# Patient Record
Sex: Male | Born: 1965
Health system: Southern US, Community
[De-identification: ages and names within clinical notes are randomized; demographics above are authoritative.]

## PROBLEM LIST (undated history)

## (undated) ENCOUNTER — Emergency Department (HOSPITAL_COMMUNITY): Admission: EM | Payer: Medicaid Other | Source: Home / Self Care

## (undated) DIAGNOSIS — S83242A Other tear of medial meniscus, current injury, left knee, initial encounter: Secondary | ICD-10-CM

## (undated) DIAGNOSIS — R918 Other nonspecific abnormal finding of lung field: Secondary | ICD-10-CM

## (undated) DIAGNOSIS — I639 Cerebral infarction, unspecified: Secondary | ICD-10-CM

## (undated) DIAGNOSIS — K449 Diaphragmatic hernia without obstruction or gangrene: Secondary | ICD-10-CM

## (undated) DIAGNOSIS — S0990XA Unspecified injury of head, initial encounter: Secondary | ICD-10-CM

## (undated) DIAGNOSIS — K219 Gastro-esophageal reflux disease without esophagitis: Secondary | ICD-10-CM

## (undated) DIAGNOSIS — B1920 Unspecified viral hepatitis C without hepatic coma: Secondary | ICD-10-CM

## (undated) DIAGNOSIS — J439 Emphysema, unspecified: Secondary | ICD-10-CM

## (undated) DIAGNOSIS — I82409 Acute embolism and thrombosis of unspecified deep veins of unspecified lower extremity: Secondary | ICD-10-CM

## (undated) DIAGNOSIS — I10 Essential (primary) hypertension: Secondary | ICD-10-CM

## (undated) DIAGNOSIS — J9601 Acute respiratory failure with hypoxia: Secondary | ICD-10-CM

## (undated) HISTORY — DX: Other nonspecific abnormal finding of lung field: R91.8

## (undated) HISTORY — DX: Emphysema, unspecified: J43.9

## (undated) HISTORY — PX: FACIAL FRACTURE SURGERY: SHX1570

## (undated) HISTORY — DX: Essential (primary) hypertension: I10

---

## 2000-12-07 ENCOUNTER — Emergency Department (HOSPITAL_COMMUNITY): Admission: EM | Admit: 2000-12-07 | Discharge: 2000-12-07 | Payer: Self-pay | Admitting: Emergency Medicine

## 2001-06-26 ENCOUNTER — Emergency Department (HOSPITAL_COMMUNITY): Admission: EM | Admit: 2001-06-26 | Discharge: 2001-06-26 | Payer: Self-pay | Admitting: Emergency Medicine

## 2001-07-09 ENCOUNTER — Emergency Department (HOSPITAL_COMMUNITY): Admission: EM | Admit: 2001-07-09 | Discharge: 2001-07-09 | Payer: Self-pay | Admitting: Internal Medicine

## 2002-11-10 ENCOUNTER — Emergency Department (HOSPITAL_COMMUNITY): Admission: EM | Admit: 2002-11-10 | Discharge: 2002-11-10 | Payer: Self-pay | Admitting: Emergency Medicine

## 2004-05-20 ENCOUNTER — Ambulatory Visit: Payer: Self-pay | Admitting: Internal Medicine

## 2005-04-30 ENCOUNTER — Emergency Department (HOSPITAL_COMMUNITY): Admission: EM | Admit: 2005-04-30 | Discharge: 2005-04-30 | Payer: Self-pay | Admitting: Emergency Medicine

## 2013-11-23 ENCOUNTER — Encounter (HOSPITAL_COMMUNITY): Payer: Self-pay | Admitting: Emergency Medicine

## 2013-11-23 ENCOUNTER — Inpatient Hospital Stay (HOSPITAL_COMMUNITY)
Admission: EM | Admit: 2013-11-23 | Discharge: 2013-11-28 | DRG: 603 | Disposition: A | Discharge status: Home / Self Care | Payer: Self-pay | Attending: Internal Medicine | Admitting: Internal Medicine

## 2013-11-23 ENCOUNTER — Emergency Department (HOSPITAL_COMMUNITY): Payer: Self-pay

## 2013-11-23 DIAGNOSIS — Z79899 Other long term (current) drug therapy: Secondary | ICD-10-CM

## 2013-11-23 DIAGNOSIS — R945 Abnormal results of liver function studies: Secondary | ICD-10-CM

## 2013-11-23 DIAGNOSIS — L03119 Cellulitis of unspecified part of limb: Secondary | ICD-10-CM

## 2013-11-23 DIAGNOSIS — R7989 Other specified abnormal findings of blood chemistry: Secondary | ICD-10-CM

## 2013-11-23 DIAGNOSIS — B1710 Acute hepatitis C without hepatic coma: Secondary | ICD-10-CM

## 2013-11-23 DIAGNOSIS — B1920 Unspecified viral hepatitis C without hepatic coma: Secondary | ICD-10-CM | POA: Diagnosis present

## 2013-11-23 DIAGNOSIS — F17210 Nicotine dependence, cigarettes, uncomplicated: Secondary | ICD-10-CM | POA: Diagnosis present

## 2013-11-23 DIAGNOSIS — Z23 Encounter for immunization: Secondary | ICD-10-CM

## 2013-11-23 DIAGNOSIS — L03114 Cellulitis of left upper limb: Principal | ICD-10-CM | POA: Diagnosis present

## 2013-11-23 DIAGNOSIS — L0390 Cellulitis, unspecified: Secondary | ICD-10-CM | POA: Diagnosis present

## 2013-11-23 LAB — BASIC METABOLIC PANEL
ANION GAP: 10 (ref 5–15)
BUN: 11 mg/dL (ref 6–23)
CO2: 26 mEq/L (ref 19–32)
CREATININE: 0.89 mg/dL (ref 0.50–1.35)
Calcium: 9.4 mg/dL (ref 8.4–10.5)
Chloride: 102 mEq/L (ref 96–112)
GFR calc Af Amer: >90 mL/min (ref >90)
GFR calc non Af Amer: >90 mL/min (ref >90)
Glucose, Bld: 103 mg/dL — ABNORMAL HIGH (ref 70–99)
Potassium: 4.1 mEq/L (ref 3.7–5.3)
SODIUM: 138 meq/L (ref 137–147)

## 2013-11-23 LAB — CBC WITH DIFFERENTIAL/PLATELET
BASOS PCT: 0 % (ref 0–1)
Basophils Absolute: 0 10*3/uL (ref 0.0–0.1)
Eosinophils Absolute: 0.1 10*3/uL (ref 0.0–0.7)
Eosinophils Relative: 1 % (ref 0–5)
HCT: 44 % (ref 39.0–52.0)
Hemoglobin: 15.1 g/dL (ref 13.0–17.0)
LYMPHS ABS: 2.2 10*3/uL (ref 0.7–4.0)
Lymphocytes Relative: 16 % (ref 12–46)
MCH: 32.7 pg (ref 26.0–34.0)
MCHC: 34.3 g/dL (ref 30.0–36.0)
MCV: 95.2 fL (ref 78.0–100.0)
MONO ABS: 1.4 10*3/uL — AB (ref 0.1–1.0)
Monocytes Relative: 10 % (ref 3–12)
NEUTROS ABS: 10.4 10*3/uL — AB (ref 1.7–7.7)
Neutrophils Relative %: 73 % (ref 43–77)
PLATELETS: 327 10*3/uL (ref 150–400)
RBC: 4.62 MIL/uL (ref 4.22–5.81)
RDW: 14.1 % (ref 11.5–15.5)
WBC: 14.2 10*3/uL — AB (ref 4.0–10.5)

## 2013-11-23 MED ORDER — HYDROMORPHONE HCL 1 MG/ML IJ SOLN
1.0000 mg | Freq: Once | INTRAMUSCULAR | Status: AC
  Administered 2013-11-23: 1 mg via INTRAVENOUS
  Filled 2013-11-23: qty 1

## 2013-11-23 MED ORDER — ONDANSETRON HCL 4 MG/2ML IJ SOLN
4.0000 mg | Freq: Once | INTRAMUSCULAR | Status: AC
  Administered 2013-11-23: 4 mg via INTRAMUSCULAR
  Filled 2013-11-23: qty 2

## 2013-11-23 MED ORDER — VANCOMYCIN HCL IN DEXTROSE 1-5 GM/200ML-% IV SOLN
1000.0000 mg | Freq: Two times a day (BID) | INTRAVENOUS | Status: DC
  Administered 2013-11-23 – 2013-11-24 (×3): 1000 mg via INTRAVENOUS
  Filled 2013-11-23 (×7): qty 200

## 2013-11-23 MED ORDER — PIPERACILLIN-TAZOBACTAM IN DEX 2-0.25 GM/50ML IV SOLN
2.2500 g | Freq: Three times a day (TID) | INTRAVENOUS | Status: DC
  Administered 2013-11-23 – 2013-11-24 (×2): 2.25 g via INTRAVENOUS
  Filled 2013-11-23 (×9): qty 50

## 2013-11-23 MED ORDER — IOHEXOL 300 MG/ML  SOLN
100.0000 mL | Freq: Once | INTRAMUSCULAR | Status: AC | PRN
  Administered 2013-11-23: 100 mL via INTRAVENOUS

## 2013-11-23 MED ORDER — PIPERACILLIN-TAZOBACTAM 3.375 G IVPB 30 MIN
3.3750 g | Freq: Once | INTRAVENOUS | Status: AC
  Administered 2013-11-23: 3.375 g via INTRAVENOUS
  Filled 2013-11-23: qty 50

## 2013-11-23 MED ORDER — PIPERACILLIN-TAZOBACTAM IN DEX 2-0.25 GM/50ML IV SOLN
INTRAVENOUS | Status: AC
  Filled 2013-11-23: qty 50

## 2013-11-23 MED ORDER — VANCOMYCIN HCL IN DEXTROSE 1-5 GM/200ML-% IV SOLN
INTRAVENOUS | Status: AC
  Filled 2013-11-23: qty 200

## 2013-11-23 MED ORDER — HYDROMORPHONE HCL 1 MG/ML IJ SOLN
1.0000 mg | INTRAMUSCULAR | Status: DC | PRN
  Administered 2013-11-23 – 2013-11-24 (×6): 1 mg via INTRAVENOUS
  Filled 2013-11-23 (×6): qty 1

## 2013-11-23 MED ORDER — ONDANSETRON HCL 4 MG/2ML IJ SOLN: 4.0000 mg | Freq: Four times a day (QID) | INTRAMUSCULAR | Status: DC | PRN

## 2013-11-23 MED ORDER — PNEUMOCOCCAL VAC POLYVALENT 25 MCG/0.5ML IJ INJ
0.5000 mL | INJECTION | INTRAMUSCULAR | Status: AC
  Administered 2013-11-24: 0.5 mL via INTRAMUSCULAR
  Filled 2013-11-23: qty 0.5

## 2013-11-23 MED ORDER — SODIUM CHLORIDE 0.9 % IV SOLN
INTRAVENOUS | Status: DC
  Administered 2013-11-23 – 2013-11-24 (×2): via INTRAVENOUS

## 2013-11-23 MED ORDER — ONDANSETRON HCL 4 MG PO TABS: 4.0000 mg | ORAL_TABLET | Freq: Four times a day (QID) | ORAL | Status: DC | PRN

## 2013-11-23 MED ORDER — ENOXAPARIN SODIUM 40 MG/0.4ML ~~LOC~~ SOLN
40.0000 mg | SUBCUTANEOUS | Status: DC
  Administered 2013-11-23: 40 mg via SUBCUTANEOUS
  Filled 2013-11-23: qty 0.4

## 2013-11-23 MED ORDER — HYDROMORPHONE HCL 1 MG/ML IJ SOLN
INTRAMUSCULAR | Status: AC
  Administered 2013-11-23: 17:00:00
  Filled 2013-11-23: qty 1

## 2013-11-23 NOTE — ED Notes (Signed)
Lt hand and forearm swollen , hot , tender.  Bitten by ?spider 6 days ago.  To wrist.Pt says pus coming out of site of bite.  Good radial pulse..Marland Kitchen

## 2013-11-23 NOTE — ED Provider Notes (Signed)
CSN: [id number]     Arrival date & time 11/23/13  1200 History   First MD Initiated Contact with Patient 11/23/13 1341     Chief Complaint  Patient presents with  . Insect Bite     (Consider location/radiation/quality/duration/timing/severity/associated sxs/prior Treatment) HPI Comments: Patient is a 48 year old male who presents to the emergency department with the complaint of swelling of his left hand and forearm. Patient states that on Saturday, October 10 he was bitten by something. He is not sure what he was bit by. He had some pain on Saturday night, the pain continued to progress up until this time. On yesterday the patient states that he had severe pain and could hardly use his left hand. Today the pain was worse. He states that he soak the area and very warm or and got some mild drainage from the bite site. No other bites reported. The patient states that he has noticed gradual swelling from his back of his hand to his forearm near his elbow. He has had chills,  he has not measured a temperature elevation. He has tried over-the-counter medications for his pain, but states this is not helping at all.  The history is provided by the patient.    History reviewed. No pertinent past medical history. Past Surgical History  Procedure Laterality Date  . Facial fracture surgery     History reviewed. No pertinent family history. History  Substance Use Topics  . Smoking status: Current Every Day Smoker -- 2.00 packs/day  . Smokeless tobacco: Not on file  . Alcohol Use: No    Review of Systems  Constitutional: Positive for chills. Negative for fever and activity change.       All ROS Neg except as noted in HPI  HENT: Negative for nosebleeds.   Eyes: Negative for photophobia and discharge.  Respiratory: Negative for cough, shortness of breath and wheezing.   Cardiovascular: Negative for chest pain and palpitations.  Gastrointestinal: Negative for abdominal pain and blood in stool.    Genitourinary: Negative for dysuria, frequency and hematuria.  Musculoskeletal: Negative for arthralgias, back pain and neck pain.  Skin: Positive for wound.  Neurological: Negative for dizziness, seizures and speech difficulty.  Psychiatric/Behavioral: Negative for hallucinations and confusion.      Allergies  Review of patient's allergies indicates not on file.  Home Medications   Prior to Admission medications   Not on File   BP 146/98  Pulse 100  Temp(Src) 99.1 F (37.3 C) (Oral)  Resp 16  Ht 5\' 10"  (1.778 m)  Wt 165 lb (74.844 kg)  BMI 23.68 kg/m2  SpO2 99% Physical Exam  Nursing note and vitals reviewed. Constitutional: He is oriented to person, place, and time. He appears well-developed and well-nourished.  Non-toxic appearance.  HENT:  Head: Normocephalic.  Right Ear: Tympanic membrane and external ear normal.  Left Ear: Tympanic membrane and external ear normal.  Eyes: EOM and lids are normal. Pupils are equal, round, and reactive to light.  Neck: Normal range of motion. Neck supple. Carotid bruit is not present.  Cardiovascular: Regular rhythm, normal heart sounds, intact distal pulses and normal pulses.  Tachycardia present.   Pulmonary/Chest: Breath sounds normal. No respiratory distress.  Abdominal: Soft. Bowel sounds are normal. There is no tenderness. There is no guarding.  Musculoskeletal: Normal range of motion.  There is good range of motion of the left shoulder and elbow. There is swelling of from the forearm to the PIP joints of the fingers of the  left hand. There is significant swelling of the dorsum of the left hand and wrist. There is an open wound to the radial aspect of the left wrist with minimal drainage. There is increased redness and warmth of the wrist and forearm, with significant tenderness. Radial pulses 2+. Brachial pulses 2+.  Lymphadenopathy:       Head (right side): No submandibular adenopathy present.       Head (left side): No  submandibular adenopathy present.    He has no cervical adenopathy.  Neurological: He is alert and oriented to person, place, and time. He has normal strength. No cranial nerve deficit or sensory deficit.  Skin: Skin is warm and dry.  Psychiatric: He has a normal mood and affect. His speech is normal.        ED Course Case reviewed by Dr [name]. IV Zosyn and IV dilaudid ordered. Case discussed with hospitalist  - Dr Irene LimboGoodrich. CT with contrast ordered.  No drainable abscess noted on CT. Dr Sharl MaLama will admit the patient.  Procedures (including critical care time) Labs Review Labs Reviewed  CBC WITH DIFFERENTIAL - Abnormal; Notable for the following:    WBC 14.2 (*)    Neutro Abs 10.4 (*)    Monocytes Absolute 1.4 (*)    All other components within normal limits  BASIC METABOLIC PANEL - Abnormal; Notable for the following:    Glucose, Bld 103 (*)    All other components within normal limits    Imaging Review No results found.   EKG Interpretation None      MDM  Temperature is 99.1, pulse 100, blood pressure slightly elevated at 146/98. The examination is consistent with a cellulitis involving the left hand wrist/forearm. The complete blood count reveals a white blood cell count 14,200 elevated. The basic metabolic panel is well within normal limits. The CT of the upper extremity with contrast shows no osseous abnormalities. There is no evidence of osteomyelitis. There is no foreign body appreciated, and no soft tissue gas. There is no consolidation of fluids consistent with abscess. There is severe diffuse cellulitis.  IV antibiotics ordered. Pt to be admitted.   Final diagnoses:  None    *I have reviewed nursing notes, vital signs, and all appropriate lab and imaging results for this patient.Kathie Dike**    Zaccary Creech M Shabana Armentrout, PA-C 11/25/13 2130

## 2013-11-23 NOTE — Progress Notes (Signed)
ANTIBIOTIC CONSULT NOTE  Pharmacy Consult for Vancomycin and Zosyn  Indication: cellulitis   No Known Allergies  Patient Measurements: Height: 5\' 10"  (177.8 cm) Weight: 165 lb (74.844 kg) IBW/kg (Calculated) : 73  Vital Signs: Temp: 98.4 F (36.9 C) (10/16 1829) Temp Source: Oral (10/16 1829) BP: 123/74 mmHg (10/16 1829) Pulse Rate: 85 (10/16 1829) Intake/Output from previous day:   Intake/Output from this shift:    Labs:  Recent Labs  11/23/13 1244  WBC 14.2*  HGB 15.1  PLT 327  CREATININE 0.89   Estimated Creatinine Clearance: 103.7 ml/min (by C-G formula based on Cr of 0.89). No results found for this basename: VANCOTROUGH, VANCOPEAK, VANCORANDOM, GENTTROUGH, GENTPEAK, GENTRANDOM, TOBRATROUGH, TOBRAPEAK, TOBRARND, AMIKACINPEAK, AMIKACINTROU, AMIKACIN,  in the last 72 hours   Microbiology: No results found for this or any previous visit (from the past 720 hour(s)).  Anti-infectives   Start     Dose/Rate Route Frequency Ordered Stop   11/23/13 1430  piperacillin-tazobactam (ZOSYN) IVPB 3.375 g     3.375 g 100 mL/hr over 30 Minutes Intravenous  Once 11/23/13 1422 11/23/13 1530     Assessment: Okay for Protocol, potential recent spider bite.  Goal of Therapy:  Vancomycin trough level 10-15 mcg/ml  Plan:  Zosyn 3.375gm IV every 8 hours. Follow-up micro data, labs, vitals.  Vancomycin 1gm IV every 12 hours. Measure antibiotic drug levels at steady state Follow up culture results  Mady GemmaHayes, [place] R 11/23/2013,8:26 PM

## 2013-11-23 NOTE — ED Notes (Signed)
Report called to [name] on 3A , pt taken to 301

## 2013-11-23 NOTE — ED Notes (Signed)
Pt reports was "bitten by something last Saturday." pt denies any fever,n/v,abdominal pain. nad noted. Localized swelling/redness noted to bite site. Dried drainage noted to site. Distal pulses strong. Equal warmth noted in LUE. Bite site noted to side of left wrist.

## 2013-11-23 NOTE — ED Notes (Signed)
Alert, Nad, repeat pain med given

## 2013-11-23 NOTE — H&P (Signed)
PCP:   No primary provider on file.   Chief Complaint:  Left hand swelling and pain  HPI: 48 year old male with no significant medical problems, who came to the ED with swelling of left hand. Patient says that on Saturday while taking off his shoes at picnic table he was bitten by something, not sure whether it was spider. Yesterday patient started having pain and the swelling started worsening. Today he noticed that the swelling gradually became worse and he was unable to bend his fingers. He denies fever, no chills, he denies numbness or tingling in the hand. The pain gets worse in the hand is lowered, and says the pain is 10 out of 10 in intensity. In the ED patient was found to have a limited white count, CT left arm and hand was done which shows severe diffuse cellulitis and no specific evidence for necrotizing fasciitis.  Allergies:  No Known Allergies   History reviewed. No pertinent past medical history.  Past Surgical History  Procedure Laterality Date  . Facial fracture surgery      Prior to Admission medications   Medication Sig Start Date End Date Taking? Authorizing Provider  diphenhydrAMINE (BENADRYL) 25 mg capsule Take 50 mg by mouth every 6 (six) hours as needed for itching.   Yes Historical Provider, MD  ranitidine (ZANTAC) 75 MG tablet Take 75 mg by mouth 2 (two) times daily.   Yes Historical Provider, MD    Social History:  reports that he has been smoking.  He does not have any smokeless tobacco history on file. He reports that he does not drink alcohol or use illicit drugs.  History reviewed. No pertinent family history.   All the positives are listed in BOLD  Review of Systems:  HEENT: Headache, blurred vision, runny nose, sore throat Neck: Hypothyroidism, hyperthyroidism,,lymphadenopathy Chest : Shortness of breath, history of COPD, Asthma Heart : Chest pain, history of coronary arterey disease GI:  Nausea, vomiting, diarrhea, constipation, GERD GU:  Dysuria, urgency, frequency of urination, hematuria Neuro: Stroke, seizures, syncope Psych: Depression, anxiety, hallucinations   Physical Exam: Blood pressure 116/85, pulse 82, temperature 98.6 F (37 C), temperature source Oral, resp. rate 18, height 5\' 10"  (1.778 m), weight 74.844 kg (165 lb), SpO2 100.00%. Constitutional:   Patient is a well-developed and well-nourished *male in no acute distress and cooperative with exam. Head: Normocephalic and atraumatic Mouth: Mucus membranes moist Eyes: PERRL, EOMI, conjunctivae normal Neck: Supple, No Thyromegaly Cardiovascular: RRR, S1 normal, S2 normal Pulmonary/Chest: CTAB, no wheezes, rales, or rhonchi Abdominal: Soft. Non-tender, non-distended, bowel sounds are normal, no masses, organomegaly, or guarding present.  Neurological: A&O x3, Strenght is normal and symmetric bilaterally, cranial nerve II-XII are grossly intact, no focal motor deficit, sensory intact to light touch bilaterally.  Extremities : Left hand has significant edema, with open area noted on the ulnar aspect just above the wrist. Positive tenderness to palpation. Range of motion is limited due to swelling, sensations are intact.  Labs on Admission:  Basic Metabolic Panel:  Recent Labs Lab 11/23/13 1244  NA 138  K 4.1  CL 102  CO2 26  GLUCOSE 103*  BUN 11  CREATININE 0.89  CALCIUM 9.4   Liver Function Tests: No results found for this basename: AST, ALT, ALKPHOS, BILITOT, PROT, ALBUMIN,  in the last 168 hours No results found for this basename: LIPASE, AMYLASE,  in the last 168 hours No results found for this basename: AMMONIA,  in the last 168 hours CBC:  Recent Labs Lab 11/23/13 1244  WBC 14.2*  NEUTROABS 10.4*  HGB 15.1  HCT 44.0  MCV 95.2  PLT 327    Radiological Exams on Admission: Ct Extrem Up Entire Arm L W/cm  11/23/2013   CLINICAL DATA:  Initial evaluation for spider bite 2 left wrist and thumb 6 days ago, with swelling to left hand left  wrist left forearm, redness and drainage from the bite area, findings consistent with cellulitis  EXAM: CT OF THE UPPER LEFT ARM WITH CONTRAST  TECHNIQUE: Spiral imaging of the upper extremity from the level of the elbow through the hand following intravenous contrast administration  CONTRAST:  100mL OMNIPAQUE IOHEXOL 300 MG/ML  SOLN  COMPARISON:  None.  FINDINGS: There are no osseous abnormalities. There is no evidence of osteomyelitis or periosteal reaction. There is severe soft tissue edematous and inflammatory change over the dorsal aspect of the metacarpals extending into the wrist and throughout the entire forearm. There is no focal fluid collection. To a lesser degree inflammatory change extends into the fingers. No opaque foreign bodies identified.No soft tissue gas. There is evidence of inflammatory change extending into the intramuscular compartments of the forearm and wrist.  IMPRESSION: Severe diffuse cellulitis.No specific evidence for necrotizing fasciitis, although that is a difficult possibility to exclude radiographically.   Electronically Signed   By: Esperanza Heiraymond  Rubner M.D.   On: 11/23/2013 16:21      Assessment/Plan Active Problems:   Cellulitis  Cellulitis of left hand CT scan does not show any abscess, on my exam patient does not have sensory loss. We'll admit the patient and start vancomycin and Zosyn. Blood cultures x2 will be obtained. Once patient starts improving, he can be switched to oral antibiotics.  Code status: Patient is full code  Family discussion: No family at bedside   Time Spent on Admission: 60 minutes  Shalita Notte S [facility] Pager: [phone] 11/23/2013, 6:03 PM  If 7PM-7AM, please contact night-coverage  [email or web address]  Password [email or web address]

## 2013-11-24 ENCOUNTER — Encounter (HOSPITAL_COMMUNITY): Payer: Self-pay

## 2013-11-24 LAB — COMPREHENSIVE METABOLIC PANEL
ALK PHOS: 96 U/L (ref 39–117)
ALT: 212 U/L — AB (ref 0–53)
AST: 136 U/L — ABNORMAL HIGH (ref 0–37)
Albumin: 3 g/dL — ABNORMAL LOW (ref 3.5–5.2)
Anion gap: 9 (ref 5–15)
BUN: 9 mg/dL (ref 6–23)
CALCIUM: 8.9 mg/dL (ref 8.4–10.5)
CO2: 26 meq/L (ref 19–32)
Chloride: 103 mEq/L (ref 96–112)
Creatinine, Ser: 0.87 mg/dL (ref 0.50–1.35)
GFR calc non Af Amer: >90 mL/min (ref >90)
GLUCOSE: 85 mg/dL (ref 70–99)
POTASSIUM: 4 meq/L (ref 3.7–5.3)
Sodium: 138 mEq/L (ref 137–147)
Total Bilirubin: 0.6 mg/dL (ref 0.3–1.2)
Total Protein: 6.5 g/dL (ref 6.0–8.3)

## 2013-11-24 LAB — CBC
HCT: 41.1 % (ref 39.0–52.0)
HEMOGLOBIN: 14.1 g/dL (ref 13.0–17.0)
MCH: 32.8 pg (ref 26.0–34.0)
MCHC: 34.3 g/dL (ref 30.0–36.0)
MCV: 95.6 fL (ref 78.0–100.0)
Platelets: 306 10*3/uL (ref 150–400)
RBC: 4.3 MIL/uL (ref 4.22–5.81)
RDW: 14 % (ref 11.5–15.5)
WBC: 10.8 10*3/uL — ABNORMAL HIGH (ref 4.0–10.5)

## 2013-11-24 LAB — HEPATITIS PANEL, ACUTE
HCV Ab: REACTIVE — AB
Hep A IgM: NONREACTIVE
Hep B C IgM: NONREACTIVE
Hepatitis B Surface Ag: NEGATIVE

## 2013-11-24 LAB — HIV ANTIBODY (ROUTINE TESTING W REFLEX): HIV 1&2 Ab, 4th Generation: NONREACTIVE

## 2013-11-24 MED ORDER — PIPERACILLIN-TAZOBACTAM IN DEX 2-0.25 GM/50ML IV SOLN
INTRAVENOUS | Status: AC
  Filled 2013-11-24: qty 50

## 2013-11-24 MED ORDER — ACETAMINOPHEN 325 MG PO TABS
650.0000 mg | ORAL_TABLET | Freq: Four times a day (QID) | ORAL | Status: DC | PRN
  Administered 2013-11-26 (×2): 650 mg via ORAL
  Filled 2013-11-24 (×2): qty 2

## 2013-11-24 MED ORDER — HYDROMORPHONE HCL 1 MG/ML IJ SOLN
1.0000 mg | INTRAMUSCULAR | Status: DC | PRN
  Administered 2013-11-24 – 2013-11-26 (×11): 1 mg via INTRAVENOUS
  Filled 2013-11-24 (×11): qty 1

## 2013-11-24 MED ORDER — PIPERACILLIN-TAZOBACTAM 3.375 G IVPB
3.3750 g | Freq: Three times a day (TID) | INTRAVENOUS | Status: DC
  Administered 2013-11-24 – 2013-11-28 (×10): 3.375 g via INTRAVENOUS
  Filled 2013-11-24 (×19): qty 50

## 2013-11-24 NOTE — Progress Notes (Signed)
301- Carollee MassedEnglish, [place]-   Report was given to Jarrett Ablesachel Muthomi, RN @ 19:41. [facility] by [facility].

## 2013-11-24 NOTE — Plan of Care (Signed)
Pt arrived from [facility] to see. [facility] made aware of arrival in event needs to physically see pt. RN stated pt with inadequate pain control on current dose of Dilaudid so dose increased to 1mg  every 2 hours with orders to hold if sedated.  Junious SilkAllison Ellis ANP

## 2013-11-24 NOTE — Plan of Care (Signed)
Problem: Discharge Progression Outcomes Goal: Pain controlled with appropriate interventions Outcome: Completed/Met Date Met:  11/24/13 Pt pain controlled with PRN medication (dilaudid) IV. When PRN is taken, pain score (0-10) decreases by 2 points or more.

## 2013-11-24 NOTE — Progress Notes (Signed)
Utilization review Completed Antrice Pal RN BSN   

## 2013-11-24 NOTE — Progress Notes (Signed)
[facility] progress note. Chief complaint. Transfer note. History of present illness. This 48 year old male at [facility] with cellulitis left hand or wrist, and forearm without evidence of complicating features her CT scan. Suspected abscess development on her aspect of the forearm. Patient felt to require transfer to [facility] to allow consult with Doctor Izora Ribasoley of hand surgery. The patient has now arrived in transfer and I'm seeing him at bedside to ensure he remained clinically stable and that his orders transferred appropriately. Patient has no current complaints. Physical exam. Vital signs. Temperature 90.8, pulse 80, respiration 18, blood pressure 124/81. O2 sats 98%. General appearance. Well-developed male who is alert and in no distress. Cardiac. Rate and rhythm regular. Lungs. Breath sounds clear and equal. Abdomen. Soft with positive bowel sounds. No pain. Extremities. Left upper extremity presents with a gauze wrap manage over the hand and forearm. There is some edema in the fingers and erythema extending beyond the gauze wrap towards the elbow. Patient's range of motion strength is intact. Capillary refill is intact. No current evidence of compartment syndrome. Impression/plan. Problem #1. Cellulitis left hand, wrist, forearm. Ongoing antibiotic therapy with vancomycin. Patient will receive consult by hand surgery in the a.m. No evidence of compartment syndrome. Problem #2. Elevated transaminases. Continue to monitor. Workup in process hepatitis panel and HIV screen. Patient is clinically stable post transfer. All orders appear to have transferred appropriately.

## 2013-11-24 NOTE — Progress Notes (Addendum)
PROGRESS NOTE  Carollee MassedJames Inabinet ZOX:[id number] DOB: 06/05/1965 DOA: 11/23/2013 PCP: No primary provider on file.  Summary: 48yom presented with left lower arm and hand pain x1 week after bug or spider bite. Noted to have severe swelling and cellulitis in ED. Imaging without complicating features, admitted for IV abx.   Assessment/Plan: 1. Cellulitis left hand, wrist, forearm without evidence of complicating features on CT. No subjective improvement. Suspect abscess developing ulnar aspect of forearm. No signs of compartment syndrome but concern for extension into hand. 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 Per patient "only thing that works is gasoline". 2. Elevated transminases   Continue IV abx, elevate arm and hand. Obtain culture. Will discuss with hand surgeon for further recommendations--discussed with Dr. Izora Ribasoley hand surgery, [facility]. He will see in consult. NPO for now.  Check hepatitis panel, HIV screen  Code Status: full code DVT prophylaxis: heparin Family Communication: none present Disposition Plan: home  Brendia Sacksaniel Goodrich, MD  [facility]  Pager [phone] If 7PM-7AM, please contact night-coverage at [email or web address], password [email or web address] 11/24/2013, 1:05 PM  LOS: 1 day   Consultants:    Procedures:    Antibiotics:  Vancomycin 10/16 >>  Zosyn 10/16 >>  HPI/Subjective: No change in hand swelling or forearm swelling. Has significant pain lower forearm, wrist and hand. No pain upper arm. Area over wrist increasing in size, draining fluid. Sensation intact in arm and hand, ROM limited by swelling an pain.  Objective: Filed Vitals:   11/23/13 1702 11/23/13 1829 11/23/13 2131 11/24/13 0624  BP: 116/85 123/74 122/87 115/77  Pulse: 82 85 78 83  Temp: 98.6 F (37 C) 98.4 F (36.9 C) 98.2 F (36.8 C) 98.1 F (36.7 C)  TempSrc: Oral Oral Oral Oral  Resp: 18 18 16 20   Height:      Weight:   73.1 kg  (161 lb 2.5 oz)   SpO2: 100% 98% 99% 97%    Intake/Output Summary (Last 24 hours) at 11/24/13 1305 Last data filed at 11/24/13 0900  Gross per 24 hour  Intake   1140 ml  Output      0 ml  Net   1140 ml     Filed Weights   11/23/13 1212 11/23/13 2131  Weight: 74.844 kg (165 lb) 73.1 kg (161 lb 2.5 oz)    Exam:     Afebrile, VSS, no hypoxia   General: appears calm and comfortable  Psych: alert, speech fluent and clear  CV: RRR no m/r/g. No LE edema  Respiratory: CTA bilaterally no w/r/r. Normal resp effort  Left arm. Shoulder and upper arm unremarkable in appearance, non-tender. Elbow unremarkable. Slight erythema of forearm. Hand discolored secondary to occupation. Ulnar aspect proximal to wrist appears to be bigger in size compared to pictures from yesterday, tender, fluctuant, draining pus. Hand edematous, no erythema, ROM all fingers appears intact, limited by swelling, no pain in hands. Sensation intact at finger tips. Strong radial pulse. Arm and hand pink.            Data Reviewed:  BMP unremarkable  Elevated AST, ALT  WBC down to 10.8  BC pending   Scheduled Meds: . enoxaparin (LOVENOX) injection  40 mg Subcutaneous Q24H  . piperacillin-tazobactam (ZOSYN)  IV  3.375 g Intravenous 3 times per day  . vancomycin  1,000 mg Intravenous Q12H   Continuous Infusions: . sodium chloride 75 mL/hr at 11/24/13 1035    Active Problems:  Cellulitis   Time spent 35 minutes greater than 50% in counseling and coordination of care

## 2013-11-25 DIAGNOSIS — R7989 Other specified abnormal findings of blood chemistry: Secondary | ICD-10-CM

## 2013-11-25 MED ORDER — SODIUM CHLORIDE 0.9 % IV SOLN
INTRAVENOUS | Status: DC
  Administered 2013-11-26: 10:00:00 via INTRAVENOUS

## 2013-11-25 MED ORDER — VANCOMYCIN HCL IN DEXTROSE 1-5 GM/200ML-% IV SOLN
1000.0000 mg | Freq: Three times a day (TID) | INTRAVENOUS | Status: DC
  Administered 2013-11-25 – 2013-11-28 (×9): 1000 mg via INTRAVENOUS
  Filled 2013-11-25 (×11): qty 200

## 2013-11-25 MED ORDER — ENOXAPARIN SODIUM 40 MG/0.4ML ~~LOC~~ SOLN
40.0000 mg | SUBCUTANEOUS | Status: DC
  Administered 2013-11-25 – 2013-11-27 (×3): 40 mg via SUBCUTANEOUS
  Filled 2013-11-25 (×5): qty 0.4

## 2013-11-25 NOTE — Progress Notes (Signed)
[facility] PROGRESS NOTE  Clifford MassedJames Chan NWG:[id number] DOB: 10/17/1965 DOA: 11/23/2013 PCP: No primary provider on file.  Assessment/Plan: 48yom presented with left lower arm and hand pain x1 week after bug or spider bite. Noted to have severe swelling and cellulitis in ED. Imaging without complicating features, admitted for IV abx.   1. Cellulitis left hand, wrist, forearm without evidence of complicating features on CT. -afebrile;+drainign wound on exam, obtain cultures; cont IV atx, awaiting hand surgeon evaluation   2. Elevated transminases' + hep c; (HIV, Heb B negative) -check hep c viral load   Code Status: full Family Communication:  D/w patient (indicate person spoken with, relationship, and if by phone, the number) Disposition Plan: home pend clinical improvement    Consultants:  ortho  Procedures:  none  Antibiotics: Vancomycin 10/16 >>  Zosyn 10/16 >>    (indicate start date, and stop date if known)  HPI/Subjective: alert  Objective: Filed Vitals:   11/25/13 0556  BP: 112/63  Pulse: 79  Temp: 97.9 F (36.6 C)  Resp: 17    Intake/Output Summary (Last 24 hours) at 11/25/13 1017 Last data filed at 11/24/13 1440  Gross per 24 hour  Intake   1050 ml  Output      0 ml  Net   1050 ml   Filed Weights   11/23/13 1212 11/23/13 2131 11/24/13 2038  Weight: 74.844 kg (165 lb) 73.1 kg (161 lb 2.5 oz) 72.6 kg (160 lb 0.9 oz)    Exam:   General:  alert  Cardiovascular: s1,s2 rrr  Respiratory: CTA BL  Abdomen: soft, nt,nt   Musculoskeletal: L hand draining wound    Data Reviewed: Basic Metabolic Panel:  Recent Labs Lab 11/23/13 1244 11/24/13 0549  NA 138 138  K 4.1 4.0  CL 102 103  CO2 26 26  GLUCOSE 103* 85  BUN 11 9  CREATININE 0.89 0.87  CALCIUM 9.4 8.9   Liver Function Tests:  Recent Labs Lab 11/24/13 0549  AST 136*  ALT 212*  ALKPHOS 96  BILITOT 0.6  PROT 6.5  ALBUMIN 3.0*   No results found for this basename:  LIPASE, AMYLASE,  in the last 168 hours No results found for this basename: AMMONIA,  in the last 168 hours CBC:  Recent Labs Lab 11/23/13 1244 11/24/13 0549  WBC 14.2* 10.8*  NEUTROABS 10.4*  --   HGB 15.1 14.1  HCT 44.0 41.1  MCV 95.2 95.6  PLT 327 306   Cardiac Enzymes: No results found for this basename: CKTOTAL, CKMB, CKMBINDEX, TROPONINI,  in the last 168 hours BNP (last 3 results) No results found for this basename: PROBNP,  in the last 8760 hours CBG: No results found for this basename: GLUCAP,  in the last 168 hours  Recent Results (from the past 240 hour(s))  CULTURE, BLOOD (ROUTINE X 2)     Status: None   Collection Time    11/23/13  6:12 PM      Result Value Ref Range Status   Specimen Description BLOOD LEFT ARM   Final   Special Requests BOTTLES DRAWN AEROBIC AND ANAEROBIC 12CC   Final   Culture NO GROWTH 2 DAYS   Final   Report Status PENDING   Incomplete  CULTURE, BLOOD (ROUTINE X 2)     Status: None   Collection Time    11/23/13  6:19 PM      Result Value Ref Range Status   Specimen Description BLOOD RIGHT HAND   Final  Special Requests BOTTLES DRAWN AEROBIC AND ANAEROBIC 8CC   Final   Culture NO GROWTH 2 DAYS   Final   Report Status PENDING   Incomplete     Studies: Ct Extrem Up Entire Arm L W/cm  11/23/2013   CLINICAL DATA:  Initial evaluation for spider bite 2 left wrist and thumb 6 days ago, with swelling to left hand left wrist left forearm, redness and drainage from the bite area, findings consistent with cellulitis  EXAM: CT OF THE UPPER LEFT ARM WITH CONTRAST  TECHNIQUE: Spiral imaging of the upper extremity from the level of the elbow through the hand following intravenous contrast administration  CONTRAST:  100mL OMNIPAQUE IOHEXOL 300 MG/ML  SOLN  COMPARISON:  None.  FINDINGS: There are no osseous abnormalities. There is no evidence of osteomyelitis or periosteal reaction. There is severe soft tissue edematous and inflammatory change over the  dorsal aspect of the metacarpals extending into the wrist and throughout the entire forearm. There is no focal fluid collection. To a lesser degree inflammatory change extends into the fingers. No opaque foreign bodies identified.No soft tissue gas. There is evidence of inflammatory change extending into the intramuscular compartments of the forearm and wrist.  IMPRESSION: Severe diffuse cellulitis.No specific evidence for necrotizing fasciitis, although that is a difficult possibility to exclude radiographically.   Electronically Signed   By: Esperanza Heiraymond  Rubner M.D.   On: 11/23/2013 16:21    Scheduled Meds: . enoxaparin (LOVENOX) injection  40 mg Subcutaneous Q24H  . piperacillin-tazobactam (ZOSYN)  IV  3.375 g Intravenous 3 times per day  . vancomycin  1,000 mg Intravenous Q8H   Continuous Infusions: . sodium chloride      Active Problems:   Cellulitis    Time spent: >35 minutes     Esperanza SheetsBURIEV, Reyaan Thoma N  [facility] Pager [phone]. If 7PM-7AM, please contact night-coverage at [email or web address], password [email or web address] 11/25/2013, 10:17 AM  LOS: 2 days

## 2013-11-25 NOTE — Progress Notes (Signed)
ANTIBIOTIC CONSULT NOTE - FOLLOW UP  Pharmacy Consult for vanc/zosyn Indication: cellulitis  No Known Allergies  Patient Measurements: Height: 5\' 10"  (177.8 cm) Weight: 160 lb 0.9 oz (72.6 kg) IBW/kg (Calculated) : 73   Vital Signs: Temp: 97.9 F (36.6 C) (10/18 0556) Temp Source: Oral (10/18 0556) BP: 112/63 mmHg (10/18 0556) Pulse Rate: 79 (10/18 0556) Intake/Output from previous day: 10/17 0701 - 10/18 0700 In: 1290 [P.O.:480; I.V.:560; IV Piggyback:250] Out: -  Intake/Output from this shift:    Labs:  Recent Labs  11/23/13 1244 11/24/13 0549  WBC 14.2* 10.8*  HGB 15.1 14.1  PLT 327 306  CREATININE 0.89 0.87   Estimated Creatinine Clearance: 105.5 ml/min (by C-G formula based on Cr of 0.87). No results found for this basename: VANCOTROUGH, Leodis BinetVANCOPEAK, VANCORANDOM, GENTTROUGH, GENTPEAK, GENTRANDOM, TOBRATROUGH, TOBRAPEAK, TOBRARND, AMIKACINPEAK, AMIKACINTROU, AMIKACIN,  in the last 72 hours   Microbiology: Recent Results (from the past 720 hour(s))  CULTURE, BLOOD (ROUTINE X 2)     Status: None   Collection Time    11/23/13  6:12 PM      Result Value Ref Range Status   Specimen Description BLOOD LEFT ARM   Final   Special Requests BOTTLES DRAWN AEROBIC AND ANAEROBIC 12CC   Final   Culture NO GROWTH 2 DAYS   Final   Report Status PENDING   Incomplete  CULTURE, BLOOD (ROUTINE X 2)     Status: None   Collection Time    11/23/13  6:19 PM      Result Value Ref Range Status   Specimen Description BLOOD RIGHT HAND   Final   Special Requests BOTTLES DRAWN AEROBIC AND ANAEROBIC 8CC   Final   Culture NO GROWTH 2 DAYS   Final   Report Status PENDING   Incomplete    Assessment: vanc/zosyn per Rx # 3, cellulitis left hand, wrist, and forearm without evidence of complicating features on CT scan. Suspected abscess developing on ulnar aspect of the forearm. 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 Per patient "only thing that works is gasoline".   Potential recent spider bite.  [facility] with Doctor Izora Ribasoley of hand surgery. WBC 10.8 on 10/17, Afebrile, BC x 2 no growth x 2 days.    10/16 BC x 2 ngtd  vanc 10/16>> Zosyn 10/16>>    Goal of Therapy:  Vancomycin trough level 10-15 mcg/ml  Plan:  -increase vancomycin to 1 gm IV q8h -continue zosyn 3.375 gm IV q8h, infuse each dose over 4 hrs -f/u renal fxn, wbc, temp, culture data -vanc level as needed Herby AbrahamMichelle T. Nolin Grell, Pharm.D. [phone] 11/25/2013 9:35 AM

## 2013-11-25 NOTE — Consult Note (Signed)
Reason for Consult:infection L writ Referring Physician: Hospitalist  CC:My hand hurts  HPI:  [name] is an 48 y.o. right handed male who presents with  Swelling, pain, redness of L wrist/forearm, began ~ 1 wk ago, ? Bitten by insect, seen at [facility], placed on abx, referred to [facility]  .    Currently, states that wrist began to significantly drain last pm, hand feels better since then Pain is rated at   10 /10 and is described as sharp/dull.  Pain is constant.  Pain is made better by rest/immobilization, worse with motion.   Associated signs/symptoms: pain, swelling, limited wrist, finger motion Previous treatment:    History reviewed. No pertinent past medical history.  Past Surgical History  Procedure Laterality Date  . Facial fracture surgery      History reviewed. No pertinent family history.  Social History:  reports that he has been smoking.  He does not have any smokeless tobacco history on file. He reports that he does not drink alcohol or use illicit drugs.  Allergies: No Known Allergies  Medications: I have reviewed the patient's current medications.  Results for orders placed during the hospital encounter of 11/23/13 (from the past 48 hour(s))  CULTURE, BLOOD (ROUTINE X 2)     Status: None   Collection Time    11/23/13  6:12 PM      Result Value Ref Range   Specimen Description BLOOD LEFT ARM     Special Requests BOTTLES DRAWN AEROBIC AND ANAEROBIC 12CC     Culture NO GROWTH 2 DAYS     Report Status PENDING    CULTURE, BLOOD (ROUTINE X 2)     Status: None   Collection Time    11/23/13  6:19 PM      Result Value Ref Range   Specimen Description BLOOD RIGHT HAND     Special Requests BOTTLES DRAWN AEROBIC AND ANAEROBIC 8CC     Culture NO GROWTH 2 DAYS     Report Status PENDING    HIV ANTIBODY (ROUTINE TESTING)     Status: None   Collection Time    11/24/13  5:46 AM      Result Value Ref Range   HIV 1&2 Ab, 4th Generation NONREACTIVE  NONREACTIVE   Comment: (NOTE)      A NONREACTIVE HIV Ag/Ab result does not exclude HIV infection since     the time frame for seroconversion is variable. If acute HIV infection     is suspected, a HIV-1 RNA Qualitative TMA test is recommended.     HIV-1/2 Antibody Diff         Not indicated.     HIV-1 RNA, Qual TMA           Not indicated.     PLEASE NOTE: This information has been disclosed to you from records     whose confidentiality may be protected by state law. If your state     requires such protection, then the state law prohibits you from making     any further disclosure of the information without the specific written     consent of the person to whom it pertains, or as otherwise permitted     by law. A general authorization for the release of medical or other     information is NOT sufficient for this purpose.     The performance of this assay has not been clinically validated in     patients less than 65 years old.  Performed at [place], ACUTE     Status: Abnormal   Collection Time    11/24/13  5:46 AM      Result Value Ref Range   Hepatitis B Surface Ag NEGATIVE  NEGATIVE   HCV Ab Reactive (*) NEGATIVE   Hep A IgM NON REACTIVE  NON REACTIVE   Comment: (NOTE)     Effective December 24, 2013, Hepatitis Acute Panel (test code [id number])     will be revised to automatically reflex to the Hepatitis C Viral RNA,     Quantitative, Real-Time PCR assay if the Hepatitis C antibody     screening result is Reactive. This action is being taken to ensure     that the CDC/USPSTF recommended HCV diagnostic algorithm with the     appropriate test reflex needed for accurate interpretation is     followed.   Hep B C IgM NON REACTIVE  NON REACTIVE   Comment: (NOTE)     High levels of Hepatitis B Core IgM antibody are detectable     during the acute stage of Hepatitis B. This antibody is used     to differentiate current from past HBV infection.     Performed at [facility]  CBC      Status: Abnormal   Collection Time    11/24/13  5:49 AM      Result Value Ref Range   WBC 10.8 (*) 4.0 - 10.5 K/uL   RBC 4.30  4.22 - 5.81 MIL/uL   Hemoglobin 14.1  13.0 - 17.0 g/dL   HCT 41.1  39.0 - 52.0 %   MCV 95.6  78.0 - 100.0 fL   MCH 32.8  26.0 - 34.0 pg   MCHC 34.3  30.0 - 36.0 g/dL   RDW 14.0  11.5 - 15.5 %   Platelets 306  150 - 400 K/uL  COMPREHENSIVE METABOLIC PANEL     Status: Abnormal   Collection Time    11/24/13  5:49 AM      Result Value Ref Range   Sodium 138  137 - 147 mEq/L   Potassium 4.0  3.7 - 5.3 mEq/L   Chloride 103  96 - 112 mEq/L   CO2 26  19 - 32 mEq/L   Glucose, Bld 85  70 - 99 mg/dL   BUN 9  6 - 23 mg/dL   Creatinine, Ser 0.87  0.50 - 1.35 mg/dL   Calcium 8.9  8.4 - 10.5 mg/dL   Total Protein 6.5  6.0 - 8.3 g/dL   Albumin 3.0 (*) 3.5 - 5.2 g/dL   AST 136 (*) 0 - 37 U/L   ALT 212 (*) 0 - 53 U/L   Alkaline Phosphatase 96  39 - 117 U/L   Total Bilirubin 0.6  0.3 - 1.2 mg/dL   GFR calc non Af Amer >90  >90 mL/min   GFR calc Af Amer >90  >90 mL/min   Comment: (NOTE)     The eGFR has been calculated using the CKD EPI equation.     This calculation has not been validated in all clinical situations.     eGFR's persistently <90 mL/min signify possible Chronic Kidney     Disease.   Anion gap 9  5 - 15    No results found.  Pertinent items are noted in HPI. Temp:  [97.9 F (36.6 C)-98 F (36.7 C)] 97.9 F (36.6 C) (10/18 0556) Pulse Rate:  [79-80] 79 (  10/18 0556) Resp:  [17-18] 17 (10/18 0556) BP: (112-124)/(63-81) 112/63 mmHg (10/18 0556) SpO2:  [97 %-98 %] 97 % (10/18 0556) Weight:  [72.6 kg (160 lb 0.9 oz)] 72.6 kg (160 lb 0.9 oz) (10/17 2038) General appearance: alert and cooperative Resp: clear to auscultation bilaterally Cardio: regular rate and rhythm GI: soft, non-tender; bowel sounds normal; no masses,  no organomegaly Extremities: extremities normal, atraumatic, no cyanosis or edema  Except for LUE, erythema around dorsal wrist  with purulent draining open area , able to flex, extend fingers without much discomfort   Assessment: Abscess/cellulitis of L hand/wrist Plan: Currently draining, will anesthetize and I&D, wash out and pack with iodoform gauze.  Cont abx. Will need wound care . I have discussed this treatment plan in detail with patient, including the risks of the recommended treatment or surgery, the benefits and the alternatives.  The patient understands that additional treatment may be necessary.  Dixon Luczak CHRISTOPHER 11/25/2013, 4:48 PM

## 2013-11-26 LAB — BASIC METABOLIC PANEL
Anion gap: 13 (ref 5–15)
BUN: 14 mg/dL (ref 6–23)
CO2: 25 mEq/L (ref 19–32)
Calcium: 9 mg/dL (ref 8.4–10.5)
Chloride: 103 mEq/L (ref 96–112)
Creatinine, Ser: 0.92 mg/dL (ref 0.50–1.35)
GFR calc Af Amer: >90 mL/min (ref >90)
GFR calc non Af Amer: >90 mL/min (ref >90)
GLUCOSE: 108 mg/dL — AB (ref 70–99)
POTASSIUM: 3.8 meq/L (ref 3.7–5.3)
Sodium: 141 mEq/L (ref 137–147)

## 2013-11-26 LAB — HEPATITIS C VRS RNA DETECT BY PCR-QUAL: Hepatitis C Vrs RNA by PCR-Qual: POSITIVE — AB

## 2013-11-26 MED ORDER — OXYCODONE-ACETAMINOPHEN 5-325 MG PO TABS
1.0000 | ORAL_TABLET | Freq: Four times a day (QID) | ORAL | Status: DC | PRN
  Administered 2013-11-26 – 2013-11-28 (×6): 2 via ORAL
  Filled 2013-11-26 (×9): qty 2

## 2013-11-26 MED ORDER — KETOROLAC TROMETHAMINE 30 MG/ML IJ SOLN
30.0000 mg | Freq: Once | INTRAMUSCULAR | Status: AC
  Administered 2013-11-26: 30 mg via INTRAVENOUS
  Filled 2013-11-26: qty 1

## 2013-11-26 MED ORDER — ALUM & MAG HYDROXIDE-SIMETH 200-200-20 MG/5ML PO SUSP
15.0000 mL | Freq: Four times a day (QID) | ORAL | Status: DC | PRN
  Administered 2013-11-26: 15 mL via ORAL
  Filled 2013-11-26: qty 30

## 2013-11-26 MED ORDER — PANTOPRAZOLE SODIUM 40 MG PO TBEC
40.0000 mg | DELAYED_RELEASE_TABLET | Freq: Every day | ORAL | Status: DC
Start: 2013-11-26 — End: 2013-11-28
  Administered 2013-11-26 – 2013-11-28 (×3): 40 mg via ORAL
  Filled 2013-11-26 (×3): qty 1

## 2013-11-26 NOTE — Progress Notes (Signed)
S:hand feels better  O:Blood pressure 111/67, pulse 63, temperature 97.9 F (36.6 C), temperature source Oral, resp. rate 18, height 5\' 10"  (1.778 m), weight 72.6 kg (160 lb 0.9 oz), SpO2 100.00%.  Dressing changed, still some erythema, drainage, but overall improved  A: s/p I&D abscess R hand   P:  Cont abx, cont wound care - this pm, remove packing (iodoform), irrigate wound with saline, repack with moist 2x2 or edge of 4x4, then cover .

## 2013-11-26 NOTE — Clinical Social Work Note (Addendum)
2pm Financial counseling stated that patient has four children living at home with him that receive benefits- Financial counseling discuss options for retroactive medicaid with the patient and provided him with information on how to accomplish this.  10:00am CSW spoke with a  Artistinancial counselor concerning patients lack of insurance.  Financial counseling will plan to see patient today to discuss insurance options.  CSW signing off for now.  Merlyn LotJenna Holoman, LCSWA Clinical Social Worker [phone]

## 2013-11-26 NOTE — Progress Notes (Signed)
[facility] PROGRESS NOTE  Carollee MassedJames Whittenberg JOA:[id number]:[id number] DOB: 04/25/1965 DOA: 11/23/2013 PCP: No primary provider on file.  Assessment/Plan: 48yom presented with left lower arm and hand pain x1 week after bug or spider bite. Noted to have severe swelling and cellulitis in ED. Imaging without complicating features, admitted for IV abx.   1. Cellulitis left hand, wrist, forearm without evidence of complicating features on CT. -10/18: s/p I&D per surgery; afebrile; pend cultures; cont IV atx   2. Elevated transminases' + hep c; (HIV, Heb B negative) -check hep c viral load -pend   Code Status: full Family Communication:  D/w patient (indicate person spoken with, relationship, and if by phone, the number) Disposition Plan: home pend clinical improvement    Consultants:  ortho  Procedures:  none  Antibiotics: Vancomycin 10/16 >>  Zosyn 10/16 >>    (indicate start date, and stop date if known)  HPI/Subjective: alert  Objective: Filed Vitals:   11/26/13 0524  BP: 111/67  Pulse: 63  Temp: 97.9 F (36.6 C)  Resp: 18    Intake/Output Summary (Last 24 hours) at 11/26/13 1026 Last data filed at 11/25/13 1300  Gross per 24 hour  Intake    240 ml  Output      0 ml  Net    240 ml   Filed Weights   11/23/13 1212 11/23/13 2131 11/24/13 2038  Weight: 74.844 kg (165 lb) 73.1 kg (161 lb 2.5 oz) 72.6 kg (160 lb 0.9 oz)    Exam:   General:  alert  Cardiovascular: s1,s2 rrr  Respiratory: CTA BL  Abdomen: soft, nt,nt   Musculoskeletal: L hand draining wound    Data Reviewed: Basic Metabolic Panel:  Recent Labs Lab 11/23/13 1244 11/24/13 0549 11/26/13 0737  NA 138 138 141  K 4.1 4.0 3.8  CL 102 103 103  CO2 26 26 25   GLUCOSE 103* 85 108*  BUN 11 9 14   CREATININE 0.89 0.87 0.92  CALCIUM 9.4 8.9 9.0   Liver Function Tests:  Recent Labs Lab 11/24/13 0549  AST 136*  ALT 212*  ALKPHOS 96  BILITOT 0.6  PROT 6.5  ALBUMIN 3.0*   No results found  for this basename: LIPASE, AMYLASE,  in the last 168 hours No results found for this basename: AMMONIA,  in the last 168 hours CBC:  Recent Labs Lab 11/23/13 1244 11/24/13 0549  WBC 14.2* 10.8*  NEUTROABS 10.4*  --   HGB 15.1 14.1  HCT 44.0 41.1  MCV 95.2 95.6  PLT 327 306   Cardiac Enzymes: No results found for this basename: CKTOTAL, CKMB, CKMBINDEX, TROPONINI,  in the last 168 hours BNP (last 3 results) No results found for this basename: PROBNP,  in the last 8760 hours CBG: No results found for this basename: GLUCAP,  in the last 168 hours  Recent Results (from the past 240 hour(s))  CULTURE, BLOOD (ROUTINE X 2)     Status: None   Collection Time    11/23/13  6:12 PM      Result Value Ref Range Status   Specimen Description BLOOD LEFT ARM   Final   Special Requests BOTTLES DRAWN AEROBIC AND ANAEROBIC 12CC   Final   Culture NO GROWTH 3 DAYS   Final   Report Status PENDING   Incomplete  CULTURE, BLOOD (ROUTINE X 2)     Status: None   Collection Time    11/23/13  6:19 PM      Result Value Ref Range  Status   Specimen Description BLOOD RIGHT HAND   Final   Special Requests BOTTLES DRAWN AEROBIC AND ANAEROBIC 8CC   Final   Culture NO GROWTH 3 DAYS   Final   Report Status PENDING   Incomplete     Studies: No results found.  Scheduled Meds: . enoxaparin (LOVENOX) injection  40 mg Subcutaneous Q24H  . piperacillin-tazobactam (ZOSYN)  IV  3.375 g Intravenous 3 times per day  . vancomycin  1,000 mg Intravenous Q8H   Continuous Infusions: . sodium chloride 100 mL/hr at 11/26/13 0949    Active Problems:   Cellulitis    Time spent: >35 minutes     Esperanza SheetsBURIEV, Mitzie Marlar N  [facility] Pager [phone]. If 7PM-7AM, please contact night-coverage at [email or web address], password [email or web address] 11/26/2013, 10:26 AM  LOS: 3 days

## 2013-11-27 ENCOUNTER — Inpatient Hospital Stay (HOSPITAL_COMMUNITY): Payer: MEDICAID

## 2013-11-27 DIAGNOSIS — B1710 Acute hepatitis C without hepatic coma: Secondary | ICD-10-CM

## 2013-11-27 LAB — WOUND CULTURE
Gram Stain: NONE SEEN
Special Requests: NORMAL

## 2013-11-27 LAB — IRON AND TIBC
Iron: 100 ug/dL (ref 42–135)
SATURATION RATIOS: 26 % (ref 20–55)
TIBC: 381 ug/dL (ref 215–435)
UIBC: 281 ug/dL (ref 125–400)

## 2013-11-27 LAB — FERRITIN: Ferritin: 356 ng/mL — ABNORMAL HIGH (ref 22–322)

## 2013-11-27 LAB — PROTIME-INR
INR: 1 (ref 0.00–1.49)
PROTHROMBIN TIME: 13.3 s (ref 11.6–15.2)

## 2013-11-27 NOTE — Progress Notes (Signed)
[facility] PROGRESS NOTE  Carollee MassedJames Albarran MVH:[id number] DOB: 11/22/1965 DOA: 11/23/2013 PCP: No primary provider on file.  Assessment/Plan: 48yom presented with left lower arm and hand pain x1 week after bug or spider bite. Noted to have severe swelling and cellulitis in ED. Imaging without complicating features, admitted for IV abx.   1. Cellulitis left hand, wrist, forearm without evidence of complicating features on CT. -10/18: s/p I&D per surgery; afebrile; blood cultures NGTD; wound cultures+staph; cont IV atx;   2. Elevated transminases' + hep c; (HIV, Heb B negative) -"+"hep c viral load; d/w Dr. Ninetta LightsHatcher; will obtain ANA, iron profile, liver US and f/u with ID clinic upon discharge for evalution and treatment of Hep C;   Code Status: full Family Communication:  D/w patient (indicate person spoken with, relationship, and if by phone, the number) Disposition Plan: home pend clinical improvement; 24-48 hrs     Consultants:  ortho  Procedures:  none  Antibiotics: Vancomycin 10/16 >>  Zosyn 10/16 >>    (indicate start date, and stop date if known)  HPI/Subjective: alert  Objective: Filed Vitals:   11/27/13 0519  BP: 104/68  Pulse: 66  Temp: 97.7 F (36.5 C)  Resp: 18    Intake/Output Summary (Last 24 hours) at 11/27/13 1107 Last data filed at 11/27/13 0928  Gross per 24 hour  Intake   1200 ml  Output    100 ml  Net   1100 ml   Filed Weights   11/23/13 1212 11/23/13 2131 11/24/13 2038  Weight: 74.844 kg (165 lb) 73.1 kg (161 lb 2.5 oz) 72.6 kg (160 lb 0.9 oz)    Exam:   General:  alert  Cardiovascular: s1,s2 rrr  Respiratory: CTA BL  Abdomen: soft, nt,nt   Musculoskeletal: L hand draining wound    Data Reviewed: Basic Metabolic Panel:  Recent Labs Lab 11/23/13 1244 11/24/13 0549 11/26/13 0737  NA 138 138 141  K 4.1 4.0 3.8  CL 102 103 103  CO2 26 26 25   GLUCOSE 103* 85 108*  BUN 11 9 14   CREATININE 0.89 0.87 0.92  CALCIUM  9.4 8.9 9.0   Liver Function Tests:  Recent Labs Lab 11/24/13 0549  AST 136*  ALT 212*  ALKPHOS 96  BILITOT 0.6  PROT 6.5  ALBUMIN 3.0*   No results found for this basename: LIPASE, AMYLASE,  in the last 168 hours No results found for this basename: AMMONIA,  in the last 168 hours CBC:  Recent Labs Lab 11/23/13 1244 11/24/13 0549  WBC 14.2* 10.8*  NEUTROABS 10.4*  --   HGB 15.1 14.1  HCT 44.0 41.1  MCV 95.2 95.6  PLT 327 306   Cardiac Enzymes: No results found for this basename: CKTOTAL, CKMB, CKMBINDEX, TROPONINI,  in the last 168 hours BNP (last 3 results) No results found for this basename: PROBNP,  in the last 8760 hours CBG: No results found for this basename: GLUCAP,  in the last 168 hours  Recent Results (from the past 240 hour(s))  CULTURE, BLOOD (ROUTINE X 2)     Status: None   Collection Time    11/23/13  6:12 PM      Result Value Ref Range Status   Specimen Description BLOOD LEFT ARM   Final   Special Requests BOTTLES DRAWN AEROBIC AND ANAEROBIC 12CC   Final   Culture NO GROWTH 4 DAYS   Final   Report Status PENDING   Incomplete  CULTURE, BLOOD (ROUTINE X 2)  Status: None   Collection Time    11/23/13  6:19 PM      Result Value Ref Range Status   Specimen Description BLOOD RIGHT HAND   Final   Special Requests BOTTLES DRAWN AEROBIC AND ANAEROBIC 8CC   Final   Culture NO GROWTH 4 DAYS   Final   Report Status PENDING   Incomplete  WOUND CULTURE     Status: None   Collection Time    11/25/13 12:17 PM      Result Value Ref Range Status   Specimen Description WOUND LEFT WRIST   Final   Special Requests Normal   Final   Gram Stain     Final   Value: NO WBC SEEN     NO SQUAMOUS EPITHELIAL CELLS SEEN     NO ORGANISMS SEEN     [facility]formed [facility]   Culture     Final   Value: MODERATE STAPHYLOCOCCUS AUREUS     Note: RIFAMPIN AND GENTAMICIN SHOULD NOT BE USED AS SINGLE DRUGS FOR TREATMENT OF STAPH INFECTIONS.     Performed at [facility]   Report Status PENDING   Incomplete     Studies: No results found.  Scheduled Meds: . enoxaparin (LOVENOX) injection  40 mg Subcutaneous Q24H  . pantoprazole  40 mg Oral Daily  . piperacillin-tazobactam (ZOSYN)  IV  3.375 g Intravenous 3 times per day  . vancomycin  1,000 mg Intravenous Q8H   Continuous Infusions:    Active Problems:   Cellulitis    Time spent: >35 minutes     Esperanza SheetsBURIEV, Chae Shuster N  [facility] Pager [phone]. If 7PM-7AM, please contact night-coverage at [email or web address], password [email or web address] 11/27/2013, 11:07 AM  LOS: 4 days

## 2013-11-28 LAB — BASIC METABOLIC PANEL
Anion gap: 9 (ref 5–15)
BUN: 12 mg/dL (ref 6–23)
CO2: 28 mEq/L (ref 19–32)
Calcium: 9.2 mg/dL (ref 8.4–10.5)
Chloride: 103 mEq/L (ref 96–112)
Creatinine, Ser: 1.01 mg/dL (ref 0.50–1.35)
GFR calc Af Amer: >90 mL/min (ref >90)
GFR, EST NON AFRICAN AMERICAN: 86 mL/min — AB (ref >90)
GLUCOSE: 96 mg/dL (ref 70–99)
POTASSIUM: 3.9 meq/L (ref 3.7–5.3)
Sodium: 140 mEq/L (ref 137–147)

## 2013-11-28 LAB — CULTURE, BLOOD (ROUTINE X 2)
CULTURE: NO GROWTH
Culture: NO GROWTH

## 2013-11-28 LAB — ANA: Anti Nuclear Antibody(ANA): NEGATIVE

## 2013-11-28 MED ORDER — OXYCODONE-ACETAMINOPHEN 5-325 MG PO TABS: 1.0000 | ORAL_TABLET | Freq: Four times a day (QID) | ORAL | Status: DC | PRN

## 2013-11-28 MED ORDER — SULFAMETHOXAZOLE-TMP DS 800-160 MG PO TABS: 1.0000 | ORAL_TABLET | Freq: Two times a day (BID) | ORAL | Status: DC

## 2013-11-28 MED ORDER — SULFAMETHOXAZOLE-TMP DS 800-160 MG PO TABS
1.0000 | ORAL_TABLET | Freq: Two times a day (BID) | ORAL | Status: DC
  Administered 2013-11-28: 1 via ORAL
  Filled 2013-11-28: qty 1

## 2013-11-28 MED ORDER — "DRESSING SPONGES 4""X4"" PADS": MEDICATED_PAD | Status: DC

## 2013-11-28 NOTE — Progress Notes (Signed)
Discussed discharge summary with patient. Reviewed all medications with patient. Patient received Rx. Provided education and teach back to patient regarding wound treatment at home. Patient ready for discharge.

## 2013-11-28 NOTE — Discharge Instructions (Signed)
Follow with Primary MD  in 7 days , you have hepatitis C. Please use condoms for sex.  Keep your left arm clean and dry at all times, change dressing 3 times a day wet-to-dry as told in the hospital.  Get CBC, CMP, 2 view Chest X ray checked  by Primary MD next visit.    Activity: As tolerated with Full fall precautions use walker/cane & assistance as needed   Disposition Home     Diet: Heart Healthy    For Heart failure patients - Check your Weight same time everyday, if you gain over 2 pounds, or you develop in leg swelling, experience more shortness of breath or chest pain, call your Primary MD immediately. Follow Cardiac Low Salt Diet and 1.8 lit/day fluid restriction.   On your next visit with your primary care physician please Get Medicines reviewed and adjusted.   Please request your Prim.MD to go over all Hospital Tests and Procedure/Radiological results at the follow up, please get all Hospital records sent to your Prim MD by signing hospital release before you go home.   If you experience worsening of your admission symptoms, develop shortness of breath, life threatening emergency, suicidal or homicidal thoughts you must seek medical attention immediately by calling 911 or calling your MD immediately  if symptoms less severe.  You Must read complete instructions/literature along with all the possible adverse reactions/side effects for all the Medicines you take and that have been prescribed to you. Take any new Medicines after you have completely understood and accpet all the possible adverse reactions/side effects.   Do not drive, operating heavy machinery, perform activities at heights, swimming or participation in water activities or provide baby sitting services if your were admitted for syncope or siezures until you have seen by Primary MD or a Neurologist and advised to do so again.  Do not drive when taking Pain medications.    Do not take more than prescribed Pain,  Sleep and Anxiety Medications  Special Instructions: If you have smoked or chewed Tobacco  in the last 2 yrs please stop smoking, stop any regular Alcohol  and or any Recreational drug use.  Wear Seat belts while driving.   Please note  You were cared for by a hospitalist during your hospital stay. If you have any questions about your discharge medications or the care you received while you were in the hospital after you are discharged, you can call the unit and asked to speak with the hospitalist on call if the hospitalist that took care of you is not available. Once you are discharged, your primary care physician will handle any further medical issues. Please note that NO REFILLS for any discharge medications will be authorized once you are discharged, as it is imperative that you return to your primary care physician (or establish a relationship with a primary care physician if you do not have one) for your aftercare needs so that they can reassess your need for medications and monitor your lab values.

## 2013-11-28 NOTE — Progress Notes (Signed)
S: hand/wrist feels better.  O:Blood pressure 106/78, pulse 64, temperature 97.3 F (36.3 C), temperature source Oral, resp. rate 18, height 5\' 10"  (1.778 m), weight 72.6 kg (160 lb 0.9 oz), SpO2 98.00%.  Still some erythema, abscess cavity decreasing size  A:abscess - s/p I&D   P:  Cont wound packing with moist to dry Bid, Tid, may get wet in shower, just recover, f/u in office next week or prn worsening symptoms, cont PO antibiotics per medical team.

## 2013-11-28 NOTE — Care Management Note (Signed)
  Page 1 of 1   11/28/2013     10:46:18 AM CARE MANAGEMENT NOTE 11/28/2013  Patient:  Carollee MassedGLISH,Baker   Account Number:  [id number]  Date Initiated:  11/28/2013  Documentation initiated by:  Ronny FlurryWILE,Alfreddie Consalvo  Subjective/Objective Assessment:     Action/Plan:   Anticipated DC Date:  11/28/2013   Anticipated DC Plan:    In-house referral  Financial Counselor      DC Planning Services  CM consult  [facility]  [facility]      Choice offered to / List presented to:             Status of service:   Medicare Important Message given?   (If response is "NO", the following Medicare IM given date fields will be blank) Date Medicare IM given:   Medicare IM given by:   Date Additional Medicare IM given:   Additional Medicare IM given by:    Discharge Disposition:    Per UR Regulation:    If discussed at Long Length of Stay Meetings, dates discussed:    Comments:  [facility] . Exception entered for 14 days of pain medicine . Patient voiced understanding . Also provided [facility] information. Ronny FlurryHeather Azaliyah Kennard RN BSN [phone]

## 2013-11-28 NOTE — Discharge Summary (Signed)
[name], is a 48 y.o. male  DOB 1965-07-08  MRN [id number].  Admission date:  11/23/2013  Admitting Physician  Esperanza Sheets, MD  Discharge Date:  11/28/2013   Primary MD  No primary provider on file.  Recommendations for primary care physician for things to follow:   Monitor clinically.   Admission Diagnosis  Cellulitis of multiple sites of hand and wrist [L03.119]   Discharge Diagnosis  Cellulitis of multiple sites of hand and wrist [L03.119]    Active Problems:   Cellulitis      History reviewed. No pertinent past medical history.  Past Surgical History  Procedure Laterality Date  . Facial fracture surgery         History of present illness and  Hospital Course:     Kindly see H&P for history of present illness and admission details, please review complete Labs, Consult reports and Test reports for all details in brief  HPI  48yom presented with left lower arm and hand pain x1 week after bug or spider bite. Noted to have severe swelling and cellulitis in ED. Imaging without complicating features, admitted for IV abx.      Hospital Course    1. Cellulitis and abscess of the left hand, wrist and forearm. Seen by hand surgery Dr. [name] he underwent incision and drainage on 11/25/2013, treated with empiric IV antibiotics, much improved, discussed his case with hand surgeon. Will be discharged home on Bactrim for 3 times a day wet-to-dry dressing changes which is currently doing by himself with the help of family members. Followup appointment made with hand surgery tomorrow.   2. Elevated liver enzymes with diagnosis of hepatitis C this admission. Likely chronic, negative hepatitis B & HIV. Ultrasound of the liver nonspecific, will have him follow with both ID and GI post discharge. Hep C RNA is positive.  Unremarkable ANA, iron studies.    Discharge Condition: stable   Follow UP  Follow-up Information   Follow up with Stan Head, MD. Schedule an appointment as soon as possible for a visit in 1 week. (abnormal Liver US)    Specialty:  Gastroenterology   Contact information:   [address] [place] [address] [phone]       Follow up with [facility]    . Schedule an appointment as soon as possible for a visit in 1 week.   Contact information:   [address] [place] [address] [phone]      Schedule an appointment as soon as possible for a visit with Molinda Bailiff, MD. (next week)    Specialty:  General Surgery   Contact information:   [address] [place] [address] [phone]       Follow up with Staci Righter, MD. Schedule an appointment as soon as possible for a visit in 1 week. (Hepatitis C infection)    Specialty:  Infectious Diseases   Contact information:   [address] [place]  [id number] [phone]         Discharge Instructions  and  Discharge Medications          Discharge Instructions   Diet - low sodium heart healthy    Complete by:  As directed      Discharge instructions    Complete by:  As directed   Follow with Primary MD  in 7 days ,  you have hepatitis C. Please use condoms for sex.  Keep your left arm clean and dry at all times, change dressing 3 times a day wet-to-dry as told in the hospital.  Get CBC, CMP, 2 view Chest X ray checked  by Primary MD next visit.    Activity: As tolerated with Full fall precautions use walker/cane & assistance as needed   Disposition Home     Diet: Heart Healthy    For Heart failure patients - Check your Weight same time everyday, if you gain over 2 pounds, or you develop in leg swelling, experience more shortness of breath or chest pain, call your Primary MD immediately. Follow Cardiac Low Salt  Diet and 1.8 lit/day fluid restriction.   On your next visit with your primary care physician please Get Medicines reviewed and adjusted.   Please request your Prim.MD to go over all Hospital Tests and Procedure/Radiological results at the follow up, please get all Hospital records sent to your Prim MD by signing hospital release before you go home.   If you experience worsening of your admission symptoms, develop shortness of breath, life threatening emergency, suicidal or homicidal thoughts you must seek medical attention immediately by calling 911 or calling your MD immediately  if symptoms less severe.  You Must read complete instructions/literature along with all the possible adverse reactions/side effects for all the Medicines you take and that have been prescribed to you. Take any new Medicines after you have completely understood and accpet all the possible adverse reactions/side effects.   Do not drive, operating heavy machinery, perform activities at heights, swimming or participation in water activities or provide baby sitting services if your were admitted for syncope or siezures until you have seen by Primary MD or a Neurologist and advised to do so again.  Do not drive when taking Pain medications.    Do not take more than prescribed Pain, Sleep and Anxiety Medications  Special Instructions: If you have smoked or chewed Tobacco  in the last 2 yrs please stop smoking, stop any regular Alcohol  and or any Recreational drug use.  Wear Seat belts while driving.   Please note  You were cared for by a hospitalist during your hospital stay. If you have any questions about your discharge medications or the care you received while you were in the hospital after you are discharged, you can call the unit and asked to speak with the hospitalist on call if the hospitalist that took care of you is not available. Once you are discharged, your primary care physician will handle any further  medical issues. Please note that NO REFILLS for any discharge medications will be authorized once you are discharged, as it is imperative that you return to your primary care physician (or establish a relationship with a primary care physician if you do not have one) for your aftercare needs so that they can reassess your need for medications and monitor your lab values.     Increase activity slowly    Complete by:  As directed  Medication List         diphenhydrAMINE 25 mg capsule  Commonly known as:  BENADRYL  Take 50 mg by mouth every 6 (six) hours as needed for itching.     Dressing Sponges 4"X4" Pads  Provide 1 month supply for TID wet to dry dressing - Kerlix 4.5 inch-wide roll gauze(s)  Cover with wet to dry dressing (gauze or ABD pads) & tape, normal saline and other supplies as needed.     oxyCODONE-acetaminophen 5-325 MG per tablet  Commonly known as:  PERCOCET/ROXICET  Take 1-2 tablets by mouth every 6 (six) hours as needed for moderate pain or severe pain.     ranitidine 75 MG tablet  Commonly known as:  ZANTAC  Take 75 mg by mouth 2 (two) times daily.     sulfamethoxazole-trimethoprim 800-160 MG per tablet  Commonly known as:  BACTRIM DS  Take 1 tablet by mouth every 12 (twelve) hours.          Diet and Activity recommendation: See Discharge Instructions above   Consults obtained - hand surgery   Major procedures and Radiology Reports - PLEASE review detailed and final reports for all details, in brief -   L arm I&D Dr Izora Ribas   US Abdomen Complete W/elastography  11/27/2013   CLINICAL DATA:  Hepatitis-C  EXAM: ULTRASOUND ABDOMEN  ULTRASOUND HEPATIC ELASTOGRAPHY  TECHNIQUE: Sonography of the upper abdomen was performed. In addition, ultrasound elastography evaluation of the liver was performed. A region of interest was placed within the right lobe of the liver. Following application of a compressive sonographic pulse, shear waves were detected in  the adjacent hepatic tissue and the shear wave velocity was calculated. Multiple assessments were performed at the selected site. Median shear wave velocity is correlated to a Metavir fibrosis score.  COMPARISON:  None.  FINDINGS: ULTRASOUND ABDOMEN  Gallbladder: Small echogenic non shadowing non mobile foci along the gallbladder wall compatible with small polyps, 3 mm or less. At least 2 are visualize. No stones or wall thickening. Negative sonographic Murphy's.  Common bile duct: Diameter: Normal caliber, 4 mm.  Liver: No focal lesion identified. Within normal limits in parenchymal echogenicity.  IVC: No abnormality visualized.  Pancreas: Visualized portion unremarkable.  Spleen: Size and appearance within normal limits.  Right Kidney: Length: 12.0 cm. Echogenicity within normal limits. No mass or hydronephrosis visualized.  Left Kidney: Length: 11.5 cm. Echogenicity within normal limits. No mass or hydronephrosis visualized.  Abdominal aorta: No aneurysm visualized.  Other findings: None.  ULTRASOUND HEPATIC ELASTOGRAPHY  Device: Siemens Helix VTQ  Transducer 4V  Patient position: Left lateral decubitus  Number of measurements:  10  Hepatic Segment:  8  Median velocity:   1.13  m/sec  IQR: 0.19  IQR/Median velocity ratio 0.17  Corresponding Metavir fibrosis score:  F 0/F 1  Risk of fibrosis: Minimal  Limitations of exam: None  Pertinent findings noted on other imaging exams:  None  Please note that abnormal shear wave velocities may also be identified in clinical settings other than with hepatic fibrosis, such as: acute hepatitis, elevated right heart and central venous pressures including use of beta blockers, veno-occlusive disease (Budd-Chiari), infiltrative processes such as mastocytosis/amyloidosis/infiltrative tumor, extrahepatic cholestasis, in the post-prandial state, and liver transplantation. Correlation with patient history, laboratory data, and clinical condition recommended.  IMPRESSION: Small  gallbladder wall polyps. No stones or evidence of cholecystitis.  Median hepatic shear wave velocity is calculated at 1.13 m/sec.  Corresponding Metavir fibrosis score is F  0/F1.  Risk of fibrosis is minimal.  Follow-up:  None required   Electronically Signed   By: Charlett Nose M.D.   On: 11/27/2013 17:03   Ct Extrem Up Entire Arm L W/cm  11/23/2013   CLINICAL DATA:  Initial evaluation for spider bite 2 left wrist and thumb 6 days ago, with swelling to left hand left wrist left forearm, redness and drainage from the bite area, findings consistent with cellulitis  EXAM: CT OF THE UPPER LEFT ARM WITH CONTRAST  TECHNIQUE: Spiral imaging of the upper extremity from the level of the elbow through the hand following intravenous contrast administration  CONTRAST:  OMNIPAQUE IOHEXOL 300 MG/ML  SOLN  COMPARISON:  None.  FINDINGS: There are no osseous abnormalities. There is no evidence of osteomyelitis or periosteal reaction. There is severe soft tissue edematous and inflammatory change over the dorsal aspect of the metacarpals extending into the wrist and throughout the entire forearm. There is no focal fluid collection. To a lesser degree inflammatory change extends into the fingers. No opaque foreign bodies identified.No soft tissue gas. There is evidence of inflammatory change extending into the intramuscular compartments of the forearm and wrist.  IMPRESSION: Severe diffuse cellulitis.No specific evidence for necrotizing fasciitis, although that is a difficult possibility to exclude radiographically.   Electronically Signed   By: Esperanza Heir M.D.   On: 11/23/2013 16:21    Micro Results      Recent Results (from the past 240 hour(s))  CULTURE, BLOOD (ROUTINE X 2)     Status: None   Collection Time    11/23/13  6:12 PM      Result Value Ref Range Status   Specimen Description BLOOD LEFT ARM   Final   Special Requests BOTTLES DRAWN AEROBIC AND ANAEROBIC 12CC   Final   Culture NO GROWTH 5 DAYS    Final   Report Status 11/28/2013 FINAL   Final  CULTURE, BLOOD (ROUTINE X 2)     Status: None   Collection Time    11/23/13  6:19 PM      Result Value Ref Range Status   Specimen Description BLOOD RIGHT HAND   Final   Special Requests BOTTLES DRAWN AEROBIC AND ANAEROBIC 8CC   Final   Culture NO GROWTH 5 DAYS   Final   Report Status 11/28/2013 FINAL   Final  WOUND CULTURE     Status: None   Collection Time    11/25/13 12:17 PM      Result Value Ref Range Status   Specimen Description WOUND LEFT WRIST   Final   Special Requests Normal   Final   Gram Stain     Final   Value: NO WBC SEEN     NO SQUAMOUS EPITHELIAL CELLS SEEN     NO ORGANISMS SEEN     Performed at [facility]   Culture     Final   Value: MODERATE STAPHYLOCOCCUS AUREUS     Note: RIFAMPIN AND GENTAMICIN SHOULD NOT BE USED AS SINGLE DRUGS FOR TREATMENT OF STAPH INFECTIONS.     Performed at [facility]   Report Status 11/27/2013 FINAL   Final   Organism ID, Bacteria STAPHYLOCOCCUS AUREUS   Final       Today   Subjective:   [name] today has no headache,no chest abdominal pain,no new weakness tingling or numbness, feels much better wants to go home today.   Objective:   Blood pressure 106/78, pulse 64, temperature  97.3 F (36.3 C), temperature source Oral, resp. rate 18, height 5\' 10"  (1.778 m), weight 72.6 kg (160 lb 0.9 oz), SpO2 98.00%.   Intake/Output Summary (Last 24 hours) at 11/28/13 1324 Last data filed at 11/28/13 1100  Gross per 24 hour  Intake    720 ml  Output      0 ml  Net    720 ml    Exam Awake Alert, Oriented x 3, No new F.N deficits, Normal affect [place].AT,PERRAL Supple Neck,No JVD, No cervical lymphadenopathy appriciated.  Symmetrical Chest wall movement, Good air movement bilaterally, CTAB RRR,No Gallops,Rubs or new Murmurs, No Parasternal Heave +ve B.Sounds, Abd Soft, Non tender, No organomegaly appriciated, No rebound -guarding or rigidity. No Cyanosis,  Clubbing or edema, No new Rash or bruise, L arm under bandage  Data Review   CBC w Diff: Lab Results  Component Value Date   WBC 10.8* 11/24/2013   HGB 14.1 11/24/2013   HCT 41.1 11/24/2013   PLT 306 11/24/2013   LYMPHOPCT 16 11/23/2013   MONOPCT 10 11/23/2013   EOSPCT 1 11/23/2013   BASOPCT 0 11/23/2013    CMP: Lab Results  Component Value Date   NA 140 11/28/2013   K 3.9 11/28/2013   CL 103 11/28/2013   CO2 28 11/28/2013   BUN 12 11/28/2013   CREATININE 1.01 11/28/2013   PROT 6.5 11/24/2013   ALBUMIN 3.0* 11/24/2013   BILITOT 0.6 11/24/2013   ALKPHOS 96 11/24/2013   AST 136* 11/24/2013   ALT 212* 11/24/2013  .   Total Time in preparing paper work, data evaluation and todays exam - 35 minutes  Leroy SeaSINGH,PRASHANT K M.D on 11/28/2013 at 1:24 PM  [facility] Office  [phone]

## 2013-11-29 NOTE — ED Provider Notes (Signed)
Medical screening examination/treatment/procedure(s) were conducted as a shared visit with non-physician practitioner(s) and myself.  I personally evaluated the patient during the encounter.   EKG Interpretation None      Patient seen and evaluated. Diffuse cellulitis. Agree with assessment and plan as above including IV antibiotics and admission.  Rolland PorterMark Deckard, MD 11/29/13 [phone]

## 2013-12-17 ENCOUNTER — Encounter: Payer: Self-pay | Admitting: Internal Medicine

## 2013-12-17 ENCOUNTER — Ambulatory Visit (HOSPITAL_COMMUNITY)
Admission: RE | Admit: 2013-12-17 | Discharge: 2013-12-17 | Disposition: A | Discharge status: Home / Self Care | Payer: Self-pay | Source: Ambulatory Visit | Attending: Internal Medicine | Admitting: Internal Medicine

## 2013-12-17 ENCOUNTER — Other Ambulatory Visit: Payer: Self-pay | Admitting: Internal Medicine

## 2013-12-17 ENCOUNTER — Ambulatory Visit: Payer: Self-pay | Attending: Internal Medicine | Admitting: Internal Medicine

## 2013-12-17 ENCOUNTER — Ambulatory Visit (HOSPITAL_BASED_OUTPATIENT_CLINIC_OR_DEPARTMENT_OTHER): Payer: MEDICAID

## 2013-12-17 VITALS — BP 136/94 | HR 84 | Temp 98.0°F | Resp 16 | Wt 172.0 lb

## 2013-12-17 DIAGNOSIS — L03114 Cellulitis of left upper limb: Secondary | ICD-10-CM

## 2013-12-17 DIAGNOSIS — M546 Pain in thoracic spine: Secondary | ICD-10-CM | POA: Insufficient documentation

## 2013-12-17 DIAGNOSIS — B1920 Unspecified viral hepatitis C without hepatic coma: Secondary | ICD-10-CM | POA: Insufficient documentation

## 2013-12-17 DIAGNOSIS — Z79899 Other long term (current) drug therapy: Secondary | ICD-10-CM | POA: Insufficient documentation

## 2013-12-17 DIAGNOSIS — IMO0001 Reserved for inherently not codable concepts without codable children: Secondary | ICD-10-CM

## 2013-12-17 DIAGNOSIS — Z23 Encounter for immunization: Secondary | ICD-10-CM

## 2013-12-17 DIAGNOSIS — B182 Chronic viral hepatitis C: Secondary | ICD-10-CM | POA: Insufficient documentation

## 2013-12-17 DIAGNOSIS — M62838 Other muscle spasm: Secondary | ICD-10-CM | POA: Insufficient documentation

## 2013-12-17 DIAGNOSIS — R768 Other specified abnormal immunological findings in serum: Secondary | ICD-10-CM

## 2013-12-17 DIAGNOSIS — M549 Dorsalgia, unspecified: Secondary | ICD-10-CM | POA: Insufficient documentation

## 2013-12-17 DIAGNOSIS — I10 Essential (primary) hypertension: Secondary | ICD-10-CM | POA: Insufficient documentation

## 2013-12-17 DIAGNOSIS — F17210 Nicotine dependence, cigarettes, uncomplicated: Secondary | ICD-10-CM | POA: Insufficient documentation

## 2013-12-17 DIAGNOSIS — R894 Abnormal immunological findings in specimens from other organs, systems and tissues: Secondary | ICD-10-CM

## 2013-12-17 DIAGNOSIS — H538 Other visual disturbances: Secondary | ICD-10-CM | POA: Insufficient documentation

## 2013-12-17 DIAGNOSIS — F17200 Nicotine dependence, unspecified, uncomplicated: Secondary | ICD-10-CM

## 2013-12-17 DIAGNOSIS — Z833 Family history of diabetes mellitus: Secondary | ICD-10-CM | POA: Insufficient documentation

## 2013-12-17 DIAGNOSIS — R030 Elevated blood-pressure reading, without diagnosis of hypertension: Secondary | ICD-10-CM

## 2013-12-17 DIAGNOSIS — M62830 Muscle spasm of back: Secondary | ICD-10-CM

## 2013-12-17 DIAGNOSIS — Z139 Encounter for screening, unspecified: Secondary | ICD-10-CM | POA: Insufficient documentation

## 2013-12-17 DIAGNOSIS — Z720 Tobacco use: Secondary | ICD-10-CM

## 2013-12-17 HISTORY — DX: Chronic viral hepatitis C: B18.2

## 2013-12-17 LAB — HEMOGLOBIN A1C
Hgb A1c MFr Bld: 5.7 % — ABNORMAL HIGH (ref <5.7)
MEAN PLASMA GLUCOSE: 117 mg/dL — AB (ref <117)

## 2013-12-17 LAB — LIPID PANEL
Cholesterol: 191 mg/dL (ref 0–200)
HDL: 53 mg/dL (ref >39)
LDL Cholesterol: 109 mg/dL — ABNORMAL HIGH (ref 0–99)
TRIGLYCERIDES: 145 mg/dL (ref <150)
Total CHOL/HDL Ratio: 3.6 Ratio
VLDL: 29 mg/dL (ref 0–40)

## 2013-12-17 LAB — TSH: TSH: 1.29 u[IU]/mL (ref 0.350–4.500)

## 2013-12-17 MED ORDER — IBUPROFEN 800 MG PO TABS: 800.0000 mg | ORAL_TABLET | Freq: Three times a day (TID) | ORAL | Status: DC | PRN

## 2013-12-17 MED ORDER — BACLOFEN 10 MG PO TABS: 10.0000 mg | ORAL_TABLET | Freq: Every day | ORAL | Status: DC

## 2013-12-17 NOTE — Progress Notes (Signed)
Patient Demographics  Clifford MassedJames Piechowski, is a 48 y.o. male  KGM:[id number]:[id number]  [id number]:[id number]  DOB - 02/25/1965  CC:  Chief Complaint  Patient presents with  . new patient       HPI: [name] is a 48 y.o. male here today to establish medical care.Patient was recently hospitalized and diagnosed with cellulitis of left lower arm and hand likely secondary to bug/spider bite, EMR reviewed patient was started on antibiotics, surgery on board her he had incision and drainage, symptomatically improved and was discharged on Bactrim, he was advised to follow with surgery patient also has history of hepatitis C, patient was advised to follow with infectious disease. Patient today complains of back pain and leg pain he reports history of fall 3 days ago. Patient is also complaining of some blurry vision on and off, as per patient he followed up with and surgeon one week ago has minimal pain denies any fever chills, today's blood pressure is elevated, denies any headache dizziness chest and shortness of breath, patient does smoke cigarettes, I have advised patient to quit smoking, he reported family history of diabetes Patient has No headache, No chest pain, No abdominal pain - No Nausea, No new weakness tingling or numbness, No Cough - SOB.  No Known Allergies History reviewed. No pertinent past medical history. Current Outpatient Prescriptions on File Prior to Visit  Medication Sig Dispense Refill  . diphenhydrAMINE (BENADRYL) 25 mg capsule Take 50 mg by mouth every 6 (six) hours as needed for itching.    . Gauze Pads & Dressings (DRESSING SPONGES) 4"X4" PADS Provide 1 month supply for TID wet to dry dressing - Kerlix 4.5 inch-wide roll gauze(s)  Cover with wet to dry dressing (gauze or ABD pads) & tape, normal saline and other supplies as needed. 30 each 1  . oxyCODONE-acetaminophen (PERCOCET/ROXICET) 5-325 MG per tablet Take 1-2 tablets by mouth every 6 (six) hours as needed for moderate pain or  severe pain. 20 tablet 0  . ranitidine (ZANTAC) 75 MG tablet Take 75 mg by mouth 2 (two) times daily.    Marland Kitchen. sulfamethoxazole-trimethoprim (BACTRIM DS) 800-160 MG per tablet Take 1 tablet by mouth every 12 (twelve) hours. 14 tablet 0   No current facility-administered medications on file prior to visit.   Family History  Problem Relation Age of Onset  . Diabetes Mother   . Hypertension Mother   . Hypertension Father    History   Social History  . Marital Status: Married    Spouse Name: N/A    Number of Children: N/A  . Years of Education: N/A   Occupational History  . Not on file.   Social History Main Topics  . Smoking status: Current Every Day Smoker -- 1.00 packs/day for 20 years  . Smokeless tobacco: Not on file  . Alcohol Use: No  . Drug Use: No  . Sexual Activity: Not on file   Other Topics Concern  . Not on file   Social History Narrative    Review of Systems: Constitutional: Negative for fever, chills, diaphoresis, activity change, appetite change and fatigue. HENT: Negative for ear pain, nosebleeds, congestion, facial swelling, rhinorrhea, neck pain, neck stiffness and ear discharge.  Eyes: Negative for pain, discharge, redness, itching and visual disturbance. Respiratory: Negative for cough, choking, chest tightness, shortness of breath, wheezing and stridor.  Cardiovascular: Negative for chest pain, palpitations and leg swelling. Gastrointestinal: Negative for abdominal distention. Genitourinary: Negative for dysuria, urgency, frequency, hematuria, flank pain, decreased  urine volume, difficulty urinating and dyspareunia.  Musculoskeletal: Negative for back pain, joint swelling, arthralgia and gait problem. Neurological: Negative for dizziness, tremors, seizures, syncope, facial asymmetry, speech difficulty, weakness, light-headedness, numbness and headaches.  Hematological: Negative for adenopathy. Does not bruise/bleed easily. Psychiatric/Behavioral: Negative  for hallucinations, behavioral problems, confusion, dysphoric mood, decreased concentration and agitation.    Objective:   Filed Vitals:   12/17/13 0923  BP: 136/94  Pulse: 84  Temp: 98 F (36.7 C)  Resp: 16    Physical Exam: Constitutional: Patient appears well-developed and well-nourished. No distress. HENT: Normocephalic, atraumatic, External right and left ear normal. Oropharynx is clear and moist.  Eyes: Conjunctivae and EOM are normal. PERRLA, no scleral icterus. Neck: Normal ROM. Neck supple. No JVD. No tracheal deviation. No thyromegaly. CVS: RRR, S1/S2 +, no murmurs, no gallops, no carotid bruit.  Pulmonary: Effort and breath sounds normal, no stridor, rhonchi, wheezes, rales.  Abdominal: Soft. BS +, no distension, tenderness, rebound or guarding.  Musculoskeletal: Normal range of motion. No edema and no tenderness. Mid thoracic paraspinal tenderness. SLR negative  Neuro: Alert. Normal reflexes, muscle tone coordination. No cranial nerve deficit. Skin: Skin is warm and dry. No rash noted. Not diaphoretic. No erythema. No pallor. Psychiatric: Normal mood and affect. Behavior, judgment, thought content normal.  Lab Results  Component Value Date   WBC 10.8* 11/24/2013   HGB 14.1 11/24/2013   HCT 41.1 11/24/2013   MCV 95.6 11/24/2013   PLT 306 11/24/2013   Lab Results  Component Value Date   CREATININE 1.01 11/28/2013   BUN 12 11/28/2013   NA 140 11/28/2013   K 3.9 11/28/2013   CL 103 11/28/2013   CO2 28 11/28/2013    No results found for: HGBA1C Lipid Panel  No results found for: CHOL, TRIG, HDL, CHOLHDL, VLDL, LDLCALC     Assessment and plan:   1. Cellulitis of left upper extremity Patient is completed the course of antibiotic and already followed with surgery.  2. Hepatitis C antibody test positive  - Ambulatory referral to Infectious Disease  3. Elevated BP Advised patient for DASH diet  4. Smoking Advised patient to quit smoking.  5. Needs  flu shot Flu shot given today.  6. Family history of diabetes mellitus (DM)  - Hemoglobin A1c  7. Back muscle spasm Advised patient to apply heating pad, also prescribed baclofen.  8. Thoracic back pain, unspecified back pain laterality  - ibuprofen (ADVIL,MOTRIN) 800 MG tablet; Take 1 tablet (800 mg total) by mouth every 8 (eight) hours as needed.  Dispense: 30 tablet; Refill: 1 - baclofen (LIORESAL) 10 MG tablet; Take 1 tablet (10 mg total) by mouth at bedtime.  Dispense: 30 each; Refill: 1 - DG Thoracic Spine 4V; Future  9. Screening Ordered baseline blood work.  - TSH - Vit D  25 hydroxy (rtn osteoporosis monitoring) - Lipid panel  10. Blurry vision  - Ambulatory referral to Ophthalmology        Health Maintenance  -Vaccinations:  uptodate with pneumovax  Flu shot today   Return in about 3 months (around 03/19/2014).    Doris CheadleADVANI, Limmie Schoenberg, MD

## 2013-12-17 NOTE — Progress Notes (Signed)
Patient here for hospital follow up- spider  Bite to left wrist Patient is recently diagnosed with hep C Fell three days ago on a ramp and complains of back pain and left leg pain

## 2013-12-17 NOTE — Patient Instructions (Signed)
DASH Eating Plan °DASH stands for "Dietary Approaches to Stop Hypertension." The DASH eating plan is a healthy eating plan that has been shown to reduce high blood pressure (hypertension). Additional health benefits may include reducing the risk of type 2 diabetes mellitus, heart disease, and stroke. The DASH eating plan may also help with weight loss. °WHAT DO I NEED TO KNOW ABOUT THE DASH EATING PLAN? °For the DASH eating plan, you will follow these general guidelines: °· Choose foods with a percent daily value for sodium of less than 5% (as listed on the food label). °· Use salt-free seasonings or herbs instead of table salt or sea salt. °· Check with your health care provider or pharmacist before using salt substitutes. °· Eat lower-sodium products, often labeled as "lower sodium" or "no salt added." °· Eat fresh foods. °· Eat more vegetables, fruits, and low-fat dairy products. °· Choose whole grains. Look for the word "whole" as the first word in the ingredient list. °· Choose fish and skinless chicken or turkey more often than red meat. Limit fish, poultry, and meat to 6 oz (170 g) each day. °· Limit sweets, desserts, sugars, and sugary drinks. °· Choose heart-healthy fats. °· Limit cheese to 1 oz (28 g) per day. °· Eat more home-cooked food and less restaurant, buffet, and fast food. °· Limit fried foods. °· Cook foods using methods other than frying. °· Limit canned vegetables. If you do use them, rinse them well to decrease the sodium. °· When eating at a restaurant, ask that your food be prepared with less salt, or no salt if possible. °WHAT FOODS CAN I EAT? °Seek help from a dietitian for individual calorie needs. °Grains °Whole grain or whole wheat bread. Brown rice. Whole grain or whole wheat pasta. Quinoa, bulgur, and whole grain cereals. Low-sodium cereals. Corn or whole wheat flour tortillas. Whole grain cornbread. Whole grain crackers. Low-sodium crackers. °Vegetables °Fresh or frozen vegetables  (raw, steamed, roasted, or grilled). Low-sodium or reduced-sodium tomato and vegetable juices. Low-sodium or reduced-sodium tomato sauce and paste. Low-sodium or reduced-sodium canned vegetables.  °Fruits °All fresh, canned (in natural juice), or frozen fruits. °Meat and Other Protein Products °Ground beef (85% or leaner), grass-fed beef, or beef trimmed of fat. Skinless chicken or turkey. Ground chicken or turkey. Pork trimmed of fat. All fish and seafood. Eggs. Dried beans, peas, or lentils. Unsalted nuts and seeds. Unsalted canned beans. °Dairy °Low-fat dairy products, such as skim or 1% milk, 2% or reduced-fat cheeses, low-fat ricotta or cottage cheese, or plain low-fat yogurt. Low-sodium or reduced-sodium cheeses. °Fats and Oils °Tub margarines without trans fats. Light or reduced-fat mayonnaise and salad dressings (reduced sodium). Avocado. Safflower, olive, or canola oils. Natural peanut or almond butter. °Other °Unsalted popcorn and pretzels. °The items listed above may not be a complete list of recommended foods or beverages. Contact your dietitian for more options. °WHAT FOODS ARE NOT RECOMMENDED? °Grains °White bread. White pasta. White rice. Refined cornbread. Bagels and croissants. Crackers that contain trans fat. °Vegetables °Creamed or fried vegetables. Vegetables in a cheese sauce. Regular canned vegetables. Regular canned tomato sauce and paste. Regular tomato and vegetable juices. °Fruits °Dried fruits. Canned fruit in light or heavy syrup. Fruit juice. °Meat and Other Protein Products °Fatty cuts of meat. Ribs, chicken wings, bacon, sausage, bologna, salami, chitterlings, fatback, hot dogs, bratwurst, and packaged luncheon meats. Salted nuts and seeds. Canned beans with salt. °Dairy °Whole or 2% milk, cream, half-and-half, and cream cheese. Whole-fat or sweetened yogurt. Full-fat   cheeses or blue cheese. Nondairy creamers and whipped toppings. Processed cheese, cheese spreads, or cheese  curds. °Condiments °Onion and garlic salt, seasoned salt, table salt, and sea salt. Canned and packaged gravies. Worcestershire sauce. Tartar sauce. Barbecue sauce. Teriyaki sauce. Soy sauce, including reduced sodium. Steak sauce. Fish sauce. Oyster sauce. Cocktail sauce. Horseradish. Ketchup and mustard. Meat flavorings and tenderizers. Bouillon cubes. Hot sauce. Tabasco sauce. Marinades. Taco seasonings. Relishes. °Fats and Oils °Butter, stick margarine, lard, shortening, ghee, and bacon fat. Coconut, palm kernel, or palm oils. Regular salad dressings. °Other °Pickles and olives. Salted popcorn and pretzels. °The items listed above may not be a complete list of foods and beverages to avoid. Contact your dietitian for more information. °WHERE CAN I FIND MORE INFORMATION? °National [employer], [employer], and [employer] Institute: www.nhlbi.nih.gov/health/health-topics/topics/dash/ °Document Released: 01/14/2011 Document Revised: 06/11/2013 Document Reviewed: 11/29/2012 °Exit[employer]® Patient Information ©2015 [employer]. This information is not intended to replace advice given to you by your health care provider. Make sure you discuss any questions you have with your health care provider. ° °

## 2013-12-18 LAB — VITAMIN D 25 HYDROXY (VIT D DEFICIENCY, FRACTURES): Vit D, 25-Hydroxy: 35 ng/mL (ref 30–89)

## 2014-01-08 ENCOUNTER — Other Ambulatory Visit: Payer: Self-pay

## 2014-01-08 DIAGNOSIS — B182 Chronic viral hepatitis C: Secondary | ICD-10-CM

## 2014-01-09 LAB — HEPATITIS B SURFACE ANTIBODY,QUALITATIVE: Hep B S Ab: NEGATIVE

## 2014-01-10 LAB — HEPATITIS C RNA QUANTITATIVE
HCV QUANT LOG: 3.53 {Log} — AB (ref <1.18)
HCV QUANT: 3378 [IU]/mL — AB (ref <15)

## 2014-01-14 LAB — HEPATITIS C GENOTYPE: HCV Genotype: 3

## 2014-02-11 ENCOUNTER — Ambulatory Visit: Payer: Self-pay | Admitting: Internal Medicine

## 2014-09-25 ENCOUNTER — Encounter (HOSPITAL_COMMUNITY): Payer: Self-pay | Admitting: Emergency Medicine

## 2014-09-25 ENCOUNTER — Emergency Department (HOSPITAL_COMMUNITY)
Admission: EM | Admit: 2014-09-25 | Discharge: 2014-09-25 | Disposition: A | Discharge status: Home / Self Care | Payer: Self-pay | Attending: Emergency Medicine | Admitting: Emergency Medicine

## 2014-09-25 DIAGNOSIS — L0390 Cellulitis, unspecified: Secondary | ICD-10-CM

## 2014-09-25 DIAGNOSIS — Z79899 Other long term (current) drug therapy: Secondary | ICD-10-CM | POA: Insufficient documentation

## 2014-09-25 DIAGNOSIS — Z720 Tobacco use: Secondary | ICD-10-CM | POA: Insufficient documentation

## 2014-09-25 DIAGNOSIS — Z8619 Personal history of other infectious and parasitic diseases: Secondary | ICD-10-CM | POA: Insufficient documentation

## 2014-09-25 DIAGNOSIS — L0291 Cutaneous abscess, unspecified: Secondary | ICD-10-CM

## 2014-09-25 DIAGNOSIS — L02415 Cutaneous abscess of right lower limb: Secondary | ICD-10-CM | POA: Insufficient documentation

## 2014-09-25 DIAGNOSIS — L03115 Cellulitis of right lower limb: Secondary | ICD-10-CM | POA: Insufficient documentation

## 2014-09-25 HISTORY — DX: Unspecified viral hepatitis C without hepatic coma: B19.20

## 2014-09-25 MED ORDER — OXYCODONE HCL 5 MG PO TABS
5.0000 mg | ORAL_TABLET | Freq: Once | ORAL | Status: AC
  Administered 2014-09-25: 5 mg via ORAL
  Filled 2014-09-25: qty 1

## 2014-09-25 MED ORDER — IBUPROFEN 800 MG PO TABS
800.0000 mg | ORAL_TABLET | Freq: Once | ORAL | Status: AC
  Administered 2014-09-25: 800 mg via ORAL
  Filled 2014-09-25: qty 1

## 2014-09-25 MED ORDER — CEPHALEXIN 500 MG PO CAPS: 500.0000 mg | ORAL_CAPSULE | Freq: Four times a day (QID) | ORAL | Status: DC

## 2014-09-25 MED ORDER — LIDOCAINE-EPINEPHRINE (PF) 1 %-1:200000 IJ SOLN
10.0000 mL | Freq: Once | INTRAMUSCULAR | Status: AC
  Administered 2014-09-25: 10 mL via INTRADERMAL
  Filled 2014-09-25: qty 10

## 2014-09-25 MED ORDER — ACETAMINOPHEN 500 MG PO TABS
1000.0000 mg | ORAL_TABLET | Freq: Once | ORAL | Status: AC
  Administered 2014-09-25: 1000 mg via ORAL
  Filled 2014-09-25: qty 2

## 2014-09-25 MED ORDER — LIDOCAINE HCL (PF) 1 % IJ SOLN: 5.0000 mL | Freq: Once | INTRAMUSCULAR | Status: DC

## 2014-09-25 NOTE — ED Notes (Signed)
Pt c/o abscess to the rt leg x 4 days.

## 2014-09-25 NOTE — ED Provider Notes (Signed)
CSN: [id number]     Arrival date & time 09/25/14  1944 History   First MD Initiated Contact with Patient 09/25/14 2020     Chief Complaint  Patient presents with  . Abscess     (Consider location/radiation/quality/duration/timing/severity/associated sxs/prior Treatment) Patient is a 49 y.o. male presenting with abscess. The history is provided by the patient.  Abscess Location:  Leg Leg abscess location:  R leg Size:  3cm Abscess quality: draining and fluctuance   Red streaking: no   Duration:  4 days Progression:  Worsening Chronicity:  Recurrent Context: not diabetes, not immunosuppression and not injected drug use   Relieved by:  Nothing Worsened by:  Nothing tried Ineffective treatments:  None tried Associated symptoms: no fever, no headaches and no vomiting    49 yo M with a chief complaint of a right shin abscess. This started about 4 days ago. Progressively worsening. Some subjective fevers at home denies chills nausea abdominal pain. History of these in the past.  Past Medical History  Diagnosis Date  . Hepatitis C    Past Surgical History  Procedure Laterality Date  . Facial fracture surgery     Family History  Problem Relation Age of Onset  . Diabetes Mother   . Hypertension Mother   . Hypertension Father    Social History  Substance Use Topics  . Smoking status: Current Every Day Smoker -- 1.00 packs/day for 20 years  . Smokeless tobacco: None  . Alcohol Use: Yes    Review of Systems  Constitutional: Negative for fever and chills.  HENT: Negative for congestion and facial swelling.   Eyes: Negative for discharge and visual disturbance.  Respiratory: Negative for shortness of breath.   Cardiovascular: Negative for chest pain and palpitations.  Gastrointestinal: Negative for vomiting, abdominal pain and diarrhea.  Musculoskeletal: Negative for myalgias and arthralgias.  Skin: Positive for wound. Negative for color change and rash.  Neurological:  Negative for tremors, syncope and headaches.  Psychiatric/Behavioral: Negative for confusion and dysphoric mood.      Allergies  Review of patient's allergies indicates no known allergies.  Home Medications   Prior to Admission medications   Medication Sig Start Date End Date Taking? Authorizing Provider  ranitidine (ZANTAC) 75 MG tablet Take 75 mg by mouth 2 (two) times daily.   Yes Historical Provider, MD  cephALEXin (KEFLEX) 500 MG capsule Take 1 capsule (500 mg total) by mouth 4 (four) times daily. 09/25/14   Melene Plan, DO   BP 127/90 mmHg  Pulse 99  Temp(Src) 98.7 F (37.1 C) (Oral)  Resp 20  Ht  (1.778 m)  Wt 170 lb (77.111 kg)  BMI 24.39 kg/m2  SpO2 100% Physical Exam  Constitutional: He is oriented to person, place, and time. He appears well-developed and well-nourished.  HENT:  Head: Normocephalic and atraumatic.  Eyes: EOM are normal. Pupils are equal, round, and reactive to light.  Neck: Normal range of motion. Neck supple. No JVD present.  Cardiovascular: Normal rate and regular rhythm.  Exam reveals no gallop and no friction rub.   No murmur heard. Pulmonary/Chest: No respiratory distress. He has no wheezes.  Abdominal: He exhibits no distension. There is no rebound and no guarding.  Musculoskeletal: Normal range of motion. He exhibits no edema or tenderness.  Neurological: He is alert and oriented to person, place, and time.  Skin: No rash noted. No pallor.  3cm abscess with mild drainage.  Fluctuant with surrounding induration and erythema  Psychiatric: He  has a normal mood and affect. His behavior is normal.    ED Course  Procedures (including critical care time) Labs Review Labs Reviewed - No data to display  Imaging Review No results found. I have personally reviewed and evaluated these images and lab results as part of my medical decision-making.   EKG Interpretation None      MDM   Final diagnoses:  Abscess and cellulitis    49yo  M with an abscess. I&D at bedside by [facility], please see her note for further details.   With mild induration and erythema extending from the abscess will treat with a course of Keflex.   9:50 PM:  I have discussed the diagnosis/risks/treatment options with the patient and family and believe the pt to be eligible for discharge home to follow-up with PCP. We also discussed returning to the ED immediately if new or worsening sx occur. We discussed the sx which are most concerning (e.g., fever, abdominal pain, vomiting) that necessitate immediate return. Medications administered to the patient during their visit and any new prescriptions provided to the patient are listed below.  Medications given during this visit Medications  lidocaine (PF) (XYLOCAINE) 1 % injection 5 mL (5 mLs Infiltration Not Given 09/25/14 2059)  acetaminophen (TYLENOL) tablet 1,000 mg (1,000 mg Oral Given 09/25/14 2059)  ibuprofen (ADVIL,MOTRIN) tablet 800 mg (800 mg Oral Given 09/25/14 2059)  oxyCODONE (Oxy IR/ROXICODONE) immediate release tablet 5 mg (5 mg Oral Given 09/25/14 2059)  lidocaine-EPINEPHrine (XYLOCAINE-EPINEPHrine) 1 %-1:200000 (PF) injection 10 mL (10 mLs Intradermal Given 09/25/14 2059)    Discharge Medication List as of 09/25/2014  9:19 PM    START taking these medications   Details  cephALEXin (KEFLEX) 500 MG capsule Take 1 capsule (500 mg total) by mouth 4 (four) times daily., Starting 09/25/2014, Until Discontinued, Print         The patient appears reasonably screen and/or stabilized for discharge and I doubt any other medical condition or other [facility] requiring further screening, evaluation, or treatment in the ED at this time prior to discharge.      Melene Plan, DO 09/25/14 2150

## 2014-09-25 NOTE — ED Provider Notes (Signed)
THIS IS A SHARED VISIT WITH DR. Adela Lank.   Cyril Woodmansee is a 49 y.o. male with an abscess to the right lower leg.  BP 127/90 mmHg  Pulse 99  Temp(Src) 98.7 F (37.1 C) (Oral)  Resp 20  Ht  (1.778 m)  Wt 170 lb (77.111 kg)  BMI 24.39 kg/m2  SpO2 100%  PROCEDURE NOTE: INCISION AND DRAINAGE Performed by: Najiyah Paris Consent: Verbal consent obtained. Risks and benefits: risks, benefits and alternatives were discussed Type: abscess  Body area: right lower leg  Cleaned with Betadine  Anesthesia: local infiltration  Local anesthetic: lidocaine 1% with epinephrine  Anesthetic total: 4 ml  Patient draped  Incision:  made with # 11 blade  Type: single, straight  Complexity: complex Blunt dissection to break up loculations  Drainage: purulent  Drainage amount: moderate  Irrigated with NSS   Packing material: 1/2 in iodoform gauze  Patient tolerance: Patient tolerated the procedure well with no immediate complications.     Janne Napoleon, NP 09/25/14 2117  Melene Plan, DO 09/25/14 2150

## 2014-09-25 NOTE — Discharge Instructions (Signed)
Take 4 over the counter ibuprofen tablets 3 times a day or 2 over-the-counter naproxen tablets twice a day for pain.  Cellulitis Cellulitis is an infection of the skin and the tissue under the skin. The infected area is usually red and tender. This happens most often in the arms and lower legs. HOME CARE   Take your antibiotic medicine as told. Finish the medicine even if you start to feel better.  Keep the infected arm or leg raised (elevated).  Put a warm cloth on the area up to 4 times per day.  Only take medicines as told by your doctor.  Keep all doctor visits as told. GET HELP IF:  You see red streaks on the skin coming from the infected area.  Your red area gets bigger or turns a dark color.  Your bone or joint under the infected area is painful after the skin heals.  Your infection comes back in the same area or different area.  You have a puffy (swollen) bump in the infected area.  You have new symptoms.  You have a fever. GET HELP RIGHT AWAY IF:   You feel very sleepy.  You throw up (vomit) or have watery poop (diarrhea).  You feel sick and have muscle aches and pains. MAKE SURE YOU:   Understand these instructions.  Will watch your condition.  Will get help right away if you are not doing well or get worse. Document Released: 07/14/2007 Document Revised: 06/11/2013 Document Reviewed: 04/12/2011 [facility] Patient Information 2015 [place], [place]. This information is not intended to replace advice given to you by your health care provider. Make sure you discuss any questions you have with your health care provider.

## 2014-09-27 ENCOUNTER — Encounter (HOSPITAL_COMMUNITY): Payer: Self-pay | Admitting: Emergency Medicine

## 2014-09-27 ENCOUNTER — Emergency Department (HOSPITAL_COMMUNITY)
Admission: EM | Admit: 2014-09-27 | Discharge: 2014-09-27 | Disposition: A | Discharge status: Home / Self Care | Payer: Self-pay | Attending: Emergency Medicine | Admitting: Emergency Medicine

## 2014-09-27 DIAGNOSIS — R000 Tachycardia, unspecified: Secondary | ICD-10-CM | POA: Insufficient documentation

## 2014-09-27 DIAGNOSIS — Z79899 Other long term (current) drug therapy: Secondary | ICD-10-CM | POA: Insufficient documentation

## 2014-09-27 DIAGNOSIS — R112 Nausea with vomiting, unspecified: Secondary | ICD-10-CM | POA: Insufficient documentation

## 2014-09-27 DIAGNOSIS — Z8619 Personal history of other infectious and parasitic diseases: Secondary | ICD-10-CM | POA: Insufficient documentation

## 2014-09-27 DIAGNOSIS — K219 Gastro-esophageal reflux disease without esophagitis: Secondary | ICD-10-CM | POA: Insufficient documentation

## 2014-09-27 DIAGNOSIS — L03115 Cellulitis of right lower limb: Secondary | ICD-10-CM | POA: Insufficient documentation

## 2014-09-27 DIAGNOSIS — Z720 Tobacco use: Secondary | ICD-10-CM | POA: Insufficient documentation

## 2014-09-27 HISTORY — DX: Diaphragmatic hernia without obstruction or gangrene: K44.9

## 2014-09-27 HISTORY — DX: Gastro-esophageal reflux disease without esophagitis: K21.9

## 2014-09-27 MED ORDER — OXYCODONE-ACETAMINOPHEN 5-325 MG PO TABS: 2.0000 | ORAL_TABLET | ORAL | Status: DC | PRN

## 2014-09-27 MED ORDER — ONDANSETRON HCL 4 MG PO TABS
4.0000 mg | ORAL_TABLET | Freq: Three times a day (TID) | ORAL | Status: DC | PRN
Start: 2014-09-27 — End: 2015-07-21

## 2014-09-27 MED ORDER — PROMETHAZINE HCL 25 MG/ML IJ SOLN
25.0000 mg | Freq: Once | INTRAMUSCULAR | Status: AC
  Administered 2014-09-27: 25 mg via INTRAMUSCULAR
  Filled 2014-09-27: qty 1

## 2014-09-27 MED ORDER — HYDROMORPHONE HCL 1 MG/ML IJ SOLN
1.0000 mg | Freq: Once | INTRAMUSCULAR | Status: AC
  Administered 2014-09-27: 1 mg via INTRAMUSCULAR
  Filled 2014-09-27: qty 1

## 2014-09-27 NOTE — ED Notes (Signed)
Pt had site lanced on RLE. Pt reports chills ever since. Site dressed upon arrival to triage. Dressing removed. Moderate redness noted to site. Pt reports "it is getting worse." nad noted.

## 2014-09-27 NOTE — ED Provider Notes (Signed)
CSN: [id number]     Arrival date & time 09/27/14  1616 History   First MD Initiated Contact with Patient 09/27/14 1734     Chief Complaint  Patient presents with  . Wound Check     (Consider location/radiation/quality/duration/timing/severity/associated sxs/prior Treatment) Patient is a 49 y.o. male presenting with leg pain.  Leg Pain Location:  Leg Time since incident:  4 days Injury: no   Leg location:  R lower leg Pain details:    Quality:  Aching and sharp   Radiates to:  Does not radiate   Severity:  Moderate   Onset quality:  Gradual   Timing:  Constant   Progression:  Improving Chronicity:  New   Past Medical History  Diagnosis Date  . Hepatitis C   . Hiatal hernia   . Acid reflux    Past Surgical History  Procedure Laterality Date  . Facial fracture surgery     Family History  Problem Relation Age of Onset  . Diabetes Mother   . Hypertension Mother   . Hypertension Father    Social History  Substance Use Topics  . Smoking status: Current Every Day Smoker -- 1.00 packs/day for 20 years  . Smokeless tobacco: None  . Alcohol Use: No    Review of Systems  Constitutional: Negative for chills and appetite change.  Gastrointestinal: Positive for nausea and vomiting.  Skin: Positive for rash and wound.  All other systems reviewed and are negative.     Allergies  Review of patient's allergies indicates no known allergies.  Home Medications   Prior to Admission medications   Medication Sig Start Date End Date Taking? Authorizing Provider  cephALEXin (KEFLEX) 500 MG capsule Take 1 capsule (500 mg total) by mouth 4 (four) times daily. 09/25/14   Melene Plan, DO  ondansetron (ZOFRAN) 4 MG tablet Take 1 tablet (4 mg total) by mouth every 8 (eight) hours as needed for nausea or vomiting. 09/27/14   Marily Memos, MD  oxyCODONE-acetaminophen (PERCOCET) 5-325 MG per tablet Take 2 tablets by mouth every 4 (four) hours as needed. 09/27/14   Marily Memos, MD    ranitidine (ZANTAC) 75 MG tablet Take 75 mg by mouth 2 (two) times daily.    Historical Provider, MD   BP 125/94 mmHg  Pulse 97  Temp(Src) 98.2 F (36.8 C) (Oral)  Resp 16  Ht 5\' 10"  (1.778 m)  Wt 170 lb (77.111 kg)  BMI 24.39 kg/m2  SpO2 99% Physical Exam  Constitutional: He is oriented to person, place, and time. He appears well-developed and well-nourished.  HENT:  Head: Normocephalic and atraumatic.  Eyes: Conjunctivae and EOM are normal.  Neck: Normal range of motion. Neck supple.  Cardiovascular: Regular rhythm.  Tachycardia present.   Pulmonary/Chest: Effort normal. No respiratory distress.  Abdominal: Soft. There is no tenderness.  Musculoskeletal: Normal range of motion. He exhibits no edema or tenderness.  Neurological: He is alert and oriented to person, place, and time.  Skin: Skin is warm and dry.     See below for wound  Nursing note and vitals reviewed.          ED Course  Procedures (including critical care time) Labs Review Labs Reviewed - No data to display  Imaging Review No results found. I have personally reviewed and evaluated these images and lab results as part of my medical decision-making.   EKG Interpretation None      MDM   Final diagnoses:  Cellulitis of right lower extremity  49 yo M w/ abscess I&D a couple days ago, started abx yesterday, worsening pain and drainage with some vomiting. Tolerating PO now. Tachy on exam, otherwise cellulitis as above. Marked with skin marker. Tachy likely 2/2 pain. Doubt sepsis at this time without fever or other VS abnormalities. Will redress wound, continue antibiotics (Not enough doses to consider it outpatient failure) and return here tomorrow or Sunday for wound recheck.   I have personally and contemperaneously reviewed labs and imaging and used in my decision making as above.   A medical screening exam was performed and I feel the patient has had an appropriate workup for their chief  complaint at this time and likelihood of emergent condition existing is low. They have been counseled on decision, discharge, follow up and which symptoms necessitate immediate return to the emergency department. They or their family verbally stated understanding and agreement with plan and discharged in stable condition.      Marily Memos, MD 09/27/14 [phone]

## 2014-09-27 NOTE — ED Notes (Signed)
Minimal drainage noted from site. Site re-dressed with telfa and conform wrapping, secured wrapping in place by tape. Pt tolerated well.

## 2014-09-30 ENCOUNTER — Encounter (HOSPITAL_COMMUNITY): Payer: Self-pay | Admitting: *Deleted

## 2014-09-30 ENCOUNTER — Emergency Department (HOSPITAL_COMMUNITY): Payer: Self-pay

## 2014-09-30 ENCOUNTER — Emergency Department (HOSPITAL_COMMUNITY)
Admission: EM | Admit: 2014-09-30 | Discharge: 2014-09-30 | Disposition: A | Discharge status: Home / Self Care | Payer: Self-pay | Attending: Emergency Medicine | Admitting: Emergency Medicine

## 2014-09-30 DIAGNOSIS — Z792 Long term (current) use of antibiotics: Secondary | ICD-10-CM | POA: Insufficient documentation

## 2014-09-30 DIAGNOSIS — Z8619 Personal history of other infectious and parasitic diseases: Secondary | ICD-10-CM | POA: Insufficient documentation

## 2014-09-30 DIAGNOSIS — Z79899 Other long term (current) drug therapy: Secondary | ICD-10-CM | POA: Insufficient documentation

## 2014-09-30 DIAGNOSIS — L0291 Cutaneous abscess, unspecified: Secondary | ICD-10-CM

## 2014-09-30 DIAGNOSIS — L02415 Cutaneous abscess of right lower limb: Secondary | ICD-10-CM | POA: Insufficient documentation

## 2014-09-30 DIAGNOSIS — L0390 Cellulitis, unspecified: Secondary | ICD-10-CM

## 2014-09-30 DIAGNOSIS — Z720 Tobacco use: Secondary | ICD-10-CM | POA: Insufficient documentation

## 2014-09-30 DIAGNOSIS — K219 Gastro-esophageal reflux disease without esophagitis: Secondary | ICD-10-CM | POA: Insufficient documentation

## 2014-09-30 DIAGNOSIS — L03115 Cellulitis of right lower limb: Secondary | ICD-10-CM | POA: Insufficient documentation

## 2014-09-30 LAB — BASIC METABOLIC PANEL
ANION GAP: 7 (ref 5–15)
BUN: 15 mg/dL (ref 6–20)
CHLORIDE: 104 mmol/L (ref 101–111)
CO2: 27 mmol/L (ref 22–32)
Calcium: 9 mg/dL (ref 8.9–10.3)
Creatinine, Ser: 0.83 mg/dL (ref 0.61–1.24)
Glucose, Bld: 92 mg/dL (ref 65–99)
POTASSIUM: 4.1 mmol/L (ref 3.5–5.1)
SODIUM: 138 mmol/L (ref 135–145)

## 2014-09-30 LAB — CBC WITH DIFFERENTIAL/PLATELET
BASOS ABS: 0.1 10*3/uL (ref 0.0–0.1)
BASOS PCT: 1 % (ref 0–1)
EOS ABS: 0.2 10*3/uL (ref 0.0–0.7)
EOS PCT: 2 % (ref 0–5)
HCT: 41.9 % (ref 39.0–52.0)
HEMOGLOBIN: 14.4 g/dL (ref 13.0–17.0)
LYMPHS ABS: 2.3 10*3/uL (ref 0.7–4.0)
Lymphocytes Relative: 26 % (ref 12–46)
MCH: 32.7 pg (ref 26.0–34.0)
MCHC: 34.4 g/dL (ref 30.0–36.0)
MCV: 95.2 fL (ref 78.0–100.0)
Monocytes Absolute: 0.7 10*3/uL (ref 0.1–1.0)
Monocytes Relative: 8 % (ref 3–12)
NEUTROS PCT: 63 % (ref 43–77)
Neutro Abs: 5.6 10*3/uL (ref 1.7–7.7)
PLATELETS: 374 10*3/uL (ref 150–400)
RBC: 4.4 MIL/uL (ref 4.22–5.81)
RDW: 12.7 % (ref 11.5–15.5)
WBC: 8.8 10*3/uL (ref 4.0–10.5)

## 2014-09-30 LAB — CBG MONITORING, ED: GLUCOSE-CAPILLARY: 96 mg/dL (ref 65–99)

## 2014-09-30 MED ORDER — HYDROCODONE-ACETAMINOPHEN 5-325 MG PO TABS
2.0000 | ORAL_TABLET | Freq: Once | ORAL | Status: AC
  Administered 2014-09-30: 2 via ORAL
  Filled 2014-09-30: qty 2

## 2014-09-30 MED ORDER — LIDOCAINE-EPINEPHRINE (PF) 1 %-1:200000 IJ SOLN
20.0000 mL | Freq: Once | INTRAMUSCULAR | Status: AC
  Administered 2014-09-30: 20 mL
  Filled 2014-09-30: qty 20

## 2014-09-30 MED ORDER — VANCOMYCIN HCL IN DEXTROSE 1-5 GM/200ML-% IV SOLN
1000.0000 mg | Freq: Once | INTRAVENOUS | Status: AC
  Administered 2014-09-30: 1000 mg via INTRAVENOUS
  Filled 2014-09-30: qty 200

## 2014-09-30 MED ORDER — SULFAMETHOXAZOLE-TRIMETHOPRIM 800-160 MG PO TABS: 1.0000 | ORAL_TABLET | Freq: Two times a day (BID) | ORAL | Status: AC

## 2014-09-30 NOTE — ED Provider Notes (Signed)
CSN: [id number]     Arrival date & time 09/30/14  1929 History   First MD Initiated Contact with Patient 09/30/14 2003     Chief Complaint  Patient presents with  . Wound Check     (Consider location/radiation/quality/duration/timing/severity/associated sxs/prior Treatment) HPI Comments: Recheck of right shin abscess that was drained on August 17. Taking Keflex only, day 5 of 7. Denies fever or vomiting. States redness is improving the pain persists. Denies any weakness, numbness or tingling. There is still some drainage and bleeding. Seen in the ED on the 19th and wound was improving at that time. He is not diabetic. Denies any IV drug abuse.  Patient is a 49 y.o. male presenting with wound check. The history is provided by the patient.  Wound Check This is a recurrent problem. The current episode started more than 2 days ago. The problem occurs constantly. The problem has been gradually improving. Pertinent negatives include no chest pain, no abdominal pain, no headaches and no shortness of breath.    Past Medical History  Diagnosis Date  . Hepatitis C   . Hiatal hernia   . Acid reflux    Past Surgical History  Procedure Laterality Date  . Facial fracture surgery     Family History  Problem Relation Age of Onset  . Diabetes Mother   . Hypertension Mother   . Hypertension Father    Social History  Substance Use Topics  . Smoking status: Current Every Day Smoker -- 1.00 packs/day for 20 years  . Smokeless tobacco: None  . Alcohol Use: No    Review of Systems  Constitutional: Negative for fever, activity change and appetite change.  HENT: Negative for congestion and rhinorrhea.   Respiratory: Negative for cough, chest tightness and shortness of breath.   Cardiovascular: Negative for chest pain and palpitations.  Gastrointestinal: Negative for nausea, vomiting and abdominal pain.  Genitourinary: Negative for dysuria.  Musculoskeletal: Negative for myalgias and  arthralgias.  Skin: Positive for wound.  Neurological: Negative for dizziness, weakness and headaches.  A complete 10 system review of systems was obtained and all systems are negative except as noted in the HPI and PMH.      Allergies  Review of patient's allergies indicates no known allergies.  Home Medications   Prior to Admission medications   Medication Sig Start Date End Date Taking? Authorizing Provider  cephALEXin (KEFLEX) 500 MG capsule Take 1 capsule (500 mg total) by mouth 4 (four) times daily. 09/25/14  Yes Melene Plan, DO  oxyCODONE-acetaminophen (PERCOCET) 5-325 MG per tablet Take 2 tablets by mouth every 4 (four) hours as needed. 09/27/14  Yes Marily Memos, MD  ranitidine (ZANTAC) 75 MG tablet Take 75 mg by mouth 2 (two) times daily.   Yes Historical Provider, MD  ondansetron (ZOFRAN) 4 MG tablet Take 1 tablet (4 mg total) by mouth every 8 (eight) hours as needed for nausea or vomiting. Patient not taking: Reported on 09/30/2014 09/27/14   Marily Memos, MD   BP 123/95 mmHg  Pulse 82  Temp(Src) 98.3 F (36.8 C) (Oral)  Resp 20  Ht  (1.778 m)  Wt 170 lb (77.111 kg)  BMI 24.39 kg/m2  SpO2 100% Physical Exam  Constitutional: He is oriented to person, place, and time. He appears well-developed and well-nourished. No distress.  HENT:  Head: Normocephalic and atraumatic.  Mouth/Throat: Oropharynx is clear and moist. No oropharyngeal exudate.  Eyes: Conjunctivae and EOM are normal. Pupils are equal, round, and reactive to  light.  Neck: Normal range of motion. Neck supple.  Cardiovascular: Normal rate, regular rhythm and normal heart sounds.   Pulmonary/Chest: Effort normal and breath sounds normal. No respiratory distress.  Abdominal: Soft. There is no tenderness. There is no rebound and no guarding.  Musculoskeletal: Normal range of motion. He exhibits tenderness. He exhibits no edema.  Wound as depicted. There is some purulent drainage from the incision site.  Erythema slightly better than previous images.  Neurological: He is alert and oriented to person, place, and time. No cranial nerve deficit. He exhibits normal muscle tone. Coordination normal.  Skin: Skin is warm.        ED Course  INCISION AND DRAINAGE Date/Time: 09/30/2014 9:28 PM Performed by: Glynn Octave Authorized by: Glynn Octave Consent: Verbal consent obtained. Risks and benefits: risks, benefits and alternatives were discussed Consent given by: patient Patient understanding: patient states understanding of the procedure being performed Patient identity confirmed: verbally with patient and provided demographic data Type: abscess Body area: lower extremity Location details: right leg Anesthesia: local infiltration Local anesthetic: lidocaine 2% with epinephrine Anesthetic total: 5 ml Patient sedated: no Scalpel size: 11 Incision type: single straight Complexity: complex Drainage: purulent Drainage amount: moderate Wound treatment: wound left open Patient tolerance: Patient tolerated the procedure well with no immediate complications   (including critical care time) Labs Review Labs Reviewed - No data to display  Imaging Review No results found. I have personally reviewed and evaluated these images and lab results as part of my medical decision-making.   EKG Interpretation None      MDM   Final diagnoses:  None   Recheck of abscess and cellulitis of right leg. No fever or vomiting. No systemic symptoms. Redness appears to be receding from recently drawn line. Non toxic appearing.  There is some purulent material at abscess site.  Wound was reopened as above. Wound culture was taken. MRSA coverage will be added with Bactrim.  IV vancomycin given in the ED.  No evidence of deep space infection or osteomyelitis on CT. Admission offered which patient declines.  Will see if wound improves with MRSA coverage. He is willing to return for a wound  check in 48 hours, sooner if he has worsening symptoms.     Glynn Octave, MD 09/30/14 [phone]

## 2014-09-30 NOTE — ED Notes (Signed)
Pt here to have right leg rechecked; pt states the swelling had decreased but it is still very painful

## 2014-09-30 NOTE — Discharge Instructions (Signed)
Abscess Add Bactrim to the Keflex. Return in 2 days for a wound check. Return to the ED if you develop worsening symptoms. An abscess is an infected area that contains a collection of pus and debris.It can occur in almost any part of the body. An abscess is also known as a furuncle or boil. CAUSES  An abscess occurs when tissue gets infected. This can occur from blockage of oil or sweat glands, infection of hair follicles, or a minor injury to the skin. As the body tries to fight the infection, pus collects in the area and creates pressure under the skin. This pressure causes pain. People with weakened immune systems have difficulty fighting infections and get certain abscesses more often.  SYMPTOMS Usually an abscess develops on the skin and becomes a painful mass that is red, warm, and tender. If the abscess forms under the skin, you may feel a moveable soft area under the skin. Some abscesses break open (rupture) on their own, but most will continue to get worse without care. The infection can spread deeper into the body and eventually into the bloodstream, causing you to feel ill.  DIAGNOSIS  Your caregiver will take your medical history and perform a physical exam. A sample of fluid may also be taken from the abscess to determine what is causing your infection. TREATMENT  Your caregiver may prescribe antibiotic medicines to fight the infection. However, taking antibiotics alone usually does not cure an abscess. Your caregiver may need to make a small cut (incision) in the abscess to drain the pus. In some cases, gauze is packed into the abscess to reduce pain and to continue draining the area. HOME CARE INSTRUCTIONS   Only take over-the-counter or prescription medicines for pain, discomfort, or fever as directed by your caregiver.  If you were prescribed antibiotics, take them as directed. Finish them even if you start to feel better.  If gauze is used, follow your caregiver's directions for  changing the gauze.  To avoid spreading the infection:  Keep your draining abscess covered with a bandage.  Wash your hands well.  Do not share personal care items, towels, or whirlpools with others.  Avoid skin contact with others.  Keep your skin and clothes clean around the abscess.  Keep all follow-up appointments as directed by your caregiver. SEEK MEDICAL CARE IF:   You have increased pain, swelling, redness, fluid drainage, or bleeding.  You have muscle aches, chills, or a general ill feeling.  You have a fever. MAKE SURE YOU:   Understand these instructions.  Will watch your condition.  Will get help right away if you are not doing well or get worse. Document Released: 11/04/2004 Document Revised: 07/27/2011 Document Reviewed: 04/09/2011 [facility] Patient Information 2015 [place], [place]. This information is not intended to replace advice given to you by your health care provider. Make sure you discuss any questions you have with your health care provider.

## 2014-10-03 LAB — WOUND CULTURE

## 2014-10-04 ENCOUNTER — Telehealth (HOSPITAL_BASED_OUTPATIENT_CLINIC_OR_DEPARTMENT_OTHER): Payer: Self-pay | Admitting: Emergency Medicine

## 2014-10-04 NOTE — Telephone Encounter (Signed)
Post ED Visit - Positive Culture Follow-up  Culture report reviewed by antimicrobial stewardship pharmacist:  Wes Dulaney, Pharm.D., BCPS  Celedonio Miyamoto, Pharm.D., BCPS  Georgina Pillion, Pharm.D., BCPS  [place], [address].D., BCPS, AAHIVP  Estella Husk, Pharm.D., BCPS, AAHIVP  Elder Cyphers, [address].D., BCPS Casilda Carls PharmD  Positive wound  Culture Enterobacter Treated with bactrim DS, organism sensitive to the same and no further patient follow-up is required at this time.  Berle Mull 10/04/2014, 9:18 AM

## 2015-07-21 ENCOUNTER — Encounter (HOSPITAL_COMMUNITY): Payer: Self-pay | Admitting: Emergency Medicine

## 2015-07-21 ENCOUNTER — Emergency Department (HOSPITAL_COMMUNITY)
Admission: EM | Admit: 2015-07-21 | Discharge: 2015-07-21 | Disposition: A | Discharge status: Home / Self Care | Payer: MEDICAID | Attending: Emergency Medicine | Admitting: Emergency Medicine

## 2015-07-21 DIAGNOSIS — F17210 Nicotine dependence, cigarettes, uncomplicated: Secondary | ICD-10-CM | POA: Insufficient documentation

## 2015-07-21 DIAGNOSIS — Z791 Long term (current) use of non-steroidal anti-inflammatories (NSAID): Secondary | ICD-10-CM | POA: Insufficient documentation

## 2015-07-21 DIAGNOSIS — Z79899 Other long term (current) drug therapy: Secondary | ICD-10-CM | POA: Insufficient documentation

## 2015-07-21 DIAGNOSIS — L739 Follicular disorder, unspecified: Secondary | ICD-10-CM | POA: Insufficient documentation

## 2015-07-21 DIAGNOSIS — L663 Perifolliculitis capitis abscedens: Secondary | ICD-10-CM

## 2015-07-21 MED ORDER — DOXYCYCLINE HYCLATE 100 MG PO CAPS: 100.0000 mg | ORAL_CAPSULE | Freq: Two times a day (BID) | ORAL | Status: DC

## 2015-07-21 MED ORDER — NAPROXEN 500 MG PO TABS: 500.0000 mg | ORAL_TABLET | Freq: Two times a day (BID) | ORAL | Status: DC

## 2015-07-21 MED ORDER — HYDROXYZINE HCL 25 MG PO TABS: 25.0000 mg | ORAL_TABLET | Freq: Four times a day (QID) | ORAL | Status: DC

## 2015-07-21 NOTE — ED Notes (Signed)
Pt states he has several abscesses in his hair.

## 2015-07-21 NOTE — ED Provider Notes (Signed)
CSN: [id number]     Arrival date & time 07/21/15  1137 History  By signing my name below, I, [name], attest that this documentation has been prepared under the direction and in the presence of [name] A [name], PA-C. Electronically Signed: Ronney Chan, ED Scribe. 07/21/2015. 12:23 PM.    Chief Complaint  Patient presents with  . Abscess   The history is provided by the patient. No language interpreter was used.    HPI Comments: [name] is a 50 y.o. male with a history of hepatitis C, who presents to the Emergency Department complaining of multiple small, pruritic, painful, "knots" on his posterior scalp that began 3 weeks ago. He states the areas were initially itchy but became increasingly painful; he notes he has also been scratching the areas excessively. He denies any recent haircuts or hair products. Patient states he had taken Benadryl for itching, with some relief.   Past Medical History  Diagnosis Date  . Hepatitis C   . Hiatal hernia   . Acid reflux    Past Surgical History  Procedure Laterality Date  . Facial fracture surgery     Family History  Problem Relation Age of Onset  . Diabetes Mother   . Hypertension Mother   . Hypertension Father    Social History  Substance Use Topics  . Smoking status: Current Every Day Smoker -- 1.00 packs/day for 20 years    Types: Cigarettes  . Smokeless tobacco: None  . Alcohol Use: No    Review of Systems  Constitutional: Negative for fever and chills.  Skin:       Positive for pruritic, painful, small bumps on posterior scalp.   Allergies  Review of patient's allergies indicates no known allergies.  Home Medications   Prior to Admission medications   Medication Sig Start Date End Date Taking? Authorizing Provider  diphenhydrAMINE (BENADRYL) 25 mg capsule Take 25 mg by mouth every 6 (six) hours as needed.   Yes Historical Provider, MD  ibuprofen (ADVIL,MOTRIN) 200 MG tablet Take 400 mg by mouth every 6 (six)  hours as needed.   Yes Historical Provider, MD   BP 146/87 mmHg  Pulse 99  Temp(Src) 98.2 F (36.8 C) (Oral)  Resp 18  Ht  (1.778 m)  Wt 165 lb (74.844 kg)  BMI 23.68 kg/m2  SpO2 98% Physical Exam  Constitutional: He is oriented to person, place, and time. He appears well-developed and well-nourished. No distress.  HENT:  Head: Normocephalic and atraumatic.  Eyes: Conjunctivae and EOM are normal.  Neck: Neck supple. No tracheal deviation present.  Cardiovascular: Normal rate.   Pulmonary/Chest: Effort normal. No respiratory distress.  Musculoskeletal: Normal range of motion.  Neurological: He is alert and oriented to person, place, and time.  Skin: Skin is warm and dry.  Multiple pustules to the base of the scalp. Tender to palpation. No cellulitic changes. Some induration noted.  Psychiatric: He has a normal mood and affect. His behavior is normal.  Nursing note and vitals reviewed.   ED Course  Procedures (including critical care time)  DIAGNOSTIC STUDIES: Oxygen Saturation is 98% on RA, normal by my interpretation.    COORDINATION OF CARE: 12:21 PM - Suspect folliculitis. Discussed treatment plan with pt at bedside which includes warm compresses. Pt verbalized understanding and agreed to plan.   MDM   Final diagnoses:  Dissecting folliculitis of scalp   Patient with multiple pustules to the posterior scalp, right at the hairline. Was consistent with  folliculitis. Patient has never had this before. I do not think any of these need to be incised and drained. Will start on antibiotic, doxycycline. Topical steroids. Wash with several water. Follow-up as needed.  Filed Vitals:   07/21/15 1142  BP: 146/87  Pulse: 99  Temp: 98.2 F (36.8 C)  TempSrc: Oral  Resp: 18  Height: 5\' 10"  (1.778 m)  Weight: 74.844 kg  SpO2: 98%   I personally performed the services described in this documentation, which was scribed in my presence. The recorded information has been  reviewed and is accurate.    [name], PA-C 07/21/15 1456  Blane OharaJoshua Zavitz, MD 07/21/15 1601

## 2015-07-21 NOTE — Discharge Instructions (Signed)
Take naproxen for pain. Vistaril for itching. Take doxycycline as prescribed until all gone for infection. He can try applying hydrocortisone cream topically. Follow with your doctor.   Folliculitis Folliculitis is redness, soreness, and swelling (inflammation) of the hair follicles. This condition can occur anywhere on the body. People with weakened immune systems, diabetes, or obesity have a greater risk of getting folliculitis. CAUSES  Bacterial infection. This is the most common cause.  Fungal infection.  Viral infection.  Contact with certain chemicals, especially oils and tars. Long-term folliculitis can result from bacteria that live in the nostrils. The bacteria may trigger multiple outbreaks of folliculitis over time. SYMPTOMS Folliculitis most commonly occurs on the scalp, thighs, legs, back, buttocks, and areas where hair is shaved frequently. An early sign of folliculitis is a small, white or yellow, pus-filled, itchy lesion (pustule). These lesions appear on a red, inflamed follicle. They are usually less than 0.2 inches (5 mm) wide. When there is an infection of the follicle that goes deeper, it becomes a boil or furuncle. A group of closely packed boils creates a larger lesion (carbuncle). Carbuncles tend to occur in hairy, sweaty areas of the body. DIAGNOSIS  Your caregiver can usually tell what is wrong by doing a physical exam. A sample may be taken from one of the lesions and tested in a lab. This can help determine what is causing your folliculitis. TREATMENT  Treatment may include:  Applying warm compresses to the affected areas.  Taking antibiotic medicines orally or applying them to the skin.  Draining the lesions if they contain a large amount of pus or fluid.  Laser hair removal for cases of long-lasting folliculitis. This helps to prevent regrowth of the hair. HOME CARE INSTRUCTIONS  Apply warm compresses to the affected areas as directed by your  caregiver.  If antibiotics are prescribed, take them as directed. Finish them even if you start to feel better.  You may take over-the-counter medicines to relieve itching.  Do not shave irritated skin.  Follow up with your caregiver as directed. SEEK IMMEDIATE MEDICAL CARE IF:   You have increasing redness, swelling, or pain in the affected area.  You have a fever. MAKE SURE YOU:  Understand these instructions.  Will watch your condition.  Will get help right away if you are not doing well or get worse.   This information is not intended to replace advice given to you by your health care provider. Make sure you discuss any questions you have with your health care provider.   Document Released: 04/05/2001 Document Revised: 02/15/2014 Document Reviewed: 04/27/2011 [employer] Interactive Patient Education [employer]2016 [employer]

## 2015-07-27 ENCOUNTER — Encounter (HOSPITAL_COMMUNITY): Payer: Self-pay | Admitting: Emergency Medicine

## 2015-07-27 ENCOUNTER — Emergency Department (HOSPITAL_COMMUNITY)
Admission: EM | Admit: 2015-07-27 | Discharge: 2015-07-27 | Disposition: A | Discharge status: Home / Self Care | Payer: MEDICAID | Attending: Emergency Medicine | Admitting: Emergency Medicine

## 2015-07-27 DIAGNOSIS — F17210 Nicotine dependence, cigarettes, uncomplicated: Secondary | ICD-10-CM | POA: Insufficient documentation

## 2015-07-27 DIAGNOSIS — L0100 Impetigo, unspecified: Secondary | ICD-10-CM

## 2015-07-27 DIAGNOSIS — L739 Follicular disorder, unspecified: Secondary | ICD-10-CM | POA: Insufficient documentation

## 2015-07-27 MED ORDER — DOXYCYCLINE HYCLATE 100 MG PO TABS
100.0000 mg | ORAL_TABLET | Freq: Once | ORAL | Status: AC
  Administered 2015-07-27: 100 mg via ORAL
  Filled 2015-07-27: qty 1

## 2015-07-27 MED ORDER — DEXAMETHASONE SODIUM PHOSPHATE 4 MG/ML IJ SOLN
8.0000 mg | Freq: Once | INTRAMUSCULAR | Status: AC
  Administered 2015-07-27: 8 mg via INTRAMUSCULAR
  Filled 2015-07-27: qty 2

## 2015-07-27 MED ORDER — CEFTRIAXONE SODIUM 1 G IJ SOLR
1.0000 g | Freq: Once | INTRAMUSCULAR | Status: AC
  Administered 2015-07-27: 1 g via INTRAMUSCULAR
  Filled 2015-07-27: qty 10

## 2015-07-27 MED ORDER — HYDROCODONE-ACETAMINOPHEN 5-325 MG PO TABS: 1.0000 | ORAL_TABLET | ORAL | Status: DC | PRN

## 2015-07-27 MED ORDER — HYDROCODONE-ACETAMINOPHEN 5-325 MG PO TABS
2.0000 | ORAL_TABLET | Freq: Once | ORAL | Status: AC
  Administered 2015-07-27: 2 via ORAL
  Filled 2015-07-27: qty 2

## 2015-07-27 MED ORDER — SULFAMETHOXAZOLE-TRIMETHOPRIM 800-160 MG PO TABS: 1.0000 | ORAL_TABLET | Freq: Two times a day (BID) | ORAL | Status: AC

## 2015-07-27 MED ORDER — LIDOCAINE HCL (PF) 1 % IJ SOLN
INTRAMUSCULAR | Status: AC
  Administered 2015-07-27: 2.1 mL
  Filled 2015-07-27: qty 5

## 2015-07-27 MED ORDER — ONDANSETRON HCL 4 MG PO TABS
4.0000 mg | ORAL_TABLET | Freq: Once | ORAL | Status: AC
Start: 2015-07-27 — End: 2015-07-27
  Administered 2015-07-27: 4 mg via ORAL
  Filled 2015-07-27: qty 1

## 2015-07-27 MED ORDER — SELENIUM SULFIDE 1 % EX LOTN: 1.0000 "application " | TOPICAL_LOTION | Freq: Every day | CUTANEOUS | Status: DC

## 2015-07-27 NOTE — ED Provider Notes (Signed)
CSN: [id number]     Arrival date & time 07/27/15  1343 History   By signing my name below, I, Jasmyn B. Alexander, attest that this documentation has been prepared under the direction and in the presence of [employer].  Electronically Signed: Gillis Ends. Lyn Hollingshead, ED Scribe. 07/27/2015. 2:40 PM.     Chief Complaint  Patient presents with  . Rash   The history is provided by the patient. No language interpreter was used.   HPI Comments: [name] is a 50 y.o. male with PMHx of Hep C who presents to the Emergency Department complaining of multiple small and worsening painful and burning "knots" located on the posterior neck x 3 days. Pt states he was seen on 07/21/15 in [facility] for similar chief complaint that began 3 weeks prior but the rash has now spread to his scalp, fingers, and around his eyes. Pt was prescribed Doxycycline by Jaynie Crumble, PA-C but he was unable to get prescription filled due to cost. He reports that pain is exacerbated when exposed to sunlight. He states he is unable to lay supine due to rash on posterior neck. He denies any use of medications to relieve symptoms.   Past Medical History  Diagnosis Date  . Hepatitis C   . Hiatal hernia   . Acid reflux    Past Surgical History  Procedure Laterality Date  . Facial fracture surgery     Family History  Problem Relation Age of Onset  . Diabetes Mother   . Hypertension Mother   . Hypertension Father    Social History  Substance Use Topics  . Smoking status: Current Every Day Smoker -- 1.00 packs/day for 20 years    Types: Cigarettes  . Smokeless tobacco: None  . Alcohol Use: No    Review of Systems  Constitutional: Negative for fever.  Skin: Positive for rash.  All other systems reviewed and are negative.  Allergies  Review of patient's allergies indicates no known allergies.  Home Medications   Prior to Admission medications   Medication Sig Start Date End Date Taking? Authorizing Provider   diphenhydrAMINE (BENADRYL) 25 mg capsule Take 25 mg by mouth every 6 (six) hours as needed.    Historical Provider, MD  doxycycline (VIBRAMYCIN) 100 MG capsule Take 1 capsule (100 mg total) by mouth 2 (two) times daily. 07/21/15   Tatyana Kirichenko, PA-C  hydrOXYzine (ATARAX/VISTARIL) 25 MG tablet Take 1 tablet (25 mg total) by mouth every 6 (six) hours. 07/21/15   Tatyana Kirichenko, PA-C  ibuprofen (ADVIL,MOTRIN) 200 MG tablet Take 400 mg by mouth every 6 (six) hours as needed.    Historical Provider, MD  naproxen (NAPROSYN) 500 MG tablet Take 1 tablet (500 mg total) by mouth 2 (two) times daily. 07/21/15   Tatyana Kirichenko, PA-C   BP 135/87 mmHg  Pulse 103  Temp(Src) 97.8 F (36.6 C) (Oral)  Resp 18  Ht  (1.778 m)  Wt 165 lb (74.844 kg)  BMI 23.68 kg/m2  SpO2 100% Physical Exam  Constitutional: He is oriented to person, place, and time. He appears well-developed and well-nourished.  HENT:  Head: Normocephalic and atraumatic.  Several raw areas of the scalp. Several areas of increased redness. Scalp is tender to palpation. No red streaks appreciated. Lesions of the face.   Eyes: EOM are normal. Pupils are equal, round, and reactive to light.  Neck: Normal range of motion.  Posterior neck with macular red rash. Some with open areas. Increased redness present.  Palpable lymph nodes present.  Cardiovascular: Normal rate, regular rhythm and normal heart sounds.   Pulmonary/Chest: Effort normal and breath sounds normal. No respiratory distress. He has no wheezes. He has no rales.  Abdominal: Soft. Bowel sounds are normal. He exhibits no distension. There is no tenderness. There is no rebound and no guarding.  Musculoskeletal: Normal range of motion.  Neurological: He is alert and oriented to person, place, and time.  Skin: Skin is warm and dry. No rash noted.  Psychiatric: He has a normal mood and affect. Judgment normal.  Nursing note and vitals reviewed.  ED Course  Procedures  (including critical care time) DIAGNOSTIC STUDIES: Oxygen Saturation is 100% on RA, normal by my interpretation.    COORDINATION OF CARE: 2:24 PM-Discussed treatment plan which includes order of Decadron, Rocephin, Norco, and Zofran with pt at bedside and pt agreed to plan.   MDM  Vital signs reviewed.  The patient acknowledges that he did not get his previous prescription filled, because he could not afford it. The examination favors a folliculitis, and impetigo. Patient in no distress.  Prescription [facility] to the patient [facility] and Norco. Patient advised to follow with his primary physician or the free clinic of [facility]. Patient acknowledges understanding of these instructions.    Final diagnoses:  None    **I have reviewed nursing notes, vital signs, and all appropriate lab and imaging results for this patient.*  **I personally performed the services described in this documentation, which was scribed in my presence. The recorded information has been reviewed and is accurate.Ivery Quale*   Francenia Chimenti, PA-C 07/28/15 1231  Donnetta HutchingBrian Cook, MD 07/28/15 [id number]

## 2015-07-27 NOTE — Discharge Instructions (Signed)
Folliculitis Folliculitis is redness, soreness, and swelling (inflammation) of the hair follicles. This condition can occur anywhere on the body. People with weakened immune systems, diabetes, or obesity have a greater risk of getting folliculitis. CAUSES  Bacterial infection. This is the most common cause.  Fungal infection.  Viral infection.  Contact with certain chemicals, especially oils and tars. Long-term folliculitis can result from bacteria that live in the nostrils. The bacteria may trigger multiple outbreaks of folliculitis over time. SYMPTOMS Folliculitis most commonly occurs on the scalp, thighs, legs, back, buttocks, and areas where hair is shaved frequently. An early sign of folliculitis is a small, white or yellow, pus-filled, itchy lesion (pustule). These lesions appear on a red, inflamed follicle. They are usually less than 0.2 inches (5 mm) wide. When there is an infection of the follicle that goes deeper, it becomes a boil or furuncle. A group of closely packed boils creates a larger lesion (carbuncle). Carbuncles tend to occur in hairy, sweaty areas of the body. DIAGNOSIS  Your caregiver can usually tell what is wrong by doing a physical exam. A sample may be taken from one of the lesions and tested in a lab. This can help determine what is causing your folliculitis. TREATMENT  Treatment may include:  Applying warm compresses to the affected areas.  Taking antibiotic medicines orally or applying them to the skin.  Draining the lesions if they contain a large amount of pus or fluid.  Laser hair removal for cases of long-lasting folliculitis. This helps to prevent regrowth of the hair. HOME CARE INSTRUCTIONS  Apply warm compresses to the affected areas as directed by your caregiver.  If antibiotics are prescribed, take them as directed. Finish them even if you start to feel better.  You may take over-the-counter medicines to relieve itching.  Do not shave irritated  skin.  Follow up with your caregiver as directed. SEEK IMMEDIATE MEDICAL CARE IF:   You have increasing redness, swelling, or pain in the affected area.  You have a fever. MAKE SURE YOU:  Understand these instructions.  Will watch your condition.  Will get help right away if you are not doing well or get worse.   This information is not intended to replace advice given to you by your health care provider. Make sure you discuss any questions you have with your health care provider.   Document Released: 04/05/2001 Document Revised: 02/15/2014 Document Reviewed: 04/27/2011 Elsevier Interactive Patient Education 2016 Elsevier Inc.  Impetigo, Adult Impetigo is an infection of the skin. It commonly occurs in young children, but it can also occur in adults. The infection causes itchy blisters and sores that produce brownish-yellow fluid. As the fluid dries, it forms a thick, honey-colored crust. These skin changes usually occur on the face but can also affect other areas of the body. Impetigo usually goes away in 7-10 days with treatment. CAUSES Impetigo is caused by two types of bacteria. It may be caused by staphylococci or streptococci bacteria. These bacteria cause impetigo when they get under the surface of the skin. This often happens after some damage to the skin, such as damage from:  Cuts, scrapes, or scratches.  Insect bites, especially when you scratch the area of a bite.  Chickenpox or other illnesses that cause open skin sores.  Nail biting or chewing. Impetigo is contagious and can spread easily from one person to another. This may occur through close skin contact or by sharing towels, clothing, or other items with a person who  has the infection. RISK FACTORS Some things that can increase the risk of getting this infection include:  Playing sports that include skin-to-skin contact with others.  Having a skin condition with open sores.  Having many skin cuts or  scrapes.  Living in an area that has high humidity levels.  Having poor hygiene.  Having high levels of staphylococci in your nose. SIGNS AND SYMPTOMS Impetigo usually starts out as small blisters, often on the face. The blisters then break open and turn into tiny sores (lesions) with a yellow crust. In some cases, the blisters cause itching or burning. With scratching, irritation, or lack of treatment, these small lesions may get larger. Scratching can also cause impetigo to spread to other parts of the body. The bacteria can get under the fingernails and spread when you touch another area of your skin. Other possible symptoms include:  Larger blisters.  Pus.  Swollen lymph glands. DIAGNOSIS This condition is usually diagnosed during a physical exam. A skin sample or sample of fluid from a blister may be taken for lab tests that involve growing bacteria (culture test). This can help confirm the diagnosis or help determine the best treatment. TREATMENT Mild impetigo can be treated with prescription antibiotic cream. Oral antibiotic medicine may be used in more severe cases. Medicines for itching may also be used. HOME CARE INSTRUCTIONS  Take medicines only as directed by your health care provider.  To help prevent impetigo from spreading to other body areas:  Keep your fingernails short and clean.  Do not scratch the blisters or sores.  Cover infected areas, if necessary, to keep from scratching.  Gently wash the infected areas with antibiotic soap and water.  Soak crusted areas in warm, soapy water using antibiotic soap.  Gently rub the areas to remove crusts. Do not scrub.  Wash your hands often to avoid spreading this infection.  Stay home until you have used an antibiotic cream for 48 hours (2 days) or an oral antibiotic medicine for 24 hours (1 day). You should only return to work and activities with other people if your skin shows significant improvement. PREVENTION    To keep the infection from spreading:  Stay home until you have used an antibiotic cream for 48 hours or an oral antibiotic for 24 hours.  Wash your hands often.  Do not engage in skin-to-skin contact with other people while you have still have blisters.  Do not share towels, washcloths, or bedding with others while you have the infection. SEEK MEDICAL CARE IF:  You develop more blisters or sores despite treatment.  Other family members get sores.  Your skin sores are not improving after 48 hours of treatment.  You have a fever. SEEK IMMEDIATE MEDICAL CARE IF:  You see spreading redness or swelling of the skin around your sores.  You see red streaks coming from your sores.  You develop a sore throat.   This information is not intended to replace advice given to you by your health care provider. Make sure you discuss any questions you have with your health care provider.   Document Released: 02/15/2014 Document Reviewed: 02/15/2014 Elsevier Interactive Patient Education [employer]2016 Elsevier Inc.

## 2015-07-27 NOTE — ED Notes (Signed)
Pt states he returns for a rash on the back of his neck.  Initially was in hair, but is spreading.  States it burns and itches and sunlight sets it on fire.

## 2018-05-03 ENCOUNTER — Other Ambulatory Visit: Payer: Self-pay

## 2018-05-03 ENCOUNTER — Emergency Department (HOSPITAL_COMMUNITY)
Admission: EM | Admit: 2018-05-03 | Discharge: 2018-05-03 | Disposition: A | Discharge status: Home / Self Care | Payer: Medicaid Other | Attending: Emergency Medicine | Admitting: Emergency Medicine

## 2018-05-03 ENCOUNTER — Encounter (HOSPITAL_COMMUNITY): Payer: Self-pay | Admitting: Emergency Medicine

## 2018-05-03 DIAGNOSIS — F17210 Nicotine dependence, cigarettes, uncomplicated: Secondary | ICD-10-CM | POA: Diagnosis not present

## 2018-05-03 DIAGNOSIS — W57XXXA Bitten or stung by nonvenomous insect and other nonvenomous arthropods, initial encounter: Secondary | ICD-10-CM | POA: Insufficient documentation

## 2018-05-03 DIAGNOSIS — Y999 Unspecified external cause status: Secondary | ICD-10-CM | POA: Insufficient documentation

## 2018-05-03 DIAGNOSIS — Z79899 Other long term (current) drug therapy: Secondary | ICD-10-CM | POA: Diagnosis not present

## 2018-05-03 DIAGNOSIS — Y939 Activity, unspecified: Secondary | ICD-10-CM | POA: Insufficient documentation

## 2018-05-03 DIAGNOSIS — L03811 Cellulitis of head [any part, except face]: Secondary | ICD-10-CM | POA: Insufficient documentation

## 2018-05-03 DIAGNOSIS — Y929 Unspecified place or not applicable: Secondary | ICD-10-CM | POA: Insufficient documentation

## 2018-05-03 DIAGNOSIS — S0006XA Insect bite (nonvenomous) of scalp, initial encounter: Secondary | ICD-10-CM | POA: Diagnosis not present

## 2018-05-03 DIAGNOSIS — R220 Localized swelling, mass and lump, head: Secondary | ICD-10-CM | POA: Diagnosis present

## 2018-05-03 LAB — CBC WITH DIFFERENTIAL/PLATELET
Abs Immature Granulocytes: 0.14 10*3/uL — ABNORMAL HIGH (ref 0.00–0.07)
Basophils Absolute: 0 10*3/uL (ref 0.0–0.1)
Basophils Relative: 0 %
EOS PCT: 0 %
Eosinophils Absolute: 0 10*3/uL (ref 0.0–0.5)
HCT: 44.5 % (ref 39.0–52.0)
Hemoglobin: 15 g/dL (ref 13.0–17.0)
Immature Granulocytes: 1 %
Lymphocytes Relative: 5 %
Lymphs Abs: 0.7 10*3/uL (ref 0.7–4.0)
MCH: 32.3 pg (ref 26.0–34.0)
MCHC: 33.7 g/dL (ref 30.0–36.0)
MCV: 95.9 fL (ref 80.0–100.0)
MONOS PCT: 5 %
Monocytes Absolute: 0.7 10*3/uL (ref 0.1–1.0)
Neutro Abs: 12 10*3/uL — ABNORMAL HIGH (ref 1.7–7.7)
Neutrophils Relative %: 89 %
Platelets: 227 10*3/uL (ref 150–400)
RBC: 4.64 MIL/uL (ref 4.22–5.81)
RDW: 13.3 % (ref 11.5–15.5)
WBC: 13.6 10*3/uL — ABNORMAL HIGH (ref 4.0–10.5)
nRBC: 0 % (ref 0.0–0.2)

## 2018-05-03 LAB — BASIC METABOLIC PANEL
Anion gap: 9 (ref 5–15)
BUN: 17 mg/dL (ref 6–20)
CHLORIDE: 101 mmol/L (ref 98–111)
CO2: 23 mmol/L (ref 22–32)
Calcium: 8.8 mg/dL — ABNORMAL LOW (ref 8.9–10.3)
Creatinine, Ser: 1.06 mg/dL (ref 0.61–1.24)
GFR calc Af Amer: >60 mL/min (ref >60)
GFR calc non Af Amer: >60 mL/min (ref >60)
Glucose, Bld: 105 mg/dL — ABNORMAL HIGH (ref 70–99)
Potassium: 3.5 mmol/L (ref 3.5–5.1)
Sodium: 133 mmol/L — ABNORMAL LOW (ref 135–145)

## 2018-05-03 MED ORDER — HYDROCODONE-ACETAMINOPHEN 5-325 MG PO TABS: 1.0000 | ORAL_TABLET | ORAL | 0 refills | Status: DC | PRN

## 2018-05-03 MED ORDER — CLINDAMYCIN HCL 150 MG PO CAPS: 300.0000 mg | ORAL_CAPSULE | Freq: Three times a day (TID) | ORAL | 0 refills | Status: DC

## 2018-05-03 MED ORDER — OXYCODONE-ACETAMINOPHEN 5-325 MG PO TABS
1.0000 | ORAL_TABLET | Freq: Once | ORAL | Status: AC
  Administered 2018-05-03: 1 via ORAL
  Filled 2018-05-03: qty 1

## 2018-05-03 MED ORDER — MORPHINE SULFATE (PF) 4 MG/ML IV SOLN
4.0000 mg | Freq: Once | INTRAVENOUS | Status: AC
  Administered 2018-05-03: 4 mg via INTRAVENOUS
  Filled 2018-05-03: qty 1

## 2018-05-03 MED ORDER — CLINDAMYCIN PHOSPHATE 600 MG/50ML IV SOLN
600.0000 mg | Freq: Once | INTRAVENOUS | Status: AC
  Administered 2018-05-03: 600 mg via INTRAVENOUS
  Filled 2018-05-03: qty 50

## 2018-05-03 MED ORDER — ONDANSETRON HCL 4 MG/2ML IJ SOLN
4.0000 mg | Freq: Once | INTRAMUSCULAR | Status: AC
  Administered 2018-05-03: 4 mg via INTRAVENOUS
  Filled 2018-05-03: qty 2

## 2018-05-03 MED ORDER — SODIUM CHLORIDE 0.9 % IV BOLUS
1000.0000 mL | Freq: Once | INTRAVENOUS | Status: AC
  Administered 2018-05-03: 1000 mL via INTRAVENOUS

## 2018-05-03 NOTE — ED Triage Notes (Signed)
Patient complains of insect bites on back of neck and back of head x 1 week. Patient has raised places in back of head and neck. Patient states he has been placing neosporin on insect bites.

## 2018-05-03 NOTE — Discharge Instructions (Addendum)
Take one more dose of the antibiotic today before bedtime.  You may take the hydrocodone prescribed for pain relief.  This will make you drowsy - do not drive within 4 hours of taking this medication.  Apply warm compresses to your area of pain 20 minutes several times daily.

## 2018-05-04 NOTE — ED Provider Notes (Signed)
[facility] EMERGENCY DEPARTMENT Provider Note   CSN: [id number] Arrival date & time: 05/03/18  1116    History   Chief Complaint Chief Complaint  Patient presents with  . Insect Bite    HPI [name] is a 53 y.o. male with a history of GERD and Hepatitis C presenting with a painful suspected insect bite to his posterior scalp.  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 Initially the site was itchy and remains so, but has also become very painful. The pain radiates to his superior scalp and also affects his neck.  Pain is severe and constant. He reports generalized headache in association with generalized myalgia, nausea without emesis and subjective fever. He denies abdominal pain. He has had a poor appetite over the past several days.  He has applied neosporin and has also applied an anti itch cream to the site with no improvement in symptoms.      The history is provided by the patient.    Past Medical History:  Diagnosis Date  . Acid reflux   . Hepatitis C   . Hiatal hernia     Patient Active Problem List   Diagnosis Date Noted  . Hepatitis C antibody test positive 12/17/2013  . Elevated BP 12/17/2013  . Smoking 12/17/2013  . Family history of diabetes mellitus (DM) 12/17/2013  . Notalgia 12/17/2013  . Screening 12/17/2013  . Cellulitis 11/23/2013    Past Surgical History:  Procedure Laterality Date  . FACIAL FRACTURE SURGERY          Home Medications    Prior to Admission medications   Medication Sig Start Date End Date Taking? Authorizing Provider  clindamycin (CLEOCIN) 150 MG capsule Take 2 capsules (300 mg total) by mouth 3 (three) times daily. 05/03/18   Burgess Amor, PA-C  diphenhydrAMINE (BENADRYL) 25 mg capsule Take 25 mg by mouth every 6 (six) hours as needed.    [provider]  doxycycline (VIBRAMYCIN) 100 MG capsule Take 1 capsule (100 mg  total) by mouth 2 (two) times daily. 07/21/15   Kirichenko, Lemont Fillers, PA-C  HYDROcodone-acetaminophen (NORCO/VICODIN) 5-325 MG tablet Take 1 tablet by mouth every 4 (four) hours as needed. 05/03/18   Burgess Amor, PA-C  hydrOXYzine (ATARAX/VISTARIL) 25 MG tablet Take 1 tablet (25 mg total) by mouth every 6 (six) hours. 07/21/15   Kirichenko, Tatyana, PA-C  ibuprofen (ADVIL,MOTRIN) 200 MG tablet Take 400 mg by mouth every 6 (six) hours as needed.    [provider]  naproxen (NAPROSYN) 500 MG tablet Take 1 tablet (500 mg total) by mouth 2 (two) times daily. 07/21/15   Kirichenko, Tatyana, PA-C  ranitidine (ZANTAC) 150 MG capsule Take 150 mg by mouth 2 (two) times daily.    [provider]  selenium sulfide (SELSUN BLUE MEDICATED) 1 % LOTN Apply 1 application topically daily. 07/27/15   Ivery Quale, PA-C    Family History Family History  Problem Relation Age of Onset  . Diabetes Mother   . Hypertension Mother   . Hypertension Father     Social History Social History   Tobacco Use  . Smoking status: Current Every Day Smoker    Packs/day: 1.00    Years: 20.00    Pack years: 20.00    Types: Cigarettes  . Smokeless tobacco: Never Used  Substance Use Topics  . Alcohol use: Not Currently  . Drug  use: No     Allergies   Patient has no known allergies.   Review of Systems Review of Systems  Constitutional: Positive for fever. Negative for chills.  HENT: Negative for congestion and sore throat.   Eyes: Negative.   Respiratory: Negative for chest tightness and shortness of breath.   Cardiovascular: Negative for chest pain.  Gastrointestinal: Positive for nausea. Negative for abdominal pain and vomiting.  Genitourinary: Negative.   Musculoskeletal: Positive for neck pain. Negative for arthralgias, joint swelling and neck stiffness.  Skin: Positive for color change and wound. Negative for rash.  Neurological: Positive for headaches. Negative for dizziness, weakness,  light-headedness and numbness.  Psychiatric/Behavioral: Negative.      Physical Exam Updated Vital Signs BP 106/82 (BP Location: Left Arm)   Pulse 96   Temp 98.6 F (37 C) (Oral)   Resp 18   Ht 5\' 1"  (1.549 m)   Wt 74.8 kg   SpO2 99%   BMI 31.18 kg/m   Physical Exam Vitals signs and nursing note reviewed.  Constitutional:      Appearance: He is well-developed.     Comments: Appears uncomfortable.  HENT:     Head: Atraumatic.     Right Ear: Tympanic membrane normal.     Left Ear: Tympanic membrane normal.     Nose: Nose normal.     Mouth/Throat:     Mouth: Mucous membranes are dry.     Pharynx: Oropharynx is clear.  Eyes:     Conjunctiva/sclera: Conjunctivae normal.  Neck:     Musculoskeletal: Full passive range of motion without pain and normal range of motion. Normal range of motion. Erythema present. No muscular tenderness.     Comments: Erythema to mid posterior neck.  Erythema extends to the mid occipital scalp.  Macular scab, 0.4 cm left posterior scalp.  No induration or fluctuance. Occipital adenopathy. Cardiovascular:     Rate and Rhythm: Normal rate and regular rhythm.     Heart sounds: Normal heart sounds.  Pulmonary:     Effort: Pulmonary effort is normal.     Breath sounds: Normal breath sounds. No wheezing.  Abdominal:     General: Bowel sounds are normal.     Palpations: Abdomen is soft.     Tenderness: There is no abdominal tenderness.  Musculoskeletal: Normal range of motion.  Skin:    General: Skin is warm and dry.     Comments: Cellulitis posterior scalp and neck per above.  Neurological:     Mental Status: He is alert.      ED Treatments / Results  Labs (all labs ordered are listed, but only abnormal results are displayed) Labs Reviewed  CBC WITH DIFFERENTIAL/PLATELET - Abnormal; Notable for the following components:      Result Value   WBC 13.6 (*)    Neutro Abs 12.0 (*)    Abs Immature Granulocytes 0.14 (*)    All other components  within normal limits  BASIC METABOLIC PANEL - Abnormal; Notable for the following components:   Sodium 133 (*)    Glucose, Bld 105 (*)    Calcium 8.8 (*)    All other components within normal limits    EKG None  Radiology No results found.  Procedures Procedures (including critical care time)  Medications Ordered in ED Medications  sodium chloride 0.9 % bolus 1,000 mL (0 mLs Intravenous Stopped 05/03/18 1403)  morphine 4 MG/ML injection 4 mg (4 mg Intravenous Given 05/03/18 1317)  ondansetron (ZOFRAN) injection 4  mg (4 mg Intravenous Given 05/03/18 1315)  clindamycin (CLEOCIN) IVPB 600 mg (0 mg Intravenous Stopped 05/03/18 1445)  oxyCODONE-acetaminophen (PERCOCET/ROXICET) 5-325 MG per tablet 1 tablet (1 tablet Oral Given 05/03/18 1417)     Initial Impression / Assessment and Plan / ED Course  I have reviewed the triage vital signs and the nursing notes.  Pertinent labs & imaging results that were available during my care of the patient were reviewed by me and considered in my medical decision making (see chart for details).        Pt with insect bite with development of cellulitis posterior scalp and neck.  He does have some constitutional sx, no abdominal pain nor lab abnormalities suggesting black widow bite.  Bite is not ulcerating, no abscess but significant cellulitis. He was given IV fluids along with IV dose of clindamycin with plans for continued po clindamycin x 1 week. Advised warm compresses. Hydrocodone prescribed for pain.  He was advised to return here for recheck in 48 hours if sx not improving.   Final Clinical Impressions(s) / ED Diagnoses   Final diagnoses:  Insect bite of scalp, initial encounter  Cellulitis of head except face    ED Discharge Orders         Ordered    clindamycin (CLEOCIN) 150 MG capsule  3 times daily     05/03/18 1512    HYDROcodone-acetaminophen (NORCO/VICODIN) 5-325 MG tablet  Every 4 hours PRN     05/03/18 1512            Burgess Amor, PA-C 05/04/18 2304    Samuel Jester, DO 05/08/18 1804

## 2018-06-13 ENCOUNTER — Encounter (HOSPITAL_COMMUNITY): Payer: Self-pay | Admitting: Emergency Medicine

## 2018-06-13 ENCOUNTER — Other Ambulatory Visit: Payer: Self-pay

## 2018-06-13 ENCOUNTER — Emergency Department (HOSPITAL_COMMUNITY): Payer: Medicaid Other

## 2018-06-13 ENCOUNTER — Emergency Department (HOSPITAL_COMMUNITY)
Admission: EM | Admit: 2018-06-13 | Discharge: 2018-06-13 | Disposition: A | Discharge status: Home / Self Care | Payer: Medicaid Other | Attending: Emergency Medicine | Admitting: Emergency Medicine

## 2018-06-13 DIAGNOSIS — F17210 Nicotine dependence, cigarettes, uncomplicated: Secondary | ICD-10-CM | POA: Insufficient documentation

## 2018-06-13 DIAGNOSIS — M5431 Sciatica, right side: Secondary | ICD-10-CM | POA: Insufficient documentation

## 2018-06-13 DIAGNOSIS — M25551 Pain in right hip: Secondary | ICD-10-CM | POA: Insufficient documentation

## 2018-06-13 DIAGNOSIS — Z79899 Other long term (current) drug therapy: Secondary | ICD-10-CM | POA: Diagnosis not present

## 2018-06-13 MED ORDER — ONDANSETRON HCL 4 MG/2ML IJ SOLN
4.0000 mg | Freq: Once | INTRAMUSCULAR | Status: AC
  Administered 2018-06-13: 02:00:00 4 mg via INTRAVENOUS
  Filled 2018-06-13: qty 2

## 2018-06-13 MED ORDER — OXYCODONE-ACETAMINOPHEN 5-325 MG PO TABS
1.0000 | ORAL_TABLET | Freq: Once | ORAL | Status: AC
  Administered 2018-06-13: 07:00:00 1 via ORAL
  Filled 2018-06-13: qty 1

## 2018-06-13 MED ORDER — PREDNISONE 10 MG PO TABS: 20.0000 mg | ORAL_TABLET | Freq: Every day | ORAL | 0 refills | Status: DC

## 2018-06-13 MED ORDER — METHYLPREDNISOLONE SODIUM SUCC 125 MG IJ SOLR
125.0000 mg | Freq: Once | INTRAMUSCULAR | Status: AC
  Administered 2018-06-13: 02:00:00 125 mg via INTRAVENOUS
  Filled 2018-06-13: qty 2

## 2018-06-13 MED ORDER — KETOROLAC TROMETHAMINE 30 MG/ML IJ SOLN
30.0000 mg | Freq: Once | INTRAMUSCULAR | Status: AC
  Administered 2018-06-13: 04:00:00 30 mg via INTRAVENOUS
  Filled 2018-06-13: qty 1

## 2018-06-13 MED ORDER — HYDROMORPHONE HCL 1 MG/ML IJ SOLN
0.5000 mg | Freq: Once | INTRAMUSCULAR | Status: AC
  Administered 2018-06-13: 03:00:00 0.5 mg via INTRAVENOUS
  Filled 2018-06-13: qty 1

## 2018-06-13 MED ORDER — KETOROLAC TROMETHAMINE 30 MG/ML IJ SOLN
15.0000 mg | Freq: Once | INTRAMUSCULAR | Status: AC
  Administered 2018-06-13: 15 mg via INTRAVENOUS
  Filled 2018-06-13: qty 1

## 2018-06-13 MED ORDER — OXYCODONE-ACETAMINOPHEN 5-325 MG PO TABS: 1.0000 | ORAL_TABLET | Freq: Four times a day (QID) | ORAL | 0 refills | Status: DC | PRN

## 2018-06-13 MED ORDER — CYCLOBENZAPRINE HCL 10 MG PO TABS: 10.0000 mg | ORAL_TABLET | Freq: Three times a day (TID) | ORAL | 0 refills | Status: DC | PRN

## 2018-06-13 MED ORDER — HYDROMORPHONE HCL 1 MG/ML IJ SOLN
1.0000 mg | Freq: Once | INTRAMUSCULAR | Status: AC
  Administered 2018-06-13: 02:00:00 1 mg via INTRAVENOUS
  Filled 2018-06-13: qty 1

## 2018-06-13 MED ORDER — LORAZEPAM 2 MG/ML IJ SOLN
0.5000 mg | Freq: Once | INTRAMUSCULAR | Status: AC
  Administered 2018-06-13: 0.5 mg via INTRAVENOUS
  Filled 2018-06-13: qty 1

## 2018-06-13 MED ORDER — HYDROMORPHONE HCL 1 MG/ML IJ SOLN
0.7500 mg | Freq: Once | INTRAMUSCULAR | Status: AC
  Administered 2018-06-13: 10:00:00 0.75 mg via INTRAVENOUS
  Filled 2018-06-13: qty 1

## 2018-06-13 NOTE — Discharge Instructions (Addendum)
Rest at home 2 days.  Follow-up with your family doctor later this week.  You can also follow-up in the next couple weeks with Dr. Lovell Sheehan neurosurgery if needed

## 2018-06-13 NOTE — ED Triage Notes (Addendum)
Pt moved a lawn mower yesterday and has had R hip pain since then. Pt states he cannot walk and that his R leg is numb. Pt has tried Ibuprofen without relief.

## 2018-06-13 NOTE — ED Provider Notes (Signed)
[facility], [place] EMERGENCY DEPARTMENT Provider Note   CSN: [id number] Arrival date & time: 06/13/18  0126    History   Chief Complaint Chief Complaint  Patient presents with   Hip Pain    HPI [name] is a 53 y.o. male.     Patient was lifting a lawnmower and started having pain running down his left leg.  The history is provided by the patient. No language interpreter was used.  Hip Pain  This is a new problem. The current episode started 3 to 5 hours ago. The problem occurs constantly. The problem has not changed since onset.Pertinent negatives include no chest pain, no abdominal pain and no headaches. Exacerbated by: Movement. Nothing relieves the symptoms. He has tried nothing for the symptoms. The treatment provided no relief.    Past Medical History:  Diagnosis Date   Acid reflux    Hepatitis C    Hiatal hernia     Patient Active Problem List   Diagnosis Date Noted   Hepatitis C antibody test positive 12/17/2013   Elevated BP 12/17/2013   Smoking 12/17/2013   Family history of diabetes mellitus (DM) 12/17/2013   Notalgia 12/17/2013   Screening 12/17/2013   Cellulitis 11/23/2013    Past Surgical History:  Procedure Laterality Date   FACIAL FRACTURE SURGERY          Home Medications    Prior to Admission medications   Medication Sig Start Date End Date Taking? Authorizing Provider  ibuprofen (ADVIL,MOTRIN) 200 MG tablet Take 400 mg by mouth every 6 (six) hours as needed.   Yes [provider]  clindamycin (CLEOCIN) 150 MG capsule Take 2 capsules (300 mg total) by mouth 3 (three) times daily. 05/03/18   Burgess Amor, PA-C  cyclobenzaprine (FLEXERIL) 10 MG tablet Take 1 tablet (10 mg total) by mouth 3 (three) times daily as needed for muscle spasms. 06/13/18   Bethann Berkshire, MD  diphenhydrAMINE (BENADRYL) 25 mg capsule Take 25 mg by mouth every 6 (six) hours as needed.    [provider]  doxycycline (VIBRAMYCIN) 100 MG capsule  Take 1 capsule (100 mg total) by mouth 2 (two) times daily. 07/21/15   Kirichenko, Lemont Fillers, PA-C  HYDROcodone-acetaminophen (NORCO/VICODIN) 5-325 MG tablet Take 1 tablet by mouth every 4 (four) hours as needed. 05/03/18   Burgess Amor, PA-C  hydrOXYzine (ATARAX/VISTARIL) 25 MG tablet Take 1 tablet (25 mg total) by mouth every 6 (six) hours. 07/21/15   Kirichenko, Tatyana, PA-C  naproxen (NAPROSYN) 500 MG tablet Take 1 tablet (500 mg total) by mouth 2 (two) times daily. 07/21/15   Kirichenko, Lemont Fillers, PA-C  oxyCODONE-acetaminophen (PERCOCET/ROXICET) 5-325 MG tablet Take 1 tablet by mouth every 6 (six) hours as needed. 06/13/18   Bethann Berkshire, MD  predniSONE (DELTASONE) 10 MG tablet Take 2 tablets (20 mg total) by mouth daily. 06/13/18   Bethann Berkshire, MD  ranitidine (ZANTAC) 150 MG capsule Take 150 mg by mouth 2 (two) times daily.    [provider]  selenium sulfide (SELSUN BLUE MEDICATED) 1 % LOTN Apply 1 application topically daily. 07/27/15   Ivery Quale, PA-C    Family History Family History  Problem Relation Age of Onset   Diabetes Mother    Hypertension Mother    Hypertension Father     Social History Social History   Tobacco Use   Smoking status: Current Every Day Smoker    Packs/day: 1.00    Years: 20.00    Pack years: 20.00  Types: Cigarettes   Smokeless tobacco: Never Used  Substance Use Topics   Alcohol use: Not Currently   Drug use: No     Allergies   Patient has no known allergies.   Review of Systems Review of Systems  Constitutional: Negative for appetite change and fatigue.  HENT: Negative for congestion, ear discharge and sinus pressure.   Eyes: Negative for discharge.  Respiratory: Negative for cough.   Cardiovascular: Negative for chest pain.  Gastrointestinal: Negative for abdominal pain and diarrhea.  Genitourinary: Negative for frequency and hematuria.  Musculoskeletal: Negative for back pain.       Pain in right hip and right leg    Skin: Negative for rash.  Neurological: Negative for seizures and headaches.  Psychiatric/Behavioral: Negative for hallucinations.     Physical Exam Updated Vital Signs BP (!) 146/94    Pulse 96    Temp 98.2 F (36.8 C) (Oral)    Resp 16    Ht  (1.778 m)    Wt 74.8 kg    SpO2 98%    BMI 23.68 kg/m   Physical Exam Vitals signs and nursing note reviewed.  Constitutional:      Appearance: He is well-developed.  HENT:     Head: Normocephalic.     Nose: Nose normal.  Eyes:     General: No scleral icterus.    Conjunctiva/sclera: Conjunctivae normal.  Neck:     Musculoskeletal: Neck supple.     Thyroid: No thyromegaly.  Cardiovascular:     Rate and Rhythm: Normal rate and regular rhythm.     Heart sounds: No murmur. No friction rub. No gallop.   Pulmonary:     Breath sounds: No stridor. No wheezing or rales.  Chest:     Chest wall: No tenderness.  Abdominal:     General: There is no distension.     Tenderness: There is no abdominal tenderness. There is no rebound.  Musculoskeletal:     Comments: Patient has positive straight leg raise on the right.  Lymphadenopathy:     Cervical: No cervical adenopathy.  Skin:    General: Skin is warm.     Findings: No erythema or rash.  Neurological:     Mental Status: He is oriented to person, place, and time.     Motor: No abnormal muscle tone.     Coordination: Coordination normal.  Psychiatric:        Behavior: Behavior normal.      ED Treatments / Results  Labs (all labs ordered are listed, but only abnormal results are displayed) Labs Reviewed - No data to display  EKG None  Radiology Dg Hip Unilat W Or Wo Pelvis 2-3 Views Right  Result Date: 06/13/2018 CLINICAL DATA:  53 y/o M; picked up a lung nor and felt severe pain in the right hip. EXAM: DG HIP (WITH OR WITHOUT PELVIS) 2-3V RIGHT COMPARISON:  None. FINDINGS: There is no evidence of hip fracture or dislocation. There is no evidence of arthropathy or other  focal bone abnormality. IMPRESSION: Negative. Electronically Signed   By: Mitzi Hansen M.D.   On: 06/13/2018 02:00    Procedures Procedures (including critical care time)  Medications Ordered in ED Medications  oxyCODONE-acetaminophen (PERCOCET/ROXICET) 5-325 MG per tablet 1 tablet (has no administration in time range)  HYDROmorphone (DILAUDID) injection 1 mg (1 mg Intravenous Given 06/13/18 0215)  ondansetron (ZOFRAN) injection 4 mg (4 mg Intravenous Given 06/13/18 0215)  methylPREDNISolone sodium succinate (SOLU-MEDROL) 125 mg/2 mL  injection 125 mg (125 mg Intravenous Given 06/13/18 0215)  LORazepam (ATIVAN) injection 0.5 mg (0.5 mg Intravenous Given 06/13/18 0300)  HYDROmorphone (DILAUDID) injection 0.5 mg (0.5 mg Intravenous Given 06/13/18 0301)  ketorolac (TORADOL) 30 MG/ML injection 30 mg (30 mg Intravenous Given 06/13/18 0407)     Initial Impression / Assessment and Plan / ED Course  I have reviewed the triage vital signs and the nursing notes.  Pertinent labs & imaging results that were available during my care of the patient were reviewed by me and considered in my medical decision making (see chart for details).        Patient with right-sided sciatica.  Patient improved some with pain medicine and steroids.  He will be sent home with pain medicine muscle relaxer steroids and rest.  He will follow-up with primary care doctor later this week and has been referred to neurosurgery Final Clinical Impressions(s) / ED Diagnoses   Final diagnoses:  Right hip pain    ED Discharge Orders         Ordered    oxyCODONE-acetaminophen (PERCOCET/ROXICET) 5-325 MG tablet  Every 6 hours PRN     06/13/18 0558    cyclobenzaprine (FLEXERIL) 10 MG tablet  3 times daily PRN     06/13/18 0558    predniSONE (DELTASONE) 10 MG tablet  Daily     06/13/18 0558           Bethann BerkshireZammit, Silva Aamodt, MD 06/13/18 0601

## 2018-06-19 ENCOUNTER — Encounter (HOSPITAL_COMMUNITY): Payer: Self-pay

## 2018-06-19 ENCOUNTER — Inpatient Hospital Stay (HOSPITAL_COMMUNITY)
Admission: EM | Admit: 2018-06-19 | Discharge: 2018-08-10 | DRG: 853 | Disposition: A | Discharge status: Home Health | Payer: Medicaid Other | Attending: Internal Medicine | Admitting: Internal Medicine

## 2018-06-19 ENCOUNTER — Observation Stay (HOSPITAL_COMMUNITY): Payer: Medicaid Other

## 2018-06-19 ENCOUNTER — Other Ambulatory Visit: Payer: Self-pay

## 2018-06-19 ENCOUNTER — Emergency Department (HOSPITAL_COMMUNITY): Payer: Medicaid Other

## 2018-06-19 DIAGNOSIS — L03113 Cellulitis of right upper limb: Secondary | ICD-10-CM | POA: Diagnosis present

## 2018-06-19 DIAGNOSIS — F17210 Nicotine dependence, cigarettes, uncomplicated: Secondary | ICD-10-CM | POA: Diagnosis not present

## 2018-06-19 DIAGNOSIS — Z20828 Contact with and (suspected) exposure to other viral communicable diseases: Secondary | ICD-10-CM | POA: Diagnosis not present

## 2018-06-19 DIAGNOSIS — M549 Dorsalgia, unspecified: Secondary | ICD-10-CM

## 2018-06-19 DIAGNOSIS — M23204 Derangement of unspecified medial meniscus due to old tear or injury, left knee: Secondary | ICD-10-CM | POA: Diagnosis present

## 2018-06-19 DIAGNOSIS — J189 Pneumonia, unspecified organism: Secondary | ICD-10-CM | POA: Diagnosis not present

## 2018-06-19 DIAGNOSIS — R000 Tachycardia, unspecified: Secondary | ICD-10-CM

## 2018-06-19 DIAGNOSIS — M4807 Spinal stenosis, lumbosacral region: Secondary | ICD-10-CM

## 2018-06-19 DIAGNOSIS — K6812 Psoas muscle abscess: Secondary | ICD-10-CM

## 2018-06-19 DIAGNOSIS — M7989 Other specified soft tissue disorders: Secondary | ICD-10-CM

## 2018-06-19 DIAGNOSIS — D649 Anemia, unspecified: Secondary | ICD-10-CM | POA: Diagnosis present

## 2018-06-19 DIAGNOSIS — K219 Gastro-esophageal reflux disease without esophagitis: Secondary | ICD-10-CM | POA: Diagnosis present

## 2018-06-19 DIAGNOSIS — M4649 Discitis, unspecified, multiple sites in spine: Secondary | ICD-10-CM | POA: Diagnosis present

## 2018-06-19 DIAGNOSIS — B1920 Unspecified viral hepatitis C without hepatic coma: Secondary | ICD-10-CM | POA: Diagnosis present

## 2018-06-19 DIAGNOSIS — G9341 Metabolic encephalopathy: Secondary | ICD-10-CM | POA: Diagnosis present

## 2018-06-19 DIAGNOSIS — A419 Sepsis, unspecified organism: Secondary | ICD-10-CM | POA: Diagnosis not present

## 2018-06-19 DIAGNOSIS — E871 Hypo-osmolality and hyponatremia: Secondary | ICD-10-CM | POA: Diagnosis present

## 2018-06-19 DIAGNOSIS — I82711 Chronic embolism and thrombosis of superficial veins of right upper extremity: Secondary | ICD-10-CM | POA: Diagnosis not present

## 2018-06-19 DIAGNOSIS — D638 Anemia in other chronic diseases classified elsewhere: Secondary | ICD-10-CM | POA: Diagnosis not present

## 2018-06-19 DIAGNOSIS — M5430 Sciatica, unspecified side: Secondary | ICD-10-CM

## 2018-06-19 DIAGNOSIS — M4658 Other infective spondylopathies, sacral and sacrococcygeal region: Secondary | ICD-10-CM | POA: Diagnosis not present

## 2018-06-19 DIAGNOSIS — E869 Volume depletion, unspecified: Secondary | ICD-10-CM | POA: Diagnosis not present

## 2018-06-19 DIAGNOSIS — D509 Iron deficiency anemia, unspecified: Secondary | ICD-10-CM | POA: Diagnosis not present

## 2018-06-19 DIAGNOSIS — M25422 Effusion, left elbow: Secondary | ICD-10-CM | POA: Diagnosis present

## 2018-06-19 DIAGNOSIS — L02413 Cutaneous abscess of right upper limb: Secondary | ICD-10-CM | POA: Diagnosis not present

## 2018-06-19 DIAGNOSIS — L03114 Cellulitis of left upper limb: Secondary | ICD-10-CM | POA: Diagnosis present

## 2018-06-19 DIAGNOSIS — L0293 Carbuncle, unspecified: Secondary | ICD-10-CM | POA: Diagnosis present

## 2018-06-19 DIAGNOSIS — F1010 Alcohol abuse, uncomplicated: Secondary | ICD-10-CM | POA: Diagnosis present

## 2018-06-19 DIAGNOSIS — Z452 Encounter for adjustment and management of vascular access device: Secondary | ICD-10-CM

## 2018-06-19 DIAGNOSIS — E876 Hypokalemia: Secondary | ICD-10-CM | POA: Diagnosis not present

## 2018-06-19 DIAGNOSIS — M79601 Pain in right arm: Secondary | ICD-10-CM

## 2018-06-19 DIAGNOSIS — N179 Acute kidney failure, unspecified: Secondary | ICD-10-CM | POA: Diagnosis not present

## 2018-06-19 DIAGNOSIS — M4647 Discitis, unspecified, lumbosacral region: Secondary | ICD-10-CM

## 2018-06-19 DIAGNOSIS — F1410 Cocaine abuse, uncomplicated: Secondary | ICD-10-CM | POA: Diagnosis present

## 2018-06-19 DIAGNOSIS — E441 Mild protein-calorie malnutrition: Secondary | ICD-10-CM | POA: Diagnosis present

## 2018-06-19 DIAGNOSIS — K5900 Constipation, unspecified: Secondary | ICD-10-CM | POA: Diagnosis not present

## 2018-06-19 DIAGNOSIS — L0390 Cellulitis, unspecified: Secondary | ICD-10-CM

## 2018-06-19 DIAGNOSIS — I10 Essential (primary) hypertension: Secondary | ICD-10-CM | POA: Diagnosis not present

## 2018-06-19 DIAGNOSIS — Z8249 Family history of ischemic heart disease and other diseases of the circulatory system: Secondary | ICD-10-CM

## 2018-06-19 DIAGNOSIS — B182 Chronic viral hepatitis C: Secondary | ICD-10-CM | POA: Diagnosis present

## 2018-06-19 DIAGNOSIS — R52 Pain, unspecified: Secondary | ICD-10-CM

## 2018-06-19 DIAGNOSIS — M009 Pyogenic arthritis, unspecified: Secondary | ICD-10-CM

## 2018-06-19 DIAGNOSIS — M48061 Spinal stenosis, lumbar region without neurogenic claudication: Secondary | ICD-10-CM | POA: Diagnosis present

## 2018-06-19 DIAGNOSIS — R636 Underweight: Secondary | ICD-10-CM | POA: Diagnosis present

## 2018-06-19 DIAGNOSIS — L0292 Furuncle, unspecified: Secondary | ICD-10-CM | POA: Diagnosis present

## 2018-06-19 DIAGNOSIS — F1910 Other psychoactive substance abuse, uncomplicated: Secondary | ICD-10-CM | POA: Diagnosis present

## 2018-06-19 DIAGNOSIS — Z6823 Body mass index (BMI) 23.0-23.9, adult: Secondary | ICD-10-CM

## 2018-06-19 DIAGNOSIS — M25462 Effusion, left knee: Secondary | ICD-10-CM

## 2018-06-19 DIAGNOSIS — S83242A Other tear of medial meniscus, current injury, left knee, initial encounter: Secondary | ICD-10-CM | POA: Diagnosis present

## 2018-06-19 DIAGNOSIS — Z833 Family history of diabetes mellitus: Secondary | ICD-10-CM

## 2018-06-19 HISTORY — DX: Unspecified injury of head, initial encounter: S09.90XA

## 2018-06-19 LAB — URINALYSIS, ROUTINE W REFLEX MICROSCOPIC
Bilirubin Urine: NEGATIVE
Glucose, UA: 50 mg/dL — AB
Ketones, ur: NEGATIVE mg/dL
Leukocytes,Ua: NEGATIVE
Nitrite: NEGATIVE
Protein, ur: NEGATIVE mg/dL
Specific Gravity, Urine: 1.014 (ref 1.005–1.030)
pH: 5 (ref 5.0–8.0)

## 2018-06-19 LAB — OSMOLALITY: Osmolality: 302 mOsm/kg — ABNORMAL HIGH (ref 275–295)

## 2018-06-19 LAB — BASIC METABOLIC PANEL
Anion gap: 15 (ref 5–15)
BUN: 120 mg/dL — ABNORMAL HIGH (ref 6–20)
CO2: 22 mmol/L (ref 22–32)
Calcium: 7.4 mg/dL — ABNORMAL LOW (ref 8.9–10.3)
Chloride: 85 mmol/L — ABNORMAL LOW (ref 98–111)
Creatinine, Ser: 3.56 mg/dL — ABNORMAL HIGH (ref 0.61–1.24)
GFR calc Af Amer: 21 mL/min — ABNORMAL LOW (ref >60)
GFR calc non Af Amer: 18 mL/min — ABNORMAL LOW (ref >60)
Glucose, Bld: 153 mg/dL — ABNORMAL HIGH (ref 70–99)
Potassium: 3.3 mmol/L — ABNORMAL LOW (ref 3.5–5.1)
Sodium: 122 mmol/L — ABNORMAL LOW (ref 135–145)

## 2018-06-19 LAB — CBC
HCT: 34.5 % — ABNORMAL LOW (ref 39.0–52.0)
Hemoglobin: 12.6 g/dL — ABNORMAL LOW (ref 13.0–17.0)
MCH: 31.6 pg (ref 26.0–34.0)
MCHC: 36.5 g/dL — ABNORMAL HIGH (ref 30.0–36.0)
MCV: 86.5 fL (ref 80.0–100.0)
Platelets: 299 10*3/uL (ref 150–400)
RBC: 3.99 MIL/uL — ABNORMAL LOW (ref 4.22–5.81)
RDW: 14.1 % (ref 11.5–15.5)
WBC: 18.9 10*3/uL — ABNORMAL HIGH (ref 4.0–10.5)
nRBC: 0.2 % (ref 0.0–0.2)

## 2018-06-19 LAB — RAPID URINE DRUG SCREEN, HOSP PERFORMED
Amphetamines: NOT DETECTED
Barbiturates: NOT DETECTED
Benzodiazepines: POSITIVE — AB
Cocaine: POSITIVE — AB
Opiates: NOT DETECTED
Tetrahydrocannabinol: NOT DETECTED

## 2018-06-19 LAB — OSMOLALITY, URINE: Osmolality, Ur: 450 mOsm/kg (ref 300–900)

## 2018-06-19 LAB — SODIUM, URINE, RANDOM: Sodium, Ur: <10 mmol/L

## 2018-06-19 LAB — MAGNESIUM: Magnesium: 2.8 mg/dL — ABNORMAL HIGH (ref 1.7–2.4)

## 2018-06-19 LAB — SARS CORONAVIRUS 2 BY RT PCR (HOSPITAL ORDER, PERFORMED IN ~~LOC~~ HOSPITAL LAB): SARS Coronavirus 2: NEGATIVE

## 2018-06-19 LAB — ETHANOL: Alcohol, Ethyl (B): <10 mg/dL (ref <10)

## 2018-06-19 LAB — TSH: TSH: 0.959 u[IU]/mL (ref 0.350–4.500)

## 2018-06-19 MED ORDER — LORAZEPAM 2 MG/ML IJ SOLN
1.0000 mg | Freq: Once | INTRAMUSCULAR | Status: AC
  Administered 2018-06-19: 15:00:00 1 mg via INTRAVENOUS
  Filled 2018-06-19: qty 1

## 2018-06-19 MED ORDER — ACETAMINOPHEN 325 MG PO TABS
650.0000 mg | ORAL_TABLET | Freq: Four times a day (QID) | ORAL | Status: DC | PRN
  Administered 2018-07-03 – 2018-08-04 (×3): 650 mg via ORAL
  Filled 2018-06-19 (×5): qty 2

## 2018-06-19 MED ORDER — METHYLPREDNISOLONE SODIUM SUCC 125 MG IJ SOLR
60.0000 mg | Freq: Once | INTRAMUSCULAR | Status: AC
  Administered 2018-06-20: 60 mg via INTRAVENOUS
  Filled 2018-06-19: qty 2

## 2018-06-19 MED ORDER — SODIUM CHLORIDE 0.9 % IV BOLUS
1000.0000 mL | Freq: Once | INTRAVENOUS | Status: AC
  Administered 2018-06-19: 16:00:00 1000 mL via INTRAVENOUS

## 2018-06-19 MED ORDER — POTASSIUM CHLORIDE CRYS ER 20 MEQ PO TBCR
40.0000 meq | EXTENDED_RELEASE_TABLET | ORAL | Status: AC
  Administered 2018-06-19: 40 meq via ORAL
  Filled 2018-06-19: qty 2

## 2018-06-19 MED ORDER — ONDANSETRON HCL 4 MG PO TABS: 4.0000 mg | ORAL_TABLET | Freq: Four times a day (QID) | ORAL | Status: DC | PRN

## 2018-06-19 MED ORDER — ACETAMINOPHEN 650 MG RE SUPP
650.0000 mg | Freq: Four times a day (QID) | RECTAL | Status: DC | PRN
  Filled 2018-06-19: qty 1

## 2018-06-19 MED ORDER — SODIUM CHLORIDE 0.9 % IV SOLN
INTRAVENOUS | Status: DC
  Administered 2018-06-19 – 2018-06-21 (×3): via INTRAVENOUS

## 2018-06-19 MED ORDER — HYDROMORPHONE HCL 1 MG/ML IJ SOLN: 1.0000 mg | Freq: Once | INTRAMUSCULAR | Status: DC

## 2018-06-19 MED ORDER — THIAMINE HCL 100 MG/ML IJ SOLN
100.0000 mg | Freq: Every day | INTRAMUSCULAR | Status: DC
  Administered 2018-06-28: 10:00:00 100 mg via INTRAVENOUS
  Filled 2018-06-19 (×4): qty 2

## 2018-06-19 MED ORDER — ADULT MULTIVITAMIN W/MINERALS CH
1.0000 | ORAL_TABLET | Freq: Every day | ORAL | Status: DC
  Administered 2018-06-19 – 2018-08-10 (×51): 1 via ORAL
  Filled 2018-06-19 (×52): qty 1

## 2018-06-19 MED ORDER — MORPHINE SULFATE (PF) 2 MG/ML IV SOLN
2.0000 mg | INTRAVENOUS | Status: DC | PRN
  Administered 2018-06-19 – 2018-06-23 (×10): 2 mg via INTRAVENOUS
  Filled 2018-06-19 (×11): qty 1

## 2018-06-19 MED ORDER — METHYLPREDNISOLONE SODIUM SUCC 125 MG IJ SOLR
125.0000 mg | Freq: Once | INTRAMUSCULAR | Status: AC
  Administered 2018-06-19: 13:00:00 125 mg via INTRAVENOUS
  Filled 2018-06-19: qty 2

## 2018-06-19 MED ORDER — SODIUM CHLORIDE 0.9 % IV BOLUS
1000.0000 mL | Freq: Once | INTRAVENOUS | Status: AC
  Administered 2018-06-19: 14:00:00 1000 mL via INTRAVENOUS

## 2018-06-19 MED ORDER — POTASSIUM CHLORIDE 20 MEQ PO PACK
PACK | ORAL | Status: AC
  Administered 2018-06-19: 16:00:00 40 meq
  Filled 2018-06-19: qty 2

## 2018-06-19 MED ORDER — HYDROMORPHONE HCL 1 MG/ML IJ SOLN
1.0000 mg | Freq: Once | INTRAMUSCULAR | Status: AC
  Administered 2018-06-19: 13:00:00 1 mg via INTRAVENOUS
  Filled 2018-06-19: qty 1

## 2018-06-19 MED ORDER — FOLIC ACID 1 MG PO TABS
1.0000 mg | ORAL_TABLET | Freq: Every day | ORAL | Status: DC
  Administered 2018-06-19 – 2018-08-10 (×53): 1 mg via ORAL
  Filled 2018-06-19 (×53): qty 1

## 2018-06-19 MED ORDER — ONDANSETRON HCL 4 MG/2ML IJ SOLN
4.0000 mg | Freq: Four times a day (QID) | INTRAMUSCULAR | Status: DC | PRN
  Administered 2018-06-23: 4 mg via INTRAVENOUS

## 2018-06-19 MED ORDER — LORAZEPAM 1 MG PO TABS
1.0000 mg | ORAL_TABLET | Freq: Four times a day (QID) | ORAL | Status: AC | PRN
  Administered 2018-06-22 (×2): 1 mg via ORAL
  Filled 2018-06-19 (×2): qty 1

## 2018-06-19 MED ORDER — VITAMIN B-1 100 MG PO TABS
100.0000 mg | ORAL_TABLET | Freq: Every day | ORAL | Status: DC
  Administered 2018-06-19 – 2018-08-10 (×52): 100 mg via ORAL
  Filled 2018-06-19 (×53): qty 1

## 2018-06-19 MED ORDER — LORAZEPAM 2 MG/ML IJ SOLN: 1.0000 mg | Freq: Four times a day (QID) | INTRAMUSCULAR | Status: AC | PRN

## 2018-06-19 MED ORDER — POLYETHYLENE GLYCOL 3350 17 G PO PACK: 17.0000 g | PACK | Freq: Every day | ORAL | Status: DC | PRN

## 2018-06-19 NOTE — ED Triage Notes (Signed)
Pt states he has been seen here a couple of times for back pain. Pt complaining today with generalized pain all over his body since his back injury 3 weeks ago.

## 2018-06-19 NOTE — ED Notes (Signed)
Reported to [facility] on pt.

## 2018-06-19 NOTE — Progress Notes (Signed)
Pt arrived to 3W02. Rates pain a 7 out of 10. Complaining of "pain all over". A&Ox4. POC given to patient. Nurse will continue to monitor.

## 2018-06-19 NOTE — ED Notes (Signed)
Pt's daughter [name] called stated that pt has hx of street drug use such as crack/cocaine.  Pt admits to last use 2-3 weeks ago.

## 2018-06-19 NOTE — Consult Note (Signed)
Chief Complaint   Chief Complaint  Patient presents with   Generalized pain    HPI   Consult requested by: Dr Lynelle Doctor Reason for consult: intractable back pain  HPI: [name] is a 53 y.o. male who presented to [facility] ER earlier today due to wide spread pain. Reportedly his primary area of pain was right sided low back pain with L>R leg pain. He had undergone MRI several days prior after injuring his back while lifting a ride on lawn mower which revealed anterolisthesis of L5 on S1 due to bilateral pars defects with subsequent foraminal disease. NSY consultation requested due to findings. Patient's pain was unable to be controlled in the ED so we requested admission for pain control with formal consultation.   I evaluated the patient shortly after his arrival to [facility] from [facility]. He was given ativan and dilaudid while in the ED causing drowsiness. He was also found to have a Cr 3.56 (baseline 0.8-1), BUN 120, Sodium 122, WBC 18.9.  He currently complains of diffuse wide spread pain including bilateral shoulders, right forearm, right wrist, left elbow, right lower back and bilateral legs. Current symptoms started 3 days ago. He does endorse a LBP injury on 06/13/2018 after attempting to lift a ride on lawn mower. Primary pain then was right lower back with RLE pain. Went to ED and given pain meds which he reports resolved his pain and he returned to work on 06/14/2018 without difficulties. His current pain is far different as it involves more areas. He does continue to have right sided lower back pain. Does endorse BLE pain. Unable active HF and quads without significant LBP. Denies bowel/bladder dysfunction.    Endorses 40+ pack year history. History of polysubstance abuse, but denies use in several years, although in ED reported several weeks. Denies any other problems with health. Not on blood thinning agents.     Patient Active Problem List   Diagnosis Date Noted   Intractable  back pain 06/19/2018   Hepatitis C antibody test positive 12/17/2013   Elevated BP 12/17/2013   Smoking 12/17/2013   Family history of diabetes mellitus (DM) 12/17/2013   Notalgia 12/17/2013   Screening 12/17/2013   Cellulitis 11/23/2013    PMH: Past Medical History:  Diagnosis Date   Acid reflux    Head injury    Hepatitis C    Hiatal hernia     PSH: Past Surgical History:  Procedure Laterality Date   FACIAL FRACTURE SURGERY      Medications Prior to Admission  Medication Sig Dispense Refill Last Dose   cyclobenzaprine (FLEXERIL) 10 MG tablet Take 1 tablet (10 mg total) by mouth 3 (three) times daily as needed for muscle spasms. (Patient not taking: Reported on 06/19/2018) 20 tablet 0 Completed Course at Unknown time   HYDROcodone-acetaminophen (NORCO/VICODIN) 5-325 MG tablet Take 1 tablet by mouth every 4 (four) hours as needed. (Patient not taking: Reported on 06/13/2018) 10 tablet 0 Completed Course at Unknown time   oxyCODONE-acetaminophen (PERCOCET/ROXICET) 5-325 MG tablet Take 1 tablet by mouth every 6 (six) hours as needed. (Patient not taking: Reported on 06/19/2018) 20 tablet 0 Completed Course at Unknown time   predniSONE (DELTASONE) 10 MG tablet Take 2 tablets (20 mg total) by mouth daily. (Patient not taking: Reported on 06/19/2018) 14 tablet 0 Completed Course at Unknown time    SH: Social History   Tobacco Use   Smoking status: Current Every Day Smoker    Packs/day: 1.00  Years: 20.00    Pack years: 20.00    Types: Cigarettes   Smokeless tobacco: Never Used  Substance Use Topics   Alcohol use: Not Currently   Drug use: Yes    Types: Cocaine    Comment: admits to last use 2-3 weeks ago    MEDS: Prior to Admission medications   Medication Sig Start Date End Date Taking? Authorizing Provider  cyclobenzaprine (FLEXERIL) 10 MG tablet Take 1 tablet (10 mg total) by mouth 3 (three) times daily as needed for muscle spasms. Patient not  taking: Reported on 06/19/2018 06/13/18   Bethann Berkshire, MD  HYDROcodone-acetaminophen (NORCO/VICODIN) 5-325 MG tablet Take 1 tablet by mouth every 4 (four) hours as needed. Patient not taking: Reported on 06/13/2018 05/03/18   Burgess Amor, PA-C  oxyCODONE-acetaminophen (PERCOCET/ROXICET) 5-325 MG tablet Take 1 tablet by mouth every 6 (six) hours as needed. Patient not taking: Reported on 06/19/2018 06/13/18   Bethann Berkshire, MD  predniSONE (DELTASONE) 10 MG tablet Take 2 tablets (20 mg total) by mouth daily. Patient not taking: Reported on 06/19/2018 06/13/18   Bethann Berkshire, MD    ALLERGY: No Known Allergies  Social History   Tobacco Use   Smoking status: Current Every Day Smoker    Packs/day: 1.00    Years: 20.00    Pack years: 20.00    Types: Cigarettes   Smokeless tobacco: Never Used  Substance Use Topics   Alcohol use: Not Currently     Family History  Problem Relation Age of Onset   Diabetes Mother    Hypertension Mother    Hypertension Father      ROS   Review of Systems  Constitutional: Positive for chills and malaise/fatigue. Negative for fever.  HENT: Negative.   Eyes: Negative for blurred vision, double vision and photophobia.  Respiratory: Negative.   Cardiovascular: Negative.   Gastrointestinal: Negative.  Negative for nausea and vomiting.  Genitourinary: Negative.   Musculoskeletal: Positive for back pain, joint pain and myalgias. Negative for falls and neck pain.  Neurological: Positive for weakness. Negative for dizziness, tingling, tremors, sensory change, speech change, focal weakness, seizures, loss of consciousness and headaches.    Exam   Vitals:   06/19/18 1745 06/19/18 1843  BP:  (!) 126/94  Pulse: (!) 101 (!) 104  Resp: 16 18  Temp:  98.4 F (36.9 C)  SpO2: 95% 95%   General appearance: WDWN, NAD, drowsy but easily awakened, does drift off to sleep after several minutes of discussion. Able to be awakened and reengaged Eyes: No scleral  injection Cardiovascular: Regular rate and rhythm without murmurs, rubs, gallops. No edema or variciosities. Distal pulses normal. Pulmonary: Effort normal, non-labored breathing Musculoskeletal:     Muscle tone upper extremities: Normal    Muscle tone lower extremities: Normal    Motor exam: Upper Extremities Deltoid Bicep Tricep Grip  Right Refuses to move due to pain 5/5 5/5 5/5  Left Refuses to move due to pain 5/5 5/5 5/5   Lower Extremity IP Quad PF DF EHL  Right 3/5 3/5 5/5 5/5 5/5  Left 3/5 3/5 5/5 5/5 5/5   Neurological Mental Status:    - Patient is awake, alert, oriented to person, place, year    - No signs of aphasia or neglect Cranial Nerves    - II: Visual Fields are full. PERRL    - III/IV/VI: EOMI without ptosis or diploplia.     - V: Facial sensation is grossly normal    - VII:  Facial movement is symmetric.     - VIII: hearing is intact to voice    - X: Uvula elevates symmetrically    - XI: Shoulder shrug is symmetric.    - XII: tongue is midline without atrophy or fasciculations.  Sensory: Sensation grossly intact to LT Deep Tendon Reflexes    - 1+ and symmetric in the biceps and patellae.  No clonus Negative hoffmans  Results - Imaging/Labs   Results for orders placed or performed during the hospital encounter of 06/19/18 (from the past 48 hour(s))  SARS Coronavirus 2 [facility] order, Performed in [facility] hospital lab)     Status: None   Collection Time: 06/19/18  2:12 PM  Result Value Ref Range   SARS Coronavirus 2 NEGATIVE NEGATIVE    Comment: (NOTE) If result is NEGATIVE SARS-CoV-2 target nucleic acids are NOT DETECTED. The SARS-CoV-2 RNA is generally detectable in upper and lower  respiratory specimens during the acute phase of infection. The lowest  concentration of SARS-CoV-2 viral copies this assay can detect is 250  copies / mL. A negative result does not preclude SARS-CoV-2 infection  and should not be used as the sole basis for  treatment or other  patient management decisions.  A negative result may occur with  improper specimen collection / handling, submission of specimen other  than nasopharyngeal swab, presence of viral mutation(s) within the  areas targeted by this assay, and inadequate number of viral copies  (<250 copies / mL). A negative result must be combined with clinical  observations, patient history, and epidemiological information. If result is POSITIVE SARS-CoV-2 target nucleic acids are DETECTED. The SARS-CoV-2 RNA is generally detectable in upper and lower  respiratory specimens dur ing the acute phase of infection.  Positive  results are indicative of active infection with SARS-CoV-2.  Clinical  correlation with patient history and other diagnostic information is  necessary to determine patient infection status.  Positive results do  not rule out bacterial infection or co-infection with other viruses. If result is PRESUMPTIVE POSTIVE SARS-CoV-2 nucleic acids MAY BE PRESENT.   A presumptive positive result was obtained on the submitted specimen  and confirmed on repeat testing.  While 2019 novel coronavirus  (SARS-CoV-2) nucleic acids may be present in the submitted sample  additional confirmatory testing may be necessary for epidemiological  and / or clinical management purposes  to differentiate between  SARS-CoV-2 and other Sarbecovirus currently known to infect humans.  If clinically indicated additional testing with an alternate test  methodology [phone]) is advised. The SARS-CoV-2 RNA is generally  detectable in upper and lower respiratory sp ecimens during the acute  phase of infection. The expected result is Negative. Fact Sheet for Patients:  BoilerBrush.com.cy Fact Sheet for Healthcare Providers: https://pope.com/ This test is not yet approved or cleared by the [place] FDA and has been authorized for detection and/or  diagnosis of SARS-CoV-2 by FDA under an Emergency Use Authorization (EUA).  This EUA will remain in effect (meaning this test can be used) for the duration of the COVID-19 declaration under Section 564(b)(1) of the Act, 21 U.S.C. section 360bbb-3(b)(1), unless the authorization is terminated or revoked sooner. Performed at [facility], [address], [place], [place] [address]   CBC     Status: Abnormal   Collection Time: 06/19/18  2:47 PM  Result Value Ref Range   WBC 18.9 (H) 4.0 - 10.5 K/uL   RBC 3.99 (L) 4.22 - 5.81 MIL/uL   Hemoglobin 12.6 (L)  13.0 - 17.0 g/dL   HCT 54.0 (L) 08.6 - 76.1 %   MCV 86.5 80.0 - 100.0 fL   MCH 31.6 26.0 - 34.0 pg   MCHC 36.5 (H) 30.0 - 36.0 g/dL   RDW 95.0 93.2 - 67.1 %   Platelets 299 150 - 400 K/uL   nRBC 0.2 0.0 - 0.2 %    Comment: Performed at [facility], [address], [place], [place] [address]  Basic metabolic panel     Status: Abnormal   Collection Time: 06/19/18  2:47 PM  Result Value Ref Range   Sodium 122 (L) 135 - 145 mmol/L   Potassium 3.3 (L) 3.5 - 5.1 mmol/L   Chloride 85 (L) 98 - 111 mmol/L   CO2 22 22 - 32 mmol/L   Glucose, Bld 153 (H) 70 - 99 mg/dL   BUN 998 (H) 6 - 20 mg/dL    Comment: RESULTS CONFIRMED BY MANUAL DILUTION   Creatinine, Ser 3.56 (H) 0.61 - 1.24 mg/dL   Calcium 7.4 (L) 8.9 - 10.3 mg/dL   GFR calc non Af Amer 18 (L) >60 mL/min   GFR calc Af Amer 21 (L) >60 mL/min   Anion gap 15 5 - 15    Comment: Performed at [facility], [address], [place], [place] [address]  Ethanol     Status: None   Collection Time: 06/19/18  2:47 PM  Result Value Ref Range   Alcohol, Ethyl (B) <10 <10 mg/dL    Comment: (NOTE) Lowest detectable limit for serum alcohol is 10 mg/dL. For medical purposes only. Performed at [facility], [address], [place], [place] [address]   TSH     Status: None   Collection Time: 06/19/18  2:47 PM  Result Value Ref Range   TSH 0.959 0.350 - 4.500 uIU/mL    Comment: Performed by a 3rd  Generation assay with a functional sensitivity of <=0.01 uIU/mL. Performed at [facility], [address] [place], [place] [address]   Magnesium     Status: Abnormal   Collection Time: 06/19/18  2:47 PM  Result Value Ref Range   Magnesium 2.8 (H) 1.7 - 2.4 mg/dL    Comment: Performed at [facility], [address] [place], [place] [address]    Dg Shoulder Right  Result Date: 06/19/2018 CLINICAL DATA:  Right shoulder pain for 3 days.  No reported injury. EXAM: RIGHT SHOULDER - 2+ VIEW COMPARISON:  None. FINDINGS: There is no evidence of fracture or dislocation. There is no evidence of arthropathy or other focal bone abnormality. Soft tissues are unremarkable. IMPRESSION: No acute osseous abnormality.  No malalignment. Electronically Signed   By: Delbert Phenix M.D.   On: 06/19/2018 16:50   Dg Chest Portable 1 View  Result Date: 06/19/2018 CLINICAL DATA:  Severe back pain EXAM: PORTABLE CHEST 1 VIEW COMPARISON:  None. FINDINGS: Cardiomegaly. Both lungs are clear. The visualized skeletal structures are unremarkable. IMPRESSION: Cardiomegaly without acute abnormality of the lungs in [facility] portable projection. Electronically Signed   By: Lauralyn Primes M.D.   On: 06/19/2018 15:44   Impression/Plan   53 y.o. male with diffuse wife spread joint pain including right lower back pain and BLE pain x3 days. MRI Lumbar spine reviewed reveals bilateral pars defects at L5 with resultant anterolisthesis of L5 on S1 and severe foraminal narrowing. Appears to have grossly normal strength in distal BLE and pain mediated weakness in proximal strength testing. There is no indication for emergent NS intervention  at present. Will await for his metabolic abnormalities (Cr 3.56, BUN 120, Na 122) to be corrected and reassess how patient feels after medical treatment.  - Pain control - Continue medical management per primary team.   Cindra PresumeVincent Cesareo Vickrey, PA-C [facility]
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
## 2018-06-19 NOTE — ED Provider Notes (Addendum)
[facility] EMERGENCY DEPARTMENT Provider Note   CSN: [id number] Arrival date & time: 06/19/18  1250    History   Chief Complaint Chief Complaint  Patient presents with   Generalized pain    HPI [name] is a 53 y.o. male.     HPI Patient presents to the ER for severe back pain.  Patient states his symptoms started after lifting a lawnmower.  Began having pain running down his left leg.  Patient states since this has all started his symptoms continue to get worse.  His pain is moving down both legs now.  He is having difficulty even standing now.  He requires 2 people helping him in order to walk or move around.  Pain is severe in his back goes down both legs.  He denies any numbness.  Patient was seen back in the emergency room on May 5.  He had plain film x-rays that were negative.  He ended up having a lumbar spine MRI that showed moderate to severe foraminal narrowing at L5-S1 that was worse on the left.  There appeared to be compression in the L5 foramina.  Patient was given medication and was instructed to follow-up with neurosurgery to his discharge paperwork.  Patient states that he did not understand that instruction.  He thought he was told nothing was wrong with his back.  He did not know that he was supposed to follow-up with neurosurgery.  Patient is here today because he continues to be in severe pain.    He is getting weaker and has trouble walking or moving his legs. He wants to be admitted to the hospital here. Past Medical History:  Diagnosis Date   Acid reflux    Head injury    Hepatitis C    Hiatal hernia     Patient Active Problem List   Diagnosis Date Noted   Hepatitis C antibody test positive 12/17/2013   Elevated BP 12/17/2013   Smoking 12/17/2013   Family history of diabetes mellitus (DM) 12/17/2013   Notalgia 12/17/2013   Screening 12/17/2013   Cellulitis 11/23/2013    Past Surgical History:  Procedure Laterality Date   FACIAL  FRACTURE SURGERY          Home Medications    Prior to Admission medications   Medication Sig Start Date End Date Taking? Authorizing Provider  cyclobenzaprine (FLEXERIL) 10 MG tablet Take 1 tablet (10 mg total) by mouth 3 (three) times daily as needed for muscle spasms. Patient not taking: Reported on 06/19/2018 06/13/18   Bethann Berkshire, MD  HYDROcodone-acetaminophen (NORCO/VICODIN) 5-325 MG tablet Take 1 tablet by mouth every 4 (four) hours as needed. Patient not taking: Reported on 06/13/2018 05/03/18   Burgess Amor, PA-C  oxyCODONE-acetaminophen (PERCOCET/ROXICET) 5-325 MG tablet Take 1 tablet by mouth every 6 (six) hours as needed. Patient not taking: Reported on 06/19/2018 06/13/18   Bethann Berkshire, MD  predniSONE (DELTASONE) 10 MG tablet Take 2 tablets (20 mg total) by mouth daily. Patient not taking: Reported on 06/19/2018 06/13/18   Bethann Berkshire, MD    Family History Family History  Problem Relation Age of Onset   Diabetes Mother    Hypertension Mother    Hypertension Father     Social History Social History   Tobacco Use   Smoking status: Current Every Day Smoker    Packs/day: 1.00    Years: 20.00    Pack years: 20.00    Types: Cigarettes   Smokeless tobacco: Never Used  Substance  Use Topics   Alcohol use: Not Currently   Drug use: Yes    Types: Cocaine    Comment: admits to last use 2-3 weeks ago     Allergies   Patient has no known allergies.   Review of Systems Review of Systems  Constitutional: Negative for fever.  Gastrointestinal: Negative for abdominal pain and blood in stool.  Genitourinary: Negative for dysuria.  Neurological: Negative for headaches.  All other systems reviewed and are negative.    Physical Exam Updated Vital Signs BP 127/88    Pulse (!) 112    Temp 97.8 F (36.6 C) (Oral)    Resp 16    Ht 1.778 m (5\' 10" )    Wt 74.8 kg    SpO2 92%    BMI 23.68 kg/m   Physical Exam Vitals signs and nursing note reviewed.    Constitutional:      General: He is in acute distress.     Appearance: He is well-developed.  HENT:     Head: Normocephalic and atraumatic.     Right Ear: External ear normal.     Left Ear: External ear normal.  Eyes:     General: No scleral icterus.       Right eye: No discharge.        Left eye: No discharge.     Conjunctiva/sclera: Conjunctivae normal.  Neck:     Musculoskeletal: Neck supple.     Trachea: No tracheal deviation.  Cardiovascular:     Rate and Rhythm: Regular rhythm. Tachycardia present.  Pulmonary:     Effort: Pulmonary effort is normal. No respiratory distress.     Breath sounds: Normal breath sounds. No stridor. No wheezing or rales.  Abdominal:     General: Bowel sounds are normal. There is no distension.     Palpations: Abdomen is soft.     Tenderness: There is no abdominal tenderness. There is no guarding or rebound.  Musculoskeletal:        General: No tenderness.     Lumbar back: He exhibits decreased range of motion. He exhibits no swelling.     Comments: Pt unable to stand without assitance, normal sensation bilateral lower extremity  Skin:    General: Skin is warm and dry.     Findings: No rash.  Neurological:     Cranial Nerves: No cranial nerve deficit (no facial droop, extraocular movements intact, no slurred speech).     Sensory: No sensory deficit.     Motor: No abnormal muscle tone or seizure activity.     Coordination: Coordination normal.     Comments: Pt is unable to lift either leg off the bed, unable to illicit bilateral patellar tendon reflexes      ED Treatments / Results  Labs (all labs ordered are listed, but only abnormal results are displayed) Labs Reviewed  CBC - Abnormal; Notable for the following components:      Result Value   WBC 18.9 (*)    RBC 3.99 (*)    Hemoglobin 12.6 (*)    HCT 34.5 (*)    MCHC 36.5 (*)    All other components within normal limits  SARS CORONAVIRUS 2 (HOSPITAL ORDER, PERFORMED IN [facility])  BASIC METABOLIC PANEL  URINALYSIS, ROUTINE W REFLEX MICROSCOPIC  RAPID URINE DRUG SCREEN, HOSP PERFORMED    EKG EKG Interpretation  Date/Time:  Monday Jun 19 2018 13:16:55 EDT Ventricular Rate:  133 PR Interval:    QRS Duration:  94 QT Interval:  314 QTC Calculation: 467 R Axis:   62 Text Interpretation:  Sinus tachycardia Multiform ventricular premature complexes Aberrant complex No old tracing to compare Confirmed by Linwood Dibbles [phone]) on 06/19/2018 2:19:26 PM   Radiology No results found.  Procedures .Critical Care Performed by: Linwood Dibbles, MD Authorized by: Linwood Dibbles, MD   Critical care provider statement:    Critical care time (minutes):  45   Critical care was time spent personally by me on the following activities:  Discussions with consultants, evaluation of patient's response to treatment, examination of patient, ordering and performing treatments and interventions, ordering and review of laboratory studies, ordering and review of radiographic studies, pulse oximetry, re-evaluation of patient's condition, obtaining history from patient or surrogate and review of old charts   (including critical care time)  Medications Ordered in ED Medications  HYDROmorphone (DILAUDID) injection 1 mg (has no administration in time range)  HYDROmorphone (DILAUDID) injection 1 mg (1 mg Intravenous Given 06/19/18 1329)  methylPREDNISolone sodium succinate (SOLU-MEDROL) 125 mg/2 mL injection 125 mg (125 mg Intravenous Given 06/19/18 1329)  sodium chloride 0.9 % bolus 1,000 mL (0 mLs Intravenous Stopped 06/19/18 1505)  LORazepam (ATIVAN) injection 1 mg (1 mg Intravenous Given 06/19/18 1503)     Initial Impression / Assessment and Plan / ED Course  I have reviewed the triage vital signs and the nursing notes.  Pertinent labs & imaging results that were available during my care of the patient were reviewed by me and considered in my medical decision making (see chart  for details).  Clinical Course as of Jun 18 1521  Mon Jun 19, 2018  1354 D/w Oswaldo Done, neurosurgery.  Would recommend admission for pain control, neurosurgery can evaluate at [facility] hospital.   [JK]  1445 Discussed with daughter.  She states pt does abuse drugs and alcohol  he does have history of hep c   [JK]  1447 Daugher can be reached at [phone]   [JK]  1518 Pain is better but pt is very somnolent now after his ativan and dilaudid.     [JK]  1519 Lab reviewed.  Leukocytosis noted.     [JK]    Clinical Course User Index [JK] Linwood Dibbles, MD     Patient presented with severe back pain.  Patient had a recent MRI that showed foraminal stenosis causing nerve impingement.  Patient symptoms have been getting worse.  Patient's exam is consistent with sciatica.  Because of the patient's increasing pain a consult with neurosurgery.  Recommend admission to [facility] where they will be able to evaluate him.  Patient was noted to be significantly tachycardic on arrival.  Patient is afebrile but he does have an elevated white blood cell count.  No other symptoms to suggest infection.  Is possible this may be related to stress demargination possibly a component of alcohol withdrawal and AKI related to prerenal azotemia.   He does not admit to any significant alcohol use but the patient's daughter indicates that he does have history of alcohol and substance abuse.  I will add on a UDS.  Had on her chest x-ray.  Will need to monitor hgb considering the decline.  Monitor stools as well.Have low suspicion for spinal infection considering his MRI just a few days ago.  I will consult with the medical service for admission and transfer down to [facility].  Final Clinical Impressions(s) / ED Diagnoses   Final diagnoses:  Sciatica, unspecified laterality  Tachycardia        Linwood Dibbles, MD 06/19/18 [phone]

## 2018-06-19 NOTE — H&P (Addendum)
History and Physical    [name] AVW:[id number] DOB: [birth year] DOA: 06/19/2018  PCP: Patient, No Pcp Per   Patient coming from: Home  I have personally briefly reviewed patient's old medical records in [facility]  Chief Complaint: Back Pain  HPI: [name] is a 53 y.o. male with medical history significant for hepatitis C. Hx is obtained mostly from chart review as at the time of my evaluation patient is s/p Dilaudid and Ativan and unable to give me details of history, except to tell me he hurts all over and did not fall.  Patient presented to the ED with complaints of increasing back pain, radiating down his left leg, pain now going down both legs.  Patient has difficulty standing, or walking without assistance.  Pain started about after patient attempted to lift a lawnmower over a week ago.  Patient came to the ED May 5th, MRI showed severe foraminal narrowing at L5-S1, due to anterolisthesis and disc appeared worse on left.  Possible to follow-up with neurosurgery as outpatient, but he said he did not understand the instructions.  Patient was discharged on prednisone and Percocets. Patient denied vomiting and loose stools.  DAughter called that patient history of street drug use-cocaine.  Last use 2 to 3 weeks ago.  ED Course: Initially 135 improved to 105 after Ativan and Dilaudid given.  WBC 18.9.  Sodium low 122, potassium low 3.3.  Creatinine elevated 3.56 from baseline 0.8-1.  EKG sinus rhythm, unchanged from prior.  Portable chest x-ray negative for acute abnormality.  Patient was given 1 L bolus in the ED, 1 mg Ativan,  Dilaudid, 125 mg Solu-Medrol.. EDP talked to neurosurgery on-call, Costella PA, recommended transfer to [facility] neurosurgery evaluation.  Review of Systems: Unable to obtain due to patient's mental status..  Past Medical History:  Diagnosis Date  . Acid reflux   . Head injury   . Hepatitis C   . Hiatal hernia     Past Surgical History:   Procedure Laterality Date  . FACIAL FRACTURE SURGERY       reports that he has been smoking cigarettes. He has a 20.00 pack-year smoking history. He has never used smokeless tobacco. He reports previous alcohol use. He reports current drug use. Drug: Cocaine.  No Known Allergies  Family History  Problem Relation Age of Onset  . Diabetes Mother   . Hypertension Mother   . Hypertension Father     Prior to Admission medications   Medication Sig Start Date End Date Taking? Authorizing Provider  cyclobenzaprine (FLEXERIL) 10 MG tablet Take 1 tablet (10 mg total) by mouth 3 (three) times daily as needed for muscle spasms. Patient not taking: Reported on 06/19/2018 06/13/18   Bethann Berkshire, MD  HYDROcodone-acetaminophen (NORCO/VICODIN) 5-325 MG tablet Take 1 tablet by mouth every 4 (four) hours as needed. Patient not taking: Reported on 06/13/2018 05/03/18   Burgess Amor, PA-C  oxyCODONE-acetaminophen (PERCOCET/ROXICET) 5-325 MG tablet Take 1 tablet by mouth every 6 (six) hours as needed. Patient not taking: Reported on 06/19/2018 06/13/18   Bethann Berkshire, MD  predniSONE (DELTASONE) 10 MG tablet Take 2 tablets (20 mg total) by mouth daily. Patient not taking: Reported on 06/19/2018 06/13/18   Bethann Berkshire, MD    Physical Exam: Vitals:   06/19/18 1450 06/19/18 1500 06/19/18 1506 06/19/18 1520  BP:  127/88    Pulse: (!) 112 (!) 112 (!) 112 (!) 112  Resp: Temp:  TempSrc:      SpO2: 93% 91% 92% 92%  Weight:      Height:        Constitutional: Very drowsy but arouses to voice reports some drop back to sleep, appears comfortable unless when moving his extremities Vitals:   06/19/18 1450 06/19/18 1500 06/19/18 1506 06/19/18 1520  BP:  127/88    Pulse: (!) 112 (!) 112 (!) 112 (!) 112  Resp: 17 16 17 16   Temp:      TempSrc:      SpO2: 93% 91% 92% 92%  Weight:      Height:       Eyes: PERRL, lids and conjunctivae normal ENMT: Mucous membranes are moist. Posterior pharynx  clear of any exudate or lesions. Neck: normal, supple, no masses, no thyromegaly Respiratory: Mild congestion on anterior auscultation only otherwise clear to auscultation bilaterally, no wheezing, no crackles. Normal respiratory effort. No accessory muscle use.  Cardiovascular: Tachycardiac but regularrate and rhythm, no murmurs / rubs / gallops. No extremity edema. 2+ pedal pulses. No carotid bruits.  Abdomen: no tenderness, no masses palpated. No hepatosplenomegaly. Bowel sounds positive.  Musculoskeletal: no clubbing / cyanosis.  Inability to lift right shoulder secondary to pain, has some swelling/mild induration without obvious bruising to lateral side of right shoulder, area mildly tender.  Pain on movement of his bilateral lower extremities L >>R, complains of left knee pain, but no obvious swelling to knees, no erythema. Skin: no rashes, lesions, ulcers. No induration Neurologic: Obvious cranial nerve abnormality, unable to do full neuro exam due to drowsiness but moving all extremities spontaneously.  Psychiatric: Unable to ascertain, drowsy,  Labs on Admission: I have personally reviewed following labs and imaging studies  CBC: Recent Labs  Lab 06/19/18 1447  WBC 18.9*  HGB 12.6*  HCT 34.5*  MCV 86.5  PLT 299   Basic Metabolic Panel: Recent Labs  Lab 06/19/18 1447  NA 122*  K 3.3*  CL 85*  CO2 22  GLUCOSE 153*  BUN 120*  CREATININE 3.56*  CALCIUM 7.4*   Radiological Exams on Admission: Dg Shoulder Right  Result Date: 06/19/2018 CLINICAL DATA:  Right shoulder pain for 3 days.  No reported injury. EXAM: RIGHT SHOULDER - 2+ VIEW COMPARISON:  None. FINDINGS: There is no evidence of fracture or dislocation. There is no evidence of arthropathy or other focal bone abnormality. Soft tissues are unremarkable. IMPRESSION: No acute osseous abnormality.  No malalignment. Electronically Signed   By: Delbert Phenix M.D.   On: 06/19/2018 16:50   Dg Chest Portable 1 View  Result  Date: 06/19/2018 CLINICAL DATA:  Severe back pain EXAM: PORTABLE CHEST 1 VIEW COMPARISON:  None. FINDINGS: Cardiomegaly. Both lungs are clear. The visualized skeletal structures are unremarkable. IMPRESSION: Cardiomegaly without acute abnormality of the lungs in AP portable projection. Electronically Signed   By: Lauralyn Primes M.D.   On: 06/19/2018 15:44    EKG: Independently reviewed.  Sinus rhythm.  QTc 467.  EKG unchanged from prior.  Assessment/Plan Active Problems:   Intractable back pain  Intractable back pain- 2/2 with Sciatica to bilat legs, inability to ambulate-weakness versus pain.  On recent ED visits, MRI 5/5-moderately severe to severe foraminal narrowing at L5-S1 due to anterolisthesis and disc appears worse on the left.  Central canal is open. EDP talked to neurosurgery recommended transfer to [name].  125 mg Solu-Medrol given in ED -N.p.o. midnight -IV morphine 2 mg  as needed -repeat IV Solu-Medrol, 60mg  x 1 in  a.m. -RN to page neurology on patient's arrival to floor.  Hyponatremia-sodium 122.  Baseline ~133-138, also mild hypokalemia- 3.3, tachycardic, with significant acute kidney injury.  Suggesting possible hypovolemic hyponatremia.  Unable to ascertain alcohol use.  1 Liter bolus given in ED. - N/s 100cc/hr  -BMP a.m. -Urine sodium, osmolality -Serum osmolality - TSH - Kdur 40meq x 2  Acute kidney injury- Cr 3.56, recent baseline 0.8-1.  Likely prerenal, considering marked elevated BUN at 120.  Unable to a certain NSAID use. -Hydrate -BMP a.m.  Substance abuse- daughter reports cocaine use.  Unable to ascertain alcohol use.  - UDS -Blood alcohol level- <10. - CIWA, thiamin, folate multivitamins - Mag- 2.8  Tachycardia-dehydration versus substance abuse related - Hydrate  Right shoulder pain-able to lift right upper extremity, full exam is limited by patient's drowsiness at this time.  He has some firmness-2 lateral side of his left shoulder, possibly 2/2.  -Right shoulder x-ray-negative for fracture, unremarkable soft tissue -Please Reassess in a.m, if further imaging is needed.  Leukocytosis-WBC 18.9. Portable chest x-ray clear.  UA pending.  Unable to clarify genito-urinary or GI symptoms. Afebrile.  SARS-CoV-2 test negative.  Possibly de-marginalization from Steroids-recent prescription from the ED or stress de-marginalization.   -F/u UA  Hepatitis C  HIV part of routine health screen  SARs-Covid 2 -negative   DVT prophylaxis: Scds pending neuroevaluation Code Status: Full Family Communication: None at bedside Disposition Plan: Per rounding team Consults called: Neurosurgery Admission status: Obs, tele   Onnie BoerEjiroghene E Emokpae MD [facility]  06/19/2018, 3:41 PM

## 2018-06-20 ENCOUNTER — Observation Stay (HOSPITAL_COMMUNITY): Payer: Medicaid Other

## 2018-06-20 ENCOUNTER — Inpatient Hospital Stay (HOSPITAL_COMMUNITY): Payer: Medicaid Other

## 2018-06-20 DIAGNOSIS — M009 Pyogenic arthritis, unspecified: Secondary | ICD-10-CM | POA: Diagnosis present

## 2018-06-20 DIAGNOSIS — I10 Essential (primary) hypertension: Secondary | ICD-10-CM | POA: Diagnosis present

## 2018-06-20 DIAGNOSIS — R609 Edema, unspecified: Secondary | ICD-10-CM

## 2018-06-20 DIAGNOSIS — E876 Hypokalemia: Secondary | ICD-10-CM | POA: Diagnosis present

## 2018-06-20 DIAGNOSIS — E441 Mild protein-calorie malnutrition: Secondary | ICD-10-CM | POA: Diagnosis present

## 2018-06-20 DIAGNOSIS — F17210 Nicotine dependence, cigarettes, uncomplicated: Secondary | ICD-10-CM | POA: Diagnosis present

## 2018-06-20 DIAGNOSIS — I82711 Chronic embolism and thrombosis of superficial veins of right upper extremity: Secondary | ICD-10-CM | POA: Diagnosis present

## 2018-06-20 DIAGNOSIS — D638 Anemia in other chronic diseases classified elsewhere: Secondary | ICD-10-CM | POA: Diagnosis not present

## 2018-06-20 DIAGNOSIS — L03113 Cellulitis of right upper limb: Secondary | ICD-10-CM | POA: Diagnosis present

## 2018-06-20 DIAGNOSIS — E869 Volume depletion, unspecified: Secondary | ICD-10-CM | POA: Diagnosis present

## 2018-06-20 DIAGNOSIS — G9341 Metabolic encephalopathy: Secondary | ICD-10-CM | POA: Diagnosis present

## 2018-06-20 DIAGNOSIS — R000 Tachycardia, unspecified: Secondary | ICD-10-CM | POA: Diagnosis present

## 2018-06-20 DIAGNOSIS — Z20828 Contact with and (suspected) exposure to other viral communicable diseases: Secondary | ICD-10-CM | POA: Diagnosis present

## 2018-06-20 DIAGNOSIS — K6812 Psoas muscle abscess: Secondary | ICD-10-CM | POA: Diagnosis present

## 2018-06-20 DIAGNOSIS — K219 Gastro-esophageal reflux disease without esophagitis: Secondary | ICD-10-CM | POA: Diagnosis present

## 2018-06-20 DIAGNOSIS — M23204 Derangement of unspecified medial meniscus due to old tear or injury, left knee: Secondary | ICD-10-CM | POA: Diagnosis present

## 2018-06-20 DIAGNOSIS — K5900 Constipation, unspecified: Secondary | ICD-10-CM | POA: Diagnosis not present

## 2018-06-20 DIAGNOSIS — A419 Sepsis, unspecified organism: Secondary | ICD-10-CM | POA: Diagnosis present

## 2018-06-20 DIAGNOSIS — M4658 Other infective spondylopathies, sacral and sacrococcygeal region: Secondary | ICD-10-CM | POA: Diagnosis present

## 2018-06-20 DIAGNOSIS — L02413 Cutaneous abscess of right upper limb: Secondary | ICD-10-CM | POA: Diagnosis present

## 2018-06-20 DIAGNOSIS — B1920 Unspecified viral hepatitis C without hepatic coma: Secondary | ICD-10-CM | POA: Diagnosis present

## 2018-06-20 DIAGNOSIS — E871 Hypo-osmolality and hyponatremia: Secondary | ICD-10-CM | POA: Diagnosis present

## 2018-06-20 DIAGNOSIS — L03114 Cellulitis of left upper limb: Secondary | ICD-10-CM | POA: Diagnosis present

## 2018-06-20 DIAGNOSIS — D509 Iron deficiency anemia, unspecified: Secondary | ICD-10-CM | POA: Diagnosis present

## 2018-06-20 DIAGNOSIS — J189 Pneumonia, unspecified organism: Secondary | ICD-10-CM | POA: Diagnosis not present

## 2018-06-20 DIAGNOSIS — N179 Acute kidney failure, unspecified: Secondary | ICD-10-CM | POA: Diagnosis present

## 2018-06-20 LAB — BASIC METABOLIC PANEL
Anion gap: 20 — ABNORMAL HIGH (ref 5–15)
BUN: 116 mg/dL — ABNORMAL HIGH (ref 6–20)
CO2: 19 mmol/L — ABNORMAL LOW (ref 22–32)
Calcium: 7.7 mg/dL — ABNORMAL LOW (ref 8.9–10.3)
Chloride: 86 mmol/L — ABNORMAL LOW (ref 98–111)
Creatinine, Ser: 2.76 mg/dL — ABNORMAL HIGH (ref 0.61–1.24)
GFR calc Af Amer: 29 mL/min — ABNORMAL LOW (ref >60)
GFR calc non Af Amer: 25 mL/min — ABNORMAL LOW (ref >60)
Glucose, Bld: 142 mg/dL — ABNORMAL HIGH (ref 70–99)
Potassium: 3.9 mmol/L (ref 3.5–5.1)
Sodium: 125 mmol/L — ABNORMAL LOW (ref 135–145)

## 2018-06-20 LAB — COMPREHENSIVE METABOLIC PANEL
ALT: 49 U/L — ABNORMAL HIGH (ref 0–44)
AST: 58 U/L — ABNORMAL HIGH (ref 15–41)
Albumin: 1.3 g/dL — ABNORMAL LOW (ref 3.5–5.0)
Alkaline Phosphatase: 82 U/L (ref 38–126)
Anion gap: 12 (ref 5–15)
BUN: 109 mg/dL — ABNORMAL HIGH (ref 6–20)
CO2: 21 mmol/L — ABNORMAL LOW (ref 22–32)
Calcium: 7.7 mg/dL — ABNORMAL LOW (ref 8.9–10.3)
Chloride: 94 mmol/L — ABNORMAL LOW (ref 98–111)
Creatinine, Ser: 2.55 mg/dL — ABNORMAL HIGH (ref 0.61–1.24)
GFR calc Af Amer: 32 mL/min — ABNORMAL LOW (ref >60)
GFR calc non Af Amer: 28 mL/min — ABNORMAL LOW (ref >60)
Glucose, Bld: 140 mg/dL — ABNORMAL HIGH (ref 70–99)
Potassium: 3.9 mmol/L (ref 3.5–5.1)
Sodium: 127 mmol/L — ABNORMAL LOW (ref 135–145)
Total Bilirubin: 1.1 mg/dL (ref 0.3–1.2)
Total Protein: 6 g/dL — ABNORMAL LOW (ref 6.5–8.1)

## 2018-06-20 LAB — OSMOLALITY, URINE: Osmolality, Ur: 532 mOsm/kg (ref 300–900)

## 2018-06-20 LAB — SEDIMENTATION RATE: Sed Rate: 80 mm/hr — ABNORMAL HIGH (ref 0–16)

## 2018-06-20 LAB — SODIUM, URINE, RANDOM: Sodium, Ur: 11 mmol/L

## 2018-06-20 LAB — CBC
HCT: 31.9 % — ABNORMAL LOW (ref 39.0–52.0)
Hemoglobin: 11.6 g/dL — ABNORMAL LOW (ref 13.0–17.0)
MCH: 31.2 pg (ref 26.0–34.0)
MCHC: 36.4 g/dL — ABNORMAL HIGH (ref 30.0–36.0)
MCV: 85.8 fL (ref 80.0–100.0)
Platelets: 297 10*3/uL (ref 150–400)
RBC: 3.72 MIL/uL — ABNORMAL LOW (ref 4.22–5.81)
RDW: 14.1 % (ref 11.5–15.5)
WBC: 24 10*3/uL — ABNORMAL HIGH (ref 4.0–10.5)
nRBC: 0.1 % (ref 0.0–0.2)

## 2018-06-20 LAB — MAGNESIUM: Magnesium: 2.6 mg/dL — ABNORMAL HIGH (ref 1.7–2.4)

## 2018-06-20 LAB — C-REACTIVE PROTEIN: CRP: 24.1 mg/dL — ABNORMAL HIGH (ref <1.0)

## 2018-06-20 LAB — CREATININE, URINE, RANDOM: Creatinine, Urine: 56.86 mg/dL

## 2018-06-20 LAB — HIV ANTIBODY (ROUTINE TESTING W REFLEX): HIV Screen 4th Generation wRfx: NONREACTIVE

## 2018-06-20 LAB — OSMOLALITY: Osmolality: 304 mOsm/kg — ABNORMAL HIGH (ref 275–295)

## 2018-06-20 LAB — CK: Total CK: 103 U/L (ref 49–397)

## 2018-06-20 LAB — PHOSPHORUS: Phosphorus: 4 mg/dL (ref 2.5–4.6)

## 2018-06-20 LAB — LACTIC ACID, PLASMA: Lactic Acid, Venous: 1.4 mmol/L (ref 0.5–1.9)

## 2018-06-20 MED ORDER — SODIUM CHLORIDE 0.9 % IV SOLN
1.0000 g | INTRAVENOUS | Status: DC
  Administered 2018-06-20 – 2018-06-23 (×4): 1 g via INTRAVENOUS
  Filled 2018-06-20 (×4): qty 10

## 2018-06-20 MED ORDER — METHOCARBAMOL 500 MG PO TABS
500.0000 mg | ORAL_TABLET | Freq: Three times a day (TID) | ORAL | Status: DC
  Administered 2018-06-20 – 2018-08-10 (×144): 500 mg via ORAL
  Filled 2018-06-20 (×146): qty 1

## 2018-06-20 MED ORDER — ALUM & MAG HYDROXIDE-SIMETH 200-200-20 MG/5ML PO SUSP
30.0000 mL | ORAL | Status: DC | PRN
  Administered 2018-06-20 – 2018-06-21 (×2): 30 mL via ORAL
  Filled 2018-06-20 (×2): qty 30

## 2018-06-20 MED ORDER — PANTOPRAZOLE SODIUM 40 MG IV SOLR
40.0000 mg | Freq: Once | INTRAVENOUS | Status: AC
  Administered 2018-06-20: 15:00:00 40 mg via INTRAVENOUS
  Filled 2018-06-20: qty 40

## 2018-06-20 NOTE — Progress Notes (Signed)
On assessment pt R arm swollen, red, and warm to touch. No IV present in that arm. T: 99.3. HR: 116. MD. Allena Katz notified. Nurse will continue to monitor.

## 2018-06-20 NOTE — Progress Notes (Signed)
RUE venous duplex       has been completed. Preliminary results can be found under CV proc through chart review. Kaina Orengo, BS, RDMS, RVT   

## 2018-06-20 NOTE — Progress Notes (Signed)
[facility] Progress Note  Patient: [name] QHU:[id number]   PCP: Patient, No Pcp Per DOB: 05-06-1965   DOA: 06/19/2018   DOS: 06/20/2018   Date of Service: the patient was seen and examined on 06/20/2018  Brief hospital course: Pt. with PMH of hep C, substance abuse; admitted on 06/19/2018, presented with complaint of back pain, was found to have musculoskeletal back pain as well as right upper extremity cellulitis. Currently further plan is continue current care with focus on treating cellulitis with antibiotics, AKI with fluids and pain control and monitor for withdrawal.  Subjective: The patient is actually reporting pain in his right arm.  Also has pain in his back.  During the conversation patient is confused and becomes teary-eyed because of the severe back pain as well.  No nausea no vomiting.  No chest pain abdominal pain.  No diarrhea no constipation.  Assessment and Plan: 1.  Intractable back pain. Musculoskeletal in nature. MRI shows moderate to severe foraminal narrowing of L5-S1. Initially presented at [facility] transfer to [facility] for neurosurgery evaluation. Neurosurgery recommended medical management for now no indication for acute surgery. Patient is neurologically intact. Patient received 2 doses of Solu-Medrol. Continue pain control for now. Add scheduled Robaxin. PT OT.  2.  Sepsis secondary to right upper extremity cellulitis.  POA Patient has severe swelling starting from shoulder all the way up to his fingers. There is also redness. Patient is able to extend the elbow but with limitation. Also able to extend his fingers and make a fist but again with limitation due to pain. Patient has good pulse and no loss of sensation. CK normal. ESR and CRP significantly elevated. We will treat with IV ceftriaxone. Follow-up on blood cultures. X-ray shoulder and humerus does not show any evidence of gas-forming. Discussed with orthopedic on-call recommend conservative  management for now. If the swelling worsens or CK rises patient will require evaluation by orthopedics. Ultrasound Doppler upper extremity negative for DVT. We will also check ultrasound Doppler arterial as this can also represent an arterial blockage. Patient does have some injury signs-scabbed areas which might be the source of infection.  3.  Acute kidney injury. Hyponatremia.  Hypoosmolar Hypokalemia Potassium currently being replaced. Continue aggressive IV hydration for acute kidney injury. Likely prerenal in etiology. Ultrasound renal negative for any acute abnormality. UA bland. CK normal.  4.  Hypoalbuminemia. Elevated LFT. Monitor for now.  5.  Acute metabolic and toxic encephalopathy likely in the setting of sepsis as well as withdrawal. Substance abuse. UDS positive for cocaine. Patient did receive benzo prior to performing UDS. Monitor for withdrawal. Currently a CIWA protocol as the patient is unable to corroborate alcohol abuse history.  Diet: renal diet DVT Prophylaxis: subcutaneous Heparin  Advance goals of care discussion: full code  Family Communication: no family was present at bedside, at the time of interview.  The pt provided permission to discuss medical plan with the family. Opportunity was given to ask question and all questions were answered satisfactorily.   Disposition:  Discharge to home.  Consultants: Neurosurgery Procedures: none  Scheduled Meds:  folic acid  1 mg Oral Daily   methocarbamol  500 mg Oral TID   multivitamin with minerals  1 tablet Oral Daily   thiamine  100 mg Oral Daily   Or   thiamine  100 mg Intravenous Daily   Continuous Infusions:  sodium chloride 100 mL/hr at 06/19/18 1856   cefTRIAXone (ROCEPHIN)  IV 1 g (06/20/18 0850)   PRN  Meds: acetaminophen **OR** acetaminophen, alum & mag hydroxide-simeth, LORazepam **OR** LORazepam, morphine injection, ondansetron **OR** ondansetron (ZOFRAN) IV, polyethylene  glycol Antibiotics: Anti-infectives (From admission, onward)   Start     Dose/Rate Route Frequency Ordered Stop   06/20/18 0900  cefTRIAXone (ROCEPHIN) 1 g in sodium chloride 0.9 % 100 mL IVPB     1 g 200 mL/hr over 30 Minutes Intravenous Every 24 hours 06/20/18 0824         Objective: Physical Exam: Vitals:   06/20/18 0322 06/20/18 0740 06/20/18 1204 06/20/18 1511  BP: (!) 144/90 (!) 138/94 (!) 146/81 (!) 137/93  Pulse: (!) 107 (!) 116 (!) 119 (!) 114  Resp:  _0 Temp: 98.3 F (36.8 C) 99.3 F (37.4 C) 99.6 F (37.6 C) 99 F (37.2 C)  TempSrc: Oral Oral Axillary Oral  SpO2: 96% 95% 94% 93%  Weight:      Height:        Intake/Output Summary (Last 24 hours) at 06/20/2018 1746 Last data filed at 06/20/2018 1500 Gross per 24 hour  Intake 1200 ml  Output 1900 ml  Net -700 ml   Filed Weights   06/19/18 1303  Weight: 74.8 kg   General: Alert, Awake and Oriented to Time, Place and Person. Appear in marked distress, affect blunted Eyes: PERRL, Conjunctiva normal ENT: Oral Mucosa clear moist. Neck: no JVD, no Abnormal Mass Or lumps Cardiovascular: S1 and S2 Present, no Murmur, Peripheral Pulses Present Respiratory: normal respiratory effort, Bilateral Air entry equal and Decreased, n use of accessory muscle, noClear to Auscultation, no Crackles, no wheezes Abdomen: Bowel Sound present, Soft and no tenderness, no hernia Skin: Right upper extremity redness, no Rash, no induration Extremities: Right upper extremity edema, no pedal edema, no calf tenderness Neurologic: Grossly no focal neuro deficit. Bilaterally Equal motor strength  Data Reviewed: CBC: Recent Labs  Lab 06/19/18 1447 06/20/18 0445  WBC 18.9* 24.0*  HGB 12.6* 11.6*  HCT 34.5* 31.9*  MCV 86.5 85.8  PLT 299 810   Basic Metabolic Panel: Recent Labs  Lab 06/19/18 1447 06/20/18 0445 06/20/18 0912  NA 122* 125* 127*  K 3.3* 3.9 3.9  CL 85* 86* 94*  CO2 22 19* 21*  GLUCOSE 153* 142* 140*  BUN  120* 116* 109*  CREATININE 3.56* 2.76* 2.55*  CALCIUM 7.4* 7.7* 7.7*  MG 2.8*  --  2.6*  PHOS  --   --  4.0    Liver Function Tests: Recent Labs  Lab 06/20/18 0912  AST 58*  ALT 49*  ALKPHOS 82  BILITOT 1.1  PROT 6.0*  ALBUMIN 1.3*   No results for input(s): LIPASE, AMYLASE in the last 168 hours. No results for input(s): AMMONIA in the last 168 hours. Coagulation Profile: No results for input(s): INR, PROTIME in the last 168 hours. Cardiac Enzymes: Recent Labs  Lab 06/20/18 0912  CKTOTAL 103   BNP (last 3 results) No results for input(s): PROBNP in the last 8760 hours. CBG: No results for input(s): GLUCAP in the last 168 hours. Studies: Dg Forearm Right  Result Date: 06/20/2018 CLINICAL DATA:  Pain and swelling EXAM: RIGHT FOREARM - 2 VIEW COMPARISON:  None. FINDINGS: No bone or joint finding. Possible soft tissue swelling of the forearm. IMPRESSION: No bone or joint finding. Electronically Signed   By: Nelson Chimes M.D.   On: 06/20/2018 14:05   Dg Wrist Complete Right  Result Date: 06/20/2018 CLINICAL DATA:  Pain and swelling. EXAM: RIGHT WRIST - COMPLETE 3+  VIEW COMPARISON:  None. FINDINGS: There is no evidence of fracture or dislocation. There is no evidence of arthropathy or other focal bone abnormality. Soft tissues are unremarkable. IMPRESSION: Negative. Electronically Signed   By: Nelson Chimes M.D.   On: 06/20/2018 14:06   US Renal  Result Date: 06/20/2018 CLINICAL DATA:  Acute kidney injury. EXAM: RENAL / URINARY TRACT ULTRASOUND COMPLETE COMPARISON:  None. FINDINGS: Right Kidney: Renal measurements: 12.5 x 5.2 x 7.8 cm = volume: 266 mL. Increased echogenicity of the renal parenchyma. No hydronephrosis or focal lesion. Left Kidney: Renal measurements: 13.2 x 6.5 x 6.3 cm = volume: 281 mL. Echogenicity within normal limits. No mass or hydronephrosis visualized. Bladder: Increased echogenicity of the renal parenchyma. No hydronephrosis or focal lesion. Appears normal  for degree of bladder distention. IMPRESSION: Borderline enlarged echogenic kidneys as might be seen with acute kidney injury or nephritis. No evidence of obstruction, atrophy or focal lesion. Electronically Signed   By: Nelson Chimes M.D.   On: 06/20/2018 10:08   Dg Humerus Right  Result Date: 06/20/2018 CLINICAL DATA:  Pain and swelling EXAM: RIGHT HUMERUS - 2+ VIEW COMPARISON:  None. FINDINGS: There is no evidence of fracture or other focal bone lesions. Soft tissues are unremarkable. IMPRESSION: Negative. Electronically Signed   By: Nelson Chimes M.D.   On: 06/20/2018 14:06   Vas [place] Upper Extremity Venous Duplex  Result Date: 06/20/2018 UPPER VENOUS STUDY  Indications: Swelling Performing Technologist: June Leap RDMS, RVT  Examination Guidelines: A complete evaluation includes B-mode imaging, spectral Doppler, color Doppler, and power Doppler as needed of all accessible portions of each vessel. Bilateral testing is considered an integral part of a complete examination. Limited examinations for reoccurring indications may be performed as noted.  Right Findings: +----------+------------+---------+-----------+----------+-------+  RIGHT      Compressible Phasicity Spontaneous Properties Summary  +----------+------------+---------+-----------+----------+-------+  IJV            Full        Yes        Yes                         +----------+------------+---------+-----------+----------+-------+  Subclavian     Full        Yes        Yes                         +----------+------------+---------+-----------+----------+-------+  Axillary       Full        Yes        Yes                         +----------+------------+---------+-----------+----------+-------+  Brachial       Full        Yes        Yes                         +----------+------------+---------+-----------+----------+-------+  Radial         Full                                                +----------+------------+---------+-----------+----------+-------+  Ulnar          Full                                               +----------+------------+---------+-----------+----------+-------+  Cephalic     Partial                                     Chronic  +----------+------------+---------+-----------+----------+-------+  Basilic        Full                                               +----------+------------+---------+-----------+----------+-------+  Left Findings: +----------+------------+---------+-----------+----------+-------+  LEFT       Compressible Phasicity Spontaneous Properties Summary  +----------+------------+---------+-----------+----------+-------+  Subclavian                 Yes        Yes                         +----------+------------+---------+-----------+----------+-------+  Summary:  Right: No evidence of deep vein thrombosis in the upper extremity. Findings consistent with chronic superficial vein thrombosis involving the right cephalic vein, isolated area at antecubital fossa.  Left: No evidence of thrombosis in the subclavian.  *See table(s) above for measurements and observations.    Preliminary   °  ° °Time spent: 35 minutes ° °Author: ° , MD °[facility] °06/20/2018 5:46 PM ° °To reach On-call, see care teams to locate the attending and reach out to them via [email or web address]. °If 7PM-7AM, please contact night-coverage °If you still have difficulty reaching the attending provider, please page the DOC (Director on Call) for [facility] on amion for assistance.  °

## 2018-06-21 ENCOUNTER — Inpatient Hospital Stay (HOSPITAL_COMMUNITY): Payer: Medicaid Other

## 2018-06-21 DIAGNOSIS — N179 Acute kidney failure, unspecified: Secondary | ICD-10-CM | POA: Diagnosis present

## 2018-06-21 DIAGNOSIS — M79609 Pain in unspecified limb: Secondary | ICD-10-CM

## 2018-06-21 DIAGNOSIS — L03811 Cellulitis of head [any part, except face]: Secondary | ICD-10-CM

## 2018-06-21 DIAGNOSIS — M4807 Spinal stenosis, lumbosacral region: Secondary | ICD-10-CM | POA: Diagnosis present

## 2018-06-21 DIAGNOSIS — M7989 Other specified soft tissue disorders: Secondary | ICD-10-CM

## 2018-06-21 HISTORY — DX: Acute kidney failure, unspecified: N17.9

## 2018-06-21 LAB — CBC WITH DIFFERENTIAL/PLATELET
Abs Immature Granulocytes: 0.6 10*3/uL — ABNORMAL HIGH (ref 0.00–0.07)
Band Neutrophils: 22 %
Basophils Absolute: 0 10*3/uL (ref 0.0–0.1)
Basophils Relative: 0 %
Eosinophils Absolute: 0 10*3/uL (ref 0.0–0.5)
Eosinophils Relative: 0 %
HCT: 27.4 % — ABNORMAL LOW (ref 39.0–52.0)
Hemoglobin: 9.8 g/dL — ABNORMAL LOW (ref 13.0–17.0)
Lymphocytes Relative: 0 %
Lymphs Abs: 0 10*3/uL — ABNORMAL LOW (ref 0.7–4.0)
MCH: 31.5 pg (ref 26.0–34.0)
MCHC: 35.8 g/dL (ref 30.0–36.0)
MCV: 88.1 fL (ref 80.0–100.0)
Metamyelocytes Relative: 2 %
Monocytes Absolute: 0.6 10*3/uL (ref 0.1–1.0)
Monocytes Relative: 2 %
Neutro Abs: 29 10*3/uL — ABNORMAL HIGH (ref 1.7–7.7)
Neutrophils Relative %: 74 %
Platelets: 307 10*3/uL (ref 150–400)
RBC: 3.11 MIL/uL — ABNORMAL LOW (ref 4.22–5.81)
RDW: 14.6 % (ref 11.5–15.5)
WBC: 30.2 10*3/uL — ABNORMAL HIGH (ref 4.0–10.5)
nRBC: 0.1 % (ref 0.0–0.2)

## 2018-06-21 LAB — COMPREHENSIVE METABOLIC PANEL
ALT: 37 U/L (ref 0–44)
AST: 43 U/L — ABNORMAL HIGH (ref 15–41)
Albumin: 1.1 g/dL — ABNORMAL LOW (ref 3.5–5.0)
Alkaline Phosphatase: 80 U/L (ref 38–126)
Anion gap: 9 (ref 5–15)
BUN: 86 mg/dL — ABNORMAL HIGH (ref 6–20)
CO2: 21 mmol/L — ABNORMAL LOW (ref 22–32)
Calcium: 7.7 mg/dL — ABNORMAL LOW (ref 8.9–10.3)
Chloride: 101 mmol/L (ref 98–111)
Creatinine, Ser: 1.94 mg/dL — ABNORMAL HIGH (ref 0.61–1.24)
GFR calc Af Amer: 44 mL/min — ABNORMAL LOW (ref >60)
GFR calc non Af Amer: 38 mL/min — ABNORMAL LOW (ref >60)
Glucose, Bld: 138 mg/dL — ABNORMAL HIGH (ref 70–99)
Potassium: 4 mmol/L (ref 3.5–5.1)
Sodium: 131 mmol/L — ABNORMAL LOW (ref 135–145)
Total Bilirubin: 0.9 mg/dL (ref 0.3–1.2)
Total Protein: 5.7 g/dL — ABNORMAL LOW (ref 6.5–8.1)

## 2018-06-21 LAB — PATHOLOGIST SMEAR REVIEW

## 2018-06-21 LAB — CK: Total CK: 85 U/L (ref 49–397)

## 2018-06-21 LAB — MAGNESIUM: Magnesium: 2.7 mg/dL — ABNORMAL HIGH (ref 1.7–2.4)

## 2018-06-21 LAB — C4 COMPLEMENT: Complement C4, Body Fluid: 6 mg/dL — ABNORMAL LOW (ref 14–44)

## 2018-06-21 LAB — C3 COMPLEMENT: C3 Complement: 91 mg/dL (ref 82–167)

## 2018-06-21 MED ORDER — PANTOPRAZOLE SODIUM 40 MG PO TBEC
40.0000 mg | DELAYED_RELEASE_TABLET | Freq: Two times a day (BID) | ORAL | Status: DC
  Administered 2018-06-21 – 2018-06-25 (×8): 40 mg via ORAL
  Filled 2018-06-21 (×8): qty 1

## 2018-06-21 NOTE — Progress Notes (Signed)
CM spoke with daughter, [name], over the phone about the plan for her father. She is concerned about him being d/ced too soon d/t him not having insurance. I reassured her that the MD would not d/c him until he felt her father was medically ready no matter the type or lack of insurance.  We also discussed that if needed HH could be set up for him if recommended, and if he qualifies for charity services. We also discussed charity DME.  She is also interested in possibly having her father d/c to drug rehab if he will agee. CM informed her that either she or the patient would have to have this arranged at d/c.  She states she has been talking to a few places. She is just unsure her dad will agree to go.  CM following.

## 2018-06-21 NOTE — TOC Initial Note (Signed)
Transition of Care [facility]) - Initial/Assessment Note    Patient Details  Name: [name] MRN: [id number] Date of Birth: Apr 30, 1965  Transition of Care [facility]) CM/SW Contact:    Kermit Balo, RN Phone Number: 06/21/2018, 11:48 AM  Clinical Narrative:                 Pt is without insurance and no PCP. He lives in [place]. CM was able to find that the [facility] in [place] is open. Pt will have to contact them for an application. After he fills it out they will schedule him for an appointment.  Pt states he has transportation.  Expected Discharge Plan: Home/Self Care Barriers to Discharge: Continued Medical Work up, Inadequate or no insurance   Patient Goals and CMS Choice        Expected Discharge Plan and Services Expected Discharge Plan: Home/Self Care       Living arrangements for the past 2 months: Single Family Home(one level)                                      Prior Living Arrangements/Services Living arrangements for the past 2 months: Single Family Home(one level) Lives with:: Self Patient language and need for interpreter reviewed:: Yes(no needs) Do you feel safe going back to the place where you live?: Yes        Care giver support system in place?: No (comment)(no 24 hour supervision. states father can check in on him) Current home services: DME(walker, cane, shower seat) Criminal Activity/Legal Involvement Pertinent to Current Situation/Hospitalization: No - Comment as needed  Activities of Daily Living      Permission Sought/Granted                  Emotional Assessment Appearance:: Appears stated age Attitude/Demeanor/Rapport: Lethargic Affect (typically observed): Flat Orientation: : Oriented to Self, Oriented to Place, Oriented to  Time, Oriented to Situation Alcohol / Substance Use: Tobacco Use Psych Involvement: No (comment)  Admission diagnosis:  Tachycardia [R00.0] Pain [R52] AKI (acute kidney injury) (HCC)  [N17.9] Sciatica, unspecified laterality [M54.30] Intractable back pain [M54.9] Patient Active Problem List   Diagnosis Date Noted  . AKI (acute kidney injury) (HCC) 06/21/2018  . Spinal stenosis of lumbosacral region 06/21/2018  . Intractable back pain 06/19/2018  . Hepatitis C antibody test positive 12/17/2013  . Elevated BP 12/17/2013  . Smoking 12/17/2013  . Family history of diabetes mellitus (DM) 12/17/2013  . Notalgia 12/17/2013  . Screening 12/17/2013  . Cellulitis 11/23/2013   PCP:  Patient, No Pcp Per Pharmacy:   [facility] - [place], [place] - [address] [place] [address] Phone: [phone] Fax: [phone]  [facility] [address], [place] - [address] [place] [address] [place] [address] [place] [address] Phone: [phone] Fax: [phone]  [facility] - [place], [place] - [address] [place] [address] Phone: [phone] Fax: [phone]     Social Determinants of Health (SDOH) Interventions    Readmission Risk Interventions No flowsheet data found.

## 2018-06-21 NOTE — Evaluation (Signed)
Physical Therapy Treatment Patient Details Name: [name] MRN: [id number] DOB: 06/20/1965 Today's Date: 06/21/2018    History of Present Illness Pt. with PMH of hep C, substance abuse; admitted on 06/19/2018, presented with complaint of back pain, was found to have musculoskeletal back pain as well as right upper extremity cellulitis.    PT Comments    Pt admitted with above diagnosis. Pt currently with functional limitations due to the deficits listed below (see PT Problem List). Due to his pain, poor effort to cooperate, and lethargy if was difficult to get a full mobility assessment at this time. He was overall max A with bed mobility and to sit at EOB. At that point he screamed in pain and refused to cooperate any further with transfers or ambulation. Pt will benefit from skilled PT to increase their independence and safety with mobility to allow likely discharge home. PT feels he can be independent with mobility if he will cooperate.   Follow Up Recommendations  Other (comment)(likely DC home if he can show effort to be independent)     Equipment Recommendations  Rolling walker with 5" wheels       Precautions / Restrictions Precautions Precautions: None Restrictions Weight Bearing Restrictions: No    Mobility  Bed Mobility Overal bed mobility: Needs Assistance Bed Mobility: Supine to Sit;Rolling Rolling: Max assist   Supine to sit: Max assist     General bed mobility comments: suspect poor effort on his behalf and when PT assists he screams in pain however he had already had his pain meds and was requesting more  Transfers                 General transfer comment: Pt refused any further transfers today other than supine to sit. He states he hurts too much but will try again tommorow.  Ambulation/Gait             General Gait Details: Pt unwilling to perform       Balance    Sitting Balance fair, unable to test standing balance due to refusal to  stand            Cognition Arousal/Alertness: Lethargic;Suspect due to medications Behavior During Therapy: Agitated Overall Cognitive Status: Difficult to assess         General Comments: poor effort with PT today         General Comments General comments (skin integrity, edema, etc.): some redness and edema noted in Rt UE, mild edema in Lt knee      Pertinent Vitals/Pain Pain Assessment: 0-10 Pain Score: 9  Pain Location: (Rt arm and back and Lt leg) Pain Descriptors / Indicators: Aching;Discomfort;Pressure Pain Intervention(s): Limited activity within patient's tolerance;Premedicated before session;Patient requesting pain meds-RN notified    Home Living Family/patient expects to be discharged to:: Private residence Living Arrangements: Parent   Type of Home: House Home Access: Level entry   Home Layout: One level   Additional Comments: pt lethargic and PT is unsure of accuracy    Prior Function Level of Independence: Independent      Comments: 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   PT Goals (current goals can now be found in the care plan section) Acute Rehab PT Goals Patient Stated Goal: return home PT Goal Formulation: With patient Time For Goal Achievement: 07/05/18 Potential to Achieve Goals: Good    Frequency    Min 3X/week      PT Plan  AM-PAC PT "6 Clicks" Mobility   Outcome Measure  Help needed turning from your back to your side while in a flat bed without using bedrails?: A Lot Help needed moving from lying on your back to sitting on the side of a flat bed without using bedrails?: A Lot Help needed moving to and from a bed to a chair (including a wheelchair)?: A Lot Help needed standing up from a chair using your arms (e.g., wheelchair or bedside chair)?: A Lot Help needed to walk in hospital room?: A Lot Help needed climbing 3-5 steps with a railing? : A Lot 6 Click Score: 12    End of  Session   Activity Tolerance: Patient limited by lethargy;Patient limited by pain;Treatment limited secondary to agitation Patient left: in bed;with call bell/phone within reach;with bed alarm set Nurse Communication: Mobility status;Patient requests pain meds PT Visit Diagnosis: Unsteadiness on feet (R26.81);Muscle weakness (generalized) (M62.81);Pain;Other abnormalities of gait and mobility (R26.89) Pain - Right/Left: Right Pain - part of body: Arm(and back and Lt leg)     Time: 1110-1135 PT Time Calculation (min) (ACUTE ONLY): 25 min  Charges:  $Therapeutic Activity: 8-22 mins                     Ivery Quale, PT,DPT Acute Rehabilitation Services Office [phone]    April Manson 06/21/2018, 12:55 PM

## 2018-06-21 NOTE — Progress Notes (Addendum)
PROGRESS NOTE    [name]  XIP:[id number] DOB: 10-Feb-1965 DOA: 06/19/2018 PCP: Patient, No Pcp Per   Brief Narrative:  53 year old with history of hepatitis C, substance abuse admitted to the hospital with complaints of back pain and right upper extremity pain and erythema.  MRI of the back showed severe foraminal stenosis L5-S1.  Transferred from any pain hospital to [facility] for neurosurgery evaluation who recommended medical management.  Right upper extremity suspicion for cellulitis therefore on IV Rocephin, inflammatory markers elevated.  Orthopedic recommends conservative management for now.  Right upper extremity Dopplers negative for DVT, arterial Doppler pending.   Assessment & Plan:   Principal Problem:   Cellulitis Active Problems:   Intractable back pain   AKI (acute kidney injury) (HCC)   Spinal stenosis of lumbosacral region  Sepsis secondary to right upper extremity cellulitis without abscess, present on admission, only minimal improvement - Inflammatory markers elevated.  CK levels normal.  Culture data remains negative.  Upper extremity Doppler is negative for acute DVT but shows chronic superficial thrombosis -Continue IV Rocephin - Follow-up arterial Dopplers to rule out any acute blockages -Pain control  Severe lumbar saccular spinal stenosis, L5-L1 - Medical management per neurosurgery.  Pain control.  PT/OT evaluation.  Leukocytosis, worsening -Combination of underlying infection and receiving steroids.  We will continue to monitor this.  Acute kidney injury, likely prerenal in nature, 3.56 at initial presentation - Improved with IV fluids.  Creatinine down to 1.94  GERD -PPI twice daily added  Hypoalbuminemia -Very mild nourishment.  Continue to monitor.  Encourage oral diet  Acute toxic/metabolic encephalopathy, resolved  DVT prophylaxis: Subcutaneous heparin Code Status: Full code Family Communication: None at bedside. Spoke with [name] over  the phone, his daughter [phone] Disposition Plan: His right upper extremity still swollen with limited functionality due to pain.  He would benefit from at least another 24-48 hours of IV antibiotics in the hospital until this improves.  In the meantime awaiting arterial Dopplers and physical therapy evaluation.  Consultants:   Previous provider spoke with neurosurgery and orthopedic-both recommending medical management  Procedures:   None  Antimicrobials:   IV Rocephin day 2   Subjective: Patient still reports of right upper extremity pain and lower extremity bilateral weakness.  He also reports of significant amount of heartburn.  Feels quite weak and not back to himself yet.  Review of Systems Otherwise negative except as per HPI, including: General: Denies fever, chills, night sweats or unintended weight loss. Resp: Denies cough, wheezing, shortness of breath. Cardiac: Denies chest pain, palpitations, orthopnea, paroxysmal nocturnal dyspnea. GI: Denies abdominal pain, nausea, vomiting, diarrhea or constipation GU: Denies dysuria, frequency, hesitancy or incontinence MS: Right upper extremity pain and swelling Neuro: Bilateral lower extremity weakness Psych: Denies anxiety, depression, SI/HI/AVH Skin: Denies new rashes or lesions ID: Denies sick contacts, exotic exposures, travel  Objective: Vitals:   06/20/18 1511 06/20/18 2034 06/21/18 0032 06/21/18 0428  BP: (!) 137/93 124/78 (!) 143/87 127/80  Pulse: (!) 114 (!) 106 98 96  Resp: 17 19 16 17   Temp: 99 F (37.2 C) 98.6 F (37 C) 98.8 F (37.1 C) 98.7 F (37.1 C)  TempSrc: Oral Oral Oral Oral  SpO2: 93% 94% 95% 94%  Weight:      Height:        Intake/Output Summary (Last 24 hours) at 06/21/2018 0740 Last data filed at 06/21/2018 0700 Gross per 24 hour  Intake 1200 ml  Output 3210 ml  Net -  2010 ml   Filed Weights   06/19/18 1303  Weight: 74.8 kg    Examination:  General exam: Appears calm and  comfortable  Respiratory system: Clear to auscultation. Respiratory effort normal. Cardiovascular system: S1 & S2 heard, RRR. No JVD, murmurs, rubs, gallops or clicks. No pedal edema. Gastrointestinal system: Abdomen is nondistended, soft and nontender. No organomegaly or masses felt. Normal bowel sounds heard. Central nervous system: Alert and oriented. No focal neurological deficits. Extremities: Bilateral lower extremity strength 4/5, bilateral upper extremity strength 5/5 Skin: Right upper extremity erythema and swelling noted extending from the shoulder to his hand.  Warm to touch. Psychiatry: Judgement and insight appear normal. Mood & affect appropriate.     Data Reviewed:   CBC: Recent Labs  Lab 06/19/18 1447 06/20/18 0445 06/21/18 0340  WBC 18.9* 24.0* 30.2*  NEUTROABS  --   --  29.0*  HGB 12.6* 11.6* 9.8*  HCT 34.5* 31.9* 27.4*  MCV 86.5 85.8 88.1  PLT 299 297 307   Basic Metabolic Panel: Recent Labs  Lab 06/19/18 1447 06/20/18 0445 06/20/18 0912 06/21/18 0340  NA 122* 125* 127* 131*  K 3.3* 3.9 3.9 4.0  CL 85* 86* 94* 101  CO2 22 19* 21* 21*  GLUCOSE 153* 142* 140* 138*  BUN 120* 116* 109* 86*  CREATININE 3.56* 2.76* 2.55* 1.94*  CALCIUM 7.4* 7.7* 7.7* 7.7*  MG 2.8*  --  2.6* 2.7*  PHOS  --   --  4.0  --    GFR: Estimated Creatinine Clearance: 45.5 mL/min (A) (by C-G formula based on SCr of 1.94 mg/dL (H)). Liver Function Tests: Recent Labs  Lab 06/20/18 0912 06/21/18 0340  AST 58* 43*  ALT 49* 37  ALKPHOS 82 80  BILITOT 1.1 0.9  PROT 6.0* 5.7*  ALBUMIN 1.3* 1.1*   No results for input(s): LIPASE, AMYLASE in the last 168 hours. No results for input(s): AMMONIA in the last 168 hours. Coagulation Profile: No results for input(s): INR, PROTIME in the last 168 hours. Cardiac Enzymes: Recent Labs  Lab 06/20/18 0912 06/21/18 0340  CKTOTAL 103 85   BNP (last 3 results) No results for input(s): PROBNP in the last 8760 hours. HbA1C: No results  for input(s): HGBA1C in the last 72 hours. CBG: No results for input(s): GLUCAP in the last 168 hours. Lipid Profile: No results for input(s): CHOL, HDL, LDLCALC, TRIG, CHOLHDL, LDLDIRECT in the last 72 hours. Thyroid Function Tests: Recent Labs    06/19/18 1447  TSH 0.959   Anemia Panel: No results for input(s): VITAMINB12, FOLATE, FERRITIN, TIBC, IRON, RETICCTPCT in the last 72 hours. Sepsis Labs: Recent Labs  Lab 06/20/18 0912  LATICACIDVEN 1.4    Recent Results (from the past 240 hour(s))  SARS Coronavirus 2 [facility] order, Performed in [facility] hospital lab)     Status: None   Collection Time: 06/19/18  2:12 PM  Result Value Ref Range Status   SARS Coronavirus 2 NEGATIVE NEGATIVE Final    Comment: (NOTE) If result is NEGATIVE SARS-CoV-2 target nucleic acids are NOT DETECTED. The SARS-CoV-2 RNA is generally detectable in upper and lower  respiratory specimens during the acute phase of infection. The lowest  concentration of SARS-CoV-2 viral copies this assay can detect is 250  copies / mL. A negative result does not preclude SARS-CoV-2 infection  and should not be used as the sole basis for treatment or other  patient management decisions.  A negative result may occur with  improper specimen collection /  handling, submission of specimen other  than nasopharyngeal swab, presence of viral mutation(s) within the  areas targeted by this assay, and inadequate number of viral copies  (<250 copies / mL). A negative result must be combined with clinical  observations, patient history, and epidemiological information. If result is POSITIVE SARS-CoV-2 target nucleic acids are DETECTED. The SARS-CoV-2 RNA is generally detectable in upper and lower  respiratory specimens dur ing the acute phase of infection.  Positive  results are indicative of active infection with SARS-CoV-2.  Clinical  correlation with patient history and other diagnostic information is  necessary to  determine patient infection status.  Positive results do  not rule out bacterial infection or co-infection with other viruses. If result is PRESUMPTIVE POSTIVE SARS-CoV-2 nucleic acids MAY BE PRESENT.   A presumptive positive result was obtained on the submitted specimen  and confirmed on repeat testing.  While 2019 novel coronavirus  (SARS-CoV-2) nucleic acids may be present in the submitted sample  additional confirmatory testing may be necessary for epidemiological  and / or clinical management purposes  to differentiate between  SARS-CoV-2 and other Sarbecovirus currently known to infect humans.  If clinically indicated additional testing with an alternate test  methodology [phone]([id number]) is advised. The SARS-CoV-2 RNA is generally  detectable in upper and lower respiratory sp ecimens during the acute  phase of infection. The expected result is Negative. Fact Sheet for Patients:  BoilerBrush.com.cyhttps://www.fda.gov/media/136312/download Fact Sheet for Healthcare Providers: https://pope.com/https://www.fda.gov/media/136313/download This test is not yet approved or cleared by the [place] FDA and has been authorized for detection and/or diagnosis of SARS-CoV-2 by FDA under an Emergency Use Authorization (EUA).  This EUA will remain in effect (meaning this test can be used) for the duration of the COVID-19 declaration under Section 564(b)(1) of the Act, 21 U.S.C. section 360bbb-3(b)(1), unless the authorization is terminated or revoked sooner. Performed at [facility], [address] [place], [place] [address]          Radiology Studies: Dg Shoulder Right  Result Date: 06/19/2018 CLINICAL DATA:  Right shoulder pain for 3 days.  No reported injury. EXAM: RIGHT SHOULDER - 2+ VIEW COMPARISON:  None. FINDINGS: There is no evidence of fracture or dislocation. There is no evidence of arthropathy or other focal bone abnormality. Soft tissues are unremarkable. IMPRESSION: No acute osseous abnormality.  No  malalignment. Electronically Signed   By: Delbert PhenixJason A Poff M.D.   On: 06/19/2018 16:50   Dg Forearm Right  Result Date: 06/20/2018 CLINICAL DATA:  Pain and swelling EXAM: RIGHT FOREARM - 2 VIEW COMPARISON:  None. FINDINGS: No bone or joint finding. Possible soft tissue swelling of the forearm. IMPRESSION: No bone or joint finding. Electronically Signed   By: Paulina FusiMark  Shogry M.D.   On: 06/20/2018 14:05   Dg Wrist Complete Right  Result Date: 06/20/2018 CLINICAL DATA:  Pain and swelling. EXAM: RIGHT WRIST - COMPLETE 3+ VIEW COMPARISON:  None. FINDINGS: There is no evidence of fracture or dislocation. There is no evidence of arthropathy or other focal bone abnormality. Soft tissues are unremarkable. IMPRESSION: Negative. Electronically Signed   By: Paulina FusiMark  Shogry M.D.   On: 06/20/2018 14:06   [place] Renal  Result Date: 06/20/2018 CLINICAL DATA:  Acute kidney injury. EXAM: RENAL / URINARY TRACT ULTRASOUND COMPLETE COMPARISON:  None. FINDINGS: Right Kidney: Renal measurements: 12.5 x 5.2 x 7.8 cm = volume: 266 mL. Increased echogenicity of the renal parenchyma. No hydronephrosis or focal lesion. Left Kidney: Renal measurements: 13.2 x 6.5 x 6.3  cm = volume: 281 mL. Echogenicity within normal limits. No mass or hydronephrosis visualized. Bladder: Increased echogenicity of the renal parenchyma. No hydronephrosis or focal lesion. Appears normal for degree of bladder distention. IMPRESSION: Borderline enlarged echogenic kidneys as might be seen with acute kidney injury or nephritis. No evidence of obstruction, atrophy or focal lesion. Electronically Signed   By: Paulina Fusi M.D.   On: 06/20/2018 10:08   Dg Chest Portable 1 View  Result Date: 06/19/2018 CLINICAL DATA:  Severe back pain EXAM: PORTABLE CHEST 1 VIEW COMPARISON:  None. FINDINGS: Cardiomegaly. Both lungs are clear. The visualized skeletal structures are unremarkable. IMPRESSION: Cardiomegaly without acute abnormality of the lungs in AP portable projection.  Electronically Signed   By: Lauralyn Primes M.D.   On: 06/19/2018 15:44   Dg Humerus Right  Result Date: 06/20/2018 CLINICAL DATA:  Pain and swelling EXAM: RIGHT HUMERUS - 2+ VIEW COMPARISON:  None. FINDINGS: There is no evidence of fracture or other focal bone lesions. Soft tissues are unremarkable. IMPRESSION: Negative. Electronically Signed   By: Paulina Fusi M.D.   On: 06/20/2018 14:06   Vas [place] Upper Extremity Venous Duplex  Result Date: 06/20/2018 UPPER VENOUS STUDY  Indications: Swelling Performing Technologist: Jeb Levering RDMS, RVT  Examination Guidelines: A complete evaluation includes B-mode imaging, spectral Doppler, color Doppler, and power Doppler as needed of all accessible portions of each vessel. Bilateral testing is considered an integral part of a complete examination. Limited examinations for reoccurring indications may be performed as noted.  Right Findings: +----------+------------+---------+-----------+----------+-------+  RIGHT      Compressible Phasicity Spontaneous Properties Summary  +----------+------------+---------+-----------+----------+-------+  IJV            Full        Yes        Yes                         +----------+------------+---------+-----------+----------+-------+  Subclavian     Full        Yes        Yes                         +----------+------------+---------+-----------+----------+-------+  Axillary       Full        Yes        Yes                         +----------+------------+---------+-----------+----------+-------+  Brachial       Full        Yes        Yes                         +----------+------------+---------+-----------+----------+-------+  Radial         Full                                               +----------+------------+---------+-----------+----------+-------+  Ulnar          Full                                               +----------+------------+---------+-----------+----------+-------+  Cephalic  Partial                                      Chronic  +----------+------------+---------+-----------+----------+-------+  Basilic        Full                                               +----------+------------+---------+-----------+----------+-------+  Left Findings: +----------+------------+---------+-----------+----------+-------+  LEFT       Compressible Phasicity Spontaneous Properties Summary  +----------+------------+---------+-----------+----------+-------+  Subclavian                 Yes        Yes                         +----------+------------+---------+-----------+----------+-------+  Summary:  Right: No evidence of deep vein thrombosis in the upper extremity. Findings consistent with chronic superficial vein thrombosis involving the right cephalic vein, isolated area at antecubital fossa.  Left: No evidence of thrombosis in the subclavian.  *See table(s) above for measurements and observations.  Diagnosing physician: Waverly Ferrari MD Electronically signed by Waverly Ferrari MD on 06/20/2018 at 7:01:43 PM.    Final         Scheduled Meds:  folic acid  1 mg Oral Daily   methocarbamol  500 mg Oral TID   multivitamin with minerals  1 tablet Oral Daily   thiamine  100 mg Oral Daily   Or   thiamine  100 mg Intravenous Daily   Continuous Infusions:  sodium chloride 100 mL/hr at 06/21/18 0455   cefTRIAXone (ROCEPHIN)  IV 1 g (06/20/18 0850)     LOS: 1 day   Time spent= 25 mins    Ebubechukwu Jedlicka Joline Maxcy, MD [facility]  If 7PM-7AM, please contact night-coverage [email or web address] 06/21/2018, 7:40 AM

## 2018-06-21 NOTE — Evaluation (Signed)
Occupational Therapy Evaluation Patient Details Name: [name] MRN: [id number] DOB: [birth year] Today's Date: 06/21/2018    History of Present Illness Pt. with PMH of hep C, substance abuse; admitted on 06/19/2018, presented with complaint of back pain, was found to have musculoskeletal back pain as well as right upper extremity cellulitis.   Clinical Impression   PTA: pt living with his parent and working in [employer]; moving a Building services engineer, pt tweaked his back and now having sciatic nerve pain with generalized weakness all over. Pt minimally moving BLEs toward EOB; teaching leg lift trunk with blanket and pt able to make slow movements. Pt performing rolling to L side and had to stop after this it "took his breath away." Pt encouraged to try again and move to EOB, but pt denying need due to severe pain. RN alerted of pain. Pt  Limited by decreased ROM at baseline in BUE shoulders and weakness with poor coordination. Pt not tolerating session well. Pt greatly requires continued OT skilled services for ADL, mobility and safety in post acute care setting. Pt limited by no insurance. OT to progress as tolerated acutely.        Follow Up Recommendations  Supervision - Intermittent(Pt limited by no insurance and needs to show progress)    Equipment Recommendations  Other (comment)(to be determined)    Recommendations for Other Services Rehab consult     Precautions / Restrictions Precautions Precautions: None Restrictions Weight Bearing Restrictions: No      Mobility Bed Mobility Overal bed mobility: Needs Assistance Bed Mobility: Supine to Sit;Rolling Rolling: Max assist         General bed mobility comments: Rolling over took my breath away  Transfers                 General transfer comment: Pt refused any further transfers today other than supine to sit. He states he hurts too much but will try again tommorow.    Balance Overall balance assessment:  (unable to test)                                         ADL either performed or assessed with clinical judgement   ADL Overall ADL's : Needs assistance/impaired Eating/Feeding: Set up;Bed level   Grooming: Moderate assistance;Bed level   Upper Body Bathing: Moderate assistance;Bed level   Lower Body Bathing: Total assistance;Bed level   Upper Body Dressing : Moderate assistance;Bed level   Lower Body Dressing: Total assistance;Bed level   Toilet Transfer: Total assistance;Grab bars   Toileting- Clothing Manipulation and Hygiene: Total assistance;Bed level       Functional mobility during ADLs: Total assistance General ADL Comments: Pt requires modA for UB ADL and totalA LB ADL     Vision Baseline Vision/History: No visual deficits Patient Visual Report: No change from baseline Vision Assessment?: No apparent visual deficits     Perception     Praxis      Pertinent Vitals/Pain Pain Assessment: 0-10 Pain Score: 7  Pain Descriptors / Indicators: Aching;Discomfort;Pressure Pain Intervention(s): Limited activity within patient's tolerance;Monitored during session     Hand Dominance Right   Extremity/Trunk Assessment Upper Extremity Assessment Upper Extremity Assessment: Generalized weakness RUE Deficits / Details: 2/5 MM grade elbow through digits; shoulder 2-/5MM grade LUE Deficits / Details: 2/5 MM grade elbow through digits; shoulder 2-/5MM grade   Lower Extremity Assessment Lower Extremity Assessment: Generalized  weakness       Communication Communication Communication: No difficulties   Cognition Arousal/Alertness: Awake/alert Behavior During Therapy: WFL for tasks assessed/performed Overall Cognitive Status: Within Functional Limits for tasks assessed                                     General Comments  redness in RUE, swelling present     Exercises     Shoulder Instructions      Home Living Family/patient  expects to be discharged to:: Private residence Living Arrangements: Parent Available Help at Discharge: Available PRN/intermittently Type of Home: House Home Access: Level entry     Home Layout: One level     Bathroom Shower/Tub: Producer, television/film/videoWalk-in shower   Bathroom Toilet: Handicapped height     Home Equipment: Cane - single point   Additional Comments: pt awake/alert      Prior Functioning/Environment Level of Independence: Independent        Comments: 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        OT Problem List: Decreased strength;Decreased range of motion;Decreased activity tolerance;Impaired balance (sitting and/or standing);Decreased safety awareness;Decreased coordination;Impaired UE functional use;Pain;Increased edema      OT Treatment/Interventions: Self-care/ADL training;Therapeutic exercise;Neuromuscular education;Energy conservation;DME and/or AE instruction;Therapeutic activities;Patient/family education;Balance training    OT Goals(Current goals can be found in the care plan section) Acute Rehab OT Goals Patient Stated Goal: return home OT Goal Formulation: With patient Time For Goal Achievement: 07/05/18 Potential to Achieve Goals: Good ADL Goals Pt Will Perform Grooming: with modified independence;sitting Pt Will Perform Upper Body Dressing: with modified independence;sitting Pt Will Perform Lower Body Dressing: with modified independence;sitting/lateral leans;sit to/from stand Pt Will Transfer to Toilet: with min guard assist;ambulating;bedside commode Pt/caregiver will Perform Home Exercise Program: Both right and left upper extremity;With Supervision;With written HEP provided Additional ADL Goal #1: Pt will sit EOB for ADL tasks with set-upA and fair sitting balance  OT Frequency: Min 2X/week   Barriers to D/C: Decreased caregiver support          Co-evaluation              AM-PAC OT "6 Clicks" Daily Activity      Outcome Measure Help from another person eating meals?: A Little Help from another person taking care of personal grooming?: A Lot Help from another person toileting, which includes using toliet, bedpan, or urinal?: Total Help from another person bathing (including washing, rinsing, drying)?: A Lot Help from another person to put on and taking off regular upper body clothing?: A Lot Help from another person to put on and taking off regular lower body clothing?: Total 6 Click Score: 11   End of Session Nurse Communication: Mobility status;Patient requests pain meds  Activity Tolerance: Patient limited by pain;Treatment limited secondary to agitation Patient left: in bed;with call bell/phone within reach;with bed alarm set  OT Visit Diagnosis: Muscle weakness (generalized) (M62.81);Pain Pain - Right/Left: Right Pain - part of body: Arm                Time: [id number] OT Time Calculation (min): 35 min Charges:  OT General Charges $OT Visit: 1 Visit OT Evaluation $OT Eval Moderate Complexity: 1 Mod OT Treatments $Therapeutic Activity: 8-22 mins  Revonda StandardAllison Cecil Cranker(Jelenek) [place] OTR/L Acute Rehabilitation Services Pager: [phone] Office: [phone]   Gunnar FusiLLISON J Chilton Sallade 06/21/2018, 5:00 PM

## 2018-06-21 NOTE — Progress Notes (Signed)
RUE arterial duplex       has been completed. Preliminary results can be found under CV proc through chart review. Coleta Grosshans, BS, RDMS, RVT   

## 2018-06-22 DIAGNOSIS — K219 Gastro-esophageal reflux disease without esophagitis: Secondary | ICD-10-CM

## 2018-06-22 DIAGNOSIS — E8809 Other disorders of plasma-protein metabolism, not elsewhere classified: Secondary | ICD-10-CM

## 2018-06-22 DIAGNOSIS — R636 Underweight: Secondary | ICD-10-CM | POA: Insufficient documentation

## 2018-06-22 DIAGNOSIS — E441 Mild protein-calorie malnutrition: Secondary | ICD-10-CM | POA: Diagnosis present

## 2018-06-22 DIAGNOSIS — E871 Hypo-osmolality and hyponatremia: Secondary | ICD-10-CM | POA: Diagnosis present

## 2018-06-22 DIAGNOSIS — L0390 Cellulitis, unspecified: Secondary | ICD-10-CM

## 2018-06-22 DIAGNOSIS — R768 Other specified abnormal immunological findings in serum: Secondary | ICD-10-CM

## 2018-06-22 HISTORY — DX: Gastro-esophageal reflux disease without esophagitis: K21.9

## 2018-06-22 LAB — SYNOVIAL CELL COUNT + DIFF, W/ CRYSTALS
Crystals, Fluid: NONE SEEN
Eosinophils-Synovial: 0 % (ref 0–1)
Lymphocytes-Synovial Fld: 3 % (ref 0–20)
Monocyte-Macrophage-Synovial Fluid: 16 % — ABNORMAL LOW (ref 50–90)
Neutrophil, Synovial: 81 % — ABNORMAL HIGH (ref 0–25)
WBC, Synovial: 19100 /mm3 — ABNORMAL HIGH (ref 0–200)

## 2018-06-22 MED ORDER — BUPIVACAINE HCL (PF) 0.5 % IJ SOLN
10.0000 mL | Freq: Once | INTRAMUSCULAR | Status: DC
  Filled 2018-06-22: qty 10

## 2018-06-22 MED ORDER — METHYLPREDNISOLONE ACETATE 80 MG/ML IJ SUSP
80.0000 mg | Freq: Once | INTRAMUSCULAR | Status: DC
  Filled 2018-06-22: qty 1

## 2018-06-22 NOTE — Procedures (Signed)
Procedure: Left knee aspiration and injection  Indication: Left knee effusion(s)  Surgeon: Charma Igo, PA-C  Assist: None  Anesthesia: None  EBL: None  Complications: None  Findings: After risks/benefits explained patient desires to undergo procedure. Consent obtained and time out performed. The left knee was sterilely prepped and aspirated. turbid yellow fluid obtained. Samples sent for GS, C&S, and cell count. 45ml Marcaine instilled into joint. Pt tolerated the procedure well.    Freeman Caldron, PA-C Orthopedic Surgery [phone]

## 2018-06-22 NOTE — TOC Progression Note (Signed)
Transition of Care [facility]) - Progression Note    Patient Details  Name: [name] MRN: [id number] Date of Birth: 29-Sep-1965  Transition of Care [facility]) CM/SW [place], [place] Phone Number: 06/22/2018, 11:49 AM  Clinical Narrative:  CSW met with patient to discuss substance abuse resources. CSW explained to patient that his addiction was negatively impacting his health and he should get some help. CSW asked patient if he would be interested in going over some resources, and he said "sure". CSW provided information on [facility] in [place] providing telehealth appointments if he calls and schedules. CSW also provided information for patient on AA meetings within [facility] that are being conducted over [place] during the pandemic lockdown. CSW confirmed patient has a computer and the ability to login to meetings if he chose to, and he nodded. Patient appreciated information and said he would follow up on his return home.      Expected Discharge Plan: Home/Self Care Barriers to Discharge: Continued Medical Work up, Inadequate or no insurance  Expected Discharge Plan and Services Expected Discharge Plan: Home/Self Care       Living arrangements for the past 2 months: Single Family Home(one level)                                       Social Determinants of Health (SDOH) Interventions    Readmission Risk Interventions No flowsheet data found.

## 2018-06-22 NOTE — Progress Notes (Addendum)
Progress Note    [name]  VOP:[id number] DOB: 06-14-1965  DOA: 06/19/2018 PCP: Patient, No Pcp Per    Brief Narrative:   Chief complaint: Follow-up back pain and right upper extremity cellulitis  Medical records reviewed and are as summarized below:  [name] is an 53 y.o. male with a PMH of hepatitis C and substance abuse who was admitted 06/18/2017 with a chief complaint of back pain and right upper extremity cellulitis.  Assessment/Plan:   Principal Problem:   Cellulitis of the left upper extremity/sepsis complicated by superficial thrombosis and acute toxic and metabolic encephalopathy Patient's inflammatory markers were elevated on admission, but culture data has remained negative.  His toxic/metabolic encephalopathy has resolved.  Right upper extremity Doppler was negative for DVT but did show chronic superficial thrombosis.  Arterial duplex also performed with no obstruction identified. The patient has been managed with IV Rocephin.  Active Problems:   Hypokalemia Resolved.    Hyponatremia Resolving with IVF. Continue IVF.    Alcohol/polysubstance abuse Remains on Ativan per CIWA protocol and supplemental thiamine/folic acid.  Known h/o hepatitis C. Daughter hoping for rehab. Remains tachycardic.CIWA scores have been 3-6 over the past 24 hours.    Persistent leukocytosis in the setting of polyarthralgias and a left knee effusion Seems a bit out of proportion to two doses of Solu-Medrol. Steadily rising and was up to 30.2 yesterday.  I am a bit concerned about his polyarthralgia and new complaints of left knee pain and swelling.  Yesterday he tells me he could not bear weight and was having pain in his right knee.  Examination of his left knee reveals a sizable effusion but no overlying erythema. Nonetheless I am concerned he has a septic joint given his marked elevation of ESR and CRP checked on 06/19/2017 in the setting of persistent leukocytosis.   Patient tells  me he has been unable to bear weight over the past 24 hours.  He has been seen and evaluated by both PT and OT.  Discussed his case with the orthopedic surgeon on call who will see him in consultation and perform an knee arthrocentesis.  Will send any synovial fluid obtained for Gram stain and culture.    Intractable back pain secondary to severe lumbosacral spinal stenosis  MRI completed on 06/13/18, which showed chronic bilateral L5 pars interarticularis defects result in 0.7 cm anterolisthesis L5 on S1. Moderately severe to severe foraminal narrowing at this level due to anterolisthesis and disc appears worse on the left." Being managed with Robaxin and morphine Q 4 hours PRN. PT/OT evaluations performed. Has been unable to fully participate due to complaints of severe pain. Has been using morphine around 3 times a day and Robaxin around 3 times a day.  Also received Solu-medrol on 06/19/18 & 06/20/18.  Will need to transition to oral pain medications prior to discharge.    AKI (acute kidney injury) ([place]) Thought to be prerenal in etiology with an initial creatinine of 3.56.  Steadily improving with IV fluids.  Creatinine 1.94 on last check. Remains on IVF at 100 cc/hr.    GERD Continue twice daily PPI.    Mild protein calorie malnutrition/hypoalbuminemia/underweight Body mass index is 23.68 kg/m.   Family Communication/Anticipated D/C date and plan/Code Status   DVT prophylaxis: SCDs ordered. Code Status: Full Code.  Family Communication: Daughter updated by telephone [name], phone number [phone]) Disposition Plan: Patient will likely be discharged home once his white blood cell count begins to show signs  of improvement and he has been fully worked up to rule out septic arthritis or another etiology for his persistent leukocytosis.   Medical Consultants:    Orthopedic Surgery  Anti-Infectives:    Rocephin 06/20/18--->   Subjective:   Complains of left knee swelling and  worsening pain.  He tells me he was unable to ambulate yesterday.  Oral intake remains poor.  No reports of shortness of breath or cough.  Objective:    Vitals:   06/21/18 2030 06/21/18 2354 06/22/18 0417 06/22/18 0730  BP: 135/84 135/88 134/83 128/82  Pulse: (!) 106 (!) 108 (!) 104 (!) 106  Resp: '15 19 16   ' Temp: 98.7 F (37.1 C) 98.6 F (37 C) 97.7 F (36.5 C) 98.3 F (36.8 C)  TempSrc: Oral Oral Oral Oral  SpO2: 96% 96% 98% 98%  Weight:      Height:        Intake/Output Summary (Last 24 hours) at 06/22/2018 0739 Last data filed at 06/22/2018 0730 Gross per 24 hour  Intake 4073.48 ml  Output 3115 ml  Net 958.48 ml   Filed Weights   06/19/18 1303  Weight: 74.8 kg    Exam: General: No acute distress.  Appears older than stated age. Cardiovascular: Heart sounds show a regular rate, and rhythm. No gallops or rubs. No murmurs. No JVD. Lungs: Clear to auscultation bilaterally with good air movement. No rales, rhonchi or wheezes. Abdomen: Soft, nontender, nondistended with normal active bowel sounds. No masses. No hepatosplenomegaly. Neurological: Mildly lethargic but awakens to voice. Skin: Warm and dry. No rashes or lesions.  Left knee effusion is present. Extremities: No clubbing or cyanosis. No edema. Pedal pulses 2+. Psychiatric: Mood and affect are depressed. Insight and judgment are impaired.   Data Reviewed:   I have personally reviewed following labs and imaging studies:  Labs: Labs show the following:   Basic Metabolic Panel: Recent Labs  Lab 06/19/18 1447 06/20/18 0445 06/20/18 0912 06/21/18 0340  NA 122* 125* 127* 131*  K 3.3* 3.9 3.9 4.0  CL 85* 86* 94* 101  CO2 22 19* 21* 21*  GLUCOSE 153* 142* 140* 138*  BUN 120* 116* 109* 86*  CREATININE 3.56* 2.76* 2.55* 1.94*  CALCIUM 7.4* 7.7* 7.7* 7.7*  MG 2.8*  --  2.6* 2.7*  PHOS  --   --  4.0  --    GFR Estimated Creatinine Clearance: 45.5 mL/min (A) (by C-G formula based on SCr of 1.94 mg/dL (H)).  Liver Function Tests: Recent Labs  Lab 06/20/18 0912 06/21/18 0340  AST 58* 43*  ALT 49* 37  ALKPHOS 82 80  BILITOT 1.1 0.9  PROT 6.0* 5.7*  ALBUMIN 1.3* 1.1*    CBC: Recent Labs  Lab 06/19/18 1447 06/20/18 0445 06/21/18 0340  WBC 18.9* 24.0* 30.2*  NEUTROABS  --   --  29.0*  HGB 12.6* 11.6* 9.8*  HCT 34.5* 31.9* 27.4*  MCV 86.5 85.8 88.1  PLT 299 297 307   Cardiac Enzymes: Recent Labs  Lab 06/20/18 0912 06/21/18 0340  CKTOTAL 103 85   Thyroid function studies: Recent Labs    06/19/18 1447  TSH 0.959   Sepsis Labs: Recent Labs  Lab 06/19/18 1447 06/20/18 0445 06/20/18 0912 06/21/18 0340  WBC 18.9* 24.0*  --  30.2*  LATICACIDVEN  --   --  1.4  --     Microbiology Recent Results (from the past 240 hour(s))  SARS Coronavirus 2 [facility] order, Performed in [facility] hospital lab)  Status: None   Collection Time: 06/19/18  2:12 PM  Result Value Ref Range Status   SARS Coronavirus 2 NEGATIVE NEGATIVE Final    Comment: (NOTE) If result is NEGATIVE SARS-CoV-2 target nucleic acids are NOT DETECTED. The SARS-CoV-2 RNA is generally detectable in upper and lower  respiratory specimens during the acute phase of infection. The lowest  concentration of SARS-CoV-2 viral copies this assay can detect is 250  copies / mL. A negative result does not preclude SARS-CoV-2 infection  and should not be used as the sole basis for treatment or other  patient management decisions.  A negative result may occur with  improper specimen collection / handling, submission of specimen other  than nasopharyngeal swab, presence of viral mutation(s) within the  areas targeted by this assay, and inadequate number of viral copies  (<250 copies / mL). A negative result must be combined with clinical  observations, patient history, and epidemiological information. If result is POSITIVE SARS-CoV-2 target nucleic acids are DETECTED. The SARS-CoV-2 RNA is generally detectable in  upper and lower  respiratory specimens dur ing the acute phase of infection.  Positive  results are indicative of active infection with SARS-CoV-2.  Clinical  correlation with patient history and other diagnostic information is  necessary to determine patient infection status.  Positive results do  not rule out bacterial infection or co-infection with other viruses. If result is PRESUMPTIVE POSTIVE SARS-CoV-2 nucleic acids MAY BE PRESENT.   A presumptive positive result was obtained on the submitted specimen  and confirmed on repeat testing.  While 2019 novel coronavirus  (SARS-CoV-2) nucleic acids may be present in the submitted sample  additional confirmatory testing may be necessary for epidemiological  and / or clinical management purposes  to differentiate between  SARS-CoV-2 and other Sarbecovirus currently known to infect humans.  If clinically indicated additional testing with an alternate test  methodology [phone]) is advised. The SARS-CoV-2 RNA is generally  detectable in upper and lower respiratory sp ecimens during the acute  phase of infection. The expected result is Negative. Fact Sheet for Patients:  StrictlyIdeas.no Fact Sheet for Healthcare Providers: BankingDealers.co.za This test is not yet approved or cleared by the [place] FDA and has been authorized for detection and/or diagnosis of SARS-CoV-2 by FDA under an Emergency Use Authorization (EUA).  This EUA will remain in effect (meaning this test can be used) for the duration of the COVID-19 declaration under Section 564(b)(1) of the Act, 21 U.S.C. section 360bbb-3(b)(1), unless the authorization is terminated or revoked sooner. Performed at [facility], [address] [place], [place] [address]   Culture, blood (routine x 2)     Status: None (Preliminary result)   Collection Time: 06/20/18  9:00 AM  Result Value Ref Range Status   Specimen Description BLOOD  RIGHT HAND  Final   Special Requests   Final    BOTTLES DRAWN AEROBIC ONLY Blood Culture results may not be optimal due to an inadequate volume of blood received in culture bottles   Culture   Final    NO GROWTH 1 DAY Performed at [facility], [place] [address], [place], [place] [address]    Report Status PENDING  Incomplete  Culture, blood (routine x 2)     Status: None (Preliminary result)   Collection Time: 06/20/18  9:10 AM  Result Value Ref Range Status   Specimen Description BLOOD LEFT HAND  Final   Special Requests   Final    BOTTLES DRAWN  AEROBIC ONLY Blood Culture adequate volume   Culture   Final    NO GROWTH 1 DAY Performed at [facility], [place] [address], [place], [place] [address]    Report Status PENDING  Incomplete    Procedures and diagnostic studies:  Dg Forearm Right  Result Date: 06/20/2018 CLINICAL DATA:  Pain and swelling EXAM: RIGHT FOREARM - 2 VIEW COMPARISON:  None. FINDINGS: No bone or joint finding. Possible soft tissue swelling of the forearm. IMPRESSION: No bone or joint finding. Electronically Signed   By: Nelson Chimes M.D.   On: 06/20/2018 14:05   Dg Wrist Complete Right  Result Date: 06/20/2018 CLINICAL DATA:  Pain and swelling. EXAM: RIGHT WRIST - COMPLETE 3+ VIEW COMPARISON:  None. FINDINGS: There is no evidence of fracture or dislocation. There is no evidence of arthropathy or other focal bone abnormality. Soft tissues are unremarkable. IMPRESSION: Negative. Electronically Signed   By: Nelson Chimes M.D.   On: 06/20/2018 14:06   US Renal  Result Date: 06/20/2018 CLINICAL DATA:  Acute kidney injury. EXAM: RENAL / URINARY TRACT ULTRASOUND COMPLETE COMPARISON:  None. FINDINGS: Right Kidney: Renal measurements: 12.5 x 5.2 x 7.8 cm = volume: 266 mL. Increased echogenicity of the renal parenchyma. No hydronephrosis or focal lesion. Left Kidney: Renal measurements: 13.2 x 6.5 x 6.3 cm = volume: 281 mL. Echogenicity within normal limits. No mass or  hydronephrosis visualized. Bladder: Increased echogenicity of the renal parenchyma. No hydronephrosis or focal lesion. Appears normal for degree of bladder distention. IMPRESSION: Borderline enlarged echogenic kidneys as might be seen with acute kidney injury or nephritis. No evidence of obstruction, atrophy or focal lesion. Electronically Signed   By: Nelson Chimes M.D.   On: 06/20/2018 10:08   Dg Humerus Right  Result Date: 06/20/2018 CLINICAL DATA:  Pain and swelling EXAM: RIGHT HUMERUS - 2+ VIEW COMPARISON:  None. FINDINGS: There is no evidence of fracture or other focal bone lesions. Soft tissues are unremarkable. IMPRESSION: Negative. Electronically Signed   By: Nelson Chimes M.D.   On: 06/20/2018 14:06   Vas [place] Upper Extremity Arterial Duplex  Result Date: 06/21/2018 UPPER EXTREMITY ARTERIAL DUPLEX STUDY Indications: Right arm swelling, pain, cellulitis.  Performing Technologist: June Leap RDMS, RVT  Examination Guidelines: A complete evaluation includes B-mode imaging, spectral Doppler, color Doppler, and power Doppler as needed of all accessible portions of each vessel. Bilateral testing is considered an integral part of a complete examination. Limited examinations for reoccurring indications may be performed as noted.  Right Doppler Findings: +-----------+----------+---------+------+--------+ Site       PSV (cm/s)Waveform PlaqueComments +-----------+----------+---------+------+--------+ Subclavian 96        triphasic               +-----------+----------+---------+------+--------+ Axillary   118       triphasic               +-----------+----------+---------+------+--------+ Brachial   111       triphasic               +-----------+----------+---------+------+--------+ Radial     123       triphasic               +-----------+----------+---------+------+--------+ Ulnar      97        triphasic               +-----------+----------+---------+------+--------+  Palmar Arch87        triphasic               +-----------+----------+---------+------+--------+  Left Doppler Findings: +----------+----------+---------+------+--------+ Site      PSV (cm/s)Waveform PlaqueComments +----------+----------+---------+------+--------+ Subclavian86        triphasic               +----------+----------+---------+------+--------+  Summary:  Right: No arterial obstruction visualized in the right upper        extremity. *See table(s) above for measurements and observations. Electronically signed by Harold Barban MD on 06/21/2018 at 3:33:44 PM.    Final    Vas [place] Upper Extremity Venous Duplex  Result Date: 06/20/2018 UPPER VENOUS STUDY  Indications: Swelling Performing Technologist: June Leap RDMS, RVT  Examination Guidelines: A complete evaluation includes B-mode imaging, spectral Doppler, color Doppler, and power Doppler as needed of all accessible portions of each vessel. Bilateral testing is considered an integral part of a complete examination. Limited examinations for reoccurring indications may be performed as noted.  Right Findings: +----------+------------+---------+-----------+----------+-------+ RIGHT     CompressiblePhasicitySpontaneousPropertiesSummary +----------+------------+---------+-----------+----------+-------+ IJV           Full       Yes       Yes                      +----------+------------+---------+-----------+----------+-------+ Subclavian    Full       Yes       Yes                      +----------+------------+---------+-----------+----------+-------+ Axillary      Full       Yes       Yes                      +----------+------------+---------+-----------+----------+-------+ Brachial      Full       Yes       Yes                      +----------+------------+---------+-----------+----------+-------+ Radial        Full                                           +----------+------------+---------+-----------+----------+-------+ Ulnar         Full                                          +----------+------------+---------+-----------+----------+-------+ Cephalic    Partial                                 Chronic +----------+------------+---------+-----------+----------+-------+ Basilic       Full                                          +----------+------------+---------+-----------+----------+-------+  Left Findings: +----------+------------+---------+-----------+----------+-------+ LEFT      CompressiblePhasicitySpontaneousPropertiesSummary +----------+------------+---------+-----------+----------+-------+ Subclavian               Yes       Yes                      +----------+------------+---------+-----------+----------+-------+  Summary:  Right: No evidence of deep vein thrombosis in the upper  extremity. Findings consistent with chronic superficial vein thrombosis involving the right cephalic vein, isolated area at antecubital fossa.  Left: No evidence of thrombosis in the subclavian.  *See table(s) above for measurements and observations.  Diagnosing physician: Deitra Mayo MD Electronically signed by Deitra Mayo MD on 06/20/2018 at 7:01:43 PM.    Final     Medications:   . folic acid  1 mg Oral Daily  . methocarbamol  500 mg Oral TID  . multivitamin with minerals  1 tablet Oral Daily  . pantoprazole  40 mg Oral BID AC  . thiamine  100 mg Oral Daily   Or  . thiamine  100 mg Intravenous Daily   Continuous Infusions: . sodium chloride 100 mL/hr at 06/21/18 1631  . cefTRIAXone (ROCEPHIN)  IV 1 g (06/21/18 0905)     LOS: 2 days   Jacquelynn Cree  [facility] Pager [phone]. If unable to reach me by pager, please call my cell phone at [phone].  *Please refer to [email or web address], password [email or web address] to get updated schedule on who will round on this patient, as hospitalists switch teams weekly. If  7PM-7AM, please contact night-coverage at [email or web address], password [email or web address] for any overnight needs.  06/22/2018, 7:39 AM

## 2018-06-22 NOTE — Consult Note (Signed)
Reason for Consult:Left knee effusion Referring Physician: C Rama  Carollee MassedJames Ion is an 53 y.o. male.  HPI: [name] was admitted 3d ago with polyarthralgia of about 1 week's duration. He has had some steroids and antibiotics and has responded somewhat to those. When he was admitted his right shoulder was the main problem. He says that's subsided along with the rest of his joints but the left knee is worse and getting worse daily. He describes an instance 20+ years in the past where fluid was taken off of his knee but doesn't know if there was a diagnosis. Otherwise this is a novel event. He denies hx/o gout.  Past Medical History:  Diagnosis Date  . Acid reflux   . Head injury   . Hepatitis C   . Hiatal hernia     Past Surgical History:  Procedure Laterality Date  . FACIAL FRACTURE SURGERY      Family History  Problem Relation Age of Onset  . Diabetes Mother   . Hypertension Mother   . Hypertension Father     Social History:  reports that he has been smoking cigarettes. He has a 20.00 pack-year smoking history. He has never used smokeless tobacco. He reports previous alcohol use. He reports current drug use. Drug: Cocaine.  Allergies: No Known Allergies  Medications: I have reviewed the patient's current medications.  Results for orders placed or performed during the hospital encounter of 06/19/18 (from the past 48 hour(s))  CBC with Differential/Platelet     Status: Abnormal   Collection Time: 06/21/18  3:40 AM  Result Value Ref Range   WBC 30.2 (H) 4.0 - 10.5 K/uL   RBC 3.11 (L) 4.22 - 5.81 MIL/uL   Hemoglobin 9.8 (L) 13.0 - 17.0 g/dL   HCT 96.027.4 (L) 45.439.0 - 09.852.0 %   MCV 88.1 80.0 - 100.0 fL   MCH 31.5 26.0 - 34.0 pg   MCHC 35.8 30.0 - 36.0 g/dL   RDW 11.914.6 14.711.5 - 82.915.5 %   Platelets 307 150 - 400 K/uL   nRBC 0.1 0.0 - 0.2 %   Neutrophils Relative % 74 %   Neutro Abs 29.0 (H) 1.7 - 7.7 K/uL   Band Neutrophils 22 %   Lymphocytes Relative 0 %   Lymphs Abs 0.0 (L) 0.7 - 4.0  K/uL   Monocytes Relative 2 %   Monocytes Absolute 0.6 0.1 - 1.0 K/uL   Eosinophils Relative 0 %   Eosinophils Absolute 0.0 0.0 - 0.5 K/uL   Basophils Relative 0 %   Basophils Absolute 0.0 0.0 - 0.1 K/uL   Metamyelocytes Relative 2 %   Abs Immature Granulocytes 0.60 (H) 0.00 - 0.07 K/uL    Comment: Performed at [facility], [address] [place], [place] [address]  Comprehensive metabolic panel     Status: Abnormal   Collection Time: 06/21/18  3:40 AM  Result Value Ref Range   Sodium 131 (L) 135 - 145 mmol/L   Potassium 4.0 3.5 - 5.1 mmol/L   Chloride 101 98 - 111 mmol/L   CO2 21 (L) 22 - 32 mmol/L   Glucose, Bld 138 (H) 70 - 99 mg/dL   BUN 86 (H) 6 - 20 mg/dL   Creatinine, Ser 0.861.94 (H) 0.61 - 1.24 mg/dL   Calcium 7.7 (L) 8.9 - 10.3 mg/dL   Total Protein 5.7 (L) 6.5 - 8.1 g/dL   Albumin 1.1 (L) 3.5 - 5.0 g/dL   AST 43 (H) 15 - 41 U/L  ALT 37 0 - 44 U/L   Alkaline Phosphatase 80 38 - 126 U/L   Total Bilirubin 0.9 0.3 - 1.2 mg/dL   GFR calc non Af Amer 38 (L) >60 mL/min   GFR calc Af Amer 44 (L) >60 mL/min   Anion gap 9 5 - 15    Comment: Performed at [facility], [address] [place], [place] [address]  Magnesium     Status: Abnormal   Collection Time: 06/21/18  3:40 AM  Result Value Ref Range   Magnesium 2.7 (H) 1.7 - 2.4 mg/dL    Comment: Performed at [facility], [address], [place], [place] [address]  CK     Status: None   Collection Time: 06/21/18  3:40 AM  Result Value Ref Range   Total CK 85 49 - 397 U/L    Comment: Performed at [facility], [address] [place], [place] [address]  Pathologist smear review     Status: None   Collection Time: 06/21/18  3:40 AM  Result Value Ref Range   Path Review Leukocytosis with maturational left shift      Comment: Reviewed by Dawn L. Charm Barges, M.D. 06/21/2018 Performed at [facility], [address] [place], [place] [address]     Dg Forearm Right  Result Date:  06/20/2018 CLINICAL DATA:  Pain and swelling EXAM: RIGHT FOREARM - 2 VIEW COMPARISON:  None. FINDINGS: No bone or joint finding. Possible soft tissue swelling of the forearm. IMPRESSION: No bone or joint finding. Electronically Signed   By: Paulina Fusi M.D.   On: 06/20/2018 14:05   Dg Wrist Complete Right  Result Date: 06/20/2018 CLINICAL DATA:  Pain and swelling. EXAM: RIGHT WRIST - COMPLETE 3+ VIEW COMPARISON:  None. FINDINGS: There is no evidence of fracture or dislocation. There is no evidence of arthropathy or other focal bone abnormality. Soft tissues are unremarkable. IMPRESSION: Negative. Electronically Signed   By: Paulina Fusi M.D.   On: 06/20/2018 14:06   Dg Humerus Right  Result Date: 06/20/2018 CLINICAL DATA:  Pain and swelling EXAM: RIGHT HUMERUS - 2+ VIEW COMPARISON:  None. FINDINGS: There is no evidence of fracture or other focal bone lesions. Soft tissues are unremarkable. IMPRESSION: Negative. Electronically Signed   By: Paulina Fusi M.D.   On: 06/20/2018 14:06   Vas [place] Upper Extremity Arterial Duplex  Result Date: 06/21/2018 UPPER EXTREMITY ARTERIAL DUPLEX STUDY Indications: Right arm swelling, pain, cellulitis.  Performing Technologist: Jeb Levering RDMS, RVT  Examination Guidelines: A complete evaluation includes B-mode imaging, spectral Doppler, color Doppler, and power Doppler as needed of all accessible portions of each vessel. Bilateral testing is considered an integral part of a complete examination. Limited examinations for reoccurring indications may be performed as noted.  Right Doppler Findings: +-----------+----------+---------+------+--------+ Site       PSV (cm/s)Waveform PlaqueComments +-----------+----------+---------+------+--------+ Subclavian 96        triphasic               +-----------+----------+---------+------+--------+ Axillary   118       triphasic               +-----------+----------+---------+------+--------+ Brachial   111        triphasic               +-----------+----------+---------+------+--------+ Radial     123       triphasic               +-----------+----------+---------+------+--------+  Ulnar      97        triphasic               +-----------+----------+---------+------+--------+ Palmar Arch87        triphasic               +-----------+----------+---------+------+--------+ Left Doppler Findings: +----------+----------+---------+------+--------+ Site      PSV (cm/s)Waveform PlaqueComments +----------+----------+---------+------+--------+ Subclavian86        triphasic               +----------+----------+---------+------+--------+  Summary:  Right: No arterial obstruction visualized in the right upper        extremity. *See table(s) above for measurements and observations. Electronically signed by Coral Else MD on 06/21/2018 at 3:33:44 PM.    Final     Review of Systems  Constitutional: Negative for chills, fever and weight loss.  HENT: Negative for ear discharge, ear pain, hearing loss and tinnitus.   Eyes: Negative for blurred vision, double vision, photophobia and pain.  Respiratory: Negative for cough, sputum production and shortness of breath.   Cardiovascular: Negative for chest pain.  Gastrointestinal: Negative for abdominal pain, nausea and vomiting.  Genitourinary: Negative for dysuria, flank pain, frequency and urgency.  Musculoskeletal: Positive for joint pain (Left knee > right shoulder > right knee). Negative for back pain, falls, myalgias and neck pain.  Neurological: Negative for dizziness, tingling, sensory change, focal weakness, loss of consciousness and headaches.  Endo/Heme/Allergies: Does not bruise/bleed easily.  Psychiatric/Behavioral: Negative for depression, memory loss and substance abuse. The patient is not nervous/anxious.    Blood pressure (!) 147/87, pulse (!) 112, temperature 98.3 F (36.8 C), temperature source Oral, resp. rate 19, height   (1.778 m), weight 74.8 kg, SpO2 97 %. Physical Exam  Constitutional: He appears well-developed and well-nourished. No distress.  HENT:  Head: Normocephalic and atraumatic.  Eyes: Conjunctivae are normal. Right eye exhibits no discharge. Left eye exhibits no discharge. No scleral icterus.  Neck: Normal range of motion.  Cardiovascular: Normal rate and regular rhythm.  Respiratory: Effort normal. No respiratory distress.  Musculoskeletal:     Comments: LLE No traumatic wounds, ecchymosis, or rash  Knee mod TTP, severe pain with AROM/PROM, mod-large effusion  No ankle effusion  Sens DPN, SPN, TN intact  Motor EHL, ext, flex, evers 5/5  DP 2+, PT 2+, No significant edema  Neurological: He is alert.  Skin: Skin is warm and dry. He is not diaphoretic.  Psychiatric: He has a normal mood and affect. His behavior is normal.    Assessment/Plan: Left knee effusion in setting of polyarthralgia -- Will tap knee and send for GS, C&S, and cell count.    Freeman Caldron, PA-C Orthopedic Surgery [phone] 06/22/2018, 1:09 PM

## 2018-06-22 NOTE — Progress Notes (Signed)
Pt complaining of increasing pain in left knee. Upon assessment, left knee swollen. Pt states might had hit his left knee. Ice pack placed on knee. Provider to be made aware.

## 2018-06-23 ENCOUNTER — Inpatient Hospital Stay (HOSPITAL_COMMUNITY): Payer: Medicaid Other

## 2018-06-23 ENCOUNTER — Encounter (HOSPITAL_COMMUNITY): Payer: Self-pay | Admitting: Certified Registered Nurse Anesthetist

## 2018-06-23 ENCOUNTER — Encounter (HOSPITAL_COMMUNITY)
Admission: EM | Disposition: A | Discharge status: Home Health | Payer: Self-pay | Source: Home / Self Care | Attending: Internal Medicine

## 2018-06-23 ENCOUNTER — Inpatient Hospital Stay (HOSPITAL_COMMUNITY): Payer: Medicaid Other | Admitting: Certified Registered Nurse Anesthetist

## 2018-06-23 DIAGNOSIS — Z791 Long term (current) use of non-steroidal anti-inflammatories (NSAID): Secondary | ICD-10-CM

## 2018-06-23 DIAGNOSIS — M4807 Spinal stenosis, lumbosacral region: Secondary | ICD-10-CM

## 2018-06-23 DIAGNOSIS — Z8619 Personal history of other infectious and parasitic diseases: Secondary | ICD-10-CM

## 2018-06-23 DIAGNOSIS — M009 Pyogenic arthritis, unspecified: Secondary | ICD-10-CM | POA: Insufficient documentation

## 2018-06-23 DIAGNOSIS — F17210 Nicotine dependence, cigarettes, uncomplicated: Secondary | ICD-10-CM

## 2018-06-23 DIAGNOSIS — R7881 Bacteremia: Secondary | ICD-10-CM

## 2018-06-23 DIAGNOSIS — D72829 Elevated white blood cell count, unspecified: Secondary | ICD-10-CM | POA: Insufficient documentation

## 2018-06-23 DIAGNOSIS — M25462 Effusion, left knee: Secondary | ICD-10-CM | POA: Insufficient documentation

## 2018-06-23 DIAGNOSIS — N179 Acute kidney failure, unspecified: Secondary | ICD-10-CM

## 2018-06-23 DIAGNOSIS — I96 Gangrene, not elsewhere classified: Secondary | ICD-10-CM

## 2018-06-23 DIAGNOSIS — S83242A Other tear of medial meniscus, current injury, left knee, initial encounter: Secondary | ICD-10-CM | POA: Diagnosis present

## 2018-06-23 DIAGNOSIS — I330 Acute and subacute infective endocarditis: Secondary | ICD-10-CM

## 2018-06-23 DIAGNOSIS — M25422 Effusion, left elbow: Secondary | ICD-10-CM

## 2018-06-23 DIAGNOSIS — B370 Candidal stomatitis: Secondary | ICD-10-CM

## 2018-06-23 HISTORY — PX: KNEE ARTHROSCOPY: SHX127

## 2018-06-23 LAB — CBC
HCT: 30.4 % — ABNORMAL LOW (ref 39.0–52.0)
Hemoglobin: 10.3 g/dL — ABNORMAL LOW (ref 13.0–17.0)
MCH: 30.7 pg (ref 26.0–34.0)
MCHC: 33.9 g/dL (ref 30.0–36.0)
MCV: 90.7 fL (ref 80.0–100.0)
Platelets: 323 10*3/uL (ref 150–400)
RBC: 3.35 MIL/uL — ABNORMAL LOW (ref 4.22–5.81)
RDW: 15.5 % (ref 11.5–15.5)
WBC: 46.6 10*3/uL — ABNORMAL HIGH (ref 4.0–10.5)
nRBC: 0.1 % (ref 0.0–0.2)

## 2018-06-23 LAB — SURGICAL PCR SCREEN
MRSA, PCR: NEGATIVE
Staphylococcus aureus: POSITIVE — AB

## 2018-06-23 LAB — BASIC METABOLIC PANEL
Anion gap: 9 (ref 5–15)
BUN: 46 mg/dL — ABNORMAL HIGH (ref 6–20)
CO2: 21 mmol/L — ABNORMAL LOW (ref 22–32)
Calcium: 7.3 mg/dL — ABNORMAL LOW (ref 8.9–10.3)
Chloride: 101 mmol/L (ref 98–111)
Creatinine, Ser: 1.27 mg/dL — ABNORMAL HIGH (ref 0.61–1.24)
GFR calc Af Amer: >60 mL/min (ref >60)
GFR calc non Af Amer: >60 mL/min (ref >60)
Glucose, Bld: 111 mg/dL — ABNORMAL HIGH (ref 70–99)
Potassium: 4.5 mmol/L (ref 3.5–5.1)
Sodium: 131 mmol/L — ABNORMAL LOW (ref 135–145)

## 2018-06-23 LAB — ECHOCARDIOGRAM COMPLETE
Height: 70 in
Weight: 2640 oz

## 2018-06-23 SURGERY — ARTHROSCOPY, KNEE
Anesthesia: General | Site: Knee | Laterality: Left

## 2018-06-23 MED ORDER — MORPHINE SULFATE (PF) 2 MG/ML IV SOLN
0.5000 mg | INTRAVENOUS | Status: DC | PRN
  Administered 2018-06-23 – 2018-06-24 (×2): 1 mg via INTRAVENOUS
  Filled 2018-06-23 (×3): qty 1

## 2018-06-23 MED ORDER — OXYCODONE HCL 5 MG/5ML PO SOLN: 5.0000 mg | Freq: Once | ORAL | Status: DC | PRN

## 2018-06-23 MED ORDER — PROPOFOL 10 MG/ML IV BOLUS
INTRAVENOUS | Status: DC | PRN
  Administered 2018-06-23: 200 mg via INTRAVENOUS

## 2018-06-23 MED ORDER — ACETAMINOPHEN 10 MG/ML IV SOLN: 1000.0000 mg | Freq: Once | INTRAVENOUS | Status: DC | PRN

## 2018-06-23 MED ORDER — VANCOMYCIN HCL 10 G IV SOLR
1500.0000 mg | INTRAVENOUS | Status: DC
  Administered 2018-06-24 – 2018-06-25 (×2): 1500 mg via INTRAVENOUS
  Filled 2018-06-23 (×3): qty 1500

## 2018-06-23 MED ORDER — BUPIVACAINE-EPINEPHRINE (PF) 0.25% -1:200000 IJ SOLN
INTRAMUSCULAR | Status: DC | PRN
  Administered 2018-06-23: 15 mL

## 2018-06-23 MED ORDER — CLONIDINE HCL (ANALGESIA) 100 MCG/ML EP SOLN
EPIDURAL | Status: DC | PRN
  Administered 2018-06-23: 1 mL

## 2018-06-23 MED ORDER — PROPOFOL 10 MG/ML IV BOLUS
INTRAVENOUS | Status: AC
  Filled 2018-06-23: qty 20

## 2018-06-23 MED ORDER — CLONIDINE HCL (ANALGESIA) 100 MCG/ML EP SOLN
EPIDURAL | Status: AC
  Filled 2018-06-23: qty 10

## 2018-06-23 MED ORDER — KETAMINE HCL 10 MG/ML IJ SOLN
INTRAMUSCULAR | Status: DC | PRN
  Administered 2018-06-23: 15 mg via INTRAVENOUS
  Administered 2018-06-23: 35 mg via INTRAVENOUS

## 2018-06-23 MED ORDER — FENTANYL CITRATE (PF) 100 MCG/2ML IJ SOLN
INTRAMUSCULAR | Status: DC | PRN
  Administered 2018-06-23 (×3): 50 ug via INTRAVENOUS
  Administered 2018-06-23: 100 ug via INTRAVENOUS

## 2018-06-23 MED ORDER — KETAMINE HCL 50 MG/5ML IJ SOSY
PREFILLED_SYRINGE | INTRAMUSCULAR | Status: AC
  Filled 2018-06-23: qty 5

## 2018-06-23 MED ORDER — METOCLOPRAMIDE HCL 5 MG PO TABS: 5.0000 mg | ORAL_TABLET | Freq: Three times a day (TID) | ORAL | Status: DC | PRN

## 2018-06-23 MED ORDER — MIDAZOLAM HCL 5 MG/5ML IJ SOLN
INTRAMUSCULAR | Status: DC | PRN
  Administered 2018-06-23: 2 mg via INTRAVENOUS

## 2018-06-23 MED ORDER — FENTANYL CITRATE (PF) 250 MCG/5ML IJ SOLN
INTRAMUSCULAR | Status: AC
  Filled 2018-06-23: qty 5

## 2018-06-23 MED ORDER — ONDANSETRON HCL 4 MG PO TABS: 4.0000 mg | ORAL_TABLET | Freq: Four times a day (QID) | ORAL | Status: DC | PRN

## 2018-06-23 MED ORDER — MORPHINE SULFATE 4 MG/ML IJ SOLN
INTRAMUSCULAR | Status: DC | PRN
  Administered 2018-06-23: 8 mg

## 2018-06-23 MED ORDER — MORPHINE SULFATE (PF) 4 MG/ML IV SOLN
INTRAVENOUS | Status: AC
  Filled 2018-06-23: qty 2

## 2018-06-23 MED ORDER — SODIUM CHLORIDE 0.9 % IV SOLN
1.0000 g | Freq: Once | INTRAVENOUS | Status: AC
  Administered 2018-06-23: 10:00:00 1 g via INTRAVENOUS
  Filled 2018-06-23: qty 10

## 2018-06-23 MED ORDER — SODIUM CHLORIDE 0.9 % IV SOLN
2.0000 g | INTRAVENOUS | Status: DC
  Administered 2018-06-24: 10:00:00 2 g via INTRAVENOUS
  Filled 2018-06-23: qty 20

## 2018-06-23 MED ORDER — VANCOMYCIN HCL 500 MG IV SOLR
INTRAVENOUS | Status: AC
  Filled 2018-06-23: qty 500

## 2018-06-23 MED ORDER — ONDANSETRON HCL 4 MG/2ML IJ SOLN
INTRAMUSCULAR | Status: AC
  Filled 2018-06-23: qty 2

## 2018-06-23 MED ORDER — 0.9 % SODIUM CHLORIDE (POUR BTL) OPTIME
TOPICAL | Status: DC | PRN
  Administered 2018-06-23: 1000 mL

## 2018-06-23 MED ORDER — SODIUM CHLORIDE 0.9 % IR SOLN
Status: DC | PRN
  Administered 2018-06-23: 12000 mL

## 2018-06-23 MED ORDER — OXYCODONE HCL 5 MG PO TABS: 5.0000 mg | ORAL_TABLET | Freq: Once | ORAL | Status: DC | PRN

## 2018-06-23 MED ORDER — METOCLOPRAMIDE HCL 5 MG/ML IJ SOLN: 5.0000 mg | Freq: Three times a day (TID) | INTRAMUSCULAR | Status: DC | PRN

## 2018-06-23 MED ORDER — LIDOCAINE 2% (20 MG/ML) 5 ML SYRINGE
INTRAMUSCULAR | Status: DC | PRN
  Administered 2018-06-23: 30 mg via INTRAVENOUS

## 2018-06-23 MED ORDER — ONDANSETRON HCL 4 MG/2ML IJ SOLN: 4.0000 mg | Freq: Four times a day (QID) | INTRAMUSCULAR | Status: DC | PRN

## 2018-06-23 MED ORDER — TRAMADOL HCL 50 MG PO TABS
50.0000 mg | ORAL_TABLET | Freq: Four times a day (QID) | ORAL | Status: DC
  Administered 2018-06-23 – 2018-06-28 (×11): 50 mg via ORAL
  Filled 2018-06-23 (×11): qty 1

## 2018-06-23 MED ORDER — GENTAMICIN SULFATE 40 MG/ML IJ SOLN
INTRAMUSCULAR | Status: DC | PRN
  Administered 2018-06-23: 160 mg

## 2018-06-23 MED ORDER — MIDAZOLAM HCL 2 MG/2ML IJ SOLN
INTRAMUSCULAR | Status: AC
  Filled 2018-06-23: qty 2

## 2018-06-23 MED ORDER — BUPIVACAINE HCL (PF) 0.25 % IJ SOLN
INTRAMUSCULAR | Status: AC
  Filled 2018-06-23: qty 30

## 2018-06-23 MED ORDER — FENTANYL CITRATE (PF) 100 MCG/2ML IJ SOLN: 25.0000 ug | INTRAMUSCULAR | Status: DC | PRN

## 2018-06-23 MED ORDER — ONDANSETRON HCL 4 MG/2ML IJ SOLN: 4.0000 mg | Freq: Once | INTRAMUSCULAR | Status: DC | PRN

## 2018-06-23 MED ORDER — VANCOMYCIN HCL 10 G IV SOLR
1500.0000 mg | Freq: Once | INTRAVENOUS | Status: AC
  Administered 2018-06-23: 11:00:00 1500 mg via INTRAVENOUS
  Filled 2018-06-23: qty 1500

## 2018-06-23 MED ORDER — VANCOMYCIN HCL 500 MG IV SOLR
INTRAVENOUS | Status: DC | PRN
  Administered 2018-06-23: 500 mg

## 2018-06-23 MED ORDER — LACTATED RINGERS IV SOLN
INTRAVENOUS | Status: DC | PRN
  Administered 2018-06-23 (×3): via INTRAVENOUS

## 2018-06-23 MED ORDER — DOCUSATE SODIUM 100 MG PO CAPS
100.0000 mg | ORAL_CAPSULE | Freq: Two times a day (BID) | ORAL | Status: DC
  Administered 2018-06-23 – 2018-06-27 (×8): 100 mg via ORAL
  Filled 2018-06-23 (×10): qty 1

## 2018-06-23 MED ORDER — EPINEPHRINE PF 1 MG/ML IJ SOLN
INTRAMUSCULAR | Status: DC | PRN
  Administered 2018-06-23: 4 mg

## 2018-06-23 MED ORDER — BUPIVACAINE-EPINEPHRINE (PF) 0.25% -1:200000 IJ SOLN
INTRAMUSCULAR | Status: AC
  Filled 2018-06-23: qty 30

## 2018-06-23 MED ORDER — EPINEPHRINE PF 1 MG/ML IJ SOLN
INTRAMUSCULAR | Status: AC
  Filled 2018-06-23: qty 4

## 2018-06-23 MED ORDER — PROMETHAZINE HCL 25 MG/ML IJ SOLN: 6.2500 mg | INTRAMUSCULAR | Status: DC | PRN

## 2018-06-23 MED ORDER — PHENYLEPHRINE 40 MCG/ML (10ML) SYRINGE FOR IV PUSH (FOR BLOOD PRESSURE SUPPORT)
PREFILLED_SYRINGE | INTRAVENOUS | Status: AC
  Filled 2018-06-23: qty 10

## 2018-06-23 MED ORDER — GENTAMICIN SULFATE 40 MG/ML IJ SOLN
INTRAMUSCULAR | Status: AC
  Filled 2018-06-23: qty 2

## 2018-06-23 MED ORDER — METHOCARBAMOL 500 MG PO TABS
500.0000 mg | ORAL_TABLET | Freq: Four times a day (QID) | ORAL | Status: DC | PRN
  Administered 2018-06-29 – 2018-07-30 (×9): 500 mg via ORAL
  Filled 2018-06-23 (×8): qty 1

## 2018-06-23 MED ORDER — HYDROCODONE-ACETAMINOPHEN 5-325 MG PO TABS
1.0000 | ORAL_TABLET | ORAL | Status: DC | PRN
  Administered 2018-06-24 – 2018-06-25 (×4): 1 via ORAL
  Administered 2018-06-26: 15:00:00 2 via ORAL
  Administered 2018-06-26: 03:00:00 1 via ORAL
  Administered 2018-06-27 – 2018-06-28 (×4): 2 via ORAL
  Filled 2018-06-23: qty 2
  Filled 2018-06-23: qty 1
  Filled 2018-06-23 (×2): qty 2
  Filled 2018-06-23: qty 1
  Filled 2018-06-23 (×4): qty 2
  Filled 2018-06-23 (×2): qty 1

## 2018-06-23 MED ORDER — LACTATED RINGERS IV SOLN
INTRAVENOUS | Status: DC
  Administered 2018-06-23: 17:00:00 via INTRAVENOUS

## 2018-06-23 MED ORDER — BUPIVACAINE HCL (PF) 0.25 % IJ SOLN
INTRAMUSCULAR | Status: DC | PRN
  Administered 2018-06-23: 20 mL

## 2018-06-23 MED ORDER — METHOCARBAMOL 1000 MG/10ML IJ SOLN
500.0000 mg | Freq: Four times a day (QID) | INTRAVENOUS | Status: DC | PRN
  Filled 2018-06-23: qty 5

## 2018-06-23 SURGICAL SUPPLY — 56 items
BANDAGE ACE 6X5 VEL STRL LF (GAUZE/BANDAGES/DRESSINGS) ×2 IMPLANT
BANDAGE ESMARK 6X9 LF (GAUZE/BANDAGES/DRESSINGS) IMPLANT
BLADE CLIPPER SURG (BLADE) IMPLANT
BLADE EXCALIBUR 4.0MM X 13CM (MISCELLANEOUS) ×2
BLADE EXCALIBUR 4.0X13 (MISCELLANEOUS) ×3 IMPLANT
BNDG CMPR 9X6 STRL LF SNTH (GAUZE/BANDAGES/DRESSINGS)
BNDG CMPR MED 10X6 ELC LF (GAUZE/BANDAGES/DRESSINGS)
BNDG ELASTIC 6X10 VLCR STRL LF (GAUZE/BANDAGES/DRESSINGS) IMPLANT
BNDG ESMARK 6X9 LF (GAUZE/BANDAGES/DRESSINGS)
COVER SURGICAL LIGHT HANDLE (MISCELLANEOUS) ×3 IMPLANT
COVER WAND RF STERILE (DRAPES) ×3 IMPLANT
CUFF TOURNIQUET SINGLE 34IN LL (TOURNIQUET CUFF) IMPLANT
CUFF TOURNIQUET SINGLE 44IN (TOURNIQUET CUFF) IMPLANT
DRAPE ARTHROSCOPY W/POUCH 114 (DRAPES) ×3 IMPLANT
DRAPE U-SHAPE 47X51 STRL (DRAPES) ×3 IMPLANT
DRSG TEGADERM 4X4.75 (GAUZE/BANDAGES/DRESSINGS) ×3 IMPLANT
DRSG XEROFORM 1X8 (GAUZE/BANDAGES/DRESSINGS) ×2 IMPLANT
DURAPREP 26ML APPLICATOR (WOUND CARE) ×3 IMPLANT
DW OUTFLOW CASSETTE/TUBE SET (MISCELLANEOUS) ×5 IMPLANT
EVACUATOR 1/8 PVC DRAIN (DRAIN) ×2 IMPLANT
EXCALIBUR 3.8MM X 13CM (MISCELLANEOUS) ×2 IMPLANT
GAUZE SPONGE 4X4 12PLY STRL (GAUZE/BANDAGES/DRESSINGS) ×2 IMPLANT
GAUZE XEROFORM 1X8 LF (GAUZE/BANDAGES/DRESSINGS) ×2 IMPLANT
GLOVE BIOGEL PI IND STRL 8 (GLOVE) ×1 IMPLANT
GLOVE BIOGEL PI INDICATOR 8 (GLOVE) ×2
GLOVE SURG ORTHO 8.0 STRL STRW (GLOVE) ×3 IMPLANT
GOWN STRL REUS W/ TWL LRG LVL3 (GOWN DISPOSABLE) ×2 IMPLANT
GOWN STRL REUS W/ TWL XL LVL3 (GOWN DISPOSABLE) ×1 IMPLANT
GOWN STRL REUS W/TWL LRG LVL3 (GOWN DISPOSABLE) ×6
GOWN STRL REUS W/TWL XL LVL3 (GOWN DISPOSABLE) ×3
KIT BASIN OR (CUSTOM PROCEDURE TRAY) ×3 IMPLANT
KIT STIMULAN RAPID CURE 5CC (Orthopedic Implant) ×2 IMPLANT
KIT TURNOVER KIT B (KITS) ×3 IMPLANT
MANIFOLD NEPTUNE II (INSTRUMENTS) IMPLANT
NDL 18GX1X1/2 (RX/OR ONLY) (NEEDLE) IMPLANT
NDL HYPO 25GX1X1/2 BEV (NEEDLE) ×1 IMPLANT
NEEDLE 18GX1X1/2 (RX/OR ONLY) (NEEDLE) IMPLANT
NEEDLE HYPO 25GX1X1/2 BEV (NEEDLE) ×3 IMPLANT
NS IRRIG 1000ML POUR BTL (IV SOLUTION) IMPLANT
PACK ARTHROSCOPY DSU (CUSTOM PROCEDURE TRAY) ×3 IMPLANT
PAD ARMBOARD 7.5X6 YLW CONV (MISCELLANEOUS) ×6 IMPLANT
PADDING CAST COTTON 6X4 STRL (CAST SUPPLIES) ×3 IMPLANT
SPONGE LAP 4X18 RFD (DISPOSABLE) ×3 IMPLANT
SUT ETHILON 3 0 PS 1 (SUTURE) ×5 IMPLANT
SYR 20ML ECCENTRIC (SYRINGE) ×3 IMPLANT
SYR CONTROL 10ML LL (SYRINGE) IMPLANT
SYR TB 1ML LUER SLIP (SYRINGE) ×3 IMPLANT
SYRINGE 20CC LL (MISCELLANEOUS) ×2 IMPLANT
TAPE STRIPS DRAPE STRL (GAUZE/BANDAGES/DRESSINGS) ×2 IMPLANT
TOWEL OR 17X24 6PK STRL BLUE (TOWEL DISPOSABLE) ×3 IMPLANT
TOWEL OR 17X26 10 PK STRL BLUE (TOWEL DISPOSABLE) ×3 IMPLANT
TUBE CONNECTING 12'X1/4 (SUCTIONS) ×1
TUBE CONNECTING 12X1/4 (SUCTIONS) ×2 IMPLANT
TUBING ARTHROSCOPY IRRIG 16FT (MISCELLANEOUS) ×3 IMPLANT
WAND HAND CNTRL MULTIVAC 90 (MISCELLANEOUS) IMPLANT
WATER STERILE IRR 1000ML POUR (IV SOLUTION) ×3 IMPLANT

## 2018-06-23 NOTE — Progress Notes (Signed)
Echocardiogram 2D Echocardiogram has been performed.  [name] 06/23/2018, 2:44 PM

## 2018-06-23 NOTE — Anesthesia Postprocedure Evaluation (Signed)
Anesthesia Post Note  Patient: Haytham Vairo  Procedure(s) Performed: LEFT KNEE ARTHROSCOPY KNEE I&D. (Left Knee)     Patient location during evaluation: PACU Anesthesia Type: General Level of consciousness: awake and alert Pain management: pain level controlled Vital Signs Assessment: post-procedure vital signs reviewed and stable Respiratory status: spontaneous breathing, nonlabored ventilation, respiratory function stable and patient connected to nasal cannula oxygen Cardiovascular status: blood pressure returned to baseline and stable Postop Assessment: no apparent nausea or vomiting Anesthetic complications: no    Last Vitals:  Vitals:   06/23/18 2024 06/23/18 2049  BP:  128/84  Pulse: (!) 116 (!) 115  Resp: 19 18  Temp: 36.8 C 36.7 C  SpO2: 96% 97%    Last Pain:  Vitals:   06/23/18 2053  TempSrc:   PainSc: 7                  Kennieth Rad

## 2018-06-23 NOTE — Progress Notes (Signed)
Occupational Therapy Treatment Patient Details Name: Clifford MassedJames Chan Chan: [id number] DOB: 06/29/1965 Today's Date: 06/23/2018    History of present illness Pt. with PMH of hep C, substance abuse; admitted on 06/19/2018, presented with complaint of back pain, was found to have musculoskeletal back pain as well as right upper extremity cellulitis.   OT comments  Poor mobility. Pt unable to progress due to severe "15/10 pain." RN aware. Pt performing BUE AROM elbow through digits with visual cues and no pain reported in R arm. Pt reports that his pain is in L knee and back at this time. Pt education on ROM using R knee and ankle dorsiflex and plantarflex. Pt would benefit and reports that "MD thinks that should be feeling better by Monday by antibiotics." Pt would benefit from continued OT skilled services for ADL and HEP progression. OT to follow acutely.    Follow Up Recommendations  Supervision - Intermittent(to be determined as pt's pain too severe for OOB)    Equipment Recommendations  Other (comment)(to be determined)    Recommendations for Other Services      Precautions / Restrictions Precautions Precautions: None Restrictions Weight Bearing Restrictions: No       Mobility Bed Mobility Overal bed mobility: Needs Assistance Bed Mobility: Supine to Sit;Rolling Rolling: Max assist         General bed mobility comments: Rolling over took my breath away  Transfers Overall transfer level: Needs assistance               General transfer comment: Pt refused any further transfers today other than supine to sit. He states he hurts too much but will try again tommorow.    Balance                                           ADL either performed or assessed with clinical judgement   ADL                                       Functional mobility during ADLs: Maximal assistance;+2 for physical assistance;+2 for safety/equipment(bed mobility  only at this time) General ADL Comments: Pt requires modA for UB ADL and totalA LB ADL     Vision   Vision Assessment?: No apparent visual deficits   Perception     Praxis      Cognition Arousal/Alertness: Awake/alert Behavior During Therapy: WFL for tasks assessed/performed Overall Cognitive Status: Within Functional Limits for tasks assessed                                          Exercises Exercises: General Upper Extremity General Exercises - Upper Extremity Elbow Flexion: AROM;Both;10 reps;Supine Elbow Extension: AROM;Both;10 reps;Supine Wrist Flexion: AROM;10 reps;Both;Supine Wrist Extension: AROM;10 reps;Both;Supine Digit Composite Flexion: AROM;10 reps;Both;Supine Composite Extension: AROM;Both;10 reps;Supine   Shoulder Instructions       General Comments Pt in too severe of pain. pt performing bed mobility only for repositioning. R knee had increased movement s/p knee aspiration; l knee aspiration today prior to OT visit- severe pain and in spine.    Pertinent Vitals/ Pain       Pain Assessment: 0-10 Pain Score: 10-Worst pain ever Pain Descriptors /  Indicators: Aching;Discomfort;Pressure Pain Intervention(s): Limited activity within patient's tolerance;Repositioned  Home Living                                          Prior Functioning/Environment              Frequency  Min 2X/week        Progress Toward Goals  OT Goals(current goals can now be found in the care plan section)  Progress towards OT goals: Not progressing toward goals - comment(pain too severe)  Acute Rehab OT Goals Patient Stated Goal: return home OT Goal Formulation: With patient Time For Goal Achievement: 07/05/18 Potential to Achieve Goals: Good ADL Goals Pt Will Perform Grooming: with modified independence;sitting Pt Will Perform Upper Body Dressing: with modified independence;sitting Pt Will Perform Lower Body Dressing: with modified  independence;sitting/lateral leans;sit to/from stand Pt Will Transfer to Toilet: with min guard assist;ambulating;bedside commode Pt/caregiver will Perform Home Exercise Program: Both right and left upper extremity;With Supervision;With written HEP provided Additional ADL Goal #1: Pt will sit EOB for ADL tasks with set-upA and fair sitting balance  Plan Discharge plan remains appropriate    Co-evaluation                 AM-PAC OT "6 Clicks" Daily Activity     Outcome Measure   Help from another person eating meals?: A Little Help from another person taking care of personal grooming?: A Lot Help from another person toileting, which includes using toliet, bedpan, or urinal?: Total Help from another person bathing (including washing, rinsing, drying)?: Total Help from another person to put on and taking off regular upper body clothing?: Total Help from another person to put on and taking off regular lower body clothing?: Total 6 Click Score: 9    End of Session    OT Visit Diagnosis: Muscle weakness (generalized) (M62.81);Pain Pain - Right/Left: Left Pain - part of body: Knee   Activity Tolerance Patient limited by pain;Treatment limited secondary to agitation   Patient Left in bed;with call bell/phone within reach;with bed alarm set   Nurse Communication Mobility status;Patient requests pain meds        Time: [id number] OT Time Calculation (min): 37 min  Charges: OT General Charges $OT Visit: 1 Visit OT Treatments $Therapeutic Activity: 23-37 mins  Revonda Standard Cecil Cranker) Glendell Docker OTR/L Acute Rehabilitation Services Pager: [phone] Office: [phone]   Lonzo Cloud 06/23/2018, 3:29 PM

## 2018-06-23 NOTE — Transfer of Care (Signed)
Immediate Anesthesia Transfer of Care Note  Patient: [name]  Procedure(s) Performed: LEFT KNEE ARTHROSCOPY KNEE I&D. (Left Knee)  Patient Location: PACU  Anesthesia Type:General  Level of Consciousness: awake and alert   Airway & Oxygen Therapy: Patient Spontanous Breathing  Post-op Assessment: Report given to RN and Post -op Vital signs reviewed and stable  Post vital signs: Reviewed and stable  Last Vitals:  Vitals Value Taken Time  BP 126/83 06/23/2018  8:02 PM  Temp    Pulse 118 06/23/2018  8:04 PM  Resp 21 06/23/2018  8:04 PM  SpO2 98 % 06/23/2018  8:04 PM  Vitals shown include unvalidated device data.  Last Pain:  Vitals:   06/23/18 1654  TempSrc: Oral  PainSc:       Patients Stated Pain Goal: 2 (06/23/18 1244)  Complications: No apparent anesthesia complications

## 2018-06-23 NOTE — Progress Notes (Signed)
Pharmacy Antibiotic Note  [name] is a 53 y.o. male admitted on 06/19/2018 with back pain and started on Rocephin for cellulitis.  Now concerned with septic joint and Pharmacy has been consulted for vancomycin dosing.  Patient is s/p aspiration.  Renal function is improving, afebrile, WBC up 46.6 (on steroid).  Plan: Vanc 1500mg  IV Q24H for AUC 523 using SCr 1.27 Change Rocephin to 2gm IV Q24H Monitor renal fxn, clinical progress, vanc AUC as indicated   Height: 5\' 10"  (177.8 cm) Weight: 165 lb (74.8 kg) IBW/kg (Calculated) : 73  Temp (24hrs), Avg:98.3 F (36.8 C), Min:97.8 F (36.6 C), Max:98.9 F (37.2 C)  Recent Labs  Lab 06/19/18 1447 06/20/18 0445 06/20/18 0912 06/21/18 0340 06/23/18 0336  WBC 18.9* 24.0*  --  30.2* 46.6*  CREATININE 3.56* 2.76* 2.55* 1.94* 1.27*  LATICACIDVEN  --   --  1.4  --   --     Estimated Creatinine Clearance: 69.5 mL/min (A) (by C-G formula based on SCr of 1.27 mg/dL (H)).    No Known Allergies  Vanc 5/15 >> CTX 5/12 >>  5/11 covid - negative 5/12 BCx - NGTD 5/14 synovial fluid - GPC on stain  Pat Sires D. Laney Potash, PharmD, BCPS, BCCCP 06/23/2018, 10:39 AM

## 2018-06-23 NOTE — Progress Notes (Signed)
Arrived from PACU. Alert and oriented to person, time, and situation. Compression wrap in place with hemovac charged. C/o of surgical pain 7/10. Call light within reach.

## 2018-06-23 NOTE — Anesthesia Preprocedure Evaluation (Addendum)
Anesthesia Evaluation  Patient identified by MRN, date of birth, ID band Patient awake    Reviewed: Allergy & Precautions, NPO status , Patient's Chart, lab work & pertinent test results  Airway Mallampati: II  TM Distance: >3 FB Neck ROM: Full    Dental  (+) Poor Dentition, Dental Advisory Given   Pulmonary Current Smoker,    breath sounds clear to auscultation       Cardiovascular negative cardio ROS   Rhythm:Regular Rate:Normal     Neuro/Psych negative neurological ROS     GI/Hepatic hiatal hernia, GERD  ,(+)     substance abuse  cocaine use, Hepatitis -, C  Endo/Other  negative endocrine ROS  Renal/GU ARFRenal disease     Musculoskeletal Left knee septic arthritis   Abdominal   Peds  Hematology  (+) anemia ,   Anesthesia Other Findings Day of surgery medications reviewed with the patient.  Reproductive/Obstetrics                            Anesthesia Physical Anesthesia Plan  ASA: III and emergent  Anesthesia Plan: General   Post-op Pain Management:    Induction: Intravenous  PONV Risk Score and Plan: 2 and Treatment may vary due to age or medical condition and Ondansetron  Airway Management Planned: LMA  Additional Equipment:   Intra-op Plan:   Post-operative Plan: Extubation in OR  Informed Consent: I have reviewed the patients History and Physical, chart, labs and discussed the procedure including the risks, benefits and alternatives for the proposed anesthesia with the patient or authorized representative who has indicated his/her understanding and acceptance.     Dental advisory given  Plan Discussed with: CRNA and Anesthesiologist  Anesthesia Plan Comments:        Anesthesia Quick Evaluation

## 2018-06-23 NOTE — Anesthesia Procedure Notes (Signed)
Procedure Name: LMA Insertion Date/Time: 06/23/2018 6:28 PM Performed by: Gwenyth Allegra, CRNA Pre-anesthesia Checklist: Patient identified Patient Re-evaluated:Patient Re-evaluated prior to induction Oxygen Delivery Method: Circle system utilized Preoxygenation: Pre-oxygenation with 100% oxygen Induction Type: IV induction LMA: LMA inserted LMA Size: 4.0 Number of attempts: 1 Tube secured with: Tape Dental Injury: Teeth and Oropharynx as per pre-operative assessment
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
## 2018-06-23 NOTE — Brief Op Note (Signed)
   06/23/2018  7:53 PM  PATIENT:  [name]  53 y.o. male  PRE-OPERATIVE DIAGNOSIS:  Left knee infection.  POST-OPERATIVE DIAGNOSIS:  Left knee infection.medial meniscal tear  PROCEDURE:  Procedure(s): LEFT KNEE ARTHROSCOPY KNEE I&D. Partial medial menisectomy  SURGEON:  Surgeon(s): August Saucer, Corrie Mckusick, MD  ASSISTANT: Enrique Sack rnfa  ANESTHESIA:   general  EBL: 5 ml    Total I/O In: 1600 [I.V.:1600] Out: 0   BLOOD ADMINISTERED: none  DRAINS: (left knee) Hemovact drain(s) in the left knee with  Suction Off   LOCAL MEDICATIONS USED:  Marcaine mso4 clonidine  SPECIMEN:  No Specimen  COUNTS:  YES  TOURNIQUET:  * No tourniquets in log *  DICTATION: .Other Dictation: Dictation Number [id number]  PLAN OF CARE: Admit to inpatient   PATIENT DISPOSITION:  PACU - hemodynamically stable

## 2018-06-23 NOTE — Progress Notes (Signed)
PROGRESS NOTE  [name] JKK:[id number] DOB: 10/14/1965 DOA: 06/19/2018 PCP: Patient, No Pcp Per   LOS: 3 days   Brief Narrative / Interim history: 53 year old male with history of hep C and substance abuse who was admitted to the hospital on 06/18/2017 with complaints of back pain as well as right upper extremity cellulitis.  Subjective: Continues to complain of severe left knee pain as well as left elbow pain.  He states that he was able to take a few steps to the bedside chair yesterday but was excruciating pain in his left knee.  He also tells me that his left elbow has become more swollen and he has pain and difficulties with straightening of his left arm.  He denies any fever or chills.  Assessment & Plan: Principal Problem:   Cellulitis Active Problems:   Hepatitis C antibody test positive   Intractable back pain   AKI (acute kidney injury) (HCC)   Spinal stenosis of lumbosacral region   GERD (gastroesophageal reflux disease)   Mild protein-calorie malnutrition (HCC)   Hypoalbuminemia   Mildly underweight adult   Hyponatremia   Principal Problem Left upper extremity cellulitis/sepsis complicated by superficial thrombosis and acute toxic metabolic encephalopathy -Blood cultures remain negative, patient has been maintained on ceftriaxone and will add vancomycin today due to new findings. -Right upper extremity Doppler was negative for DVT but did show chronic superficial thrombosis, arterial duplex did not show any obstruction  Active Problems Septic arthritis in left knee  -Orthopedic surgery consulted, patient underwent knee aspiration yesterday which showed 19 K WBC with 81% neutrophils, but regardless Gram stain was positive for gram-positive's -He continues to have significant pain in that knee, his white count actually got worse at 46K today, have added vancomycin.  Appreciate Ortho, suspect patient will need washout.  Discussed with Rebbeca Paul, PA -Consulted ID  as well today, discussed with Dr. Luciana Axe.  High concern for bacteremia blood cultures are negative  Left elbow pain and swelling -Concern for septic arthritis, appreciate Ortho, patient tells me that his left elbow is gotten worse today  Acute kidney injury -Patient's creatinine on admission was 3.5, likely in the setting of sepsis, improved with IV fluids & down to 1.27 this morning.  Alcohol/polysubstance abuse -He is not triggering significantly, patient tells me that he has not had anything to drink in the past 2 weeks.  He also denies recent drugs but per daughter this is not true -determined to quit smoking however  Tractable back pain secondary to severe lumbosacral spinal stenosis -MRI completed on 06/13/2018, neuro-surgery also evaluated  Hyponatremia -Resolving with IV fluids  Protein calorie malnutrition/hypoalbuminemia underweight  Scheduled Meds: . bupivacaine  10 mL Infiltration Once  . folic acid  1 mg Oral Daily  . methocarbamol  500 mg Oral TID  . methylPREDNISolone acetate  80 mg Intra-articular Once  . multivitamin with minerals  1 tablet Oral Daily  . pantoprazole  40 mg Oral BID AC  . thiamine  100 mg Oral Daily   Or  . thiamine  100 mg Intravenous Daily   Continuous Infusions: . sodium chloride 100 mL/hr at 06/21/18 1631  . [START ON 06/24/2018] cefTRIAXone (ROCEPHIN)  IV    . vancomycin 1,500 mg (06/23/18 1110)  . [START ON 06/24/2018] vancomycin     PRN Meds:.acetaminophen **OR** acetaminophen, alum & mag hydroxide-simeth, morphine injection, ondansetron **OR** ondansetron (ZOFRAN) IV, polyethylene glycol  DVT prophylaxis: SCDs Code Status: Full code Family Communication: No family at bedside, will call  daughter later Disposition Plan: To be determined  Consultants:   Neurosurgery  Orthopedic surgery  Infectious disease  Procedures:   Left knee aspiration 5/15  Antimicrobials:  Ceftriaxone 5/12 >>  Vancomycin 5/15 >>  Objective:  Vitals:   06/22/18 1640 06/22/18 1952 06/22/18 2338 06/23/18 0747  BP: 124/81 (!) 138/99 137/85 139/80  Pulse: (!) 106 (!) 113 (!) 118 (!) 108  Resp:  Temp: 97.8 F (36.6 C) 98.5 F (36.9 C) 97.9 F (36.6 C) 98.9 F (37.2 C)  TempSrc: Oral Oral Oral Oral  SpO2: 97% 98% 99% 98%  Weight:      Height:        Intake/Output Summary (Last 24 hours) at 06/23/2018 1137 Last data filed at 06/23/2018 0800 Gross per 24 hour  Intake 1729 ml  Output 2175 ml  Net -446 ml   Filed Weights   06/19/18 1303  Weight: 74.8 kg    Examination:  Constitutional: NAD Eyes: lids and conjunctivae normal ENMT: Mucous membranes are moist. No oropharyngeal exudates Neck: normal, supple, no masses Respiratory: clear to auscultation bilaterally, no wheezing, no crackles. Normal respiratory effort. Cardiovascular: Regular rate and rhythm, no murmurs / rubs / gallops. No LE edema. Abdomen: no tenderness. Bowel sounds positive.  Musculoskeletal: no clubbing / cyanosis.  Skin: Swelling in the left elbow, mild surrounding erythematous rash on anterolateral aspect of the forearm.  Necrotic appearing well demarcated lesion on left index Neurologic: CN 2-12 grossly intact. Strength 5/5 in all 4.  Psychiatric: Normal judgment and insight. Alert and oriented x 3. Normal mood.    Data Reviewed: I have independently reviewed following labs and imaging studies   CBC: Recent Labs  Lab 06/19/18 1447 06/20/18 0445 06/21/18 0340 06/23/18 0336  WBC 18.9* 24.0* 30.2* 46.6*  NEUTROABS  --   --  29.0*  --   HGB 12.6* 11.6* 9.8* 10.3*  HCT 34.5* 31.9* 27.4* 30.4*  MCV 86.5 85.8 88.1 90.7  PLT 299 297 307 323   Basic Metabolic Panel: Recent Labs  Lab 06/19/18 1447 06/20/18 0445 06/20/18 0912 06/21/18 0340 06/23/18 0336  NA 122* 125* 127* 131* 131*  K 3.3* 3.9 3.9 4.0 4.5  CL 85* 86* 94* 101 101  CO2 22 19* 21* 21* 21*  GLUCOSE 153* 142* 140* 138* 111*  BUN 120* 116* 109* 86* 46*  CREATININE  3.56* 2.76* 2.55* 1.94* 1.27*  CALCIUM 7.4* 7.7* 7.7* 7.7* 7.3*  MG 2.8*  --  2.6* 2.7*  --   PHOS  --   --  4.0  --   --    GFR: Estimated Creatinine Clearance: 69.5 mL/min (A) (by C-G formula based on SCr of 1.27 mg/dL (H)). Liver Function Tests: Recent Labs  Lab 06/20/18 0912 06/21/18 0340  AST 58* 43*  ALT 49* 37  ALKPHOS 82 80  BILITOT 1.1 0.9  PROT 6.0* 5.7*  ALBUMIN 1.3* 1.1*   No results for input(s): LIPASE, AMYLASE in the last 168 hours. No results for input(s): AMMONIA in the last 168 hours. Coagulation Profile: No results for input(s): INR, PROTIME in the last 168 hours. Cardiac Enzymes: Recent Labs  Lab 06/20/18 0912 06/21/18 0340  CKTOTAL 103 85   BNP (last 3 results) No results for input(s): PROBNP in the last 8760 hours. HbA1C: No results for input(s): HGBA1C in the last 72 hours. CBG: No results for input(s): GLUCAP in the last 168 hours. Lipid Profile: No results for input(s): CHOL, HDL, LDLCALC, TRIG, CHOLHDL, LDLDIRECT in  the last 72 hours. Thyroid Function Tests: No results for input(s): TSH, T4TOTAL, FREET4, T3FREE, THYROIDAB in the last 72 hours. Anemia Panel: No results for input(s): VITAMINB12, FOLATE, FERRITIN, TIBC, IRON, RETICCTPCT in the last 72 hours. Urine analysis:    Component Value Date/Time   COLORURINE YELLOW 06/19/2018 1944   APPEARANCEUR HAZY (A) 06/19/2018 1944   LABSPEC 1.014 06/19/2018 1944   PHURINE 5.0 06/19/2018 1944   GLUCOSEU 50 (A) 06/19/2018 1944   HGBUR MODERATE (A) 06/19/2018 1944   BILIRUBINUR NEGATIVE 06/19/2018 1944   KETONESUR NEGATIVE 06/19/2018 1944   PROTEINUR NEGATIVE 06/19/2018 1944   NITRITE NEGATIVE 06/19/2018 1944   LEUKOCYTESUR NEGATIVE 06/19/2018 1944   Sepsis Labs: Invalid input(s): PROCALCITONIN, LACTICIDVEN  Recent Results (from the past 240 hour(s))  SARS Coronavirus 2 [facility] order, Performed in [facility] hospital lab)     Status: None   Collection Time: 06/19/18  2:12 PM  Result  Value Ref Range Status   SARS Coronavirus 2 NEGATIVE NEGATIVE Final    Comment: (NOTE) If result is NEGATIVE SARS-CoV-2 target nucleic acids are NOT DETECTED. The SARS-CoV-2 RNA is generally detectable in upper and lower  respiratory specimens during the acute phase of infection. The lowest  concentration of SARS-CoV-2 viral copies this assay can detect is 250  copies / mL. A negative result does not preclude SARS-CoV-2 infection  and should not be used as the sole basis for treatment or other  patient management decisions.  A negative result may occur with  improper specimen collection / handling, submission of specimen other  than nasopharyngeal swab, presence of viral mutation(s) within the  areas targeted by this assay, and inadequate number of viral copies  (<250 copies / mL). A negative result must be combined with clinical  observations, patient history, and epidemiological information. If result is POSITIVE SARS-CoV-2 target nucleic acids are DETECTED. The SARS-CoV-2 RNA is generally detectable in upper and lower  respiratory specimens dur ing the acute phase of infection.  Positive  results are indicative of active infection with SARS-CoV-2.  Clinical  correlation with patient history and other diagnostic information is  necessary to determine patient infection status.  Positive results do  not rule out bacterial infection or co-infection with other viruses. If result is PRESUMPTIVE POSTIVE SARS-CoV-2 nucleic acids MAY BE PRESENT.   A presumptive positive result was obtained on the submitted specimen  and confirmed on repeat testing.  While 2019 novel coronavirus  (SARS-CoV-2) nucleic acids may be present in the submitted sample  additional confirmatory testing may be necessary for epidemiological  and / or clinical management purposes  to differentiate between  SARS-CoV-2 and other Sarbecovirus currently known to infect humans.  If clinically indicated additional testing  with an alternate test  methodology [phone]) is advised. The SARS-CoV-2 RNA is generally  detectable in upper and lower respiratory sp ecimens during the acute  phase of infection. The expected result is Negative. Fact Sheet for Patients:  BoilerBrush.com.cy Fact Sheet for Healthcare Providers: https://pope.com/ This test is not yet approved or cleared by the [place] FDA and has been authorized for detection and/or diagnosis of SARS-CoV-2 by FDA under an Emergency Use Authorization (EUA).  This EUA will remain in effect (meaning this test can be used) for the duration of the COVID-19 declaration under Section 564(b)(1) of the Act, 21 U.S.C. section 360bbb-3(b)(1), unless the authorization is terminated or revoked sooner. Performed at [name], [address], [place], [place] [address]   Culture, blood (routine x  2)     Status: None (Preliminary result)   Collection Time: 06/20/18  9:00 AM  Result Value Ref Range Status   Specimen Description BLOOD RIGHT HAND  Final   Special Requests   Final    BOTTLES DRAWN AEROBIC ONLY Blood Culture results may not be optimal due to an inadequate volume of blood received in culture bottles   Culture   Final    NO GROWTH 3 DAYS Performed at [facility], [address] [place], [place] [address]    Report Status PENDING  Incomplete  Culture, blood (routine x 2)     Status: None (Preliminary result)   Collection Time: 06/20/18  9:10 AM  Result Value Ref Range Status   Specimen Description BLOOD LEFT HAND  Final   Special Requests   Final    BOTTLES DRAWN AEROBIC ONLY Blood Culture adequate volume   Culture   Final    NO GROWTH 3 DAYS Performed at [facility], [address], [place], [place] [address]    Report Status PENDING  Incomplete  Body fluid culture     Status: None (Preliminary result)   Collection Time: 06/22/18  1:05 PM  Result Value Ref Range Status    Specimen Description SYNOVIAL  Final   Special Requests KNEE  Final   Gram Stain   Final    MODERATE WBC PRESENT, PREDOMINANTLY PMN RARE GRAM POSITIVE COCCI IN PAIRS AND CHAINS    Culture   Final    NO GROWTH < 24 HOURS Performed at [facility], [address] [place], [place] [address]    Report Status PENDING  Incomplete      Radiology Studies: Vas [place] Upper Extremity Arterial Duplex  Result Date: 06/21/2018 UPPER EXTREMITY ARTERIAL DUPLEX STUDY Indications: Right arm swelling, pain, cellulitis.  Performing Technologist: Jeb Levering RDMS, RVT  Examination Guidelines: A complete evaluation includes B-mode imaging, spectral Doppler, color Doppler, and power Doppler as needed of all accessible portions of each vessel. Bilateral testing is considered an integral part of a complete examination. Limited examinations for reoccurring indications may be performed as noted.  Right Doppler Findings: +-----------+----------+---------+------+--------+ Site       PSV (cm/s)Waveform PlaqueComments +-----------+----------+---------+------+--------+ Subclavian 96        triphasic               +-----------+----------+---------+------+--------+ Axillary   118       triphasic               +-----------+----------+---------+------+--------+ Brachial   111       triphasic               +-----------+----------+---------+------+--------+ Radial     123       triphasic               +-----------+----------+---------+------+--------+ Ulnar      97        triphasic               +-----------+----------+---------+------+--------+ Palmar Arch87        triphasic               +-----------+----------+---------+------+--------+ Left Doppler Findings: +----------+----------+---------+------+--------+ Site      PSV (cm/s)Waveform PlaqueComments +----------+----------+---------+------+--------+ Subclavian86        triphasic                +----------+----------+---------+------+--------+  Summary:  Right: No arterial obstruction visualized in the right upper  extremity. *See table(s) above for measurements and observations. Electronically signed by Coral ElseVance Brabham MD on 06/21/2018 at 3:33:44 PM.    Final     Pamella Pertostin Gherghe, MD, PhD [facility]  Contact via  [email or web address]  [facility] Office Info P: [phone]  F: [phone]

## 2018-06-23 NOTE — Progress Notes (Signed)
Nursing Director as well as charge nurse spoke to family member at length regarding the care of her father. Patient's family member (whom is also a Runner, broadcasting/film/video) calls multiple times to speak to various members of the inter/multi disciplinary team regarding updates, plan of care, disposition, etc. The members of the interdisciplinary team oblige and answer questions; however, she becomes rude. We will continue to communicate with her with [employer] in mind, and we will continue to provide updates as appropriate.

## 2018-06-24 ENCOUNTER — Inpatient Hospital Stay (HOSPITAL_COMMUNITY): Payer: Medicaid Other

## 2018-06-24 ENCOUNTER — Encounter (HOSPITAL_COMMUNITY): Payer: Self-pay

## 2018-06-24 DIAGNOSIS — F1910 Other psychoactive substance abuse, uncomplicated: Secondary | ICD-10-CM | POA: Diagnosis present

## 2018-06-24 DIAGNOSIS — M549 Dorsalgia, unspecified: Secondary | ICD-10-CM

## 2018-06-24 DIAGNOSIS — X58XXXD Exposure to other specified factors, subsequent encounter: Secondary | ICD-10-CM

## 2018-06-24 DIAGNOSIS — S50312D Abrasion of left elbow, subsequent encounter: Secondary | ICD-10-CM

## 2018-06-24 DIAGNOSIS — M25422 Effusion, left elbow: Secondary | ICD-10-CM | POA: Diagnosis present

## 2018-06-24 DIAGNOSIS — L0293 Carbuncle, unspecified: Secondary | ICD-10-CM | POA: Diagnosis present

## 2018-06-24 DIAGNOSIS — S0990XA Unspecified injury of head, initial encounter: Secondary | ICD-10-CM | POA: Insufficient documentation

## 2018-06-24 DIAGNOSIS — R05 Cough: Secondary | ICD-10-CM

## 2018-06-24 DIAGNOSIS — S60511D Abrasion of right hand, subsequent encounter: Secondary | ICD-10-CM

## 2018-06-24 DIAGNOSIS — D649 Anemia, unspecified: Secondary | ICD-10-CM | POA: Diagnosis present

## 2018-06-24 DIAGNOSIS — F1410 Cocaine abuse, uncomplicated: Secondary | ICD-10-CM

## 2018-06-24 DIAGNOSIS — L03114 Cellulitis of left upper limb: Secondary | ICD-10-CM

## 2018-06-24 DIAGNOSIS — M009 Pyogenic arthritis, unspecified: Secondary | ICD-10-CM

## 2018-06-24 HISTORY — DX: Other psychoactive substance abuse, uncomplicated: F19.10

## 2018-06-24 LAB — CBC
HCT: 28.4 % — ABNORMAL LOW (ref 39.0–52.0)
Hemoglobin: 9.8 g/dL — ABNORMAL LOW (ref 13.0–17.0)
MCH: 31.2 pg (ref 26.0–34.0)
MCHC: 34.5 g/dL (ref 30.0–36.0)
MCV: 90.4 fL (ref 80.0–100.0)
Platelets: 320 10*3/uL (ref 150–400)
RBC: 3.14 MIL/uL — ABNORMAL LOW (ref 4.22–5.81)
RDW: 15.7 % — ABNORMAL HIGH (ref 11.5–15.5)
WBC: 46.1 10*3/uL — ABNORMAL HIGH (ref 4.0–10.5)
nRBC: 0 % (ref 0.0–0.2)

## 2018-06-24 LAB — COMPREHENSIVE METABOLIC PANEL
ALT: 27 U/L (ref 0–44)
AST: 24 U/L (ref 15–41)
Albumin: 1.1 g/dL — ABNORMAL LOW (ref 3.5–5.0)
Alkaline Phosphatase: 94 U/L (ref 38–126)
Anion gap: 11 (ref 5–15)
BUN: 36 mg/dL — ABNORMAL HIGH (ref 6–20)
CO2: 21 mmol/L — ABNORMAL LOW (ref 22–32)
Calcium: 7.6 mg/dL — ABNORMAL LOW (ref 8.9–10.3)
Chloride: 101 mmol/L (ref 98–111)
Creatinine, Ser: 1.14 mg/dL (ref 0.61–1.24)
GFR calc Af Amer: >60 mL/min (ref >60)
GFR calc non Af Amer: >60 mL/min (ref >60)
Glucose, Bld: 106 mg/dL — ABNORMAL HIGH (ref 70–99)
Potassium: 4.8 mmol/L (ref 3.5–5.1)
Sodium: 133 mmol/L — ABNORMAL LOW (ref 135–145)
Total Bilirubin: 1.1 mg/dL (ref 0.3–1.2)
Total Protein: 5.8 g/dL — ABNORMAL LOW (ref 6.5–8.1)

## 2018-06-24 NOTE — Progress Notes (Signed)
Attempted MRI. Pt refused scanning, could not tolerate pain of laying on table. Attempted to call RN to inform.

## 2018-06-24 NOTE — Op Note (Signed)
NAMEJAPHETH, [name] MEDICAL RECORD [id number] ACCOUNT [id number] DATE OF BIRTH:Apr 10, 1965 FACILITY: [facility] LOCATION: [id number] PHYSICIAN:GREGORY Diamantina Providence, MD  OPERATIVE REPORT  DATE OF PROCEDURE:  06/23/2018  PREOPERATIVE DIAGNOSIS:  Left knee bacterial infection.  POSTOPERATIVE DIAGNOSIS:  Left knee bacterial infection and medial meniscal tear.  PROCEDURE:  Left knee arthroscopic incision and drainage with debridement and partial medial meniscectomy.  SURGEON:  Cammy Copa, MD  ASSISTANT:  Enrique Sack, RNFA  INDICATIONS:  The patient is a 53 year old patient with left knee pain.  Aspiration yesterday demonstrated a few gram-positive cocci within the aspirate.  Presents now for operative management after explanation of risks and benefits.  PROCEDURE IN DETAIL:  The patient was brought to the operating room where general anesthetic was induced.  Perioperative IV antibiotics were maintained.  Left leg was prescrubbed with alcohol and Betadine and allowed to air dry, prepped with DuraPrep  solution, and draped in a sterile manner.  Anterior inferior lateral portal was established, anterior inferior medial portal was established under direct visualization.  A cloudy fluid was present, and this was sent for culture.  Debridement was  performed and a medial meniscal tear was noted.  The meniscal tear was debrided back to a stable rim.  It involved about 70% of the anterior, posterior width of the meniscus.  Overall, minimal chondral changes were noted in the medial, lateral and  patellofemoral compartments.  Using a shaver, debridement was performed of the synovial surfaces.  Thorough irrigation with 12 L of irrigating solution was performed.  At this time, Stimulan beads consisting of gentamicin and vancomycin were made and  placed through a superior lateral portal, which was created in order to facilitate irrigation and debridement.  These Stimulan beads were placed, and the Hemovac drain was  then placed as well.  The portals were closed using 3-0 nylon.  At this time, a  solution of Marcaine, morphine, clonidine was injected into the knee.  Suction was clamped.  At this time, the knee was wrapped with an Ace wrap.  The patient tolerated the procedure well without immediate complications and transferred to the recovery  room in stable condition.  LN/NUANCE  D:06/23/2018 T:06/23/2018 JOB:[id number]

## 2018-06-24 NOTE — Progress Notes (Signed)
Subjective: 1 Day Post-Op Procedure(s) (LRB): LEFT KNEE ARTHROSCOPY KNEE I&D. (Left) Patient reports pain as moderate.  Tolerated surgery on his left knee yesterday by Dr. August Saucer.  Reports less left knee pain overall and he appears comfortable.  Objective: Vital signs in last 24 hours: Temp:  [98 F (36.7 C)-99 F (37.2 C)] 99 F (37.2 C) (05/16 0830) Pulse Rate:  [107-120] 107 (05/16 0830) Resp:  [16-30] 16 (05/16 0830) BP: (112-140)/(76-93) 123/80 (05/16 0830) SpO2:  [94 %-99 %] 99 % (05/16 0830) Weight:  [74.8 kg] 74.8 kg (05/15 1708)  Intake/Output from previous day: 05/15 0701 - 05/16 0700 In: 3449.8 [P.O.:120; I.V.:2829.8; IV Piggyback:500] Out: 1900 [Urine:1900] Intake/Output this shift: No intake/output data recorded.  Recent Labs    06/23/18 0336 06/24/18 0427  HGB 10.3* 9.8*   Recent Labs    06/23/18 0336 06/24/18 0427  WBC 46.6* 46.1*  RBC 3.35* 3.14*  HCT 30.4* 28.4*  PLT 323 320   Recent Labs    06/23/18 0336 06/24/18 0427  NA 131* 133*  K 4.5 4.8  CL 101 101  CO2 21* 21*  BUN 46* 36*  CREATININE 1.27* 1.14  GLUCOSE 111* 106*  CALCIUM 7.3* 7.6*   No results for input(s): LABPT, INR in the last 72 hours.  Sensation intact distally Intact pulses distally Dorsiflexion/Plantar flexion intact Incision: dressing C/D/I Compartment soft   Assessment/Plan: 1 Day Post-Op Procedure(s) (LRB): LEFT KNEE ARTHROSCOPY KNEE I&D. (Left) He can put full weight as tolerated on his left leg. I saw that Dr. August Saucer has ordered a right shoulder MRI and a left elbow MRI to assess for other possible infectious sources.  Will follow up on these.      Kathryne Hitch 06/24/2018, 8:39 AM

## 2018-06-24 NOTE — Progress Notes (Signed)
Hemovac removed this a.m. MRI shoulder and elbow pending Okay weight-bear as tolerated left leg In general patient feels better this morning.

## 2018-06-24 NOTE — Progress Notes (Addendum)
Patient ID: [name], male   DOB: Apr 13, 1965, 53 y.o.   MRN: [id number]         [facility] for Infectious Disease  Date of Admission:  06/19/2018           Day 5 ceftriaxone        Day 2 vancomycin ASSESSMENT: He has septic arthritis of his left knee and probable septic arthritis of his left elbow and right shoulder.  I suspect that he had seeding from the splinter injury on his left index finger but it could have been from any of his recent skin abrasions.  Admission blood cultures were negative and he does not have any clinical or TTE evidence of endocarditis.  I do not feel that we need to pursue a TEE.  Synovial fluid Gram stain suggest staph or strep.  I will continue vancomycin alone pending final culture results.  I told him he will probably need several weeks of IV antibiotics, possibly followed by several weeks of oral antibiotics.  PLAN: 1. Continue vancomycin pending final cultures 2. Discontinue ceftriaxone 3. Await MRI results  Principal Problem:   Infection of left knee (HCC) Active Problems:   Acute medial meniscal tear, left, initial encounter   Effusion of left elbow   Recurrent boils   Hepatitis C antibody test positive   Cigarette smoker   Intractable back pain   AKI (acute kidney injury) (HCC)   Spinal stenosis of lumbosacral region   GERD (gastroesophageal reflux disease)   Mild protein-calorie malnutrition (HCC)   Polysubstance abuse (HCC)   Normocytic anemia   Scheduled Meds: . bupivacaine  10 mL Infiltration Once  . docusate sodium  100 mg Oral BID  . folic acid  1 mg Oral Daily  . methocarbamol  500 mg Oral TID  . multivitamin with minerals  1 tablet Oral Daily  . pantoprazole  40 mg Oral BID AC  . thiamine  100 mg Oral Daily   Or  . thiamine  100 mg Intravenous Daily  . traMADol  50 mg Oral Q6H   Continuous Infusions: . cefTRIAXone (ROCEPHIN)  IV 2 g (06/24/18 0944)  . lactated ringers 10 mL/hr at 06/23/18 1710  . methocarbamol  (ROBAXIN) IV    . 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  About 2 weeks ago he got a splinter in his left index finger when he pulled a piece of pressure-treated plywood out from under a house.  He removed the splinter and clean the area.  About 1 week ago he helped his father unload a riding lawnmower off of a trailer and had sudden onset of severe back pain and right leg pain.  He says that he could not get any relief of the pain and relapsed with crack cocaine use.  He denies ever injecting drugs.  He said at some point in the past few weeks he also scraped the back of his right hand on some cinderblock and somehow got an abrasion on his left elbow.  Over the past week he has developed chills, sweats and pain in his left elbow, left knee and right shoulder leading to admission 5 days ago.  He underwent arthrocentesis of his left knee.  Gram stain showed gram-positive cocci in pairs and chains and cultures  are pending at 48 hours.  Yesterday he underwent washout of his left knee.  Operative Gram stain shows gram-positive cocci and cultures are pending.  MRI of his left elbow and right shoulder are pending.  Blood cultures are negative.  No vegetations were seen on TTE but it was a technically difficult study.  He lives with his father in [place].  Review of Systems: Review of Systems  Constitutional: Positive for chills, diaphoresis and malaise/fatigue. Negative for fever and weight loss.  Respiratory: Positive for cough. Negative for sputum production and shortness of breath.   Cardiovascular: Negative for chest pain.  Gastrointestinal: Negative for  abdominal pain, diarrhea, nausea and vomiting.  Musculoskeletal: Positive for back pain and joint pain.  Psychiatric/Behavioral: Positive for substance abuse.    No Known Allergies  OBJECTIVE: Vitals:   06/23/18 2326 06/24/18 0415 06/24/18 0830 06/24/18 1117  BP: 131/88 130/76 123/80 131/84  Pulse: (!) 120 (!) 117 (!) 107 (!) 106  Resp: Temp: 98.6 F (37 C) 98.9 F (37.2 C) 99 F (37.2 C) 98.9 F (37.2 C)  TempSrc: Oral Oral Oral Oral  SpO2: 99% 97% 99% 98%  Weight:      Height:       Body mass index is 23.68 kg/m.  Physical Exam Constitutional:      Comments: He is resting quietly in bed.  He became tearful when talking about his drug use.  Cardiovascular:     Rate and Rhythm: Normal rate and regular rhythm.     Heart sounds: No murmur.  Pulmonary:     Effort: Pulmonary effort is normal.     Breath sounds: Normal breath sounds.  Abdominal:     Palpations: Abdomen is soft.     Tenderness: There is no abdominal tenderness.  Musculoskeletal:     Comments: Slight warmth and swelling of his left knee, left elbow and right shoulder.  Skin:    Comments: 2 scabbed abrasions on the dorsum of his right hand and wrist.  1 scabbed abrasion on his left elbow.  A superficial necrotic area on his left index finger where the splinter was.  Psychiatric:        Mood and Affect: Mood normal.     Lab Results Lab Results  Component Value Date   WBC 46.1 (H) 06/24/2018   HGB 9.8 (L) 06/24/2018   HCT 28.4 (L) 06/24/2018   MCV 90.4 06/24/2018   PLT 320 06/24/2018    Lab Results  Component Value Date   CREATININE 1.14 06/24/2018   BUN 36 (H) 06/24/2018   NA 133 (L) 06/24/2018   K 4.8 06/24/2018   CL 101 06/24/2018   CO2 21 (L) 06/24/2018    Lab Results  Component Value Date   ALT 27 06/24/2018   AST 24 06/24/2018   ALKPHOS 94 06/24/2018   BILITOT 1.1 06/24/2018     Microbiology: Recent Results (from the past 240 hour(s))  SARS Coronavirus 2 [facility]  order, Performed in [facility] hospital lab)     Status: None   Collection Time: 06/19/18  2:12 PM  Result Value Ref Range Status   SARS Coronavirus 2 NEGATIVE NEGATIVE Final    Comment: (NOTE) If result is NEGATIVE SARS-CoV-2 target nucleic acids are NOT DETECTED. The SARS-CoV-2 RNA is generally detectable in upper and lower  respiratory specimens during the acute phase of infection. The lowest  concentration of SARS-CoV-2 viral copies this assay can detect is 250  copies / mL.  A negative result does not preclude SARS-CoV-2 infection  and should not be used as the sole basis for treatment or other  patient management decisions.  A negative result may occur with  improper specimen collection / handling, submission of specimen other  than nasopharyngeal swab, presence of viral mutation(s) within the  areas targeted by this assay, and inadequate number of viral copies  (<250 copies / mL). A negative result must be combined with clinical  observations, patient history, and epidemiological information. If result is POSITIVE SARS-CoV-2 target nucleic acids are DETECTED. The SARS-CoV-2 RNA is generally detectable in upper and lower  respiratory specimens dur ing the acute phase of infection.  Positive  results are indicative of active infection with SARS-CoV-2.  Clinical  correlation with patient history and other diagnostic information is  necessary to determine patient infection status.  Positive results do  not rule out bacterial infection or co-infection with other viruses. If result is PRESUMPTIVE POSTIVE SARS-CoV-2 nucleic acids MAY BE PRESENT.   A presumptive positive result was obtained on the submitted specimen  and confirmed on repeat testing.  While 2019 novel coronavirus  (SARS-CoV-2) nucleic acids may be present in the submitted sample  additional confirmatory testing may be necessary for epidemiological  and / or clinical management purposes  to differentiate between   SARS-CoV-2 and other Sarbecovirus currently known to infect humans.  If clinically indicated additional testing with an alternate test  methodology [phone]) is advised. The SARS-CoV-2 RNA is generally  detectable in upper and lower respiratory sp ecimens during the acute  phase of infection. The expected result is Negative. Fact Sheet for Patients:  BoilerBrush.com.cy Fact Sheet for Healthcare Providers: https://pope.com/ This test is not yet approved or cleared by the [place] FDA and has been authorized for detection and/or diagnosis of SARS-CoV-2 by FDA under an Emergency Use Authorization (EUA).  This EUA will remain in effect (meaning this test can be used) for the duration of the COVID-19 declaration under Section 564(b)(1) of the Act, 21 U.S.C. section 360bbb-3(b)(1), unless the authorization is terminated or revoked sooner. Performed at [facility], [address], [place], [place] [address]   Culture, blood (routine x 2)     Status: None (Preliminary result)   Collection Time: 06/20/18  9:00 AM  Result Value Ref Range Status   Specimen Description BLOOD RIGHT HAND  Final   Special Requests   Final    BOTTLES DRAWN AEROBIC ONLY Blood Culture results may not be optimal due to an inadequate volume of blood received in culture bottles   Culture   Final    NO GROWTH 4 DAYS Performed at [facility], [address] [place], [place] [address]    Report Status PENDING  Incomplete  Culture, blood (routine x 2)     Status: None (Preliminary result)   Collection Time: 06/20/18  9:10 AM  Result Value Ref Range Status   Specimen Description BLOOD LEFT HAND  Final   Special Requests   Final    BOTTLES DRAWN AEROBIC ONLY Blood Culture adequate volume   Culture   Final    NO GROWTH 4 DAYS Performed at [facility], [address], [place], [place] [address]    Report Status PENDING  Incomplete  Body fluid culture      Status: None (Preliminary result)   Collection Time: 06/22/18  1:05 PM  Result Value Ref Range Status   Specimen Description SYNOVIAL  Final   Special Requests  KNEE  Final   Gram Stain   Final    MODERATE WBC PRESENT, PREDOMINANTLY PMN RARE GRAM POSITIVE COCCI IN PAIRS AND CHAINS    Culture   Final    CULTURE REINCUBATED FOR BETTER GROWTH Performed at [facility], [address] [place], [place] [address]    Report Status PENDING  Incomplete  Surgical pcr screen     Status: Abnormal   Collection Time: 06/23/18  4:40 PM  Result Value Ref Range Status   MRSA, PCR NEGATIVE NEGATIVE Final   Staphylococcus aureus POSITIVE (A) NEGATIVE Final    Comment: (NOTE) The Xpert SA Assay (FDA approved for NASAL specimens in patients 822 years of age and older), is one component of a comprehensive surveillance program. It is not intended to diagnose infection nor to guide or monitor treatment. Performed at [facility], [address] [place], [place] [address]   Aerobic/Anaerobic Culture (surgical/deep wound)     Status: None (Preliminary result)   Collection Time: 06/23/18  6:53 PM  Result Value Ref Range Status   Specimen Description SYNOVIAL LEFT KNEE  Final   Special Requests SWABS  Final   Gram Stain   Final    ABUNDANT WBC PRESENT, PREDOMINANTLY PMN RARE GRAM POSITIVE COCCI Performed at [facility], [address] [place], [place] [address]    Culture PENDING  Incomplete   Report Status PENDING  Incomplete    Cliffton AstersJohn Infantof Villagomez, MD [facility] [phone] pager   [phone] cell 06/24/2018, 2:21 PM

## 2018-06-24 NOTE — Progress Notes (Signed)
PROGRESS NOTE  Carlton Macer LMB:[id number] DOB: [birth year] DOA: 06/19/2018 PCP: Patient, No Pcp Per   LOS: 4 days   Brief Narrative / Interim history: 53 year old male with history of hep C and substance abuse who was admitted to the hospital on 06/18/2017 with complaints of back pain as well as right upper extremity cellulitis.  Subjective: His left knee feels a lot better this morning, his pain has decreased significantly.  He has not tried to walk yet but he is confident.  He denies any fevers or chills.  Assessment & Plan: Principal Problem:   Infection of left knee (HCC) Active Problems:   Hepatitis C antibody test positive   Cigarette smoker   Intractable back pain   AKI (acute kidney injury) (HCC)   Spinal stenosis of lumbosacral region   GERD (gastroesophageal reflux disease)   Mild protein-calorie malnutrition (HCC)   Acute medial meniscal tear, left, initial encounter   Polysubstance abuse (HCC)   Normocytic anemia   Effusion of left elbow   Principal Problem Left upper extremity cellulitis/sepsis complicated by superficial thrombosis and acute toxic metabolic encephalopathy -Blood cultures remain negative, he is on vancomycin and ceftriaxone, continue -Right upper extremity Doppler was negative for DVT but did show chronic superficial thrombosis, arterial duplex did not show any obstruction  Active Problems Septic arthritis in left knee  -Orthopedic surgery consulted, patient underwent knee aspiration which showed 19 K WBC with 81% neutrophils, but regardless Gram stain was positive for gram-positive's -Orthopedic surgery took patient for washout on 5/15, cultures are pending -ID consulted as well, appreciate input.  Given high suspicion for bacteremia he underwent a 2D echo which did not show clear-cut evidence of vegetations.  Left elbow pain and swelling, right shoulder pain -Concern for septic arthritis, MRI of these areas pending  Acute kidney injury  -Patient's creatinine on admission was 3.5, likely in the setting of sepsis, improved with IV fluids & down to normal limits today  Alcohol/polysubstance abuse -He is not triggering significantly, patient tells me that he has not had anything to drink in the past 2 weeks.  He also denies recent drugs but per daughter this is not true -determined to quit smoking however  Tractable back pain secondary to severe lumbosacral spinal stenosis -MRI completed on 06/13/2018, neuro-surgery also evaluated  Hyponatremia -Resolving with IV fluids  Protein calorie malnutrition/hypoalbuminemia underweight  Scheduled Meds: . bupivacaine  10 mL Infiltration Once  . docusate sodium  100 mg Oral BID  . folic acid  1 mg Oral Daily  . methocarbamol  500 mg Oral TID  . multivitamin with minerals  1 tablet Oral Daily  . pantoprazole  40 mg Oral BID AC  . thiamine  100 mg Oral Daily   Or  . thiamine  100 mg Intravenous Daily  . traMADol  50 mg Oral Q6H   Continuous Infusions: . cefTRIAXone (ROCEPHIN)  IV 2 g (06/24/18 0944)  . lactated ringers 10 mL/hr at 06/23/18 1710  . methocarbamol (ROBAXIN) IV    . vancomycin     PRN Meds:.acetaminophen **OR** acetaminophen, alum & mag hydroxide-simeth, HYDROcodone-acetaminophen, methocarbamol **OR** methocarbamol (ROBAXIN) IV, metoCLOPramide **OR** metoCLOPramide (REGLAN) injection, morphine injection, ondansetron **OR** ondansetron (ZOFRAN) IV, polyethylene glycol  DVT prophylaxis: SCDs Code Status: Full code Family Communication: No family at bedside, discussed with daughter over the phone Disposition Plan: To be determined  Consultants:   Neurosurgery  Orthopedic surgery  Infectious disease  Procedures:   Left knee aspiration 5/15  Antimicrobials:  Ceftriaxone 5/12 >>  Vancomycin 5/15 >>  Objective: Vitals:   06/23/18 2326 06/24/18 0415 06/24/18 0830 06/24/18 1117  BP: 131/88 130/76 123/80 131/84  Pulse: (!) 120 (!) 117 (!) 107 (!) 106   Resp: 18 16 16 17   Temp: 98.6 F (37 C) 98.9 F (37.2 C) 99 F (37.2 C) 98.9 F (37.2 C)  TempSrc: Oral Oral Oral Oral  SpO2: 99% 97% 99% 98%  Weight:      Height:        Intake/Output Summary (Last 24 hours) at 06/24/2018 1354 Last data filed at 06/24/2018 0421 Gross per 24 hour  Intake 3329.82 ml  Output 950 ml  Net 2379.82 ml   Filed Weights   06/19/18 1303 06/23/18 1708  Weight: 74.8 kg 74.8 kg    Examination:  Constitutional: No distress Eyes: No scleral icterus ENMT: Moist membranes Neck: normal, supple, no masses Respiratory: Clear to auscultation bilaterally, no wheezing or crackles heard.  Normal respiratory effort Cardiovascular: Regular rate and rhythm, no murmurs.  No peripheral edema Abdomen: Soft, nontender, nondistended, positive bowel sounds Musculoskeletal: no clubbing / cyanosis.  Skin: Swelling in the left elbow, mild surrounding erythematous rash on anterolateral aspect of the forearm.  Necrotic appearing well demarcated lesion on left index.  No new rashes.  Left knee Ace wrapped Neurologic: No focal deficits, equal strength Psychiatric: Normal judgment and insight. Alert and oriented x 3. Normal mood.    Data Reviewed: I have independently reviewed following labs and imaging studies   CBC: Recent Labs  Lab 06/19/18 1447 06/20/18 0445 06/21/18 0340 06/23/18 0336 06/24/18 0427  WBC 18.9* 24.0* 30.2* 46.6* 46.1*  NEUTROABS  --   --  29.0*  --   --   HGB 12.6* 11.6* 9.8* 10.3* 9.8*  HCT 34.5* 31.9* 27.4* 30.4* 28.4*  MCV 86.5 85.8 88.1 90.7 90.4  PLT 299 297 307 323 320   Basic Metabolic Panel: Recent Labs  Lab 06/19/18 1447 06/20/18 0445 06/20/18 0912 06/21/18 0340 06/23/18 0336 06/24/18 0427  NA 122* 125* 127* 131* 131* 133*  K 3.3* 3.9 3.9 4.0 4.5 4.8  CL 85* 86* 94* 101 101 101  CO2 22 19* 21* 21* 21* 21*  GLUCOSE 153* 142* 140* 138* 111* 106*  BUN 120* 116* 109* 86* 46* 36*  CREATININE 3.56* 2.76* 2.55* 1.94* 1.27* 1.14   CALCIUM 7.4* 7.7* 7.7* 7.7* 7.3* 7.6*  MG 2.8*  --  2.6* 2.7*  --   --   PHOS  --   --  4.0  --   --   --    GFR: Estimated Creatinine Clearance: 77.4 mL/min (by C-G formula based on SCr of 1.14 mg/dL). Liver Function Tests: Recent Labs  Lab 06/20/18 0912 06/21/18 0340 06/24/18 0427  AST 58* 43* 24  ALT 49* 37 27  ALKPHOS 82 80 94  BILITOT 1.1 0.9 1.1  PROT 6.0* 5.7* 5.8*  ALBUMIN 1.3* 1.1* 1.1*   No results for input(s): LIPASE, AMYLASE in the last 168 hours. No results for input(s): AMMONIA in the last 168 hours. Coagulation Profile: No results for input(s): INR, PROTIME in the last 168 hours. Cardiac Enzymes: Recent Labs  Lab 06/20/18 0912 06/21/18 0340  CKTOTAL 103 85   BNP (last 3 results) No results for input(s): PROBNP in the last 8760 hours. HbA1C: No results for input(s): HGBA1C in the last 72 hours. CBG: No results for input(s): GLUCAP in the last 168 hours. Lipid Profile: No results for input(s): CHOL, HDL, LDLCALC, TRIG, CHOLHDL, LDLDIRECT in  the last 72 hours. Thyroid Function Tests: No results for input(s): TSH, T4TOTAL, FREET4, T3FREE, THYROIDAB in the last 72 hours. Anemia Panel: No results for input(s): VITAMINB12, FOLATE, FERRITIN, TIBC, IRON, RETICCTPCT in the last 72 hours. Urine analysis:    Component Value Date/Time   COLORURINE YELLOW 06/19/2018 1944   APPEARANCEUR HAZY (A) 06/19/2018 1944   LABSPEC 1.014 06/19/2018 1944   PHURINE 5.0 06/19/2018 1944   GLUCOSEU 50 (A) 06/19/2018 1944   HGBUR MODERATE (A) 06/19/2018 1944   BILIRUBINUR NEGATIVE 06/19/2018 1944   KETONESUR NEGATIVE 06/19/2018 1944   PROTEINUR NEGATIVE 06/19/2018 1944   NITRITE NEGATIVE 06/19/2018 1944   LEUKOCYTESUR NEGATIVE 06/19/2018 1944   Sepsis Labs: Invalid input(s): PROCALCITONIN, LACTICIDVEN  Recent Results (from the past 240 hour(s))  SARS Coronavirus 2 [facility] order, Performed in [facility] hospital lab)     Status: None   Collection Time: 06/19/18  2:12  PM  Result Value Ref Range Status   SARS Coronavirus 2 NEGATIVE NEGATIVE Final    Comment: (NOTE) If result is NEGATIVE SARS-CoV-2 target nucleic acids are NOT DETECTED. The SARS-CoV-2 RNA is generally detectable in upper and lower  respiratory specimens during the acute phase of infection. The lowest  concentration of SARS-CoV-2 viral copies this assay can detect is 250  copies / mL. A negative result does not preclude SARS-CoV-2 infection  and should not be used as the sole basis for treatment or other  patient management decisions.  A negative result may occur with  improper specimen collection / handling, submission of specimen other  than nasopharyngeal swab, presence of viral mutation(s) within the  areas targeted by this assay, and inadequate number of viral copies  (<250 copies / mL). A negative result must be combined with clinical  observations, patient history, and epidemiological information. If result is POSITIVE SARS-CoV-2 target nucleic acids are DETECTED. The SARS-CoV-2 RNA is generally detectable in upper and lower  respiratory specimens dur ing the acute phase of infection.  Positive  results are indicative of active infection with SARS-CoV-2.  Clinical  correlation with patient history and other diagnostic information is  necessary to determine patient infection status.  Positive results do  not rule out bacterial infection or co-infection with other viruses. If result is PRESUMPTIVE POSTIVE SARS-CoV-2 nucleic acids MAY BE PRESENT.   A presumptive positive result was obtained on the submitted specimen  and confirmed on repeat testing.  While 2019 novel coronavirus  (SARS-CoV-2) nucleic acids may be present in the submitted sample  additional confirmatory testing may be necessary for epidemiological  and / or clinical management purposes  to differentiate between  SARS-CoV-2 and other Sarbecovirus currently known to infect humans.  If clinically indicated  additional testing with an alternate test  methodology [phone]) is advised. The SARS-CoV-2 RNA is generally  detectable in upper and lower respiratory sp ecimens during the acute  phase of infection. The expected result is Negative. Fact Sheet for Patients:  BoilerBrush.com.cy Fact Sheet for Healthcare Providers: https://pope.com/ This test is not yet approved or cleared by the [place] FDA and has been authorized for detection and/or diagnosis of SARS-CoV-2 by FDA under an Emergency Use Authorization (EUA).  This EUA will remain in effect (meaning this test can be used) for the duration of the COVID-19 declaration under Section 564(b)(1) of the Act, 21 U.S.C. section 360bbb-3(b)(1), unless the authorization is terminated or revoked sooner. Performed at [facility], [address], [place], [place] [address]   Culture, blood (routine x  2)     Status: None (Preliminary result)   Collection Time: 06/20/18  9:00 AM  Result Value Ref Range Status   Specimen Description BLOOD RIGHT HAND  Final   Special Requests   Final    BOTTLES DRAWN AEROBIC ONLY Blood Culture results may not be optimal due to an inadequate volume of blood received in culture bottles   Culture   Final    NO GROWTH 4 DAYS Performed at [facility], [address] [place], [place] [address]    Report Status PENDING  Incomplete  Culture, blood (routine x 2)     Status: None (Preliminary result)   Collection Time: 06/20/18  9:10 AM  Result Value Ref Range Status   Specimen Description BLOOD LEFT HAND  Final   Special Requests   Final    BOTTLES DRAWN AEROBIC ONLY Blood Culture adequate volume   Culture   Final    NO GROWTH 4 DAYS Performed at [facility], [address] [place], [place] [address]    Report Status PENDING  Incomplete  Body fluid culture     Status: None (Preliminary result)   Collection Time: 06/22/18  1:05 PM  Result Value Ref  Range Status   Specimen Description SYNOVIAL  Final   Special Requests KNEE  Final   Gram Stain   Final    MODERATE WBC PRESENT, PREDOMINANTLY PMN RARE GRAM POSITIVE COCCI IN PAIRS AND CHAINS    Culture   Final    CULTURE REINCUBATED FOR BETTER GROWTH Performed at [facility], [address] [place], [place] [address]    Report Status PENDING  Incomplete  Surgical pcr screen     Status: Abnormal   Collection Time: 06/23/18  4:40 PM  Result Value Ref Range Status   MRSA, PCR NEGATIVE NEGATIVE Final   Staphylococcus aureus POSITIVE (A) NEGATIVE Final    Comment: (NOTE) The Xpert SA Assay (FDA approved for NASAL specimens in patients 72 years of age and older), is one component of a comprehensive surveillance program. It is not intended to diagnose infection nor to guide or monitor treatment. Performed at [facility], [address], [place], [place] [address]   Aerobic/Anaerobic Culture (surgical/deep wound)     Status: None (Preliminary result)   Collection Time: 06/23/18  6:53 PM  Result Value Ref Range Status   Specimen Description SYNOVIAL LEFT KNEE  Final   Special Requests SWABS  Final   Gram Stain   Final    ABUNDANT WBC PRESENT, PREDOMINANTLY PMN RARE GRAM POSITIVE COCCI Performed at [facility], [address], [place], [place] [address]    Culture PENDING  Incomplete   Report Status PENDING  Incomplete      Radiology Studies: No results found.  Pamella Pert, MD, PhD [facility]  Contact via  [email or web address]  [facility] Office Info P: [phone]  F: [phone]

## 2018-06-25 ENCOUNTER — Inpatient Hospital Stay: Payer: Self-pay

## 2018-06-25 DIAGNOSIS — F1910 Other psychoactive substance abuse, uncomplicated: Secondary | ICD-10-CM

## 2018-06-25 DIAGNOSIS — R12 Heartburn: Secondary | ICD-10-CM

## 2018-06-25 LAB — CBC
HCT: 28.8 % — ABNORMAL LOW (ref 39.0–52.0)
Hemoglobin: 9.5 g/dL — ABNORMAL LOW (ref 13.0–17.0)
MCH: 30.4 pg (ref 26.0–34.0)
MCHC: 33 g/dL (ref 30.0–36.0)
MCV: 92 fL (ref 80.0–100.0)
Platelets: 383 10*3/uL (ref 150–400)
RBC: 3.13 MIL/uL — ABNORMAL LOW (ref 4.22–5.81)
RDW: 15.9 % — ABNORMAL HIGH (ref 11.5–15.5)
WBC: 34.5 10*3/uL — ABNORMAL HIGH (ref 4.0–10.5)
nRBC: 0 % (ref 0.0–0.2)

## 2018-06-25 LAB — CULTURE, BLOOD (ROUTINE X 2)
Culture: NO GROWTH
Culture: NO GROWTH
Special Requests: ADEQUATE

## 2018-06-25 LAB — BASIC METABOLIC PANEL
Anion gap: 5 (ref 5–15)
BUN: 33 mg/dL — ABNORMAL HIGH (ref 6–20)
CO2: 24 mmol/L (ref 22–32)
Calcium: 7.7 mg/dL — ABNORMAL LOW (ref 8.9–10.3)
Chloride: 101 mmol/L (ref 98–111)
Creatinine, Ser: 1 mg/dL (ref 0.61–1.24)
GFR calc Af Amer: >60 mL/min (ref >60)
GFR calc non Af Amer: >60 mL/min (ref >60)
Glucose, Bld: 90 mg/dL (ref 70–99)
Potassium: 4.4 mmol/L (ref 3.5–5.1)
Sodium: 130 mmol/L — ABNORMAL LOW (ref 135–145)

## 2018-06-25 LAB — HIV-1 RNA QUANT-NO REFLEX-BLD
HIV 1 RNA Quant: <20 copies/mL
LOG10 HIV-1 RNA: UNDETERMINED log10copy/mL

## 2018-06-25 MED ORDER — ENOXAPARIN SODIUM 40 MG/0.4ML ~~LOC~~ SOLN
40.0000 mg | SUBCUTANEOUS | Status: DC
  Administered 2018-06-25 – 2018-06-27 (×3): 40 mg via SUBCUTANEOUS
  Filled 2018-06-25 (×5): qty 0.4

## 2018-06-25 MED ORDER — SUCRALFATE 1 GM/10ML PO SUSP
1.0000 g | Freq: Three times a day (TID) | ORAL | Status: DC
  Administered 2018-06-25 – 2018-07-10 (×4): 1 g via ORAL
  Filled 2018-06-25 (×22): qty 10

## 2018-06-25 MED ORDER — ESOMEPRAZOLE MAGNESIUM 20 MG PO CPDR
20.0000 mg | DELAYED_RELEASE_CAPSULE | Freq: Every day | ORAL | Status: DC
  Administered 2018-06-25 – 2018-08-10 (×47): 20 mg via ORAL
  Filled 2018-06-25 (×49): qty 1

## 2018-06-25 NOTE — Progress Notes (Signed)
PROGRESS NOTE  [name] WUJ:[id number] DOB: April 22, 1965 DOA: 06/19/2018 PCP: Patient, No Pcp Per   LOS: 5 days   Brief Narrative / Interim history: 53 year old male with history of hep C and substance abuse who was admitted to the hospital on 06/18/2017 with complaints of back pain as well as right upper extremity cellulitis.  Subjective: His left elbow and right shoulder seem to have improved compared to yesterday, tells me his redness and swelling on the left elbow are completely gone now.  Denies any chest pain, abdominal pain, nausea or vomiting and overall he feels really well  Assessment & Plan: Principal Problem:   Infection of left knee (HCC) Active Problems:   Hepatitis C antibody test positive   Cigarette smoker   Intractable back pain   AKI (acute kidney injury) (HCC)   Spinal stenosis of lumbosacral region   GERD (gastroesophageal reflux disease)   Mild protein-calorie malnutrition (HCC)   Acute medial meniscal tear, left, initial encounter   Polysubstance abuse (HCC)   Normocytic anemia   Effusion of left elbow   Recurrent boils   Principal Problem Left upper extremity cellulitis/sepsis complicated by superficial thrombosis and acute toxic metabolic encephalopathy -Blood cultures remain negative, he is on vancomycin and ceftriaxone, continue -Right upper extremity Doppler was negative for DVT but did show chronic superficial thrombosis, arterial duplex did not show any obstruction -Wound cultures are still pending  Active Problems Septic arthritis in left knee  -Orthopedic surgery consulted, patient underwent knee aspiration which showed 19 K WBC with 81% neutrophils, but regardless Gram stain was positive for gram-positive's -Orthopedic surgery took patient for washout on 5/15, cultures are pending -ID consulted as well, appreciate input.  Given high suspicion for bacteremia he underwent a 2D echo which did not show clear-cut evidence of vegetations. -Wound  cultures are still pending  Left elbow pain and swelling, right shoulder pain -Concern for septic arthritis, patient could not tolerate MRI this morning.  Given significant improvement in the range of motion as well as no swelling and no erythema, I have discussed with orthopedic surgery as well as infectious disease, will not pursue further imaging   Acute kidney injury -Patient's creatinine on admission was 3.5, likely in the setting of sepsis -Creatinine has normalized today.  He is eating and drinking well, will discontinue IV fluids  Alcohol/polysubstance abuse -He is not triggering significantly, patient tells me that he has not had anything to drink in the past 2 weeks.  He also denies recent drugs but per daughter this is not true -determined to quit smoking however  Intractable back pain secondary to severe lumbosacral spinal stenosis -MRI completed on 06/13/2018, neuro-surgery also evaluated  Hyponatremia -Mild, overall stable  Protein calorie malnutrition/hypoalbuminemia underweight  Scheduled Meds: . bupivacaine  10 mL Infiltration Once  . docusate sodium  100 mg Oral BID  . enoxaparin (LOVENOX) injection  40 mg Subcutaneous Q24H  . folic acid  1 mg Oral Daily  . methocarbamol  500 mg Oral TID  . multivitamin with minerals  1 tablet Oral Daily  . pantoprazole  40 mg Oral BID AC  . sucralfate  1 g Oral TID WC & HS  . thiamine  100 mg Oral Daily   Or  . thiamine  100 mg Intravenous Daily  . traMADol  50 mg Oral Q6H   Continuous Infusions: . lactated ringers 10 mL/hr at 06/23/18 1710  . methocarbamol (ROBAXIN) IV    . vancomycin 1,500 mg (06/24/18 1402)  PRN Meds:.acetaminophen **OR** acetaminophen, alum & mag hydroxide-simeth, HYDROcodone-acetaminophen, methocarbamol **OR** methocarbamol (ROBAXIN) IV, metoCLOPramide **OR** metoCLOPramide (REGLAN) injection, morphine injection, ondansetron **OR** ondansetron (ZOFRAN) IV, polyethylene glycol  DVT prophylaxis: SCDs  Code Status: Full code Family Communication: No family at bedside, discussed with daughter over the phone Disposition Plan: To be determined  Consultants:   Neurosurgery  Orthopedic surgery  Infectious disease  Procedures:   Left knee aspiration 5/15  Antimicrobials:  Ceftriaxone 5/12 >>  Vancomycin 5/15 >>  Objective: Vitals:   06/24/18 2117 06/24/18 2356 06/25/18 0413 06/25/18 0804  BP: 135/88 129/68 132/72 121/65  Pulse: (!) 113 (!) 111 99 (!) 107  Resp:  17 18 20   Temp:  98 F (36.7 C) 97.8 F (36.6 C) 98 F (36.7 C)  TempSrc:  Oral Oral Oral  SpO2: 97% 99% 100% 98%  Weight:      Height:        Intake/Output Summary (Last 24 hours) at 06/25/2018 1059 Last data filed at 06/25/2018 0414 Gross per 24 hour  Intake -  Output 2050 ml  Net -2050 ml   Filed Weights   06/19/18 1303 06/23/18 1708  Weight: 74.8 kg 74.8 kg    Examination:  Constitutional: No distress, appears comfortable laying in bed Eyes: No icterus ENMT: Moist mucous membranes Neck: normal, supple, no masses Respiratory: Clear to auscultation bilaterally without wheezing or crackles.  Normal respiratory effort Cardiovascular: Regular rate and rhythm, no murmurs appreciated.  No peripheral edema Abdomen: Soft, nontender, nondistended, positive bowel sounds Musculoskeletal: no clubbing / cyanosis.  Skin: Left elbow swelling completely resolved, no erythema, good range of motion.  Minimal tenderness on the anterior aspect of the right shoulder, full range of motion without pain Neurologic: No focal deficits, equal strength Psychiatric: Normal judgment and insight. Alert and oriented x 3. Normal mood.    Data Reviewed: I have independently reviewed following labs and imaging studies   CBC: Recent Labs  Lab 06/20/18 0445 06/21/18 0340 06/23/18 0336 06/24/18 0427 06/25/18 0400  WBC 24.0* 30.2* 46.6* 46.1* 34.5*  NEUTROABS  --  29.0*  --   --   --   HGB 11.6* 9.8* 10.3* 9.8* 9.5*  HCT  31.9* 27.4* 30.4* 28.4* 28.8*  MCV 85.8 88.1 90.7 90.4 92.0  PLT 297 307 323 320 383   Basic Metabolic Panel: Recent Labs  Lab 06/19/18 1447  06/20/18 0912 06/21/18 0340 06/23/18 0336 06/24/18 0427 06/25/18 0400  NA 122*   < > 127* 131* 131* 133* 130*  K 3.3*   < > 3.9 4.0 4.5 4.8 4.4  CL 85*   < > 94* 101 101 101 101  CO2 22   < > 21* 21* 21* 21* 24  GLUCOSE 153*   < > 140* 138* 111* 106* 90  BUN 120*   < > 109* 86* 46* 36* 33*  CREATININE 3.56*   < > 2.55* 1.94* 1.27* 1.14 1.00  CALCIUM 7.4*   < > 7.7* 7.7* 7.3* 7.6* 7.7*  MG 2.8*  --  2.6* 2.7*  --   --   --   PHOS  --   --  4.0  --   --   --   --    < > = values in this interval not displayed.   GFR: Estimated Creatinine Clearance: 88.2 mL/min (by C-G formula based on SCr of 1 mg/dL). Liver Function Tests: Recent Labs  Lab 06/20/18 0912 06/21/18 0340 06/24/18 0427  AST 58* 43* 24  ALT  49* 37 27  ALKPHOS 82 80 94  BILITOT 1.1 0.9 1.1  PROT 6.0* 5.7* 5.8*  ALBUMIN 1.3* 1.1* 1.1*   No results for input(s): LIPASE, AMYLASE in the last 168 hours. No results for input(s): AMMONIA in the last 168 hours. Coagulation Profile: No results for input(s): INR, PROTIME in the last 168 hours. Cardiac Enzymes: Recent Labs  Lab 06/20/18 0912 06/21/18 0340  CKTOTAL 103 85   BNP (last 3 results) No results for input(s): PROBNP in the last 8760 hours. HbA1C: No results for input(s): HGBA1C in the last 72 hours. CBG: No results for input(s): GLUCAP in the last 168 hours. Lipid Profile: No results for input(s): CHOL, HDL, LDLCALC, TRIG, CHOLHDL, LDLDIRECT in the last 72 hours. Thyroid Function Tests: No results for input(s): TSH, T4TOTAL, FREET4, T3FREE, THYROIDAB in the last 72 hours. Anemia Panel: No results for input(s): VITAMINB12, FOLATE, FERRITIN, TIBC, IRON, RETICCTPCT in the last 72 hours. Urine analysis:    Component Value Date/Time   COLORURINE YELLOW 06/19/2018 1944   APPEARANCEUR HAZY (A) 06/19/2018 1944    LABSPEC 1.014 06/19/2018 1944   PHURINE 5.0 06/19/2018 1944   GLUCOSEU 50 (A) 06/19/2018 1944   HGBUR MODERATE (A) 06/19/2018 1944   BILIRUBINUR NEGATIVE 06/19/2018 1944   KETONESUR NEGATIVE 06/19/2018 1944   PROTEINUR NEGATIVE 06/19/2018 1944   NITRITE NEGATIVE 06/19/2018 1944   LEUKOCYTESUR NEGATIVE 06/19/2018 1944   Sepsis Labs: Invalid input(s): PROCALCITONIN, LACTICIDVEN  Recent Results (from the past 240 hour(s))  SARS Coronavirus 2 [facility] order, Performed in [facility] hospital lab)     Status: None   Collection Time: 06/19/18  2:12 PM  Result Value Ref Range Status   SARS Coronavirus 2 NEGATIVE NEGATIVE Final    Comment: (NOTE) If result is NEGATIVE SARS-CoV-2 target nucleic acids are NOT DETECTED. The SARS-CoV-2 RNA is generally detectable in upper and lower  respiratory specimens during the acute phase of infection. The lowest  concentration of SARS-CoV-2 viral copies this assay can detect is 250  copies / mL. A negative result does not preclude SARS-CoV-2 infection  and should not be used as the sole basis for treatment or other  patient management decisions.  A negative result may occur with  improper specimen collection / handling, submission of specimen other  than nasopharyngeal swab, presence of viral mutation(s) within the  areas targeted by this assay, and inadequate number of viral copies  (<250 copies / mL). A negative result must be combined with clinical  observations, patient history, and epidemiological information. If result is POSITIVE SARS-CoV-2 target nucleic acids are DETECTED. The SARS-CoV-2 RNA is generally detectable in upper and lower  respiratory specimens dur ing the acute phase of infection.  Positive  results are indicative of active infection with SARS-CoV-2.  Clinical  correlation with patient history and other diagnostic information is  necessary to determine patient infection status.  Positive results do  not rule out bacterial  infection or co-infection with other viruses. If result is PRESUMPTIVE POSTIVE SARS-CoV-2 nucleic acids MAY BE PRESENT.   A presumptive positive result was obtained on the submitted specimen  and confirmed on repeat testing.  While 2019 novel coronavirus  (SARS-CoV-2) nucleic acids may be present in the submitted sample  additional confirmatory testing may be necessary for epidemiological  and / or clinical management purposes  to differentiate between  SARS-CoV-2 and other Sarbecovirus currently known to infect humans.  If clinically indicated additional testing with an alternate test  methodology [phone]) is advised.  The SARS-CoV-2 RNA is generally  detectable in upper and lower respiratory sp ecimens during the acute  phase of infection. The expected result is Negative. Fact Sheet for Patients:  BoilerBrush.com.cyhttps://www.fda.gov/media/136312/download Fact Sheet for Healthcare Providers: https://pope.com/https://www.fda.gov/media/136313/download This test is not yet approved or cleared by the [place] FDA and has been authorized for detection and/or diagnosis of SARS-CoV-2 by FDA under an Emergency Use Authorization (EUA).  This EUA will remain in effect (meaning this test can be used) for the duration of the COVID-19 declaration under Section 564(b)(1) of the Act, 21 U.S.C. section 360bbb-3(b)(1), unless the authorization is terminated or revoked sooner. Performed at [facility], [address] [place], [place] [address]   Culture, blood (routine x 2)     Status: None (Preliminary result)   Collection Time: 06/20/18  9:00 AM  Result Value Ref Range Status   Specimen Description BLOOD RIGHT HAND  Final   Special Requests   Final    BOTTLES DRAWN AEROBIC ONLY Blood Culture results may not be optimal due to an inadequate volume of blood received in culture bottles   Culture   Final    NO GROWTH 4 DAYS Performed at [facility], [address] [place], [place] [address]    Report Status PENDING   Incomplete  Culture, blood (routine x 2)     Status: None (Preliminary result)   Collection Time: 06/20/18  9:10 AM  Result Value Ref Range Status   Specimen Description BLOOD LEFT HAND  Final   Special Requests   Final    BOTTLES DRAWN AEROBIC ONLY Blood Culture adequate volume   Culture   Final    NO GROWTH 4 DAYS Performed at [facility], [address] [place], [place] [address]    Report Status PENDING  Incomplete  Body fluid culture     Status: None (Preliminary result)   Collection Time: 06/22/18  1:05 PM  Result Value Ref Range Status   Specimen Description SYNOVIAL  Final   Special Requests KNEE  Final   Gram Stain   Final    MODERATE WBC PRESENT, PREDOMINANTLY PMN RARE GRAM POSITIVE COCCI IN PAIRS AND CHAINS    Culture   Final    CULTURE REINCUBATED FOR BETTER GROWTH Performed at [facility], [address] [place], [place] [address]    Report Status PENDING  Incomplete  Surgical pcr screen     Status: Abnormal   Collection Time: 06/23/18  4:40 PM  Result Value Ref Range Status   MRSA, PCR NEGATIVE NEGATIVE Final   Staphylococcus aureus POSITIVE (A) NEGATIVE Final    Comment: (NOTE) The Xpert SA Assay (FDA approved for NASAL specimens in patients 54 years of age and older), is one component of a comprehensive surveillance program. It is not intended to diagnose infection nor to guide or monitor treatment. Performed at [facility], [address] [place], [place] [address]   Aerobic/Anaerobic Culture (surgical/deep wound)     Status: None (Preliminary result)   Collection Time: 06/23/18  6:53 PM  Result Value Ref Range Status   Specimen Description SYNOVIAL LEFT KNEE  Final   Special Requests SWABS  Final   Gram Stain   Final    ABUNDANT WBC PRESENT, PREDOMINANTLY PMN RARE GRAM POSITIVE COCCI    Culture   Final    NO GROWTH 2 DAYS NO ANAEROBES ISOLATED; CULTURE IN PROGRESS FOR 5 DAYS Performed at [facility], [address] [place], [place] [address]    Report Status PENDING  Incomplete      Radiology Studies: No results found.  Pamella Pert, MD, PhD [facility]  Contact via  [email or web address]  [facility] Office Info P: [phone]  F: [phone]

## 2018-06-25 NOTE — Progress Notes (Signed)
Patient ID: [name], male   DOB: 08/23/1965, 53 y.o.   MRN: [id number]         [facility]  Date of Admission:  06/19/2018           Day 6 antibiotics        Day 3 vancomycin ASSESSMENT: He has septic arthritis of his left knee with gram-positive cocci seen on Gram stain.  So far cultures are negative but were obtained while on antibiotics so it may be falsely negative.  I suspect that he had a early infection of his left elbow and right shoulder that is improving with empiric antibiotic therapy.  I plan on at least 2 weeks of IV vancomycin.  PLAN: 1. Continue vancomycin pending final cultures 2. PICC placement  Principal Problem:   Infection of left knee (HCC) Active Problems:   Acute medial meniscal tear, left, initial encounter   Effusion of left elbow   Recurrent boils   Hepatitis C antibody test positive   Cigarette smoker   Intractable back pain   AKI (acute kidney injury) (HCC)   Spinal stenosis of lumbosacral region   GERD (gastroesophageal reflux disease)   Mild protein-calorie malnutrition (HCC)   Polysubstance abuse (HCC)   Normocytic anemia   Scheduled Meds: . bupivacaine  10 mL Infiltration Once  . docusate sodium  100 mg Oral BID  . enoxaparin (LOVENOX) injection  40 mg Subcutaneous Q24H  . folic acid  1 mg Oral Daily  . methocarbamol  500 mg Oral TID  . multivitamin with minerals  1 tablet Oral Daily  . pantoprazole  40 mg Oral BID AC  . sucralfate  1 g Oral TID WC & HS  . thiamine  100 mg Oral Daily   Or  . thiamine  100 mg Intravenous Daily  . traMADol  50 mg Oral Q6H   Continuous Infusions: . lactated ringers 10 mL/hr at 06/23/18 1710  . methocarbamol (ROBAXIN) IV    . vancomycin 1,500 mg (06/25/18 1228)   PRN Meds:.acetaminophen **OR** acetaminophen, alum & mag hydroxide-simeth, HYDROcodone-acetaminophen, methocarbamol **OR** methocarbamol (ROBAXIN) IV, metoCLOPramide **OR** metoCLOPramide (REGLAN) injection,  morphine injection, ondansetron **OR** ondansetron (ZOFRAN) IV, polyethylene glycol   SUBJECTIVE: He is feeling better.  Less pain and swelling in his left knee.  The pain in his left elbow is almost completely gone and he has good range of motion.  He says he only has a little bit of stiffness remaining in his right shoulder.  Review of Systems: Review of Systems  Constitutional: Positive for malaise/fatigue. Negative for chills, diaphoresis, fever and weight loss.  Respiratory: Negative for cough, sputum production and shortness of breath.   Cardiovascular: Negative for chest pain.  Gastrointestinal: Positive for heartburn. Negative for abdominal pain, diarrhea, nausea and vomiting.  Musculoskeletal: Positive for joint pain. Negative for back pain.  Psychiatric/Behavioral: Positive for substance abuse.    No Known Allergies  OBJECTIVE: Vitals:   06/24/18 2356 06/25/18 0413 06/25/18 0804 06/25/18 1108  BP: 129/68 132/72 121/65 120/81  Pulse: (!) 111 99 (!) 107 (!) 107  Resp: 17 18 20 19   Temp: 98 F (36.7 C) 97.8 F (36.6 C) 98 F (36.7 C) 98.9 F (37.2 C)  TempSrc: Oral Oral Oral Oral  SpO2: 99% 100% 98% 100%  Weight:      Height:       Body mass index is 23.68 kg/m.  Physical Exam Constitutional:      Comments: It looks like he  is feeling better.  He is talkative and in a good mood.  Cardiovascular:     Rate and Rhythm: Normal rate and regular rhythm.     Heart sounds: No murmur.  Pulmonary:     Effort: Pulmonary effort is normal.     Breath sounds: Normal breath sounds.  Abdominal:     Palpations: Abdomen is soft.     Tenderness: There is no abdominal tenderness.  Musculoskeletal:     Comments: He has no swelling or warmth of his left knee.  His incisions look good.  He has full range of motion of his left elbow without pain.  He can slowly raise his right arm over his head without much discomfort.  Skin:    Comments: 2 scabbed abrasions on the dorsum of his  right hand and wrist.  1 scabbed abrasion on his left elbow.  A superficial necrotic area on his left index finger where the splinter was.  Psychiatric:        Mood and Affect: Mood normal.     Lab Results Lab Results  Component Value Date   WBC 34.5 (H) 06/25/2018   HGB 9.5 (L) 06/25/2018   HCT 28.8 (L) 06/25/2018   MCV 92.0 06/25/2018   PLT 383 06/25/2018    Lab Results  Component Value Date   CREATININE 1.00 06/25/2018   BUN 33 (H) 06/25/2018   NA 130 (L) 06/25/2018   K 4.4 06/25/2018   CL 101 06/25/2018   CO2 24 06/25/2018    Lab Results  Component Value Date   ALT 27 06/24/2018   AST 24 06/24/2018   ALKPHOS 94 06/24/2018   BILITOT 1.1 06/24/2018     Microbiology: Recent Results (from the past 240 hour(s))  SARS Coronavirus 2 [facility] order, Performed in [facility] hospital lab)     Status: None   Collection Time: 06/19/18  2:12 PM  Result Value Ref Range Status   SARS Coronavirus 2 NEGATIVE NEGATIVE Final    Comment: (NOTE) If result is NEGATIVE SARS-CoV-2 target nucleic acids are NOT DETECTED. The SARS-CoV-2 RNA is generally detectable in upper and lower  respiratory specimens during the acute phase of infection. The lowest  concentration of SARS-CoV-2 viral copies this assay can detect is 250  copies / mL. A negative result does not preclude SARS-CoV-2 infection  and should not be used as the sole basis for treatment or other  patient management decisions.  A negative result may occur with  improper specimen collection / handling, submission of specimen other  than nasopharyngeal swab, presence of viral mutation(s) within the  areas targeted by this assay, and inadequate number of viral copies  (<250 copies / mL). A negative result must be combined with clinical  observations, patient history, and epidemiological information. If result is POSITIVE SARS-CoV-2 target nucleic acids are DETECTED. The SARS-CoV-2 RNA is generally detectable in upper and lower   respiratory specimens dur ing the acute phase of infection.  Positive  results are indicative of active infection with SARS-CoV-2.  Clinical  correlation with patient history and other diagnostic information is  necessary to determine patient infection status.  Positive results do  not rule out bacterial infection or co-infection with other viruses. If result is PRESUMPTIVE POSTIVE SARS-CoV-2 nucleic acids MAY BE PRESENT.   A presumptive positive result was obtained on the submitted specimen  and confirmed on repeat testing.  While 2019 novel coronavirus  (SARS-CoV-2) nucleic acids may be present in the submitted sample  additional confirmatory testing may be necessary for epidemiological  and / or clinical management purposes  to differentiate between  SARS-CoV-2 and other Sarbecovirus currently known to infect humans.  If clinically indicated additional testing with an alternate test  methodology [phone]) is advised. The SARS-CoV-2 RNA is generally  detectable in upper and lower respiratory sp ecimens during the acute  phase of infection. The expected result is Negative. Fact Sheet for Patients:  BoilerBrush.com.cy Fact Sheet for Healthcare Providers: https://pope.com/ This test is not yet approved or cleared by the [place] FDA and has been authorized for detection and/or diagnosis of SARS-CoV-2 by FDA under an Emergency Use Authorization (EUA).  This EUA will remain in effect (meaning this test can be used) for the duration of the COVID-19 declaration under Section 564(b)(1) of the Act, 21 U.S.C. section 360bbb-3(b)(1), unless the authorization is terminated or revoked sooner. Performed at [facility], [address], [place], [place] [address]   Culture, blood (routine x 2)     Status: None (Preliminary result)   Collection Time: 06/20/18  9:00 AM  Result Value Ref Range Status   Specimen Description BLOOD RIGHT HAND  Final    Special Requests   Final    BOTTLES DRAWN AEROBIC ONLY Blood Culture results may not be optimal due to an inadequate volume of blood received in culture bottles   Culture   Final    NO GROWTH 4 DAYS Performed at [facility], [address], [place], [place] [address]    Report Status PENDING  Incomplete  Culture, blood (routine x 2)     Status: None (Preliminary result)   Collection Time: 06/20/18  9:10 AM  Result Value Ref Range Status   Specimen Description BLOOD LEFT HAND  Final   Special Requests   Final    BOTTLES DRAWN AEROBIC ONLY Blood Culture adequate volume   Culture   Final    NO GROWTH 4 DAYS Performed at [facility], [address], [place], [place] [address]    Report Status PENDING  Incomplete  Body fluid culture     Status: None (Preliminary result)   Collection Time: 06/22/18  1:05 PM  Result Value Ref Range Status   Specimen Description SYNOVIAL  Final   Special Requests KNEE  Final   Gram Stain   Final    MODERATE WBC PRESENT, PREDOMINANTLY PMN RARE GRAM POSITIVE COCCI IN PAIRS AND CHAINS    Culture   Final    CULTURE REINCUBATED FOR BETTER GROWTH Performed at [facility], [address] [place], [place] [address]    Report Status PENDING  Incomplete  Surgical pcr screen     Status: Abnormal   Collection Time: 06/23/18  4:40 PM  Result Value Ref Range Status   MRSA, PCR NEGATIVE NEGATIVE Final   Staphylococcus aureus POSITIVE (A) NEGATIVE Final    Comment: (NOTE) The Xpert SA Assay (FDA approved for NASAL specimens in patients 65 years of age and older), is one component of a comprehensive surveillance program. It is not intended to diagnose infection nor to guide or monitor treatment. Performed at [facility], [address], [place], [place] [address]   Aerobic/Anaerobic Culture (surgical/deep wound)     Status: None (Preliminary result)   Collection Time: 06/23/18  6:53 PM  Result Value Ref Range Status   Specimen  Description SYNOVIAL LEFT KNEE  Final   Special Requests SWABS  Final   Gram Stain  Final    ABUNDANT WBC PRESENT, PREDOMINANTLY PMN RARE GRAM POSITIVE COCCI    Culture   Final    NO GROWTH 2 DAYS NO ANAEROBES ISOLATED; CULTURE IN PROGRESS FOR 5 DAYS Performed at [facility], [address] [place], [place] [address]    Report Status PENDING  Incomplete    Cliffton Asters, MD [facility] [phone] pager   [phone] cell 06/25/2018, 12:50 PM

## 2018-06-25 NOTE — Progress Notes (Signed)
Patient ID: [name], male   DOB: October 06, 1965, 53 y.o.   MRN: [id number] Clinically the patient looks better overall.  I did remove the dressings on his left knee today and there is minimal swelling and no redness.  He can bend his knee better.  He now denies any left elbow pain and only minimal right shoulder pain.  Examination of his left elbow shows no redness or effusion or palpable area of fluctuance.  He is able to flex and extend his left elbow with no significant discomfort.  He is likewise able to lift his right shoulder and arm above his head.  There is no redness about the shoulder no fullness in his pain is minimal of the right shoulder.  They did attempt to try a MRI of his right shoulder but he could not tolerate being in the scanner in the cramped area.  He reports improvement overall in his pain.  Right now would hold off on any type of additional study of the right shoulder or the left elbow and less things worsen.  He can increase his activities from orthopedic standpoint as comfort allows.

## 2018-06-26 ENCOUNTER — Encounter (HOSPITAL_COMMUNITY): Payer: Self-pay | Admitting: Orthopedic Surgery

## 2018-06-26 DIAGNOSIS — S50312A Abrasion of left elbow, initial encounter: Secondary | ICD-10-CM

## 2018-06-26 DIAGNOSIS — Z95828 Presence of other vascular implants and grafts: Secondary | ICD-10-CM

## 2018-06-26 DIAGNOSIS — M7989 Other specified soft tissue disorders: Secondary | ICD-10-CM

## 2018-06-26 DIAGNOSIS — F1490 Cocaine use, unspecified, uncomplicated: Secondary | ICD-10-CM

## 2018-06-26 DIAGNOSIS — S60511A Abrasion of right hand, initial encounter: Secondary | ICD-10-CM

## 2018-06-26 DIAGNOSIS — X58XXXA Exposure to other specified factors, initial encounter: Secondary | ICD-10-CM

## 2018-06-26 LAB — BODY FLUID CULTURE: Culture: NO GROWTH

## 2018-06-26 LAB — BASIC METABOLIC PANEL
Anion gap: 3 — ABNORMAL LOW (ref 5–15)
BUN: 29 mg/dL — ABNORMAL HIGH (ref 6–20)
CO2: 22 mmol/L (ref 22–32)
Calcium: 7.9 mg/dL — ABNORMAL LOW (ref 8.9–10.3)
Chloride: 103 mmol/L (ref 98–111)
Creatinine, Ser: 0.91 mg/dL (ref 0.61–1.24)
GFR calc Af Amer: >60 mL/min (ref >60)
GFR calc non Af Amer: >60 mL/min (ref >60)
Glucose, Bld: 108 mg/dL — ABNORMAL HIGH (ref 70–99)
Potassium: 4.4 mmol/L (ref 3.5–5.1)
Sodium: 128 mmol/L — ABNORMAL LOW (ref 135–145)

## 2018-06-26 LAB — CBC
HCT: 27.8 % — ABNORMAL LOW (ref 39.0–52.0)
Hemoglobin: 9.3 g/dL — ABNORMAL LOW (ref 13.0–17.0)
MCH: 30.8 pg (ref 26.0–34.0)
MCHC: 33.5 g/dL (ref 30.0–36.0)
MCV: 92.1 fL (ref 80.0–100.0)
Platelets: 437 10*3/uL — ABNORMAL HIGH (ref 150–400)
RBC: 3.02 MIL/uL — ABNORMAL LOW (ref 4.22–5.81)
RDW: 15.7 % — ABNORMAL HIGH (ref 11.5–15.5)
WBC: 23.4 10*3/uL — ABNORMAL HIGH (ref 4.0–10.5)
nRBC: 0 % (ref 0.0–0.2)

## 2018-06-26 LAB — HEPATITIS B SURFACE ANTIGEN: Hepatitis B Surface Ag: NEGATIVE

## 2018-06-26 LAB — CYTOLOGY - NON PAP
Chlamydia: NEGATIVE
Neisseria Gonorrhea: NEGATIVE

## 2018-06-26 MED ORDER — SODIUM CHLORIDE 0.9% FLUSH
10.0000 mL | Freq: Two times a day (BID) | INTRAVENOUS | Status: DC
  Administered 2018-06-26 – 2018-07-12 (×21): 10 mL
  Administered 2018-07-13: 20 mL
  Administered 2018-07-13 – 2018-07-14 (×2): 10 mL
  Administered 2018-07-14 – 2018-07-15 (×2): 20 mL
  Administered 2018-07-15 – 2018-07-24 (×16): 10 mL
  Administered 2018-07-24 – 2018-07-25 (×2): 20 mL
  Administered 2018-07-27 – 2018-08-07 (×19): 10 mL
  Administered 2018-08-07: 20 mL
  Administered 2018-08-08 (×2): 10 mL
  Administered 2018-08-09: 22:00:00 20 mL

## 2018-06-26 MED ORDER — CHLORHEXIDINE GLUCONATE CLOTH 2 % EX PADS
6.0000 | MEDICATED_PAD | Freq: Every day | CUTANEOUS | Status: AC
  Administered 2018-06-26 – 2018-06-30 (×5): 6 via TOPICAL

## 2018-06-26 MED ORDER — TRAZODONE HCL 50 MG PO TABS
50.0000 mg | ORAL_TABLET | Freq: Once | ORAL | Status: AC
  Administered 2018-06-27: 50 mg via ORAL
  Filled 2018-06-26: qty 1

## 2018-06-26 MED ORDER — VANCOMYCIN IV (FOR PTA / DISCHARGE USE ONLY): 1000.0000 mg | Freq: Two times a day (BID) | INTRAVENOUS | 0 refills | Status: DC

## 2018-06-26 MED ORDER — SODIUM CHLORIDE 0.9% FLUSH
10.0000 mL | INTRAVENOUS | Status: DC | PRN
  Administered 2018-08-07: 10 mL
  Filled 2018-06-26: qty 40

## 2018-06-26 MED ORDER — VANCOMYCIN HCL 10 G IV SOLR
1250.0000 mg | Freq: Two times a day (BID) | INTRAVENOUS | Status: DC
  Filled 2018-06-26: qty 1250

## 2018-06-26 MED ORDER — VANCOMYCIN HCL IN DEXTROSE 1-5 GM/200ML-% IV SOLN
1000.0000 mg | Freq: Two times a day (BID) | INTRAVENOUS | Status: DC
  Administered 2018-06-26 – 2018-06-29 (×6): 1000 mg via INTRAVENOUS
  Filled 2018-06-26 (×7): qty 200

## 2018-06-26 MED ORDER — OXYCODONE-ACETAMINOPHEN 5-325 MG PO TABS: 1.0000 | ORAL_TABLET | ORAL | 0 refills | Status: AC | PRN

## 2018-06-26 MED ORDER — MUPIROCIN 2 % EX OINT
1.0000 "application " | TOPICAL_OINTMENT | Freq: Two times a day (BID) | CUTANEOUS | Status: AC
  Administered 2018-06-27 – 2018-07-01 (×9): 1 via NASAL
  Filled 2018-06-26 (×2): qty 22

## 2018-06-26 NOTE — Progress Notes (Signed)
Pharmacy Antibiotic Note  Drayvin Maggi is a 53 y.o. male admitted on 06/19/2018 with back pain and started on Rocephin for cellulitis.  Now concerned with septic joint and Pharmacy has been consulted for vancomycin dosing.  Patient is s/p aspiration.  Renal function continues to improve.  Afebrile, WBC down to 23.4.  Plan: Change vanc to 1250mg  IV Q12H for AUC 492 using SCr 0.91 Monitor renal fxn, clinical progress, check vanc AUC soon  Height: 5\' 10"  (177.8 cm) Weight: 165 lb (74.8 kg) IBW/kg (Calculated) : 73  Temp (24hrs), Avg:98.3 F (36.8 C), Min:98 F (36.7 C), Max:98.9 F (37.2 C)  Recent Labs  Lab 06/20/18 0912 06/21/18 0340 06/23/18 0336 06/24/18 0427 06/25/18 0400 06/26/18 0530  WBC  --  30.2* 46.6* 46.1* 34.5* 23.4*  CREATININE 2.55* 1.94* 1.27* 1.14 1.00 0.91  LATICACIDVEN 1.4  --   --   --   --   --     Estimated Creatinine Clearance: 96.9 mL/min (by C-G formula based on SCr of 0.91 mg/dL).    No Known Allergies   Vanc 5/15 >> CTX 5/12 >> 5/16  5/11 covid - negative 5/12 BCx - negative 5/14 synovial fluid L knee - negative 5/15 synovial fluid L knee - GPC on stain  Rayford Williamsen D. Laney Potash, PharmD, BCPS, BCCCP 06/26/2018, 10:57 AM

## 2018-06-26 NOTE — Progress Notes (Signed)
PROGRESS NOTE  [name] RKY:[id number] DOB: 1965/06/24 DOA: 06/19/2018 PCP: Patient, No Pcp Per   LOS: 6 days   Brief Narrative / Interim history: 53 year old male with history of hep C and substance abuse who was admitted to the hospital on 06/18/2017 with complaints of back pain as well as right upper extremity cellulitis.  Subjective: Continues to do well, minimal pain in his left knee, left elbow and right shoulder.  Anxious to work with physical therapy, denies any fever or chills  Assessment & Plan: Principal Problem:   Infection of left knee (HCC) Active Problems:   Hepatitis C antibody test positive   Cigarette smoker   Intractable back pain   AKI (acute kidney injury) (HCC)   Spinal stenosis of lumbosacral region   GERD (gastroesophageal reflux disease)   Mild protein-calorie malnutrition (HCC)   Acute medial meniscal tear, left, initial encounter   Polysubstance abuse (HCC)   Normocytic anemia   Effusion of left elbow   Recurrent boils   Principal Problem Left upper extremity cellulitis/sepsis complicated by superficial thrombosis and acute toxic metabolic encephalopathy -Blood cultures remain negative, he was initially on vancomycin and ceftriaxone, this has been narrowed to vancomycin alone for GPC's in the synovial fluid.  Cultures are still pending -Right upper extremity Doppler was negative for DVT but did show chronic superficial thrombosis, arterial duplex did not show any obstruction  Active Problems Septic arthritis in left knee  -Orthopedic surgery consulted, patient underwent knee aspiration which showed 19 K WBC with 81% neutrophils, but regardless Gram stain was positive for gram-positive -Orthopedic surgery took patient for washout on 5/15, cultures are pending, Gram stain did show GPC -ID consulted as well, appreciate input.  Given high suspicion for bacteremia he underwent a 2D echo which did not show clear-cut evidence of vegetations. -Currently  on vancomycin with plans for at least 2 weeks of IV antibiotics via PICC line -White count continues to improve  Left elbow pain and swelling, right shoulder pain -Concern for septic arthritis, patient could not tolerate MRI.  Given significant improvement in the range of motion as well as no swelling and no erythema, I have discussed with orthopedic surgery as well as infectious disease, will not pursue further imaging as this probably represented early infection that might have been cleared with antibiotics alone  Acute kidney injury -Patient's creatinine on admission was 3.5, likely in the setting of sepsis -Creatinine has normalized today.  He is eating and drinking well, will discontinue IV fluids  Alcohol/polysubstance abuse -He is not triggering significantly, patient tells me that he has not had anything to drink in the past 2 weeks.  He also denies recent drugs but per daughter this is not true -determined to quit smoking  Intractable back pain secondary to severe lumbosacral spinal stenosis -MRI completed on 06/13/2018, neuro-surgery also evaluated  Hyponatremia -Mild, overall stable  Protein calorie malnutrition/hypoalbuminemia underweight  Scheduled Meds: . bupivacaine  10 mL Infiltration Once  . docusate sodium  100 mg Oral BID  . enoxaparin (LOVENOX) injection  40 mg Subcutaneous Q24H  . esomeprazole  20 mg Oral QAC breakfast  . folic acid  1 mg Oral Daily  . methocarbamol  500 mg Oral TID  . multivitamin with minerals  1 tablet Oral Daily  . sodium chloride flush  10-40 mL Intracatheter Q12H  . sucralfate  1 g Oral TID WC & HS  . thiamine  100 mg Oral Daily   Or  . thiamine  100 mg  Intravenous Daily  . traMADol  50 mg Oral Q6H   Continuous Infusions: . lactated ringers 10 mL/hr at 06/23/18 1710  . methocarbamol (ROBAXIN) IV    . vancomycin     PRN Meds:.acetaminophen **OR** acetaminophen, alum & mag hydroxide-simeth, HYDROcodone-acetaminophen, methocarbamol  **OR** methocarbamol (ROBAXIN) IV, metoCLOPramide **OR** metoCLOPramide (REGLAN) injection, morphine injection, ondansetron **OR** ondansetron (ZOFRAN) IV, polyethylene glycol, sodium chloride flush  DVT prophylaxis: SCDs Code Status: Full code Family Communication: No family at bedside, discussed with daughter over the phone Disposition Plan: Likely home within 24 hours  Consultants:   Neurosurgery  Orthopedic surgery  Infectious disease  Procedures:   Left knee aspiration 5/15  Antimicrobials:  Ceftriaxone 5/12 >>5/16  Vancomycin 5/15 >>  Objective: Vitals:   06/25/18 2352 06/26/18 0431 06/26/18 0431 06/26/18 0731  BP: 126/76 119/74 119/74 113/71  Pulse: 100 (!) 108 (!) 105 (!) 105  Resp: Temp: 98.1 F (36.7 C) 98 F (36.7 C) 98 F (36.7 C) 98.5 F (36.9 C)  TempSrc: Oral Oral Oral Oral  SpO2: 99% 98% 98% 97%  Weight:      Height:        Intake/Output Summary (Last 24 hours) at 06/26/2018 1118 Last data filed at 06/26/2018 0434 Gross per 24 hour  Intake -  Output 2675 ml  Net -2675 ml   Filed Weights   06/19/18 1303 06/23/18 1708  Weight: 74.8 kg 74.8 kg    Examination:  Constitutional: No distress, appears comfortable Eyes: No scleral icterus seen ENMT: Moist mucous membranes Neck: normal, supple, no masses Respiratory: Lungs are clear to auscultation, no wheezing or crackles heard.  Normal respiratory effort Cardiovascular: Regular rate and rhythm, no murmurs.  No peripheral edema Abdomen: Soft, nontender, nondistended, positive bowel sounds Musculoskeletal: no clubbing / cyanosis.  Skin: No joint swelling, left knee without erythema or swelling, nontender to palpation Neurologic: Nonfocal, equal strength Psychiatric: Normal judgment and insight. Alert and oriented x 3. Normal mood.    Data Reviewed: I have independently reviewed following labs and imaging studies   CBC: Recent Labs  Lab 06/21/18 0340 06/23/18 0336 06/24/18  0427 06/25/18 0400 06/26/18 0530  WBC 30.2* 46.6* 46.1* 34.5* 23.4*  NEUTROABS 29.0*  --   --   --   --   HGB 9.8* 10.3* 9.8* 9.5* 9.3*  HCT 27.4* 30.4* 28.4* 28.8* 27.8*  MCV 88.1 90.7 90.4 92.0 92.1  PLT 307 323 320 383 437*   Basic Metabolic Panel: Recent Labs  Lab 06/19/18 1447  06/20/18 0912 06/21/18 0340 06/23/18 0336 06/24/18 0427 06/25/18 0400 06/26/18 0530  NA 122*   < > 127* 131* 131* 133* 130* 128*  K 3.3*   < > 3.9 4.0 4.5 4.8 4.4 4.4  CL 85*   < > 94* 101 101 101 101 103  CO2 22   < > 21* 21* 21* 21* 24 22  GLUCOSE 153*   < > 140* 138* 111* 106* 90 108*  BUN 120*   < > 109* 86* 46* 36* 33* 29*  CREATININE 3.56*   < > 2.55* 1.94* 1.27* 1.14 1.00 0.91  CALCIUM 7.4*   < > 7.7* 7.7* 7.3* 7.6* 7.7* 7.9*  MG 2.8*  --  2.6* 2.7*  --   --   --   --   PHOS  --   --  4.0  --   --   --   --   --    < > = values  in this interval not displayed.   GFR: Estimated Creatinine Clearance: 96.9 mL/min (by C-G formula based on SCr of 0.91 mg/dL). Liver Function Tests: Recent Labs  Lab 06/20/18 0912 06/21/18 0340 06/24/18 0427  AST 58* 43* 24  ALT 49* 37 27  ALKPHOS 82 80 94  BILITOT 1.1 0.9 1.1  PROT 6.0* 5.7* 5.8*  ALBUMIN 1.3* 1.1* 1.1*   No results for input(s): LIPASE, AMYLASE in the last 168 hours. No results for input(s): AMMONIA in the last 168 hours. Coagulation Profile: No results for input(s): INR, PROTIME in the last 168 hours. Cardiac Enzymes: Recent Labs  Lab 06/20/18 0912 06/21/18 0340  CKTOTAL 103 85   BNP (last 3 results) No results for input(s): PROBNP in the last 8760 hours. HbA1C: No results for input(s): HGBA1C in the last 72 hours. CBG: No results for input(s): GLUCAP in the last 168 hours. Lipid Profile: No results for input(s): CHOL, HDL, LDLCALC, TRIG, CHOLHDL, LDLDIRECT in the last 72 hours. Thyroid Function Tests: No results for input(s): TSH, T4TOTAL, FREET4, T3FREE, THYROIDAB in the last 72 hours. Anemia Panel: No results for  input(s): VITAMINB12, FOLATE, FERRITIN, TIBC, IRON, RETICCTPCT in the last 72 hours. Urine analysis:    Component Value Date/Time   COLORURINE YELLOW 06/19/2018 1944   APPEARANCEUR HAZY (A) 06/19/2018 1944   LABSPEC 1.014 06/19/2018 1944   PHURINE 5.0 06/19/2018 1944   GLUCOSEU 50 (A) 06/19/2018 1944   HGBUR MODERATE (A) 06/19/2018 1944   BILIRUBINUR NEGATIVE 06/19/2018 1944   KETONESUR NEGATIVE 06/19/2018 1944   PROTEINUR NEGATIVE 06/19/2018 1944   NITRITE NEGATIVE 06/19/2018 1944   LEUKOCYTESUR NEGATIVE 06/19/2018 1944   Sepsis Labs: Invalid input(s): PROCALCITONIN, LACTICIDVEN  Recent Results (from the past 240 hour(s))  SARS Coronavirus 2 [facility] order, Performed in [facility] hospital lab)     Status: None   Collection Time: 06/19/18  2:12 PM  Result Value Ref Range Status   SARS Coronavirus 2 NEGATIVE NEGATIVE Final    Comment: (NOTE) If result is NEGATIVE SARS-CoV-2 target nucleic acids are NOT DETECTED. The SARS-CoV-2 RNA is generally detectable in upper and lower  respiratory specimens during the acute phase of infection. The lowest  concentration of SARS-CoV-2 viral copies this assay can detect is 250  copies / mL. A negative result does not preclude SARS-CoV-2 infection  and should not be used as the sole basis for treatment or other  patient management decisions.  A negative result may occur with  improper specimen collection / handling, submission of specimen other  than nasopharyngeal swab, presence of viral mutation(s) within the  areas targeted by this assay, and inadequate number of viral copies  (<250 copies / mL). A negative result must be combined with clinical  observations, patient history, and epidemiological information. If result is POSITIVE SARS-CoV-2 target nucleic acids are DETECTED. The SARS-CoV-2 RNA is generally detectable in upper and lower  respiratory specimens dur ing the acute phase of infection.  Positive  results are indicative of  active infection with SARS-CoV-2.  Clinical  correlation with patient history and other diagnostic information is  necessary to determine patient infection status.  Positive results do  not rule out bacterial infection or co-infection with other viruses. If result is PRESUMPTIVE POSTIVE SARS-CoV-2 nucleic acids MAY BE PRESENT.   A presumptive positive result was obtained on the submitted specimen  and confirmed on repeat testing.  While 2019 novel coronavirus  (SARS-CoV-2) nucleic acids may be present in the submitted sample  additional confirmatory  testing may be necessary for epidemiological  and / or clinical management purposes  to differentiate between  SARS-CoV-2 and other Sarbecovirus currently known to infect humans.  If clinically indicated additional testing with an alternate test  methodology [phone]) is advised. The SARS-CoV-2 RNA is generally  detectable in upper and lower respiratory sp ecimens during the acute  phase of infection. The expected result is Negative. Fact Sheet for Patients:  BoilerBrush.com.cy Fact Sheet for Healthcare Providers: https://pope.com/ This test is not yet approved or cleared by the [place] FDA and has been authorized for detection and/or diagnosis of SARS-CoV-2 by FDA under an Emergency Use Authorization (EUA).  This EUA will remain in effect (meaning this test can be used) for the duration of the COVID-19 declaration under Section 564(b)(1) of the Act, 21 U.S.C. section 360bbb-3(b)(1), unless the authorization is terminated or revoked sooner. Performed at [facility], [address] [place], [place] [address]   Culture, blood (routine x 2)     Status: None   Collection Time: 06/20/18  9:00 AM  Result Value Ref Range Status   Specimen Description BLOOD RIGHT HAND  Final   Special Requests   Final    BOTTLES DRAWN AEROBIC ONLY Blood Culture results may not be optimal due to an inadequate  volume of blood received in culture bottles   Culture   Final    NO GROWTH 5 DAYS Performed at [facility], [address] [place], [place] [address]    Report Status 06/25/2018 FINAL  Final  Culture, blood (routine x 2)     Status: None   Collection Time: 06/20/18  9:10 AM  Result Value Ref Range Status   Specimen Description BLOOD LEFT HAND  Final   Special Requests   Final    BOTTLES DRAWN AEROBIC ONLY Blood Culture adequate volume   Culture   Final    NO GROWTH 5 DAYS Performed at [facility], [address] [place], [place] [address]    Report Status 06/25/2018 FINAL  Final  Body fluid culture     Status: None   Collection Time: 06/22/18  1:05 PM  Result Value Ref Range Status   Specimen Description SYNOVIAL  Final   Special Requests KNEE  Final   Gram Stain   Final    MODERATE WBC PRESENT, PREDOMINANTLY PMN RARE GRAM POSITIVE COCCI IN PAIRS AND CHAINS    Culture   Final    NO GROWTH 4 DAYS Performed at [facility], [address] [place], [place] [address]    Report Status 06/26/2018 FINAL  Final  Surgical pcr screen     Status: Abnormal   Collection Time: 06/23/18  4:40 PM  Result Value Ref Range Status   MRSA, PCR NEGATIVE NEGATIVE Final   Staphylococcus aureus POSITIVE (A) NEGATIVE Final    Comment: (NOTE) The Xpert SA Assay (FDA approved for NASAL specimens in patients 22 years of age and older), is one component of a comprehensive surveillance program. It is not intended to diagnose infection nor to guide or monitor treatment. Performed at [facility], [address] [name], [place] [address]   Aerobic/Anaerobic Culture (surgical/deep wound)     Status: None (Preliminary result)   Collection Time: 06/23/18  6:53 PM  Result Value Ref Range Status   Specimen Description SYNOVIAL LEFT KNEE  Final   Special Requests SWABS  Final   Gram Stain   Final    ABUNDANT WBC  PRESENT, PREDOMINANTLY PMN RARE GRAM POSITIVE COCCI     Culture   Final    NO GROWTH 2 DAYS NO ANAEROBES ISOLATED; CULTURE IN PROGRESS FOR 5 DAYS Performed at [facility], [address] [place], [place] [address]    Report Status PENDING  Incomplete      Radiology Studies: [place] Ekg Site Rite  Result Date: 06/25/2018 If Site Rite image not attached, placement could not be confirmed due to current cardiac rhythm.   Pamella Pertostin Gherghe, MD, PhD [facility]  Contact via  [email or web address]  [facility] Office Info P: [phone]  F: [phone]

## 2018-06-26 NOTE — Progress Notes (Addendum)
Bureau for Infectious Disease  Date of Admission:  06/19/2018           Day 7 antibiotics        Day 4 vancomycin ASSESSMENT: He continues to improve with treatment of his left knee septic arthritis of his left knee with gram-positive cocci seen on Gram stain. Cultures remain negative, which may be false given he was on antibiotics prior to surgery and knee aspiration.  I suspect that he had a early infection of his left elbow and right shoulder that is improving with empiric antibiotic therapy. His PICC line is in place and ready from ID standpoint for discharge home. He will be staying with his father with help from his oldest daughter to perform infusions/line care. We will make an appointment for him to see our team prior to his completion of IV therapy to consider need for further treatment with PO therapy.   Hepatitis C labs are still pending, he has a history of Genotype 3 in 2015. Will treat outpatient after his current bacterial infection is resolved. Likely will need Mavyret with Genotype 3 history for highest chance of cure.    PLAN: 1. Continue vancomycin per OPAT as detailed below.  2. Hepatitis C labs to follow up on in outpatient setting.   OPAT ORDERS:  Diagnosis: Septic arthritis L knee  Culture Result: negative (GPCs on stain from surgery and arthrocentesis)  No Known Allergies  Discharge antibiotics: Per pharmacy protocol Vancomycin trough 15-20 (unless otherwise indicated)  Duration: 2 weeks   End Date: May 29th   [place] and Maintenance Per Protocol __ Please pull PIC at completion of IV antibiotics _x_ Please leave PIC in place until doctor has seen patient or been notified  Labs weekly while on IV antibiotics: _x_ CBC with differential __ BMP _x_ BMP TWICE WEEKLY** __ CMP _x_ CRP _x_ ESR _x_ Vancomycin trough  Fax weekly labs to [phone]  Clinic Follow Up Appt: Clifford Madeira, NP May 28th @ 2:30 pm    Principal  Problem:   Infection of left knee ([place]) Active Problems:   Hepatitis C antibody test positive   Cigarette smoker   Intractable back pain   AKI (acute kidney injury) ([place])   Spinal stenosis of lumbosacral region   GERD (gastroesophageal reflux disease)   Mild protein-calorie malnutrition (HCC)   Acute medial meniscal tear, left, initial encounter   Polysubstance abuse (HCC)   Normocytic anemia   Effusion of left elbow   Recurrent boils   Scheduled Meds: . bupivacaine  10 mL Infiltration Once  . docusate sodium  100 mg Oral BID  . enoxaparin (LOVENOX) injection  40 mg Subcutaneous Q24H  . esomeprazole  20 mg Oral QAC breakfast  . folic acid  1 mg Oral Daily  . methocarbamol  500 mg Oral TID  . multivitamin with minerals  1 tablet Oral Daily  . sodium chloride flush  10-40 mL Intracatheter Q12H  . sucralfate  1 g Oral TID WC & HS  . thiamine  100 mg Oral Daily   Or  . thiamine  100 mg Intravenous Daily  . traMADol  50 mg Oral Q6H   Continuous Infusions: . lactated ringers 10 mL/hr at 06/23/18 1710  . methocarbamol (ROBAXIN) IV    . vancomycin     PRN Meds:.acetaminophen **OR** acetaminophen, alum & mag hydroxide-simeth, HYDROcodone-acetaminophen, methocarbamol **OR** methocarbamol (ROBAXIN) IV, metoCLOPramide **OR** metoCLOPramide (REGLAN) injection, morphine injection, ondansetron **OR**  ondansetron (ZOFRAN) IV, polyethylene glycol, sodium chloride flush   SUBJECTIVE: [name] continues to feel better today. He has attempted to do some walking in the room and feels like it went well. Left elbow pain has improved significantly as has the swelling. He continues to have swelling in the left forearm/elbow upper arm.   He is most concerned about his reflux. Reports significant burning in the chest with swallowing any foods, water or medications. It is interrupting his ability to take in food/fluids. Requesting twice a day Nexium.    Review of Systems: Review of Systems   Constitutional: Negative for chills, diaphoresis, fever, malaise/fatigue and weight loss.  Respiratory: Negative for cough, sputum production and shortness of breath.   Cardiovascular: Negative for chest pain.  Gastrointestinal: Positive for heartburn. Negative for abdominal pain, diarrhea, nausea and vomiting.  Musculoskeletal: Positive for joint pain. Negative for back pain.  Psychiatric/Behavioral: Positive for substance abuse (cocaine use).    No Known Allergies  OBJECTIVE: Vitals:   06/25/18 2352 06/26/18 0431 06/26/18 0431 06/26/18 0731  BP: 126/76 119/74 119/74 113/71  Pulse: 100 (!) 108 (!) 105 (!) 105  Resp: '18 18 18 16  ' Temp: 98.1 F (36.7 C) 98 F (36.7 C) 98 F (36.7 C) 98.5 F (36.9 C)  TempSrc: Oral Oral Oral Oral  SpO2: 99% 98% 98% 97%  Weight:      Height:       Body mass index is 23.68 kg/m.  Physical Exam Constitutional:      Comments: Resting in bed. Smiling and pleasant.   Cardiovascular:     Rate and Rhythm: Normal rate and regular rhythm.     Heart sounds: No murmur.  Pulmonary:     Effort: Pulmonary effort is normal.     Breath sounds: Normal breath sounds.  Abdominal:     Palpations: Abdomen is soft.     Tenderness: There is no abdominal tenderness.  Musculoskeletal:     Comments: No swelling, warmth or erythema to left knee. Slow but improving range of motion. Incisions appear to be healing nicely.  Resolved swelling of left elbow. Slow but full range of motion.  Right arm with ongoing pitting edema generalized around forearm and into upper arm. Cold to the touch.   Skin:    Comments: 2 scabbed abrasions on the dorsum of his right hand and wrist.  1 scabbed abrasion on his left elbow.  A superficial necrotic area on his left index finger where the splinter was.  Psychiatric:        Mood and Affect: Mood normal.     Lab Results Lab Results  Component Value Date   WBC 23.4 (H) 06/26/2018   HGB 9.3 (L) 06/26/2018   HCT 27.8 (L)  06/26/2018   MCV 92.1 06/26/2018   PLT 437 (H) 06/26/2018    Lab Results  Component Value Date   CREATININE 0.91 06/26/2018   BUN 29 (H) 06/26/2018   NA 128 (L) 06/26/2018   K 4.4 06/26/2018   CL 103 06/26/2018   CO2 22 06/26/2018    Lab Results  Component Value Date   ALT 27 06/24/2018   AST 24 06/24/2018   ALKPHOS 94 06/24/2018   BILITOT 1.1 06/24/2018     Microbiology: Recent Results (from the past 240 hour(s))  SARS Coronavirus 2 [name] order, Performed in [place] hospital lab)     Status: None   Collection Time: 06/19/18  2:12 PM  Result Value Ref Range Status  SARS Coronavirus 2 NEGATIVE NEGATIVE Final    Comment: (NOTE) If result is NEGATIVE SARS-CoV-2 target nucleic acids are NOT DETECTED. The SARS-CoV-2 RNA is generally detectable in upper and lower  respiratory specimens during the acute phase of infection. The lowest  concentration of SARS-CoV-2 viral copies this assay can detect is 250  copies / mL. A negative result does not preclude SARS-CoV-2 infection  and should not be used as the sole basis for treatment or other  patient management decisions.  A negative result may occur with  improper specimen collection / handling, submission of specimen other  than nasopharyngeal swab, presence of viral mutation(s) within the  areas targeted by this assay, and inadequate number of viral copies  (<250 copies / mL). A negative result must be combined with clinical  observations, patient history, and epidemiological information. If result is POSITIVE SARS-CoV-2 target nucleic acids are DETECTED. The SARS-CoV-2 RNA is generally detectable in upper and lower  respiratory specimens dur ing the acute phase of infection.  Positive  results are indicative of active infection with SARS-CoV-2.  Clinical  correlation with patient history and other diagnostic information is  necessary to determine patient infection status.  Positive results do  not rule out bacterial  infection or co-infection with other viruses. If result is PRESUMPTIVE POSTIVE SARS-CoV-2 nucleic acids MAY BE PRESENT.   A presumptive positive result was obtained on the submitted specimen  and confirmed on repeat testing.  While 2019 novel coronavirus  (SARS-CoV-2) nucleic acids may be present in the submitted sample  additional confirmatory testing may be necessary for epidemiological  and / or clinical management purposes  to differentiate between  SARS-CoV-2 and other Sarbecovirus currently known to infect humans.  If clinically indicated additional testing with an alternate test  methodology [id number]) is advised. The SARS-CoV-2 RNA is generally  detectable in upper and lower respiratory sp ecimens during the acute  phase of infection. The expected result is Negative. Fact Sheet for Patients:  StrictlyIdeas.no Fact Sheet for Healthcare Providers: BankingDealers.co.za This test is not yet approved or cleared by the [place] FDA and has been authorized for detection and/or diagnosis of SARS-CoV-2 by FDA under an Emergency Use Authorization (EUA).  This EUA will remain in effect (meaning this test can be used) for the duration of the COVID-19 declaration under Section 564(b)(1) of the Act, 21 U.S.C. section 360bbb-3(b)(1), unless the authorization is terminated or revoked sooner. Performed at [facility], [address] [place], [place] [address]   Culture, blood (routine x 2)     Status: None   Collection Time: 06/20/18  9:00 AM  Result Value Ref Range Status   Specimen Description BLOOD RIGHT HAND  Final   Special Requests   Final    BOTTLES DRAWN AEROBIC ONLY Blood Culture results may not be optimal due to an inadequate volume of blood received in culture bottles   Culture   Final    NO GROWTH 5 DAYS Performed at [facility], [place] [address] [place], [place] [address]    Report Status 06/25/2018 FINAL  Final   Culture, blood (routine x 2)     Status: None   Collection Time: 06/20/18  9:10 AM  Result Value Ref Range Status   Specimen Description BLOOD LEFT HAND  Final   Special Requests   Final    BOTTLES DRAWN AEROBIC ONLY Blood Culture adequate volume   Culture   Final    NO GROWTH 5 DAYS Performed at  [facility], [place] [address], [place], [place] [address]    Report Status 06/25/2018 FINAL  Final  Body fluid culture     Status: None   Collection Time: 06/22/18  1:05 PM  Result Value Ref Range Status   Specimen Description SYNOVIAL  Final   Special Requests KNEE  Final   Gram Stain   Final    MODERATE WBC PRESENT, PREDOMINANTLY PMN RARE GRAM POSITIVE COCCI IN PAIRS AND CHAINS    Culture   Final    NO GROWTH 4 DAYS Performed at [facility], [place] [address], [place], [place] [address]    Report Status 06/26/2018 FINAL  Final  Surgical pcr screen     Status: Abnormal   Collection Time: 06/23/18  4:40 PM  Result Value Ref Range Status   MRSA, PCR NEGATIVE NEGATIVE Final   Staphylococcus aureus POSITIVE (A) NEGATIVE Final    Comment: (NOTE) The Xpert SA Assay (FDA approved for NASAL specimens in patients 40 years of age and older), is one component of a comprehensive surveillance program. It is not intended to diagnose infection nor to guide or monitor treatment. Performed at [facility], [place] [address], [place], [place] [address]   Aerobic/Anaerobic Culture (surgical/deep wound)     Status: None (Preliminary result)   Collection Time: 06/23/18  6:53 PM  Result Value Ref Range Status   Specimen Description SYNOVIAL LEFT KNEE  Final   Special Requests SWABS  Final   Gram Stain   Final    ABUNDANT WBC PRESENT, PREDOMINANTLY PMN RARE GRAM POSITIVE COCCI    Culture   Final    NO GROWTH 2 DAYS NO ANAEROBES ISOLATED; CULTURE IN PROGRESS FOR 5 DAYS Performed at [facility], [place] [address] [place], [place] [address]    Report Status PENDING  Incomplete      Clifford Madeira, MSN, NP-C [facility] for Infectious Disease [email or web address] Pager: [phone] Office: [phone] [place]: [phone]

## 2018-06-26 NOTE — Progress Notes (Signed)
PHARMACY CONSULT NOTE FOR:  OUTPATIENT  PARENTERAL ANTIBIOTIC THERAPY (OPAT)  Indication: Septic knee infection   Regimen: Vancomycin 1000 mg Q 12 hours  End date: 07/07/2018  IV antibiotic discharge orders are pended. To discharging provider:  please sign these orders via discharge navigator,  Select New Orders & click on the button choice - Manage This Unsigned Work.     Thank you for allowing pharmacy to be a part of this patient's care.  Sharin Mons, PharmD, BCPS, BCIDP Infectious Diseases Clinical Pharmacist Phone: [phone] 06/26/2018, 11:37 AM

## 2018-06-26 NOTE — Progress Notes (Signed)
Peripherally Inserted Central Catheter/Midline Placement  The IV Nurse has discussed with the patient and/or persons authorized to consent for the patient, the purpose of this procedure and the potential benefits and risks involved with this procedure.  The benefits include less needle sticks, lab draws from the catheter, and the patient may be discharged home with the catheter. Risks include, but not limited to, infection, bleeding, blood clot (thrombus formation), and puncture of an artery; nerve damage and irregular heartbeat and possibility to perform a PICC exchange if needed/ordered by physician.  Alternatives to this procedure were also discussed.  Bard Power PICC patient education guide, fact sheet on infection prevention and patient information card has been provided to patient /or left at bedside.    PICC/Midline Placement Documentation  PICC Single Lumen 06/26/18 PICC Left Basilic 48 cm 0 cm (Active)  Indication for Insertion or Continuance of Line Home intravenous therapies (PICC only) 06/26/2018 10:00 AM  Exposed Catheter (cm) 0 cm 06/26/2018 10:00 AM  Site Assessment Clean;Dry;Intact 06/26/2018 10:00 AM  Line Status Flushed;Blood return noted 06/26/2018 10:00 AM  Dressing Type Transparent 06/26/2018 10:00 AM  Dressing Status Clean;Dry;Intact;Antimicrobial disc in place 06/26/2018 10:00 AM  Dressing Intervention New dressing 06/26/2018 10:00 AM  Dressing Change Due 07/03/18 06/26/2018 10:00 AM       Stacie Glaze Horton 06/26/2018, 10:23 AM

## 2018-06-26 NOTE — Progress Notes (Signed)
Pharmacy Antibiotic Note  [name] is a 53 y.o. male admitted on 06/19/2018 with back pain and started on Rocephin for cellulitis.  Now concerned with septic joint and Pharmacy has been consulted for vancomycin dosing.  Patient is s/p aspiration.  Renal function continues to improve.  Afebrile, WBC down to 23.4. Aim for AUC 400-500. Will aim for lower end of therapeutic range given recent AKI and likely discharge soon.   Plan: Change vanc to 1000mg  IV Q12H for AUC 437 using SCr 0.91 Monitor renal fxn, clinical progress, check vanc AUC soon  Height: 5\' 10"  (177.8 cm) Weight: 165 lb (74.8 kg) IBW/kg (Calculated) : 73  Temp (24hrs), Avg:98.2 F (36.8 C), Min:98 F (36.7 C), Max:98.5 F (36.9 C)  Recent Labs  Lab 06/20/18 0912 06/21/18 0340 06/23/18 0336 06/24/18 0427 06/25/18 0400 06/26/18 0530  WBC  --  30.2* 46.6* 46.1* 34.5* 23.4*  CREATININE 2.55* 1.94* 1.27* 1.14 1.00 0.91  LATICACIDVEN 1.4  --   --   --   --   --     Estimated Creatinine Clearance: 96.9 mL/min (by C-G formula based on SCr of 0.91 mg/dL).    No Known Allergies   Vanc 5/15 >> CTX 5/12 >> 5/16  5/11 covid - negative 5/12 BCx - negative 5/14 synovial fluid L knee - negative 5/15 synovial fluid L knee - GPC on stain  Sharin Mons, PharmD, BCPS, BCIDP Infectious Diseases Clinical Pharmacist Phone: [phone] 06/26/2018, 11:35 AM

## 2018-06-26 NOTE — Progress Notes (Signed)
PT Cancellation Note  Patient Details Name: [name] MRN: [id number] DOB: Aug 06, 1965   Cancelled Treatment:    Reason Eval/Treat Not Completed: Pain limiting ability to participate. PT checked back after lunch per pt request and continues to refuse due to reflux. Pt was educated on benefits of sitting up in chair from a mobility, pulmonary, and GI standpoint - pt not willing to participate at this time. Will continue to follow.    [name] 06/26/2018, 1:55 PM   [name], PT, DPT Acute Rehabilitation Services Pager: [phone] Office: [phone]

## 2018-06-26 NOTE — Progress Notes (Signed)
PT Cancellation Note  Patient Details Name: [name] MRN: [id number] DOB: [birth year]   Cancelled Treatment:    Reason Eval/Treat Not Completed: Pain limiting ability to participate. Pt reports he is anxious to get up and work with therapy, however with many reasons why he cannot participate at this time. States pain in his LUE from line placement this morning, as well as severe GERD are main limiting factors. PT offered transition to chair only, as well as position change in bed however pt declines all mobility at this time. Will check back as schedule allows to continue with PT POC.    [name] 06/26/2018, 11:48 AM   [name], PT, DPT Acute Rehabilitation Services Pager: [phone] Office: [phone]

## 2018-06-26 NOTE — TOC Transition Note (Addendum)
Transition of Care [facility]) - CM/SW Discharge Note   Patient Details  Name: [name] MRN: [id number] Date of Birth: 05-12-1965  [facility]) CM/SW Contact:  Bess Kinds, RN Phone Number: [phone] 06/26/2018, 1:59 PM   Clinical Narrative:    Received consult for home health needs. Patient needing IV infusion - referral made to [facility] for charity; [facility] - referral made to [facility] for charity RN and PT; and referral made to [facility] for charity walker. Patient will need HH orders for RN, PT with Face to Face and DME order for rolling walker (RW). Spoke with representative at [facility] Department to schedule f/u appointment - 07/10/2018 1pm. CM to continue to follow for transition of care needs.   Update: spoke with patient at bedside to discuss plans made for transition home. Advised of message left for his daughter, [name]. He stated that she is still at work. Patient states that he has transportation home. Patient is interested in using [facility] pharmacy and states he can afford copay. 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 CM to follow for transition of care needs.    Barriers to Discharge: Continued Medical Work up, Inadequate or no insurance   Patient Goals and CMS Choice        Discharge Placement                       Discharge Plan and Services                                     Social Determinants of Health (SDOH) Interventions     Readmission Risk Interventions No flowsheet data found.

## 2018-06-26 NOTE — Progress Notes (Signed)
wbat LLE abx per ID Mri scans on hold per primary team F/u with me next week

## 2018-06-27 ENCOUNTER — Inpatient Hospital Stay (HOSPITAL_COMMUNITY): Payer: Medicaid Other

## 2018-06-27 LAB — CBC
HCT: 26.6 % — ABNORMAL LOW (ref 39.0–52.0)
Hemoglobin: 8.8 g/dL — ABNORMAL LOW (ref 13.0–17.0)
MCH: 30.4 pg (ref 26.0–34.0)
MCHC: 33.1 g/dL (ref 30.0–36.0)
MCV: 92 fL (ref 80.0–100.0)
Platelets: 486 10*3/uL — ABNORMAL HIGH (ref 150–400)
RBC: 2.89 MIL/uL — ABNORMAL LOW (ref 4.22–5.81)
RDW: 15.5 % (ref 11.5–15.5)
WBC: 19.5 10*3/uL — ABNORMAL HIGH (ref 4.0–10.5)
nRBC: 0 % (ref 0.0–0.2)

## 2018-06-27 LAB — BASIC METABOLIC PANEL
Anion gap: 10 (ref 5–15)
BUN: 24 mg/dL — ABNORMAL HIGH (ref 6–20)
CO2: 24 mmol/L (ref 22–32)
Calcium: 8.3 mg/dL — ABNORMAL LOW (ref 8.9–10.3)
Chloride: 97 mmol/L — ABNORMAL LOW (ref 98–111)
Creatinine, Ser: 0.91 mg/dL (ref 0.61–1.24)
GFR calc Af Amer: >60 mL/min (ref >60)
GFR calc non Af Amer: >60 mL/min (ref >60)
Glucose, Bld: 104 mg/dL — ABNORMAL HIGH (ref 70–99)
Potassium: 4.3 mmol/L (ref 3.5–5.1)
Sodium: 131 mmol/L — ABNORMAL LOW (ref 135–145)

## 2018-06-27 LAB — RPR: RPR Ser Ql: UNDETERMINED

## 2018-06-27 LAB — HEPATITIS B CORE ANTIBODY, IGM: Hep B C IgM: UNDETERMINED

## 2018-06-27 MED ORDER — IOHEXOL 300 MG/ML  SOLN
100.0000 mL | Freq: Once | INTRAMUSCULAR | Status: AC | PRN
  Administered 2018-06-27: 11:00:00 100 mL via INTRAVENOUS

## 2018-06-27 MED ORDER — DIPHENHYDRAMINE HCL 25 MG PO CAPS
25.0000 mg | ORAL_CAPSULE | Freq: Once | ORAL | Status: AC
  Administered 2018-07-14: 22:00:00 25 mg via ORAL
  Filled 2018-06-27 (×2): qty 1

## 2018-06-27 MED ORDER — LORAZEPAM 0.5 MG PO TABS
0.5000 mg | ORAL_TABLET | Freq: Once | ORAL | Status: AC | PRN
  Administered 2018-06-27: 15:00:00 0.5 mg via ORAL
  Filled 2018-06-27: qty 1

## 2018-06-27 NOTE — TOC Progression Note (Signed)
Transition of Care [facility]) - Progression Note    Patient Details  Name: [name] MRN: [id number] Date of Birth: 08-May-1965  [facility]) CM/SW Contact  Bess Kinds, RN Phone Number: [phone] 06/27/2018, 11:02 AM  Clinical Narrative:    Received call from patient's daughter, Karar Wilker, in response to vm left for [facility] yesterday. Reviewed charity providers for patient and hospital f/u appointment scheduled at [facility]. 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 [name] stated that patient is not making this kind of money and is not sure where he is getting this information.   [name] stated that when patient is discharged that her grandfather, Knox Gantner, will pick patient up, but he needs to be notified by phone. She stated that he is not good with using the phone, so it may take more than one attempt to call in order to get through.   Information relayed to current CM.   Expected Discharge Plan: Home/Self Care Barriers to Discharge: Continued Medical Work up, Inadequate or no insurance  Expected Discharge Plan and Services Expected Discharge Plan: Home/Self Care       Living arrangements for the past 2 months: Single Family Home(one level)                                       Social Determinants of Health (SDOH) Interventions    Readmission Risk Interventions No flowsheet data found.

## 2018-06-27 NOTE — Progress Notes (Addendum)
PROGRESS NOTE  [name] ZOX:[id number] DOB: 04/13/1965 DOA: 06/19/2018 PCP: Patient, No Pcp Per   LOS: 7 days   Brief Narrative / Interim history: 53 year old male with history of hep C and substance abuse who was admitted to the hospital on 06/18/2017 with complaints of back pain as well as right upper extremity cellulitis.  He had an MRI of the L-spine which showed disc disease and foraminal stenosis at L5-S1, neurologic exam was intact, neurosurgery evaluated patient and recommended outpatient follow-up.  During hospital stay patient developed left knee swelling, pain and redness.  Arthrocentesis which showed gram-positive cocci.  ID was consulted.  He was taken to the operating room for a left knee washout.  He also developed left elbow and right shoulder swelling and pain, however could not tolerate MRI and these appear to have been improving on their own.  Patient had a PICC line with plans for home antibiotics for 2 weeks per ID, however on 5/19 developed new onset right leg weakness along with worsening right shoulder pain.  Subjective: Does not feel too good this morning, he states that since last night he is unable to raise his right leg more than 1 inch off of bed, because he has back pain when he tries to do so.  His back pain has gotten worse and shoots down to his right leg and into his right hip.  He also states that his right shoulder seems to be getting a little bit worse  Assessment & Plan: Principal Problem:   Infection of left knee (HCC) Active Problems:   Hepatitis C antibody test positive   Cigarette smoker   Intractable back pain   AKI (acute kidney injury) (HCC)   Spinal stenosis of lumbosacral region   GERD (gastroesophageal reflux disease)   Mild protein-calorie malnutrition (HCC)   Acute medial meniscal tear, left, initial encounter   Polysubstance abuse (HCC)   Normocytic anemia   Effusion of left elbow   Recurrent boils   Addendum 8 pm Reviewed MRI L  spine and Shoulder CT. Imaging reveals multiple areas of infections / abscess / septic arthritis / osteomyelitis / discitis. Discussed with Neurosurgery Dr. Dutch QuintPoole, no acute interventions needed and recommended prolonged course of antibiotics. D/w Dr. Ophelia CharterYates from orthopedic surgery (on call for Dr. August Saucerean), who will review images and will discuss case with Dr. August Saucerean. I also updated ID service that the patient was not discharged today and they will re-consult again tomorrow.   Principal Problem Left upper extremity cellulitis/sepsis complicated by superficial thrombosis and acute toxic metabolic encephalopathy -Blood cultures remain negative, he was initially on vancomycin and ceftriaxone, this has been narrowed to vancomycin alone for GPC's in the synovial fluid.  Cultures are still pending but likely to be without growth given obtained while on antibiotics -Right upper extremity Doppler was negative for DVT but did show chronic superficial thrombosis, arterial duplex did not show any obstruction  Active Problems Septic arthritis in left knee  -Orthopedic surgery consulted, patient underwent knee aspiration which showed 19 K WBC with 81% neutrophils, but regardless Gram stain was positive for GPC -Orthopedic surgery took patient for washout on 5/15, cultures are pending -ID consulted as well, appreciate input.  Given high suspicion for bacteremia he underwent a 2D echo which did not show clear-cut evidence of vegetations. -Currently on vancomycin with plans for at least 2 weeks of IV antibiotics -PICC line placed on 5/18  Left elbow pain and swelling, right shoulder pain -Concern for septic arthritis, patient  could not tolerate MRI.  Given significant improvement in the range of motion as well as no swelling and no erythema, I have discussed with orthopedic surgery as well as infectious disease, will not pursue further imaging as this probably represented early infection that might have been cleared with  antibiotics alone -On 5/19 patient complains of worsening right shoulder pain, given that he does not tolerate a shoulder MRI will pursue a CT scan of the shoulder  Acute kidney injury -Patient's creatinine on admission was 3.5, likely in the setting of sepsis -Creatinine has normalized with fluids.  He is off IV fluids. Continue to monitor  Alcohol/polysubstance abuse -He is not triggering significantly, patient tells me that he has not had anything to drink in the past 2 weeks.  He also denies intravenous drugs  Intractable back pain secondary to severe lumbosacral spinal stenosis -MRI completed on 06/13/2018, neuro-surgery also evaluated -Given worsening pain on 5/19 along with right leg weakness, will repeat MRI  Hyponatremia -Mild, overall stable  Protein calorie malnutrition/hypoalbuminemia underweight  Scheduled Meds: . bupivacaine  10 mL Infiltration Once  . Chlorhexidine Gluconate Cloth  6 each Topical Daily  . diphenhydrAMINE  25 mg Oral Once  . docusate sodium  100 mg Oral BID  . enoxaparin (LOVENOX) injection  40 mg Subcutaneous Q24H  . esomeprazole  20 mg Oral QAC breakfast  . folic acid  1 mg Oral Daily  . methocarbamol  500 mg Oral TID  . multivitamin with minerals  1 tablet Oral Daily  . mupirocin ointment  1 application Nasal BID  . sodium chloride flush  10-40 mL Intracatheter Q12H  . sucralfate  1 g Oral TID WC & HS  . thiamine  100 mg Oral Daily   Or  . thiamine  100 mg Intravenous Daily  . traMADol  50 mg Oral Q6H   Continuous Infusions: . lactated ringers 10 mL/hr at 06/23/18 1710  . methocarbamol (ROBAXIN) IV    . vancomycin 1,000 mg (06/27/18 0031)   PRN Meds:.acetaminophen **OR** acetaminophen, alum & mag hydroxide-simeth, HYDROcodone-acetaminophen, LORazepam, methocarbamol **OR** methocarbamol (ROBAXIN) IV, metoCLOPramide **OR** metoCLOPramide (REGLAN) injection, morphine injection, ondansetron **OR** ondansetron (ZOFRAN) IV, polyethylene glycol,  sodium chloride flush  DVT prophylaxis: SCDs Code Status: Full code Family Communication: No family at bedside, discussed with daughter over the phone Disposition Plan: Likely home within 24 hours  Consultants:   Neurosurgery  Orthopedic surgery  Infectious disease  Procedures:   Left knee aspiration 5/15  Antimicrobials:  Ceftriaxone 5/12 >>5/16  Vancomycin 5/15 >>  Objective: Vitals:   06/26/18 1921 06/26/18 2344 06/27/18 0343 06/27/18 0830  BP: 136/81 120/78 134/77 109/83  Pulse: (!) 103 (!) 118 (!) 111 (!) 105  Resp: Temp: 98 F (36.7 C) 98.3 F (36.8 C) 98.2 F (36.8 C) 97.7 F (36.5 C)  TempSrc: Oral Oral Oral Oral  SpO2: 100% 99% 100% 98%  Weight:      Height:        Intake/Output Summary (Last 24 hours) at 06/27/2018 1310 Last data filed at 06/27/2018 1610 Gross per 24 hour  Intake 560 ml  Output 2200 ml  Net -1640 ml   Filed Weights   06/19/18 1303 06/23/18 1708  Weight: 74.8 kg 74.8 kg    Examination:  Constitutional: appears upset, crying Eyes: No icterus ENMT: Moist mucous membranes Neck: normal, supple, no masses Respiratory: Clear to auscultation bilaterally, no wheezing or crackles, normal respiratory effort Cardiovascular: Regular rate and rhythm, no  murmurs.  No edema Abdomen: Soft, NT, ND, positive bowel sounds Musculoskeletal: no clubbing / cyanosis.  Skin: Left knee without erythema or swelling, nontender to palpation.  Unable to raise right arm above shoulder level due to pain Neurologic: 5 out of 5 in bilateral upper extremities and left lower extremity, right lower extremity appears weaker than the left for the proximal muscles Psychiatric: Alert and oriented x4   Data Reviewed: I have independently reviewed following labs and imaging studies   CBC: Recent Labs  Lab 06/21/18 0340 06/23/18 0336 06/24/18 0427 06/25/18 0400 06/26/18 0530 06/27/18 0742  WBC 30.2* 46.6* 46.1* 34.5* 23.4* 19.5*  NEUTROABS  29.0*  --   --   --   --   --   HGB 9.8* 10.3* 9.8* 9.5* 9.3* 8.8*  HCT 27.4* 30.4* 28.4* 28.8* 27.8* 26.6*  MCV 88.1 90.7 90.4 92.0 92.1 92.0  PLT 307 323 320 383 437* 486*   Basic Metabolic Panel: Recent Labs  Lab 06/21/18 0340 06/23/18 0336 06/24/18 0427 06/25/18 0400 06/26/18 0530 06/27/18 0742  NA 131* 131* 133* 130* 128* 131*  K 4.0 4.5 4.8 4.4 4.4 4.3  CL 101 101 101 101 103 97*  CO2 21* 21* 21* 24 22 24   GLUCOSE 138* 111* 106* 90 108* 104*  BUN 86* 46* 36* 33* 29* 24*  CREATININE 1.94* 1.27* 1.14 1.00 0.91 0.91  CALCIUM 7.7* 7.3* 7.6* 7.7* 7.9* 8.3*  MG 2.7*  --   --   --   --   --    GFR: Estimated Creatinine Clearance: 96.9 mL/min (by C-G formula based on SCr of 0.91 mg/dL). Liver Function Tests: Recent Labs  Lab 06/21/18 0340 06/24/18 0427  AST 43* 24  ALT 37 27  ALKPHOS 80 94  BILITOT 0.9 1.1  PROT 5.7* 5.8*  ALBUMIN 1.1* 1.1*   No results for input(s): LIPASE, AMYLASE in the last 168 hours. No results for input(s): AMMONIA in the last 168 hours. Coagulation Profile: No results for input(s): INR, PROTIME in the last 168 hours. Cardiac Enzymes: Recent Labs  Lab 06/21/18 0340  CKTOTAL 85   BNP (last 3 results) No results for input(s): PROBNP in the last 8760 hours. HbA1C: No results for input(s): HGBA1C in the last 72 hours. CBG: No results for input(s): GLUCAP in the last 168 hours. Lipid Profile: No results for input(s): CHOL, HDL, LDLCALC, TRIG, CHOLHDL, LDLDIRECT in the last 72 hours. Thyroid Function Tests: No results for input(s): TSH, T4TOTAL, FREET4, T3FREE, THYROIDAB in the last 72 hours. Anemia Panel: No results for input(s): VITAMINB12, FOLATE, FERRITIN, TIBC, IRON, RETICCTPCT in the last 72 hours. Urine analysis:    Component Value Date/Time   COLORURINE YELLOW 06/19/2018 1944   APPEARANCEUR HAZY (A) 06/19/2018 1944   LABSPEC 1.014 06/19/2018 1944   PHURINE 5.0 06/19/2018 1944   GLUCOSEU 50 (A) 06/19/2018 1944   HGBUR MODERATE  (A) 06/19/2018 1944   BILIRUBINUR NEGATIVE 06/19/2018 1944   KETONESUR NEGATIVE 06/19/2018 1944   PROTEINUR NEGATIVE 06/19/2018 1944   NITRITE NEGATIVE 06/19/2018 1944   LEUKOCYTESUR NEGATIVE 06/19/2018 1944   Sepsis Labs: Invalid input(s): PROCALCITONIN, LACTICIDVEN  Recent Results (from the past 240 hour(s))  SARS Coronavirus 2 [facility] order, Performed in [facility] hospital lab)     Status: None   Collection Time: 06/19/18  2:12 PM  Result Value Ref Range Status   SARS Coronavirus 2 NEGATIVE NEGATIVE Final    Comment: (NOTE) If result is NEGATIVE SARS-CoV-2 target nucleic acids are NOT  DETECTED. The SARS-CoV-2 RNA is generally detectable in upper and lower  respiratory specimens during the acute phase of infection. The lowest  concentration of SARS-CoV-2 viral copies this assay can detect is 250  copies / mL. A negative result does not preclude SARS-CoV-2 infection  and should not be used as the sole basis for treatment or other  patient management decisions.  A negative result may occur with  improper specimen collection / handling, submission of specimen other  than nasopharyngeal swab, presence of viral mutation(s) within the  areas targeted by this assay, and inadequate number of viral copies  (<250 copies / mL). A negative result must be combined with clinical  observations, patient history, and epidemiological information. If result is POSITIVE SARS-CoV-2 target nucleic acids are DETECTED. The SARS-CoV-2 RNA is generally detectable in upper and lower  respiratory specimens dur ing the acute phase of infection.  Positive  results are indicative of active infection with SARS-CoV-2.  Clinical  correlation with patient history and other diagnostic information is  necessary to determine patient infection status.  Positive results do  not rule out bacterial infection or co-infection with other viruses. If result is PRESUMPTIVE POSTIVE SARS-CoV-2 nucleic acids MAY BE  PRESENT.   A presumptive positive result was obtained on the submitted specimen  and confirmed on repeat testing.  While 2019 novel coronavirus  (SARS-CoV-2) nucleic acids may be present in the submitted sample  additional confirmatory testing may be necessary for epidemiological  and / or clinical management purposes  to differentiate between  SARS-CoV-2 and other Sarbecovirus currently known to infect humans.  If clinically indicated additional testing with an alternate test  methodology [phone]) is advised. The SARS-CoV-2 RNA is generally  detectable in upper and lower respiratory sp ecimens during the acute  phase of infection. The expected result is Negative. Fact Sheet for Patients:  BoilerBrush.com.cy Fact Sheet for Healthcare Providers: https://pope.com/ This test is not yet approved or cleared by the [place] FDA and has been authorized for detection and/or diagnosis of SARS-CoV-2 by FDA under an Emergency Use Authorization (EUA).  This EUA will remain in effect (meaning this test can be used) for the duration of the COVID-19 declaration under Section 564(b)(1) of the Act, 21 U.S.C. section 360bbb-3(b)(1), unless the authorization is terminated or revoked sooner. Performed at [facility], [address] [place], [place] [address]   Culture, blood (routine x 2)     Status: None   Collection Time: 06/20/18  9:00 AM  Result Value Ref Range Status   Specimen Description BLOOD RIGHT HAND  Final   Special Requests   Final    BOTTLES DRAWN AEROBIC ONLY Blood Culture results may not be optimal due to an inadequate volume of blood received in culture bottles   Culture   Final    NO GROWTH 5 DAYS Performed at [facility], [address], [place], [place] [address]    Report Status 06/25/2018 FINAL  Final  Culture, blood (routine x 2)     Status: None   Collection Time: 06/20/18  9:10 AM  Result Value Ref Range Status    Specimen Description BLOOD LEFT HAND  Final   Special Requests   Final    BOTTLES DRAWN AEROBIC ONLY Blood Culture adequate volume   Culture   Final    NO GROWTH 5 DAYS Performed at [facility], [address], [place], [place] [address]    Report Status 06/25/2018 FINAL  Final  Body fluid culture     Status: None   Collection Time: 06/22/18  1:05 PM  Result Value Ref Range Status   Specimen Description SYNOVIAL  Final   Special Requests KNEE  Final   Gram Stain   Final    MODERATE WBC PRESENT, PREDOMINANTLY PMN RARE GRAM POSITIVE COCCI IN PAIRS AND CHAINS    Culture   Final    NO GROWTH 4 DAYS Performed at [facility], [address] [place], [place] [address]    Report Status 06/26/2018 FINAL  Final  Surgical pcr screen     Status: Abnormal   Collection Time: 06/23/18  4:40 PM  Result Value Ref Range Status   MRSA, PCR NEGATIVE NEGATIVE Final   Staphylococcus aureus POSITIVE (A) NEGATIVE Final    Comment: (NOTE) The Xpert SA Assay (FDA approved for NASAL specimens in patients 47 years of age and older), is one component of a comprehensive surveillance program. It is not intended to diagnose infection nor to guide or monitor treatment. Performed at [facility], [address] [place], [place] [address]   Aerobic/Anaerobic Culture (surgical/deep wound)     Status: None (Preliminary result)   Collection Time: 06/23/18  6:53 PM  Result Value Ref Range Status   Specimen Description SYNOVIAL LEFT KNEE  Final   Special Requests SWABS  Final   Gram Stain   Final    ABUNDANT WBC PRESENT, PREDOMINANTLY PMN RARE GRAM POSITIVE COCCI    Culture   Final    NO GROWTH 4 DAYS NO ANAEROBES ISOLATED; CULTURE IN PROGRESS FOR 5 DAYS Performed at [facility], [address] [place], [place] [address]    Report Status PENDING  Incomplete      Radiology Studies: No results found.  Pamella Pert, MD, PhD [facility]  Contact via   [email or web address]  [facility] Office Info P: [phone]  F: [phone]

## 2018-06-27 NOTE — Progress Notes (Signed)
OT Cancellation Note  Patient Details Name: [name] MRN: [id number] DOB: [birth year]   Cancelled Treatment:    Reason Eval/Treat Not Completed: Other (comment)(MD requesting to hold therapy today)--reports plan for further workup on LE pain.  Will follow.   Chancy Milroy, OT Acute Rehabilitation Services Pager [phone] Office [phone]    Chancy Milroy 06/27/2018, 9:58 AM

## 2018-06-27 NOTE — Progress Notes (Signed)
PT Cancellation Note  Patient Details Name: [name] MRN: [id number] DOB: 1965/07/20   Cancelled Treatment:    Reason Eval/Treat Not Completed: Spoke with MD who asks PT to hold at this time 2 increased pain and weakness in RLE. Will continue to follow and resume PT POC as appropriate.    [name] 06/27/2018, 9:52 AM   [name], PT, DPT Acute Rehabilitation Services Pager: [phone] Office: [phone]

## 2018-06-28 ENCOUNTER — Encounter (HOSPITAL_COMMUNITY)
Admission: EM | Disposition: A | Discharge status: Home Health | Payer: Self-pay | Source: Home / Self Care | Attending: Internal Medicine

## 2018-06-28 ENCOUNTER — Inpatient Hospital Stay (HOSPITAL_COMMUNITY): Payer: Medicaid Other | Admitting: Anesthesiology

## 2018-06-28 ENCOUNTER — Encounter (HOSPITAL_COMMUNITY): Payer: Self-pay | Admitting: Orthopedic Surgery

## 2018-06-28 DIAGNOSIS — G061 Intraspinal abscess and granuloma: Secondary | ICD-10-CM

## 2018-06-28 DIAGNOSIS — L02214 Cutaneous abscess of groin: Secondary | ICD-10-CM

## 2018-06-28 DIAGNOSIS — L02413 Cutaneous abscess of right upper limb: Secondary | ICD-10-CM

## 2018-06-28 DIAGNOSIS — K6819 Other retroperitoneal abscess: Secondary | ICD-10-CM

## 2018-06-28 DIAGNOSIS — M009 Pyogenic arthritis, unspecified: Secondary | ICD-10-CM | POA: Diagnosis present

## 2018-06-28 DIAGNOSIS — M4647 Discitis, unspecified, lumbosacral region: Secondary | ICD-10-CM

## 2018-06-28 DIAGNOSIS — A498 Other bacterial infections of unspecified site: Secondary | ICD-10-CM

## 2018-06-28 DIAGNOSIS — L0231 Cutaneous abscess of buttock: Secondary | ICD-10-CM

## 2018-06-28 DIAGNOSIS — K6812 Psoas muscle abscess: Secondary | ICD-10-CM

## 2018-06-28 DIAGNOSIS — M01X9 Direct infection of multiple joints in infectious and parasitic diseases classified elsewhere: Secondary | ICD-10-CM

## 2018-06-28 HISTORY — PX: ACROMIO-CLAVICULAR JOINT REPAIR: SHX5183

## 2018-06-28 HISTORY — DX: Psoas muscle abscess: K68.12

## 2018-06-28 HISTORY — PX: IRRIGATION AND DEBRIDEMENT SHOULDER: SHX5880

## 2018-06-28 HISTORY — DX: Discitis, unspecified, lumbosacral region: M46.47

## 2018-06-28 LAB — VANCOMYCIN, TROUGH: Vancomycin Tr: 23 ug/mL (ref 15–20)

## 2018-06-28 LAB — BASIC METABOLIC PANEL
Anion gap: 6 (ref 5–15)
BUN: 23 mg/dL — ABNORMAL HIGH (ref 6–20)
CO2: 26 mmol/L (ref 22–32)
Calcium: 8.1 mg/dL — ABNORMAL LOW (ref 8.9–10.3)
Chloride: 97 mmol/L — ABNORMAL LOW (ref 98–111)
Creatinine, Ser: 0.88 mg/dL (ref 0.61–1.24)
GFR calc Af Amer: >60 mL/min (ref >60)
GFR calc non Af Amer: >60 mL/min (ref >60)
Glucose, Bld: 101 mg/dL — ABNORMAL HIGH (ref 70–99)
Potassium: 4.3 mmol/L (ref 3.5–5.1)
Sodium: 129 mmol/L — ABNORMAL LOW (ref 135–145)

## 2018-06-28 LAB — CBC
HCT: 25.2 % — ABNORMAL LOW (ref 39.0–52.0)
Hemoglobin: 8.4 g/dL — ABNORMAL LOW (ref 13.0–17.0)
MCH: 30.4 pg (ref 26.0–34.0)
MCHC: 33.3 g/dL (ref 30.0–36.0)
MCV: 91.3 fL (ref 80.0–100.0)
Platelets: 499 10*3/uL — ABNORMAL HIGH (ref 150–400)
RBC: 2.76 MIL/uL — ABNORMAL LOW (ref 4.22–5.81)
RDW: 15.4 % (ref 11.5–15.5)
WBC: 17.6 10*3/uL — ABNORMAL HIGH (ref 4.0–10.5)
nRBC: 0 % (ref 0.0–0.2)

## 2018-06-28 LAB — AEROBIC/ANAEROBIC CULTURE W GRAM STAIN (SURGICAL/DEEP WOUND): Culture: NO GROWTH

## 2018-06-28 SURGERY — IRRIGATION AND DEBRIDEMENT SHOULDER
Anesthesia: General | Site: Shoulder | Laterality: Right

## 2018-06-28 MED ORDER — LIDOCAINE 2% (20 MG/ML) 5 ML SYRINGE
INTRAMUSCULAR | Status: DC | PRN
  Administered 2018-06-28: 60 mg via INTRAVENOUS

## 2018-06-28 MED ORDER — MENTHOL 3 MG MT LOZG: 1.0000 | LOZENGE | OROMUCOSAL | Status: DC | PRN

## 2018-06-28 MED ORDER — METOCLOPRAMIDE HCL 5 MG/ML IJ SOLN: 5.0000 mg | Freq: Three times a day (TID) | INTRAMUSCULAR | Status: DC | PRN

## 2018-06-28 MED ORDER — PHENYLEPHRINE HCL (PRESSORS) 10 MG/ML IV SOLN
INTRAVENOUS | Status: AC
  Filled 2018-06-28: qty 1

## 2018-06-28 MED ORDER — FENTANYL CITRATE (PF) 250 MCG/5ML IJ SOLN
INTRAMUSCULAR | Status: DC | PRN
  Administered 2018-06-28: 50 ug via INTRAVENOUS
  Administered 2018-06-28 (×2): 100 ug via INTRAVENOUS
  Administered 2018-06-28 (×3): 50 ug via INTRAVENOUS

## 2018-06-28 MED ORDER — PROMETHAZINE HCL 25 MG/ML IJ SOLN: 6.2500 mg | INTRAMUSCULAR | Status: DC | PRN

## 2018-06-28 MED ORDER — ONDANSETRON HCL 4 MG PO TABS
4.0000 mg | ORAL_TABLET | Freq: Four times a day (QID) | ORAL | Status: DC | PRN
  Administered 2018-07-12: 14:00:00 4 mg via ORAL
  Filled 2018-06-28: qty 1

## 2018-06-28 MED ORDER — HYDROMORPHONE HCL 1 MG/ML IJ SOLN
INTRAMUSCULAR | Status: AC
  Filled 2018-06-28: qty 2

## 2018-06-28 MED ORDER — CLONIDINE HCL (ANALGESIA) 100 MCG/ML EP SOLN
EPIDURAL | Status: AC
  Filled 2018-06-28: qty 10

## 2018-06-28 MED ORDER — MIDAZOLAM HCL 2 MG/2ML IJ SOLN
INTRAMUSCULAR | Status: AC
  Filled 2018-06-28: qty 2

## 2018-06-28 MED ORDER — MORPHINE SULFATE (PF) 2 MG/ML IV SOLN
0.5000 mg | INTRAVENOUS | Status: DC | PRN
  Administered 2018-06-28 – 2018-07-02 (×23): 1 mg via INTRAVENOUS
  Filled 2018-06-28 (×23): qty 1

## 2018-06-28 MED ORDER — LACTATED RINGERS IV SOLN
INTRAVENOUS | Status: DC | PRN
  Administered 2018-06-28 (×2): via INTRAVENOUS

## 2018-06-28 MED ORDER — BUPIVACAINE HCL (PF) 0.25 % IJ SOLN
INTRAMUSCULAR | Status: DC | PRN
  Administered 2018-06-28: 30 mL

## 2018-06-28 MED ORDER — SODIUM CHLORIDE 0.9 % IV SOLN
INTRAVENOUS | Status: DC | PRN
  Administered 2018-06-28: 80 ug/min via INTRAVENOUS

## 2018-06-28 MED ORDER — DOCUSATE SODIUM 100 MG PO CAPS
100.0000 mg | ORAL_CAPSULE | Freq: Two times a day (BID) | ORAL | Status: DC
  Administered 2018-06-29 – 2018-07-02 (×7): 100 mg via ORAL
  Filled 2018-06-28 (×8): qty 1

## 2018-06-28 MED ORDER — HYDROCODONE-ACETAMINOPHEN 5-325 MG PO TABS: 1.0000 | ORAL_TABLET | ORAL | Status: DC | PRN

## 2018-06-28 MED ORDER — MORPHINE SULFATE (PF) 4 MG/ML IV SOLN
INTRAVENOUS | Status: AC
  Filled 2018-06-28: qty 1

## 2018-06-28 MED ORDER — PHENYLEPHRINE HCL (PRESSORS) 10 MG/ML IV SOLN
INTRAVENOUS | Status: DC | PRN
  Administered 2018-06-28 (×2): 80 ug via INTRAVENOUS

## 2018-06-28 MED ORDER — GENTAMICIN SULFATE 40 MG/ML IJ SOLN
INTRAMUSCULAR | Status: AC
  Filled 2018-06-28: qty 6

## 2018-06-28 MED ORDER — SUCCINYLCHOLINE CHLORIDE 200 MG/10ML IV SOSY
PREFILLED_SYRINGE | INTRAVENOUS | Status: DC | PRN
  Administered 2018-06-28: 90 mg via INTRAVENOUS

## 2018-06-28 MED ORDER — PROPOFOL 10 MG/ML IV BOLUS
INTRAVENOUS | Status: DC | PRN
  Administered 2018-06-28: 150 mg via INTRAVENOUS
  Administered 2018-06-28: 50 mg via INTRAVENOUS

## 2018-06-28 MED ORDER — PHENOL 1.4 % MT LIQD: 1.0000 | OROMUCOSAL | Status: DC | PRN

## 2018-06-28 MED ORDER — SODIUM CHLORIDE 0.9 % IV SOLN
INTRAVENOUS | Status: DC
  Administered 2018-06-28 – 2018-07-31 (×8): via INTRAVENOUS

## 2018-06-28 MED ORDER — PROPOFOL 10 MG/ML IV BOLUS
INTRAVENOUS | Status: AC
  Filled 2018-06-28: qty 20

## 2018-06-28 MED ORDER — KETAMINE HCL 50 MG/5ML IJ SOSY
PREFILLED_SYRINGE | INTRAMUSCULAR | Status: AC
  Filled 2018-06-28: qty 10

## 2018-06-28 MED ORDER — ENOXAPARIN SODIUM 40 MG/0.4ML ~~LOC~~ SOLN: 40.0000 mg | SUBCUTANEOUS | Status: DC

## 2018-06-28 MED ORDER — PROPOFOL 10 MG/ML IV BOLUS
INTRAVENOUS | Status: AC
  Filled 2018-06-28: qty 40

## 2018-06-28 MED ORDER — HYDROMORPHONE HCL 1 MG/ML IJ SOLN
0.2500 mg | INTRAMUSCULAR | Status: DC | PRN
  Administered 2018-06-28 (×4): 0.5 mg via INTRAVENOUS

## 2018-06-28 MED ORDER — DEXAMETHASONE SODIUM PHOSPHATE 10 MG/ML IJ SOLN
INTRAMUSCULAR | Status: DC | PRN
  Administered 2018-06-28: 8 mg via INTRAVENOUS

## 2018-06-28 MED ORDER — MORPHINE SULFATE 4 MG/ML IJ SOLN
INTRAMUSCULAR | Status: DC | PRN
  Administered 2018-06-28 (×2): 4 mg via INTRAMUSCULAR

## 2018-06-28 MED ORDER — VANCOMYCIN HCL 1000 MG IV SOLR
INTRAVENOUS | Status: DC | PRN
  Administered 2018-06-28: 1000 mg

## 2018-06-28 MED ORDER — METOCLOPRAMIDE HCL 5 MG PO TABS: 5.0000 mg | ORAL_TABLET | Freq: Three times a day (TID) | ORAL | Status: DC | PRN

## 2018-06-28 MED ORDER — ROCURONIUM BROMIDE 50 MG/5ML IV SOSY
PREFILLED_SYRINGE | INTRAVENOUS | Status: DC | PRN
  Administered 2018-06-28: 50 mg via INTRAVENOUS

## 2018-06-28 MED ORDER — TRAMADOL HCL 50 MG PO TABS
50.0000 mg | ORAL_TABLET | Freq: Four times a day (QID) | ORAL | Status: DC
  Administered 2018-06-29 – 2018-08-01 (×113): 50 mg via ORAL
  Filled 2018-06-28 (×119): qty 1

## 2018-06-28 MED ORDER — FENTANYL CITRATE (PF) 250 MCG/5ML IJ SOLN
INTRAMUSCULAR | Status: AC
  Filled 2018-06-28: qty 5

## 2018-06-28 MED ORDER — KETAMINE HCL 10 MG/ML IJ SOLN
INTRAMUSCULAR | Status: DC | PRN
  Administered 2018-06-28: 20 mg via INTRAVENOUS
  Administered 2018-06-28: 30 mg via INTRAVENOUS

## 2018-06-28 MED ORDER — DEXAMETHASONE SODIUM PHOSPHATE 10 MG/ML IJ SOLN
INTRAMUSCULAR | Status: AC
  Filled 2018-06-28: qty 1

## 2018-06-28 MED ORDER — LIDOCAINE 2% (20 MG/ML) 5 ML SYRINGE
INTRAMUSCULAR | Status: AC
  Filled 2018-06-28: qty 5

## 2018-06-28 MED ORDER — SODIUM CHLORIDE 0.9 % IR SOLN
Status: DC | PRN
  Administered 2018-06-28 (×2): 1000 mL

## 2018-06-28 MED ORDER — MIDAZOLAM HCL 2 MG/2ML IJ SOLN
INTRAMUSCULAR | Status: DC | PRN
  Administered 2018-06-28: 2 mg via INTRAVENOUS

## 2018-06-28 MED ORDER — ONDANSETRON HCL 4 MG/2ML IJ SOLN
INTRAMUSCULAR | Status: DC | PRN
  Administered 2018-06-28: 4 mg via INTRAVENOUS

## 2018-06-28 MED ORDER — SODIUM CHLORIDE 0.9 % IR SOLN
Status: DC | PRN
  Administered 2018-06-28: 3000 mL

## 2018-06-28 MED ORDER — LACTATED RINGERS IV SOLN
INTRAVENOUS | Status: DC
  Administered 2018-06-28: 16:00:00 via INTRAVENOUS

## 2018-06-28 MED ORDER — ROCURONIUM BROMIDE 10 MG/ML (PF) SYRINGE
PREFILLED_SYRINGE | INTRAVENOUS | Status: AC
  Filled 2018-06-28: qty 10

## 2018-06-28 MED ORDER — VANCOMYCIN HCL 1000 MG IV SOLR
INTRAVENOUS | Status: AC
  Filled 2018-06-28: qty 1000

## 2018-06-28 MED ORDER — CLONIDINE HCL (ANALGESIA) 100 MCG/ML EP SOLN
EPIDURAL | Status: DC | PRN
  Administered 2018-06-28: 1000 ug via INTRATHECAL

## 2018-06-28 MED ORDER — SUCCINYLCHOLINE CHLORIDE 200 MG/10ML IV SOSY
PREFILLED_SYRINGE | INTRAVENOUS | Status: AC
  Filled 2018-06-28: qty 10

## 2018-06-28 MED ORDER — ONDANSETRON HCL 4 MG/2ML IJ SOLN
INTRAMUSCULAR | Status: AC
  Filled 2018-06-28: qty 2

## 2018-06-28 MED ORDER — SUGAMMADEX SODIUM 200 MG/2ML IV SOLN
INTRAVENOUS | Status: DC | PRN
  Administered 2018-06-28: 200 mg via INTRAVENOUS

## 2018-06-28 MED ORDER — GENTAMICIN SULFATE 40 MG/ML IJ SOLN
INTRAMUSCULAR | Status: DC | PRN
  Administered 2018-06-28 (×3): 80 mg via INTRAMUSCULAR

## 2018-06-28 MED ORDER — ONDANSETRON HCL 4 MG/2ML IJ SOLN
4.0000 mg | Freq: Four times a day (QID) | INTRAMUSCULAR | Status: DC | PRN
  Administered 2018-07-13 – 2018-08-07 (×7): 4 mg via INTRAVENOUS
  Filled 2018-06-28 (×10): qty 2

## 2018-06-28 MED ORDER — BUPIVACAINE HCL (PF) 0.25 % IJ SOLN
INTRAMUSCULAR | Status: AC
  Filled 2018-06-28: qty 30

## 2018-06-28 SURGICAL SUPPLY — 92 items
ALCOHOL 70% 16 OZ (MISCELLANEOUS) ×3 IMPLANT
BANDAGE ACE 4X5 VEL STRL LF (GAUZE/BANDAGES/DRESSINGS) IMPLANT
BNDG COHESIVE 4X5 TAN STRL (GAUZE/BANDAGES/DRESSINGS) IMPLANT
BNDG GAUZE ELAST 4 BULKY (GAUZE/BANDAGES/DRESSINGS) IMPLANT
CLOSURE STERI-STRIP 1/4X4 (GAUZE/BANDAGES/DRESSINGS) ×3 IMPLANT
COVER SURGICAL LIGHT HANDLE (MISCELLANEOUS) ×3 IMPLANT
COVER WAND RF STERILE (DRAPES) ×3 IMPLANT
CUFF TOURNIQUET SINGLE 18IN (TOURNIQUET CUFF) IMPLANT
CUFF TOURNIQUET SINGLE 24IN (TOURNIQUET CUFF) IMPLANT
CUFF TOURNIQUET SINGLE 34IN LL (TOURNIQUET CUFF) IMPLANT
CUFF TOURNIQUET SINGLE 44IN (TOURNIQUET CUFF) IMPLANT
DRAIN CHANNEL 19F RND (DRAIN) ×4 IMPLANT
DRAPE C-ARM 42X72 X-RAY (DRAPES) ×3 IMPLANT
DRAPE INCISE IOBAN 66X45 STRL (DRAPES) ×6 IMPLANT
DRAPE ORTHO SPLIT 77X108 STRL (DRAPES) ×6
DRAPE STERI 35X30 U-POUCH (DRAPES) ×2 IMPLANT
DRAPE SURG ORHT 6 SPLT 77X108 (DRAPES) ×2 IMPLANT
DRAPE U-SHAPE 47X51 STRL (DRAPES) ×6 IMPLANT
DRSG AQUACEL AG ADV 3.5X 4 (GAUZE/BANDAGES/DRESSINGS) ×2 IMPLANT
DRSG AQUACEL AG ADV 3.5X 6 (GAUZE/BANDAGES/DRESSINGS) ×2 IMPLANT
DRSG AQUACEL AG ADV 3.5X10 (GAUZE/BANDAGES/DRESSINGS) ×2 IMPLANT
DRSG PAD ABDOMINAL 8X10 ST (GAUZE/BANDAGES/DRESSINGS) IMPLANT
DRSG TEGADERM 4X4.75 (GAUZE/BANDAGES/DRESSINGS) ×2 IMPLANT
DURAPREP 26ML APPLICATOR (WOUND CARE) ×3 IMPLANT
ELECT REM PT RETURN 9FT ADLT (ELECTROSURGICAL) ×3
ELECTRODE REM PT RTRN 9FT ADLT (ELECTROSURGICAL) ×1 IMPLANT
EVACUATOR 1/8 PVC DRAIN (DRAIN) ×2 IMPLANT
EVACUATOR SILICONE 100CC (DRAIN) ×6 IMPLANT
GAUZE SPONGE 4X4 12PLY STRL (GAUZE/BANDAGES/DRESSINGS) IMPLANT
GAUZE SPONGE 4X4 12PLY STRL LF (GAUZE/BANDAGES/DRESSINGS) ×2 IMPLANT
GAUZE XEROFORM 5X9 LF (GAUZE/BANDAGES/DRESSINGS) ×2 IMPLANT
GLOVE BIO SURGEON STRL SZ8 (GLOVE) ×2 IMPLANT
GLOVE BIOGEL PI IND STRL 7.5 (GLOVE) ×1 IMPLANT
GLOVE BIOGEL PI IND STRL 8 (GLOVE) ×1 IMPLANT
GLOVE BIOGEL PI INDICATOR 7.5 (GLOVE) ×2
GLOVE BIOGEL PI INDICATOR 8 (GLOVE) ×2
GLOVE ECLIPSE 7.0 STRL STRAW (GLOVE) ×3 IMPLANT
GLOVE SURG ORTHO 8.0 STRL STRW (GLOVE) ×3 IMPLANT
GLOVE SURG SS PI 6.5 STRL IVOR (GLOVE) ×2 IMPLANT
GOWN STRL REUS W/ TWL LRG LVL3 (GOWN DISPOSABLE) ×2 IMPLANT
GOWN STRL REUS W/ TWL XL LVL3 (GOWN DISPOSABLE) ×1 IMPLANT
GOWN STRL REUS W/TWL LRG LVL3 (GOWN DISPOSABLE) ×6
GOWN STRL REUS W/TWL XL LVL3 (GOWN DISPOSABLE) ×3
HANDPIECE INTERPULSE COAX TIP (DISPOSABLE)
KIT BASIN OR (CUSTOM PROCEDURE TRAY) ×3 IMPLANT
KIT STIMULAN RAPID CURE  10CC (Orthopedic Implant) ×2 IMPLANT
KIT STIMULAN RAPID CURE 10CC (Orthopedic Implant) IMPLANT
KIT TURNOVER KIT B (KITS) ×3 IMPLANT
MANIFOLD NEPTUNE II (INSTRUMENTS) ×3 IMPLANT
NDL 18GX1X1/2 (RX/OR ONLY) (NEEDLE) IMPLANT
NDL HYPO 25GX1X1/2 BEV (NEEDLE) IMPLANT
NEEDLE 18GX1X1/2 (RX/OR ONLY) (NEEDLE) ×6 IMPLANT
NEEDLE HYPO 25GX1X1/2 BEV (NEEDLE) ×3 IMPLANT
NS IRRIG 1000ML POUR BTL (IV SOLUTION) ×6 IMPLANT
PACK ORTHO EXTREMITY (CUSTOM PROCEDURE TRAY) ×3 IMPLANT
PACK SHOULDER (CUSTOM PROCEDURE TRAY) ×3 IMPLANT
PAD ARMBOARD 7.5X6 YLW CONV (MISCELLANEOUS) ×6 IMPLANT
PAD CAST 4YDX4 CTTN HI CHSV (CAST SUPPLIES) IMPLANT
PADDING CAST COTTON 4X4 STRL (CAST SUPPLIES)
SET HNDPC FAN SPRY TIP SCT (DISPOSABLE) IMPLANT
SLING ARM IMMOBILIZER LRG (SOFTGOODS) IMPLANT
SLING ARM IMMOBILIZER MED (SOFTGOODS) IMPLANT
SPONGE LAP 18X18 RF (DISPOSABLE) IMPLANT
SPONGE LAP 4X18 RFD (DISPOSABLE) ×3 IMPLANT
STOCKINETTE IMPERVIOUS 9X36 MD (GAUZE/BANDAGES/DRESSINGS) IMPLANT
SUCTION FRAZIER HANDLE 10FR (MISCELLANEOUS)
SUCTION TUBE FRAZIER 10FR DISP (MISCELLANEOUS) IMPLANT
SUT ETHILON 2 0 FS 18 (SUTURE) IMPLANT
SUT ETHILON 2 0 PSLX (SUTURE) ×4 IMPLANT
SUT ETHILON 3 0 PS 1 (SUTURE) ×2 IMPLANT
SUT MNCRL AB 4-0 PS2 18 (SUTURE) ×3 IMPLANT
SUT SILK 3 0 SH 30 (SUTURE) ×2 IMPLANT
SUT VIC AB 0 CT1 27 (SUTURE) ×3
SUT VIC AB 0 CT1 27XBRD ANBCTR (SUTURE) ×1 IMPLANT
SUT VIC AB 2-0 CT1 27 (SUTURE) ×6
SUT VIC AB 2-0 CT1 TAPERPNT 27 (SUTURE) ×1 IMPLANT
SUT VIC AB 2-0 CTB1 (SUTURE) ×4 IMPLANT
SUT VIC AB 3-0 SH 27 (SUTURE)
SUT VIC AB 3-0 SH 27X BRD (SUTURE) IMPLANT
SWAB COLLECTION DEVICE MRSA (MISCELLANEOUS) ×4 IMPLANT
SWAB CULTURE ESWAB REG 1ML (MISCELLANEOUS) ×4 IMPLANT
SWAB CULTURE LIQ STUART DBL (MISCELLANEOUS) IMPLANT
SYR 10ML LL (SYRINGE) ×4 IMPLANT
SYR CONTROL 10ML LL (SYRINGE) ×3 IMPLANT
SYSTEM CHEST DRAIN TLS 7FR (DRAIN) ×2 IMPLANT
TOWEL OR 17X24 6PK STRL BLUE (TOWEL DISPOSABLE) ×3 IMPLANT
TOWEL OR 17X26 10 PK STRL BLUE (TOWEL DISPOSABLE) ×3 IMPLANT
TUBE CONNECTING 12'X1/4 (SUCTIONS) ×1
TUBE CONNECTING 12X1/4 (SUCTIONS) ×2 IMPLANT
UNDERPAD 30X30 (UNDERPADS AND DIAPERS) ×3 IMPLANT
WATER STERILE IRR 1000ML POUR (IV SOLUTION) ×3 IMPLANT
YANKAUER SUCT BULB TIP NO VENT (SUCTIONS) ×3 IMPLANT

## 2018-06-28 NOTE — Anesthesia Procedure Notes (Signed)
Procedure Name: Intubation Date/Time: 06/28/2018 4:40 PM Performed by: Julian Reil, CRNA Pre-anesthesia Checklist: Patient identified, Emergency Drugs available, Suction available and Patient being monitored Patient Re-evaluated:Patient Re-evaluated prior to induction Oxygen Delivery Method: Circle system utilized Preoxygenation: Pre-oxygenation with 100% oxygen Induction Type: IV induction and Rapid sequence Laryngoscope Size: Miller and 3 Grade View: Grade I Tube type: Oral Tube size: 7.5 mm Number of attempts: 1 Airway Equipment and Method: Stylet Placement Confirmation: ETT inserted through vocal cords under direct vision,  positive ETCO2 and breath sounds checked- equal and bilateral Secured at: 23 cm Tube secured with: Tape Dental Injury: Teeth and Oropharynx as per pre-operative assessment  Comments: RSI due to Covid-19 pandemic concerns.  Noted 4 front upper blacked teeth at gum line.  All intact per patient report.  "I need to have them fixed".  4x4s bite block used.

## 2018-06-28 NOTE — Progress Notes (Signed)
Pharmacy Antibiotic Note  [name] is a 53 y.o. male admitted on 06/19/2018 with back pain and started on Rocephin for cellulitis.  Now concerned with septic joint and Pharmacy has been consulted for vancomycin dosing.  Patient is s/p aspiration.  Renal function continues to improve.  Afebrile, WBC down to 23.4. Aim for AUC 400-500. Will aim for lower end of therapeutic range given recent AKI and likely discharge soon.   Vancomycin continues for widely disseminated infection of his left knee, elbow, right shoulder, lumbo-sacral spine and right psoas abscess   ID recommends longer course of antibiotic,  at least 6 weeks of vancomycin.  Afebrile, WBC down to 19.5 (steroid use PTA)>17.5 SCr is 0.88, CrCl ~ 100 ml/min, UOP 1550 ml =0.9 ml/kg/hr.   RN reported Vancomycin trough drawn in wrong bottle and Vanc dose started, only infused  about 5-10 min. Thus stopped dose , redrew vanc trough about 30 minutes later & immediately resumed Vancomycin dose infusion at 13:00.  Vanc trough = 23 mcg/ml on 1g IV q12h.   Have learned that the vancomcin peak was not collected this AM.  Vanc trough likely >20 mcg/ml due to some of vanc dose infused.  Prior to vanc dose, would estimate trough likely ~ 20 mcg/ml, therapeutic.  I would check Vanc peak at 15:00 today however RN reports that patient is now in surgery for I&D of right shoulder.     Plan: Continue Vancomycin 1000mg  IV Q12hr Monitor renal fxn, clinical progress, vancomycin AUC. F/u post op and order vancomycin peak and trough for 5/21 to monitor vanc AUC.  Height: 5\' 10"  (177.8 cm) Weight: 165 lb (74.8 kg) IBW/kg (Calculated) : 73  Temp (24hrs), Avg:98.1 F (36.7 C), Min:97.9 F (36.6 C), Max:98.2 F (36.8 C)  Recent Labs  Lab 06/24/18 0427 06/25/18 0400 06/26/18 0530 06/27/18 0742 06/28/18 0317 06/28/18 1255  WBC 46.1* 34.5* 23.4* 19.5* 17.6*  --   CREATININE 1.14 1.00 0.91 0.91 0.88  --   VANCOTROUGH  --   --   --   --   --  23*     Estimated Creatinine Clearance: 100.2 mL/min (by C-G formula based on SCr of 0.88 mg/dL).    No Known Allergies   Vanc 5/15 >> CTX 5/12 >> 5/16  5/11 covid - negative 5/12 BCx - negative 5/14 synovial fluid L knee - negative 5/15 synovial fluid L knee - GPC on stain 5/20 BCx: sent  Noah Delaine, RPh Clinical Pharmacist Pager: [phone] Please check AMION for all [facility] phone numbers After 10:00 PM, call Main Pharmacy [phone] 06/28/2018, 3:32 PM

## 2018-06-28 NOTE — Anesthesia Preprocedure Evaluation (Signed)
Anesthesia Evaluation  Patient identified by MRN, date of birth, ID band Patient awake    Reviewed: Allergy & Precautions, NPO status , Patient's Chart, lab work & pertinent test results  Airway Mallampati: II  TM Distance: >3 FB Neck ROM: Full    Dental  (+) Poor Dentition, Dental Advisory Given   Pulmonary Current Smoker,    breath sounds clear to auscultation       Cardiovascular negative cardio ROS   Rhythm:Regular Rate:Normal     Neuro/Psych negative neurological ROS     GI/Hepatic hiatal hernia, GERD  ,(+)     substance abuse  cocaine use, Hepatitis -, C  Endo/Other  negative endocrine ROS  Renal/GU ARFRenal disease     Musculoskeletal Left knee septic arthritis   Abdominal   Peds  Hematology  (+) anemia ,   Anesthesia Other Findings Day of surgery medications reviewed with the patient.  Reproductive/Obstetrics                             Lab Results  Component Value Date   WBC 17.6 (H) 06/28/2018   HGB 8.4 (L) 06/28/2018   HCT 25.2 (L) 06/28/2018   MCV 91.3 06/28/2018   PLT 499 (H) 06/28/2018   Lab Results  Component Value Date   CREATININE 0.88 06/28/2018   BUN 23 (H) 06/28/2018   NA 129 (L) 06/28/2018   K 4.3 06/28/2018   CL 97 (L) 06/28/2018   CO2 26 06/28/2018    Anesthesia Physical  Anesthesia Plan  ASA: III  Anesthesia Plan: General   Post-op Pain Management:    Induction: Intravenous  PONV Risk Score and Plan: 2 and Treatment may vary due to age or medical condition, Ondansetron and Dexamethasone  Airway Management Planned: Oral ETT  Additional Equipment:   Intra-op Plan:   Post-operative Plan: Extubation in OR  Informed Consent: I have reviewed the patients History and Physical, chart, labs and discussed the procedure including the risks, benefits and alternatives for the proposed anesthesia with the patient or authorized representative who  has indicated his/her understanding and acceptance.     Dental advisory given  Plan Discussed with: CRNA and Anesthesiologist  Anesthesia Plan Comments:         Anesthesia Quick Evaluation

## 2018-06-28 NOTE — Progress Notes (Signed)
Patient ID: [name], male   DOB: 06-10-1965, 53 y.o.   MRN: [id number] Reviewed MRI scan and discussed with internist patient with back pain right lower extremity weakness and evidence of discitis osteomyelitis and so as abscesses.  I do not see he has any significant amount of epidural component or compression on the thecal sac or foramina however extensive psoas abscesses retroperitoneal abscesses and paraspinal abscesses.  Right lower extremity weakness is almost assuredly due to the extensive infection within the right psoas affecting both the psoas muscle femoral nerve and lumbar plexus.  There is no role for neurosurgery at this time continue aggressive medical treatment with broad antibiotic coverage and if needed consider interventional radiology aspiration of so as a retroperitoneal abscess for failed response.

## 2018-06-28 NOTE — Transfer of Care (Signed)
Immediate Anesthesia Transfer of Care Note  Patient: [name]  Procedure(s) Performed: IRRIGATION AND DEBRIDEMENT SHOULDER (Right Shoulder) ACROMIO-CLAVICULAR JOINT IRRIGATION AND DEBRIDEMENT (Right Shoulder)  Patient Location: PACU  Anesthesia Type:General  Level of Consciousness: awake and patient cooperative  Airway & Oxygen Therapy: Patient Spontanous Breathing and Patient connected to nasal cannula oxygen  Post-op Assessment: Report given to RN and Post -op Vital signs reviewed and stable  Post vital signs: Reviewed and stable  Last Vitals:  Vitals Value Taken Time  BP    Temp    Pulse 127 06/28/2018  6:47 PM  Resp 18 06/28/2018  6:47 PM  SpO2 88 % 06/28/2018  6:47 PM  Vitals shown include unvalidated device data.  Last Pain:  Vitals:   06/28/18 1300  TempSrc: Axillary  PainSc:       Patients Stated Pain Goal: 2 (06/28/18 0800)  Complications: No apparent anesthesia complications

## 2018-06-28 NOTE — Progress Notes (Signed)
PROGRESS NOTE    [name]  HEN:[id number] DOB: Mar 22, 1965 DOA: 06/19/2018 PCP: Patient, No Pcp Per    Brief Narrative: 53 year old with past medical history significant for hepatitis C and substance abuse who was admitted to the hospital on 06/19/2018 with complaints of back pain as well as right upper extremity cellulitis.  He had an MRI of the lumbar spine which showed disc disease and foraminal stenosis at L5-S1, neurological exam was intact, neurosurgery evaluated patient and recommended outpatient follow-up at that time.  During hospital stay patient developed left knee swelling, pain and redness.  Arthrocentesis which grew gram-positive cocci.  ID was consulted.  Patient was taken to the OR for left knee washout.  He also developed left elbow and right shoulder swelling and pain, however he could not tolerate MRI and disease appears to have been improving on their own.  Patient had a PICC line with plans for home antibiotics for 2 weeks per ID, however on 5/19 he developed new onset of right leg weakness along with worsening right shoulder pain.  Patient reports weakness of the right leg for 3 to 4 days now.   MRI of lumbar spine showed; Joint fluid and bone marrow edema compatible with L5-S1 discitis osteomyelitis, right L4-5 septic arthritis, and right sacroiliac joint septic arthritis. No bony destructive changes at this time. Multiloculated fluid collections compatible with abscess within the right-sided paraspinal muscles from L2-S1, right retroperitoneal space posterior to the posterior renal fascia, right psoas muscle, right iliacus muscle, right lateral pelvic floor muscles, right piriformis muscle, and the right-sided gluteus muscles extending below the field of view. Expansion the right L4-5 facet capsule from joint fluid effaces the right lateral recess and contacts the descending right L5 nerve root. Otherwise stable lumbar spondylosis greatest at L5-S1 where grade 1 anterolisthesis  contributes to moderate to severe bilateral foraminal stenosis.  CT right shoulder; Multiple, at least 7, intramuscular and interfascial abscesses about the right shoulder as described above. The largest abscess involving the triceps, teres minor, and latissimus dorsi muscles measures 5.3 x 5.0 x 10.5 cm.  Widening of the acromioclavicular joint with prominent fluid in the joint space. Septic arthritis is not excluded. No CT evidence of osteomyelitis.   Assessment & Plan:   Principal Problem:   Septic arthritis of multiple joints (HCC) Active Problems:   Hepatitis C antibody test positive   Cigarette smoker   Intractable back pain   AKI (acute kidney injury) (HCC)   Spinal stenosis of lumbosacral region   GERD (gastroesophageal reflux disease)   Mild protein-calorie malnutrition (HCC)   Septic arthritis of knee, left (HCC)   Acute medial meniscal tear, left, initial encounter   Polysubstance abuse (HCC)   Normocytic anemia   Effusion of left elbow   Recurrent boils   Discitis of lumbosacral region   Psoas abscess, right (HCC)   Multiple abscesses of right shoulder  1-left upper extremity cellulitis/sepsis complicated by superficial thrombosis and acute toxic metabolic encephalopathy: Initial blood cultures negative. Patient was initially started on IV vancomycin Ancef ceftriaxone.  Subsequently he was transitioned to only vancomycin, synovial fluid growing GPC -Right upper extremity Doppler was negative for DVT, but did show chronic superficial thrombosis, arterial Doppler X did not show any obstruction. -Left elbow pain he will need MRI to further evaluate.  Septic arthritis of the left knee,  Orthopedic was consulted, patient underwent knee aspiration which showed 19 K white blood cell with 81% neutrophils.  Cultures growing GPC. Patient underwent washout on  5/15. Patient had a 2D echo due to concern for high suspicious for bacteremia with did not show any vegetation. Plan is  to continue with IV vancomycin.  Right shoulder pain: Patient pain initially improved with IV antibiotics.  But subsequently on 519 he complained of worsening right shoulder pain.  Is not able to tolerate diet MRI so we proceeded with CT scanning of the shoulder. CT show multiple abscess.  Orthopedic is planning to take patient to the OR for incision and drainage today.  L 4-5 Discitis, L 5-S 1 discitis, multiple abscess paraspinal muscle, right retroperitoneal space, posterior to the renal fascia,  right piriformis muscle, and the right-sided gluteus muscles extending Intractable back pain, secondary to lumbar sacral spinal stenosis MRI completed on 06/13/2018 reviewed by neurosurgery recommend follow-up as an outpatient. -Patient developed worsening back pain on 5/19 and noticed to have worsening right leg weakness.Marland Kitchen  MRI show discitis and multiple abscess psoas muscle and retroperitoneal area.  I have discussed MRI results with infectious disease Dr. Orvan Falconer.  He is recommending at this time IV antibiotics.  No need to consult general surgery at this time for retroperitoneal area. Have also consulted on neurosurgery Dr. Wynetta Emery who will see patient in consultation. -Repeat blood cultures.  AKI: Patient creatinine on admission was 3.5.  Likely in the setting of sepsis.  Renal function has subsequently normalized with IV fluids.  Hyponatremia: Start IV fluids.    Estimated body mass index is 23.68 kg/m as calculated from the following:   Height as of this encounter:  (1.778 m).   Weight as of this encounter: 74.8 kg.   DVT prophylaxis: Lovenox Code Status: Full code Family Communication: Care discussed with patient Disposition Plan: Remain in the hospital for feeder IND, IV antibiotics.  Consultants:   ID  Neurosurgery   Procedures:  Left knee aspiration 5/15   Antimicrobials:  Vancomycin   Subjective: Patient still complaining of right shoulder pain.  He reports  right lower extremity weakness for the last 3 days.  Objective: Vitals:   06/27/18 2324 06/28/18 0308 06/28/18 0807 06/28/18 1300  BP: 140/77 (!) 144/70 124/76 118/75  Pulse: (!) 110 (!) 106 (!) 111 (!) 106  Resp: Temp: 98.1 F (36.7 C) 98 F (36.7 C) 98.2 F (36.8 C) 98.1 F (36.7 C)  TempSrc: Oral Oral Oral Axillary  SpO2: 99% 99% 98% 99%  Weight:      Height:        Intake/Output Summary (Last 24 hours) at 06/28/2018 1505 Last data filed at 06/28/2018 1300 Gross per 24 hour  Intake 966.09 ml  Output 800 ml  Net 166.09 ml   Filed Weights   06/19/18 1303 06/23/18 1708  Weight: 74.8 kg 74.8 kg    Examination:  General exam: Appears calm and comfortable  Respiratory system: Clear to auscultation. Respiratory effort normal. Cardiovascular system: S1 & S2 heard, RRR. No JVD, murmurs, rubs, gallops or clicks. No pedal edema. Gastrointestinal system: Abdomen is nondistended, soft and nontender. No organomegaly or masses felt. Normal bowel sounds heard. Central nervous system: Alert and oriented.  Right lower extremity 3 out of 5 Extremities: Symmetric 5 x 5 power. Skin: No rashes, lesions or ulcers Psychiatry: Judgement and insight appear normal. Mood & affect appropriate.     Data Reviewed: I have personally reviewed following labs and imaging studies  CBC: Recent Labs  Lab 06/24/18 0427 06/25/18 0400 06/26/18 0530 06/27/18 0742 06/28/18 0317  WBC 46.1* 34.5* 23.4*  19.5* 17.6*  HGB 9.8* 9.5* 9.3* 8.8* 8.4*  HCT 28.4* 28.8* 27.8* 26.6* 25.2*  MCV 90.4 92.0 92.1 92.0 91.3  PLT 320 383 437* 486* 499*   Basic Metabolic Panel: Recent Labs  Lab 06/24/18 0427 06/25/18 0400 06/26/18 0530 06/27/18 0742 06/28/18 0317  NA 133* 130* 128* 131* 129*  K 4.8 4.4 4.4 4.3 4.3  CL 101 101 103 97* 97*  CO2 21* GLUCOSE 106* 90 108* 104* 101*  BUN 36* 33* 29* 24* 23*  CREATININE 1.14 1.00 0.91 0.91 0.88  CALCIUM 7.6* 7.7* 7.9* 8.3* 8.1*    GFR: Estimated Creatinine Clearance: 100.2 mL/min (by C-G formula based on SCr of 0.88 mg/dL). Liver Function Tests: Recent Labs  Lab 06/24/18 0427  AST 24  ALT 27  ALKPHOS 94  BILITOT 1.1  PROT 5.8*  ALBUMIN 1.1*   No results for input(s): LIPASE, AMYLASE in the last 168 hours. No results for input(s): AMMONIA in the last 168 hours. Coagulation Profile: No results for input(s): INR, PROTIME in the last 168 hours. Cardiac Enzymes: No results for input(s): CKTOTAL, CKMB, CKMBINDEX, TROPONINI in the last 168 hours. BNP (last 3 results) No results for input(s): PROBNP in the last 8760 hours. HbA1C: No results for input(s): HGBA1C in the last 72 hours. CBG: No results for input(s): GLUCAP in the last 168 hours. Lipid Profile: No results for input(s): CHOL, HDL, LDLCALC, TRIG, CHOLHDL, LDLDIRECT in the last 72 hours. Thyroid Function Tests: No results for input(s): TSH, T4TOTAL, FREET4, T3FREE, THYROIDAB in the last 72 hours. Anemia Panel: No results for input(s): VITAMINB12, FOLATE, FERRITIN, TIBC, IRON, RETICCTPCT in the last 72 hours. Sepsis Labs: No results for input(s): PROCALCITON, LATICACIDVEN in the last 168 hours.  Recent Results (from the past 240 hour(s))  SARS Coronavirus 2 [facility] order, Performed in [facility] hospital lab)     Status: None   Collection Time: 06/19/18  2:12 PM  Result Value Ref Range Status   SARS Coronavirus 2 NEGATIVE NEGATIVE Final    Comment: (NOTE) If result is NEGATIVE SARS-CoV-2 target nucleic acids are NOT DETECTED. The SARS-CoV-2 RNA is generally detectable in upper and lower  respiratory specimens during the acute phase of infection. The lowest  concentration of SARS-CoV-2 viral copies this assay can detect is 250  copies / mL. A negative result does not preclude SARS-CoV-2 infection  and should not be used as the sole basis for treatment or other  patient management decisions.  A negative result may occur with  improper  specimen collection / handling, submission of specimen other  than nasopharyngeal swab, presence of viral mutation(s) within the  areas targeted by this assay, and inadequate number of viral copies  (<250 copies / mL). A negative result must be combined with clinical  observations, patient history, and epidemiological information. If result is POSITIVE SARS-CoV-2 target nucleic acids are DETECTED. The SARS-CoV-2 RNA is generally detectable in upper and lower  respiratory specimens dur ing the acute phase of infection.  Positive  results are indicative of active infection with SARS-CoV-2.  Clinical  correlation with patient history and other diagnostic information is  necessary to determine patient infection status.  Positive results do  not rule out bacterial infection or co-infection with other viruses. If result is PRESUMPTIVE POSTIVE SARS-CoV-2 nucleic acids MAY BE PRESENT.   A presumptive positive result was obtained on the submitted specimen  and confirmed on repeat testing.  While 2019 novel coronavirus  (SARS-CoV-2) nucleic  acids may be present in the submitted sample  additional confirmatory testing may be necessary for epidemiological  and / or clinical management purposes  to differentiate between  SARS-CoV-2 and other Sarbecovirus currently known to infect humans.  If clinically indicated additional testing with an alternate test  methodology [phone]) is advised. The SARS-CoV-2 RNA is generally  detectable in upper and lower respiratory sp ecimens during the acute  phase of infection. The expected result is Negative. Fact Sheet for Patients:  BoilerBrush.com.cy Fact Sheet for Healthcare Providers: https://pope.com/ This test is not yet approved or cleared by the [place] FDA and has been authorized for detection and/or diagnosis of SARS-CoV-2 by FDA under an Emergency Use Authorization (EUA).  This EUA will remain in  effect (meaning this test can be used) for the duration of the COVID-19 declaration under Section 564(b)(1) of the Act, 21 U.S.C. section 360bbb-3(b)(1), unless the authorization is terminated or revoked sooner. Performed at [facility], [address] [place], [place] [address]   Culture, blood (routine x 2)     Status: None   Collection Time: 06/20/18  9:00 AM  Result Value Ref Range Status   Specimen Description BLOOD RIGHT HAND  Final   Special Requests   Final    BOTTLES DRAWN AEROBIC ONLY Blood Culture results may not be optimal due to an inadequate volume of blood received in culture bottles   Culture   Final    NO GROWTH 5 DAYS Performed at [facility], [address] [place], [place] [address]    Report Status 06/25/2018 FINAL  Final  Culture, blood (routine x 2)     Status: None   Collection Time: 06/20/18  9:10 AM  Result Value Ref Range Status   Specimen Description BLOOD LEFT HAND  Final   Special Requests   Final    BOTTLES DRAWN AEROBIC ONLY Blood Culture adequate volume   Culture   Final    NO GROWTH 5 DAYS Performed at [facility], [address], [place], [place] [address]    Report Status 06/25/2018 FINAL  Final  Body fluid culture     Status: None   Collection Time: 06/22/18  1:05 PM  Result Value Ref Range Status   Specimen Description SYNOVIAL  Final   Special Requests KNEE  Final   Gram Stain   Final    MODERATE WBC PRESENT, PREDOMINANTLY PMN RARE GRAM POSITIVE COCCI IN PAIRS AND CHAINS    Culture   Final    NO GROWTH 4 DAYS Performed at [facility], [address] [place], [place] [address]    Report Status 06/26/2018 FINAL  Final  Surgical pcr screen     Status: Abnormal   Collection Time: 06/23/18  4:40 PM  Result Value Ref Range Status   MRSA, PCR NEGATIVE NEGATIVE Final   Staphylococcus aureus POSITIVE (A) NEGATIVE Final    Comment: (NOTE) The Xpert SA Assay (FDA approved for NASAL specimens in patients 22 years of  age and older), is one component of a comprehensive surveillance program. It is not intended to diagnose infection nor to guide or monitor treatment. Performed at [facility], [address] [place], [place] [address]   Aerobic/Anaerobic Culture (surgical/deep wound)     Status: None   Collection Time: 06/23/18  6:53 PM  Result Value Ref Range Status   Specimen Description SYNOVIAL LEFT KNEE  Final   Special Requests SWABS  Final  Gram Stain   Final    ABUNDANT WBC PRESENT, PREDOMINANTLY PMN RARE GRAM POSITIVE COCCI    Culture   Final    No growth aerobically or anaerobically. Performed at [facility], [address], [place], [place] [address]    Report Status 06/28/2018 FINAL  Final         Radiology Studies: Mr Lumbar Spine Wo Contrast  Result Date: 06/27/2018 CLINICAL DATA:  53 y/o M; history of substance abuse with several weeks of pain and multiple abscesses. EXAM: MRI LUMBAR SPINE WITHOUT CONTRAST TECHNIQUE: Multiplanar, multisequence MR imaging of the lumbar spine was performed. No intravenous contrast was administered. COMPARISON:  06/13/2018 lumbar spine MRI. FINDINGS: Segmentation:  Standard. Alignment: Stable L5-S1 grade 1 anterolisthesis secondary to chronic bilateral L5 pars defects. Vertebrae: There is increased disc signal and opposing endplate signal the L5-S1 level without vertebral body collapse. There is edema within the right L4-5 facets and joint effusion. There is a joint effusion within the right sacroiliac joint and edema in the opposing iliac bone and sacral ala. There are multiloculated fluid collections within the right-sided paraspinal muscles from L2-S1, retroperitoneal space posterior to the posterior renal fascia,, psoas muscle, iliacus muscle, right lateral pelvic floor muscles, piriformis muscle, and the right-sided gluteus muscles extending below the field of view. Conus medullaris and cauda equina: Conus extends to the L1 level. Conus  and cauda equina appear normal. Paraspinal and other soft tissues: As above. Disc levels: L1-2: No significant disc displacement, foraminal stenosis, or canal stenosis. L2-3: No significant disc displacement, foraminal stenosis, or canal stenosis. L3-4: Stable mild disc bulge. No significant foraminal or spinal canal stenosis. L4-5: Stable mild disc bulge and facet hypertrophy. Interval mild distention of the right facet synovial capsule narrowing the right lateral recess and contacting the descending right L5 nerve root. No significant foraminal or spinal canal stenosis. L5-S1: Grade 1 anterolisthesis, bilateral pars defects, and facet hypertrophy are stable resulting in moderate to severe bilateral foraminal stenosis. No significant spinal canal stenosis. IMPRESSION: 1. Joint fluid and bone marrow edema compatible with L5-S1 discitis osteomyelitis, right L4-5 septic arthritis, and right sacroiliac joint septic arthritis. No bony destructive changes at this time. 2. Multiloculated fluid collections compatible with abscess within the right-sided paraspinal muscles from L2-S1, right retroperitoneal space posterior to the posterior renal fascia, right psoas muscle, right iliacus muscle, right lateral pelvic floor muscles, right piriformis muscle, and the right-sided gluteus muscles extending below the field of view. 3. Expansion the right L4-5 facet capsule from joint fluid effaces the right lateral recess and contacts the descending right L5 nerve root. 4. Otherwise stable lumbar spondylosis greatest at L5-S1 where grade 1 anterolisthesis contributes to moderate to severe bilateral foraminal stenosis. Electronically Signed   By: Mitzi Hansen M.D.   On: 06/27/2018 19:24   Ct Shoulder Right W Contrast  Result Date: 06/27/2018 CLINICAL DATA:  Worsening right shoulder pain. Recent left knee septic arthritis. EXAM: CT OF THE UPPER RIGHT EXTREMITY WITH CONTRAST TECHNIQUE: Multidetector CT imaging of the  upper right extremity was performed according to the standard protocol following intravenous contrast administration. COMPARISON:  Right shoulder x-rays dated Jun 19, 2018. CONTRAST:  OMNIPAQUE IOHEXOL 300 MG/ML  SOLN FINDINGS: Bones/Joint/Cartilage No acute fracture or dislocation. No osseous destruction or periosteal reaction. Mild widening of the acromioclavicular joint with fluid in the joint space. The glenohumeral joint space is preserved. No significant glenohumeral joint effusion. There is a small amount of air within the glenohumeral joint  space. Ligaments Suboptimally assessed by CT. Muscles and Tendons There are multiple rim enhancing intramuscular fluid collections about the right shoulder involving the anterior and posterior deltoid, pectoralis minor, biceps, triceps, subscapularis, teres major, and latissimus dorsi muscles. There is also a rim enhancing interfascial fluid collection in between the posterior deltoid and infraspinatus muscles. The largest collection involving the triceps, teres minor, and latissimus dorsi muscles measures approximately 5.3 x 5.0 x 10.5 cm (AP by transverse by CC). Soft tissues 1.3 x 0.7 cm lobulated low-density nodule in the right upper lobe (series 3, image 74) likely represents mucoid impaction. IMPRESSION: 1. Multiple, at least 7, intramuscular and interfascial abscesses about the right shoulder as described above. The largest abscess involving the triceps, teres minor, and latissimus dorsi muscles measures 5.3 x 5.0 x 10.5 cm. 2. Widening of the acromioclavicular joint with prominent fluid in the joint space. Septic arthritis is not excluded. No CT evidence of osteomyelitis. 3. No significant glenohumeral joint effusion. Electronically Signed   By: Obie DredgeWilliam T Derry M.D.   On: 06/27/2018 14:05        Scheduled Meds:  Chlorhexidine Gluconate Cloth  6 each Topical Daily   diphenhydrAMINE  25 mg Oral Once   docusate sodium  100 mg Oral BID    enoxaparin (LOVENOX) injection  40 mg Subcutaneous Q24H   esomeprazole  20 mg Oral QAC breakfast   folic acid  1 mg Oral Daily   methocarbamol  500 mg Oral TID   multivitamin with minerals  1 tablet Oral Daily   mupirocin ointment  1 application Nasal BID   sodium chloride flush  10-40 mL Intracatheter Q12H   sucralfate  1 g Oral TID WC & HS   thiamine  100 mg Oral Daily   Or   thiamine  100 mg Intravenous Daily   traMADol  50 mg Oral Q6H   Continuous Infusions:  sodium chloride Stopped (06/28/18 1209)   methocarbamol (ROBAXIN) IV     vancomycin 200 mL/hr at 06/28/18 1437     LOS: 8 days    Time spent: 35 minutes.     Alba CoryBelkys A Lochlann Mastrangelo, MD [facility] Pager [phone]  If 7PM-7AM, please contact night-coverage [email or web address] Password [email or web address] 06/28/2018, 3:05 PM

## 2018-06-28 NOTE — Progress Notes (Signed)
OT Cancellation Note  Patient Details Name: [name] MRN: [id number] DOB: 09-Nov-1965   Cancelled Treatment:    Reason Eval/Treat Not Completed: Medical issues which prohibited therapy.  Pt plan to return to OR for I&D this afternoon.  Will hold OT today and reassess tomorrow as appropriate.  Will follow.   Chancy Milroy, OT Acute Rehabilitation Services Pager [phone] Office [phone]   Chancy Milroy 06/28/2018, 12:05 PM

## 2018-06-28 NOTE — Progress Notes (Signed)
[facility]  Date of Admission:  06/19/2018     Antibiotic Days: 9  Day 6 vancomycin      ASSESSMENT: We were notified yesterday of new findings on Mr. Bailie scans - he has lumbosacral vertebral infection as well as right sacroiliac joint septic arthritis, multiple abscesses within the right-sided paraspinal muscles from L2-S1 as well as right retroperitoneal space, right psoas muscle, right iliac muscle, right lateral pelvic floor muscles, right piriformis muscle and right gluteal muscles. He is also awaiting surgical I&D this afternoon for multiple abscess around the right shoulder musculature. His left elbow is still awaiting imaging to evaluate for possible intervention here as well. At this point it would be likely a very low yield to have any cultures from these abscess sites sent being he has been on antibiotics for 9 days now. Given his previous gram stains indicating gram positive cocci would continue with vancomycin therapy alone for his treatment. These other infected sites, although just realized on imaging, were likely present since admission and only more obvious now that we have had to mobilize him more following his knee surgery during recovery.   Given the new findings, he will need longer course of therapy 6-8 weeks once all sites that can be cleaned out are. Agree with imaging left elbow. Right sh   PLAN: 1. Continue vancomycin  2. Follow AUC  3. Follow any micro data pending from surgery  4. End date of therapy to be determined as of now.    Principal Problem:   Septic arthritis of multiple joints (HCC) Active Problems:   Septic arthritis of knee, left (HCC)   Effusion of left elbow   Discitis of lumbosacral region   Psoas abscess, right (HCC)   Multiple abscesses of right shoulder   Hepatitis C antibody test positive   Polysubstance abuse (HCC)   Cigarette smoker   Intractable back pain   AKI (acute kidney injury)  (HCC)   Spinal stenosis of lumbosacral region   GERD (gastroesophageal reflux disease)   Mild protein-calorie malnutrition (HCC)   Acute medial meniscal tear, left, initial encounter   Normocytic anemia   Recurrent boils   Scheduled Meds: . Chlorhexidine Gluconate Cloth  6 each Topical Daily  . diphenhydrAMINE  25 mg Oral Once  . docusate sodium  100 mg Oral BID  . enoxaparin (LOVENOX) injection  40 mg Subcutaneous Q24H  . esomeprazole  20 mg Oral QAC breakfast  . folic acid  1 mg Oral Daily  . methocarbamol  500 mg Oral TID  . multivitamin with minerals  1 tablet Oral Daily  . mupirocin ointment  1 application Nasal BID  . sodium chloride flush  10-40 mL Intracatheter Q12H  . sucralfate  1 g Oral TID WC & HS  . thiamine  100 mg Oral Daily   Or  . thiamine  100 mg Intravenous Daily  . traMADol  50 mg Oral Q6H   Continuous Infusions: . sodium chloride Stopped (06/28/18 1209)  . methocarbamol (ROBAXIN) IV    . vancomycin 200 mL/hr at 06/28/18 1437   PRN Meds:.acetaminophen **OR** acetaminophen, alum & mag hydroxide-simeth, HYDROcodone-acetaminophen, methocarbamol **OR** methocarbamol (ROBAXIN) IV, metoCLOPramide **OR** metoCLOPramide (REGLAN) injection, morphine injection, ondansetron **OR** ondansetron (ZOFRAN) IV, polyethylene glycol, sodium chloride flush   SUBJECTIVE: Awaiting further debridement of R shoulder today with Dr. August Saucer. Unable to raise his RLE off the bed due to weakness. No complaints/concerns with antibiotics or  side effects. Just hungry and disappointed today.   Review of Systems: Review of Systems  Constitutional: Negative for chills, diaphoresis, fever, malaise/fatigue and weight loss.  Respiratory: Negative for cough, sputum production and shortness of breath.   Cardiovascular: Negative for chest pain.  Gastrointestinal: Positive for heartburn. Negative for abdominal pain, diarrhea, nausea and vomiting.  Musculoskeletal: Positive for back pain and joint  pain.  Skin: Negative for itching and rash.  Neurological: Positive for weakness. Focal weakness: R leg   Psychiatric/Behavioral: Positive for substance abuse (cocaine use).    No Known Allergies  OBJECTIVE: Vitals:   06/27/18 2324 06/28/18 0308 06/28/18 0807 06/28/18 1300  BP: 140/77 (!) 144/70 124/76 118/75  Pulse: (!) 110 (!) 106 (!) 111 (!) 106  Resp: 18 18 18 17   Temp: 98.1 F (36.7 C) 98 F (36.7 C) 98.2 F (36.8 C) 98.1 F (36.7 C)  TempSrc: Oral Oral Oral Axillary  SpO2: 99% 99% 98% 99%  Weight:      Height:       Body mass index is 23.68 kg/m.  Physical Exam Constitutional:      Comments: Resting in bed. Smiling and pleasant.   Cardiovascular:     Rate and Rhythm: Normal rate and regular rhythm.     Heart sounds: No murmur.  Pulmonary:     Effort: Pulmonary effort is normal.     Breath sounds: Normal breath sounds.  Abdominal:     Palpations: Abdomen is soft.     Tenderness: There is no abdominal tenderness.  Musculoskeletal:     Comments: Unable to raise his right leg off the bed to gravity. R shoulder painful and swollen. Left elbow with more swelling compared to last assessment. Left knee incisions healing and looks of normal color/size/temperature  Skin:    Findings: No rash.  Psychiatric:        Mood and Affect: Mood normal.     Lab Results Lab Results  Component Value Date   WBC 17.6 (H) 06/28/2018   HGB 8.4 (L) 06/28/2018   HCT 25.2 (L) 06/28/2018   MCV 91.3 06/28/2018   PLT 499 (H) 06/28/2018    Lab Results  Component Value Date   CREATININE 0.88 06/28/2018   BUN 23 (H) 06/28/2018   NA 129 (L) 06/28/2018   K 4.3 06/28/2018   CL 97 (L) 06/28/2018   CO2 26 06/28/2018    Lab Results  Component Value Date   ALT 27 06/24/2018   AST 24 06/24/2018   ALKPHOS 94 06/24/2018   BILITOT 1.1 06/24/2018     Microbiology: Recent Results (from the past 240 hour(s))  SARS Coronavirus 2 [facility](Hospital order, Performed in [facility] hospital lab)      Status: None   Collection Time: 06/19/18  2:12 PM  Result Value Ref Range Status   SARS Coronavirus 2 NEGATIVE NEGATIVE Final    Comment: (NOTE) If result is NEGATIVE SARS-CoV-2 target nucleic acids are NOT DETECTED. The SARS-CoV-2 RNA is generally detectable in upper and lower  respiratory specimens during the acute phase of infection. The lowest  concentration of SARS-CoV-2 viral copies this assay can detect is 250  copies / mL. A negative result does not preclude SARS-CoV-2 infection  and should not be used as the sole basis for treatment or other  patient management decisions.  A negative result may occur with  improper specimen collection / handling, submission of specimen other  than nasopharyngeal swab, presence of viral mutation(s) within the  areas targeted  by this assay, and inadequate number of viral copies  (<250 copies / mL). A negative result must be combined with clinical  observations, patient history, and epidemiological information. If result is POSITIVE SARS-CoV-2 target nucleic acids are DETECTED. The SARS-CoV-2 RNA is generally detectable in upper and lower  respiratory specimens dur ing the acute phase of infection.  Positive  results are indicative of active infection with SARS-CoV-2.  Clinical  correlation with patient history and other diagnostic information is  necessary to determine patient infection status.  Positive results do  not rule out bacterial infection or co-infection with other viruses. If result is PRESUMPTIVE POSTIVE SARS-CoV-2 nucleic acids MAY BE PRESENT.   A presumptive positive result was obtained on the submitted specimen  and confirmed on repeat testing.  While 2019 novel coronavirus  (SARS-CoV-2) nucleic acids may be present in the submitted sample  additional confirmatory testing may be necessary for epidemiological  and / or clinical management purposes  to differentiate between  SARS-CoV-2 and other Sarbecovirus currently known to  infect humans.  If clinically indicated additional testing with an alternate test  methodology [phone]) is advised. The SARS-CoV-2 RNA is generally  detectable in upper and lower respiratory sp ecimens during the acute  phase of infection. The expected result is Negative. Fact Sheet for Patients:  BoilerBrush.com.cy Fact Sheet for Healthcare Providers: https://pope.com/ This test is not yet approved or cleared by the [place] FDA and has been authorized for detection and/or diagnosis of SARS-CoV-2 by FDA under an Emergency Use Authorization (EUA).  This EUA will remain in effect (meaning this test can be used) for the duration of the COVID-19 declaration under Section 564(b)(1) of the Act, 21 U.S.C. section 360bbb-3(b)(1), unless the authorization is terminated or revoked sooner. Performed at [facility], [address] [place], [place] [address]   Culture, blood (routine x 2)     Status: None   Collection Time: 06/20/18  9:00 AM  Result Value Ref Range Status   Specimen Description BLOOD RIGHT HAND  Final   Special Requests   Final    BOTTLES DRAWN AEROBIC ONLY Blood Culture results may not be optimal due to an inadequate volume of blood received in culture bottles   Culture   Final    NO GROWTH 5 DAYS Performed at [facility], [address], [place], [place] [address]    Report Status 06/25/2018 FINAL  Final  Culture, blood (routine x 2)     Status: None   Collection Time: 06/20/18  9:10 AM  Result Value Ref Range Status   Specimen Description BLOOD LEFT HAND  Final   Special Requests   Final    BOTTLES DRAWN AEROBIC ONLY Blood Culture adequate volume   Culture   Final    NO GROWTH 5 DAYS Performed at [facility], [address], [place], [place] [address]    Report Status 06/25/2018 FINAL  Final  Body fluid culture     Status: None   Collection Time: 06/22/18  1:05 PM  Result Value Ref Range Status    Specimen Description SYNOVIAL  Final   Special Requests KNEE  Final   Gram Stain   Final    MODERATE WBC PRESENT, PREDOMINANTLY PMN RARE GRAM POSITIVE COCCI IN PAIRS AND CHAINS    Culture   Final    NO GROWTH 4 DAYS Performed at [facility], [address], [place], [place] [address]    Report Status 06/26/2018 FINAL  Final  Surgical pcr screen     Status: Abnormal   Collection Time: 06/23/18  4:40 PM  Result Value Ref Range Status   MRSA, PCR NEGATIVE NEGATIVE Final   Staphylococcus aureus POSITIVE (A) NEGATIVE Final    Comment: (NOTE) The Xpert SA Assay (FDA approved for NASAL specimens in patients 26 years of age and older), is one component of a comprehensive surveillance program. It is not intended to diagnose infection nor to guide or monitor treatment. Performed at [facility], [address] [place], [place] [address]   Aerobic/Anaerobic Culture (surgical/deep wound)     Status: None   Collection Time: 06/23/18  6:53 PM  Result Value Ref Range Status   Specimen Description SYNOVIAL LEFT KNEE  Final   Special Requests SWABS  Final   Gram Stain   Final    ABUNDANT WBC PRESENT, PREDOMINANTLY PMN RARE GRAM POSITIVE COCCI    Culture   Final    No growth aerobically or anaerobically. Performed at [facility], [address], [place], [place] [address]    Report Status 06/28/2018 FINAL  Final     Rexene Alberts, MSN, NP-C [facility]  [email or web address] Pager: [phone] Office: [phone] [facility] Main Line: [phone]

## 2018-06-28 NOTE — Anesthesia Postprocedure Evaluation (Signed)
Anesthesia Post Note  Patient: [name]  Procedure(s) Performed: IRRIGATION AND DEBRIDEMENT SHOULDER (Right Shoulder) ACROMIO-CLAVICULAR JOINT IRRIGATION AND DEBRIDEMENT (Right Shoulder)     Patient location during evaluation: PACU Anesthesia Type: General Level of consciousness: awake and alert and awake Pain management: pain level controlled Vital Signs Assessment: post-procedure vital signs reviewed and stable Respiratory status: spontaneous breathing, nonlabored ventilation, respiratory function stable and patient connected to nasal cannula oxygen Cardiovascular status: blood pressure returned to baseline, stable and tachycardic Postop Assessment: no apparent nausea or vomiting Anesthetic complications: no    Last Vitals:  Vitals:   06/28/18 1929 06/28/18 1959  BP: 115/75 118/81  Pulse: (!) 118 (!) 117  Resp: 10 18  Temp:  (!) 36.4 C  SpO2: 99% 98%    Last Pain:  Vitals:   06/28/18 1959  TempSrc: Oral  PainSc:                  Cecile Hearing

## 2018-06-28 NOTE — Progress Notes (Signed)
PT Cancellation Note  Patient Details Name: [name] MRN: [id number] DOB: 1966/01/01   Cancelled Treatment:    Reason Eval/Treat Not Completed: Medical issues which prohibited therapy. Noted pt to return to OR for I&D this afternoon. Will hold PT at this time and reassess tomorrow for appropriateness to participate. Will likely need re-evaluation as it has been a week since pt has been able to mobilize with PT.    [name] 06/28/2018, 12:01 PM  [name], PT, DPT Acute Rehabilitation Services Pager: [phone] Office: [phone]

## 2018-06-28 NOTE — Progress Notes (Signed)
Patient had CT scan of right shoulder.  Shows multiple abscesses as well as widening of the AC joint.  Does not look like there is a joint effusion in the septic arthritis of the glenohumeral joint less likely.  Plan at this time is I&D of anterior abscess posterior abscess as well as the [facility] joint.  Lovenox has been held.  We also need further imaging of the left elbow which ideally would be MRI scan.

## 2018-06-28 NOTE — Brief Op Note (Signed)
   06/19/2018 - 06/28/2018  6:36 PM  PATIENT:  [name]  53 y.o. male  PRE-OPERATIVE DIAGNOSIS:  Shoulder pain/abscesses  POST-OPERATIVE DIAGNOSIS:  Shoulder pain/abscesses  PROCEDURE:  Procedure(s): IRRIGATION AND DEBRIDEMENT SHOULDER ACROMIO-CLAVICULAR JOINT IRRIGATION AND DEBRIDEMENT  SURGEON:  Surgeon(s): Cammy Copa, MD  ASSISTANT: April green rnfa  ANESTHESIA:   general  EBL: 75 ml    Total I/O In: 1066.1 [I.V.:1035.5; IV Piggyback:30.6] Out: 1200 [Urine:1200]  BLOOD ADMINISTERED: none  DRAINS: (Anterior and posterior deltoid region with TLSO in the [facility] joint) Jackson-Pratt drain(s) with closed bulb suction in the Right shoulder joint and AC joint   LOCAL MEDICATIONS USED:  none  SPECIMEN: Posterior abscess sent for culture x1 Anterior abscess sent for culture x1 AC joint fluid and bone sent for culture and pathology x1 respectively  COUNTS:  YES  TOURNIQUET:  * No tourniquets in log *  DICTATION: .Other Dictation: Dictation Number [id number]  PLAN OF CARE: Admit to inpatient   PATIENT DISPOSITION:  PACU - hemodynamically stable

## 2018-06-28 NOTE — Progress Notes (Signed)
Patient taken to surgery at 1450hrs via bed.

## 2018-06-28 NOTE — Progress Notes (Addendum)
Patient with multiple shoulder abscesses as well as possible AC joint involvement.  In discussion with the radiologist the glenohumeral joint does not appear to be involved with this process.  Multiple abscesses anterior and posterior around the shoulder girdle are present.  Plan at this time is open incision and drainage of anterior and posterior shoulder muscle abscesses along with open I&D of the [facility] joint.  Risk and benefits are discussed with the patient including but not limited to infection nerve vessel damage persistent infection as well as shoulder stiffness.  Patient understands risk and benefits.  All questions answered  His left elbow has full range of motion and is clinically significantly improved compared to where it was this weekend.  For that reason I do not think he needs an MRI scan of the left elbow.

## 2018-06-29 ENCOUNTER — Encounter (HOSPITAL_COMMUNITY): Payer: Self-pay | Admitting: Orthopedic Surgery

## 2018-06-29 LAB — CBC
HCT: 24 % — ABNORMAL LOW (ref 39.0–52.0)
Hemoglobin: 8 g/dL — ABNORMAL LOW (ref 13.0–17.0)
MCH: 30.8 pg (ref 26.0–34.0)
MCHC: 33.3 g/dL (ref 30.0–36.0)
MCV: 92.3 fL (ref 80.0–100.0)
Platelets: 488 10*3/uL — ABNORMAL HIGH (ref 150–400)
RBC: 2.6 MIL/uL — ABNORMAL LOW (ref 4.22–5.81)
RDW: 15.2 % (ref 11.5–15.5)
WBC: 17.3 10*3/uL — ABNORMAL HIGH (ref 4.0–10.5)
nRBC: 0 % (ref 0.0–0.2)

## 2018-06-29 LAB — BASIC METABOLIC PANEL
Anion gap: 8 (ref 5–15)
BUN: 26 mg/dL — ABNORMAL HIGH (ref 6–20)
CO2: 25 mmol/L (ref 22–32)
Calcium: 8.4 mg/dL — ABNORMAL LOW (ref 8.9–10.3)
Chloride: 96 mmol/L — ABNORMAL LOW (ref 98–111)
Creatinine, Ser: 0.93 mg/dL (ref 0.61–1.24)
GFR calc Af Amer: >60 mL/min (ref >60)
GFR calc non Af Amer: >60 mL/min (ref >60)
Glucose, Bld: 148 mg/dL — ABNORMAL HIGH (ref 70–99)
Potassium: 4.7 mmol/L (ref 3.5–5.1)
Sodium: 129 mmol/L — ABNORMAL LOW (ref 135–145)

## 2018-06-29 LAB — VANCOMYCIN, TROUGH: Vancomycin Tr: 19 ug/mL (ref 15–20)

## 2018-06-29 LAB — VANCOMYCIN, PEAK: Vancomycin Pk: 24 ug/mL — ABNORMAL LOW (ref 30–40)

## 2018-06-29 LAB — HEPATITIS C GENOTYPE: HCV Genotype: 3

## 2018-06-29 MED ORDER — VANCOMYCIN HCL IN DEXTROSE 750-5 MG/150ML-% IV SOLN
750.0000 mg | Freq: Two times a day (BID) | INTRAVENOUS | Status: AC
  Administered 2018-06-29 – 2018-08-09 (×83): 750 mg via INTRAVENOUS
  Filled 2018-06-29 (×83): qty 150

## 2018-06-29 MED ORDER — ENOXAPARIN SODIUM 40 MG/0.4ML ~~LOC~~ SOLN
40.0000 mg | Freq: Every day | SUBCUTANEOUS | Status: DC
  Administered 2018-06-30 – 2018-08-09 (×28): 40 mg via SUBCUTANEOUS
  Filled 2018-06-29 (×30): qty 0.4

## 2018-06-29 MED ORDER — PANTOPRAZOLE SODIUM 40 MG PO TBEC: 40.0000 mg | DELAYED_RELEASE_TABLET | Freq: Once | ORAL | Status: DC

## 2018-06-29 MED ORDER — VANCOMYCIN IV (FOR PTA / DISCHARGE USE ONLY): 750.0000 mg | Freq: Two times a day (BID) | INTRAVENOUS | 0 refills | Status: AC

## 2018-06-29 NOTE — Plan of Care (Signed)
Progressing toward goals. 

## 2018-06-29 NOTE — Progress Notes (Addendum)
PHARMACY CONSULT NOTE FOR:  OUTPATIENT  PARENTERAL ANTIBIOTIC THERAPY (OPAT)  Indication: Septic joint/discitis  Regimen: Vancomycin 750 mg Q 12 hours  End date: 08/09/2018  IV antibiotic discharge orders are pended. To discharging provider:  please sign these orders via discharge navigator,  Select New Orders & click on the button choice - Manage This Unsigned Work.    Thank you for allowing pharmacy to be a part of this patient's care.  Lenord Carbo, PharmD PGY1 Pharmacy Resident Phone: [phone]  Please check AMION for all [facility] phone numbers 06/29/2018, 2:08 PM

## 2018-06-29 NOTE — Progress Notes (Signed)
PROGRESS NOTE    [name]  ZOX:[id number] DOB: 11-10-1965 DOA: 06/19/2018 PCP: Patient, No Pcp Per    Brief Narrative: 53 year old with past medical history significant for hepatitis C and substance abuse who was admitted to the hospital on 06/19/2018 with complaints of back pain as well as right upper extremity cellulitis.  He had an MRI of the lumbar spine which showed disc disease and foraminal stenosis at L5-S1, neurological exam was intact, neurosurgery evaluated patient and recommended outpatient follow-up at that time.  During hospital stay patient developed left knee swelling, pain and redness.  Arthrocentesis which grew gram-positive cocci.  ID was consulted.  Patient was taken to the OR for left knee washout.  He also developed left elbow and right shoulder swelling and pain, however he could not tolerate MRI and disease appears to have been improving on their own.  Patient had a PICC line with plans for home antibiotics for 2 weeks per ID, however on 5/19 he developed new onset of right leg weakness along with worsening right shoulder pain.  Patient reports weakness of the right leg for 3 to 4 days now.   MRI of lumbar spine showed; Joint fluid and bone marrow edema compatible with L5-S1 discitis osteomyelitis, right L4-5 septic arthritis, and right sacroiliac joint septic arthritis. No bony destructive changes at this time. Multiloculated fluid collections compatible with abscess within the right-sided paraspinal muscles from L2-S1, right retroperitoneal space posterior to the posterior renal fascia, right psoas muscle, right iliacus muscle, right lateral pelvic floor muscles, right piriformis muscle, and the right-sided gluteus muscles extending below the field of view. Expansion the right L4-5 facet capsule from joint fluid effaces the right lateral recess and contacts the descending right L5 nerve root. Otherwise stable lumbar spondylosis greatest at L5-S1 where grade 1 anterolisthesis  contributes to moderate to severe bilateral foraminal stenosis.  CT right shoulder; Multiple, at least 7, intramuscular and interfascial abscesses about the right shoulder as described above. The largest abscess involving the triceps, teres minor, and latissimus dorsi muscles measures 5.3 x 5.0 x 10.5 cm.  Widening of the acromioclavicular joint with prominent fluid in the joint space. Septic arthritis is not excluded. No CT evidence of osteomyelitis.   Assessment & Plan:   Principal Problem:   Septic arthritis of multiple joints (HCC) Active Problems:   Hepatitis C antibody test positive   Cigarette smoker   Intractable back pain   AKI (acute kidney injury) (HCC)   Spinal stenosis of lumbosacral region   GERD (gastroesophageal reflux disease)   Mild protein-calorie malnutrition (HCC)   Septic arthritis of knee, left (HCC)   Acute medial meniscal tear, left, initial encounter   Polysubstance abuse (HCC)   Normocytic anemia   Effusion of left elbow   Recurrent boils   Discitis of lumbosacral region   Psoas abscess, right (HCC)   Multiple abscesses of right shoulder   Chronic infection of left knee (HCC)  1-left upper extremity cellulitis/sepsis complicated by superficial thrombosis and acute toxic metabolic encephalopathy: Initial blood cultures negative. Patient was initially started on IV vancomycin Ancef ceftriaxone.  Subsequently he was transitioned to only vancomycin, synovial fluid growing GPC -Right upper extremity Doppler was negative for DVT, but did show chronic superficial thrombosis, arterial Doppler X did not show any obstruction. -Left elbow pain has improved. Monitor pain to determine need for MRI.   Septic arthritis of the left knee,  Orthopedic was consulted, patient underwent knee aspiration which showed 19 K white blood cell  with 81% neutrophils.  Cultures growing GPC. Patient underwent washout on 5/15. Patient had a 2D echo due to concern for high suspicious  for bacteremia with did not show any vegetation. Plan is to continue with IV vancomycin.  Right shoulder pain: Patient pain initially improved with IV antibiotics.  But subsequently on 5-19 he complained of worsening right shoulder pain. He was not able to tolerate  MRI so we proceeded with CT scanning of the shoulder. CT show multiple abscess.   Patient underwent right shoulder abscess incision and debridement of the posterior abscess, anterior abscess, AC joint  on 5/25 03/2018. Patient currently have 3 drains in place right shoulder, draining bloody fluid. Treating for presumable poorly disseminated Staphylococcus infection.  L 4-5 Discitis, L 5-S 1 discitis, multiple abscess paraspinal muscle, right retroperitoneal space, posterior to the renal fascia,  right piriformis muscle, and the right-sided gluteus muscles extending Intractable back pain, secondary to lumbar sacral spinal stenosis MRI completed on 06/13/2018 reviewed by neurosurgery recommend follow-up as an outpatient. -Patient developed worsening back pain on 5/19 and noticed to have worsening right leg weakness.Marland Kitchen  MRI show discitis and multiple abscess psoas muscle and retroperitoneal area.  I have discussed MRI results with infectious disease Dr. Orvan Falconer.  He is recommending at this time IV antibiotics.  No need to consult general surgery at this time for retroperitoneal area. Have also consulted on neurosurgery Dr. Wynetta Emery who recommended IV antibiotics, no need for lumbar spine surgery.  Could consider aspiration of retroperitoneal abscess for failed response.   -Repeated  blood cultures 5/20; pending -Follow white count and monitor clinically back pain.  If pain persists or get worse might need repeated MRI.  AKI: Patient creatinine on admission was 3.5.  Likely in the setting of sepsis.  Renal function has subsequently normalized with IV fluids.  Hyponatremia: Start IV fluids.    Estimated body mass index is 23.68 kg/m as  calculated from the following:   Height as of this encounter:  (1.778 m).   Weight as of this encounter: 74.8 kg.   DVT prophylaxis: Lovenox Code Status: Full code Family Communication: Care discussed with patient Disposition Plan: Remain in the hospital for feeder IND, IV antibiotics.  Consultants:   ID  Neurosurgery   Procedures:  Left knee aspiration 5/15   Antimicrobials:  Vancomycin   Subjective: Patient is complaining of right shoulder pain.  He reported that left elbow pain has resolved.  Right knee pain improving.  He still complaining of back pain 7 out of 10.  He still experiencing weakness of right lower extremity.  Objective: Vitals:   06/28/18 1959 06/28/18 2326 06/29/18 0310 06/29/18 0803  BP: 118/81 121/79 120/70 (!) 162/77  Pulse: (!) 117 (!) 106 (!) 101 87  Resp: Temp: (!) 97.5 F (36.4 C) 98.2 F (36.8 C) 98 F (36.7 C) 98.4 F (36.9 C)  TempSrc: Oral Oral Oral Oral  SpO2: 98% 99% 99% 98%  Weight:      Height:        Intake/Output Summary (Last 24 hours) at 06/29/2018 1128 Last data filed at 06/29/2018 1610 Gross per 24 hour  Intake 1951.77 ml  Output 2650 ml  Net -698.23 ml   Filed Weights   06/19/18 1303 06/23/18 1708 06/28/18 1543  Weight: 74.8 kg 74.8 kg 74.8 kg    Examination:  General exam: Not acute distress Respiratory system: Clear to auscultation Cardiovascular system: S1-S2 regular rhythm and rate Gastrointestinal system: Bowel  sounds present soft nontender nondistended Central nervous system: And oriented, right lower extremity 3 out of 5 Extremities: No edema Skin: Right knee with multiple incisions healing, right shoulder well with multiple drain in place with bloody fluid   Data Reviewed: I have personally reviewed following labs and imaging studies  CBC: Recent Labs  Lab 06/25/18 0400 06/26/18 0530 06/27/18 0742 06/28/18 0317 06/29/18 0500  WBC 34.5* 23.4* 19.5* 17.6* 17.3*  HGB 9.5*  9.3* 8.8* 8.4* 8.0*  HCT 28.8* 27.8* 26.6* 25.2* 24.0*  MCV 92.0 92.1 92.0 91.3 92.3  PLT 383 437* 486* 499* 488*   Basic Metabolic Panel: Recent Labs  Lab 06/25/18 0400 06/26/18 0530 06/27/18 0742 06/28/18 0317 06/29/18 0500  NA 130* 128* 131* 129* 129*  K 4.4 4.4 4.3 4.3 4.7  CL 101 103 97* 97* 96*  CO2 GLUCOSE 90 108* 104* 101* 148*  BUN 33* 29* 24* 23* 26*  CREATININE 1.00 0.91 0.91 0.88 0.93  CALCIUM 7.7* 7.9* 8.3* 8.1* 8.4*   GFR: Estimated Creatinine Clearance: 94.8 mL/min (by C-G formula based on SCr of 0.93 mg/dL). Liver Function Tests: Recent Labs  Lab 06/24/18 0427  AST 24  ALT 27  ALKPHOS 94  BILITOT 1.1  PROT 5.8*  ALBUMIN 1.1*   No results for input(s): LIPASE, AMYLASE in the last 168 hours. No results for input(s): AMMONIA in the last 168 hours. Coagulation Profile: No results for input(s): INR, PROTIME in the last 168 hours. Cardiac Enzymes: No results for input(s): CKTOTAL, CKMB, CKMBINDEX, TROPONINI in the last 168 hours. BNP (last 3 results) No results for input(s): PROBNP in the last 8760 hours. HbA1C: No results for input(s): HGBA1C in the last 72 hours. CBG: No results for input(s): GLUCAP in the last 168 hours. Lipid Profile: No results for input(s): CHOL, HDL, LDLCALC, TRIG, CHOLHDL, LDLDIRECT in the last 72 hours. Thyroid Function Tests: No results for input(s): TSH, T4TOTAL, FREET4, T3FREE, THYROIDAB in the last 72 hours. Anemia Panel: No results for input(s): VITAMINB12, FOLATE, FERRITIN, TIBC, IRON, RETICCTPCT in the last 72 hours. Sepsis Labs: No results for input(s): PROCALCITON, LATICACIDVEN in the last 168 hours.  Recent Results (from the past 240 hour(s))  SARS Coronavirus 2 [facility] order, Performed in [facility] hospital lab)     Status: None   Collection Time: 06/19/18  2:12 PM  Result Value Ref Range Status   SARS Coronavirus 2 NEGATIVE NEGATIVE Final    Comment: (NOTE) If result is NEGATIVE  SARS-CoV-2 target nucleic acids are NOT DETECTED. The SARS-CoV-2 RNA is generally detectable in upper and lower  respiratory specimens during the acute phase of infection. The lowest  concentration of SARS-CoV-2 viral copies this assay can detect is 250  copies / mL. A negative result does not preclude SARS-CoV-2 infection  and should not be used as the sole basis for treatment or other  patient management decisions.  A negative result may occur with  improper specimen collection / handling, submission of specimen other  than nasopharyngeal swab, presence of viral mutation(s) within the  areas targeted by this assay, and inadequate number of viral copies  (<250 copies / mL). A negative result must be combined with clinical  observations, patient history, and epidemiological information. If result is POSITIVE SARS-CoV-2 target nucleic acids are DETECTED. The SARS-CoV-2 RNA is generally detectable in upper and lower  respiratory specimens dur ing the acute phase of infection.  Positive  results are indicative of active infection with  SARS-CoV-2.  Clinical  correlation with patient history and other diagnostic information is  necessary to determine patient infection status.  Positive results do  not rule out bacterial infection or co-infection with other viruses. If result is PRESUMPTIVE POSTIVE SARS-CoV-2 nucleic acids MAY BE PRESENT.   A presumptive positive result was obtained on the submitted specimen  and confirmed on repeat testing.  While 2019 novel coronavirus  (SARS-CoV-2) nucleic acids may be present in the submitted sample  additional confirmatory testing may be necessary for epidemiological  and / or clinical management purposes  to differentiate between  SARS-CoV-2 and other Sarbecovirus currently known to infect humans.  If clinically indicated additional testing with an alternate test  methodology [phone]) is advised. The SARS-CoV-2 RNA is generally  detectable in upper  and lower respiratory sp ecimens during the acute  phase of infection. The expected result is Negative. Fact Sheet for Patients:  BoilerBrush.com.cy Fact Sheet for Healthcare Providers: https://pope.com/ This test is not yet approved or cleared by the [place] FDA and has been authorized for detection and/or diagnosis of SARS-CoV-2 by FDA under an Emergency Use Authorization (EUA).  This EUA will remain in effect (meaning this test can be used) for the duration of the COVID-19 declaration under Section 564(b)(1) of the Act, 21 U.S.C. section 360bbb-3(b)(1), unless the authorization is terminated or revoked sooner. Performed at [facility], [address], [place], [place] [address]   Culture, blood (routine x 2)     Status: None   Collection Time: 06/20/18  9:00 AM  Result Value Ref Range Status   Specimen Description BLOOD RIGHT HAND  Final   Special Requests   Final    BOTTLES DRAWN AEROBIC ONLY Blood Culture results may not be optimal due to an inadequate volume of blood received in culture bottles   Culture   Final    NO GROWTH 5 DAYS Performed at [facility], [address], [place], [place] [address]    Report Status 06/25/2018 FINAL  Final  Culture, blood (routine x 2)     Status: None   Collection Time: 06/20/18  9:10 AM  Result Value Ref Range Status   Specimen Description BLOOD LEFT HAND  Final   Special Requests   Final    BOTTLES DRAWN AEROBIC ONLY Blood Culture adequate volume   Culture   Final    NO GROWTH 5 DAYS Performed at [facility], [address], [place], [place] [address]    Report Status 06/25/2018 FINAL  Final  Body fluid culture     Status: None   Collection Time: 06/22/18  1:05 PM  Result Value Ref Range Status   Specimen Description SYNOVIAL  Final   Special Requests KNEE  Final   Gram Stain   Final    MODERATE WBC PRESENT, PREDOMINANTLY PMN RARE GRAM POSITIVE COCCI IN PAIRS AND  CHAINS    Culture   Final    NO GROWTH 4 DAYS Performed at [facility], [address], [place], [place] [address]    Report Status 06/26/2018 FINAL  Final  Surgical pcr screen     Status: Abnormal   Collection Time: 06/23/18  4:40 PM  Result Value Ref Range Status   MRSA, PCR NEGATIVE NEGATIVE Final   Staphylococcus aureus POSITIVE (A) NEGATIVE Final    Comment: (NOTE) The Xpert SA Assay (FDA approved for NASAL specimens in patients 54 years of age and older), is one component of a comprehensive  surveillance program. It is not intended to diagnose infection nor to guide or monitor treatment. Performed at [facility], [address] [place], [place] [address]   Aerobic/Anaerobic Culture (surgical/deep wound)     Status: None   Collection Time: 06/23/18  6:53 PM  Result Value Ref Range Status   Specimen Description SYNOVIAL LEFT KNEE  Final   Special Requests SWABS  Final   Gram Stain   Final    ABUNDANT WBC PRESENT, PREDOMINANTLY PMN RARE GRAM POSITIVE COCCI    Culture   Final    No growth aerobically or anaerobically. Performed at [facility], [address] [place], [place] [address]    Report Status 06/28/2018 FINAL  Final  Aerobic/Anaerobic Culture (surgical/deep wound)     Status: None (Preliminary result)   Collection Time: 06/28/18  5:27 PM  Result Value Ref Range Status   Specimen Description ABSCESS SHOULDER POSTERIOR  Final   Special Requests PATIENT ON FOLLOWING VANC  Final   Gram Stain   Final    FEW WBC PRESENT, PREDOMINANTLY PMN FEW GRAM POSITIVE COCCI Performed at [facility], [address] [place], [place] [address]    Culture PENDING  Incomplete   Report Status PENDING  Incomplete  Aerobic/Anaerobic Culture (surgical/deep wound)     Status: None (Preliminary result)   Collection Time: 06/28/18  6:06 PM  Result Value Ref Range Status   Specimen Description JOINT FLUID AC  Final   Special Requests PATIENT ON FOLLOWING  VANC FLUID ON SWABS  Final   Gram Stain   Final    FEW WBC PRESENT, PREDOMINANTLY PMN NO ORGANISMS SEEN Performed at [facility], [address] [place], [place] [address]    Culture PENDING  Incomplete   Report Status PENDING  Incomplete         Radiology Studies: Mr Lumbar Spine Wo Contrast  Result Date: 06/27/2018 CLINICAL DATA:  53 y/o M; history of substance abuse with several weeks of pain and multiple abscesses. EXAM: MRI LUMBAR SPINE WITHOUT CONTRAST TECHNIQUE: Multiplanar, multisequence MR imaging of the lumbar spine was performed. No intravenous contrast was administered. COMPARISON:  06/13/2018 lumbar spine MRI. FINDINGS: Segmentation:  Standard. Alignment: Stable L5-S1 grade 1 anterolisthesis secondary to chronic bilateral L5 pars defects. Vertebrae: There is increased disc signal and opposing endplate signal the L5-S1 level without vertebral body collapse. There is edema within the right L4-5 facets and joint effusion. There is a joint effusion within the right sacroiliac joint and edema in the opposing iliac bone and sacral ala. There are multiloculated fluid collections within the right-sided paraspinal muscles from L2-S1, retroperitoneal space posterior to the posterior renal fascia,, psoas muscle, iliacus muscle, right lateral pelvic floor muscles, piriformis muscle, and the right-sided gluteus muscles extending below the field of view. Conus medullaris and cauda equina: Conus extends to the L1 level. Conus and cauda equina appear normal. Paraspinal and other soft tissues: As above. Disc levels: L1-2: No significant disc displacement, foraminal stenosis, or canal stenosis. L2-3: No significant disc displacement, foraminal stenosis, or canal stenosis. L3-4: Stable mild disc bulge. No significant foraminal or spinal canal stenosis. L4-5: Stable mild disc bulge and facet hypertrophy. Interval mild distention of the right facet synovial capsule narrowing the right lateral recess  and contacting the descending right L5 nerve root. No significant foraminal or spinal canal stenosis. L5-S1: Grade 1 anterolisthesis, bilateral pars defects, and facet hypertrophy are stable resulting in moderate to severe bilateral foraminal  stenosis. No significant spinal canal stenosis. IMPRESSION: 1. Joint fluid and bone marrow edema compatible with L5-S1 discitis osteomyelitis, right L4-5 septic arthritis, and right sacroiliac joint septic arthritis. No bony destructive changes at this time. 2. Multiloculated fluid collections compatible with abscess within the right-sided paraspinal muscles from L2-S1, right retroperitoneal space posterior to the posterior renal fascia, right psoas muscle, right iliacus muscle, right lateral pelvic floor muscles, right piriformis muscle, and the right-sided gluteus muscles extending below the field of view. 3. Expansion the right L4-5 facet capsule from joint fluid effaces the right lateral recess and contacts the descending right L5 nerve root. 4. Otherwise stable lumbar spondylosis greatest at L5-S1 where grade 1 anterolisthesis contributes to moderate to severe bilateral foraminal stenosis. Electronically Signed   By: Mitzi Hansen M.D.   On: 06/27/2018 19:24        Scheduled Meds: . Chlorhexidine Gluconate Cloth  6 each Topical Daily  . diphenhydrAMINE  25 mg Oral Once  . docusate sodium  100 mg Oral BID  . enoxaparin (LOVENOX) injection  40 mg Subcutaneous Daily  . esomeprazole  20 mg Oral QAC breakfast  . folic acid  1 mg Oral Daily  . methocarbamol  500 mg Oral TID  . multivitamin with minerals  1 tablet Oral Daily  . mupirocin ointment  1 application Nasal BID  . sodium chloride flush  10-40 mL Intracatheter Q12H  . sucralfate  1 g Oral TID WC & HS  . thiamine  100 mg Oral Daily   Or  . thiamine  100 mg Intravenous Daily  . traMADol  50 mg Oral Q6H   Continuous Infusions: . sodium chloride 75 mL/hr at 06/28/18 1909  . lactated  ringers 10 mL/hr at 06/28/18 1546  . methocarbamol (ROBAXIN) IV    . vancomycin Stopped (06/29/18 0154)     LOS: 9 days    Time spent: 35 minutes.     Alba Cory, MD [facility] Pager [phone]  If 7PM-7AM, please contact night-coverage [email or web address] Password [facility] 06/29/2018, 11:28 AM

## 2018-06-29 NOTE — Progress Notes (Signed)
[facility]  Date of Admission:  06/19/2018     Antibiotic Days: 10  Day 7 vancomycin      ASSESSMENT: [name] is now POD1 following R shoulder debridement of multiple abscesses and [facility]. Operative samples indicating GPCs as well on gram stain. Presumably this is disseminated staph aureus infection. Continue vancomycin at minimum 6 weeks.   Discharge disposition unclear, I suspect that he will need a rehab stay considering his multiple joints involved and abscesses along back/RLE musculature. Likely would need repeated MRI nearing the end of therapy of his spine to reassess for total duration of antibiotics.   Creatinine remains stable < 1. WBC improving.    PLAN: 1. Continue vancomycin  2. Follow micro data    Principal Problem:   Septic arthritis of multiple joints (HCC) Active Problems:   Septic arthritis of knee, left (HCC)   Effusion of left elbow   Discitis of lumbosacral region   Psoas abscess, right (HCC)   Multiple abscesses of right shoulder   Hepatitis C antibody test positive   Polysubstance abuse (HCC)   Cigarette smoker   Intractable back pain   AKI (acute kidney injury) (HCC)   Spinal stenosis of lumbosacral region   GERD (gastroesophageal reflux disease)   Mild protein-calorie malnutrition (HCC)   Acute medial meniscal tear, left, initial encounter   Normocytic anemia   Recurrent boils   Chronic infection of left knee (HCC)   Scheduled Meds: . Chlorhexidine Gluconate Cloth  6 each Topical Daily  . diphenhydrAMINE  25 mg Oral Once  . docusate sodium  100 mg Oral BID  . enoxaparin (LOVENOX) injection  40 mg Subcutaneous Daily  . esomeprazole  20 mg Oral QAC breakfast  . folic acid  1 mg Oral Daily  . methocarbamol  500 mg Oral TID  . multivitamin with minerals  1 tablet Oral Daily  . mupirocin ointment  1 application Nasal BID  . sodium chloride flush  10-40 mL Intracatheter Q12H  .  sucralfate  1 g Oral TID WC & HS  . thiamine  100 mg Oral Daily   Or  . thiamine  100 mg Intravenous Daily  . traMADol  50 mg Oral Q6H   Continuous Infusions: . sodium chloride 75 mL/hr at 06/28/18 1909  . lactated ringers 10 mL/hr at 06/28/18 1546  . methocarbamol (ROBAXIN) IV    . vancomycin Stopped (06/29/18 0154)   PRN Meds:.acetaminophen **OR** acetaminophen, alum & mag hydroxide-simeth, menthol-cetylpyridinium **OR** phenol, methocarbamol **OR** methocarbamol (ROBAXIN) IV, metoCLOPramide **OR** metoCLOPramide (REGLAN) injection, morphine injection, ondansetron **OR** ondansetron (ZOFRAN) IV, polyethylene glycol, sodium chloride flush   SUBJECTIVE: He is doing well this morning considering his 8/10 pain in shoulder. He is still unable to lift the right leg. Left knee ROM improving. Tolerating vancomycin well.   Interim History - GPCs on stain from shoulder noted in 1/2 surgical specimens.    Review of Systems: Review of Systems  Constitutional: Negative for chills, diaphoresis, fever, malaise/fatigue and weight loss.  Respiratory: Negative for cough, sputum production and shortness of breath.   Cardiovascular: Negative for chest pain.  Gastrointestinal: Negative for abdominal pain, diarrhea, heartburn, nausea and vomiting.  Musculoskeletal: Positive for back pain and joint pain.  Skin: Negative for itching and rash.  Neurological: Positive for focal weakness (R leg ) and weakness.  Psychiatric/Behavioral: Positive for substance abuse (cocaine use).    No Known Allergies  OBJECTIVE:  Vitals:   06/28/18 1929 06/28/18 1959 06/28/18 2326 06/29/18 0310  BP: 115/75 118/81 121/79 120/70  Pulse: (!) 118 (!) 117 (!) 106 (!) 101  Resp: 10 18 20 18   Temp:  (!) 97.5 F (36.4 C) 98.2 F (36.8 C) 98 F (36.7 C)  TempSrc:  Oral Oral Oral  SpO2: 99% 98% 99% 99%  Weight:      Height:       Body mass index is 23.68 kg/m.  Physical Exam Constitutional:      Comments: Resting  in bed. Smiling and pleasant.   Cardiovascular:     Rate and Rhythm: Normal rate and regular rhythm.     Heart sounds: No murmur.  Pulmonary:     Effort: Pulmonary effort is normal.     Breath sounds: Normal breath sounds.  Abdominal:     Palpations: Abdomen is soft.     Tenderness: There is no abdominal tenderness.  Musculoskeletal:     Comments: Unable to raise his right leg off the bed to gravity. Left knee incisions healing and looks of normal color/size/temperature. L elbow/shoulder moving freely with normal ROM.  R shoulder in sling.   Skin:    Findings: No rash.     Comments: R shoulder with aquacell surgical dressings in place. Small bloody drainage. #3 drains remain in place with sanguinous drainage noted.   Psychiatric:        Mood and Affect: Mood normal.   LUE PICC line - clean/dry dressing. Insertion site w/o erythema, tenderness, drainage, cording or distal swelling of affected extremity    Lab Results Lab Results  Component Value Date   WBC 17.3 (H) 06/29/2018   HGB 8.0 (L) 06/29/2018   HCT 24.0 (L) 06/29/2018   MCV 92.3 06/29/2018   PLT 488 (H) 06/29/2018    Lab Results  Component Value Date   CREATININE 0.93 06/29/2018   BUN 26 (H) 06/29/2018   NA 129 (L) 06/29/2018   K 4.7 06/29/2018   CL 96 (L) 06/29/2018   CO2 25 06/29/2018    Lab Results  Component Value Date   ALT 27 06/24/2018   AST 24 06/24/2018   ALKPHOS 94 06/24/2018   BILITOT 1.1 06/24/2018     Microbiology: Recent Results (from the past 240 hour(s))  SARS Coronavirus 2 [facility] order, Performed in [facility] hospital lab)     Status: None   Collection Time: 06/19/18  2:12 PM  Result Value Ref Range Status   SARS Coronavirus 2 NEGATIVE NEGATIVE Final    Comment: (NOTE) If result is NEGATIVE SARS-CoV-2 target nucleic acids are NOT DETECTED. The SARS-CoV-2 RNA is generally detectable in upper and lower  respiratory specimens during the acute phase of infection. The lowest   concentration of SARS-CoV-2 viral copies this assay can detect is 250  copies / mL. A negative result does not preclude SARS-CoV-2 infection  and should not be used as the sole basis for treatment or other  patient management decisions.  A negative result may occur with  improper specimen collection / handling, submission of specimen other  than nasopharyngeal swab, presence of viral mutation(s) within the  areas targeted by this assay, and inadequate number of viral copies  (<250 copies / mL). A negative result must be combined with clinical  observations, patient history, and epidemiological information. If result is POSITIVE SARS-CoV-2 target nucleic acids are DETECTED. The SARS-CoV-2 RNA is generally detectable in upper and lower  respiratory specimens dur ing the acute phase  of infection.  Positive  results are indicative of active infection with SARS-CoV-2.  Clinical  correlation with patient history and other diagnostic information is  necessary to determine patient infection status.  Positive results do  not rule out bacterial infection or co-infection with other viruses. If result is PRESUMPTIVE POSTIVE SARS-CoV-2 nucleic acids MAY BE PRESENT.   A presumptive positive result was obtained on the submitted specimen  and confirmed on repeat testing.  While 2019 novel coronavirus  (SARS-CoV-2) nucleic acids may be present in the submitted sample  additional confirmatory testing may be necessary for epidemiological  and / or clinical management purposes  to differentiate between  SARS-CoV-2 and other Sarbecovirus currently known to infect humans.  If clinically indicated additional testing with an alternate test  methodology [phone]) is advised. The SARS-CoV-2 RNA is generally  detectable in upper and lower respiratory sp ecimens during the acute  phase of infection. The expected result is Negative. Fact Sheet for Patients:  BoilerBrush.com.cy Fact Sheet  for Healthcare Providers: https://pope.com/ This test is not yet approved or cleared by the [place] FDA and has been authorized for detection and/or diagnosis of SARS-CoV-2 by FDA under an Emergency Use Authorization (EUA).  This EUA will remain in effect (meaning this test can be used) for the duration of the COVID-19 declaration under Section 564(b)(1) of the Act, 21 U.S.C. section 360bbb-3(b)(1), unless the authorization is terminated or revoked sooner. Performed at [facility], [address] [place], [place] [address]   Culture, blood (routine x 2)     Status: None   Collection Time: 06/20/18  9:00 AM  Result Value Ref Range Status   Specimen Description BLOOD RIGHT HAND  Final   Special Requests   Final    BOTTLES DRAWN AEROBIC ONLY Blood Culture results may not be optimal due to an inadequate volume of blood received in culture bottles   Culture   Final    NO GROWTH 5 DAYS Performed at [facility], [address], [place], [place] [address]    Report Status 06/25/2018 FINAL  Final  Culture, blood (routine x 2)     Status: None   Collection Time: 06/20/18  9:10 AM  Result Value Ref Range Status   Specimen Description BLOOD LEFT HAND  Final   Special Requests   Final    BOTTLES DRAWN AEROBIC ONLY Blood Culture adequate volume   Culture   Final    NO GROWTH 5 DAYS Performed at [facility], [address] [place], [place] [address]    Report Status 06/25/2018 FINAL  Final  Body fluid culture     Status: None   Collection Time: 06/22/18  1:05 PM  Result Value Ref Range Status   Specimen Description SYNOVIAL  Final   Special Requests KNEE  Final   Gram Stain   Final    MODERATE WBC PRESENT, PREDOMINANTLY PMN RARE GRAM POSITIVE COCCI IN PAIRS AND CHAINS    Culture   Final    NO GROWTH 4 DAYS Performed at [facility], [address], [place], [place] [address]    Report Status 06/26/2018 FINAL  Final  Surgical pcr  screen     Status: Abnormal   Collection Time: 06/23/18  4:40 PM  Result Value Ref Range Status   MRSA, PCR NEGATIVE NEGATIVE Final   Staphylococcus aureus POSITIVE (A) NEGATIVE Final    Comment: (NOTE) The Xpert SA Assay (FDA approved for NASAL specimens in patients  61 years of age and older), is one component of a comprehensive surveillance program. It is not intended to diagnose infection nor to guide or monitor treatment. Performed at [facility], [address] [place], [place] [address]   Aerobic/Anaerobic Culture (surgical/deep wound)     Status: None   Collection Time: 06/23/18  6:53 PM  Result Value Ref Range Status   Specimen Description SYNOVIAL LEFT KNEE  Final   Special Requests SWABS  Final   Gram Stain   Final    ABUNDANT WBC PRESENT, PREDOMINANTLY PMN RARE GRAM POSITIVE COCCI    Culture   Final    No growth aerobically or anaerobically. Performed at [facility], [address] [place], [place] [address]    Report Status 06/28/2018 FINAL  Final  Aerobic/Anaerobic Culture (surgical/deep wound)     Status: None (Preliminary result)   Collection Time: 06/28/18  5:27 PM  Result Value Ref Range Status   Specimen Description ABSCESS SHOULDER POSTERIOR  Final   Special Requests PATIENT ON FOLLOWING VANC  Final   Gram Stain   Final    FEW WBC PRESENT, PREDOMINANTLY PMN FEW GRAM POSITIVE COCCI Performed at [facility], [address], [place], [place] [address]    Culture PENDING  Incomplete   Report Status PENDING  Incomplete  Aerobic/Anaerobic Culture (surgical/deep wound)     Status: None (Preliminary result)   Collection Time: 06/28/18  6:06 PM  Result Value Ref Range Status   Specimen Description JOINT FLUID AC  Final   Special Requests PATIENT ON FOLLOWING VANC FLUID ON SWABS  Final   Gram Stain   Final    FEW WBC PRESENT, PREDOMINANTLY PMN NO ORGANISMS SEEN Performed at [facility], [address] [place], [place] [address]     Culture PENDING  Incomplete   Report Status PENDING  Incomplete     Rexene Alberts, MSN, NP-C [facility]  [email or web address] Pager: [phone] Office: [phone] [facility] Main Line: [phone]

## 2018-06-29 NOTE — Progress Notes (Signed)
Pharmacy Antibiotic Note  [name] is a 53 y.o. male admitted on 06/19/2018 with back pain, now with septic arthritis of multiple joints. Pharmacy has been consulted for vancomycin dosing. Patient is s/p I&D of R shoulder and the [facility] Services joint on 5/20.  Patient Scr up from 0.88 to 0.93, WBC down to 17.3, and patient remains afebrile. AUC 573 on vancomycin 1000 mg IV q12h, will decrease dose since patient is most likely accumulating vancomycin. ID recommends longer course of antibiotic, at least 6 weeks of vancomycin.   Plan: Decrease Vancomycin to 750 mg IV Q12hr (expected AUC 430, Cmax ss 22.3, Cmin ss 14.1) Monitor renal fxn, clinical progress, vancomycin peak and trough once at new steady state  Height: 5\' 10"  (177.8 cm) Weight: 165 lb (74.8 kg) IBW/kg (Calculated) : 73  Temp (24hrs), Avg:97.9 F (36.6 C), Min:97.3 F (36.3 C), Max:98.4 F (36.9 C)  Recent Labs  Lab 06/25/18 0400 06/26/18 0530 06/27/18 0742 06/28/18 0317 06/28/18 1255 06/29/18 0500 06/29/18 0700 06/29/18 1237  WBC 34.5* 23.4* 19.5* 17.6*  --  17.3*  --   --   CREATININE 1.00 0.91 0.91 0.88  --  0.93  --   --   VANCOTROUGH  --   --   --   --  23*  --   --  19  VANCOPEAK  --   --   --   --   --   --  24*  --     Estimated Creatinine Clearance: 94.8 mL/min (by C-G formula based on SCr of 0.93 mg/dL).    No Known Allergies   Vanc 5/15 >> CTX 5/12 >> 5/16  5/11 covid - negative 5/12 BCx - negative 5/14 synovial fluid L knee - negative 5/15 synovial fluid L knee - GPC on stain 5/20 BCx: ng x 1 day 5/20 abscess shoulder posterior - few GPC on stain 5/20 AC joint fluid:   Thank you for allowing pharmacy to be a part of this patient's care.  Lenord Carbo, PharmD PGY1 Pharmacy Resident Phone: [phone]  Please check AMION for all [facility] Pharmacy phone numbers 06/29/2018, 1:44 PM

## 2018-06-29 NOTE — Progress Notes (Signed)
Occupational Therapy Re-evaluation Patient Details Name: [name] MRN: [id number] DOB: May 11, 1965 Today's Date: 06/29/2018    History of Present Illness Pt. with PMH of hep C, substance abuse; admitted on 06/19/2018, presented with complaint of back pain, was found to have musculoskeletal back pain as well as right upper extremity cellulitis; Patient had a debridement of his shoulder perfromed on 5/20.    Clinical Impression   Pt presents now s/p I&D R shoulder, bed level mobility since admission 10 days ago. Pt completed bed mobility with +2 assist, some extra time; support provided to LLE especially once off EOB and pad utilized to pivot hips and powerup trunk.  Pt initially fearful of bed mobility and expressed anxiety about increased pain with movement and touch but agreeable to work with therapy and thanked OT/PT at end of session. Pt sat EOB at min guard level close to 15 minutes during session, noted to lean onto left side to alleviate back and R hip discomfort.     Follow Up Recommendations  CIR;Supervision/Assistance - 24 hour    Equipment Recommendations  Other (comment)(to be determined)    Recommendations for Other Services Rehab consult     Precautions / Restrictions Precautions Precautions: Fall;Shoulder Type of Shoulder Precautions: see OT order for AROM limits, sling for sleep and comfort Shoulder Interventions: Shoulder sling/immobilizer;For comfort(and sleep) Required Braces or Orthoses: Sling Restrictions Weight Bearing Restrictions: Yes RUE Weight Bearing: Non weight bearing      Mobility Bed Mobility Overal bed mobility: Needs Assistance Bed Mobility: Supine to Sit;Sit to Supine     Supine to sit: Mod assist;+2 for physical assistance;+2 for safety/equipment Sit to supine: Max assist;+2 for physical assistance;HOB elevated   General bed mobility comments: Mod a to control the leg and to sit up. Once sitting the patient required min gaurd while sitting  on the edge of the bed. He leaned on his left elbow to take stress off his right lower back. he was able to sit up for 15 minutes.   Transfers                 General transfer comment: Patient unable to transfer 2nd to difficulty sitting up straight     Balance Overall balance assessment: Needs assistance Sitting-balance support: Single extremity supported Sitting balance-Leahy Scale: Poor Sitting balance - Comments: leans on left elbow in sitting                                    ADL either performed or assessed with clinical judgement   ADL Overall ADL's : Needs assistance/impaired Eating/Feeding: Set up;Bed level   Grooming: Bed level;Maximal assistance;Sitting   Upper Body Bathing: Bed level;Maximal assistance   Lower Body Bathing: Total assistance;Bed level   Upper Body Dressing : Maximal assistance;Bed level;Sitting   Lower Body Dressing: Total assistance;Bed level       Toileting- Clothing Manipulation and Hygiene: Total assistance;Bed level         General ADL Comments: Pt completed bed mobility and sat EOB close to 15 minutes. Pt in left lateral lean position using LUE and rails to alleviate R hip and back discomfort.      Vision         Perception     Praxis      Pertinent Vitals/Pain Pain Assessment: Faces Faces Pain Scale: Hurts even more Pain Location: Low Back; left knee; right shoulder  Pain Descriptors /  Indicators: Aching;Discomfort;Pressure Pain Intervention(s): Premedicated before session;Limited activity within patient's tolerance;Monitored during session;Repositioned     Hand Dominance Right   Extremity/Trunk Assessment Upper Extremity Assessment Upper Extremity Assessment: Generalized weakness;RUE deficits/detail;LUE deficits/detail RUE Deficits / Details: s/p I&D R shoulder, did not formally assess ROM this session RUE: Unable to fully assess due to immobilization;Unable to fully assess due to pain LUE Deficits  / Details: able to complete AROM against gravity. Pt reports improvement in LUE movements since admission.   Lower Extremity Assessment Lower Extremity Assessment: Defer to PT evaluation       Communication Communication Communication: No difficulties   Cognition Arousal/Alertness: Awake/alert Behavior During Therapy: WFL for tasks assessed/performed;Anxious Overall Cognitive Status: Within Functional Limits for tasks assessed                                 General Comments: some anxiety about moving but decreased ansxiety as he moved more   General Comments  sling on the right arm.     Exercises Exercises: General Upper Extremity;General Lower Extremity General Exercises - Lower Extremity Ankle Circles/Pumps: (Reviewed ankle pumps quad sets, and glut sets )   Shoulder Instructions      Home Living Family/patient expects to be discharged to:: Private residence Living Arrangements: Parent Available Help at Discharge: Available PRN/intermittently Type of Home: House Home Access: Level entry     Home Layout: One level     Bathroom Shower/Tub: Producer, television/film/video: Handicapped height     Home Equipment: Cane - single point          Prior Functioning/Environment Level of Independence: Independent        Comments: 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        OT Problem List: Decreased strength;Decreased range of motion;Decreased activity tolerance;Impaired balance (sitting and/or standing);Decreased safety awareness;Decreased coordination;Impaired UE functional use;Pain;Increased edema;Decreased knowledge of precautions;Decreased knowledge of use of DME or AE      OT Treatment/Interventions: Self-care/ADL training;Therapeutic exercise;Neuromuscular education;Energy conservation;DME and/or AE instruction;Therapeutic activities;Patient/family education;Balance training    OT Goals(Current goals can be found  in the care plan section) Acute Rehab OT Goals Patient Stated Goal: return home OT Goal Formulation: With patient Time For Goal Achievement: 07/05/18 Potential to Achieve Goals: Good  OT Frequency: Min 3X/week   Barriers to D/C: Decreased caregiver support          Co-evaluation PT/OT/SLP Co-Evaluation/Treatment: Yes Reason for Co-Treatment: Complexity of the patient's impairments (multi-system involvement);For patient/therapist safety;To address functional/ADL transfers PT goals addressed during session: Mobility/safety with mobility;Balance;Strengthening/ROM OT goals addressed during session: ADL's and self-care;Strengthening/ROM      AM-PAC OT "6 Clicks" Daily Activity     Outcome Measure Help from another person eating meals?: A Little Help from another person taking care of personal grooming?: A Lot Help from another person toileting, which includes using toliet, bedpan, or urinal?: Total Help from another person bathing (including washing, rinsing, drying)?: Total Help from another person to put on and taking off regular upper body clothing?: Total Help from another person to put on and taking off regular lower body clothing?: Total 6 Click Score: 9   End of Session Equipment Utilized During Treatment: Other (comment)(RUE sling) Nurse Communication: Mobility status;Precautions  Activity Tolerance: Patient limited by pain Patient left: in bed;with call bell/phone within reach;with bed alarm set;with SCD's reapplied  OT Visit Diagnosis: Muscle weakness (generalized) (M62.81);Pain  Time: [id number] OT Time Calculation (min): 33 min Charges:  OT General Charges $OT Visit: 1 Visit OT Evaluation $OT Re-eval: 1 Re-eval  Raynald KempKathryn Terrionna Bridwell, OT Acute Rehabilitation Services Pager: [phone] Office: [phone]   Pilar GrammesMathews, Arely Tinner H 06/29/2018, 12:43 PM

## 2018-06-29 NOTE — Progress Notes (Signed)
  Subjective: Patient stable.  He was out of bed today and was able to bend his left knee better.  Superior AC joint drain removed today.   Objective: Vital signs in last 24 hours: Temp:  [97.3 F (36.3 C)-98.4 F (36.9 C)] 97.6 F (36.4 C) (05/21 1519) Pulse Rate:  [87-123] 98 (05/21 1519) Resp:  [10-20] 20 (05/21 1519) BP: (111-162)/(64-81) 146/77 (05/21 1519) SpO2:  [93 %-100 %] 98 % (05/21 1519)  Intake/Output from previous day: 05/20 0701 - 05/21 0700 In: 1951.8 [I.V.:1335.5; IV Piggyback:616.3] Out: 2650 [Urine:2550; Drains:100] Intake/Output this shift: Total I/O In: 1999.1 [P.O.:342; I.V.:1507.1; IV Piggyback:150] Out: 135 [Drains:135]  Exam:  No cellulitis present  Labs: Recent Labs    06/27/18 0742 06/28/18 0317 06/29/18 0500  HGB 8.8* 8.4* 8.0*   Recent Labs    06/28/18 0317 06/29/18 0500  WBC 17.6* 17.3*  RBC 2.76* 2.60*  HCT 25.2* 24.0*  PLT 499* 488*   Recent Labs    06/28/18 0317 06/29/18 0500  NA 129* 129*  K 4.3 4.7  CL 97* 96*  CO2 26 25  BUN 23* 26*  CREATININE 0.88 0.93  GLUCOSE 101* 148*  CALCIUM 8.1* 8.4*   No results for input(s): LABPT, INR in the last 72 hours.  Assessment/Plan: Impression is patient is now a day out from right shoulder I&D.  Both JP drains remain.  Plan to have those discontinued tomorrow.  Needs to start working on shoulder range of motion with occupational therapy.  Weightbearing as tolerated on the left leg.  Continue with IV antibiotics.   Marrianne Mood Dean 06/29/2018, 5:10 PM

## 2018-06-29 NOTE — Progress Notes (Signed)
PICC site assessment, brisk blood return after NS flush.

## 2018-06-29 NOTE — Progress Notes (Signed)
Physical Therapy Treatment Patient Details Name: [name] MRN: [id number] DOB: [birth year] Today's Date: 06/29/2018    History of Present Illness Pt. with PMH of hep C, substance abuse; admitted on 06/19/2018, presented with complaint of back pain, was found to have musculoskeletal back pain as well as right upper extremity cellulitis; Patient had a debridement of his shoulder perfromed on 5/20.     PT Comments    Patient was initally hesitant to mobilize with therapy 2nd to fear of pain. He was able to transfer to the edge of the bed with mod a and sat for 15 minutes. He leaned hevily on his left arm to take weight off the left side of his back. He required max a to transfer back to bed. The patient is highly motivated and had a high prior level of function. If he can increase his tolerance to activity he would be a great candadate for CIR. Acute therapy will continue to work with the patient.   Follow Up Recommendations  SNF;CIR     Equipment Recommendations  Rolling walker with 5" wheels    Recommendations for Other Services Rehab consult     Precautions / Restrictions Precautions Precautions: None Type of Shoulder Precautions: see OT order for AROM limits, sling for sleep and comfort Shoulder Interventions: Shoulder sling/immobilizer;For comfort(and sleep) Required Braces or Orthoses: Sling Restrictions Weight Bearing Restrictions: Yes RUE Weight Bearing: Non weight bearing    Mobility  Bed Mobility Overal bed mobility: Needs Assistance Bed Mobility: Supine to Sit;Rolling     Supine to sit: Mod assist;+2 for physical assistance;+2 for safety/equipment Sit to supine: Max assist;+2 for physical assistance;HOB elevated   General bed mobility comments: Mod a to control the leg and to sit up. Once sitting the patient required min gaurd while sitting on the edge of the bed. He leaned on his left elbow to take stress off his right lower back. he was able to sit up for 15  minutes. Max a to get back into bed. Therapy controlled legs and back. MAx a to slide to the head of bed   Transfers                 General transfer comment: Patient unable to transfer 2nd to difficulty sitting up straight   Ambulation/Gait                 Stairs             Wheelchair Mobility    Modified Rankin (Stroke Patients Only)       Balance Overall balance assessment: Needs assistance Sitting-balance support: Single extremity supported Sitting balance-Leahy Scale: Poor Sitting balance - Comments: leans on left elbow in sitting                                     Cognition Arousal/Alertness: Awake/alert Behavior During Therapy: WFL for tasks assessed/performed Overall Cognitive Status: Within Functional Limits for tasks assessed                                 General Comments: some anxiety about moving but decreased ansxiety as he moved more      Exercises General Exercises - Lower Extremity Ankle Circles/Pumps: (Reviewed ankle pumps quad sets, and glut sets )    General Comments General comments (skin integrity, edema, etc.): slinon the right  arm       Pertinent Vitals/Pain Pain Assessment: Faces Faces Pain Scale: Hurts even more Pain Location: Low Back; left knee; right shoulder  Pain Descriptors / Indicators: Aching;Discomfort;Pressure Pain Intervention(s): Premedicated before session;Limited activity within patient's tolerance;Monitored during session;Repositioned    Home Living Family/patient expects to be discharged to:: Private residence Living Arrangements: Parent Available Help at Discharge: Available PRN/intermittently Type of Home: House Home Access: Level entry   Home Layout: One level Home Equipment: Cane - single point      Prior Function Level of Independence: Independent      Comments: 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   PT Goals (current goals  can now be found in the care plan section) Acute Rehab PT Goals Patient Stated Goal: return home PT Goal Formulation: With patient Time For Goal Achievement: 07/05/18 Potential to Achieve Goals: Good Progress towards PT goals: Progressing toward goals    Frequency    Min 3X/week      PT Plan Current plan remains appropriate    Co-evaluation PT/OT/SLP Co-Evaluation/Treatment: Yes Reason for Co-Treatment: Complexity of the patient's impairments (multi-system involvement);Necessary to address cognition/behavior during functional activity;For patient/therapist safety;To address functional/ADL transfers PT goals addressed during session: Mobility/safety with mobility;Proper use of DME;Balance;Strengthening/ROM OT goals addressed during session: ADL's and self-care;Strengthening/ROM      AM-PAC PT "6 Clicks" Mobility   Outcome Measure  Help needed turning from your back to your side while in a flat bed without using bedrails?: A Lot Help needed moving from lying on your back to sitting on the side of a flat bed without using bedrails?: A Lot Help needed moving to and from a bed to a chair (including a wheelchair)?: Total Help needed standing up from a chair using your arms (e.g., wheelchair or bedside chair)?: Total Help needed to walk in hospital room?: Total Help needed climbing 3-5 steps with a railing? : Total 6 Click Score: 8    End of Session   Activity Tolerance: Patient limited by pain Patient left: in bed;with call bell/phone within reach;with bed alarm set Nurse Communication: Mobility status;Patient requests pain meds PT Visit Diagnosis: Unsteadiness on feet (R26.81);Muscle weakness (generalized) (M62.81);Pain;Other abnormalities of gait and mobility (R26.89) Pain - Right/Left: Right Pain - part of body: Arm     Time: [id number] PT Time Calculation (min) (ACUTE ONLY): 33 min  Charges:                           Dessie Comaavid J Tej Murdaugh PT DPT  06/29/2018, 2:30  PM

## 2018-06-29 NOTE — Op Note (Signed)
[name], [name] MEDICAL RECORD [id number] ACCOUNT [id number] DATE OF BIRTH:Mar 12, 1965 FACILITY: [facility] LOCATION: [facility] PHYSICIAN:GREGORY Diamantina [place], MD  OPERATIVE REPORT  DATE OF PROCEDURE:  06/28/2018  PREOPERATIVE DIAGNOSIS:  Right shoulder infection anteriorly, posteriorly, and possibly in the acromioclavicular joint.  POSTOPERATIVE DIAGNOSIS:  Right shoulder infection anteriorly, posteriorly, and possibly in the acromioclavicular joint.  PROCEDURE: 1.  Right shoulder abscess incision and debridement of the posterior abscess. 2.  Anterior abscess. 3.  Acromioclavicular joint.  INDICATIONS:  The patient is a 53 year old patient with right shoulder pain.  He presents for operative management after explanation of risks and benefits.  CT scan showed widening of the [facility] joint consistent with possible pathology, but no fluid in the  glenohumeral joint.  He does have abscess cavities in the anterior and posterior aspect of the deltoid musculature.  PROCEDURE IN DETAIL:  The patient was brought to the operating room where general anesthetic was induced.  Preoperative antibiotics were administered.  Timeout was called.  The patient was placed in the beach-chair position with the head in neutral  position.  Right arm, shoulder, and hand prescrubbed with alcohol and Betadine and allowed to air dry, prepped with ChloraPrep solution and draped in a sterile manner.  An incision was made first beginning with the axillary crease posteriorly, extending  proximally for about 7 cm.  Skin and subcutaneous tissue were sharply divided.  The abscess cavity was encountered inferiorly.  It did go medially towards the chest wall and anteriorly towards the humerus.  This cavity was decompressed.  Debridement was  performed with rongeur and curette.  Following this, an incision was made anteriorly from the axillary crease proximally about 6 cm.  Skin and subcutaneous tissue were sharply divided.  Abscess cavities  were then encountered anteriorly underneath the  clavicle as well as towards the chest wall and as well as medially and distally towards the posterior abscess cavity.  Specimens were obtained from both cavities.  Debridement also performed anteriorly in a similar manner using the curette and rongeur.   Following extensive excisional debridement as well as irrigation with 6 L of irrigating solution, Stimulan beads were made with vancomycin and gentamicin and placed into these abscess cavities.  Jackson-Pratt drains were then placed into these cavities  as well and sutured into place.  Loose closure over these areas was then performed.  It should be noted that the joint was palpated, and there was no fluid pocket within the joint itself.  There was no rotator cuff tear.  The joint itself had no  drainable fluid.  At this time, the incisions were closed using 2-0 Vicryl and 2-0 nylon in a very loose fashion.  Attention was then directed towards the [facility] joint.  An incision was made over the [facility] joint.  Skin and subcutaneous tissue sharply divided.   A fluid pocket was encountered, and it was not overtly infected but was sent for culture.  AC joint was debrided and the bone was removed.  There was some softness of the bone, but difficult to say with certainty whether or not this is osteomyelitis.   About 5 mm was removed with a rongeur in order to decompress the space.  Thorough irrigation was then performed and a drain was placed.  This incision was then closed using 3-0 nylon.  An Aquacel dressing was placed.  The patient tolerated the procedure  well without immediate complication and placed back into a sling.  LN/NUANCE  D:06/28/2018 T:06/28/2018 JOB:[id number]

## 2018-06-30 NOTE — Progress Notes (Signed)
PROGRESS NOTE  Clifford MassedJames Chan ZOX:[id number] DOB: 11/23/1965 DOA: 06/19/2018 PCP: Patient, No Pcp Per   LOS: 10 days    Brief narrative: Patient is a 53 year old with PMH significant for hepatitis C and substance abuse. He first presented to the ED on 5/5 complaining of increasing back pain radiating down the legs.  MRI of lumbar spine was obtained which showed severe foraminal narrowing at L5-S1 level due to anterolisthesis.  Per neurosurgery recommendation, patient was discharged on prednisone and Percocets with a plan to follow-up as an outpatient. Patient presented to the ED again after 6 days on 5/11 with worsening symptoms.  He also complained of right shoulder pain at that time.  On examination, he had cellulitis-like changes on all of his right upper extremities from shoulders to fingers.  WBC count was elevated to 19,000 and creatinine was elevated to 3.56.   Patient was admitted for sepsis secondary to right upper extremity cellulitis.  He was started on IV antibiotics and fluid despite which cellulitis did not improve.  5/19 -CT scan of right shoulder was obtained which showed multiple intramuscular and interfascial abscesses about the right shoulder along with septic [facility] of [facility]. 5/20 - R shoulder debridement of multiple abscesses and fluid in AC joint.  During the course of hospitalization, patient's back symptoms as well as bilateral lower extremity weakness got worse. 5/19-MRI of lumbar spine was repeated (report attached below) which basically showed  L4-L5 Discitis/L5-S1 discitis/L4-L5 septic arthritis/ multiple paraspinal abscesses.  During this hospitalization, patient also developed left knee swelling, pain and redness.  5/14 -left knee arthrocentesis was done, synovial fluid grew gram-positive cocci. 5/15-  left knee washout.    Subjective: Patient was seen and examined this morning.  Pleasant middle-aged Caucasian male.  Propped up in bed.  Not in distress.   States that his pain is tolerable.  Has 2 JP drain in shoulder, states that they are supposed to be removed today.  Continues to report weakness and lifting both lower extremities.  Assessment/Plan:  Principal Problem:   Septic arthritis of multiple joints (HCC) Active Problems:   Hepatitis C antibody test positive   Cigarette smoker   Intractable back pain   AKI (acute kidney injury) (HCC)   Spinal stenosis of lumbosacral region   GERD (gastroesophageal reflux disease)   Mild protein-calorie malnutrition (HCC)   Septic arthritis of knee, left (HCC)   Acute medial meniscal tear, left, initial encounter   Polysubstance abuse (HCC)   Normocytic anemia   Effusion of left elbow   Recurrent boils   Discitis of lumbosacral region   Psoas abscess, right (HCC)   Multiple abscesses of right shoulder   Chronic infection of left knee (HCC)  L4-L5 Discitis/L5-S1 discitis/L4-L5 septic arthritis/ multiple paraspinal abscesses  -Presented with back pain.   -MRI from 5/5 showed severe lumbar spinal stenosis and repeat MRI from 5/19 showed L4-L5 discitis/L5-S1 discitis/L4-L5 septic arthritis/ multiple paraspinal abscesses.  -Neurosurgery and ID following as well.   -No growth and blood culture.  Echocardiogram did not show any vegetation. -Currently on IV vancomycin.  Plan for prolonged course of IV antibiotics. -Per ID, patient will likely need repeated MRI nearing the end of therapy of his spine to reassess for total duration of antibiotics.   Septic arthritis of the left knee,  -5/14 -left knee arthrocentesis was done, synovial fluid grew gram-positive cocci. -5/15 -  left knee washout.   -Synovial fluid culture grew gram-positive cocci  Multiple right upper extremity abscesses  -  Initially admitted for left upper extremity cellulitis/sepsis -CT scan 5/19 showed multiple abscesses around right shoulder.  -5/20 - R shoulder debridement of multiple abscesses and fluid in [facility] joint. -Few  gram-positive cocci isolated in sample fluid culture. -Currently on IV vancomycin.  Left elbow pain  -If not improving, may need MRI to further evaluate.  AKI -Creatinine normal at baseline.  Elevated to 3.5 on admission likely secondary to sepsis.  - Renal function hasPatient creatinine o improved with IV fluid now.    Hyponatremia: -Persistent mild hyponatremia, 129 today.  Continue IV fluid.  Mobility: PT eval appreciated.  Recommend SNF/CIR Diet: Regular diet DVT prophylaxis:  Lovenox subcu Code Status:   Code Status: Full Code  Family Communication:  Disposition Plan:  SNF/CIR  Consultants:  Orthopedics, ID  Procedures: -5/14 -left knee arthrocentesis was done, synovial fluid grew gram-positive cocci. -5/15 - left knee washout.   -5/20 - R shoulder debridement of multiple abscesses and fluid in AC joint.  Antimicrobials: Currently on IV vancomycin Anti-infectives (From admission, onward)   Start     Dose/Rate Route Frequency Ordered Stop   06/29/18 1400  vancomycin (VANCOCIN) IVPB 750 mg/150 ml premix     750 mg 150 mL/hr over 60 Minutes Intravenous Every 12 hours 06/29/18 1342     06/29/18 0000  vancomycin IVPB     750 mg Intravenous Every 12 hours 06/29/18 1407 08/09/18 2359   06/28/18 1735  gentamicin (GARAMYCIN) injection  Status:  Discontinued       As needed 06/28/18 1736 06/28/18 1838   06/28/18 1734  vancomycin (VANCOCIN) powder  Status:  Discontinued       As needed 06/28/18 1735 06/28/18 1838   06/26/18 1300  vancomycin (VANCOCIN) 1,250 mg in sodium chloride 0.9 % 250 mL IVPB  Status:  Discontinued     1,250 mg 166.7 mL/hr over 90 Minutes Intravenous Every 12 hours 06/26/18 1057 06/26/18 1136   06/26/18 1300  vancomycin (VANCOCIN) IVPB 1000 mg/200 mL premix  Status:  Discontinued     1,000 mg 200 mL/hr over 60 Minutes Intravenous Every 12 hours 06/26/18 1136 06/29/18 1342   06/26/18 0000  vancomycin IVPB  Status:  Discontinued     1,000 mg Intravenous  Every 12 hours 06/26/18 1616 06/29/18    06/24/18 1200  vancomycin (VANCOCIN) 1,500 mg in sodium chloride 0.9 % 500 mL IVPB  Status:  Discontinued     1,500 mg 250 mL/hr over 120 Minutes Intravenous Every 24 hours 06/23/18 1040 06/26/18 1057   06/24/18 1000  cefTRIAXone (ROCEPHIN) 2 g in sodium chloride 0.9 % 100 mL IVPB  Status:  Discontinued     2 g 200 mL/hr over 30 Minutes Intravenous Every 24 hours 06/23/18 0857 06/24/18 1433   06/23/18 1848  vancomycin (VANCOCIN) powder  Status:  Discontinued       As needed 06/23/18 1848 06/23/18 1956   06/23/18 1847  gentamicin (GARAMYCIN) injection  Status:  Discontinued       As needed 06/23/18 1847 06/23/18 1956   06/23/18 0900  vancomycin (VANCOCIN) 1,500 mg in sodium chloride 0.9 % 500 mL IVPB     1,500 mg 250 mL/hr over 120 Minutes Intravenous  Once 06/23/18 0855 06/24/18 0814   06/23/18 0900  cefTRIAXone (ROCEPHIN) 1 g in sodium chloride 0.9 % 100 mL IVPB     1 g 200 mL/hr over 30 Minutes Intravenous  Once 06/23/18 0857 06/24/18 0814   06/20/18 0900  cefTRIAXone (ROCEPHIN) 1 g in sodium  chloride 0.9 % 100 mL IVPB  Status:  Discontinued     1 g 200 mL/hr over 30 Minutes Intravenous Every 24 hours 06/20/18 0824 06/23/18 0857      Infusions:  . sodium chloride 75 mL/hr at 06/30/18 0500  . methocarbamol (ROBAXIN) IV    . vancomycin Stopped (06/30/18 0348)    Scheduled Meds: . Chlorhexidine Gluconate Cloth  6 each Topical Daily  . diphenhydrAMINE  25 mg Oral Once  . docusate sodium  100 mg Oral BID  . enoxaparin (LOVENOX) injection  40 mg Subcutaneous Daily  . esomeprazole  20 mg Oral QAC breakfast  . folic acid  1 mg Oral Daily  . methocarbamol  500 mg Oral TID  . multivitamin with minerals  1 tablet Oral Daily  . mupirocin ointment  1 application Nasal BID  . sodium chloride flush  10-40 mL Intracatheter Q12H  . sucralfate  1 g Oral TID WC & HS  . thiamine  100 mg Oral Daily   Or  . thiamine  100 mg Intravenous Daily  .  traMADol  50 mg Oral Q6H    PRN meds: acetaminophen **OR** acetaminophen, alum & mag hydroxide-simeth, menthol-cetylpyridinium **OR** phenol, methocarbamol **OR** methocarbamol (ROBAXIN) IV, metoCLOPramide **OR** metoCLOPramide (REGLAN) injection, morphine injection, ondansetron **OR** ondansetron (ZOFRAN) IV, polyethylene glycol, sodium chloride flush   Objective: Vitals:   06/30/18 0353 06/30/18 0726  BP: 140/73 129/75  Pulse: 100 (!) 102  Resp: 18 20  Temp: 98.2 F (36.8 C) 98.2 F (36.8 C)  SpO2: 100% 96%    Intake/Output Summary (Last 24 hours) at 06/30/2018 1052 Last data filed at 06/30/2018 0600 Gross per 24 hour  Intake 3375.29 ml  Output 3440 ml  Net -64.71 ml   Filed Weights   06/19/18 1303 06/23/18 1708 06/28/18 1543  Weight: 74.8 kg 74.8 kg 74.8 kg   Weight change:  Body mass index is 23.68 kg/m.   Physical Exam: General exam: Pleasant middle-aged Caucasian male.  Propped up in bed.  Not in distress.  Pain controlled Skin: No rashes, lesions or ulcers. HEENT: Atraumatic, normocephalic, supple neck, no obvious bleeding Lungs: Clear to auscultation bilaterally CVS: Regular rate and rhythm, no murmur GI/Abd soft, nontender, nondistended, bowel sound present CNS: Alert, awake, oriented x3, reports weakness in both lower extremities, hip flexor muscle strength 3/5  Psychiatry: Anxious Extremities: No pedal edema, right shoulder postsurgical has JP drain in place.  Data Review: I have personally reviewed the laboratory data and studies available.  MRI of lumbar spine -  Joint fluid and bone marrow edema compatible with L5-S1 discitis osteomyelitis, right L4-5 septic arthritis, and right sacroiliac joint septic arthritis. No bony destructive changes at this time. Multiloculated fluid collections compatible with abscess within the right-sided paraspinal muscles from L2-S1, right retroperitoneal space posterior to the posterior renal fascia, right psoas muscle, right  iliacus muscle, right lateral pelvic floor muscles, right piriformis muscle, and the right-sided gluteus muscles extending below the field of view. Expansion the right L4-5 facet capsule from joint fluid effaces the right lateral recess and contacts the descending right L5 nerve root. Otherwise stable lumbar spondylosis greatest at L5-S1 where grade 1 anterolisthesis contributes to moderate to severe bilateral foraminal stenosis.  CT right shoulder - Multiple, at least 7, intramuscular and interfascial abscesses about the right shoulder as described above. The largest abscess involving the triceps, teres minor, and latissimus dorsi muscles measures 5.3 x 5.0 x 10.5 cm.  Widening of the acromioclavicular joint with prominent  fluid in the joint space. Septic arthritis is not excluded. No CT evidence of osteomyelitis.  Recent Labs  Lab 06/25/18 0400 06/26/18 0530 06/27/18 0742 06/28/18 0317 06/29/18 0500  WBC 34.5* 23.4* 19.5* 17.6* 17.3*  HGB 9.5* 9.3* 8.8* 8.4* 8.0*  HCT 28.8* 27.8* 26.6* 25.2* 24.0*  MCV 92.0 92.1 92.0 91.3 92.3  PLT 383 437* 486* 499* 488*   Recent Labs  Lab 06/25/18 0400 06/26/18 0530 06/27/18 0742 06/28/18 0317 06/29/18 0500  NA 130* 128* 131* 129* 129*  K 4.4 4.4 4.3 4.3 4.7  CL 101 103 97* 97* 96*  CO2 GLUCOSE 90 108* 104* 101* 148*  BUN 33* 29* 24* 23* 26*  CREATININE 1.00 0.91 0.91 0.88 0.93  CALCIUM 7.7* 7.9* 8.3* 8.1* 8.4*    Lorin Glass, MD  [facility] 06/30/2018

## 2018-06-30 NOTE — Progress Notes (Addendum)
[facility]  Date of Admission:  06/19/2018     Antibiotic Days: 11  Day 8 vancomycin      ASSESSMENT: Doing well following debridement of R shoulder and L knee. He understandably and very expectedly is still having back pain and leg weakness. I am hopeful that he will start having some slow improvements in these symptoms after another 1-2 weeks on IV antibiotics considering his extensive abscesses. He was not recommended for any surgery on lumbosacral discitis from neurosurgery's evaluation. He was ruled out for endocarditis with TTE.   He asked me to call and discuss with his daughter, [name]. She has concerns that her father would be unable to do antibiotics at home through the IV. She acknowledges that he is very well intended but knowing him she is not sure that he will give himself the care he needs as he physically improves and seems to prefer SNF for treatment. She would like to speak with the case manager team further about options. I agree that a SNF would be preferred to get him the best outcome here.   Hepatitis C treatment - will see him outpatient for consideration of treatment. He seems very willing to address this in the future as well.   Creatinine remains stable < 1. WBC improving.   Will sign off, however if the patient has further surgery that is unexpected or changes to his condition please give [place] a call back to see him again. Thank you.    PLAN: 1. Continue vancomycin  2. OPAT orders re-defined below 3. He and his daughter would like to consider SNF placement for rehab and IV antibiotic administration. 4. Hep C treatment outpatient following treatment of his current acute bacterial infection    OPAT ORDERS:  Diagnosis: Septic arthritis of right shoulder, left knee, lumbosacral discitis with paraspinal/psoas muscle abscesses  Culture Result: GPCs on gram stain from multiple operative sites but no growth from any sites  including blood  No Known Allergies  Discharge antibiotics: Per pharmacy protocol Vancomycin  Goal trough 15-20 (unless otherwise indicated)  Duration: 6 weeks  End Date: July 1st   [place] and Maintenance Per Protocol Please use [facility] for care and maintenance __ Please pull PIC at completion of IV antibiotics _x_ Please leave PIC in place until doctor has seen patient or been notified  Labs weekly while on IV antibiotics: _x_ CBC with differential __ BMP _x_ BMP TWICE WEEKLY** __ CMP _x_ CRP _x_ ESR _x_ Vancomycin trough  Fax weekly labs to [phone]  Clinic Follow Up Appt: June 29th @ 10:30 am with Dr. Megan Salon      Principal Problem:   Septic arthritis of multiple joints ([place]) Active Problems:   Septic arthritis of knee, left ([place])   Effusion of left elbow   Discitis of lumbosacral region   Psoas abscess, right ([place])   Multiple abscesses of right shoulder   Hepatitis C antibody test positive   Polysubstance abuse ([place])   Cigarette smoker   Intractable back pain   AKI (acute kidney injury) ([place])   Spinal stenosis of lumbosacral region   GERD (gastroesophageal reflux disease)   Mild protein-calorie malnutrition ([facility])   Acute medial meniscal tear, left, initial encounter   Normocytic anemia   Recurrent boils   Chronic infection of left knee ([place])   Scheduled Meds: . Chlorhexidine Gluconate Cloth  6 each Topical Daily  . diphenhydrAMINE  25 mg Oral  Once  . docusate sodium  100 mg Oral BID  . enoxaparin (LOVENOX) injection  40 mg Subcutaneous Daily  . esomeprazole  20 mg Oral QAC breakfast  . folic acid  1 mg Oral Daily  . methocarbamol  500 mg Oral TID  . multivitamin with minerals  1 tablet Oral Daily  . mupirocin ointment  1 application Nasal BID  . sodium chloride flush  10-40 mL Intracatheter Q12H  . sucralfate  1 g Oral TID WC & HS  . thiamine  100 mg Oral Daily   Or  . thiamine  100 mg Intravenous Daily  . traMADol  50 mg Oral Q6H    Continuous Infusions: . sodium chloride 75 mL/hr at 06/30/18 0500  . methocarbamol (ROBAXIN) IV    . vancomycin Stopped (06/30/18 0348)   PRN Meds:.acetaminophen **OR** acetaminophen, alum & mag hydroxide-simeth, menthol-cetylpyridinium **OR** phenol, methocarbamol **OR** methocarbamol (ROBAXIN) IV, metoCLOPramide **OR** metoCLOPramide (REGLAN) injection, morphine injection, ondansetron **OR** ondansetron (ZOFRAN) IV, polyethylene glycol, sodium chloride flush   SUBJECTIVE: Painful shoulder this morning - just had drains removed and the posterior drain was very sore to him. Otherwise feeling well. Having a hard time lifting both legs. Sat on the side of the bed with PT yesterday and says it went well and looking forward to doing that again today. He acknowledges that he will not be able to take care of himself at home with all that is going on and open to rehab/SNF placement for a period of time.    GPCs on stain from shoulder noted in 1/2 surgical specimens.    Review of Systems: Review of Systems  Constitutional: Negative for chills, diaphoresis, fever, malaise/fatigue and weight loss.  Respiratory: Negative for cough, sputum production and shortness of breath.   Cardiovascular: Negative for chest pain.  Gastrointestinal: Negative for abdominal pain, diarrhea, heartburn, nausea and vomiting.  Musculoskeletal: Positive for back pain and joint pain.  Skin: Negative for itching and rash.  Neurological: Positive for focal weakness (R leg ) and weakness.  Psychiatric/Behavioral: Positive for substance abuse (cocaine use).    No Known Allergies  OBJECTIVE: Vitals:   06/29/18 2346 06/30/18 0353 06/30/18 0726 06/30/18 1116  BP: 139/73 140/73 129/75 (!) 154/80  Pulse: 96 100 (!) 102 (!) 105  Resp: '18 18 20 16  ' Temp: 97.9 F (36.6 C) 98.2 F (36.8 C) 98.2 F (36.8 C) 98.5 F (36.9 C)  TempSrc: Oral Oral Oral Oral  SpO2: 98% 100% 96% 99%  Weight:      Height:       Body mass  index is 23.68 kg/m.  Physical Exam Constitutional:      Comments: Resting in bed. Smiling and pleasant.   Cardiovascular:     Rate and Rhythm: Normal rate and regular rhythm.     Heart sounds: No murmur.  Pulmonary:     Effort: Pulmonary effort is normal.     Breath sounds: Normal breath sounds.  Abdominal:     Palpations: Abdomen is soft.     Tenderness: There is no abdominal tenderness.  Musculoskeletal:     Comments: Unable to raise his right leg off the bed to gravity. Can move both feet/ankles with full ROM. L elbow/shoulder moving freely with normal ROM.  R shoulder in sling  Skin:    Findings: No rash.     Comments: R shoulder with aquacell surgical dressings in place.   Psychiatric:        Mood and Affect: Mood normal.  LUE PICC line - clean/dry dressing. Insertion site w/o erythema, tenderness, drainage, cording or distal swelling of affected extremity    Lab Results Lab Results  Component Value Date   WBC 17.3 (H) 06/29/2018   HGB 8.0 (L) 06/29/2018   HCT 24.0 (L) 06/29/2018   MCV 92.3 06/29/2018   PLT 488 (H) 06/29/2018    Lab Results  Component Value Date   CREATININE 0.93 06/29/2018   BUN 26 (H) 06/29/2018   NA 129 (L) 06/29/2018   K 4.7 06/29/2018   CL 96 (L) 06/29/2018   CO2 25 06/29/2018    Lab Results  Component Value Date   ALT 27 06/24/2018   AST 24 06/24/2018   ALKPHOS 94 06/24/2018   BILITOT 1.1 06/24/2018     Microbiology: Recent Results (from the past 240 hour(s))  Body fluid culture     Status: None   Collection Time: 06/22/18  1:05 PM  Result Value Ref Range Status   Specimen Description SYNOVIAL  Final   Special Requests KNEE  Final   Gram Stain   Final    MODERATE WBC PRESENT, PREDOMINANTLY PMN RARE GRAM POSITIVE COCCI IN PAIRS AND CHAINS    Culture   Final    NO GROWTH 4 DAYS Performed at [facility], [place] [address] [place], [place] [address]    Report Status 06/26/2018 FINAL  Final  Surgical pcr screen      Status: Abnormal   Collection Time: 06/23/18  4:40 PM  Result Value Ref Range Status   MRSA, PCR NEGATIVE NEGATIVE Final   Staphylococcus aureus POSITIVE (A) NEGATIVE Final    Comment: (NOTE) The Xpert SA Assay (FDA approved for NASAL specimens in patients 62 years of age and older), is one component of a comprehensive surveillance program. It is not intended to diagnose infection nor to guide or monitor treatment. Performed at [facility], [place] [address], [place], [place] [address]   Aerobic/Anaerobic Culture (surgical/deep wound)     Status: None   Collection Time: 06/23/18  6:53 PM  Result Value Ref Range Status   Specimen Description SYNOVIAL LEFT KNEE  Final   Special Requests SWABS  Final   Gram Stain   Final    ABUNDANT WBC PRESENT, PREDOMINANTLY PMN RARE GRAM POSITIVE COCCI    Culture   Final    No growth aerobically or anaerobically. Performed at [facility], [place] [address], [place], [place] [address]    Report Status 06/28/2018 FINAL  Final  Culture, blood (routine x 2)     Status: None (Preliminary result)   Collection Time: 06/28/18 11:15 AM  Result Value Ref Range Status   Specimen Description BLOOD RIGHT ANTECUBITAL  Final   Special Requests   Final    BOTTLES DRAWN AEROBIC ONLY Blood Culture adequate volume   Culture   Final    NO GROWTH 2 DAYS Performed at [facility], [place] [address] [place], [place] [address]    Report Status PENDING  Incomplete  Culture, blood (routine x 2)     Status: None (Preliminary result)   Collection Time: 06/28/18 11:20 AM  Result Value Ref Range Status   Specimen Description BLOOD RIGHT HAND  Final   Special Requests   Final    BOTTLES DRAWN AEROBIC ONLY Blood Culture results may not be optimal due to an inadequate volume of blood received in culture bottles   Culture   Final    NO GROWTH  2 DAYS Performed at [facility], [place] [address], [place], [place] [address]    Report Status PENDING   Incomplete  Aerobic/Anaerobic Culture (surgical/deep wound)     Status: None (Preliminary result)   Collection Time: 06/28/18  5:27 PM  Result Value Ref Range Status   Specimen Description ABSCESS SHOULDER POSTERIOR  Final   Special Requests PATIENT ON FOLLOWING VANC  Final   Gram Stain   Final    FEW WBC PRESENT, PREDOMINANTLY PMN FEW GRAM POSITIVE COCCI    Culture   Final    NO GROWTH 2 DAYS NO ANAEROBES ISOLATED; CULTURE IN PROGRESS FOR 5 DAYS Performed at [facility], [place] [address], [place], [place] [address]    Report Status PENDING  Incomplete  Aerobic/Anaerobic Culture (surgical/deep wound)     Status: None (Preliminary result)   Collection Time: 06/28/18  6:06 PM  Result Value Ref Range Status   Specimen Description JOINT FLUID AC  Final   Special Requests PATIENT ON FOLLOWING VANC FLUID ON SWABS  Final   Gram Stain   Final    FEW WBC PRESENT, PREDOMINANTLY PMN NO ORGANISMS SEEN    Culture   Final    NO GROWTH 2 DAYS NO ANAEROBES ISOLATED; CULTURE IN PROGRESS FOR 5 DAYS Performed at [facility], [place] [address] [place], [place] [address]    Report Status PENDING  Incomplete     Janene Madeira, MSN, NP-C [facility] [email or web address] Pager: [phone] Office: [phone] [place]: [phone]

## 2018-06-30 NOTE — Progress Notes (Signed)
Physical Therapy Treatment Patient Details Name: [name] MRN: [id number] DOB: 03/07/1965 Today's Date: 06/30/2018    History of Present Illness Pt. with PMH of hep C, substance abuse; admitted on 06/19/2018, presented with complaint of back pain, was found to have musculoskeletal back pain as well as right upper extremity cellulitis; Patient had a debridement of his shoulder perfromed on 5/20.     PT Comments    Patient seen for mobility progression. Pt requires +2 assist for bed mobility this session. Pt demonstrates improved sitting balance EOB and initially required mod A but progressed to min guard/supervision. Pt declined attempting to stand/transfer this session.  Pt premedicated. Continue to progress as tolerated.    Follow Up Recommendations  SNF;CIR     Equipment Recommendations  Other (comment)(TBD )    Recommendations for Other Services       Precautions / Restrictions Restrictions Weight Bearing Restrictions: Yes RUE Weight Bearing: Non weight bearing Other Position/Activity Restrictions: L LE WBAT    Mobility  Bed Mobility Overal bed mobility: Needs Assistance Bed Mobility: Supine to Sit;Sit to Supine     Supine to sit: +2 for physical assistance;Max assist Sit to supine: Max assist;+2 for physical assistance   General bed mobility comments: mutlimodal cues for sequencing and assistance required to bring bilat LE, hips to EOB and to elevate trunk into sitting using bed rail going toward L side; pt followed instruction with repetition and "convincing" pt that he can "do it"  Transfers                 General transfer comment: pt declined attempting to stand despite encouragement  Ambulation/Gait                 Stairs             Wheelchair Mobility    Modified Rankin (Stroke Patients Only)       Balance Overall balance assessment: Needs assistance Sitting-balance support: Single extremity supported Sitting balance-Leahy  Scale: Poor Sitting balance - Comments: improved upright posture today                                    Cognition Arousal/Alertness: Awake/alert Behavior During Therapy: WFL for tasks assessed/performed;Anxious Overall Cognitive Status: Within Functional Limits for tasks assessed                                 General Comments: pt anxious about mobilizing; agreeable to sitting EOB only this session       Exercises      General Comments        Pertinent Vitals/Pain Pain Assessment: Faces Faces Pain Scale: Hurts even more Pain Location: Low Back; left knee; right shoulder  Pain Descriptors / Indicators: Sore;Grimacing;Guarding Pain Intervention(s): Limited activity within patient's tolerance;Monitored during session;Premedicated before session;Repositioned    Home Living                      Prior Function            PT Goals (current goals can now be found in the care plan section) Progress towards PT goals: Progressing toward goals    Frequency    Min 3X/week      PT Plan Current plan remains appropriate    Co-evaluation  AM-PAC PT "6 Clicks" Mobility   Outcome Measure  Help needed turning from your back to your side while in a flat bed without using bedrails?: A Lot Help needed moving from lying on your back to sitting on the side of a flat bed without using bedrails?: A Lot Help needed moving to and from a bed to a chair (including a wheelchair)?: Total Help needed standing up from a chair using your arms (e.g., wheelchair or bedside chair)?: Total Help needed to walk in hospital room?: Total Help needed climbing 3-5 steps with a railing? : Total 6 Click Score: 8    End of Session Equipment Utilized During Treatment: Gait belt;Other (comment)(UE sling ) Activity Tolerance: Patient limited by pain Patient left: in bed;with call bell/phone within reach;with bed alarm set;with SCD's  reapplied Nurse Communication: Mobility status PT Visit Diagnosis: Unsteadiness on feet (R26.81);Muscle weakness (generalized) (M62.81);Pain;Other abnormalities of gait and mobility (R26.89) Pain - Right/Left: Right Pain - part of body: Arm     Time: 1412-1450 PT Time Calculation (min) (ACUTE ONLY): 38 min  Charges:  $Therapeutic Activity: 23-37 mins                     Erline Levine, PTA Acute Rehabilitation Services Pager: [phone] Office: [phone]     Carolynne Edouard 06/30/2018, 5:26 PM

## 2018-06-30 NOTE — Plan of Care (Signed)
Patient stable, discussed POC with patient, agreeable with plan, tolerates IV & PO pain rx well, denies question/concerns at this time.

## 2018-07-01 LAB — VANCOMYCIN, PEAK: Vancomycin Pk: 16 ug/mL — ABNORMAL LOW (ref 30–40)

## 2018-07-01 LAB — VANCOMYCIN, TROUGH: Vancomycin Tr: 12 ug/mL — ABNORMAL LOW (ref 15–20)

## 2018-07-01 NOTE — Progress Notes (Signed)
PROGRESS NOTE  [name] ZOX:[id number] DOB: Aug 10, 1965 DOA: 06/19/2018 PCP: Patient, No Pcp Per   LOS: 11 days   Brief narrative: Patient is a 53 year old with PMH significant for hepatitis C and substance abuse. He first presented to the ED on 5/5 complaining of increasing back pain radiating down the legs.  MRI of lumbar spine was obtained which showed severe foraminal narrowing at L5-S1 level due to anterolisthesis.  Per neurosurgery recommendation, patient was discharged on prednisone and Percocets with a plan to follow-up as an outpatient. Patient presented to the ED again after 6 days on 5/11 with worsening symptoms.  He also complained of right shoulder pain at that time.  On examination, he had cellulitis-like changes on all of his right upper extremities from shoulders to fingers.  WBC count was elevated to 19,000 and creatinine was elevated to 3.56.   Patient was admitted for sepsis secondary to right upper extremity cellulitis.  He was started on IV antibiotics and fluid despite which cellulitis did not improve.  5/19 -CT scan of right shoulder was obtained which showed multiple intramuscular and interfascial abscesses about the right shoulder along with septic arthritis of the [facility] joint. 5/20 - R shoulder debridement of multiple abscesses and fluid in AC joint.  During the course of hospitalization, patient's back symptoms as well as bilateral lower extremity weakness got worse. 5/19-MRI of lumbar spine was repeated (report attached below) which basically showed  L4-L5 Discitis/L5-S1 discitis/L4-L5 septic arthritis/ multiple paraspinal abscesses.  During this hospitalization, patient also developed left knee swelling, pain and redness.  5/14 - left knee arthrocentesis was done, synovial fluid grew gram-positive cocci. 5/15 - left knee washout.   Subjective: Patient was seen and examined this morning.  Pleasant middle-aged male.  Not in distress.  Feels better after JP drain  were removed from the shoulder yesterday.  Assessment/Plan:  Principal Problem:   Septic arthritis of multiple joints (HCC) Active Problems:   Hepatitis C antibody test positive   Cigarette smoker   Intractable back pain   AKI (acute kidney injury) (HCC)   Spinal stenosis of lumbosacral region   GERD (gastroesophageal reflux disease)   Mild protein-calorie malnutrition (HCC)   Septic arthritis of knee, left (HCC)   Acute medial meniscal tear, left, initial encounter   Polysubstance abuse (HCC)   Normocytic anemia   Effusion of left elbow   Recurrent boils   Discitis of lumbosacral region   Psoas abscess, right (HCC)   Multiple abscesses of right shoulder   Chronic infection of left knee (HCC)  L4-L5 Discitis/L5-S1 discitis/L4-L5 septic arthritis/ multiple paraspinal abscesses  -Presented with back pain.   -MRI from 5/5 showed severe lumbar spinal stenosis and repeat MRI from 5/19 showed L4-L5 discitis/L5-S1 discitis/L4-L5 septic arthritis/ multiple paraspinal abscesses.  -Neurosurgery and ID following as well.   -No growth and blood culture.  Echocardiogram did not show any vegetation. -Currently on IV vancomycin.  Plan for prolonged course of IV antibiotics. -Per ID, patient will likely need repeated MRI nearing the end of therapy of his spine to reassess for total duration of antibiotics.   Septic arthritis of the left knee, -5/14 -left knee arthrocentesis was done, synovial fluid grew gram-positive cocci. -5/15 - left knee washout.  -Synovial fluid culture grew gram-positive cocci -WBC slightly improved today.  No fever.  Continue to monitor temperature and WBC trend.  Multiple right upper extremity abscesses  -Initially admitted for left upper extremity cellulitis/sepsis -CT scan 5/19 showed multiple abscesses around right shoulder.  -  5/20 - R shoulder debridement of multiple abscesses and fluid in AC joint. -Few gram-positive cocci isolated in sample fluid culture.  -Currently on IV vancomycin.  Left elbow pain  -If not improving, may need MRI to further evaluate.  AKI -Creatinine normal at baseline.  Elevated to 3.5 on admission likely secondary to sepsis.  - Renal function hasPatient creatinine o improved with IV fluid now.    Hyponatremia: -Persistent mild hyponatremia, 129 today.  Continue IV fluid.  Mobility: PT eval appreciated.  Recommend SNF/CIR Diet: Regular diet DVT prophylaxis: Lovenox subcu Code Status:  Code Status: Full Code  Family Communication: Disposition Plan: SNF/CIR  Consultants:  Orthopedics, ID  Procedures: -5/14 -left knee arthrocentesis was done, synovial fluid grew gram-positive cocci. -5/15 - left knee washout.  -5/20 - R shoulder debridement of multiple abscesses and fluid in AC joint.  Antimicrobials: Currently on IV vancomycin Anti-infectives (From admission, onward)   Start     Dose/Rate Route Frequency Ordered Stop   06/29/18 1400  vancomycin (VANCOCIN) IVPB 750 mg/150 ml premix     750 mg 150 mL/hr over 60 Minutes Intravenous Every 12 hours 06/29/18 1342     06/29/18 0000  vancomycin IVPB     750 mg Intravenous Every 12 hours 06/29/18 1407 08/09/18 2359   06/28/18 1735  gentamicin (GARAMYCIN) injection  Status:  Discontinued       As needed 06/28/18 1736 06/28/18 1838   06/28/18 1734  vancomycin (VANCOCIN) powder  Status:  Discontinued       As needed 06/28/18 1735 06/28/18 1838   06/26/18 1300  vancomycin (VANCOCIN) 1,250 mg in sodium chloride 0.9 % 250 mL IVPB  Status:  Discontinued     1,250 mg 166.7 mL/hr over 90 Minutes Intravenous Every 12 hours 06/26/18 1057 06/26/18 1136   06/26/18 1300  vancomycin (VANCOCIN) IVPB 1000 mg/200 mL premix  Status:  Discontinued     1,000 mg 200 mL/hr over 60 Minutes Intravenous Every 12 hours 06/26/18 1136 06/29/18 1342   06/26/18 0000  vancomycin IVPB  Status:  Discontinued     1,000 mg Intravenous Every 12 hours 06/26/18 1616 06/29/18     06/24/18 1200  vancomycin (VANCOCIN) 1,500 mg in sodium chloride 0.9 % 500 mL IVPB  Status:  Discontinued     1,500 mg 250 mL/hr over 120 Minutes Intravenous Every 24 hours 06/23/18 1040 06/26/18 1057   06/24/18 1000  cefTRIAXone (ROCEPHIN) 2 g in sodium chloride 0.9 % 100 mL IVPB  Status:  Discontinued     2 g 200 mL/hr over 30 Minutes Intravenous Every 24 hours 06/23/18 0857 06/24/18 1433   06/23/18 1848  vancomycin (VANCOCIN) powder  Status:  Discontinued       As needed 06/23/18 1848 06/23/18 1956   06/23/18 1847  gentamicin (GARAMYCIN) injection  Status:  Discontinued       As needed 06/23/18 1847 06/23/18 1956   06/23/18 0900  vancomycin (VANCOCIN) 1,500 mg in sodium chloride 0.9 % 500 mL IVPB     1,500 mg 250 mL/hr over 120 Minutes Intravenous  Once 06/23/18 0855 06/24/18 0814   06/23/18 0900  cefTRIAXone (ROCEPHIN) 1 g in sodium chloride 0.9 % 100 mL IVPB     1 g 200 mL/hr over 30 Minutes Intravenous  Once 06/23/18 0857 06/24/18 0814   06/20/18 0900  cefTRIAXone (ROCEPHIN) 1 g in sodium chloride 0.9 % 100 mL IVPB  Status:  Discontinued     1 g 200 mL/hr over 30 Minutes Intravenous  Every 24 hours 06/20/18 0824 06/23/18 0857      Infusions:  . sodium chloride 75 mL/hr at 07/01/18 1127  . methocarbamol (ROBAXIN) IV    . vancomycin 750 mg (07/01/18 1453)    Scheduled Meds: . diphenhydrAMINE  25 mg Oral Once  . docusate sodium  100 mg Oral BID  . enoxaparin (LOVENOX) injection  40 mg Subcutaneous Daily  . esomeprazole  20 mg Oral QAC breakfast  . folic acid  1 mg Oral Daily  . methocarbamol  500 mg Oral TID  . multivitamin with minerals  1 tablet Oral Daily  . mupirocin ointment  1 application Nasal BID  . sodium chloride flush  10-40 mL Intracatheter Q12H  . sucralfate  1 g Oral TID WC & HS  . thiamine  100 mg Oral Daily   Or  . thiamine  100 mg Intravenous Daily  . traMADol  50 mg Oral Q6H    PRN meds: acetaminophen **OR** acetaminophen, alum & mag hydroxide-simeth,  menthol-cetylpyridinium **OR** phenol, methocarbamol **OR** methocarbamol (ROBAXIN) IV, metoCLOPramide **OR** metoCLOPramide (REGLAN) injection, morphine injection, ondansetron **OR** ondansetron (ZOFRAN) IV, polyethylene glycol, sodium chloride flush   Objective: Vitals:   07/01/18 0731 07/01/18 1108  BP: 138/77 (!) 149/81  Pulse: (!) 102 (!) 103  Resp: 18 18  Temp: 98.1 F (36.7 C) 98.3 F (36.8 C)  SpO2: 99% 98%    Intake/Output Summary (Last 24 hours) at 07/01/2018 1607 Last data filed at 07/01/2018 1127 Gross per 24 hour  Intake 2132.48 ml  Output 2920 ml  Net -787.52 ml   Filed Weights   06/19/18 1303 06/23/18 1708 06/28/18 1543  Weight: 74.8 kg 74.8 kg 74.8 kg   Weight change:  Body mass index is 23.68 kg/m.   Physical Exam: General exam: Appears calm and comfortable.  Skin: No rashes, lesions or ulcers. HEENT: Atraumatic, normocephalic, supple neck, no obvious bleeding Lungs: Clear to auscultation bilaterally CVS: Regular rate rhythm, no murmur GI/Abd soft, nontender, nondistended, bowel sound present CNS: Alert, awake, oriented x3, continues to complain of weakness on both lower extremities Psychiatry: Mood appropriate Extremities: No pedal edema, right shoulder swelling improving, JP drain out yesterday.  Data Review: I have personally reviewed the laboratory data and studies available.  Recent Labs  Lab 06/25/18 0400 06/26/18 0530 06/27/18 0742 06/28/18 0317 06/29/18 0500  WBC 34.5* 23.4* 19.5* 17.6* 17.3*  HGB 9.5* 9.3* 8.8* 8.4* 8.0*  HCT 28.8* 27.8* 26.6* 25.2* 24.0*  MCV 92.0 92.1 92.0 91.3 92.3  PLT 383 437* 486* 499* 488*   Recent Labs  Lab 06/25/18 0400 06/26/18 0530 06/27/18 0742 06/28/18 0317 06/29/18 0500  NA 130* 128* 131* 129* 129*  K 4.4 4.4 4.3 4.3 4.7  CL 101 103 97* 97* 96*  CO2 24 22 24 26 25   GLUCOSE 90 108* 104* 101* 148*  BUN 33* 29* 24* 23* 26*  CREATININE 1.00 0.91 0.91 0.88 0.93  CALCIUM 7.7* 7.9* 8.3* 8.1* 8.4*    Lorin Glass, MD  [facility] 07/01/2018

## 2018-07-01 NOTE — Progress Notes (Signed)
Pharmacy Antibiotic Note  [name] is a 53 y.o. male admitted on 06/19/2018 with back pain, now with septic arthritis of multiple joints. Pharmacy has been consulted for vancomycin dosing. Patient is s/p I&D of R shoulder and the [facility] joint on 5/20.  Vancomycin levels demonstrate therapeutic AUC of 445 mcg*h/ml, Cr has remained stable, planning for 6 weeks of ABX per ID. OPAT has been entered, no changes for now.   Plan: -Continue vancomycin 750mg  IV q12h -Monitor renal function, vancomycin levels as needed  Height: 5\' 10"  (177.8 cm) Weight: 165 lb (74.8 kg) IBW/kg (Calculated) : 73  Temp (24hrs), Avg:98.4 F (36.9 C), Min:98.1 F (36.7 C), Max:99.1 F (37.3 C)  Recent Labs  Lab 06/25/18 0400 06/26/18 0530 06/27/18 0742 06/28/18 0317  06/29/18 0500 06/29/18 0700 06/29/18 1237 07/01/18 1100 07/01/18 1502  WBC 34.5* 23.4* 19.5* 17.6*  --  17.3*  --   --   --   --   CREATININE 1.00 0.91 0.91 0.88  --  0.93  --   --   --   --   VANCOTROUGH  --   --   --   --    < >  --   --  19  --  12*  VANCOPEAK  --   --   --   --   --   --  24*  --  16*  --    < > = values in this interval not displayed.    Estimated Creatinine Clearance: 94.8 mL/min (by C-G formula based on SCr of 0.93 mg/dL).    No Known Allergies   Vanc 5/15 >> CTX 5/12 >> 5/16  5/11 covid - negative 5/12 BCx - negative 5/14 synovial fluid L knee - negative 5/15 synovial fluid L knee - GPC on stain 5/20 BCx: ng x 1 day 5/20 abscess shoulder posterior - few GPC on stain 5/20 AC joint fluid:   Thank you for allowing pharmacy to be a part of this patient's care.   Fredonia Highland, PharmD, BCPS Clinical Pharmacist [phone] Please check AMION for all [facility] Pharmacy numbers 07/01/2018

## 2018-07-02 LAB — BASIC METABOLIC PANEL
Anion gap: 10 (ref 5–15)
BUN: 18 mg/dL (ref 6–20)
CO2: 28 mmol/L (ref 22–32)
Calcium: 8.5 mg/dL — ABNORMAL LOW (ref 8.9–10.3)
Chloride: 93 mmol/L — ABNORMAL LOW (ref 98–111)
Creatinine, Ser: 0.77 mg/dL (ref 0.61–1.24)
GFR calc Af Amer: >60 mL/min (ref >60)
GFR calc non Af Amer: >60 mL/min (ref >60)
Glucose, Bld: 140 mg/dL — ABNORMAL HIGH (ref 70–99)
Potassium: 3.8 mmol/L (ref 3.5–5.1)
Sodium: 131 mmol/L — ABNORMAL LOW (ref 135–145)

## 2018-07-02 MED ORDER — SENNOSIDES-DOCUSATE SODIUM 8.6-50 MG PO TABS
1.0000 | ORAL_TABLET | Freq: Two times a day (BID) | ORAL | Status: DC
  Administered 2018-07-02 – 2018-07-26 (×50): 1 via ORAL
  Filled 2018-07-02 (×49): qty 1

## 2018-07-02 MED ORDER — OXYCODONE-ACETAMINOPHEN 5-325 MG PO TABS
1.0000 | ORAL_TABLET | Freq: Four times a day (QID) | ORAL | Status: DC | PRN
  Administered 2018-07-02 – 2018-08-01 (×94): 1 via ORAL
  Filled 2018-07-02 (×94): qty 1

## 2018-07-02 NOTE — Progress Notes (Signed)
PROGRESS NOTE  Carollee MassedJames Hermann RUE:[id number] DOB: 08/16/1965 DOA: 06/19/2018 PCP: Patient, No Pcp Per   LOS: 12 days   Brief narrative: Patient is a[age] year old withPMHsignificant for hepatitis C and substance abuse. He first presented to the ED on 5/5 complaining of increasing back pain radiating down the legs. MRI of lumbar spine was obtained which showed severe foraminal narrowing at L5-S1 level due to anterolisthesis.Per neurosurgery recommendation, patient was discharged on prednisone and Percocets with a plan to follow-up as an outpatient. Patient presented to the ED again after 6 days on 5/11 with worsening symptoms.  He also complained of right shoulder pain at that time. On examination, he had cellulitis-like changes on all of his right upper extremities from shoulders to fingers. WBC count was elevated to 19,000 and creatinine was elevated to 3.56.   Patient was admitted for sepsis secondary to right upper extremity cellulitis. He was started on IV antibiotics and fluid despite which cellulitis did not improve.  5/19-CT scan of right shoulder was obtained which showed multipleintramuscular and interfascial abscesses about the right shoulderalong with septic arthritis of the [facility]. 5/20-R shoulder debridement of multiple abscesses and fluid [facility].  During the course of hospitalization, patient's back symptoms as well as bilateral lower extremity weakness got worse. 5/19-MRI of lumbar spine was repeatedwhich basically showedL4-L5Discitis/ L5-S1 discitis/ L4-L5septic arthritis/ multipleparaspinal abscesses.  During this hospitalization, patient alsodeveloped left knee swelling, pain and redness. 5/14- left kneearthrocentesiswas done, synovial fluidgrew gram-positive cocci. 5/15 - left knee washout.   Subjective: Patient was seen and examined this morning.  Lying in bed.  Not in distress.  Pain controlled.  Last bowel movement 6 days ago.    Assessment/Plan:  Principal Problem:   Septic arthritis of multiple joints (HCC) Active Problems:   Hepatitis C antibody test positive   Cigarette smoker   Intractable back pain   AKI (acute kidney injury) (HCC)   Spinal stenosis of lumbosacral region   GERD (gastroesophageal reflux disease)   Mild protein-calorie malnutrition (HCC)   Septic arthritis of knee, left (HCC)   Acute medial meniscal tear, left, initial encounter   Polysubstance abuse (HCC)   Normocytic anemia   Effusion of left elbow   Recurrent boils   Discitis of lumbosacral region   Psoas abscess, right (HCC)   Multiple abscesses of right shoulder   Chronic infection of left knee (HCC)  L4-L5Discitis/L5-S1 discitis/L4-L5septic arthritis/ multipleparaspinal abscesses -Presented on 5/11 with back pain. -Previous MRI from 5/5 had shown severe lumbar spinal stenosis. -Repeat MRI from 5/19 showedL4-L5 discitis/L5-S1 discitis/L4-L5septic arthritis/ multipleparaspinal abscesses.  -Neurosurgery and ID consults were obtained. -No growth and blood culture. Echocardiogram did not show any vegetation. -Currently on IV vancomycin. Plan for prolonged course of IV antibiotics. -Per ID, patient willlikely need repeated MRI nearing the end of therapy of his spine to reassess for total duration of antibiotics. -Continues to have weakness in both lower extremities more on left than right.  Septic arthritis of the left knee, -5/14- left kneearthrocentesiswas done, synovial fluidgrew gram-positive cocci. -5/15 -left knee washout.  -WBC slightly improved today. No fever. Continue to monitor temperature and WBC trend.  Multiple right upper extremity abscesses -Initially admitted for left upper extremity cellulitis/sepsis -5/19-CT scan of right shoulder was obtained which showed multipleintramuscular and interfascial abscesses about the right shoulderalong with septic [facility]. -5/20-R  shoulder debrid[facility]. -Few gram-positive cocci isolated in fluid culture. -Currently on IV vancomycin.  AKI -  Creatinine normal at baseline. Elevated to 3.5 on admission likely secondary to sepsis.  -Renal function has improved with IV fluid now.   Hyponatremia: -Persistent mild hyponatremia, 131 today. Continue IV fluid.  Constipation -Last BM 6 days ago.  Start on Sennokot-S BID and Miralax PRN  History of polysubstance abuse -Admits to doing cocaine prior to admission. -Adamantly refuses on multiple occasions that he never did any IV heroin.  Mobility:PT eval appreciated. Recommend SNF/CIR Diet:Regular diet DVT prophylaxis:Lovenox subcu Code Status:Code Status: Full Code Family Communication: Disposition Plan:SNF/CIR  Consultants:  Orthopedics, ID  Procedures: -5/14- left kneearthrocentesiswas done, synovial fluidgrew gram-positive cocci. -5/15 -left knee washout. -5/20-R shoulder debridement of multiple abscesses and fluid in [facility] joint.  Antimicrobials:Currently on IV vancomycin Anti-infectives (From admission, onward)   Start     Dose/Rate Route Frequency Ordered Stop   06/29/18 1400  vancomycin (VANCOCIN) IVPB 750 mg/150 ml premix     750 mg 150 mL/hr over 60 Minutes Intravenous Every 12 hours 06/29/18 1342     06/29/18 0000  vancomycin IVPB     750 mg Intravenous Every 12 hours 06/29/18 1407 08/09/18 2359   06/28/18 1735  gentamicin (GARAMYCIN) injection  Status:  Discontinued       As needed 06/28/18 1736 06/28/18 1838   06/28/18 1734  vancomycin (VANCOCIN) powder  Status:  Discontinued       As needed 06/28/18 1735 06/28/18 1838   06/26/18 1300  vancomycin (VANCOCIN) 1,250 mg in sodium chloride 0.9 % 250 mL IVPB  Status:  Discontinued     1,250 mg 166.7 mL/hr over 90 Minutes Intravenous Every 12 hours 06/26/18 1057 06/26/18 1136   06/26/18 1300  vancomycin (VANCOCIN) IVPB 1000 mg/200 mL  premix  Status:  Discontinued     1,000 mg 200 mL/hr over 60 Minutes Intravenous Every 12 hours 06/26/18 1136 06/29/18 1342   06/26/18 0000  vancomycin IVPB  Status:  Discontinued     1,000 mg Intravenous Every 12 hours 06/26/18 1616 06/29/18    06/24/18 1200  vancomycin (VANCOCIN) 1,500 mg in sodium chloride 0.9 % 500 mL IVPB  Status:  Discontinued     1,500 mg 250 mL/hr over 120 Minutes Intravenous Every 24 hours 06/23/18 1040 06/26/18 1057   06/24/18 1000  cefTRIAXone (ROCEPHIN) 2 g in sodium chloride 0.9 % 100 mL IVPB  Status:  Discontinued     2 g 200 mL/hr over 30 Minutes Intravenous Every 24 hours 06/23/18 0857 06/24/18 1433   06/23/18 1848  vancomycin (VANCOCIN) powder  Status:  Discontinued       As needed 06/23/18 1848 06/23/18 1956   06/23/18 1847  gentamicin (GARAMYCIN) injection  Status:  Discontinued       As needed 06/23/18 1847 06/23/18 1956   06/23/18 0900  vancomycin (VANCOCIN) 1,500 mg in sodium chloride 0.9 % 500 mL IVPB     1,500 mg 250 mL/hr over 120 Minutes Intravenous  Once 06/23/18 0855 06/24/18 0814   06/23/18 0900  cefTRIAXone (ROCEPHIN) 1 g in sodium chloride 0.9 % 100 mL IVPB     1 g 200 mL/hr over 30 Minutes Intravenous  Once 06/23/18 0857 06/24/18 0814   06/20/18 0900  cefTRIAXone (ROCEPHIN) 1 g in sodium chloride 0.9 % 100 mL IVPB  Status:  Discontinued     1 g 200 mL/hr over 30 Minutes Intravenous Every 24 hours 06/20/18 0824 06/23/18 0857      Infusions:  . sodium chloride 75 mL/hr at 07/01/18 1900  . methocarbamol (  ROBAXIN) IV    . vancomycin 750 mg (07/02/18 0142)    Scheduled Meds: . diphenhydrAMINE  25 mg Oral Once  . docusate sodium  100 mg Oral BID  . enoxaparin (LOVENOX) injection  40 mg Subcutaneous Daily  . esomeprazole  20 mg Oral QAC breakfast  . folic acid  1 mg Oral Daily  . methocarbamol  500 mg Oral TID  . multivitamin with minerals  1 tablet Oral Daily  . sodium chloride flush  10-40 mL Intracatheter Q12H  . sucralfate  1 g  Oral TID WC & HS  . thiamine  100 mg Oral Daily   Or  . thiamine  100 mg Intravenous Daily  . traMADol  50 mg Oral Q6H    PRN meds: acetaminophen **OR** acetaminophen, alum & mag hydroxide-simeth, menthol-cetylpyridinium **OR** phenol, methocarbamol **OR** methocarbamol (ROBAXIN) IV, metoCLOPramide **OR** metoCLOPramide (REGLAN) injection, morphine injection, ondansetron **OR** ondansetron (ZOFRAN) IV, polyethylene glycol, sodium chloride flush   Objective: Vitals:   07/02/18 0311 07/02/18 0724  BP: 138/88 136/73  Pulse: (!) 104 (!) 108  Resp: 18 16  Temp: 99.1 F (37.3 C) 98.4 F (36.9 C)  SpO2: 98% 98%    Intake/Output Summary (Last 24 hours) at 07/02/2018 0931 Last data filed at 07/02/2018 0845 Gross per 24 hour  Intake 2770.72 ml  Output 4700 ml  Net -1929.28 ml   Filed Weights   06/19/18 1303 06/23/18 1708 06/28/18 1543  Weight: 74.8 kg 74.8 kg 74.8 kg   Weight change:  Body mass index is 23.68 kg/m.   Physical Exam: General exam: Appears calm and comfortable.  Skin: No rashes, lesions or ulcers. HEENT: Atraumatic, normocephalic, supple neck, no obvious bleeding Lungs: Clear to auscultation bilaterally CVS: Regular rate and rhythm, no murmur GI/Abd soft, nondistended, nontender, pulse present CNS: Alert, awake x3, left lower extremity weakness continues. Psychiatry: Mood appropriate Extremities: No pedal edema, no calf tenderness.  Right shoulder improving swelling and cellulitis.  Data Review: I have personally reviewed the laboratory data and studies available.  Recent Labs  Lab 06/26/18 0530 06/27/18 0742 06/28/18 0317 06/29/18 0500  WBC 23.4* 19.5* 17.6* 17.3*  HGB 9.3* 8.8* 8.4* 8.0*  HCT 27.8* 26.6* 25.2* 24.0*  MCV 92.1 92.0 91.3 92.3  PLT 437* 486* 499* 488*   Recent Labs  Lab 06/26/18 0530 06/27/18 0742 06/28/18 0317 06/29/18 0500 07/02/18 0859  NA 128* 131* 129* 129* 131*  K 4.4 4.3 4.3 4.7 3.8  CL 103 97* 97* 96* 93*  CO2 22 24 26  25 28   GLUCOSE 108* 104* 101* 148* 140*  BUN 29* 24* 23* 26* 18  CREATININE 0.91 0.91 0.88 0.93 0.77  CALCIUM 7.9* 8.3* 8.1* 8.4* 8.5*    Lorin Glass, MD  [facility] 07/02/2018

## 2018-07-03 LAB — CULTURE, BLOOD (ROUTINE X 2)
Culture: NO GROWTH
Culture: NO GROWTH
Special Requests: ADEQUATE

## 2018-07-03 LAB — HCV RNA QUANT
HCV Quantitative Log: 4.246 log10 IU/mL (ref >1.70)
HCV Quantitative: 17600 IU/mL (ref >50)

## 2018-07-03 LAB — AEROBIC/ANAEROBIC CULTURE W GRAM STAIN (SURGICAL/DEEP WOUND)
Culture: NO GROWTH
Culture: NO GROWTH

## 2018-07-03 MED ORDER — POLYETHYLENE GLYCOL 3350 17 G PO PACK
17.0000 g | PACK | Freq: Every day | ORAL | Status: DC
  Administered 2018-07-03 – 2018-07-09 (×5): 17 g via ORAL
  Filled 2018-07-03 (×10): qty 1
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
## 2018-07-03 NOTE — Progress Notes (Signed)
PT Cancellation Note  Patient Details Name: [name] MRN: [id number] DOB: [birth year]   Cancelled Treatment:    Reason Eval/Treat Not Completed: Patient declined, no reason specified Attempted to see pt for therapy with PTA, OT, and therapy tech. Pt requests male therapist only because "I don't trust it" and "I don't think you can hold my weight". PT will continue to follow acutely.    Erline Levine, PTA Acute Rehabilitation Services Pager: [phone] Office: [phone]   07/03/2018, 3:10 PM

## 2018-07-03 NOTE — Progress Notes (Signed)
Occupational Therapy Treatment Patient Details Name: [name] MRN: [id number] DOB: 06/11/1965 Today's Date: 07/03/2018    History of present illness Pt. with PMH of hep C, substance abuse; admitted on 06/19/2018, presented with complaint of back pain, was found to have musculoskeletal back pain as well as right upper extremity cellulitis; Patient had a debridement of his shoulder perfromed on 5/20.    OT comments  Patient supine in bed and agreeable to OT session for R shoulder ROM.  Patient educated on precautions, exercises, sling mgmt, and positioning.  Patient provided handouts of exercises for PROM to AROM of R shoulder (movement precautions below), elbow/wrist/hand as tolerated- verbal order per Dr. August Saucerean on 5/25 with OK to move elbow/wrist/hand.  Noted elbow tightness, limiting elbow extension -50 degrees (see below for shoulder) and encouraged patient to continue exercises 3x/day with nursing assist. Patient agreeable to recommendations and thankful for education. Will follow.    Follow Up Recommendations  CIR;Supervision/Assistance - 24 hour(no insurance)    Equipment Recommendations  Other (comment)(TBD)    Recommendations for Other Services Rehab consult    Precautions / Restrictions Precautions Precautions: Shoulder Type of Shoulder Precautions: Active protocol: PROM/AROM shoulder FF to 90, abduct to 60, ER to 30; verbal order per Dr. August Saucerean on 5/25 AROM elbow/wrist/hand as tolerated  Shoulder Interventions: Shoulder sling/immobilizer;For comfort(and sleep) Required Braces or Orthoses: Sling Restrictions Weight Bearing Restrictions: Yes RUE Weight Bearing: Non weight bearing LLE Weight Bearing: Weight bearing as tolerated Other Position/Activity Restrictions: L LE WBAT       Mobility Bed Mobility                  Transfers                 General transfer comment: did not attempt    Balance                                            ADL either performed or assessed with clinical judgement   ADL                                         General ADL Comments: focus on R UE exercises today     Vision       Perception     Praxis      Cognition Arousal/Alertness: Awake/alert Behavior During Therapy: WFL for tasks assessed/performed;Anxious Overall Cognitive Status: Within Functional Limits for tasks assessed                                 General Comments: anxious thinking about mobilizing t oday         Exercises Exercises: Other exercises;General Upper Extremity General Exercises - Upper Extremity Shoulder Flexion: PROM;AROM;10 reps;Right;Supine Shoulder ABduction: PROM;AROM;10 reps;Right;Supine Elbow Flexion: AROM;Right;10 reps;Supine Elbow Extension: AROM;10 reps;Supine;Right Wrist Flexion: AROM;10 reps;Supine;Right Wrist Extension: AROM;10 reps;Supine;Right Digit Composite Flexion: AROM;10 reps;Supine;Right Composite Extension: AROM;10 reps;Supine;Right Shoulder Exercises Shoulder External Rotation: PROM;AROM;Right;10 reps;Supine   Shoulder Instructions Shoulder Instructions Donning/doffing sling/immobilizer: Minimal assistance Correct positioning of sling/immobilizer: Minimal assistance ROM for elbow, wrist and digits of operated UE: Set-up Sling wearing schedule (on at all times/off for ADL's): Set-up Positioning of UE while sleeping: Minimal assistance  General Comments      Pertinent Vitals/ Pain       Pain Assessment: Faces Faces Pain Scale: Hurts little more Pain Location: R shoulder/elbow with ROM  Pain Descriptors / Indicators: Sore;Grimacing;Guarding Pain Intervention(s): Limited activity within patient's tolerance;Repositioned  Home Living                                          Prior Functioning/Environment              Frequency  Min 3X/week        Progress Toward Goals  OT Goals(current goals can now be  found in the care plan section)  Progress towards OT goals: Progressing toward goals  Acute Rehab OT Goals Patient Stated Goal: to get out of this bed and move OT Goal Formulation: With patient  Plan Discharge plan remains appropriate;Frequency remains appropriate    Co-evaluation                 AM-PAC OT "6 Clicks" Daily Activity     Outcome Measure   Help from another person eating meals?: A Little Help from another person taking care of personal grooming?: A Little Help from another person toileting, which includes using toliet, bedpan, or urinal?: Total Help from another person bathing (including washing, rinsing, drying)?: Total Help from another person to put on and taking off regular upper body clothing?: Total Help from another person to put on and taking off regular lower body clothing?: Total 6 Click Score: 10    End of Session Equipment Utilized During Treatment: Other (comment)(sling )  OT Visit Diagnosis: Muscle weakness (generalized) (M62.81);Pain Pain - Right/Left: Right Pain - part of body: Shoulder(L knee)   Activity Tolerance Patient limited by pain   Patient Left in bed;with call bell/phone within reach;with bed alarm set   Nurse Communication Mobility status;Weight bearing status;Precautions(sling, positioning)        Time: 1829-9371 OT Time Calculation (min): 38 min  Charges: OT General Charges $OT Visit: 1 Visit OT Treatments $Therapeutic Exercise: 38-52 mins  Chancy Milroy, OT Acute Rehabilitation Services Pager [phone] Office [phone]    Chancy Milroy 07/03/2018, 4:45 PM

## 2018-07-04 LAB — CBC WITH DIFFERENTIAL/PLATELET
Abs Immature Granulocytes: 0.17 10*3/uL — ABNORMAL HIGH (ref 0.00–0.07)
Basophils Absolute: 0.1 10*3/uL (ref 0.0–0.1)
Basophils Relative: 1 %
Eosinophils Absolute: 0.1 10*3/uL (ref 0.0–0.5)
Eosinophils Relative: 1 %
HCT: 21.8 % — ABNORMAL LOW (ref 39.0–52.0)
Hemoglobin: 7.2 g/dL — ABNORMAL LOW (ref 13.0–17.0)
Immature Granulocytes: 2 %
Lymphocytes Relative: 17 %
Lymphs Abs: 1.6 10*3/uL (ref 0.7–4.0)
MCH: 30.1 pg (ref 26.0–34.0)
MCHC: 33 g/dL (ref 30.0–36.0)
MCV: 91.2 fL (ref 80.0–100.0)
Monocytes Absolute: 0.7 10*3/uL (ref 0.1–1.0)
Monocytes Relative: 7 %
Neutro Abs: 7.1 10*3/uL (ref 1.7–7.7)
Neutrophils Relative %: 72 %
Platelets: 423 10*3/uL — ABNORMAL HIGH (ref 150–400)
RBC: 2.39 MIL/uL — ABNORMAL LOW (ref 4.22–5.81)
RDW: 14.7 % (ref 11.5–15.5)
WBC: 9.8 10*3/uL (ref 4.0–10.5)
nRBC: 0 % (ref 0.0–0.2)

## 2018-07-04 LAB — BASIC METABOLIC PANEL
Anion gap: 10 (ref 5–15)
BUN: 25 mg/dL — ABNORMAL HIGH (ref 6–20)
CO2: 29 mmol/L (ref 22–32)
Calcium: 8.9 mg/dL (ref 8.9–10.3)
Chloride: 92 mmol/L — ABNORMAL LOW (ref 98–111)
Creatinine, Ser: 0.69 mg/dL (ref 0.61–1.24)
GFR calc Af Amer: >60 mL/min (ref >60)
GFR calc non Af Amer: >60 mL/min (ref >60)
Glucose, Bld: 107 mg/dL — ABNORMAL HIGH (ref 70–99)
Potassium: 3.8 mmol/L (ref 3.5–5.1)
Sodium: 131 mmol/L — ABNORMAL LOW (ref 135–145)

## 2018-07-04 NOTE — Progress Notes (Signed)
Occupational Therapy Treatment Patient Details Name: [name] MRN: [id number] DOB: May 18, 1965 Today's Date: 07/04/2018    History of present illness Pt. with PMH of hep C, substance abuse; admitted on 06/19/2018, presented with complaint of back pain, was found to have musculoskeletal back pain as well as right upper extremity cellulitis; Patient had a debridement of his shoulder perfromed on 5/20.    OT comments  Session focused on R UE exercises.  Improving AROM of R shoulder, FF to 30 degrees, abduction to 45 degrees and external rotation to 30 degrees; elbow extension improving (- only approx 45 degrees today) patient guiding ROM limits within pain tolerance.  Pt reports completing exercises since last visit.  Good recall on sling mgmt. Next session focus on ADLs and mobility. Will follow.    Follow Up Recommendations  CIR;Supervision/Assistance - 24 hour(no insurance)    Equipment Recommendations  Other (comment)(TBD)    Recommendations for Other Services Rehab consult    Precautions / Restrictions Precautions Precautions: Shoulder Type of Shoulder Precautions: Active protocol: PROM/AROM shoulder FF to 90, abduct to 60, ER to 30; verbal order per Dr. August Saucer on 5/25 AROM elbow/wrist/hand as tolerated  Shoulder Interventions: Shoulder sling/immobilizer;For comfort Required Braces or Orthoses: Sling Restrictions Weight Bearing Restrictions: Yes RUE Weight Bearing: Non weight bearing LLE Weight Bearing: Weight bearing as tolerated Other Position/Activity Restrictions: L LE WBAT       Mobility Bed Mobility Overal bed mobility: Needs Assistance Bed Mobility: Supine to Sit;Sit to Supine     Supine to sit: Max assist;+2 for physical assistance Sit to supine: +2 for physical assistance;Max assist   General bed mobility comments: Patient required max a to control left knee to the edge of the bed. He requested foot pbe put on a pillow at the edge of the bed. Therapy was able to  tprogress patient from lying on his elbow to sitting up straight with 2 pillows. He sat at the edge of the bed for 20 minutes while therapy went to another room. He was then able to transfer back to bed with max a and nursing assistance.   Transfers Overall transfer level: Needs assistance               General transfer comment: redfused to transfer despite education and cuing     Balance Overall balance assessment: Needs assistance Sitting-balance support: Single extremity supported Sitting balance-Leahy Scale: Poor Sitting balance - Comments: still leans hevily to the right                                    ADL either performed or assessed with clinical judgement   ADL                                         General ADL Comments: session focused on R UE exercises today      Vision       Perception     Praxis      Cognition Arousal/Alertness: Awake/alert Behavior During Therapy: [facility] for tasks assessed/performed;Anxious Overall Cognitive Status: Within Functional Limits for tasks assessed                                 General Comments: continues to have anxiety about moving  Exercises Exercises: General Upper Extremity;Shoulder Shoulder Exercises Shoulder Flexion: AROM;Right;10 reps;Supine(to 30 degrees) Shoulder ABduction: AROM;Right;10 reps;Supine(to 45 degrees) Shoulder External Rotation: AROM;Right;10 reps;Supine(to 30 degrees) Elbow Flexion: AROM;Right;10 reps;Supine(- approx 45 degrees extension) Elbow Extension: AROM;Right;10 reps;Supine Wrist Flexion: AROM;Right;10 reps;Supine Wrist Extension: AROM;Right;10 reps;Supine Digit Composite Flexion: AROM;Right;10 reps;Supine Composite Extension: AROM;Right;10 reps;Supine   Shoulder Instructions Shoulder Instructions Donning/doffing sling/immobilizer: Minimal assistance Correct positioning of sling/immobilizer: Minimal assistance ROM for elbow, wrist  and digits of operated UE: Set-up Sling wearing schedule (on at all times/off for ADL's): Set-up     General Comments      Pertinent Vitals/ Pain       Pain Assessment: Faces Faces Pain Scale: Hurts whole lot Pain Location: left knee  Pain Descriptors / Indicators: Sore;Grimacing;Guarding Pain Intervention(s): Monitored during session  Home Living                                          Prior Functioning/Environment              Frequency  Min 3X/week        Progress Toward Goals  OT Goals(current goals can now be found in the care plan section)  Progress towards OT goals: Progressing toward goals  Acute Rehab OT Goals Patient Stated Goal: to get out of this bed and move OT Goal Formulation: With patient  Plan Discharge plan remains appropriate;Frequency remains appropriate    Co-evaluation                 AM-PAC OT "6 Clicks" Daily Activity     Outcome Measure   Help from another person eating meals?: A Little Help from another person taking care of personal grooming?: A Little Help from another person toileting, which includes using toliet, bedpan, or urinal?: Total Help from another person bathing (including washing, rinsing, drying)?: Total Help from another person to put on and taking off regular upper body clothing?: Total Help from another person to put on and taking off regular lower body clothing?: Total 6 Click Score: 10    End of Session Equipment Utilized During Treatment: Other (comment)(R UE sling)  OT Visit Diagnosis: Muscle weakness (generalized) (M62.81);Pain Pain - Right/Left: Right Pain - part of body: Shoulder(L LE )   Activity Tolerance Patient tolerated treatment well   Patient Left in bed;with call bell/phone within reach   Nurse Communication Mobility status;Weight bearing status;Precautions        Time: [id number] OT Time Calculation (min): 12 min  Charges: OT General Charges $OT Visit: 1  Visit OT Treatments $Therapeutic Exercise: 8-22 mins  Chancy Milroyhristie S [place] Urieta, OT Acute Rehabilitation Services Pager [phone] Office [phone]    Chancy MilroyChristie S Kemond Amorin 07/04/2018, 4:50 PM

## 2018-07-04 NOTE — Progress Notes (Signed)
Physical Therapy Treatment Patient Details Name: Carollee MassedJames Imburgia MRN: [id number] DOB: 04/05/1965 Today's Date: 07/04/2018    History of Present Illness Pt. with PMH of hep C, substance abuse; admitted on 06/19/2018, presented with complaint of back pain, was found to have musculoskeletal back pain as well as right upper extremity cellulitis; Patient had a debridement of his shoulder perfromed on 5/20.     PT Comments     Patient was able to sit at the edge of the bed for about a half an hour. He is still very hesitant to trnasfer but he needs to get to the commode. Therapy explained to him how he could transfer tomorrow. He is motivated to try to get to the commode. He reported significant pain in his left knee with bed mobility. He was able to sit straighter today.   Follow Up Recommendations  SNF;CIR     Equipment Recommendations  Other (comment)    Recommendations for Other Services Rehab consult     Precautions / Restrictions Precautions Precautions: Shoulder Type of Shoulder Precautions: Active protocol: PROM/AROM shoulder FF to 90, abduct to 60, ER to 30; verbal order per Dr. August Saucerean on 5/25 AROM elbow/wrist/hand as tolerated  Shoulder Interventions: Shoulder sling/immobilizer;For comfort Required Braces or Orthoses: Sling Restrictions Weight Bearing Restrictions: Yes RUE Weight Bearing: Non weight bearing LLE Weight Bearing: Weight bearing as tolerated Other Position/Activity Restrictions: L LE WBAT    Mobility  Bed Mobility Overal bed mobility: Needs Assistance Bed Mobility: Supine to Sit;Sit to Supine     Supine to sit: Max assist;+2 for physical assistance Sit to supine: +2 for physical assistance;Max assist   General bed mobility comments: Patient required max a to control left knee to the edge of the bed. He requested foot pbe put on a pillow at the edge of the bed. Therapy was able to tprogress patient from lying on his elbow to sitting up straight with 2 pillows. He  sat at the edge of the bed for 20 minutes while therapy went to another room. He was then able to transfer back to bed with max a and nursing assistance.   Transfers Overall transfer level: Needs assistance               General transfer comment: redfused to transfer despite education and cuing   Ambulation/Gait                 Stairs             Wheelchair Mobility    Modified Rankin (Stroke Patients Only)       Balance Overall balance assessment: Needs assistance Sitting-balance support: Single extremity supported Sitting balance-Leahy Scale: Poor Sitting balance - Comments: still leans hevily to the right                                     Cognition Arousal/Alertness: Awake/alert Behavior During Therapy: WFL for tasks assessed/performed;Anxious Overall Cognitive Status: Within Functional Limits for tasks assessed                                 General Comments: continues to have anxiety about moving       Exercises      General Comments        Pertinent Vitals/Pain Pain Assessment: Faces Faces Pain Scale: Hurts whole lot Pain Location: left knee  Pain Descriptors / Indicators: Sore;Grimacing;Guarding Pain Intervention(s): Limited activity within patient's tolerance;Monitored during session;Repositioned    Home Living                      Prior Function            PT Goals (current goals can now be found in the care plan section) Acute Rehab PT Goals Patient Stated Goal: to get out of this bed and move PT Goal Formulation: With patient Time For Goal Achievement: 07/05/18 Potential to Achieve Goals: Good    Frequency    Min 3X/week      PT Plan Current plan remains appropriate    Co-evaluation              AM-PAC PT "6 Clicks" Mobility   Outcome Measure  Help needed turning from your back to your side while in a flat bed without using bedrails?: A Lot Help needed moving from  lying on your back to sitting on the side of a flat bed without using bedrails?: A Lot Help needed moving to and from a bed to a chair (including a wheelchair)?: Total Help needed standing up from a chair using your arms (e.g., wheelchair or bedside chair)?: Total Help needed to walk in hospital room?: Total Help needed climbing 3-5 steps with a railing? : Total 6 Click Score: 8    End of Session Equipment Utilized During Treatment: Gait belt;Other (comment) Activity Tolerance: Patient limited by pain Patient left: in bed;with call bell/phone within reach;with bed alarm set;with SCD's reapplied Nurse Communication: Mobility status PT Visit Diagnosis: Unsteadiness on feet (R26.81);Muscle weakness (generalized) (M62.81);Pain;Other abnormalities of gait and mobility (R26.89) Pain - Right/Left: Right Pain - part of body: Arm     Time: 1410-1435 PT Time Calculation (min) (ACUTE ONLY): 25 min  Charges:  $Therapeutic Activity: 23-37 mins                      Dessie Coma PT DPT  07/04/2018, 3:59 PM
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
## 2018-07-04 NOTE — Progress Notes (Signed)
Pharmacy Antibiotic Note  [name] is a 53 y.o. male admitted on 06/19/2018 with back pain, now with septic arthritis of multiple joints. Pharmacy has been consulted for vancomycin dosing. Patient is s/p I&D of R shoulder and the [facility] joint on 5/20.  Vancomycin levels demonstrate therapeutic AUC of 445 mcg*h/ml, Cr has remained stable, planning for 6 weeks of ABX per ID. OPAT has been entered, no changes for now.   Plan: -Continue vancomycin 750mg  IV q12h -Monitor renal function, vancomycin levels as needed  Height: 5\' 10"  (177.8 cm) Weight: 165 lb (74.8 kg) IBW/kg (Calculated) : 73  Temp (24hrs), Avg:98.4 F (36.9 C), Min:97.9 F (36.6 C), Max:99.5 F (37.5 C)  Recent Labs  Lab 06/28/18 0317  06/29/18 0500 06/29/18 0700 06/29/18 1237 07/01/18 1100 07/01/18 1502 07/02/18 0859 07/04/18 0140  WBC 17.6*  --  17.3*  --   --   --   --   --  9.8  CREATININE 0.88  --  0.93  --   --   --   --  0.77 0.69  VANCOTROUGH  --    < >  --   --  19  --  12*  --   --   VANCOPEAK  --   --   --  24*  --  16*  --   --   --    < > = values in this interval not displayed.    Estimated Creatinine Clearance: 110.3 mL/min (by C-G formula based on SCr of 0.69 mg/dL).    No Known Allergies   Vanc 5/15 >> CTX 5/12 >> 5/16  5/11 covid - negative 5/12 BCx - negative 5/14 synovial fluid L knee - negative 5/15 synovial fluid L knee - GPC on stain 5/20 BCx: ng x 1 day 5/20 abscess shoulder posterior - few GPC on stain 5/20 AC joint fluid:   Thank you for allowing pharmacy to be a part of this patient's care.   Zendayah Hardgrave A. Jeanella Craze, PharmD, BCPS Clinical Pharmacist [place] Please utilize Amion for appropriate phone number to reach the unit pharmacist [facility])   07/04/2018

## 2018-07-05 DIAGNOSIS — D649 Anemia, unspecified: Secondary | ICD-10-CM

## 2018-07-05 LAB — CBC
HCT: 23.1 % — ABNORMAL LOW (ref 39.0–52.0)
Hemoglobin: 7.5 g/dL — ABNORMAL LOW (ref 13.0–17.0)
MCH: 29.5 pg (ref 26.0–34.0)
MCHC: 32.5 g/dL (ref 30.0–36.0)
MCV: 90.9 fL (ref 80.0–100.0)
Platelets: 469 10*3/uL — ABNORMAL HIGH (ref 150–400)
RBC: 2.54 MIL/uL — ABNORMAL LOW (ref 4.22–5.81)
RDW: 15 % (ref 11.5–15.5)
WBC: 7.7 10*3/uL (ref 4.0–10.5)
nRBC: 0 % (ref 0.0–0.2)

## 2018-07-05 NOTE — Progress Notes (Signed)
PROGRESS NOTE    [name]  AOZ:[id number] DOB: [birth year] DOA: 06/19/2018 PCP: Patient, No Pcp Per   Brief Narrative:  HPI on 06/19/2018 by Dr. Kennis Carina [name] is a 53 y.o. male with medical history significant for hepatitis C. Hx is obtained mostly from chart review as at the time of my evaluation patient is s/p Dilaudid and Ativan and unable to give me details of history, except to tell me he hurts all over and did not fall.  Patient presented to the ED with complaints of increasing back pain, radiating down his left leg, pain now going down both legs.  Patient has difficulty standing, or walking without assistance.  Pain started about after patient attempted to lift a lawnmower over a week ago.  Patient came to the ED May 5th, MRI showed severe foraminal narrowing at L5-S1, due to anterolisthesis and disc appeared worse on left.  Possible to follow-up with neurosurgery as outpatient, but he said he did not understand the instructions.  Patient was discharged on prednisone and Percocets. Patient denied vomiting and loose stools.  DAughter called that patient history of street drug use-cocaine.  Last use 2 to 3 weeks ago.  Interim history Was admitted for sepsis secondary to right upper extremity cellulitis and was started on IV antibiotics however cellulitis did not improve.  He did have CT scan which showed intrafascial abscesses in the right shoulder along with septic arthritis of the [facility] joint.  Patient had debridement of multiple abscesses.  During hospitalization patient began to have back symptoms and lower extremities weakness.  Patient did have MRI of the lumbar spine which did show discitis as well as septic arthritis and paraspinal abscesses.  During his hospitalization, he did have left knee swelling he did have arthrocentesis which showed gram-positive cocci and had left knee washout.  Patient does require 6 to 8 weeks of IV antibiotics per infectious disease.  Assessment & Plan   Sepsis secondary to cellulitis/right upper extremity abscesses/multiple lumbar abscesses and septic arthritis of the left knee -Presented with tachycardia and leukocytosis, back pain -MRI 5 5 showed severe lumbar spinal stenosis.  MRI 519 showed L4-S1 discitis, L4-L5 septic arthritis/multiple paraspinal abscesses -Patient was also noted to have left knee edema -CT right shoulder showed full intramuscular and intrafascial abscesses along with septic arthritis of the [facility] joint -Neurosurgery consulted and appreciated-commended MRI near the end of antibiotic regimen -Infectious disease consulted and appreciated, recommended IV antibiotics minimum of 6 weeks  -Orthopedic surgery consulted and appreciated -Status post left knee arthrocentesis and wash -Patient was noted to have gram-positive cocci in fluid culture -Echocardiogram unremarkable for vegetation -Continue pain control -CT consulted recommending SNF versus a CIR  Acute kidney injury -Creatinine was 3.5 on admission -Resolved, creatinine currently 0.69 -Monitor BMP  Normocytic anemia -Continue to monitor CBC, transfuse if hemoglobin drops below 7  Hyponatremia -Improved, continue to monitor BMP  Constipation -Continue bowel regimen  Polysubstance abuse -Admits to using cocaine prior to admission.  Refuses he is ever used IV heroin -UDS positive for benzodiazepines and cocaine  DVT Prophylaxis  lovenox  Code Status: Full  Family Communication: None at bedside  Disposition Plan: Admitted. Pending completion of IV antibiotics   Consultants Neurosurgery Infectious disease Orthopedic surgery  Procedures  -5/14- left kneearthrocentesiswas done, synovial fluidgrew gram-positive cocci. -5/15 -left knee washout. -5/20-R shoulder debridement of multiple abscesses and fluid in [facility] joint.  Antibiotics   Anti-infectives (From admission, onward)   Start     Dose/Rate  Route Frequency Ordered Stop    06/29/18 1400  vancomycin (VANCOCIN) IVPB 750 mg/150 ml premix     750 mg 150 mL/hr over 60 Minutes Intravenous Every 12 hours 06/29/18 1342     06/29/18 0000  vancomycin IVPB     750 mg Intravenous Every 12 hours 06/29/18 1407 08/09/18 2359   06/28/18 1735  gentamicin (GARAMYCIN) injection  Status:  Discontinued       As needed 06/28/18 1736 06/28/18 1838   06/28/18 1734  vancomycin (VANCOCIN) powder  Status:  Discontinued       As needed 06/28/18 1735 06/28/18 1838   06/26/18 1300  vancomycin (VANCOCIN) 1,250 mg in sodium chloride 0.9 % 250 mL IVPB  Status:  Discontinued     1,250 mg 166.7 mL/hr over 90 Minutes Intravenous Every 12 hours 06/26/18 1057 06/26/18 1136   06/26/18 1300  vancomycin (VANCOCIN) IVPB 1000 mg/200 mL premix  Status:  Discontinued     1,000 mg 200 mL/hr over 60 Minutes Intravenous Every 12 hours 06/26/18 1136 06/29/18 1342   06/26/18 0000  vancomycin IVPB  Status:  Discontinued     1,000 mg Intravenous Every 12 hours 06/26/18 1616 06/29/18    06/24/18 1200  vancomycin (VANCOCIN) 1,500 mg in sodium chloride 0.9 % 500 mL IVPB  Status:  Discontinued     1,500 mg 250 mL/hr over 120 Minutes Intravenous Every 24 hours 06/23/18 1040 06/26/18 1057   06/24/18 1000  cefTRIAXone (ROCEPHIN) 2 g in sodium chloride 0.9 % 100 mL IVPB  Status:  Discontinued     2 g 200 mL/hr over 30 Minutes Intravenous Every 24 hours 06/23/18 0857 06/24/18 1433   06/23/18 1848  vancomycin (VANCOCIN) powder  Status:  Discontinued       As needed 06/23/18 1848 06/23/18 1956   06/23/18 1847  gentamicin (GARAMYCIN) injection  Status:  Discontinued       As needed 06/23/18 1847 06/23/18 1956   06/23/18 0900  vancomycin (VANCOCIN) 1,500 mg in sodium chloride 0.9 % 500 mL IVPB     1,500 mg 250 mL/hr over 120 Minutes Intravenous  Once 06/23/18 0855 06/24/18 0814   06/23/18 0900  cefTRIAXone (ROCEPHIN) 1 g in sodium chloride 0.9 % 100 mL IVPB     1 g 200 mL/hr over 30 Minutes Intravenous  Once  06/23/18 0857 06/24/18 0814   06/20/18 0900  cefTRIAXone (ROCEPHIN) 1 g in sodium chloride 0.9 % 100 mL IVPB  Status:  Discontinued     1 g 200 mL/hr over 30 Minutes Intravenous Every 24 hours 06/20/18 0824 06/23/18 0857      Subjective:   [name] seen and examined today.  Feeling staff but states that it improves as the day progresses.  Denies current chest pain, shortness of breath, abdominal pain, nausea or vomiting,dizziness or headache.  Objective:   Vitals:   07/04/18 2322 07/05/18 0305 07/05/18 0818 07/05/18 1130  BP: 134/73 123/80 139/79 129/79  Pulse: (!) 105 (!) 108 (!) 102 (!) 105  Resp: 17 17 18 18   Temp: 98.3 F (36.8 C) 98.3 F (36.8 C) 97.7 F (36.5 C) 98 F (36.7 C)  TempSrc: Oral Oral Oral Oral  SpO2: 97% 97% 99% 97%  Weight:      Height:        Intake/Output Summary (Last 24 hours) at 07/05/2018 1335 Last data filed at 07/05/2018 0900 Gross per 24 hour  Intake 250 ml  Output 2375 ml  Net -2125 ml   Filed  Weights   06/19/18 1303 06/23/18 1708 06/28/18 1543  Weight: 74.8 kg 74.8 kg 74.8 kg    Exam  General: Well developed, well nourished, NAD, appears stated age  HEENT: NCAT, mucous membranes moist.   Neck: Supple  Cardiovascular: S1 S2 auscultated, no rubs, murmurs or gallops. Regular rate and rhythm.  Respiratory: Clear to auscultation bilaterally with equal chest rise  Abdomen: Soft, nontender, nondistended, + bowel sounds  Extremities: warm dry without cyanosis clubbing or edema. RUE in sling, with ressing in place on right shoulder. L knee edema  Neuro: AAOx3, nonfocal  Psych: Normal affect and demeanor    Data Reviewed: I have personally reviewed following labs and imaging studies  CBC: Recent Labs  Lab 06/29/18 0500 07/04/18 0140 07/05/18 0822  WBC 17.3* 9.8 7.7  NEUTROABS  --  7.1  --   HGB 8.0* 7.2* 7.5*  HCT 24.0* 21.8* 23.1*  MCV 92.3 91.2 90.9  PLT 488* 423* 469*   Basic Metabolic Panel: Recent Labs  Lab  06/29/18 0500 07/02/18 0859 07/04/18 0140  NA 129* 131* 131*  K 4.7 3.8 3.8  CL 96* 93* 92*  CO2 GLUCOSE 148* 140* 107*  BUN 26* 18 25*  CREATININE 0.93 0.77 0.69  CALCIUM 8.4* 8.5* 8.9   GFR: Estimated Creatinine Clearance: 110.3 mL/min (by C-G formula based on SCr of 0.69 mg/dL). Liver Function Tests: No results for input(s): AST, ALT, ALKPHOS, BILITOT, PROT, ALBUMIN in the last 168 hours. No results for input(s): LIPASE, AMYLASE in the last 168 hours. No results for input(s): AMMONIA in the last 168 hours. Coagulation Profile: No results for input(s): INR, PROTIME in the last 168 hours. Cardiac Enzymes: No results for input(s): CKTOTAL, CKMB, CKMBINDEX, TROPONINI in the last 168 hours. BNP (last 3 results) No results for input(s): PROBNP in the last 8760 hours. HbA1C: No results for input(s): HGBA1C in the last 72 hours. CBG: No results for input(s): GLUCAP in the last 168 hours. Lipid Profile: No results for input(s): CHOL, HDL, LDLCALC, TRIG, CHOLHDL, LDLDIRECT in the last 72 hours. Thyroid Function Tests: No results for input(s): TSH, T4TOTAL, FREET4, T3FREE, THYROIDAB in the last 72 hours. Anemia Panel: No results for input(s): VITAMINB12, FOLATE, FERRITIN, TIBC, IRON, RETICCTPCT in the last 72 hours. Urine analysis:    Component Value Date/Time   COLORURINE YELLOW 06/19/2018 1944   APPEARANCEUR HAZY (A) 06/19/2018 1944   LABSPEC 1.014 06/19/2018 1944   PHURINE 5.0 06/19/2018 1944   GLUCOSEU 50 (A) 06/19/2018 1944   HGBUR MODERATE (A) 06/19/2018 1944   BILIRUBINUR NEGATIVE 06/19/2018 1944   KETONESUR NEGATIVE 06/19/2018 1944   PROTEINUR NEGATIVE 06/19/2018 1944   NITRITE NEGATIVE 06/19/2018 1944   LEUKOCYTESUR NEGATIVE 06/19/2018 1944   Sepsis Labs: (procalcitonin:4,lacticidven:4)  ) Recent Results (from the past 240 hour(s))  Culture, blood (routine x 2)     Status: None   Collection Time: 06/28/18 11:15 AM  Result Value Ref Range  Status   Specimen Description BLOOD RIGHT ANTECUBITAL  Final   Special Requests   Final    BOTTLES DRAWN AEROBIC ONLY Blood Culture adequate volume   Culture   Final    NO GROWTH 5 DAYS Performed at [facility], [address] [place], [place] [address]    Report Status 07/03/2018 FINAL  Final  Culture, blood (routine x 2)     Status: None   Collection Time: 06/28/18 11:20 AM  Result Value Ref Range Status   Specimen Description  BLOOD RIGHT HAND  Final   Special Requests   Final    BOTTLES DRAWN AEROBIC ONLY Blood Culture results may not be optimal due to an inadequate volume of blood received in culture bottles   Culture   Final    NO GROWTH 5 DAYS Performed at [facility], [address] [place], [place] [address]    Report Status 07/03/2018 FINAL  Final  Aerobic/Anaerobic Culture (surgical/deep wound)     Status: None   Collection Time: 06/28/18  5:27 PM  Result Value Ref Range Status   Specimen Description ABSCESS SHOULDER POSTERIOR  Final   Special Requests PATIENT ON FOLLOWING VANC  Final   Gram Stain   Final    FEW WBC PRESENT, PREDOMINANTLY PMN FEW GRAM POSITIVE COCCI    Culture   Final    No growth aerobically or anaerobically. Performed at [facility], [address] [place], [place] [address]    Report Status 07/03/2018 FINAL  Final  Aerobic/Anaerobic Culture (surgical/deep wound)     Status: None   Collection Time: 06/28/18  6:06 PM  Result Value Ref Range Status   Specimen Description JOINT FLUID AC  Final   Special Requests PATIENT ON FOLLOWING VANC FLUID ON SWABS  Final   Gram Stain   Final    FEW WBC PRESENT, PREDOMINANTLY PMN NO ORGANISMS SEEN    Culture   Final    No growth aerobically or anaerobically. Performed at [facility], [address] [place], [place] [address]    Report Status 07/03/2018 FINAL  Final      Radiology Studies: No results found.   Scheduled Meds: . diphenhydrAMINE  25 mg Oral Once  .  enoxaparin (LOVENOX) injection  40 mg Subcutaneous Daily  . esomeprazole  20 mg Oral QAC breakfast  . folic acid  1 mg Oral Daily  . methocarbamol  500 mg Oral TID  . multivitamin with minerals  1 tablet Oral Daily  . polyethylene glycol  17 g Oral Daily  . senna-docusate  1 tablet Oral BID  . sodium chloride flush  10-40 mL Intracatheter Q12H  . sucralfate  1 g Oral TID WC & HS  . thiamine  100 mg Oral Daily   Or  . thiamine  100 mg Intravenous Daily  . traMADol  50 mg Oral Q6H   Continuous Infusions: . sodium chloride 10 mL/hr at 07/02/18 1800  . methocarbamol (ROBAXIN) IV    . vancomycin 750 mg (07/05/18 0150)     LOS: 15 days   Time Spent in minutes   30 minutes  Kimberlea Schlag D.O. on 07/05/2018 at 1:35 PM  Between 7am to 7pm - Please see pager noted on [email or web address]  After 7pm go to [email or web address]  And look for the night coverage person covering for me after hours  [facility] Office  [phone]

## 2018-07-05 NOTE — Progress Notes (Signed)
Physical Therapy Treatment Patient Details Name: Clifford MassedJames Chan MRN: [id number] DOB: 05/04/1965 Today's Date: 07/05/2018    History of Present Illness Pt. with PMH of hep C, substance abuse; admitted on 06/19/2018, presented with complaint of back pain, was found to have musculoskeletal back pain as well as right upper extremity cellulitis; Patient had a debridement of his shoulder perfromed on 5/20.     PT Comments    Patient was able to sit up straighter today but became syncopal. He was motivated to transfer but was unable to do so. He continues to have significant pain in his knee. He is alos having difficulty sitting up straight.  He would benefit from further skilled therapy to improve his ability to transfer.   Follow Up Recommendations  SNF;CIR     Equipment Recommendations  Other (comment)    Recommendations for Other Services Rehab consult     Precautions / Restrictions Precautions Precautions: Shoulder Type of Shoulder Precautions: Active protocol: PROM/AROM shoulder FF to 90, abduct to 60, ER to 30; verbal order per Dr. August Saucerean on 5/25 AROM elbow/wrist/hand as tolerated  Shoulder Interventions: Shoulder sling/immobilizer;For comfort Restrictions Weight Bearing Restrictions: Yes RUE Weight Bearing: Non weight bearing LLE Weight Bearing: Weight bearing as tolerated    Mobility  Bed Mobility Overal bed mobility: Needs Assistance Bed Mobility: Supine to Sit;Sit to Supine Rolling: Max assist   Supine to sit: Max assist;+2 for physical assistance Sit to supine: +2 for physical assistance;Max assist   General bed mobility comments: Continues to require max +2 to get to the edge of the bed. Once to the edge he required time to come fully up to sitting. He was able to sit straight and get his right knee underneath him. Therapy was about to transfer him to the commode when he began to report increasing syncope. He was agiven a minute to see if it would decrease but he reported it  gettingworse. He was moved back to supine.   Transfers                 General transfer comment: unable to transfer to the commode  Ambulation/Gait                 Stairs             Wheelchair Mobility    Modified Rankin (Stroke Patients Only)       Balance Overall balance assessment: Needs assistance Sitting-balance support: Single extremity supported Sitting balance-Leahy Scale: Poor Sitting balance - Comments: still leans to the right                                     Cognition Arousal/Alertness: Awake/alert Behavior During Therapy: WFL for tasks assessed/performed;Anxious Overall Cognitive Status: Within Functional Limits for tasks assessed                                 General Comments: some continued anxiety over movement       Exercises      General Comments        Pertinent Vitals/Pain Pain Assessment: Faces Faces Pain Scale: Hurts whole lot Pain Location: left knee  Pain Descriptors / Indicators: Sore;Grimacing;Guarding Pain Intervention(s): Limited activity within patient's tolerance;Monitored during session;Premedicated before session    Home Living  Prior Function            PT Goals (current goals can now be found in the care plan section) Acute Rehab PT Goals Patient Stated Goal: To get to the commode  PT Goal Formulation: With patient Time For Goal Achievement: 07/05/18 Potential to Achieve Goals: Good Progress towards PT goals: Progressing toward goals    Frequency    Min 3X/week      PT Plan Current plan remains appropriate    Co-evaluation              AM-PAC PT "6 Clicks" Mobility   Outcome Measure  Help needed turning from your back to your side while in a flat bed without using bedrails?: A Lot Help needed moving from lying on your back to sitting on the side of a flat bed without using bedrails?: A Lot Help needed moving to and  from a bed to a chair (including a wheelchair)?: Total Help needed standing up from a chair using your arms (e.g., wheelchair or bedside chair)?: Total Help needed to walk in hospital room?: Total Help needed climbing 3-5 steps with a railing? : Total 6 Click Score: 8    End of Session Equipment Utilized During Treatment: Gait belt;Other (comment) Activity Tolerance: Patient limited by pain Patient left: in bed;with call bell/phone within reach;with bed alarm set;with SCD's reapplied Nurse Communication: Mobility status PT Visit Diagnosis: Unsteadiness on feet (R26.81);Muscle weakness (generalized) (M62.81);Pain;Other abnormalities of gait and mobility (R26.89) Pain - Right/Left: Right Pain - part of body: Arm     Time: 3606-7703 PT Time Calculation (min) (ACUTE ONLY): 28 min  Charges:  $Therapeutic Activity: 23-37 mins                        Dessie Coma PT DPT  07/05/2018, 12:31 PM

## 2018-07-05 NOTE — Progress Notes (Signed)
Pt returned to unit from CT.

## 2018-07-05 NOTE — Progress Notes (Signed)
Inpatient Rehabilitation Admissions Coordinator  Patient not a candidate for an inpt rehab admit at this time. To remain in house to receive his antibiotic therapy.  Ottie Glazier, RN, MSN Rehab Admissions Coordinator [phone] 07/05/2018 3:07 PM

## 2018-07-05 NOTE — Progress Notes (Signed)
Plan continues to remain in hospital for IV abx therapy. CM following.

## 2018-07-06 ENCOUNTER — Inpatient Hospital Stay: Payer: Self-pay | Admitting: Infectious Diseases

## 2018-07-06 LAB — CBC
HCT: 24.8 % — ABNORMAL LOW (ref 39.0–52.0)
Hemoglobin: 8 g/dL — ABNORMAL LOW (ref 13.0–17.0)
MCH: 29.3 pg (ref 26.0–34.0)
MCHC: 32.3 g/dL (ref 30.0–36.0)
MCV: 90.8 fL (ref 80.0–100.0)
Platelets: 553 10*3/uL — ABNORMAL HIGH (ref 150–400)
RBC: 2.73 MIL/uL — ABNORMAL LOW (ref 4.22–5.81)
RDW: 15.3 % (ref 11.5–15.5)
WBC: 7.7 10*3/uL (ref 4.0–10.5)
nRBC: 0 % (ref 0.0–0.2)

## 2018-07-06 LAB — BASIC METABOLIC PANEL
Anion gap: 9 (ref 5–15)
BUN: 22 mg/dL — ABNORMAL HIGH (ref 6–20)
CO2: 29 mmol/L (ref 22–32)
Calcium: 9.3 mg/dL (ref 8.9–10.3)
Chloride: 94 mmol/L — ABNORMAL LOW (ref 98–111)
Creatinine, Ser: 0.75 mg/dL (ref 0.61–1.24)
GFR calc Af Amer: >60 mL/min (ref >60)
GFR calc non Af Amer: >60 mL/min (ref >60)
Glucose, Bld: 110 mg/dL — ABNORMAL HIGH (ref 70–99)
Potassium: 3.6 mmol/L (ref 3.5–5.1)
Sodium: 132 mmol/L — ABNORMAL LOW (ref 135–145)

## 2018-07-06 NOTE — Progress Notes (Signed)
PROGRESS NOTE    [name]  PBD:[id number] DOB: [birth year] DOA: 06/19/2018 PCP: Patient, No Pcp Per   Brief Narrative:  HPI on 06/19/2018 by Dr. Kennis Carina Ein Budz is a 53 y.o. male with medical history significant for hepatitis C. Hx is obtained mostly from chart review as at the time of my evaluation patient is s/p Dilaudid and Ativan and unable to give me details of history, except to tell me he hurts all over and did not fall.  Patient presented to the ED with complaints of increasing back pain, radiating down his left leg, pain now going down both legs.  Patient has difficulty standing, or walking without assistance.  Pain started about after patient attempted to lift a lawnmower over a week ago.  Patient came to the ED May 5th, MRI showed severe foraminal narrowing at L5-S1, due to anterolisthesis and disc appeared worse on left.  Possible to follow-up with neurosurgery as outpatient, but he said he did not understand the instructions.  Patient was discharged on prednisone and Percocets. Patient denied vomiting and loose stools.  DAughter called that patient history of street drug use-cocaine.  Last use 2 to 3 weeks ago.  Interim history Was admitted for sepsis secondary to right upper extremity cellulitis and was started on IV antibiotics however cellulitis did not improve.  He did have CT scan which showed intrafascial abscesses in the right shoulder along with septic arthritis of the [facility] joint.  Patient had debridement of multiple abscesses.  During hospitalization patient began to have back symptoms and lower extremities weakness.  Patient did have MRI of the lumbar spine which did show discitis as well as septic arthritis and paraspinal abscesses.  During his hospitalization, he did have left knee swelling he did have arthrocentesis which showed gram-positive cocci and had left knee washout.  Patient does require 6 to 8 weeks of IV antibiotics per infectious disease.  Assessment & Plan   Sepsis secondary to cellulitis/right upper extremity abscesses/multiple lumbar abscesses and septic arthritis of the left knee -Presented with tachycardia and leukocytosis, back pain -MRI 5 5 showed severe lumbar spinal stenosis.  MRI 519 showed L4-S1 discitis, L4-L5 septic arthritis/multiple paraspinal abscesses -Patient was also noted to have left knee edema -CT right shoulder showed full intramuscular and intrafascial abscesses along with septic arthritis of the [facility] joint -Neurosurgery consulted and appreciated-commended MRI near the end of antibiotic regimen -Infectious disease consulted and appreciated, recommended IV antibiotics minimum of 6 weeks  -Orthopedic surgery consulted and appreciated -Status post left knee arthrocentesis and wash -Patient was noted to have gram-positive cocci in fluid culture -Echocardiogram unremarkable for vegetation -Continue pain control -PT consulted recommending SNF versus a CIR (however patient not a candidate for inpt rehab at this time)  Acute kidney injury -Creatinine was 3.5 on admission -Resolved, creatinine currently 0.75 -Monitor BMP  Normocytic anemia -hemoglobin currently 8 -Continue to monitor CBC, transfuse if hemoglobin drops below 7  Hyponatremia -Improved, Na currently 132 -continue to monitor BMP  Constipation -Continue bowel regimen  Polysubstance abuse -Admits to using cocaine prior to admission.  Refuses he is ever used IV heroin -UDS positive for benzodiazepines and cocaine  DVT Prophylaxis  lovenox  Code Status: Full  Family Communication: None at bedside  Disposition Plan: Admitted. Pending completion of IV antibiotics   Consultants Neurosurgery Infectious disease Orthopedic surgery  Procedures  -5/14- left kneearthrocentesiswas done, synovial fluidgrew gram-positive cocci. -5/15 -left knee washout. -5/20-R shoulder debridement of multiple abscesses and fluid in [facility]  joint.   Antibiotics   Anti-infectives (From admission, onward)   Start     Dose/Rate Route Frequency Ordered Stop   06/29/18 1400  vancomycin (VANCOCIN) IVPB 750 mg/150 ml premix     750 mg 150 mL/hr over 60 Minutes Intravenous Every 12 hours 06/29/18 1342     06/29/18 0000  vancomycin IVPB     750 mg Intravenous Every 12 hours 06/29/18 1407 08/09/18 2359   06/28/18 1735  gentamicin (GARAMYCIN) injection  Status:  Discontinued       As needed 06/28/18 1736 06/28/18 1838   06/28/18 1734  vancomycin (VANCOCIN) powder  Status:  Discontinued       As needed 06/28/18 1735 06/28/18 1838   06/26/18 1300  vancomycin (VANCOCIN) 1,250 mg in sodium chloride 0.9 % 250 mL IVPB  Status:  Discontinued     1,250 mg 166.7 mL/hr over 90 Minutes Intravenous Every 12 hours 06/26/18 1057 06/26/18 1136   06/26/18 1300  vancomycin (VANCOCIN) IVPB 1000 mg/200 mL premix  Status:  Discontinued     1,000 mg 200 mL/hr over 60 Minutes Intravenous Every 12 hours 06/26/18 1136 06/29/18 1342   06/26/18 0000  vancomycin IVPB  Status:  Discontinued     1,000 mg Intravenous Every 12 hours 06/26/18 1616 06/29/18    06/24/18 1200  vancomycin (VANCOCIN) 1,500 mg in sodium chloride 0.9 % 500 mL IVPB  Status:  Discontinued     1,500 mg 250 mL/hr over 120 Minutes Intravenous Every 24 hours 06/23/18 1040 06/26/18 1057   06/24/18 1000  cefTRIAXone (ROCEPHIN) 2 g in sodium chloride 0.9 % 100 mL IVPB  Status:  Discontinued     2 g 200 mL/hr over 30 Minutes Intravenous Every 24 hours 06/23/18 0857 06/24/18 1433   06/23/18 1848  vancomycin (VANCOCIN) powder  Status:  Discontinued       As needed 06/23/18 1848 06/23/18 1956   06/23/18 1847  gentamicin (GARAMYCIN) injection  Status:  Discontinued       As needed 06/23/18 1847 06/23/18 1956   06/23/18 0900  vancomycin (VANCOCIN) 1,500 mg in sodium chloride 0.9 % 500 mL IVPB     1,500 mg 250 mL/hr over 120 Minutes Intravenous  Once 06/23/18 0855 06/24/18 0814   06/23/18 0900  cefTRIAXone  (ROCEPHIN) 1 g in sodium chloride 0.9 % 100 mL IVPB     1 g 200 mL/hr over 30 Minutes Intravenous  Once 06/23/18 0857 06/24/18 0814   06/20/18 0900  cefTRIAXone (ROCEPHIN) 1 g in sodium chloride 0.9 % 100 mL IVPB  Status:  Discontinued     1 g 200 mL/hr over 30 Minutes Intravenous Every 24 hours 06/20/18 0824 06/23/18 0857      Subjective:   [place] seen and examined today.  Was not able to sleep well last night. Denies current chest pain, shortness of breath, abdominal pain, N/V/D/C, headache, dizziness.  Objective:   Vitals:   07/05/18 1935 07/06/18 0051 07/06/18 0438 07/06/18 0733  BP: 138/83 134/80 (!) 142/81 (!) 141/77  Pulse: (!) 102 97 (!) 103 (!) 105  Resp: Temp: 98.2 F (36.8 C) 98.1 F (36.7 C) 98.2 F (36.8 C) 98.2 F (36.8 C)  TempSrc: Oral Oral Oral Oral  SpO2: 98% 98% 99% 100%  Weight:      Height:        Intake/Output Summary (Last 24 hours) at 07/06/2018 0954 Last data filed at 07/05/2018 1633 Gross per 24 hour  Intake 320  ml  Output 1100 ml  Net -780 ml   Filed Weights   06/19/18 1303 06/23/18 1708 06/28/18 1543  Weight: 74.8 kg 74.8 kg 74.8 kg   Exam  General: Well developed, well nourished, NAD, appears stated age  HEENT: NCAT, mucous membranes moist.   Neck: Supple  Cardiovascular: S1 S2 auscultated,RRR  Respiratory: Clear to auscultation bilaterally with equal chest rise  Abdomen: Soft, nontender, nondistended, + bowel sounds  Extremities: warm dry without cyanosis clubbing or edema. RUE in sling with dressing in place. L knee edema (stable)  Neuro: AAOx3, nonfocal  Psych: Appropriate mood and affect  Data Reviewed: I have personally reviewed following labs and imaging studies  CBC: Recent Labs  Lab 07/04/18 0140 07/05/18 0822 07/06/18 0351  WBC 9.8 7.7 7.7  NEUTROABS 7.1  --   --   HGB 7.2* 7.5* 8.0*  HCT 21.8* 23.1* 24.8*  MCV 91.2 90.9 90.8  PLT 423* 469* 553*   Basic Metabolic Panel: Recent Labs   Lab 07/02/18 0859 07/04/18 0140 07/06/18 0351  NA 131* 131* 132*  K 3.8 3.8 3.6  CL 93* 92* 94*  CO2 GLUCOSE 140* 107* 110*  BUN 18 25* 22*  CREATININE 0.77 0.69 0.75  CALCIUM 8.5* 8.9 9.3   GFR: Estimated Creatinine Clearance: 110.3 mL/min (by C-G formula based on SCr of 0.75 mg/dL). Liver Function Tests: No results for input(s): AST, ALT, ALKPHOS, BILITOT, PROT, ALBUMIN in the last 168 hours. No results for input(s): LIPASE, AMYLASE in the last 168 hours. No results for input(s): AMMONIA in the last 168 hours. Coagulation Profile: No results for input(s): INR, PROTIME in the last 168 hours. Cardiac Enzymes: No results for input(s): CKTOTAL, CKMB, CKMBINDEX, TROPONINI in the last 168 hours. BNP (last 3 results) No results for input(s): PROBNP in the last 8760 hours. HbA1C: No results for input(s): HGBA1C in the last 72 hours. CBG: No results for input(s): GLUCAP in the last 168 hours. Lipid Profile: No results for input(s): CHOL, HDL, LDLCALC, TRIG, CHOLHDL, LDLDIRECT in the last 72 hours. Thyroid Function Tests: No results for input(s): TSH, T4TOTAL, FREET4, T3FREE, THYROIDAB in the last 72 hours. Anemia Panel: No results for input(s): VITAMINB12, FOLATE, FERRITIN, TIBC, IRON, RETICCTPCT in the last 72 hours. Urine analysis:    Component Value Date/Time   COLORURINE YELLOW 06/19/2018 1944   APPEARANCEUR HAZY (A) 06/19/2018 1944   LABSPEC 1.014 06/19/2018 1944   PHURINE 5.0 06/19/2018 1944   GLUCOSEU 50 (A) 06/19/2018 1944   HGBUR MODERATE (A) 06/19/2018 1944   BILIRUBINUR NEGATIVE 06/19/2018 1944   KETONESUR NEGATIVE 06/19/2018 1944   PROTEINUR NEGATIVE 06/19/2018 1944   NITRITE NEGATIVE 06/19/2018 1944   LEUKOCYTESUR NEGATIVE 06/19/2018 1944   Sepsis Labs: (procalcitonin:4,lacticidven:4)  ) Recent Results (from the past 240 hour(s))  Culture, blood (routine x 2)     Status: None   Collection Time: 06/28/18 11:15 AM  Result Value Ref  Range Status   Specimen Description BLOOD RIGHT ANTECUBITAL  Final   Special Requests   Final    BOTTLES DRAWN AEROBIC ONLY Blood Culture adequate volume   Culture   Final    NO GROWTH 5 DAYS Performed at [facility], [address] [place], [place] [address]    Report Status 07/03/2018 FINAL  Final  Culture, blood (routine x 2)     Status: None   Collection Time: 06/28/18 11:20 AM  Result Value Ref Range Status   Specimen Description BLOOD RIGHT  HAND  Final   Special Requests   Final    BOTTLES DRAWN AEROBIC ONLY Blood Culture results may not be optimal due to an inadequate volume of blood received in culture bottles   Culture   Final    NO GROWTH 5 DAYS Performed at [facility], [address] [place], [place] [address]    Report Status 07/03/2018 FINAL  Final  Aerobic/Anaerobic Culture (surgical/deep wound)     Status: None   Collection Time: 06/28/18  5:27 PM  Result Value Ref Range Status   Specimen Description ABSCESS SHOULDER POSTERIOR  Final   Special Requests PATIENT ON FOLLOWING VANC  Final   Gram Stain   Final    FEW WBC PRESENT, PREDOMINANTLY PMN FEW GRAM POSITIVE COCCI    Culture   Final    No growth aerobically or anaerobically. Performed at [facility], [address] [place], [place] [address]    Report Status 07/03/2018 FINAL  Final  Aerobic/Anaerobic Culture (surgical/deep wound)     Status: None   Collection Time: 06/28/18  6:06 PM  Result Value Ref Range Status   Specimen Description JOINT FLUID AC  Final   Special Requests PATIENT ON FOLLOWING VANC FLUID ON SWABS  Final   Gram Stain   Final    FEW WBC PRESENT, PREDOMINANTLY PMN NO ORGANISMS SEEN    Culture   Final    No growth aerobically or anaerobically. Performed at [facility], [address] [place], [place] [address]    Report Status 07/03/2018 FINAL  Final      Radiology Studies: No results found.   Scheduled Meds: . diphenhydrAMINE  25 mg Oral Once   . enoxaparin (LOVENOX) injection  40 mg Subcutaneous Daily  . esomeprazole  20 mg Oral QAC breakfast  . folic acid  1 mg Oral Daily  . methocarbamol  500 mg Oral TID  . multivitamin with minerals  1 tablet Oral Daily  . polyethylene glycol  17 g Oral Daily  . senna-docusate  1 tablet Oral BID  . sodium chloride flush  10-40 mL Intracatheter Q12H  . sucralfate  1 g Oral TID WC & HS  . thiamine  100 mg Oral Daily   Or  . thiamine  100 mg Intravenous Daily  . traMADol  50 mg Oral Q6H   Continuous Infusions: . sodium chloride 10 mL/hr at 07/02/18 1800  . methocarbamol (ROBAXIN) IV    . vancomycin 750 mg (07/06/18 0315)     LOS: 16 days   Time Spent in minutes   30 minutes  Chantrell Apsey D.O. on 07/06/2018 at 9:54 AM  Between 7am to 7pm - Please see pager noted on [email or web address]  After 7pm go to [email or web address]  And look for the night coverage person covering for me after hours  [facility] Office  [phone]

## 2018-07-06 NOTE — Plan of Care (Signed)
Progressing toward goals. 

## 2018-07-06 NOTE — Progress Notes (Signed)
Occupational Therapy Treatment Patient Details Name: [name] MRN: [id number] DOB: 07/03/1965 Today's Date: 07/06/2018    History of present illness Pt. with PMH of hep C, substance abuse; admitted on 06/19/2018, presented with complaint of back pain, was found to have musculoskeletal back pain as well as right upper extremity cellulitis; Patient had a debridement of his shoulder perfromed on 5/20.    OT comments  Pt in bed upon therapy arrival and agreeable to participate in tx session. Co-tx completed with PT for portion of session. Patient able to transition to seated on EOB although unable to bear much weight onto right hip due to pain which caused him to lean onto his left forearm which was placed on a pillow. Pt tolerated EOB sitting for approximately 15 minutes before requesting to return to bed. Briefly while seated on EOB, patient tolerated some passive shoulder flexion of RUE although due to seated posture unable to tolerate more than 25% of range. Once back in bed in a semi-reclined posture patient was able to tolerate P/ROM and A/ROM of his shoulder and elbow. Education provided during session regarding elbow and wrist A/ROM to complete continuously throughout the day. Overall, patient is progressing with this RUE therapy and was eager to continue working with therapy. Patient was left in bed with bed alarm set and call light within reach. OT will continue to follow patient acutely.   Follow Up Recommendations  CIR;SNF;Supervision/Assistance - 24 hour          Precautions / Restrictions Precautions Precautions: Shoulder Type of Shoulder Precautions: Active protocol: PROM/AROM shoulder FF to 90, abduct to 60, ER to 30; verbal order per Dr. August Saucerean on 5/25 AROM elbow/wrist/hand as tolerated  Shoulder Interventions: Shoulder sling/immobilizer;For comfort Required Braces or Orthoses: Sling Restrictions Weight Bearing Restrictions: Yes RUE Weight Bearing: Non weight bearing LLE Weight  Bearing: Weight bearing as tolerated Other Position/Activity Restrictions: L LE WBAT       Mobility Bed Mobility Overal bed mobility: Needs Assistance Bed Mobility: Supine to Sit;Sit to Supine Rolling: Max assist   Supine to sit: Max assist;+2 for physical assistance Sit to supine: +2 for physical assistance;Max assist   General bed mobility comments: No real change today in amount of assist required. He had more pain and more anxiety about moving today. He was able to sit up longer and had less syncope. OT was abel to do some shoulder range while he was sitting.    Transfers   General transfer comment: Not completed this date    Balance Overall balance assessment: Needs assistance Sitting-balance support: Single extremity supported;Feet supported Sitting balance-Leahy Scale: Poor Sitting balance - Comments: Leaned on LUE placed on pillow due to increased pain when bearing weight onto right hip Postural control: Left lateral lean       ADL either performed or assessed with clinical judgement        Vision Baseline Vision/History: No visual deficits Patient Visual Report: No change from baseline            Cognition Arousal/Alertness: Awake/alert Behavior During Therapy: WFL for tasks assessed/performed;Anxious Overall Cognitive Status: Within Functional Limits for tasks assessed     General Comments: Distracted by pain         Exercises Shoulder Exercises Shoulder Flexion: PROM;AROM;Supine;Seated;Right;10 reps;Limitations(to approximately 50-60 degrees) Shoulder ABduction: PROM;AROM;Right;10 reps;Supine;Other (comment)(to 60 degrees) Shoulder External Rotation: PROM;AROM;Right;10 reps;Supine;Limitations Shoulder External Rotation Limitations: to 10 degrees Elbow Flexion: PROM;AROM;Right;10 reps;Supine Elbow Extension: PROM;AROM;Right;10 reps;Supine;Limitations Elbow Extension Limitations: patient lacking approximately 10-20 degrees  extension passively and  actively Wrist Flexion: AROM;Right;10 reps;Supine Wrist Extension: AROM;Right;10 reps;Supine   Shoulder Instructions Shoulder Instructions Donning/doffing sling/immobilizer: Maximal assistance Correct positioning of sling/immobilizer: Maximal assistance          Pertinent Vitals/ Pain       Pain Assessment: Faces Faces Pain Scale: Hurts whole lot Pain Location: left knee/low back Pain Descriptors / Indicators: Sore;Grimacing;Guarding Pain Intervention(s): Limited activity within patient's tolerance;Monitored during session;Premedicated before session;Repositioned         Frequency  Min 3X/week        Progress Toward Goals  OT Goals(current goals can now be found in the care plan section)  Progress towards OT goals: Progressing toward goals  Acute Rehab OT Goals Patient Stated Goal: To get to the commode   Plan Discharge plan remains appropriate;Frequency remains appropriate    Co-evaluation    PT/OT/SLP Co-Evaluation/Treatment: Yes Reason for Co-Treatment: Complexity of the patient's impairments (multi-system involvement);For patient/therapist safety PT goals addressed during session: Mobility/safety with mobility;Strengthening/ROM OT goals addressed during session: Strengthening/ROM;ADL's and self-care      AM-PAC OT "6 Clicks" Daily Activity     Outcome Measure   Help from another person eating meals?: A Little Help from another person taking care of personal grooming?: A Lot Help from another person toileting, which includes using toliet, bedpan, or urinal?: Total Help from another person bathing (including washing, rinsing, drying)?: Total Help from another person to put on and taking off regular upper body clothing?: Total Help from another person to put on and taking off regular lower body clothing?: Total 6 Click Score: 9    End of Session Equipment Utilized During Treatment: Other (comment)(arm sling)  OT Visit Diagnosis: Muscle weakness  (generalized) (M62.81);Pain Pain - Right/Left: Right Pain - part of body: Shoulder;Leg   Activity Tolerance Patient tolerated treatment well;Patient limited by pain   Patient Left in bed;with call bell/phone within reach;with bed alarm set           Time: 7915-0569 -co-tx with PT for portion of session OT Time Calculation (min): 44 min  Charges: OT General Charges $OT Visit: 1 Visit OT Treatments $Self Care/Home Management : 8-22 mins(14') $Therapeutic Exercise: 8-22 mins(15')  Limmie Patricia, OTR/L,CBIS  [phone]    Kyia Rhude, Charisse March 07/06/2018, 12:24 PM

## 2018-07-06 NOTE — Progress Notes (Signed)
Physical Therapy Treatment Patient Details Name: [name] Doering MRN: [id number] DOB: 11/17/1965 Today's Date: 07/06/2018    History of Present Illness Pt. with PMH of hep C, substance abuse; admitted on 06/19/2018, presented with complaint of back pain, was found to have musculoskeletal back pain as well as right upper extremity cellulitis; Patient had a debridement of his shoulder perfromed on 5/20.     PT Comments    Patient had increased difficulty sitting up straight today but was abel to sit up longer. He had increased difficutly getting his legs into position. He had no syncope today. He was left with OT performing right shoulder motion. He was advised next week we need to try to get out of the chair.    Follow Up Recommendations  SNF;CIR     Equipment Recommendations  Other (comment)    Recommendations for Other Services Rehab consult     Precautions / Restrictions Precautions Precautions: Shoulder Type of Shoulder Precautions: Active protocol: PROM/AROM shoulder FF to 90, abduct to 60, ER to 30; verbal order per Dr. August Saucerean on 5/25 AROM elbow/wrist/hand as tolerated  Shoulder Interventions: Shoulder sling/immobilizer;For comfort Restrictions Weight Bearing Restrictions: Yes RUE Weight Bearing: Non weight bearing LLE Weight Bearing: Weight bearing as tolerated    Mobility  Bed Mobility Overal bed mobility: Needs Assistance Bed Mobility: Supine to Sit;Sit to Supine Rolling: Max assist   Supine to sit: Max assist;+2 for physical assistance Sit to supine: +2 for physical assistance;Max assist   General bed mobility comments: No real change today in amount of assist required. He had more pain and more anxiety about moving today. He was able to sit up longer and had less syncope. OT was abel to do some shoulder range while he was sitting.    Transfers                    Ambulation/Gait                 Stairs             Wheelchair Mobility     Modified Rankin (Stroke Patients Only)       Balance                                            Cognition Arousal/Alertness: Awake/alert Behavior During Therapy: WFL for tasks assessed/performed;Anxious Overall Cognitive Status: Within Functional Limits for tasks assessed                                 General Comments: some continued anxiety over movement       Exercises      General Comments        Pertinent Vitals/Pain Pain Assessment: Faces Faces Pain Scale: Hurts whole lot Pain Location: left knee/ lower back  Pain Descriptors / Indicators: Sore;Grimacing;Guarding Pain Intervention(s): Limited activity within patient's tolerance;Premedicated before session;Monitored during session;Repositioned    Home Living                      Prior Function            PT Goals (current goals can now be found in the care plan section) Acute Rehab PT Goals Patient Stated Goal: To get to the commode  PT Goal Formulation: With patient Time For  Goal Achievement: 07/05/18 Potential to Achieve Goals: Good Progress towards PT goals: Progressing toward goals    Frequency    Min 3X/week      PT Plan Current plan remains appropriate    Co-evaluation PT/OT/SLP Co-Evaluation/Treatment: Yes Reason for Co-Treatment: Complexity of the patient's impairments (multi-system involvement);Necessary to address cognition/behavior during functional activity;For patient/therapist safety;To address functional/ADL transfers PT goals addressed during session: Mobility/safety with mobility;Strengthening/ROM        AM-PAC PT "6 Clicks" Mobility   Outcome Measure  Help needed turning from your back to your side while in a flat bed without using bedrails?: A Lot Help needed moving from lying on your back to sitting on the side of a flat bed without using bedrails?: A Lot Help needed moving to and from a bed to a chair (including a wheelchair)?:  Total Help needed standing up from a chair using your arms (e.g., wheelchair or bedside chair)?: Total Help needed to walk in hospital room?: Total Help needed climbing 3-5 steps with a railing? : Total 6 Click Score: 8    End of Session Equipment Utilized During Treatment: Gait belt;Other (comment) Activity Tolerance: Patient limited by pain Patient left: in bed;with call bell/phone within reach;with bed alarm set;with SCD's reapplied Nurse Communication: Mobility status PT Visit Diagnosis: Unsteadiness on feet (R26.81);Muscle weakness (generalized) (M62.81);Pain;Other abnormalities of gait and mobility (R26.89) Pain - Right/Left: Right Pain - part of body: Arm     Time: 4403-4742 PT Time Calculation (min) (ACUTE ONLY): 33 min  Charges:  $Therapeutic Activity: 8-22 mins                       Dessie Coma PT DPT  07/06/2018, 11:25 AM

## 2018-07-07 NOTE — Progress Notes (Signed)
PROGRESS NOTE    Tino Ronan  WGN:[id number] DOB: [birth year] DOA: 06/19/2018 PCP: Patient, No Pcp Per   Brief Narrative:  HPI on 06/19/2018 by Dr. Kennis Carina Lucero Ide is a 53 y.o. male with medical history significant for hepatitis C. Hx is obtained mostly from chart review as at the time of my evaluation patient is s/p Dilaudid and Ativan and unable to give me details of history, except to tell me he hurts all over and did not fall.  Patient presented to the ED with complaints of increasing back pain, radiating down his left leg, pain now going down both legs.  Patient has difficulty standing, or walking without assistance.  Pain started about after patient attempted to lift a lawnmower over a week ago.  Patient came to the ED May 5th, MRI showed severe foraminal narrowing at L5-S1, due to anterolisthesis and disc appeared worse on left.  Possible to follow-up with neurosurgery as outpatient, but he said he did not understand the instructions.  Patient was discharged on prednisone and Percocets. Patient denied vomiting and loose stools.  DAughter called that patient history of street drug use-cocaine.  Last use 2 to 3 weeks ago.  Interim history Was admitted for sepsis secondary to right upper extremity cellulitis and was started on IV antibiotics however cellulitis did not improve.  He did have CT scan which showed intrafascial abscesses in the right shoulder along with septic arthritis of the [facility] joint.  Patient had debridement of multiple abscesses.  During hospitalization patient began to have back symptoms and lower extremities weakness.  Patient did have MRI of the lumbar spine which did show discitis as well as septic arthritis and paraspinal abscesses.  During his hospitalization, he did have left knee swelling he did have arthrocentesis which showed gram-positive cocci and had left knee washout.  Patient does require 6 to 8 weeks of IV antibiotics per infectious disease.  Assessment & Plan   Sepsis secondary to cellulitis/right upper extremity abscesses/multiple lumbar abscesses and septic arthritis of the left knee -Presented with tachycardia and leukocytosis, back pain -MRI 5 5 showed severe lumbar spinal stenosis.  MRI 519 showed L4-S1 discitis, L4-L5 septic arthritis/multiple paraspinal abscesses -Patient was also noted to have left knee edema -CT right shoulder showed full intramuscular and intrafascial abscesses along with septic arthritis of the [facility] joint -Neurosurgery consulted and appreciated-commended MRI near the end of antibiotic regimen -Infectious disease consulted and appreciated, recommended IV antibiotics minimum of 6 weeks  -Orthopedic surgery consulted and appreciated -Status post left knee arthrocentesis and wash -Patient was noted to have few gram-positive cocci in fluid culture -Echocardiogram unremarkable for vegetation -Continue pain control -PT consulted recommending SNF versus a CIR (however patient not a candidate for inpt rehab at this time)  Acute kidney injury -Creatinine was 3.5 on admission -Resolved, creatinine 0.75 -Monitor BMP  Normocytic anemia -hemoglobin currently 8 -Continue to monitor CBC, transfuse if hemoglobin drops below 7  Hyponatremia -Improved, Na 132 -continue to monitor BMP  Constipation -Continue bowel regimen  Polysubstance abuse -Admits to using cocaine prior to admission.  Refuses he is ever used IV heroin -UDS positive for benzodiazepines and cocaine  DVT Prophylaxis  lovenox  Code Status: Full  Family Communication: None at bedside  Disposition Plan: Admitted. Pending completion of IV antibiotics   Consultants Neurosurgery Infectious disease Orthopedic surgery  Procedures  -5/14- left kneearthrocentesiswas done, synovial fluidgrew gram-positive cocci. -5/15 -left knee washout. -5/20-R shoulder debridement of multiple abscesses and fluid in [facility] joint.  Antibiotics    Anti-infectives (From admission, onward)   Start     Dose/Rate Route Frequency Ordered Stop   06/29/18 1400  vancomycin (VANCOCIN) IVPB 750 mg/150 ml premix     750 mg 150 mL/hr over 60 Minutes Intravenous Every 12 hours 06/29/18 1342     06/29/18 0000  vancomycin IVPB     750 mg Intravenous Every 12 hours 06/29/18 1407 08/09/18 2359   06/28/18 1735  gentamicin (GARAMYCIN) injection  Status:  Discontinued       As needed 06/28/18 1736 06/28/18 1838   06/28/18 1734  vancomycin (VANCOCIN) powder  Status:  Discontinued       As needed 06/28/18 1735 06/28/18 1838   06/26/18 1300  vancomycin (VANCOCIN) 1,250 mg in sodium chloride 0.9 % 250 mL IVPB  Status:  Discontinued     1,250 mg 166.7 mL/hr over 90 Minutes Intravenous Every 12 hours 06/26/18 1057 06/26/18 1136   06/26/18 1300  vancomycin (VANCOCIN) IVPB 1000 mg/200 mL premix  Status:  Discontinued     1,000 mg 200 mL/hr over 60 Minutes Intravenous Every 12 hours 06/26/18 1136 06/29/18 1342   06/26/18 0000  vancomycin IVPB  Status:  Discontinued     1,000 mg Intravenous Every 12 hours 06/26/18 1616 06/29/18    06/24/18 1200  vancomycin (VANCOCIN) 1,500 mg in sodium chloride 0.9 % 500 mL IVPB  Status:  Discontinued     1,500 mg 250 mL/hr over 120 Minutes Intravenous Every 24 hours 06/23/18 1040 06/26/18 1057   06/24/18 1000  cefTRIAXone (ROCEPHIN) 2 g in sodium chloride 0.9 % 100 mL IVPB  Status:  Discontinued     2 g 200 mL/hr over 30 Minutes Intravenous Every 24 hours 06/23/18 0857 06/24/18 1433   06/23/18 1848  vancomycin (VANCOCIN) powder  Status:  Discontinued       As needed 06/23/18 1848 06/23/18 1956   06/23/18 1847  gentamicin (GARAMYCIN) injection  Status:  Discontinued       As needed 06/23/18 1847 06/23/18 1956   06/23/18 0900  vancomycin (VANCOCIN) 1,500 mg in sodium chloride 0.9 % 500 mL IVPB     1,500 mg 250 mL/hr over 120 Minutes Intravenous  Once 06/23/18 0855 06/24/18 0814   06/23/18 0900  cefTRIAXone (ROCEPHIN) 1 g in  sodium chloride 0.9 % 100 mL IVPB     1 g 200 mL/hr over 30 Minutes Intravenous  Once 06/23/18 0857 06/24/18 0814   06/20/18 0900  cefTRIAXone (ROCEPHIN) 1 g in sodium chloride 0.9 % 100 mL IVPB  Status:  Discontinued     1 g 200 mL/hr over 30 Minutes Intravenous Every 24 hours 06/20/18 0824 06/23/18 0857      Subjective:   Kadon Weller seen and examined today.  States he was able to sleep better last night.  Feels very stiff and sore this morning.  Denies chest pain, shortness breath, abdominal pain, nausea or vomiting, diarrhea constipation, dizziness or headache. Objective:   Vitals:   07/06/18 1949 07/07/18 0006 07/07/18 0352 07/07/18 0801  BP: 140/79 140/76 131/78 140/78  Pulse: (!) 104 98 97 (!) 109  Resp: Temp: 98.4 F (36.9 C) 98.2 F (36.8 C) 98.4 F (36.9 C) 98 F (36.7 C)  TempSrc: Oral Oral Oral Oral  SpO2: 98% 96% 97% 99%  Weight:      Height:        Intake/Output Summary (Last 24 hours) at 07/07/2018 1139 Last data filed at 07/07/2018 [phone]  Gross per 24 hour  Intake 1307.92 ml  Output 1950 ml  Net -642.08 ml   Filed Weights   06/19/18 1303 06/23/18 1708 06/28/18 1543  Weight: 74.8 kg 74.8 kg 74.8 kg   Exam  General: Well developed, well nourished, NAD, appears stated age  HEENT: NCAT, mucous membranes moist.   Cardiovascular: S1 S2 auscultated, RRR  Respiratory: Clear to auscultation bilaterally with equal chest rise  Abdomen: Soft, nontender, nondistended, + bowel sounds  Extremities: warm dry without cyanosis clubbing or edema. RUE in sling, with seen in place.  Left knee edema stable.  Neuro: AAOx3,nonfocal  Psych: Appropriate mood and affect  Data Reviewed: I have personally reviewed following labs and imaging studies  CBC: Recent Labs  Lab 07/04/18 0140 07/05/18 0822 07/06/18 0351  WBC 9.8 7.7 7.7  NEUTROABS 7.1  --   --   HGB 7.2* 7.5* 8.0*  HCT 21.8* 23.1* 24.8*  MCV 91.2 90.9 90.8  PLT 423* 469* 553*   Basic  Metabolic Panel: Recent Labs  Lab 07/02/18 0859 07/04/18 0140 07/06/18 0351  NA 131* 131* 132*  K 3.8 3.8 3.6  CL 93* 92* 94*  CO2 28 29 29   GLUCOSE 140* 107* 110*  BUN 18 25* 22*  CREATININE 0.77 0.69 0.75  CALCIUM 8.5* 8.9 9.3   GFR: Estimated Creatinine Clearance: 110.3 mL/min (by C-G formula based on SCr of 0.75 mg/dL). Liver Function Tests: No results for input(s): AST, ALT, ALKPHOS, BILITOT, PROT, ALBUMIN in the last 168 hours. No results for input(s): LIPASE, AMYLASE in the last 168 hours. No results for input(s): AMMONIA in the last 168 hours. Coagulation Profile: No results for input(s): INR, PROTIME in the last 168 hours. Cardiac Enzymes: No results for input(s): CKTOTAL, CKMB, CKMBINDEX, TROPONINI in the last 168 hours. BNP (last 3 results) No results for input(s): PROBNP in the last 8760 hours. HbA1C: No results for input(s): HGBA1C in the last 72 hours. CBG: No results for input(s): GLUCAP in the last 168 hours. Lipid Profile: No results for input(s): CHOL, HDL, LDLCALC, TRIG, CHOLHDL, LDLDIRECT in the last 72 hours. Thyroid Function Tests: No results for input(s): TSH, T4TOTAL, FREET4, T3FREE, THYROIDAB in the last 72 hours. Anemia Panel: No results for input(s): VITAMINB12, FOLATE, FERRITIN, TIBC, IRON, RETICCTPCT in the last 72 hours. Urine analysis:    Component Value Date/Time   COLORURINE YELLOW 06/19/2018 1944   APPEARANCEUR HAZY (A) 06/19/2018 1944   LABSPEC 1.014 06/19/2018 1944   PHURINE 5.0 06/19/2018 1944   GLUCOSEU 50 (A) 06/19/2018 1944   HGBUR MODERATE (A) 06/19/2018 1944   BILIRUBINUR NEGATIVE 06/19/2018 1944   KETONESUR NEGATIVE 06/19/2018 1944   PROTEINUR NEGATIVE 06/19/2018 1944   NITRITE NEGATIVE 06/19/2018 1944   LEUKOCYTESUR NEGATIVE 06/19/2018 1944   Sepsis Labs: @LABRCNTIP (procalcitonin:4,lacticidven:4)  ) Recent Results (from the past 240 hour(s))  Culture, blood (routine x 2)     Status: None   Collection Time: 06/28/18  11:15 AM  Result Value Ref Range Status   Specimen Description BLOOD RIGHT ANTECUBITAL  Final   Special Requests   Final    BOTTLES DRAWN AEROBIC ONLY Blood Culture adequate volume   Culture   Final    NO GROWTH 5 DAYS Performed at [facility], [address] [place], [place] [address]    Report Status 07/03/2018 FINAL  Final  Culture, blood (routine x 2)     Status: None   Collection Time: 06/28/18 11:20 AM  Result Value Ref Range Status  Specimen Description BLOOD RIGHT HAND  Final   Special Requests   Final    BOTTLES DRAWN AEROBIC ONLY Blood Culture results may not be optimal due to an inadequate volume of blood received in culture bottles   Culture   Final    NO GROWTH 5 DAYS Performed at [facility], [address] [place], [place] [address]    Report Status 07/03/2018 FINAL  Final  Aerobic/Anaerobic Culture (surgical/deep wound)     Status: None   Collection Time: 06/28/18  5:27 PM  Result Value Ref Range Status   Specimen Description ABSCESS SHOULDER POSTERIOR  Final   Special Requests PATIENT ON FOLLOWING VANC  Final   Gram Stain   Final    FEW WBC PRESENT, PREDOMINANTLY PMN FEW GRAM POSITIVE COCCI    Culture   Final    No growth aerobically or anaerobically. Performed at [facility], [address] [place], [place] [address]    Report Status 07/03/2018 FINAL  Final  Aerobic/Anaerobic Culture (surgical/deep wound)     Status: None   Collection Time: 06/28/18  6:06 PM  Result Value Ref Range Status   Specimen Description JOINT FLUID AC  Final   Special Requests PATIENT ON FOLLOWING VANC FLUID ON SWABS  Final   Gram Stain   Final    FEW WBC PRESENT, PREDOMINANTLY PMN NO ORGANISMS SEEN    Culture   Final    No growth aerobically or anaerobically. Performed at [facility], [address] [place], [place] [address]    Report Status 07/03/2018 FINAL  Final      Radiology Studies: No results found.   Scheduled Meds: .  diphenhydrAMINE  25 mg Oral Once  . enoxaparin (LOVENOX) injection  40 mg Subcutaneous Daily  . esomeprazole  20 mg Oral QAC breakfast  . folic acid  1 mg Oral Daily  . methocarbamol  500 mg Oral TID  . multivitamin with minerals  1 tablet Oral Daily  . polyethylene glycol  17 g Oral Daily  . senna-docusate  1 tablet Oral BID  . sodium chloride flush  10-40 mL Intracatheter Q12H  . sucralfate  1 g Oral TID WC & HS  . thiamine  100 mg Oral Daily   Or  . thiamine  100 mg Intravenous Daily  . traMADol  50 mg Oral Q6H   Continuous Infusions: . sodium chloride 10 mL/hr at 07/02/18 1800  . methocarbamol (ROBAXIN) IV    . vancomycin 750 mg (07/07/18 0251)     LOS: 17 days   Time Spent in minutes   30 minutes  Lidiya Reise D.O. on 07/07/2018 at 11:39 AM  Between 7am to 7pm - Please see pager noted on [email or web address]  After 7pm go to [email or web address]  And look for the night coverage person covering for me after hours  [facility] Office  [phone]

## 2018-07-07 NOTE — Plan of Care (Signed)
Progressing towards goals

## 2018-07-08 LAB — BASIC METABOLIC PANEL
Anion gap: 9 (ref 5–15)
BUN: 16 mg/dL (ref 6–20)
CO2: 28 mmol/L (ref 22–32)
Calcium: 9 mg/dL (ref 8.9–10.3)
Chloride: 94 mmol/L — ABNORMAL LOW (ref 98–111)
Creatinine, Ser: 0.75 mg/dL (ref 0.61–1.24)
GFR calc Af Amer: >60 mL/min (ref >60)
GFR calc non Af Amer: >60 mL/min (ref >60)
Glucose, Bld: 112 mg/dL — ABNORMAL HIGH (ref 70–99)
Potassium: 3.6 mmol/L (ref 3.5–5.1)
Sodium: 131 mmol/L — ABNORMAL LOW (ref 135–145)

## 2018-07-08 LAB — VANCOMYCIN, TROUGH: Vancomycin Tr: 12 ug/mL — ABNORMAL LOW (ref 15–20)

## 2018-07-08 LAB — HEMOGLOBIN AND HEMATOCRIT, BLOOD
HCT: 22.8 % — ABNORMAL LOW (ref 39.0–52.0)
Hemoglobin: 7.5 g/dL — ABNORMAL LOW (ref 13.0–17.0)

## 2018-07-08 LAB — VANCOMYCIN, PEAK: Vancomycin Pk: 35 ug/mL (ref 30–40)

## 2018-07-08 NOTE — Progress Notes (Signed)
PROGRESS NOTE    [name]  ZHY:[id number] DOB: 1965/11/05 DOA: 06/19/2018 PCP: Patient, No Pcp Per   Brief Narrative:  HPI on 06/19/2018 by Dr. Kennis Carina Markez [name] is a 53 y.o. male with medical history significant for hepatitis C. Hx is obtained mostly from chart review as at the time of my evaluation patient is s/p Dilaudid and Ativan and unable to give me details of history, except to tell me he hurts all over and did not fall.  Patient presented to the ED with complaints of increasing back pain, radiating down his left leg, pain now going down both legs.  Patient has difficulty standing, or walking without assistance.  Pain started about after patient attempted to lift a lawnmower over a week ago.  Patient came to the ED May 5th, MRI showed severe foraminal narrowing at L5-S1, due to anterolisthesis and disc appeared worse on left.  Possible to follow-up with neurosurgery as outpatient, but he said he did not understand the instructions.  Patient was discharged on prednisone and Percocets. Patient denied vomiting and loose stools.  DAughter called that patient history of street drug use-cocaine.  Last use 2 to 3 weeks ago.  Interim history Was admitted for sepsis secondary to right upper extremity cellulitis and was started on IV antibiotics however cellulitis did not improve.  He did have CT scan which showed intrafascial abscesses in the right shoulder along with septic arthritis of the [facility] joint.  Patient had debridement of multiple abscesses.  During hospitalization patient began to have back symptoms and lower extremities weakness.  Patient did have MRI of the lumbar spine which did show discitis as well as septic arthritis and paraspinal abscesses.  During his hospitalization, he did have left knee swelling he did have arthrocentesis which showed gram-positive cocci and had left knee washout.  Patient does require 6 to 8 weeks of IV antibiotics per infectious disease.  Assessment & Plan   Sepsis secondary to cellulitis/right upper extremity abscesses/multiple lumbar abscesses and septic arthritis of the left knee -Presented with tachycardia and leukocytosis, back pain -MRI 5 5 showed severe lumbar spinal stenosis.  MRI 519 showed L4-S1 discitis, L4-L5 septic arthritis/multiple paraspinal abscesses -Patient was also noted to have left knee edema -CT right shoulder showed full intramuscular and intrafascial abscesses along with septic arthritis of the [facility] joint -Neurosurgery consulted and appreciated-commended MRI near the end of antibiotic regimen -Infectious disease consulted and appreciated, recommended IV antibiotics minimum of 6 weeks  -Orthopedic surgery consulted and appreciated -Status post left knee arthrocentesis and wash -Patient was noted to have few gram-positive cocci in fluid culture -Echocardiogram unremarkable for vegetation -Continue pain control -PT consulted recommending SNF versus a CIR (however patient not a candidate for inpt rehab at this time)  Acute kidney injury -Creatinine was 3.5 on admission -Resolved, creatinine 0.75 -Monitor BMP  Normocytic anemia -hemoglobin currently 7.5 -Continue to monitor CBC, transfuse if hemoglobin drops below 7  Hyponatremia -Improved, Na 131 -continue to monitor BMP  Constipation -Continue bowel regimen  Polysubstance abuse -Admits to using cocaine prior to admission.  Refuses he is ever used IV heroin -UDS positive for benzodiazepines and cocaine  DVT Prophylaxis  lovenox  Code Status: Full  Family Communication: None at bedside  Disposition Plan: Admitted. Pending completion of IV antibiotics - several more weeks  Consultants Neurosurgery Infectious disease Orthopedic surgery  Procedures  -5/14- left kneearthrocentesiswas done, synovial fluidgrew gram-positive cocci. -5/15 -left knee washout. -5/20-R shoulder debridement of multiple abscesses and fluid  in [facility]  joint.  Antibiotics   Anti-infectives (From admission, onward)   Start     Dose/Rate Route Frequency Ordered Stop   06/29/18 1400  vancomycin (VANCOCIN) IVPB 750 mg/150 ml premix     750 mg 150 mL/hr over 60 Minutes Intravenous Every 12 hours 06/29/18 1342     06/29/18 0000  vancomycin IVPB     750 mg Intravenous Every 12 hours 06/29/18 1407 08/09/18 2359   06/28/18 1735  gentamicin (GARAMYCIN) injection  Status:  Discontinued       As needed 06/28/18 1736 06/28/18 1838   06/28/18 1734  vancomycin (VANCOCIN) powder  Status:  Discontinued       As needed 06/28/18 1735 06/28/18 1838   06/26/18 1300  vancomycin (VANCOCIN) 1,250 mg in sodium chloride 0.9 % 250 mL IVPB  Status:  Discontinued     1,250 mg 166.7 mL/hr over 90 Minutes Intravenous Every 12 hours 06/26/18 1057 06/26/18 1136   06/26/18 1300  vancomycin (VANCOCIN) IVPB 1000 mg/200 mL premix  Status:  Discontinued     1,000 mg 200 mL/hr over 60 Minutes Intravenous Every 12 hours 06/26/18 1136 06/29/18 1342   06/26/18 0000  vancomycin IVPB  Status:  Discontinued     1,000 mg Intravenous Every 12 hours 06/26/18 1616 06/29/18    06/24/18 1200  vancomycin (VANCOCIN) 1,500 mg in sodium chloride 0.9 % 500 mL IVPB  Status:  Discontinued     1,500 mg 250 mL/hr over 120 Minutes Intravenous Every 24 hours 06/23/18 1040 06/26/18 1057   06/24/18 1000  cefTRIAXone (ROCEPHIN) 2 g in sodium chloride 0.9 % 100 mL IVPB  Status:  Discontinued     2 g 200 mL/hr over 30 Minutes Intravenous Every 24 hours 06/23/18 0857 06/24/18 1433   06/23/18 1848  vancomycin (VANCOCIN) powder  Status:  Discontinued       As needed 06/23/18 1848 06/23/18 1956   06/23/18 1847  gentamicin (GARAMYCIN) injection  Status:  Discontinued       As needed 06/23/18 1847 06/23/18 1956   06/23/18 0900  vancomycin (VANCOCIN) 1,500 mg in sodium chloride 0.9 % 500 mL IVPB     1,500 mg 250 mL/hr over 120 Minutes Intravenous  Once 06/23/18 0855 06/24/18 0814   06/23/18 0900   cefTRIAXone (ROCEPHIN) 1 g in sodium chloride 0.9 % 100 mL IVPB     1 g 200 mL/hr over 30 Minutes Intravenous  Once 06/23/18 0857 06/24/18 0814   06/20/18 0900  cefTRIAXone (ROCEPHIN) 1 g in sodium chloride 0.9 % 100 mL IVPB  Status:  Discontinued     1 g 200 mL/hr over 30 Minutes Intravenous Every 24 hours 06/20/18 0824 06/23/18 0857      Subjective:   Jantz Blumenstein seen and examined today.  Was able to rest well last night. No complaints this morning. States he just took some pain meds. He denies chest pain, shortness of breath, abdominal pain, N/V/D/C, dizziness, headache. Objective:   Vitals:   07/07/18 1953 07/07/18 2340 07/08/18 0314 07/08/18 0740  BP: 132/80 (!) 144/74 (!) 145/79 (!) 141/80  Pulse: (!) 105 (!) 105 (!) 103 (!) 105  Resp: 18 18 17 16   Temp: 98.5 F (36.9 C) 98.7 F (37.1 C) 98.3 F (36.8 C) 98.3 F (36.8 C)  TempSrc: Oral Oral Oral Oral  SpO2: 98% 96% 97% 96%  Weight:      Height:        Intake/Output Summary (Last 24 hours) at 07/08/2018 0940  Last data filed at 07/08/2018 1914 Gross per 24 hour  Intake 554.1 ml  Output 1800 ml  Net -1245.9 ml   Filed Weights   06/19/18 1303 06/23/18 1708 06/28/18 1543  Weight: 74.8 kg 74.8 kg 74.8 kg   Exam  General: Well developed, well nourished, NAD, appears stated age  HEENT: NCAT,mucous membranes moist.   Neck: Suppl  Cardiovascular: S1 S2 auscultated, RRR  Respiratory: Clear to auscultation bilaterally   Abdomen: Soft, nontender, nondistended, + bowel sounds  Extremities: warm dry without cyanosis clubbing or edema. RUE in sling, dressing in place. LE knee edema  Neuro: AAOx3, nonfocal  Psych: Appropriate mood and affect  Data Reviewed: I have personally reviewed following labs and imaging studies  CBC: Recent Labs  Lab 07/04/18 0140 07/05/18 0822 07/06/18 0351 07/08/18 0329  WBC 9.8 7.7 7.7  --   NEUTROABS 7.1  --   --   --   HGB 7.2* 7.5* 8.0* 7.5*  HCT 21.8* 23.1* 24.8* 22.8*  MCV  91.2 90.9 90.8  --   PLT 423* 469* 553*  --    Basic Metabolic Panel: Recent Labs  Lab 07/02/18 0859 07/04/18 0140 07/06/18 0351 07/08/18 0329  NA 131* 131* 132* 131*  K 3.8 3.8 3.6 3.6  CL 93* 92* 94* 94*  CO2 GLUCOSE 140* 107* 110* 112*  BUN 18 25* 22* 16  CREATININE 0.77 0.69 0.75 0.75  CALCIUM 8.5* 8.9 9.3 9.0   GFR: Estimated Creatinine Clearance: 110.3 mL/min (by C-G formula based on SCr of 0.75 mg/dL). Liver Function Tests: No results for input(s): AST, ALT, ALKPHOS, BILITOT, PROT, ALBUMIN in the last 168 hours. No results for input(s): LIPASE, AMYLASE in the last 168 hours. No results for input(s): AMMONIA in the last 168 hours. Coagulation Profile: No results for input(s): INR, PROTIME in the last 168 hours. Cardiac Enzymes: No results for input(s): CKTOTAL, CKMB, CKMBINDEX, TROPONINI in the last 168 hours. BNP (last 3 results) No results for input(s): PROBNP in the last 8760 hours. HbA1C: No results for input(s): HGBA1C in the last 72 hours. CBG: No results for input(s): GLUCAP in the last 168 hours. Lipid Profile: No results for input(s): CHOL, HDL, LDLCALC, TRIG, CHOLHDL, LDLDIRECT in the last 72 hours. Thyroid Function Tests: No results for input(s): TSH, T4TOTAL, FREET4, T3FREE, THYROIDAB in the last 72 hours. Anemia Panel: No results for input(s): VITAMINB12, FOLATE, FERRITIN, TIBC, IRON, RETICCTPCT in the last 72 hours. Urine analysis:    Component Value Date/Time   COLORURINE YELLOW 06/19/2018 1944   APPEARANCEUR HAZY (A) 06/19/2018 1944   LABSPEC 1.014 06/19/2018 1944   PHURINE 5.0 06/19/2018 1944   GLUCOSEU 50 (A) 06/19/2018 1944   HGBUR MODERATE (A) 06/19/2018 1944   BILIRUBINUR NEGATIVE 06/19/2018 1944   KETONESUR NEGATIVE 06/19/2018 1944   PROTEINUR NEGATIVE 06/19/2018 1944   NITRITE NEGATIVE 06/19/2018 1944   LEUKOCYTESUR NEGATIVE 06/19/2018 1944   Sepsis Labs: (procalcitonin:4,lacticidven:4)  ) Recent Results  (from the past 240 hour(s))  Culture, blood (routine x 2)     Status: None   Collection Time: 06/28/18 11:15 AM  Result Value Ref Range Status   Specimen Description BLOOD RIGHT ANTECUBITAL  Final   Special Requests   Final    BOTTLES DRAWN AEROBIC ONLY Blood Culture adequate volume   Culture   Final    NO GROWTH 5 DAYS Performed at [facility], [address], [place], [place] [address]    Report Status 07/03/2018  FINAL  Final  Culture, blood (routine x 2)     Status: None   Collection Time: 06/28/18 11:20 AM  Result Value Ref Range Status   Specimen Description BLOOD RIGHT HAND  Final   Special Requests   Final    BOTTLES DRAWN AEROBIC ONLY Blood Culture results may not be optimal due to an inadequate volume of blood received in culture bottles   Culture   Final    NO GROWTH 5 DAYS Performed at [facility], [address] [place], [place] [address]    Report Status 07/03/2018 FINAL  Final  Aerobic/Anaerobic Culture (surgical/deep wound)     Status: None   Collection Time: 06/28/18  5:27 PM  Result Value Ref Range Status   Specimen Description ABSCESS SHOULDER POSTERIOR  Final   Special Requests PATIENT ON FOLLOWING VANC  Final   Gram Stain   Final    FEW WBC PRESENT, PREDOMINANTLY PMN FEW GRAM POSITIVE COCCI    Culture   Final    No growth aerobically or anaerobically. Performed at [facility], [address] [place], [place] [address]    Report Status 07/03/2018 FINAL  Final  Aerobic/Anaerobic Culture (surgical/deep wound)     Status: None   Collection Time: 06/28/18  6:06 PM  Result Value Ref Range Status   Specimen Description JOINT FLUID AC  Final   Special Requests PATIENT ON FOLLOWING VANC FLUID ON SWABS  Final   Gram Stain   Final    FEW WBC PRESENT, PREDOMINANTLY PMN NO ORGANISMS SEEN    Culture   Final    No growth aerobically or anaerobically. Performed at [facility], [address] [place], [place] [address]    Report  Status 07/03/2018 FINAL  Final      Radiology Studies: No results found.   Scheduled Meds: . diphenhydrAMINE  25 mg Oral Once  . enoxaparin (LOVENOX) injection  40 mg Subcutaneous Daily  . esomeprazole  20 mg Oral QAC breakfast  . folic acid  1 mg Oral Daily  . methocarbamol  500 mg Oral TID  . multivitamin with minerals  1 tablet Oral Daily  . polyethylene glycol  17 g Oral Daily  . senna-docusate  1 tablet Oral BID  . sodium chloride flush  10-40 mL Intracatheter Q12H  . sucralfate  1 g Oral TID WC & HS  . thiamine  100 mg Oral Daily   Or  . thiamine  100 mg Intravenous Daily  . traMADol  50 mg Oral Q6H   Continuous Infusions: . sodium chloride 10 mL/hr at 07/02/18 1800  . methocarbamol (ROBAXIN) IV    . vancomycin 750 mg (07/08/18 0150)     LOS: 18 days   Time Spent in minutes   30 minutes  Cutler Sunday D.O. on 07/08/2018 at 9:40 AM  Between 7am to 7pm - Please see pager noted on [email or web address]  After 7pm go to [email or web address]  And look for the night coverage person covering for me after hours  [facility] Office  [phone]

## 2018-07-08 NOTE — Progress Notes (Signed)
Patient stable.  Right shoulder moves well.  Left knee is coming around.  Like to have him on a CPM machine.  DC sutures to the knee on the left on Monday.  Follow-up with me in about a week.  Continue with weightbearing as tolerated and range of motion as tolerated left knee and right shoulder

## 2018-07-08 NOTE — Progress Notes (Signed)
     Subjective: 10 Days Post-Op Procedure(s) (LRB): IRRIGATION AND DEBRIDEMENT SHOULDER (Right) ACROMIO-CLAVICULAR JOINT IRRIGATION AND DEBRIDEMENT (Right) The right shoulder really feels good. Left knee swelling is improved.  Patient reports pain as mild.    Objective:   VITALS:  Temp:  [98.3 F (36.8 C)-99.7 F (37.6 C)] 98.6 F (37 C) (05/30 1229) Pulse Rate:  [101-105] 101 (05/30 1229) Resp:  [13-18] 15 (05/30 1229) BP: (116-156)/(74-90) 156/83 (05/30 1229) SpO2:  [96 %-98 %] 98 % (05/30 1229)  Neurologically intact ABD soft Neurovascular intact Sensation intact distally Intact pulses distally Dorsiflexion/Plantar flexion intact Incision: no drainage and Right shoulder left knee dressings intact and suture line is dry without erythrema.   LABS Recent Labs    07/06/18 0351 07/08/18 0329  HGB 8.0* 7.5*  WBC 7.7  --   PLT 553*  --    Recent Labs    07/06/18 0351 07/08/18 0329  NA 132* 131*  K 3.6 3.6  CL 94* 94*  CO2 29 28  BUN 22* 16  CREATININE 0.75 0.75  GLUCOSE 110* 112*   No results for input(s): LABPT, INR in the last 72 hours.   Assessment/Plan: 10 Days Post-Op Procedure(s) (LRB): IRRIGATION AND DEBRIDEMENT SHOULDER (Right) ACROMIO-CLAVICULAR JOINT IRRIGATION AND DEBRIDEMENT (Right)  Advance diet Up with therapy Continue ABX therapy due to Post-op infection  [name] 07/08/2018, 12:44 PMPatient ID: [name], male   DOB: [birth year], 53 y.o.   MRN: [id number]

## 2018-07-09 MED ORDER — POLYVINYL ALCOHOL 1.4 % OP SOLN
1.0000 [drp] | OPHTHALMIC | Status: DC | PRN
  Administered 2018-07-09: 1 [drp] via OPHTHALMIC
  Filled 2018-07-09: qty 15

## 2018-07-09 NOTE — Progress Notes (Signed)
Pharmacy Antibiotic Note  [name] is a 53 y.o. male admitted on 06/19/2018 with back pain, now with septic arthritis of multiple joints. Pharmacy has been consulted for vancomycin dosing. Patient is s/p I&D of R shoulder and the [facility] joint on 5/20.  Vancomycin levels demonstrate therapeutic AUC,  Cr has remained stable, planning for 6 weeks of ABX per ID. OPAT has been entered, no changes for now.   Plan: -Continue vancomycin 750mg  IV q12h -Monitor renal function, vancomycin levels as needed  Height: 5\' 10"  (177.8 cm) Weight: 165 lb (74.8 kg) IBW/kg (Calculated) : 73  Temp (24hrs), Avg:98.6 F (37 C), Min:98.2 F (36.8 C), Max:98.9 F (37.2 C)  Recent Labs  Lab 07/02/18 0859 07/04/18 0140 07/05/18 0822 07/06/18 0351 07/08/18 0327 07/08/18 0329 07/08/18 1332  WBC  --  9.8 7.7 7.7  --   --   --   CREATININE 0.77 0.69  --  0.75  --  0.75  --   VANCOTROUGH  --   --   --   --   --   --  12*  VANCOPEAK  --   --   --   --  35  --   --     Estimated Creatinine Clearance: 110.3 mL/min (by C-G formula based on SCr of 0.75 mg/dL).    No Known Allergies   Vanc 5/15 >> CTX 5/12 >> 5/16  5/11 covid - negative 5/12 BCx - negative 5/14 synovial fluid L knee - negative 5/15 synovial fluid L knee - GPC on stain 5/20 BCx: ng x 1 day 5/20 abscess shoulder posterior - few GPC on stain 5/20 AC joint fluid:   Thank you for allowing pharmacy to be a part of this patient's care. Okey Regal, PharmD  Please utilize Amion for appropriate phone number to reach the unit pharmacist [facility])   07/09/2018

## 2018-07-09 NOTE — Progress Notes (Signed)
     Subjective: 11 Days Post-Op Procedure(s) (LRB): IRRIGATION AND DEBRIDEMENT SHOULDER (Right) ACROMIO-CLAVICULAR JOINT IRRIGATION AND DEBRIDEMENT (Right) No complaints. Patient reports pain as mild.    Objective:   VITALS:  Temp:  [98.2 F (36.8 C)-98.9 F (37.2 C)] 98.4 F (36.9 C) (05/31 1207) Pulse Rate:  [97-108] 107 (05/31 1207) Resp:  [11-16] 16 (05/31 1207) BP: (135-148)/(76-86) 135/86 (05/31 1207) SpO2:  [96 %-99 %] 99 % (05/31 1207)  Neurologically intact ABD soft Neurovascular intact Sensation intact distally Intact pulses distally Dorsiflexion/Plantar flexion intact Incision: scant drainage No cellulitis present Dressing right shoulder front superior and anterior changed with dry 4x4s and hypofix tape.   LABS Recent Labs    07/08/18 0329  HGB 7.5*   Recent Labs    07/08/18 0329  NA 131*  K 3.6  CL 94*  CO2 28  BUN 16  CREATININE 0.75  GLUCOSE 112*   No results for input(s): LABPT, INR in the last 72 hours.   Assessment/Plan: 11 Days Post-Op Procedure(s) (LRB): IRRIGATION AND DEBRIDEMENT SHOULDER (Right) ACROMIO-CLAVICULAR JOINT IRRIGATION AND DEBRIDEMENT (Right)  Advance diet Up with therapy  Saqib Sunde 07/09/2018, 1:20 PMPatient ID: [name], male   DOB: 17-Dec-1965, 53 y.o.   MRN: [id number]

## 2018-07-09 NOTE — Progress Notes (Signed)
PROGRESS NOTE    [name]  XBJ:[id number] DOB: 1965-08-26 DOA: 06/19/2018 PCP: Patient, No Pcp Per   Brief Narrative:  HPI on 06/19/2018 by Dr. Kennis Carina Bradshaw Chan is a 53 y.o. male with medical history significant for hepatitis C. Hx is obtained mostly from chart review as at the time of my evaluation patient is s/p Dilaudid and Ativan and unable to give me details of history, except to tell me he hurts all over and did not fall.  Patient presented to the ED with complaints of increasing back pain, radiating down his left leg, pain now going down both legs.  Patient has difficulty standing, or walking without assistance.  Pain started about after patient attempted to lift a lawnmower over a week ago.  Patient came to the ED May 5th, MRI showed severe foraminal narrowing at L5-S1, due to anterolisthesis and disc appeared worse on left.  Possible to follow-up with neurosurgery as outpatient, but he said he did not understand the instructions.  Patient was discharged on prednisone and Percocets. Patient denied vomiting and loose stools.  DAughter called that patient history of street drug use-cocaine.  Last use 2 to 3 weeks ago.  Interim history Was admitted for sepsis secondary to right upper extremity cellulitis and was started on IV antibiotics however cellulitis did not improve.  He did have CT scan which showed intrafascial abscesses in the right shoulder along with septic arthritis of the [facility] joint.  Patient had debridement of multiple abscesses.  During hospitalization patient began to have back symptoms and lower extremities weakness.  Patient did have MRI of the lumbar spine which did show discitis as well as septic arthritis and paraspinal abscesses.  During his hospitalization, he did have left knee swelling he did have arthrocentesis which showed gram-positive cocci and had left knee washout.  Patient does require 6 to 8 weeks of IV antibiotics per infectious disease.  Assessment & Plan   Sepsis secondary to cellulitis/right upper extremity abscesses/multiple lumbar abscesses and septic arthritis of the left knee -Presented with tachycardia and leukocytosis, back pain -MRI 5 5 showed severe lumbar spinal stenosis.  MRI 5/19 showed L4-S1 discitis, L4-L5 septic arthritis/multiple paraspinal abscesses -Patient was also noted to have left knee edema -CT right shoulder showed full intramuscular and intrafascial abscesses along with septic arthritis of the [facility] joint -Neurosurgery consulted and appreciated-commended MRI near the end of antibiotic regimen -Infectious disease consulted and appreciated, recommended IV antibiotics minimum of 6 weeks  -Orthopedic surgery consulted and appreciated -Status post left knee arthrocentesis and wash -Patient was noted to have few gram-positive cocci in fluid culture -Echocardiogram unremarkable for vegetation -Continue pain control -PT consulted recommending SNF versus a CIR (however patient not a candidate for inpt rehab at this time) -Ortho planning to remove sutures on Monday from L knee. Would like to have patient on CPM machine. Patient to follow up with Dr. August Chan in one week (however, patient will still be admitted at that time)  Acute kidney injury -Creatinine was 3.5 on admission -Resolved, creatinine 0.75 -Monitor BMP  Normocytic anemia -hemoglobin currently 7.5 -Continue to monitor CBC, transfuse if hemoglobin drops below 7  Hyponatremia -Improved, Na 131 -continue to monitor BMP  Constipation -Continue bowel regimen  Polysubstance abuse -Admits to using cocaine prior to admission.  Refuses he is ever used IV heroin -UDS positive for benzodiazepines and cocaine  DVT Prophylaxis  lovenox  Code Status: Full  Family Communication: None at bedside  Disposition Plan: Admitted. Pending completion  of IV antibiotics - several more weeks  Consultants Neurosurgery Infectious disease Orthopedic surgery   Procedures  -5/14- left kneearthrocentesiswas done, synovial fluidgrew gram-positive cocci. -5/15 -left knee washout. -5/20-R shoulder debridement of multiple abscesses and [facility].  Antibiotics   Anti-infectives (From admission, onward)   Start     Dose/Rate Route Frequency Ordered Stop   06/29/18 1400  vancomycin (VANCOCIN) IVPB 750 mg/150 ml premix     750 mg 150 mL/hr over 60 Minutes Intravenous Every 12 hours 06/29/18 1342     06/29/18 0000  vancomycin IVPB     750 mg Intravenous Every 12 hours 06/29/18 1407 08/09/18 2359   06/28/18 1735  gentamicin (GARAMYCIN) injection  Status:  Discontinued       As needed 06/28/18 1736 06/28/18 1838   06/28/18 1734  vancomycin (VANCOCIN) powder  Status:  Discontinued       As needed 06/28/18 1735 06/28/18 1838   06/26/18 1300  vancomycin (VANCOCIN) 1,250 mg in sodium chloride 0.9 % 250 mL IVPB  Status:  Discontinued     1,250 mg 166.7 mL/hr over 90 Minutes Intravenous Every 12 hours 06/26/18 1057 06/26/18 1136   06/26/18 1300  vancomycin (VANCOCIN) IVPB 1000 mg/200 mL premix  Status:  Discontinued     1,000 mg 200 mL/hr over 60 Minutes Intravenous Every 12 hours 06/26/18 1136 06/29/18 1342   06/26/18 0000  vancomycin IVPB  Status:  Discontinued     1,000 mg Intravenous Every 12 hours 06/26/18 1616 06/29/18    06/24/18 1200  vancomycin (VANCOCIN) 1,500 mg in sodium chloride 0.9 % 500 mL IVPB  Status:  Discontinued     1,500 mg 250 mL/hr over 120 Minutes Intravenous Every 24 hours 06/23/18 1040 06/26/18 1057   06/24/18 1000  cefTRIAXone (ROCEPHIN) 2 g in sodium chloride 0.9 % 100 mL IVPB  Status:  Discontinued     2 g 200 mL/hr over 30 Minutes Intravenous Every 24 hours 06/23/18 0857 06/24/18 1433   06/23/18 1848  vancomycin (VANCOCIN) powder  Status:  Discontinued       As needed 06/23/18 1848 06/23/18 1956   06/23/18 1847  gentamicin (GARAMYCIN) injection  Status:  Discontinued       As needed 06/23/18 1847 06/23/18 1956    06/23/18 0900  vancomycin (VANCOCIN) 1,500 mg in sodium chloride 0.9 % 500 mL IVPB     1,500 mg 250 mL/hr over 120 Minutes Intravenous  Once 06/23/18 0855 06/24/18 0814   06/23/18 0900  cefTRIAXone (ROCEPHIN) 1 g in sodium chloride 0.9 % 100 mL IVPB     1 g 200 mL/hr over 30 Minutes Intravenous  Once 06/23/18 0857 06/24/18 0814   06/20/18 0900  cefTRIAXone (ROCEPHIN) 1 g in sodium chloride 0.9 % 100 mL IVPB  Status:  Discontinued     1 g 200 mL/hr over 30 Minutes Intravenous Every 24 hours 06/20/18 0824 06/23/18 0857      Subjective:   [name] seen and examined today.  States he slept well overnight.  Has no complaints this morning.  Denies chest pain, shortness breath, abdominal pain, nausea vomiting, diarrhea constipation.  States he is trying to move his left leg more.  Feels his right shoulder has done much better. Objective:   Vitals:   07/08/18 1948 07/09/18 0012 07/09/18 0333 07/09/18 0746  BP: (!) 142/76 (!) 141/84 (!) 147/77 (!) 148/83  Pulse: (!) 108 (!) 106 97 (!) 101  Resp: 14 12 11 15   Temp: 98.6  F (37 C) 98.9 F (37.2 C) 98.2 F (36.8 C) 98.2 F (36.8 C)  TempSrc: Oral Oral Oral Oral  SpO2: 97% 96% 96% 98%  Weight:      Height:        Intake/Output Summary (Last 24 hours) at 07/09/2018 1136 Last data filed at 07/09/2018 0500 Gross per 24 hour  Intake -  Output 500 ml  Net -500 ml   Filed Weights   06/19/18 1303 06/23/18 1708 06/28/18 1543  Weight: 74.8 kg 74.8 kg 74.8 kg   Exam (no change from exam on 07/08/2018)  General: Well developed, well nourished, NAD, appears stated age  HEENT: NCAT,mucous membranes moist.   Neck: Suppl  Cardiovascular: S1 S2 auscultated, RRR  Respiratory: Clear to auscultation bilaterally   Abdomen: Soft, nontender, nondistended, + bowel sounds  Extremities: warm dry without cyanosis clubbing or edema. RUE in sling, dressing in place. LE knee edema- improved  Neuro: AAOx3, nonfocal  Psych: Appropriate mood  and affect  Data Reviewed: I have personally reviewed following labs and imaging studies  CBC: Recent Labs  Lab 07/04/18 0140 07/05/18 0822 07/06/18 0351 07/08/18 0329  WBC 9.8 7.7 7.7  --   NEUTROABS 7.1  --   --   --   HGB 7.2* 7.5* 8.0* 7.5*  HCT 21.8* 23.1* 24.8* 22.8*  MCV 91.2 90.9 90.8  --   PLT 423* 469* 553*  --    Basic Metabolic Panel: Recent Labs  Lab 07/04/18 0140 07/06/18 0351 07/08/18 0329  NA 131* 132* 131*  K 3.8 3.6 3.6  CL 92* 94* 94*  CO2 29 29 28   GLUCOSE 107* 110* 112*  BUN 25* 22* 16  CREATININE 0.69 0.75 0.75  CALCIUM 8.9 9.3 9.0   GFR: Estimated Creatinine Clearance: 110.3 mL/min (by C-G formula based on SCr of 0.75 mg/dL). Liver Function Tests: No results for input(s): AST, ALT, ALKPHOS, BILITOT, PROT, ALBUMIN in the last 168 hours. No results for input(s): LIPASE, AMYLASE in the last 168 hours. No results for input(s): AMMONIA in the last 168 hours. Coagulation Profile: No results for input(s): INR, PROTIME in the last 168 hours. Cardiac Enzymes: No results for input(s): CKTOTAL, CKMB, CKMBINDEX, TROPONINI in the last 168 hours. BNP (last 3 results) No results for input(s): PROBNP in the last 8760 hours. HbA1C: No results for input(s): HGBA1C in the last 72 hours. CBG: No results for input(s): GLUCAP in the last 168 hours. Lipid Profile: No results for input(s): CHOL, HDL, LDLCALC, TRIG, CHOLHDL, LDLDIRECT in the last 72 hours. Thyroid Function Tests: No results for input(s): TSH, T4TOTAL, FREET4, T3FREE, THYROIDAB in the last 72 hours. Anemia Panel: No results for input(s): VITAMINB12, FOLATE, FERRITIN, TIBC, IRON, RETICCTPCT in the last 72 hours. Urine analysis:    Component Value Date/Time   COLORURINE YELLOW 06/19/2018 1944   APPEARANCEUR HAZY (A) 06/19/2018 1944   LABSPEC 1.014 06/19/2018 1944   PHURINE 5.0 06/19/2018 1944   GLUCOSEU 50 (A) 06/19/2018 1944   HGBUR MODERATE (A) 06/19/2018 1944   BILIRUBINUR NEGATIVE  06/19/2018 1944   KETONESUR NEGATIVE 06/19/2018 1944   PROTEINUR NEGATIVE 06/19/2018 1944   NITRITE NEGATIVE 06/19/2018 1944   LEUKOCYTESUR NEGATIVE 06/19/2018 1944   Sepsis Labs: @LABRCNTIP (procalcitonin:4,lacticidven:4)  ) No results found for this or any previous visit (from the past 240 hour(s)).    Radiology Studies: No results found.   Scheduled Meds: . diphenhydrAMINE  25 mg Oral Once  . enoxaparin (LOVENOX) injection  40 mg Subcutaneous Daily  .  esomeprazole  20 mg Oral QAC breakfast  . folic acid  1 mg Oral Daily  . methocarbamol  500 mg Oral TID  . multivitamin with minerals  1 tablet Oral Daily  . polyethylene glycol  17 g Oral Daily  . senna-docusate  1 tablet Oral BID  . sodium chloride flush  10-40 mL Intracatheter Q12H  . sucralfate  1 g Oral TID WC & HS  . thiamine  100 mg Oral Daily   Or  . thiamine  100 mg Intravenous Daily  . traMADol  50 mg Oral Q6H   Continuous Infusions: . sodium chloride 10 mL/hr at 07/02/18 1800  . methocarbamol (ROBAXIN) IV    . vancomycin 750 mg (07/09/18 0234)     LOS: 19 days   Time Spent in minutes   30 minutes  Ercil Cassis D.O. on 07/09/2018 at 11:36 AM  Between 7am to 7pm - Please see pager noted on [email or web address]  After 7pm go to [email or web address]  And look for the night coverage person covering for me after hours  [facility] Office  [phone]

## 2018-07-10 ENCOUNTER — Encounter (HOSPITAL_COMMUNITY): Payer: Self-pay | Admitting: Orthopedic Surgery

## 2018-07-10 LAB — BASIC METABOLIC PANEL WITH GFR
Anion gap: 10 (ref 5–15)
BUN: 15 mg/dL (ref 6–20)
CO2: 27 mmol/L (ref 22–32)
Calcium: 8.9 mg/dL (ref 8.9–10.3)
Chloride: 93 mmol/L — ABNORMAL LOW (ref 98–111)
Creatinine, Ser: 0.69 mg/dL (ref 0.61–1.24)
GFR calc Af Amer: >60 mL/min
GFR calc non Af Amer: >60 mL/min
Glucose, Bld: 100 mg/dL — ABNORMAL HIGH (ref 70–99)
Potassium: 3.7 mmol/L (ref 3.5–5.1)
Sodium: 130 mmol/L — ABNORMAL LOW (ref 135–145)

## 2018-07-10 LAB — CBC WITH DIFFERENTIAL/PLATELET
Abs Immature Granulocytes: 0.28 10*3/uL — ABNORMAL HIGH (ref 0.00–0.07)
Basophils Absolute: 0 10*3/uL (ref 0.0–0.1)
Basophils Relative: 0 %
Eosinophils Absolute: 0.3 10*3/uL (ref 0.0–0.5)
Eosinophils Relative: 3 %
HCT: 22.5 % — ABNORMAL LOW (ref 39.0–52.0)
Hemoglobin: 7.3 g/dL — ABNORMAL LOW (ref 13.0–17.0)
Immature Granulocytes: 3 %
Lymphocytes Relative: 20 %
Lymphs Abs: 2 10*3/uL (ref 0.7–4.0)
MCH: 29 pg (ref 26.0–34.0)
MCHC: 32.4 g/dL (ref 30.0–36.0)
MCV: 89.3 fL (ref 80.0–100.0)
Monocytes Absolute: 0.7 10*3/uL (ref 0.1–1.0)
Monocytes Relative: 7 %
Neutro Abs: 6.5 10*3/uL (ref 1.7–7.7)
Neutrophils Relative %: 67 %
Platelets: 799 10*3/uL — ABNORMAL HIGH (ref 150–400)
RBC: 2.52 MIL/uL — ABNORMAL LOW (ref 4.22–5.81)
RDW: 15.5 % (ref 11.5–15.5)
WBC: 9.8 10*3/uL (ref 4.0–10.5)
nRBC: 0 % (ref 0.0–0.2)

## 2018-07-10 MED ORDER — BISACODYL 10 MG RE SUPP
10.0000 mg | Freq: Every day | RECTAL | Status: DC | PRN
  Administered 2018-07-26: 21:00:00 10 mg via RECTAL
  Filled 2018-07-10: qty 1

## 2018-07-10 NOTE — Progress Notes (Signed)
Occupational Therapy Treatment Patient Details Name: [name] Scalici MRN: [id number] DOB: 08/06/1965 Today's Date: 07/10/2018    History of present illness Pt. with PMH of hep C, substance abuse; admitted on 06/19/2018, presented with complaint of back pain, was found to have musculoskeletal back pain as well as right upper extremity cellulitis; Patient had a debridement of his shoulder perfromed on 5/20.    OT comments  Pt progressing slowly.  Requires increased time and effort for all activities. But able to complete bed mobility with min assist for L LE mgmt, maintain static sitting balance at EOB with close supervision, and squat pivot transfers with mod assist +2 safety to recliner.  Engaged in self care from recliner, min assist for UB ADLs and setup for grooming.  Reviewed MD order for R UE NWB at this time. Will follow.    Follow Up Recommendations  Other (comment);Supervision/Assistance - 24 hour(post acute rehab (CIR vs SNF) )    Equipment Recommendations  Other (comment)(TBD)    Recommendations for Other Services Rehab consult    Precautions / Restrictions Precautions Precautions: Shoulder Type of Shoulder Precautions: Active protocol: PROM/AROM shoulder FF to 90, abduct to 60, ER to 30; verbal order per Dr. August Saucerean on 5/25 AROM elbow/wrist/hand as tolerated  Shoulder Interventions: Shoulder sling/immobilizer;For comfort Required Braces or Orthoses: Sling Restrictions Weight Bearing Restrictions: Yes RUE Weight Bearing: Non weight bearing LLE Weight Bearing: Weight bearing as tolerated       Mobility Bed Mobility Overal bed mobility: Needs Assistance Bed Mobility: Supine to Sit       Sit to supine: Min assist;+2 for safety/equipment   General bed mobility comments: requires assist for L LE and increased time for trunk mobility; +2 safety   Transfers Overall transfer level: Needs assistance   Transfers: Squat Pivot Transfers     Squat pivot transfers: Mod assist;+2  safety/equipment     General transfer comment: face to face, squat pivot transfer to recliner on L side with mod assist +2 safety, cueing for technique, safety     Balance Overall balance assessment: Needs assistance Sitting-balance support: No upper extremity supported;Feet supported Sitting balance-Leahy Scale: Fair Sitting balance - Comments: able to maintain midline posture without UE support or cueing today                                   ADL either performed or assessed with clinical judgement   ADL Overall ADL's : Needs assistance/impaired     Grooming: Sitting;Wash/dry hands;Wash/dry face;Set up   Upper Body Bathing: Minimal assistance;Sitting Upper Body Bathing Details (indicate cue type and reason): min assist for throughness, decreased functional use of R UE      Upper Body Dressing : Minimal assistance Upper Body Dressing Details (indicate cue type and reason): donning new gown, educated on compensatory dressing techniques     Toilet Transfer: Moderate assistance;+2 for safety/equipment;Squat-pivot Toilet Transfer Details (indicate cue type and reason): simulated to recliner, towards L side         Functional mobility during ADLs: Moderate assistance;+2 for safety/equipment General ADL Comments: pt continues to be limited by pain, impaired balance, decreased functional use of R UE     Vision       Perception     Praxis      Cognition Arousal/Alertness: Awake/alert Behavior During Therapy: WFL for tasks assessed/performed;Anxious Overall Cognitive Status: Within Functional Limits for tasks assessed  General Comments: Distracted by pain         Exercises     Shoulder Instructions Shoulder Instructions Correct positioning of sling/immobilizer: Minimal assistance     General Comments readjusted sling on R UE, continued education on NWB     Pertinent Vitals/ Pain       Pain  Assessment: Faces Faces Pain Scale: Hurts whole lot Pain Location: L knee/ low back Pain Descriptors / Indicators: Sore;Grimacing;Guarding Pain Intervention(s): Limited activity within patient's tolerance;Premedicated before session;Repositioned;Monitored during session  Home Living                                          Prior Functioning/Environment              Frequency  Min 3X/week        Progress Toward Goals  OT Goals(current goals can now be found in the care plan section)  Progress towards OT goals: Progressing toward goals  Acute Rehab OT Goals Patient Stated Goal: To get to the commode  OT Goal Formulation: With patient  Plan Discharge plan remains appropriate;Frequency remains appropriate    Co-evaluation    PT/OT/SLP Co-Evaluation/Treatment: Yes Reason for Co-Treatment: Complexity of the patient's impairments (multi-system involvement)   OT goals addressed during session: ADL's and self-care;Strengthening/ROM      AM-PAC OT "6 Clicks" Daily Activity     Outcome Measure   Help from another person eating meals?: None Help from another person taking care of personal grooming?: A Little Help from another person toileting, which includes using toliet, bedpan, or urinal?: Total Help from another person bathing (including washing, rinsing, drying)?: A Lot Help from another person to put on and taking off regular upper body clothing?: A Little Help from another person to put on and taking off regular lower body clothing?: Total 6 Click Score: 14    End of Session Equipment Utilized During Treatment: Gait belt;Other (comment)(R UE sling )  OT Visit Diagnosis: Muscle weakness (generalized) (M62.81);Pain Pain - Right/Left: Right Pain - part of body: Shoulder;Leg   Activity Tolerance Patient tolerated treatment well   Patient Left in chair;with call bell/phone within reach;with chair alarm set;with nursing/sitter in room   Nurse  Communication Mobility status        Time: 4481-8563 OT Time Calculation (min): 28 min  Charges: OT General Charges $OT Visit: 1 Visit OT Treatments $Self Care/Home Management : 8-22 mins  Chancy Milroy, OT Acute Rehabilitation Services Pager [phone] Office [phone]    Chancy Milroy 07/10/2018, 1:36 PM

## 2018-07-10 NOTE — Progress Notes (Signed)
Physical Therapy Treatment Patient Details Name: [name] Paganelli MRN: [id number] DOB: 12/12/1965 Today's Date: 07/10/2018    History of Present Illness Pt. with PMH of hep C, substance abuse; admitted on 06/19/2018, presented with complaint of back pain, was found to have musculoskeletal back pain as well as right upper extremity cellulitis; Patient had a debridement of his shoulder perfromed on 5/20.     PT Comments    Patient sat up for 2 hours. He reported increased pain in his back after sitting. He required max a to transfer back to the bed. He required mod a+2 to lie back down. He would benefit from transfer to a commode tomorrow.  SNF remains appropriate.   Follow Up Recommendations  SNF;CIR     Equipment Recommendations  Other (comment)    Recommendations for Other Services Rehab consult     Precautions / Restrictions Precautions Precautions: Shoulder Type of Shoulder Precautions: Active protocol: PROM/AROM shoulder FF to 90, abduct to 60, ER to 30; verbal order per Dr. August Saucerean on 5/25 AROM elbow/wrist/hand as tolerated  Shoulder Interventions: Shoulder sling/immobilizer;For comfort Required Braces or Orthoses: Sling Restrictions Weight Bearing Restrictions: Yes RUE Weight Bearing: Non weight bearing LLE Weight Bearing: Weight bearing as tolerated    Mobility  Bed Mobility Overal bed mobility: Needs Assistance Bed Mobility: Supine to Sit Rolling: Max assist     Sit to supine: Min assist;+2 for safety/equipment   General bed mobility comments: requires assist for L LE and increased time for trunk mobility; +2 safety   Transfers Overall transfer level: Needs assistance   Transfers: Squat Pivot Transfers     Squat pivot transfers: Max assist;+2 physical assistance     General transfer comment: Increased assist to get back to bed   Ambulation/Gait                 Stairs             Wheelchair Mobility    Modified Rankin (Stroke Patients Only)        Balance Overall balance assessment: Needs assistance Sitting-balance support: No upper extremity supported;Feet supported Sitting balance-Leahy Scale: Fair Sitting balance - Comments: able to maintain midline posture without UE support or cueing today   Standing balance support: Bilateral upper extremity supported Standing balance-Leahy Scale: Poor Standing balance comment: unable to come to full stand                             Cognition Arousal/Alertness: Awake/alert Behavior During Therapy: [facility] for tasks assessed/performed;Anxious Overall Cognitive Status: Within Functional Limits for tasks assessed                                 General Comments: Distracted by pain but better today       Exercises Correct positioning of sling/immobilizer: Minimal assistance    General Comments General comments (skin integrity, edema, etc.): likes to have therapy move his leg using a blanket       Pertinent Vitals/Pain Pain Assessment: Faces Faces Pain Scale: Hurts whole lot Pain Location: L knee/ low back Pain Descriptors / Indicators: Sore;Grimacing;Guarding Pain Intervention(s): Limited activity within patient's tolerance;Premedicated before session;Repositioned    Home Living                      Prior Function            PT  Goals (current goals can now be found in the care plan section) Acute Rehab PT Goals Patient Stated Goal: To get to the commode  PT Goal Formulation: With patient Time For Goal Achievement: 07/05/18 Potential to Achieve Goals: Good Progress towards PT goals: Progressing toward goals    Frequency    Min 3X/week      PT Plan Current plan remains appropriate    Co-evaluation PT/OT/SLP Co-Evaluation/Treatment: Yes Reason for Co-Treatment: Complexity of the patient's impairments (multi-system involvement)   OT goals addressed during session: ADL's and self-care      AM-PAC PT "6 Clicks" Mobility    Outcome Measure  Help needed turning from your back to your side while in a flat bed without using bedrails?: A Lot Help needed moving from lying on your back to sitting on the side of a flat bed without using bedrails?: A Lot Help needed moving to and from a bed to a chair (including a wheelchair)?: A Lot Help needed standing up from a chair using your arms (e.g., wheelchair or bedside chair)?: Total Help needed to walk in hospital room?: Total Help needed climbing 3-5 steps with a railing? : Total 6 Click Score: 9    End of Session Equipment Utilized During Treatment: Gait belt;Other (comment) Activity Tolerance: Patient limited by pain Patient left: with call bell/phone within reach;with SCD's reapplied;in chair Nurse Communication: Mobility status PT Visit Diagnosis: Unsteadiness on feet (R26.81);Muscle weakness (generalized) (M62.81);Pain;Other abnormalities of gait and mobility (R26.89) Pain - Right/Left: Right Pain - part of body: Arm     Time: 1155-1210 PT Time Calculation (min) (ACUTE ONLY): 15 min  Charges:  $Therapeutic Exercise: 8-22 mins $Therapeutic Activity: 8-22 mins                        Dessie Coma PT DPT 07/10/2018, 2:15 PM

## 2018-07-10 NOTE — Progress Notes (Signed)
PROGRESS NOTE    Sreeram Corsino  YQM:[id number] DOB: 1965/03/05 DOA: 06/19/2018 PCP: Patient, No Pcp Per   Brief Narrative:  HPI on 06/19/2018 by Dr. Kennis Carina [name] is a 53 y.o. male with medical history significant for hepatitis C. Hx is obtained mostly from chart review as at the time of my evaluation patient is s/p Dilaudid and Ativan and unable to give me details of history, except to tell me he hurts all over and did not fall.  Patient presented to the ED with complaints of increasing back pain, radiating down his left leg, pain now going down both legs.  Patient has difficulty standing, or walking without assistance.  Pain started about after patient attempted to lift a lawnmower over a week ago.  Patient came to the ED May 5th, MRI showed severe foraminal narrowing at L5-S1, due to anterolisthesis and disc appeared worse on left.  Possible to follow-up with neurosurgery as outpatient, but he said he did not understand the instructions.  Patient was discharged on prednisone and Percocets. Patient denied vomiting and loose stools.  DAughter called that patient history of street drug use-cocaine.  Last use 2 to 3 weeks ago.  Interim history Was admitted for sepsis secondary to right upper extremity cellulitis and was started on IV antibiotics however cellulitis did not improve.  He did have CT scan which showed intrafascial abscesses in the right shoulder along with septic arthritis of the [facility] joint.  Patient had debridement of multiple abscesses.  During hospitalization patient began to have back symptoms and lower extremities weakness.  Patient did have MRI of the lumbar spine which did show discitis as well as septic arthritis and paraspinal abscesses.  During his hospitalization, he did have left knee swelling he did have arthrocentesis which showed gram-positive cocci and had left knee washout.  Patient does require 6 to 8 weeks of IV antibiotics per infectious disease.  Assessment & Plan   Sepsis secondary to cellulitis/right upper extremity abscesses/multiple lumbar abscesses and septic arthritis of the left knee -Presented with tachycardia and leukocytosis, back pain -MRI 5 5 showed severe lumbar spinal stenosis.  MRI 5/19 showed L4-S1 discitis, L4-L5 septic arthritis/multiple paraspinal abscesses -Patient was also noted to have left knee edema -CT right shoulder showed full intramuscular and intrafascial abscesses along with septic arthritis of the [facility] joint -Neurosurgery consulted and appreciated-commended MRI near the end of antibiotic regimen -Infectious disease consulted and appreciated, recommended IV antibiotics minimum of 6 weeks  -Orthopedic surgery consulted and appreciated -Status post left knee arthrocentesis and wash -Patient was noted to have few gram-positive cocci in fluid culture -Echocardiogram unremarkable for vegetation -Continue pain control -PT consulted recommending SNF versus a CIR (however patient not a candidate for inpt rehab at this time) -Ortho planning to remove sutures on Monday from L knee. Would like to have patient on CPM machine. Patient to follow up with Dr. August Saucer in one week (however, patient will still be admitted at that time)  Acute kidney injury -Creatinine was 3.5 on admission -Resolved, creatinine 0.69 -Monitor BMP  Normocytic anemia -hemoglobin currently 7.3 -Continue to monitor CBC, transfuse if hemoglobin drops below 7  Hyponatremia -Improved, Na 130 -continue to monitor BMP  Constipation -Continue bowel regimen  Polysubstance abuse -Admits to using cocaine prior to admission.  Refuses he is ever used IV heroin -UDS positive for benzodiazepines and cocaine  DVT Prophylaxis  lovenox  Code Status: Full  Family Communication: None at bedside  Disposition Plan: Admitted. Pending completion  of IV antibiotics - several more weeks  Consultants Neurosurgery Infectious disease Orthopedic surgery   Procedures  -5/14- left kneearthrocentesiswas done, synovial fluidgrew gram-positive cocci. -5/15 -left knee washout. -5/20-R shoulder debridement of multiple abscesses and fluid in [facility] joint.  Antibiotics   Anti-infectives (From admission, onward)   Start     Dose/Rate Route Frequency Ordered Stop   06/29/18 1400  vancomycin (VANCOCIN) IVPB 750 mg/150 ml premix     750 mg 150 mL/hr over 60 Minutes Intravenous Every 12 hours 06/29/18 1342     06/29/18 0000  vancomycin IVPB     750 mg Intravenous Every 12 hours 06/29/18 1407 08/09/18 2359   06/28/18 1735  gentamicin (GARAMYCIN) injection  Status:  Discontinued       As needed 06/28/18 1736 06/28/18 1838   06/28/18 1734  vancomycin (VANCOCIN) powder  Status:  Discontinued       As needed 06/28/18 1735 06/28/18 1838   06/26/18 1300  vancomycin (VANCOCIN) 1,250 mg in sodium chloride 0.9 % 250 mL IVPB  Status:  Discontinued     1,250 mg 166.7 mL/hr over 90 Minutes Intravenous Every 12 hours 06/26/18 1057 06/26/18 1136   06/26/18 1300  vancomycin (VANCOCIN) IVPB 1000 mg/200 mL premix  Status:  Discontinued     1,000 mg 200 mL/hr over 60 Minutes Intravenous Every 12 hours 06/26/18 1136 06/29/18 1342   06/26/18 0000  vancomycin IVPB  Status:  Discontinued     1,000 mg Intravenous Every 12 hours 06/26/18 1616 06/29/18    06/24/18 1200  vancomycin (VANCOCIN) 1,500 mg in sodium chloride 0.9 % 500 mL IVPB  Status:  Discontinued     1,500 mg 250 mL/hr over 120 Minutes Intravenous Every 24 hours 06/23/18 1040 06/26/18 1057   06/24/18 1000  cefTRIAXone (ROCEPHIN) 2 g in sodium chloride 0.9 % 100 mL IVPB  Status:  Discontinued     2 g 200 mL/hr over 30 Minutes Intravenous Every 24 hours 06/23/18 0857 06/24/18 1433   06/23/18 1848  vancomycin (VANCOCIN) powder  Status:  Discontinued       As needed 06/23/18 1848 06/23/18 1956   06/23/18 1847  gentamicin (GARAMYCIN) injection  Status:  Discontinued       As needed 06/23/18 1847 06/23/18 1956    06/23/18 0900  vancomycin (VANCOCIN) 1,500 mg in sodium chloride 0.9 % 500 mL IVPB     1,500 mg 250 mL/hr over 120 Minutes Intravenous  Once 06/23/18 0855 06/24/18 0814   06/23/18 0900  cefTRIAXone (ROCEPHIN) 1 g in sodium chloride 0.9 % 100 mL IVPB     1 g 200 mL/hr over 30 Minutes Intravenous  Once 06/23/18 0857 06/24/18 0814   06/20/18 0900  cefTRIAXone (ROCEPHIN) 1 g in sodium chloride 0.9 % 100 mL IVPB  Status:  Discontinued     1 g 200 mL/hr over 30 Minutes Intravenous Every 24 hours 06/20/18 0824 06/23/18 0857      Subjective:   Ollis Kamara seen and examined today.  No new complaints today. Still has some stiffness and soreness. Feels his is able to move his left leg more. Denies current chest pain, shortness of breath, abdominal pain, N/V/D/C, dizziness, headache.  Objective:   Vitals:   07/09/18 1956 07/09/18 2310 07/10/18 0338 07/10/18 0819  BP: 132/88 (!) 150/80 135/83 133/80  Pulse: (!) 108 (!) 106 (!) 104 (!) 109  Resp: 14 14 14 19   Temp: 98.5 F (36.9 C) 97.9 F (36.6 C) 98.7 F (37.1 C) 98  F (36.7 C)  TempSrc: Oral Oral Oral Oral  SpO2: 97% 99% 97% 98%  Weight:      Height:        Intake/Output Summary (Last 24 hours) at 07/10/2018 1019 Last data filed at 07/10/2018 0336 Gross per 24 hour  Intake 1785.48 ml  Output 1050 ml  Net 735.48 ml   Filed Weights   06/19/18 1303 06/23/18 1708 06/28/18 1543  Weight: 74.8 kg 74.8 kg 74.8 kg   Exam  General: Well developed, well nourished, NAD, appears stated age  HEENT: NCAT,mucous membranes moist.   Cardiovascular: S1 S2 auscultated, RRR  Respiratory: Clear to auscultation bilaterally with equal chest rise  Abdomen: Soft, nontender, nondistended, + bowel sounds  Extremities: warm dry without cyanosis clubbing or edema. R shoulder dressing in place. Left knee edema- improving   Neuro: AAOx3, nonfocal  Psych: Pleasant, appropriate mood and affect  Data Reviewed: I have personally reviewed following  labs and imaging studies  CBC: Recent Labs  Lab 07/04/18 0140 07/05/18 0822 07/06/18 0351 07/08/18 0329 07/10/18 0300  WBC 9.8 7.7 7.7  --  9.8  NEUTROABS 7.1  --   --   --  6.5  HGB 7.2* 7.5* 8.0* 7.5* 7.3*  HCT 21.8* 23.1* 24.8* 22.8* 22.5*  MCV 91.2 90.9 90.8  --  89.3  PLT 423* 469* 553*  --  799*   Basic Metabolic Panel: Recent Labs  Lab 07/04/18 0140 07/06/18 0351 07/08/18 0329 07/10/18 0300  NA 131* 132* 131* 130*  K 3.8 3.6 3.6 3.7  CL 92* 94* 94* 93*  CO2 29 29 28 27   GLUCOSE 107* 110* 112* 100*  BUN 25* 22* 16 15  CREATININE 0.69 0.75 0.75 0.69  CALCIUM 8.9 9.3 9.0 8.9   GFR: Estimated Creatinine Clearance: 110.3 mL/min (by C-G formula based on SCr of 0.69 mg/dL). Liver Function Tests: No results for input(s): AST, ALT, ALKPHOS, BILITOT, PROT, ALBUMIN in the last 168 hours. No results for input(s): LIPASE, AMYLASE in the last 168 hours. No results for input(s): AMMONIA in the last 168 hours. Coagulation Profile: No results for input(s): INR, PROTIME in the last 168 hours. Cardiac Enzymes: No results for input(s): CKTOTAL, CKMB, CKMBINDEX, TROPONINI in the last 168 hours. BNP (last 3 results) No results for input(s): PROBNP in the last 8760 hours. HbA1C: No results for input(s): HGBA1C in the last 72 hours. CBG: No results for input(s): GLUCAP in the last 168 hours. Lipid Profile: No results for input(s): CHOL, HDL, LDLCALC, TRIG, CHOLHDL, LDLDIRECT in the last 72 hours. Thyroid Function Tests: No results for input(s): TSH, T4TOTAL, FREET4, T3FREE, THYROIDAB in the last 72 hours. Anemia Panel: No results for input(s): VITAMINB12, FOLATE, FERRITIN, TIBC, IRON, RETICCTPCT in the last 72 hours. Urine analysis:    Component Value Date/Time   COLORURINE YELLOW 06/19/2018 1944   APPEARANCEUR HAZY (A) 06/19/2018 1944   LABSPEC 1.014 06/19/2018 1944   PHURINE 5.0 06/19/2018 1944   GLUCOSEU 50 (A) 06/19/2018 1944   HGBUR MODERATE (A) 06/19/2018 1944    BILIRUBINUR NEGATIVE 06/19/2018 1944   KETONESUR NEGATIVE 06/19/2018 1944   PROTEINUR NEGATIVE 06/19/2018 1944   NITRITE NEGATIVE 06/19/2018 1944   LEUKOCYTESUR NEGATIVE 06/19/2018 1944   Sepsis Labs: @LABRCNTIP (procalcitonin:4,lacticidven:4)  ) No results found for this or any previous visit (from the past 240 hour(s)).    Radiology Studies: No results found.   Scheduled Meds: . diphenhydrAMINE  25 mg Oral Once  . enoxaparin (LOVENOX) injection  40 mg Subcutaneous Daily  .  esomeprazole  20 mg Oral QAC breakfast  . folic acid  1 mg Oral Daily  . methocarbamol  500 mg Oral TID  . multivitamin with minerals  1 tablet Oral Daily  . polyethylene glycol  17 g Oral Daily  . senna-docusate  1 tablet Oral BID  . sodium chloride flush  10-40 mL Intracatheter Q12H  . sucralfate  1 g Oral TID WC & HS  . thiamine  100 mg Oral Daily   Or  . thiamine  100 mg Intravenous Daily  . traMADol  50 mg Oral Q6H   Continuous Infusions: . sodium chloride 20 mL/hr at 07/09/18 1658  . methocarbamol (ROBAXIN) IV    . vancomycin 750 mg (07/10/18 0249)     LOS: 20 days   Time Spent in minutes   20 minutes  Mayson Mcneish D.O. on 07/10/2018 at 10:19 AM  Between 7am to 7pm - Please see pager noted on [email or web address]  After 7pm go to [email or web address]  And look for the night coverage person covering for me after hours  [facility] Office  [phone]

## 2018-07-10 NOTE — Progress Notes (Signed)
Physical Therapy Treatment Patient Details Name: [name] MRN: [id number] DOB: 11/10/1965 Today's Date: 07/10/2018    History of Present Illness Pt. with PMH of hep C, substance abuse; admitted on 06/19/2018, presented with complaint of back pain, was found to have musculoskeletal back pain as well as right upper extremity cellulitis; Patient had a debridement of his shoulder perfromed on 5/20.     PT Comments    Patient able to transfer to a chair today with a mod a stand pivot transfer. He reported pain but was very happy to be in the chair. Once in the chair he reported he was comfortable. He was left with OT working on ADL;s and self care tasks. He was advised to call when PT needed to get him back.    Follow Up Recommendations  SNF;CIR     Equipment Recommendations  Other (comment)    Recommendations for Other Services Rehab consult     Precautions / Restrictions Precautions Precautions: Shoulder Type of Shoulder Precautions: Active protocol: PROM/AROM shoulder FF to 90, abduct to 60, ER to 30; verbal order per Dr. August Saucerean on 5/25 AROM elbow/wrist/hand as tolerated  Shoulder Interventions: Shoulder sling/immobilizer;For comfort Required Braces or Orthoses: Sling Restrictions Weight Bearing Restrictions: Yes RUE Weight Bearing: Non weight bearing LLE Weight Bearing: Weight bearing as tolerated    Mobility  Bed Mobility Overal bed mobility: Needs Assistance Bed Mobility: Supine to Sit       Sit to supine: Min assist;+2 for safety/equipment   General bed mobility comments: requires assist for L LE and increased time for trunk mobility; +2 safety   Transfers Overall transfer level: Needs assistance   Transfers: Squat Pivot Transfers     Squat pivot transfers: Mod assist;+2 safety/equipment     General transfer comment: Able to participate using right lower extremity to squat pivot to a chair. Reported mild pain.   Ambulation/Gait                  Stairs             Wheelchair Mobility    Modified Rankin (Stroke Patients Only)       Balance Overall balance assessment: Needs assistance Sitting-balance support: No upper extremity supported;Feet supported Sitting balance-Leahy Scale: Fair Sitting balance - Comments: able to maintain midline posture without UE support or cueing today   Standing balance support: Bilateral upper extremity supported Standing balance-Leahy Scale: Poor Standing balance comment: unable to come to full stand                             Cognition Arousal/Alertness: Awake/alert Behavior During Therapy: [facility]FL for tasks assessed/performed;Anxious Overall Cognitive Status: Within Functional Limits for tasks assessed                                 General Comments: Distracted by pain but better today       Exercises Correct positioning of sling/immobilizer: Minimal assistance    General Comments General comments (skin integrity, edema, etc.): pillow under foot  when sitting EOB       Pertinent Vitals/Pain Pain Assessment: Faces Faces Pain Scale: Hurts whole lot Pain Location: L knee/ low back Pain Descriptors / Indicators: Sore;Grimacing;Guarding Pain Intervention(s): Limited activity within patient's tolerance;Monitored during session;Patient requesting pain meds-RN notified    Home Living  Prior Function            PT Goals (current goals can now be found in the care plan section) Acute Rehab PT Goals Patient Stated Goal: To get to the commode  PT Goal Formulation: With patient Time For Goal Achievement: 07/05/18 Potential to Achieve Goals: Good Progress towards PT goals: Progressing toward goals    Frequency    Min 3X/week      PT Plan Current plan remains appropriate    Co-evaluation PT/OT/SLP Co-Evaluation/Treatment: Yes Reason for Co-Treatment: Complexity of the patient's impairments (multi-system  involvement)   OT goals addressed during session: ADL's and self-care      AM-PAC PT "6 Clicks" Mobility   Outcome Measure  Help needed turning from your back to your side while in a flat bed without using bedrails?: A Lot Help needed moving from lying on your back to sitting on the side of a flat bed without using bedrails?: A Lot Help needed moving to and from a bed to a chair (including a wheelchair)?: A Lot Help needed standing up from a chair using your arms (e.g., wheelchair or bedside chair)?: Total Help needed to walk in hospital room?: Total Help needed climbing 3-5 steps with a railing? : Total 6 Click Score: 9    End of Session Equipment Utilized During Treatment: Gait belt;Other (comment) Activity Tolerance: Patient limited by pain Patient left: with call bell/phone within reach;with SCD's reapplied;in chair Nurse Communication: Mobility status PT Visit Diagnosis: Unsteadiness on feet (R26.81);Muscle weakness (generalized) (M62.81);Pain;Other abnormalities of gait and mobility (R26.89) Pain - Right/Left: Right Pain - part of body: Arm     Time: 9794-8016 PT Time Calculation (min) (ACUTE ONLY): 17 min  Charges:  $Therapeutic Activity: 8-22 mins                       Dessie Coma PT DPT  07/10/2018, 2:09 PM

## 2018-07-11 LAB — HEMOGLOBIN AND HEMATOCRIT, BLOOD
HCT: 22.9 % — ABNORMAL LOW (ref 39.0–52.0)
Hemoglobin: 7.2 g/dL — ABNORMAL LOW (ref 13.0–17.0)

## 2018-07-11 NOTE — Progress Notes (Signed)
PROGRESS NOTE    [name]  YQM:[id number] DOB: 1965/03/05 DOA: 06/19/2018 PCP: Patient, No Pcp Per   Brief Narrative:  HPI on 06/19/2018 by Dr. Kennis Carina Nabil Chan is a 53 y.o. male with medical history significant for hepatitis C. Hx is obtained mostly from chart review as at the time of my evaluation patient is s/p Dilaudid and Ativan and unable to give me details of history, except to tell me he hurts all over and did not fall.  Patient presented to the ED with complaints of increasing back pain, radiating down his left leg, pain now going down both legs.  Patient has difficulty standing, or walking without assistance.  Pain started about after patient attempted to lift a lawnmower over a week ago.  Patient came to the ED May 5th, MRI showed severe foraminal narrowing at L5-S1, due to anterolisthesis and disc appeared worse on left.  Possible to follow-up with neurosurgery as outpatient, but he said he did not understand the instructions.  Patient was discharged on prednisone and Percocets. Patient denied vomiting and loose stools.  DAughter called that patient history of street drug use-cocaine.  Last use 2 to 3 weeks ago.  Interim history Was admitted for sepsis secondary to right upper extremity cellulitis and was started on IV antibiotics however cellulitis did not improve.  He did have CT scan which showed intrafascial abscesses in the right shoulder along with septic arthritis of the [facility] joint.  Patient had debridement of multiple abscesses.  During hospitalization patient began to have back symptoms and lower extremities weakness.  Patient did have MRI of the lumbar spine which did show discitis as well as septic arthritis and paraspinal abscesses.  During his hospitalization, he did have left knee swelling he did have arthrocentesis which showed gram-positive cocci and had left knee washout.  Patient does require 6 to 8 weeks of IV antibiotics per infectious disease.  Assessment & Plan   Sepsis secondary to cellulitis/right upper extremity abscesses/multiple lumbar abscesses and septic arthritis of the left knee -Presented with tachycardia and leukocytosis, back pain -MRI 5 5 showed severe lumbar spinal stenosis.  MRI 5/19 showed L4-S1 discitis, L4-L5 septic arthritis/multiple paraspinal abscesses -Patient was also noted to have left knee edema -CT right shoulder showed full intramuscular and intrafascial abscesses along with septic arthritis of the [facility] joint -Neurosurgery consulted and appreciated-commended MRI near the end of antibiotic regimen -Infectious disease consulted and appreciated, recommended IV antibiotics minimum of 6 weeks  -Orthopedic surgery consulted and appreciated -Status post left knee arthrocentesis and wash -Patient was noted to have few gram-positive cocci in fluid culture -Echocardiogram unremarkable for vegetation -Continue pain control -PT consulted recommending SNF versus a CIR (however patient not a candidate for inpt rehab at this time) -Ortho planning to remove sutures on Monday from L knee. Would like to have patient on CPM machine. Patient to follow up with Dr. August Saucer in one week (however, patient will still be admitted at that time)  Acute kidney injury -Creatinine was 3.5 on admission -Resolved, creatinine 0.69 -Monitor BMP  Normocytic anemia -hemoglobin currently 7.3 -Continue to monitor CBC, transfuse if hemoglobin drops below 7  Hyponatremia -Improved, Na 130 -continue to monitor BMP  Constipation -Continue bowel regimen  Polysubstance abuse -Admits to using cocaine prior to admission.  Refuses he is ever used IV heroin -UDS positive for benzodiazepines and cocaine  DVT Prophylaxis  lovenox  Code Status: Full  Family Communication: None at bedside  Disposition Plan: Admitted. Pending completion  of IV antibiotics - several more weeks  Consultants Neurosurgery Infectious disease Orthopedic surgery   Procedures  -5/14- left kneearthrocentesiswas done, synovial fluidgrew gram-positive cocci. -5/15 -left knee washout. -5/20-R shoulder debridement of multiple abscesses and [facility].  Antibiotics   Anti-infectives (From admission, onward)   Start     Dose/Rate Route Frequency Ordered Stop   06/29/18 1400  vancomycin (VANCOCIN) IVPB 750 mg/150 ml premix     750 mg 150 mL/hr over 60 Minutes Intravenous Every 12 hours 06/29/18 1342     06/29/18 0000  vancomycin IVPB     750 mg Intravenous Every 12 hours 06/29/18 1407 08/09/18 2359   06/28/18 1735  gentamicin (GARAMYCIN) injection  Status:  Discontinued       As needed 06/28/18 1736 06/28/18 1838   06/28/18 1734  vancomycin (VANCOCIN) powder  Status:  Discontinued       As needed 06/28/18 1735 06/28/18 1838   06/26/18 1300  vancomycin (VANCOCIN) 1,250 mg in sodium chloride 0.9 % 250 mL IVPB  Status:  Discontinued     1,250 mg 166.7 mL/hr over 90 Minutes Intravenous Every 12 hours 06/26/18 1057 06/26/18 1136   06/26/18 1300  vancomycin (VANCOCIN) IVPB 1000 mg/200 mL premix  Status:  Discontinued     1,000 mg 200 mL/hr over 60 Minutes Intravenous Every 12 hours 06/26/18 1136 06/29/18 1342   06/26/18 0000  vancomycin IVPB  Status:  Discontinued     1,000 mg Intravenous Every 12 hours 06/26/18 1616 06/29/18    06/24/18 1200  vancomycin (VANCOCIN) 1,500 mg in sodium chloride 0.9 % 500 mL IVPB  Status:  Discontinued     1,500 mg 250 mL/hr over 120 Minutes Intravenous Every 24 hours 06/23/18 1040 06/26/18 1057   06/24/18 1000  cefTRIAXone (ROCEPHIN) 2 g in sodium chloride 0.9 % 100 mL IVPB  Status:  Discontinued     2 g 200 mL/hr over 30 Minutes Intravenous Every 24 hours 06/23/18 0857 06/24/18 1433   06/23/18 1848  vancomycin (VANCOCIN) powder  Status:  Discontinued       As needed 06/23/18 1848 06/23/18 1956   06/23/18 1847  gentamicin (GARAMYCIN) injection  Status:  Discontinued       As needed 06/23/18 1847 06/23/18 1956    06/23/18 0900  vancomycin (VANCOCIN) 1,500 mg in sodium chloride 0.9 % 500 mL IVPB     1,500 mg 250 mL/hr over 120 Minutes Intravenous  Once 06/23/18 0855 06/24/18 0814   06/23/18 0900  cefTRIAXone (ROCEPHIN) 1 g in sodium chloride 0.9 % 100 mL IVPB     1 g 200 mL/hr over 30 Minutes Intravenous  Once 06/23/18 0857 06/24/18 0814   06/20/18 0900  cefTRIAXone (ROCEPHIN) 1 g in sodium chloride 0.9 % 100 mL IVPB  Status:  Discontinued     1 g 200 mL/hr over 30 Minutes Intravenous Every 24 hours 06/20/18 0824 06/23/18 0857      Subjective:   [name] seen and examined today.  Patient with no new complaints today.  States he is able to move his left leg more.  Denies current chest pain, shortness of breath, abdominal pain, nausea vomiting, diarrhea constipation, dizziness or headache.  Objective:   Vitals:   07/10/18 1929 07/10/18 2310 07/11/18 0337 07/11/18 0759  BP: 127/69 130/64 128/74 133/82  Pulse: (!) 106 (!) 105 (!) 101 (!) 110  Resp: 18 18 18 16   Temp: 98.9 F (37.2 C) 98.4 F (36.9 C) 98.4 F (36.9 C)  98 F (36.7 C)  TempSrc: Oral Oral Oral Oral  SpO2: 98% 100% 96% 97%  Weight:      Height:        Intake/Output Summary (Last 24 hours) at 07/11/2018 1103 Last data filed at 07/11/2018 1026 Gross per 24 hour  Intake -  Output 3600 ml  Net -3600 ml   Filed Weights   06/19/18 1303 06/23/18 1708 06/28/18 1543  Weight: 74.8 kg 74.8 kg 74.8 kg   Exam  General: Well developed, well nourished, NAD, appears stated age  HEENT: NCAT, mucous membranes moist.   Cardiovascular: S1 S2 auscultated,  RRR  Respiratory: Clear to auscultation bilaterally with equal chest rise  Abdomen: Soft, nontender, nondistended, + bowel sounds  Extremities: warm dry without cyanosis clubbing or edema.  Right shoulder dressing in place.  Left knee edema improving  Neuro: AAOx3, nonfocal  Psych: Pleasant, appropriate mood and affect  Data Reviewed: I have personally reviewed following  labs and imaging studies  CBC: Recent Labs  Lab 07/05/18 0822 07/06/18 0351 07/08/18 0329 07/10/18 0300  WBC 7.7 7.7  --  9.8  NEUTROABS  --   --   --  6.5  HGB 7.5* 8.0* 7.5* 7.3*  HCT 23.1* 24.8* 22.8* 22.5*  MCV 90.9 90.8  --  89.3  PLT 469* 553*  --  799*   Basic Metabolic Panel: Recent Labs  Lab 07/06/18 0351 07/08/18 0329 07/10/18 0300  NA 132* 131* 130*  K 3.6 3.6 3.7  CL 94* 94* 93*  CO2 GLUCOSE 110* 112* 100*  BUN 22* 16 15  CREATININE 0.75 0.75 0.69  CALCIUM 9.3 9.0 8.9   GFR: Estimated Creatinine Clearance: 110.3 mL/min (by C-G formula based on SCr of 0.69 mg/dL). Liver Function Tests: No results for input(s): AST, ALT, ALKPHOS, BILITOT, PROT, ALBUMIN in the last 168 hours. No results for input(s): LIPASE, AMYLASE in the last 168 hours. No results for input(s): AMMONIA in the last 168 hours. Coagulation Profile: No results for input(s): INR, PROTIME in the last 168 hours. Cardiac Enzymes: No results for input(s): CKTOTAL, CKMB, CKMBINDEX, TROPONINI in the last 168 hours. BNP (last 3 results) No results for input(s): PROBNP in the last 8760 hours. HbA1C: No results for input(s): HGBA1C in the last 72 hours. CBG: No results for input(s): GLUCAP in the last 168 hours. Lipid Profile: No results for input(s): CHOL, HDL, LDLCALC, TRIG, CHOLHDL, LDLDIRECT in the last 72 hours. Thyroid Function Tests: No results for input(s): TSH, T4TOTAL, FREET4, T3FREE, THYROIDAB in the last 72 hours. Anemia Panel: No results for input(s): VITAMINB12, FOLATE, FERRITIN, TIBC, IRON, RETICCTPCT in the last 72 hours. Urine analysis:    Component Value Date/Time   COLORURINE YELLOW 06/19/2018 1944   APPEARANCEUR HAZY (A) 06/19/2018 1944   LABSPEC 1.014 06/19/2018 1944   PHURINE 5.0 06/19/2018 1944   GLUCOSEU 50 (A) 06/19/2018 1944   HGBUR MODERATE (A) 06/19/2018 1944   BILIRUBINUR NEGATIVE 06/19/2018 1944   KETONESUR NEGATIVE 06/19/2018 1944   PROTEINUR  NEGATIVE 06/19/2018 1944   NITRITE NEGATIVE 06/19/2018 1944   LEUKOCYTESUR NEGATIVE 06/19/2018 1944   Sepsis Labs: (procalcitonin:4,lacticidven:4)  ) No results found for this or any previous visit (from the past 240 hour(s)).    Radiology Studies: No results found.   Scheduled Meds: . diphenhydrAMINE  25 mg Oral Once  . enoxaparin (LOVENOX) injection  40 mg Subcutaneous Daily  . esomeprazole  20 mg Oral QAC breakfast  . folic acid  1  mg Oral Daily  . methocarbamol  500 mg Oral TID  . multivitamin with minerals  1 tablet Oral Daily  . polyethylene glycol  17 g Oral Daily  . senna-docusate  1 tablet Oral BID  . sodium chloride flush  10-40 mL Intracatheter Q12H  . sucralfate  1 g Oral TID WC & HS  . thiamine  100 mg Oral Daily   Or  . thiamine  100 mg Intravenous Daily  . traMADol  50 mg Oral Q6H   Continuous Infusions: . sodium chloride 20 mL/hr at 07/09/18 1658  . methocarbamol (ROBAXIN) IV    . vancomycin 750 mg (07/11/18 0157)     LOS: 21 days   Time Spent in minutes   20 minutes  Blanche Gallien D.O. on 07/11/2018 at 11:03 AM  Between 7am to 7pm - Please see pager noted on [email or web address]  After 7pm go to [email or web address]  And look for the night coverage person covering for me after hours  [facility] Office  [phone]

## 2018-07-11 NOTE — Progress Notes (Signed)
Physical Therapy Treatment Patient Details Name: [name] MRN: [id number] DOB: 05/28/1965 Today's Date: 07/11/2018    History of Present Illness Pt. with PMH of hep C, substance abuse; admitted on 06/19/2018, presented with complaint of back pain, was found to have musculoskeletal back pain as well as right upper extremity cellulitis; Patient had a debridement of his shoulder perfromed on 5/20.     PT Comments    Patient was able to transfer to the commode and have a bowel movement. He required max a to transfer but was able to participate with his right LE> He still refused to put weight on the left LE. He is able to participate a little with the right UE but therapy made sure he did not put weight on in. Patient is making progress.    Follow Up Recommendations  SNF;CIR     Equipment Recommendations  Other (comment)    Recommendations for Other Services Rehab consult     Precautions / Restrictions Precautions Precautions: Shoulder Type of Shoulder Precautions: Active protocol: PROM/AROM shoulder FF to 90, abduct to 60, ER to 30; verbal order per Dr. August Saucerean on 5/25 AROM elbow/wrist/hand as tolerated  Shoulder Interventions: Shoulder sling/immobilizer;For comfort Restrictions Weight Bearing Restrictions: Yes RUE Weight Bearing: Non weight bearing LLE Weight Bearing: Weight bearing as tolerated    Mobility  Bed Mobility Overal bed mobility: Needs Assistance Bed Mobility: Supine to Sit     Supine to sit: Max assist;+2 for physical assistance Sit to supine: +2 for safety/equipment;Max assist   General bed mobility comments: Mod a to sit up and move left lE to the edge of bed. Max a to get back to the bed.   Transfers Overall transfer level: Needs assistance   Transfers: Squat Pivot Transfers     Squat pivot transfers: Max assist;+2 physical assistance     General transfer comment: max a squat pivot to and from the commode. Able to have a bowel movement.    Ambulation/Gait                 Stairs             Wheelchair Mobility    Modified Rankin (Stroke Patients Only)       Balance Overall balance assessment: Needs assistance Sitting-balance support: No upper extremity supported;Feet supported Sitting balance-Leahy Scale: Fair Sitting balance - Comments: able to maintain midline posture without UE support or cueing today   Standing balance support: Bilateral upper extremity supported Standing balance-Leahy Scale: Poor Standing balance comment: unable to come to full stand                             Cognition Arousal/Alertness: Awake/alert Behavior During Therapy: [facility] for tasks assessed/performed;Anxious Overall Cognitive Status: Within Functional Limits for tasks assessed                                 General Comments: Distracted by pain but better today       Exercises      General Comments General comments (skin integrity, edema, etc.): pillow under left foot       Pertinent Vitals/Pain Pain Assessment: Faces Faces Pain Scale: Hurts whole lot Pain Location: L knee/ low back Pain Descriptors / Indicators: Sore;Grimacing;Guarding    Home Living  Prior Function            PT Goals (current goals can now be found in the care plan section) Acute Rehab PT Goals PT Goal Formulation: With patient Time For Goal Achievement: 07/05/18 Potential to Achieve Goals: Good    Frequency    Min 3X/week      PT Plan Current plan remains appropriate    Co-evaluation              AM-PAC PT "6 Clicks" Mobility   Outcome Measure  Help needed turning from your back to your side while in a flat bed without using bedrails?: A Lot Help needed moving from lying on your back to sitting on the side of a flat bed without using bedrails?: A Lot Help needed moving to and from a bed to a chair (including a wheelchair)?: A Lot Help needed standing up  from a chair using your arms (e.g., wheelchair or bedside chair)?: Total Help needed to walk in hospital room?: Total Help needed climbing 3-5 steps with a railing? : Total 6 Click Score: 9    End of Session Equipment Utilized During Treatment: Gait belt;Other (comment) Activity Tolerance: Patient limited by pain Patient left: with call bell/phone within reach;with SCD's reapplied;in chair Nurse Communication: Mobility status PT Visit Diagnosis: Unsteadiness on feet (R26.81);Muscle weakness (generalized) (M62.81);Pain;Other abnormalities of gait and mobility (R26.89) Pain - Right/Left: Right Pain - part of body: Arm     Time: 1130-1203 PT Time Calculation (min) (ACUTE ONLY): 33 min  Charges:  $Therapeutic Activity: 23-37 mins                        Dessie Coma PT DPT  07/11/2018, 1:24 PM

## 2018-07-12 LAB — CBC
HCT: 21.9 % — ABNORMAL LOW (ref 39.0–52.0)
Hemoglobin: 7.1 g/dL — ABNORMAL LOW (ref 13.0–17.0)
MCH: 28.6 pg (ref 26.0–34.0)
MCHC: 32.4 g/dL (ref 30.0–36.0)
MCV: 88.3 fL (ref 80.0–100.0)
Platelets: 869 10*3/uL — ABNORMAL HIGH (ref 150–400)
RBC: 2.48 MIL/uL — ABNORMAL LOW (ref 4.22–5.81)
RDW: 16 % — ABNORMAL HIGH (ref 11.5–15.5)
WBC: 9.4 10*3/uL (ref 4.0–10.5)
nRBC: 0 % (ref 0.0–0.2)

## 2018-07-12 LAB — BASIC METABOLIC PANEL
Anion gap: 11 (ref 5–15)
BUN: 16 mg/dL (ref 6–20)
CO2: 29 mmol/L (ref 22–32)
Calcium: 9.1 mg/dL (ref 8.9–10.3)
Chloride: 93 mmol/L — ABNORMAL LOW (ref 98–111)
Creatinine, Ser: 0.69 mg/dL (ref 0.61–1.24)
GFR calc Af Amer: >60 mL/min (ref >60)
GFR calc non Af Amer: >60 mL/min (ref >60)
Glucose, Bld: 103 mg/dL — ABNORMAL HIGH (ref 70–99)
Potassium: 3.5 mmol/L (ref 3.5–5.1)
Sodium: 133 mmol/L — ABNORMAL LOW (ref 135–145)

## 2018-07-12 MED ORDER — DICLOFENAC SODIUM 1 % TD GEL
2.0000 g | Freq: Four times a day (QID) | TRANSDERMAL | Status: DC
  Administered 2018-07-12 – 2018-08-08 (×32): 2 g via TOPICAL
  Filled 2018-07-12: qty 100

## 2018-07-12 NOTE — Progress Notes (Signed)
Orthopedic Tech Progress Note Patient Details:  [name] 20-Mar-1965 [id number]  CPM Left Knee CPM Left Knee: On Left Knee Flexion (Degrees): 90 Left Knee Extension (Degrees): 6  Post Interventions Patient Tolerated: Well Instructions Provided: Care of device  [name] 07/12/2018, 4:51 PM

## 2018-07-12 NOTE — Progress Notes (Signed)
Occupational Therapy Treatment Patient Details Name: [name] MRN: [id number] DOB: 09/18/1965 Today's Date: 07/12/2018    History of present illness Pt. with PMH of hep C, substance abuse; admitted on 06/19/2018, presented with complaint of back pain, was found to have musculoskeletal back pain as well as right upper extremity cellulitis; Patient had a debridement of his shoulder perfromed on 5/20.    OT comments  Patient agreeable to PT/OT session.  Attempted LB dressing bed level, pt with decreased ranged of motion to B LEs to don requiring total assist- will benefit from AE education.  Patient requires +2 max assist for bed mobility and sit to stand from EOB, cueing to avoid WB through R UE. After session received verbal order from Dr. Felton Clintonean R UE WBAT.  Will follow and progress as able.     Follow Up Recommendations  Other (comment);Supervision/Assistance - 24 hour(post acute rehab CIR vs SNF )    Equipment Recommendations  Other (comment)(TBD)    Recommendations for Other Services Rehab consult    Precautions / Restrictions Precautions Precautions: Shoulder Type of Shoulder Precautions: Active protocol: PROM/AROM shoulder FF to 90, abduct to 60, ER to 30; verbal order per Dr. August Saucerean on 5/25 AROM elbow/wrist/hand as tolerated  Shoulder Interventions: Shoulder sling/immobilizer;For comfort Restrictions Weight Bearing Restrictions: Yes RUE Weight Bearing: Weight bearing as tolerated(verbal order per Dr. Bunnie Philipsean WBAT) LLE Weight Bearing: Weight bearing as tolerated       Mobility Bed Mobility Overal bed mobility: Needs Assistance Bed Mobility: Supine to Sit;Sit to Supine Rolling: Max assist   Supine to sit: Max assist;+2 for physical assistance Sit to supine: +2 for safety/equipment;Max assist   General bed mobility comments: max assist for mgmt of R LE off EOB and trunk support  Transfers Overall transfer level: Needs assistance Equipment used: Rolling walker (2  wheeled) Transfers: Sit to/from Stand Sit to Stand: Max assist;+2 physical assistance         General transfer comment: sit to stand from EOB with RW, able to tolerate approx 1 minute but constant cueing to avoid WB through R UE    Balance Overall balance assessment: Needs assistance Sitting-balance support: No upper extremity supported;Feet supported Sitting balance-Leahy Scale: Fair Sitting balance - Comments: static sitting balance with supervision   Standing balance support: Bilateral upper extremity supported Standing balance-Leahy Scale: Poor Standing balance comment: unable to come to full stand                            ADL either performed or assessed with clinical judgement   ADL Overall ADL's : Needs assistance/impaired                     Lower Body Dressing: Total assistance;Bed level Lower Body Dressing Details (indicate cue type and reason): attempted to don socks supine, limited ROM from supine to attempt donning                General ADL Comments: session focused on mobility progression, in preperation for ADL participation      Vision   Vision Assessment?: No apparent visual deficits   Perception     Praxis      Cognition Arousal/Alertness: Awake/alert Behavior During Therapy: WFL for tasks assessed/performed;Anxious Overall Cognitive Status: Within Functional Limits for tasks assessed  General Comments: Distracted by pain but better today         Exercises Exercises: General Upper Extremity;Shoulder   Shoulder Instructions       General Comments pillow under left leg. Likes left leg to be moved with a blanket     Pertinent Vitals/ Pain       Pain Assessment: Faces Faces Pain Scale: Hurts whole lot Pain Location: L knee/ low back Pain Descriptors / Indicators: Sore;Grimacing;Guarding  Home Living                                          Prior  Functioning/Environment              Frequency  Min 3X/week        Progress Toward Goals  OT Goals(current goals can now be found in the care plan section)  Progress towards OT goals: Progressing toward goals  Acute Rehab OT Goals Patient Stated Goal: to be able to stand  OT Goal Formulation: With patient  Plan Discharge plan remains appropriate;Frequency remains appropriate    Co-evaluation    PT/OT/SLP Co-Evaluation/Treatment: Yes Reason for Co-Treatment: Complexity of the patient's impairments (multi-system involvement);Necessary to address cognition/behavior during functional activity;For patient/therapist safety;To address functional/ADL transfers PT goals addressed during session: Mobility/safety with mobility;Balance;Proper use of DME;Strengthening/ROM OT goals addressed during session: ADL's and self-care      AM-PAC OT "6 Clicks" Daily Activity     Outcome Measure   Help from another person eating meals?: None Help from another person taking care of personal grooming?: A Little Help from another person toileting, which includes using toliet, bedpan, or urinal?: Total Help from another person bathing (including washing, rinsing, drying)?: A Lot Help from another person to put on and taking off regular upper body clothing?: A Little Help from another person to put on and taking off regular lower body clothing?: Total 6 Click Score: 14    End of Session Equipment Utilized During Treatment: Gait belt;Other (comment)(R UE sling )  OT Visit Diagnosis: Muscle weakness (generalized) (M62.81);Pain Pain - Right/Left: Right Pain - part of body: Shoulder;Leg   Activity Tolerance Patient tolerated treatment well   Patient Left in bed;with call bell/phone within reach;with bed alarm set   Nurse Communication Mobility status        Time: 1104-1130 OT Time Calculation (min): 26 min  Charges: OT General Charges $OT Visit: 1 Visit OT Treatments $Self Care/Home  Management : 8-22 mins  Chancy Milroy, OT Acute Rehabilitation Services Pager [phone] Office [phone]    Chancy Milroy 07/12/2018, 3:31 PM

## 2018-07-12 NOTE — Progress Notes (Signed)
Physical Therapy Treatment Patient Details Name: [name]: [id number] DOB: 05/01/1965 Today's Date: 07/12/2018    History of Present Illness Pt. with PMH of hep C, substance abuse; admitted on 06/19/2018, presented with complaint of back pain, was found to have musculoskeletal back pain as well as right upper extremity cellulitis; Patient had a debridement of his shoulder perfromed on 5/20.     PT Comments    Patient was abel to stand with a walker. He became syncopal and had to lie back down. The patient remains motivated. He continues to want his left leg on a pillow which effects his ability to put weight through it. Therapy will continue to progress as tolerated.    Follow Up Recommendations  SNF;CIR     Equipment Recommendations  Other (comment)    Recommendations for Other Services Rehab consult     Precautions / Restrictions Precautions Precautions: Shoulder Type of Shoulder Precautions: Active protocol: PROM/AROM shoulder FF to 90, abduct to 60, ER to 30; verbal order per Dr. August Saucerean on 5/25 AROM elbow/wrist/hand as tolerated  Shoulder Interventions: Shoulder sling/immobilizer;For comfort Restrictions Weight Bearing Restrictions: Yes RUE Weight Bearing: Weight bearing as tolerated(New verbal order from Dr Bunnie Philipsean WBAT ) LLE Weight Bearing: Weight bearing as tolerated    Mobility  Bed Mobility Overal bed mobility: Needs Assistance Bed Mobility: Supine to Sit Rolling: Max assist   Supine to sit: Max assist;+2 for physical assistance Sit to supine: +2 for safety/equipment;Max assist   General bed mobility comments: continues to require max a to the edge of the bed but time taken is improving   Transfers Overall transfer level: Needs assistance Equipment used: Rolling walker (2 wheeled) Transfers: Sit to/from Stand Sit to Stand: Max assist;+2 physical assistance         General transfer comment: Stood with a RW. Frewent cuing not to put weight on the right UE.  Able to stand for 1 munite. At that time he became syncopal. Patient put some weight on the L LE but refused to take it off a pillow.   Ambulation/Gait                 Stairs             Wheelchair Mobility    Modified Rankin (Stroke Patients Only)       Balance Overall balance assessment: Needs assistance Sitting-balance support: No upper extremity supported;Feet supported Sitting balance-Leahy Scale: Fair Sitting balance - Comments: able to maintain midline posture without UE support or cueing today   Standing balance support: Bilateral upper extremity supported Standing balance-Leahy Scale: Poor Standing balance comment: unable to come to full stand                             Cognition Arousal/Alertness: Awake/alert Behavior During Therapy: [facility] LLCWFL for tasks assessed/performed;Anxious Overall Cognitive Status: Within Functional Limits for tasks assessed                                 General Comments: Distracted by pain but better today       Exercises      General Comments General comments (skin integrity, edema, etc.): pillow under left leg. Likes left leg to be moved with a blanket       Pertinent Vitals/Pain Pain Assessment: Faces Faces Pain Scale: Hurts whole lot Pain Location: L knee/ low back Pain Descriptors /  Indicators: Sore;Grimacing;Guarding    Home Living                      Prior Function            PT Goals (current goals can now be found in the care plan section) Acute Rehab PT Goals PT Goal Formulation: With patient Time For Goal Achievement: 07/05/18 Potential to Achieve Goals: Good    Frequency    Min 3X/week      PT Plan Current plan remains appropriate    Co-evaluation PT/OT/SLP Co-Evaluation/Treatment: Yes Reason for Co-Treatment: Complexity of the patient's impairments (multi-system involvement);Necessary to address cognition/behavior during functional activity;For  patient/therapist safety;To address functional/ADL transfers PT goals addressed during session: Mobility/safety with mobility;Balance;Proper use of DME;Strengthening/ROM        AM-PAC PT "6 Clicks" Mobility   Outcome Measure  Help needed turning from your back to your side while in a flat bed without using bedrails?: A Lot Help needed moving from lying on your back to sitting on the side of a flat bed without using bedrails?: A Lot Help needed moving to and from a bed to a chair (including a wheelchair)?: A Lot Help needed standing up from a chair using your arms (e.g., wheelchair or bedside chair)?: Total Help needed to walk in hospital room?: Total Help needed climbing 3-5 steps with a railing? : Total 6 Click Score: 9    End of Session Equipment Utilized During Treatment: Gait belt;Other (comment) Activity Tolerance: Patient limited by pain Patient left: with call bell/phone within reach;with SCD's reapplied;in chair Nurse Communication: Mobility status PT Visit Diagnosis: Unsteadiness on feet (R26.81);Muscle weakness (generalized) (M62.81);Pain;Other abnormalities of gait and mobility (R26.89) Pain - Right/Left: Right Pain - part of body: Arm     Time: 1104-1130 PT Time Calculation (min) (ACUTE ONLY): 26 min  Charges:  $Therapeutic Activity: 23-37 mins                        Dessie Coma PT DPT  07/12/2018, 1:42 PM

## 2018-07-12 NOTE — Progress Notes (Signed)
PROGRESS NOTE    [name]  ZOX:[id number] DOB: 08/11/1965 DOA: 06/19/2018 PCP: Patient, No Pcp Per   Brief Narrative: 53 year old with past medical history significant for hepatitis C, who presented to the ED with complaints of increasing back pain, radiating down to the left leg initially but subsequently both legs.  Patient was having difficulty standing and walking.  Per patient the pain is started after he attempted to lift a lawnmower over a week ago.  Evaluation in the ED, MRI showed severe foraminal narrowing at level L5-S1.  Patient was advised to follow-up with neurosurgery as an outpatient, but he reported that he did not understand instruction.  Patient was discharged on prednisone and Percocet.    He subsequently was admitted with sepsis secondary to right upper extremity cellulitis and was a started on IV antibiotics, however cellulitis did not improve.  He did had a CT scan which showed intra-facial abscess in the right shoulder along with [facility].  Patient had debridement of multiple abscess.  During hospitalization patient began to have back pain and right thigh weakness.  MRI of the lumbar spine which showed discitis as well as septic arthritis and paraspinal abscess.  During hospitalization he did have left knee swelling, he had arthrocentesis which showed gram-positive cocci and had left knee washout. Patient was evaluated by ID who is recommending 6 to 8 weeks of IV antibiotics.     Assessment & Plan:   Principal Problem:   Septic arthritis of multiple joints (HCC) Active Problems:   Hepatitis C antibody test positive   Cigarette smoker   Intractable back pain   AKI (acute kidney injury) (HCC)   Spinal stenosis of lumbosacral region   GERD (gastroesophageal reflux disease)   Mild protein-calorie malnutrition (HCC)   Septic arthritis of knee, left (HCC)   Acute medial meniscal tear, left, initial encounter   Polysubstance abuse (HCC)  Normocytic anemia   Effusion of left elbow   Recurrent boils   Discitis of lumbosacral region   Psoas abscess, right (HCC)   Multiple abscesses of right shoulder   Chronic infection of left knee (HCC)   1-sepsis secondary to cellulitis of the right upper extremity multiple abscess large multiple lumbar abscess and septic arthritis of the left knee: -Patient present with tachycardia, leukocytosis and back pain. -MRI 5/5 showed severe lumbar spinal stenosis.  MRI 5/19 showed L4 S1 discitis, L4-L5 septic arthritis multiple paraspinal abscess. -CT right shoulder showed  intramuscular and infra facial abscess along with septic arthritis of the [facility] joint -Neurosurgery was consulted and  Recommended repeat MRI near the end of antibiotics. -ID consulted, recommend minimum of 6 weeks of IV antibiotics. -Orthopedic consulted.  Patient is status post left knee arthrocentesis and washout.  S/p irrigation and debridement of right shoulder, acromial medial clavicular joint irrigation and debridement on the right. -Echocardiogram was negative for vegetation. -PT consulted recommending SNF. -Dr. August Saucerean note from Sunday; plan to remove sutures on Monday.  Ortho will follow up on patient today to order CPM machine as well.  I have placed order to remove suture.  AKI: -Creatinine on admission was 3.5. -Solved with IV fluids.  Creatinine 0.6.  Normocytic anemia: hb stable. We will check anemia panel.  Hyponatremia: Improved.  With IV fluids.  Constipation: Continue with bowel regimen  Polysubstance abuse -Admits to using cocaine prior to admission.  Refuses he is ever used IV heroin -UDS positive for benzodiazepines and cocaine.  Estimated body mass index is  23.68 kg/m as calculated from the following:   Height as of this encounter: 5\' 10"  (1.778 m).   Weight as of this encounter: 74.8 kg.   DVT prophylaxis: Lovenox Code Status: full code Family Communication: care discussed with patient.   Disposition Plan: awaiting completion of IV antibiotics.    Consultants:  Neurosurgery Infectious disease Orthopedic surgery   Procedures: -5/14- left kneearthrocentesiswas done, synovial fluidgrew gram-positive cocci. -5/15 -left knee washout. -5/20-R shoulder debridement of multiple abscesses and fluid in [facility] joint.    Antimicrobials:    Subjective: He is still having back pain but improved since admission. He is able to move right LE better.  He is still complaining of left knee pain. Right shoulder pain has improved.   Objective: Vitals:   07/11/18 2310 07/12/18 0322 07/12/18 0843 07/12/18 1145  BP: 128/78 131/72 114/69 116/83  Pulse: (!) 101 97 (!) 110 (!) 107  Resp: 18 18 18 18   Temp: 98.3 F (36.8 C) 98 F (36.7 C) 98.1 F (36.7 C) 98.3 F (36.8 C)  TempSrc: Oral Oral Oral Oral  SpO2: 98% 98% 99% 97%  Weight:      Height:        Intake/Output Summary (Last 24 hours) at 07/12/2018 1321 Last data filed at 07/12/2018 0900 Gross per 24 hour  Intake 120 ml  Output 1700 ml  Net -1580 ml   Filed Weights   06/19/18 1303 06/23/18 1708 06/28/18 1543  Weight: 74.8 kg 74.8 kg 74.8 kg    Examination:  General exam: Appears calm and comfortable  Respiratory system: Clear to auscultation. Respiratory effort normal. Cardiovascular system: S1 & S2 heard, RRR. No JVD, murmurs, rubs, gallops or clicks. No pedal edema. Gastrointestinal system: Abdomen is nondistended, soft and nontender. No organomegaly or masses felt. Normal bowel sounds heard. Central nervous system: Alert and oriented. No focal neurological deficits. Extremities: right LE 4/5, left 5/5 left knee with suture, mild edema , no redness.  Skin: No rashes, lesions or ulcers    Data Reviewed: I have personally reviewed following labs and imaging studies  CBC: Recent Labs  Lab 07/06/18 0351 07/08/18 0329 07/10/18 0300 07/11/18 1130 07/12/18 0500  WBC 7.7  --  9.8  --  9.4  NEUTROABS  --    --  6.5  --   --   HGB 8.0* 7.5* 7.3* 7.2* 7.1*  HCT 24.8* 22.8* 22.5* 22.9* 21.9*  MCV 90.8  --  89.3  --  88.3  PLT 553*  --  799*  --  869*   Basic Metabolic Panel: Recent Labs  Lab 07/06/18 0351 07/08/18 0329 07/10/18 0300 07/12/18 0500  NA 132* 131* 130* 133*  K 3.6 3.6 3.7 3.5  CL 94* 94* 93* 93*  CO2 29 28 27 29   GLUCOSE 110* 112* 100* 103*  BUN 22* 16 15 16   CREATININE 0.75 0.75 0.69 0.69  CALCIUM 9.3 9.0 8.9 9.1   GFR: Estimated Creatinine Clearance: 110.3 mL/min (by C-G formula based on SCr of 0.69 mg/dL). Liver Function Tests: No results for input(s): AST, ALT, ALKPHOS, BILITOT, PROT, ALBUMIN in the last 168 hours. No results for input(s): LIPASE, AMYLASE in the last 168 hours. No results for input(s): AMMONIA in the last 168 hours. Coagulation Profile: No results for input(s): INR, PROTIME in the last 168 hours. Cardiac Enzymes: No results for input(s): CKTOTAL, CKMB, CKMBINDEX, TROPONINI in the last 168 hours. BNP (last 3 results) No results for input(s): PROBNP in the last 8760 hours. HbA1C:  No results for input(s): HGBA1C in the last 72 hours. CBG: No results for input(s): GLUCAP in the last 168 hours. Lipid Profile: No results for input(s): CHOL, HDL, LDLCALC, TRIG, CHOLHDL, LDLDIRECT in the last 72 hours. Thyroid Function Tests: No results for input(s): TSH, T4TOTAL, FREET4, T3FREE, THYROIDAB in the last 72 hours. Anemia Panel: No results for input(s): VITAMINB12, FOLATE, FERRITIN, TIBC, IRON, RETICCTPCT in the last 72 hours. Sepsis Labs: No results for input(s): PROCALCITON, LATICACIDVEN in the last 168 hours.  No results found for this or any previous visit (from the past 240 hour(s)).       Radiology Studies: No results found.      Scheduled Meds: . diclofenac sodium  2 g Topical QID  . diphenhydrAMINE  25 mg Oral Once  . enoxaparin (LOVENOX) injection  40 mg Subcutaneous Daily  . esomeprazole  20 mg Oral QAC breakfast  . folic  acid  1 mg Oral Daily  . methocarbamol  500 mg Oral TID  . multivitamin with minerals  1 tablet Oral Daily  . polyethylene glycol  17 g Oral Daily  . senna-docusate  1 tablet Oral BID  . sodium chloride flush  10-40 mL Intracatheter Q12H  . sucralfate  1 g Oral TID WC & HS  . thiamine  100 mg Oral Daily   Or  . thiamine  100 mg Intravenous Daily  . traMADol  50 mg Oral Q6H   Continuous Infusions: . sodium chloride 20 mL/hr at 07/09/18 1658  . methocarbamol (ROBAXIN) IV    . vancomycin 750 mg (07/12/18 0137)     LOS: 22 days    Time spent: 35 minutes.    Alba Cory, MD [facility] Pager [phone]  If 7PM-7AM, please contact night-coverage [email or web address] Password [email or web address] 07/12/2018, 1:21 PM

## 2018-07-12 NOTE — Progress Notes (Signed)
Orthopedic Tech Progress Note Patient Details:  [name] November 17, 1965 [id number]  CPM Left Knee CPM Left Knee: Off Left Knee Flexion (Degrees): 90 Left Knee Extension (Degrees): 6  Post Interventions Patient Tolerated: Well Instructions Provided: Care of device  [name] 07/12/2018, 4:59 PM

## 2018-07-13 LAB — CBC WITH DIFFERENTIAL/PLATELET
Abs Immature Granulocytes: 0.46 10*3/uL — ABNORMAL HIGH (ref 0.00–0.07)
Basophils Absolute: 0.1 10*3/uL (ref 0.0–0.1)
Basophils Relative: 1 %
Eosinophils Absolute: 0.3 10*3/uL (ref 0.0–0.5)
Eosinophils Relative: 3 %
HCT: 22.7 % — ABNORMAL LOW (ref 39.0–52.0)
Hemoglobin: 7.1 g/dL — ABNORMAL LOW (ref 13.0–17.0)
Immature Granulocytes: 5 %
Lymphocytes Relative: 23 %
Lymphs Abs: 2.3 10*3/uL (ref 0.7–4.0)
MCH: 27.7 pg (ref 26.0–34.0)
MCHC: 31.3 g/dL (ref 30.0–36.0)
MCV: 88.7 fL (ref 80.0–100.0)
Monocytes Absolute: 0.7 10*3/uL (ref 0.1–1.0)
Monocytes Relative: 7 %
Neutro Abs: 6.2 10*3/uL (ref 1.7–7.7)
Neutrophils Relative %: 61 %
Platelets: 853 10*3/uL — ABNORMAL HIGH (ref 150–400)
RBC: 2.56 MIL/uL — ABNORMAL LOW (ref 4.22–5.81)
RDW: 16.4 % — ABNORMAL HIGH (ref 11.5–15.5)
WBC: 10 10*3/uL (ref 4.0–10.5)
nRBC: 0 % (ref 0.0–0.2)

## 2018-07-13 LAB — BASIC METABOLIC PANEL
Anion gap: 11 (ref 5–15)
BUN: 18 mg/dL (ref 6–20)
CO2: 28 mmol/L (ref 22–32)
Calcium: 9.2 mg/dL (ref 8.9–10.3)
Chloride: 93 mmol/L — ABNORMAL LOW (ref 98–111)
Creatinine, Ser: 0.67 mg/dL (ref 0.61–1.24)
GFR calc Af Amer: >60 mL/min (ref >60)
GFR calc non Af Amer: >60 mL/min (ref >60)
Glucose, Bld: 103 mg/dL — ABNORMAL HIGH (ref 70–99)
Potassium: 3.8 mmol/L (ref 3.5–5.1)
Sodium: 132 mmol/L — ABNORMAL LOW (ref 135–145)

## 2018-07-13 LAB — IRON AND TIBC
Iron: 12 ug/dL — ABNORMAL LOW (ref 45–182)
Saturation Ratios: 6 % — ABNORMAL LOW (ref 17.9–39.5)
TIBC: 203 ug/dL — ABNORMAL LOW (ref 250–450)
UIBC: 191 ug/dL

## 2018-07-13 LAB — RETICULOCYTES
Immature Retic Fract: 29.8 % — ABNORMAL HIGH (ref 2.3–15.9)
RBC.: 2.52 MIL/uL — ABNORMAL LOW (ref 4.22–5.81)
Retic Count, Absolute: 78.9 10*3/uL (ref 19.0–186.0)
Retic Ct Pct: 3.1 % (ref 0.4–3.1)

## 2018-07-13 LAB — FERRITIN: Ferritin: 444 ng/mL — ABNORMAL HIGH (ref 24–336)

## 2018-07-13 LAB — VITAMIN B12: Vitamin B-12: 439 pg/mL (ref 180–914)

## 2018-07-13 LAB — FOLATE: Folate: 12.7 ng/mL (ref >5.9)

## 2018-07-13 MED ORDER — VITAMIN B-12 100 MCG PO TABS
100.0000 ug | ORAL_TABLET | Freq: Every day | ORAL | Status: DC
  Administered 2018-07-13 – 2018-08-10 (×29): 100 ug via ORAL
  Filled 2018-07-13 (×29): qty 1

## 2018-07-13 MED ORDER — FERROUS SULFATE 325 (65 FE) MG PO TABS
325.0000 mg | ORAL_TABLET | Freq: Two times a day (BID) | ORAL | Status: DC
  Administered 2018-07-14 – 2018-08-10 (×53): 325 mg via ORAL
  Filled 2018-07-13 (×56): qty 1

## 2018-07-13 MED ORDER — MORPHINE SULFATE (PF) 2 MG/ML IV SOLN
2.0000 mg | INTRAVENOUS | Status: DC | PRN
  Administered 2018-07-13 – 2018-08-09 (×41): 2 mg via INTRAVENOUS
  Filled 2018-07-13 (×43): qty 1

## 2018-07-13 NOTE — Plan of Care (Signed)
Progressing towards goals

## 2018-07-13 NOTE — Progress Notes (Signed)
Orthopedic Tech Progress Note Patient Details:  [name] [birth year] [id number]  CPM Left Knee CPM Left Knee: On Left Knee Flexion (Degrees): 50 Left Knee Extension (Degrees): 6 Additional Comments: patient can not tolerate flexion  Post Interventions Patient Tolerated: Well Instructions Provided: Care of device  [name] 07/13/2018, 5:58 PM

## 2018-07-13 NOTE — Progress Notes (Signed)
CSW sent email on patient's behalf to inform the courts that he will not be able to attend his court date next week due to hospital stay.  Blenda Nicely, [place] Clinical Social Worker [phone]

## 2018-07-13 NOTE — Progress Notes (Signed)
Orthopedic Tech Progress Note Patient Details:  [name] [birth year] [id number]  CPM Left Knee CPM Left Knee: Off Left Knee Flexion (Degrees): 90 Left Knee Extension (Degrees): 6 Additional Comments: patient can not tolerate flexion  Post Interventions Patient Tolerated: Poor Instructions Provided: Care of device  [name] 07/13/2018, 8:58 AM

## 2018-07-13 NOTE — Progress Notes (Signed)
PROGRESS NOTE    [name]  [id number]:[id number] DOB: 11-Jun-1965 DOA: 06/19/2018 PCP: Patient, No Pcp Per   Brief Narrative: 53 year old with past medical history significant for hepatitis C, who presented to the ED with complaints of increasing back pain, radiating down to the left leg initially but subsequently both legs.  Patient was having difficulty standing and walking.  Per patient the pain is started after he attempted to lift a lawnmower over a week ago.  Evaluation in the ED, MRI showed severe foraminal narrowing at level L5-S1.  Patient was advised to follow-up with neurosurgery as an outpatient, but he reported that he did not understand instruction.  Patient was discharged on prednisone and Percocet.    He subsequently was admitted with sepsis secondary to right upper extremity cellulitis and was a started on IV antibiotics, however cellulitis did not improve.  He did had a CT scan which showed intra-facial abscess in the right shoulder along with septic arthritis of the [facility] joint.  Patient had debridement of multiple abscess.  During hospitalization patient began to have back pain and right thigh weakness.  MRI of the lumbar spine which showed discitis as well as septic arthritis and paraspinal abscess.  During hospitalization he did have left knee swelling, he had arthrocentesis which showed gram-positive cocci and had left knee washout. Patient was evaluated by ID who is recommending 6 to 8 weeks of IV antibiotics.   Assessment & Plan:   Principal Problem:   Septic arthritis of multiple joints (HCC) Active Problems:   Hepatitis C antibody test positive   Cigarette smoker   Intractable back pain   AKI (acute kidney injury) (HCC)   Spinal stenosis of lumbosacral region   GERD (gastroesophageal reflux disease)   Mild protein-calorie malnutrition (HCC)   Septic arthritis of knee, left (HCC)   Acute medial meniscal tear, left, initial encounter   Polysubstance abuse (HCC)  Normocytic anemia   Effusion of left elbow   Recurrent boils   Discitis of lumbosacral region   Psoas abscess, right (HCC)   Multiple abscesses of right shoulder   Chronic infection of left knee (HCC)   1-Sepsis secondary to cellulitis of the right upper extremity multiple abscess large multiple lumbar abscess and septic arthritis of the left knee: -Patient present with tachycardia, leukocytosis and back pain. -MRI 5/5 showed severe lumbar spinal stenosis.  MRI 5/19 showed L4 S1 discitis, L4-L5 septic arthritis multiple paraspinal abscess. -CT right shoulder showed  intramuscular and infra facial abscess along with septic arthritis of the [facility] joint -Neurosurgery was consulted and  Recommended repeat MRI near the end of antibiotics. -ID consulted, recommend minimum of 6 weeks of IV antibiotics. -Orthopedic consulted.  Patient is status post left knee arthrocentesis and washout.  S/p irrigation and debridement of right shoulder, acromial medial clavicular joint irrigation and debridement on the right. -Echocardiogram was negative for vegetation. -PT consulted recommending SNF. -suture left knee removed. CPM machine ordered. Added low dose morphine prior patient using CPM machine.   AKI: -Creatinine on admission was 3.5. -Solved with IV fluids.  Creatinine 0.6.  Normocytic anemia: hb stable. Iron deficiency anemia.  Start ferrous sulfate and B12 supplement.  Advised him to follow up with PCP for colonoscopy.   Hyponatremia: Improved.  With IV fluids.  Constipation: Continue with bowel regimen  Polysubstance abuse -Admits to using cocaine prior to admission.  Refuses he is ever used IV heroin -UDS positive for benzodiazepines and cocaine.  Estimated body mass index is 23.68 kg/m  as calculated from the following:   Height as of this encounter: 5\' 10"  (1.778 m).   Weight as of this encounter: 74.8 kg.   DVT prophylaxis: Lovenox Code Status: full code Family Communication: care  discussed with patient.  Disposition Plan: awaiting completion of IV antibiotics.    Consultants:  Neurosurgery Infectious disease Orthopedic surgery   Procedures: -5/14- left kneearthrocentesiswas done, synovial fluidgrew gram-positive cocci. -5/15 -left knee washout. -5/20-R shoulder debridement of multiple abscesses and fluid in [facility] joint.    Antimicrobials:    Subjective: He is complaining of left knee pain , asking for meds prior using CPM machine  Objective: Vitals:   07/12/18 2022 07/12/18 2313 07/13/18 0311 07/13/18 0730  BP: 121/73 128/73 129/77 132/75  Pulse: (!) 107 (!) 115 (!) 106 (!) 106  Resp: 18 18 16 18   Temp: 98.6 F (37 C) 98.8 F (37.1 C) 98 F (36.7 C) 98.3 F (36.8 C)  TempSrc: Oral Oral Oral Oral  SpO2: 99% 98% 98% 97%  Weight:      Height:        Intake/Output Summary (Last 24 hours) at 07/13/2018 0918 Last data filed at 07/12/2018 1800 Gross per 24 hour  Intake -  Output 1000 ml  Net -1000 ml   Filed Weights   06/19/18 1303 06/23/18 1708 06/28/18 1543  Weight: 74.8 kg 74.8 kg 74.8 kg    Examination:  General exam: NAD Respiratory system: CTA Cardiovascular system: S 1, S 2 RRR Gastrointestinal system: BS present, soft, nt Central nervous system: non focal.  Extremities: right LE 4/5 Skin: No rashes, lesions or ulcers    Data Reviewed: I have personally reviewed following labs and imaging studies  CBC: Recent Labs  Lab 07/08/18 0329 07/10/18 0300 07/11/18 1130 07/12/18 0500 07/13/18 0553  WBC  --  9.8  --  9.4 10.0  NEUTROABS  --  6.5  --   --  6.2  HGB 7.5* 7.3* 7.2* 7.1* 7.1*  HCT 22.8* 22.5* 22.9* 21.9* 22.7*  MCV  --  89.3  --  88.3 88.7  PLT  --  799*  --  869* 853*   Basic Metabolic Panel: Recent Labs  Lab 07/08/18 0329 07/10/18 0300 07/12/18 0500 07/13/18 0551  NA 131* 130* 133* 132*  K 3.6 3.7 3.5 3.8  CL 94* 93* 93* 93*  CO2 28 27 29 28   GLUCOSE 112* 100* 103* 103*  BUN 16 15 16 18    CREATININE 0.75 0.69 0.69 0.67  CALCIUM 9.0 8.9 9.1 9.2   GFR: Estimated Creatinine Clearance: 110.3 mL/min (by C-G formula based on SCr of 0.67 mg/dL). Liver Function Tests: No results for input(s): AST, ALT, ALKPHOS, BILITOT, PROT, ALBUMIN in the last 168 hours. No results for input(s): LIPASE, AMYLASE in the last 168 hours. No results for input(s): AMMONIA in the last 168 hours. Coagulation Profile: No results for input(s): INR, PROTIME in the last 168 hours. Cardiac Enzymes: No results for input(s): CKTOTAL, CKMB, CKMBINDEX, TROPONINI in the last 168 hours. BNP (last 3 results) No results for input(s): PROBNP in the last 8760 hours. HbA1C: No results for input(s): HGBA1C in the last 72 hours. CBG: No results for input(s): GLUCAP in the last 168 hours. Lipid Profile: No results for input(s): CHOL, HDL, LDLCALC, TRIG, CHOLHDL, LDLDIRECT in the last 72 hours. Thyroid Function Tests: No results for input(s): TSH, T4TOTAL, FREET4, T3FREE, THYROIDAB in the last 72 hours. Anemia Panel: Recent Labs    07/13/18 0551  VITAMINB12 439  FOLATE 12.7  FERRITIN 444*  TIBC 203*  IRON 12*  RETICCTPCT 3.1   Sepsis Labs: No results for input(s): PROCALCITON, LATICACIDVEN in the last 168 hours.  No results found for this or any previous visit (from the past 240 hour(s)).       Radiology Studies: No results found.      Scheduled Meds: . diclofenac sodium  2 g Topical QID  . diphenhydrAMINE  25 mg Oral Once  . enoxaparin (LOVENOX) injection  40 mg Subcutaneous Daily  . esomeprazole  20 mg Oral QAC breakfast  . ferrous sulfate  325 mg Oral BID WC  . folic acid  1 mg Oral Daily  . methocarbamol  500 mg Oral TID  . multivitamin with minerals  1 tablet Oral Daily  . polyethylene glycol  17 g Oral Daily  . senna-docusate  1 tablet Oral BID  . sodium chloride flush  10-40 mL Intracatheter Q12H  . sucralfate  1 g Oral TID WC & HS  . thiamine  100 mg Oral Daily   Or  . thiamine   100 mg Intravenous Daily  . traMADol  50 mg Oral Q6H  . vitamin B-12  100 mcg Oral Daily   Continuous Infusions: . sodium chloride 10 mL/hr at 07/12/18 1710  . methocarbamol (ROBAXIN) IV    . vancomycin 750 mg (07/13/18 0217)     LOS: 23 days    Time spent: 35 minutes.    Alba CoryBelkys A Shaletta Hinostroza, MD [facility] Pager [phone]  If 7PM-7AM, please contact night-coverage [email or web address] Password [email or web address] 07/13/2018, 9:18 AM

## 2018-07-13 NOTE — Progress Notes (Signed)
Orthopedic Tech Progress Note Patient Details:  [name] May 01, 1965 [id number]  CPM Left Knee CPM Left Knee: On Left Knee Flexion (Degrees): 90 Left Knee Extension (Degrees): 6  Post Interventions Patient Tolerated: Well Instructions Provided: Care of device  [name] 07/13/2018, 8:55 AM

## 2018-07-14 LAB — VANCOMYCIN, TROUGH: Vancomycin Tr: 11 ug/mL — ABNORMAL LOW (ref 15–20)

## 2018-07-14 NOTE — Progress Notes (Signed)
Occupational Therapy Treatment Patient Details Name: [name] MRN: [id number] DOB: 1965-12-27 Today's Date: 07/14/2018    History of present illness Pt. with PMH of hep C, substance abuse; admitted on 06/19/2018, presented with complaint of back pain, was found to have musculoskeletal back pain as well as right upper extremity cellulitis; Patient had a debridement of his shoulder perfromed on 5/20.    OT comments  Pt. Seen with PT for skilled therapy session with focus on bed mobility and increasing standing tolerance and technique in preparation for transfers.  Pt. Able to tolerate sitting eob and standing better this session.  Agreeable to attempt sitting eob for meals to increase his tolerance for physical activity. PT plans to review with nursing later today.    Follow Up Recommendations  Other (comment);Supervision/Assistance - 24 hour    Equipment Recommendations  Other (comment)    Recommendations for Other Services Rehab consult    Precautions / Restrictions Precautions Precautions: Shoulder Type of Shoulder Precautions: Active protocol: PROM/AROM shoulder FF to 90, abduct to 60, ER to 30; verbal order per Dr. August Saucer on 5/25 AROM elbow/wrist/hand as tolerated  Shoulder Interventions: Shoulder sling/immobilizer;For comfort Required Braces or Orthoses: Sling Restrictions RUE Weight Bearing: Weight bearing as tolerated LLE Weight Bearing: Weight bearing as tolerated       Mobility Bed Mobility Overal bed mobility: Needs Assistance Bed Mobility: Supine to Sit;Sit to Supine     Supine to sit: Max assist;+2 for physical assistance Sit to supine: +2 for safety/equipment;Max assist   General bed mobility comments: max assist for mgmt of L LE off EOB and trunk support, instructed pt. to use B UES to guide LLE out of bed but pt. resisted and would not attempt, also encouraged use of R UE as he is now allowed to bear weight, continued resistance to integrating new  techniques  Transfers Overall transfer level: Needs assistance Equipment used: Rolling walker (2 wheeled) Transfers: Sit to/from Stand Sit to Stand: Max assist;+2 physical assistance         General transfer comment: better standing tolerance today than from previous sessions.  c/o dizziness and elevated HR cont. but was able to follow cues for posture and hand placement to ease standing with great results.  encouraged pt. to sit eob for meals. he was responsive to this suggestion and agreed to try.  PT to return at lunch time to be present when nursing attempts eob.     Balance                                           ADL either performed or assessed with clinical judgement   ADL                                         General ADL Comments: session focused on mobility progression, in preperation for ADL participation      Vision       Perception     Praxis      Cognition Arousal/Alertness: Awake/alert Behavior During Therapy: WFL for tasks assessed/performed Overall Cognitive Status: Within Functional Limits for tasks assessed  Exercises     Shoulder Instructions       General Comments      Pertinent Vitals/ Pain       Pain Assessment: Faces Pain Score: 10-Worst pain ever Pain Location: L knee/ low back Pain Descriptors / Indicators: Sore;Grimacing;Guarding  Home Living                                          Prior Functioning/Environment              Frequency  Min 3X/week        Progress Toward Goals  OT Goals(current goals can now be found in the care plan section)  Progress towards OT goals: Progressing toward goals     Plan Discharge plan remains appropriate;Frequency remains appropriate    Co-evaluation    PT/OT/SLP Co-Evaluation/Treatment: Yes Reason for Co-Treatment: Complexity of the patient's impairments  (multi-system involvement);Necessary to address cognition/behavior during functional activity;For patient/therapist safety;To address functional/ADL transfers   OT goals addressed during session: ADL's and self-care      AM-PAC OT "6 Clicks" Daily Activity     Outcome Measure   Help from another person eating meals?: None Help from another person taking care of personal grooming?: A Little Help from another person toileting, which includes using toliet, bedpan, or urinal?: Total Help from another person bathing (including washing, rinsing, drying)?: A Lot Help from another person to put on and taking off regular upper body clothing?: A Little Help from another person to put on and taking off regular lower body clothing?: Total 6 Click Score: 14    End of Session Equipment Utilized During Treatment: Gait belt;Rolling walker  OT Visit Diagnosis: Muscle weakness (generalized) (M62.81);Pain Pain - Right/Left: Right Pain - part of body: Shoulder;Leg   Activity Tolerance Patient tolerated treatment well   Patient Left in bed;with call bell/phone within reach   Nurse Communication Other (comment)(plans for eob 3x a day for meals)        Time: [id number] OT Time Calculation (min): 25 min  Charges: OT General Charges $OT Visit: 1 Visit OT Treatments $Self Care/Home Management : 8-22 mins   Robet LeuMorris, Jerra Huckeby Lorraine, COTA/L 07/14/2018, 10:54 AM

## 2018-07-14 NOTE — Progress Notes (Signed)
PROGRESS NOTE    [name]  PGF:[id number] DOB: 1965-11-02 DOA: 06/19/2018 PCP: Patient, No Pcp Per   Brief Narrative: 53 year old with past medical history significant for hepatitis C, who presented to the ED with complaints of increasing back pain, radiating down to the left leg initially but subsequently both legs.  Patient was having difficulty standing and walking.  Per patient the pain is started after he attempted to lift a lawnmower over a week ago.  Evaluation in the ED, MRI showed severe foraminal narrowing at level L5-S1.  Patient was advised to follow-up with neurosurgery as an outpatient, but he reported that he did not understand instruction.  Patient was discharged on prednisone and Percocet.    He subsequently was admitted with sepsis secondary to right upper extremity cellulitis and was a started on IV antibiotics, however cellulitis did not improve.  He did had a CT scan which showed intra-facial abscess in the right shoulder along with septic arthritis of the [facility] joint.  Patient had debridement of multiple abscess.  During hospitalization patient began to have back pain and right thigh weakness.  MRI of the lumbar spine which showed discitis as well as septic arthritis and paraspinal abscess.  During hospitalization he did have left knee swelling, he had arthrocentesis which showed gram-positive cocci and had left knee washout. Patient was evaluated by ID who is recommending 6 to 8 weeks of IV antibiotics.   Assessment & Plan:   Principal Problem:   Septic arthritis of multiple joints (HCC) Active Problems:   Hepatitis C antibody test positive   Cigarette smoker   Intractable back pain   AKI (acute kidney injury) (HCC)   Spinal stenosis of lumbosacral region   GERD (gastroesophageal reflux disease)   Mild protein-calorie malnutrition (HCC)   Septic arthritis of knee, left (HCC)   Acute medial meniscal tear, left, initial encounter   Polysubstance abuse (HCC)  Normocytic anemia   Effusion of left elbow   Recurrent boils   Discitis of lumbosacral region   Psoas abscess, right (HCC)   Multiple abscesses of right shoulder   Chronic infection of left knee (HCC)   1-Sepsis secondary to cellulitis of the right upper extremity multiple abscess large multiple lumbar abscess and septic arthritis of the left knee: -Patient present with tachycardia, leukocytosis and back pain. -MRI 5/5 showed severe lumbar spinal stenosis.  MRI 5/19 showed L4 S1 discitis, L4-L5 septic arthritis multiple paraspinal abscess. -CT right shoulder showed  intramuscular and infra facial abscess along with septic arthritis of the [facility] joint -Neurosurgery was consulted and  Recommended repeat MRI near the end of antibiotics. -ID consulted, recommend minimum of 6 weeks of IV antibiotics. -Orthopedic consulted.  Patient is status post left knee arthrocentesis and washout.  S/p irrigation and debridement of right shoulder, acromial medial clavicular joint irrigation and debridement on the right. -Echocardiogram was negative for vegetation. -PT consulted recommending SNF. -suture left knee removed. CPM machine ordered. Added low dose morphine prior patient using CPM machine.  Tolerating CPM machine.   AKI: -Creatinine on admission was 3.5. -Solved with IV fluids.  Creatinine 0.6.  Normocytic anemia: hb stable. Iron deficiency anemia.  Start ferrous sulfate and B12 supplement.  Advised him to follow up with PCP for colonoscopy.  Check CBC tomorrow.   Hyponatremia: Improved with IV fluids.   Constipation: Continue with bowel regimen  Polysubstance abuse -Admits to using cocaine prior to admission.  Refuses he is ever used IV heroin -UDS positive for benzodiazepines and cocaine.  Estimated body mass index is 23.68 kg/m as calculated from the following:   Height as of this encounter: 5\' 10"  (1.778 m).   Weight as of this encounter: 74.8 kg.   DVT prophylaxis: Lovenox Code  Status: full code Family Communication: care discussed with patient.  Disposition Plan: awaiting completion of IV antibiotics.    Consultants:  Neurosurgery Infectious disease Orthopedic surgery   Procedures: -5/14- left kneearthrocentesiswas done, synovial fluidgrew gram-positive cocci. -5/15 -left knee washout. -5/20-R shoulder debridement of multiple abscesses and [facility].    Antimicrobials:    Subjective: He is tolerating CPM machine exercise with IV morphine Objective: Vitals:   07/13/18 2342 07/14/18 0456 07/14/18 0749 07/14/18 1154  BP: 134/78 (!) 153/86 140/77 118/67  Pulse: (!) 101 (!) 105 (!) 107 (!) 104  Resp: 16 18 16 18   Temp: 98.3 F (36.8 C)   98.4 F (36.9 C)  TempSrc: Oral   Oral  SpO2: 98% 97% 98% 97%  Weight:      Height:        Intake/Output Summary (Last 24 hours) at 07/14/2018 1251 Last data filed at 07/14/2018 0800 Gross per 24 hour  Intake 550 ml  Output 3000 ml  Net -2450 ml   Filed Weights   06/19/18 1303 06/23/18 1708 06/28/18 1543  Weight: 74.8 kg 74.8 kg 74.8 kg    Examination:  General exam: NAD Respiratory system: CTA Cardiovascular system: S 1, S 2 RRR Gastrointestinal system: BS present, soft, nt Central nervous system: Non focal.  Extremities: right LE 4/5. Left knee with mild edema Skin: No rashes, lesions or ulcers    Data Reviewed: I have personally reviewed following labs and imaging studies  CBC: Recent Labs  Lab 07/08/18 0329 07/10/18 0300 07/11/18 1130 07/12/18 0500 07/13/18 0553  WBC  --  9.8  --  9.4 10.0  NEUTROABS  --  6.5  --   --  6.2  HGB 7.5* 7.3* 7.2* 7.1* 7.1*  HCT 22.8* 22.5* 22.9* 21.9* 22.7*  MCV  --  89.3  --  88.3 88.7  PLT  --  799*  --  869* 853*   Basic Metabolic Panel: Recent Labs  Lab 07/08/18 0329 07/10/18 0300 07/12/18 0500 07/13/18 0551  NA 131* 130* 133* 132*  K 3.6 3.7 3.5 3.8  CL 94* 93* 93* 93*  CO2 28 27 29 28   GLUCOSE 112* 100* 103* 103*  BUN  16 15 16 18   CREATININE 0.75 0.69 0.69 0.67  CALCIUM 9.0 8.9 9.1 9.2   GFR: Estimated Creatinine Clearance: 110.3 mL/min (by C-G formula based on SCr of 0.67 mg/dL). Liver Function Tests: No results for input(s): AST, ALT, ALKPHOS, BILITOT, PROT, ALBUMIN in the last 168 hours. No results for input(s): LIPASE, AMYLASE in the last 168 hours. No results for input(s): AMMONIA in the last 168 hours. Coagulation Profile: No results for input(s): INR, PROTIME in the last 168 hours. Cardiac Enzymes: No results for input(s): CKTOTAL, CKMB, CKMBINDEX, TROPONINI in the last 168 hours. BNP (last 3 results) No results for input(s): PROBNP in the last 8760 hours. HbA1C: No results for input(s): HGBA1C in the last 72 hours. CBG: No results for input(s): GLUCAP in the last 168 hours. Lipid Profile: No results for input(s): CHOL, HDL, LDLCALC, TRIG, CHOLHDL, LDLDIRECT in the last 72 hours. Thyroid Function Tests: No results for input(s): TSH, T4TOTAL, FREET4, T3FREE, THYROIDAB in the last 72 hours. Anemia Panel: Recent Labs    07/13/18 0551  VITAMINB12 439  FOLATE 12.7  FERRITIN 444*  TIBC 203*  IRON 12*  RETICCTPCT 3.1   Sepsis Labs: No results for input(s): PROCALCITON, LATICACIDVEN in the last 168 hours.  No results found for this or any previous visit (from the past 240 hour(s)).       Radiology Studies: No results found.      Scheduled Meds: . diclofenac sodium  2 g Topical QID  . diphenhydrAMINE  25 mg Oral Once  . enoxaparin (LOVENOX) injection  40 mg Subcutaneous Daily  . esomeprazole  20 mg Oral QAC breakfast  . ferrous sulfate  325 mg Oral BID WC  . folic acid  1 mg Oral Daily  . methocarbamol  500 mg Oral TID  . multivitamin with minerals  1 tablet Oral Daily  . polyethylene glycol  17 g Oral Daily  . senna-docusate  1 tablet Oral BID  . sodium chloride flush  10-40 mL Intracatheter Q12H  . sucralfate  1 g Oral TID WC & HS  . thiamine  100 mg Oral Daily   Or   . thiamine  100 mg Intravenous Daily  . traMADol  50 mg Oral Q6H  . vitamin B-12  100 mcg Oral Daily   Continuous Infusions: . sodium chloride 10 mL/hr at 07/13/18 2348  . methocarbamol (ROBAXIN) IV    . vancomycin 750 mg (07/14/18 0139)     LOS: 24 days    Time spent: 35 minutes.    Alba Cory, MD [facility] Pager [phone]  If 7PM-7AM, please contact night-coverage [email or web address] Password [email or web address] 07/14/2018, 12:51 PM

## 2018-07-14 NOTE — Plan of Care (Signed)
Progressing towards goals

## 2018-07-14 NOTE — Progress Notes (Signed)
Orthopedic Tech Progress Note Patient Details:  [name] [birth year] [id number]  CPM Left Knee CPM Left Knee: On Left Knee Flexion (Degrees): 40 Left Knee Extension (Degrees): 6 Additional Comments: patient can not tolerate flexion  Post Interventions Patient Tolerated: Well Instructions Provided: Care of device, Adjustment of device  [name] 07/14/2018, 5:39 PM

## 2018-07-14 NOTE — Progress Notes (Signed)
Orthopedic Tech Progress Note Patient Details:  [name] Dec 03, 1965 [id number]  CPM Left Knee CPM Left Knee: Off Left Knee Flexion (Degrees): 40 Left Knee Extension (Degrees): 6 Additional Comments: patient can not tolerate flexion  Post Interventions Patient Tolerated: Well Instructions Provided: Care of device, Adjustment of device  [name] 07/14/2018, 6:58 PM

## 2018-07-14 NOTE — Progress Notes (Signed)
Physical Therapy Treatment Patient Details Name: [name] MRN: [id number] DOB: 12/27/1965 Today's Date: 07/14/2018    History of Present Illness Pt. with PMH of hep C, substance abuse; admitted on 06/19/2018, presented with complaint of back pain, was found to have musculoskeletal back pain as well as right upper extremity cellulitis; Patient had a debridement of his shoulder perfromed on 5/20.     PT Comments    Patient was able to stand with less assistance today and had less syncope. He continues to be very particular about how he moves. Therapy will work with nursing later to help him sit up on the edge of the bed for his lunch.  He was able to stand for a minute and sat up on the edge of the bed for > 15 minutes. He continues to report some syncope   Follow Up Recommendations  SNF;CIR     Equipment Recommendations  Other (comment)    Recommendations for Other Services Rehab consult     Precautions / Restrictions Precautions Precautions: Shoulder Type of Shoulder Precautions: Active protocol: PROM/AROM shoulder FF to 90, abduct to 60, ER to 30; verbal order per Dr. August Saucerean on 5/25 AROM elbow/wrist/hand as tolerated  Shoulder Interventions: Shoulder sling/immobilizer;For comfort Required Braces or Orthoses: Sling Restrictions RUE Weight Bearing: Weight bearing as tolerated LLE Weight Bearing: Weight bearing as tolerated    Mobility  Bed Mobility Overal bed mobility: Needs Assistance Bed Mobility: Supine to Sit;Sit to Supine     Supine to sit: Max assist;+2 for physical assistance Sit to supine: +2 for safety/equipment;Max assist   General bed mobility comments: max assist for mgmt of L LE off EOB and trunk support, instructed pt. to use B UES to guide LLE out of bed but pt. resisted and would not attempt, also encouraged use of R UE as he is now allowed to bear weight, continued resistance to integrating new techniques. Needs things to be done in a certain sequnece evven  though some of these things dont need to be done.   Transfers Overall transfer level: Needs assistance Equipment used: Rolling walker (2 wheeled) Transfers: Sit to/from Stand Sit to Stand: Max assist;+2 physical assistance         General transfer comment: Improved ability to stand today. Less posterior push. Less syncope. Did note increased HR. Pateitn declined to stand a second time   Ambulation/Gait                 Social research officer, governmenttairs             Wheelchair Mobility    Modified Rankin (Stroke Patients Only)       Balance Overall balance assessment: Needs assistance Sitting-balance support: No upper extremity supported;Feet supported Sitting balance-Leahy Scale: Fair Sitting balance - Comments: static sitting balance with supervision   Standing balance support: Bilateral upper extremity supported Standing balance-Leahy Scale: Poor Standing balance comment: unable to come to full stand                             Cognition Arousal/Alertness: Awake/alert Behavior During Therapy: WFL for tasks assessed/performed Overall Cognitive Status: Within Functional Limits for tasks assessed                                        Exercises      General Comments  Pertinent Vitals/Pain Pain Assessment: Faces Pain Score: 10-Worst pain ever Faces Pain Scale: Hurts whole lot Pain Location: L knee/ low back Pain Descriptors / Indicators: Sore;Grimacing;Guarding    Home Living                      Prior Function            PT Goals (current goals can now be found in the care plan section) Acute Rehab PT Goals Patient Stated Goal: to be able to stand  PT Goal Formulation: With patient Time For Goal Achievement: 07/05/18 Potential to Achieve Goals: Good Progress towards PT goals: Progressing toward goals    Frequency    Min 3X/week      PT Plan Current plan remains appropriate    Co-evaluation PT/OT/SLP  Co-Evaluation/Treatment: Yes Reason for Co-Treatment: Complexity of the patient's impairments (multi-system involvement);Necessary to address cognition/behavior during functional activity;For patient/therapist safety;To address functional/ADL transfers PT goals addressed during session: Mobility/safety with mobility;Balance;Proper use of DME;Strengthening/ROM OT goals addressed during session: ADL's and self-care      AM-PAC PT "6 Clicks" Mobility   Outcome Measure  Help needed turning from your back to your side while in a flat bed without using bedrails?: A Lot Help needed moving from lying on your back to sitting on the side of a flat bed without using bedrails?: A Lot Help needed moving to and from a bed to a chair (including a wheelchair)?: A Lot Help needed standing up from a chair using your arms (e.g., wheelchair or bedside chair)?: A Lot Help needed to walk in hospital room?: Total Help needed climbing 3-5 steps with a railing? : Total 6 Click Score: 10    End of Session Equipment Utilized During Treatment: Gait belt;Other (comment) Activity Tolerance: Patient limited by pain Patient left: with call bell/phone within reach;with SCD's reapplied;in chair Nurse Communication: Mobility status PT Visit Diagnosis: Unsteadiness on feet (R26.81);Muscle weakness (generalized) (M62.81);Pain;Other abnormalities of gait and mobility (R26.89) Pain - Right/Left: Right Pain - part of body: Arm     Time: 9604-5409 PT Time Calculation (min) (ACUTE ONLY): 25 min  Charges:  $Therapeutic Activity: 8-22 mins                       Dessie Coma PT DPT  07/14/2018, 11:59 AM

## 2018-07-15 LAB — CBC
HCT: 24 % — ABNORMAL LOW (ref 39.0–52.0)
Hemoglobin: 7.6 g/dL — ABNORMAL LOW (ref 13.0–17.0)
MCH: 27.8 pg (ref 26.0–34.0)
MCHC: 31.7 g/dL (ref 30.0–36.0)
MCV: 87.9 fL (ref 80.0–100.0)
Platelets: 787 10*3/uL — ABNORMAL HIGH (ref 150–400)
RBC: 2.73 MIL/uL — ABNORMAL LOW (ref 4.22–5.81)
RDW: 16.9 % — ABNORMAL HIGH (ref 11.5–15.5)
WBC: 11.5 10*3/uL — ABNORMAL HIGH (ref 4.0–10.5)
nRBC: 0 % (ref 0.0–0.2)

## 2018-07-15 LAB — VANCOMYCIN, PEAK: Vancomycin Pk: 24 ug/mL — ABNORMAL LOW (ref 30–40)

## 2018-07-15 MED ORDER — ENSURE ENLIVE PO LIQD
237.0000 mL | Freq: Three times a day (TID) | ORAL | Status: DC
  Administered 2018-07-15 – 2018-08-10 (×63): 237 mL via ORAL
  Filled 2018-07-15: qty 237

## 2018-07-15 NOTE — Plan of Care (Signed)
Progressing towards goals

## 2018-07-15 NOTE — Progress Notes (Signed)
Pharmacy Antibiotic Note  [name] is a 53 y.o. male admitted on 06/19/2018 with back pain, now with septic arthritis of multiple joints. Pharmacy has been consulted for vancomycin dosing. Patient is s/p I&D of R shoulder and the [facility] joint on 5/20.  Vancomycin levels demonstrate therapeutic AUC,  Cr has remained stable, planning for 6 weeks of ABX per ID. OPAT has been entered, no changes for now.   6/6 levels:  VP 24, VT 11, AUC 521  - no change AUC within goal range  Plan: -Continue vancomycin 750mg  IV q12h -Monitor renal function, vancomycin levels as needed  Height: 5\' 10"  (177.8 cm) Weight: 165 lb (74.8 kg) IBW/kg (Calculated) : 73  Temp (24hrs), Avg:98.3 F (36.8 C), Min:97.6 F (36.4 C), Max:98.6 F (37 C)  Recent Labs  Lab 07/08/18 1332 07/10/18 0300 07/12/18 0500 07/13/18 0551 07/13/18 0553 07/14/18 1400 07/15/18 0358 07/15/18 0744  WBC  --  9.8 9.4  --  10.0  --   --  11.5*  CREATININE  --  0.69 0.69 0.67  --   --   --   --   VANCOTROUGH 12*  --   --   --   --  11*  --   --   VANCOPEAK  --   --   --   --   --   --  24*  --     Estimated Creatinine Clearance: 110.3 mL/min (by C-G formula based on SCr of 0.67 mg/dL).    No Known Allergies   Vanc 5/15 >> CTX 5/12 >> 5/16  5/11 covid - negative 5/12 BCx - negative 5/14 synovial fluid L knee - GPC on stain >> negative 5/15 synovial fluid L knee - GPC on stain >> negative 5/20 BCx: neg 5/20 posterior shoulder abcess - neg 5/20 AC fluid joint - neg  Harrietta Guardian, PharmD PGY1 Pharmacy Resident 07/15/2018    11:14 AM Please check AMION for all [facility] numbers

## 2018-07-15 NOTE — Progress Notes (Signed)
PROGRESS NOTE    Carollee MassedJames Vidana  WUJ:[id number] DOB: 06/15/1965 DOA: 06/19/2018 PCP: Patient, No Pcp Per   Brief Narrative: 53 year old with past medical history significant for hepatitis C, who presented to the ED with complaints of increasing back pain, radiating down to the left leg initially but subsequently both legs.  Patient was having difficulty standing and walking.  Per patient the pain is started after he attempted to lift a lawnmower over a week ago.  Evaluation in the ED, MRI showed severe foraminal narrowing at level L5-S1.  Patient was advised to follow-up with neurosurgery as an outpatient, but he reported that he did not understand instruction.  Patient was discharged on prednisone and Percocet.    He subsequently was admitted with sepsis secondary to right upper extremity cellulitis and was a started on IV antibiotics, however cellulitis did not improve.  He did had a CT scan which showed intra-facial abscess in the right shoulder along with [facility].  Patient had debridement of multiple abscess.  During hospitalization patient began to have back pain and right thigh weakness.  MRI of the lumbar spine which showed discitis as well as septic arthritis and paraspinal abscess.  During hospitalization he did have left knee swelling, he had arthrocentesis which showed gram-positive cocci and had left knee washout. Patient was evaluated by ID who is recommending 6 to 8 weeks of IV antibiotics.   Assessment & Plan:   Principal Problem:   Septic arthritis of multiple joints (HCC) Active Problems:   Hepatitis C antibody test positive   Cigarette smoker   Intractable back pain   AKI (acute kidney injury) (HCC)   Spinal stenosis of lumbosacral region   GERD (gastroesophageal reflux disease)   Mild protein-calorie malnutrition (HCC)   Septic arthritis of knee, left (HCC)   Acute medial meniscal tear, left, initial encounter   Polysubstance abuse (HCC)  Normocytic anemia   Effusion of left elbow   Recurrent boils   Discitis of lumbosacral region   Psoas abscess, right (HCC)   Multiple abscesses of right shoulder   Chronic infection of left knee (HCC)   1-Sepsis secondary to cellulitis of the right upper extremity multiple abscess large multiple lumbar abscess and septic arthritis of the left knee: -Patient present with tachycardia, leukocytosis and back pain. -MRI 5/5 showed severe lumbar spinal stenosis.  MRI 5/19 showed L4 S1 discitis, L4-L5 septic arthritis multiple paraspinal abscess. -CT right shoulder showed  intramuscular and infra facial abscess along with septic [facility] -Neurosurgery was consulted and  Recommended repeat MRI near the end of antibiotics. -ID consulted, recommend minimum of 6 weeks of IV antibiotics. -Orthopedic consulted.  Patient is status post left knee arthrocentesis and washout.  S/p irrigation and debridement of right shoulder, acromial medial clavicular joint irrigation and debridement on the right. -Echocardiogram was negative for vegetation. -PT consulted recommending SNF. -suture left knee removed. CPM machine ordered. Added low dose morphine prior patient using CPM machine.  Tolerating CPM machine.  Continue with IV antibiotics.   AKI: -Creatinine on admission was 3.5. -Solved with IV fluids.  Creatinine 0.6.  Normocytic anemia: hb stable. Iron deficiency anemia.  Start ferrous sulfate and B12 supplement.  Advised him to follow up with PCP for colonoscopy.  Hb increasing today at 7.6----from 7  Hyponatremia: Improved with IV fluids.   Constipation: Continue with bowel regimen  Polysubstance abuse -Admits to using cocaine prior to admission.  Refuses he is ever used IV  heroin -UDS positive for benzodiazepines and cocaine.  Nutrition; start ensure  Estimated body mass index is 23.68 kg/m as calculated from the following:   Height as of this encounter: 5\' 10"  (1.778 m).    Weight as of this encounter: 74.8 kg.   DVT prophylaxis: Lovenox Code Status: full code Family Communication: care discussed with patient.  Disposition Plan: awaiting completion of IV antibiotics.    Consultants:  Neurosurgery Infectious disease Orthopedic surgery   Procedures: -5/14- left kneearthrocentesiswas done, synovial fluidgrew gram-positive cocci. -5/15 -left knee washout. -5/20-R shoulder debridement of multiple abscesses and fluid in [facility] joint.    Antimicrobials:    Subjective: He has been able to move his knee better. Pain is controlled   Objective: Vitals:   07/14/18 2012 07/15/18 0005 07/15/18 0331 07/15/18 0722  BP: (!) 141/81 (!) 149/79 140/76 125/77  Pulse: (!) 107 (!) 108 (!) 101 (!) 103  Resp: 17 18 17 16   Temp: 98.3 F (36.8 C) 98.6 F (37 C) 98.2 F (36.8 C) 97.6 F (36.4 C)  TempSrc: Oral Oral Oral Oral  SpO2: 98% 97% 98% 98%  Weight:      Height:        Intake/Output Summary (Last 24 hours) at 07/15/2018 1135 Last data filed at 07/15/2018 1000 Gross per 24 hour  Intake 1314 ml  Output 1600 ml  Net -286 ml   Filed Weights   06/19/18 1303 06/23/18 1708 06/28/18 1543  Weight: 74.8 kg 74.8 kg 74.8 kg    Examination:  General exam: NAD Respiratory system: CTA Cardiovascular system: S 1, S 2 RRR Gastrointestinal system: BS present,s oft, nt Central nervous system: non focal.  Extremities: right LE 4/5. Left knee with edema Skin: No rashes, lesions or ulcers    Data Reviewed: I have personally reviewed following labs and imaging studies  CBC: Recent Labs  Lab 07/10/18 0300 07/11/18 1130 07/12/18 0500 07/13/18 0553 07/15/18 0744  WBC 9.8  --  9.4 10.0 11.5*  NEUTROABS 6.5  --   --  6.2  --   HGB 7.3* 7.2* 7.1* 7.1* 7.6*  HCT 22.5* 22.9* 21.9* 22.7* 24.0*  MCV 89.3  --  88.3 88.7 87.9  PLT 799*  --  869* 853* 716*   Basic Metabolic Panel: Recent Labs  Lab 07/10/18 0300 07/12/18 0500 07/13/18 0551  NA 130*  133* 132*  K 3.7 3.5 3.8  CL 93* 93* 93*  CO2 27 29 28   GLUCOSE 100* 103* 103*  BUN 15 16 18   CREATININE 0.69 0.69 0.67  CALCIUM 8.9 9.1 9.2   GFR: Estimated Creatinine Clearance: 110.3 mL/min (by C-G formula based on SCr of 0.67 mg/dL). Liver Function Tests: No results for input(s): AST, ALT, ALKPHOS, BILITOT, PROT, ALBUMIN in the last 168 hours. No results for input(s): LIPASE, AMYLASE in the last 168 hours. No results for input(s): AMMONIA in the last 168 hours. Coagulation Profile: No results for input(s): INR, PROTIME in the last 168 hours. Cardiac Enzymes: No results for input(s): CKTOTAL, CKMB, CKMBINDEX, TROPONINI in the last 168 hours. BNP (last 3 results) No results for input(s): PROBNP in the last 8760 hours. HbA1C: No results for input(s): HGBA1C in the last 72 hours. CBG: No results for input(s): GLUCAP in the last 168 hours. Lipid Profile: No results for input(s): CHOL, HDL, LDLCALC, TRIG, CHOLHDL, LDLDIRECT in the last 72 hours. Thyroid Function Tests: No results for input(s): TSH, T4TOTAL, FREET4, T3FREE, THYROIDAB in the last 72 hours. Anemia Panel: Recent Labs  07/13/18 0551  VITAMINB12 439  FOLATE 12.7  FERRITIN 444*  TIBC 203*  IRON 12*  RETICCTPCT 3.1   Sepsis Labs: No results for input(s): PROCALCITON, LATICACIDVEN in the last 168 hours.  No results found for this or any previous visit (from the past 240 hour(s)).       Radiology Studies: No results found.      Scheduled Meds: . diclofenac sodium  2 g Topical QID  . enoxaparin (LOVENOX) injection  40 mg Subcutaneous Daily  . esomeprazole  20 mg Oral QAC breakfast  . feeding supplement (ENSURE ENLIVE)  237 mL Oral TID BM  . ferrous sulfate  325 mg Oral BID WC  . folic acid  1 mg Oral Daily  . methocarbamol  500 mg Oral TID  . multivitamin with minerals  1 tablet Oral Daily  . polyethylene glycol  17 g Oral Daily  . senna-docusate  1 tablet Oral BID  . sodium chloride flush   10-40 mL Intracatheter Q12H  . sucralfate  1 g Oral TID WC & HS  . thiamine  100 mg Oral Daily   Or  . thiamine  100 mg Intravenous Daily  . traMADol  50 mg Oral Q6H  . vitamin B-12  100 mcg Oral Daily   Continuous Infusions: . sodium chloride 10 mL/hr at 07/13/18 2348  . methocarbamol (ROBAXIN) IV    . vancomycin 750 mg (07/15/18 0202)     LOS: 25 days    Time spent: 35 minutes.    Alba CoryBelkys A Cordelle Dahmen, MD [facility] Pager [phone]  If 7PM-7AM, please contact night-coverage [email or web address] Password [email or web address] 07/15/2018, 11:35 AM

## 2018-07-16 LAB — BASIC METABOLIC PANEL WITH GFR
Anion gap: 11 (ref 5–15)
BUN: 19 mg/dL (ref 6–20)
CO2: 28 mmol/L (ref 22–32)
Calcium: 9.4 mg/dL (ref 8.9–10.3)
Chloride: 94 mmol/L — ABNORMAL LOW (ref 98–111)
Creatinine, Ser: 0.7 mg/dL (ref 0.61–1.24)
GFR calc Af Amer: >60 mL/min (ref >60)
GFR calc non Af Amer: >60 mL/min (ref >60)
Glucose, Bld: 103 mg/dL — ABNORMAL HIGH (ref 70–99)
Potassium: 3.9 mmol/L (ref 3.5–5.1)
Sodium: 133 mmol/L — ABNORMAL LOW (ref 135–145)

## 2018-07-16 LAB — CBC WITH DIFFERENTIAL/PLATELET
Abs Immature Granulocytes: 0.59 K/uL — ABNORMAL HIGH (ref 0.00–0.07)
Basophils Absolute: 0.1 K/uL (ref 0.0–0.1)
Basophils Relative: 1 %
Eosinophils Absolute: 0.2 K/uL (ref 0.0–0.5)
Eosinophils Relative: 1 %
HCT: 25.5 % — ABNORMAL LOW (ref 39.0–52.0)
Hemoglobin: 8.1 g/dL — ABNORMAL LOW (ref 13.0–17.0)
Immature Granulocytes: 5 %
Lymphocytes Relative: 24 %
Lymphs Abs: 2.9 K/uL (ref 0.7–4.0)
MCH: 27.7 pg (ref 26.0–34.0)
MCHC: 31.8 g/dL (ref 30.0–36.0)
MCV: 87.3 fL (ref 80.0–100.0)
Monocytes Absolute: 0.9 K/uL (ref 0.1–1.0)
Monocytes Relative: 7 %
Neutro Abs: 7.5 K/uL (ref 1.7–7.7)
Neutrophils Relative %: 62 %
Platelets: 786 K/uL — ABNORMAL HIGH (ref 150–400)
RBC: 2.92 MIL/uL — ABNORMAL LOW (ref 4.22–5.81)
RDW: 16.8 % — ABNORMAL HIGH (ref 11.5–15.5)
WBC: 12.1 K/uL — ABNORMAL HIGH (ref 4.0–10.5)
nRBC: 0 % (ref 0.0–0.2)

## 2018-07-16 NOTE — Progress Notes (Signed)
PROGRESS NOTE    [name]  ZRA:[id number] DOB: 04/12/1965 DOA: 06/19/2018 PCP: Patient, No Pcp Per   Brief Narrative: 53 year old with past medical history significant for hepatitis C, who presented to the ED with complaints of increasing back pain, radiating down to the left leg initially but subsequently both legs.  Patient was having difficulty standing and walking.  Per patient the pain is started after he attempted to lift a lawnmower over a week ago.  Evaluation in the ED, MRI showed severe foraminal narrowing at level L5-S1.  Patient was advised to follow-up with neurosurgery as an outpatient, but he reported that he did not understand instruction.  Patient was discharged on prednisone and Percocet.    He subsequently was admitted with sepsis secondary to right upper extremity cellulitis and was a started on IV antibiotics, however cellulitis did not improve.  He did had a CT scan which showed intra-facial abscess in the right shoulder along with septic arthritis of the [facility] joint.  Patient had debridement of multiple abscess.  During hospitalization patient began to have back pain and right thigh weakness.  MRI of the lumbar spine which showed discitis as well as septic arthritis and paraspinal abscess.  During hospitalization he did have left knee swelling, he had arthrocentesis which showed gram-positive cocci and had left knee washout. Patient was evaluated by ID who is recommending 6 to 8 weeks of IV antibiotics.   Assessment & Plan:   Principal Problem:   Septic arthritis of multiple joints (HCC) Active Problems:   Hepatitis C antibody test positive   Cigarette smoker   Intractable back pain   AKI (acute kidney injury) ([place])   Spinal stenosis of lumbosacral region   GERD (gastroesophageal reflux disease)   Mild protein-calorie malnutrition (HCC)   Septic arthritis of knee, left (HCC)   Acute medial meniscal tear, left, initial encounter   Polysubstance abuse (HCC)  Normocytic anemia   Effusion of left elbow   Recurrent boils   Discitis of lumbosacral region   Psoas abscess, right (HCC)   Multiple abscesses of right shoulder   Chronic infection of left knee (HCC)   1-Sepsis secondary to cellulitis of the right upper extremity multiple abscess large multiple lumbar abscess and septic arthritis of the left knee: -Patient present with tachycardia, leukocytosis and back pain. -MRI 5/5 showed severe lumbar spinal stenosis.  MRI 5/19 showed L4 S1 discitis, L4-L5 septic arthritis multiple paraspinal abscess. -CT right shoulder showed  intramuscular and infra facial abscess along with septic arthritis of the [facility] joint -Neurosurgery was consulted and  Recommended repeat MRI near the end of antibiotics. -ID consulted, recommend minimum of 6 weeks of IV antibiotics. -Orthopedic consulted.  Patient is status post left knee arthrocentesis and washout.  S/p irrigation and debridement of right shoulder, acromial medial clavicular joint irrigation and debridement on the right. -Echocardiogram was negative for vegetation. -PT consulted recommending SNF. -suture left knee removed. CPM machine ordered. Added low dose morphine prior patient using CPM machine.  Tolerating CPM machine.  Continue with IV antibiotics.   Leukocytosis;  Increase over last 2 days.  Check UA.  Patient denies cough, diarrhea.  Monitor.   AKI: -Creatinine on admission was 3.5. -Solved with IV fluids.  Creatinine 0.6.  Normocytic anemia: hb stable. Iron deficiency anemia.  Start ferrous sulfate and B12 supplement.  Advised him to follow up with PCP for colonoscopy.  Hb increasing today at 7.6----from 7  Hyponatremia: Improved with IV fluids.   Constipation: Continue with  bowel regimen  Polysubstance abuse -Admits to using cocaine prior to admission.  Refuses he is ever used IV heroin -UDS positive for benzodiazepines and cocaine.  Nutrition; start ensure  Estimated body mass  index is 23.68 kg/m as calculated from the following:   Height as of this encounter: 5\' 10"  (1.778 m).   Weight as of this encounter: 74.8 kg.   DVT prophylaxis: Lovenox Code Status: full code Family Communication: care discussed with patient.  Disposition Plan: awaiting completion of IV antibiotics.    Consultants:  Neurosurgery Infectious disease Orthopedic surgery   Procedures: -5/14- left kneearthrocentesiswas done, synovial fluidgrew gram-positive cocci. -5/15 -left knee washout. -5/20-R shoulder debridement of multiple abscesses and fluid [facility].    Antimicrobials:    Subjective: Back pain is not worse. He has been able to bend knee better.   Objective: Vitals:   07/16/18 0000 07/16/18 0407 07/16/18 0737 07/16/18 1158  BP: 137/78 125/78 127/82 120/76  Pulse: 99 (!) 103 (!) 106 93  Resp: 18 18 16 16   Temp: 98.5 F (36.9 C) 98.5 F (36.9 C) 98 F (36.7 C) 97.6 F (36.4 C)  TempSrc: Oral Oral Oral Oral  SpO2: 95% 99% 99% 98%  Weight:      Height:        Intake/Output Summary (Last 24 hours) at 07/16/2018 1255 Last data filed at 07/16/2018 0640 Gross per 24 hour  Intake 1120 ml  Output 3000 ml  Net -1880 ml   Filed Weights   06/19/18 1303 06/23/18 1708 06/28/18 1543  Weight: 74.8 kg 74.8 kg 74.8 kg    Examination:  General exam: NAD Respiratory system; CTA Cardiovascular system: S 1, S 2 RRR Gastrointestinal system: BS present, soft, nt Central nervous system: Non focal.  Extremities: right LE 4/5.left knee with edema, no redness.  Skin: No rashes, lesions or ulcers    Data Reviewed: I have personally reviewed following labs and imaging studies  CBC: Recent Labs  Lab 07/10/18 0300 07/11/18 1130 07/12/18 0500 07/13/18 0553 07/15/18 0744 07/16/18 0323  WBC 9.8  --  9.4 10.0 11.5* 12.1*  NEUTROABS 6.5  --   --  6.2  --  7.5  HGB 7.3* 7.2* 7.1* 7.1* 7.6* 8.1*  HCT 22.5* 22.9* 21.9* 22.7* 24.0* 25.5*  MCV 89.3  --  88.3  88.7 87.9 87.3  PLT 799*  --  869* 853* 787* 786*   Basic Metabolic Panel: Recent Labs  Lab 07/10/18 0300 07/12/18 0500 07/13/18 0551 07/16/18 0323  NA 130* 133* 132* 133*  K 3.7 3.5 3.8 3.9  CL 93* 93* 93* 94*  CO2 27 29 28 28   GLUCOSE 100* 103* 103* 103*  BUN 15 16 18 19   CREATININE 0.69 0.69 0.67 0.70  CALCIUM 8.9 9.1 9.2 9.4   GFR: Estimated Creatinine Clearance: 110.3 mL/min (by C-G formula based on SCr of 0.7 mg/dL). Liver Function Tests: No results for input(s): AST, ALT, ALKPHOS, BILITOT, PROT, ALBUMIN in the last 168 hours. No results for input(s): LIPASE, AMYLASE in the last 168 hours. No results for input(s): AMMONIA in the last 168 hours. Coagulation Profile: No results for input(s): INR, PROTIME in the last 168 hours. Cardiac Enzymes: No results for input(s): CKTOTAL, CKMB, CKMBINDEX, TROPONINI in the last 168 hours. BNP (last 3 results) No results for input(s): PROBNP in the last 8760 hours. HbA1C: No results for input(s): HGBA1C in the last 72 hours. CBG: No results for input(s): GLUCAP in the last 168 hours. Lipid Profile: No  results for input(s): CHOL, HDL, LDLCALC, TRIG, CHOLHDL, LDLDIRECT in the last 72 hours. Thyroid Function Tests: No results for input(s): TSH, T4TOTAL, FREET4, T3FREE, THYROIDAB in the last 72 hours. Anemia Panel: No results for input(s): VITAMINB12, FOLATE, FERRITIN, TIBC, IRON, RETICCTPCT in the last 72 hours. Sepsis Labs: No results for input(s): PROCALCITON, LATICACIDVEN in the last 168 hours.  No results found for this or any previous visit (from the past 240 hour(s)).       Radiology Studies: No results found.      Scheduled Meds: . diclofenac sodium  2 g Topical QID  . enoxaparin (LOVENOX) injection  40 mg Subcutaneous Daily  . esomeprazole  20 mg Oral QAC breakfast  . feeding supplement (ENSURE ENLIVE)  237 mL Oral TID BM  . ferrous sulfate  325 mg Oral BID WC  . folic acid  1 mg Oral Daily  . methocarbamol   500 mg Oral TID  . multivitamin with minerals  1 tablet Oral Daily  . polyethylene glycol  17 g Oral Daily  . senna-docusate  1 tablet Oral BID  . sodium chloride flush  10-40 mL Intracatheter Q12H  . sucralfate  1 g Oral TID WC & HS  . thiamine  100 mg Oral Daily   Or  . thiamine  100 mg Intravenous Daily  . traMADol  50 mg Oral Q6H  . vitamin B-12  100 mcg Oral Daily   Continuous Infusions: . sodium chloride 10 mL/hr at 07/13/18 2348  . methocarbamol (ROBAXIN) IV    . vancomycin 750 mg (07/16/18 0112)     LOS: 26 days    Time spent: 35 minutes.    Alba CoryBelkys A Pilot Prindle, MD [facility] Pager [phone]  If 7PM-7AM, please contact night-coverage [email or web address] Password [email or web address] 07/16/2018, 12:55 PM

## 2018-07-16 NOTE — Plan of Care (Signed)
Progressing towards goals

## 2018-07-17 LAB — CBC
HCT: 24.1 % — ABNORMAL LOW (ref 39.0–52.0)
Hemoglobin: 7.7 g/dL — ABNORMAL LOW (ref 13.0–17.0)
MCH: 27.5 pg (ref 26.0–34.0)
MCHC: 32 g/dL (ref 30.0–36.0)
MCV: 86.1 fL (ref 80.0–100.0)
Platelets: 691 10*3/uL — ABNORMAL HIGH (ref 150–400)
RBC: 2.8 MIL/uL — ABNORMAL LOW (ref 4.22–5.81)
RDW: 17.2 % — ABNORMAL HIGH (ref 11.5–15.5)
WBC: 14.6 10*3/uL — ABNORMAL HIGH (ref 4.0–10.5)
nRBC: 0 % (ref 0.0–0.2)

## 2018-07-17 MED ORDER — ALTEPLASE 2 MG IJ SOLR
2.0000 mg | Freq: Once | INTRAMUSCULAR | Status: AC
  Administered 2018-07-17: 2 mg
  Filled 2018-07-17: qty 2

## 2018-07-17 NOTE — Progress Notes (Signed)
Physical Therapy Treatment Patient Details Name: [name] MRN: [id number] DOB: 08-31-1965 Today's Date: 07/17/2018    History of Present Illness Pt. with PMH of hep C, substance abuse; admitted on 06/19/2018, presented with complaint of back pain, was found to have musculoskeletal back pain as well as right upper extremity cellulitis; Patient had a debridement of his shoulder perfromed on 5/20.     PT Comments    Patient continues to be limited by syncope. He seems to become more syncopal when his anxiety over movement increases. He also holds his breath with all transfers. He was able to transfer to the bed and to have a bowel movement. Therapy talked to the patient about the importance of at least sitting at the edge of the bed for mmeals so his body begins to be more used to sitting in an upright position.   Follow Up Recommendations  SNF;CIR     Equipment Recommendations  Other (comment)    Recommendations for Other Services Rehab consult     Precautions / Restrictions Precautions Precautions: Shoulder Type of Shoulder Precautions: Active protocol: PROM/AROM shoulder FF to 90, abduct to 60, ER to 30; verbal order per Dr. Marlou Sa on 5/25 AROM elbow/wrist/hand as tolerated  Shoulder Interventions: Shoulder sling/immobilizer;For comfort Required Braces or Orthoses: Sling Restrictions Weight Bearing Restrictions: Yes RUE Weight Bearing: Weight bearing as tolerated LLE Weight Bearing: Weight bearing as tolerated    Mobility  Bed Mobility Overal bed mobility: Needs Assistance Bed Mobility: Supine to Sit;Sit to Supine Rolling: Max assist   Supine to sit: Max assist;+2 for physical assistance Sit to supine: +2 for safety/equipment;Max assist   General bed mobility comments: max A for LLE management and trunk support to pull to sit. Needing frequent cues for hand placement, etc. Pt has preference of certain sequence of events even if it is not entirely  necessary  Transfers Overall transfer level: Needs assistance Equipment used: Rolling walker (2 wheeled) Transfers: Sit to/from Stand Sit to Stand: Mod assist;+2 safety/equipment;+2 physical assistance   Squat pivot transfers: Mod assist;+2 safety/equipment;+2 physical assistance     General transfer comment: mod A +2 for power to stand, once standing pt at min guard. Pt states he feels as if he is going to pass out in standing, holds breath and suspect holds breath in [facility]  Ambulation/Gait                 Stairs             Wheelchair Mobility    Modified Rankin (Stroke Patients Only)       Balance Overall balance assessment: Needs assistance Sitting-balance support: No upper extremity supported;Feet supported Sitting balance-Leahy Scale: Fair Sitting balance - Comments: needing supervision for static balance, holds onto EOB for support   Standing balance support: Bilateral upper extremity supported;During functional activity Standing balance-Leahy Scale: Poor Standing balance comment: heavily reliant on external support                            Cognition Arousal/Alertness: Awake/alert Behavior During Therapy: WFL for tasks assessed/performed Overall Cognitive Status: Within Functional Limits for tasks assessed                                 General Comments: self limiting, stating "I am going to pass out" but no indications of that happening      Exercises  General Comments        Pertinent Vitals/Pain Pain Assessment: Faces Faces Pain Scale: Hurts whole lot Pain Location: L knee/ low back Pain Descriptors / Indicators: Sore;Grimacing;Guarding;Moaning Pain Intervention(s): Limited activity within patient's tolerance;Monitored during session;Premedicated before session;Repositioned    Home Living                      Prior Function            PT Goals (current goals can now be found in the care plan  section) Acute Rehab PT Goals Patient Stated Goal: to progress mobility PT Goal Formulation: With patient Time For Goal Achievement: 07/05/18 Potential to Achieve Goals: Good Progress towards PT goals: Progressing toward goals    Frequency    Min 3X/week      PT Plan Current plan remains appropriate    Co-evaluation   Reason for Co-Treatment: Complexity of the patient's impairments (multi-system involvement);For patient/therapist safety;To address functional/ADL transfers PT goals addressed during session: Mobility/safety with mobility;Proper use of DME;Strengthening/ROM OT goals addressed during session: ADL's and self-care;Proper use of Adaptive equipment and DME      AM-PAC PT "6 Clicks" Mobility   Outcome Measure  Help needed turning from your back to your side while in a flat bed without using bedrails?: A Lot Help needed moving from lying on your back to sitting on the side of a flat bed without using bedrails?: A Lot Help needed moving to and from a bed to a chair (including a wheelchair)?: A Lot Help needed standing up from a chair using your arms (e.g., wheelchair or bedside chair)?: A Lot Help needed to walk in hospital room?: Total Help needed climbing 3-5 steps with a railing? : Total 6 Click Score: 10    End of Session Equipment Utilized During Treatment: Gait belt;Other (comment) Activity Tolerance: Patient limited by pain Patient left: with call bell/phone within reach;with SCD's reapplied;in chair Nurse Communication: Mobility status PT Visit Diagnosis: Unsteadiness on feet (R26.81);Muscle weakness (generalized) (M62.81);Pain;Other abnormalities of gait and mobility (R26.89) Pain - Right/Left: Right Pain - part of body: Arm     Time: [id number] PT Time Calculation (min) (ACUTE ONLY): 32 min  Charges:  $Therapeutic Activity: 8-22 mins                        Dessie Comaavid J Aseneth Hack PT DPT  07/17/2018, 2:12 PM

## 2018-07-17 NOTE — Progress Notes (Signed)
Occupational Therapy Treatment Patient Details Name: [name] MRN: [id number] DOB: 06/10/1965 Today's Date: 07/17/2018    History of present illness Pt. with PMH of hep C, substance abuse; admitted on 06/19/2018, presented with complaint of back pain, was found to have musculoskeletal back pain as well as right upper extremity cellulitis; Patient had a debridement of his shoulder perfromed on 5/20.    OT comments  Pt progressing toward stated goals, focused session on ADL progression. BSC t/f completed at max A. Pt needing total A for peri care after BM, standing with RW at mod A +2 to power to stand- but once standing pt min guard. During this time pt holding breath, and suspect holding breath a lot during BM. He began to appear anxious and that he was going to pass out, although to other signs were indicative to this. When c/o of syncope symptoms, pt still requesting blanket to cradle LLE. Pt safely placed supine, cold cloth given on forehead and symptoms resolved. D/c plan remains appropriate per pt continued progression and tolerance. Will continue to follow per POC listed below.   Follow Up Recommendations  SNF;CIR;Supervision/Assistance - 24 hour    Equipment Recommendations  Other (comment)(defer to next venue)    Recommendations for Other Services      Precautions / Restrictions Precautions Precautions: Shoulder Type of Shoulder Precautions: Active protocol: PROM/AROM shoulder FF to 90, abduct to 60, ER to 30; verbal order per Dr. Marlou Sa on 5/25 AROM elbow/wrist/hand as tolerated  Shoulder Interventions: Shoulder sling/immobilizer;For comfort Required Braces or Orthoses: Sling Restrictions Weight Bearing Restrictions: Yes RUE Weight Bearing: Weight bearing as tolerated LLE Weight Bearing: Weight bearing as tolerated       Mobility Bed Mobility Overal bed mobility: Needs Assistance Bed Mobility: Supine to Sit;Sit to Supine     Supine to sit: Max assist;+2 for physical  assistance Sit to supine: +2 for safety/equipment;Max assist   General bed mobility comments: max A for LLE management and trunk support to pull to sit. Needing frequent cues for hand placement, etc. Pt has preference of certain sequence of events even if it is not entirely necessary  Transfers Overall transfer level: Needs assistance Equipment used: Rolling walker (2 wheeled) Transfers: Sit to/from Stand Sit to Stand: Mod assist;+2 safety/equipment;+2 physical assistance   Squat pivot transfers: Mod assist;+2 safety/equipment;+2 physical assistance     General transfer comment: mod A +2 for power to stand, once standing pt at min guard. Pt states he feels as if he is going to pass out in standing, holds breath and suspect holds breath in BM    Balance Overall balance assessment: Needs assistance Sitting-balance support: No upper extremity supported;Feet supported Sitting balance-Leahy Scale: Fair Sitting balance - Comments: needing supervision for static balance, holds onto EOB for support   Standing balance support: Bilateral upper extremity supported;During functional activity Standing balance-Leahy Scale: Poor Standing balance comment: heavily reliant on external support                           ADL either performed or assessed with clinical judgement   ADL Overall ADL's : Needs assistance/impaired                         Toilet Transfer: Maximal assistance;Squat-pivot;BSC Toilet Transfer Details (indicate cue type and reason): to [facility] Toileting- Clothing Manipulation and Hygiene: Total assistance;Sit to/from stand Toileting - Clothing Manipulation Details (indicate cue type and reason): stood  with RW, mod A +2 to get to standing, once standing min guard. Total A for actual peri care due to reliance on BUE support     Functional mobility during ADLs: Moderate assistance;+2 for safety/equipment General ADL Comments: session focused on mobility  progression, in preperation for ADL participation      Vision Patient Visual Report: No change from baseline     Perception     Praxis      Cognition Arousal/Alertness: Awake/alert Behavior During Therapy: WFL for tasks assessed/performed Overall Cognitive Status: Within Functional Limits for tasks assessed                                 General Comments: self limiting, stating "I am going to pass out" but no indications of that happening        Exercises     Shoulder Instructions       General Comments      Pertinent Vitals/ Pain       Pain Assessment: Faces Faces Pain Scale: Hurts whole lot Pain Location: L knee/ low back Pain Descriptors / Indicators: Sore;Grimacing;Guarding;Moaning Pain Intervention(s): Limited activity within patient's tolerance;Monitored during session;Premedicated before session;Repositioned  Home Living                                          Prior Functioning/Environment              Frequency  Min 3X/week        Progress Toward Goals  OT Goals(current goals can now be found in the care plan section)  Progress towards OT goals: Progressing toward goals  Acute Rehab OT Goals Patient Stated Goal: to progress mobility OT Goal Formulation: With patient Time For Goal Achievement: 07/31/18 Potential to Achieve Goals: Good  Plan Discharge plan remains appropriate;Frequency remains appropriate    Co-evaluation    PT/OT/SLP Co-Evaluation/Treatment: Yes Reason for Co-Treatment: Complexity of the patient's impairments (multi-system involvement);For patient/therapist safety;To address functional/ADL transfers PT goals addressed during session: Mobility/safety with mobility;Proper use of DME;Strengthening/ROM OT goals addressed during session: ADL's and self-care;Proper use of Adaptive equipment and DME      AM-PAC OT "6 Clicks" Daily Activity     Outcome Measure   Help from another person  eating meals?: None Help from another person taking care of personal grooming?: A Little Help from another person toileting, which includes using toliet, bedpan, or urinal?: A Lot Help from another person bathing (including washing, rinsing, drying)?: A Lot Help from another person to put on and taking off regular upper body clothing?: A Little Help from another person to put on and taking off regular lower body clothing?: Total 6 Click Score: 15    End of Session Equipment Utilized During Treatment: Gait belt;Rolling walker  OT Visit Diagnosis: Muscle weakness (generalized) (M62.81);Pain Pain - Right/Left: Right Pain - part of body: Shoulder;Leg   Activity Tolerance Patient tolerated treatment well   Patient Left in bed;with call bell/phone within reach   Nurse Communication          Time: [id number] OT Time Calculation (min): 32 min  Charges: OT General Charges $OT Visit: 1 Visit OT Treatments $Self Care/Home Management : 23-37 mins  Dalphine HandingKaylee Ariba Lehnen, MSOT, OTR/L Behavioral Health OT/ Acute Relief OT [facility] Office: [phone]    Dalphine HandingKaylee Cid [employer] 07/17/2018, 2:10 PM

## 2018-07-17 NOTE — Progress Notes (Signed)
PROGRESS NOTE    [name]  ZSW:[id number] DOB: Jan 24, 1966 DOA: 06/19/2018 PCP: Patient, No Pcp Per   Brief Narrative: 53 year old with past medical history significant for hepatitis C, who presented to the ED with complaints of increasing back pain, radiating down to the left leg initially but subsequently both legs.  Patient was having difficulty standing and walking.  Per patient the pain started after he attempted to lift a lawnmower over a week ago.  Evaluation in the ED, MRI showed severe foraminal narrowing at level L5-S1.  Patient was advised to follow-up with neurosurgery as an outpatient, but he reported that he did not understand instruction.  Patient was discharged on prednisone and Percocet.    He subsequently was admitted with sepsis secondary to right upper extremity cellulitis and was a started on IV antibiotics, however cellulitis did not improve.  He did had a CT scan which showed intra-facial abscess in the right shoulder along with septic arthritis of the [facility] joint.  Patient had debridement of multiple abscess.  During hospitalization patient began to have back pain and right thigh weakness.  MRI of the lumbar spine which showed discitis as well as septic arthritis and paraspinal abscess.  During hospitalization he did have left knee swelling, he had arthrocentesis which showed gram-positive cocci and had left knee washout. Patient was evaluated by ID who is recommending 6 to 8 weeks of IV antibiotics. Vancomycin is started 06/23/2018----   Assessment & Plan:   Principal Problem:   Septic arthritis of multiple joints (HCC) Active Problems:   Hepatitis C antibody test positive   Cigarette smoker   Intractable back pain   AKI (acute kidney injury) ([place])   Spinal stenosis of lumbosacral region   GERD (gastroesophageal reflux disease)   Mild protein-calorie malnutrition (HCC)   Septic arthritis of knee, left (HCC)   Acute medial meniscal tear, left, initial encounter  Polysubstance abuse (HCC)   Normocytic anemia   Effusion of left elbow   Recurrent boils   Discitis of lumbosacral region   Psoas abscess, right (HCC)   Multiple abscesses of right shoulder   Chronic infection of left knee (HCC)   1-Sepsis secondary to cellulitis of the right upper extremity multiple abscess large multiple lumbar abscess and septic arthritis of the left knee: -Patient presented with tachycardia, leukocytosis and back pain. -MRI 5/5 showed severe lumbar spinal stenosis.  MRI 5/19 showed L4 S1 discitis, L4-L5 septic arthritis multiple paraspinal abscess. -CT right shoulder showed  intramuscular and infra facial abscess along with septic arthritis of the [facility] joint -Neurosurgery was consulted and  Recommended repeat MRI near the end of antibiotics. -ID consulted, recommend minimum of 6 weeks of IV antibiotics. -Orthopedic consulted.  Patient is status post left knee arthrocentesis and washout.  S/p irrigation and debridement of right shoulder, acromial medial clavicular joint irrigation and debridement on the right. -Echocardiogram was negative for vegetation. -PT consulted recommending SNF. -suture left knee removed. CPM machine ordered. Added low dose morphine prior patient using CPM machine.  Tolerating CPM machine.  Continue with IV antibiotics.   Leukocytosis;  No obvious source.  Will monitor.  AKI: -Creatinine on admission was 3.5.  Normalized.  Normocytic anemia: hb stable. Iron deficiency anemia.  Start ferrous sulfate and B12 supplement.  Advised him to follow up with PCP for colonoscopy.  Hb remains a stable.  Hyponatremia: Improved with IV fluids.  Currently remains a stable.  Constipation: Continue with bowel regimen  Polysubstance abuse -Admits to using cocaine prior  to admission.  Refuses he is ever used IV heroin -UDS positive for benzodiazepines and cocaine.  Nutrition; start ensure  Estimated body mass index is 23.68 kg/m as calculated from  the following:   Height as of this encounter: 5\' 10"  (1.778 m).   Weight as of this encounter: 74.8 kg.   DVT prophylaxis: Lovenox Code Status: full code Family Communication: care discussed with patient.  Disposition Plan: awaiting completion of IV antibiotics.    Consultants:  Neurosurgery Infectious disease Orthopedic surgery   Procedures: -5/14- left kneearthrocentesiswas done, synovial fluidgrew gram-positive cocci. -5/15 -left knee washout. -5/20-R shoulder debridement of multiple abscesses and [facility].    Antimicrobials:  Vancomycin, 06/23/2018----  Subjective: Patient seen and examined.  Denies any complaints.  He was not liking to take Carafate that makes him sick. Patient is on telemetry for last 1 month in the hospital, he had no adverse cardiac events.  Will discontinue telemetry.  Objective: Vitals:   07/16/18 2022 07/16/18 2336 07/17/18 0458 07/17/18 0756  BP: (!) 141/78 132/76 132/80 131/75  Pulse: (!) 108 (!) 108 (!) 108 (!) 103  Resp: 16 16  17   Temp: 98.2 F (36.8 C) 97.9 F (36.6 C) 98.2 F (36.8 C) 97.9 F (36.6 C)  TempSrc: Oral Oral Oral Oral  SpO2: 97% 98% 97% 96%  Weight:      Height:        Intake/Output Summary (Last 24 hours) at 07/17/2018 1150 Last data filed at 07/17/2018 1148 Gross per 24 hour  Intake 313.34 ml  Output 1000 ml  Net -686.66 ml   Filed Weights   06/19/18 1303 06/23/18 1708 06/28/18 1543  Weight: 74.8 kg 74.8 kg 74.8 kg    Examination:  General exam: NAD Respiratory system; CTA Cardiovascular system: S 1, S 2 RRR Gastrointestinal system: BS present, soft, nt Central nervous system: Non focal.  Extremities: right LE 4/5.left knee with edema, no redness.  Skin: No rashes, lesions or ulcers    Data Reviewed: I have personally reviewed following labs and imaging studies  CBC: Recent Labs  Lab 07/11/18 1130 07/12/18 0500 07/13/18 0553 07/15/18 0744 07/16/18 0323  WBC  --  9.4 10.0  11.5* 12.1*  NEUTROABS  --   --  6.2  --  7.5  HGB 7.2* 7.1* 7.1* 7.6* 8.1*  HCT 22.9* 21.9* 22.7* 24.0* 25.5*  MCV  --  88.3 88.7 87.9 87.3  PLT  --  869* 853* 787* 786*   Basic Metabolic Panel: Recent Labs  Lab 07/12/18 0500 07/13/18 0551 07/16/18 0323  NA 133* 132* 133*  K 3.5 3.8 3.9  CL 93* 93* 94*  CO2 29 28 28   GLUCOSE 103* 103* 103*  BUN 16 18 19   CREATININE 0.69 0.67 0.70  CALCIUM 9.1 9.2 9.4   GFR: Estimated Creatinine Clearance: 110.3 mL/min (by C-G formula based on SCr of 0.7 mg/dL). Liver Function Tests: No results for input(s): AST, ALT, ALKPHOS, BILITOT, PROT, ALBUMIN in the last 168 hours. No results for input(s): LIPASE, AMYLASE in the last 168 hours. No results for input(s): AMMONIA in the last 168 hours. Coagulation Profile: No results for input(s): INR, PROTIME in the last 168 hours. Cardiac Enzymes: No results for input(s): CKTOTAL, CKMB, CKMBINDEX, TROPONINI in the last 168 hours. BNP (last 3 results) No results for input(s): PROBNP in the last 8760 hours. HbA1C: No results for input(s): HGBA1C in the last 72 hours. CBG: No results for input(s): GLUCAP in the last 168 hours.  Lipid Profile: No results for input(s): CHOL, HDL, LDLCALC, TRIG, CHOLHDL, LDLDIRECT in the last 72 hours. Thyroid Function Tests: No results for input(s): TSH, T4TOTAL, FREET4, T3FREE, THYROIDAB in the last 72 hours. Anemia Panel: No results for input(s): VITAMINB12, FOLATE, FERRITIN, TIBC, IRON, RETICCTPCT in the last 72 hours. Sepsis Labs: No results for input(s): PROCALCITON, LATICACIDVEN in the last 168 hours.  No results found for this or any previous visit (from the past 240 hour(s)).       Radiology Studies: No results found.      Scheduled Meds: . diclofenac sodium  2 g Topical QID  . enoxaparin (LOVENOX) injection  40 mg Subcutaneous Daily  . esomeprazole  20 mg Oral QAC breakfast  . feeding supplement (ENSURE ENLIVE)  237 mL Oral TID BM  . ferrous  sulfate  325 mg Oral BID WC  . folic acid  1 mg Oral Daily  . methocarbamol  500 mg Oral TID  . multivitamin with minerals  1 tablet Oral Daily  . polyethylene glycol  17 g Oral Daily  . senna-docusate  1 tablet Oral BID  . sodium chloride flush  10-40 mL Intracatheter Q12H  . thiamine  100 mg Oral Daily   Or  . thiamine  100 mg Intravenous Daily  . traMADol  50 mg Oral Q6H  . vitamin B-12  100 mcg Oral Daily   Continuous Infusions: . sodium chloride 10 mL/hr at 07/13/18 2348  . methocarbamol (ROBAXIN) IV    . vancomycin 750 mg (07/17/18 0259)     LOS: 27 days    Time spent: 15 minutes.    Dorcas CarrowKuber Gayleen Sholtz, MD [facility] Pager [phone]  If 7PM-7AM, please contact night-coverage [email or web address] Password [email or web address] 07/17/2018, 11:50 AM

## 2018-07-18 LAB — URINALYSIS, ROUTINE W REFLEX MICROSCOPIC
Bilirubin Urine: NEGATIVE
Glucose, UA: NEGATIVE mg/dL
Hgb urine dipstick: NEGATIVE
Ketones, ur: NEGATIVE mg/dL
Leukocytes,Ua: NEGATIVE
Nitrite: NEGATIVE
Protein, ur: NEGATIVE mg/dL
Specific Gravity, Urine: 1.013 (ref 1.005–1.030)
pH: 6 (ref 5.0–8.0)

## 2018-07-18 NOTE — Plan of Care (Signed)
Progressing toward goals. 

## 2018-07-18 NOTE — Progress Notes (Signed)
PROGRESS NOTE    [name]  ZOX:[id number] DOB: 01/31/1966 DOA: 06/19/2018 PCP: Patient, No Pcp Per   Brief Narrative: 53 year old with past medical history significant for hepatitis C, who presented to the ED with complaints of increasing back pain, radiating down to the left leg initially but subsequently both legs.  Patient was having difficulty standing and walking.  Per patient the pain started after he attempted to lift a lawnmower over a week ago.  Evaluation in the ED, MRI showed severe foraminal narrowing at level L5-S1.   He subsequently was admitted with sepsis secondary to right upper extremity cellulitis and was started on IV antibiotics, however cellulitis did not improve.  He did have a CT scan which showed intra-facial abscess in the right shoulder along with septic [facility] of the [facility].  Patient had debridement of multiple abscess.  During hospitalization patient began to have back pain and right thigh weakness.  MRI of the lumbar spine which showed discitis as well as septic arthritis and paraspinal abscess. During hospitalization he did have left knee swelling, he had arthrocentesis which showed gram-positive cocci and had left knee washout. Patient was evaluated by ID who is recommending 6 to 8 weeks of IV antibiotics. Vancomycin is started 06/23/2018---   Assessment & Plan:   Principal Problem:   Septic arthritis of multiple joints (HCC) Active Problems:   Hepatitis C antibody test positive   Cigarette smoker   Intractable back pain   AKI (acute kidney injury) (HCC)   Spinal stenosis of lumbosacral region   GERD (gastroesophageal reflux disease)   Mild protein-calorie malnutrition (HCC)   Septic arthritis of knee, left (HCC)   Acute medial meniscal tear, left, initial encounter   Polysubstance abuse (HCC)   Normocytic anemia   Effusion of left elbow   Recurrent boils   Discitis of lumbosacral region   Psoas abscess, right (HCC)   Multiple abscesses of  right shoulder   Chronic infection of left knee (HCC)   1-Sepsis secondary to cellulitis of the right upper extremity multiple abscess large multiple lumbar abscess and septic arthritis of the left knee: -Patient presented with tachycardia, leukocytosis and back pain. -MRI 5/5 showed severe lumbar spinal stenosis.  MRI 5/19 showed L4 S1 discitis, L4-L5 septic arthritis multiple paraspinal abscess. -CT right shoulder showed  intramuscular and infra facial abscess along with septic [facility] -Neurosurgery was consulted and  Recommended repeat MRI near the end of antibiotics. -ID consulted, recommend minimum of 6 weeks of IV antibiotics. -Orthopedic consulted.  Patient is status post left knee arthrocentesis and washout.  S/p irrigation and debridement of right shoulder, acromial medial clavicular joint irrigation and debridement on the right. -Echocardiogram was negative for vegetation. -PT consulted recommending SNF. -suture left knee removed. CPM machine ordered. Added low dose morphine prior patient using CPM machine.  Tolerating CPM machine.  Continue with IV antibiotics.   Leukocytosis;  No obvious source.  Will monitor. Will recheck in morning.  AKI: -Creatinine on admission was 3.5.  Normalized.  Normocytic anemia: hb stable. Iron deficiency anemia.  Start ferrous sulfate and B12 supplement.  Advised him to follow up with PCP for colonoscopy.  Hb remains a stable.  Hyponatremia: Improved with IV fluids.  Currently remains a stable.  Constipation: Continue with bowel regimen  Polysubstance abuse -Admits to using cocaine prior to admission.  Refuses he is ever used IV heroin. -UDS positive for benzodiazepines and cocaine.  Nutrition; start ensure  Estimated body mass index  is 23.68 kg/m as calculated from the following:   Height as of this encounter: 5\' 10"  (1.778 m).   Weight as of this encounter: 74.8 kg.   DVT prophylaxis: Lovenox Code Status: full  code Family Communication: care discussed with patient.  Disposition Plan: awaiting completion of IV antibiotics.    Consultants:  Neurosurgery Infectious disease Orthopedic surgery   Procedures: -5/14- left kneearthrocentesiswas done, synovial fluidgrew gram-positive cocci. -5/15 -left knee washout. -5/20-R shoulder debridement of multiple abscesses and fluid in [facility].    Antimicrobials:  Vancomycin, 06/23/2018----  Subjective: Patient seen and examined. Had some nausea now relieved.   Objective: Vitals:   07/17/18 1918 07/17/18 2338 07/18/18 0324 07/18/18 0816  BP: (!) 145/87 137/74 (!) 143/72 131/77  Pulse: (!) 105 (!) 106 94 (!) 103  Resp: 18 18 18 17   Temp: 98.2 F (36.8 C) 98.9 F (37.2 C) 97.7 F (36.5 C) 98.2 F (36.8 C)  TempSrc: Oral Oral Oral Oral  SpO2: 98% 97% 98% 97%  Weight:      Height:        Intake/Output Summary (Last 24 hours) at 07/18/2018 1123 Last data filed at 07/18/2018 0800 Gross per 24 hour  Intake 660 ml  Output 3800 ml  Net -3140 ml   Filed Weights   06/19/18 1303 06/23/18 1708 06/28/18 1543  Weight: 74.8 kg 74.8 kg 74.8 kg    Examination:  General exam: NAD Respiratory system; CTA Cardiovascular system: S 1, S 2 RRR Gastrointestinal system: BS present, soft, nt Central nervous system: Non focal.  Extremities: right LE 4/5.left knee with edema and stiffness, no redness.  Skin: No rashes, lesions or ulcers    Data Reviewed: I have personally reviewed following labs and imaging studies  CBC: Recent Labs  Lab 07/12/18 0500 07/13/18 0553 07/15/18 0744 07/16/18 0323 07/17/18 2000  WBC 9.4 10.0 11.5* 12.1* 14.6*  NEUTROABS  --  6.2  --  7.5  --   HGB 7.1* 7.1* 7.6* 8.1* 7.7*  HCT 21.9* 22.7* 24.0* 25.5* 24.1*  MCV 88.3 88.7 87.9 87.3 86.1  PLT 869* 853* 787* 786* 691*   Basic Metabolic Panel: Recent Labs  Lab 07/12/18 0500 07/13/18 0551 07/16/18 0323  NA 133* 132* 133*  K 3.5 3.8 3.9  CL 93* 93*  94*  CO2 29 28 28   GLUCOSE 103* 103* 103*  BUN 16 18 19   CREATININE 0.69 0.67 0.70  CALCIUM 9.1 9.2 9.4   GFR: Estimated Creatinine Clearance: 110.3 mL/min (by C-G formula based on SCr of 0.7 mg/dL). Liver Function Tests: No results for input(s): AST, ALT, ALKPHOS, BILITOT, PROT, ALBUMIN in the last 168 hours. No results for input(s): LIPASE, AMYLASE in the last 168 hours. No results for input(s): AMMONIA in the last 168 hours. Coagulation Profile: No results for input(s): INR, PROTIME in the last 168 hours. Cardiac Enzymes: No results for input(s): CKTOTAL, CKMB, CKMBINDEX, TROPONINI in the last 168 hours. BNP (last 3 results) No results for input(s): PROBNP in the last 8760 hours. HbA1C: No results for input(s): HGBA1C in the last 72 hours. CBG: No results for input(s): GLUCAP in the last 168 hours. Lipid Profile: No results for input(s): CHOL, HDL, LDLCALC, TRIG, CHOLHDL, LDLDIRECT in the last 72 hours. Thyroid Function Tests: No results for input(s): TSH, T4TOTAL, FREET4, T3FREE, THYROIDAB in the last 72 hours. Anemia Panel: No results for input(s): VITAMINB12, FOLATE, FERRITIN, TIBC, IRON, RETICCTPCT in the last 72 hours. Sepsis Labs: No results for input(s): PROCALCITON, LATICACIDVEN in  the last 168 hours.  No results found for this or any previous visit (from the past 240 hour(s)).       Radiology Studies: No results found.      Scheduled Meds: . diclofenac sodium  2 g Topical QID  . enoxaparin (LOVENOX) injection  40 mg Subcutaneous Daily  . esomeprazole  20 mg Oral QAC breakfast  . feeding supplement (ENSURE ENLIVE)  237 mL Oral TID BM  . ferrous sulfate  325 mg Oral BID WC  . folic acid  1 mg Oral Daily  . methocarbamol  500 mg Oral TID  . multivitamin with minerals  1 tablet Oral Daily  . polyethylene glycol  17 g Oral Daily  . senna-docusate  1 tablet Oral BID  . sodium chloride flush  10-40 mL Intracatheter Q12H  . thiamine  100 mg Oral Daily    Or  . thiamine  100 mg Intravenous Daily  . traMADol  50 mg Oral Q6H  . vitamin B-12  100 mcg Oral Daily   Continuous Infusions: . sodium chloride 10 mL/hr at 07/13/18 2348  . methocarbamol (ROBAXIN) IV    . vancomycin 750 mg (07/18/18 0112)     LOS: 28 days    Time spent: 15 minutes.    Barb Merino, MD [facility] Pager [phone]  If 7PM-7AM, please contact night-coverage [email or web address] Password [email or web address] 07/18/2018, 11:23 AM

## 2018-07-18 NOTE — Plan of Care (Signed)
  Problem: Education: Goal: Knowledge of General Education information will improve Description Including pain rating scale, medication(s)/side effects and non-pharmacologic comfort measures Outcome: Progressing   Problem: Health Behavior/Discharge Planning: Goal: Ability to manage health-related needs will improve Outcome: Progressing   Problem: Clinical Measurements: Goal: Ability to maintain clinical measurements within normal limits will improve Outcome: Progressing Goal: Will remain free from infection Outcome: Progressing Goal: Diagnostic test results will improve Outcome: Progressing Goal: Respiratory complications will improve Outcome: Progressing Goal: Cardiovascular complication will be avoided Outcome: Progressing   Problem: Activity: Goal: Risk for activity intolerance will decrease Outcome: Progressing   Problem: Nutrition: Goal: Adequate nutrition will be maintained Outcome: Progressing   Problem: Coping: Goal: Level of anxiety will decrease Outcome: Progressing   Problem: Elimination: Goal: Will not experience complications related to bowel motility Outcome: Progressing Goal: Will not experience complications related to urinary retention Outcome: Progressing   Problem: Safety: Goal: Ability to remain free from injury will improve Outcome: Progressing   Problem: Skin Integrity: Goal: Risk for impaired skin integrity will decrease Outcome: Progressing    Ival Bible, BSN, RN

## 2018-07-18 NOTE — Progress Notes (Signed)
PT Cancellation Note  Patient Details Name: [name] MRN: [id number] DOB: January 06, 1966   Cancelled Treatment:     Patient refused through nursing. PT will follow up on 07/18/2018 as time allows.    Carney Living PT DPT  07/18/2018, 4:36 PM

## 2018-07-19 LAB — CBC WITH DIFFERENTIAL/PLATELET
Abs Immature Granulocytes: 0.55 10*3/uL — ABNORMAL HIGH (ref 0.00–0.07)
Basophils Absolute: 0.1 10*3/uL (ref 0.0–0.1)
Basophils Relative: 1 %
Eosinophils Absolute: 0.1 10*3/uL (ref 0.0–0.5)
Eosinophils Relative: 1 %
HCT: 24.6 % — ABNORMAL LOW (ref 39.0–52.0)
Hemoglobin: 7.7 g/dL — ABNORMAL LOW (ref 13.0–17.0)
Immature Granulocytes: 4 %
Lymphocytes Relative: 23 %
Lymphs Abs: 3.2 10*3/uL (ref 0.7–4.0)
MCH: 27.4 pg (ref 26.0–34.0)
MCHC: 31.3 g/dL (ref 30.0–36.0)
MCV: 87.5 fL (ref 80.0–100.0)
Monocytes Absolute: 0.9 10*3/uL (ref 0.1–1.0)
Monocytes Relative: 7 %
Neutro Abs: 9.5 10*3/uL — ABNORMAL HIGH (ref 1.7–7.7)
Neutrophils Relative %: 64 %
Platelets: 675 10*3/uL — ABNORMAL HIGH (ref 150–400)
RBC: 2.81 MIL/uL — ABNORMAL LOW (ref 4.22–5.81)
RDW: 17.2 % — ABNORMAL HIGH (ref 11.5–15.5)
WBC: 14.3 10*3/uL — ABNORMAL HIGH (ref 4.0–10.5)
nRBC: 0 % (ref 0.0–0.2)

## 2018-07-19 LAB — BASIC METABOLIC PANEL
Anion gap: 12 (ref 5–15)
BUN: 18 mg/dL (ref 6–20)
CO2: 28 mmol/L (ref 22–32)
Calcium: 9.4 mg/dL (ref 8.9–10.3)
Chloride: 94 mmol/L — ABNORMAL LOW (ref 98–111)
Creatinine, Ser: 0.65 mg/dL (ref 0.61–1.24)
GFR calc Af Amer: >60 mL/min (ref >60)
GFR calc non Af Amer: >60 mL/min (ref >60)
Glucose, Bld: 104 mg/dL — ABNORMAL HIGH (ref 70–99)
Potassium: 3.9 mmol/L (ref 3.5–5.1)
Sodium: 134 mmol/L — ABNORMAL LOW (ref 135–145)

## 2018-07-19 NOTE — Progress Notes (Signed)
PROGRESS NOTE    Carollee MassedJames Croson  ZOX:[id number] DOB: 11/03/1965 DOA: 06/19/2018 PCP: Patient, No Pcp Per    Brief Narrative:   Patient in the hospital to finish IV antibiotics.  Assessment & Plan:   Principal Problem:   Septic arthritis of multiple joints (HCC) Active Problems:   Hepatitis C antibody test positive   Cigarette smoker   Intractable back pain   AKI (acute kidney injury) (HCC)   Spinal stenosis of lumbosacral region   GERD (gastroesophageal reflux disease)   Mild protein-calorie malnutrition (HCC)   Septic arthritis of knee, left (HCC)   Acute medial meniscal tear, left, initial encounter   Polysubstance abuse (HCC)   Normocytic anemia   Effusion of left elbow   Recurrent boils   Discitis of lumbosacral region   Psoas abscess, right (HCC)   Multiple abscesses of right shoulder   Chronic infection of left knee (HCC)   No change in plan today. Continue IV vancomycin as dosed by pharmacy.   DVT prophylaxis: Lovenox Code Status: Full code Family Communication: None Disposition Plan: Stay in the hospital for IV antibiotics   Consultants:   None  Procedures:   Multiple procedures  Antimicrobials:   Vancomycin, 06/23/2018---   Subjective: Seen and examined.  No overnight events.  Objective: Vitals:   07/18/18 1914 07/18/18 2319 07/19/18 0339 07/19/18 0803  BP: 129/75 122/71 123/67 138/78  Pulse: 93 (!) 105 (!) 101 97  Resp: 18 18 18 18   Temp: 98.4 F (36.9 C) 97.9 F (36.6 C) 98 F (36.7 C) 98.1 F (36.7 C)  TempSrc: Oral Oral Oral Oral  SpO2: 98% 97% 98% 97%  Weight:      Height:        Intake/Output Summary (Last 24 hours) at 07/19/2018 0926 Last data filed at 07/18/2018 2319 Gross per 24 hour  Intake 435.08 ml  Output 1200 ml  Net -764.92 ml   Filed Weights   06/19/18 1303 06/23/18 1708 06/28/18 1543  Weight: 74.8 kg 74.8 kg 74.8 kg    Examination:  General exam: Appears calm and comfortable  Respiratory system: Clear to  auscultation. Respiratory effort normal. Cardiovascular system: S1 & S2 heard, RRR. No JVD, murmurs, rubs, gallops or clicks. No pedal edema. Gastrointestinal system: Abdomen is nondistended, soft and nontender. No organomegaly or masses felt. Normal bowel sounds heard. Central nervous system: Alert and oriented. No focal neurological deficits. Extremities: Symmetric 5 x 5 power.  Right shoulder with surgical scar clean and dry.  With a stiffness.  Left knee with surgical scar clean dry with knee stiffness. Skin: No rashes, lesions or ulcers, Psychiatry: Judgement and insight appear normal. Mood & affect appropriate.     Data Reviewed: I have personally reviewed following labs and imaging studies  CBC: Recent Labs  Lab 07/13/18 0553 07/15/18 0744 07/16/18 0323 07/17/18 2000 07/19/18 0522  WBC 10.0 11.5* 12.1* 14.6* 14.3*  NEUTROABS 6.2  --  7.5  --  9.5*  HGB 7.1* 7.6* 8.1* 7.7* 7.7*  HCT 22.7* 24.0* 25.5* 24.1* 24.6*  MCV 88.7 87.9 87.3 86.1 87.5  PLT 853* 787* 786* 691* 675*   Basic Metabolic Panel: Recent Labs  Lab 07/13/18 0551 07/16/18 0323 07/19/18 0500  NA 132* 133* 134*  K 3.8 3.9 3.9  CL 93* 94* 94*  CO2 28 28 28   GLUCOSE 103* 103* 104*  BUN 18 19 18   CREATININE 0.67 0.70 0.65  CALCIUM 9.2 9.4 9.4   GFR: Estimated Creatinine Clearance: 110.3 mL/min (by C-G  formula based on SCr of 0.65 mg/dL). Liver Function Tests: No results for input(s): AST, ALT, ALKPHOS, BILITOT, PROT, ALBUMIN in the last 168 hours. No results for input(s): LIPASE, AMYLASE in the last 168 hours. No results for input(s): AMMONIA in the last 168 hours. Coagulation Profile: No results for input(s): INR, PROTIME in the last 168 hours. Cardiac Enzymes: No results for input(s): CKTOTAL, CKMB, CKMBINDEX, TROPONINI in the last 168 hours. BNP (last 3 results) No results for input(s): PROBNP in the last 8760 hours. HbA1C: No results for input(s): HGBA1C in the last 72 hours. CBG: No results  for input(s): GLUCAP in the last 168 hours. Lipid Profile: No results for input(s): CHOL, HDL, LDLCALC, TRIG, CHOLHDL, LDLDIRECT in the last 72 hours. Thyroid Function Tests: No results for input(s): TSH, T4TOTAL, FREET4, T3FREE, THYROIDAB in the last 72 hours. Anemia Panel: No results for input(s): VITAMINB12, FOLATE, FERRITIN, TIBC, IRON, RETICCTPCT in the last 72 hours. Sepsis Labs: No results for input(s): PROCALCITON, LATICACIDVEN in the last 168 hours.  No results found for this or any previous visit (from the past 240 hour(s)).       Radiology Studies: No results found.      Scheduled Meds: . diclofenac sodium  2 g Topical QID  . enoxaparin (LOVENOX) injection  40 mg Subcutaneous Daily  . esomeprazole  20 mg Oral QAC breakfast  . feeding supplement (ENSURE ENLIVE)  237 mL Oral TID BM  . ferrous sulfate  325 mg Oral BID WC  . folic acid  1 mg Oral Daily  . methocarbamol  500 mg Oral TID  . multivitamin with minerals  1 tablet Oral Daily  . polyethylene glycol  17 g Oral Daily  . senna-docusate  1 tablet Oral BID  . sodium chloride flush  10-40 mL Intracatheter Q12H  . thiamine  100 mg Oral Daily   Or  . thiamine  100 mg Intravenous Daily  . traMADol  50 mg Oral Q6H  . vitamin B-12  100 mcg Oral Daily   Continuous Infusions: . sodium chloride 10 mL/hr at 07/13/18 2348  . methocarbamol (ROBAXIN) IV    . vancomycin 750 mg (07/19/18 0121)     LOS: 29 days    Time spent: 15 minutes    Barb Merino, MD [facility] Pager [phone]  If 7PM-7AM, please contact night-coverage [email or web address] Password [facility] 07/19/2018, 9:26 AM

## 2018-07-19 NOTE — Plan of Care (Signed)
  Problem: Education: Goal: Knowledge of General Education information will improve Description Including pain rating scale, medication(s)/side effects and non-pharmacologic comfort measures Outcome: Progressing   Problem: Health Behavior/Discharge Planning: Goal: Ability to manage health-related needs will improve Outcome: Progressing   Problem: Clinical Measurements: Goal: Ability to maintain clinical measurements within normal limits will improve Outcome: Progressing Goal: Will remain free from infection Outcome: Progressing Goal: Diagnostic test results will improve Outcome: Progressing Goal: Respiratory complications will improve Outcome: Progressing Goal: Cardiovascular complication will be avoided Outcome: Progressing   Problem: Activity: Goal: Risk for activity intolerance will decrease Outcome: Progressing   Problem: Nutrition: Goal: Adequate nutrition will be maintained Outcome: Progressing   Problem: Coping: Goal: Level of anxiety will decrease Outcome: Progressing   Problem: Elimination: Goal: Will not experience complications related to bowel motility Outcome: Progressing Goal: Will not experience complications related to urinary retention Outcome: Progressing   Problem: Safety: Goal: Ability to remain free from injury will improve Outcome: Progressing   Problem: Skin Integrity: Goal: Risk for impaired skin integrity will decrease Outcome: Progressing   Problem: Fluid Volume: Goal: Hemodynamic stability will improve Outcome: Progressing   Problem: Clinical Measurements: Goal: Diagnostic test results will improve Outcome: Progressing Goal: Signs and symptoms of infection will decrease Outcome: Progressing   Problem: Respiratory: Goal: Ability to maintain adequate ventilation will improve Outcome: Progressing    Ival Bible, BSN, RN

## 2018-07-19 NOTE — Progress Notes (Signed)
Physical Therapy Treatment Patient Details Name: [name] MRN: [id number] DOB: [birth year] Today's Date: 07/19/2018    History of Present Illness Pt. with PMH of hep C, substance abuse; admitted on 06/19/2018, presented with complaint of back pain, was found to have musculoskeletal back pain as well as right upper extremity cellulitis; Patient had a debridement of his shoulder perfromed on 5/20.     PT Comments    Patient did a better job of staying relaxed and breathing today with transfers. He reported some syncope but not as bad as yesterday. Patient was only able to sit for about 10 minutes before having to get back to the bed.  Therapy spoke with him again about the improtance of sitting up at the edge of the bed formeals. He did muvch better with bed mobility.   Follow Up Recommendations  SNF     Equipment Recommendations  Other (comment)    Recommendations for Other Services Rehab consult     Precautions / Restrictions Precautions Precautions: Shoulder Type of Shoulder Precautions: Active protocol: PROM/AROM shoulder FF to 90, abduct to 60, ER to 30; verbal order per Dr. Marlou Sa on 5/25 AROM elbow/wrist/hand as tolerated  Shoulder Interventions: Shoulder sling/immobilizer;For comfort Restrictions Weight Bearing Restrictions: Yes RUE Weight Bearing: Non weight bearing LLE Weight Bearing: Weight bearing as tolerated    Mobility  Bed Mobility Overal bed mobility: Needs Assistance Bed Mobility: Supine to Sit;Sit to Supine     Supine to sit: Min assist     General bed mobility comments: significant improvement in mobility today. Patient able to sit up at the edge of the bed with little assistance   Transfers Overall transfer level: Needs assistance   Transfers: Sit to/from Stand Sit to Stand: Mod assist;+2 safety/equipment;+2 physical assistance         General transfer comment: continues to require mod a for stand pivot. Once to the chair able to astand with mod  a to have alleyvn pad placed on his bottom. He required min gaurd to stand while pad was placed   Ambulation/Gait                 Stairs             Wheelchair Mobility    Modified Rankin (Stroke Patients Only)       Balance Overall balance assessment: Needs assistance Sitting-balance support: No upper extremity supported;Feet supported Sitting balance-Leahy Scale: Fair     Standing balance support: Bilateral upper extremity supported;During functional activity Standing balance-Leahy Scale: Poor Standing balance comment: heavily reliant on external support                            Cognition Arousal/Alertness: Awake/alert Behavior During Therapy: WFL for tasks assessed/performed Overall Cognitive Status: Within Functional Limits for tasks assessed                                 General Comments: still self limiting but better today       Exercises      General Comments        Pertinent Vitals/Pain Pain Assessment: Faces Faces Pain Scale: Hurts even more Pain Location: L knee/ low back Pain Descriptors / Indicators: Sore;Grimacing;Guarding;Moaning    Home Living                      Prior Function  PT Goals (current goals can now be found in the care plan section) Acute Rehab PT Goals Patient Stated Goal: to progress mobility PT Goal Formulation: With patient Time For Goal Achievement: 07/05/18 Potential to Achieve Goals: Good Progress towards PT goals: Progressing toward goals    Frequency    Min 3X/week      PT Plan Current plan remains appropriate    Co-evaluation PT/OT/SLP Co-Evaluation/Treatment: Yes Reason for Co-Treatment: Complexity of the patient's impairments (multi-system involvement);Necessary to address cognition/behavior during functional activity;For patient/therapist safety;To address functional/ADL transfers PT goals addressed during session: Mobility/safety with  mobility;Balance;Proper use of DME;Strengthening/ROM        AM-PAC PT "6 Clicks" Mobility   Outcome Measure  Help needed turning from your back to your side while in a flat bed without using bedrails?: A Lot Help needed moving from lying on your back to sitting on the side of a flat bed without using bedrails?: A Lot Help needed moving to and from a bed to a chair (including a wheelchair)?: A Lot Help needed standing up from a chair using your arms (e.g., wheelchair or bedside chair)?: A Lot Help needed to walk in hospital room?: Total Help needed climbing 3-5 steps with a railing? : Total 6 Click Score: 10    End of Session Equipment Utilized During Treatment: Gait belt;Other (comment) Activity Tolerance: Patient limited by pain Patient left: with call bell/phone within reach;with SCD's reapplied;in chair Nurse Communication: Mobility status PT Visit Diagnosis: Unsteadiness on feet (R26.81);Muscle weakness (generalized) (M62.81);Pain;Other abnormalities of gait and mobility (R26.89) Pain - Right/Left: Right Pain - part of body: Arm     Time: 1203-1226 PT Time Calculation (min) (ACUTE ONLY): 23 min  Charges:  $Therapeutic Activity: 8-22 mins                        Dessie Comaavid J Prentiss Hammett PT DPT  07/19/2018, 1:55 PM

## 2018-07-19 NOTE — Progress Notes (Signed)
Pt refusing morning labs due to PICC w/o blood return. IV team assessing PICC at this time. Dr. Hilbert Bible informed.

## 2018-07-19 NOTE — Plan of Care (Signed)
Progressing towards goals

## 2018-07-19 NOTE — Progress Notes (Signed)
Occupational Therapy Treatment Patient Details Name: [name] MRN: [id number] DOB: 03/08/1965 Today's Date: 07/19/2018    History of present illness Pt. with PMH of hep C, substance abuse; admitted on 06/19/2018, presented with complaint of back pain, was found to have musculoskeletal back pain as well as right upper extremity cellulitis; Patient had a debridement of his shoulder perfromed on 5/20.    OT comments  Pt performing transfer from bed to recliner with modA+2 for stability. Pt performing RUE HEP with AROM/AAROM with shoulder flex/abd and shoulder ER, 10 reps seated. Pt performing ADL with moderate assist to maxA for LB ADL. Pt self ROM to RUE in all 3 motions; RUE elbow through digits, WFLs. Pt would benefit from continued OT skilled services for ADL,mobility and safety in SNF/CIR for acute needs. OT to follow acutely.      Follow Up Recommendations  SNF;CIR;Supervision/Assistance - 24 hour    Equipment Recommendations  Other (comment)(to be determined)    Recommendations for Other Services      Precautions / Restrictions Precautions Precautions: Shoulder Type of Shoulder Precautions: Active protocol: PROM/AROM shoulder FF to 90, abduct to 60, ER to 30; verbal order per Dr. August Saucerean on 5/25 AROM elbow/wrist/hand as tolerated  Shoulder Interventions: Shoulder sling/immobilizer;For comfort Restrictions Weight Bearing Restrictions: Yes RUE Weight Bearing: Non weight bearing LLE Weight Bearing: Weight bearing as tolerated       Mobility Bed Mobility Overal bed mobility: Needs Assistance Bed Mobility: Supine to Sit;Sit to Supine     Supine to sit: Min assist     General bed mobility comments: significant improvement in mobility today. Patient able to sit up at the edge of the bed with little assistance   Transfers Overall transfer level: Needs assistance   Transfers: Sit to/from Stand Sit to Stand: Mod assist;+2 safety/equipment;+2 physical assistance          General transfer comment: continues to require mod a for stand pivot. Once to the chair able to astand with mod a to have alleyvn pad placed on his bottom. He required min gaurd to stand while pad was placed     Balance Overall balance assessment: Needs assistance Sitting-balance support: No upper extremity supported;Feet supported Sitting balance-Leahy Scale: Fair     Standing balance support: Bilateral upper extremity supported;During functional activity Standing balance-Leahy Scale: Poor Standing balance comment: heavily reliant on external support                           ADL either performed or assessed with clinical judgement   ADL Overall ADL's : Needs assistance/impaired                                     Functional mobility during ADLs: Moderate assistance;+2 for safety/equipment General ADL Comments: stand pivot to chair     Vision       Perception     Praxis      Cognition Arousal/Alertness: Awake/alert Behavior During Therapy: WFL for tasks assessed/performed Overall Cognitive Status: Within Functional Limits for tasks assessed                                 General Comments: self limits, but interested in movement of RUE        Exercises General Exercises - Upper Extremity Shoulder Flexion: AROM;AAROM;10 reps;Seated  Shoulder ABduction: AAROM;10 reps;Seated Elbow Flexion: AROM;10 reps;Seated Elbow Extension: AROM;10 reps;Seated Wrist Flexion: AROM;10 reps;Seated Wrist Extension: AROM;10 reps;Seated   Shoulder Instructions       General Comments Pt tolerating AAROM and education on self ROM with LUE assisting.    Pertinent Vitals/ Pain       Pain Assessment: Faces Faces Pain Scale: Hurts even more Pain Location: L knee/ low back Pain Descriptors / Indicators: Sore;Grimacing;Guarding;Moaning  Home Living                                          Prior Functioning/Environment               Frequency  Min 3X/week        Progress Toward Goals  OT Goals(current goals can now be found in the care plan section)  Progress towards OT goals: Progressing toward goals  Acute Rehab OT Goals Patient Stated Goal: to progress mobility OT Goal Formulation: With patient Time For Goal Achievement: 07/31/18 Potential to Achieve Goals: Good ADL Goals Pt Will Perform Grooming: with modified independence;sitting Pt Will Perform Upper Body Dressing: with modified independence;sitting Pt Will Perform Lower Body Dressing: with modified independence;sitting/lateral leans;sit to/from stand Pt Will Transfer to Toilet: with min guard assist;ambulating;bedside commode Pt/caregiver will Perform Home Exercise Program: Both right and left upper extremity;With Supervision;With written HEP provided Additional ADL Goal #1: Pt will sit EOB for ADL tasks with set-upA and fair sitting balance  Plan Discharge plan remains appropriate    Co-evaluation    PT/OT/SLP Co-Evaluation/Treatment: Yes Reason for Co-Treatment: For patient/therapist safety   OT goals addressed during session: ADL's and self-care;Strengthening/ROM      AM-PAC OT "6 Clicks" Daily Activity     Outcome Measure   Help from another person eating meals?: None Help from another person taking care of personal grooming?: A Little Help from another person toileting, which includes using toliet, bedpan, or urinal?: A Lot Help from another person bathing (including washing, rinsing, drying)?: A Lot Help from another person to put on and taking off regular upper body clothing?: A Little Help from another person to put on and taking off regular lower body clothing?: Total 6 Click Score: 15    End of Session Equipment Utilized During Treatment: Gait belt;Rolling walker CPM Left Knee CPM Left Knee: On  OT Visit Diagnosis: Muscle weakness (generalized) (M62.81);Pain Pain - Right/Left: Right Pain - part of body:  Shoulder;Leg   Activity Tolerance Patient tolerated treatment well   Patient Left in chair;with call bell/phone within reach;with chair alarm set   Nurse Communication Mobility status        Time: 6767-2094 OT Time Calculation (min): 27 min  Charges: OT General Charges $OT Visit: 1 Visit OT Treatments $Therapeutic Exercise: 8-22 mins  Darryl Nestle) Marsa Aris OTR/L Acute Rehabilitation Services Pager: [phone] Office: [place] 07/19/2018, 5:07 PM

## 2018-07-19 NOTE — Progress Notes (Signed)
Orthopedic Tech Progress Note Patient Details:  [name] 1965/04/30 [id number] Added a ice pack with a towel under it to his knee CPM Left Knee CPM Left Knee: On Left Knee Flexion (Degrees): 50 Left Knee Extension (Degrees): 6 Additional Comments: patient can not tolerate flexion  Post Interventions Patient Tolerated: Well Instructions Provided: Care of device, Adjustment of device  [name] 07/19/2018, 5:14 PM

## 2018-07-20 NOTE — Plan of Care (Signed)
Progressing towards goals

## 2018-07-20 NOTE — Progress Notes (Signed)
PROGRESS NOTE    [name]  YNW:[id number] DOB: May 04, 1965 DOA: 06/19/2018 PCP: Patient, No Pcp Per    Brief Narrative:   Patient in the hospital to finish IV antibiotics.  Assessment & Plan:   Principal Problem:   Septic arthritis of multiple joints (HCC) Active Problems:   Hepatitis C antibody test positive   Cigarette smoker   Intractable back pain   AKI (acute kidney injury) ([place])   Spinal stenosis of lumbosacral region   GERD (gastroesophageal reflux disease)   Mild protein-calorie malnutrition (HCC)   Septic arthritis of knee, left (HCC)   Acute medial meniscal tear, left, initial encounter   Polysubstance abuse (HCC)   Normocytic anemia   Effusion of left elbow   Recurrent boils   Discitis of lumbosacral region   Psoas abscess, right (HCC)   Multiple abscesses of right shoulder   Chronic infection of left knee (HCC)   No change in plan today. Continue IV vancomycin as dosed by pharmacy.   DVT prophylaxis: Lovenox Code Status: Full code Family Communication: None Disposition Plan: Stay in the hospital for IV antibiotics   Consultants:   None  Procedures:   Multiple procedures  Antimicrobials:   Vancomycin, 06/23/2018---   Subjective: Seen and examined.  No overnight events.  Objective: Vitals:   07/19/18 2007 07/19/18 2345 07/20/18 0320 07/20/18 0850  BP: (!) 144/86 137/80 (!) 125/94 127/78  Pulse: (!) 102 (!) 103 (!) 102 (!) 101  Resp: 14 12 13 14   Temp: (!) 97.5 F (36.4 C) 97.9 F (36.6 C) 97.8 F (36.6 C) 97.8 F (36.6 C)  TempSrc: Oral Oral Oral Oral  SpO2: 100% 98% 99% 100%  Weight:      Height:        Intake/Output Summary (Last 24 hours) at 07/20/2018 0918 Last data filed at 07/20/2018 0800 Gross per 24 hour  Intake 1508.63 ml  Output 1850 ml  Net -341.37 ml   Filed Weights   06/19/18 1303 06/23/18 1708 06/28/18 1543  Weight: 74.8 kg 74.8 kg 74.8 kg    Examination:  General exam: Appears calm and comfortable   Respiratory system: Clear to auscultation. Respiratory effort normal. Cardiovascular system: S1 & S2 heard, RRR. No JVD, murmurs, rubs, gallops or clicks. No pedal edema. Gastrointestinal system: Abdomen is nondistended, soft and nontender. No organomegaly or masses felt. Normal bowel sounds heard. Central nervous system: Alert and oriented. No focal neurological deficits. Extremities: Symmetric 5 x 5 power.  Right shoulder with surgical scar clean and dry.  With a stiffness.  Left knee with surgical scar clean dry with knee stiffness. Skin: No rashes, lesions or ulcers, Psychiatry: Judgement and insight appear normal. Mood & affect appropriate.     Data Reviewed: I have personally reviewed following labs and imaging studies  CBC: Recent Labs  Lab 07/15/18 0744 07/16/18 0323 07/17/18 2000 07/19/18 0522  WBC 11.5* 12.1* 14.6* 14.3*  NEUTROABS  --  7.5  --  9.5*  HGB 7.6* 8.1* 7.7* 7.7*  HCT 24.0* 25.5* 24.1* 24.6*  MCV 87.9 87.3 86.1 87.5  PLT 787* 786* 691* 657*   Basic Metabolic Panel: Recent Labs  Lab 07/16/18 0323 07/19/18 0500  NA 133* 134*  K 3.9 3.9  CL 94* 94*  CO2 28 28  GLUCOSE 103* 104*  BUN 19 18  CREATININE 0.70 0.65  CALCIUM 9.4 9.4   GFR: Estimated Creatinine Clearance: 110.3 mL/min (by C-G formula based on SCr of 0.65 mg/dL). Liver Function Tests: No results for  input(s): AST, ALT, ALKPHOS, BILITOT, PROT, ALBUMIN in the last 168 hours. No results for input(s): LIPASE, AMYLASE in the last 168 hours. No results for input(s): AMMONIA in the last 168 hours. Coagulation Profile: No results for input(s): INR, PROTIME in the last 168 hours. Cardiac Enzymes: No results for input(s): CKTOTAL, CKMB, CKMBINDEX, TROPONINI in the last 168 hours. BNP (last 3 results) No results for input(s): PROBNP in the last 8760 hours. HbA1C: No results for input(s): HGBA1C in the last 72 hours. CBG: No results for input(s): GLUCAP in the last 168 hours. Lipid Profile: No  results for input(s): CHOL, HDL, LDLCALC, TRIG, CHOLHDL, LDLDIRECT in the last 72 hours. Thyroid Function Tests: No results for input(s): TSH, T4TOTAL, FREET4, T3FREE, THYROIDAB in the last 72 hours. Anemia Panel: No results for input(s): VITAMINB12, FOLATE, FERRITIN, TIBC, IRON, RETICCTPCT in the last 72 hours. Sepsis Labs: No results for input(s): PROCALCITON, LATICACIDVEN in the last 168 hours.  No results found for this or any previous visit (from the past 240 hour(s)).       Radiology Studies: No results found.      Scheduled Meds: . diclofenac sodium  2 g Topical QID  . enoxaparin (LOVENOX) injection  40 mg Subcutaneous Daily  . esomeprazole  20 mg Oral QAC breakfast  . feeding supplement (ENSURE ENLIVE)  237 mL Oral TID BM  . ferrous sulfate  325 mg Oral BID WC  . folic acid  1 mg Oral Daily  . methocarbamol  500 mg Oral TID  . multivitamin with minerals  1 tablet Oral Daily  . polyethylene glycol  17 g Oral Daily  . senna-docusate  1 tablet Oral BID  . sodium chloride flush  10-40 mL Intracatheter Q12H  . thiamine  100 mg Oral Daily   Or  . thiamine  100 mg Intravenous Daily  . traMADol  50 mg Oral Q6H  . vitamin B-12  100 mcg Oral Daily   Continuous Infusions: . sodium chloride 10 mL/hr at 07/20/18 0513  . methocarbamol (ROBAXIN) IV    . vancomycin Stopped (07/20/18 0150)     LOS: 30 days    Time spent: 15 minutes    Dorcas CarrowKuber Stefan Markarian, MD [facility] Pager [phone]  If 7PM-7AM, please contact night-coverage [email or web address] Password [email or web address] 07/20/2018, 9:18 AM

## 2018-07-21 NOTE — Progress Notes (Signed)
Physical Therapy Treatment Patient Details Name: Clifford MassedJames Chan MRN: [id number] DOB: 11/08/1965 Today's Date: 07/21/2018    History of Present Illness Pt. with PMH of hep C, substance abuse; admitted on 06/19/2018, presented with complaint of back pain, was found to have musculoskeletal back pain as well as right upper extremity cellulitis; Patient had a debridement of his shoulder perfromed on 5/20.     PT Comments    Patient was bale to take steps today. He is still very hesitant to weight bear on the the left leg. He requires freqeunt cuing to breath. He was placed in a chair sitting on a pillow with his leg supported. He reported less syncope today with transfers. He is making progress. He reported increased pain in weight bearing but he was able to do it. He would benefit from further therapy.   Follow Up Recommendations  SNF     Equipment Recommendations  Other (comment)    Recommendations for Other Services Rehab consult     Precautions / Restrictions Precautions Precautions: Shoulder Type of Shoulder Precautions: Active protocol: PROM/AROM shoulder FF to 90, abduct to 60, ER to 30; verbal order per Dr. August Saucerean on 5/25 AROM elbow/wrist/hand as tolerated  Shoulder Interventions: Shoulder sling/immobilizer;For comfort Required Braces or Orthoses: Sling Restrictions Weight Bearing Restrictions: Yes RUE Weight Bearing: Non weight bearing LLE Weight Bearing: Weight bearing as tolerated    Mobility  Bed Mobility Overal bed mobility: Needs Assistance Bed Mobility: Supine to Sit;Sit to Supine     Supine to sit: Min guard     General bed mobility comments: Patient able to sit up on the edge of the bed with cuing for leg and hand placement   Transfers Overall transfer level: Needs assistance Equipment used: Rolling walker (2 wheeled) Transfers: Sit to/from Stand Sit to Stand: Mod assist;+2 physical assistance;+2 safety/equipment         General transfer comment: stood with  RW and able to transfer to the chair. Patient took pillow out from under his foot   Ambulation/Gait Ambulation/Gait assistance: Mod assist;+2 safety/equipment;+2 physical assistance Gait Distance (Feet): 3 Feet Assistive device: Rolling walker (2 wheeled) Gait Pattern/deviations: Step-through pattern Gait velocity: decreased   General Gait Details: Patient able to take steps today. He reported pain in his right knee. He required cuing for foot placement. He kept a narrow base of support. He also needed frequent cuing to breath.    Stairs             Wheelchair Mobility    Modified Rankin (Stroke Patients Only)       Balance Overall balance assessment: Needs assistance Sitting-balance support: No upper extremity supported;Feet supported Sitting balance-Leahy Scale: Fair Sitting balance - Comments: needing supervision for static balance, holds onto EOB for support Postural control: Left lateral lean Standing balance support: Bilateral upper extremity supported;During functional activity Standing balance-Leahy Scale: Poor Standing balance comment: heavily reliant on external support                            Cognition Arousal/Alertness: Awake/alert Behavior During Therapy: WFL for tasks assessed/performed Overall Cognitive Status: Within Functional Limits for tasks assessed                                 General Comments: self limits, but interested in movement of RUE      Exercises      General  Comments General comments (skin integrity, edema, etc.): swelling in left knee appears to be inproving       Pertinent Vitals/Pain Pain Assessment: Faces Faces Pain Scale: Hurts little more Pain Location: L knee/ low back Pain Descriptors / Indicators: Sore;Grimacing;Guarding;Moaning Pain Intervention(s): Limited activity within patient's tolerance;Monitored during session;Patient requesting pain meds-RN notified    Home Living                       Prior Function            PT Goals (current goals can now be found in the care plan section) Acute Rehab PT Goals Patient Stated Goal: to progress mobility PT Goal Formulation: With patient Time For Goal Achievement: 07/05/18 Potential to Achieve Goals: Good Progress towards PT goals: Progressing toward goals    Frequency    Min 3X/week      PT Plan Current plan remains appropriate    Co-evaluation              AM-PAC PT "6 Clicks" Mobility   Outcome Measure  Help needed turning from your back to your side while in a flat bed without using bedrails?: A Lot Help needed moving from lying on your back to sitting on the side of a flat bed without using bedrails?: A Lot Help needed moving to and from a bed to a chair (including a wheelchair)?: A Lot Help needed standing up from a chair using your arms (e.g., wheelchair or bedside chair)?: A Lot Help needed to walk in hospital room?: Total Help needed climbing 3-5 steps with a railing? : Total 6 Click Score: 10    End of Session Equipment Utilized During Treatment: Gait belt;Other (comment) Activity Tolerance: Patient limited by pain Patient left: with call bell/phone within reach;with SCD's reapplied;in chair Nurse Communication: Mobility status PT Visit Diagnosis: Unsteadiness on feet (R26.81);Muscle weakness (generalized) (M62.81);Pain;Other abnormalities of gait and mobility (R26.89) Pain - Right/Left: Right Pain - part of body: Arm     Time: 1126-1150 PT Time Calculation (min) (ACUTE ONLY): 24 min  Charges:  $Therapeutic Exercise: 23-37 mins                      Carney Living PT DPT  07/21/2018, 1:18 PM

## 2018-07-21 NOTE — Progress Notes (Signed)
PROGRESS NOTE    [name]  RKY:[id number] DOB: 05-18-1965 DOA: 06/19/2018 PCP: Patient, No Pcp Per    Brief Narrative:  53 year old with past medical history significant for hepatitis C, who presented to the ED with complaints of increasing back pain, radiating down to the left leg initially but subsequently both legs.  Patient was having difficulty standing and walking.  Per patient the pain started after he attempted to lift a lawnmower over a week ago.  Evaluation in the ED, MRI showed severe foraminal narrowing at level L5-S1.   He subsequently was admitted with sepsis secondary to right upper extremity cellulitis and was started on IV antibiotics, however cellulitis did not improve.  He did have a CT scan which showed intra-facial abscess in the right shoulder along with septic arthritis of the [facility] joint.  Patient had debridement of multiple abscess. During hospitalization patient began to have back pain and right thigh weakness.  MRI of the lumbar spine which showed discitis as well as septic arthritis and paraspinal abscess. During hospitalization he did have left knee swelling, he had arthrocentesis which showed gram-positive cocci and had left knee washout. Patient was evaluated by ID who is recommending 6 to 8 weeks of IV antibiotics.  Vancomycin is started 06/23/2018---  Patient in the hospital to finish IV antibiotics.  Assessment & Plan:   Principal Problem:   Septic arthritis of multiple joints (HCC) Active Problems:   Hepatitis C antibody test positive   Cigarette smoker   Intractable back pain   AKI (acute kidney injury) ([place])   Spinal stenosis of lumbosacral region   GERD (gastroesophageal reflux disease)   Mild protein-calorie malnutrition (HCC)   Septic arthritis of knee, left (HCC)   Acute medial meniscal tear, left, initial encounter   Polysubstance abuse (HCC)   Normocytic anemia   Effusion of left elbow   Recurrent boils   Discitis of lumbosacral region  Psoas abscess, right (HCC)   Multiple abscesses of right shoulder   Chronic infection of left knee (HCC)  1-Sepsis secondary to cellulitis of the right upper extremity multiple abscess large multiple lumbar abscess and septic arthritis of the left knee: -Patient presented with tachycardia, leukocytosis and back pain. -MRI 5/5 showed severe lumbar spinal stenosis.  MRI 5/19 showed L4 S1 discitis, L4-L5 septic arthritis multiple paraspinal abscess. -CT right shoulder showed  intramuscular and infra facial abscess along with septic arthritis of the [facility] joint -Neurosurgery was consulted and  Recommended repeat MRI near the end of antibiotics. -ID consulted, recommend minimum of 6 weeks of IV antibiotics. -Orthopedic consulted.  Patient is status post left knee arthrocentesis and washout.  S/p irrigation and debridement of right shoulder, acromial medial clavicular joint irrigation and debridement on the right. -Echocardiogram was negative for vegetation. -PT consulted recommending SNF. -suture left knee removed. CPM machine ordered. Added low dose morphine prior patient using CPM machine.  Tolerating CPM machine.  Continue with IV antibiotics.   Leukocytosis;  No obvious source.  Will monitor. Will recheck in morning.  AKI: -Creatinine on admission was 3.5.  Normalized.  Normocytic anemia: hb stable. Iron deficiency anemia.  Start ferrous sulfate and B12 supplement.  Advised him to follow up with PCP for colonoscopy.  Hb remains a stable.  Hyponatremia: Improved with IV fluids.  Currently remains a stable.  Constipation: Continue with bowel regimen  Polysubstance abuse -Admits to using cocaine prior to admission. Refuses he is ever used IV heroin. -UDS positive for benzodiazepines and cocaine.  No change  in plan today. Continue IV vancomycin as dosed by pharmacy.   DVT prophylaxis: Lovenox Code Status: Full code Family Communication: None Disposition Plan: Stay in the  hospital for IV antibiotics   Consultants:   None  Procedures:   Multiple procedures  Antimicrobials:   Vancomycin, 06/23/2018---   Subjective: Seen and examined.  No overnight events.  Objective: Vitals:   07/20/18 2008 07/21/18 0025 07/21/18 0318 07/21/18 0837  BP: 131/81 134/79 126/76 130/78  Pulse: (!) 102 (!) 102 98 (!) 105  Resp: 15 14 15 18   Temp: 98 F (36.7 C) 98 F (36.7 C) 98 F (36.7 C) 97.7 F (36.5 C)  TempSrc: Oral Oral Oral Oral  SpO2: 97% 98% 98% 98%  Weight:      Height:        Intake/Output Summary (Last 24 hours) at 07/21/2018 0913 Last data filed at 07/21/2018 0402 Gross per 24 hour  Intake 1002.84 ml  Output 2150 ml  Net -1147.16 ml   Filed Weights   06/19/18 1303 06/23/18 1708 06/28/18 1543  Weight: 74.8 kg 74.8 kg 74.8 kg    Examination:  General exam: Appears calm and comfortable  Respiratory system: Clear to auscultation. Respiratory effort normal. Cardiovascular system: S1 & S2 heard, RRR. No JVD, murmurs, rubs, gallops or clicks. No pedal edema. Gastrointestinal system: Abdomen is nondistended, soft and nontender. No organomegaly or masses felt. Normal bowel sounds heard. Central nervous system: Alert and oriented. No focal neurological deficits. Extremities: Symmetric 5 x 5 power.  Right shoulder with surgical scar clean and dry.  With a stiffness.  Left knee with surgical scar clean dry with knee stiffness. Skin: No rashes, lesions or ulcers, Psychiatry: Judgement and insight appear normal. Mood & affect appropriate.     Data Reviewed: I have personally reviewed following labs and imaging studies  CBC: Recent Labs  Lab 07/15/18 0744 07/16/18 0323 07/17/18 2000 07/19/18 0522  WBC 11.5* 12.1* 14.6* 14.3*  NEUTROABS  --  7.5  --  9.5*  HGB 7.6* 8.1* 7.7* 7.7*  HCT 24.0* 25.5* 24.1* 24.6*  MCV 87.9 87.3 86.1 87.5  PLT 787* 786* 691* 675*   Basic Metabolic Panel: Recent Labs  Lab 07/16/18 0323 07/19/18 0500  NA  133* 134*  K 3.9 3.9  CL 94* 94*  CO2 28 28  GLUCOSE 103* 104*  BUN 19 18  CREATININE 0.70 0.65  CALCIUM 9.4 9.4   GFR: Estimated Creatinine Clearance: 110.3 mL/min (by C-G formula based on SCr of 0.65 mg/dL). Liver Function Tests: No results for input(s): AST, ALT, ALKPHOS, BILITOT, PROT, ALBUMIN in the last 168 hours. No results for input(s): LIPASE, AMYLASE in the last 168 hours. No results for input(s): AMMONIA in the last 168 hours. Coagulation Profile: No results for input(s): INR, PROTIME in the last 168 hours. Cardiac Enzymes: No results for input(s): CKTOTAL, CKMB, CKMBINDEX, TROPONINI in the last 168 hours. BNP (last 3 results) No results for input(s): PROBNP in the last 8760 hours. HbA1C: No results for input(s): HGBA1C in the last 72 hours. CBG: No results for input(s): GLUCAP in the last 168 hours. Lipid Profile: No results for input(s): CHOL, HDL, LDLCALC, TRIG, CHOLHDL, LDLDIRECT in the last 72 hours. Thyroid Function Tests: No results for input(s): TSH, T4TOTAL, FREET4, T3FREE, THYROIDAB in the last 72 hours. Anemia Panel: No results for input(s): VITAMINB12, FOLATE, FERRITIN, TIBC, IRON, RETICCTPCT in the last 72 hours. Sepsis Labs: No results for input(s): PROCALCITON, LATICACIDVEN in the last 168 hours.  No  results found for this or any previous visit (from the past 240 hour(s)).       Radiology Studies: No results found.      Scheduled Meds: . diclofenac sodium  2 g Topical QID  . enoxaparin (LOVENOX) injection  40 mg Subcutaneous Daily  . esomeprazole  20 mg Oral QAC breakfast  . feeding supplement (ENSURE ENLIVE)  237 mL Oral TID BM  . ferrous sulfate  325 mg Oral BID WC  . folic acid  1 mg Oral Daily  . methocarbamol  500 mg Oral TID  . multivitamin with minerals  1 tablet Oral Daily  . polyethylene glycol  17 g Oral Daily  . senna-docusate  1 tablet Oral BID  . sodium chloride flush  10-40 mL Intracatheter Q12H  . thiamine  100 mg  Oral Daily   Or  . thiamine  100 mg Intravenous Daily  . traMADol  50 mg Oral Q6H  . vitamin B-12  100 mcg Oral Daily   Continuous Infusions: . sodium chloride 10 mL/hr at 07/20/18 0513  . methocarbamol (ROBAXIN) IV    . vancomycin 750 mg (07/21/18 0047)     LOS: 31 days    Time spent: 15 minutes    Dorcas CarrowKuber Kweli Grassel, MD [facility] Pager [phone]  If 7PM-7AM, please contact night-coverage [email or web address] Password [email or web address] 07/21/2018, 9:13 AM

## 2018-07-22 LAB — BASIC METABOLIC PANEL
Anion gap: 12 (ref 5–15)
BUN: 17 mg/dL (ref 6–20)
CO2: 28 mmol/L (ref 22–32)
Calcium: 9.4 mg/dL (ref 8.9–10.3)
Chloride: 94 mmol/L — ABNORMAL LOW (ref 98–111)
Creatinine, Ser: 0.7 mg/dL (ref 0.61–1.24)
GFR calc Af Amer: >60 mL/min (ref >60)
GFR calc non Af Amer: >60 mL/min (ref >60)
Glucose, Bld: 97 mg/dL (ref 70–99)
Potassium: 3.8 mmol/L (ref 3.5–5.1)
Sodium: 134 mmol/L — ABNORMAL LOW (ref 135–145)

## 2018-07-22 LAB — CBC WITH DIFFERENTIAL/PLATELET
Abs Immature Granulocytes: 0.27 10*3/uL — ABNORMAL HIGH (ref 0.00–0.07)
Basophils Absolute: 0.1 10*3/uL (ref 0.0–0.1)
Basophils Relative: 1 %
Eosinophils Absolute: 0.1 10*3/uL (ref 0.0–0.5)
Eosinophils Relative: 1 %
HCT: 24.2 % — ABNORMAL LOW (ref 39.0–52.0)
Hemoglobin: 7.6 g/dL — ABNORMAL LOW (ref 13.0–17.0)
Immature Granulocytes: 2 %
Lymphocytes Relative: 24 %
Lymphs Abs: 3.2 10*3/uL (ref 0.7–4.0)
MCH: 26.8 pg (ref 26.0–34.0)
MCHC: 31.4 g/dL (ref 30.0–36.0)
MCV: 85.2 fL (ref 80.0–100.0)
Monocytes Absolute: 0.9 10*3/uL (ref 0.1–1.0)
Monocytes Relative: 6 %
Neutro Abs: 9 10*3/uL — ABNORMAL HIGH (ref 1.7–7.7)
Neutrophils Relative %: 66 %
Platelets: 649 10*3/uL — ABNORMAL HIGH (ref 150–400)
RBC: 2.84 MIL/uL — ABNORMAL LOW (ref 4.22–5.81)
RDW: 17.2 % — ABNORMAL HIGH (ref 11.5–15.5)
WBC: 13.5 10*3/uL — ABNORMAL HIGH (ref 4.0–10.5)
nRBC: 0 % (ref 0.0–0.2)

## 2018-07-22 LAB — VANCOMYCIN, TROUGH: Vancomycin Tr: 13 ug/mL — ABNORMAL LOW (ref 15–20)

## 2018-07-22 LAB — VANCOMYCIN, PEAK: Vancomycin Pk: 26 ug/mL — ABNORMAL LOW (ref 30–40)

## 2018-07-22 MED ORDER — METOPROLOL TARTRATE 12.5 MG HALF TABLET
12.5000 mg | ORAL_TABLET | Freq: Every day | ORAL | Status: DC
  Administered 2018-07-22 – 2018-07-24 (×3): 12.5 mg via ORAL
  Filled 2018-07-22 (×3): qty 1

## 2018-07-22 NOTE — Progress Notes (Signed)
Pharmacy Antibiotic Note  [name] is a 53 y.o. male admitted on 06/19/2018 with back pain, now with septic arthritis of multiple joints. Pharmacy has been consulted for vancomycin dosing. Patient is s/p I&D of R shoulder and the [facility] joint on 5/20.  A Vancomycin peak resulted at 26 mcg/ml today (true peak 31.9 mcg/ml) and trough at 13 mcg/ml (true trough 11.9 mcg/ml) with a calculated AUC of 489 which is within goal range.   The current dosing remains appropriate for this patient who is completing 6 weeks of IV antibiotics. OPAT has been entered, no changes for now.   Plan: -Continue vancomycin 750mg  IV q12h -Monitor renal function, vancomycin levels as needed  Height: 5\' 10"  (177.8 cm) Weight: 165 lb (74.8 kg) IBW/kg (Calculated) : 73  Temp (24hrs), Avg:98 F (36.7 C), Min:97.6 F (36.4 C), Max:98.4 F (36.9 C)  Recent Labs  Lab 07/16/18 0323 07/17/18 2000 07/19/18 0500 07/19/18 0522 07/22/18 0033 07/22/18 0546 07/22/18 1330  WBC 12.1* 14.6*  --  14.3* 13.5*  --   --   CREATININE 0.70  --  0.65  --   --  0.70  --   VANCOTROUGH  --   --   --   --   --   --  13*  VANCOPEAK  --   --   --   --   --  26*  --     Estimated Creatinine Clearance: 110.3 mL/min (by C-G formula based on SCr of 0.7 mg/dL).    No Known Allergies   Vanc 5/15 >> CTX 5/12 >> 5/16  5/11 covid - negative 5/12 BCx - negative 5/14 synovial fluid L knee - GPC on stain >> negative 5/15 synovial fluid L knee - GPC on stain >> negative 5/20 BCx: neg 5/20 posterior shoulder abcess - neg 5/20 AC fluid joint - neg  Thank you for allowing pharmacy to be a part of this patient's care.  Alycia Rossetti, PharmD, BCPS Clinical Pharmacist Clinical phone for 07/22/2018: [phone] 07/22/2018 2:56 PM   **Pharmacist phone directory can now be found on [email or web address] ([email or web address]).  Listed under [place].

## 2018-07-22 NOTE — Progress Notes (Signed)
PROGRESS NOTE    [name]  BTD:[id number] DOB: 1965/06/20 DOA: 06/19/2018 PCP: Patient, No Pcp Per   Brief Narrative:53 year old with past medical history significant for hepatitis C, who presented to the ED with complaints of increasing back pain, radiating down to the left leg initially but subsequently both legs. Patient was having difficulty standing and walking. Per patient the pain started after he attempted to lift a lawnmower over a week ago. Evaluation in the ED, MRI showed severe foraminal narrowing at level L5-S1.   He subsequently was admitted with sepsis secondary to right upper extremity cellulitis and was started on IV antibiotics, however cellulitis did not improve. He did havea CT scan which showed intra-facial abscess in the right shoulder along with septic arthritis of the [facility] joint. Patient had debridement of multiple abscess. During hospitalization patient began to have back pain and right thigh weakness. MRI of the lumbar spine which showed discitis as well as septic arthritis and paraspinal abscess. During hospitalization he did have left knee swelling, he had arthrocentesis which showed gram-positive cocci and had left knee washout. Patient was evaluated by ID who is recommending 6 to 8 weeks of IV antibiotics.  Vancomycin is started 06/23/2018---  Patient in the hospital to finish IV antibiotics  07/22/2018 patient complains of taking few steps to walk yesterday but was not able to walk much due to bilateral lower back pain.  He denies any nausea vomiting diarrhea.   Assessment & Plan:   Principal Problem:   Septic arthritis of multiple joints (HCC) Active Problems:   Hepatitis C antibody test positive   Cigarette smoker   Intractable back pain   AKI (acute kidney injury) ([place])   Spinal stenosis of lumbosacral region   GERD (gastroesophageal reflux disease)   Mild protein-calorie malnutrition (HCC)   Septic arthritis of knee, left (HCC)   Acute  medial meniscal tear, left, initial encounter   Polysubstance abuse (HCC)   Normocytic anemia   Effusion of left elbow   Recurrent boils   Discitis of lumbosacral region   Psoas abscess, right (HCC)   Multiple abscesses of right shoulder   Chronic infection of left knee (HCC)   1-Sepsis secondary to cellulitis of the right upper extremity multiple abscess large multiple lumbar abscess and septic arthritis of the left knee: -Patient presented with tachycardia, leukocytosis and back pain. -MRI 5/5 showed severe lumbar spinal stenosis. MRI 5/19 showed L4 S1 discitis, L4-L5 septic arthritis multiple paraspinal abscess. -CT right shoulder showed intramuscular and infra facial abscess along with septic arthritis of the [facility] joint -Neurosurgery was consulted and Recommended repeat MRI near the end of antibiotics. -ID consulted, recommend minimum of 6 weeks of IV antibiotics.  Antibiotics started 06/23/2018. -Orthopedic consulted. Patient is status post left knee arthrocentesis and washout. S/p irrigation and debridement of right shoulder, acromial medial clavicular joint irrigation and debridement on the right. -Echocardiogram was negative for vegetation. -PT consulted recommending SNF. -suture left knee removed. CPM machine ordered. Added low dose morphine prior patient using CPM machine.  Tolerating CPM machine.   AKI: -Creatinine on admission was 3.5. Normalized.  Normocytic anemia: hb stable. Iron deficiency anemia.  Start ferrous sulfate and B12 supplement.  Advised him to follow up with PCP for colonoscopy.  Hb remains a stable.  Hyponatremia: Improved with IV fluids. Currently remains a stable.  Constipation:Continue with bowel regimen  Polysubstance abuse -Admits to using cocaine prior to admission. Refuses he is ever used IV heroin. -UDS positive for benzodiazepines and cocaine.  No change in plan today. Continue IV vancomycin as dosed by pharmacy.     Hypertension with tachycardia for the last few days I will start him on a beta-blocker he was not taking any medications at home for this.  DVT prophylaxis: Lovenox Code Status: Full code Family Communication: None Disposition Plan: Stay in the hospital for IV antibiotics    Estimated body mass index is 23.68 kg/m as calculated from the following:   Height as of this encounter: 5\' 10"  (1.778 m).   Weight as of this encounter: 74.8 kg.     Subjective: Resting in bed complains of lower back pain when trying to walk no nausea vomiting diarrhea constipation reported  Objective: Vitals:   07/21/18 1710 07/21/18 1938 07/22/18 0016 07/22/18 0726  BP: (!) 143/83 133/70 (!) 150/90 (!) 140/110  Pulse: (!) 105 (!) 106 (!) 101 (!) 107  Resp: 16 15 15 16   Temp: 97.6 F (36.4 C) 98.1 F (36.7 C) 98 F (36.7 C) 98.4 F (36.9 C)  TempSrc: Oral Oral Oral Oral  SpO2: 99% 98% 99% 97%  Weight:      Height:        Intake/Output Summary (Last 24 hours) at 07/22/2018 0845 Last data filed at 07/21/2018 1937 Gross per 24 hour  Intake -  Output 900 ml  Net -900 ml   Filed Weights   06/19/18 1303 06/23/18 1708 06/28/18 1543  Weight: 74.8 kg 74.8 kg 74.8 kg    Examination:  General exam: Appears calm and comfortable  Respiratory system: Clear to auscultation. Respiratory effort normal. Cardiovascular system: S1 & S2 heard, RRR. No JVD, murmurs, rubs, gallops or clicks. No pedal edema. Gastrointestinal system: Abdomen is nondistended, soft and nontender. No organomegaly or masses felt. Normal bowel sounds heard. Central nervous system: Alert and oriented. No focal neurological deficits. Extremities: Right shoulder incision clean and dry left knee incision clean and dry  skin: No rashes, lesions or ulcers Psychiatry: Judgement and insight appear normal. Mood & affect appropriate.     Data Reviewed: I have personally reviewed following labs and imaging studies  CBC: Recent Labs   Lab 07/16/18 0323 07/17/18 2000 07/19/18 0522 07/22/18 0033  WBC 12.1* 14.6* 14.3* 13.5*  NEUTROABS 7.5  --  9.5* 9.0*  HGB 8.1* 7.7* 7.7* 7.6*  HCT 25.5* 24.1* 24.6* 24.2*  MCV 87.3 86.1 87.5 85.2  PLT 786* 691* 675* 649*   Basic Metabolic Panel: Recent Labs  Lab 07/16/18 0323 07/19/18 0500 07/22/18 0546  NA 133* 134* 134*  K 3.9 3.9 3.8  CL 94* 94* 94*  CO2 28 28 28   GLUCOSE 103* 104* 97  BUN 19 18 17   CREATININE 0.70 0.65 0.70  CALCIUM 9.4 9.4 9.4   GFR: Estimated Creatinine Clearance: 110.3 mL/min (by C-G formula based on SCr of 0.7 mg/dL). Liver Function Tests: No results for input(s): AST, ALT, ALKPHOS, BILITOT, PROT, ALBUMIN in the last 168 hours. No results for input(s): LIPASE, AMYLASE in the last 168 hours. No results for input(s): AMMONIA in the last 168 hours. Coagulation Profile: No results for input(s): INR, PROTIME in the last 168 hours. Cardiac Enzymes: No results for input(s): CKTOTAL, CKMB, CKMBINDEX, TROPONINI in the last 168 hours. BNP (last 3 results) No results for input(s): PROBNP in the last 8760 hours. HbA1C: No results for input(s): HGBA1C in the last 72 hours. CBG: No results for input(s): GLUCAP in the last 168 hours. Lipid Profile: No results for input(s): CHOL, HDL, LDLCALC, TRIG, CHOLHDL, LDLDIRECT  in the last 72 hours. Thyroid Function Tests: No results for input(s): TSH, T4TOTAL, FREET4, T3FREE, THYROIDAB in the last 72 hours. Anemia Panel: No results for input(s): VITAMINB12, FOLATE, FERRITIN, TIBC, IRON, RETICCTPCT in the last 72 hours. Sepsis Labs: No results for input(s): PROCALCITON, LATICACIDVEN in the last 168 hours.  No results found for this or any previous visit (from the past 240 hour(s)).       Radiology Studies: No results found.      Scheduled Meds: . diclofenac sodium  2 g Topical QID  . enoxaparin (LOVENOX) injection  40 mg Subcutaneous Daily  . esomeprazole  20 mg Oral QAC breakfast  . feeding  supplement (ENSURE ENLIVE)  237 mL Oral TID BM  . ferrous sulfate  325 mg Oral BID WC  . folic acid  1 mg Oral Daily  . methocarbamol  500 mg Oral TID  . multivitamin with minerals  1 tablet Oral Daily  . polyethylene glycol  17 g Oral Daily  . senna-docusate  1 tablet Oral BID  . sodium chloride flush  10-40 mL Intracatheter Q12H  . thiamine  100 mg Oral Daily   Or  . thiamine  100 mg Intravenous Daily  . traMADol  50 mg Oral Q6H  . vitamin B-12  100 mcg Oral Daily   Continuous Infusions: . sodium chloride 10 mL/hr at 07/20/18 0513  . methocarbamol (ROBAXIN) IV    . vancomycin 750 mg (07/22/18 0229)     LOS: 32 days     Alwyn RenElizabeth G Joh Rao, MD [facility]  If 7PM-7AM, please contact night-coverage [email or web address] Password [email or web address] 07/22/2018, 8:45 AM

## 2018-07-23 NOTE — Progress Notes (Signed)
PROGRESS NOTE    Sutton Hirsch  QBH:[id number] DOB: 1965-02-20 DOA: 06/19/2018 PCP: Patient, No Pcp Per  Brief Narrative: 53 year old with past medical history significant for hepatitis C, who presented to the ED with complaints of increasing back pain, radiating down to the left leg initially but subsequently both legs. Patient was having difficulty standing and walking. Per patient the pain started after he attempted to lift a lawnmower over a week ago. Evaluation in the ED, MRI showed severe foraminal narrowing at level L5-S1.   He subsequently was admitted with sepsis secondary to right upper extremity cellulitis and was started on IV antibiotics, however cellulitis did not improve. He did havea CT scan which showed intra-facial abscess in the right shoulder along with septic arthritis of the [facility] joint. Patient had debridement of multiple abscess. During hospitalization patient began to have back pain and right thigh weakness. MRI of the lumbar spine which showed discitis as well as septic arthritis and paraspinal abscess. During hospitalization he did have left knee swelling, he had arthrocentesis which showed gram-positive cocci and had left knee washout. Patient was evaluated by ID who is recommending 6 to 8 weeks of IV antibiotics.  Vancomycin is started 06/23/2018---  Patient in the hospital to finish IV antibiotics  6/14 no new complaints waiting for pt to walk him tomorrow.had a bm. 6/13 labs stable  Assessment & Plan:   Principal Problem:   Septic arthritis of multiple joints (HCC) Active Problems:   Hepatitis C antibody test positive   Cigarette smoker   Intractable back pain   AKI (acute kidney injury) ([place])   Spinal stenosis of lumbosacral region   GERD (gastroesophageal reflux disease)   Mild protein-calorie malnutrition (HCC)   Septic arthritis of knee, left (HCC)   Acute medial meniscal tear, left, initial encounter   Polysubstance abuse (HCC)   Normocytic  anemia   Effusion of left elbow   Recurrent boils   Discitis of lumbosacral region   Psoas abscess, right (HCC)   Multiple abscesses of right shoulder   Chronic infection of left knee ([place])  -Sepsis secondary to cellulitis of the right upper extremity multiple abscess large multiple lumbar abscess and septic arthritis of the left knee: -Patient presented with tachycardia, leukocytosis and back pain. -MRI 5/5 showed severe lumbar spinal stenosis. MRI 5/19 showed L4 S1 discitis, L4-L5 septic arthritis multiple paraspinal abscess. -CT right shoulder showed intramuscular and infra facial abscess along with septic arthritis of the [facility] joint -Neurosurgery was consulted and Recommended repeat MRI near the end of antibiotics.  -ID consulted, recommend minimum of 6 weeks of IV antibiotics.  Antibiotics started 06/23/2018. -Orthopedic consulted. Patient is status post left knee arthrocentesis and washout. S/p irrigation and debridement of right shoulder, acromial medial clavicular joint irrigation and debridement on the right. -Echocardiogram was negative for vegetation. -PT consulted recommending SNF. -suture left knee removed. CPM machine ordered. Added low dose morphine prior patient using CPM machine.  Tolerating CPM machine.   AKI: -Creatinine on admission was 3.5. Normalized.  Normocytic anemia: hb stable. Iron deficiency anemia.  Start ferrous sulfate and B12 supplement.  Advised him to follow up with PCP for colonoscopy.  Hb remains a stable.  Hyponatremia: resolved with ivf   Constipation:Continue with bowel regimen  Polysubstance abuse -Admits to using cocaine prior to admission. Refuses he is ever used IV heroin. -UDS positive for benzodiazepines and cocaine.   Hypertension with tachycardia better after starting lopressor  DVT prophylaxis:Lovenox Code Status:Full code Family Communication:None Disposition Plan:Stay  in the hospital for IV antibiotics    Estimated body mass index is 23.68 kg/m as calculated from the following:   Height as of this encounter: 5\' 10"  (1.778 m).   Weight as of this encounter: 74.8 kg.   Subjective: No new c/o had a bm   Objective: Vitals:   07/22/18 1605 07/22/18 1919 07/22/18 2310 07/23/18 0313  BP: 124/72 130/74 128/74 129/76  Pulse: 98 96 98 90  Resp: 16 18 18 18   Temp: 98.4 F (36.9 C) 97.9 F (36.6 C) 97.8 F (36.6 C) 97.7 F (36.5 C)  TempSrc: Oral Oral Oral Oral  SpO2: 98% 98% 98% 98%  Weight:      Height:        Intake/Output Summary (Last 24 hours) at 07/23/2018 0759 Last data filed at 07/23/2018 0313 Gross per 24 hour  Intake -  Output 900 ml  Net -900 ml   Filed Weights   06/19/18 1303 06/23/18 1708 06/28/18 1543  Weight: 74.8 kg 74.8 kg 74.8 kg    Examination:  General exam: Appears calm and comfortable  Respiratory system: Clear to auscultation. Respiratory effort normal. Cardiovascular system: S1 & S2 heard, RRR. No JVD, murmurs, rubs, gallops or clicks. No pedal edema. Gastrointestinal system: Abdomen is nondistended, soft and nontender. No organomegaly or masses felt. Normal bowel sounds heard. Central nervous system: Alert and oriented. No focal neurological deficits. Extremities: Symmetric 5 x 5 power. Skin: No rashes, lesions or ulcers Psychiatry: Judgement and insight appear normal. Mood & affect appropriate.     Data Reviewed: I have personally reviewed following labs and imaging studies  CBC: Recent Labs  Lab 07/17/18 2000 07/19/18 0522 07/22/18 0033  WBC 14.6* 14.3* 13.5*  NEUTROABS  --  9.5* 9.0*  HGB 7.7* 7.7* 7.6*  HCT 24.1* 24.6* 24.2*  MCV 86.1 87.5 85.2  PLT 691* 675* 649*   Basic Metabolic Panel: Recent Labs  Lab 07/19/18 0500 07/22/18 0546  NA 134* 134*  K 3.9 3.8  CL 94* 94*  CO2 28 28  GLUCOSE 104* 97  BUN 18 17  CREATININE 0.65 0.70  CALCIUM 9.4 9.4   GFR: Estimated Creatinine Clearance: 110.3 mL/min (by C-G formula based  on SCr of 0.7 mg/dL). Liver Function Tests: No results for input(s): AST, ALT, ALKPHOS, BILITOT, PROT, ALBUMIN in the last 168 hours. No results for input(s): LIPASE, AMYLASE in the last 168 hours. No results for input(s): AMMONIA in the last 168 hours. Coagulation Profile: No results for input(s): INR, PROTIME in the last 168 hours. Cardiac Enzymes: No results for input(s): CKTOTAL, CKMB, CKMBINDEX, TROPONINI in the last 168 hours. BNP (last 3 results) No results for input(s): PROBNP in the last 8760 hours. HbA1C: No results for input(s): HGBA1C in the last 72 hours. CBG: No results for input(s): GLUCAP in the last 168 hours. Lipid Profile: No results for input(s): CHOL, HDL, LDLCALC, TRIG, CHOLHDL, LDLDIRECT in the last 72 hours. Thyroid Function Tests: No results for input(s): TSH, T4TOTAL, FREET4, T3FREE, THYROIDAB in the last 72 hours. Anemia Panel: No results for input(s): VITAMINB12, FOLATE, FERRITIN, TIBC, IRON, RETICCTPCT in the last 72 hours. Sepsis Labs: No results for input(s): PROCALCITON, LATICACIDVEN in the last 168 hours.  No results found for this or any previous visit (from the past 240 hour(s)).       Radiology Studies: No results found.      Scheduled Meds: . diclofenac sodium  2 g Topical QID  . enoxaparin (LOVENOX) injection  40 mg  Subcutaneous Daily  . esomeprazole  20 mg Oral QAC breakfast  . feeding supplement (ENSURE ENLIVE)  237 mL Oral TID BM  . ferrous sulfate  325 mg Oral BID WC  . folic acid  1 mg Oral Daily  . methocarbamol  500 mg Oral TID  . metoprolol tartrate  12.5 mg Oral Daily  . multivitamin with minerals  1 tablet Oral Daily  . polyethylene glycol  17 g Oral Daily  . senna-docusate  1 tablet Oral BID  . sodium chloride flush  10-40 mL Intracatheter Q12H  . thiamine  100 mg Oral Daily   Or  . thiamine  100 mg Intravenous Daily  . traMADol  50 mg Oral Q6H  . vitamin B-12  100 mcg Oral Daily   Continuous Infusions: . sodium  chloride 10 mL/hr at 07/22/18 2302  . methocarbamol (ROBAXIN) IV    . vancomycin 750 mg (07/23/18 0226)     LOS: 33 days     Alwyn RenElizabeth G Fahima Cifelli, MD [facility]  If 7PM-7AM, please contact night-coverage [email or web address] Password [email or web address] 07/23/2018, 7:59 AM

## 2018-07-23 NOTE — Progress Notes (Signed)
Orthopedic Tech Progress Note Patient Details:  [name] 08-16-1965 [id number]  CPM Left Knee CPM Left Knee: On Left Knee Flexion (Degrees): 50 Left Knee Extension (Degrees): 6 Additional Comments: patient can not tolerate flexion  Post Interventions Patient Tolerated: Well Instructions Provided: Care of device, Adjustment of device  [name] 07/23/2018, 5:23 PM

## 2018-07-24 MED ORDER — ZOLPIDEM TARTRATE 5 MG PO TABS
5.0000 mg | ORAL_TABLET | Freq: Every evening | ORAL | Status: DC | PRN
  Administered 2018-07-25 – 2018-07-26 (×2): 5 mg via ORAL
  Filled 2018-07-24 (×2): qty 1

## 2018-07-24 MED ORDER — METOPROLOL TARTRATE 12.5 MG HALF TABLET
12.5000 mg | ORAL_TABLET | Freq: Two times a day (BID) | ORAL | Status: DC
  Administered 2018-07-24 – 2018-08-10 (×34): 12.5 mg via ORAL
  Filled 2018-07-24 (×35): qty 1

## 2018-07-24 NOTE — Progress Notes (Signed)
PT Cancellation Note  Patient Details Name: [name] MRN: [id number] DOB: [birth year]   Cancelled Treatment:     Patient had got up to the commode earlier today and had a syncopal episode. He reports he twitched his knee on the way back. He reports his knee is hurting him. He declined therapy today.    Carney Living PT DPT  07/24/2018, 4:15 PM

## 2018-07-24 NOTE — Progress Notes (Addendum)
PROGRESS NOTE    Clifford MassedJames Cordrey  WUJ:[id number] DOB: 02/12/1965 DOA: 06/19/2018 PCP: Patient, No Pcp Per  Brief Narrative: 53 year old with past medical history significant for hepatitis C, who presented to the ED with complaints of increasing back pain, radiating down to the left leg initially but subsequently both legs. Patient was having difficulty standing and walking. Per patient the pain started after he attempted to lift a lawnmower over a week ago. Evaluation in the ED, MRI showed severe foraminal narrowing at level L5-S1.   He subsequently was admitted with sepsis secondary to right upper extremity cellulitis and was started on IV antibiotics, however cellulitis did not improve. He did havea CT scan which showed intra-facial abscess in the right shoulder along with [facility]. Patient had debridement of multiple abscess. During hospitalization patient began to have back pain and right thigh weakness. MRI of the lumbar spine which showed discitis as well as septic arthritis and paraspinal abscess. During hospitalization he did have left knee swelling, he had arthrocentesis which showed gram-positive cocci and had left knee washout. Patient was evaluated by ID who is recommending 6 to 8 weeks of IV antibiotics.  Vancomycin is started 06/23/2018---  Patient in the hospital to finish IV antibiotics   Assessment & Plan:   Principal Problem:   Septic arthritis of multiple joints (HCC) Active Problems:   Hepatitis C antibody test positive   Cigarette smoker   Intractable back pain   AKI (acute kidney injury) (HCC)   Spinal stenosis of lumbosacral region   GERD (gastroesophageal reflux disease)   Mild protein-calorie malnutrition (HCC)   Septic arthritis of knee, left (HCC)   Acute medial meniscal tear, left, initial encounter   Polysubstance abuse (HCC)   Normocytic anemia   Effusion of left elbow   Recurrent boils   Discitis of lumbosacral region  Psoas abscess, right (HCC)   Multiple abscesses of right shoulder   Chronic infection of left knee (HCC)   Sepsis secondary to cellulitis of the right upper extremity multiple abscess large multiple lumbar abscess and septic arthritis of the left knee: -Patient presented with tachycardia, leukocytosis and back pain. -MRI 5/5 showed severe lumbar spinal stenosis. MRI 5/19 showed L4 S1 discitis, L4-L5 septic arthritis multiple paraspinal abscess. -CT right shoulder showed intramuscular and infra facial abscess along with septic arthritis of the [facility] joint -Neurosurgery was consulted and Recommended repeat MRI near the end of antibiotics.  Antibiotics were started 06/23/2018.  He is on vancomycin.  This is for a duration of 6 weeks.  -ID consulted, recommend minimum of 6 weeks of IV antibiotics.Antibiotics started 06/23/2018. -Orthopedic consulted. Patient is status post left knee arthrocentesis and washout. S/p irrigation and debridement of right shoulder, acromial medial clavicular joint irrigation and debridement on the right. -Echocardiogram was negative for vegetation. -PT consulted recommending SNF. -suture left knee removed. CPM machine ordered. Added low dose morphine prior patient using CPM machine.  Tolerating CPM machine.   AKI: -Creatinine on admission was 3.5. Normalized.  Normocytic anemia: hb stable. Iron deficiency anemia.  Start ferrous sulfate and B12 supplement.  Advised him to follow up with PCP for colonoscopy.  Hb remains a stable.  Hyponatremia: resolved with ivf   Constipation:Continue with bowel regimen  Polysubstance abuse -Admits to using cocaine prior to admission. Refuses he is ever used IV heroin. -UDS positive for benzodiazepines and cocaine.   Hypertension with tachycardia better after starting lopressor  Addendum-noted PT note he had syncope?vasovagal.blood pressure high tachy ..Marland Kitchen  increase lopressor dose..  DVT prophylaxis:Lovenox  Code Status:Full code Family Communication:None Disposition Plan:Stay in the hospital for IV antibiotics  Estimated body mass index is 23.68 kg/m as calculated from the following:   Height as of this encounter: 5\' 10"  (1.778 m).   Weight as of this encounter: 74.8 kg.    Subjective:  Planes of not being able to sleep at night asking for something to sleep at night Objective: Vitals:   07/23/18 1919 07/23/18 2310 07/24/18 0311 07/24/18 0804  BP: 133/85 (!) 154/87 137/83 (!) 144/84  Pulse: 99 (!) 115 99 (!) 102  Resp: 18 18 18 16   Temp: 98.8 F (37.1 C) 98.4 F (36.9 C) 98.6 F (37 C) 98.2 F (36.8 C)  TempSrc: Oral Oral Oral Oral  SpO2: 99% 97% 98% 97%  Weight:      Height:        Intake/Output Summary (Last 24 hours) at 07/24/2018 1015 Last data filed at 07/24/2018 0311 Gross per 24 hour  Intake -  Output 1650 ml  Net -1650 ml   Filed Weights   06/19/18 1303 06/23/18 1708 06/28/18 1543  Weight: 74.8 kg 74.8 kg 74.8 kg    Examination:  General exam: Appears calm and comfortable  Respiratory system: Clear to auscultation. Respiratory effort normal. Cardiovascular system: S1 & S2 heard, RRR. No JVD, murmurs, rubs, gallops or clicks. No pedal edema. Gastrointestinal system: Abdomen is nondistended, soft and nontender. No organomegaly or masses felt. Normal bowel sounds heard. Central nervous system: Alert and oriented. No focal neurological deficits. Extremities: No edema right shoulder scar and left knee scar clean and dry Skin: No rashes, lesions or ulcers Psychiatry: Judgement and insight appear normal. Mood & affect appropriate.     Data Reviewed: I have personally reviewed following labs and imaging studies  CBC: Recent Labs  Lab 07/17/18 2000 07/19/18 0522 07/22/18 0033  WBC 14.6* 14.3* 13.5*  NEUTROABS  --  9.5* 9.0*  HGB 7.7* 7.7* 7.6*  HCT 24.1* 24.6* 24.2*  MCV 86.1 87.5 85.2  PLT 691* 675* 649*   Basic Metabolic Panel: Recent Labs  Lab  07/19/18 0500 07/22/18 0546  NA 134* 134*  K 3.9 3.8  CL 94* 94*  CO2 28 28  GLUCOSE 104* 97  BUN 18 17  CREATININE 0.65 0.70  CALCIUM 9.4 9.4   GFR: Estimated Creatinine Clearance: 110.3 mL/min (by C-G formula based on SCr of 0.7 mg/dL). Liver Function Tests: No results for input(s): AST, ALT, ALKPHOS, BILITOT, PROT, ALBUMIN in the last 168 hours. No results for input(s): LIPASE, AMYLASE in the last 168 hours. No results for input(s): AMMONIA in the last 168 hours. Coagulation Profile: No results for input(s): INR, PROTIME in the last 168 hours. Cardiac Enzymes: No results for input(s): CKTOTAL, CKMB, CKMBINDEX, TROPONINI in the last 168 hours. BNP (last 3 results) No results for input(s): PROBNP in the last 8760 hours. HbA1C: No results for input(s): HGBA1C in the last 72 hours. CBG: No results for input(s): GLUCAP in the last 168 hours. Lipid Profile: No results for input(s): CHOL, HDL, LDLCALC, TRIG, CHOLHDL, LDLDIRECT in the last 72 hours. Thyroid Function Tests: No results for input(s): TSH, T4TOTAL, FREET4, T3FREE, THYROIDAB in the last 72 hours. Anemia Panel: No results for input(s): VITAMINB12, FOLATE, FERRITIN, TIBC, IRON, RETICCTPCT in the last 72 hours. Sepsis Labs: No results for input(s): PROCALCITON, LATICACIDVEN in the last 168 hours.  No results found for this or any previous visit (from the past 240 hour(s)).  Radiology Studies: No results found.      Scheduled Meds: . diclofenac sodium  2 g Topical QID  . enoxaparin (LOVENOX) injection  40 mg Subcutaneous Daily  . esomeprazole  20 mg Oral QAC breakfast  . feeding supplement (ENSURE ENLIVE)  237 mL Oral TID BM  . ferrous sulfate  325 mg Oral BID WC  . folic acid  1 mg Oral Daily  . methocarbamol  500 mg Oral TID  . metoprolol tartrate  12.5 mg Oral Daily  . multivitamin with minerals  1 tablet Oral Daily  . polyethylene glycol  17 g Oral Daily  . senna-docusate  1 tablet Oral BID  .  sodium chloride flush  10-40 mL Intracatheter Q12H  . thiamine  100 mg Oral Daily   Or  . thiamine  100 mg Intravenous Daily  . traMADol  50 mg Oral Q6H  . vitamin B-12  100 mcg Oral Daily   Continuous Infusions: . sodium chloride 10 mL/hr at 07/22/18 2302  . methocarbamol (ROBAXIN) IV    . vancomycin 750 mg (07/24/18 0215)     LOS: 34 days     Georgette Shell, MD [facility]  If 7PM-7AM, please contact night-coverage [email or web address] Password [facility] 07/24/2018, 10:15 AM

## 2018-07-24 NOTE — TOC Progression Note (Signed)
Transition of Care [facility]) - Progression Note    Patient Details  Name: Jabori Henegar MRN: [id number] Date of Birth: 21-Feb-1965  Transition of Care [facility]) CM/SW Contact  Pollie Friar, RN Phone Number: 07/24/2018, 2:47 PM  Clinical Narrative:    Pt continues to receive IV vanc that started on 5/15 for 6 weeks. Pt continues to work with therapy. Pt is without insurance and was  Positive for cocaine on admission. TOC has attempted to reach daughter: [name] x 2 today to go over d/c plan when he is ready for d/c.  TOC following.  Expected Discharge Plan: [place] Barriers to Discharge: Inadequate or no insurance  Expected Discharge Plan and Services Expected Discharge Plan: [place] arrangements for the past 2 months: Single Family Home(one level)                                       Social Determinants of Health (SDOH) Interventions    Readmission Risk Interventions Readmission Risk Prevention Plan 06/28/2018  Transportation Screening Complete  HRI or [place] Complete  Social Work Consult for [place] Planning/Counseling Complete  Palliative Care Screening Not Applicable  Medication Review Press photographer) Referral to Pharmacy  Some recent data might be hidden

## 2018-07-25 DIAGNOSIS — L0293 Carbuncle, unspecified: Secondary | ICD-10-CM

## 2018-07-25 LAB — COMPREHENSIVE METABOLIC PANEL
ALT: 21 U/L (ref 0–44)
AST: 15 U/L (ref 15–41)
Albumin: 1.9 g/dL — ABNORMAL LOW (ref 3.5–5.0)
Alkaline Phosphatase: 112 U/L (ref 38–126)
Anion gap: 10 (ref 5–15)
BUN: 18 mg/dL (ref 6–20)
CO2: 29 mmol/L (ref 22–32)
Calcium: 9.1 mg/dL (ref 8.9–10.3)
Chloride: 95 mmol/L — ABNORMAL LOW (ref 98–111)
Creatinine, Ser: 0.7 mg/dL (ref 0.61–1.24)
GFR calc Af Amer: >60 mL/min (ref >60)
GFR calc non Af Amer: >60 mL/min (ref >60)
Glucose, Bld: 94 mg/dL (ref 70–99)
Potassium: 3.9 mmol/L (ref 3.5–5.1)
Sodium: 134 mmol/L — ABNORMAL LOW (ref 135–145)
Total Bilirubin: 0.2 mg/dL — ABNORMAL LOW (ref 0.3–1.2)
Total Protein: 6.9 g/dL (ref 6.5–8.1)

## 2018-07-25 LAB — CBC WITH DIFFERENTIAL/PLATELET
Abs Immature Granulocytes: 0.2 10*3/uL — ABNORMAL HIGH (ref 0.00–0.07)
Basophils Absolute: 0.1 10*3/uL (ref 0.0–0.1)
Basophils Relative: 1 %
Eosinophils Absolute: 0.2 10*3/uL (ref 0.0–0.5)
Eosinophils Relative: 1 %
HCT: 24.1 % — ABNORMAL LOW (ref 39.0–52.0)
Hemoglobin: 7.5 g/dL — ABNORMAL LOW (ref 13.0–17.0)
Immature Granulocytes: 2 %
Lymphocytes Relative: 25 %
Lymphs Abs: 3.3 10*3/uL (ref 0.7–4.0)
MCH: 26.6 pg (ref 26.0–34.0)
MCHC: 31.1 g/dL (ref 30.0–36.0)
MCV: 85.5 fL (ref 80.0–100.0)
Monocytes Absolute: 1 10*3/uL (ref 0.1–1.0)
Monocytes Relative: 7 %
Neutro Abs: 8.4 10*3/uL — ABNORMAL HIGH (ref 1.7–7.7)
Neutrophils Relative %: 64 %
Platelets: 644 10*3/uL — ABNORMAL HIGH (ref 150–400)
RBC: 2.82 MIL/uL — ABNORMAL LOW (ref 4.22–5.81)
RDW: 17.6 % — ABNORMAL HIGH (ref 11.5–15.5)
WBC: 13.1 10*3/uL — ABNORMAL HIGH (ref 4.0–10.5)
nRBC: 0 % (ref 0.0–0.2)

## 2018-07-25 LAB — MRSA PCR SCREENING: MRSA by PCR: NEGATIVE

## 2018-07-25 NOTE — Progress Notes (Signed)
Orthopedic Tech Progress Note Patient Details:  Cordera Stineman 1965/07/12 [id number] Put a towel on his leg and added a ice pack CPM Left Knee CPM Left Knee: On Left Knee Flexion (Degrees): 55 Left Knee Extension (Degrees): 6 Additional Comments: patient can not tolerate flexion  Post Interventions Patient Tolerated: Well Instructions Provided: Adjustment of device, Care of device  [name] 07/25/2018, 5:50 PM

## 2018-07-25 NOTE — Progress Notes (Addendum)
Occupational Therapy Treatment Patient Details Name: [name] MRN: [id number] DOB: 17-Aug-1965 Today's Date: 07/25/2018    History of present illness Pt. with PMH of hep C, substance abuse; admitted on 06/19/2018, presented with complaint of back pain, was found to have musculoskeletal back pain as well as right upper extremity cellulitis; Patient had a debridement of his shoulder perfromed on 5/20.    OT comments  Pt presents supine in bed agreeable to working with therapies. Pt continues to have limitations due to pain and requires increased physical assist for functional transfers given current pain levels. Orthostatics obtained this session (see vitals for full orthostatics). Most notable BP was after transition to sitting EOB; BP supine initially was 110/90, after transition to sitting EOB BP dropping to 87/76. BP increasing after maintaining sitting for approx 2 min and without significant decrease during standing BPs. Pt does require two person assist for safe sit<>stand and to maintain static standing. Pt appreciative of therapies working with him today. Feel POC remains appropriate at this time. Will continue to follow acutely.    Follow Up Recommendations  SNF;Supervision/Assistance - 24 hour    Equipment Recommendations  Other (comment)(TBD in next venue)          Precautions / Restrictions Precautions Precautions: Shoulder Type of Shoulder Precautions: Active protocol: PROM/AROM shoulder FF to 90, abduct to 60, ER to 30; verbal order per Dr. Marlou Sa on 5/25 AROM elbow/wrist/hand as tolerated  Shoulder Interventions: Shoulder sling/immobilizer;For comfort Required Braces or Orthoses: Sling Restrictions Weight Bearing Restrictions: Yes RUE Weight Bearing: Non weight bearing LLE Weight Bearing: Weight bearing as tolerated Other Position/Activity Restrictions: L LE WBAT       Mobility Bed Mobility Overal bed mobility: Needs Assistance Bed Mobility: Supine to Sit;Sit to  Supine     Supine to sit: Min assist;+2 for physical assistance Sit to supine: Mod assist;+2 for safety/equipment   General bed mobility comments: Advanced LE's initially but required assist to complete. Pt reaching for therapist's hand to pull to full sitting position.   Transfers Overall transfer level: Needs assistance Equipment used: Rolling walker (2 wheeled) Transfers: Sit to/from Stand Sit to Stand: Mod assist;+2 physical assistance;+2 safety/equipment   Squat pivot transfers: Mod assist;+2 safety/equipment;+2 physical assistance     General transfer comment: stood with RW ~4 minutes to take 2 sets of standing BP's. requires cues not to bear weight through RUE with transitions    Balance Overall balance assessment: Needs assistance Sitting-balance support: No upper extremity supported;Feet supported Sitting balance-Leahy Scale: Fair Sitting balance - Comments: needing supervision for static balance, holds onto EOB for support Postural control: Left lateral lean Standing balance support: Bilateral upper extremity supported;During functional activity Standing balance-Leahy Scale: Poor Standing balance comment: heavily reliant on external support                           ADL either performed or assessed with clinical judgement   ADL Overall ADL's : Needs assistance/impaired     Grooming: Set up;Sitting;Wash/dry face                               Functional mobility during ADLs: Minimal assistance;Moderate assistance;+2 for physical assistance;+2 for safety/equipment;Rolling walker General ADL Comments: pt continues to have limitations due to pain; agreeable to mobilizing with therapies to take orthostatics this session  Cognition Arousal/Alertness: Awake/alert Behavior During Therapy: WFL for tasks assessed/performed Overall Cognitive Status: Within Functional Limits for tasks assessed                                  General Comments: Somewhat self-limiting        Exercises     Shoulder Instructions       General Comments see vitals for BP (orthostatics)    Pertinent Vitals/ Pain       Pain Assessment: Faces Faces Pain Scale: Hurts little more Pain Location: L knee/ low back Pain Descriptors / Indicators: Sore;Grimacing;Guarding;Moaning Pain Intervention(s): Monitored during session  Home Living                                          Prior Functioning/Environment              Frequency  Min 3X/week        Progress Toward Goals  OT Goals(current goals can now be found in the care plan section)  Progress towards OT goals: Progressing toward goals  Acute Rehab OT Goals Patient Stated Goal: to progress mobility OT Goal Formulation: With patient Time For Goal Achievement: 07/31/18 Potential to Achieve Goals: Good ADL Goals Pt Will Perform Grooming: with modified independence;sitting Pt Will Perform Upper Body Dressing: with modified independence;sitting Pt Will Perform Lower Body Dressing: with modified independence;sitting/lateral leans;sit to/from stand Pt Will Transfer to Toilet: with min guard assist;ambulating;bedside commode Pt/caregiver will Perform Home Exercise Program: Both right and left upper extremity;With Supervision;With written HEP provided Additional ADL Goal #1: Pt will sit EOB for ADL tasks with set-upA and fair sitting balance  Plan Discharge plan remains appropriate    Co-evaluation    PT/OT/SLP Co-Evaluation/Treatment: Yes Reason for Co-Treatment: Complexity of the patient's impairments (multi-system involvement);Necessary to address cognition/behavior during functional activity;To address functional/ADL transfers PT goals addressed during session: Mobility/safety with mobility;Balance;Proper use of DME OT goals addressed during session: ADL's and self-care;Strengthening/ROM      AM-PAC OT "6 Clicks" Daily  Activity     Outcome Measure   Help from another person eating meals?: None Help from another person taking care of personal grooming?: A Little Help from another person toileting, which includes using toliet, bedpan, or urinal?: A Lot Help from another person bathing (including washing, rinsing, drying)?: A Lot Help from another person to put on and taking off regular upper body clothing?: A Little Help from another person to put on and taking off regular lower body clothing?: Total 6 Click Score: 15    End of Session Equipment Utilized During Treatment: Rolling walker;Gait belt CPM Left Knee CPM Left Knee: Off  OT Visit Diagnosis: Muscle weakness (generalized) (M62.81);Pain Pain - Right/Left: Right Pain - part of body: Hip;Leg;Shoulder   Activity Tolerance Patient tolerated treatment well   Patient Left in bed;with call bell/phone within reach;with bed alarm set   Nurse Communication Mobility status        Time: [id number] OT Time Calculation (min): 34 min  Charges: OT General Charges $OT Visit: 1 Visit OT Treatments $Self Care/Home Management : 8-22 mins  Marcy SirenBreanna Sohana Austell, OT Supplemental Rehabilitation Services Pager [phone] Office [phone]    Orlando PennerBreanna L [place]hton Early 07/25/2018, 2:11 PM

## 2018-07-25 NOTE — Progress Notes (Signed)
Pt has order for orthostatic Vital signs, attempted twice but pt refused saying he cannot move his legs so he cannot get up even though we offered to assist him in getting up, will pass on to the on coming RN. Obasog[place]-Asidi, Rosemary Mossbarger Efe

## 2018-07-25 NOTE — TOC Progression Note (Signed)
Transition of Care [facility]) - Progression Note    Patient Details  Name: [name] MRN: [id number] Date of Birth: 20-Jun-1965  Transition of Care [facility]) CM/SW Contact  Pollie Friar, RN Phone Number: 07/25/2018, 2:29 PM  Clinical Narrative:    CM spoke to daughter: [name] and pt late yesterday. They both agree that they would like for the d/c plan to be home with [facility] but pt is only ambulating a couple feet.  Both agreeable to patient being faxed out, understanding that pt has no insurance and has a cocaine history, these will greatly limit offers from SNFs.  Pt is having some drop in BP and feeling faint last few days. Will follow along. Hope that pt is able to increase activity tolerance prior to d/c so he can d/c home.  FL2 completed and pt faxed to facilities that will take a LOG from the hospital.  TOC is following.   Expected Discharge Plan: [place] Barriers to Discharge: Inadequate or no insurance  Expected Discharge Plan and Services Expected Discharge Plan: [place] arrangements for the past 2 months: Single Family Home(one level)                                       Social Determinants of Health (SDOH) Interventions    Readmission Risk Interventions Readmission Risk Prevention Plan 06/28/2018  Transportation Screening Complete  HRI or [place] Complete  Social Work Consult for [place] Planning/Counseling Complete  Palliative Care Screening Not Applicable  Medication Review Press photographer) Referral to Pharmacy  Some recent data might be hidden

## 2018-07-25 NOTE — Progress Notes (Signed)
Physical Therapy Treatment Patient Details Name: [name] MRN: [id number] DOB: 1965/11/01 Today's Date: 07/25/2018    History of Present Illness Pt. with PMH of hep C, substance abuse; admitted on 06/19/2018, presented with complaint of back pain, was found to have musculoskeletal back pain as well as right upper extremity cellulitis; Patient had a debridement of his shoulder perfromed on 5/20.     PT Comments    Pt reports increased pain in back/hips after reported syncopal episode yesterday. Pt was only able to tolerate standing EOB and we were not able to progress to gait training this session. Pt reports sitting in chair and on BSC causes him to pass out. Focus of session was completion of orthostatic vitals. We did see a drop in BP to 87/76 in sitting, however recovered into 716'R systolically within 2 minutes of sitting. See vital signs flowsheet for full orthostatic vitals details. Pt appreciative of therapists today and was tearful throughout session. Will continue to follow and progress as able per POC.    Follow Up Recommendations  SNF     Equipment Recommendations  Other (comment)    Recommendations for Other Services Rehab consult     Precautions / Restrictions Precautions Precautions: Shoulder Type of Shoulder Precautions: Active protocol: PROM/AROM shoulder FF to 90, abduct to 60, ER to 30; verbal order per Dr. Marlou Sa on 5/25 AROM elbow/wrist/hand as tolerated  Shoulder Interventions: Shoulder sling/immobilizer;For comfort Required Braces or Orthoses: Sling Restrictions Weight Bearing Restrictions: Yes RUE Weight Bearing: Non weight bearing LLE Weight Bearing: Weight bearing as tolerated Other Position/Activity Restrictions: L LE WBAT    Mobility  Bed Mobility Overal bed mobility: Needs Assistance Bed Mobility: Supine to Sit;Sit to Supine     Supine to sit: Min assist;+2 for physical assistance Sit to supine: Mod assist;+2 for safety/equipment   General bed  mobility comments: Advanced LE's initially but required assist to complete. Pt reaching for therapist's hand to pull to full sitting position.   Transfers Overall transfer level: Needs assistance Equipment used: Rolling walker (2 wheeled) Transfers: Sit to/from Stand Sit to Stand: Mod assist;+2 physical assistance;+2 safety/equipment   Squat pivot transfers: Mod assist;+2 safety/equipment;+2 physical assistance     General transfer comment: stood with RW ~4 minutes to take 2 sets of standing BP's.   Ambulation/Gait             General Gait Details: Unable to progress to gait training this session   Stairs             Wheelchair Mobility    Modified Rankin (Stroke Patients Only)       Balance Overall balance assessment: Needs assistance Sitting-balance support: No upper extremity supported;Feet supported Sitting balance-Leahy Scale: Fair Sitting balance - Comments: needing supervision for static balance, holds onto EOB for support Postural control: Left lateral lean Standing balance support: Bilateral upper extremity supported;During functional activity Standing balance-Leahy Scale: Poor Standing balance comment: heavily reliant on external support                            Cognition Arousal/Alertness: Awake/alert Behavior During Therapy: WFL for tasks assessed/performed Overall Cognitive Status: Within Functional Limits for tasks assessed                                 General Comments: Somewhat self-limiting      Exercises      General  Comments        Pertinent Vitals/Pain Pain Assessment: Faces Faces Pain Scale: Hurts little more Pain Location: L knee/ low back Pain Descriptors / Indicators: Sore;Grimacing;Guarding;Moaning Pain Intervention(s): Monitored during session    Home Living                      Prior Function            PT Goals (current goals can now be found in the care plan section) Acute  Rehab PT Goals Patient Stated Goal: to progress mobility PT Goal Formulation: With patient Time For Goal Achievement: 07/05/18 Potential to Achieve Goals: Good Progress towards PT goals: Progressing toward goals    Frequency    Min 3X/week      PT Plan Current plan remains appropriate    Co-evaluation PT/OT/SLP Co-Evaluation/Treatment: Yes Reason for Co-Treatment: Complexity of the patient's impairments (multi-system involvement);For patient/therapist safety;To address functional/ADL transfers PT goals addressed during session: Mobility/safety with mobility;Balance;Proper use of DME        AM-PAC PT "6 Clicks" Mobility   Outcome Measure  Help needed turning from your back to your side while in a flat bed without using bedrails?: A Lot Help needed moving from lying on your back to sitting on the side of a flat bed without using bedrails?: A Lot Help needed moving to and from a bed to a chair (including a wheelchair)?: A Lot Help needed standing up from a chair using your arms (e.g., wheelchair or bedside chair)?: A Lot Help needed to walk in hospital room?: Total Help needed climbing 3-5 steps with a railing? : Total 6 Click Score: 10    End of Session Equipment Utilized During Treatment: Gait belt Activity Tolerance: Patient limited by pain Patient left: in bed;with call bell/phone within reach;with bed alarm set Nurse Communication: Mobility status PT Visit Diagnosis: Unsteadiness on feet (R26.81);Muscle weakness (generalized) (M62.81);Pain;Other abnormalities of gait and mobility (R26.89) Pain - Right/Left: Right Pain - part of body: Arm(back)     Time: [id number] PT Time Calculation (min) (ACUTE ONLY): 34 min  Charges:  $Gait Training: 8-22 mins                     Conni SlipperLaura Jorey Dollard, PT, DPT Acute Rehabilitation Services Pager: [phone] Office: [phone]    Marylynn PearsonLaura D Annalicia Renfrew 07/25/2018, 12:39 PM

## 2018-07-25 NOTE — Progress Notes (Signed)
Case discussed in LOS meeting; CM/ SW will continue to progress the patient for discharge; B Rosamae Rocque RN,MHA,BSN Advance Care Supervisor [phone] 

## 2018-07-25 NOTE — NC FL2 (Signed)
[place] LEVEL OF CARE SCREENING TOOL     IDENTIFICATION  Patient Name: [name] Birthdate: 06/11/1965 Sex: male Admission Date (Current Location): 06/19/2018  [facility] and [place] Number:  [employer] and Address:  [facility], [place] [address] [place], [place] [address]      Provider Number: [id number]  Attending Physician Name and Address:  Karie Kirks, DO  Relative Name and Phone Number:       Current Level of Care: Hospital Recommended Level of Care: [place] Prior Approval Number:    Date Approved/Denied:   PASRR Number: [id number]  Discharge Plan: SNF    Current Diagnoses: Patient Active Problem List   Diagnosis Date Noted  . Discitis of lumbosacral region 06/28/2018  . Psoas abscess, right ([place]) 06/28/2018  . Multiple abscesses of right shoulder 06/28/2018  . Septic arthritis of multiple joints ([place]) 06/28/2018  . Chronic infection of left knee ([place]) 06/28/2018  . Polysubstance abuse ([place]) 06/24/2018  . Normocytic anemia 06/24/2018  . Effusion of left elbow 06/24/2018  . Recurrent boils 06/24/2018  . Head injury   . Septic arthritis of knee, left ([place])   . Acute medial meniscal tear, left, initial encounter   . GERD (gastroesophageal reflux disease) 06/22/2018  . Mild protein-calorie malnutrition ([place]) 06/22/2018  . AKI (acute kidney injury) ([place]) 06/21/2018  . Spinal stenosis of lumbosacral region 06/21/2018  . Intractable back pain 06/19/2018  . Hepatitis C antibody test positive 12/17/2013  . Cigarette smoker 12/17/2013  . Family history of diabetes mellitus (DM) 12/17/2013  . Notalgia 12/17/2013    Orientation RESPIRATION BLADDER Height & Weight     Self, Time, Situation, Place  Normal Continent Weight: 74.8 kg Height:  5\' 10"  (177.8 cm)  BEHAVIORAL SYMPTOMS/MOOD NEUROLOGICAL BOWEL NUTRITION STATUS      Continent Diet(Regular with thin liquids)  AMBULATORY STATUS  COMMUNICATION OF NEEDS Skin   Extensive Assist Verbally Surgical wounds(dressing to lt knee from surgery/ dressing to rt shoulder from surgery)                       Personal Care Assistance Level of Assistance  Feeding, Bathing, Dressing Bathing Assistance: Limited assistance Feeding assistance: Independent Dressing Assistance: Maximum assistance     Functional Limitations Info  Sight, Hearing, Speech Sight Info: Adequate Hearing Info: Adequate Speech Info: Adequate    SPECIAL CARE FACTORS FREQUENCY  PT (By licensed PT), OT (By licensed OT)     PT Frequency: 5x/wk OT Frequency: 5x/wk            Contractures Contractures Info: Not present    Additional Factors Info  Code Status, Allergies, Psychotropic Code Status Info: Full Allergies Info: NKA Psychotropic Info: Robaxin 500 mg Three times a day/ Ultram 50 mg every 6 hours/ Percocet 5-325 mg very 6 hours as needed         Current Medications (07/25/2018):  This is the current hospital active medication list Current Facility-Administered Medications  Medication Dose Route Frequency Provider Last Rate Last Dose  . 0.9 %  sodium chloride infusion   Intravenous Continuous Dahal, Marlowe Aschoff, MD 10 mL/hr at 07/22/18 2302    . acetaminophen (TYLENOL) tablet 650 mg  650 mg Oral Q6H PRN [name], MD   650 mg at 07/05/18 2142   Or  . acetaminophen (TYLENOL) suppository 650 mg  650 mg Rectal Q6H PRN [name], MD      . alum &  mag hydroxide-simeth (MAALOX/MYLANTA) 200-200-20 MG/5ML suspension 30 mL  30 mL Oral Q4H PRN Cammy Copaean, Gregory Scott, MD   30 mL at 06/21/18 0910  . bisacodyl (DULCOLAX) suppository 10 mg  10 mg Rectal Daily PRN Edsel PetrinMikhail, Maryann, DO      . diclofenac sodium (VOLTAREN) 1 % transdermal gel 2 g  2 g Topical QID Regalado, Belkys A, MD   2 g at 07/24/18 0843  . enoxaparin (LOVENOX) injection 40 mg  40 mg Subcutaneous Daily Regalado, Belkys A, MD   40 mg at 07/25/18 1027  . esomeprazole  (NEXIUM) capsule 20 mg  20 mg Oral QAC breakfast Cammy Copaean, Gregory Scott, MD   20 mg at 07/25/18 0748  . feeding supplement (ENSURE ENLIVE) (ENSURE ENLIVE) liquid 237 mL  237 mL Oral TID BM Regalado, Belkys A, MD   237 mL at 07/25/18 1346  . ferrous sulfate tablet 325 mg  325 mg Oral BID WC Regalado, Belkys A, MD   325 mg at 07/25/18 0747  . folic acid (FOLVITE) tablet 1 mg  1 mg Oral Daily Cammy Copaean, Gregory Scott, MD   1 mg at 07/25/18 1027  . menthol-cetylpyridinium (CEPACOL) lozenge 3 mg  1 lozenge Oral PRN Cammy Copaean, Gregory Scott, MD       Or  . phenol (CHLORASEPTIC) mouth spray 1 spray  1 spray Mouth/Throat PRN Cammy Copaean, Gregory Scott, MD      . methocarbamol (ROBAXIN) tablet 500 mg  500 mg Oral Q6H PRN Cammy Copaean, Gregory Scott, MD   500 mg at 07/24/18 1320   Or  . methocarbamol (ROBAXIN) 500 mg in dextrose 5 % 50 mL IVPB  500 mg Intravenous Q6H PRN Cammy Copaean, Gregory Scott, MD      . methocarbamol (ROBAXIN) tablet 500 mg  500 mg Oral TID Cammy Copaean, Gregory Scott, MD   500 mg at 07/25/18 1027  . metoCLOPramide (REGLAN) tablet 5-10 mg  5-10 mg Oral Q8H PRN Cammy Copaean, Gregory Scott, MD       Or  . metoCLOPramide (REGLAN) injection 5-10 mg  5-10 mg Intravenous Q8H PRN Cammy Copaean, Gregory Scott, MD      . metoprolol tartrate (LOPRESSOR) tablet 12.5 mg  12.5 mg Oral BID Alwyn RenMathews, Elizabeth G, MD   12.5 mg at 07/25/18 1028  . morphine 2 MG/ML injection 2 mg  2 mg Intravenous Q4H PRN Regalado, Belkys A, MD   2 mg at 07/25/18 1140  . multivitamin with minerals tablet 1 tablet  1 tablet Oral Daily Cammy Copaean, Gregory Scott, MD   1 tablet at 07/25/18 1027  . ondansetron (ZOFRAN) tablet 4 mg  4 mg Oral Q6H PRN Cammy Copaean, Gregory Scott, MD   4 mg at 07/12/18 1333   Or  . ondansetron (ZOFRAN) injection 4 mg  4 mg Intravenous Q6H PRN Cammy Copaean, Gregory Scott, MD   4 mg at 07/24/18 1845  . oxyCODONE-acetaminophen (PERCOCET/ROXICET) 5-325 MG per tablet 1 tablet  1 tablet Oral Q6H PRN Lorin Glassahal, Binaya, MD   1 tablet at 07/25/18 1345  . polyethylene glycol (MIRALAX /  GLYCOLAX) packet 17 g  17 g Oral Daily Dahal, Melina SchoolsBinaya, MD   17 g at 07/09/18 0906  . polyvinyl alcohol (LIQUIFILM TEARS) 1.4 % ophthalmic solution 1 drop  1 drop Both Eyes PRN Edsel PetrinMikhail, Maryann, DO   1 drop at 07/09/18 2307  . senna-docusate (Senokot-S) tablet 1 tablet  1 tablet Oral BID Lorin Glassahal, Binaya, MD   1 tablet at 07/25/18 1028  . sodium chloride flush (NS) 0.9 % injection 10-40 mL  10-40 mL Intracatheter Q12H Cammy Copaean, Gregory Scott, MD   20 mL at 07/25/18 1030  . sodium chloride flush (NS) 0.9 % injection 10-40 mL  10-40 mL Intracatheter PRN Cammy Copaean, Gregory Scott, MD      . thiamine (VITAMIN B-1) tablet 100 mg  100 mg Oral Daily Cammy Copaean, Gregory Scott, MD   100 mg at 07/25/18 1027   Or  . thiamine (B-1) injection 100 mg  100 mg Intravenous Daily Cammy Copaean, Gregory Scott, MD   100 mg at 06/28/18 0934  . traMADol (ULTRAM) tablet 50 mg  50 mg Oral Q6H Cammy Copaean, Gregory Scott, MD   50 mg at 07/25/18 [phone]  . vancomycin (VANCOCIN) IVPB 750 mg/150 ml premix  750 mg Intravenous Q12H Regalado, Belkys A, MD 150 mL/hr at 07/25/18 0146 750 mg at 07/25/18 0146  . vitamin B-12 (CYANOCOBALAMIN) tablet 100 mcg  100 mcg Oral Daily Regalado, Belkys A, MD   100 mcg at 07/25/18 1028  . zolpidem (AMBIEN) tablet 5 mg  5 mg Oral QHS PRN Alwyn RenMathews, Elizabeth G, MD   5 mg at 07/25/18 0012     Discharge Medications: Please see discharge summary for a list of discharge medications.  Relevant Imaging Results:  Relevant Lab Results:   Additional Information SS#: [id number] // Pt will be a LOG from the hospital--pt is a medicaid potential  Kermit BaloKelli F Lindora Alviar, RN

## 2018-07-25 NOTE — Progress Notes (Signed)
PROGRESS NOTE  Sukhraj Esquivias DXA:[id number] DOB: 1965-09-18 DOA: 06/19/2018 PCP: Patient, No Pcp Per  Brief History   53 year old with past medical history significant for hepatitis C, who presented to the ED with complaints of increasing back pain, radiating down to the left leg initially but subsequently both legs. Patient was having difficulty standing and walking. Per patient the pain started after he attempted to lift a lawnmower over a week ago. Evaluation in the ED, MRI showed severe foraminal narrowing at level L5-S1.   He subsequently was admitted with sepsis secondary to right upper extremity cellulitis and was started on IV antibiotics, however cellulitis did not improve. He did havea CT scan which showed intra-facial abscess in the right shoulder along with septic arthritis of the [facility] joint. Patient had debridement of multiple abscess. During hospitalization patient began to have back pain and right thigh weakness. MRI of the lumbar spine which showed discitis as well as septic arthritis and paraspinal abscess. During hospitalization he did have left knee swelling, he had arthrocentesis which showed gram-positive cocci and had left knee washout. Patient was evaluated by ID who is recommending 6 to 8 weeks of IV antibiotics.  Vancomycin is started 06/23/2018---  Patient in the hospital to finish IV antibiotics  Consultants  . Infectious disease . Orthopedic surgery . Neurosurgery  Procedures  . Arthrocentesis  Antibiotics   Anti-infectives (From admission, onward)   Start     Dose/Rate Route Frequency Ordered Stop   06/29/18 1400  vancomycin (VANCOCIN) IVPB 750 mg/150 ml premix     750 mg 150 mL/hr over 60 Minutes Intravenous Every 12 hours 06/29/18 1342     06/29/18 0000  vancomycin IVPB     750 mg Intravenous Every 12 hours 06/29/18 1407 08/09/18 2359   06/28/18 1735  gentamicin (GARAMYCIN) injection  Status:  Discontinued       As needed 06/28/18 1736 06/28/18  1838   06/28/18 1734  vancomycin (VANCOCIN) powder  Status:  Discontinued       As needed 06/28/18 1735 06/28/18 1838   06/26/18 1300  vancomycin (VANCOCIN) 1,250 mg in sodium chloride 0.9 % 250 mL IVPB  Status:  Discontinued     1,250 mg 166.7 mL/hr over 90 Minutes Intravenous Every 12 hours 06/26/18 1057 06/26/18 1136   06/26/18 1300  vancomycin (VANCOCIN) IVPB 1000 mg/200 mL premix  Status:  Discontinued     1,000 mg 200 mL/hr over 60 Minutes Intravenous Every 12 hours 06/26/18 1136 06/29/18 1342   06/26/18 0000  vancomycin IVPB  Status:  Discontinued     1,000 mg Intravenous Every 12 hours 06/26/18 1616 06/29/18    06/24/18 1200  vancomycin (VANCOCIN) 1,500 mg in sodium chloride 0.9 % 500 mL IVPB  Status:  Discontinued     1,500 mg 250 mL/hr over 120 Minutes Intravenous Every 24 hours 06/23/18 1040 06/26/18 1057   06/24/18 1000  cefTRIAXone (ROCEPHIN) 2 g in sodium chloride 0.9 % 100 mL IVPB  Status:  Discontinued     2 g 200 mL/hr over 30 Minutes Intravenous Every 24 hours 06/23/18 0857 06/24/18 1433   06/23/18 1848  vancomycin (VANCOCIN) powder  Status:  Discontinued       As needed 06/23/18 1848 06/23/18 1956   06/23/18 1847  gentamicin (GARAMYCIN) injection  Status:  Discontinued       As needed 06/23/18 1847 06/23/18 1956   06/23/18 0900  vancomycin (VANCOCIN) 1,500 mg in sodium chloride 0.9 % 500 mL IVPB  1,500 mg 250 mL/hr over 120 Minutes Intravenous  Once 06/23/18 0855 06/24/18 0814   06/23/18 0900  cefTRIAXone (ROCEPHIN) 1 g in sodium chloride 0.9 % 100 mL IVPB     1 g 200 mL/hr over 30 Minutes Intravenous  Once 06/23/18 0857 06/24/18 0814   06/20/18 0900  cefTRIAXone (ROCEPHIN) 1 g in sodium chloride 0.9 % 100 mL IVPB  Status:  Discontinued     1 g 200 mL/hr over 30 Minutes Intravenous Every 24 hours 06/20/18 0824 06/23/18 0857    .   Subjective  The patient is resting comfortably. He is complaining of back and hip pain.  Objective   Vitals:  Vitals:    07/25/18 0731 07/25/18 1226  BP: 135/72 107/80  Pulse: (!) 104 97  Resp: 20 16  Temp: 97.8 F (36.6 C) 98.3 F (36.8 C)  SpO2: 98% 98%    Exam:  Constitutional:  . Awake,alert, and oriented x 3. No acute distress. Respiratory:  . No increased work of breathing. . No wheezes, rales, or rhonchi. . No tactile fremitus. Cardiovascular:  . Regular rate and rhythm. . No murmurs, ectopy, or gallups. . No lateral PMI. No thrills. Abdomen:  . Abdomen is soft, non-tender, non-distended. . No hernias, masses, or organomegaly . Normoactive bowel sounds.  Musculoskeletal:  . No cyanosis, clubbing, or edema. Skin:  . No rashes, lesions, ulcers . palpation of skin: no induration or nodules Neurologic:  . CN 2-12 intact . Sensation all 4 extremities intact Psychiatric:  . Mental status o Mood, affect appropriate o Orientation to person, place, time  . judgment and insight appear intact    I have personally reviewed the following:   Today's Data  . Vitals, CMP, CBC  Micro Data  . MRSA by PCR negative  Scheduled Meds: . diclofenac sodium  2 g Topical QID  . enoxaparin (LOVENOX) injection  40 mg Subcutaneous Daily  . esomeprazole  20 mg Oral QAC breakfast  . feeding supplement (ENSURE ENLIVE)  237 mL Oral TID BM  . ferrous sulfate  325 mg Oral BID WC  . folic acid  1 mg Oral Daily  . methocarbamol  500 mg Oral TID  . metoprolol tartrate  12.5 mg Oral BID  . multivitamin with minerals  1 tablet Oral Daily  . polyethylene glycol  17 g Oral Daily  . senna-docusate  1 tablet Oral BID  . sodium chloride flush  10-40 mL Intracatheter Q12H  . thiamine  100 mg Oral Daily   Or  . thiamine  100 mg Intravenous Daily  . traMADol  50 mg Oral Q6H  . vitamin B-12  100 mcg Oral Daily   Continuous Infusions: . sodium chloride 10 mL/hr at 07/22/18 2302  . methocarbamol (ROBAXIN) IV    . vancomycin 750 mg (07/25/18 0146)    Principal Problem:   Septic arthritis of multiple joints  (HCC) Active Problems:   Hepatitis C antibody test positive   Cigarette smoker   Intractable back pain   AKI (acute kidney injury) (HCC)   Spinal stenosis of lumbosacral region   GERD (gastroesophageal reflux disease)   Mild protein-calorie malnutrition (HCC)   Septic arthritis of knee, left (HCC)   Acute medial meniscal tear, left, initial encounter   Polysubstance abuse (HCC)   Normocytic anemia   Effusion of left elbow   Recurrent boils   Discitis of lumbosacral region   Psoas abscess, right (HCC)   Multiple abscesses of right shoulder   Chronic  infection of left knee (HCC)   LOS: 35 days    A & P  Sepsis secondary to cellulitis of the right upper extremity multiple abscess large multiple lumbar abscess and septic arthritis of the left knee: Patient presented with tachycardia, leukocytosis and back pain. MRI 5/5 showed severe lumbar spinal stenosis. MRI 5/19 showed L4 S1 discitis, L4-L5 septic arthritis multiple paraspinal abscess. CT right shoulder showed intramuscular and infra facial abscess along with septic arthritis of the [facility] joint. Neurosurgery was consulted and Recommended repeat MRI near the end of antibiotics.  Antibiotics were started 06/23/2018.  He is on vancomycin.  This is for a duration of 6 weeks.  ID consulted, recommend minimum of 6 weeks of IV antibiotics.Antibiotics started 06/23/2018. Orthopedic consulted. Patient is status post left knee arthrocentesis and washout. S/p irrigation and debridement of right shoulder, acromial medial clavicular joint irrigation and debridement on the right. Echocardiogram was negative for vegetation. PT consulted recommending SNF, however the patient has no insurance and a history of illicit drug use. The suture left knee was removed. CPM machine ordered. Added low dose morphine prior patient using CPM machine. He is tolerating CPM machine well.   AKI: Creatinine on admission was 3.5. Normalized. Creatinine today is 0.70.   Normocytic anemia: Hgb stable in the 7.7 - 7.6 range for the last week.  In part this is due to iron deficiency anemia. Start ferrous sulfate and B12 supplement.  Advised him to follow up with PCP for colonoscopy.   Hyponatremia:Due to volume depletion. Resolved with IV fluids. Sodium stable at 134 today.  Constipation:Continue with bowel regimen  Polysubstance abuse: Admits to using cocaine prior to admission. Refuses he is ever used IV heroin. UDS positive for benzodiazepines and cocaine.  Essential Hypertension with tachycardia: Improved on lopressor.Dose increased after PT noted tachycardia on ambulation.  DVT prophylaxis:Lovenox Code Status:Full code Family Communication:None Disposition Plan:Stay in the hospital for IV antibiotics  Briasia Flinders, DO [facility] Direct contact: see [email or web address]  7PM-7AM contact night coverage as above 07/25/2018, 3:44 PM  LOS: 35 days

## 2018-07-25 NOTE — Progress Notes (Signed)
Dressing change kit and accessories sent to [facility]

## 2018-07-25 NOTE — Progress Notes (Signed)
Chart reviewed for LOS B Mychael Soots RN,MHA,BSN Advance Care Supervisor [phone] 

## 2018-07-26 ENCOUNTER — Inpatient Hospital Stay (HOSPITAL_COMMUNITY): Payer: Medicaid Other

## 2018-07-26 LAB — CBC WITH DIFFERENTIAL/PLATELET
Abs Immature Granulocytes: 0.25 10*3/uL — ABNORMAL HIGH (ref 0.00–0.07)
Basophils Absolute: 0.1 10*3/uL (ref 0.0–0.1)
Basophils Relative: 1 %
Eosinophils Absolute: 0.2 10*3/uL (ref 0.0–0.5)
Eosinophils Relative: 1 %
HCT: 24 % — ABNORMAL LOW (ref 39.0–52.0)
Hemoglobin: 7.4 g/dL — ABNORMAL LOW (ref 13.0–17.0)
Immature Granulocytes: 2 %
Lymphocytes Relative: 22 %
Lymphs Abs: 3 10*3/uL (ref 0.7–4.0)
MCH: 26.2 pg (ref 26.0–34.0)
MCHC: 30.8 g/dL (ref 30.0–36.0)
MCV: 85.1 fL (ref 80.0–100.0)
Monocytes Absolute: 0.8 10*3/uL (ref 0.1–1.0)
Monocytes Relative: 6 %
Neutro Abs: 9.1 10*3/uL — ABNORMAL HIGH (ref 1.7–7.7)
Neutrophils Relative %: 68 %
Platelets: 615 10*3/uL — ABNORMAL HIGH (ref 150–400)
RBC: 2.82 MIL/uL — ABNORMAL LOW (ref 4.22–5.81)
RDW: 17.6 % — ABNORMAL HIGH (ref 11.5–15.5)
WBC: 13.4 10*3/uL — ABNORMAL HIGH (ref 4.0–10.5)
nRBC: 0 % (ref 0.0–0.2)

## 2018-07-26 MED ORDER — DIAZEPAM 5 MG PO TABS
5.0000 mg | ORAL_TABLET | Freq: Four times a day (QID) | ORAL | Status: DC | PRN
  Administered 2018-07-26 – 2018-08-03 (×25): 5 mg via ORAL
  Filled 2018-07-26 (×25): qty 1

## 2018-07-26 NOTE — Progress Notes (Signed)
PROGRESS NOTE  Carollee MassedJames Rathbone ZOX:[id number] DOB: 10/05/1965 DOA: 06/19/2018 PCP: Patient, No Pcp Per  Brief History   53 year old with past medical history significant for hepatitis C, who presented to the ED with complaints of increasing back pain, radiating down to the left leg initially but subsequently both legs. Patient was having difficulty standing and walking. Per patient the pain started after he attempted to lift a lawnmower over a week ago. Evaluation in the ED, MRI showed severe foraminal narrowing at level L5-S1.   He subsequently was admitted with sepsis secondary to right upper extremity cellulitis and was started on IV antibiotics, however cellulitis did not improve. He did havea CT scan which showed intra-facial abscess in the right shoulder along with [facility]. Patient had debridement of multiple abscess. During hospitalization patient began to have back pain and right thigh weakness. MRI of the lumbar spine which showed discitis as well as septic arthritis and paraspinal abscess. During hospitalization he did have left knee swelling, he had arthrocentesis which showed gram-positive cocci and had left knee washout. Patient was evaluated by ID who is recommending 6 to 8 weeks of IV antibiotics.  Vancomycin is started 06/23/2018.  Patient in the hospital to finish IV antibiotics.  Consultants  . Infectious disease . Orthopedic surgery . Neurosurgery  Procedures  . Arthrocentesis  Antibiotics   Anti-infectives (From admission, onward)   Start     Dose/Rate Route Frequency Ordered Stop   06/29/18 1400  vancomycin (VANCOCIN) IVPB 750 mg/150 ml premix     750 mg 150 mL/hr over 60 Minutes Intravenous Every 12 hours 06/29/18 1342     06/29/18 0000  vancomycin IVPB     750 mg Intravenous Every 12 hours 06/29/18 1407 08/09/18 2359   06/28/18 1735  gentamicin (GARAMYCIN) injection  Status:  Discontinued       As needed 06/28/18 1736 06/28/18 1838    06/28/18 1734  vancomycin (VANCOCIN) powder  Status:  Discontinued       As needed 06/28/18 1735 06/28/18 1838   06/26/18 1300  vancomycin (VANCOCIN) 1,250 mg in sodium chloride 0.9 % 250 mL IVPB  Status:  Discontinued     1,250 mg 166.7 mL/hr over 90 Minutes Intravenous Every 12 hours 06/26/18 1057 06/26/18 1136   06/26/18 1300  vancomycin (VANCOCIN) IVPB 1000 mg/200 mL premix  Status:  Discontinued     1,000 mg 200 mL/hr over 60 Minutes Intravenous Every 12 hours 06/26/18 1136 06/29/18 1342   06/26/18 0000  vancomycin IVPB  Status:  Discontinued     1,000 mg Intravenous Every 12 hours 06/26/18 1616 06/29/18    06/24/18 1200  vancomycin (VANCOCIN) 1,500 mg in sodium chloride 0.9 % 500 mL IVPB  Status:  Discontinued     1,500 mg 250 mL/hr over 120 Minutes Intravenous Every 24 hours 06/23/18 1040 06/26/18 1057   06/24/18 1000  cefTRIAXone (ROCEPHIN) 2 g in sodium chloride 0.9 % 100 mL IVPB  Status:  Discontinued     2 g 200 mL/hr over 30 Minutes Intravenous Every 24 hours 06/23/18 0857 06/24/18 1433   06/23/18 1848  vancomycin (VANCOCIN) powder  Status:  Discontinued       As needed 06/23/18 1848 06/23/18 1956   06/23/18 1847  gentamicin (GARAMYCIN) injection  Status:  Discontinued       As needed 06/23/18 1847 06/23/18 1956   06/23/18 0900  vancomycin (VANCOCIN) 1,500 mg in sodium chloride 0.9 % 500 mL IVPB  1,500 mg 250 mL/hr over 120 Minutes Intravenous  Once 06/23/18 0855 06/24/18 0814   06/23/18 0900  cefTRIAXone (ROCEPHIN) 1 g in sodium chloride 0.9 % 100 mL IVPB     1 g 200 mL/hr over 30 Minutes Intravenous  Once 06/23/18 0857 06/24/18 0814   06/20/18 0900  cefTRIAXone (ROCEPHIN) 1 g in sodium chloride 0.9 % 100 mL IVPB  Status:  Discontinued     1 g 200 mL/hr over 30 Minutes Intravenous Every 24 hours 06/20/18 0824 06/23/18 0857      Subjective  The patient is resting comfortably. He is very distressed as I enter the room. He states that he is very afraid because he  awoke with severe vertigo. He states that he sometimes gets it with standing, but this time it started when he woke up.  Objective   Vitals:  Vitals:   07/26/18 1025 07/26/18 1152  BP:  129/81  Pulse: 68 100  Resp:  18  Temp:  97.9 F (36.6 C)  SpO2:  100%    Exam:  Constitutional:  . Awake,alert, and oriented x 3. The patient is very anxious and distressed due to vertigo. Eyes: No nystagmus Respiratory:  . No increased work of breathing. . No wheezes, rales, or rhonchi. . No tactile fremitus. Cardiovascular:  . Regular rate and rhythm. . No murmurs, ectopy, or gallups. . No lateral PMI. No thrills. Abdomen:  . Abdomen is soft, non-tender, non-distended. . No hernias, masses, or organomegaly . Normoactive bowel sounds.  Musculoskeletal:  . No cyanosis, clubbing, or edema. Skin:  . No rashes, lesions, ulcers . palpation of skin: no induration or nodules Neurologic:  . CN 2-12 intact . Sensation all 4 extremities intact Psychiatric:  . Mental status o Mood, affect appropriate o Orientation to person, place, time  . judgment and insight appear intact    I have personally reviewed the following:   Today's Data  . Vitals, CMP, CBC  Micro Data  . MRSA by PCR negative  Scheduled Meds: . diclofenac sodium  2 g Topical QID  . enoxaparin (LOVENOX) injection  40 mg Subcutaneous Daily  . esomeprazole  20 mg Oral QAC breakfast  . feeding supplement (ENSURE ENLIVE)  237 mL Oral TID BM  . ferrous sulfate  325 mg Oral BID WC  . folic acid  1 mg Oral Daily  . methocarbamol  500 mg Oral TID  . metoprolol tartrate  12.5 mg Oral BID  . multivitamin with minerals  1 tablet Oral Daily  . polyethylene glycol  17 g Oral Daily  . senna-docusate  1 tablet Oral BID  . sodium chloride flush  10-40 mL Intracatheter Q12H  . thiamine  100 mg Oral Daily   Or  . thiamine  100 mg Intravenous Daily  . traMADol  50 mg Oral Q6H  . vitamin B-12  100 mcg Oral Daily   Continuous  Infusions: . sodium chloride 10 mL/hr at 07/22/18 2302  . methocarbamol (ROBAXIN) IV    . vancomycin 750 mg (07/26/18 0308)    Principal Problem:   Septic arthritis of multiple joints (HCC) Active Problems:   Hepatitis C antibody test positive   Cigarette smoker   Intractable back pain   AKI (acute kidney injury) ([place])   Spinal stenosis of lumbosacral region   GERD (gastroesophageal reflux disease)   Mild protein-calorie malnutrition (HCC)   Septic arthritis of knee, left (HCC)   Acute medial meniscal tear, left, initial encounter   Polysubstance abuse (  HCC)   Normocytic anemia   Effusion of left elbow   Recurrent boils   Discitis of lumbosacral region   Psoas abscess, right (HCC)   Multiple abscesses of right shoulder   Chronic infection of left knee (HCC)   LOS: 36 days    A & P  Sepsis secondary to cellulitis of the right upper extremity multiple abscess large multiple lumbar abscess and septic arthritis of the left knee: Patient presented with tachycardia, leukocytosis and back pain. MRI 5/5 showed severe lumbar spinal stenosis. MRI 5/19 showed L4 S1 discitis, L4-L5 septic arthritis multiple paraspinal abscess. CT right shoulder showed intramuscular and infra facial abscess along with [facility]. Neurosurgery was consulted and Recommended repeat MRI near the end of antibiotics.  Antibiotics were started 06/23/2018.  He is on vancomycin.  This is for a duration of 6 weeks.  ID consulted, recommend minimum of 6 weeks of IV antibiotics.Antibiotics started 06/23/2018. Orthopedic consulted. Patient is status post left knee arthrocentesis and washout. S/p irrigation and debridement of right shoulder, acromial medial clavicular joint irrigation and debridement on the right. Echocardiogram was negative for vegetation. PT consulted recommending SNF, however the patient has no insurance and a history of illicit drug use. The suture left knee was removed. CPM  machine ordered. Added low dose morphine prior patient using CPM machine. He is tolerating CPM machine well.   Vertigo: The patient states that he has vertigo sometimes when he stands up. He states that today is different because it started just upon awakening. The patient will go for a CT head. I have ordered valium to help with vertigo and anxiety.  AKI: Creatinine on admission was 3.5. Normalized. Creatinine today is 0.70.  Normocytic anemia: Hgb stable in the 7.7 - 7.6 range for the last week.  7.4 today. In part this is due to iron deficiency anemia. Start ferrous sulfate and B12 supplement.  Advised him to follow up with PCP for colonoscopy.   Hyponatremia:Due to volume depletion. Resolved with IV fluids. Monitor.  Constipation:Continue with bowel regimen  Polysubstance abuse: Admits to using cocaine prior to admission. Refuses he is ever used IV heroin. UDS positive for benzodiazepines and cocaine.  Essential Hypertension with tachycardia: Improved on lopressor.Dose increased after PT noted tachycardia on ambulation.  I have seen and examined this patient myself. I have spent 36 minutes in his evaluation and care.  DVT prophylaxis:Lovenox Code Status:Full code Family Communication:None Disposition Plan:Stay in the hospital for IV antibiotics  Marcel Sorter, DO [facility] Direct contact: see [email or web address]  7PM-7AM contact night coverage as above 07/26/2018, 1:25 PM  LOS: 35 days

## 2018-07-27 DIAGNOSIS — K5900 Constipation, unspecified: Secondary | ICD-10-CM

## 2018-07-27 DIAGNOSIS — R42 Dizziness and giddiness: Secondary | ICD-10-CM

## 2018-07-27 LAB — BASIC METABOLIC PANEL
Anion gap: 10 (ref 5–15)
BUN: 17 mg/dL (ref 6–20)
CO2: 28 mmol/L (ref 22–32)
Calcium: 9.1 mg/dL (ref 8.9–10.3)
Chloride: 97 mmol/L — ABNORMAL LOW (ref 98–111)
Creatinine, Ser: 0.69 mg/dL (ref 0.61–1.24)
GFR calc Af Amer: >60 mL/min (ref >60)
GFR calc non Af Amer: >60 mL/min (ref >60)
Glucose, Bld: 106 mg/dL — ABNORMAL HIGH (ref 70–99)
Potassium: 3.8 mmol/L (ref 3.5–5.1)
Sodium: 135 mmol/L (ref 135–145)

## 2018-07-27 LAB — CBC WITH DIFFERENTIAL/PLATELET
Abs Immature Granulocytes: 0.21 10*3/uL — ABNORMAL HIGH (ref 0.00–0.07)
Basophils Absolute: 0.1 10*3/uL (ref 0.0–0.1)
Basophils Relative: 1 %
Eosinophils Absolute: 0.1 10*3/uL (ref 0.0–0.5)
Eosinophils Relative: 1 %
HCT: 24.4 % — ABNORMAL LOW (ref 39.0–52.0)
Hemoglobin: 7.4 g/dL — ABNORMAL LOW (ref 13.0–17.0)
Immature Granulocytes: 2 %
Lymphocytes Relative: 22 %
Lymphs Abs: 2.9 10*3/uL (ref 0.7–4.0)
MCH: 25.7 pg — ABNORMAL LOW (ref 26.0–34.0)
MCHC: 30.3 g/dL (ref 30.0–36.0)
MCV: 84.7 fL (ref 80.0–100.0)
Monocytes Absolute: 0.9 10*3/uL (ref 0.1–1.0)
Monocytes Relative: 7 %
Neutro Abs: 9 10*3/uL — ABNORMAL HIGH (ref 1.7–7.7)
Neutrophils Relative %: 67 %
Platelets: 635 10*3/uL — ABNORMAL HIGH (ref 150–400)
RBC: 2.88 MIL/uL — ABNORMAL LOW (ref 4.22–5.81)
RDW: 17.5 % — ABNORMAL HIGH (ref 11.5–15.5)
WBC: 13.2 10*3/uL — ABNORMAL HIGH (ref 4.0–10.5)
nRBC: 0 % (ref 0.0–0.2)

## 2018-07-27 MED ORDER — DOCUSATE SODIUM 100 MG PO CAPS
200.0000 mg | ORAL_CAPSULE | Freq: Every day | ORAL | Status: DC
  Administered 2018-07-28 – 2018-08-10 (×10): 200 mg via ORAL
  Filled 2018-07-27 (×14): qty 2

## 2018-07-27 MED ORDER — DOCUSATE CALCIUM 240 MG PO CAPS: 240.0000 mg | ORAL_CAPSULE | Freq: Every day | ORAL | Status: DC

## 2018-07-27 MED ORDER — POLYETHYLENE GLYCOL 3350 17 G PO PACK
17.0000 g | PACK | Freq: Two times a day (BID) | ORAL | Status: DC
  Administered 2018-07-27 – 2018-08-03 (×6): 17 g via ORAL
  Filled 2018-07-27 (×17): qty 1

## 2018-07-27 NOTE — Progress Notes (Signed)
Physical Therapy Treatment Patient Details Name: [name] MRN: [id number] DOB: 05/08/1965 Today's Date: 07/27/2018    History of Present Illness Pt. with PMH of hep C, substance abuse; admitted on 06/19/2018, presented with complaint of back pain, was found to have musculoskeletal back pain as well as right upper extremity cellulitis; Patient had a debridement of his shoulder perfromed on 5/20.     PT Comments    Pt progressing slowly with functional mobility. Plan was to initiate gait training this session however as soon as pt stood up, he began yelling out in pain. Pt states "My hip, my back! Something is wrong in there!" and began crying. He was able to tolerate another brief stand to change out bed linen, but not able to progress to gait training this session. Pt was educated on the benefits of mobility and getting out of the bed to the chair, using BSC, etc. Pt with many reasons as to why he cannot do these things, and with many complaints in general. Pt educated that immobility can exacerbate musculoskeletal pain and encouraged pt sit EOB for meals. Will continue to follow and progress as able per POC.    Follow Up Recommendations  SNF     Equipment Recommendations  Other (comment)    Recommendations for Other Services Rehab consult     Precautions / Restrictions Precautions Precautions: Shoulder Type of Shoulder Precautions: Active protocol: PROM/AROM shoulder FF to 90, abduct to 60, ER to 30; verbal order per Dr. August Saucerean on 5/25 AROM elbow/wrist/hand as tolerated  Shoulder Interventions: Shoulder sling/immobilizer;For comfort Required Braces or Orthoses: Sling Restrictions Weight Bearing Restrictions: Yes RUE Weight Bearing: Non weight bearing LLE Weight Bearing: Weight bearing as tolerated    Mobility  Bed Mobility Overal bed mobility: Needs Assistance Bed Mobility: Supine to Sit;Sit to Supine     Supine to sit: Min assist;+2 for physical assistance Sit to supine:  Mod assist;+2 for safety/equipment   General bed mobility comments: Advanced LE's initially but required assist to complete. Pt reaching for therapist's hand to pull to full sitting position.   Transfers Overall transfer level: Needs assistance Equipment used: Rolling walker (2 wheeled) Transfers: Sit to/from Stand Sit to Stand: Mod assist;+2 physical assistance;+2 safety/equipment   Squat pivot transfers: Mod assist;+2 safety/equipment;+2 physical assistance     General transfer comment: stood with RW x2. Initially, pt yelling out in pain, stating his R hip and low back were hurting. Pt then began crying, stating "something's wrong in there." Pt was able to tolerate a second stand with RW to get soiled linen out and a clean bed pad under him.   Ambulation/Gait             General Gait Details: Unable   Stairs             Wheelchair Mobility    Modified Rankin (Stroke Patients Only)       Balance Overall balance assessment: Needs assistance Sitting-balance support: No upper extremity supported;Feet supported Sitting balance-Leahy Scale: Fair Sitting balance - Comments: needing supervision for static balance, holds onto EOB for support Postural control: Left lateral lean Standing balance support: Bilateral upper extremity supported;During functional activity Standing balance-Leahy Scale: Poor Standing balance comment: heavily reliant on external support                            Cognition Arousal/Alertness: Awake/alert Behavior During Therapy: WFL for tasks assessed/performed Overall Cognitive Status: Within Functional Limits for  tasks assessed                                 General Comments: Self-limiting      Exercises      General Comments        Pertinent Vitals/Pain Pain Assessment: Faces Faces Pain Scale: Hurts whole lot Pain Location: R hip/low back Pain Descriptors / Indicators:  Sore;Grimacing;Guarding;Moaning;Crying Pain Intervention(s): Limited activity within patient's tolerance;Monitored during session;Repositioned    Home Living                      Prior Function            PT Goals (current goals can now be found in the care plan section) Acute Rehab PT Goals Patient Stated Goal: "Be able to walk so I can go home" PT Goal Formulation: With patient Time For Goal Achievement: 07/05/18 Potential to Achieve Goals: Good Progress towards PT goals: Progressing toward goals    Frequency    Min 3X/week      PT Plan Current plan remains appropriate    Co-evaluation PT/OT/SLP Co-Evaluation/Treatment: Yes Reason for Co-Treatment: Complexity of the patient's impairments (multi-system involvement);For patient/therapist safety;To address functional/ADL transfers PT goals addressed during session: Mobility/safety with mobility;Balance;Proper use of DME        AM-PAC PT "6 Clicks" Mobility   Outcome Measure  Help needed turning from your back to your side while in a flat bed without using bedrails?: A Lot Help needed moving from lying on your back to sitting on the side of a flat bed without using bedrails?: A Lot Help needed moving to and from a bed to a chair (including a wheelchair)?: A Lot Help needed standing up from a chair using your arms (e.g., wheelchair or bedside chair)?: A Lot Help needed to walk in hospital room?: Total Help needed climbing 3-5 steps with a railing? : Total 6 Click Score: 10    End of Session Equipment Utilized During Treatment: Gait belt Activity Tolerance: Patient limited by pain Patient left: in bed;with call bell/phone within reach;with bed alarm set Nurse Communication: Mobility status PT Visit Diagnosis: Unsteadiness on feet (R26.81);Muscle weakness (generalized) (M62.81);Pain;Other abnormalities of gait and mobility (R26.89) Pain - Right/Left: Right Pain - part of body: Arm(back)     Time:  7017-7939 PT Time Calculation (min) (ACUTE ONLY): 33 min  Charges:  $Gait Training: 8-22 mins                     Rolinda Roan, PT, DPT Acute Rehabilitation Services Pager: [phone] Office: [phone]    Thelma Comp 07/27/2018, 1:54 PM

## 2018-07-27 NOTE — Progress Notes (Signed)
Occupational Therapy Treatment Patient Details Name: [name] Holzman MRN: [id number] DOB: 06/26/1965 Today's Date: 07/27/2018    History of present illness Pt. with PMH of hep C, substance abuse; admitted on 06/19/2018, presented with complaint of back pain, was found to have musculoskeletal back pain as well as right upper extremity cellulitis; Patient had a debridement of his shoulder perfromed on 5/20.    OT comments  Pt performing RUE shoulder through digit exercises well with minimal pain and improved ROM. Pt self limits for OOB mobility stating that he cannot take any steps and that he feels like something is wrong with his back. Pt unable to progress from sit to stands at this time. OT to continue to follow pt as he is very limited with therapy due to no insurance. Pt requires continued OT. OT following acutely. Pt reported no dizziness today, but reports that the pain "knocked the wind out of me."  Requesting verification of NWB status of RUE.   Follow Up Recommendations  SNF;Supervision/Assistance - 24 hour    Equipment Recommendations  Wheelchair (measurements OT)(if d/c and unable to ambulate)    Recommendations for Other Services      Precautions / Restrictions Precautions Precautions: Shoulder Type of Shoulder Precautions: Active protocol: PROM/AROM shoulder FF to 90, abduct to 60, ER to 30; verbal order per Dr. August Saucerean on 5/25 AROM elbow/wrist/hand as tolerated  Shoulder Interventions: Shoulder sling/immobilizer;For comfort Required Braces or Orthoses: Sling Restrictions Weight Bearing Restrictions: Yes RUE Weight Bearing: Non weight bearing LLE Weight Bearing: Weight bearing as tolerated       Mobility Bed Mobility Overal bed mobility: Needs Assistance Bed Mobility: Supine to Sit;Sit to Supine     Supine to sit: Min assist;+2 for physical assistance Sit to supine: Mod assist;+2 for safety/equipment   General bed mobility comments: Advanced LE's initially but  required assist to complete. Pt reaching for therapist's hand to pull to full sitting position.   Transfers Overall transfer level: Needs assistance Equipment used: Rolling walker (2 wheeled) Transfers: Sit to/from Stand Sit to Stand: Mod assist;+2 physical assistance;+2 safety/equipment   Squat pivot transfers: Mod assist;+2 safety/equipment;+2 physical assistance     General transfer comment: stood with RW x2. Initially, pt yelling out in pain, stating his R hip and low back were hurting. Pt then began crying, stating "something's wrong in there." Pt was able to tolerate a second stand with RW to get soiled linen out and a clean bed pad under him.     Balance Overall balance assessment: Needs assistance Sitting-balance support: No upper extremity supported;Feet supported Sitting balance-Leahy Scale: Fair Sitting balance - Comments: needing supervision for static balance, holds onto EOB for support Postural control: Left lateral lean Standing balance support: Bilateral upper extremity supported;During functional activity Standing balance-Leahy Scale: Poor Standing balance comment: heavily reliant on external support                           ADL either performed or assessed with clinical judgement   ADL Overall ADL's : Needs assistance/impaired                                     Functional mobility during ADLs: Minimal assistance;Moderate assistance;+2 for physical assistance;+2 for safety/equipment;Rolling walker General ADL Comments: Unable to obtain orthostatics- pt screaming in pain with R hip stating "I can't move- my leg is numb."  Vision       Perception     Praxis      Cognition Arousal/Alertness: Awake/alert Behavior During Therapy: WFL for tasks assessed/performed Overall Cognitive Status: Within Functional Limits for tasks assessed                                 General Comments: Self-limiting         Exercises General Exercises - Upper Extremity Shoulder Flexion: AROM;AAROM;10 reps;Seated Shoulder ABduction: AAROM;10 reps;Seated Elbow Flexion: AROM;10 reps;Seated Elbow Extension: AROM;10 reps;Seated Wrist Flexion: AROM;10 reps;Seated Wrist Extension: AROM;10 reps;Seated Digit Composite Flexion: AROM;10 reps;Supine;Right Composite Extension: AROM;10 reps;Supine;Right Shoulder Exercises Shoulder External Rotation: PROM;AROM;Right;10 reps;Supine;Limitations   Shoulder Instructions       General Comments Pt not progressing iwth therapy. Pt aware of impending discharge date. Pt strongly encouraged to continue to work with therapy.    Pertinent Vitals/ Pain       Pain Assessment: Faces Faces Pain Scale: Hurts whole lot Pain Location: R hip/low back Pain Descriptors / Indicators: Sore;Grimacing;Guarding;Moaning;Crying Pain Intervention(s): Limited activity within patient's tolerance;Repositioned  Home Living                                          Prior Functioning/Environment              Frequency  Min 3X/week        Progress Toward Goals  OT Goals(current goals can now be found in the care plan section)  Progress towards OT goals: Not progressing toward goals - comment(pt self limiting behavior due to pain. unable to take steps )  Acute Rehab OT Goals Patient Stated Goal: "Be able to walk so I can go home" OT Goal Formulation: With patient Time For Goal Achievement: 08/10/18 Potential to Achieve Goals: Good ADL Goals Pt Will Perform Grooming: with modified independence;sitting Pt Will Perform Upper Body Dressing: with modified independence;sitting Pt Will Perform Lower Body Dressing: with modified independence;sitting/lateral leans;sit to/from stand Pt Will Transfer to Toilet: with min guard assist;ambulating;bedside commode Pt/caregiver will Perform Home Exercise Program: Both right and left upper extremity;With Supervision;With written  HEP provided Additional ADL Goal #1: Pt will sit EOB for ADL tasks with set-upA and fair sitting balance  Plan Discharge plan remains appropriate    Co-evaluation    PT/OT/SLP Co-Evaluation/Treatment: Yes Reason for Co-Treatment: Complexity of the patient's impairments (multi-system involvement) PT goals addressed during session: Mobility/safety with mobility;Balance;Proper use of DME OT goals addressed during session: Strengthening/ROM      AM-PAC OT "6 Clicks" Daily Activity     Outcome Measure   Help from another person eating meals?: None Help from another person taking care of personal grooming?: None Help from another person toileting, which includes using toliet, bedpan, or urinal?: A Lot Help from another person bathing (including washing, rinsing, drying)?: A Lot Help from another person to put on and taking off regular upper body clothing?: A Lot Help from another person to put on and taking off regular lower body clothing?: Total 6 Click Score: 15    End of Session Equipment Utilized During Treatment: Rolling walker;Gait belt CPM Left Knee CPM Left Knee: Off  OT Visit Diagnosis: Muscle weakness (generalized) (M62.81);Pain Pain - Right/Left: Right Pain - part of body: Hip;Leg;Shoulder   Activity Tolerance Patient limited by pain   Patient Left in  bed;with call bell/phone within reach;with bed alarm set   Nurse Communication Mobility status        Time: [id number] OT Time Calculation (min): 27 min  Charges: OT General Charges $OT Visit: 1 Visit OT Treatments $Therapeutic Exercise: 8-22 mins  Cristi LoronAllison (Jelenek) [place] OTR/L Acute Rehabilitation Services Pager: [phone] Office: [phone]    Lonzo CloudLLISON J Don Tiu 07/27/2018, 2:41 PM

## 2018-07-27 NOTE — Progress Notes (Signed)
PROGRESS NOTE  Clifford MassedJames Chan BMW:[id number] DOB: 04/07/1965 DOA: 06/19/2018 PCP: Patient, No Pcp Per  Brief History   53 year old with past medical history significant for hepatitis C, who presented to the ED with complaints of increasing back pain, radiating down to the left leg initially but subsequently both legs. Patient was having difficulty standing and walking. Per patient the pain started after he attempted to lift a lawnmower over a week ago. Evaluation in the ED, MRI showed severe foraminal narrowing at level L5-S1.   He subsequently was admitted with sepsis secondary to right upper extremity cellulitis and was started on IV antibiotics, however cellulitis did not improve. He did havea CT scan which showed intra-facial abscess in the right shoulder along with septic [facility]. Patient had debridement of multiple abscess. During hospitalization patient began to have back pain and right thigh weakness. MRI of the lumbar spine which showed discitis as well as septic arthritis and paraspinal abscess. During hospitalization he did have left knee swelling, he had arthrocentesis which showed gram-positive cocci and had left knee washout.  Patient was evaluated by ID who is recommending 6 to 8 weeks of IV antibiotics.  Vancomycin is started 06/23/2018.  Patient in the hospital to finish IV antibiotics.  Consultants  . Infectious disease . Orthopedic surgery . Neurosurgery  Procedures  . Arthrocentesis  Antibiotics   Anti-infectives (From admission, onward)   Start     Dose/Rate Route Frequency Ordered Stop   06/29/18 1400  vancomycin (VANCOCIN) IVPB 750 mg/150 ml premix     750 mg 150 mL/hr over 60 Minutes Intravenous Every 12 hours 06/29/18 1342     06/29/18 0000  vancomycin IVPB     750 mg Intravenous Every 12 hours 06/29/18 1407 08/09/18 2359   06/28/18 1735  gentamicin (GARAMYCIN) injection  Status:  Discontinued       As needed 06/28/18 1736 06/28/18  1838   06/28/18 1734  vancomycin (VANCOCIN) powder  Status:  Discontinued       As needed 06/28/18 1735 06/28/18 1838   06/26/18 1300  vancomycin (VANCOCIN) 1,250 mg in sodium chloride 0.9 % 250 mL IVPB  Status:  Discontinued     1,250 mg 166.7 mL/hr over 90 Minutes Intravenous Every 12 hours 06/26/18 1057 06/26/18 1136   06/26/18 1300  vancomycin (VANCOCIN) IVPB 1000 mg/200 mL premix  Status:  Discontinued     1,000 mg 200 mL/hr over 60 Minutes Intravenous Every 12 hours 06/26/18 1136 06/29/18 1342   06/26/18 0000  vancomycin IVPB  Status:  Discontinued     1,000 mg Intravenous Every 12 hours 06/26/18 1616 06/29/18    06/24/18 1200  vancomycin (VANCOCIN) 1,500 mg in sodium chloride 0.9 % 500 mL IVPB  Status:  Discontinued     1,500 mg 250 mL/hr over 120 Minutes Intravenous Every 24 hours 06/23/18 1040 06/26/18 1057   06/24/18 1000  cefTRIAXone (ROCEPHIN) 2 g in sodium chloride 0.9 % 100 mL IVPB  Status:  Discontinued     2 g 200 mL/hr over 30 Minutes Intravenous Every 24 hours 06/23/18 0857 06/24/18 1433   06/23/18 1848  vancomycin (VANCOCIN) powder  Status:  Discontinued       As needed 06/23/18 1848 06/23/18 1956   06/23/18 1847  gentamicin (GARAMYCIN) injection  Status:  Discontinued       As needed 06/23/18 1847 06/23/18 1956   06/23/18 0900  vancomycin (VANCOCIN) 1,500 mg in sodium chloride 0.9 % 500 mL IVPB  1,500 mg 250 mL/hr over 120 Minutes Intravenous  Once 06/23/18 0855 06/24/18 0814   06/23/18 0900  cefTRIAXone (ROCEPHIN) 1 g in sodium chloride 0.9 % 100 mL IVPB     1 g 200 mL/hr over 30 Minutes Intravenous  Once 06/23/18 0857 06/24/18 0814   06/20/18 0900  cefTRIAXone (ROCEPHIN) 1 g in sodium chloride 0.9 % 100 mL IVPB  Status:  Discontinued     1 g 200 mL/hr over 30 Minutes Intravenous Every 24 hours 06/20/18 0824 06/23/18 0857      Subjective  The patient is resting comfortably. He states that he is feeling much better. He states that the Valium helped quite a bit  with his vertigo and anxiety yesterday. He also states that his significant constipation was resolved with 7 BM's yesterday.  Objective   Vitals:  Vitals:   07/27/18 0731 07/27/18 1132  BP: 118/84 118/78  Pulse: (!) 103 (!) 106  Resp: 18 18  Temp: 98 F (36.7 C) 97.7 F (36.5 C)  SpO2: 98% 98%    Exam:  Constitutional:  Awake,alert, and oriented x 3. No acute distress. Respiratory:  . No increased work of breathing. . No wheezes, rales, or rhonchi. . No tactile fremitus. Cardiovascular:  . Regular rate and rhythm. . No murmurs, ectopy, or gallups. . No lateral PMI. No thrills. Abdomen:  . Abdomen is soft, non-tender, non-distended. . No hernias, masses, or organomegaly . Normoactive bowel sounds.  Musculoskeletal:  . No cyanosis, clubbing, or edema. Skin:  . No rashes, lesions, ulcers . palpation of skin: no induration or nodules Neurologic:  . CN 2-12 intact . Sensation all 4 extremities intact Psychiatric:  . Mental status o Mood, affect appropriate o Orientation to person, place, time  . judgment and insight appear intact    I have personally reviewed the following:   Today's Data  . Vitals, CMP, CBC  Micro Data  . MRSA by PCR negative  Scheduled Meds: . diclofenac sodium  2 g Topical QID  . docusate sodium  200 mg Oral Daily  . enoxaparin (LOVENOX) injection  40 mg Subcutaneous Daily  . esomeprazole  20 mg Oral QAC breakfast  . feeding supplement (ENSURE ENLIVE)  237 mL Oral TID BM  . ferrous sulfate  325 mg Oral BID WC  . folic acid  1 mg Oral Daily  . methocarbamol  500 mg Oral TID  . metoprolol tartrate  12.5 mg Oral BID  . multivitamin with minerals  1 tablet Oral Daily  . polyethylene glycol  17 g Oral BID  . sodium chloride flush  10-40 mL Intracatheter Q12H  . thiamine  100 mg Oral Daily   Or  . thiamine  100 mg Intravenous Daily  . traMADol  50 mg Oral Q6H  . vitamin B-12  100 mcg Oral Daily   Continuous Infusions: . sodium  chloride 10 mL/hr at 07/22/18 2302  . methocarbamol (ROBAXIN) IV    . vancomycin 750 mg (07/27/18 0143)    Principal Problem:   Septic arthritis of multiple joints (HCC) Active Problems:   Hepatitis C antibody test positive   Cigarette smoker   Intractable back pain   AKI (acute kidney injury) (HCC)   Spinal stenosis of lumbosacral region   GERD (gastroesophageal reflux disease)   Mild protein-calorie malnutrition (HCC)   Septic arthritis of knee, left (HCC)   Acute medial meniscal tear, left, initial encounter   Polysubstance abuse (HCC)   Normocytic anemia   Effusion of  left elbow   Recurrent boils   Discitis of lumbosacral region   Psoas abscess, right (HCC)   Multiple abscesses of right shoulder   Chronic infection of left knee (HCC)   LOS: 37 days    A & P  Sepsis secondary to cellulitis of the right upper extremity multiple abscess large multiple lumbar abscess and septic arthritis of the left knee: Patient presented with tachycardia, leukocytosis and back pain. MRI 5/5 showed severe lumbar spinal stenosis. MRI 5/19 showed L4 S1 discitis, L4-L5 septic arthritis multiple paraspinal abscess. CT right shoulder showed intramuscular and infra facial abscess along with septic arthritis of the [facility] joint. Neurosurgery was consulted and Recommended repeat MRI near the end of antibiotics.  Antibiotics were started 06/23/2018.  He is on vancomycin.  This is for a duration of 6 weeks.  ID consulted, recommend minimum of 6 weeks of IV antibiotics.Antibiotics started 06/23/2018. Orthopedic consulted. Patient is status post left knee arthrocentesis and washout. S/p irrigation and debridement of right shoulder, acromial medial clavicular joint irrigation and debridement on the right. Echocardiogram was negative for vegetation. PT consulted recommending SNF, however the patient has no insurance and a history of illicit drug use. The suture left knee was removed. CPM machine ordered. Added  low dose morphine prior patient using CPM machine. He is tolerating CPM machine well.   Vertigo: Resolved with Valium.  Constipation: Resolved. Will continue miralax and docusate sodium daily.  AKI: Creatinine on admission was 3.5. Normalized. Creatinine today is 0.70.  Normocytic anemia: Hgb stable in the 7.7 - 7.6 range for the last week.  7.4 today. In part this is due to iron deficiency anemia. Start ferrous sulfate and B12 supplement.  Advised him to follow up with PCP for colonoscopy.   Hyponatremia:Due to volume depletion. Resolved with IV fluids. Monitor.  Constipation:Continue with bowel regimen  Polysubstance abuse: Admits to using cocaine prior to admission. Refuses he is ever used IV heroin. UDS positive for benzodiazepines and cocaine.  Essential Hypertension with tachycardia: Improved on lopressor.Dose increased after PT noted tachycardia on ambulation.  I have seen and examined this patient myself. I have spent 30 minutes in his evaluation and care.  DVT prophylaxis:Lovenox Code Status:Full code Family Communication:None Disposition Plan:Stay in the hospital for IV antibiotics  Aryan Sparks, DO [facility] Direct contact: see [email or web address]  7PM-7AM contact night coverage as above 07/27/2018, 12:28 PM  LOS: 35 days

## 2018-07-28 LAB — BASIC METABOLIC PANEL
Anion gap: 8 (ref 5–15)
BUN: 18 mg/dL (ref 6–20)
CO2: 29 mmol/L (ref 22–32)
Calcium: 8.9 mg/dL (ref 8.9–10.3)
Chloride: 97 mmol/L — ABNORMAL LOW (ref 98–111)
Creatinine, Ser: 0.62 mg/dL (ref 0.61–1.24)
GFR calc Af Amer: >60 mL/min (ref >60)
GFR calc non Af Amer: >60 mL/min (ref >60)
Glucose, Bld: 98 mg/dL (ref 70–99)
Potassium: 3.9 mmol/L (ref 3.5–5.1)
Sodium: 134 mmol/L — ABNORMAL LOW (ref 135–145)

## 2018-07-28 LAB — CBC WITH DIFFERENTIAL/PLATELET
Abs Immature Granulocytes: 0.2 10*3/uL — ABNORMAL HIGH (ref 0.00–0.07)
Basophils Absolute: 0.1 10*3/uL (ref 0.0–0.1)
Basophils Relative: 0 %
Eosinophils Absolute: 0.2 10*3/uL (ref 0.0–0.5)
Eosinophils Relative: 1 %
HCT: 24.6 % — ABNORMAL LOW (ref 39.0–52.0)
Hemoglobin: 7.5 g/dL — ABNORMAL LOW (ref 13.0–17.0)
Immature Granulocytes: 2 %
Lymphocytes Relative: 25 %
Lymphs Abs: 3.4 10*3/uL (ref 0.7–4.0)
MCH: 25.6 pg — ABNORMAL LOW (ref 26.0–34.0)
MCHC: 30.5 g/dL (ref 30.0–36.0)
MCV: 84 fL (ref 80.0–100.0)
Monocytes Absolute: 0.9 10*3/uL (ref 0.1–1.0)
Monocytes Relative: 6 %
Neutro Abs: 9 10*3/uL — ABNORMAL HIGH (ref 1.7–7.7)
Neutrophils Relative %: 66 %
Platelets: 627 10*3/uL — ABNORMAL HIGH (ref 150–400)
RBC: 2.93 MIL/uL — ABNORMAL LOW (ref 4.22–5.81)
RDW: 17.7 % — ABNORMAL HIGH (ref 11.5–15.5)
WBC: 13.8 10*3/uL — ABNORMAL HIGH (ref 4.0–10.5)
nRBC: 0 % (ref 0.0–0.2)

## 2018-07-28 NOTE — Progress Notes (Signed)
PROGRESS NOTE  Broughton Eppinger IZT:[id number] DOB: July 16, 1965 DOA: 06/19/2018 PCP: Patient, No Pcp Per  Brief History   53 year old with past medical history significant for hepatitis C, who presented to the ED with complaints of increasing back pain, radiating down to the left leg initially but subsequently both legs. Patient was having difficulty standing and walking. Per patient the pain started after he attempted to lift a lawnmower over a week ago. Evaluation in the ED, MRI showed severe foraminal narrowing at level L5-S1.   He subsequently was admitted with sepsis secondary to right upper extremity cellulitis and was started on IV antibiotics, however cellulitis did not improve. He did havea CT scan which showed intra-facial abscess in the right shoulder along with septic arthritis of the [facility] joint. Patient had debridement of multiple abscess. During hospitalization patient began to have back pain and right thigh weakness. MRI of the lumbar spine which showed discitis as well as septic arthritis and paraspinal abscess. During hospitalization he did have left knee swelling, he had arthrocentesis which showed gram-positive cocci and had left knee washout.  Patient was evaluated by ID who is recommending 6 to 8 weeks of IV antibiotics.  Vancomycin is started 06/23/2018.  Patient in the hospital to finish IV antibiotics. His participation with PT has been limited by pain in his back.  Consultants  . Infectious disease . Orthopedic surgery . Neurosurgery  Procedures  . Arthrocentesis  Antibiotics   Anti-infectives (From admission, onward)   Start     Dose/Rate Route Frequency Ordered Stop   06/29/18 1400  vancomycin (VANCOCIN) IVPB 750 mg/150 ml premix     750 mg 150 mL/hr over 60 Minutes Intravenous Every 12 hours 06/29/18 1342     06/29/18 0000  vancomycin IVPB     750 mg Intravenous Every 12 hours 06/29/18 1407 08/09/18 2359   06/28/18 1735  gentamicin (GARAMYCIN) injection   Status:  Discontinued       As needed 06/28/18 1736 06/28/18 1838   06/28/18 1734  vancomycin (VANCOCIN) powder  Status:  Discontinued       As needed 06/28/18 1735 06/28/18 1838   06/26/18 1300  vancomycin (VANCOCIN) 1,250 mg in sodium chloride 0.9 % 250 mL IVPB  Status:  Discontinued     1,250 mg 166.7 mL/hr over 90 Minutes Intravenous Every 12 hours 06/26/18 1057 06/26/18 1136   06/26/18 1300  vancomycin (VANCOCIN) IVPB 1000 mg/200 mL premix  Status:  Discontinued     1,000 mg 200 mL/hr over 60 Minutes Intravenous Every 12 hours 06/26/18 1136 06/29/18 1342   06/26/18 0000  vancomycin IVPB  Status:  Discontinued     1,000 mg Intravenous Every 12 hours 06/26/18 1616 06/29/18    06/24/18 1200  vancomycin (VANCOCIN) 1,500 mg in sodium chloride 0.9 % 500 mL IVPB  Status:  Discontinued     1,500 mg 250 mL/hr over 120 Minutes Intravenous Every 24 hours 06/23/18 1040 06/26/18 1057   06/24/18 1000  cefTRIAXone (ROCEPHIN) 2 g in sodium chloride 0.9 % 100 mL IVPB  Status:  Discontinued     2 g 200 mL/hr over 30 Minutes Intravenous Every 24 hours 06/23/18 0857 06/24/18 1433   06/23/18 1848  vancomycin (VANCOCIN) powder  Status:  Discontinued       As needed 06/23/18 1848 06/23/18 1956   06/23/18 1847  gentamicin (GARAMYCIN) injection  Status:  Discontinued       As needed 06/23/18 1847 06/23/18 1956   06/23/18 0900  vancomycin (VANCOCIN)  1,500 mg in sodium chloride 0.9 % 500 mL IVPB     1,500 mg 250 mL/hr over 120 Minutes Intravenous  Once 06/23/18 0855 06/24/18 0814   06/23/18 0900  cefTRIAXone (ROCEPHIN) 1 g in sodium chloride 0.9 % 100 mL IVPB     1 g 200 mL/hr over 30 Minutes Intravenous  Once 06/23/18 0857 06/24/18 0814   06/20/18 0900  cefTRIAXone (ROCEPHIN) 1 g in sodium chloride 0.9 % 100 mL IVPB  Status:  Discontinued     1 g 200 mL/hr over 30 Minutes Intravenous Every 24 hours 06/20/18 0824 06/23/18 0857      Subjective  The patient is resting comfortably. No new complaints.   Objective   Vitals:  Vitals:   07/28/18 0725 07/28/18 1129  BP: 107/76 119/78  Pulse: 99 100  Resp: 16 14  Temp: 98 F (36.7 C) 98 F (36.7 C)  SpO2: 98% 97%    Exam:  Constitutional:  Awake,alert, and oriented x 3. No acute distress. Respiratory:  . No increased work of breathing. . No wheezes, rales, or rhonchi. . No tactile fremitus. Cardiovascular:  . Regular rate and rhythm. . No murmurs, ectopy, or gallups. . No lateral PMI. No thrills. Abdomen:  . Abdomen is soft, non-tender, non-distended. . No hernias, masses, or organomegaly . Normoactive bowel sounds.  Musculoskeletal:  . No cyanosis, clubbing, or edema. Skin:  . No rashes, lesions, ulcers . palpation of skin: no induration or nodules Neurologic:  . CN 2-12 intact . Sensation all 4 extremities intact Psychiatric:  . Mental status o Mood, affect appropriate o Orientation to person, place, time  . judgment and insight appear intact    I have personally reviewed the following:   Today's Data  . Vitals, CMP, CBC  Micro Data  . MRSA by PCR negative  Scheduled Meds: . diclofenac sodium  2 g Topical QID  . docusate sodium  200 mg Oral Daily  . enoxaparin (LOVENOX) injection  40 mg Subcutaneous Daily  . esomeprazole  20 mg Oral QAC breakfast  . feeding supplement (ENSURE ENLIVE)  237 mL Oral TID BM  . ferrous sulfate  325 mg Oral BID WC  . folic acid  1 mg Oral Daily  . methocarbamol  500 mg Oral TID  . metoprolol tartrate  12.5 mg Oral BID  . multivitamin with minerals  1 tablet Oral Daily  . polyethylene glycol  17 g Oral BID  . sodium chloride flush  10-40 mL Intracatheter Q12H  . thiamine  100 mg Oral Daily   Or  . thiamine  100 mg Intravenous Daily  . traMADol  50 mg Oral Q6H  . vitamin B-12  100 mcg Oral Daily   Continuous Infusions: . sodium chloride 10 mL/hr at 07/22/18 2302  . methocarbamol (ROBAXIN) IV    . vancomycin 750 mg (07/28/18 0125)    Principal Problem:   Septic  arthritis of multiple joints (HCC) Active Problems:   Hepatitis C antibody test positive   Cigarette smoker   Intractable back pain   AKI (acute kidney injury) (HCC)   Spinal stenosis of lumbosacral region   GERD (gastroesophageal reflux disease)   Mild protein-calorie malnutrition (HCC)   Septic arthritis of knee, left (HCC)   Acute medial meniscal tear, left, initial encounter   Polysubstance abuse (HCC)   Normocytic anemia   Effusion of left elbow   Recurrent boils   Discitis of lumbosacral region   Psoas abscess, right (HCC)   Multiple  abscesses of right shoulder   Chronic infection of left knee (HCC)   LOS: 38 days    A & P  Sepsis secondary to cellulitis of the right upper extremity multiple abscess large multiple lumbar abscess and septic arthritis of the left knee: Patient presented with tachycardia, leukocytosis and back pain. MRI 5/5 showed severe lumbar spinal stenosis. MRI 5/19 showed L4 S1 discitis, L4-L5 septic arthritis multiple paraspinal abscess. CT right shoulder showed intramuscular and infra facial abscess along with [facility]. Neurosurgery was consulted and Recommended repeat MRI near the end of antibiotics.  Antibiotics were started 06/23/2018.  He is on vancomycin.  This is for a duration of 6 weeks. Pain due to discitis and paraspinal abscess limit the patient's ability to participate with therapy. Will ask that pain meds are administered prior to PT to optimize his ability to participate.  ID consulted, recommend minimum of 6 weeks of IV antibiotics.Antibiotics started 06/23/2018. Orthopedic consulted. Patient is status post left knee arthrocentesis and washout. S/p irrigation and debridement of right shoulder, acromial medial clavicular joint irrigation and debridement on the right. Echocardiogram was negative for vegetation. PT consulted recommending SNF, however the patient has no insurance and a history of illicit drug use. The  suture left knee was removed. CPM machine ordered. Added low dose morphine prior patient using CPM machine. He is tolerating CPM machine well.   Vertigo: Resolved with Valium.  Constipation: Resolved. Will continue miralax and docusate sodium daily.  AKI: Creatinine on admission was 3.5. Normalized. Creatinine today is 0.70.  Normocytic anemia: Hgb stable in the 7.7 - 7.6 range for the last week.  7.4 today. In part this is due to iron deficiency anemia. Start ferrous sulfate and B12 supplement.  Advised him to follow up with PCP for colonoscopy.   Hyponatremia:Due to volume depletion. Resolved with IV fluids. Monitor.  Constipation:Continue with bowel regimen.  Polysubstance abuse: Admits to using cocaine prior to admission. Denies he is ever used IV heroin. UDS positive for benzodiazepines and cocaine.  Essential Hypertension with tachycardia: Improved on lopressor.Dose increased after PT noted tachycardia on ambulation.  I have seen and examined this patient myself. I have spent 32 minutes in his evaluation and care.  DVT prophylaxis:Lovenox Code Status:Full code Family Communication:None Disposition Plan:Stay in the hospital for IV antibiotics  Esley Brooking, DO [facility] Direct contact: see [email or web address]  7PM-7AM contact night coverage as above  07/28/2018, 2:47 PM  LOS: 35 days

## 2018-07-28 NOTE — Progress Notes (Signed)
Orthopedic Tech Progress Note Patient Details:  [name] 05-Apr-1965 [id number]  CPM Left Knee CPM Left Knee: On Left Knee Flexion (Degrees): 65 Left Knee Extension (Degrees): 6 Additional Comments: patient can not tolerate flexion  Post Interventions Patient Tolerated: Well Instructions Provided: Adjustment of device, Care of device  [name] 07/28/2018, 5:12 PM

## 2018-07-28 NOTE — Progress Notes (Signed)
Orthopedic Tech Progress Note Patient Details:  [name] September 15, 1965 [id number]  CPM Left Knee CPM Left Knee: Off Left Knee Flexion (Degrees): 65 Left Knee Extension (Degrees): 6 Additional Comments: patient can not tolerate flexion  Post Interventions Patient Tolerated: Well Instructions Provided: Adjustment of device, Care of device  [name] 07/28/2018, 7:00 PM

## 2018-07-29 LAB — VANCOMYCIN, TROUGH: Vancomycin Tr: 12 ug/mL — ABNORMAL LOW (ref 15–20)

## 2018-07-29 NOTE — Progress Notes (Signed)
Orthopedic Tech Progress Note Patient Details:  [name] Nov 24, 1965 [id number] Added some ice and a towel on the knee  CPM Left Knee CPM Left Knee: On Left Knee Flexion (Degrees): 70 Left Knee Extension (Degrees): 6 Additional Comments: patient can not tolerate flexion  Post Interventions Patient Tolerated: Well Instructions Provided: Care of [place] 07/29/2018, 6:05 PM

## 2018-07-29 NOTE — Plan of Care (Signed)
  Problem: Activity: Goal: Risk for activity intolerance will decrease Outcome: Progressing   

## 2018-07-29 NOTE — Progress Notes (Signed)
Pt's PICC line has no blood return. IV team notified. Pt's Vancomycin Peak needed to be drawn @ 1600. RN called lab to collect specimen since picc is not working. Pt refused  lab and wants to wait till PICC is working again. PT advised of the importance of the lab and still refusing.

## 2018-07-29 NOTE — Progress Notes (Signed)
PICC line not drawing blood. Cap changed, flushed several times, only 1cc of blood return. Has been TPAd in the past. PICC line placed 1 month ago. PICC nurse recommended CXR to verify placement and then TPA if in place. Primary nurse to page MD.

## 2018-07-29 NOTE — Progress Notes (Signed)
PROGRESS NOTE  Broughton Eppinger IZT:[id number] DOB: July 16, 1965 DOA: 06/19/2018 PCP: Patient, No Pcp Per  Brief History   53 year old with past medical history significant for hepatitis C, who presented to the ED with complaints of increasing back pain, radiating down to the left leg initially but subsequently both legs. Patient was having difficulty standing and walking. Per patient the pain started after he attempted to lift a lawnmower over a week ago. Evaluation in the ED, MRI showed severe foraminal narrowing at level L5-S1.   He subsequently was admitted with sepsis secondary to right upper extremity cellulitis and was started on IV antibiotics, however cellulitis did not improve. He did havea CT scan which showed intra-facial abscess in the right shoulder along with septic arthritis of the [facility] joint. Patient had debridement of multiple abscess. During hospitalization patient began to have back pain and right thigh weakness. MRI of the lumbar spine which showed discitis as well as septic arthritis and paraspinal abscess. During hospitalization he did have left knee swelling, he had arthrocentesis which showed gram-positive cocci and had left knee washout.  Patient was evaluated by ID who is recommending 6 to 8 weeks of IV antibiotics.  Vancomycin is started 06/23/2018.  Patient in the hospital to finish IV antibiotics. His participation with PT has been limited by pain in his back.  Consultants  . Infectious disease . Orthopedic surgery . Neurosurgery  Procedures  . Arthrocentesis  Antibiotics   Anti-infectives (From admission, onward)   Start     Dose/Rate Route Frequency Ordered Stop   06/29/18 1400  vancomycin (VANCOCIN) IVPB 750 mg/150 ml premix     750 mg 150 mL/hr over 60 Minutes Intravenous Every 12 hours 06/29/18 1342     06/29/18 0000  vancomycin IVPB     750 mg Intravenous Every 12 hours 06/29/18 1407 08/09/18 2359   06/28/18 1735  gentamicin (GARAMYCIN) injection   Status:  Discontinued       As needed 06/28/18 1736 06/28/18 1838   06/28/18 1734  vancomycin (VANCOCIN) powder  Status:  Discontinued       As needed 06/28/18 1735 06/28/18 1838   06/26/18 1300  vancomycin (VANCOCIN) 1,250 mg in sodium chloride 0.9 % 250 mL IVPB  Status:  Discontinued     1,250 mg 166.7 mL/hr over 90 Minutes Intravenous Every 12 hours 06/26/18 1057 06/26/18 1136   06/26/18 1300  vancomycin (VANCOCIN) IVPB 1000 mg/200 mL premix  Status:  Discontinued     1,000 mg 200 mL/hr over 60 Minutes Intravenous Every 12 hours 06/26/18 1136 06/29/18 1342   06/26/18 0000  vancomycin IVPB  Status:  Discontinued     1,000 mg Intravenous Every 12 hours 06/26/18 1616 06/29/18    06/24/18 1200  vancomycin (VANCOCIN) 1,500 mg in sodium chloride 0.9 % 500 mL IVPB  Status:  Discontinued     1,500 mg 250 mL/hr over 120 Minutes Intravenous Every 24 hours 06/23/18 1040 06/26/18 1057   06/24/18 1000  cefTRIAXone (ROCEPHIN) 2 g in sodium chloride 0.9 % 100 mL IVPB  Status:  Discontinued     2 g 200 mL/hr over 30 Minutes Intravenous Every 24 hours 06/23/18 0857 06/24/18 1433   06/23/18 1848  vancomycin (VANCOCIN) powder  Status:  Discontinued       As needed 06/23/18 1848 06/23/18 1956   06/23/18 1847  gentamicin (GARAMYCIN) injection  Status:  Discontinued       As needed 06/23/18 1847 06/23/18 1956   06/23/18 0900  vancomycin (VANCOCIN)  1,500 mg in sodium chloride 0.9 % 500 mL IVPB     1,500 mg 250 mL/hr over 120 Minutes Intravenous  Once 06/23/18 0855 06/24/18 0814   06/23/18 0900  cefTRIAXone (ROCEPHIN) 1 g in sodium chloride 0.9 % 100 mL IVPB     1 g 200 mL/hr over 30 Minutes Intravenous  Once 06/23/18 0857 06/24/18 0814   06/20/18 0900  cefTRIAXone (ROCEPHIN) 1 g in sodium chloride 0.9 % 100 mL IVPB  Status:  Discontinued     1 g 200 mL/hr over 30 Minutes Intravenous Every 24 hours 06/20/18 0824 06/23/18 0857      Subjective  The patient is resting comfortably. No new complaints.   Objective   Vitals:  Vitals:   07/29/18 0912 07/29/18 1100  BP: 127/78 121/76  Pulse: (!) 106 100  Resp:    Temp:  98.3 F (36.8 C)  SpO2:      Exam:  Constitutional:  Awake,alert, and oriented x 3. No acute distress. Respiratory:  . No increased work of breathing. . No wheezes, rales, or rhonchi. . No tactile fremitus. Cardiovascular:  . Regular rate and rhythm. . No murmurs, ectopy, or gallups. . No lateral PMI. No thrills. Abdomen:  . Abdomen is soft, non-tender, non-distended. . No hernias, masses, or organomegaly . Normoactive bowel sounds.  Musculoskeletal:  . No cyanosis, clubbing, or edema. Skin:  . No rashes, lesions, ulcers . palpation of skin: no induration or nodules Neurologic:  . CN 2-12 intact . Sensation all 4 extremities intact Psychiatric:  . Mental status o Mood, affect appropriate o Orientation to person, place, time  . judgment and insight appear intact    I have personally reviewed the following:   Today's Data  . Vitals, CMP, CBC  Micro Data  . MRSA by PCR negative  Scheduled Meds: . diclofenac sodium  2 g Topical QID  . docusate sodium  200 mg Oral Daily  . enoxaparin (LOVENOX) injection  40 mg Subcutaneous Daily  . esomeprazole  20 mg Oral QAC breakfast  . feeding supplement (ENSURE ENLIVE)  237 mL Oral TID BM  . ferrous sulfate  325 mg Oral BID WC  . folic acid  1 mg Oral Daily  . methocarbamol  500 mg Oral TID  . metoprolol tartrate  12.5 mg Oral BID  . multivitamin with minerals  1 tablet Oral Daily  . polyethylene glycol  17 g Oral BID  . sodium chloride flush  10-40 mL Intracatheter Q12H  . thiamine  100 mg Oral Daily   Or  . thiamine  100 mg Intravenous Daily  . traMADol  50 mg Oral Q6H  . vitamin B-12  100 mcg Oral Daily   Continuous Infusions: . sodium chloride 10 mL/hr at 07/22/18 2302  . methocarbamol (ROBAXIN) IV    . vancomycin 750 mg (07/29/18 0105)    Principal Problem:   Septic arthritis of multiple  joints (HCC) Active Problems:   Hepatitis C antibody test positive   Cigarette smoker   Intractable back pain   AKI (acute kidney injury) ([place])   Spinal stenosis of lumbosacral region   GERD (gastroesophageal reflux disease)   Mild protein-calorie malnutrition (HCC)   Septic arthritis of knee, left (HCC)   Acute medial meniscal tear, left, initial encounter   Polysubstance abuse (HCC)   Normocytic anemia   Effusion of left elbow   Recurrent boils   Discitis of lumbosacral region   Psoas abscess, right (HCC)   Multiple abscesses of  right shoulder   Chronic infection of left knee (HCC)   LOS: 39 days    A & P  Sepsis secondary to cellulitis of the right upper extremity multiple abscess large multiple lumbar abscess and septic arthritis of the left knee: Patient presented with tachycardia, leukocytosis and back pain. MRI 5/5 showed severe lumbar spinal stenosis. MRI 5/19 showed L4 S1 discitis, L4-L5 septic arthritis multiple paraspinal abscess. CT right shoulder showed intramuscular and infra facial abscess along with [facility]. Neurosurgery was consulted and Recommended repeat MRI near the end of antibiotics.  Antibiotics were started 06/23/2018.  He is on vancomycin.  This is for a duration of 6 weeks. Pain due to discitis and paraspinal abscess limit the patient's ability to participate with therapy. Will ask that pain meds are administered prior to PT to optimize his ability to participate.  ID consulted, recommend minimum of 6 weeks of IV antibiotics.Antibiotics started 06/23/2018. Orthopedic consulted. Patient is status post left knee arthrocentesis and washout. S/p irrigation and debridement of right shoulder, acromial medial clavicular joint irrigation and debridement on the right. Echocardiogram was negative for vegetation. PT consulted recommending SNF, however the patient has no insurance and a history of illicit drug use. The suture left knee was  removed. CPM machine ordered. Added low dose morphine prior patient using CPM machine. He is tolerating CPM machine well.   Vertigo: Resolved with Valium.  Constipation: Resolved. Will continue miralax and docusate sodium daily.  AKI: Creatinine on admission was 3.5. Normalized. Creatinine today is 0.70.  Normocytic anemia: Hgb stable in the 7.7 - 7.6 range for the last week.  7.4 today. In part this is due to iron deficiency anemia. Start ferrous sulfate and B12 supplement.  Advised him to follow up with PCP for colonoscopy.   Hyponatremia:Due to volume depletion. Resolved with IV fluids. Monitor.  Constipation:Continue with bowel regimen.  Polysubstance abuse: Admits to using cocaine prior to admission. Denies he is ever used IV heroin. UDS positive for benzodiazepines and cocaine.  Essential Hypertension with tachycardia: Improved on lopressor.Dose increased after PT noted tachycardia on ambulation.  I have seen and examined this patient myself. I have spent 30 minutes in his evaluation and care.  DVT prophylaxis:Lovenox Code Status:Full code Family Communication:None Disposition Plan:Stay in the hospital for IV antibiotics  Ziasia Lenoir, DO [facility] Direct contact: see [email or web address]  7PM-7AM contact night coverage as above  07/29/2018, 1:22 PM  LOS: 35 days

## 2018-07-30 ENCOUNTER — Inpatient Hospital Stay (HOSPITAL_COMMUNITY): Payer: Medicaid Other

## 2018-07-30 LAB — VANCOMYCIN, PEAK: Vancomycin Pk: 26 ug/mL — ABNORMAL LOW (ref 30–40)

## 2018-07-30 LAB — VANCOMYCIN, TROUGH: Vancomycin Tr: 11 ug/mL — ABNORMAL LOW (ref 15–20)

## 2018-07-30 MED ORDER — ALTEPLASE 2 MG IJ SOLR: 2.0000 mg | Freq: Once | INTRAMUSCULAR | Status: DC | PRN

## 2018-07-30 NOTE — Progress Notes (Signed)
Pharmacy Antibiotic Note  [name] is a 53 y.o. male admitted on 06/19/2018 with back pain, now with septic arthritis of multiple joints. Pharmacy has been consulted for vancomycin dosing. Patient is s/p I&D of R shoulder and the [facility] joint on 5/20.  Vanc peak today 26, vanc trough 11 - AUC 452. Afebrile. Scr 0.62- stable. AUC within goal range.   Planning for 6 weeks of ABX per ID. OPAT has been entered, no changes for now.   Plan: -Continue vancomycin 750mg  IV q12h -Monitor renal function, vancomycin levels as needed  Height: 5\' 10"  (177.8 cm) Weight: 165 lb (74.8 kg) IBW/kg (Calculated) : 73  Temp (24hrs), Avg:98.1 F (36.7 C), Min:97.8 F (36.6 C), Max:98.6 F (37 C)  Recent Labs  Lab 07/25/18 0145 07/26/18 0459 07/27/18 0412 07/27/18 2346 07/28/18 0500 07/29/18 1341 07/30/18 0420 07/30/18 1436  WBC 13.1* 13.4* 13.2* 13.8*  --   --   --   --   CREATININE 0.70  --  0.69  --  0.62  --   --   --   VANCOTROUGH  --   --   --   --   --  12*  --  11*  VANCOPEAK  --   --   --   --   --   --  26*  --     Estimated Creatinine Clearance: 110.3 mL/min (by C-G formula based on SCr of 0.62 mg/dL).    No Known Allergies   Vanc 5/15 >> CTX 5/12 >> 5/16  5/11 covid - negative 5/12 BCx - negative 5/14 synovial fluid L knee - GPC on stain >> negative 5/15 synovial fluid L knee - GPC on stain >> negative 5/20 BCx: neg 5/20 posterior shoulder abcess - neg 5/20 AC fluid joint - neg  Antonietta Jewel, PharmD, BCCCP Clinical Pharmacist  Pager: [phone] Phone: [phone] Please check AMION for all [place] numbers

## 2018-07-30 NOTE — Progress Notes (Signed)
PROGRESS NOTE  [name] IZT:[id number] DOB: July 16, 1965 DOA: 06/19/2018 PCP: Patient, No Pcp Per  Brief History   52 year old with past medical history significant for hepatitis C, who presented to the ED with complaints of increasing back pain, radiating down to the left leg initially but subsequently both legs. Patient was having difficulty standing and walking. Per patient the pain started after he attempted to lift a lawnmower over a week ago. Evaluation in the ED, MRI showed severe foraminal narrowing at level L5-S1.   He subsequently was admitted with sepsis secondary to right upper extremity cellulitis and was started on IV antibiotics, however cellulitis did not improve. He did havea CT scan which showed intra-facial abscess in the right shoulder along with septic arthritis of the [facility] joint. Patient had debridement of multiple abscess. During hospitalization patient began to have back pain and right thigh weakness. MRI of the lumbar spine which showed discitis as well as septic arthritis and paraspinal abscess. During hospitalization he did have left knee swelling, he had arthrocentesis which showed gram-positive cocci and had left knee washout.  Patient was evaluated by ID who is recommending 6 to 8 weeks of IV antibiotics.  Vancomycin is started 06/23/2018.  Patient in the hospital to finish IV antibiotics. His participation with PT has been limited by pain in his back.  Consultants  . Infectious disease . Orthopedic surgery . Neurosurgery  Procedures  . Arthrocentesis  Antibiotics   Anti-infectives (From admission, onward)   Start     Dose/Rate Route Frequency Ordered Stop   06/29/18 1400  vancomycin (VANCOCIN) IVPB 750 mg/150 ml premix     750 mg 150 mL/hr over 60 Minutes Intravenous Every 12 hours 06/29/18 1342     06/29/18 0000  vancomycin IVPB     750 mg Intravenous Every 12 hours 06/29/18 1407 08/09/18 2359   06/28/18 1735  gentamicin (GARAMYCIN) injection   Status:  Discontinued       As needed 06/28/18 1736 06/28/18 1838   06/28/18 1734  vancomycin (VANCOCIN) powder  Status:  Discontinued       As needed 06/28/18 1735 06/28/18 1838   06/26/18 1300  vancomycin (VANCOCIN) 1,250 mg in sodium chloride 0.9 % 250 mL IVPB  Status:  Discontinued     1,250 mg 166.7 mL/hr over 90 Minutes Intravenous Every 12 hours 06/26/18 1057 06/26/18 1136   06/26/18 1300  vancomycin (VANCOCIN) IVPB 1000 mg/200 mL premix  Status:  Discontinued     1,000 mg 200 mL/hr over 60 Minutes Intravenous Every 12 hours 06/26/18 1136 06/29/18 1342   06/26/18 0000  vancomycin IVPB  Status:  Discontinued     1,000 mg Intravenous Every 12 hours 06/26/18 1616 06/29/18    06/24/18 1200  vancomycin (VANCOCIN) 1,500 mg in sodium chloride 0.9 % 500 mL IVPB  Status:  Discontinued     1,500 mg 250 mL/hr over 120 Minutes Intravenous Every 24 hours 06/23/18 1040 06/26/18 1057   06/24/18 1000  cefTRIAXone (ROCEPHIN) 2 g in sodium chloride 0.9 % 100 mL IVPB  Status:  Discontinued     2 g 200 mL/hr over 30 Minutes Intravenous Every 24 hours 06/23/18 0857 06/24/18 1433   06/23/18 1848  vancomycin (VANCOCIN) powder  Status:  Discontinued       As needed 06/23/18 1848 06/23/18 1956   06/23/18 1847  gentamicin (GARAMYCIN) injection  Status:  Discontinued       As needed 06/23/18 1847 06/23/18 1956   06/23/18 0900  vancomycin (VANCOCIN)  1,500 mg in sodium chloride 0.9 % 500 mL IVPB     1,500 mg 250 mL/hr over 120 Minutes Intravenous  Once 06/23/18 0855 06/24/18 0814   06/23/18 0900  cefTRIAXone (ROCEPHIN) 1 g in sodium chloride 0.9 % 100 mL IVPB     1 g 200 mL/hr over 30 Minutes Intravenous  Once 06/23/18 0857 06/24/18 0814   06/20/18 0900  cefTRIAXone (ROCEPHIN) 1 g in sodium chloride 0.9 % 100 mL IVPB  Status:  Discontinued     1 g 200 mL/hr over 30 Minutes Intravenous Every 24 hours 06/20/18 0824 06/23/18 0857      Subjective  The patient is resting comfortably. No new complaints.   Objective   Vitals:  Vitals:   07/30/18 0843 07/30/18 1214  BP: 119/73 121/74  Pulse: (!) 105 99  Resp: 18 18  Temp: 97.8 F (36.6 C) 97.9 F (36.6 C)  SpO2: 97% 98%    Exam:  Constitutional:  Somnolent this morning, but arousable. No acute distress. Respiratory:  . No increased work of breathing. . No wheezes, rales, or rhonchi. . No tactile fremitus. Cardiovascular:  . Regular rate and rhythm. . No murmurs, ectopy, or gallups. . No lateral PMI. No thrills. Abdomen:  . Abdomen is soft, non-tender, non-distended. . No hernias, masses, or organomegaly . Normoactive bowel sounds.  Musculoskeletal:  . No cyanosis, clubbing, or edema. Skin:  . No rashes, lesions, ulcers . palpation of skin: no induration or nodules Neurologic:  . CN 2-12 intact . Sensation all 4 extremities intact Psychiatric:  . Mental status o Mood, affect appropriate o Orientation to person, place, time  . judgment and insight appear intact    I have personally reviewed the following:   Today's Data  . Vitals, CMP, CBC  Micro Data  . MRSA by PCR negative  Scheduled Meds: . diclofenac sodium  2 g Topical QID  . docusate sodium  200 mg Oral Daily  . enoxaparin (LOVENOX) injection  40 mg Subcutaneous Daily  . esomeprazole  20 mg Oral QAC breakfast  . feeding supplement (ENSURE ENLIVE)  237 mL Oral TID BM  . ferrous sulfate  325 mg Oral BID WC  . folic acid  1 mg Oral Daily  . methocarbamol  500 mg Oral TID  . metoprolol tartrate  12.5 mg Oral BID  . multivitamin with minerals  1 tablet Oral Daily  . polyethylene glycol  17 g Oral BID  . sodium chloride flush  10-40 mL Intracatheter Q12H  . thiamine  100 mg Oral Daily   Or  . thiamine  100 mg Intravenous Daily  . traMADol  50 mg Oral Q6H  . vitamin B-12  100 mcg Oral Daily   Continuous Infusions: . sodium chloride 10 mL/hr at 07/22/18 2302  . methocarbamol (ROBAXIN) IV    . vancomycin 750 mg (07/30/18 1418)    Principal  Problem:   Septic arthritis of multiple joints (HCC) Active Problems:   Hepatitis C antibody test positive   Cigarette smoker   Intractable back pain   AKI (acute kidney injury) (HCC)   Spinal stenosis of lumbosacral region   GERD (gastroesophageal reflux disease)   Mild protein-calorie malnutrition (HCC)   Septic arthritis of knee, left (HCC)   Acute medial meniscal tear, left, initial encounter   Polysubstance abuse (HCC)   Normocytic anemia   Effusion of left elbow   Recurrent boils   Discitis of lumbosacral region   Psoas abscess, right (HCC)  Multiple abscesses of right shoulder   Chronic infection of left knee (HCC)   LOS: 40 days    A & P  Sepsis secondary to cellulitis of the right upper extremity multiple abscess large multiple lumbar abscess and septic arthritis of the left knee: Patient presented with tachycardia, leukocytosis and back pain. MRI 5/5 showed severe lumbar spinal stenosis. MRI 5/19 showed L4 S1 discitis, L4-L5 septic arthritis multiple paraspinal abscess. CT right shoulder showed intramuscular and infra facial abscess along with septic arthritis of the [facility] joint. Neurosurgery was consulted and Recommended repeat MRI near the end of antibiotics.  Antibiotics were started 06/23/2018.  He is on vancomycin.  This is for a duration of 6 weeks. Pain due to discitis and paraspinal abscess limit the patient's ability to participate with therapy. Will ask that pain meds are administered prior to PT to optimize his ability to participate.  ID consulted, recommend minimum of 6 weeks of IV antibiotics.Antibiotics started 06/23/2018. Orthopedic consulted. Patient is status post left knee arthrocentesis and washout. S/p irrigation and debridement of right shoulder, acromial medial clavicular joint irrigation and debridement on the right. Echocardiogram was negative for vegetation. PT consulted recommending SNF, however the patient has no insurance and a history of illicit  drug use. The suture left knee was removed. CPM machine ordered. Added low dose morphine prior patient using CPM machine. He is tolerating CPM machine well.   Vertigo: Resolved with Valium.  Constipation: Resolved. Will continue miralax and docusate sodium daily.  AKI: Creatinine on admission was 3.5. Normalized. Creatinine today is 0.70.  Normocytic anemia: Hgb stable in the 7.7 - 7.6 range for the last week.  7.4 today. In part this is due to iron deficiency anemia. Start ferrous sulfate and B12 supplement.  Advised him to follow up with PCP for colonoscopy.   Hyponatremia:Due to volume depletion. Resolved with IV fluids. Monitor.  Constipation:Continue with bowel regimen.  Polysubstance abuse: Admits to using cocaine prior to admission. Denies he is ever used IV heroin. UDS positive for benzodiazepines and cocaine.  Essential Hypertension with tachycardia: Improved on lopressor.Dose increased after PT noted tachycardia on ambulation.  I have seen and examined this patient myself. I have spent 32 minutes in his evaluation and care.  DVT prophylaxis:Lovenox Code Status:Full code Family Communication:None Disposition Plan:Stay in the hospital for IV antibiotics  Taryn Nave, DO [facility] Direct contact: see [email or web address]  7PM-7AM contact night coverage as above  07/29/2018, 1:22 PM  LOS: 35 days

## 2018-07-31 LAB — TROPONIN I
Troponin I: <0.03 ng/mL (ref <0.03)
Troponin I: <0.03 ng/mL (ref <0.03)

## 2018-07-31 LAB — BASIC METABOLIC PANEL
Anion gap: 10 (ref 5–15)
BUN: 16 mg/dL (ref 6–20)
CO2: 29 mmol/L (ref 22–32)
Calcium: 9 mg/dL (ref 8.9–10.3)
Chloride: 93 mmol/L — ABNORMAL LOW (ref 98–111)
Creatinine, Ser: 0.68 mg/dL (ref 0.61–1.24)
GFR calc Af Amer: >60 mL/min (ref >60)
GFR calc non Af Amer: >60 mL/min (ref >60)
Glucose, Bld: 96 mg/dL (ref 70–99)
Potassium: 3.8 mmol/L (ref 3.5–5.1)
Sodium: 132 mmol/L — ABNORMAL LOW (ref 135–145)

## 2018-07-31 LAB — D-DIMER, QUANTITATIVE: D-Dimer, Quant: 4.77 ug/mL-FEU — ABNORMAL HIGH (ref 0.00–0.50)

## 2018-07-31 NOTE — Progress Notes (Signed)
PROGRESS NOTE  [name] FTD:[id number] DOB: May 07, 1965 DOA: 06/19/2018 PCP: Patient, No Pcp Per  Brief History   53 year old with past medical history significant for hepatitis C, who presented to the ED with complaints of increasing back pain, radiating down to the left leg initially but subsequently both legs. Patient was having difficulty standing and walking. Per patient the pain started after he attempted to lift a lawnmower over a week ago. Evaluation in the ED, MRI showed severe foraminal narrowing at level L5-S1.   He subsequently was admitted with sepsis secondary to right upper extremity cellulitis and was started on IV antibiotics, however cellulitis did not improve. He did havea CT scan which showed intra-facial abscess in the right shoulder along with septic arthritis of the [facility] joint. Patient had debridement of multiple abscess. During hospitalization patient began to have back pain and right thigh weakness. MRI of the lumbar spine which showed discitis as well as septic arthritis and paraspinal abscess. During hospitalization he did have left knee swelling, he had arthrocentesis which showed gram-positive cocci and had left knee washout.  Patient was evaluated by ID who is recommending 6 to 8 weeks of IV antibiotics.  Vancomycin is started 06/23/2018.  Patient in the hospital to finish IV antibiotics. His participation with PT has been limited by pain in his back.  Consultants  . Infectious disease . Orthopedic surgery . Neurosurgery  Procedures  . Arthrocentesis  Antibiotics   Anti-infectives (From admission, onward)   Start     Dose/Rate Route Frequency Ordered Stop   06/29/18 1400  vancomycin (VANCOCIN) IVPB 750 mg/150 ml premix     750 mg 150 mL/hr over 60 Minutes Intravenous Every 12 hours 06/29/18 1342     06/29/18 0000  vancomycin IVPB     750 mg Intravenous Every 12 hours 06/29/18 1407 08/09/18 2359   06/28/18 1735  gentamicin (GARAMYCIN) injection   Status:  Discontinued       As needed 06/28/18 1736 06/28/18 1838   06/28/18 1734  vancomycin (VANCOCIN) powder  Status:  Discontinued       As needed 06/28/18 1735 06/28/18 1838   06/26/18 1300  vancomycin (VANCOCIN) 1,250 mg in sodium chloride 0.9 % 250 mL IVPB  Status:  Discontinued     1,250 mg 166.7 mL/hr over 90 Minutes Intravenous Every 12 hours 06/26/18 1057 06/26/18 1136   06/26/18 1300  vancomycin (VANCOCIN) IVPB 1000 mg/200 mL premix  Status:  Discontinued     1,000 mg 200 mL/hr over 60 Minutes Intravenous Every 12 hours 06/26/18 1136 06/29/18 1342   06/26/18 0000  vancomycin IVPB  Status:  Discontinued     1,000 mg Intravenous Every 12 hours 06/26/18 1616 06/29/18    06/24/18 1200  vancomycin (VANCOCIN) 1,500 mg in sodium chloride 0.9 % 500 mL IVPB  Status:  Discontinued     1,500 mg 250 mL/hr over 120 Minutes Intravenous Every 24 hours 06/23/18 1040 06/26/18 1057   06/24/18 1000  cefTRIAXone (ROCEPHIN) 2 g in sodium chloride 0.9 % 100 mL IVPB  Status:  Discontinued     2 g 200 mL/hr over 30 Minutes Intravenous Every 24 hours 06/23/18 0857 06/24/18 1433   06/23/18 1848  vancomycin (VANCOCIN) powder  Status:  Discontinued       As needed 06/23/18 1848 06/23/18 1956   06/23/18 1847  gentamicin (GARAMYCIN) injection  Status:  Discontinued       As needed 06/23/18 1847 06/23/18 1956   06/23/18 0900  vancomycin (VANCOCIN)  1,500 mg in sodium chloride 0.9 % 500 mL IVPB     1,500 mg 250 mL/hr over 120 Minutes Intravenous  Once 06/23/18 0855 06/24/18 0814   06/23/18 0900  cefTRIAXone (ROCEPHIN) 1 g in sodium chloride 0.9 % 100 mL IVPB     1 g 200 mL/hr over 30 Minutes Intravenous  Once 06/23/18 0857 06/24/18 0814   06/20/18 0900  cefTRIAXone (ROCEPHIN) 1 g in sodium chloride 0.9 % 100 mL IVPB  Status:  Discontinued     1 g 200 mL/hr over 30 Minutes Intravenous Every 24 hours 06/20/18 0824 06/23/18 0857      Subjective  Patient seen and examined.  He complains of mild right hip  pain but otherwise he states that he is feeling a lot better and he is appreciative of the care he is receiving.  Objective   Vitals:  Vitals:   07/31/18 0007 07/31/18 0411  BP: 124/71 118/69  Pulse: (!) 114 (!) 114  Resp: 18 18  Temp: 98.4 F (36.9 C) 97.9 F (36.6 C)  SpO2: 98% 96%    Exam:  General exam: Appears calm and comfortable  Respiratory system: Clear to auscultation. Respiratory effort normal. Cardiovascular system: S1 & S2 heard, RRR. No JVD, murmurs, rubs, gallops or clicks. No pedal edema. Gastrointestinal system: Abdomen is nondistended, soft and nontender. No organomegaly or masses felt. Normal bowel sounds heard. Central nervous system: Alert and oriented. No focal neurological deficits. Extremities: Symmetric 5 x 5 power.  Slightly swollen left knee compared to the right but no effusion. Skin: No rashes, lesions or ulcers Psychiatry: Judgement and insight appear normal. Mood & affect appropriate.   I have personally reviewed the following:   Today's Data  . Vitals, CMP, CBC  Micro Data  . MRSA by PCR negative  Scheduled Meds: . diclofenac sodium  2 g Topical QID  . docusate sodium  200 mg Oral Daily  . enoxaparin (LOVENOX) injection  40 mg Subcutaneous Daily  . esomeprazole  20 mg Oral QAC breakfast  . feeding supplement (ENSURE ENLIVE)  237 mL Oral TID BM  . ferrous sulfate  325 mg Oral BID WC  . folic acid  1 mg Oral Daily  . methocarbamol  500 mg Oral TID  . metoprolol tartrate  12.5 mg Oral BID  . multivitamin with minerals  1 tablet Oral Daily  . polyethylene glycol  17 g Oral BID  . sodium chloride flush  10-40 mL Intracatheter Q12H  . thiamine  100 mg Oral Daily   Or  . thiamine  100 mg Intravenous Daily  . traMADol  50 mg Oral Q6H  . vitamin B-12  100 mcg Oral Daily   Continuous Infusions: . sodium chloride 10 mL/hr at 07/22/18 2302  . methocarbamol (ROBAXIN) IV    . vancomycin 750 mg (07/31/18 0240)    Principal Problem:    Septic arthritis of multiple joints (HCC) Active Problems:   Hepatitis C antibody test positive   Cigarette smoker   Intractable back pain   AKI (acute kidney injury) (HCC)   Spinal stenosis of lumbosacral region   GERD (gastroesophageal reflux disease)   Mild protein-calorie malnutrition (HCC)   Septic arthritis of knee, left (HCC)   Acute medial meniscal tear, left, initial encounter   Polysubstance abuse (HCC)   Normocytic anemia   Effusion of left elbow   Recurrent boils   Discitis of lumbosacral region   Psoas abscess, right (HCC)   Multiple abscesses of right shoulder  Chronic infection of left knee (HCC)   LOS: 41 days    A & P  Sepsis secondary to cellulitis of the right upper extremity multiple abscess large multiple lumbar abscess and septic arthritis of the left knee: Patient presented with tachycardia, leukocytosis and back pain. MRI 5/5 showed severe lumbar spinal stenosis. MRI 5/19 showed L4 S1 discitis, L4-L5 septic arthritis multiple paraspinal abscess. CT right shoulder showed intramuscular and infra facial abscess along with septic arthritis of the [facility] joint. Neurosurgery was consulted and Recommended repeat MRI near the end of antibiotics.  Antibiotics were started 06/23/2018.  He is on vancomycin.  This is for a duration of 6 weeks. Pain due to discitis and paraspinal abscess limit the patient's ability to participate with therapy however he is getting better every day.  ID consulted, recommend minimum of 6 weeks of IV antibiotics.Antibiotics started 06/23/2018.  Last blood culture negative starting 06/28/2018. Orthopedic consulted. Patient is status post left knee arthrocentesis and washout. S/p irrigation and debridement of right shoulder, acromial medial clavicular joint irrigation and debridement on the right. Echocardiogram was negative for vegetation. PT consulted recommending SNF, however the patient has no insurance and a history of illicit drug use. The  suture left knee was removed. CPM machine ordered. Added low dose morphine prior patient using CPM machine. He is tolerating CPM machine well.   Vertigo: Resolved.  AKI: Came in with creatinine of 3.5.  Now normal.  Normocytic anemia: Hemoglobin remains stable between 7 and 8.  Will recheck in the morning.  Will need outpatient colonoscopy.  Polysubstance abuse: Admits to using cocaine prior to admission.  Denies using IV heroin.  UDS positive for benzodiazepines and cocaine.  Essential hypertension with tachycardia: Improved on Lopressor.  Total time spent: 29 minutes  DVT prophylaxis:Lovenox Code Status:Full code Family Communication:None Disposition Plan:Stay in the hospital for IV antibiotics  Renne Crigleravi N Hollan Philipp, MD [facility] Direct contact: see [email or web address]  7PM-7AM contact night coverage as above

## 2018-07-31 NOTE — Progress Notes (Signed)
Orthopedic Tech Progress Note Patient Details:  [name] 1965-06-16 [id number]  CPM Left Knee CPM Left Knee: On Left Knee Flexion (Degrees): 70 Left Knee Extension (Degrees): 6 Additional Comments: patient can not tolerate flexion  Post Interventions Patient Tolerated: Well Instructions Provided: Care of device  [name] 07/31/2018, 11:13 AM

## 2018-07-31 NOTE — Progress Notes (Addendum)
Occupational Therapy Treatment Patient Details Name: [name] MRN: [id number] DOB: 03-18-1965 Today's Date: 07/31/2018    History of present illness Pt. with PMH of hep C, substance abuse; admitted on 06/19/2018, presented with complaint of back pain, was found to have musculoskeletal back pain as well as right upper extremity cellulitis; Patient had a debridement of his shoulder perfromed on 5/20.    OT comments  Pt with slow progress towards OT goals, with limitations today due to dizziness with standing (BP monitored, see below). Pt requiring two person assist for safe sit<>stand, completing x2 during session. Pt performing ROM to R shoulder within permissible limits with good tolerance throughout. He continues to require max-totalA for LB ADL due to painful LLE. Feel POC remains appropriate at this time. Will continue to follow acutely.   BP after first sit<>stand seated EOB: 95/77 In standing: 91/70 After return to sitting: 92/59   Follow Up Recommendations  SNF;Supervision/Assistance - 24 hour    Equipment Recommendations  Wheelchair (measurements OT)(if d/c and unable to ambulate)          Precautions / Restrictions Precautions Precautions: Shoulder Type of Shoulder Precautions: Active protocol: PROM/AROM shoulder FF to 90, abduct to 60, ER to 30; verbal order per Dr. Marlou Sa on 5/25 AROM elbow/wrist/hand as tolerated  Restrictions Weight Bearing Restrictions: Yes RUE Weight Bearing: Weight bearing as tolerated LLE Weight Bearing: Weight bearing as tolerated       Mobility Bed Mobility Overal bed mobility: Needs Assistance Bed Mobility: Supine to Sit;Sit to Supine     Supine to sit: Min assist;+2 for physical assistance;+2 for safety/equipment Sit to supine: Mod assist;+2 for physical assistance;+2 for safety/equipment   General bed mobility comments: pt requiring less assist to transition to EOB today, able to bring LEs over EOB with assist for trunk and to scoot  hips; increased assist to guide trunk/LEs onto EOB when returning to supine due to dizziness  Transfers Overall transfer level: Needs assistance Equipment used: Rolling walker (2 wheeled) Transfers: Sit to/from Stand Sit to Stand: Mod assist;+2 physical assistance;+2 safety/equipment         General transfer comment: pt stood x2 from EOB, requires boosting and steadying assist at [facility]; pt reports increased dizziness with standing requiring return to sitting     Balance Overall balance assessment: Needs assistance Sitting-balance support: No upper extremity supported;Feet supported Sitting balance-Leahy Scale: Fair     Standing balance support: Bilateral upper extremity supported;During functional activity Standing balance-Leahy Scale: Poor Standing balance comment: heavily reliant on external support                           ADL either performed or assessed with clinical judgement   ADL Overall ADL's : Needs assistance/impaired                     Lower Body Dressing: Total assistance;Bed level Lower Body Dressing Details (indicate cue type and reason): assist to don socks bed level today             Functional mobility during ADLs: Minimal assistance;Moderate assistance;+2 for physical assistance;+2 for safety/equipment;Rolling walker General ADL Comments: pt limited due to dizziness today moreso than pain                       Cognition Arousal/Alertness: Awake/alert Behavior During Therapy: WFL for tasks assessed/performed Overall Cognitive Status: Within Functional Limits for tasks assessed  General Comments: Self-limiting        Exercises General Exercises - Upper Extremity Shoulder Flexion: AROM;Right;10 reps(0-90*) Shoulder ABduction: AROM;Right;5 reps(0-60*) Shoulder Exercises Shoulder External Rotation: AROM;Right;5 reps(0-neutral)   Shoulder Instructions       General Comments       Pertinent Vitals/ Pain       Pain Assessment: Faces Faces Pain Scale: Hurts even more Pain Location: LLE, L shoulder when returning to supine Pain Descriptors / Indicators: Sore;Grimacing;Guarding;Moaning;Crying Pain Intervention(s): Monitored during session;Limited activity within patient's tolerance;Premedicated before session  Home Living                                          Prior Functioning/Environment              Frequency  Min 3X/week        Progress Toward Goals  OT Goals(current goals can now be found in the care plan section)  Progress towards OT goals: Progressing toward goals  Acute Rehab OT Goals Patient Stated Goal: "Be able to walk so I can go home" OT Goal Formulation: With patient Time For Goal Achievement: 08/10/18 Potential to Achieve Goals: Good ADL Goals Pt Will Perform Grooming: with modified independence;sitting Pt Will Perform Upper Body Dressing: with modified independence;sitting Pt Will Perform Lower Body Dressing: with modified independence;sitting/lateral leans;sit to/from stand Pt Will Transfer to Toilet: with min guard assist;ambulating;bedside commode Pt/caregiver will Perform Home Exercise Program: Both right and left upper extremity;With Supervision;With written HEP provided Additional ADL Goal #1: Pt will sit EOB for ADL tasks with set-upA and fair sitting balance  Plan Discharge plan remains appropriate    Co-evaluation    PT/OT/SLP Co-Evaluation/Treatment: Yes Reason for Co-Treatment: Complexity of the patient's impairments (multi-system involvement);To address functional/ADL transfers;For patient/therapist safety   OT goals addressed during session: ADL's and self-care;Strengthening/ROM      AM-PAC OT "6 Clicks" Daily Activity     Outcome Measure   Help from another person eating meals?: None Help from another person taking care of personal grooming?: None Help from another person toileting,  which includes using toliet, bedpan, or urinal?: A Lot Help from another person bathing (including washing, rinsing, drying)?: A Lot Help from another person to put on and taking off regular upper body clothing?: A Lot Help from another person to put on and taking off regular lower body clothing?: Total 6 Click Score: 15    End of Session Equipment Utilized During Treatment: Rolling walker;Gait belt CPM Left Knee CPM Left Knee: On  OT Visit Diagnosis: Muscle weakness (generalized) (M62.81);Pain Pain - Right/Left: (R and L) Pain - part of body: Hip;Leg;Shoulder   Activity Tolerance Patient tolerated treatment well(limited due to dizziness)   Patient Left in bed;with call bell/phone within reach;with bed alarm set   Nurse Communication Mobility status(requests nausea meds)        Time: [id number] OT Time Calculation (min): 35 min  Charges: OT General Charges $OT Visit: 1 Visit OT Treatments $Self Care/Home Management : 8-22 mins  Marcy SirenBreanna Letisha Yera, OT Supplemental Rehabilitation Services Pager [phone] Office [phone]    Orlando PennerBreanna L Irelynn Schermerhorn 07/31/2018, 1:05 PM

## 2018-07-31 NOTE — TOC Progression Note (Signed)
Transition of Care [facility]) - Progression Note    Patient Details  Name: [name] MRN: [id number] Date of Birth: 1965/02/22  Transition of [facility]) CM/SW [place], [place] Phone Number: 07/31/2018, 11:23 AM  Clinical Narrative:     CSW called and spoke with Gerald Stabs with [facility]. The patient still has a pending LOG bed offer. CSW spoke with him and asked if they could look at the patient. Gerald Stabs asked if the CSW could send updated therapy notes and resend the Fl2 note. CSW informed Gerald Stabs that the patient would be finished with his antibiotics on 6/26. He said that they would take a look at him.   CSW sent over most recent therapy notes and Fl2. CSW will continue to follow and assist with disposition planning.     Expected Discharge Plan: [place] Barriers to Discharge: Inadequate or no insurance  Expected Discharge Plan and Services Expected Discharge Plan: [place] arrangements for the past 2 months: Single Family Home(one level)                                       Social Determinants of Health (SDOH) Interventions    Readmission Risk Interventions Readmission Risk Prevention Plan 06/28/2018  Transportation Screening Complete  HRI or [place] Complete  Social Work Consult for [place] Planning/Counseling Complete  Palliative Care Screening Not Applicable  Medication Review Press [employer]) Referral to Pharmacy  Some recent data might be hidden

## 2018-07-31 NOTE — Progress Notes (Signed)
Physical Therapy Treatment Patient Details Name: [name] MRN: [id number] DOB: February 27, 1965 Today's Date: 07/31/2018    History of Present Illness Pt. with PMH of hep C, substance abuse; admitted on 06/19/2018, presented with complaint of back pain, was found to have musculoskeletal back pain as well as right upper extremity cellulitis; Patient had a debridement of his shoulder perfromed on 5/20.     PT Comments    Pt continues to be self-limiting with several complaints of fatigue, dizziness, and pain. We continue to not be able to progress past sit<>stand transfers, and pt refuses transfer to recliner chair. BP soft throughout session however does not appear to be significantly impacted by position. See below for details. Continue to feel that SNF level rehab is going to be the most appropriate at this time. Will continue to follow.    95/74 in sitting after first stand 91/70 during second stand 92/59 in sitting after second stand   Follow Up Recommendations  SNF     Equipment Recommendations  Other (comment)    Recommendations for Other Services Rehab consult     Precautions / Restrictions Precautions Precautions: Shoulder Type of Shoulder Precautions: Active protocol: PROM/AROM shoulder FF to 90, abduct to 60, ER to 30; verbal order per Dr. Marlou Sa on 5/25 AROM elbow/wrist/hand as tolerated  Shoulder Interventions: Shoulder sling/immobilizer;For comfort Required Braces or Orthoses: Sling Restrictions Weight Bearing Restrictions: Yes RUE Weight Bearing: Weight bearing as tolerated LLE Weight Bearing: Weight bearing as tolerated Other Position/Activity Restrictions: L LE WBAT    Mobility  Bed Mobility Overal bed mobility: Needs Assistance Bed Mobility: Supine to Sit;Sit to Supine Rolling: Max assist   Supine to sit: Min assist;+2 for physical assistance;+2 for safety/equipment Sit to supine: Mod assist;+2 for physical assistance;+2 for safety/equipment   General bed  mobility comments: pt requiring less assist to transition to EOB today, able to bring LEs over EOB with assist for trunk and to scoot hips; increased assist to guide trunk/LEs onto EOB when returning to supine due to dizziness  Transfers Overall transfer level: Needs assistance Equipment used: Rolling walker (2 wheeled) Transfers: Sit to/from Stand Sit to Stand: Mod assist;+2 physical assistance;+2 safety/equipment   Squat pivot transfers: Mod assist;+2 safety/equipment;+2 physical assistance     General transfer comment: pt stood x2 from EOB, requires boosting and steadying assist at [facility]; pt reports increased dizziness with standing requiring return to sitting   Ambulation/Gait             General Gait Details: Not able to progress to ambulation this session.    Stairs             Wheelchair Mobility    Modified Rankin (Stroke Patients Only)       Balance Overall balance assessment: Needs assistance Sitting-balance support: No upper extremity supported;Feet supported Sitting balance-Leahy Scale: Fair Sitting balance - Comments: needing supervision for static balance, holds onto EOB for support Postural control: Left lateral lean Standing balance support: Bilateral upper extremity supported;During functional activity Standing balance-Leahy Scale: Poor Standing balance comment: heavily reliant on external support                            Cognition Arousal/Alertness: Awake/alert Behavior During Therapy: WFL for tasks assessed/performed Overall Cognitive Status: Within Functional Limits for tasks assessed  General Comments: Self-limiting      Exercises General Exercises - Upper Extremity Shoulder Flexion: AROM;Right;10 reps(0-90*) Shoulder ABduction: AROM;Right;5 reps(0-60*) Shoulder Exercises Shoulder External Rotation: AROM;Right;5 reps(0-neutral)    General Comments        Pertinent  Vitals/Pain Pain Assessment: Faces Faces Pain Scale: Hurts even more Pain Location: LLE, L shoulder when returning to supine Pain Descriptors / Indicators: Sore;Grimacing;Guarding;Moaning;Crying Pain Intervention(s): Monitored during session    Home Living                      Prior Function            PT Goals (current goals can now be found in the care plan section) Acute Rehab PT Goals Patient Stated Goal: "Be able to walk so I can go home" PT Goal Formulation: With patient Time For Goal Achievement: 07/05/18 Potential to Achieve Goals: Good Progress towards PT goals: Progressing toward goals    Frequency    Min 3X/week      PT Plan Current plan remains appropriate    Co-evaluation PT/OT/SLP Co-Evaluation/Treatment: Yes Reason for Co-Treatment: Complexity of the patient's impairments (multi-system involvement);Necessary to address cognition/behavior during functional activity;For patient/therapist safety;To address functional/ADL transfers PT goals addressed during session: Mobility/safety with mobility;Balance;Proper use of DME;Strengthening/ROM OT goals addressed during session: ADL's and self-care;Strengthening/ROM      AM-PAC PT "6 Clicks" Mobility   Outcome Measure  Help needed turning from your back to your side while in a flat bed without using bedrails?: A Lot Help needed moving from lying on your back to sitting on the side of a flat bed without using bedrails?: A Lot Help needed moving to and from a bed to a chair (including a wheelchair)?: A Lot Help needed standing up from a chair using your arms (e.g., wheelchair or bedside chair)?: A Lot Help needed to walk in hospital room?: Total Help needed climbing 3-5 steps with a railing? : Total 6 Click Score: 10    End of Session Equipment Utilized During Treatment: Gait belt Activity Tolerance: Patient limited by pain Patient left: in bed;with call bell/phone within reach;with bed alarm set Nurse  Communication: Mobility status PT Visit Diagnosis: Unsteadiness on feet (R26.81);Muscle weakness (generalized) (M62.81);Pain;Other abnormalities of gait and mobility (R26.89) Pain - Right/Left: Right Pain - part of body: Arm(back)     Time: [id number] PT Time Calculation (min) (ACUTE ONLY): 35 min  Charges:  $Therapeutic Activity: 8-22 mins                     Conni SlipperLaura Caidynce Muzyka, PT, DPT Acute Rehabilitation Services Pager: [phone] Office: [phone]    Marylynn PearsonLaura D Faithlynn Deeley 07/31/2018, 2:00 PM

## 2018-08-01 ENCOUNTER — Inpatient Hospital Stay (HOSPITAL_COMMUNITY): Payer: Medicaid Other

## 2018-08-01 LAB — C-REACTIVE PROTEIN: CRP: 24 mg/dL — ABNORMAL HIGH (ref <1.0)

## 2018-08-01 LAB — PROCALCITONIN: Procalcitonin: <0.1 ng/mL

## 2018-08-01 LAB — SEDIMENTATION RATE: Sed Rate: >140 mm/hr — ABNORMAL HIGH (ref 0–16)

## 2018-08-01 LAB — TROPONIN I: Troponin I: <0.03 ng/mL (ref <0.03)

## 2018-08-01 MED ORDER — ALTEPLASE 2 MG IJ SOLR: 2.0000 mg | Freq: Once | INTRAMUSCULAR | Status: DC

## 2018-08-01 MED ORDER — PIPERACILLIN-TAZOBACTAM 3.375 G IVPB 30 MIN: 3.3750 g | Freq: Three times a day (TID) | INTRAVENOUS | Status: DC

## 2018-08-01 MED ORDER — IOHEXOL 350 MG/ML SOLN
75.0000 mL | Freq: Once | INTRAVENOUS | Status: AC | PRN
  Administered 2018-08-01: 75 mL via INTRAVENOUS

## 2018-08-01 MED ORDER — ALTEPLASE 2 MG IJ SOLR
2.0000 mg | Freq: Once | INTRAMUSCULAR | Status: AC
  Administered 2018-08-01: 2 mg

## 2018-08-01 MED ORDER — OXYCODONE-ACETAMINOPHEN 5-325 MG PO TABS
1.0000 | ORAL_TABLET | ORAL | Status: DC | PRN
  Administered 2018-08-01 – 2018-08-03 (×8): 1 via ORAL
  Filled 2018-08-01 (×9): qty 1

## 2018-08-01 MED ORDER — PIPERACILLIN-TAZOBACTAM 3.375 G IVPB
3.3750 g | Freq: Three times a day (TID) | INTRAVENOUS | Status: DC
  Administered 2018-08-01 – 2018-08-04 (×9): 3.375 g via INTRAVENOUS
  Filled 2018-08-01 (×10): qty 50

## 2018-08-01 NOTE — Progress Notes (Signed)
PROGRESS NOTE  Carollee MassedJames Slaymaker ZOX:[id number] DOB: 07/29/1965 DOA: 06/19/2018 PCP: Patient, No Pcp Per  Brief History   53 year old with past medical history significant for hepatitis C, who presented to the ED with complaints of increasing back pain, radiating down to the left leg initially but subsequently both legs. Patient was having difficulty standing and walking. Per patient the pain started after he attempted to lift a lawnmower over a week ago. Evaluation in the ED, MRI showed severe foraminal narrowing at level L5-S1.   He subsequently was admitted with sepsis secondary to right upper extremity cellulitis and was started on IV antibiotics, however cellulitis did not improve. He did havea CT scan which showed intra-facial abscess in the right shoulder along with septic arthritis of the [facility] joint. Patient had debridement of multiple abscess. During hospitalization patient began to have back pain and right thigh weakness. MRI of the lumbar spine which showed discitis as well as septic arthritis and paraspinal abscess. During hospitalization he did have left knee swelling, he had arthrocentesis which showed gram-positive cocci and had left knee washout.  Patient was evaluated by ID who is recommending 6 to 8 weeks of IV antibiotics.  Vancomycin is started 06/23/2018.   Consultants  . Infectious disease . Orthopedic surgery . Neurosurgery  Procedures  . Arthrocentesis  Antibiotics  Vancomycin Anti-infectives (From admission, onward)   Start     Dose/Rate Route Frequency Ordered Stop   08/01/18 1530  piperacillin-tazobactam (ZOSYN) IVPB 3.375 g     3.375 g 100 mL/hr over 30 Minutes Intravenous Every 8 hours 08/01/18 1529     06/29/18 1400  vancomycin (VANCOCIN) IVPB 750 mg/150 ml premix     750 mg 150 mL/hr over 60 Minutes Intravenous Every 12 hours 06/29/18 1342     06/29/18 0000  vancomycin IVPB     750 mg Intravenous Every 12 hours 06/29/18 1407 08/09/18 2359   06/28/18  1735  gentamicin (GARAMYCIN) injection  Status:  Discontinued       As needed 06/28/18 1736 06/28/18 1838   06/28/18 1734  vancomycin (VANCOCIN) powder  Status:  Discontinued       As needed 06/28/18 1735 06/28/18 1838   06/26/18 1300  vancomycin (VANCOCIN) 1,250 mg in sodium chloride 0.9 % 250 mL IVPB  Status:  Discontinued     1,250 mg 166.7 mL/hr over 90 Minutes Intravenous Every 12 hours 06/26/18 1057 06/26/18 1136   06/26/18 1300  vancomycin (VANCOCIN) IVPB 1000 mg/200 mL premix  Status:  Discontinued     1,000 mg 200 mL/hr over 60 Minutes Intravenous Every 12 hours 06/26/18 1136 06/29/18 1342   06/26/18 0000  vancomycin IVPB  Status:  Discontinued     1,000 mg Intravenous Every 12 hours 06/26/18 1616 06/29/18    06/24/18 1200  vancomycin (VANCOCIN) 1,500 mg in sodium chloride 0.9 % 500 mL IVPB  Status:  Discontinued     1,500 mg 250 mL/hr over 120 Minutes Intravenous Every 24 hours 06/23/18 1040 06/26/18 1057   06/24/18 1000  cefTRIAXone (ROCEPHIN) 2 g in sodium chloride 0.9 % 100 mL IVPB  Status:  Discontinued     2 g 200 mL/hr over 30 Minutes Intravenous Every 24 hours 06/23/18 0857 06/24/18 1433   06/23/18 1848  vancomycin (VANCOCIN) powder  Status:  Discontinued       As needed 06/23/18 1848 06/23/18 1956   06/23/18 1847  gentamicin (GARAMYCIN) injection  Status:  Discontinued       As needed 06/23/18 1847  06/23/18 1956   06/23/18 0900  vancomycin (VANCOCIN) 1,500 mg in sodium chloride 0.9 % 500 mL IVPB     1,500 mg 250 mL/hr over 120 Minutes Intravenous  Once 06/23/18 0855 06/24/18 0814   06/23/18 0900  cefTRIAXone (ROCEPHIN) 1 g in sodium chloride 0.9 % 100 mL IVPB     1 g 200 mL/hr over 30 Minutes Intravenous  Once 06/23/18 0857 06/24/18 0814   06/20/18 0900  cefTRIAXone (ROCEPHIN) 1 g in sodium chloride 0.9 % 100 mL IVPB  Status:  Discontinued     1 g 200 mL/hr over 30 Minutes Intravenous Every 24 hours 06/20/18 0824 06/23/18 0857      Subjective  Patient seen and  examined.  He was complaining of left anterior chest pain which was sharp and 10 out of 10 and getting worse with deep breathing.  According to him, he has been having this for the last 6 to 7 days however he did not mention any pain to be yesterday morning.  He tells me that he forgot to mention that.  He was very comfortable when I saw him yesterday.  He claims that his pain is constant since 7 days but since he was very comfortable yesterday so I wonder the reliability of that statement.  An elevated d-dimer that was found yesterday, stat CT angiogram of the chest was done and he was ruled out of PE however he has loculated pleural effusion on the left with bilateral consolidation and atelectasis.  I was paged by nurse later on that he is demanding to increase his pain medications and he specifically asked for increasing his Valium dose.  Objective   Vitals:  Vitals:   07/31/18 2353 08/01/18 0325  BP:  120/75  Pulse: (!) 103 (!) 105  Resp: 16 16  Temp: 98.1 F (36.7 C) 98 F (36.7 C)  SpO2: 95% 96%    Exam:  General exam: Comfortable, alert and oriented Respiratory system: Clear to auscultation. Respiratory effort normal. Cardiovascular system: S1 & S2 heard, RRR. No JVD, murmurs, rubs, gallops or clicks. No pedal edema. Gastrointestinal system: Abdomen is nondistended, soft and nontender. No organomegaly or masses felt. Normal bowel sounds heard. Central nervous system: Alert and oriented. No focal neurological deficits. Extremities: Symmetric 5 x 5 power.  Slightly swollen left knee but nontender with no effusion. Skin: No rashes, lesions or ulcers Psychiatry: Judgement and insight appear poor. Mood & affect appropriate.   I have personally reviewed the following:   Today's Data  . Vitals, CMP, CBC  Micro Data  . MRSA by PCR negative  Scheduled Meds: . diclofenac sodium  2 g Topical QID  . docusate sodium  200 mg Oral Daily  . enoxaparin (LOVENOX) injection  40 mg  Subcutaneous Daily  . esomeprazole  20 mg Oral QAC breakfast  . feeding supplement (ENSURE ENLIVE)  237 mL Oral TID BM  . ferrous sulfate  325 mg Oral BID WC  . folic acid  1 mg Oral Daily  . methocarbamol  500 mg Oral TID  . metoprolol tartrate  12.5 mg Oral BID  . multivitamin with minerals  1 tablet Oral Daily  . polyethylene glycol  17 g Oral BID  . sodium chloride flush  10-40 mL Intracatheter Q12H  . thiamine  100 mg Oral Daily   Or  . thiamine  100 mg Intravenous Daily  . traMADol  50 mg Oral Q6H  . vitamin B-12  100 mcg Oral Daily  Continuous Infusions: . sodium chloride 10 mL/hr at 07/31/18 1433  . methocarbamol (ROBAXIN) IV    . piperacillin-tazobactam    . vancomycin 750 mg (08/01/18 0115)    Principal Problem:   Septic arthritis of multiple joints (HCC) Active Problems:   Hepatitis C antibody test positive   Cigarette smoker   Intractable back pain   AKI (acute kidney injury) ([place])   Spinal stenosis of lumbosacral region   GERD (gastroesophageal reflux disease)   Mild protein-calorie malnutrition (HCC)   Septic arthritis of knee, left (HCC)   Acute medial meniscal tear, left, initial encounter   Polysubstance abuse (HCC)   Normocytic anemia   Effusion of left elbow   Recurrent boils   Discitis of lumbosacral region   Psoas abscess, right (HCC)   Multiple abscesses of right shoulder   Chronic infection of left knee (HCC)   LOS: 42 days    A & P  Sepsis secondary to cellulitis of the right upper extremity multiple abscess large multiple lumbar abscess and septic arthritis of the left knee: Patient presented with tachycardia, leukocytosis and back pain. MRI 5/5 showed severe lumbar spinal stenosis. MRI 5/19 showed L4 S1 discitis, L4-L5 septic arthritis multiple paraspinal abscess. CT right shoulder showed intramuscular and infra facial abscess along with septic arthritis of the [facility] joint. Neurosurgery was consulted and Recommended repeat MRI near the end of  antibiotics.  Antibiotics were started 06/23/2018.  He is on vancomycin.  This is for a duration of 6 weeks. Pain due to discitis and paraspinal abscess limit the patient's ability to participate with therapy however he is getting better every day.  ID consulted, recommend minimum of 6 weeks of IV antibiotics.Antibiotics started 06/23/2018.  Last blood culture negative starting 06/28/2018. Orthopedic consulted. Patient is status post left knee arthrocentesis and washout. S/p irrigation and debridement of right shoulder, acromial medial clavicular joint irrigation and debridement on the right. Echocardiogram was negative for vegetation. PT consulted recommending SNF, however the patient has no insurance and a history of illicit drug use. The suture left knee was removed. CPM machine ordered. Added low dose morphine prior patient using CPM machine. He is tolerating CPM machine well.   Vertigo: Resolved.  AKI: Came in with creatinine of 3.5.  Now normal.  Normocytic anemia: Hemoglobin remains stable between 7 and 8. Will need outpatient colonoscopy.  Polysubstance abuse: Admits to using cocaine prior to admission.  Denies using IV heroin.  UDS positive for benzodiazepines and cocaine.  Essential hypertension with tachycardia: Improved on Lopressor.  Chest pain/bilateral H CAP: I was notified yesterday late in the afternoon.  EKG was done stat which did not show any acute ST-T wave changes.  Serial troponins are negative.  CT angiogram rules out any PE but confirms left lower lobe loculated effusion and bilateral consolidation.  He has remained afebrile.  I will check his procalcitonin and in the meantime I will start him on Zosyn for possible aspiration pneumonia and to cover gram-negative rods.  I have increased his morphine frequency to every 4 hours.  Total time spent: 31 minutes  DVT prophylaxis:Lovenox Code Status:Full code Family Communication:None Disposition Plan:Stay in the  hospital for IV antibiotics  Ishmael Holter, MD [facility] Direct contact: see [email or web address]  7PM-7AM contact night coverage as above

## 2018-08-01 NOTE — Progress Notes (Signed)
Orthopedic Tech Progress Note Patient Details:  [name] Dec 03, 1965 [id number]  CPM Left Knee CPM Left Knee: On Left Knee Flexion (Degrees): 70 Left Knee Extension (Degrees): 6 Additional Comments: patient can not tolerate flexion  Post Interventions Patient Tolerated: Well Instructions Provided: Care of device  [name] 08/01/2018, 4:08 PM

## 2018-08-01 NOTE — Progress Notes (Signed)
Chart reviewed for LOS B Garv Kuechle RN,MHA,BSN Advance Care Supervisor [phone] 

## 2018-08-01 NOTE — Progress Notes (Signed)
Patient's PICC is not working right and we can not draw blood from line, also when 10 cc NS flushed through PICC it went in slow. Patient refuses to let lab draw blood.

## 2018-08-01 NOTE — Progress Notes (Signed)
Attempted to draw labs from PICC. PICC slow to flush, only able to pull back 10cc blood. Notified nurse Almyra Free of concerns regarding PICC. Almyra Free to attempt draw from PICC as patient refuses for lab to draw blood.

## 2018-08-01 NOTE — Care Management (Signed)
Spoke with patient and daughter about the plan of care as we draw closer to his IV abx being completed. Daughter would like rehab. She is going to discuss this with the patient this pm. CSW is still waiting to hear from [place] about an offer. TOC following.

## 2018-08-02 NOTE — Progress Notes (Signed)
PT Cancellation Note  Patient Details Name: [name] MRN: [id number] DOB: Oct 17, 1965   Cancelled Treatment:      Therapy attempted to see the patient 2x. On both occasions he stated he was given a new medicine and can not stay awake. Despite max cuing he kept falling back to sleep. PT will follow up on 07/03/2018   Carney Living PT DPT  08/02/2018, 2:08 PM

## 2018-08-02 NOTE — Progress Notes (Signed)
PROGRESS NOTE    Carollee MassedJames Bergh  ONG:[id number] DOB: 09/27/1965 DOA: 06/19/2018 PCP: Patient, No Pcp Per   Brief Narrative: 53 year old with past medical history significant for hepatitis C, who presented to the ED with complaints of increasing back pain, radiating down to the left leg initially but subsequently both legs. Patient was having difficulty standing and walking. Per patient the pain started after he attempted to lift a lawnmower over a week ago. Evaluation in the ED, MRI showed severe foraminal narrowing at level L5-S1.   He subsequently was admitted with sepsis secondary to right upper extremity cellulitis and was started on IV antibiotics, however cellulitis did not improve. He did havea CT scan which showed intra-facial abscess in the right shoulder along with septic arthritis of the [facility]. Patient had debridement of multiple abscess. During hospitalization patient began to have back pain and right thigh weakness. MRI of the lumbar spine which showed discitis as well as septic arthritis and paraspinal abscess. During hospitalization he did have left knee swelling, he had arthrocentesis which showed gram-positive cocci and had left knee washout.  Patient was evaluated by ID who is recommending 6 to 8 weeks of IV antibiotics. Vancomycin is started 06/23/2018. Patient had chest pain,+ d dimer- CTPE NEG for PE, but with pneumonia- started on Zosyn 6/22  Subjective: No new complaints. Pain controlled. Concerned about going home, ?rehab. Sitting on bedside chair at daytime.  Assessment & Plan:   Sepsis from septic arthritis of multiple joints w Effusion of left elbow/Recurrent boils/Discitis of lumbosacral region/Multiple abscesses of right shoulder/ cellulitis of the right upper extremity multiple abscess large multiple lumbar abscess/septic arthritis of the left knee: admitted with tachycardia, leukocytosis and back pain. MRI 5/5 showed severe lumbar spinal stenosis. MRI 5/19  showed L4 S1 discitis, L4-L5 septic arthritis multiple paraspinal abscess. CT right shoulder showed intramuscular and infra facial abscess along with septic arthritis of the [facility] joint. Neurosurgery was consulted and Recommended repeat MRI near the end of antibiotics.Antibiotics were started 06/23/2018. He is on vancomycin. Orthopedic consulted. Patient is status post left knee arthrocentesis and washout. S/p irrigation and debridement of right shoulder, acromial medial clavicular joint irrigation and debridement on the right. Echocardiogram was negative for vegetation. PT consulted recommending SNF, however the patient has no insurance and a history of illicit drug use. The suture left knee was removed.He is tolerating CPM machine well.  Vanco is for at least 6 weeks from his most recent right shoulder surgery on 06/28/2018 as per ID.Last blood culture negative starting 06/28/2018. Pain due to discitis and paraspinal abscess limit the patient's ability to participate with therapy, but able to transfer to the bedside chair.  Concern about going home, social worker/daughter talking about rehab.  Procalcitonin negative 5/22 . continue on pain control.  Muscle relaxant and encourage PT OT  Hepatitis C antibody test positive Outpatient follow-up.  Cigarette smoker: Cessation advised.  AKI : Resolved from 3.5.  Anemia normocytic, w chronic disease: hemoglobin stable ranging between 7-8.  Need outpatient colonoscopy/GI evaluation.Continue on B12, folic acid and iron sulfate.  Vertigo: resolved  Spinal stenosis of lumbosacral region: Continue pain control-tramadol/muscle relaxant.  Mild protein-calorie malnutrition: Augment nutrition.  Polysubstance abuse, admitted using cocaine prior to admission, denied IV drug abuse.  UDS positive for benzos and cocaine.  Cessation advised counseled.  Essential hypertension/tachycardia: Stable on Lopressor.   HCAP/Episode of chest pain on 6/22 work-up unremarkable  for PE, serial troponin EKG, CT scan showed pneumonia -suspecting aspiration pneumonia and placed  on Zosyn.-Procalcitonin was normal.   DVT prophylaxis:lovenox Code Status: full Family Communication: No family at bedside Disposition Plan: Remains inpatient pending clinical improvement. Daughter spoke w SW re Rehab. Pt reports he lives w/ elderly father.   Consultants   Infectious disease  Orthopedic surgery  Neurosurgery  Procedures  Multiple procedures: -5/14- left kneearthrocentesiswas done, synovial fluidgrew gram-positive cocci. -5/15 -left knee washout. -5/20-R shoulder   LUE PICC+ RT shoulder c/d/i with staples.   Antimicrobials:  Vanco 5/15 >> Zosyn 6/23>> Anti-infectives (From admission, onward)   Start     Dose/Rate Route Frequency Ordered Stop   08/01/18 1600  piperacillin-tazobactam (ZOSYN) IVPB 3.375 g     3.375 g 12.5 mL/hr over 240 Minutes Intravenous Every 8 hours 08/01/18 1559     08/01/18 1530  piperacillin-tazobactam (ZOSYN) IVPB 3.375 g  Status:  Discontinued     3.375 g 100 mL/hr over 30 Minutes Intravenous Every 8 hours 08/01/18 1529 08/01/18 1558   06/29/18 1400  vancomycin (VANCOCIN) IVPB 750 mg/150 ml premix     750 mg 150 mL/hr over 60 Minutes Intravenous Every 12 hours 06/29/18 1342     06/29/18 0000  vancomycin IVPB     750 mg Intravenous Every 12 hours 06/29/18 1407 08/09/18 2359   06/28/18 1735  gentamicin (GARAMYCIN) injection  Status:  Discontinued       As needed 06/28/18 1736 06/28/18 1838   06/28/18 1734  vancomycin (VANCOCIN) powder  Status:  Discontinued       As needed 06/28/18 1735 06/28/18 1838   06/26/18 1300  vancomycin (VANCOCIN) 1,250 mg in sodium chloride 0.9 % 250 mL IVPB  Status:  Discontinued     1,250 mg 166.7 mL/hr over 90 Minutes Intravenous Every 12 hours 06/26/18 1057 06/26/18 1136   06/26/18 1300  vancomycin (VANCOCIN) IVPB 1000 mg/200 mL premix  Status:  Discontinued     1,000 mg 200 mL/hr over 60  Minutes Intravenous Every 12 hours 06/26/18 1136 06/29/18 1342   06/26/18 0000  vancomycin IVPB  Status:  Discontinued     1,000 mg Intravenous Every 12 hours 06/26/18 1616 06/29/18    06/24/18 1200  vancomycin (VANCOCIN) 1,500 mg in sodium chloride 0.9 % 500 mL IVPB  Status:  Discontinued     1,500 mg 250 mL/hr over 120 Minutes Intravenous Every 24 hours 06/23/18 1040 06/26/18 1057   06/24/18 1000  cefTRIAXone (ROCEPHIN) 2 g in sodium chloride 0.9 % 100 mL IVPB  Status:  Discontinued     2 g 200 mL/hr over 30 Minutes Intravenous Every 24 hours 06/23/18 0857 06/24/18 1433   06/23/18 1848  vancomycin (VANCOCIN) powder  Status:  Discontinued       As needed 06/23/18 1848 06/23/18 1956   06/23/18 1847  gentamicin (GARAMYCIN) injection  Status:  Discontinued       As needed 06/23/18 1847 06/23/18 1956   06/23/18 0900  vancomycin (VANCOCIN) 1,500 mg in sodium chloride 0.9 % 500 mL IVPB     1,500 mg 250 mL/hr over 120 Minutes Intravenous  Once 06/23/18 0855 06/24/18 0814   06/23/18 0900  cefTRIAXone (ROCEPHIN) 1 g in sodium chloride 0.9 % 100 mL IVPB     1 g 200 mL/hr over 30 Minutes Intravenous  Once 06/23/18 0857 06/24/18 0814   06/20/18 0900  cefTRIAXone (ROCEPHIN) 1 g in sodium chloride 0.9 % 100 mL IVPB  Status:  Discontinued     1 g 200 mL/hr over 30 Minutes Intravenous Every 24 hours 06/20/18  [id number] 06/23/18 0857       Objective: Vitals:   08/01/18 1609 08/01/18 2050 08/01/18 2339 08/02/18 0420  BP: 121/65 116/64 114/63 121/65  Pulse: (!) 104 (!) 119 (!) 103 (!) 106  Resp: 15 17 17 17   Temp: 98.1 F (36.7 C) 98.9 F (37.2 C) 98.2 F (36.8 C) 98.4 F (36.9 C)  TempSrc: Oral Oral Oral Oral  SpO2: 97% 91% 94% 92%  Weight:      Height:       No intake or output data in the 24 hours ending 08/02/18 0814 Filed Weights   06/19/18 1303 06/23/18 1708 06/28/18 1543  Weight: 74.8 kg 74.8 kg 74.8 kg   Weight change:   Body mass index is 23.68 kg/m.  Intake/Output from previous  day: No intake/output data recorded. Intake/Output this shift: No intake/output data recorded.  Examination:  General exam: Appears calm and comfortable,NAD. HEENT:PERRL,Oral mucosa moist, Ear/Nose normal on gross exam Respiratory system: Bilateral equal air entry, normal vesicular breath sounds, no wheezes or crackles  Cardiovascular system: S1 & S2 heard,No JVD, murmurs. Gastrointestinal system: Abdomen is  soft, non tender, non distended, BS +  Nervous System:Alert and oriented. No focal neurological deficits/moving extremities, sensation intact. Extremities: No edema, no clubbing, distal peripheral pulses palpable. LUE PICC+ RT shoulder c/d/i with staples.  Skin: No rashes, lesions, no icterus MSK: Normal muscle bulk,tone ,power  Medications:  Scheduled Meds:  diclofenac sodium  2 g Topical QID   docusate sodium  200 mg Oral Daily   enoxaparin (LOVENOX) injection  40 mg Subcutaneous Daily   esomeprazole  20 mg Oral QAC breakfast   feeding supplement (ENSURE ENLIVE)  237 mL Oral TID BM   ferrous sulfate  325 mg Oral BID WC   folic acid  1 mg Oral Daily   methocarbamol  500 mg Oral TID   metoprolol tartrate  12.5 mg Oral BID   multivitamin with minerals  1 tablet Oral Daily   polyethylene glycol  17 g Oral BID   sodium chloride flush  10-40 mL Intracatheter Q12H   thiamine  100 mg Oral Daily   Or   thiamine  100 mg Intravenous Daily   traMADol  50 mg Oral Q6H   vitamin B-12  100 mcg Oral Daily   Continuous Infusions:  sodium chloride 10 mL/hr at 07/31/18 1433   methocarbamol (ROBAXIN) IV     piperacillin-tazobactam (ZOSYN)  IV 3.375 g (08/02/18 0146)   vancomycin 750 mg (08/02/18 0143)    Data Reviewed: I have personally reviewed following labs and imaging studies  CBC: Recent Labs  Lab 07/27/18 0412 07/27/18 2346  WBC 13.2* 13.8*  NEUTROABS 9.0* 9.0*  HGB 7.4* 7.5*  HCT 24.4* 24.6*  MCV 84.7 84.0  PLT 635* 627*   Basic Metabolic  Panel: Recent Labs  Lab 07/27/18 0412 07/28/18 0500 07/31/18 0539  NA 135 134* 132*  K 3.8 3.9 3.8  CL 97* 97* 93*  CO2 28 29 29   GLUCOSE 106* 98 96  BUN 17 18 16   CREATININE 0.69 0.62 0.68  CALCIUM 9.1 8.9 9.0   GFR: Estimated Creatinine Clearance: 110.3 mL/min (by C-G formula based on SCr of 0.68 mg/dL). Liver Function Tests: No results for input(s): AST, ALT, ALKPHOS, BILITOT, PROT, ALBUMIN in the last 168 hours. No results for input(s): LIPASE, AMYLASE in the last 168 hours. No results for input(s): AMMONIA in the last 168 hours. Coagulation Profile: No results for input(s): INR, PROTIME in the last 168  hours. Cardiac Enzymes: Recent Labs  Lab 07/31/18 1700 07/31/18 2222 08/01/18 1654  TROPONINI <0.03 <0.03 <0.03   BNP (last 3 results) No results for input(s): PROBNP in the last 8760 hours. HbA1C: No results for input(s): HGBA1C in the last 72 hours. CBG: No results for input(s): GLUCAP in the last 168 hours. Lipid Profile: No results for input(s): CHOL, HDL, LDLCALC, TRIG, CHOLHDL, LDLDIRECT in the last 72 hours. Thyroid Function Tests: No results for input(s): TSH, T4TOTAL, FREET4, T3FREE, THYROIDAB in the last 72 hours. Anemia Panel: No results for input(s): VITAMINB12, FOLATE, FERRITIN, TIBC, IRON, RETICCTPCT in the last 72 hours. Sepsis Labs: Recent Labs  Lab 08/01/18 1654  PROCALCITON <0.10    Recent Results (from the past 240 hour(s))  MRSA PCR Screening     Status: None   Collection Time: 07/25/18  9:16 AM   Specimen: Nasopharyngeal  Result Value Ref Range Status   MRSA by PCR NEGATIVE NEGATIVE Final    Comment:        The GeneXpert MRSA Assay (FDA approved for NASAL specimens only), is one component of a comprehensive MRSA colonization surveillance program. It is not intended to diagnose MRSA infection nor to guide or monitor treatment for MRSA infections. Performed at [facility], [place] [address] [place], [place] [address]        Radiology Studies: Ct Angio Chest Pe W Or Wo Contrast  Result Date: 08/01/2018 CLINICAL DATA:  Chest pain EXAM: CT ANGIOGRAPHY CHEST WITH CONTRAST TECHNIQUE: Multidetector CT imaging of the chest was performed using the standard protocol during bolus administration of intravenous contrast. Multiplanar CT image reconstructions and MIPs were obtained to evaluate the vascular anatomy. CONTRAST:  29mL OMNIPAQUE IOHEXOL 350 MG/ML SOLN COMPARISON:  Chest radiograph July 30, 2018 FINDINGS: Cardiovascular: There is no demonstrable pulmonary embolus. There is no thoracic aortic aneurysm or dissection. Visualized great vessels appear unremarkable. There is no pericardial effusion or pericardial thickening. Central catheter tip is in the superior vena cava. Mediastinum/Nodes: Visualized thyroid appears unremarkable. There are scattered subcentimeter mediastinal lymph nodes. No adenopathy by size criteria evident. No esophageal lesions are appreciable. Lungs/Pleura: There is a sizable partially loculated the largely free-flowing pleural effusion on the left. There is consolidation in the left lower lobe, in part due to compressive atelectasis. On the right, there is airspace consolidation with atelectasis in the posterior segment of the right lower lobe. To a lesser extent, there is consolidation in portions of the superior segment of the right lower lobe. On axial slice 79 series 6, there is a 5 mm nodular opacity abutting the pleura in the lateral segment of the right lower lobe. Upper Abdomen: Visualized upper abdominal structures appear unremarkable. Musculoskeletal: There are no blastic or lytic bone lesions. Soft tissue fullness in the right shoulder region is noted, incompletely visualized. There is a partially calcified masslike area in the right subscapularis muscle measuring 4.3 x 3.1 x 2.5 cm. Review of the MIP images confirms the above findings. IMPRESSION: 1. No demonstrable pulmonary embolus. No thoracic  aortic aneurysm or dissection. 2. Sizable partially loculated left pleural effusion. Areas of consolidation and compressive atelectasis in the left lower lobe involving a portion of the posterior segment of the left upper lobe. Suspect combination of pneumonia and atelectasis in these areas. 3. Airspace consolidation and atelectasis in the right lower lobe, primarily in the posterior segment. 4. 5 mm nodular opacity right lower lobe lateral segment. No follow-up needed if patient is low-risk. Non-contrast chest CT can  be considered in 12 months if patient is high-risk. This recommendation follows the consensus statement: Guidelines for Management of Incidental Pulmonary Nodules Detected on CT Images: From the Fleischner Society 2017; Radiology 2017; 284:228-243. 5. Masslike area, partially calcified, in the right subscapularis muscle. Question inflammatory etiology for this lesion. 6. Incomplete visualization of soft tissue fullness in the right shoulder region. 7.  No demonstrable thoracic adenopathy. Electronically Signed   By: Bretta BangWilliam  Woodruff III M.D.   On: 08/01/2018 08:59      LOS: 43 days   Time spent: More than 50% of that time was spent in counseling and/or coordination of care.  Lanae Boastamesh Vaughn Frieze, MD [facility]  08/02/2018, 8:14 AM

## 2018-08-03 LAB — BASIC METABOLIC PANEL
Anion gap: 11 (ref 5–15)
BUN: 13 mg/dL (ref 6–20)
CO2: 23 mmol/L (ref 22–32)
Calcium: 7.4 mg/dL — ABNORMAL LOW (ref 8.9–10.3)
Chloride: 103 mmol/L (ref 98–111)
Creatinine, Ser: 0.66 mg/dL (ref 0.61–1.24)
GFR calc Af Amer: >60 mL/min (ref >60)
GFR calc non Af Amer: >60 mL/min (ref >60)
Glucose, Bld: 99 mg/dL (ref 70–99)
Potassium: 3.4 mmol/L — ABNORMAL LOW (ref 3.5–5.1)
Sodium: 137 mmol/L (ref 135–145)

## 2018-08-03 MED ORDER — DIAZEPAM 5 MG PO TABS
5.0000 mg | ORAL_TABLET | Freq: Three times a day (TID) | ORAL | Status: DC | PRN
  Administered 2018-08-03 – 2018-08-10 (×16): 5 mg via ORAL
  Filled 2018-08-03 (×18): qty 1

## 2018-08-03 MED ORDER — OXYCODONE-ACETAMINOPHEN 5-325 MG PO TABS
1.0000 | ORAL_TABLET | Freq: Three times a day (TID) | ORAL | Status: DC | PRN
  Administered 2018-08-03 – 2018-08-10 (×19): 1 via ORAL
  Filled 2018-08-03 (×20): qty 1

## 2018-08-03 MED ORDER — DIAZEPAM 2 MG PO TABS: 2.0000 mg | ORAL_TABLET | Freq: Three times a day (TID) | ORAL | Status: DC | PRN

## 2018-08-03 NOTE — Progress Notes (Signed)
Physical Therapy Treatment Patient Details Name: [name]: [id number] DOB: 07/03/1965 Today's Date: 08/03/2018    History of Present Illness Pt. with PMH of hep C, substance abuse; admitted on 06/19/2018, presented with complaint of back pain, was found to have musculoskeletal back pain as well as right upper extremity cellulitis; Patient had a debridement of his shoulder perfromed on 5/20.     PT Comments    Patient is making progress. He was able to take steps today. His steps were slow and deliberate but steady. He reported a minor increase in left knee pain with gait.  but He sat up on the edge of the bed for nearly 20 minutes. He was able to sit up in the bed with min guard. He was also given exercises in bed. He was given a band for his left arm. He was advised not to do his right arm until cleared by MD. He was also given a band for hip abduction.   Follow Up Recommendations  SNF(patien reports he will be going home )     Equipment Recommendations  Other (comment)    Recommendations for Other Services Rehab consult     Precautions / Restrictions Precautions Precautions: Shoulder Type of Shoulder Precautions: Active protocol: PROM/AROM shoulder FF to 90, abduct to 60, ER to 30; verbal order per Dr. August Saucerean on 5/25 AROM elbow/wrist/hand as tolerated  Shoulder Interventions: Shoulder sling/immobilizer;For comfort Restrictions RUE Weight Bearing: Non weight bearing LLE Weight Bearing: Weight bearing as tolerated    Mobility  Bed Mobility Overal bed mobility: Needs Assistance Bed Mobility: Supine to Sit;Sit to Supine Rolling: Min assist   Supine to sit: Min guard Sit to supine: Min assist   General bed mobility comments: min a to roll to the side. Min guard to sit up. Able to move himselft to the edge of the bed. Needed assist go get legs back into the bed 2nd to fatigue, Patient able to sit up edge of the bed for 20 minutes   Transfers Overall transfer level: Needs  assistance Equipment used: Rolling walker (2 wheeled) Transfers: Sit to/from Stand Sit to Stand: Mod assist         General transfer comment: sit to stand 2x with therapy wiht mod a. Mod cuing to improve base upon standing. Mod cuing to breathe   Ambulation/Gait Ambulation/Gait assistance: Min assist Gait Distance (Feet): 6 Feet Assistive device: Rolling walker (2 wheeled) Gait Pattern/deviations: Step-through pattern Gait velocity: decreased   General Gait Details: 3'x2. Slow but steady stpes. Cuing to breathe   Stairs             Wheelchair Mobility    Modified Rankin (Stroke Patients Only)       Balance   Sitting-balance support: No upper extremity supported;Feet supported Sitting balance-Leahy Scale: Fair Sitting balance - Comments: needing supervision for static balance, holds onto EOB for support   Standing balance support: Bilateral upper extremity supported;During functional activity Standing balance-Leahy Scale: Poor Standing balance comment: heavily reliant on external support                            Cognition Arousal/Alertness: Awake/alert Behavior During Therapy: WFL for tasks assessed/performed Overall Cognitive Status: Within Functional Limits for tasks assessed                                 General Comments: Self-limiting  Exercises Other Exercises Other Exercises: sholder flexion band left x20 ; shoulder press left x20 ; tricpes extension x20 oarnge, horizontal abduction oarnge x20;  Other Exercises: hip abdution with band x20     General Comments        Pertinent Vitals/Pain Pain Assessment: Faces Faces Pain Scale: Hurts a little bit Pain Location: LLE, L shoulder when returning to supine Pain Descriptors / Indicators: Sore;Grimacing;Guarding;Moaning;Crying    Home Living                      Prior Function            PT Goals (current goals can now be found in the care plan  section) Acute Rehab PT Goals Patient Stated Goal: "Be able to walk so I can go home" PT Goal Formulation: With patient Time For Goal Achievement: 07/05/18 Potential to Achieve Goals: Good Progress towards PT goals: Progressing toward goals    Frequency    Min 3X/week      PT Plan Current plan remains appropriate    Co-evaluation              AM-PAC PT "6 Clicks" Mobility   Outcome Measure  Help needed turning from your back to your side while in a flat bed without using bedrails?: A Lot Help needed moving from lying on your back to sitting on the side of a flat bed without using bedrails?: A Lot Help needed moving to and from a bed to a chair (including a wheelchair)?: A Lot Help needed standing up from a chair using your arms (e.g., wheelchair or bedside chair)?: A Lot Help needed to walk in hospital room?: A Lot Help needed climbing 3-5 steps with a railing? : Total 6 Click Score: 11    End of Session Equipment Utilized During Treatment: Gait belt Activity Tolerance: Patient limited by pain Patient left: in bed;with call bell/phone within reach;with bed alarm set Nurse Communication: Mobility status PT Visit Diagnosis: Unsteadiness on feet (R26.81);Muscle weakness (generalized) (M62.81);Pain;Other abnormalities of gait and mobility (R26.89) Pain - Right/Left: Right Pain - part of body: Arm     Time: 1015-1055 PT Time Calculation (min) (ACUTE ONLY): 40 min  Charges:  $Gait Training: 8-22 mins $Therapeutic Exercise: 8-22 mins $Therapeutic Activity: 8-22 mins                       Carney Living PT DPT  08/03/2018, 2:19 PM

## 2018-08-03 NOTE — Plan of Care (Signed)
Progressing towards goals

## 2018-08-03 NOTE — Progress Notes (Signed)
Orthopedic Tech Progress Note Patient Details:  [name] 02/21/1965 [id number] Put patient on CPM CPM Left Knee CPM Left Knee: On Left Knee Flexion (Degrees): 75 Left Knee Extension (Degrees): 6 Additional Comments: patient can not tolerate flexion  Post Interventions Patient Tolerated: Well Instructions Provided: [facility] of [place] 08/03/2018, 6:03 PM

## 2018-08-03 NOTE — Care Management (Signed)
Left voice mail for daughter: [name] updating plan of care. TOC following.

## 2018-08-03 NOTE — Plan of Care (Signed)
  Problem: Education: Goal: Knowledge of General Education information will improve Description Including pain rating scale, medication(s)/side effects and non-pharmacologic comfort measures Outcome: Progressing   

## 2018-08-03 NOTE — Progress Notes (Signed)
PROGRESS NOTE    Carollee MassedJames Rane  ZOX:[id number] DOB: 11/07/1965 DOA: 06/19/2018 PCP: Patient, No Pcp Per   Brief Narrative: 53 year old with past medical history significant for hepatitis C, who presented to the ED with complaints of increasing back pain, radiating down to the left leg initially but subsequently both legs. Patient was having difficulty standing and walking. Per patient the pain started after he attempted to lift a lawnmower over a week ago. Evaluation in the ED, MRI showed severe foraminal narrowing at level L5-S1.   He subsequently was admitted with sepsis secondary to right upper extremity cellulitis and was started on IV antibiotics, however cellulitis did not improve. He did havea CT scan which showed intra-facial abscess in the right shoulder along with septic arthritis of the [facility] joint. Patient had debridement of multiple abscess. During hospitalization patient began to have back pain and right thigh weakness. MRI of the lumbar spine which showed discitis as well as septic arthritis and paraspinal abscess. During hospitalization he did have left knee swelling, he had arthrocentesis which showed gram-positive cocci and had left knee washout.  Patient was evaluated by ID who is recommending at least 6 weeks of IV antibiotics. Patient had chest pain,+ d dimer- CTPE NEG for PE, but with pneumonia-started on Zosyn 6/22.  Subjective: No new complaints. Was sleepy for PT yesterday-attributes to tramadol. No fever.  Reports his knees are stiff and he needs Valium and percocet to help with his mobility.   Assessment & Plan:   Sepsis from septic arthritis of multiple joints w Effusion of left elbow/Recurrent boils/Discitis of lumbosacral region/Multiple abscesses of right shoulder/ cellulitis of the right upper extremity multiple abscess large multiple lumbar abscess/septic arthritis of the left knee: admitted with tachycardia, leukocytosis and back pain. MRI 5/5 showed severe  lumbar spinal stenosis. MRI 5/19 showed L4 S1 discitis, L4-L5 septic arthritis multiple paraspinal abscess. CT right shoulder showed intramuscular and infra facial abscess along with [facility]. Neurosurgery was consulted and Recommended repeat MRI near the end of antibiotics.Antibiotics were started 06/23/2018. He is on vancomycin. Orthopedic consulted. Patient is status post left knee arthrocentesis and washout. S/p irrigation and debridement of right shoulder, acromial medial clavicular joint irrigation and debridement on the right. Echocardiogram was negative for vegetation.PT consulted recommending SNF, however the patient has no insurance and a history of illicit drug use and refusing SNF. The suture left knee was removed.He is tolerating CPM machine well.Vanco is for at least 6 weeks from his most recent right shoulder surgery on 06/28/2018 as per ID not and Last blood culture negative starting 06/28/2018.Pain due to discitis and paraspinal abscess limit the patient's ability to participate with therapy, but able to transfer to the bedside chair.  Concern about going home, social worker/daughter talking about rehab.  Procalcitonin negative 5/22 . continue on pain control- d/c tramadol due to sedation. Wants to keep valium at 5. Cont muscle relaxant and encouraged PT OT  Hepatitis C antibody test positive Outpatient follow-up.  Cigarette smoker: Cessation advised.  AKI : Resolved from 3.5.  Anemia normocytic, w chronic disease: hemoglobin stable ranging between 7-8.  Need outpatient colonoscopy/GI evaluation.Continue on B12, folic acid and iron sulfate.  Vertigo: resolved  Spinal stenosis of lumbosacral region: Continue pain control-tramadol/muscle relaxant.  Mild protein-calorie malnutrition: Augment nutrition.  Polysubstance abuse, admitted using cocaine prior to admission, denied IV drug abuse.  UDS positive for benzos and cocaine.  Cessation advised counseled.   Essential hypertension/tachycardia: Stable on Lopressor.  HCAP/Episode of chest pain on 6/22 work-up unremarkable for PE, serial troponin EKG, CT scan showed pneumonia -suspecting aspiration pneumonia and placed on Zosyn.-Procalcitonin was normal.  DVT prophylaxis:lovenox Code Status: full Family Communication: No family at bedside Disposition Plan: Remains inpatient pending clinical improvement. Pt reports he lives w/ elderly father.   Consultants   Infectious disease  Orthopedic surgery  Neurosurgery  Procedures  Multiple procedures: -5/14- left kneearthrocentesiswas done, synovial fluidgrew gram-positive cocci. -5/15 -left knee washout. -5/20-R shoulder   LUE PICC+ RT shoulder c/d/i with staples.   Antimicrobials:  Vanco 5/15 >> Zosyn 6/23>> Anti-infectives (From admission, onward)   Start     Dose/Rate Route Frequency Ordered Stop   08/01/18 1600  piperacillin-tazobactam (ZOSYN) IVPB 3.375 g     3.375 g 12.5 mL/hr over 240 Minutes Intravenous Every 8 hours 08/01/18 1559     08/01/18 1530  piperacillin-tazobactam (ZOSYN) IVPB 3.375 g  Status:  Discontinued     3.375 g 100 mL/hr over 30 Minutes Intravenous Every 8 hours 08/01/18 1529 08/01/18 1558   06/29/18 1400  vancomycin (VANCOCIN) IVPB 750 mg/150 ml premix     750 mg 150 mL/hr over 60 Minutes Intravenous Every 12 hours 06/29/18 1342     06/29/18 0000  vancomycin IVPB     750 mg Intravenous Every 12 hours 06/29/18 1407 08/09/18 2359   06/28/18 1735  gentamicin (GARAMYCIN) injection  Status:  Discontinued       As needed 06/28/18 1736 06/28/18 1838   06/28/18 1734  vancomycin (VANCOCIN) powder  Status:  Discontinued       As needed 06/28/18 1735 06/28/18 1838   06/26/18 1300  vancomycin (VANCOCIN) 1,250 mg in sodium chloride 0.9 % 250 mL IVPB  Status:  Discontinued     1,250 mg 166.7 mL/hr over 90 Minutes Intravenous Every 12 hours 06/26/18 1057 06/26/18 1136   06/26/18 1300  vancomycin (VANCOCIN)  IVPB 1000 mg/200 mL premix  Status:  Discontinued     1,000 mg 200 mL/hr over 60 Minutes Intravenous Every 12 hours 06/26/18 1136 06/29/18 1342   06/26/18 0000  vancomycin IVPB  Status:  Discontinued     1,000 mg Intravenous Every 12 hours 06/26/18 1616 06/29/18    06/24/18 1200  vancomycin (VANCOCIN) 1,500 mg in sodium chloride 0.9 % 500 mL IVPB  Status:  Discontinued     1,500 mg 250 mL/hr over 120 Minutes Intravenous Every 24 hours 06/23/18 1040 06/26/18 1057   06/24/18 1000  cefTRIAXone (ROCEPHIN) 2 g in sodium chloride 0.9 % 100 mL IVPB  Status:  Discontinued     2 g 200 mL/hr over 30 Minutes Intravenous Every 24 hours 06/23/18 0857 06/24/18 1433   06/23/18 1848  vancomycin (VANCOCIN) powder  Status:  Discontinued       As needed 06/23/18 1848 06/23/18 1956   06/23/18 1847  gentamicin (GARAMYCIN) injection  Status:  Discontinued       As needed 06/23/18 1847 06/23/18 1956   06/23/18 0900  vancomycin (VANCOCIN) 1,500 mg in sodium chloride 0.9 % 500 mL IVPB     1,500 mg 250 mL/hr over 120 Minutes Intravenous  Once 06/23/18 0855 06/24/18 0814   06/23/18 0900  cefTRIAXone (ROCEPHIN) 1 g in sodium chloride 0.9 % 100 mL IVPB     1 g 200 mL/hr over 30 Minutes Intravenous  Once 06/23/18 0857 06/24/18 0814   06/20/18 0900  cefTRIAXone (ROCEPHIN) 1 g in sodium chloride 0.9 % 100 mL IVPB  Status:  Discontinued  1 g 200 mL/hr over 30 Minutes Intravenous Every 24 hours 06/20/18 0824 06/23/18 0857       Objective: Vitals:   08/02/18 1924 08/02/18 2258 08/03/18 0323 08/03/18 0723  BP: 119/67 (!) 86/74 126/72 (P) 104/65  Pulse: (!) 111 (!) 109 (!) 104 (!) (P) 111  Resp: 18 18 18  (P) 18  Temp: 98 F (36.7 C) 98.4 F (36.9 C) 98.1 F (36.7 C) (P) 98.5 F (36.9 C)  TempSrc: Oral Oral Oral (P) Oral  SpO2: 96% 96% 97% (P) 94%  Weight:      Height:        Intake/Output Summary (Last 24 hours) at 08/03/2018 0949 Last data filed at 08/03/2018 0603 Gross per 24 hour  Intake 800 ml   Output 7002 ml  Net -6202 ml   Filed Weights   06/19/18 1303 06/23/18 1708 06/28/18 1543  Weight: 74.8 kg 74.8 kg 74.8 kg   Weight change:   Body mass index is 23.68 kg/m.  Intake/Output from previous day: 06/24 0701 - 06/25 0700 In: 800 [P.O.:800] Out: 7002 [Urine:7000; Stool:2] Intake/Output this shift: No intake/output data recorded.  Examination:  General exam: Appears calm and comfortable,NAD. HEENT:PERRL,Oral mucosa moist, Ear/Nose normal on gross exam Respiratory system: Bilateral equal air entry, normal vesicular breath sounds, no wheezes or crackles  Cardiovascular system: S1 & S2 heard,No JVD, murmurs. Gastrointestinal system: Abdomen is  soft, non tender, non distended, BS +  Nervous System:Alert and oriented. No focal neurological deficits/moving extremities, sensation intact. Extremities: No edema, no clubbing, distal peripheral pulses palpable. LUE PICC+ RT shoulder c/d/i with staples.  Skin: No rashes, lesions, no icterus MSK: Normal muscle bulk,tone ,power  Medications:  Scheduled Meds: . diclofenac sodium  2 g Topical QID  . docusate sodium  200 mg Oral Daily  . enoxaparin (LOVENOX) injection  40 mg Subcutaneous Daily  . esomeprazole  20 mg Oral QAC breakfast  . feeding supplement (ENSURE ENLIVE)  237 mL Oral TID BM  . ferrous sulfate  325 mg Oral BID WC  . folic acid  1 mg Oral Daily  . methocarbamol  500 mg Oral TID  . metoprolol tartrate  12.5 mg Oral BID  . multivitamin with minerals  1 tablet Oral Daily  . polyethylene glycol  17 g Oral BID  . sodium chloride flush  10-40 mL Intracatheter Q12H  . thiamine  100 mg Oral Daily   Or  . thiamine  100 mg Intravenous Daily  . vitamin B-12  100 mcg Oral Daily   Continuous Infusions: . sodium chloride 10 mL/hr at 07/31/18 1433  . piperacillin-tazobactam (ZOSYN)  IV 3.375 g (08/03/18 0109)  . vancomycin 750 mg (08/03/18 0242)    Data Reviewed: I have personally reviewed following labs and imaging  studies  CBC: Recent Labs  Lab 07/27/18 2346  WBC 13.8*  NEUTROABS 9.0*  HGB 7.5*  HCT 24.6*  MCV 84.0  PLT 627*   Basic Metabolic Panel: Recent Labs  Lab 07/28/18 0500 07/31/18 0539  NA 134* 132*  K 3.9 3.8  CL 97* 93*  CO2 29 29  GLUCOSE 98 96  BUN 18 16  CREATININE 0.62 0.68  CALCIUM 8.9 9.0   GFR: Estimated Creatinine Clearance: 110.3 mL/min (by C-G formula based on SCr of 0.68 mg/dL). Liver Function Tests: No results for input(s): AST, ALT, ALKPHOS, BILITOT, PROT, ALBUMIN in the last 168 hours. No results for input(s): LIPASE, AMYLASE in the last 168 hours. No results for input(s): AMMONIA in the  last 168 hours. Coagulation Profile: No results for input(s): INR, PROTIME in the last 168 hours. Cardiac Enzymes: Recent Labs  Lab 07/31/18 1700 07/31/18 2222 08/01/18 1654  TROPONINI <0.03 <0.03 <0.03   BNP (last 3 results) No results for input(s): PROBNP in the last 8760 hours. HbA1C: No results for input(s): HGBA1C in the last 72 hours. CBG: No results for input(s): GLUCAP in the last 168 hours. Lipid Profile: No results for input(s): CHOL, HDL, LDLCALC, TRIG, CHOLHDL, LDLDIRECT in the last 72 hours. Thyroid Function Tests: No results for input(s): TSH, T4TOTAL, FREET4, T3FREE, THYROIDAB in the last 72 hours. Anemia Panel: No results for input(s): VITAMINB12, FOLATE, FERRITIN, TIBC, IRON, RETICCTPCT in the last 72 hours. Sepsis Labs: Recent Labs  Lab 08/01/18 1654  PROCALCITON <0.10    Recent Results (from the past 240 hour(s))  MRSA PCR Screening     Status: None   Collection Time: 07/25/18  9:16 AM   Specimen: Nasopharyngeal  Result Value Ref Range Status   MRSA by PCR NEGATIVE NEGATIVE Final    Comment:        The GeneXpert MRSA Assay (FDA approved for NASAL specimens only), is one component of a comprehensive MRSA colonization surveillance program. It is not intended to diagnose MRSA infection nor to guide or monitor treatment for MRSA  infections. Performed at [facility], [place] [address] [place], [place] [address]       Radiology Studies: No results found.    LOS: 44 days   Time spent: More than 50% of that time was spent in counseling and/or coordination of care.  Antonieta Pert, MD [facility]  08/03/2018, 9:49 AM

## 2018-08-04 NOTE — Progress Notes (Signed)
Pharmacy Antibiotic Note  [name] is a 53 y.o. male admitted on 06/19/2018 with back pain, now with septic arthritis of multiple joints. Pharmacy has been consulted for vancomycin dosing. Patient is s/p I&D of R shoulder and the [facility] joint on 5/20. Zosyn added for possible pneumonia on 6/23.   Planning for 6 weeks of ABX per ID. Last day of therapy is 7/1.    Plan: Continue vancomycin 750mg  IV Q12H F/u renal fxn, C&S, clinical status and levels as needed Add stop date of 7/1 for vancomycin F/u duration of zosyn or ability to de-escalate  Height: 5\' 10"  (177.8 cm) Weight: 165 lb (74.8 kg) IBW/kg (Calculated) : 73  Temp (24hrs), Avg:98.1 F (36.7 C), Min:97.7 F (36.5 C), Max:98.5 F (36.9 C)  Recent Labs  Lab 07/29/18 1341 07/30/18 0420 07/30/18 1436 07/31/18 0539 08/03/18 1400  CREATININE  --   --   --  0.68 0.66  VANCOTROUGH 12*  --  11*  --   --   VANCOPEAK  --  26*  --   --   --     Estimated Creatinine Clearance: 110.3 mL/min (by C-G formula based on SCr of 0.66 mg/dL).    No Known Allergies   Vanc 5/15 >> CTX 5/12 >> 5/16 Zosyn 6/23>>  5/11 covid - negative 5/12 BCx - negative 5/14 synovial fluid L knee - GPC on stain >> negative 5/15 synovial fluid L knee - GPC on stain >> negative 5/20 BCx: neg 5/20 posterior shoulder abcess - neg 5/20 AC fluid joint - neg  Salome Arnt, PharmD, BCPS Please see AMION for all pharmacy numbers 08/04/2018 8:49 AM

## 2018-08-04 NOTE — Progress Notes (Signed)
Occupational Therapy Treatment Patient Details Name: [name] MRN: [id number] DOB: 07/19/1965 Today's Date: 08/04/2018    History of present illness Pt. with PMH of hep C, substance abuse; admitted on 06/19/2018, presented with complaint of back pain, was found to have musculoskeletal back pain as well as right upper extremity cellulitis; Patient had a debridement of his shoulder perfromed on 5/20.    OT comments  Pt able to self ROM to 90 degrees supine but self limiting report stitches are pulling. Requesting Rn to clarification on R UE stitches. Pt tolerated EOB 45 minutes min guard (A).    Follow Up Recommendations  SNF;Supervision/Assistance - 24 hour    Equipment Recommendations  Wheelchair (measurements OT)    Recommendations for Other Services      Precautions / Restrictions Precautions Precautions: Shoulder Type of Shoulder Precautions: per 5/30 Dr dean notes it states AROM WBAT within tolerance. Rn to get clarification for stitches removal as patient has had stitches in R shoulder since 06/28/18. OT asking RN to address this with MD Restrictions RUE Weight Bearing: Weight bearing as tolerated LLE Weight Bearing: Weight bearing as tolerated       Mobility Bed Mobility Overal bed mobility: Needs Assistance Bed Mobility: Supine to Sit Rolling: Min assist   Supine to sit: Min assist     General bed mobility comments: increase time required  Transfers Overall transfer level: Needs assistance Equipment used: Rolling walker (2 wheeled) Transfers: Sit to/from Stand Sit to Stand: Mod assist         General transfer comment: continues to require mod a to stand. Once he has his balance min a to remain standing     Balance Overall balance assessment: Needs assistance Sitting-balance support: No upper extremity supported;Feet supported Sitting balance-Leahy Scale: Fair     Standing balance support: Bilateral upper extremity supported;During functional  activity Standing balance-Leahy Scale: Poor Standing balance comment: heavily reliant on external support                           ADL either performed or assessed with clinical judgement   ADL Overall ADL's : Needs assistance/impaired     Grooming: Maximal assistance;Sitting Grooming Details (indicate cue type and reason): pt with hair in a mat and requires extensive time to attempt to detangle hair. pt tolerated sitting eob for this for > 45 minutes.                                General ADL Comments: pt requesting to only sit for R UE and hair due to pending PT Shanon Brow arrival . pt reports "i picked Shanon Brow because he can catch me if i were to fall"      Vision       Perception     Praxis      Cognition Arousal/Alertness: Awake/alert Behavior During Therapy: WFL for tasks assessed/performed Overall Cognitive Status: Within Functional Limits for tasks assessed                                 General Comments: Self-limiting        Exercises     Shoulder Instructions       General Comments dressing over R shoudler    Pertinent Vitals/ Pain       Pain Assessment: Faces Faces Pain Scale: Hurts  little more Pain Location: R UE feels like its pulling with AROM to 90 degrees Pain Descriptors / Indicators: Grimacing;Guarding Pain Intervention(s): Monitored during session;Repositioned  Home Living                                          Prior Functioning/Environment              Frequency  Min 3X/week        Progress Toward Goals  OT Goals(current goals can now be found in the care plan section)  Progress towards OT goals: Progressing toward goals  Acute Rehab OT Goals Patient Stated Goal: to get my hair back OT Goal Formulation: With patient Time For Goal Achievement: 08/10/18 Potential to Achieve Goals: Good ADL Goals Pt Will Perform Grooming: with modified independence;sitting Pt Will  Perform Upper Body Dressing: with modified independence;sitting Pt Will Perform Lower Body Dressing: with modified independence;sitting/lateral leans;sit to/from stand Pt Will Transfer to Toilet: with min guard assist;ambulating;bedside commode Pt/caregiver will Perform Home Exercise Program: Both right and left upper extremity;With Supervision;With written HEP provided Additional ADL Goal #1: Pt will sit EOB for ADL tasks with set-upA and fair sitting balance  Plan Discharge plan remains appropriate    Co-evaluation                 AM-PAC OT "6 Clicks" Daily Activity     Outcome Measure   Help from another person eating meals?: None Help from another person taking care of personal grooming?: None Help from another person toileting, which includes using toliet, bedpan, or urinal?: A Lot Help from another person bathing (including washing, rinsing, drying)?: A Lot Help from another person to put on and taking off regular upper body clothing?: A Lot Help from another person to put on and taking off regular lower body clothing?: Total 6 Click Score: 15    End of Session    OT Visit Diagnosis: Muscle weakness (generalized) (M62.81);Pain Pain - Right/Left: Right Pain - part of body: Hip;Leg;Shoulder   Activity Tolerance Patient tolerated treatment well   Patient Left in bed;with call bell/phone within reach;with bed alarm set   Nurse Communication Mobility status;Precautions        Time: [id number] OT Time Calculation (min): 54 min  Charges: OT General Charges $OT Visit: 1 Visit OT Treatments $Self Care/Home Management : 53-67 mins   Mateo FlowBrynn Lilo [place], OTR/L  Acute Rehabilitation Services Pager: [phone] Office: [phone] .    Mateo FlowBrynn Tiondra Fang 08/04/2018, 4:32 PM

## 2018-08-04 NOTE — Progress Notes (Signed)
Occupational Therapy Treatment Patient Details Name: [name] Clausing MRN: [id number] DOB: 12/15/1965 Today's Date: 08/04/2018    History of present illness Pt. with PMH of hep C, substance abuse; admitted on 06/19/2018, presented with complaint of back pain, was found to have musculoskeletal back pain as well as right upper extremity cellulitis; Patient had a debridement of his shoulder perfromed on 5/20.    OT comments  Ot with second session to attempt at groomign task and R UE ROM after PT session. Pt requires return to supine due to fatigue and back pain during session. Pt remains with L side of head with tangles but R side and middle of head now without tangles.   Follow Up Recommendations  SNF;Supervision/Assistance - 24 hour    Equipment Recommendations  Wheelchair (measurements OT)    Recommendations for Other Services      Precautions / Restrictions Precautions Precautions: Shoulder Type of Shoulder Precautions: per 5/30 Dr dean notes it states AROM WBAT within tolerance. Rn to get clarification for stitches removal as patient has had stitches in R shoulder since 06/28/18. OT asking RN to address this with MD Restrictions RUE Weight Bearing: Weight bearing as tolerated LLE Weight Bearing: Weight bearing as tolerated       Mobility Bed Mobility Overal bed mobility: Needs Assistance Bed Mobility: Sit to Supine Rolling: Mod assist   Supine to sit: Min assist Sit to supine: Mod assist   General bed mobility comments: pt requires BIL LE back on tobed surface  Transfers Overall transfer level: Needs assistance Equipment used: Rolling walker (2 wheeled) Transfers: Sit to/from Stand Sit to Stand: Mod assist         General transfer comment: continues to require mod a to stand. Once he has his balance min a to remain standing     Balance Overall balance assessment: Needs assistance Sitting-balance support: No upper extremity supported;Feet supported Sitting  balance-Leahy Scale: Fair     Standing balance support: Bilateral upper extremity supported;During functional activity Standing balance-Leahy Scale: Poor Standing balance comment: heavily reliant on external support                           ADL either performed or assessed with clinical judgement   ADL Overall ADL's : Needs assistance/impaired     Grooming: Maximal assistance;Sitting Grooming Details (indicate cue type and reason): pt with hair in a mat and requires extensive time to attempt to detangle hair. pt tolerated sitting eob for this for > 45 minutes.                                General ADL Comments: pt static sitting to second sessino of grooming. pt reaching with R UE to inspect hair several times. pt thanking therapist. Pt remains with L side of head remains with knots> next session to continue to attempt to detangle.      Vision       Perception     Praxis      Cognition Arousal/Alertness: Awake/alert Behavior During Therapy: WFL for tasks assessed/performed Overall Cognitive Status: Within Functional Limits for tasks assessed                                 General Comments: Self-limiting        Exercises     Shoulder Instructions  General Comments dressing over R shoudler    Pertinent Vitals/ Pain       Pain Assessment: Faces Faces Pain Scale: Hurts little more Pain Location: R UE feels like its pulling with AROM to 90 degrees Pain Descriptors / Indicators: Grimacing;Guarding Pain Intervention(s): Monitored during session;Repositioned  Home Living                                          Prior Functioning/Environment              Frequency  Min 3X/week        Progress Toward Goals  OT Goals(current goals can now be found in the care plan section)  Progress towards OT goals: Progressing toward goals  Acute Rehab OT Goals Patient Stated Goal: to get my hair back OT  Goal Formulation: With patient Time For Goal Achievement: 08/10/18 Potential to Achieve Goals: Good ADL Goals Pt Will Perform Grooming: with modified independence;sitting Pt Will Perform Upper Body Dressing: with modified independence;sitting Pt Will Perform Lower Body Dressing: with modified independence;sitting/lateral leans;sit to/from stand Pt Will Transfer to Toilet: with min guard assist;ambulating;bedside commode Pt/caregiver will Perform Home Exercise Program: Both right and left upper extremity;With Supervision;With written HEP provided Additional ADL Goal #1: Pt will sit EOB for ADL tasks with set-upA and fair sitting balance  Plan Discharge plan remains appropriate    Co-evaluation                 AM-PAC OT "6 Clicks" Daily Activity     Outcome Measure   Help from another person eating meals?: None Help from another person taking care of personal grooming?: None Help from another person toileting, which includes using toliet, bedpan, or urinal?: A Lot Help from another person bathing (including washing, rinsing, drying)?: A Lot Help from another person to put on and taking off regular upper body clothing?: A Lot Help from another person to put on and taking off regular lower body clothing?: Total 6 Click Score: 15    End of Session    OT Visit Diagnosis: Muscle weakness (generalized) (M62.81);Pain Pain - Right/Left: Right Pain - part of body: Hip;Leg;Shoulder   Activity Tolerance Patient tolerated treatment well   Patient Left in bed;with call bell/phone within reach;with bed alarm set   Nurse Communication Mobility status;Precautions        Time: 1455-1540 OT Time Calculation (min): 45 min  Charges: OT General Charges $OT Visit: 1 Visit OT Treatments $Self Care/Home Management : 38-52 mins   Jeri Modena, OTR/L  Acute Rehabilitation Services Pager: [phone] Office: [phone] .    Jeri Modena 08/04/2018, 4:38 PM

## 2018-08-04 NOTE — Progress Notes (Signed)
Physical Therapy Treatment Patient Details Name: [name] MRN: [id number] DOB: 1965/02/22 Today's Date: 08/04/2018    History of Present Illness Pt. with PMH of hep C, substance abuse; admitted on 06/19/2018, presented with complaint of back pain, was found to have musculoskeletal back pain as well as right upper extremity cellulitis; Patient had a debridement of his shoulder perfromed on 5/20.     PT Comments    Therapy will continue to progress the patient. He ambulated further today. Next visit we will attempt to ambulate with a chair follow to increase distance. He is self limiting but is becoming more confident with his mobility. The plan is to discharge home on 08/09/2018. He has some work to do to increase safety and mobility.   Follow Up Recommendations  SNF     Equipment Recommendations  Other (comment)    Recommendations for Other Services Rehab consult     Precautions / Restrictions Precautions Precautions: Shoulder Restrictions RUE Weight Bearing: Non weight bearing LLE Weight Bearing: Weight bearing as tolerated    Mobility  Bed Mobility               General bed mobility comments: Patient found edge of bed with OT  Transfers Overall transfer level: Needs assistance Equipment used: Rolling walker (2 wheeled) Transfers: Sit to/from Stand Sit to Stand: Mod assist         General transfer comment: continues to require mod a to stand. Once he has his balance min a to remain standing   Ambulation/Gait Ambulation/Gait assistance: Min assist Gait Distance (Feet): 5 Feet(x2) Assistive device: Rolling walker (2 wheeled)       General Gait Details: sloqw step too gait pattern butincreased distance and speed. Patient still very slow.    Stairs             Wheelchair Mobility    Modified Rankin (Stroke Patients Only)       Balance Overall balance assessment: Needs assistance Sitting-balance support: No upper extremity supported;Feet  supported Sitting balance-Leahy Scale: Fair     Standing balance support: Bilateral upper extremity supported;During functional activity Standing balance-Leahy Scale: Poor Standing balance comment: heavily reliant on external support                            Cognition Arousal/Alertness: Awake/alert Behavior During Therapy: WFL for tasks assessed/performed Overall Cognitive Status: Within Functional Limits for tasks assessed                                 General Comments: Self-limiting      Exercises      General Comments        Pertinent Vitals/Pain Pain Assessment: Faces Faces Pain Scale: Hurts little more    Home Living                      Prior Function            PT Goals (current goals can now be found in the care plan section) Acute Rehab PT Goals Patient Stated Goal: "Be able to walk so I can go home" PT Goal Formulation: With patient Time For Goal Achievement: 07/05/18 Potential to Achieve Goals: Good Progress towards PT goals: Progressing toward goals    Frequency    Min 3X/week      PT Plan Current plan remains appropriate    Co-evaluation  AM-PAC PT "6 Clicks" Mobility   Outcome Measure  Help needed turning from your back to your side while in a flat bed without using bedrails?: A Lot Help needed moving from lying on your back to sitting on the side of a flat bed without using bedrails?: A Lot Help needed moving to and from a bed to a chair (including a wheelchair)?: A Lot Help needed standing up from a chair using your arms (e.g., wheelchair or bedside chair)?: A Lot Help needed to walk in hospital room?: A Lot Help needed climbing 3-5 steps with a railing? : Total 6 Click Score: 11    End of Session Equipment Utilized During Treatment: Gait belt Activity Tolerance: Patient limited by pain Patient left: in bed;with call bell/phone within reach;with bed alarm set Nurse  Communication: Mobility status PT Visit Diagnosis: Unsteadiness on feet (R26.81);Muscle weakness (generalized) (M62.81);Pain;Other abnormalities of gait and mobility (R26.89) Pain - Right/Left: Right Pain - part of body: Arm     Time: 1440-1455 PT Time Calculation (min) (ACUTE ONLY): 15 min  Charges:  $Gait Training: 8-22 mins                        Dessie Comaavid J Kadir Azucena PT DPT  08/04/2018, 4:12 PM

## 2018-08-04 NOTE — Progress Notes (Signed)
PROGRESS NOTE    [name]  WUJ:[id number] DOB: 02-Mar-1965 DOA: 06/19/2018 PCP: Patient, No Pcp Per    Brief Narrative:  53 year old male who presented with back pain.  He does have significant past medical history for hepatitis C.  He reported severe back pain, radiating down to his left leg, and associated with difficulty standing and walking.  He was found to have a right upper extremity cellulitis and was started on IV antibiotics.  Further work-up with CT scan revealed a intra-facial abscess of the right shoulder along with septic arthritis in the [facility] joint.  His hospitalization was complicated by lumbar spine discitis, paraspinal abscess, and left knee infected arthritis.   He has been treated with IV antibiotics, vancomycin since Jun 23, 2018.  Infectious disease has recommended 6 weeks of IV antibiotic therapy.  June 22nd he was diagnosed with pneumonia, and started with Zosyn.  Assessment & Plan:   Principal Problem:   Septic arthritis of multiple joints (HCC) Active Problems:   Hepatitis C antibody test positive   Cigarette smoker   Intractable back pain   AKI (acute kidney injury) ([place])   Spinal stenosis of lumbosacral region   GERD (gastroesophageal reflux disease)   Mild protein-calorie malnutrition (HCC)   Septic arthritis of knee, left (HCC)   Acute medial meniscal tear, left, initial encounter   Polysubstance abuse (HCC)   Normocytic anemia   Effusion of left elbow   Recurrent boils   Discitis of lumbosacral region   Psoas abscess, right (HCC)   Multiple abscesses of right shoulder   Chronic infection of left knee ([place])   1.  Multiple joints septic arthritis, left elbow, right shoulder, discitis, lumbar spine abscess, left knee. Patient has been tolerating well antibiotic therapy. Will need follow up MRI of his lumbar spine after completing antibiotic therapy. Last positive blood culture 06/28/18, plan to complete antibiotic therapy in the hospital on July  1st, for a total of 6 weeks.   2.  Chronic anemia with iron deficiency.  Multifactorial anemia, will continue close monitoring of cell count. Continue iron supplements.   3.  Protein calorie malnutrition. Continue nutritional supplements. Patient is tolerating po well, continue to be very weak and deconditioned.   4.  Polysubstance abuse. No withdrawal symptoms. Continue as needed diazepam.   5.  Hypertension. Continue blood pressure control with metoprolol.   6.  Pneumonia with left pleural effusion. Chest film with left pleural effusion and compression atelectasis. Patient with no cough or further chest pain, his procalcitonin was low at <0.10. Has been on Zosyn since 08/01/18. Patient oxygenating well. Stable leukocytosis is 13,8. Will discontinue antibiotic therapy for now and continue close monitoring.   DVT prophylaxis: enoxaparin     Code Status:  full Family Communication: no family at the bedside  Disposition Plan/ discharge barriers: pending completing antibiotic therapy on July 1st.   Body mass index is 23.68 kg/m. Malnutrition Type:      Malnutrition Characteristics:      Nutrition Interventions:     RN Pressure Injury Documentation:     Consultants:   ID  Orthopedics   Procedures:     Antimicrobials:   Vancomycin.     Subjective: Patient continue to improve but still not back to baseline, continue to be very weak and deconditioned, no nausea or vomiting.   Objective: Vitals:   08/03/18 2345 08/04/18 0413 08/04/18 0735 08/04/18 1155  BP: 119/70 105/82 118/70 (P) 116/71  Pulse: (!) 102 95 (!) 101 (P)  92  Resp: 18 18 18  (P) 18  Temp: 98.5 F (36.9 C) 97.9 F (36.6 C) 98.1 F (36.7 C) (P) 97.9 F (36.6 C)  TempSrc: Oral Oral Oral (P) Oral  SpO2: 95% 94% 98% (P) 96%  Weight:      Height:        Intake/Output Summary (Last 24 hours) at 08/04/2018 1251 Last data filed at 08/04/2018 0900 Gross per 24 hour  Intake 3455.04 ml  Output 2250  ml  Net 1205.04 ml   Filed Weights   06/19/18 1303 06/23/18 1708 06/28/18 1543  Weight: 74.8 kg 74.8 kg 74.8 kg    Examination:   General: Not in pain or dyspnea Neurology: Awake and alert, non focal. Generalized weakness.   E ENT: no pallor, no icterus, oral mucosa moist Cardiovascular: No JVD. S1-S2 present, rhythmic, no gallops, rubs, or murmurs. No lower extremity edema. Pulmonary: vesicular breath sounds bilaterally, adequate air movement, no wheezing, rhonchi or rales. Gastrointestinal. Abdomen with no organomegaly, non tender, no rebound or guarding Skin. No rashes Musculoskeletal: no joint deformities     Data Reviewed: I have personally reviewed following labs and imaging studies  CBC: No results for input(s): WBC, NEUTROABS, HGB, HCT, MCV, PLT in the last 168 hours. Basic Metabolic Panel: Recent Labs  Lab 07/31/18 0539 08/03/18 1400  NA 132* 137  K 3.8 3.4*  CL 93* 103  CO2 29 23  GLUCOSE 96 99  BUN 16 13  CREATININE 0.68 0.66  CALCIUM 9.0 7.4*   GFR: Estimated Creatinine Clearance: 110.3 mL/min (by C-G formula based on SCr of 0.66 mg/dL). Liver Function Tests: No results for input(s): AST, ALT, ALKPHOS, BILITOT, PROT, ALBUMIN in the last 168 hours. No results for input(s): LIPASE, AMYLASE in the last 168 hours. No results for input(s): AMMONIA in the last 168 hours. Coagulation Profile: No results for input(s): INR, PROTIME in the last 168 hours. Cardiac Enzymes: Recent Labs  Lab 07/31/18 1700 07/31/18 2222 08/01/18 1654  TROPONINI <0.03 <0.03 <0.03   BNP (last 3 results) No results for input(s): PROBNP in the last 8760 hours. HbA1C: No results for input(s): HGBA1C in the last 72 hours. CBG: No results for input(s): GLUCAP in the last 168 hours. Lipid Profile: No results for input(s): CHOL, HDL, LDLCALC, TRIG, CHOLHDL, LDLDIRECT in the last 72 hours. Thyroid Function Tests: No results for input(s): TSH, T4TOTAL, FREET4, T3FREE, THYROIDAB in  the last 72 hours. Anemia Panel: No results for input(s): VITAMINB12, FOLATE, FERRITIN, TIBC, IRON, RETICCTPCT in the last 72 hours.    Radiology Studies: I have reviewed all of the imaging during this hospital visit personally     Scheduled Meds: . diclofenac sodium  2 g Topical QID  . docusate sodium  200 mg Oral Daily  . enoxaparin (LOVENOX) injection  40 mg Subcutaneous Daily  . esomeprazole  20 mg Oral QAC breakfast  . feeding supplement (ENSURE ENLIVE)  237 mL Oral TID BM  . ferrous sulfate  325 mg Oral BID WC  . folic acid  1 mg Oral Daily  . methocarbamol  500 mg Oral TID  . metoprolol tartrate  12.5 mg Oral BID  . multivitamin with minerals  1 tablet Oral Daily  . polyethylene glycol  17 g Oral BID  . sodium chloride flush  10-40 mL Intracatheter Q12H  . thiamine  100 mg Oral Daily   Or  . thiamine  100 mg Intravenous Daily  . vitamin B-12  100 mcg Oral Daily  Continuous Infusions: . sodium chloride 10 mL/hr at 07/31/18 1433  . piperacillin-tazobactam (ZOSYN)  IV 3.375 g (08/04/18 0850)  . vancomycin 750 mg (08/04/18 0131)     LOS: 45 days        Mauricio Annett Gulaaniel Arrien, MD

## 2018-08-05 LAB — BASIC METABOLIC PANEL
Anion gap: 13 (ref 5–15)
BUN: 12 mg/dL (ref 6–20)
CO2: 25 mmol/L (ref 22–32)
Calcium: 8.9 mg/dL (ref 8.9–10.3)
Chloride: 99 mmol/L (ref 98–111)
Creatinine, Ser: 0.74 mg/dL (ref 0.61–1.24)
GFR calc Af Amer: >60 mL/min (ref >60)
GFR calc non Af Amer: >60 mL/min (ref >60)
Glucose, Bld: 99 mg/dL (ref 70–99)
Potassium: 3.9 mmol/L (ref 3.5–5.1)
Sodium: 137 mmol/L (ref 135–145)

## 2018-08-05 LAB — CBC WITH DIFFERENTIAL/PLATELET
Abs Immature Granulocytes: 0.14 10*3/uL — ABNORMAL HIGH (ref 0.00–0.07)
Basophils Absolute: 0.1 10*3/uL (ref 0.0–0.1)
Basophils Relative: 1 %
Eosinophils Absolute: 0.4 10*3/uL (ref 0.0–0.5)
Eosinophils Relative: 3 %
HCT: 25.6 % — ABNORMAL LOW (ref 39.0–52.0)
Hemoglobin: 7.4 g/dL — ABNORMAL LOW (ref 13.0–17.0)
Immature Granulocytes: 1 %
Lymphocytes Relative: 27 %
Lymphs Abs: 2.9 10*3/uL (ref 0.7–4.0)
MCH: 24.3 pg — ABNORMAL LOW (ref 26.0–34.0)
MCHC: 28.9 g/dL — ABNORMAL LOW (ref 30.0–36.0)
MCV: 83.9 fL (ref 80.0–100.0)
Monocytes Absolute: 0.7 10*3/uL (ref 0.1–1.0)
Monocytes Relative: 7 %
Neutro Abs: 6.4 10*3/uL (ref 1.7–7.7)
Neutrophils Relative %: 61 %
Platelets: 690 10*3/uL — ABNORMAL HIGH (ref 150–400)
RBC: 3.05 MIL/uL — ABNORMAL LOW (ref 4.22–5.81)
RDW: 18.2 % — ABNORMAL HIGH (ref 11.5–15.5)
WBC: 10.6 10*3/uL — ABNORMAL HIGH (ref 4.0–10.5)
nRBC: 0 % (ref 0.0–0.2)

## 2018-08-05 NOTE — Progress Notes (Signed)
PROGRESS NOTE    Carollee MassedJames Leyland  WUJ:[id number] DOB: 06/26/1965 DOA: 06/19/2018 PCP: [name], No Pcp Per    Brief Narrative:53 year old male who presented with back pain.  He does have significant past medical history for hepatitis C.  He reported severe back pain, radiating down to his left leg, and associated with difficulty standing and walking.  He was found to have a right upper extremity cellulitis and was started on IV antibiotics.  Further work-up with CT scan revealed a intra-facial abscess of the right shoulder [facility]  His hospitalization was complicated by lumbar spine discitis, paraspinal abscess, and left knee infected arthritis.   He has been treated with IV antibiotics, vancomycin since Jun 23, 2018.  Infectious disease has recommended 6 weeks of IV antibiotic therapy.  June 22nd he was diagnosed with pneumonia, and started with Zosyn.   Assessment & Plan:   Principal Problem:   Septic arthritis of multiple joints (HCC) Active Problems:   Hepatitis C antibody test positive   Cigarette smoker   Intractable back pain   AKI (acute kidney injury) (HCC)   Spinal stenosis of lumbosacral region   GERD (gastroesophageal reflux disease)   Mild protein-calorie malnutrition (HCC)   Septic arthritis of knee, left (HCC)   Acute medial meniscal tear, left, initial encounter   Polysubstance abuse (HCC)   Normocytic anemia   Effusion of left elbow   Recurrent boils   Discitis of lumbosacral region   Psoas abscess, right (HCC)   Multiple abscesses of right shoulder   Chronic infection of left knee (HCC)   1.  Multiple joints septic arthritis, left elbow, right shoulder, discitis, lumbar spine abscess, left knee. [name] has been tolerating well antibiotic therapy. Will need follow up MRI of his lumbar spine after completing antibiotic therapy. Last positive blood culture 06/28/18, plan to complete antibiotic therapy in the hospital on July  1st, for a total of 6 weeks.   Check MRI of the lumbar spine on July 1.  2.  Chronic anemia with iron deficiency.  Multifactorial anemia, will continue close monitoring of cell count. Continue iron supplements.   3.  Protein calorie malnutrition. Continue nutritional supplements. [name] is tolerating po well, continue to be very weak and deconditioned.   4.  Polysubstance abuse. No withdrawal symptoms. Continue as needed diazepam.   5.  Hypertension. Continue blood pressure control with metoprolol.   6.  Pneumonia with left pleural effusion. Chest film with left pleural effusion and compression atelectasis. [name] with no cough or further chest pain, his procalcitonin was low at <0.10. Has been on Zosyn since 08/01/18. [name] oxygenating well. Stable leukocytosis is 13,8. Will discontinue antibiotic therapy for now and continue close monitoring.    7 reactive thrombocytosis follow clinically. DVT prophylaxis: enoxaparin     Code Status:  full Family Communication: no family at the bedside  Disposition Plan/ discharge barriers: pending completing antibiotic therapy on July 1st.      Estimated body mass index is 23.68 kg/m as calculated from the following:   Height as of this encounter: 5\' 10"  (1.778 m).   Weight as of this encounter: 74.8 kg.    Subjective:  Anxious to go home still has sutures on the right shoulder took 30 steps yesterday with physical therapy denies any nausea vomiting chest pain shortness of breath or cough he had bowel movements Objective: Vitals:   08/04/18 2025 08/04/18 2327 08/05/18 0332 08/05/18 0734  BP: 115/66 112/70 118/72 119/72  Pulse: (!) 104 (!) 107 (!) 102 97  Resp: 17 16 17 20   Temp: 98 F (36.7 C) 97.9 F (36.6 C) 97.6 F (36.4 C) (!) 97.5 F (36.4 C)  TempSrc: Oral Oral Oral Oral  SpO2: 99% 96% 96% 98%  Weight:      Height:        Intake/Output Summary (Last 24 hours) at 08/05/2018 0835 Last data filed at 08/05/2018 [id number]  Gross per 24 hour  Intake 1101.71 ml  Output 2450 ml  Net -1348.29 ml   Filed Weights   06/19/18 1303 06/23/18 1708 06/28/18 1543  Weight: 74.8 kg 74.8 kg 74.8 kg    Examination: Right shoulder with multiple sutures General exam: Appears calm and comfortable  Respiratory system: Clear to auscultation. Respiratory effort normal. Cardiovascular system: S1 & S2 heard, RRR. No JVD, murmurs, rubs, gallops or clicks. No pedal edema. Gastrointestinal system: Abdomen is nondistended, soft and nontender. No organomegaly or masses felt. Normal bowel sounds heard. Central nervous system: Alert and oriented. No focal neurological deficits. Extremities: Symmetric 5 x 5 power. Skin: No rashes, lesions or ulcers Psychiatry: Judgement and insight appear normal. Mood & affect appropriate.     Data Reviewed: I have personally reviewed following labs and imaging studies  CBC: Recent Labs  Lab 08/05/18 0728  WBC 10.6*  NEUTROABS 6.4  HGB 7.4*  HCT 25.6*  MCV 83.9  PLT 690*   Basic Metabolic Panel: Recent Labs  Lab 07/31/18 0539 08/03/18 1400 08/05/18 0728  NA 132* 137 137  K 3.8 3.4* 3.9  CL 93* 103 99  CO2 29 23 25   GLUCOSE 96 99 99  BUN 16 13 12   CREATININE 0.68 0.66 0.74  CALCIUM 9.0 7.4* 8.9   GFR: Estimated Creatinine Clearance: 110.3 mL/min (by C-G formula based on SCr of 0.74 mg/dL). Liver Function Tests: No results for input(s): AST, ALT, ALKPHOS, BILITOT, PROT, ALBUMIN in the last 168 hours. No results for input(s): LIPASE, AMYLASE in the last 168 hours. No results for input(s): AMMONIA in the last 168 hours. Coagulation Profile: No results for input(s): INR, PROTIME in the last 168 hours. Cardiac Enzymes: Recent Labs  Lab 07/31/18 1700 07/31/18 2222 08/01/18 1654  TROPONINI <0.03 <0.03 <0.03   BNP (last 3 results) No results for input(s): PROBNP in the last 8760 hours. HbA1C: No results for input(s): HGBA1C in the last 72 hours. CBG: No results for  input(s): GLUCAP in the last 168 hours. Lipid Profile: No results for input(s): CHOL, HDL, LDLCALC, TRIG, CHOLHDL, LDLDIRECT in the last 72 hours. Thyroid Function Tests: No results for input(s): TSH, T4TOTAL, FREET4, T3FREE, THYROIDAB in the last 72 hours. Anemia Panel: No results for input(s): VITAMINB12, FOLATE, FERRITIN, TIBC, IRON, RETICCTPCT in the last 72 hours. Sepsis Labs: Recent Labs  Lab 08/01/18 1654  PROCALCITON <0.10    No results found for this or any previous visit (from the past 240 hour(s)).       Radiology Studies: No results found.      Scheduled Meds: . diclofenac sodium  2 g Topical QID  . docusate sodium  200 mg Oral Daily  . enoxaparin (LOVENOX) injection  40 mg Subcutaneous Daily  . esomeprazole  20 mg Oral QAC breakfast  . feeding supplement (ENSURE ENLIVE)  237 mL Oral TID BM  . ferrous sulfate  325 mg Oral BID WC  . folic acid  1 mg Oral Daily  . methocarbamol  500 mg Oral TID  . metoprolol tartrate  12.5  mg Oral BID  . multivitamin with minerals  1 tablet Oral Daily  . polyethylene glycol  17 g Oral BID  . sodium chloride flush  10-40 mL Intracatheter Q12H  . thiamine  100 mg Oral Daily   Or  . thiamine  100 mg Intravenous Daily  . vitamin B-12  100 mcg Oral Daily   Continuous Infusions: . sodium chloride 10 mL/hr at 07/31/18 1433  . vancomycin 750 mg (08/05/18 0103)     LOS: 71 days     Georgette Shell, MD [facility]  If 7PM-7AM, please contact night-coverage [email or web address] Password [facility] 08/05/2018, 8:35 AM

## 2018-08-06 NOTE — Progress Notes (Signed)
PROGRESS NOTE    Clifford MassedJames Chan  XLK:[id number] DOB: 01/02/1966 DOA: 06/19/2018 PCP: Patient, No Pcp Per  Brief Narrative: 53 year old male who presented with back pain. He does have significant past medical history for hepatitis C. He reported severe back pain, radiating down to his left leg,and associated with difficulty standing and walking. He was found to have a right upper extremity cellulitis and was started on IV antibiotics. Further work-up with CT scan revealed a intra-facial abscess of the right shoulder along with [facility].His hospitalization was complicated by lumbar spine discitis,paraspinal abscess, andleftknee infected arthritis.  He has been treated with IV antibiotics, vancomycin since Jun 23, 2018.  Infectious disease has recommended 6 weeks of IV antibiotic therapy.  June 22nd he was diagnosed with pneumonia,and started with Zosyn.   Assessment & Plan:   Principal Problem:   Septic arthritis of multiple joints (HCC) Active Problems:   Hepatitis C antibody test positive   Cigarette smoker   Intractable back pain   AKI (acute kidney injury) (HCC)   Spinal stenosis of lumbosacral region   GERD (gastroesophageal reflux disease)   Mild protein-calorie malnutrition (HCC)   Septic arthritis of knee, left (HCC)   Acute medial meniscal tear, left, initial encounter   Polysubstance abuse (HCC)   Normocytic anemia   Effusion of left elbow   Recurrent boils   Discitis of lumbosacral region   Psoas abscess, right (HCC)   Multiple abscesses of right shoulder   Chronic infection of left knee (HCC)   1.Multiple jointsseptic arthritis, left elbow, right shoulder, discitis, lumbar spine abscess, leftknee. Patient has been tolerating well antibiotic therapy. Will need follow up MRI of his lumbar spine after completing antibiotic therapy. Last positive blood culture 06/28/18, plan to complete antibiotic therapy in the hospital on July  1st, for a total of 6 weeks.  Check MRI of the lumbar spine on July 1.  2.Chronic anemia with iron deficiency. Multifactorial anemia, will continue close monitoring of cell count. Continue iron supplements.   3.Protein calorie malnutrition. Continue nutritional supplements. Patient is tolerating po well, continue to be very weak and deconditioned.  4.Polysubstance abuse. No withdrawal symptoms. Continue as needed diazepam.  5.Hypertension. Continue blood pressure control with metoprolol.  6.Pneumonia with left pleural effusion. Chest film with left pleural effusion and compression atelectasis. Patient with no cough or further chest pain, his procalcitonin was low at <0.10. Has been on Zosyn since 08/01/18. Patient oxygenating well. Stable leukocytosis is 13,8. Will discontinue antibiotic therapy for now and continue close monitoring.   7 reactive thrombocytosis follow clinically.  DVT prophylaxis:enoxaparin   Code Status:full Family Communication:no family at the bedside Disposition Plan/ discharge barriers:pending completing antibiotic therapy on July 1st.   Estimated body mass index is 23.68 kg/m as calculated from the following:   Height as of this encounter: 5\' 10"  (1.778 m).   Weight as of this encounter: 74.8 kg.  Subjective:  Sutures from right shoulder out yesterday Objective: Vitals:   08/05/18 2347 08/06/18 0322 08/06/18 0738 08/06/18 1105  BP: (!) 141/74 99/61 115/75 113/63  Pulse: (!) 101 (!) 102 (!) 101 (!) 112  Resp: 16 16 17 15   Temp: 98.2 F (36.8 C) 98.2 F (36.8 C) 97.8 F (36.6 C) 98.6 F (37 C)  TempSrc: Oral Oral Oral Oral  SpO2: 98% 94% 96% 96%  Weight:      Height:        Intake/Output Summary (Last 24 hours) at 08/06/2018 1123 Last data  filed at 08/06/2018 0325 Gross per 24 hour  Intake 240 ml  Output 1325 ml  Net -1085 ml   Filed Weights   06/19/18 1303 06/23/18 1708 06/28/18 1543  Weight: 74.8 kg 74.8 kg  74.8 kg    Examination:  General exam: Appears calm and comfortable  Respiratory system: Clear to auscultation. Respiratory effort normal. Cardiovascular system: S1 & S2 heard, RRR. No JVD, murmurs, rubs, gallops or clicks. No pedal edema. Gastrointestinal system: Abdomen is nondistended, soft and nontender. No organomegaly or masses felt. Normal bowel sounds heard. Central nervous system: Alert and oriented. No focal neurological deficits. Extremities:right shoulder and left knee incision intact Skin: No rashes, lesions or ulcers Psychiatry: Judgement and insight appear normal. Mood & affect appropriate.     Data Reviewed: I have personally reviewed following labs and imaging studies  CBC: Recent Labs  Lab 08/05/18 0728  WBC 10.6*  NEUTROABS 6.4  HGB 7.4*  HCT 25.6*  MCV 83.9  PLT 690*   Basic Metabolic Panel: Recent Labs  Lab 07/31/18 0539 08/03/18 1400 08/05/18 0728  NA 132* 137 137  K 3.8 3.4* 3.9  CL 93* 103 99  CO2 29 23 25   GLUCOSE 96 99 99  BUN 16 13 12   CREATININE 0.68 0.66 0.74  CALCIUM 9.0 7.4* 8.9   GFR: Estimated Creatinine Clearance: 110.3 mL/min (by C-G formula based on SCr of 0.74 mg/dL). Liver Function Tests: No results for input(s): AST, ALT, ALKPHOS, BILITOT, PROT, ALBUMIN in the last 168 hours. No results for input(s): LIPASE, AMYLASE in the last 168 hours. No results for input(s): AMMONIA in the last 168 hours. Coagulation Profile: No results for input(s): INR, PROTIME in the last 168 hours. Cardiac Enzymes: Recent Labs  Lab 07/31/18 1700 07/31/18 2222 08/01/18 1654  TROPONINI <0.03 <0.03 <0.03   BNP (last 3 results) No results for input(s): PROBNP in the last 8760 hours. HbA1C: No results for input(s): HGBA1C in the last 72 hours. CBG: No results for input(s): GLUCAP in the last 168 hours. Lipid Profile: No results for input(s): CHOL, HDL, LDLCALC, TRIG, CHOLHDL, LDLDIRECT in the last 72 hours. Thyroid Function Tests: No  results for input(s): TSH, T4TOTAL, FREET4, T3FREE, THYROIDAB in the last 72 hours. Anemia Panel: No results for input(s): VITAMINB12, FOLATE, FERRITIN, TIBC, IRON, RETICCTPCT in the last 72 hours. Sepsis Labs: Recent Labs  Lab 08/01/18 1654  PROCALCITON <0.10    No results found for this or any previous visit (from the past 240 hour(s)).       Radiology Studies: No results found.      Scheduled Meds: . diclofenac sodium  2 g Topical QID  . docusate sodium  200 mg Oral Daily  . enoxaparin (LOVENOX) injection  40 mg Subcutaneous Daily  . esomeprazole  20 mg Oral QAC breakfast  . feeding supplement (ENSURE ENLIVE)  237 mL Oral TID BM  . ferrous sulfate  325 mg Oral BID WC  . folic acid  1 mg Oral Daily  . methocarbamol  500 mg Oral TID  . metoprolol tartrate  12.5 mg Oral BID  . multivitamin with minerals  1 tablet Oral Daily  . polyethylene glycol  17 g Oral BID  . sodium chloride flush  10-40 mL Intracatheter Q12H  . thiamine  100 mg Oral Daily   Or  . thiamine  100 mg Intravenous Daily  . vitamin B-12  100 mcg Oral Daily   Continuous Infusions: . sodium chloride 10 mL/hr at 07/31/18 1433  .  vancomycin 750 mg (08/06/18 0210)     LOS: 48 days     Georgette Shell, MD [facility]  If 7PM-7AM, please contact night-coverage [email or web address] Password [facility] 08/06/2018, 11:23 AM

## 2018-08-07 ENCOUNTER — Inpatient Hospital Stay: Payer: Self-pay | Admitting: Internal Medicine

## 2018-08-07 MED ORDER — SODIUM CHLORIDE 0.9 % IV SOLN
INTRAVENOUS | Status: DC
  Administered 2018-08-07 – 2018-08-08 (×3): via INTRAVENOUS

## 2018-08-07 NOTE — Progress Notes (Signed)
Patient suffers from diskitis ,septic arthritis  which impairs their ability to perform daily activities like ADLs in the home. A walker will not resolve issue with performing activities of daily living. A wheelchair will allow patient to safely perform daily activities. Patient can safely propel the wheelchair in the home or has a caregiver who can provide assistance. Length of need life long  Accessories: elevating leg rests (ELRs), wheel locks, extensions and anti-tippers.  Patient suffers from chronic pain in his back and shoulder that is caused by septic arthritis and spinal stenosis. A hospital bed will alleviate the pain by allowing his back to be positioned in ways not feasible with a normal bed. Pain frequently requires changes in body position which cannot be achieved with a normal bed.

## 2018-08-07 NOTE — Progress Notes (Signed)
Physical Therapy Treatment Patient Details Name: [name] MRN: [id number] DOB: Dec 01, 1965 Today's Date: 08/07/2018    History of Present Illness Pt. with PMH of hep C, substance abuse; admitted on 06/19/2018, presented with complaint of back pain, was found to have musculoskeletal back pain as well as right upper extremity cellulitis; Patient had a debridement of his shoulder perfromed on 5/20.     PT Comments    Patient increased his gait distance. He fatigued quickly but he was able to ambulate further. He had mild syncope with gait. He required frequent cuing to breathe. His B/P increased with activity. He was strongly encouraged to try to sit up for meals as long as the nurse is aware and present. Acute therapy will continue to progress the patient.   Follow Up Recommendations  SNF     Equipment Recommendations  Other (comment)    Recommendations for Other Services Rehab consult     Precautions / Restrictions Precautions Precautions: Shoulder Type of Shoulder Precautions: per 5/30 Dr dean notes it states AROM WBAT within tolerance. Rn to get clarification for stitches removal as patient has had stitches in R shoulder since 06/28/18. OT asking RN to address this with MD Restrictions RUE Weight Bearing: Weight bearing as tolerated LLE Weight Bearing: Weight bearing as tolerated    Mobility  Bed Mobility Overal bed mobility: Needs Assistance Bed Mobility: Supine to Sit;Sit to Supine     Supine to sit: Min assist Sit to supine: Min assist   General bed mobility comments: min a to sit up in the bed. Min a to get legs back into the bed   Transfers Overall transfer level: Needs assistance Equipment used: Rolling walker (2 wheeled) Transfers: Sit to/from Stand Sit to Stand: Mod assist         General transfer comment: cuing for leg placement. Bed elevated slightly.   Ambulation/Gait Ambulation/Gait assistance: Min assist Gait Distance (Feet): 15 Feet Assistive  device: Rolling walker (2 wheeled) Gait Pattern/deviations: Step-to pattern;Decreased step length - left;Decreased stance time - left Gait velocity: decreased   General Gait Details: slow step to gait pattern. Cuing for the patient to breathe and not to let the walker too faer out in front of him. Patient fatigued quickly but was able to get back to the bed.    Stairs             Wheelchair Mobility    Modified Rankin (Stroke Patients Only)       Balance Overall balance assessment: Needs assistance Sitting-balance support: No upper extremity supported;Feet supported Sitting balance-Leahy Scale: Fair     Standing balance support: Bilateral upper extremity supported;During functional activity Standing balance-Leahy Scale: Poor Standing balance comment: heavily reliant on external support                            Cognition Arousal/Alertness: Awake/alert Behavior During Therapy: WFL for tasks assessed/performed Overall Cognitive Status: Within Functional Limits for tasks assessed                                 General Comments: Self-limiting      Exercises      General Comments General comments (skin integrity, edema, etc.): supine B/P 99/77 seated 105/71 standing 105/77 after gait 110/77       Pertinent Vitals/Pain Pain Assessment: Faces Faces Pain Scale: Hurts a little bit Pain Location: low back  Pain Intervention(s): Monitored during session;Limited activity within patient's tolerance    Home Living                      Prior Function            PT Goals (current goals can now be found in the care plan section) Acute Rehab PT Goals Patient Stated Goal: to walk further  PT Goal Formulation: With patient Time For Goal Achievement: 07/05/18 Potential to Achieve Goals: Good Progress towards PT goals: Progressing toward goals    Frequency    Min 3X/week      PT Plan Current plan remains appropriate     Co-evaluation              AM-PAC PT "6 Clicks" Mobility   Outcome Measure  Help needed turning from your back to your side while in a flat bed without using bedrails?: A Little Help needed moving from lying on your back to sitting on the side of a flat bed without using bedrails?: A Little Help needed moving to and from a bed to a chair (including a wheelchair)?: A Lot Help needed standing up from a chair using your arms (e.g., wheelchair or bedside chair)?: A Lot Help needed to walk in hospital room?: A Lot Help needed climbing 3-5 steps with a railing? : Total 6 Click Score: 13    End of Session Equipment Utilized During Treatment: Gait belt Activity Tolerance: Patient limited by pain Patient left: in bed;with call bell/phone within reach;with bed alarm set Nurse Communication: Mobility status PT Visit Diagnosis: Unsteadiness on feet (R26.81);Muscle weakness (generalized) (M62.81);Pain;Other abnormalities of gait and mobility (R26.89) Pain - Right/Left: Right Pain - part of body: Arm     Time: 0205-0228 PT Time Calculation (min) (ACUTE ONLY): 23 min  Charges:  $Gait Training: 8-22 mins $Therapeutic Activity: 8-22 mins                      [name] PT DPT  08/07/2018, 3:14 PM

## 2018-08-07 NOTE — Progress Notes (Signed)
Pharmacy Antibiotic Note  [name] is a 53 y.o. male admitted on 06/19/2018 with back pain, now with septic arthritis of multiple joints. Pharmacy has been consulted for vancomycin dosing. Patient is s/p I&D of R shoulder and the [facility] joint on 5/20. Zosyn added for possible pneumonia on 6/23, now completed. Renal function stable.  Planning for 6 weeks of ABX per ID. Last day of therapy is 7/1.    Plan: Continue vancomycin 750mg  IV Q12H - stop date 7/1 F/u renal fxn, clinical status, and levels as needed  Height: 5\' 10"  (177.8 cm) Weight: 165 lb (74.8 kg) IBW/kg (Calculated) : 73  Temp (24hrs), Avg:98.5 F (36.9 C), Min:97.9 F (36.6 C), Max:98.9 F (37.2 C)  Recent Labs  Lab 08/03/18 1400 08/05/18 0728  WBC  --  10.6*  CREATININE 0.66 0.74    Estimated Creatinine Clearance: 110.3 mL/min (by C-G formula based on SCr of 0.74 mg/dL).    No Known Allergies   Vanc 5/15 >>(7/1) CTX 5/12 >> 5/16 Zosyn 6/23>>6/26  5/11 covid - negative 5/12 BCx - negative 5/14 synovial fluid L knee - GPC on stain >> negative 5/15 synovial fluid L knee - GPC on stain >> negative 5/20 BCx: neg 5/20 posterior shoulder abcess - neg 5/20 AC fluid joint - neg  Elicia Lamp, PharmD, BCPS Clinical Pharmacist Please check AMION for all [place] contact numbers 08/07/2018 10:14 AM

## 2018-08-07 NOTE — Progress Notes (Signed)
TOC spoke to patients daughter: [name] about pts brother and sister in law coming for education. She states neither of them drive and they both have learning disabilities. TOC will talk with Mr Kuch in the am about them coming to the hospital.  TOC did send orders for DME to [employer] today for charity care.

## 2018-08-07 NOTE — Plan of Care (Signed)
Progressing towards goals

## 2018-08-07 NOTE — Progress Notes (Signed)
OT NOTE  Recommending SNF placement due to fall risk and syncopal event during OT session in static sitting. Pt tearful and expressing his desire to return home. Pt is high fall risk and will need increase caregivers. Pt reports father in his 39s, a brother that lives down the street and sister in law will be able to give PRN (A) upon discharge. Requesting family education prior to discharge to brother or sister in law if they are going to be primary caregivers.   Jeri Modena, OTR/L  Acute Rehabilitation Services Pager: [phone] Office: [phone] .

## 2018-08-07 NOTE — Progress Notes (Signed)
Occupational Therapy Treatment Patient Details Name: Carollee MassedJames Ohanesian MRN: [id number] DOB: 01/19/1966 Today's Date: 08/07/2018    History of present illness Pt. with PMH of hep C, substance abuse; admitted on 06/19/2018, presented with complaint of back pain, was found to have musculoskeletal back pain as well as right upper extremity cellulitis; Patient had a debridement of his shoulder perfromed on 5/20.    OT comments  Pt with syncopal event with LOC during session static sitting in chair. Pt verbalized dizziness and that he felt that he was going to "pass out" prior to event. Pt does complain of L chest discomfort prior to session and his concern for his heart. Pt states the pain in L chest is only when he breaths in deeply otherwise no complaint. Pt tolerated OOB to sink level static grooming task with heavy lean on sink surface. Pt static sitting for hair grooming when syncope began. Pt very anxious in the moment but then very tearful once supine. Pt expressing sorrow for therapist in the moment and pt reassured that he needs to continue with transfers with staff.     Follow Up Recommendations  SNF;Supervision/Assistance - 24 hour    Equipment Recommendations  Wheelchair (measurements OT)    Recommendations for Other Services      Precautions / Restrictions Precautions Precautions: Shoulder Type of Shoulder Precautions: per 5/30 Dr dean notes it states AROM WBAT within tolerance. Rn to get clarification for stitches removal as patient has had stitches in R shoulder since 06/28/18. OT asking RN to address this with MD       Mobility Bed Mobility Overal bed mobility: Needs Assistance Bed Mobility: Supine to Sit;Sit to Supine     Supine to sit: Mod assist Sit to supine: Max assist   General bed mobility comments: pt requires pad used to pivot patient to EOB due seat deflate on the bed surface. pt requires total (A) for bil LE back onto bed surface after event.   Transfers Overall  transfer level: Needs assistance Equipment used: Rolling walker (2 wheeled) Transfers: Sit to/from Stand Sit to Stand: Max assist         General transfer comment: pt min (A) initially with start of sessino but requires max (A) to returnt o bed from chair after syncope    Balance                                           ADL either performed or assessed with clinical judgement   ADL Overall ADL's : Needs assistance/impaired     Grooming: Moderate assistance;Standing Grooming Details (indicate cue type and reason): static standing to wash hands this session. pt sitting down in chair for grooming task. pt becomes dizzy sitting and states "i am going to pass out" pt LOC in chair with therapist TOTal (A) to remain in chair. Pt vomitting during event. RN present for entire event. BP obtained and charted by RN Britt BoozerJenn.                  Toilet Transfer: Minimal assistance;RW           Functional mobility during ADLs: Minimal assistance;Rolling walker General ADL Comments: pt positioned in chair with pillow in bottom and behind the patients back. pt max (A) from chair back to bed due to syncope     Vision       Perception  Praxis      Cognition Arousal/Alertness: Awake/alert Behavior During Therapy: WFL for tasks assessed/performed Overall Cognitive Status: Within Functional Limits for tasks assessed                                          Exercises     Shoulder Instructions       General Comments      Pertinent Vitals/ Pain       Pain Assessment: No/denies pain Pain Intervention(s): Premedicated before session;Repositioned;Monitored during session  Home Living                                          Prior Functioning/Environment              Frequency  Min 3X/week        Progress Toward Goals  OT Goals(current goals can now be found in the care plan section)  Progress towards OT goals:  Progressing toward goals(limited this session by syncope)  Acute Rehab OT Goals Patient Stated Goal: "im sorry youngin. you were doing so good with me" OT Goal Formulation: With patient Time For Goal Achievement: 08/10/18 Potential to Achieve Goals: Good ADL Goals Pt Will Perform Grooming: with modified independence;sitting Pt Will Perform Upper Body Dressing: with modified independence;sitting Pt Will Perform Lower Body Dressing: with modified independence;sitting/lateral leans;sit to/from stand Pt Will Transfer to Toilet: with min guard assist;ambulating;bedside commode Pt/caregiver will Perform Home Exercise Program: Both right and left upper extremity;With Supervision;With written HEP provided Additional ADL Goal #1: Pt will sit EOB for ADL tasks with set-upA and fair sitting balance  Plan Discharge plan remains appropriate    Co-evaluation                 AM-PAC OT "6 Clicks" Daily Activity     Outcome Measure   Help from another person eating meals?: None Help from another person taking care of personal grooming?: None Help from another person toileting, which includes using toliet, bedpan, or urinal?: A Lot Help from another person bathing (including washing, rinsing, drying)?: A Lot Help from another person to put on and taking off regular upper body clothing?: A Lot Help from another person to put on and taking off regular lower body clothing?: Total 6 Click Score: 15    End of Session Equipment Utilized During Treatment: Rolling walker;Gait belt  OT Visit Diagnosis: Muscle weakness (generalized) (M62.81);Pain Pain - Right/Left: Right Pain - part of body: Hip;Leg;Shoulder   Activity Tolerance Patient tolerated treatment well   Patient Left in bed;with call bell/phone within reach;with bed alarm set   Nurse Communication Mobility status;Precautions        Time: 1112-1200 OT Time Calculation (min): 48 min  Charges: OT General Charges $OT Visit: 1  Visit OT Treatments $Self Care/Home Management : 38-52 mins   Jeri Modena, OTR/L  Acute Rehabilitation Services Pager: [phone] Office: [phone] .    Jeri Modena 08/07/2018, 1:31 PM

## 2018-08-07 NOTE — Progress Notes (Signed)
PROGRESS NOTE    Carollee MassedJames Stegmaier  ZOX:[id number] DOB: 01/18/1966 DOA: 06/19/2018 PCP: Patient, No Pcp Per  Brief Narrative: 53 year old male who presented with back pain. He does have significant past medical history for hepatitis C. He reported severe back pain, radiating down to his left leg,and associated with difficulty standing and walking. He was found to have a right upper extremity cellulitis and was started on IV antibiotics. Further work-up with CT scan revealed a intra-facial abscess of the right shoulder along [facility].His hospitalization was complicated by lumbar spine discitis,paraspinal abscess, andleftknee infected arthritis.  He has been treated with IV antibiotics, vancomycin since Jun 23, 2018.  Infectious disease has recommended 6 weeks of IV antibiotic therapy.  June 22nd he was diagnosed with pneumonia,and started with Zosyn.    Assessment & Plan:   Principal Problem:   Septic arthritis of multiple joints (HCC) Active Problems:   Hepatitis C antibody test positive   Cigarette smoker   Intractable back pain   AKI (acute kidney injury) (HCC)   Spinal stenosis of lumbosacral region   GERD (gastroesophageal reflux disease)   Mild protein-calorie malnutrition (HCC)   Septic arthritis of knee, left (HCC)   Acute medial meniscal tear, left, initial encounter   Polysubstance abuse (HCC)   Normocytic anemia   Effusion of left elbow   Recurrent boils   Discitis of lumbosacral region   Psoas abscess, right (HCC)   Multiple abscesses of right shoulder   Chronic infection of left knee (HCC)   1.Multiple jointsseptic arthritis, left elbow, right shoulder, discitis, lumbar spine abscess, leftknee. Patient has been tolerating well antibiotic therapy. Will need follow up MRI of his lumbar spine after completing antibiotic therapy. Last positive blood culture 06/28/18, plan to complete antibiotic therapy in the hospital on July  1st, for a total of 6 weeks.Check MRI of the lumbar spine on July 1. Will order hospital bed and wheelchair in preparation for discharge this week.  2.Chronic anemia with iron deficiency. Multifactorial anemia, will continue close monitoring of cell count. Continue iron supplements.   3.Protein calorie malnutrition. Continue nutritional supplements. Patient is tolerating po well, continue to be very weak and deconditioned.  4.Polysubstance abuse. No withdrawal symptoms. Continue as needed diazepam.  5.Hypertension. Continue blood pressure control with metoprolol.  6.Pneumonia with left pleural effusion. Chest film with left pleural effusion and compression atelectasis. Patient with no cough or further chest pain, his procalcitonin was low at <0.10. Has been on Zosyn since 08/01/18. Patient oxygenating well. Stable leukocytosis is 13,8. Will discontinue antibiotic therapy for now and continue close monitoring.   7reactive thrombocytosis follow clinically.  DVT prophylaxis:enoxaparin   Code Status:full  Family Communication:no family at the bedside Disposition Plan/ discharge barriers:pending completing antibiotic therapy on July 1st.  Estimated body mass index is 23.68 kg/m as calculated from the following:   Height as of this encounter: 5\' 10"  (1.778 m).   Weight as of this encounter: 74.8 kg.   Subjective: Patient concerned about it left over suture on his right shoulder the sutures were removed 2 days ago but nurses were unable to remove one suture on his right shoulder Objective: Vitals:   08/06/18 1959 08/06/18 2334 08/07/18 0310 08/07/18 0751  BP: 122/68 125/66 (!) 106/59 120/67  Pulse: (!) 105 (!) 115 (!) 102 (!) 102  Resp: 15 15 15 18   Temp: 98.7 F (37.1 C) 98.9 F (37.2 C) 98.6 F (37 C)   TempSrc: Oral Oral Oral  SpO2: 94% 98% 95%   Weight:      Height:        Intake/Output Summary (Last 24 hours) at 08/07/2018 1143 Last  data filed at 08/07/2018 1131 Gross per 24 hour  Intake 2151.08 ml  Output 2425 ml  Net -273.92 ml   Filed Weights   06/19/18 1303 06/23/18 1708 06/28/18 1543  Weight: 74.8 kg 74.8 kg 74.8 kg    Examination:  General exam: Appears calm and comfortable  Respiratory system: Clear to auscultation. Respiratory effort normal. Cardiovascular system: S1 & S2 heard, RRR. No JVD, murmurs, rubs, gallops or clicks. No pedal edema. Gastrointestinal system: Abdomen is nondistended, soft and nontender. No organomegaly or masses felt. Normal bowel sounds heard. Central nervous system: Alert and oriented. No focal neurological deficits. Extremities: Symmetric 5 x 5 power.  One suture left over on the right shoulder noted Skin: No rashes, lesions or ulcers Psychiatry: Judgement and insight appear normal. Mood & affect appropriate.     Data Reviewed: I have personally reviewed following labs and imaging studies  CBC: Recent Labs  Lab 08/05/18 0728  WBC 10.6*  NEUTROABS 6.4  HGB 7.4*  HCT 25.6*  MCV 83.9  PLT 690*   Basic Metabolic Panel: Recent Labs  Lab 08/03/18 1400 08/05/18 0728  NA 137 137  K 3.4* 3.9  CL 103 99  CO2 23 25  GLUCOSE 99 99  BUN 13 12  CREATININE 0.66 0.74  CALCIUM 7.4* 8.9   GFR: Estimated Creatinine Clearance: 110.3 mL/min (by C-G formula based on SCr of 0.74 mg/dL). Liver Function Tests: No results for input(s): AST, ALT, ALKPHOS, BILITOT, PROT, ALBUMIN in the last 168 hours. No results for input(s): LIPASE, AMYLASE in the last 168 hours. No results for input(s): AMMONIA in the last 168 hours. Coagulation Profile: No results for input(s): INR, PROTIME in the last 168 hours. Cardiac Enzymes: Recent Labs  Lab 07/31/18 1700 07/31/18 2222 08/01/18 1654  TROPONINI <0.03 <0.03 <0.03   BNP (last 3 results) No results for input(s): PROBNP in the last 8760 hours. HbA1C: No results for input(s): HGBA1C in the last 72 hours. CBG: No results for input(s):  GLUCAP in the last 168 hours. Lipid Profile: No results for input(s): CHOL, HDL, LDLCALC, TRIG, CHOLHDL, LDLDIRECT in the last 72 hours. Thyroid Function Tests: No results for input(s): TSH, T4TOTAL, FREET4, T3FREE, THYROIDAB in the last 72 hours. Anemia Panel: No results for input(s): VITAMINB12, FOLATE, FERRITIN, TIBC, IRON, RETICCTPCT in the last 72 hours. Sepsis Labs: Recent Labs  Lab 08/01/18 1654  PROCALCITON <0.10    No results found for this or any previous visit (from the past 240 hour(s)).       Radiology Studies: No results found.      Scheduled Meds: . diclofenac sodium  2 g Topical QID  . docusate sodium  200 mg Oral Daily  . enoxaparin (LOVENOX) injection  40 mg Subcutaneous Daily  . esomeprazole  20 mg Oral QAC breakfast  . feeding supplement (ENSURE ENLIVE)  237 mL Oral TID BM  . ferrous sulfate  325 mg Oral BID WC  . folic acid  1 mg Oral Daily  . methocarbamol  500 mg Oral TID  . metoprolol tartrate  12.5 mg Oral BID  . multivitamin with minerals  1 tablet Oral Daily  . polyethylene glycol  17 g Oral BID  . sodium chloride flush  10-40 mL Intracatheter Q12H  . thiamine  100 mg Oral Daily   Or  .  thiamine  100 mg Intravenous Daily  . vitamin B-12  100 mcg Oral Daily   Continuous Infusions: . sodium chloride 10 mL/hr at 07/31/18 1433  . vancomycin 750 mg (08/07/18 0133)     LOS: 26 days      Georgette Shell, MD [facility]  If 7PM-7AM, please contact night-coverage [email or web address] Password [facility] 08/07/2018, 11:43 AM

## 2018-08-08 LAB — CBC WITH DIFFERENTIAL/PLATELET
Abs Immature Granulocytes: 0.21 10*3/uL — ABNORMAL HIGH (ref 0.00–0.07)
Basophils Absolute: 0.1 10*3/uL (ref 0.0–0.1)
Basophils Relative: 1 %
Eosinophils Absolute: 0.3 10*3/uL (ref 0.0–0.5)
Eosinophils Relative: 2 %
HCT: 21.7 % — ABNORMAL LOW (ref 39.0–52.0)
Hemoglobin: 6.5 g/dL — CL (ref 13.0–17.0)
Immature Granulocytes: 2 %
Lymphocytes Relative: 24 %
Lymphs Abs: 2.5 10*3/uL (ref 0.7–4.0)
MCH: 24.4 pg — ABNORMAL LOW (ref 26.0–34.0)
MCHC: 30 g/dL (ref 30.0–36.0)
MCV: 81.6 fL (ref 80.0–100.0)
Monocytes Absolute: 0.7 10*3/uL (ref 0.1–1.0)
Monocytes Relative: 7 %
Neutro Abs: 6.6 10*3/uL (ref 1.7–7.7)
Neutrophils Relative %: 64 %
Platelets: 679 10*3/uL — ABNORMAL HIGH (ref 150–400)
RBC: 2.66 MIL/uL — ABNORMAL LOW (ref 4.22–5.81)
RDW: 18.2 % — ABNORMAL HIGH (ref 11.5–15.5)
WBC: 10.4 10*3/uL (ref 4.0–10.5)
nRBC: 0 % (ref 0.0–0.2)

## 2018-08-08 LAB — COMPREHENSIVE METABOLIC PANEL
ALT: 40 U/L (ref 0–44)
AST: 18 U/L (ref 15–41)
Albumin: 1.6 g/dL — ABNORMAL LOW (ref 3.5–5.0)
Alkaline Phosphatase: 122 U/L (ref 38–126)
Anion gap: 8 (ref 5–15)
BUN: 13 mg/dL (ref 6–20)
CO2: 27 mmol/L (ref 22–32)
Calcium: 8.7 mg/dL — ABNORMAL LOW (ref 8.9–10.3)
Chloride: 102 mmol/L (ref 98–111)
Creatinine, Ser: 0.71 mg/dL (ref 0.61–1.24)
GFR calc Af Amer: >60 mL/min (ref >60)
GFR calc non Af Amer: >60 mL/min (ref >60)
Glucose, Bld: 106 mg/dL — ABNORMAL HIGH (ref 70–99)
Potassium: 4 mmol/L (ref 3.5–5.1)
Sodium: 137 mmol/L (ref 135–145)
Total Bilirubin: 0.4 mg/dL (ref 0.3–1.2)
Total Protein: 6.2 g/dL — ABNORMAL LOW (ref 6.5–8.1)

## 2018-08-08 LAB — PREPARE RBC (CROSSMATCH)

## 2018-08-08 LAB — ABO/RH: ABO/RH(D): O NEG

## 2018-08-08 MED ORDER — SODIUM CHLORIDE 0.9% IV SOLUTION: Freq: Once | INTRAVENOUS | Status: DC

## 2018-08-08 NOTE — Progress Notes (Signed)
Physical Therapy Treatment Patient Details Name: Clifford MassedJames Chan MRN: [id number] DOB: 12/08/1965 Today's Date: 08/08/2018    History of Present Illness Pt. with PMH of hep C, substance abuse; admitted on 06/19/2018, presented with complaint of back pain, was found to have musculoskeletal back pain as well as right upper extremity cellulitis; Patient had a debridement of his shoulder perfromed on 5/20.     PT Comments    Patient made great progress today. He got blood in the morning. He reported no syncope with gait. His gait pattern was very slow but steady. He reported no pain in U or LE. He had improved stability with transfers and did not require assist with bed mobility. Therapy will continue to progress.   Follow Up Recommendations  SNF     Equipment Recommendations  Other (comment)    Recommendations for Other Services Rehab consult     Precautions / Restrictions Precautions Precautions: Shoulder Type of Shoulder Precautions: per 5/30 Dr dean notes it states AROM WBAT within tolerance.Restrictions RUE Weight Bearing: Weight bearing as tolerated LLE Weight Bearing: Weight bearing as tolerated    Mobility  Bed Mobility Overal bed mobility: Needs Assistance Bed Mobility: Supine to Sit;Sit to Supine Rolling: Min guard   Supine to sit: Min guard Sit to supine: Min guard   General bed mobility comments: gaurding for balance today but able to get to the edge of the day   Transfers Overall transfer level: Needs assistance Equipment used: Rolling walker (2 wheeled) Transfers: Sit to/from Stand Sit to Stand: Min assist            Ambulation/Gait Ambulation/Gait assistance: Min Chemical engineerassist Gait Distance (Feet): 120 Feet Assistive device: Rolling walker (2 wheeled) Gait Pattern/deviations: Step-to pattern Gait velocity: decreased   General Gait Details: slow step to gait pattern with frequent short rest breaks but aable to ambualte 120'. No syncope or shoulder pain.  Ambaulted with a chair follow.    Stairs             Wheelchair Mobility    Modified Rankin (Stroke Patients Only)       Balance Overall balance assessment: Needs assistance Sitting-balance support: No upper extremity supported;Feet supported Sitting balance-Leahy Scale: Good     Standing balance support: Bilateral upper extremity supported;During functional activity Standing balance-Leahy Scale: Poor Standing balance comment: moderate reliance on UE                             Cognition Arousal/Alertness: Awake/alert Behavior During Therapy: WFL for tasks assessed/performed Overall Cognitive Status: Within Functional Limits for tasks assessed                                 General Comments: Self-limiting      Exercises      General Comments        Pertinent Vitals/Pain Pain Assessment: Faces Faces Pain Scale: No hurt    Home Living                      Prior Function            PT Goals (current goals can now be found in the care plan section) Acute Rehab PT Goals Patient Stated Goal: to walk further  PT Goal Formulation: With patient Time For Goal Achievement: 07/05/18 Potential to Achieve Goals: Good Progress towards PT goals: Progressing toward goals  Frequency    Min 3X/week      PT Plan Current plan remains appropriate    Co-evaluation              AM-PAC PT "6 Clicks" Mobility   Outcome Measure  Help needed turning from your back to your side while in a flat bed without using bedrails?: A Little Help needed moving from lying on your back to sitting on the side of a flat bed without using bedrails?: A Little Help needed moving to and from a bed to a chair (including a wheelchair)?: A Lot Help needed standing up from a chair using your arms (e.g., wheelchair or bedside chair)?: A Lot Help needed to walk in hospital room?: A Lot Help needed climbing 3-5 steps with a railing? : Total 6  Click Score: 13    End of Session Equipment Utilized During Treatment: Gait belt Activity Tolerance: Patient limited by pain Patient left: in bed;with call bell/phone within reach;with bed alarm set Nurse Communication: Mobility status PT Visit Diagnosis: Unsteadiness on feet (R26.81);Muscle weakness (generalized) (M62.81);Pain;Other abnormalities of gait and mobility (R26.89) Pain - Right/Left: Right Pain - part of body: Arm     Time: 8676-7209 PT Time Calculation (min) (ACUTE ONLY): 38 min  Charges:  $Gait Training: 23-37 mins $Therapeutic Activity: 8-22 mins                        Carney Living PT DPT  08/08/2018, 3:39 PM

## 2018-08-08 NOTE — Plan of Care (Signed)
Progressing towards goals

## 2018-08-08 NOTE — Progress Notes (Signed)
PROGRESS NOTE    Carollee MassedJames Veldman  ZOX:[id number] DOB: 06/29/1965 DOA: 06/19/2018 PCP: Patient, No Pcp Per   Brief Narrative:  53 year old male who presented with back pain. He does have significant past medical history for hepatitis C. He reported severe back pain, radiating down to his left leg,and associated with difficulty standing and walking. He was found to have a right upper extremity cellulitis and was started on IV antibiotics. Further work-up with CT scan revealed a intra-facial abscess of the right shoulder along [facility].His hospitalization was complicated by lumbar spine discitis,paraspinal abscess, andleftknee infected arthritis.  He has been treated with IV antibiotics, vancomycin since Jun 23, 2018.  Infectious disease has recommended 6 weeks of IV antibiotic therapy.  June 22nd he was diagnosed with pneumonia,and started with Zosyn.   Assessment & Plan:   Principal Problem:   Septic arthritis of multiple joints (HCC) Active Problems:   Hepatitis C antibody test positive   Cigarette smoker   Intractable back pain   AKI (acute kidney injury) (HCC)   Spinal stenosis of lumbosacral region   GERD (gastroesophageal reflux disease)   Mild protein-calorie malnutrition (HCC)   Septic arthritis of knee, left (HCC)   Acute medial meniscal tear, left, initial encounter   Polysubstance abuse (HCC)   Normocytic anemia   Effusion of left elbow   Recurrent boils   Discitis of lumbosacral region   Psoas abscess, right (HCC)   Multiple abscesses of right shoulder   Chronic infection of left knee (HCC)   1.Multiple jointsseptic arthritis, left elbow, right shoulder, discitis, lumbar spine abscess, leftknee. Patient has been tolerating well antibiotic therapy. Will need follow up MRI of his lumbar spine after completing antibiotic therapy. Last positive blood culture 06/28/18, plan to complete antibiotic therapy in the hospital on July  1st, for a total of 6 weeks.Check MRI of the lumbar spine on July 1. Will order hospital bed and wheelchair in preparation for discharge this week.  2.Chronic anemia with iron deficiency.hb 6.4 transfuse one unit. 3.Protein calorie malnutrition. Continue nutritional supplements. Patient is tolerating po well, continue to be very weak and deconditioned.  4.Polysubstance abuse. No withdrawal symptoms. Continue as needed diazepam.  5.Hypertension. Continue blood pressure control with metoprolol.  6.Pneumonia with left pleural effusion. Chest film with left pleural effusion and compression atelectasis. Patient with no cough or further chest pain, his procalcitonin was low at <0.10. Has been on Zosyn since 08/01/18. Patient oxygenating well. Stable leukocytosis is 13,8. Will discontinue antibiotic therapy for now and continue close monitoring.   7reactive thrombocytosis follow clinically.  DVT prophylaxis:enoxaparin    Code Status:full  Family Communication:no family at the bedside Disposition Plan/ discharge barriers:pending completing antibiotic therapy on July 1st.   Estimated body mass index is 23.68 kg/m as calculated from the following:   Height as of this encounter: 5\' 10"  (1.778 m).   Weight as of this encounter: 74.8 kg.   Subjective: Passed out when walking yesterday..gave ivf and blood transfusion today wants to go home dont want to go to snf  Objective: Vitals:   08/08/18 0915 08/08/18 0930 08/08/18 1125 08/08/18 1208  BP: (!) 108/58 118/68 116/68 121/69  Pulse: (!) 102 (!) 105 (!) 109 (!) 102  Resp: 18 18 18 18   Temp: 98.4 F (36.9 C) 98.4 F (36.9 C) 99.3 F (37.4 C) 98.7 F (37.1 C)  TempSrc: Oral Oral Oral Oral  SpO2: 99% 98% 94% 99%  Weight:  Height:        Intake/Output Summary (Last 24 hours) at 08/08/2018 1216 Last data filed at 08/08/2018 1208 Gross per 24 hour  Intake 2886.12 ml  Output 1610 ml  Net 1276.12  ml   Filed Weights   06/19/18 1303 06/23/18 1708 06/28/18 1543  Weight: 74.8 kg 74.8 kg 74.8 kg    Examination:  General exam: Appears calm and comfortable  Respiratory system: Clear to auscultation. Respiratory effort normal. Cardiovascular system: S1 & S2 heard, RRR. No JVD, murmurs, rubs, gallops or clicks. No pedal edema. Gastrointestinal system: Abdomen is nondistended, soft and nontender. No organomegaly or masses felt. Normal bowel sounds heard. Central nervous system: Alert and oriented. No focal neurological deficits. Extremities: Symmetric 5 x 5 power. Skin: No rashes, lesions or ulcers Psychiatry: Judgement and insight appear normal. Mood & affect appropriate.     Data Reviewed: I have personally reviewed following labs and imaging studies  CBC: Recent Labs  Lab 08/05/18 0728 08/08/18 0446  WBC 10.6* 10.4  NEUTROABS 6.4 6.6  HGB 7.4* 6.5*  HCT 25.6* 21.7*  MCV 83.9 81.6  PLT 690* 679*   Basic Metabolic Panel: Recent Labs  Lab 08/03/18 1400 08/05/18 0728 08/08/18 0446  NA 137 137 137  K 3.4* 3.9 4.0  CL 103 99 102  CO2 23 25 27   GLUCOSE 99 99 106*  BUN 13 12 13   CREATININE 0.66 0.74 0.71  CALCIUM 7.4* 8.9 8.7*   GFR: Estimated Creatinine Clearance: 110.3 mL/min (by C-G formula based on SCr of 0.71 mg/dL). Liver Function Tests: Recent Labs  Lab 08/08/18 0446  AST 18  ALT 40  ALKPHOS 122  BILITOT 0.4  PROT 6.2*  ALBUMIN 1.6*   No results for input(s): LIPASE, AMYLASE in the last 168 hours. No results for input(s): AMMONIA in the last 168 hours. Coagulation Profile: No results for input(s): INR, PROTIME in the last 168 hours. Cardiac Enzymes: Recent Labs  Lab 08/01/18 1654  TROPONINI <0.03   BNP (last 3 results) No results for input(s): PROBNP in the last 8760 hours. HbA1C: No results for input(s): HGBA1C in the last 72 hours. CBG: No results for input(s): GLUCAP in the last 168 hours. Lipid Profile: No results for input(s): CHOL,  HDL, LDLCALC, TRIG, CHOLHDL, LDLDIRECT in the last 72 hours. Thyroid Function Tests: No results for input(s): TSH, T4TOTAL, FREET4, T3FREE, THYROIDAB in the last 72 hours. Anemia Panel: No results for input(s): VITAMINB12, FOLATE, FERRITIN, TIBC, IRON, RETICCTPCT in the last 72 hours. Sepsis Labs: Recent Labs  Lab 08/01/18 1654  PROCALCITON <0.10    No results found for this or any previous visit (from the past 240 hour(s)).       Radiology Studies: No results found.      Scheduled Meds: . sodium chloride   Intravenous Once  . diclofenac sodium  2 g Topical QID  . docusate sodium  200 mg Oral Daily  . enoxaparin (LOVENOX) injection  40 mg Subcutaneous Daily  . esomeprazole  20 mg Oral QAC breakfast  . feeding supplement (ENSURE ENLIVE)  237 mL Oral TID BM  . ferrous sulfate  325 mg Oral BID WC  . folic acid  1 mg Oral Daily  . methocarbamol  500 mg Oral TID  . metoprolol tartrate  12.5 mg Oral BID  . multivitamin with minerals  1 tablet Oral Daily  . polyethylene glycol  17 g Oral BID  . sodium chloride flush  10-40 mL Intracatheter Q12H  . thiamine  100 mg Oral Daily   Or  . thiamine  100 mg Intravenous Daily  . vitamin B-12  100 mcg Oral Daily   Continuous Infusions: . sodium chloride 10 mL/hr at 07/31/18 1433  . sodium chloride 100 mL/hr at 08/08/18 0758  . vancomycin 750 mg (08/08/18 0436)     LOS: 73 days     Georgette Shell, MD [facility]  If 7PM-7AM, please contact night-coverage [email or web address] Password [email or web address] 08/08/2018, 12:16 PM

## 2018-08-09 ENCOUNTER — Inpatient Hospital Stay (HOSPITAL_COMMUNITY): Payer: Medicaid Other

## 2018-08-09 LAB — CBC WITH DIFFERENTIAL/PLATELET
Abs Immature Granulocytes: 0.19 10*3/uL — ABNORMAL HIGH (ref 0.00–0.07)
Basophils Absolute: 0.1 10*3/uL (ref 0.0–0.1)
Basophils Relative: 1 %
Eosinophils Absolute: 0.3 10*3/uL (ref 0.0–0.5)
Eosinophils Relative: 2 %
HCT: 26.8 % — ABNORMAL LOW (ref 39.0–52.0)
Hemoglobin: 8.1 g/dL — ABNORMAL LOW (ref 13.0–17.0)
Immature Granulocytes: 2 %
Lymphocytes Relative: 29 %
Lymphs Abs: 3.4 10*3/uL (ref 0.7–4.0)
MCH: 25.2 pg — ABNORMAL LOW (ref 26.0–34.0)
MCHC: 30.2 g/dL (ref 30.0–36.0)
MCV: 83.5 fL (ref 80.0–100.0)
Monocytes Absolute: 0.8 10*3/uL (ref 0.1–1.0)
Monocytes Relative: 7 %
Neutro Abs: 6.7 10*3/uL (ref 1.7–7.7)
Neutrophils Relative %: 59 %
Platelets: 738 10*3/uL — ABNORMAL HIGH (ref 150–400)
RBC: 3.21 MIL/uL — ABNORMAL LOW (ref 4.22–5.81)
RDW: 18.6 % — ABNORMAL HIGH (ref 11.5–15.5)
WBC: 11.5 10*3/uL — ABNORMAL HIGH (ref 4.0–10.5)
nRBC: 0 % (ref 0.0–0.2)

## 2018-08-09 LAB — TYPE AND SCREEN
ABO/RH(D): O NEG
Antibody Screen: NEGATIVE
Unit division: 0

## 2018-08-09 LAB — BPAM RBC
Blood Product Expiration Date: 202007292359
ISSUE DATE / TIME: 202006300852
Unit Type and Rh: 5100

## 2018-08-09 MED ORDER — GADOBUTROL 1 MMOL/ML IV SOLN
7.5000 mL | Freq: Once | INTRAVENOUS | Status: AC | PRN
  Administered 2018-08-09: 11:00:00 7.5 mL via INTRAVENOUS

## 2018-08-09 NOTE — Progress Notes (Addendum)
PROGRESS NOTE    Clifford MassedJames Chan  ZOX:[id number] DOB: 01/31/1966 DOA: 06/19/2018 PCP: Patient, No Pcp Per    Brief Narrative: 53 year old male who presented with back pain. He does have significant past medical history for hepatitis C. He reported severe back pain, radiating down to his left leg,and associated with difficulty standing and walking. He was found to have a right upper extremity cellulitis and was started on IV antibiotics. Further work-up with CT scan revealed a intra-facial abscess of the right shoulder along with [facility].His hospitalization was complicated by lumbar spine discitis,paraspinal abscess, andleftknee infected arthritis.  He has been treated with IV antibiotics, vancomycin since Jun 23, 2018.  Infectious disease has recommended 6 weeks of IV antibiotic therapy.  June 22nd he was diagnosed with pneumonia,and started with Zosyn.   Assessment & Plan:   Principal Problem:   Septic arthritis of multiple joints (HCC) Active Problems:   Hepatitis C antibody test positive   Cigarette smoker   Intractable back pain   AKI (acute kidney injury) (HCC)   Spinal stenosis of lumbosacral region   GERD (gastroesophageal reflux disease)   Mild protein-calorie malnutrition (HCC)   Septic arthritis of knee, left (HCC)   Acute medial meniscal tear, left, initial encounter   Polysubstance abuse (HCC)   Normocytic anemia   Effusion of left elbow   Recurrent boils   Discitis of lumbosacral region   Psoas abscess, right (HCC)   Multiple abscesses of right shoulder   Chronic infection of left knee (HCC)  1.Multiple jointsseptic arthritis, left elbow, right shoulder, discitis, lumbar spine abscess, leftknee. Patient has been tolerating well antibiotic therapy. Will need follow up MRI of his lumbar spine after completing antibiotic therapy. Last positive blood culture 06/28/18, plan to complete antibiotic therapy in the hospital on July  1st, for a total of 6 weeks. Today's the last day of his IV antibiotics.Check MRI of the lumbar spine on July 1. hospital bed and wheelchair ordered in preparation for discharge this week.  2.Chronic anemia with iron deficiency. Recheck CBC today patient received 1 unit of blood transfusion yesterday. 3.Protein calorie malnutrition. Continue nutritional supplements. Patient is tolerating po well, continue to be very weak and deconditioned.  4.Polysubstance abuse. No withdrawal symptoms. Continue as needed diazepam.  5.Hypertension. Continue blood pressure control with metoprolol.  6.Pneumonia with left pleural effusion. Chest film with left pleural effusion and compression atelectasis. Patient with no cough or further chest pain, his procalcitonin was low at <0.10. Has been on Zosyn since 08/01/18. Patient oxygenating well. Stable leukocytosis is 13,8. Will discontinue antibiotic therapy for now and continue close monitoring.   7reactive thrombocytosis follow clinically.  DVT prophylaxis:enoxaparin    Code Status:full  Family Communication:no family at the bedside Disposition Plan/ discharge barriers:pending completing antibiotic therapy on July 1st.     Estimated body mass index is 23.68 kg/m as calculated from the following:   Height as of this encounter: 5\' 10"  (1.778 m).   Weight as of this encounter: 74.8 kg.    Subjective: Feels well.. Anxious to get the MRI and go home  Objective: Vitals:   08/08/18 1945 08/09/18 0000 08/09/18 0321 08/09/18 0731  BP: 110/76 108/72 116/80 127/71  Pulse: 97 95 100 96  Resp: 15 16 16 15   Temp: 97.8 F (36.6 C) 98.7 F (37.1 C) 98.7 F (37.1 C) 97.7 F (36.5 C)  TempSrc: Oral Oral Oral Oral  SpO2: 99% 94% 94% 95%  Weight:  Height:        Intake/Output Summary (Last 24 hours) at 08/09/2018 0851 Last data filed at 08/09/2018 0732 Gross per 24 hour  Intake 2242 ml  Output 2675 ml  Net -433  ml   Filed Weights   06/19/18 1303 06/23/18 1708 06/28/18 1543  Weight: 74.8 kg 74.8 kg 74.8 kg    Examination:  General exam: Appears calm and comfortable  Respiratory system: Clear to auscultation. Respiratory effort normal. Cardiovascular system: S1 & S2 heard, RRR. No JVD, murmurs, rubs, gallops or clicks. No pedal edema. Gastrointestinal system: Abdomen is nondistended, soft and nontender. No organomegaly or masses felt. Normal bowel sounds heard. Central nervous system: Alert and oriented. No focal neurological deficits. Extremities: Symmetric 5 x 5 power.  Left knee and right shoulder incisions clean dry intact left over suture on the right shoulder Skin: No rashes, lesions or ulcers Psychiatry: Judgement and insight appear normal. Mood & affect appropriate.     Data Reviewed: I have personally reviewed following labs and imaging studies  CBC: Recent Labs  Lab 08/05/18 0728 08/08/18 0446  WBC 10.6* 10.4  NEUTROABS 6.4 6.6  HGB 7.4* 6.5*  HCT 25.6* 21.7*  MCV 83.9 81.6  PLT 690* 093*   Basic Metabolic Panel: Recent Labs  Lab 08/03/18 1400 08/05/18 0728 08/08/18 0446  NA 137 137 137  K 3.4* 3.9 4.0  CL 103 99 102  CO2 23 25 27   GLUCOSE 99 99 106*  BUN 13 12 13   CREATININE 0.66 0.74 0.71  CALCIUM 7.4* 8.9 8.7*   GFR: Estimated Creatinine Clearance: 110.3 mL/min (by C-G formula based on SCr of 0.71 mg/dL). Liver Function Tests: Recent Labs  Lab 08/08/18 0446  AST 18  ALT 40  ALKPHOS 122  BILITOT 0.4  PROT 6.2*  ALBUMIN 1.6*   No results for input(s): LIPASE, AMYLASE in the last 168 hours. No results for input(s): AMMONIA in the last 168 hours. Coagulation Profile: No results for input(s): INR, PROTIME in the last 168 hours. Cardiac Enzymes: No results for input(s): CKTOTAL, CKMB, CKMBINDEX, TROPONINI in the last 168 hours. BNP (last 3 results) No results for input(s): PROBNP in the last 8760 hours. HbA1C: No results for input(s): HGBA1C in the  last 72 hours. CBG: No results for input(s): GLUCAP in the last 168 hours. Lipid Profile: No results for input(s): CHOL, HDL, LDLCALC, TRIG, CHOLHDL, LDLDIRECT in the last 72 hours. Thyroid Function Tests: No results for input(s): TSH, T4TOTAL, FREET4, T3FREE, THYROIDAB in the last 72 hours. Anemia Panel: No results for input(s): VITAMINB12, FOLATE, FERRITIN, TIBC, IRON, RETICCTPCT in the last 72 hours. Sepsis Labs: No results for input(s): PROCALCITON, LATICACIDVEN in the last 168 hours.  No results found for this or any previous visit (from the past 240 hour(s)).       Radiology Studies: No results found.      Scheduled Meds:  sodium chloride   Intravenous Once   diclofenac sodium  2 g Topical QID   docusate sodium  200 mg Oral Daily   enoxaparin (LOVENOX) injection  40 mg Subcutaneous Daily   esomeprazole  20 mg Oral QAC breakfast   feeding supplement (ENSURE ENLIVE)  237 mL Oral TID BM   ferrous sulfate  325 mg Oral BID WC   folic acid  1 mg Oral Daily   methocarbamol  500 mg Oral TID   metoprolol tartrate  12.5 mg Oral BID   multivitamin with minerals  1 tablet Oral Daily   polyethylene  glycol  17 g Oral BID   sodium chloride flush  10-40 mL Intracatheter Q12H   thiamine  100 mg Oral Daily   Or   thiamine  100 mg Intravenous Daily   vitamin B-12  100 mcg Oral Daily   Continuous Infusions:  sodium chloride 10 mL/hr at 07/31/18 1433   sodium chloride 100 mL/hr at 08/08/18 0758   vancomycin 750 mg (08/09/18 0239)     LOS: 50 days        Alwyn RenElizabeth G Burkley Dech, MD [facility]   If 7PM-7AM, please contact night-coverage [email or web address] Password [email or web address] 08/09/2018, 8:51 AM

## 2018-08-09 NOTE — Progress Notes (Signed)
Occupational Therapy Treatment Patient Details Name: [name] MRN: [id number] DOB: 26-May-1965 Today's Date: 08/09/2018    History of present illness Pt. with PMH of hep C, substance abuse; admitted on 06/19/2018, presented with complaint of back pain, was found to have musculoskeletal back pain as well as right upper extremity cellulitis; Patient had a debridement of his shoulder perfromed on 5/20.    OT comments  Pt eager to d/c home tomorrow and expressing concern that DME has not arrived. CM aware that no DME was present at 2:15pm. Pt sitting eOB for medication and encourage RN staff to have patient sit up for all meds.   Follow Up Recommendations  Home health OT    Equipment Recommendations  Wheelchair cushion (measurements OT);Wheelchair (measurements OT);3 in 1 bedside commode    Recommendations for Other Services      Precautions / Restrictions Precautions Precautions: Shoulder Type of Shoulder Precautions: per 5/30 Dr dean notes it states AROM WBAT within tolerance Shoulder Interventions: Shoulder sling/immobilizer;For comfort Restrictions RUE Weight Bearing: Weight bearing as tolerated LLE Weight Bearing: Weight bearing as tolerated       Mobility Bed Mobility Overal bed mobility: Needs Assistance Bed Mobility: Supine to Sit     Supine to sit: Min assist Sit to supine: Min guard   General bed mobility comments: requires (A) to pull up into sitting due to air mattress. pt states I am stuck in this damn hole in this bed  Transfers Overall transfer level: Needs assistance Equipment used: Rolling walker (2 wheeled) Transfers: Sit to/from Stand Sit to Stand: Min assist;Mod assist         General transfer comment: previously walking with PT and declines ambulation in room    Balance Overall balance assessment: Needs assistance Sitting-balance support: Feet supported;Bilateral upper extremity supported Sitting balance-Leahy Scale: Fair     Standing  balance support: Bilateral upper extremity supported;Single extremity supported;No upper extremity supported Standing balance-Leahy Scale: Poor Standing balance comment: needs external support in standing, heavily reliant on UEs support.                            ADL either performed or assessed with clinical judgement   ADL Overall ADL's : Needs assistance/impaired Eating/Feeding: Independent   Grooming: Minimal assistance Grooming Details (indicate cue type and reason): ablet o comb R side of head                               General ADL Comments: educated on dressing UB and LB and able to demonstrate crossing supine. pt educated on shower transfer over 3 inch threshold with x6 grab bars     Vision       Perception     Praxis      Cognition Arousal/Alertness: Awake/alert Behavior During Therapy: WFL for tasks assessed/performed;Agitated Overall Cognitive Status: Within Functional Limits for tasks assessed                                 General Comments: just got news that he is not going home today, very frustrated and angry.        Exercises General Exercises - Upper Extremity Shoulder Flexion: AROM;Right;10 reps   Shoulder Instructions       General Comments called father during session. called RN for meds during session. Talked to CM during  session about DME. Pt pleasant and ready to d/c home    Pertinent Vitals/ Pain       Pain Assessment: No/denies pain Faces Pain Scale: Hurts even more Pain Location: low back  Pain Descriptors / Indicators: Grimacing;Guarding Pain Intervention(s): Limited activity within patient's tolerance;Monitored during session;Repositioned  Home Living                                          Prior Functioning/Environment              Frequency  Min 3X/week        Progress Toward Goals  OT Goals(current goals can now be found in the care plan section)   Progress towards OT goals: Progressing toward goals  Acute Rehab OT Goals Patient Stated Goal: to go home tomorrow OT Goal Formulation: With patient Time For Goal Achievement: 08/10/18 Potential to Achieve Goals: Good ADL Goals Pt Will Perform Grooming: with modified independence;sitting Pt Will Perform Upper Body Dressing: with modified independence;sitting Pt Will Perform Lower Body Dressing: with modified independence;sitting/lateral leans;sit to/from stand Pt Will Transfer to Toilet: with min guard assist;ambulating;bedside commode Pt/caregiver will Perform Home Exercise Program: Both right and left upper extremity;With Supervision;With written HEP provided Additional ADL Goal #1: Pt will sit EOB for ADL tasks with set-upA and fair sitting balance  Plan Discharge plan remains appropriate    Co-evaluation                 AM-PAC OT "6 Clicks" Daily Activity     Outcome Measure   Help from another person eating meals?: None Help from another person taking care of personal grooming?: None Help from another person toileting, which includes using toliet, bedpan, or urinal?: A Lot Help from another person bathing (including washing, rinsing, drying)?: A Lot Help from another person to put on and taking off regular upper body clothing?: A Lot Help from another person to put on and taking off regular lower body clothing?: A Lot 6 Click Score: 16    End of Session    OT Visit Diagnosis: Muscle weakness (generalized) (M62.81);Pain Pain - Right/Left: Right   Activity Tolerance Patient tolerated treatment well   Patient Left in bed;with call bell/phone within reach;with bed alarm set   Nurse Communication Mobility status;Precautions        Time: 1351-1450 OT Time Calculation (min): 59 min  Charges: OT General Charges $OT Visit: 1 Visit OT Treatments $Self Care/Home Management : 23-37 mins   Mateo FlowBrynn Bryan Goin, OTR/L  Acute Rehabilitation Services Pager:  [phone] Office: [phone] .    Mateo FlowBrynn Tynlee Bayle 08/09/2018, 3:31 PM

## 2018-08-09 NOTE — Progress Notes (Addendum)
Physical Therapy Treatment Patient Details Name: Clifford MassedJames Chan MRN: [id number] DOB: 08/25/1965 Today's Date: 08/09/2018    History of Present Illness Pt. with PMH of hep C, substance abuse; admitted on 06/19/2018, presented with complaint of back pain, was found to have musculoskeletal back pain as well as right upper extremity cellulitis; Patient had a debridement of his shoulder perfromed on 5/20.     PT Comments    Pt is upset that he cannot d/c home today.  He is worried about the bank and getting his house taken away.  He was able to get up and walk a shorter distance down the hallway today with supervision during gait with RW.  I pulled a chair along behind him, but he did not need it.  Per RN case manager, family to be here at 1300 tomorrow, so I will make sure to be present for questions.  PT will continue to follow acutely for safe mobility progression.   Goal update completed.  Follow Up Recommendations  Home health PT;Supervision for mobility/OOB     Equipment Recommendations  Rolling walker with 5" wheels;3in1 (PT);Wheelchair cushion (measurements PT);Wheelchair (measurements PT)    Recommendations for Other Services   NA     Precautions / Restrictions Precautions Precautions: Shoulder Type of Shoulder Precautions: per 5/30 Dr dean notes it states AROM WBAT within tolerance Shoulder Interventions: Shoulder sling/immobilizer;For comfort Restrictions RUE Weight Bearing: Weight bearing as tolerated LLE Weight Bearing: Weight bearing as tolerated    Mobility  Bed Mobility Overal bed mobility: Needs Assistance Bed Mobility: Supine to Sit;Sit to Supine     Supine to sit: Min assist Sit to supine: Min assist   General bed mobility comments: Min hand held assist to pull to sitting EOB, min assist to lightly lift both legs back into bed to retrun to supine.   Transfers Overall transfer level: Needs assistance Equipment used: Rolling walker (2 wheeled) Transfers: Sit  to/from Stand Sit to Stand: Min assist;Mod assist         General transfer comment: Min assist to support trunk to come to standing, mod assist to assist in controlled lower back down to sitting from standing.   Ambulation/Gait Ambulation/Gait assistance: Supervision Gait Distance (Feet): 100 Feet Assistive device: Rolling walker (2 wheeled) Gait Pattern/deviations: Step-to pattern;Antalgic;Trunk flexed Gait velocity: decreased Gait velocity interpretation: <1.8 ft/sec, indicate of risk for recurrent falls General Gait Details: Pt with moderately antalgic gait pattern, trunk flexed and pushing RW too far forward during gait, but pt reports he feels more stable this way despite cues to get closer and stand taller.  Chair pulled behind for safety, but not needed.  No reports of lightheadedness during gait, only pain.    Stairs Stairs: Yes       General stair comments: Pt reports he has a handicap ramp at home, so no current need to practice stairs.          Balance Overall balance assessment: Needs assistance Sitting-balance support: Feet supported;Bilateral upper extremity supported Sitting balance-Leahy Scale: Fair     Standing balance support: Bilateral upper extremity supported;Single extremity supported;No upper extremity supported Standing balance-Leahy Scale: Poor Standing balance comment: needs external support in standing, heavily reliant on UEs support.                             Cognition Arousal/Alertness: Awake/alert Behavior During Therapy: WFL for tasks assessed/performed;Agitated Overall Cognitive Status: Within Functional Limits for tasks assessed  General Comments: just got news that he is not going home today, very frustrated and angry.         General Comments General comments (skin integrity, edema, etc.): Pt very frustrated that he is not going home today.  I spoke with him about plans for  family education tomorrow and he stated, "I don't need family education!  I know how to move and I can tell my family how to help me!"  PT will still make sure to be available at 1300 tomorrow for planned family arrival.  I also spoke with him breifly about his R shoulder and dressing his right arm first when putting on a shirt (passed this along to OT as well).       Pertinent Vitals/Pain Pain Assessment: Faces Faces Pain Scale: Hurts even more Pain Location: low back  Pain Descriptors / Indicators: Grimacing;Guarding Pain Intervention(s): Limited activity within patient's tolerance;Monitored during session;Repositioned           PT Goals (current goals can now be found in the care plan section) Acute Rehab PT Goals Patient Stated Goal: to go home today PT Goal Formulation: With patient Time For Goal Achievement: 08/23/18 Potential to Achieve Goals: Good Progress towards PT goals: Progressing toward goals(goals reviewed and updated)    Frequency    Min 3X/week      PT Plan Current plan remains appropriate       AM-PAC PT "6 Clicks" Mobility   Outcome Measure  Help needed turning from your back to your side while in a flat bed without using bedrails?: A Little Help needed moving from lying on your back to sitting on the side of a flat bed without using bedrails?: A Little Help needed moving to and from a bed to a chair (including a wheelchair)?: A Little Help needed standing up from a chair using your arms (e.g., wheelchair or bedside chair)?: A Little Help needed to walk in hospital room?: A Little Help needed climbing 3-5 steps with a railing? : A Lot 6 Click Score: 17    End of Session Equipment Utilized During Treatment: Gait belt Activity Tolerance: Patient limited by pain;Patient limited by fatigue Patient left: in bed;with call bell/phone within reach   PT Visit Diagnosis: Unsteadiness on feet (R26.81);Muscle weakness (generalized) (M62.81);Pain;Other  abnormalities of gait and mobility (R26.89) Pain - Right/Left: Right Pain - part of body: Arm     Time: 0737-1062 PT Time Calculation (min) (ACUTE ONLY): 32 min  Charges:  $Gait Training: 8-22 mins $Therapeutic Activity: 8-22 mins            Senai Ramnath B. Nishika Parkhurst, PT, DPT  Acute Rehabilitation [phone] pager [phone] office  @ [place]: [phone]           08/09/2018, 1:58 PM

## 2018-08-09 NOTE — Progress Notes (Signed)
Orthopedic Tech Progress Note Patient Details:  [name] 07-25-1965 [id number]  CPM Left Knee CPM Left Knee: On Left Knee Flexion (Degrees): 90 Left Knee Extension (Degrees): 6 Additional Comments: patient can not tolerate flexion  Post Interventions Patient Tolerated: Well Instructions Provided: Care of device, Adjustment of device  [name] 08/09/2018, 4:44 PM

## 2018-08-10 MED ORDER — CYANOCOBALAMIN 100 MCG PO TABS: 100.0000 ug | ORAL_TABLET | Freq: Every day | ORAL | Status: DC

## 2018-08-10 MED ORDER — FERROUS SULFATE 325 (65 FE) MG PO TABS: 325.0000 mg | ORAL_TABLET | Freq: Two times a day (BID) | ORAL | 3 refills | Status: DC

## 2018-08-10 MED ORDER — DOXYCYCLINE HYCLATE 50 MG PO CAPS: 100.0000 mg | ORAL_CAPSULE | Freq: Two times a day (BID) | ORAL | 0 refills | Status: DC

## 2018-08-10 MED ORDER — OXYCODONE-ACETAMINOPHEN 5-325 MG PO TABS: 1.0000 | ORAL_TABLET | Freq: Three times a day (TID) | ORAL | 0 refills | Status: DC | PRN

## 2018-08-10 MED ORDER — OXYCODONE-ACETAMINOPHEN 5-325 MG PO TABS: 1.0000 | ORAL_TABLET | ORAL | 0 refills | Status: DC | PRN

## 2018-08-10 MED ORDER — DIAZEPAM 5 MG PO TABS: 5.0000 mg | ORAL_TABLET | Freq: Three times a day (TID) | ORAL | 0 refills | Status: DC | PRN

## 2018-08-10 MED ORDER — MORPHINE SULFATE (PF) 4 MG/ML IV SOLN
4.0000 mg | Freq: Once | INTRAVENOUS | Status: AC
  Administered 2018-08-10: 4 mg via INTRAVENOUS
  Filled 2018-08-10: qty 1

## 2018-08-10 MED ORDER — MORPHINE SULFATE (PF) 2 MG/ML IV SOLN: 2.0000 mg | INTRAVENOUS | Status: DC | PRN

## 2018-08-10 MED FILL — DOXYCYCLINE HYCLATE 100 MG: 100 | 30 days supply | Qty: 60 | Fill #0

## 2018-08-10 MED FILL — diazePAM 5 MG TABS: 5 | 10 days supply | Qty: 30 | Fill #0

## 2018-08-10 MED FILL — OXYCODONE W/APAP 5/325 TAB: 5-325 | 5 days supply | Qty: 30 | Fill #0

## 2018-08-10 NOTE — Progress Notes (Signed)
OT Cancellation Note  Patient Details Name: Nil Xiong MRN: [id number] DOB: May 04, 1965   Cancelled Treatment:    Reason Eval/Treat Not Completed: Patient declined, no reason specified(Pt denied need for patient/family education prior to dc.)   Darryl Nestle) Marsa Aris OTR/L [place] Pager: [phone] Office: [place] 08/10/2018, 4:22 PM

## 2018-08-10 NOTE — Progress Notes (Signed)
Patient ID: [name], male   DOB: [birth year], 53 y.o.   MRN: [id number]          [facility]    Date of Admission:  06/19/2018     [name] has completed 6 weeks of IV vancomycin for culture-negative polyarticular septic arthritis and vertebral infection.  He is doing better.  Repeat lumbar MRI revealed:  IMPRESSION: 1. Right sacroiliac joint septic arthritis. Persistent bone marrow edema and enhancement with increased fluid in the joint. There appears to be extension of infection into the right L4-5 facet joint as noted previously. Based on the current study, I doubt there is discitis and osteomyelitis at L4-5 or L5-S1. 2. Negative for epidural abscess in the spinal canal 3. Marked improvement in multiple soft tissue abscess is in the retroperitoneum on the right and in the right posterior paraspinous Muscles.  Recent inflammatory markers showed:  Sed Rate (mm/hr)  Date Value  08/01/2018 >140 (H)  06/20/2018 80 (H)   CRP (mg/dL)  Date Value  08/01/2018 24.0 (H)  06/20/2018 24.1 (H)   I recommend transitioning to oral doxycycline 100 mg twice daily for at least 1 more month.  I will see him back in clinic on 09/07/2018.          Michel Bickers, MD [facility] [phone] pager   [phone] cell 08/10/2018, 9:09 AM

## 2018-08-10 NOTE — Discharge Summary (Signed)
Physician Discharge Summary  Trason Shifflet HYI:[id number] DOB: 04/08/1965 DOA: 06/19/2018  PCP: Patient, No Pcp Per  Admit date: 06/19/2018 Discharge date: 08/10/2018  Admitted From: home Disposition: home  Recommendations for Outpatient Follow-up:  1. Follow up with PCP in 1-2 weeks 2. Please obtain BMP/CBC in one week 3. Please follow up with dr Megan Salon id   Home Healthyes Equipment/Devices:none  Discharge Condition stable CODE STATUS:full Diet recommendation:cardiac Brief/Interim Summary:53 year old male who presented with back pain. He does have significant past medical history for hepatitis C. He reported severe back pain, radiating down to his left leg,and associated with difficulty standing and walking. He was found to have a right upper extremity cellulitis and was started on IV antibiotics. Further work-up with CT scan revealed a intra-facial abscess of the right shoulder along with septic arthritis in the [facility] joint.His hospitalization was complicated by lumbar spine discitis,paraspinal abscess, andleftknee infected arthritis.  He has been treated with IV antibiotics, vancomycin since Jun 23, 2018 to August 09, 2018.   Discharge Diagnoses:  Principal Problem:   Septic arthritis of multiple joints ([place]) Active Problems:   Hepatitis C antibody test positive   Cigarette smoker   Intractable back pain   AKI (acute kidney injury) ([facility])   Spinal stenosis of lumbosacral region   GERD (gastroesophageal reflux disease)   Mild protein-calorie malnutrition (HCC)   Septic arthritis of knee, left (HCC)   Acute medial meniscal tear, left, initial encounter   Polysubstance abuse (HCC)   Normocytic anemia   Effusion of left elbow   Recurrent boils   Discitis of lumbosacral region   Psoas abscess, right (HCC)   Multiple abscesses of right shoulder   Chronic infection of left knee (HCC)  1Multiple jointsseptic arthritis, left elbow, right shoulder, discitis, lumbar spine  abscess, leftknee-patient was treated with 6 weeks of IV antibiotics through his PICC line. Last day of vancomycin was August 09, 2018.  Follow-up MRI after treatment shows right sacroiliac joint septic arthritis Persistent bone marrow edema and enhancement with increased fluid in the joint. There appears to be extension of infection into the right L4-5 facet joint as noted previously. Based on the current study, I doubt there is discitis and osteomyelitis at L4-5 or L5-S1.  Negative for epidural abscess in the spinal canal  Marked improvement in multiple soft tissue abscess is in the retroperitoneum on the right and in the right posterior paraspinous muscles.  2.Chronic anemia with iron deficiency-patient received blood transfusion prior to discharge and his hemoglobin is 8.1  3.Protein calorie malnutrition. Continue nutritional supplements on discharge.   4.Polysubstance abuse.  Encourage patient and counseled him on abstinence of polysubstance abuse.  He has made up his mind not to go back on any IV drugs.    5.Hypertension.  He was treated with metoprolol intermittently however his blood pressure is too soft at this time to put him back on metoprolol dates need to be monitored as an outpatient and restarted if needed.  6.Pneumonia with left pleural effusion.  Treated with Zosyn.    7reactive thrombocytosis follow follow-up as an outpatient  Estimated body mass index is 23.68 kg/m as calculated from the following:   Height as of this encounter: '5\' 10"'  (1.778 m).   Weight as of this encounter: 74.8 kg.  Discharge Instructions  Discharge Instructions    Home infusion instructions [facility] May follow [place] Dosing Protocol; May administer Cathflo as needed to maintain patency of vascular access device.; Flushing of vascular access device:  per [facility] Protocol: 0.9% NaCl pre/post medica...   Complete by: As directed    Instructions: May follow [place]  Dosing Protocol   Instructions: May administer Cathflo as needed to maintain patency of vascular access device.   Instructions: Flushing of vascular access device: per [facility] Protocol: 0.9% NaCl pre/post medication administration and prn patency; Heparin 100 u/ml, 52m for implanted ports and Heparin 10u/ml, 527mfor all other central venous catheters.   Instructions: May follow [facility] Anaphylaxis Protocol for First Dose Administration in the home: 0.9% NaCl at 25-50 ml/hr to maintain IV access for protocol meds. Epinephrine 0.3 ml IV/IM PRN and Benadryl 25-50 IV/IM PRN s/s of anaphylaxis.   Instructions: [facility] Coordinator (RN) to assist per patient IV care needs in the home PRN.     Allergies as of 08/10/2018   No Known Allergies     Medication List    STOP taking these medications   HYDROcodone-acetaminophen 5-325 MG tablet Commonly known as: NORCO/VICODIN   oxyCODONE-acetaminophen 5-325 MG tablet Commonly known as: PERCOCET/ROXICET   predniSONE 10 MG tablet Commonly known as: DELTASONE     TAKE these medications   cyanocobalamin 100 MCG tablet Take 1 tablet (100 mcg total) by mouth daily.   cyclobenzaprine 10 MG tablet Commonly known as: FLEXERIL Take 1 tablet (10 mg total) by mouth 3 (three) times daily as needed for muscle spasms.   diazepam 5 MG tablet Commonly known as: VALIUM Take 1 tablet (5 mg total) by mouth every 8 (eight) hours as needed for anxiety (vertigo).   ferrous sulfate 325 (65 FE) MG tablet Take 1 tablet (325 mg total) by mouth 2 (two) times daily with a meal.   oxyCODONE-acetaminophen 5-325 MG tablet Commonly known as: PERCOCET/ROXICET Take 1 tablet by mouth every 8 (eight) hours as needed for severe pain.     ASK your doctor about these medications   oxyCODONE-acetaminophen 5-325 MG tablet Commonly known as: PERCOCET/ROXICET Take 1 tablet by mouth every 4 (four) hours as needed for up to 5 days. Ask about: Should I take this  medication?   vancomycin  IVPB Inject 750 mg into the vein every 12 (twelve) hours. Indication:  Septic knee infection Last Day of Therapy:  08/09/2018 Labs - Sunday/Monday:  CBC/D, BMP, and vancomycin trough. Labs - Thursday:  BMP and vancomycin trough Labs - Every other week:  ESR and CRP Ask about: Should I take this medication?            Home Infusion Instuctions  (From admission, onward)         Start     Ordered   06/26/18 0000  Home infusion instructions [facility] May follow [facility]; May administer Cathflo as needed to maintain patency of vascular access device.; Flushing of vascular access [facility]: 0.9% NaCl pre/post medica...    Question Answer Comment  Instructions May follow [facility] Protocol   Instructions May administer Cathflo as needed to maintain patency of vascular access device.   Instructions Flushing of vascular access device: [facility]: 0.9% NaCl pre/post medication administration and prn patency; Heparin 100 u/ml, 92m12mor implanted ports and Heparin 10u/ml, 92ml28mr all other central venous catheters.   Instructions May follow [facility] Anaphylaxis Protocol for First Dose Administration in the home: 0.9% NaCl at 25-50 ml/hr to maintain IV access for protocol meds. Epinephrine 0.3 ml IV/IM PRN and Benadryl 25-50 IV/IM PRN s/s of anaphylaxis.   Instructions [facility] Infusion Coordinator (RN) to assist per patient IV care needs in the home PRN.      06/26/18 1616           Durable Medical Equipment  (From admission, onward)         Start     Ordered   08/07/18 1254  For home use only DME standard manual wheelchair with seat cushion  Once    Comments: Patient suffers from diskitis ,septic arthritis   which impairs their ability to perform daily activities like ADLs  in the home.  A walker  will not resolve issue with performing activities of daily living. A wheelchair will allow patient to  safely perform daily activities. Patient can safely propel the wheelchair in the home or has a caregiver who can provide assistance. Length of need life long  Accessories: elevating leg rests (ELRs), wheel locks, extensions and anti-tippers.   08/07/18 1255   08/07/18 1222  For home use only DME Wheelchair electric  Once     08/07/18 1221   08/07/18 1151  For home use only DME wheelchair cushion (seat and back)  Once     08/07/18 1150   08/07/18 1150  For home use only DME Hospital bed  Once    Question Answer Comment  Length of Need Lifetime   The above medical condition requires: Patient requires the ability to reposition frequently   Head must be elevated greater than: 45 degrees   Bed type Semi-electric      08/07/18 1150   06/26/18 1439  For home use only DME Walker rolling  Once    Question:  Patient needs a walker to treat with the following condition  Answer:  Balance problem   06/26/18 1438         Follow-up [facility] in [place] Follow up.   Why: You will have to call and they can email you an application or you can go by and pick one up. they will go over the application and schedule you for an appointment. they will also have you fill out application for med assistance. Contact information: [place]       [facility], [facility]. Schedule an appointment as soon as possible for a visit.   Why: Call to schedule a telehealth appointment for outpatient substance abuse treatment.  Contact information: [place] [address] [phone]        Michel Bickers, MD Follow up on 08/07/2018.   Specialty: Infectious Diseases Why: please call for appointment Contact information: [address] [place] [address] [phone]          No Known Allergies  Consultations:  Dr. Megan Salon infectious disease   Procedures/Studies: Ct Head Wo Contrast  Result Date: 07/26/2018 CLINICAL  DATA:  Recent surgery, vertigo EXAM: CT HEAD WITHOUT CONTRAST TECHNIQUE: Contiguous axial images were obtained from the base of the skull through the vertex without intravenous contrast. COMPARISON:  None. FINDINGS: Brain: No evidence of acute infarction, hemorrhage, hydrocephalus, extra-axial collection or mass lesion/mass effect. Vascular: No hyperdense vessel or unexpected calcification. Skull: Normal. Negative for fracture or focal lesion. Sinuses/Orbits: No acute finding. Other: None. IMPRESSION: No acute intracranial pathology. Electronically Signed   By: Eddie Candle M.D.   On: 07/26/2018 13:34   Ct Angio Chest Pe W Or Wo Contrast  Result Date: 08/01/2018 CLINICAL DATA:  Chest pain EXAM: CT ANGIOGRAPHY CHEST WITH CONTRAST TECHNIQUE: Multidetector CT  imaging of the chest was performed using the standard protocol during bolus administration of intravenous contrast. Multiplanar CT image reconstructions and MIPs were obtained to evaluate the vascular anatomy. CONTRAST:  53m OMNIPAQUE IOHEXOL 350 MG/ML SOLN COMPARISON:  Chest radiograph July 30, 2018 FINDINGS: Cardiovascular: There is no demonstrable pulmonary embolus. There is no thoracic aortic aneurysm or dissection. Visualized great vessels appear unremarkable. There is no pericardial effusion or pericardial thickening. Central catheter tip is in the superior vena cava. Mediastinum/Nodes: Visualized thyroid appears unremarkable. There are scattered subcentimeter mediastinal lymph nodes. No adenopathy by size criteria evident. No esophageal lesions are appreciable. Lungs/Pleura: There is a sizable partially loculated the largely free-flowing pleural effusion on the left. There is consolidation in the left lower lobe, in part due to compressive atelectasis. On the right, there is airspace consolidation with atelectasis in the posterior segment of the right lower lobe. To a lesser extent, there is consolidation in portions of the superior segment of the  right lower lobe. On axial slice 79 series 6, there is a 5 mm nodular opacity abutting the pleura in the lateral segment of the right lower lobe. Upper Abdomen: Visualized upper abdominal structures appear unremarkable. Musculoskeletal: There are no blastic or lytic bone lesions. Soft tissue fullness in the right shoulder region is noted, incompletely visualized. There is a partially calcified masslike area in the right subscapularis muscle measuring 4.3 x 3.1 x 2.5 cm. Review of the MIP images confirms the above findings. IMPRESSION: 1. No demonstrable pulmonary embolus. No thoracic aortic aneurysm or dissection. 2. Sizable partially loculated left pleural effusion. Areas of consolidation and compressive atelectasis in the left lower lobe involving a portion of the posterior segment of the left upper lobe. Suspect combination of pneumonia and atelectasis in these areas. 3. Airspace consolidation and atelectasis in the right lower lobe, primarily in the posterior segment. 4. 5 mm nodular opacity right lower lobe lateral segment. No follow-up needed if patient is low-risk. Non-contrast chest CT can be considered in 12 months if patient is high-risk. This recommendation follows the consensus statement: Guidelines for Management of Incidental Pulmonary Nodules Detected on CT Images: From the Fleischner Society 2017; Radiology 2017; 284:228-243. 5. Masslike area, partially calcified, in the right subscapularis muscle. Question inflammatory etiology for this lesion. 6. Incomplete visualization of soft tissue fullness in the right shoulder region. 7.  No demonstrable thoracic adenopathy. Electronically Signed   By: WLowella GripIII M.D.   On: 08/01/2018 08:59   Mr Lumbar Spine W Wo Contrast  Result Date: 08/09/2018 CLINICAL DATA:  Follow-up spinal infection. EXAM: MRI LUMBAR SPINE WITHOUT AND WITH CONTRAST TECHNIQUE: Multiplanar and multiecho pulse sequences of the lumbar spine were obtained without and with  intravenous contrast. CONTRAST:  7.5 mL Gadovist IV COMPARISON:  Lumbar MRI 06/13/2018, 06/27/2018 FINDINGS: Segmentation:  Normal Alignment: 9 mm anterolisthesis L5-S1 unchanged. Slight retrolisthesis L4-5. Vertebrae:  Negative for vertebral body fracture. Bone marrow edema and bone marrow enhancement right SI joint. Increased fluid in the right SI joint compatible septic arthritis. Conus medullaris and cauda equina: Conus extends to the L1-2 level. Conus and cauda equina appear normal. Paraspinal and other soft tissues: Marked improvement in multiple retroperitoneal abscesses on the right involving the right psoas muscle, iliacus muscle, and adjacent soft tissues. Also improvement in abscesses in the right posterior paraspinous muscles. These muscles remain edematous with enhancement. Negative for epidural enhancement. Disc levels: L1-2: Negative L2-3: Negative L3-4: Mild disc and facet degeneration.  Negative for stenosis L4-5: Small central  annular fissure with small disc protrusion. Negative for stenosis. Bone marrow edema and enhancement around the right facet joint most compatible with infection related to paraspinous muscle infection and direct extension. No definite discitis or epidural abscess L5-S1: 9 mm anterolisthesis with bilateral pars defects of L5. Severe foraminal encroachment bilaterally is unchanged. Increased fluid in the disc space may be degenerative. There is no abnormal bone marrow enhancement in the endplates to suggest infection at this level. In fact, there are fatty changes in the endplates of L5 and S1 suggesting degenerative change without edema. IMPRESSION: 1. Right sacroiliac joint septic arthritis. Persistent bone marrow edema and enhancement with increased fluid in the joint. There appears to be extension of infection into the right L4-5 facet joint as noted previously. Based on the current study, I doubt there is discitis and osteomyelitis at L4-5 or L5-S1. 2. Negative for  epidural abscess in the spinal canal 3. Marked improvement in multiple soft tissue abscess is in the retroperitoneum on the right and in the right posterior paraspinous muscles. Electronically Signed   By: Franchot Gallo M.D.   On: 08/09/2018 12:01   Dg Chest Port 1 View  Result Date: 07/30/2018 CLINICAL DATA:  PICC placement EXAM: PORTABLE CHEST 1 VIEW COMPARISON:  06/19/2018 FINDINGS: Left arm PICC tip at the cavoatrial junction in good position. Interval development of left lower lobe consolidation and small left effusion. Possible pneumonia. Right lung clear. Negative for heart failure or edema. IMPRESSION: PICC tip at the cavoatrial junction in good position Extensive consolidation left lower lobe with left pleural effusion. Possible pneumonia. Electronically Signed   By: Franchot Gallo M.D.   On: 07/30/2018 12:09    (Echo, Carotid, EGD, Colonoscopy, ERCP)    Subjective:  Very tearful and thankful and anxious to go home Discharge Exam: Vitals:   08/10/18 0100 08/10/18 0318  BP: 119/66 114/62  Pulse: 96 95  Resp: 16 18  Temp: 98.1 F (36.7 C) 98.2 F (36.8 C)  SpO2: 96% 94%   Vitals:   08/09/18 1520 08/09/18 2023 08/10/18 0100 08/10/18 0318  BP: 126/73 108/68 119/66 114/62  Pulse: (!) 106 98 96 95  Resp: '14 15 16 18  ' Temp: 98 F (36.7 C) 98 F (36.7 C) 98.1 F (36.7 C) 98.2 F (36.8 C)  TempSrc: Oral Oral Oral   SpO2: 95% 95% 96% 94%  Weight:      Height:        General: Pt is alert, awake, not in acute distress Cardiovascular: RRR, S1/S2 +, no rubs, no gallops Respiratory: CTA bilaterally, no wheezing, no rhonchi Abdominal: Soft, NT, ND, bowel sounds + Extremities: no edema, no cyanosis    The results of significant diagnostics from this hospitalization (including imaging, microbiology, ancillary and laboratory) are listed below for reference.     Microbiology: No results found for this or any previous visit (from the past 240 hour(s)).   Labs: BNP (last 3  results) No results for input(s): BNP in the last 8760 hours. Basic Metabolic Panel: Recent Labs  Lab 08/03/18 1400 08/05/18 0728 08/08/18 0446  NA 137 137 137  K 3.4* 3.9 4.0  CL 103 99 102  CO2 '23 25 27  ' GLUCOSE 99 99 106*  BUN '13 12 13  ' CREATININE 0.66 0.74 0.71  CALCIUM 7.4* 8.9 8.7*   Liver Function Tests: Recent Labs  Lab 08/08/18 0446  AST 18  ALT 40  ALKPHOS 122  BILITOT 0.4  PROT 6.2*  ALBUMIN 1.6*   No  results for input(s): LIPASE, AMYLASE in the last 168 hours. No results for input(s): AMMONIA in the last 168 hours. CBC: Recent Labs  Lab 08/05/18 0728 08/08/18 0446 08/09/18 1324  WBC 10.6* 10.4 11.5*  NEUTROABS 6.4 6.6 6.7  HGB 7.4* 6.5* 8.1*  HCT 25.6* 21.7* 26.8*  MCV 83.9 81.6 83.5  PLT 690* 679* 738*   Cardiac Enzymes: No results for input(s): CKTOTAL, CKMB, CKMBINDEX, TROPONINI in the last 168 hours. BNP: Invalid input(s): POCBNP CBG: No results for input(s): GLUCAP in the last 168 hours. D-Dimer No results for input(s): DDIMER in the last 72 hours. Hgb A1c No results for input(s): HGBA1C in the last 72 hours. Lipid Profile No results for input(s): CHOL, HDL, LDLCALC, TRIG, CHOLHDL, LDLDIRECT in the last 72 hours. Thyroid function studies No results for input(s): TSH, T4TOTAL, T3FREE, THYROIDAB in the last 72 hours.  Invalid input(s): FREET3 Anemia work up No results for input(s): VITAMINB12, FOLATE, FERRITIN, TIBC, IRON, RETICCTPCT in the last 72 hours. Urinalysis    Component Value Date/Time   COLORURINE YELLOW 07/16/2018 1248   APPEARANCEUR CLEAR 07/16/2018 1248   LABSPEC 1.013 07/16/2018 1248   PHURINE 6.0 07/16/2018 1248   GLUCOSEU NEGATIVE 07/16/2018 1248   HGBUR NEGATIVE 07/16/2018 [place] 07/16/2018 1248   KETONESUR NEGATIVE 07/16/2018 [place] 07/16/2018 1248   NITRITE NEGATIVE 07/16/2018 1248   LEUKOCYTESUR NEGATIVE 07/16/2018 1248   Sepsis Labs Invalid input(s): PROCALCITONIN,   WBC,  LACTICIDVEN Microbiology No results found for this or any previous visit (from the past 240 hour(s)).   Time coordinating discharge:  35 minutes  SIGNED:   Georgette Shell, MD  [facility] 08/10/2018, 8:39 AM Pager   If 7PM-7AM, please contact night-coverage [email or web address] Password [email or web address]

## 2018-08-10 NOTE — Progress Notes (Signed)
08/10/2018 PT Note: Attempted pt/family d/c education.  Everything I attempted to educate pt/family was met with "Do you think I am stupid?" from [name], so I terminated session early and told OT not to go.  He will have home therapy follow up and I encouraged his family to use them as a resource if any questions come up.  RN informed and pt planning to d/c home soon.  Thanks,  [name]. [name], PT, DPT  Acute Rehabilitation [phone] pager [phone]) [phone] office  @ [email or web address]: [phone]

## 2018-08-10 NOTE — Progress Notes (Signed)
Patient was told that he was going to be getting his last dose of iv antibiotic tonight at 2am but no medication was scheduled at that time.  I called pharmacy and spoke with Jeneen Rinks who doubled checked and said his iv antibiotics were completed this afternoon and there was none due at this time.  Spoke with patient and will follow up with discharge in the am

## 2018-08-10 NOTE — TOC Progression Note (Addendum)
Transition of Care [facility]) - Progression Note    Patient Details  Name: [name] MRN: [id number] Date of Birth: 04/04/1965  [facility]) CM/SW Contact  Jacalyn Lefevre Edson Snowball, RN Phone Number: 08/10/2018, 9:52 AM  Clinical Narrative:      Entered in [facility], prescriptions sent to [facility] and will be brought to room. [facility] will not cover Percocet or Valium.  Left Butch Penny with [facility]  Message discharge is today . Butch Penny called back given orders for HHPT,OT aide, Butch Penny requesting order for [facility] social worker . Expected Discharge Plan: [place] Barriers to Discharge: Inadequate or no insurance  Expected Discharge Plan and Services Expected Discharge Plan: [place] arrangements for the past 2 months: Single Family Home(one level) Expected Discharge Date: 08/10/18                                     Social Determinants of Health (SDOH) Interventions    Readmission Risk Interventions Readmission Risk Prevention Plan 06/28/2018  Transportation Screening Complete  HRI or [place] Complete  Social Work Consult for [place] Planning/Counseling Complete  Palliative Care Screening Not Applicable  Medication Review [employer]) Referral to Pharmacy  Some recent data might be hidden

## 2018-08-15 ENCOUNTER — Telehealth: Payer: Self-pay | Admitting: Orthopedic Surgery

## 2018-08-15 NOTE — Telephone Encounter (Signed)
duplicate

## 2018-08-15 NOTE — Telephone Encounter (Signed)
No restriction on range of motion of right shoulder no restriction on range of motion and strengthening left knee

## 2018-08-15 NOTE — Telephone Encounter (Signed)
Debbie from [facility] called requesting PT verbal orders for patient.  2 times/week for 1 week, 1 time/week for 2 weeks.  No ortho precautions on any extremity. Please call back at [phone]

## 2018-08-15 NOTE — Telephone Encounter (Signed)
Please advise if any restrictions.

## 2018-08-16 NOTE — Telephone Encounter (Signed)
IC advised per Dr Marlou Sa She wanted to make sure you were aware heart rate 104 at rest Sees PCP next week. They will be monitoring this as well No SOB

## 2018-08-17 ENCOUNTER — Telehealth: Payer: Self-pay

## 2018-08-17 NOTE — Telephone Encounter (Signed)
IC advised ok for orders also advised per previous note on 07/07 for restrictions.

## 2018-08-17 NOTE — Telephone Encounter (Signed)
[name] with [facility] would like verbal orders for 1 x week for 4 weeks for Occupational Therapy and would like to know if patient has any restrictions on movement for his right shoulder or a protocol for patient's right shoulder?  Cb# is [phone].  Please advise.  Thank you.

## 2018-08-24 ENCOUNTER — Telehealth: Payer: Self-pay

## 2018-08-24 NOTE — Telephone Encounter (Signed)
Please advise. Thanks.  

## 2018-08-24 NOTE — Telephone Encounter (Signed)
[name], Occupational Therapist with [facility] called stating that patient is having a burning pain in his right shoulder that is radiating into his right side.  Stated that he has being having this pain for about 3-4 days and it's not constant, but at certain times.  [name]'s CB# is [phone] Patient would like Rx refills on Oxycodone, Valium, and Flexeril.  Cb# is [phone].  Please advise.  Thank you.

## 2018-08-26 NOTE — Telephone Encounter (Signed)
Ok to rf all but valium

## 2018-08-28 ENCOUNTER — Encounter: Payer: Self-pay | Admitting: Orthopedic Surgery

## 2018-08-28 ENCOUNTER — Other Ambulatory Visit: Payer: Self-pay | Admitting: Surgical

## 2018-08-28 ENCOUNTER — Ambulatory Visit (INDEPENDENT_AMBULATORY_CARE_PROVIDER_SITE_OTHER): Payer: Self-pay | Admitting: Orthopedic Surgery

## 2018-08-28 DIAGNOSIS — M01X9 Direct infection of multiple joints in infectious and parasitic diseases classified elsewhere: Secondary | ICD-10-CM

## 2018-08-28 DIAGNOSIS — M0089 Polyarthritis due to other bacteria: Secondary | ICD-10-CM

## 2018-08-28 DIAGNOSIS — A498 Other bacterial infections of unspecified site: Secondary | ICD-10-CM

## 2018-08-28 MED ORDER — CYCLOBENZAPRINE HCL 10 MG PO TABS: 10.0000 mg | ORAL_TABLET | Freq: Three times a day (TID) | ORAL | 0 refills | Status: DC | PRN

## 2018-08-28 MED ORDER — OXYCODONE-ACETAMINOPHEN 5-325 MG PO TABS: 1.0000 | ORAL_TABLET | Freq: Four times a day (QID) | ORAL | 0 refills | Status: DC | PRN

## 2018-08-28 NOTE — Telephone Encounter (Signed)
I sent in flexeril. Can you please submit the oxycodone.

## 2018-08-28 NOTE — Addendum Note (Signed)
Addended byLaurann Montana on: 08/28/2018 09:18 AM   Modules accepted: Orders

## 2018-08-31 ENCOUNTER — Encounter: Payer: Self-pay | Admitting: Orthopedic Surgery

## 2018-08-31 NOTE — Progress Notes (Signed)
Post-Op Visit Note   Patient: [name]           Date of Birth: 07/18/1965           MRN: [id number] Visit Date: 08/28/2018 PCP: Patient, No Pcp Per   Assessment & Plan:  Chief Complaint:  Chief Complaint  Patient presents with  . Right Shoulder - Routine Post Op  . Left Knee - Routine Post Op   Visit Diagnoses:  1. Arthritis of multiple sites due to other bacteria [facility](HCC)     Plan: Clifford FearingJames is a patient who underwent I&D right shoulder 06/28/2018 and I&D left knee 06/23/2022 infection.  In general his shoulder and knee are doing reasonably well.  CPM machine helped his knee.  His shoulder is improved significantly.  He still is having pretty severe back pain.  The back was managed by another service while he was in the hospital.  He is going to see Clifford AstersJohn Chan in infectious disease in the near future.  He is interested in pain medicine renewal today.  He is not having any fevers or chills.  On exam he has good active and passive range of motion of the right shoulder.  Lacking about 20 degrees of full forward flexion abduction but his cuff strength is good and there is no lymphadenopathy in the incisions of healed.  Left knee has no effusion but some synovitis is present.  He is able to bend it to just past 90 degrees.  Much in the way of warmth difference versus right knee.  Impression is that he is doing well from his extremity surgeries.  He needs more follow-up on the back because of the infection which was present in that region.  He will be seeing Dr. Orvan Chan in the near future.  Follow-up with me as needed.  He is getting home health physical therapy which is helping him with his knee and shoulder.  Follow-Up Instructions: Return if symptoms worsen or fail to improve.   Orders:  Orders Placed This Encounter  Procedures  . Ambulatory referral to Infectious Disease   No orders of the defined types were placed in this encounter.   Imaging: No results found.  PMFS  History: Patient Active Problem List   Diagnosis Date Noted  . Discitis of lumbosacral region 06/28/2018  . Psoas abscess, right (HCC) 06/28/2018  . Multiple abscesses of right shoulder 06/28/2018  . Septic arthritis of multiple joints (HCC) 06/28/2018  . Chronic infection of left knee (HCC) 06/28/2018  . Polysubstance abuse (HCC) 06/24/2018  . Normocytic anemia 06/24/2018  . Effusion of left elbow 06/24/2018  . Recurrent boils 06/24/2018  . Head injury   . Septic arthritis of knee, left (HCC)   . Acute medial meniscal tear, left, initial encounter   . GERD (gastroesophageal reflux disease) 06/22/2018  . Mild protein-calorie malnutrition (HCC) 06/22/2018  . AKI (acute kidney injury) (HCC) 06/21/2018  . Spinal stenosis of lumbosacral region 06/21/2018  . Intractable back pain 06/19/2018  . Hepatitis C antibody test positive 12/17/2013  . Cigarette smoker 12/17/2013  . Family history of diabetes mellitus (DM) 12/17/2013  . Notalgia 12/17/2013   Past Medical History:  Diagnosis Date  . Acid reflux   . Head injury   . Hepatitis C   . Hiatal hernia     Family History  Problem Relation Age of Onset  . Diabetes Mother   . Hypertension Mother   . Hypertension Father     Past Surgical History:  Procedure  Laterality Date  . ACROMIO-CLAVICULAR JOINT REPAIR Right 06/28/2018   Procedure: ACROMIO-CLAVICULAR JOINT IRRIGATION AND DEBRIDEMENT;  Surgeon: [name], MD;  Location: [place];  Service: Orthopedics;  Laterality: Right;  . FACIAL FRACTURE SURGERY    . IRRIGATION AND DEBRIDEMENT SHOULDER Right 06/28/2018   Procedure: IRRIGATION AND DEBRIDEMENT SHOULDER;  Surgeon: [name], MD;  Location: [place];  Service: Orthopedics;  Laterality: Right;  . KNEE ARTHROSCOPY Left 06/23/2018   Procedure: LEFT KNEE ARTHROSCOPY KNEE I&D.;  Surgeon: [name], MD;  Location: [place];  Service: Orthopedics;  Laterality: Left;   Social History   Occupational History  . Not  on file  Tobacco Use  . Smoking status: Current Every Day Smoker    Packs/day: 1.00    Years: 20.00    Pack years: 20.00    Types: Cigarettes  . Smokeless tobacco: Never Used  Substance and Sexual Activity  . Alcohol use: Not Currently  . Drug use: Yes    Types: Cocaine    Comment: admits to last use 2-3 weeks ago  . Sexual activity: Not on file

## 2018-09-01 ENCOUNTER — Telehealth: Payer: Self-pay

## 2018-09-01 NOTE — Telephone Encounter (Signed)
FYI

## 2018-09-01 NOTE — Telephone Encounter (Signed)
[name] with [facility] wanted to let Dr. Marlou Sa know that patient had a fall on Tuesday, 08/29/2018.  Stated that he tripped while vacuuming.  No injury, just pain in tail bone and hip, but stated that it was discussed at his office visit on Monday, 08/28/2018 with Dr. Marlou Sa.  Wendelyn Breslow stated that she has talked with patient about safety.  Cb# for patient is [phone].  Thank you.

## 2018-09-05 ENCOUNTER — Emergency Department (HOSPITAL_COMMUNITY): Payer: Medicaid Other

## 2018-09-05 ENCOUNTER — Other Ambulatory Visit: Payer: Self-pay

## 2018-09-05 ENCOUNTER — Emergency Department (HOSPITAL_COMMUNITY)
Admission: EM | Admit: 2018-09-05 | Discharge: 2018-09-05 | Disposition: A | Discharge status: Home / Self Care | Payer: Medicaid Other | Attending: Emergency Medicine | Admitting: Emergency Medicine

## 2018-09-05 ENCOUNTER — Encounter (HOSPITAL_COMMUNITY): Payer: Self-pay | Admitting: Emergency Medicine

## 2018-09-05 DIAGNOSIS — R55 Syncope and collapse: Secondary | ICD-10-CM | POA: Diagnosis not present

## 2018-09-05 DIAGNOSIS — E860 Dehydration: Secondary | ICD-10-CM | POA: Diagnosis not present

## 2018-09-05 DIAGNOSIS — R911 Solitary pulmonary nodule: Secondary | ICD-10-CM | POA: Diagnosis not present

## 2018-09-05 DIAGNOSIS — F17210 Nicotine dependence, cigarettes, uncomplicated: Secondary | ICD-10-CM | POA: Diagnosis not present

## 2018-09-05 DIAGNOSIS — Z20828 Contact with and (suspected) exposure to other viral communicable diseases: Secondary | ICD-10-CM | POA: Diagnosis not present

## 2018-09-05 DIAGNOSIS — Z79899 Other long term (current) drug therapy: Secondary | ICD-10-CM | POA: Diagnosis not present

## 2018-09-05 LAB — CBC WITH DIFFERENTIAL/PLATELET
Abs Immature Granulocytes: 0.1 10*3/uL — ABNORMAL HIGH (ref 0.00–0.07)
Basophils Absolute: 0.1 10*3/uL (ref 0.0–0.1)
Basophils Relative: 1 %
Eosinophils Absolute: 0.1 10*3/uL (ref 0.0–0.5)
Eosinophils Relative: 1 %
HCT: 34.2 % — ABNORMAL LOW (ref 39.0–52.0)
Hemoglobin: 10 g/dL — ABNORMAL LOW (ref 13.0–17.0)
Immature Granulocytes: 1 %
Lymphocytes Relative: 16 %
Lymphs Abs: 2 10*3/uL (ref 0.7–4.0)
MCH: 23.8 pg — ABNORMAL LOW (ref 26.0–34.0)
MCHC: 29.2 g/dL — ABNORMAL LOW (ref 30.0–36.0)
MCV: 81.4 fL (ref 80.0–100.0)
Monocytes Absolute: 0.5 10*3/uL (ref 0.1–1.0)
Monocytes Relative: 4 %
Neutro Abs: 9.8 10*3/uL — ABNORMAL HIGH (ref 1.7–7.7)
Neutrophils Relative %: 77 %
Platelets: 433 10*3/uL — ABNORMAL HIGH (ref 150–400)
RBC: 4.2 MIL/uL — ABNORMAL LOW (ref 4.22–5.81)
RDW: 19.9 % — ABNORMAL HIGH (ref 11.5–15.5)
WBC: 12.5 10*3/uL — ABNORMAL HIGH (ref 4.0–10.5)
nRBC: 0 % (ref 0.0–0.2)

## 2018-09-05 LAB — BASIC METABOLIC PANEL
Anion gap: 7 (ref 5–15)
BUN: 19 mg/dL (ref 6–20)
CO2: 24 mmol/L (ref 22–32)
Calcium: 8.8 mg/dL — ABNORMAL LOW (ref 8.9–10.3)
Chloride: 103 mmol/L (ref 98–111)
Creatinine, Ser: 0.73 mg/dL (ref 0.61–1.24)
GFR calc Af Amer: >60 mL/min (ref >60)
GFR calc non Af Amer: >60 mL/min (ref >60)
Glucose, Bld: 99 mg/dL (ref 70–99)
Potassium: 3.5 mmol/L (ref 3.5–5.1)
Sodium: 134 mmol/L — ABNORMAL LOW (ref 135–145)

## 2018-09-05 LAB — TROPONIN I (HIGH SENSITIVITY)
Troponin I (High Sensitivity): 12 ng/L (ref <18)
Troponin I (High Sensitivity): 9 ng/L (ref <18)

## 2018-09-05 LAB — D-DIMER, QUANTITATIVE: D-Dimer, Quant: 3.68 ug/mL-FEU — ABNORMAL HIGH (ref 0.00–0.50)

## 2018-09-05 LAB — SARS CORONAVIRUS 2 BY RT PCR (HOSPITAL ORDER, PERFORMED IN ~~LOC~~ HOSPITAL LAB): SARS Coronavirus 2: NEGATIVE

## 2018-09-05 MED ORDER — IOHEXOL 350 MG/ML SOLN
100.0000 mL | Freq: Once | INTRAVENOUS | Status: AC | PRN
  Administered 2018-09-05: 05:00:00 100 mL via INTRAVENOUS

## 2018-09-05 MED ORDER — SODIUM CHLORIDE 0.9 % IV BOLUS
1000.0000 mL | Freq: Once | INTRAVENOUS | Status: AC
  Administered 2018-09-05: 1000 mL via INTRAVENOUS

## 2018-09-05 NOTE — ED Provider Notes (Signed)
[facility] Provider Note   CSN: [id number] Arrival date & time: 09/05/18  0008    History   Chief Complaint Chief Complaint  Patient presents with  . Loss of Consciousness    HPI [name] is a 53 y.o. male.     HPI  This a 53 year old male with history of hepatitis C, polysubstance abuse, recent history of prolonged hospitalization for septic arthritis of multiple joints requiring IV antibiotics who presents with syncope.  Patient reports that tonight he has had multiple episodes of syncope.  He states that he felt well until 7 PM when he ate dinner.  After that he "began to feel chilled and passed out."  He reports similar symptoms when he was in the hospital and "very sick."  He also reports similar symptoms with anemia.  Reports dizziness prior to passing out.  He denies any new joint pain or unchanged pain.  He denies any chest pain.  He does report shortness of breath.  History of smoking.  Denies any recent sick contacts or fevers.  No known COVID exposures.  Denies headache or neck pain.  Past Medical History:  Diagnosis Date  . Acid reflux   . Head injury   . Hepatitis C   . Hiatal hernia     Patient Active Problem List   Diagnosis Date Noted  . Discitis of lumbosacral region 06/28/2018  . Psoas abscess, right ([place]) 06/28/2018  . Multiple abscesses of right shoulder 06/28/2018  . Septic arthritis of multiple joints ([place]) 06/28/2018  . Chronic infection of left knee ([place]) 06/28/2018  . Polysubstance abuse ([place]) 06/24/2018  . Normocytic anemia 06/24/2018  . Effusion of left elbow 06/24/2018  . Recurrent boils 06/24/2018  . Head injury   . Septic arthritis of knee, left ([place])   . Acute medial meniscal tear, left, initial encounter   . GERD (gastroesophageal reflux disease) 06/22/2018  . Mild protein-calorie malnutrition ([place]) 06/22/2018  . AKI (acute kidney injury) ([place]) 06/21/2018  . Spinal stenosis of lumbosacral region 06/21/2018  .  Intractable back pain 06/19/2018  . Hepatitis C antibody test positive 12/17/2013  . Cigarette smoker 12/17/2013  . Family history of diabetes mellitus (DM) 12/17/2013  . Notalgia 12/17/2013    Past Surgical History:  Procedure Laterality Date  . ACROMIO-CLAVICULAR JOINT REPAIR Right 06/28/2018   Procedure: ACROMIO-CLAVICULAR JOINT IRRIGATION AND DEBRIDEMENT;  Surgeon: [name], MD;  Location: [place];  Service: Orthopedics;  Laterality: Right;  . FACIAL FRACTURE SURGERY    . IRRIGATION AND DEBRIDEMENT SHOULDER Right 06/28/2018   Procedure: IRRIGATION AND DEBRIDEMENT SHOULDER;  Surgeon: [name], MD;  Location: [place];  Service: Orthopedics;  Laterality: Right;  . KNEE ARTHROSCOPY Left 06/23/2018   Procedure: LEFT KNEE ARTHROSCOPY KNEE I&D.;  Surgeon: [name], MD;  Location: [place];  Service: Orthopedics;  Laterality: Left;        Home Medications    Prior to Admission medications   Medication Sig Start Date End Date Taking? Authorizing Provider  cyclobenzaprine (FLEXERIL) 10 MG tablet Take 1 tablet (10 mg total) by mouth 3 (three) times daily as needed for muscle spasms. 08/28/18   [name], MD  diazepam (VALIUM) 5 MG tablet Take 1 tablet (5 mg total) by mouth every 8 (eight) hours as needed for anxiety (vertigo). 08/10/18   Georgette Shell, MD  doxycycline (VIBRAMYCIN) 50 MG capsule Take 2 capsules (100 mg total) by mouth 2 (two) times daily. 08/10/18  Alwyn RenMathews, Elizabeth G, MD  ferrous sulfate 325 (65 FE) MG tablet Take 1 tablet (325 mg total) by mouth 2 (two) times daily with a meal. 08/10/18   Alwyn RenMathews, Elizabeth G, MD  oxyCODONE-acetaminophen (PERCOCET/ROXICET) 5-325 MG tablet Take 1 tablet by mouth every 6 (six) hours as needed for severe pain. 08/28/18   Magnant, Charles L, PA-C  vitamin B-12 100 MCG tablet Take 1 tablet (100 mcg total) by mouth daily. 08/10/18   Alwyn RenMathews, Elizabeth G, MD    Family History Family History  Problem Relation Age  of Onset  . Diabetes Mother   . Hypertension Mother   . Hypertension Father     Social History Social History   Tobacco Use  . Smoking status: Current Every Day Smoker    Packs/day: 1.00    Years: 20.00    Pack years: 20.00    Types: Cigarettes  . Smokeless tobacco: Never Used  Substance Use Topics  . Alcohol use: Not Currently  . Drug use: Yes    Types: Cocaine    Comment: admits to last use 2-3 weeks ago     Allergies   Patient has no known allergies.   Review of Systems Review of Systems  Constitutional: Negative for fever.  Respiratory: Positive for shortness of breath. Negative for cough.   Cardiovascular: Negative for chest pain.  Gastrointestinal: Negative for abdominal pain, nausea and vomiting.  Genitourinary: Negative for dysuria.  Musculoskeletal: Negative for back pain.  Skin: Negative for wound.  Neurological: Positive for syncope. Negative for weakness and headaches.  All other systems reviewed and are negative.    Physical Exam Updated Vital Signs BP 136/88   Pulse 89   Temp 98.4 F (36.9 C) (Rectal)   Resp 14   Ht 1.778 m (5\' 10" )   Wt 72.6 kg   SpO2 98%   BMI 22.96 kg/m   Physical Exam Vitals signs and nursing note reviewed.  Constitutional:      Appearance: He is well-developed.     Comments: Chronically ill-appearing but nontoxic  HENT:     Head: Normocephalic and atraumatic.     Mouth/Throat:     Mouth: Mucous membranes are moist.  Eyes:     Pupils: Pupils are equal, round, and reactive to light.  Neck:     Musculoskeletal: Neck supple.  Cardiovascular:     Rate and Rhythm: Regular rhythm. Tachycardia present.     Heart sounds: Normal heart sounds. No murmur.  Pulmonary:     Effort: Pulmonary effort is normal. No respiratory distress.     Breath sounds: Wheezing present.     Comments: Diminished breath sounds left lower lobe Abdominal:     General: Bowel sounds are normal.     Palpations: Abdomen is soft.      Tenderness: There is no abdominal tenderness. There is no rebound.  Musculoskeletal: Normal range of motion.        General: No swelling, tenderness or deformity.  Skin:    General: Skin is warm and dry.  Neurological:     Mental Status: He is alert and oriented to person, place, and time.     Comments: Cranial nerves II through XII intact, 5 out of 5 strength in all 4 extremities, no dysmetria to finger-nose-finger  Psychiatric:        Mood and Affect: Mood normal.      ED Treatments / Results  Labs (all labs ordered are listed, but only abnormal results are displayed) Labs Reviewed  CBC WITH DIFFERENTIAL/PLATELET - Abnormal; Notable for the following components:      Result Value   WBC 12.5 (*)    RBC 4.20 (*)    Hemoglobin 10.0 (*)    HCT 34.2 (*)    MCH 23.8 (*)    MCHC 29.2 (*)    RDW 19.9 (*)    Platelets 433 (*)    Neutro Abs 9.8 (*)    Abs Immature Granulocytes 0.10 (*)    All other components within normal limits  BASIC METABOLIC PANEL - Abnormal; Notable for the following components:   Sodium 134 (*)    Calcium 8.8 (*)    All other components within normal limits  D-DIMER, QUANTITATIVE (NOT AT [facility]) - Abnormal; Notable for the following components:   D-Dimer, Quant 3.68 (*)    All other components within normal limits  SARS CORONAVIRUS 2 (HOSPITAL ORDER, PERFORMED IN [facility])  CULTURE, BLOOD (ROUTINE X 2)  CULTURE, BLOOD (ROUTINE X 2)  TROPONIN I (HIGH SENSITIVITY)  TROPONIN I (HIGH SENSITIVITY)    EKG EKG Interpretation  Date/Time:  Tuesday September 05 2018 01:56:40 EDT Ventricular Rate:  99 PR Interval:    QRS Duration: 92 QT Interval:  346 QTC Calculation: 444 R Axis:   38 Text Interpretation:  Sinus rhythm Confirmed by Ross MarcusHorton, Bernis Schreur ([id number]) on 09/05/2018 3:12:56 AM   Radiology Ct Angio Chest Pe W And/or Wo Contrast  Result Date: 09/05/2018 CLINICAL DATA:  Intermediate probability for pulmonary embolism EXAM: CT ANGIOGRAPHY  CHEST WITH CONTRAST TECHNIQUE: Multidetector CT imaging of the chest was performed using the standard protocol during bolus administration of intravenous contrast. Multiplanar CT image reconstructions and MIPs were obtained to evaluate the vascular anatomy. CONTRAST:  100mL OMNIPAQUE IOHEXOL 350 MG/ML SOLN COMPARISON:  08/01/2018 FINDINGS: Cardiovascular: Normal heart size. Negative for pulmonary artery filling defect. Negative aorta. No pericardial effusion Mediastinum/Nodes: Negative for adenopathy. Lungs/Pleura: Large left pleural effusion with multi segment atelectasis in the lower lobe. There is no edema, consolidation, or pneumothorax. Lobulated 12 x 6 mm nodule in the right upper lobe Upper Abdomen: Negative Musculoskeletal: Soft tissue thickening around the left sternoclavicular joint which shows periapical erosions. There is history of recent septic arthritis but this appearance is similar to prior CT. Review of the MIP images confirms the above findings. IMPRESSION: 1. Negative for pulmonary embolism. 2. Large left pleural effusion with atelectasis, improved from 08/01/2018 3. Left sternoclavicular arthritis with swelling and some erosions. This finding was also seen on CT last month but need correlation for focal tenderness. 4. 12 x 6 mm suspicious right upper lobe nodule. Consider one of the following in 3 months for both low-risk and high-risk individuals: (a) repeat chest CT, (b) follow-up PET-CT, or (c) tissue sampling. This recommendation follows the consensus statement: Guidelines for Management of Incidental Pulmonary Nodules Detected on CT Images: From the Fleischner Society 2017; Radiology 2017; 284:228-243. Electronically Signed   By: Marnee SpringJonathon  Watts M.D.   On: 09/05/2018 05:45   Dg Chest Portable 1 View  Result Date: 09/05/2018 CLINICAL DATA:  Shortness of breath and syncope EXAM: PORTABLE CHEST 1 VIEW COMPARISON:  CT 08/01/2018, radiograph 07/30/2018 FINDINGS: Moderate left pleural  effusion. Right lung is clear. Consolidation at the lingula and left base. Stable cardiomediastinal silhouette. No pneumothorax. IMPRESSION: Moderate left pleural effusion with atelectasis or pneumonia at the lingula and left base Electronically Signed   By: Jasmine PangKim  Fujinaga M.D.   On: 09/05/2018 02:34    Procedures Procedures (including critical  care time)  Medications Ordered in ED Medications  sodium chloride 0.9 % bolus 1,000 mL (0 mLs Intravenous Stopped 09/05/18 0457)  iohexol (OMNIPAQUE) 350 MG/ML injection 100 mL (100 mLs Intravenous Contrast Given 09/05/18 0516)     Initial Impression / Assessment and Plan / ED Course  I have reviewed the triage vital signs and the nursing notes.  Pertinent labs & imaging results that were available during my care of the patient were reviewed by me and considered in my medical decision making (see chart for details).        Patient presents with reported 3 episodes of syncope since dinnertime.  He is overall nontoxic-appearing.  Initially tachycardic but otherwise his vital signs are reassuring.  Rectal temperature is normal.  Recent significant hospitalization with septic arthritis in multiple joints.  He is chronically ill-appearing but nontoxic.  Work-up initiated.  EKG shows no evidence of arrhythmia.  No evidence of ischemia.  Reports shortness of breath.  He has diminished breath sounds left lower lobe.  He has a left pleural effusion and possible pneumonia.  He denies any fevers or cough.  His white count is stable from prior.  D-dimer was sent and is positive.  CT scan obtained.  It again shows the left-sided pleural effusion which is overall improved from his prior CT scan.  It also shows a pulmonary nodule and changes in the right shoulder which were present on prior CT.  Troponin x2 is negative.  Doubt ACS.  Patient is unable to participate in orthostatic testing as he is wheelchair-bound at home.  He states he feels much improved after fluids.   Heart rate now in the 80s.  Suspect dehydration may have been contributory to his episodes of loss of consciousness.  He has had no arrhythmias on the monitor for greater than 4 hours.  And overall his work-up is largely reassuring and at his baseline.  We will plan for discharge home with close primary care follow-up.  Patient informed of pulmonary nodule and will need repeat imaging in 3 to 6 months.  After history, exam, and medical workup I feel the patient has been appropriately medically screened and is safe for discharge home. Pertinent diagnoses were discussed with the patient. Patient was given return precautions.   Final Clinical Impressions(s) / ED Diagnoses   Final diagnoses:  Dehydration  Syncope, unspecified syncope type  Pulmonary nodule    ED Discharge Orders    None       Shon BatonHorton, Blake Vetrano F, MD 09/05/18 [phone]

## 2018-09-05 NOTE — ED Triage Notes (Addendum)
Pt arrived via EMS c/o of "passing out over and over again since 7 o'clock this afternoon". Pt says that he recently had surgery on his right shoulder and left knee for septic arthritis.

## 2018-09-05 NOTE — ED Notes (Signed)
Pt states he is unable to stand long enough to do orthostatic VS

## 2018-09-05 NOTE — Discharge Instructions (Addendum)
You were seen today for possible episodes of loss of consciousness.  Your work-up today is reassuring.  You may have been dehydrated.  Make sure that you are eating and drinking well.  He have a persistent pleural effusion which is improved from your prior CT scan.  You also have a lung nodule which needs follow-up in 3 to 6 months with reimaging.  Follow-up closely with your primary physician.

## 2018-09-07 ENCOUNTER — Ambulatory Visit (INDEPENDENT_AMBULATORY_CARE_PROVIDER_SITE_OTHER): Payer: Self-pay | Admitting: Internal Medicine

## 2018-09-07 ENCOUNTER — Other Ambulatory Visit: Payer: Self-pay

## 2018-09-07 DIAGNOSIS — M009 Pyogenic arthritis, unspecified: Secondary | ICD-10-CM | POA: Insufficient documentation

## 2018-09-07 DIAGNOSIS — M4658 Other infective spondylopathies, sacral and sacrococcygeal region: Secondary | ICD-10-CM

## 2018-09-07 DIAGNOSIS — R197 Diarrhea, unspecified: Secondary | ICD-10-CM | POA: Insufficient documentation

## 2018-09-07 DIAGNOSIS — M25512 Pain in left shoulder: Secondary | ICD-10-CM | POA: Insufficient documentation

## 2018-09-07 DIAGNOSIS — B182 Chronic viral hepatitis C: Secondary | ICD-10-CM

## 2018-09-07 MED ORDER — DOXYCYCLINE HYCLATE 50 MG PO CAPS: 100.0000 mg | ORAL_CAPSULE | Freq: Two times a day (BID) | ORAL | 1 refills | Status: DC

## 2018-09-07 NOTE — Assessment & Plan Note (Signed)
He will need evaluation and treatment for chronic hepatitis C but he is not prepared to address that now.

## 2018-09-07 NOTE — Assessment & Plan Note (Signed)
He has smoldering, widely disseminated infection with multiple infected joints.  His MRI done earlier this month showed persistent right SI joint infection.  His left knee remains warm and swollen and he has been swelling over his left sternoclavicular joint.  Although he has been on a very long course of antibiotics and now has diarrhea I think the lesser evil is to continue doxycycline for now.  Follow-up here in 2 weeks.  He has increased swelling over the sternoclavicular joint with a long need to pain further imaging.

## 2018-09-07 NOTE — Progress Notes (Signed)
[facility]  Patient Active Problem List   Diagnosis Date Noted  . Septic arthritis of shoulder, right (HCC) 09/07/2018    Priority: High  . Septic arthritis of sacroiliac joint (HCC) 09/07/2018    Priority: High  . Sternoclavicular joint pain, left 09/07/2018    Priority: High  . Discitis of lumbosacral region 06/28/2018    Priority: High  . Psoas abscess, right (HCC) 06/28/2018    Priority: High  . Septic arthritis of knee, left (HCC)     Priority: High  . Diarrhea 09/07/2018    Priority: Medium  . Chronic hepatitis C (HCC) 12/17/2013    Priority: Medium  . Polysubstance abuse (HCC) 06/24/2018  . Normocytic anemia 06/24/2018  . Recurrent boils 06/24/2018  . Head injury   . Acute medial meniscal tear, left, initial encounter   . GERD (gastroesophageal reflux disease) 06/22/2018  . Mild protein-calorie malnutrition (HCC) 06/22/2018  . AKI (acute kidney injury) (HCC) 06/21/2018  . Spinal stenosis of lumbosacral region 06/21/2018  . Cigarette smoker 12/17/2013  . Family history of diabetes mellitus (DM) 12/17/2013  . Notalgia 12/17/2013    Patient's Medications  New Prescriptions   No medications on file  Previous Medications   CYCLOBENZAPRINE (FLEXERIL) 10 MG TABLET    Take 1 tablet (10 mg total) by mouth 3 (three) times daily as needed for muscle spasms.   DIAZEPAM (VALIUM) 5 MG TABLET    Take 1 tablet (5 mg total) by mouth every 8 (eight) hours as needed for anxiety (vertigo).   FERROUS SULFATE 325 (65 FE) MG TABLET    Take 1 tablet (325 mg total) by mouth 2 (two) times daily with a meal.   OXYCODONE-ACETAMINOPHEN (PERCOCET/ROXICET) 5-325 MG TABLET    Take 1 tablet by mouth every 6 (six) hours as needed for severe pain.   VITAMIN B-12 100 MCG TABLET    Take 1 tablet (100 mcg total) by mouth daily.  Modified Medications   Modified Medication Previous Medication   DOXYCYCLINE (VIBRAMYCIN) 50 MG CAPSULE doxycycline (VIBRAMYCIN) 50 MG  capsule      Take 2 capsules (100 mg total) by mouth 2 (two) times daily.    Take 2 capsules (100 mg total) by mouth 2 (two) times daily.  Discontinued Medications   No medications on file    Subjective: [name] is in for his hospital follow-up visit.  He was recently hospitalized from 06/19/2018-08/10/2018.  He was found to have widely disseminated septic arthritis.  He had involvement of his left knee, right shoulder, lumbosacral spine and right sacroiliac joint.  Gram stain from his left knee synovial fluid showed gram-positive cocci but all cultures were negative.  He received 6 weeks of IV vancomycin before transitioning to oral doxycycline on discharge.  He took his last doxycycline this morning.  He is still having quite a bit of pain in his back.  His left knee remains swollen and warm.  His right shoulder is much better.  2 days ago he noted swelling and tenderness over his left sternoclavicular joint.  He has not had any fever, chills or sweats.  Last week he began to have diarrhea every time that he had a meal.  He has not had any nausea, vomiting or abdominal pain.  He developed some syncopal episodes and was seen in the emergency department 2 days ago.  He also noted some shortness of breath and cough.  Chest x-ray revealed a previously  seen left pleural effusion.  He was afebrile and testing for COVID-19 was negative.   He has a history of polysubstance abuse.  He has chronic hepatitis C with a recent viral load of 17,600.  Review of Systems: Review of Systems  Constitutional: Positive for malaise/fatigue and weight loss. Negative for chills, diaphoresis and fever.  HENT: Negative for congestion and sore throat.   Respiratory: Positive for shortness of breath. Negative for cough and sputum production.   Cardiovascular: Negative for chest pain.  Gastrointestinal: Positive for diarrhea. Negative for abdominal pain, nausea and vomiting.  Genitourinary: Negative for dysuria.   Musculoskeletal: Positive for back pain and joint pain.  Neurological: Negative for focal weakness.    Past Medical History:  Diagnosis Date  . Acid reflux   . Head injury   . Hepatitis C   . Hiatal hernia     Social History   Tobacco Use  . Smoking status: Current Every Day Smoker    Packs/day: 1.00    Years: 20.00    Pack years: 20.00    Types: Cigarettes  . Smokeless tobacco: Never Used  Substance Use Topics  . Alcohol use: Not Currently  . Drug use: Yes    Types: Cocaine    Comment: admits to last use 2-3 weeks ago    Family History  Problem Relation Age of Onset  . Diabetes Mother   . Hypertension Mother   . Hypertension Father     No Known Allergies  Objective: There were no vitals filed for this visit. There is no height or weight on file to calculate BMI.  Physical Exam Constitutional:      Comments: He appears mildly uncomfortable and anxious.  He is very talkative.  Cardiovascular:     Rate and Rhythm: Regular rhythm.     Heart sounds: No murmur.     Comments: He is tachycardic. Pulmonary:     Effort: Pulmonary effort is normal.     Breath sounds: No wheezing or rales.     Comments: He has diminished breath sounds and some egophony in the left base posteriorly. Abdominal:     Palpations: Abdomen is soft.     Tenderness: There is no abdominal tenderness.  Musculoskeletal:        General: Swelling and tenderness present.     Comments: He has some swelling and warmth of his left knee with restricted range of motion.  Unusual swelling or redness of his right shoulder.  He has some swelling mild tenderness to palpation over his left sternoclavicular joint.  There is no redness or fluctuance.  He walks with the assistance of a walker.  Skin:    Findings: No rash.     Lab Results Sed Rate (mm/hr)  Date Value  08/01/2018 >140 (H)  06/20/2018 80 (H)   CRP (mg/dL)  Date Value  40/98/119106/23/2020 24.0 (H)  06/20/2018 24.1 (H)      Problem List  Items Addressed This Visit      High   Sternoclavicular joint pain, left   Septic arthritis of shoulder, right (HCC)   Septic arthritis of sacroiliac joint (HCC)   Septic arthritis of knee, left (HCC)    He has smoldering, widely disseminated infection with multiple infected joints.  His MRI done earlier this month showed persistent right SI joint infection.  His left knee remains warm and swollen and he has been swelling over his left sternoclavicular joint.  Although he has been on a very long course of  antibiotics and now has diarrhea I think the lesser evil is to continue doxycycline for now.  Follow-up here in 2 weeks.  He has increased swelling over the sternoclavicular joint with a long need to pain further imaging.      Relevant Orders   CBC   Comprehensive metabolic panel   C-reactive protein   Sedimentation rate     Medium   Diarrhea    I have ordered testing for C. difficile.  He does not think that he can give a specimen here today.  We have sent him home with a specimen cup.  I asked him to bring a sample to his initial visit with his new PCP tomorrow morning.      Relevant Orders   Clostridium Difficile by PCR   Chronic hepatitis C ([place])    He will need evaluation and treatment for chronic hepatitis C but he is not prepared to address that now.          Michel Bickers, MD [facility] [place] [facility] [phone] pager   [phone] cell 09/07/2018, 3:08 PM

## 2018-09-07 NOTE — Assessment & Plan Note (Signed)
I have ordered testing for C. difficile.  He does not think that he can give a specimen here today.  We have sent him home with a specimen cup.  I asked him to bring a sample to his initial visit with his new PCP tomorrow morning.

## 2018-09-08 ENCOUNTER — Ambulatory Visit: Payer: Self-pay | Attending: Family Medicine | Admitting: Family Medicine

## 2018-09-08 ENCOUNTER — Encounter: Payer: Self-pay | Admitting: Family Medicine

## 2018-09-08 VITALS — BP 137/87 | HR 117 | Temp 98.5°F | Wt 141.4 lb

## 2018-09-08 DIAGNOSIS — G4709 Other insomnia: Secondary | ICD-10-CM

## 2018-09-08 DIAGNOSIS — M009 Pyogenic arthritis, unspecified: Secondary | ICD-10-CM

## 2018-09-08 DIAGNOSIS — M4658 Other infective spondylopathies, sacral and sacrococcygeal region: Secondary | ICD-10-CM

## 2018-09-08 DIAGNOSIS — R911 Solitary pulmonary nodule: Secondary | ICD-10-CM

## 2018-09-08 DIAGNOSIS — F419 Anxiety disorder, unspecified: Secondary | ICD-10-CM

## 2018-09-08 DIAGNOSIS — M4807 Spinal stenosis, lumbosacral region: Secondary | ICD-10-CM

## 2018-09-08 LAB — CBC
HCT: 38.4 % — ABNORMAL LOW (ref 38.5–50.0)
Hemoglobin: 11.9 g/dL — ABNORMAL LOW (ref 13.2–17.1)
MCH: 24.5 pg — ABNORMAL LOW (ref 27.0–33.0)
MCHC: 31 g/dL — ABNORMAL LOW (ref 32.0–36.0)
MCV: 79 fL — ABNORMAL LOW (ref 80.0–100.0)
MPV: 9.2 fL (ref 7.5–12.5)
Platelets: 484 10*3/uL — ABNORMAL HIGH (ref 140–400)
RBC: 4.86 10*6/uL (ref 4.20–5.80)
RDW: 19.1 % — ABNORMAL HIGH (ref 11.0–15.0)
WBC: 12.2 10*3/uL — ABNORMAL HIGH (ref 3.8–10.8)

## 2018-09-08 LAB — C-REACTIVE PROTEIN: CRP: 16 mg/L — ABNORMAL HIGH (ref <8.0)

## 2018-09-08 LAB — COMPREHENSIVE METABOLIC PANEL
AG Ratio: 0.9 (calc) — ABNORMAL LOW (ref 1.0–2.5)
ALT: 34 U/L (ref 9–46)
AST: 19 U/L (ref 10–35)
Albumin: 3.7 g/dL (ref 3.6–5.1)
Alkaline phosphatase (APISO): 131 U/L (ref 35–144)
BUN: 17 mg/dL (ref 7–25)
CO2: 23 mmol/L (ref 20–32)
Calcium: 10.2 mg/dL (ref 8.6–10.3)
Chloride: 104 mmol/L (ref 98–110)
Creat: 0.94 mg/dL (ref 0.70–1.33)
Globulin: 4.2 g/dL (calc) — ABNORMAL HIGH (ref 1.9–3.7)
Glucose, Bld: 97 mg/dL (ref 65–99)
Potassium: 4.6 mmol/L (ref 3.5–5.3)
Sodium: 138 mmol/L (ref 135–146)
Total Bilirubin: 0.3 mg/dL (ref 0.2–1.2)
Total Protein: 7.9 g/dL (ref 6.1–8.1)

## 2018-09-08 LAB — SEDIMENTATION RATE: Sed Rate: 65 mm/h — ABNORMAL HIGH (ref 0–20)

## 2018-09-08 MED ORDER — MIRTAZAPINE 15 MG PO TABS: 15.0000 mg | ORAL_TABLET | Freq: Every day | ORAL | 1 refills | Status: DC

## 2018-09-08 MED ORDER — HYDROXYZINE HCL 25 MG PO TABS: 25.0000 mg | ORAL_TABLET | Freq: Three times a day (TID) | ORAL | 1 refills | Status: DC | PRN

## 2018-09-08 MED ORDER — GABAPENTIN 300 MG PO CAPS: 600.0000 mg | ORAL_CAPSULE | Freq: Two times a day (BID) | ORAL | 1 refills | Status: DC

## 2018-09-08 MED ORDER — CYCLOBENZAPRINE HCL 10 MG PO TABS: 10.0000 mg | ORAL_TABLET | Freq: Three times a day (TID) | ORAL | 0 refills | Status: DC | PRN

## 2018-09-08 NOTE — Progress Notes (Signed)
Subjective:  Patient ID: [name], male    DOB: 12/07/1965  Age: 53 y.o. MRN: [id number]  CC: New Patient (Initial Visit)   HPI [name] is a 53 year old male initially scheduled to see Dr. Jillyn HiddenFulp today with a previous history of IV drug abuse, disseminated septic arthritis (with involvement of his right shoulder, left knee, lumbosacral spine and sacroiliac joint), chronic hepatitis C who presents to establish care. He underwent incision and drainage of right shoulder and left knee in 06/2018 and was last seen by his orthopedic Dr. August Saucerean on 08/28/2018 and infectious disease [name] yesterday. He is currently on doxycycline but complains he is out of his pain medications and is unable to sleep, complains of his feet stinging at night, he looks in the mirror and states he has lost about 31 pounds and this makes him anxious.  He has pain in his tailbone and significantly deduced range of motion of his right shoulder. He informs me his last use of cocaine was 1 month ago.  3 days ago he had an ED visit after he had a couple of syncopal episodes and was diagnosed with dehydration.  CT angiogram of the chest was negative for PE but revealed 12 x 6 mm suspicious right upper lobe nodule.  Past Medical History:  Diagnosis Date  . Acid reflux   . Head injury   . Hepatitis C   . Hiatal hernia     Past Surgical History:  Procedure Laterality Date  . ACROMIO-CLAVICULAR JOINT REPAIR Right 06/28/2018   Procedure: ACROMIO-CLAVICULAR JOINT IRRIGATION AND DEBRIDEMENT;  Surgeon: Cammy Copaean, Gregory Scott, MD;  [facility];  Service: Orthopedics;  Laterality: Right;  . FACIAL FRACTURE SURGERY    . IRRIGATION AND DEBRIDEMENT SHOULDER Right 06/28/2018   Procedure: IRRIGATION AND DEBRIDEMENT SHOULDER;  Surgeon: Cammy Copaean, Gregory Scott, MD;  [facility];  Service: Orthopedics;  Laterality: Right;  . KNEE ARTHROSCOPY Left 06/23/2018   Procedure: LEFT KNEE ARTHROSCOPY KNEE I&D.;  Surgeon: Cammy Copaean, Gregory  Scott, MD;  [facility];  Service: Orthopedics;  Laterality: Left;    Family History  Problem Relation Age of Onset  . Diabetes Mother   . Hypertension Mother   . Hypertension Father     No Known Allergies  Outpatient Medications Prior to Visit  Medication Sig Dispense Refill  . diazepam (VALIUM) 5 MG tablet Take 1 tablet (5 mg total) by mouth every 8 (eight) hours as needed for anxiety (vertigo). 30 tablet 0  . doxycycline (VIBRAMYCIN) 50 MG capsule Take 2 capsules (100 mg total) by mouth 2 (two) times daily. 60 capsule 1  . ferrous sulfate 325 (65 FE) MG tablet Take 1 tablet (325 mg total) by mouth 2 (two) times daily with a meal.  3  . oxyCODONE-acetaminophen (PERCOCET/ROXICET) 5-325 MG tablet Take 1 tablet by mouth every 6 (six) hours as needed for severe pain. 20 tablet 0  . vitamin B-12 100 MCG tablet Take 1 tablet (100 mcg total) by mouth daily.    . cyclobenzaprine (FLEXERIL) 10 MG tablet Take 1 tablet (10 mg total) by mouth 3 (three) times daily as needed for muscle spasms. 20 tablet 0   No facility-administered medications prior to visit.      ROS Review of Systems  Constitutional: Negative for activity change and appetite change.  HENT: Negative for sinus pressure and sore throat.   Eyes: Negative for visual disturbance.  Respiratory: Negative for cough, chest tightness and shortness of breath.   Cardiovascular: Negative  for chest pain and leg swelling.  Gastrointestinal: Negative for abdominal distention, abdominal pain, constipation and diarrhea.  Endocrine: Negative.   Genitourinary: Negative for dysuria.  Musculoskeletal:       See HPI  Skin: Negative for rash.  Allergic/Immunologic: Negative.   Neurological: Negative for weakness, light-headedness and numbness.  Psychiatric/Behavioral: Positive for dysphoric mood. Negative for suicidal ideas.    Objective:  BP 137/87   Pulse (!) 117   Temp 98.5 F (36.9 C) (Oral)   Wt 141 lb 6.4 oz (64.1 kg)   SpO2  97%   BMI 20.29 kg/m   BP/Weight 09/08/2018 09/05/2018 08/10/2018  Systolic BP 137 119 114  Diastolic BP 87 76 62  Wt. (Lbs) 141.4 160 -  BMI 20.29 22.96 -      Physical Exam Constitutional:      Appearance: He is well-developed. He is ill-appearing.  Cardiovascular:     Rate and Rhythm: Normal rate.     Heart sounds: Normal heart sounds. No murmur.  Pulmonary:     Effort: Pulmonary effort is normal.     Breath sounds: Normal breath sounds. No wheezing or rales.  Chest:     Chest wall: No tenderness.  Abdominal:     General: Bowel sounds are normal. There is no distension.     Palpations: Abdomen is soft. There is no mass.     Tenderness: There is no abdominal tenderness.  Musculoskeletal:     Comments: Severely restricted range of motion of right upper extremity Tenderness palpation of medial aspect of right scapula Tenderness to palpation across lower back Left knee edema with tenderness on flexion, extension  Neurological:     Mental Status: He is alert and oriented to person, place, and time.  Psychiatric:     Comments: Dysphoric mood     CMP Latest Ref Rng & Units 09/07/2018 09/05/2018 08/08/2018  Glucose 65 - 99 mg/dL 97 99 161(W106(H)  BUN 7 - 25 mg/dL 17 19 13   Creatinine 0.70 - 1.33 mg/dL 9.600.94 4.540.73 0.980.71  Sodium 135 - 146 mmol/L 138 134(L) 137  Potassium 3.5 - 5.3 mmol/L 4.6 3.5 4.0  Chloride 98 - 110 mmol/L 104 103 102  CO2 20 - 32 mmol/L 23 24 27   Calcium 8.6 - 10.3 mg/dL 11.910.2 1.4(N8.8(L) 8.2(N8.7(L)  Total Protein 6.1 - 8.1 g/dL 7.9 - 6.2(L)  Total Bilirubin 0.2 - 1.2 mg/dL 0.3 - 0.4  Alkaline Phos 38 - 126 U/L - - 122  AST 10 - 35 U/L 19 - 18  ALT 9 - 46 U/L 34 - 40    Lipid Panel     Component Value Date/Time   CHOL 191 12/17/2013 1004   TRIG 145 12/17/2013 1004   HDL 53 12/17/2013 1004   CHOLHDL 3.6 12/17/2013 1004   VLDL 29 12/17/2013 1004   LDLCALC 109 (H) 12/17/2013 1004    CBC    Component Value Date/Time   WBC 12.2 (H) 09/07/2018 1525   RBC 4.86  09/07/2018 1525   HGB 11.9 (L) 09/07/2018 1525   HCT 38.4 (L) 09/07/2018 1525   PLT 484 (H) 09/07/2018 1525   MCV 79.0 (L) 09/07/2018 1525   MCH 24.5 (L) 09/07/2018 1525   MCHC 31.0 (L) 09/07/2018 1525   RDW 19.1 (H) 09/07/2018 1525   LYMPHSABS 2.0 09/05/2018 0257   MONOABS 0.5 09/05/2018 0257   EOSABS 0.1 09/05/2018 0257   BASOSABS 0.1 09/05/2018 0257    Lab Results  Component Value Date   HGBA1C  5.7 (H) 12/17/2013    Assessment & Plan:   1. Pyogenic arthritis of right shoulder region, due to unspecified organism [facility]) Currently on doxycycline Followed by infectious disease  2. Other insomnia Secondary to pain We will treat underlying pain and commenced Remeron which will help with insomnia and weight loss - mirtazapine (REMERON) 15 MG tablet; Take 1 tablet (15 mg total) by mouth at bedtime.  Dispense: 30 tablet; Refill: 1  3. Anxiety Secondary to underlying medical conditions - hydrOXYzine (ATARAX/VISTARIL) 25 MG tablet; Take 1 tablet (25 mg total) by mouth 3 (three) times daily as needed.  Dispense: 60 tablet; Refill: 1  4. Septic arthritis of sacroiliac joint (HCC) We will hold off on narcotic given previous IVDU - gabapentin (NEURONTIN) 300 MG capsule; Take 2 capsules (600 mg total) by mouth 2 (two) times daily.  Dispense: 60 capsule; Refill: 1 - cyclobenzaprine (FLEXERIL) 10 MG tablet; Take 1 tablet (10 mg total) by mouth 3 (three) times daily as needed for muscle spasms.  Dispense: 90 tablet; Refill: 0  5. Spinal stenosis of lumbosacral region - Drug Screen 12+Alcohol+CRT, Ur  6. Lung nodule Follow-up with PCP for lung nodule -needs repeat imaging in 3 months Meds ordered this encounter  Medications  . gabapentin (NEURONTIN) 300 MG capsule    Sig: Take 2 capsules (600 mg total) by mouth 2 (two) times daily.    Dispense:  60 capsule    Refill:  1  . cyclobenzaprine (FLEXERIL) 10 MG tablet    Sig: Take 1 tablet (10 mg total) by mouth 3 (three) times daily as  needed for muscle spasms.    Dispense:  90 tablet    Refill:  0  . hydrOXYzine (ATARAX/VISTARIL) 25 MG tablet    Sig: Take 1 tablet (25 mg total) by mouth 3 (three) times daily as needed.    Dispense:  60 tablet    Refill:  1  . mirtazapine (REMERON) 15 MG tablet    Sig: Take 1 tablet (15 mg total) by mouth at bedtime.    Dispense:  30 tablet    Refill:  1    Follow-up: Return in about 2 weeks (around 09/22/2018) for Dr Chapman Fitch to establish care.       Charlott Rakes, MD, FAAFP. [facility] and [place], [place]   09/08/2018, 11:31 AM

## 2018-09-08 NOTE — Progress Notes (Signed)
Patient is having pain in lower back and left knee.  Patient needs refills.

## 2018-09-09 LAB — DRUG SCREEN 12+ALCOHOL+CRT, UR
Amphetamines, Urine: NEGATIVE ng/mL
BENZODIAZ UR QL: NEGATIVE ng/mL
Barbiturate: NEGATIVE ng/mL
Cannabinoids: NEGATIVE ng/mL
Cocaine (Metabolite): NEGATIVE ng/mL
Creatinine, Urine: 120.9 mg/dL (ref 20.0–300.0)
Ethanol, Urine: NEGATIVE %
Meperidine: NEGATIVE ng/mL
Methadone: NEGATIVE ng/mL
OPIATE SCREEN URINE: NEGATIVE ng/mL
Oxycodone/Oxymorphone, Urine: NEGATIVE ng/mL
Phencyclidine: NEGATIVE ng/mL
Propoxyphene: NEGATIVE ng/mL
Tramadol: NEGATIVE ng/mL

## 2018-09-10 LAB — CULTURE, BLOOD (ROUTINE X 2)
Culture: NO GROWTH
Culture: NO GROWTH
Special Requests: ADEQUATE
Special Requests: ADEQUATE

## 2018-09-12 ENCOUNTER — Telehealth: Payer: Self-pay | Admitting: General Practice

## 2018-09-12 NOTE — Telephone Encounter (Signed)
Patient called wanting to know if he can get a letter written for the 4 months he has been out of work due to having surgery on his shoulder for his disability. Please follow up.

## 2018-09-12 NOTE — Telephone Encounter (Signed)
Patient will est care on 10/05/2018. Please follow up with letter.

## 2018-09-12 NOTE — Telephone Encounter (Signed)
I have not seen the patient for this issue. Please send to the provider that has been seeing patient for this issue or he may need to get a letter from his surgeon

## 2018-09-13 NOTE — Telephone Encounter (Signed)
Will address once newlin returns on Monday.

## 2018-09-21 ENCOUNTER — Encounter: Payer: Self-pay | Admitting: Internal Medicine

## 2018-09-21 ENCOUNTER — Telehealth: Payer: Self-pay | Admitting: Family Medicine

## 2018-09-21 ENCOUNTER — Other Ambulatory Visit: Payer: Self-pay

## 2018-09-21 ENCOUNTER — Telehealth: Payer: Self-pay | Admitting: Pharmacy Technician

## 2018-09-21 ENCOUNTER — Ambulatory Visit (INDEPENDENT_AMBULATORY_CARE_PROVIDER_SITE_OTHER): Payer: Self-pay | Admitting: Internal Medicine

## 2018-09-21 DIAGNOSIS — B182 Chronic viral hepatitis C: Secondary | ICD-10-CM

## 2018-09-21 DIAGNOSIS — M00062 Staphylococcal arthritis, left knee: Secondary | ICD-10-CM

## 2018-09-21 DIAGNOSIS — R197 Diarrhea, unspecified: Secondary | ICD-10-CM

## 2018-09-21 MED ORDER — DOXYCYCLINE HYCLATE 50 MG PO CAPS: 100.0000 mg | ORAL_CAPSULE | Freq: Two times a day (BID) | ORAL | 1 refills | Status: DC

## 2018-09-21 NOTE — Telephone Encounter (Signed)
[facility] Patient Advocate Encounter    Findings of the benefits investigation conducted this morning via test claims for the patient's upcoming appointment on 09/21/2018 are as follows:   Insurance: currently uninsured  [facility] Patient Advocate had the patient sign both applications for [employer] and [employer]. Will submit once labs return and medication determination.  Patient is currently living in household with his father and previously before hospital admission bringing in $600/$1000 per week. Since discharge, he is unable to work and filing for interim disability until he is able to work again. He can be reached at [phone] for follow-up once medication is approved.   Bartholomew Crews, CPhT [facility] Pharm[facility] Patient [facility] for Infectious Disease Phone: [phone] Fax: [phone] 09/21/2018 4:18 PM

## 2018-09-22 ENCOUNTER — Other Ambulatory Visit: Payer: Self-pay | Admitting: Family Medicine

## 2018-09-22 ENCOUNTER — Telehealth: Payer: Self-pay | Admitting: Family Medicine

## 2018-09-22 DIAGNOSIS — M4658 Other infective spondylopathies, sacral and sacrococcygeal region: Secondary | ICD-10-CM

## 2018-09-22 LAB — PROTIME-INR
INR: 0.9
Prothrombin Time: 9.8 s (ref 9.0–11.5)

## 2018-09-22 MED ORDER — CYCLOBENZAPRINE HCL 10 MG PO TABS: 10.0000 mg | ORAL_TABLET | Freq: Three times a day (TID) | ORAL | 0 refills | Status: DC | PRN

## 2018-09-22 NOTE — Assessment & Plan Note (Signed)
This has resolved spontaneously.

## 2018-09-22 NOTE — Assessment & Plan Note (Signed)
Given the severe and widespread nature of his infection I favor continuing doxycycline for now.  I asked him to call Dr. Alphonzo Severance regarding the protruding stitch.

## 2018-09-22 NOTE — Progress Notes (Signed)
[facility]  Patient Active Problem List   Diagnosis Date Noted  . Septic arthritis of Chan, right (HCC) 09/07/2018    Priority: High  . Septic arthritis of sacroiliac Chan (HCC) 09/07/2018    Priority: High  . Sternoclavicular Chan pain, left 09/07/2018    Priority: High  . Discitis of lumbosacral region 06/28/2018    Priority: High  . Psoas abscess, right (HCC) 06/28/2018    Priority: High  . Septic arthritis of Chan, left (HCC)     Priority: High  . Diarrhea 09/07/2018    Priority: Medium  . Chronic hepatitis C (HCC) 12/17/2013    Priority: Medium  . Polysubstance abuse (HCC) 06/24/2018  . Normocytic anemia 06/24/2018  . Recurrent boils 06/24/2018  . Head injury   . Acute medial meniscal tear, left, initial encounter   . GERD (gastroesophageal reflux disease) 06/22/2018  . Mild protein-calorie malnutrition (HCC) 06/22/2018  . AKI (acute kidney injury) (HCC) 06/21/2018  . Spinal stenosis of lumbosacral region 06/21/2018  . Cigarette smoker 12/17/2013  . Family history of diabetes mellitus (DM) 12/17/2013  . Notalgia 12/17/2013    Patient's Medications  New Prescriptions   No medications on file  Previous Medications   CYCLOBENZAPRINE (FLEXERIL) 10 MG TABLET    Take 1 tablet (10 mg total) by mouth 3 (three) times daily as needed for muscle spasms.   DIAZEPAM (VALIUM) 5 MG TABLET    Take 1 tablet (5 mg total) by mouth every 8 (eight) hours as needed for anxiety (vertigo).   FERROUS SULFATE 325 (65 FE) MG TABLET    Take 1 tablet (325 mg total) by mouth 2 (two) times daily with a meal.   GABAPENTIN (NEURONTIN) 300 MG CAPSULE    Take 2 capsules (600 mg total) by mouth 2 (two) times daily.   HYDROXYZINE (ATARAX/VISTARIL) 25 MG TABLET    Take 1 tablet (25 mg total) by mouth 3 (three) times daily as needed.   MIRTAZAPINE (REMERON) 15 MG TABLET    Take 1 tablet (15 mg total) by mouth at bedtime.   OXYCODONE-ACETAMINOPHEN  (PERCOCET/ROXICET) 5-325 MG TABLET    Take 1 tablet by mouth every 6 (six) hours as needed for severe pain.   VITAMIN B-12 100 MCG TABLET    Take 1 tablet (100 mcg total) by mouth daily.  Modified Medications   Modified Medication Previous Medication   DOXYCYCLINE (VIBRAMYCIN) 50 MG CAPSULE doxycycline (VIBRAMYCIN) 50 MG capsule      Take 2 capsules (100 mg total) by mouth 2 (two) times daily.    Take 2 capsules (100 mg total) by mouth 2 (two) times daily.  Discontinued Medications   No medications on file    Subjective: [name] is in for [name] hospital follow-up visit.  [name] was recently hospitalized from 06/19/2018-08/10/2018.  [name] was found to have widely disseminated septic arthritis.  [name] had involvement of [name], right Chan, lumbosacral spine, right sacroiliac Chan and left sternoclavicular Chan.  Gram stain from [name] synovial fluid showed gram-positive cocci but all cultures were negative.  [name] received 6 weeks of IV vancomycin before transitioning to oral doxycycline on discharge.  [name] took [name] last doxycycline this morning.  [name] is still having quite a bit of pain in [name].  [name] remains swollen and warm.  [name] is much better but still painful.  [name] recently noticed what appears to be a protruding stitch  on [name] right anterior Chan.  [name] is still having right lower Chan pain [name] has not had any fever, chills or sweats.  [name] began having diarrhea after discharge.  [name] ordered C. difficile screening but [name] diarrhea resolved spontaneously before [name] could collect a specimen.  [name] has a history of polysubstance abuse.  [name] says that [name] was using cocaine intermittently (denies any injecting drug use) and drinking alcohol but states that [name] has not had any drugs or alcohol since discharge.  [name] has chronic hepatitis C, genotype 3, with a recent viral load of 17,600.   Review of Systems: Review of Systems  Constitutional: Positive for malaise/fatigue and weight loss.  Negative for chills, diaphoresis and fever.  HENT: Negative for congestion and sore throat.   Respiratory: Negative for cough, sputum production and shortness of breath.   Cardiovascular: Negative for Chan pain.  Gastrointestinal: Negative for abdominal pain, diarrhea, nausea and vomiting.  Genitourinary: Negative for dysuria.  Musculoskeletal: Positive for Chan pain and Chan pain.  Neurological: Negative for focal weakness.    Past Medical History:  Diagnosis Date  . Acid reflux   . Head injury   . Hepatitis C   . Hiatal hernia     Social History   Tobacco Use  . Smoking status: Current Every Day Smoker    Packs/day: 1.00    Years: 20.00    Pack years: 20.00    Types: Cigarettes  . Smokeless tobacco: Never Used  Substance Use Topics  . Alcohol use: Not Currently  . Drug use: Yes    Types: Cocaine    Comment: admits to last use 2-3 weeks ago    Family History  Problem Relation Age of Onset  . Diabetes Mother   . Hypertension Mother   . Hypertension Father     No Known Allergies  Objective: Vitals:   09/21/18 1508  BP: 128/88  Pulse: (!) 129  Temp: 98 F (36.7 C)   There is no height or weight on file to calculate BMI.  Physical Exam Constitutional:      Comments: [name] appears to be feeling a little bit better.  [name] says that [name] is now aware of how sick [name] was.  [name] says that [name] and [name] is very grateful to have survived.  Cardiovascular:     Rate and Rhythm: Normal rate and regular rhythm.     Heart sounds: No murmur.  Pulmonary:     Effort: Pulmonary effort is normal.     Breath sounds: Normal breath sounds. No wheezing or rales.  Abdominal:     Palpations: Abdomen is soft.     Tenderness: There is no abdominal tenderness.  Musculoskeletal:        General: Swelling and tenderness present.     Comments: [name] has some swelling and warmth of [name] with restricted range of motion.  There is no unusual swelling or redness of [name]  right Chan.  There does appear to be a stitch protruding from [name].  [name] has some swelling mild tenderness to palpation over [name].  There is no redness or fluctuance.  [name] is very tender over [name].   Skin:    Findings: No rash.     Lab Results Sed Rate  Date Value  09/07/2018 65 mm/h (H)  08/01/2018 >140 mm/hr (H)  06/20/2018 80 mm/hr (H)   CRP  Date Value  09/07/2018 16.0  mg/L (H)  08/01/2018 24.0 mg/dL (H)  16/10/960405/01/2019 54.024.1 mg/dL (H)      Problem List Items Addressed This Visit      High   Septic arthritis of Chan, left (HCC)    Given the severe and widespread nature of [name] infection [name] favor continuing doxycycline for now.  [name] asked [name] to call Dr. Dorene GrebeScott Dean regarding the protruding stitch.        Medium   Diarrhea    This has resolved spontaneously.      Chronic hepatitis C (HCC)    [name] will check [name] INR and fibrosis score and then we will get [name] Chan to start therapy for chronic hepatitis C.      Relevant Orders   Hepatitis C RNA quantitative   Liver Fibrosis, FibroTest-ActiTest   Protime-INR ( [facility] ONLY) (Completed)       Cliffton AstersJohn Maia Handa, MD [facility] [phone] pager   [phone] cell 09/22/2018, 10:36 AM

## 2018-09-22 NOTE — Telephone Encounter (Signed)
Refills sent

## 2018-09-22 NOTE — Assessment & Plan Note (Signed)
I will check his INR and fibrosis score and then we will get him back to start therapy for chronic hepatitis C.

## 2018-09-22 NOTE — Telephone Encounter (Signed)
1) Medication(s) Requested (by name): cyclobenzaprine (FLEXERIL) 10 MG tablet   2) Pharmacy of Choice: [facility], [place] 3) Special Requests:   Approved medications will be sent to the pharmacy, we will reach out if there is an issue.  Requests made after 3pm may not be addressed until the following business day!  If a patient is unsure of the name of the medication(s) please note and ask patient to call back when they are able to provide all info, do not send to responsible party until all information is available!

## 2018-09-27 ENCOUNTER — Other Ambulatory Visit: Payer: Self-pay

## 2018-09-27 ENCOUNTER — Encounter: Payer: Self-pay | Admitting: Orthopedic Surgery

## 2018-09-27 ENCOUNTER — Ambulatory Visit (INDEPENDENT_AMBULATORY_CARE_PROVIDER_SITE_OTHER): Payer: Self-pay | Admitting: Orthopedic Surgery

## 2018-09-27 DIAGNOSIS — M01X9 Direct infection of multiple joints in infectious and parasitic diseases classified elsewhere: Secondary | ICD-10-CM

## 2018-09-27 DIAGNOSIS — M0089 Polyarthritis due to other bacteria: Secondary | ICD-10-CM

## 2018-09-27 DIAGNOSIS — A498 Other bacterial infections of unspecified site: Secondary | ICD-10-CM

## 2018-09-27 LAB — LIVER FIBROSIS, FIBROTEST-ACTITEST
ALT: 23 U/L (ref 9–46)
Alpha-2-Macroglobulin: 229 mg/dL (ref 106–279)
Apolipoprotein A1: 153 mg/dL (ref 94–176)
Bilirubin: 0.2 mg/dL (ref 0.2–1.2)
Fibrosis Score: 0.12
GGT: 49 U/L (ref 3–95)
Haptoglobin: 338 mg/dL — ABNORMAL HIGH (ref 43–212)
Necroinflammat ACT Score: 0.08
Reference ID: 3046387

## 2018-09-27 LAB — HEPATITIS C RNA QUANTITATIVE
HCV Quantitative Log: 4.81 Log IU/mL — ABNORMAL HIGH
HCV RNA, PCR, QN: 65000 IU/mL — ABNORMAL HIGH

## 2018-09-27 NOTE — Progress Notes (Signed)
Post-Op Visit Note   Patient: [name]           Date of Birth: 08/29/1965           MRN: [id number] Visit Date: 09/27/2018 PCP: Hoy RegisterNewlin, Enobong, MD   Assessment & Plan:  Chief Complaint: No chief complaint on file.  Visit Diagnoses: No diagnosis found.  Plan: [name] is a patient who underwent right shoulder I&D about 3 months ago.  Also AC joint I&D.  The shoulder joint was not actually gone into proper.  He comes in today because he has some type of foreign body at this around the area just below the clavicle incision.  Arthroscopy was not performed in that region.  On exam he is got reasonable shoulder range of motion but still little stiff.  This suture like string is removed.  Is about 7 mm long.  It is dark.  I do not think is related to the surgery.  Nonetheless it is removed in total without any undue or untoward issues and I will see him back as needed.  Follow-Up Instructions: No follow-ups on file.   Orders:  No orders of the defined types were placed in this encounter.  No orders of the defined types were placed in this encounter.   Imaging: No results found.  PMFS History: Patient Active Problem List   Diagnosis Date Noted  . Diarrhea 09/07/2018  . Septic arthritis of shoulder, right (HCC) 09/07/2018  . Septic arthritis of sacroiliac joint (HCC) 09/07/2018  . Sternoclavicular joint pain, left 09/07/2018  . Discitis of lumbosacral region 06/28/2018  . Psoas abscess, right (HCC) 06/28/2018  . Polysubstance abuse (HCC) 06/24/2018  . Normocytic anemia 06/24/2018  . Recurrent boils 06/24/2018  . Head injury   . Septic arthritis of knee, left (HCC)   . Acute medial meniscal tear, left, initial encounter   . GERD (gastroesophageal reflux disease) 06/22/2018  . Mild protein-calorie malnutrition (HCC) 06/22/2018  . AKI (acute kidney injury) (HCC) 06/21/2018  . Spinal stenosis of lumbosacral region 06/21/2018  . Chronic hepatitis C (HCC) 12/17/2013  . Cigarette  smoker 12/17/2013  . Family history of diabetes mellitus (DM) 12/17/2013  . Notalgia 12/17/2013   Past Medical History:  Diagnosis Date  . Acid reflux   . Head injury   . Hepatitis C   . Hiatal hernia     Family History  Problem Relation Age of Onset  . Diabetes Mother   . Hypertension Mother   . Hypertension Father     Past Surgical History:  Procedure Laterality Date  . ACROMIO-CLAVICULAR JOINT REPAIR Right 06/28/2018   Procedure: ACROMIO-CLAVICULAR JOINT IRRIGATION AND DEBRIDEMENT;  Surgeon: Cammy Copaean, Sigfredo Schreier Scott, MD;  [facility];  Service: Orthopedics;  Laterality: Right;  . FACIAL FRACTURE SURGERY    . IRRIGATION AND DEBRIDEMENT SHOULDER Right 06/28/2018   Procedure: IRRIGATION AND DEBRIDEMENT SHOULDER;  Surgeon: Cammy Copaean, Laytoya Ion Scott, MD;  [facility];  Service: Orthopedics;  Laterality: Right;  . KNEE ARTHROSCOPY Left 06/23/2018   Procedure: LEFT KNEE ARTHROSCOPY KNEE I&D.;  Surgeon: Cammy Copaean, Chiquetta Langner Scott, MD;  [facility];  Service: Orthopedics;  Laterality: Left;   Social History   Occupational History  . Not on file  Tobacco Use  . Smoking status: Current Every Day Smoker    Packs/day: 1.00    Years: 20.00    Pack years: 20.00    Types: Cigarettes  . Smokeless tobacco: Never Used  Substance and Sexual Activity  . Alcohol use: Not  Currently  . Drug use: Yes    Types: Cocaine    Comment: admits to last use 2-3 weeks ago  . Sexual activity: Not on file

## 2018-10-03 ENCOUNTER — Other Ambulatory Visit: Payer: Self-pay | Admitting: Pharmacist

## 2018-10-03 DIAGNOSIS — B182 Chronic viral hepatitis C: Secondary | ICD-10-CM

## 2018-10-03 MED ORDER — SOFOSBUVIR-VELPATASVIR 400-100 MG PO TABS: 1.0000 | ORAL_TABLET | Freq: Every day | ORAL | 2 refills | Status: DC

## 2018-10-03 NOTE — Progress Notes (Signed)
Sending in Epclusa x 12 weeks for patient's chronic Hepatitis C infection.

## 2018-10-05 ENCOUNTER — Ambulatory Visit: Payer: Self-pay | Attending: Family Medicine | Admitting: Family Medicine

## 2018-10-05 ENCOUNTER — Telehealth: Payer: Self-pay | Admitting: Family Medicine

## 2018-10-05 ENCOUNTER — Other Ambulatory Visit: Payer: Self-pay

## 2018-10-05 ENCOUNTER — Encounter: Payer: Self-pay | Admitting: Family Medicine

## 2018-10-05 DIAGNOSIS — M0089 Polyarthritis due to other bacteria: Secondary | ICD-10-CM

## 2018-10-05 DIAGNOSIS — M01X9 Direct infection of multiple joints in infectious and parasitic diseases classified elsewhere: Secondary | ICD-10-CM

## 2018-10-05 DIAGNOSIS — G894 Chronic pain syndrome: Secondary | ICD-10-CM

## 2018-10-05 DIAGNOSIS — R911 Solitary pulmonary nodule: Secondary | ICD-10-CM

## 2018-10-05 DIAGNOSIS — M4807 Spinal stenosis, lumbosacral region: Secondary | ICD-10-CM

## 2018-10-05 DIAGNOSIS — A498 Other bacterial infections of unspecified site: Secondary | ICD-10-CM

## 2018-10-05 DIAGNOSIS — R29898 Other symptoms and signs involving the musculoskeletal system: Secondary | ICD-10-CM

## 2018-10-05 DIAGNOSIS — R918 Other nonspecific abnormal finding of lung field: Secondary | ICD-10-CM

## 2018-10-05 NOTE — Progress Notes (Signed)
Patient verified DOB Patient has eaten today and taken medication today. Patient has constant falls when getting out of bed. Patient states he has to use the walker again. He loses feeling in the lower extremities for about an hour and hour and half. Last fall was yesterday. Flexeril provides no relief.

## 2018-10-05 NOTE — Telephone Encounter (Signed)
New Message   Pt is calling to check on some medication Dr. Chapman Fitch said she would call in for him. Please f/u

## 2018-10-05 NOTE — Progress Notes (Signed)
Virtual Visit via Telephone Note  I connected with [name] on 10/05/18 at  3:50 PM EDT by telephone and verified that I am speaking with the correct person using two identifiers.   I discussed the limitations, risks, security and privacy concerns of performing an evaluation and management service by telephone and the availability of in person appointments. I also discussed with the patient that there may be a patient responsible charge related to this service. The patient expressed understanding and agreed to proceed.  Patient Location: Home Provider Location: Office at [facility] Others participating in call: call initiated by [place], CMA who then transferred the call to me   History of Present Illness:      53 year old male, patient of Dr. Alvis Lemmings, status post prolonged hospitalization from 06/19/2018 through 08/10/2018 (52 days) due to right upper arm pain and complaint of severe back pain and he was found to have intrafascial abscess of the right shoulder along with septic arthritis in the right SI joint with hospitalization complicated by lumbar spine discitis, paraspinal abscess and left knee infected arthritis per hospital discharge.  He was treated with IV antibiotics, vancomycin from May 15 through August 09, 2018.        Patient with complaint of continued pain in his lower back as well as sensation of bilateral lower extremity edema.  Patient reports that yesterday he was attempting to get out of bed when he fell to the floor.  Patient states that he feels that his lower legs are weak and he now has to use his walker in order to move within his home.  He had been able to progress with physical therapy and was able to use a cane or 2 brace himself against the wall or other objects for short distances.  He reports that now he has recurrent sensation of numbness in his legs which will last for a few hours.  Patient also has occasional right arm numbness which has occurred since his  hospitalization and subsequent surgery.  Patient states that he did have onset of back pain prior to his hospitalization.  He reports that about 2 weeks prior to hospitalization he was trying to help his dad lift a mower and patient states that he had sudden onset of severe back pain which caused him to drop to his knees.  Patient reports that the Flexeril another medications that he is currently taking are having no effect on his pain.  He states that his pain is always between 8 and greater than 10 on a 0-to-10 scale.  Patient reports difficulty sleeping secondary to pain in his right arm/shoulder right lower back and radiation of pain down the right leg.  Upon questioning he does admit to a history of substance abuse but reports no recent use of any illegal substances.  He also states that he is being followed by infectious disease doctor for chronic hepatitis as well as followed by orthopedics.  He states that these doctors are not providing adequate pain medication and that they suggested that he follow-up with his primary care physician.       On review of systems, he denies any bladder or bowel dysfunction.  He denies any urinary incontinence, no dysuria.  No issues initiating his urinary stream.  He denies any abdominal pain but has some occasional nausea.  No constipation or diarrhea.  No recent fever or chills.  Patient does have fatigue.  He denies any chest pain or palpitations.  He denies cough.  He does have some shortness of breath/fatigue with attempted activity.  Past Medical History:  Diagnosis Date  . Acid reflux   . Head injury   . Hepatitis C   . Hiatal hernia     Past Surgical History:  Procedure Laterality Date  . ACROMIO-CLAVICULAR JOINT REPAIR Right 06/28/2018   Procedure: ACROMIO-CLAVICULAR JOINT IRRIGATION AND DEBRIDEMENT;  Surgeon: Cammy Copaean, Gregory Scott, MD;  [facility];  Service: Orthopedics;  Laterality: Right;  . FACIAL FRACTURE SURGERY    . IRRIGATION AND DEBRIDEMENT  SHOULDER Right 06/28/2018   Procedure: IRRIGATION AND DEBRIDEMENT SHOULDER;  Surgeon: Cammy Copaean, Gregory Scott, MD;  [facility];  Service: Orthopedics;  Laterality: Right;  . KNEE ARTHROSCOPY Left 06/23/2018   Procedure: LEFT KNEE ARTHROSCOPY KNEE I&D.;  Surgeon: Cammy Copaean, Gregory Scott, MD;  [facility];  Service: Orthopedics;  Laterality: Left;    Family History  Problem Relation Age of Onset  . Diabetes Mother   . Hypertension Mother   . Hypertension Father     Social History   Tobacco Use  . Smoking status: Current Every Day Smoker    Packs/day: 1.00    Years: 20.00    Pack years: 20.00    Types: Cigarettes  . Smokeless tobacco: Never Used  Substance Use Topics  . Alcohol use: Not Currently  . Drug use: Yes    Types: Cocaine    Comment: admits to last use 2-3 weeks ago     No Known Allergies     Observations/Objective: No vital signs or physical exam conducted as visit was done via telephone  Assessment and Plan: 1.  Arthritis of multiple sites due to bacteria Patient status post hospitalization for septic arthritis of multiple areas for which patient has had prolonged hospitalization with antibiotic therapy and outpatient follow-up with orthopedics and infectious disease.  Patient reports that he was recently told that he is still suspected to have arthritis and needs to take additional antibiotics.  Patient reports chronic pain in his left shoulder, lower back and left knee.  2. Spinal stenosis of lumbosacral region; 4.  Weakness of bilateral lower extremities Patient reports weakness of his lower extremities as well as being told that he has stenosis in the lumbar spine.  Patient's most recent MRI done 08/09/2018 was consistent with right SI joint septic arthritis with extension of infection into the right L4-5 facet joint which was previously noted.  Patient also had improvement in multiple areas of soft tissue abscess in the retroperitoneum on the right and in the right  posterior paraspinous muscles.  Patient back pain and leg weakness is likely related to his septic arthritis/bacteremia, deconditioning from prolonged hospitalization and patient also does have a L5-S1 grade 1 anterior listhesis on MRI.  Hopefully, patient will be eligible for an insurance program so that he can attend outpatient physical therapy or may need consideration for rehabilitation facility stay to help regain strength, prevent fall risk and improve conditioning.  3. Lung nodule Patient with CT scan done on 09/05/2018 showing a large left pleural effusion with atelectasis which was improved from 08/01/2018 as well as left sternoclavicular arthritis which was seen on prior CT scan.  Patient however also with a 12 x 6 mm suspicious right upper lobe nodule for which 6451-month follow-up CT scan was recommended.  Order will be placed for patient to have repeat chest CT in late October but sooner of course if patient develops any issues with chest pain, shortness of breath, night sweats, weight loss  or any other concerns.  5. Chronic pain disorder; 6.  History of substance abuse Patient currently is uninsured and is having issues with chronic neuropathic pain as well as musculoskeletal pain related to lumbar spinal stenosis, lumbar radiculopathy and patient with septic arthritis.  Patient repeatedly request oxycodone which he states that he was previously given by orthopedics.  Patient will have increase in his  gabapentin from twice daily to 3 times daily, refill of Flexeril and prescription for diclofenac 50 mg up to 3 times daily to take after eating to help with pain.  Patient's most recent creatinine was normal.  He does have some mild anemia with last hemoglobin of 11.9 and he is currently on iron therapy.  I will send staff message to patient's orthopedic doctor and infectious disease doctor to see if either of his specialty doctors would be willing to prescribe any stronger medication.  Patient reports  that tramadol does not help with his pain.  Patient unfortunately because of his uninsured status cannot afford referral to pain management/physical medicine and rehab for interventions such as injections or other treatments for pain and due to his current ongoing septic arthritis, he likely would not be a candidate for any type of injections for pain control at this time.  Follow Up Instructions:Return in about 4 weeks (around 11/02/2018) for Chronic pain; 4-week follow-up with PCP.    I discussed the assessment and treatment plan with the patient. The patient was provided an opportunity to ask questions and all were answered. The patient agreed with the plan and demonstrated an understanding of the instructions.   The patient was advised to call back or seek an in-person evaluation if the symptoms worsen or if the condition fails to improve as anticipated.  I provided 14 minutes of non-face-to-face time during this encounter.   [name], MD

## 2018-10-06 ENCOUNTER — Encounter: Payer: Self-pay | Admitting: Family Medicine

## 2018-10-06 MED ORDER — GABAPENTIN 300 MG PO CAPS: 600.0000 mg | ORAL_CAPSULE | Freq: Three times a day (TID) | ORAL | 1 refills | Status: DC

## 2018-10-06 MED ORDER — CYCLOBENZAPRINE HCL 10 MG PO TABS: 10.0000 mg | ORAL_TABLET | Freq: Three times a day (TID) | ORAL | 0 refills | Status: DC | PRN

## 2018-10-06 MED ORDER — DICLOFENAC POTASSIUM 50 MG PO TABS: 50.0000 mg | ORAL_TABLET | Freq: Three times a day (TID) | ORAL | 0 refills | Status: DC

## 2018-10-06 NOTE — Telephone Encounter (Signed)
Informed patient that the muscle spasm medication was went to his pharmacy.

## 2018-10-11 ENCOUNTER — Telehealth: Payer: Self-pay | Admitting: Pharmacy Technician

## 2018-10-11 MED ORDER — SOFOSBUVIR-VELPATASVIR 400-100 MG PO TABS: 1.0000 | ORAL_TABLET | Freq: Every day | ORAL | 2 refills | Status: DC

## 2018-10-11 NOTE — Addendum Note (Signed)
Addended by: Darletta Moll on: 10/11/2018 10:46 AM   Modules accepted: Orders

## 2018-10-11 NOTE — Telephone Encounter (Signed)
[facility] Patient Advocate Encounter  Completed and sent [employer] application for Epclusa for this patient who is uninsured.    Patient assistance phone number for follow up is [phone].   This encounter will be updated until final determination.   Brainard Highfill E. Jayvier Burgher, CPhT [facility]ty Pharmacy Patient Advocate [facility] Phone: [phone] Fax:  [phone]   

## 2018-10-17 NOTE — Telephone Encounter (Signed)
Consuela E from patient assistance called to advise the patient has been approved advised if any questions to call and use REF# [id number]. M-F [id number]. Will forward to [employer]

## 2018-10-18 NOTE — Telephone Encounter (Signed)
Patient called requesting refill on Doxycycline. Routing to provider for advise.  Patient also transferred to pharmacy to leave voicemail because patient has not received his Epculsa.   Also routing to [place]

## 2018-10-18 NOTE — Telephone Encounter (Signed)
Patient has been approved into the [employer] Patient Assistance program to receive his medicine for the 12 weeks at no charge. His enrollment period is 10/13/2018 to 01/05/2019. [facility] will reach out to the patient to set up first shipment to his home. Program number [phone] for any questions.

## 2018-10-19 ENCOUNTER — Telehealth: Payer: Self-pay | Admitting: Pharmacy Technician

## 2018-10-19 NOTE — Telephone Encounter (Signed)
Yes, please have him continue doxycycline till he follows up with me.

## 2018-10-19 NOTE — Telephone Encounter (Signed)
Mr. Duplantis called asking if he should refill and take another month of Doxycycline.  I consulted with Dr. Hale Bogus nurse Eugenia Mcalpine who confirmed yes, he should.  I conveyed that to him and said he will call his pharmacy today to get it refilled.  Venida Jarvis. Nadara Mustard [place] Patient [facility] Phone: [phone] Fax:  [phone]

## 2018-10-30 ENCOUNTER — Telehealth: Payer: Self-pay | Admitting: Pharmacist

## 2018-10-30 ENCOUNTER — Encounter: Payer: Self-pay | Admitting: Pharmacy Technician

## 2018-10-30 NOTE — Telephone Encounter (Signed)
Called patient to counsel on Epclusa. No answer, no VM set up.

## 2018-11-28 ENCOUNTER — Ambulatory Visit: Payer: Self-pay | Admitting: Pharmacist

## 2018-12-01 ENCOUNTER — Ambulatory Visit: Payer: Medicaid Other | Admitting: Pharmacist

## 2018-12-28 ENCOUNTER — Other Ambulatory Visit: Payer: Self-pay

## 2018-12-28 ENCOUNTER — Ambulatory Visit (INDEPENDENT_AMBULATORY_CARE_PROVIDER_SITE_OTHER): Payer: Medicaid Other | Admitting: Pharmacist

## 2018-12-28 ENCOUNTER — Telehealth: Payer: Self-pay | Admitting: Family Medicine

## 2018-12-28 ENCOUNTER — Ambulatory Visit (INDEPENDENT_AMBULATORY_CARE_PROVIDER_SITE_OTHER): Payer: Medicaid Other | Admitting: Internal Medicine

## 2018-12-28 DIAGNOSIS — Z23 Encounter for immunization: Secondary | ICD-10-CM | POA: Diagnosis not present

## 2018-12-28 DIAGNOSIS — M79662 Pain in left lower leg: Secondary | ICD-10-CM | POA: Diagnosis not present

## 2018-12-28 DIAGNOSIS — B182 Chronic viral hepatitis C: Secondary | ICD-10-CM | POA: Diagnosis not present

## 2018-12-28 DIAGNOSIS — M4647 Discitis, unspecified, lumbosacral region: Secondary | ICD-10-CM | POA: Diagnosis not present

## 2018-12-28 DIAGNOSIS — M7989 Other specified soft tissue disorders: Secondary | ICD-10-CM

## 2018-12-28 MED ORDER — DOXYCYCLINE HYCLATE 50 MG PO CAPS: 100.0000 mg | ORAL_CAPSULE | Freq: Two times a day (BID) | ORAL | 1 refills | Status: DC

## 2018-12-28 NOTE — Progress Notes (Signed)
HPI: [name] is a 53 y.o. male who presents to the [facility] pharmacy clinic for Hepatitis C follow-up.  Medication: Dorita Fray  Start Date: 10/28/2018  Hepatitis C Genotype: 3  Fibrosis Score: F0  Hepatitis C RNA: 65,000 09/21/2018  Patient Active Problem List   Diagnosis Date Noted  . Diarrhea 09/07/2018  . Septic arthritis of shoulder, right (HCC) 09/07/2018  . Septic arthritis of sacroiliac joint (HCC) 09/07/2018  . Sternoclavicular joint pain, left 09/07/2018  . Discitis of lumbosacral region 06/28/2018  . Psoas abscess, right (HCC) 06/28/2018  . Polysubstance abuse (HCC) 06/24/2018  . Normocytic anemia 06/24/2018  . Recurrent boils 06/24/2018  . Head injury   . Septic arthritis of knee, left (HCC)   . Acute medial meniscal tear, left, initial encounter   . GERD (gastroesophageal reflux disease) 06/22/2018  . Mild protein-calorie malnutrition (HCC) 06/22/2018  . AKI (acute kidney injury) (HCC) 06/21/2018  . Spinal stenosis of lumbosacral region 06/21/2018  . Chronic hepatitis C (HCC) 12/17/2013  . Cigarette smoker 12/17/2013  . Family history of diabetes mellitus (DM) 12/17/2013  . Notalgia 12/17/2013    Patient's Medications  New Prescriptions   No medications on file  Previous Medications   CYCLOBENZAPRINE (FLEXERIL) 10 MG TABLET    Take 1 tablet (10 mg total) by mouth 3 (three) times daily as needed for muscle spasms.   DIAZEPAM (VALIUM) 5 MG TABLET    Take 1 tablet (5 mg total) by mouth every 8 (eight) hours as needed for anxiety (vertigo).   DICLOFENAC (CATAFLAM) 50 MG TABLET    Take 1 tablet (50 mg total) by mouth 3 (three) times daily. As needed for pain.  Take after eating   DOXYCYCLINE (VIBRAMYCIN) 50 MG CAPSULE    Take 2 capsules (100 mg total) by mouth 2 (two) times daily.   FERROUS SULFATE 325 (65 FE) MG TABLET    Take 1 tablet (325 mg total) by mouth 2 (two) times daily with a meal.   GABAPENTIN (NEURONTIN) 300 MG CAPSULE    Take 2 capsules (600 mg  total) by mouth 3 (three) times daily.   HYDROXYZINE (ATARAX/VISTARIL) 25 MG TABLET    Take 1 tablet (25 mg total) by mouth 3 (three) times daily as needed.   MIRTAZAPINE (REMERON) 15 MG TABLET    Take 1 tablet (15 mg total) by mouth at bedtime.   OXYCODONE-ACETAMINOPHEN (PERCOCET/ROXICET) 5-325 MG TABLET    Take 1 tablet by mouth every 6 (six) hours as needed for severe pain.   SOFOSBUVIR-VELPATASVIR (EPCLUSA) 400-100 MG TABS    Take 1 tablet by mouth daily.   VITAMIN B-12 100 MCG TABLET    Take 1 tablet (100 mcg total) by mouth daily.  Modified Medications   No medications on file  Discontinued Medications   No medications on file    Allergies: No Known Allergies  Past Medical History: Past Medical History:  Diagnosis Date  . Acid reflux   . Head injury   . Hepatitis C   . Hiatal hernia     Social History: Social History   Socioeconomic History  . Marital status: Single    Spouse name: Not on file  . Number of children: Not on file  . Years of education: Not on file  . Highest education level: Not on file  Occupational History  . Not on file  Social Needs  . Financial resource strain: Not on file  . Food insecurity    Worry: Not on file  Inability: Not on file  . Transportation needs    Medical: Not on file    Non-medical: Not on file  Tobacco Use  . Smoking status: Current Every Day Smoker    Packs/day: 1.00    Years: 20.00    Pack years: 20.00    Types: Cigarettes  . Smokeless tobacco: Never Used  Substance and Sexual Activity  . Alcohol use: Not Currently  . Drug use: Yes    Types: Cocaine    Comment: admits to last use 2-3 weeks ago  . Sexual activity: Not Currently  Lifestyle  . Physical activity    Days per week: Not on file    Minutes per session: Not on file  . Stress: Not on file  Relationships  . Social Herbalist on phone: Not on file    Gets together: Not on file    Attends religious service: Not on file    Active member of  club or organization: Not on file    Attends meetings of clubs or organizations: Not on file    Relationship status: Not on file  Other Topics Concern  . Not on file  Social History Narrative  . Not on file    Labs: Hepatitis C Lab Results  Component Value Date   HCVGENOTYPE 3 06/24/2018   HCVRNAPCRQN 65,000 (H) 09/21/2018   FIBROSTAGE F0 09/21/2018   Hepatitis B Lab Results  Component Value Date   HEPBSAB NEG 01/08/2014   HEPBSAG Negative 06/24/2018   Hepatitis A No results found for: HAV HIV Lab Results  Component Value Date   HIV Non Reactive 06/20/2018   HIV NONREACTIVE 11/24/2013   Lab Results  Component Value Date   CREATININE 0.94 09/07/2018   CREATININE 0.73 09/05/2018   CREATININE 0.71 08/08/2018   CREATININE 0.74 08/05/2018   CREATININE 0.66 08/03/2018   Lab Results  Component Value Date   AST 19 09/07/2018   AST 18 08/08/2018   AST 15 07/25/2018   ALT 23 09/21/2018   ALT 34 09/07/2018   ALT 40 08/08/2018   INR 0.9 09/21/2018   INR 1.00 11/27/2013    Assessment: [name] is here to follow up for his hepatitis C treatment with Epclusa. He has missed a few appointments with Cassie we had scheduled to see how he was responding to treatment. He presents today 2 months into therapy and has no complaints. He states he has had perfect adherence not missing any doses. He had some days of "feeling off" when he first started the [place] but these resolved on their own in a few days. I stressed the importance of adherence to him and he understood. I also explained that he needs to come in for the follow up visits we schedule for him, not only to see how he is doing but to get labs and see how he is responding to therapy. He understood this as well and had no further questions. We will see him back in a month when he has completed treatment.    Plan: - Continue Epclusa x1 more month - HCV RNA - Follow up in a month    Nicoletta Dress, PharmD PGY2 Infectious  Disease Pharmacy Resident  [facility] 12/28/2018, 3:08 PM

## 2018-12-28 NOTE — Assessment & Plan Note (Signed)
It is certainly possible that is disseminated septic arthritis, vertebral infection and psoas abscess have been cured but I will check lab work today and restart doxycycline and have him follow-up in 1 month.

## 2018-12-28 NOTE — Assessment & Plan Note (Signed)
I will check his hepatitis C viral load and get him back in 1 month for end of treatment testing as well.

## 2018-12-28 NOTE — Progress Notes (Signed)
[facility]  Patient Active Problem List   Diagnosis Date Noted   Septic arthritis of shoulder, right ([place]) 09/07/2018    Priority: High   Septic arthritis of sacroiliac joint ([place]) 09/07/2018    Priority: High   Sternoclavicular joint pain, left 09/07/2018    Priority: High   Discitis of lumbosacral region 06/28/2018    Priority: High   Psoas abscess, right ([place]) 06/28/2018    Priority: High   Septic arthritis of knee, left (HCC)     Priority: High   Pain and swelling of left lower leg 12/28/2018    Priority: Medium   Chronic hepatitis C ([place]) 12/17/2013    Priority: Medium   Polysubstance abuse ([place]) 06/24/2018   Normocytic anemia 06/24/2018   Recurrent boils 06/24/2018   Head injury    Acute medial meniscal tear, left, initial encounter    GERD (gastroesophageal reflux disease) 06/22/2018   Mild protein-calorie malnutrition ([place]) 06/22/2018   AKI (acute kidney injury) ([place]) 06/21/2018   Spinal stenosis of lumbosacral region 06/21/2018   Cigarette smoker 12/17/2013   Family history of diabetes mellitus (DM) 12/17/2013   Notalgia 12/17/2013    Patient's Medications  New Prescriptions   No medications on file  Previous Medications   CYCLOBENZAPRINE (FLEXERIL) 10 MG TABLET    Take 1 tablet (10 mg total) by mouth 3 (three) times daily as needed for muscle spasms.   DIAZEPAM (VALIUM) 5 MG TABLET    Take 1 tablet (5 mg total) by mouth every 8 (eight) hours as needed for anxiety (vertigo).   DICLOFENAC (CATAFLAM) 50 MG TABLET    Take 1 tablet (50 mg total) by mouth 3 (three) times daily. As needed for pain.  Take after eating   FERROUS SULFATE 325 (65 FE) MG TABLET    Take 1 tablet (325 mg total) by mouth 2 (two) times daily with a meal.   GABAPENTIN (NEURONTIN) 300 MG CAPSULE    Take 2 capsules (600 mg total) by mouth 3 (three) times daily.   HYDROXYZINE (ATARAX/VISTARIL) 25 MG TABLET    Take 1 tablet (25 mg total) by  mouth 3 (three) times daily as needed.   MIRTAZAPINE (REMERON) 15 MG TABLET    Take 1 tablet (15 mg total) by mouth at bedtime.   OXYCODONE-ACETAMINOPHEN (PERCOCET/ROXICET) 5-325 MG TABLET    Take 1 tablet by mouth every 6 (six) hours as needed for severe pain.   SOFOSBUVIR-VELPATASVIR (EPCLUSA) 400-100 MG TABS    Take 1 tablet by mouth daily.   VITAMIN B-12 100 MCG TABLET    Take 1 tablet (100 mcg total) by mouth daily.  Modified Medications   Modified Medication Previous Medication   DOXYCYCLINE (VIBRAMYCIN) 50 MG CAPSULE doxycycline (VIBRAMYCIN) 50 MG capsule      Take 2 capsules (100 mg total) by mouth 2 (two) times daily.    Take 2 capsules (100 mg total) by mouth 2 (two) times daily.  Discontinued Medications   DOXYCYCLINE (VIBRAMYCIN) 100 MG CAPSULE    Take 100 mg by mouth 2 (two) times daily.    Subjective: [name] is in for his hospital follow-up visit.  He was recently hospitalized from 06/19/2018-08/10/2018.  He was found to have widely disseminated septic arthritis.  He had involvement of his left knee, right shoulder, lumbosacral spine, right sacroiliac joint and left sternoclavicular joint.  Gram stain from his left knee synovial fluid showed gram-positive cocci but all cultures were negative.  He received 6 weeks of IV vancomycin before transitioning to oral doxycycline on discharge.  Unfortunately he missed several recent scheduled visits and his prescription ran out.  He has been off of doxycycline for about 3 weeks.  He is still having some back pain and pain in his left shoulder.  About 2 weeks ago he developed acute pain and swelling in his left calf.  He does not recall any injury.  He has had difficulty putting any pressure on his left foot since that time.  He has not had any fever, chills or sweats.  He has a history of polysubstance abuse.  He says that he was using cocaine intermittently (denies any injecting drug use) and drinking alcohol but states that he has not had any  drugs or alcohol since discharge.  He has chronic hepatitis C, genotype 3, with a recent viral load of 17,600.  He started on Epclusa on 10/03/2018.  He has missed several visits with our ID pharmacist.  However he states that he has not missed any doses of his Epclusa.  Initially he had some nausea but he is tolerating it well now.  Review of Systems: Review of Systems  Constitutional: Negative for chills, diaphoresis, fever, malaise/fatigue and weight loss.  HENT: Negative for congestion and sore throat.   Respiratory: Negative for cough, sputum production and shortness of breath.   Cardiovascular: Negative for chest pain.  Gastrointestinal: Negative for abdominal pain, diarrhea, nausea and vomiting.  Genitourinary: Negative for dysuria.  Musculoskeletal: Positive for back pain and joint pain.       Left calf pain and swelling.  Neurological: Negative for focal weakness.    Past Medical History:  Diagnosis Date   Acid reflux    Head injury    Hepatitis C    Hiatal hernia     Social History   Tobacco Use   Smoking status: Current Every Day Smoker    Packs/day: 1.00    Years: 20.00    Pack years: 20.00    Types: Cigarettes   Smokeless tobacco: Never Used  Substance Use Topics   Alcohol use: Not Currently   Drug use: Yes    Types: Cocaine    Comment: admits to last use 2-3 weeks ago    Family History  Problem Relation Age of Onset   Diabetes Mother    Hypertension Mother    Hypertension Father     No Known Allergies  Objective: Vitals:   12/28/18 1508  BP: (!) 141/98  Pulse: (!) 101  Temp: 97.9 F (36.6 C)  TempSrc: Oral  Weight: 180 lb (81.6 kg)   Body mass index is 25.83 kg/m.  Physical Exam Constitutional:      Comments: He looks much healthier today.  He has gained about 40 pounds.  He does appear to be uncomfortable because of his left leg pain.  Cardiovascular:     Rate and Rhythm: Normal rate and regular rhythm.     Heart sounds: No  murmur.  Pulmonary:     Effort: Pulmonary effort is normal.     Breath sounds: Normal breath sounds. No wheezing or rales.  Abdominal:     Palpations: Abdomen is soft.     Tenderness: There is no abdominal tenderness.  Musculoskeletal:        General: Swelling and tenderness present.     Comments: He has some continued chronic swelling over his left sternoclavicular joint but no erythema, warmth fluctuance or pain with palpation.  He  has firm, diffuse induration of his left posterior calf that is painful to palpation.  There is no unusual redness or warmth.     Skin:    Findings: No rash.     Lab Results Sed Rate  Date Value  09/07/2018 65 mm/h (H)  08/01/2018 >140 mm/hr (H)  06/20/2018 80 mm/hr (H)   CRP  Date Value  09/07/2018 16.0 mg/L (H)  08/01/2018 24.0 mg/dL (H)  06/20/2018 24.1 mg/dL (H)      Problem List Items Addressed This Visit      High   Discitis of lumbosacral region    It is certainly possible that is disseminated septic arthritis, vertebral infection and psoas abscess have been cured but I will check lab work today and restart doxycycline and have him follow-up in 1 month.        Medium   Pain and swelling of left lower leg    I am concerned about acute DVT.  I will check a Doppler ultrasound.  I have asked him to schedule a follow-up visit with his PCP as soon as possible.      Relevant Orders   LE VENOUS   Chronic hepatitis C ([place])    I will check his hepatitis C viral load and get him back in 1 month for end of treatment testing as well.      Relevant Orders   CBC   CMP   C-reactive protein   Sed Rate (ESR)   Hepatitis C RNA quantitative    Other Visit Diagnoses    Need for immunization against influenza       Relevant Orders   Flu Vaccine QUAD 36+ mos IM (Completed)       Michel Bickers, MD [facility] for [place] [phone] pager   [phone] cell 12/28/2018, 5:36 PM

## 2018-12-28 NOTE — Telephone Encounter (Signed)
Pt came to the office to request a new Pain medication since the one you put it on is not working, please call, him back to talk about what new medication you will sent him, since the Specialist told him that need to to be in one

## 2018-12-28 NOTE — Assessment & Plan Note (Signed)
I am concerned about acute DVT.  I will check a Doppler ultrasound.  I have asked him to schedule a follow-up visit with his PCP as soon as possible.

## 2018-12-29 ENCOUNTER — Telehealth (HOSPITAL_COMMUNITY): Payer: Self-pay | Admitting: *Deleted

## 2018-12-29 NOTE — Telephone Encounter (Signed)
Johnson Please advise patient being PCP is OOO.

## 2018-12-29 NOTE — Telephone Encounter (Addendum)
01/01/2019 tried home and cell no answer no voicemail set up. 12/29/18 Attempting to reach pt to schedule DVT study.

## 2018-12-29 NOTE — Telephone Encounter (Signed)
Patient called again requesting to speak with PCP regarding his pain medication. Patient states the medication is not helping him and would like a new prescription. Please f/u

## 2019-01-01 ENCOUNTER — Telehealth: Payer: Self-pay | Admitting: *Deleted

## 2019-01-01 ENCOUNTER — Other Ambulatory Visit: Payer: Self-pay

## 2019-01-01 ENCOUNTER — Telehealth: Payer: Self-pay | Admitting: Family Medicine

## 2019-01-01 ENCOUNTER — Ambulatory Visit (HOSPITAL_COMMUNITY)
Admission: RE | Admit: 2019-01-01 | Discharge: 2019-01-01 | Disposition: A | Discharge status: Home / Self Care | Payer: Medicaid Other | Source: Ambulatory Visit | Attending: Family | Admitting: Family

## 2019-01-01 ENCOUNTER — Telehealth: Payer: Self-pay

## 2019-01-01 DIAGNOSIS — M7989 Other specified soft tissue disorders: Secondary | ICD-10-CM | POA: Diagnosis not present

## 2019-01-01 DIAGNOSIS — M79662 Pain in left lower leg: Secondary | ICD-10-CM | POA: Insufficient documentation

## 2019-01-01 MED ORDER — MELOXICAM 7.5 MG PO TABS: 7.5000 mg | ORAL_TABLET | Freq: Every day | ORAL | 1 refills | Status: DC

## 2019-01-01 NOTE — Telephone Encounter (Signed)
Juliann Pulse at [facility] outpatient imaging center is having difficulty contacting patient to schedule DVT study ordered by Dr Megan Salon on 11/19. She attempted last week and this morning to contact him, but all listed numbers are out of order.  RN offered to help, found the same result with patient's listed phone numbers as well as his father's (listed as emergency contact).  RN was able to speak with patient's daughter [name]. She states she is not always able to get in touch with him, but will try today.  RN relayed that Juliann Pulse is trying to schedule patient for study at 3:30 today, location at [place] will call me back. RN updated Juliann Pulse and will follow. Landis Gandy, RN

## 2019-01-01 NOTE — Telephone Encounter (Signed)
Patient called requesting pain medication because he has two knots on his back and he is in pain. Patient states he does not want meloxicam (MOBIC) 7.5 MG tablet. Please f/u

## 2019-01-01 NOTE — Telephone Encounter (Signed)
Case number [id number] Approved per [employer], valid 01/01/19 - 06/30/19. CPT [phone] to be done at [facility].  Approval [phone]. RN notified Juliann Pulse, patient has arrived for study. Landis Gandy, RN

## 2019-01-01 NOTE — Telephone Encounter (Signed)
I will call him.

## 2019-01-01 NOTE — Telephone Encounter (Signed)
[name] was able to get in touch with her father.  RN reached out to patient, notified him of [name] trying to get in touch with him, relayed the appointment, [name]'s phone number.  Patient will call [name] now. Landis Gandy, RN

## 2019-01-01 NOTE — Telephone Encounter (Signed)
Patient is aware of medication being sent to pharmacy.

## 2019-01-01 NOTE — Telephone Encounter (Signed)
Patient calling to see if you can change pain medication. Please give him a call in regards to medication management.

## 2019-01-01 NOTE — Telephone Encounter (Signed)
A prescription for meloxicam sent to pharmacy.

## 2019-01-01 NOTE — Telephone Encounter (Signed)
DVT study report: Negative for DVT to LE; Study did show hematoma to proximal lateral calf. Routing to provider to make aware.  [name]

## 2019-01-02 ENCOUNTER — Telehealth: Payer: Self-pay | Admitting: Internal Medicine

## 2019-01-02 LAB — CBC
HCT: 43.4 % (ref 38.5–50.0)
Hemoglobin: 14.2 g/dL (ref 13.2–17.1)
MCH: 28.7 pg (ref 27.0–33.0)
MCHC: 32.7 g/dL (ref 32.0–36.0)
MCV: 87.9 fL (ref 80.0–100.0)
MPV: 9.6 fL (ref 7.5–12.5)
Platelets: 454 10*3/uL — ABNORMAL HIGH (ref 140–400)
RBC: 4.94 10*6/uL (ref 4.20–5.80)
RDW: 15.5 % — ABNORMAL HIGH (ref 11.0–15.0)
WBC: 11.3 10*3/uL — ABNORMAL HIGH (ref 3.8–10.8)

## 2019-01-02 LAB — COMPREHENSIVE METABOLIC PANEL
AG Ratio: 1.3 (calc) (ref 1.0–2.5)
ALT: 12 U/L (ref 9–46)
AST: 13 U/L (ref 10–35)
Albumin: 4.3 g/dL (ref 3.6–5.1)
Alkaline phosphatase (APISO): 137 U/L (ref 35–144)
BUN: 17 mg/dL (ref 7–25)
CO2: 27 mmol/L (ref 20–32)
Calcium: 10.2 mg/dL (ref 8.6–10.3)
Chloride: 100 mmol/L (ref 98–110)
Creat: 1.04 mg/dL (ref 0.70–1.33)
Globulin: 3.2 g/dL (calc) (ref 1.9–3.7)
Glucose, Bld: 93 mg/dL (ref 65–99)
Potassium: 4.7 mmol/L (ref 3.5–5.3)
Sodium: 137 mmol/L (ref 135–146)
Total Bilirubin: 0.2 mg/dL (ref 0.2–1.2)
Total Protein: 7.5 g/dL (ref 6.1–8.1)

## 2019-01-02 LAB — HEPATITIS C RNA QUANTITATIVE
HCV Quantitative Log: <1.18 Log IU/mL
HCV RNA, PCR, QN: <15 IU/mL

## 2019-01-02 LAB — C-REACTIVE PROTEIN: CRP: 13.8 mg/L — ABNORMAL HIGH (ref <8.0)

## 2019-01-02 LAB — SEDIMENTATION RATE: Sed Rate: 43 mm/h — ABNORMAL HIGH (ref 0–20)

## 2019-01-02 NOTE — Telephone Encounter (Signed)
Forwarding to Dr. Newlin 

## 2019-01-02 NOTE — Telephone Encounter (Signed)
I called and spoke with [name] this afternoon.  I let him know that Doppler ultrasound of his calf did not show any evidence of DVT but did show evidence of hematoma.  He is still having quite a bit of pain but says the swelling in his calf continues to decrease.  He is taking his Epclusa and doxycycline.  He has follow-up arranged here already.  He is in the process of making a follow-up visit with his PCP.

## 2019-01-02 NOTE — Telephone Encounter (Signed)
Please see opening line from my notes from 09/08/18. Patient was scheduled to see to Dr Chapman Fitch to establish care. I saw him because she was out.  I have prescribed Mobic and any additional medication would have to come from her or visit with her.  Thanks

## 2019-01-03 NOTE — Telephone Encounter (Signed)
Thanks, I forwarded the message to you as I am out of office but I continue to receive messages. I did the out thing but I still get the messages

## 2019-01-15 ENCOUNTER — Ambulatory Visit: Payer: Medicaid Other | Attending: Family Medicine | Admitting: Family Medicine

## 2019-01-15 DIAGNOSIS — F419 Anxiety disorder, unspecified: Secondary | ICD-10-CM

## 2019-01-15 DIAGNOSIS — A498 Other bacterial infections of unspecified site: Secondary | ICD-10-CM

## 2019-01-15 DIAGNOSIS — M01X9 Direct infection of multiple joints in infectious and parasitic diseases classified elsewhere: Secondary | ICD-10-CM | POA: Diagnosis not present

## 2019-01-15 DIAGNOSIS — G629 Polyneuropathy, unspecified: Secondary | ICD-10-CM | POA: Diagnosis not present

## 2019-01-15 DIAGNOSIS — M79605 Pain in left leg: Secondary | ICD-10-CM | POA: Diagnosis not present

## 2019-01-15 DIAGNOSIS — M0089 Polyarthritis due to other bacteria: Secondary | ICD-10-CM

## 2019-01-15 DIAGNOSIS — N528 Other male erectile dysfunction: Secondary | ICD-10-CM | POA: Diagnosis not present

## 2019-01-15 MED ORDER — SILDENAFIL CITRATE 50 MG PO TABS: 50.0000 mg | ORAL_TABLET | Freq: Every day | ORAL | 0 refills | Status: DC | PRN

## 2019-01-15 MED ORDER — MELOXICAM 7.5 MG PO TABS: 7.5000 mg | ORAL_TABLET | Freq: Every day | ORAL | 1 refills | Status: DC

## 2019-01-15 MED ORDER — DULOXETINE HCL 60 MG PO CPEP: 60.0000 mg | ORAL_CAPSULE | Freq: Every day | ORAL | 3 refills | Status: DC

## 2019-01-15 MED ORDER — CYCLOBENZAPRINE HCL 10 MG PO TABS: 10.0000 mg | ORAL_TABLET | Freq: Three times a day (TID) | ORAL | 0 refills | Status: DC | PRN

## 2019-01-15 MED ORDER — HYDROXYZINE HCL 25 MG PO TABS: 25.0000 mg | ORAL_TABLET | Freq: Three times a day (TID) | ORAL | 1 refills | Status: DC | PRN

## 2019-01-15 NOTE — Progress Notes (Signed)
Virtual Visit via Telephone Note  I connected with [name], on 01/15/2019 at 2:51 PM by telephone due to the COVID-19 pandemic and verified that I am speaking with the correct person using two identifiers.   Consent: I discussed the limitations, risks, security and privacy concerns of performing an evaluation and management service by telephone and the availability of in person appointments. I also discussed with the patient that there may be a patient responsible charge related to this service. The patient expressed understanding and agreed to proceed.   Location of Patient: Home  Location of Provider: Clinic   Persons participating in Telemedicine visit: Penny Pia Dr. Alvis Lemmings     History of Present Illness: [name] is a 53 year old male initially scheduled to see Dr. Jillyn Hidden today with a previous history of IV drug abuse, disseminated septic arthritis (with involvement of his right shoulder, left knee, lumbosacral spine and sacroiliac joint), chronic hepatitis C who presents for follow-up visit.   He complains of pain in his left calf x 3weeks. He was walking down the hallway with his walker, felt a sharp pain in his left leg and subsequently went down to his knees and since then he has swelling of his calf. He is doing less walking and the swelling has improved slighlty. Ultrasound of his left calf ordered by infectious disease was negative for DVT.  He denies preceding history of trauma.  He remains on doxycycline prescribed by infectious disease. He complains of burning in his feet; he was on gabapentin which he states he was intolerant to. He does not drink alcohol He has knots in his back and has pain which is severe and uncontrolled.  I had sent a prescription for meloxicam to his pharmacy which he informs me he never obtained. Complains of erectile dysfunction and is requesting prescription for this. Past Medical History:  Diagnosis Date   . Acid reflux   . Head injury   . Hepatitis C   . Hiatal hernia    No Known Allergies  Current Outpatient Medications on File Prior to Visit  Medication Sig Dispense Refill  . doxycycline (VIBRAMYCIN) 50 MG capsule Take 2 capsules (100 mg total) by mouth 2 (two) times daily. 60 capsule 1  . gabapentin (NEURONTIN) 300 MG capsule Take 2 capsules (600 mg total) by mouth 3 (three) times daily. 60 capsule 1  . Sofosbuvir-Velpatasvir (EPCLUSA) 400-100 MG TABS Take 1 tablet by mouth daily. 28 tablet 2  . vitamin B-12 100 MCG tablet Take 1 tablet (100 mcg total) by mouth daily.    . cyclobenzaprine (FLEXERIL) 10 MG tablet Take 1 tablet (10 mg total) by mouth 3 (three) times daily as needed for muscle spasms. (Patient not taking: Reported on 01/15/2019) 90 tablet 0  . diazepam (VALIUM) 5 MG tablet Take 1 tablet (5 mg total) by mouth every 8 (eight) hours as needed for anxiety (vertigo). (Patient not taking: Reported on 01/15/2019) 30 tablet 0  . diclofenac (CATAFLAM) 50 MG tablet Take 1 tablet (50 mg total) by mouth 3 (three) times daily. As needed for pain.  Take after eating (Patient not taking: Reported on 01/15/2019) 90 tablet 0  . ferrous sulfate 325 (65 FE) MG tablet Take 1 tablet (325 mg total) by mouth 2 (two) times daily with a meal. (Patient not taking: Reported on 01/15/2019)  3  . hydrOXYzine (ATARAX/VISTARIL) 25 MG tablet Take 1 tablet (25 mg total) by mouth 3 (three) times daily as needed. (Patient not taking: Reported  on 01/15/2019) 60 tablet 1  . meloxicam (MOBIC) 7.5 MG tablet Take 1 tablet (7.5 mg total) by mouth daily. (Patient not taking: Reported on 01/15/2019) 30 tablet 1  . mirtazapine (REMERON) 15 MG tablet Take 1 tablet (15 mg total) by mouth at bedtime. (Patient not taking: Reported on 01/15/2019) 30 tablet 1  . oxyCODONE-acetaminophen (PERCOCET/ROXICET) 5-325 MG tablet Take 1 tablet by mouth every 6 (six) hours as needed for severe pain. (Patient not taking: Reported on 10/05/2018) 20  tablet 0   No current facility-administered medications on file prior to visit.     Observations/Objective: Awake, alert, oriented x3 Not in acute distress due to pain  Assessment and Plan: 1. 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  Dispense: 90 tablet; Refill: 0 - meloxicam (MOBIC) 7.5 MG tablet; Take 1 tablet (7.5 mg total) by mouth daily.  Dispense: 30 tablet; Refill: 1  2. Anxiety Uncontrolled due to underlying medical conditions Refilled hydroxyzine - hydrOXYzine (ATARAX/VISTARIL) 25 MG tablet; Take 1 tablet (25 mg total) by mouth 3 (three) times daily as needed.  Dispense: 60 tablet; Refill: 1  3. Other male erectile dysfunction - sildenafil (VIAGRA) 50 MG tablet; Take 1 tablet (50 mg total) by mouth daily as needed for erectile dysfunction.  Dispense: 10 tablet; Refill: 0  4. Pain in left leg Suspicious for tendon rupture-no known etiology,  ?medication induced (I would expect this with Cipro which he has not been on) - AMB referral to orthopedics  5. Neuropathy Uncontrolled Unable to tolerate gabapentin-switch to Cymbalta - DULoxetine (CYMBALTA) 60 MG capsule; Take 1 capsule (60 mg total) by mouth daily.  Dispense: 30 capsule; Refill: 3 - Hemoglobin A1c; Future - Vitamin B12; Future   Follow Up Instructions: 1 month with PCP-Dr. Follow   I discussed the assessment and treatment plan with the patient. The patient was provided an opportunity to ask questions and all were answered. The patient agreed with the plan and demonstrated an understanding of the instructions.   The patient was advised to call back or seek an in-person evaluation if the  symptoms worsen or if the condition fails to improve as anticipated.     I provided 25 minutes total of non-face-to-face time during this encounter including median intraservice time, reviewing previous notes, labs, imaging, medications, management and patient verbalized understanding.     Charlott Rakes, MD, FAAFP. [facility] [place], [place]   01/15/2019, 2:51 PM

## 2019-01-15 NOTE — Progress Notes (Signed)
Patient has been called and DOB has been verified. Patient has been screened and transferred to PCP to start phone visit.    Patient is having pain in his left calf muscle.

## 2019-01-16 ENCOUNTER — Other Ambulatory Visit: Payer: Medicaid Other

## 2019-01-25 ENCOUNTER — Ambulatory Visit: Payer: Medicaid Other | Admitting: Internal Medicine

## 2019-02-14 ENCOUNTER — Other Ambulatory Visit: Payer: Self-pay

## 2019-02-14 ENCOUNTER — Ambulatory Visit: Payer: Medicaid Other | Attending: Family Medicine

## 2019-02-14 DIAGNOSIS — G629 Polyneuropathy, unspecified: Secondary | ICD-10-CM

## 2019-02-15 LAB — HEMOGLOBIN A1C
Est. average glucose Bld gHb Est-mCnc: 111 mg/dL
Hgb A1c MFr Bld: 5.5 % (ref 4.8–5.6)

## 2019-02-15 LAB — VITAMIN B12: Vitamin B-12: 402 pg/mL (ref 232–1245)

## 2019-02-16 ENCOUNTER — Telehealth: Payer: Self-pay

## 2019-02-16 NOTE — Telephone Encounter (Signed)
Patient was called on both phone numbers on file, there is nor voicemail set up to leave a message.

## 2019-02-16 NOTE — Telephone Encounter (Signed)
-----   Message from Hoy Register, MD sent at 02/15/2019  2:09 PM EST ----- Please inform him labs are normal.  He also needs to contact [facility] as they tried to schedule an appointment for him but were unable to reach him.

## 2019-02-22 ENCOUNTER — Ambulatory Visit: Payer: Medicaid Other | Admitting: Family Medicine

## 2019-02-22 ENCOUNTER — Ambulatory Visit: Payer: Medicaid Other | Admitting: Pharmacist

## 2019-02-26 ENCOUNTER — Ambulatory Visit: Payer: Medicaid Other | Admitting: Pharmacist

## 2019-03-08 ENCOUNTER — Other Ambulatory Visit: Payer: Self-pay | Admitting: Family Medicine

## 2019-03-08 DIAGNOSIS — M0089 Polyarthritis due to other bacteria: Secondary | ICD-10-CM

## 2019-03-08 DIAGNOSIS — A498 Other bacterial infections of unspecified site: Secondary | ICD-10-CM

## 2019-03-08 DIAGNOSIS — N528 Other male erectile dysfunction: Secondary | ICD-10-CM

## 2019-03-08 DIAGNOSIS — F419 Anxiety disorder, unspecified: Secondary | ICD-10-CM

## 2019-03-14 ENCOUNTER — Other Ambulatory Visit: Payer: Self-pay

## 2019-03-14 MED ORDER — DOXYCYCLINE HYCLATE 50 MG PO CAPS: 100.0000 mg | ORAL_CAPSULE | Freq: Two times a day (BID) | ORAL | 0 refills | Status: DC

## 2019-03-21 NOTE — Progress Notes (Unsigned)
Hep C visit. Chronic hepatitis C, genotype 3, with a recent viral load of 17,600.  He started on Epclusa x 12 weeks on 10/03/2018. Hx of polysubstance abuse. Currently may be drinking alcohol and using cocaine intermittently.   HCV RNA was undetectable 12/28/18. Should be done with treatment, so testing today for undetectable w/o treatment. Need to get HCV RNA. No CMET needed. May need hep A and B vaccines as well.

## 2019-03-22 ENCOUNTER — Ambulatory Visit: Payer: Medicaid Other | Admitting: Pharmacist

## 2019-10-21 ENCOUNTER — Emergency Department (HOSPITAL_COMMUNITY): Payer: Medicaid Other

## 2019-10-21 ENCOUNTER — Encounter (HOSPITAL_COMMUNITY): Payer: Self-pay | Admitting: Emergency Medicine

## 2019-10-21 ENCOUNTER — Emergency Department (HOSPITAL_COMMUNITY)
Admission: EM | Admit: 2019-10-21 | Discharge: 2019-10-21 | Disposition: A | Discharge status: Home / Self Care | Payer: Medicaid Other | Attending: Emergency Medicine | Admitting: Emergency Medicine

## 2019-10-21 ENCOUNTER — Other Ambulatory Visit: Payer: Self-pay

## 2019-10-21 DIAGNOSIS — M1712 Unilateral primary osteoarthritis, left knee: Secondary | ICD-10-CM | POA: Diagnosis not present

## 2019-10-21 DIAGNOSIS — S79912A Unspecified injury of left hip, initial encounter: Secondary | ICD-10-CM | POA: Diagnosis not present

## 2019-10-21 DIAGNOSIS — S3992XA Unspecified injury of lower back, initial encounter: Secondary | ICD-10-CM | POA: Diagnosis not present

## 2019-10-21 DIAGNOSIS — M8538 Osteitis condensans, other site: Secondary | ICD-10-CM | POA: Diagnosis not present

## 2019-10-21 DIAGNOSIS — S8992XA Unspecified injury of left lower leg, initial encounter: Secondary | ICD-10-CM | POA: Diagnosis not present

## 2019-10-21 DIAGNOSIS — M25562 Pain in left knee: Secondary | ICD-10-CM | POA: Diagnosis not present

## 2019-10-21 DIAGNOSIS — M4319 Spondylolisthesis, multiple sites in spine: Secondary | ICD-10-CM | POA: Diagnosis not present

## 2019-10-21 DIAGNOSIS — Z5321 Procedure and treatment not carried out due to patient leaving prior to being seen by health care provider: Secondary | ICD-10-CM | POA: Diagnosis not present

## 2019-10-21 DIAGNOSIS — M25552 Pain in left hip: Secondary | ICD-10-CM | POA: Diagnosis not present

## 2019-10-21 DIAGNOSIS — Y939 Activity, unspecified: Secondary | ICD-10-CM | POA: Diagnosis not present

## 2019-10-21 DIAGNOSIS — M545 Low back pain: Secondary | ICD-10-CM | POA: Insufficient documentation

## 2019-10-21 DIAGNOSIS — Y92410 Unspecified street and highway as the place of occurrence of the external cause: Secondary | ICD-10-CM | POA: Insufficient documentation

## 2019-10-21 DIAGNOSIS — Y999 Unspecified external cause status: Secondary | ICD-10-CM | POA: Insufficient documentation

## 2019-10-21 DIAGNOSIS — M47817 Spondylosis without myelopathy or radiculopathy, lumbosacral region: Secondary | ICD-10-CM | POA: Diagnosis not present
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
## 2019-10-22 ENCOUNTER — Ambulatory Visit: Payer: Self-pay
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
## 2020-03-07 IMAGING — CT CT OF THE RIGHT SHOULDER WITH CONTRAST
3 series · 15 of 33 positions shown, 18 images · IV contrast (agent unspecified)
Comparison: Right shoulder x-rays dated June 19, 2018.

CONTRAST:  100mL OMNIPAQUE IOHEXOL 300 MG/ML  SOLN

CLINICAL DATA: Worsening right shoulder pain. Recent left knee
septic arthritis.

EXAM:
CT OF THE UPPER RIGHT EXTREMITY WITH CONTRAST
TECHNIQUE: Multidetector CT imaging of the upper right extremity was performed
according to the standard protocol following intravenous contrast
administration.

[Series 3: (id) st ax · axial · 0.45mm/px · z∈[-642,-454]mm · 7 of 112 slices shown, 9 images]
[im 9/112  soft-tissue]
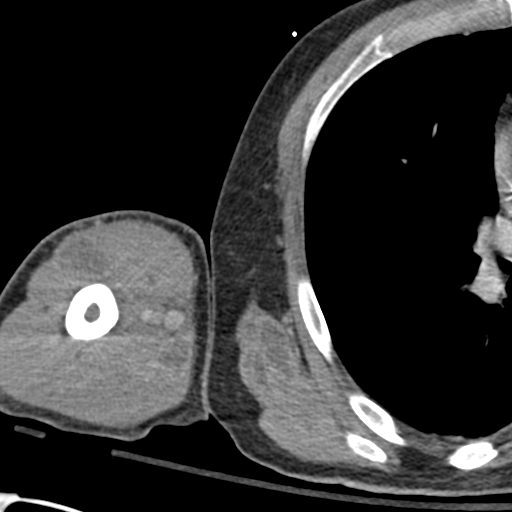
[im 9/112  bone]
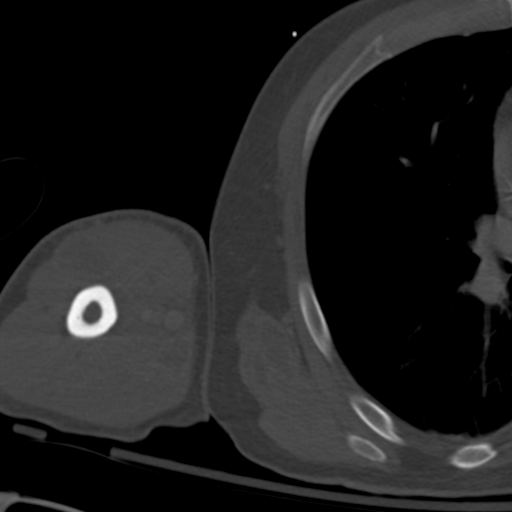
[im 26/112  bone]
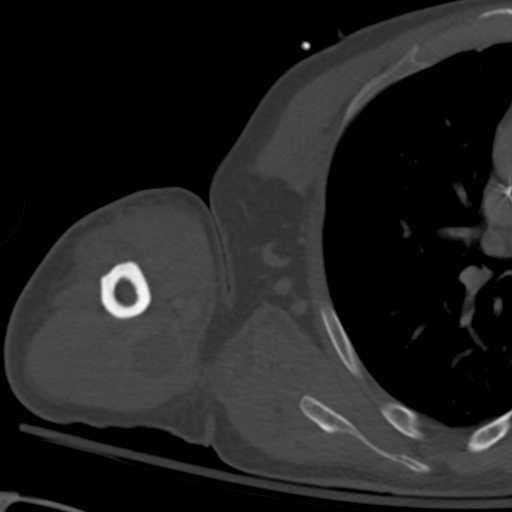
[im 43/112  bone]
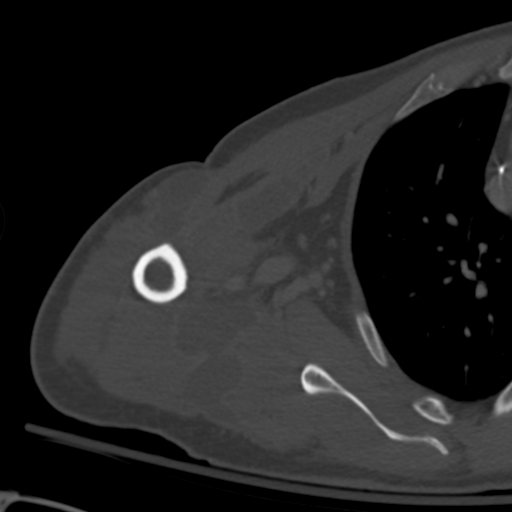
[im 60/112  bone]
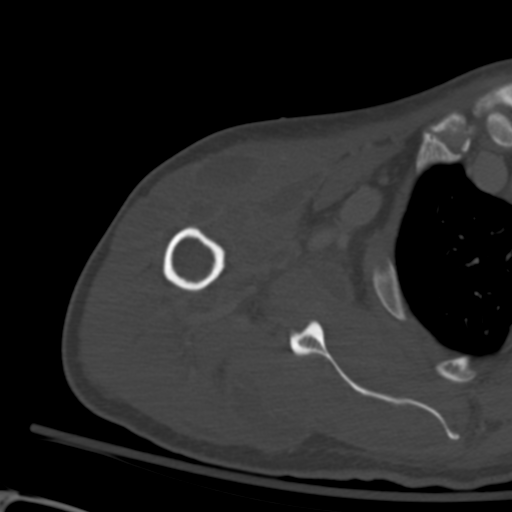
[im 69/112  soft-tissue]
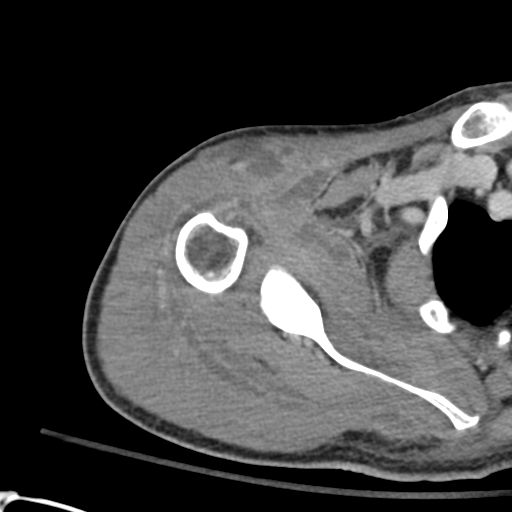
[im 69/112  bone]
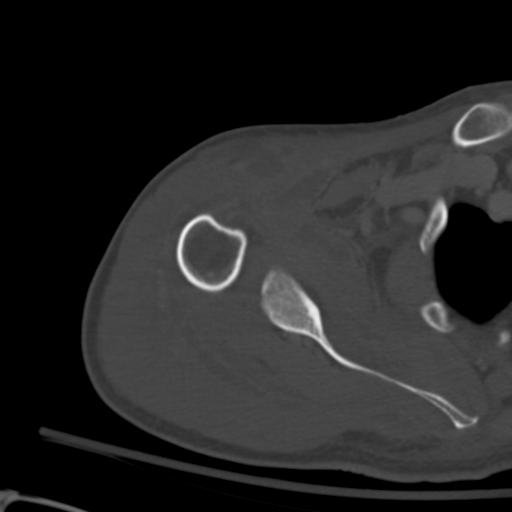
[im 86/112  bone]
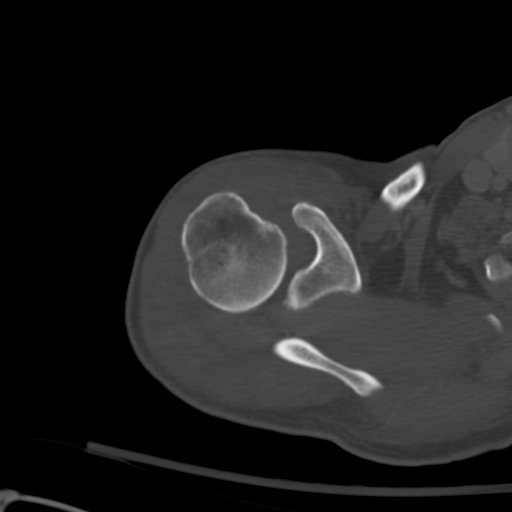
[im 103/112  bone]
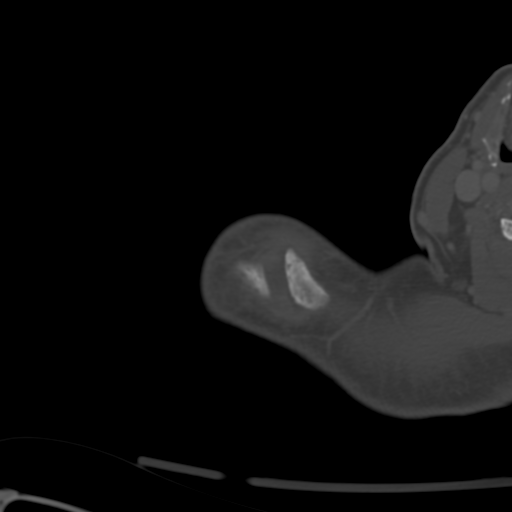

[Series 8: (id) st cor · coronal · 0.45mm/px · 3 of 101 slices shown]
[im 21/101  bone]
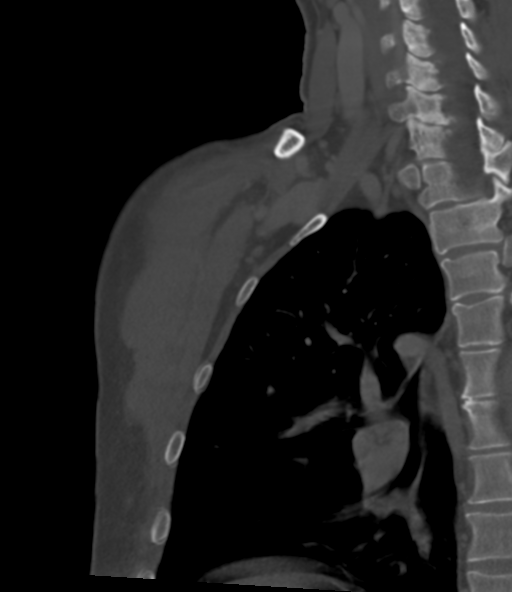
[im 41/101  bone]
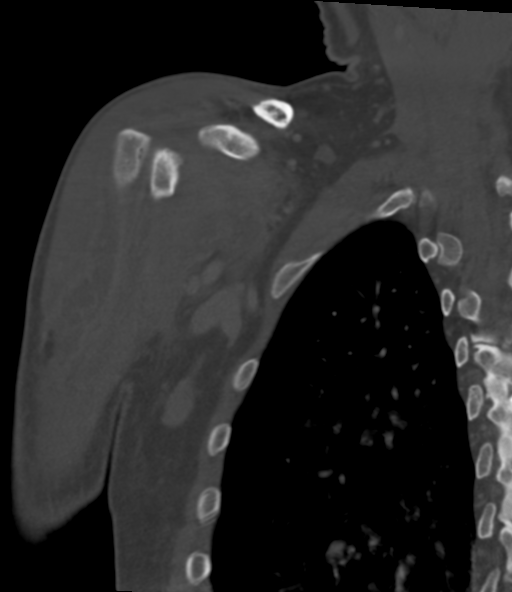
[im 61/101  bone]
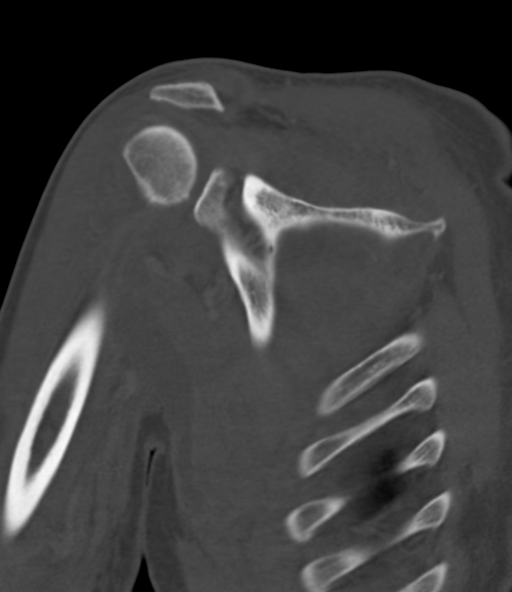

[Series 9: (id) st sag · sagittal · 0.47mm/px · 5 of 101 slices shown, 6 images]
[im 34/101  bone]
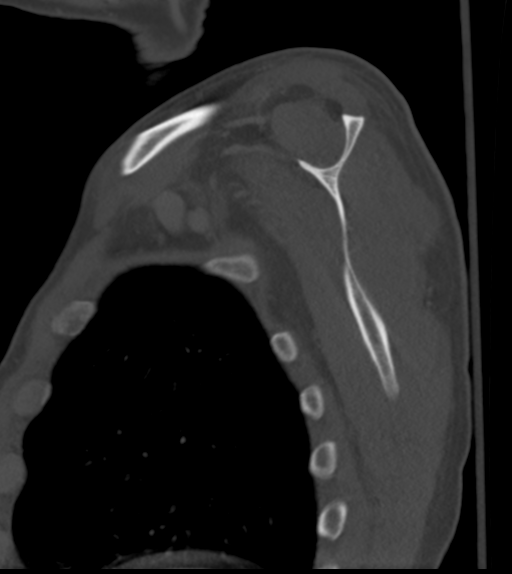
[im 42/101  bone]
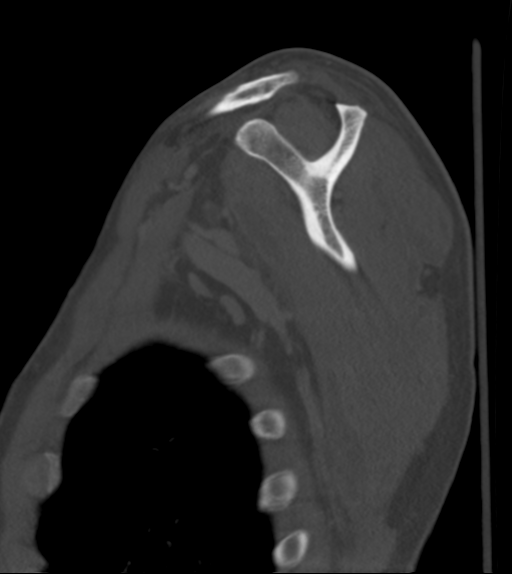
[im 51/101  soft-tissue]
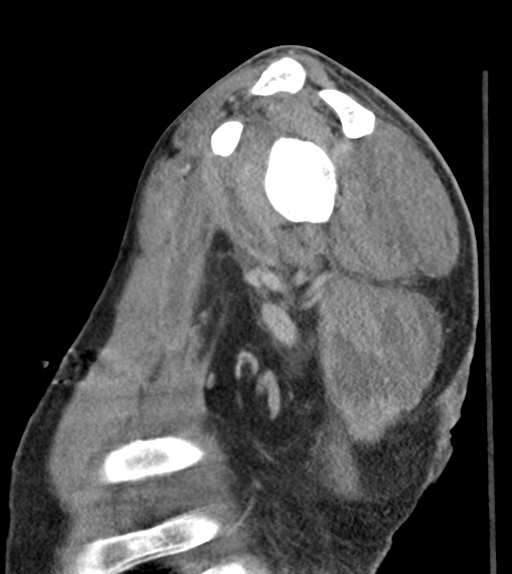
[im 51/101  bone]
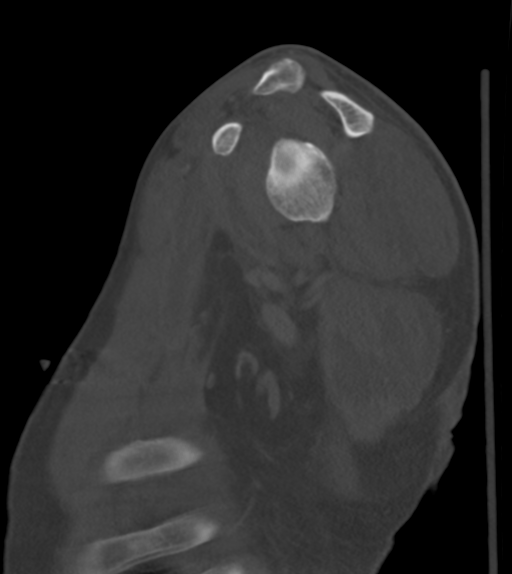
[im 59/101  bone]
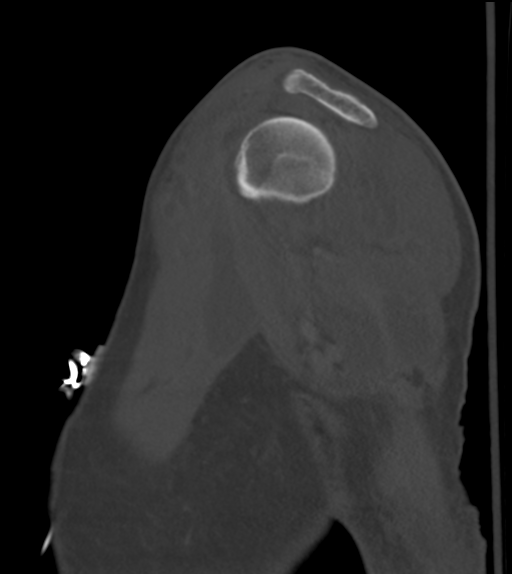
[im 67/101  bone]
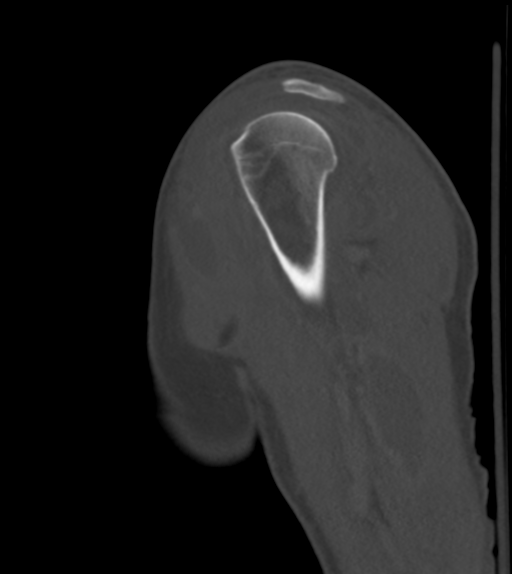

[15 of 33 positions shown; findings below may reference images not displayed]

FINDINGS: Bones/Joint/Cartilage

No acute fracture or dislocation. No osseous destruction or
periosteal reaction. Mild widening of the acromioclavicular joint
with fluid in the joint space. The glenohumeral joint space is
preserved. No significant glenohumeral joint effusion. There is a
small amount of air within the glenohumeral joint space.

Ligaments

Suboptimally assessed by CT.

Muscles and Tendons

There are multiple rim enhancing intramuscular fluid collections
about the right shoulder involving the anterior and posterior
deltoid, pectoralis minor, biceps, triceps, subscapularis, teres
major, and latissimus dorsi muscles. There is also a rim enhancing
interfascial fluid collection in between the posterior deltoid and
infraspinatus muscles. The largest collection involving the triceps,
teres minor, and latissimus dorsi muscles measures approximately
x 5.0 x 10.5 cm (AP by transverse by CC).

Soft tissues

1.3 x 0.7 cm lobulated low-density nodule in the right upper lobe
(series 3, image 74) likely represents mucoid impaction.
IMPRESSION: 1. Multiple, at least 7, intramuscular and interfascial abscesses
about the right shoulder as described above. The largest abscess
involving the triceps, teres minor, and latissimus dorsi muscles
measures 5.3 x 5.0 x 10.5 cm.
2. Widening of the acromioclavicular joint with prominent fluid in
the joint space. Septic arthritis is not excluded. No CT evidence of
osteomyelitis.
3. No significant glenohumeral joint effusion.

## 2020-03-07 IMAGING — MR MRI LUMBAR SPINE WITHOUT CONTRAST
4 of 6 series · 18 of 48 positions shown · non-contrast
Comparison: 06/13/2018 lumbar spine MRI.

CLINICAL DATA: 52 y/o M; history of substance abuse with several
weeks of pain and multiple abscesses.

EXAM:
MRI LUMBAR SPINE WITHOUT CONTRAST
TECHNIQUE: Multiplanar, multisequence MR imaging of the lumbar spine was
performed. No intravenous contrast was administered.

[Series 3: T2 · sagittal · 4.0mm · 0.59mm/px · 6 of 22 slices shown (1 of 2)]
[im 1/22]
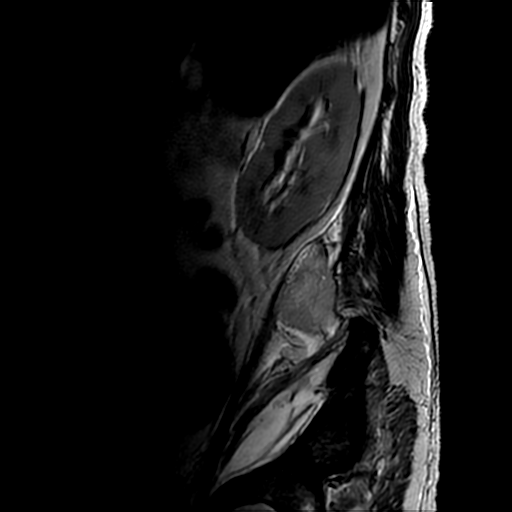
[im 5/22]
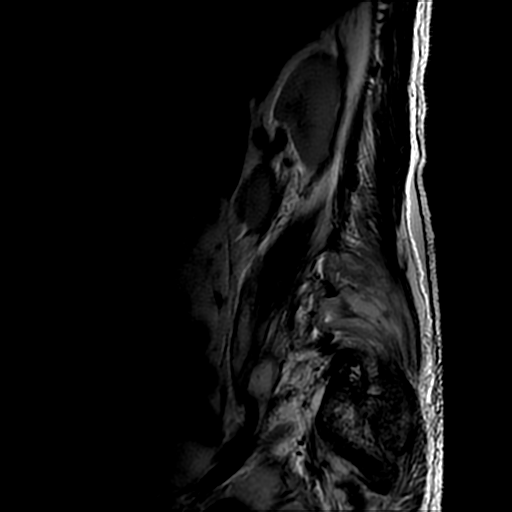
[im 9/22]
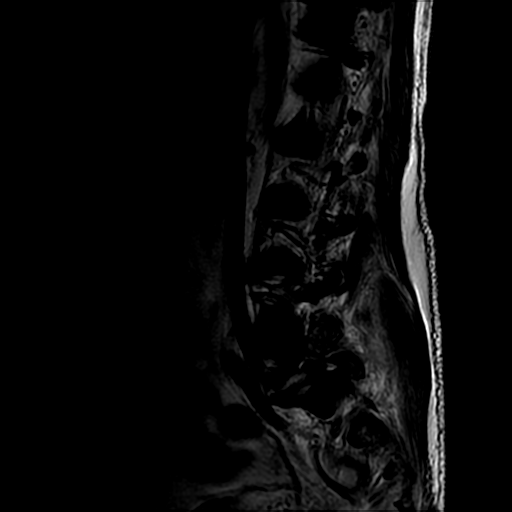
[im 13/22]
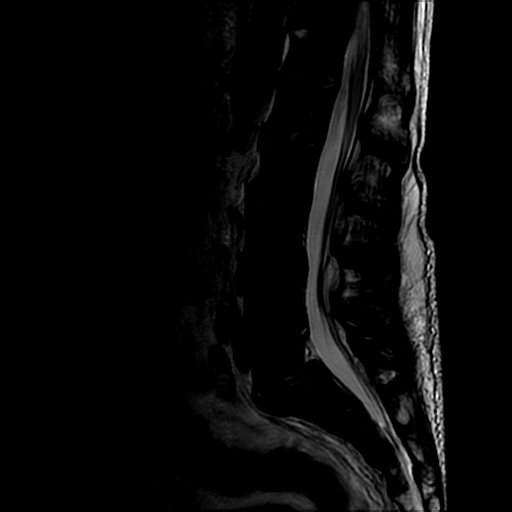
[im 17/22]
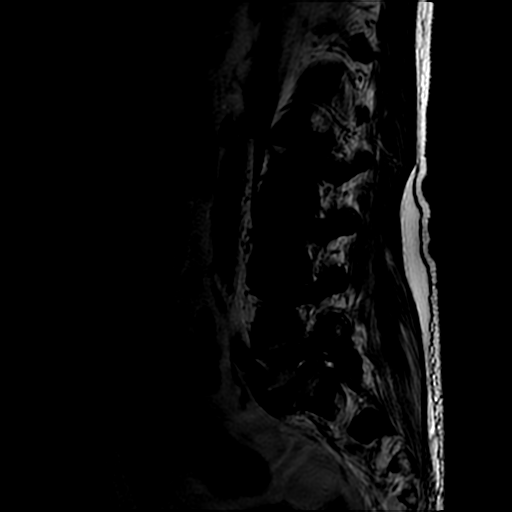
[im 22/22]
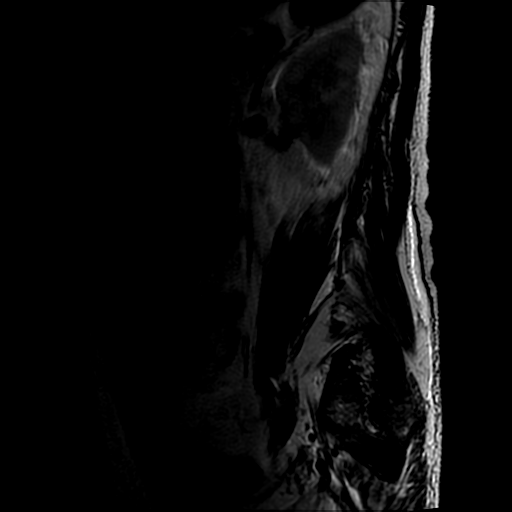

[Series 6: T1 · sagittal · 4.0mm · 0.59mm/px · 3 of 22 slices shown (1 of 2)]
[im 5/22]
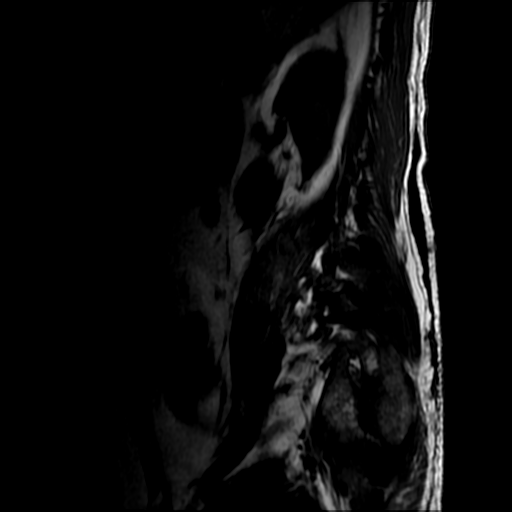
[im 13/22]
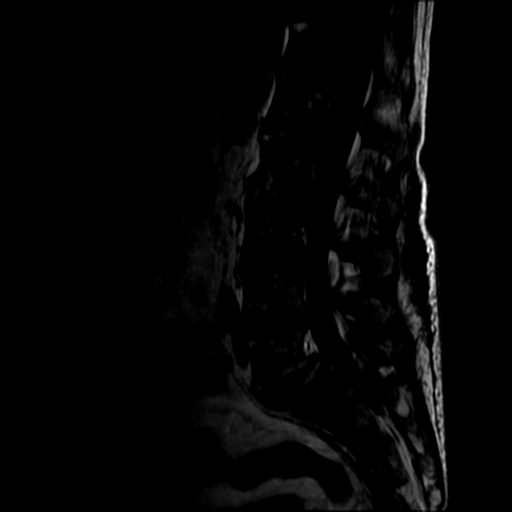
[im 22/22]
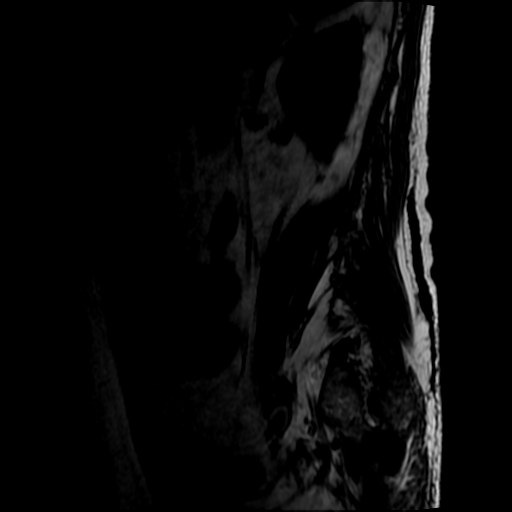

[Series 7: T2 · axial · 4.0mm · 0.43mm/px · z∈[-202,+33]mm · 6 of 52 slices shown (2 of 2)]
[im 1/52]
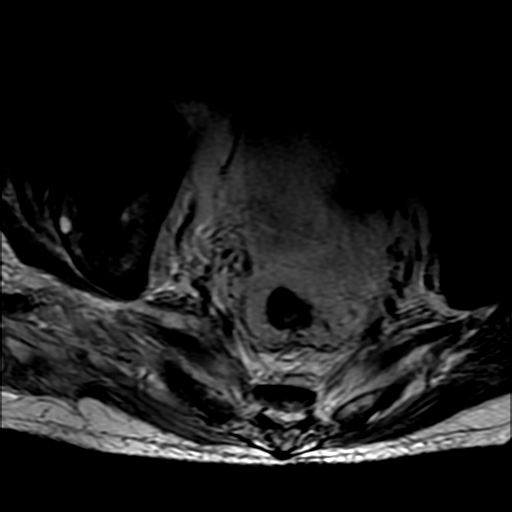
[im 9/52]
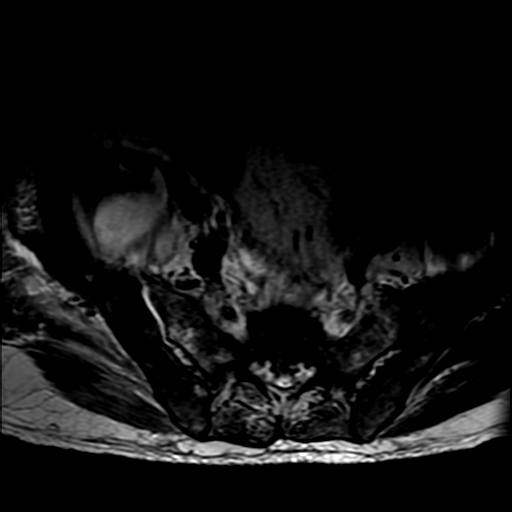
[im 18/52]
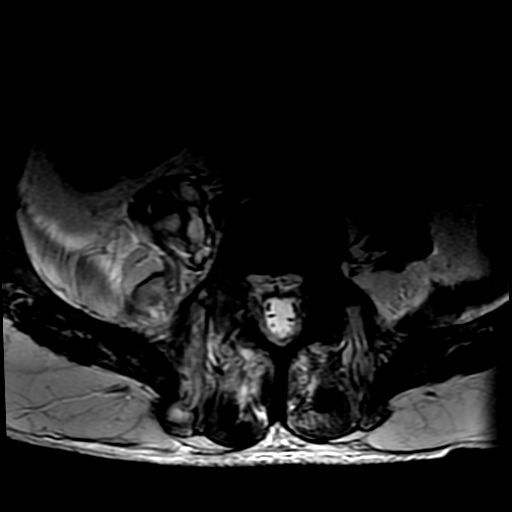
[im 22/52]
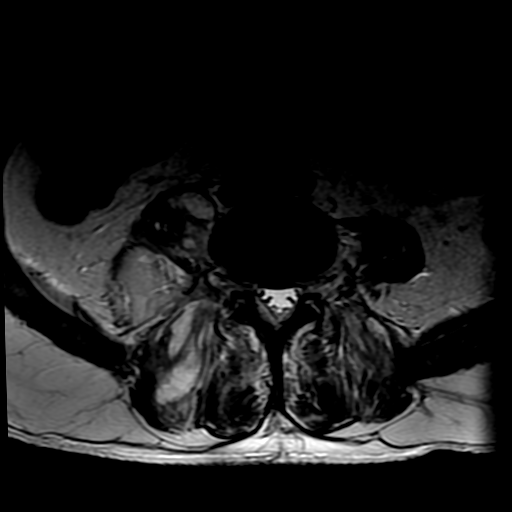
[im 26/52]
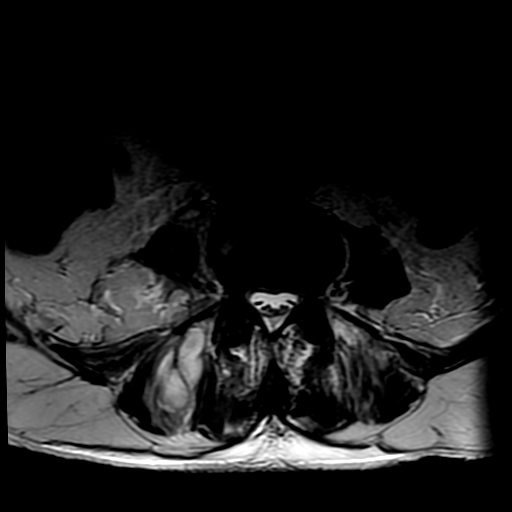
[im 43/52]
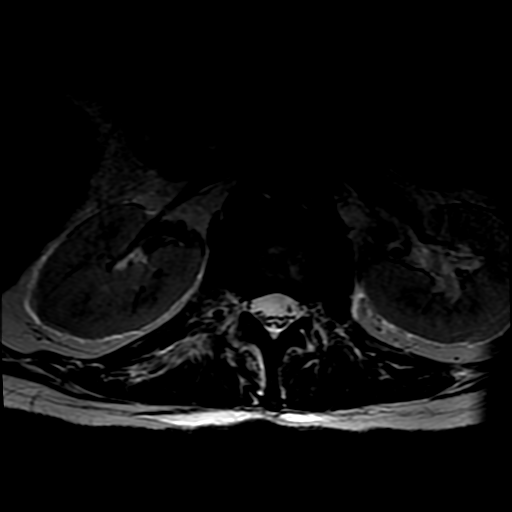

[Series 8: T1 · axial · 4.0mm · 0.43mm/px · z∈[-162,+33]mm · 3 of 52 slices shown (2 of 2)]
[im 9/52]
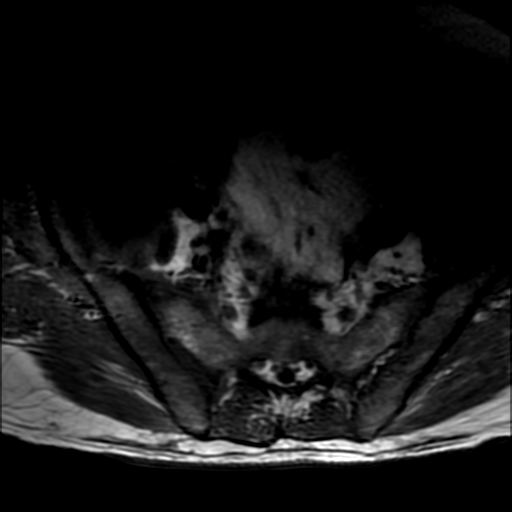
[im 26/52]
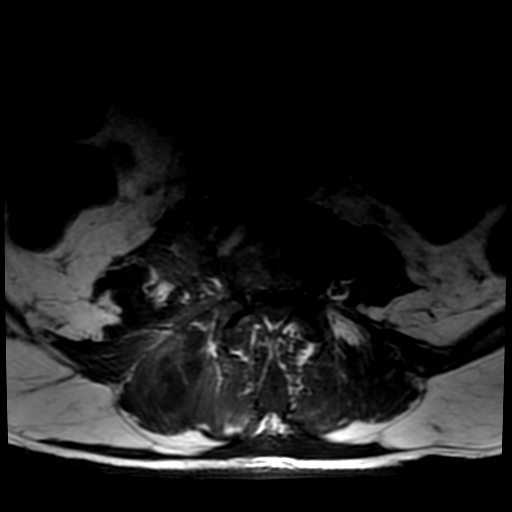
[im 43/52]
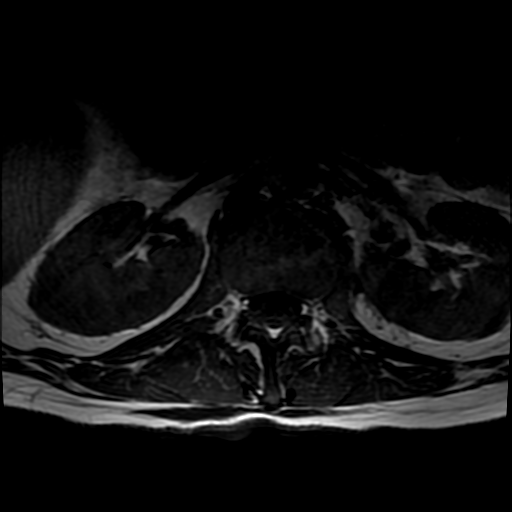

[18 of 48 positions shown; findings below may reference images not displayed]

FINDINGS: Segmentation:  Standard.

Alignment: Stable L5-S1 grade 1 anterolisthesis secondary to chronic
bilateral L5 pars defects.

Vertebrae: There is increased disc signal and opposing endplate
signal the L5-S1 level without vertebral body collapse. There is
edema within the right L4-5 facets and joint effusion. There is a
joint effusion within the right sacroiliac joint and edema in the
opposing iliac bone and sacral ala. There are multiloculated fluid
collections within the right-sided paraspinal muscles from L2-S1,
retroperitoneal space posterior to the posterior renal fascia,,
psoas muscle, iliacus muscle, right lateral pelvic floor muscles,
piriformis muscle, and the right-sided gluteus muscles extending
below the field of view.

Conus medullaris and cauda equina: Conus extends to the L1 level.
Conus and cauda equina appear normal.

Paraspinal and other soft tissues: As above.

Disc levels:

L1-2: No significant disc displacement, foraminal stenosis, or canal
stenosis.

L2-3: No significant disc displacement, foraminal stenosis, or canal
stenosis.

L3-4: Stable mild disc bulge. No significant foraminal or spinal
canal stenosis.

L4-5: Stable mild disc bulge and facet hypertrophy. Interval mild
distention of the right facet synovial capsule narrowing the right
lateral recess and contacting the descending right L5 nerve root. No
significant foraminal or spinal canal stenosis.

L5-S1: Grade 1 anterolisthesis, bilateral pars defects, and facet
hypertrophy are stable resulting in moderate to severe bilateral
foraminal stenosis. No significant spinal canal stenosis.
IMPRESSION: 1. Joint fluid and bone marrow edema compatible with L5-S1 discitis
osteomyelitis, right L4-5 septic arthritis, and right sacroiliac
joint septic arthritis. No bony destructive changes at this time.
2. Multiloculated fluid collections compatible with abscess within
the right-sided paraspinal muscles from L2-S1, right retroperitoneal
space posterior to the posterior renal fascia, right psoas muscle,
right iliacus muscle, right lateral pelvic floor muscles, right
piriformis muscle, and the right-sided gluteus muscles extending
below the field of view.
3. Expansion the right L4-5 facet capsule from joint fluid effaces
the right lateral recess and contacts the descending right L5 nerve
root.
4. Otherwise stable lumbar spondylosis greatest at L5-S1 where grade
1 anterolisthesis contributes to moderate to severe bilateral
foraminal stenosis.

## 2020-05-16 IMAGING — CR PORTABLE CHEST - 1 VIEW
1 series · 2 of 2 positions shown · non-contrast
Comparison: CT 08/01/2018, radiograph 07/30/2018

CLINICAL DATA: Shortness of breath and syncope

EXAM:
PORTABLE CHEST 1 VIEW

[Series 1: portable · 0.17mm/px · 2 of 2 slices shown]
[im 1/2]
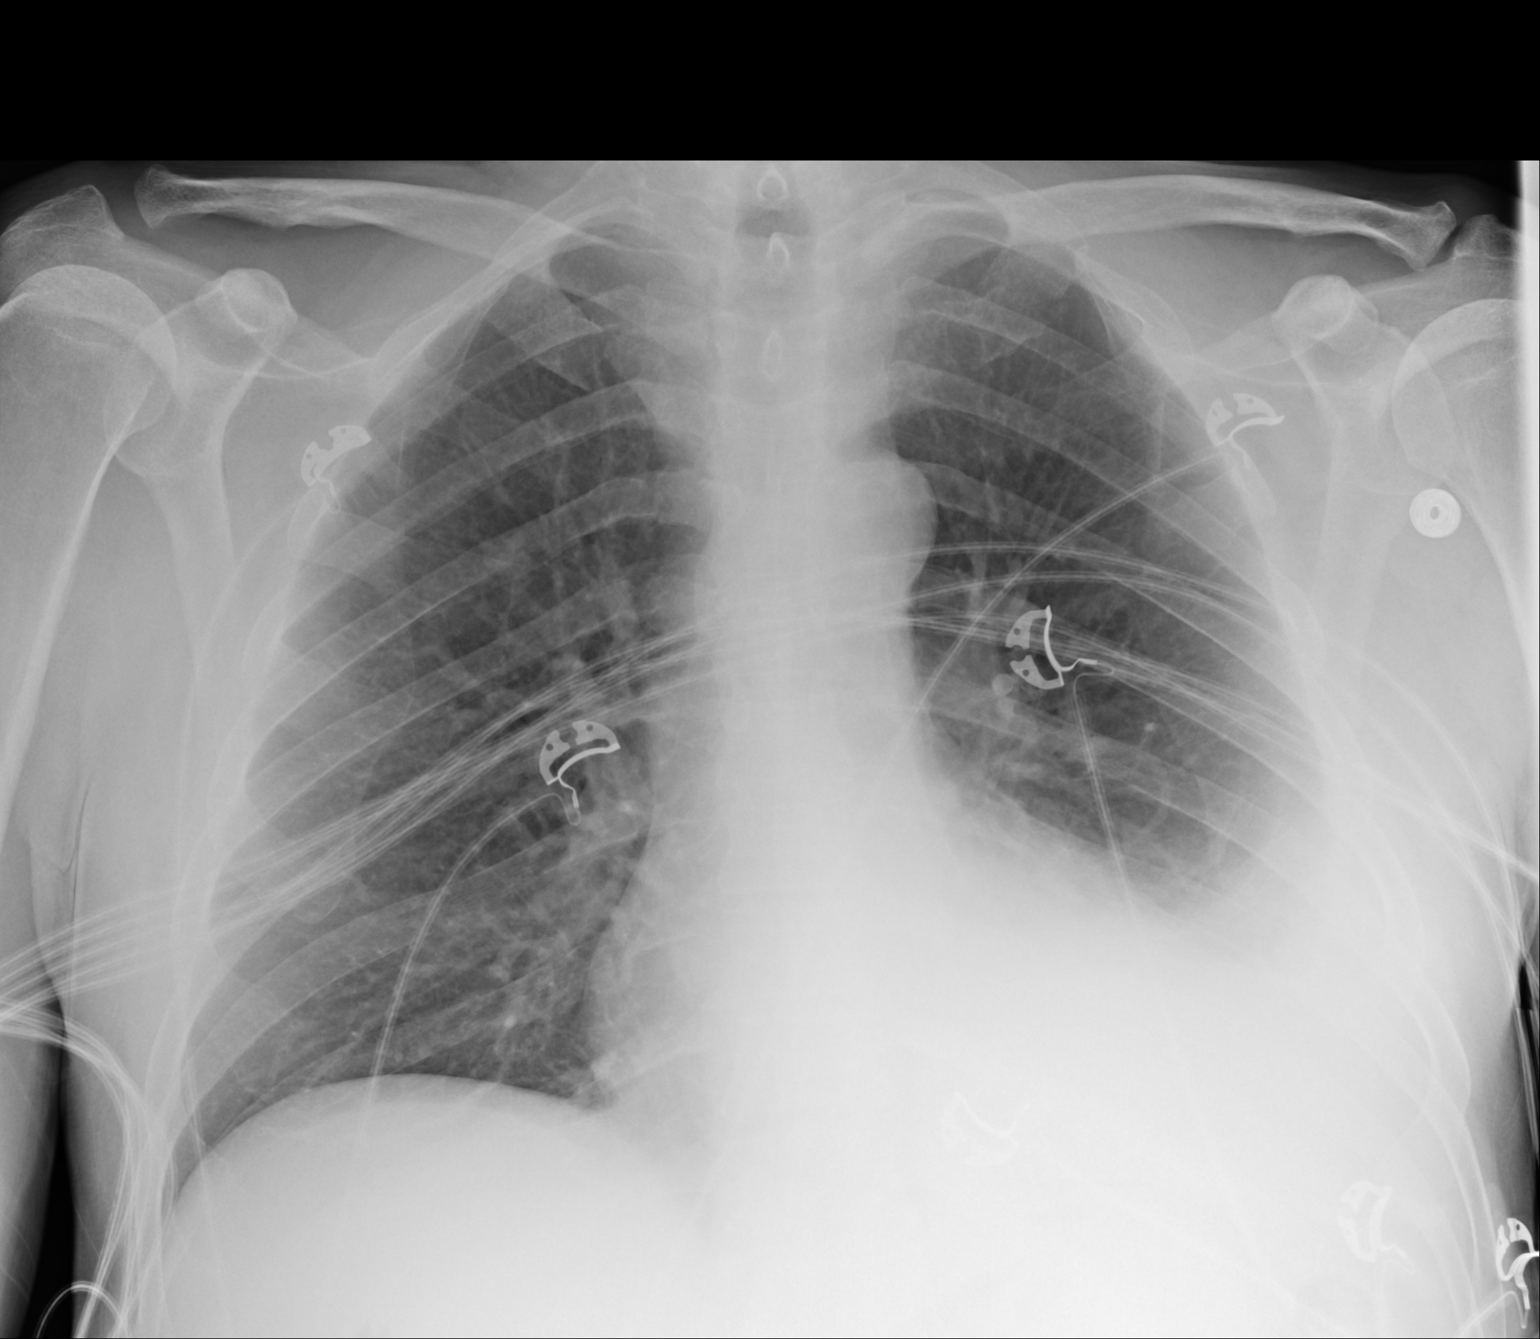
[im 2/2]
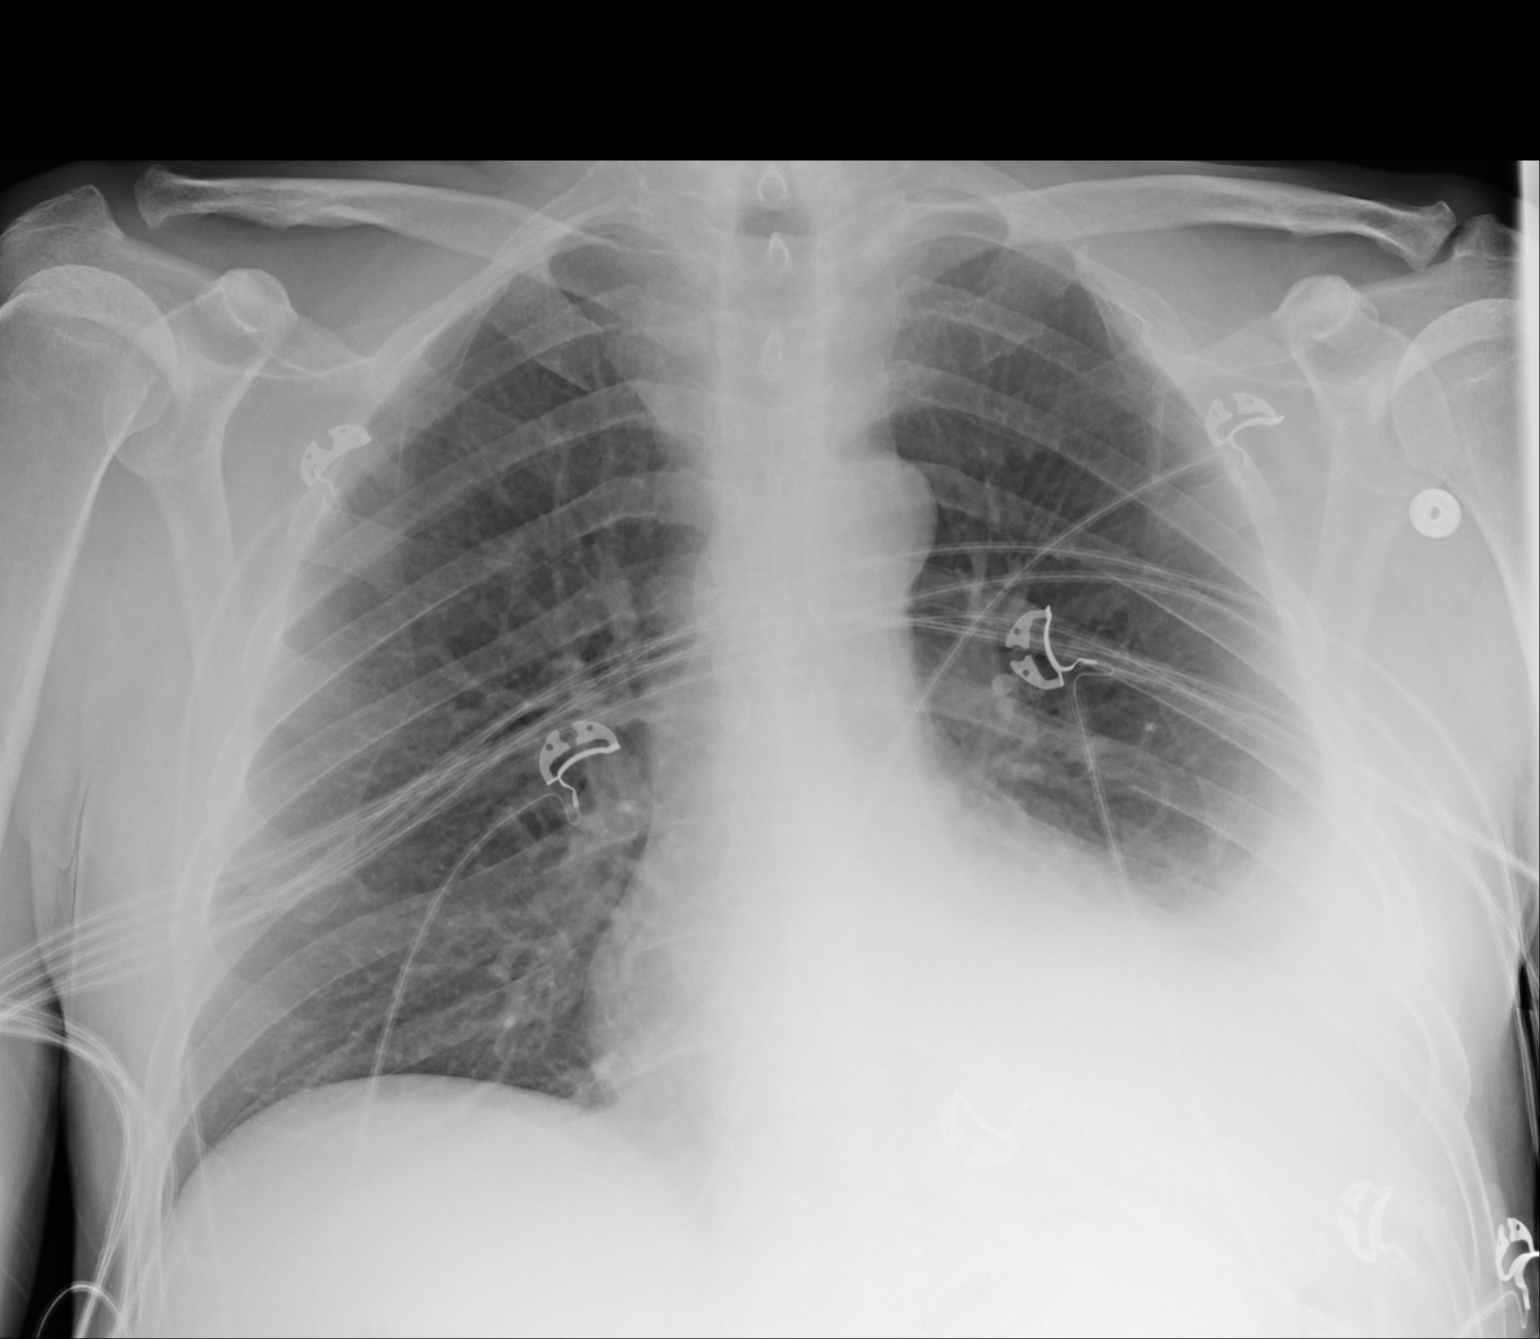

[2 of 2 positions shown; findings below may reference images not displayed]

FINDINGS: Moderate left pleural effusion. Right lung is clear. Consolidation
at the lingula and left base. Stable cardiomediastinal silhouette.
No pneumothorax.
IMPRESSION: Moderate left pleural effusion with atelectasis or pneumonia at the
lingula and left base

## 2021-01-08 DIAGNOSIS — Z419 Encounter for procedure for purposes other than remedying health state, unspecified: Secondary | ICD-10-CM | POA: Diagnosis not present

## 2021-02-08 DIAGNOSIS — Z419 Encounter for procedure for purposes other than remedying health state, unspecified: Secondary | ICD-10-CM | POA: Diagnosis not present

## 2021-03-11 DIAGNOSIS — Z419 Encounter for procedure for purposes other than remedying health state, unspecified: Secondary | ICD-10-CM | POA: Diagnosis not present

## 2021-04-08 DIAGNOSIS — Z419 Encounter for procedure for purposes other than remedying health state, unspecified: Secondary | ICD-10-CM | POA: Diagnosis not present

## 2021-05-09 DIAGNOSIS — Z419 Encounter for procedure for purposes other than remedying health state, unspecified: Secondary | ICD-10-CM | POA: Diagnosis not present

## 2021-06-08 DIAGNOSIS — Z419 Encounter for procedure for purposes other than remedying health state, unspecified: Secondary | ICD-10-CM | POA: Diagnosis not present

## 2021-07-09 DIAGNOSIS — Z419 Encounter for procedure for purposes other than remedying health state, unspecified: Secondary | ICD-10-CM | POA: Diagnosis not present

## 2021-08-08 DIAGNOSIS — Z419 Encounter for procedure for purposes other than remedying health state, unspecified: Secondary | ICD-10-CM | POA: Diagnosis not present

## 2021-09-08 DIAGNOSIS — Z419 Encounter for procedure for purposes other than remedying health state, unspecified: Secondary | ICD-10-CM | POA: Diagnosis not present

## 2021-10-09 DIAGNOSIS — Z419 Encounter for procedure for purposes other than remedying health state, unspecified: Secondary | ICD-10-CM | POA: Diagnosis not present

## 2021-10-13 DIAGNOSIS — Z743 Need for continuous supervision: Secondary | ICD-10-CM | POA: Diagnosis not present

## 2021-10-13 DIAGNOSIS — I517 Cardiomegaly: Secondary | ICD-10-CM | POA: Diagnosis not present

## 2021-10-13 DIAGNOSIS — R55 Syncope and collapse: Secondary | ICD-10-CM | POA: Diagnosis not present

## 2021-10-13 DIAGNOSIS — R0902 Hypoxemia: Secondary | ICD-10-CM | POA: Diagnosis not present

## 2021-10-13 DIAGNOSIS — J9601 Acute respiratory failure with hypoxia: Secondary | ICD-10-CM | POA: Diagnosis not present

## 2021-10-13 DIAGNOSIS — K469 Unspecified abdominal hernia without obstruction or gangrene: Secondary | ICD-10-CM | POA: Diagnosis not present

## 2021-10-13 DIAGNOSIS — R6889 Other general symptoms and signs: Secondary | ICD-10-CM | POA: Diagnosis not present

## 2021-10-13 DIAGNOSIS — T405X1A Poisoning by cocaine, accidental (unintentional), initial encounter: Secondary | ICD-10-CM | POA: Diagnosis not present

## 2021-10-13 DIAGNOSIS — J180 Bronchopneumonia, unspecified organism: Secondary | ICD-10-CM | POA: Diagnosis not present

## 2021-10-13 DIAGNOSIS — T887XXA Unspecified adverse effect of drug or medicament, initial encounter: Secondary | ICD-10-CM | POA: Diagnosis not present

## 2021-10-14 ENCOUNTER — Inpatient Hospital Stay: Admit: 2021-10-14 | Payer: Self-pay

## 2021-10-14 ENCOUNTER — Encounter (HOSPITAL_COMMUNITY): Payer: Self-pay

## 2021-10-14 ENCOUNTER — Inpatient Hospital Stay: Admit: 2021-10-14 | Payer: Medicaid Other | Admitting: Internal Medicine

## 2021-10-14 DIAGNOSIS — G936 Cerebral edema: Secondary | ICD-10-CM | POA: Diagnosis not present

## 2021-10-14 DIAGNOSIS — K469 Unspecified abdominal hernia without obstruction or gangrene: Secondary | ICD-10-CM | POA: Diagnosis not present

## 2021-10-14 DIAGNOSIS — G931 Anoxic brain damage, not elsewhere classified: Secondary | ICD-10-CM | POA: Diagnosis not present

## 2021-10-14 DIAGNOSIS — T40711A Poisoning by cannabis, accidental (unintentional), initial encounter: Secondary | ICD-10-CM | POA: Diagnosis not present

## 2021-10-14 DIAGNOSIS — J690 Pneumonitis due to inhalation of food and vomit: Secondary | ICD-10-CM | POA: Diagnosis not present

## 2021-10-14 DIAGNOSIS — I517 Cardiomegaly: Secondary | ICD-10-CM | POA: Diagnosis not present

## 2021-10-14 DIAGNOSIS — T405X1A Poisoning by cocaine, accidental (unintentional), initial encounter: Secondary | ICD-10-CM | POA: Diagnosis not present

## 2021-10-14 DIAGNOSIS — R7401 Elevation of levels of liver transaminase levels: Secondary | ICD-10-CM | POA: Diagnosis not present

## 2021-10-14 DIAGNOSIS — I82611 Acute embolism and thrombosis of superficial veins of right upper extremity: Secondary | ICD-10-CM | POA: Diagnosis not present

## 2021-10-14 DIAGNOSIS — G928 Other toxic encephalopathy: Secondary | ICD-10-CM | POA: Diagnosis not present

## 2021-10-14 DIAGNOSIS — G929 Unspecified toxic encephalopathy: Secondary | ICD-10-CM | POA: Diagnosis not present

## 2021-10-14 DIAGNOSIS — I21A1 Myocardial infarction type 2: Secondary | ICD-10-CM | POA: Diagnosis not present

## 2021-10-14 DIAGNOSIS — Z9989 Dependence on other enabling machines and devices: Secondary | ICD-10-CM | POA: Diagnosis not present

## 2021-10-14 DIAGNOSIS — A4189 Other specified sepsis: Secondary | ICD-10-CM | POA: Diagnosis not present

## 2021-10-14 DIAGNOSIS — N179 Acute kidney failure, unspecified: Secondary | ICD-10-CM | POA: Diagnosis not present

## 2021-10-14 DIAGNOSIS — A419 Sepsis, unspecified organism: Secondary | ICD-10-CM | POA: Diagnosis not present

## 2021-10-14 DIAGNOSIS — J9601 Acute respiratory failure with hypoxia: Secondary | ICD-10-CM | POA: Diagnosis not present

## 2021-10-14 DIAGNOSIS — R4182 Altered mental status, unspecified: Secondary | ICD-10-CM | POA: Diagnosis not present

## 2021-10-14 DIAGNOSIS — J180 Bronchopneumonia, unspecified organism: Secondary | ICD-10-CM | POA: Diagnosis not present

## 2021-10-14 DIAGNOSIS — T50904A Poisoning by unspecified drugs, medicaments and biological substances, undetermined, initial encounter: Secondary | ICD-10-CM | POA: Diagnosis not present

## 2021-10-15 DIAGNOSIS — J690 Pneumonitis due to inhalation of food and vomit: Secondary | ICD-10-CM | POA: Diagnosis not present

## 2021-10-15 DIAGNOSIS — J9601 Acute respiratory failure with hypoxia: Secondary | ICD-10-CM | POA: Diagnosis not present

## 2021-10-15 DIAGNOSIS — A419 Sepsis, unspecified organism: Secondary | ICD-10-CM | POA: Diagnosis not present

## 2021-10-15 DIAGNOSIS — J90 Pleural effusion, not elsewhere classified: Secondary | ICD-10-CM | POA: Diagnosis not present

## 2021-10-15 DIAGNOSIS — Z4682 Encounter for fitting and adjustment of non-vascular catheter: Secondary | ICD-10-CM | POA: Diagnosis not present

## 2021-10-15 DIAGNOSIS — G936 Cerebral edema: Secondary | ICD-10-CM | POA: Diagnosis not present

## 2021-10-15 DIAGNOSIS — G929 Unspecified toxic encephalopathy: Secondary | ICD-10-CM | POA: Diagnosis not present

## 2021-10-16 DIAGNOSIS — A419 Sepsis, unspecified organism: Secondary | ICD-10-CM | POA: Diagnosis not present

## 2021-10-16 DIAGNOSIS — J690 Pneumonitis due to inhalation of food and vomit: Secondary | ICD-10-CM | POA: Diagnosis not present

## 2021-10-16 DIAGNOSIS — J9601 Acute respiratory failure with hypoxia: Secondary | ICD-10-CM | POA: Diagnosis not present

## 2021-10-16 DIAGNOSIS — G929 Unspecified toxic encephalopathy: Secondary | ICD-10-CM | POA: Diagnosis not present

## 2021-10-17 DIAGNOSIS — J9601 Acute respiratory failure with hypoxia: Secondary | ICD-10-CM | POA: Diagnosis not present

## 2021-10-17 DIAGNOSIS — Z4682 Encounter for fitting and adjustment of non-vascular catheter: Secondary | ICD-10-CM | POA: Diagnosis not present

## 2021-10-17 DIAGNOSIS — G929 Unspecified toxic encephalopathy: Secondary | ICD-10-CM | POA: Diagnosis not present

## 2021-10-17 DIAGNOSIS — A419 Sepsis, unspecified organism: Secondary | ICD-10-CM | POA: Diagnosis not present

## 2021-10-17 DIAGNOSIS — J90 Pleural effusion, not elsewhere classified: Secondary | ICD-10-CM | POA: Diagnosis not present

## 2021-10-17 DIAGNOSIS — J690 Pneumonitis due to inhalation of food and vomit: Secondary | ICD-10-CM | POA: Diagnosis not present

## 2021-10-18 DIAGNOSIS — J90 Pleural effusion, not elsewhere classified: Secondary | ICD-10-CM | POA: Diagnosis not present

## 2021-10-18 DIAGNOSIS — Z4682 Encounter for fitting and adjustment of non-vascular catheter: Secondary | ICD-10-CM | POA: Diagnosis not present

## 2021-10-18 DIAGNOSIS — G929 Unspecified toxic encephalopathy: Secondary | ICD-10-CM | POA: Diagnosis not present

## 2021-10-18 DIAGNOSIS — J690 Pneumonitis due to inhalation of food and vomit: Secondary | ICD-10-CM | POA: Diagnosis not present

## 2021-10-18 DIAGNOSIS — J9601 Acute respiratory failure with hypoxia: Secondary | ICD-10-CM | POA: Diagnosis not present

## 2021-10-18 DIAGNOSIS — A419 Sepsis, unspecified organism: Secondary | ICD-10-CM | POA: Diagnosis not present

## 2021-10-19 DIAGNOSIS — T50904A Poisoning by unspecified drugs, medicaments and biological substances, undetermined, initial encounter: Secondary | ICD-10-CM | POA: Diagnosis not present

## 2021-10-19 DIAGNOSIS — G936 Cerebral edema: Secondary | ICD-10-CM | POA: Diagnosis not present

## 2021-10-19 DIAGNOSIS — A419 Sepsis, unspecified organism: Secondary | ICD-10-CM | POA: Diagnosis not present

## 2021-10-19 DIAGNOSIS — J9601 Acute respiratory failure with hypoxia: Secondary | ICD-10-CM | POA: Diagnosis not present

## 2021-10-19 DIAGNOSIS — G929 Unspecified toxic encephalopathy: Secondary | ICD-10-CM | POA: Diagnosis not present

## 2021-10-19 DIAGNOSIS — J690 Pneumonitis due to inhalation of food and vomit: Secondary | ICD-10-CM | POA: Diagnosis not present

## 2021-10-19 DIAGNOSIS — R2231 Localized swelling, mass and lump, right upper limb: Secondary | ICD-10-CM | POA: Diagnosis not present

## 2021-10-20 DIAGNOSIS — J90 Pleural effusion, not elsewhere classified: Secondary | ICD-10-CM | POA: Diagnosis not present

## 2021-10-20 DIAGNOSIS — J690 Pneumonitis due to inhalation of food and vomit: Secondary | ICD-10-CM | POA: Diagnosis not present

## 2021-10-20 DIAGNOSIS — G936 Cerebral edema: Secondary | ICD-10-CM | POA: Diagnosis not present

## 2021-10-20 DIAGNOSIS — Z4682 Encounter for fitting and adjustment of non-vascular catheter: Secondary | ICD-10-CM | POA: Diagnosis not present

## 2021-10-20 DIAGNOSIS — T50904A Poisoning by unspecified drugs, medicaments and biological substances, undetermined, initial encounter: Secondary | ICD-10-CM | POA: Diagnosis not present

## 2021-10-20 DIAGNOSIS — A419 Sepsis, unspecified organism: Secondary | ICD-10-CM | POA: Diagnosis not present

## 2021-10-20 DIAGNOSIS — G929 Unspecified toxic encephalopathy: Secondary | ICD-10-CM | POA: Diagnosis not present

## 2021-10-20 DIAGNOSIS — J9601 Acute respiratory failure with hypoxia: Secondary | ICD-10-CM | POA: Diagnosis not present

## 2021-10-21 ENCOUNTER — Inpatient Hospital Stay (HOSPITAL_COMMUNITY)
Admission: AD | Admit: 2021-10-21 | Discharge: 2021-11-30 | DRG: 064 | Disposition: A | Discharge status: Home / Self Care | Payer: Medicaid Other | Source: Other Acute Inpatient Hospital | Attending: Internal Medicine | Admitting: Internal Medicine

## 2021-10-21 ENCOUNTER — Inpatient Hospital Stay (HOSPITAL_COMMUNITY): Payer: Medicaid Other

## 2021-10-21 DIAGNOSIS — R197 Diarrhea, unspecified: Secondary | ICD-10-CM | POA: Diagnosis not present

## 2021-10-21 DIAGNOSIS — I6389 Other cerebral infarction: Secondary | ICD-10-CM | POA: Diagnosis not present

## 2021-10-21 DIAGNOSIS — K7689 Other specified diseases of liver: Secondary | ICD-10-CM | POA: Diagnosis present

## 2021-10-21 DIAGNOSIS — F32A Depression, unspecified: Secondary | ICD-10-CM | POA: Diagnosis present

## 2021-10-21 DIAGNOSIS — G259 Extrapyramidal and movement disorder, unspecified: Secondary | ICD-10-CM | POA: Diagnosis present

## 2021-10-21 DIAGNOSIS — I6359 Cerebral infarction due to unspecified occlusion or stenosis of other cerebral artery: Secondary | ICD-10-CM | POA: Diagnosis not present

## 2021-10-21 DIAGNOSIS — G936 Cerebral edema: Secondary | ICD-10-CM | POA: Diagnosis not present

## 2021-10-21 DIAGNOSIS — I63011 Cerebral infarction due to thrombosis of right vertebral artery: Secondary | ICD-10-CM | POA: Diagnosis not present

## 2021-10-21 DIAGNOSIS — E43 Unspecified severe protein-calorie malnutrition: Secondary | ICD-10-CM | POA: Diagnosis present

## 2021-10-21 DIAGNOSIS — F17210 Nicotine dependence, cigarettes, uncomplicated: Secondary | ICD-10-CM | POA: Diagnosis present

## 2021-10-21 DIAGNOSIS — S0990XA Unspecified injury of head, initial encounter: Secondary | ICD-10-CM | POA: Diagnosis not present

## 2021-10-21 DIAGNOSIS — J918 Pleural effusion in other conditions classified elsewhere: Secondary | ICD-10-CM | POA: Diagnosis present

## 2021-10-21 DIAGNOSIS — J9601 Acute respiratory failure with hypoxia: Secondary | ICD-10-CM | POA: Diagnosis not present

## 2021-10-21 DIAGNOSIS — Z9911 Dependence on respirator [ventilator] status: Secondary | ICD-10-CM

## 2021-10-21 DIAGNOSIS — J90 Pleural effusion, not elsewhere classified: Secondary | ICD-10-CM | POA: Diagnosis not present

## 2021-10-21 DIAGNOSIS — D649 Anemia, unspecified: Secondary | ICD-10-CM | POA: Diagnosis not present

## 2021-10-21 DIAGNOSIS — Z23 Encounter for immunization: Secondary | ICD-10-CM | POA: Diagnosis not present

## 2021-10-21 DIAGNOSIS — I82621 Acute embolism and thrombosis of deep veins of right upper extremity: Secondary | ICD-10-CM

## 2021-10-21 DIAGNOSIS — G931 Anoxic brain damage, not elsewhere classified: Secondary | ICD-10-CM | POA: Diagnosis not present

## 2021-10-21 DIAGNOSIS — R509 Fever, unspecified: Secondary | ICD-10-CM

## 2021-10-21 DIAGNOSIS — F1410 Cocaine abuse, uncomplicated: Secondary | ICD-10-CM | POA: Diagnosis not present

## 2021-10-21 DIAGNOSIS — G9340 Encephalopathy, unspecified: Secondary | ICD-10-CM | POA: Diagnosis not present

## 2021-10-21 DIAGNOSIS — J690 Pneumonitis due to inhalation of food and vomit: Secondary | ICD-10-CM | POA: Diagnosis present

## 2021-10-21 DIAGNOSIS — X58XXXA Exposure to other specified factors, initial encounter: Secondary | ICD-10-CM | POA: Diagnosis present

## 2021-10-21 DIAGNOSIS — B1920 Unspecified viral hepatitis C without hepatic coma: Secondary | ICD-10-CM | POA: Diagnosis present

## 2021-10-21 DIAGNOSIS — Z8249 Family history of ischemic heart disease and other diseases of the circulatory system: Secondary | ICD-10-CM

## 2021-10-21 DIAGNOSIS — Z7189 Other specified counseling: Secondary | ICD-10-CM | POA: Diagnosis not present

## 2021-10-21 DIAGNOSIS — Z781 Physical restraint status: Secondary | ICD-10-CM

## 2021-10-21 DIAGNOSIS — J9382 Other air leak: Secondary | ICD-10-CM | POA: Diagnosis not present

## 2021-10-21 DIAGNOSIS — G928 Other toxic encephalopathy: Secondary | ICD-10-CM | POA: Diagnosis not present

## 2021-10-21 DIAGNOSIS — Z8673 Personal history of transient ischemic attack (TIA), and cerebral infarction without residual deficits: Secondary | ICD-10-CM

## 2021-10-21 DIAGNOSIS — Z515 Encounter for palliative care: Secondary | ICD-10-CM

## 2021-10-21 DIAGNOSIS — F1910 Other psychoactive substance abuse, uncomplicated: Secondary | ICD-10-CM | POA: Diagnosis present

## 2021-10-21 DIAGNOSIS — Z62890 Parent-child estrangement NEC: Secondary | ICD-10-CM

## 2021-10-21 DIAGNOSIS — D72829 Elevated white blood cell count, unspecified: Secondary | ICD-10-CM | POA: Diagnosis not present

## 2021-10-21 DIAGNOSIS — Z6823 Body mass index (BMI) 23.0-23.9, adult: Secondary | ICD-10-CM

## 2021-10-21 DIAGNOSIS — F410 Panic disorder [episodic paroxysmal anxiety] without agoraphobia: Secondary | ICD-10-CM | POA: Diagnosis present

## 2021-10-21 DIAGNOSIS — R7401 Elevation of levels of liver transaminase levels: Secondary | ICD-10-CM | POA: Diagnosis present

## 2021-10-21 DIAGNOSIS — R0989 Other specified symptoms and signs involving the circulatory and respiratory systems: Secondary | ICD-10-CM | POA: Diagnosis not present

## 2021-10-21 DIAGNOSIS — T50904A Poisoning by unspecified drugs, medicaments and biological substances, undetermined, initial encounter: Secondary | ICD-10-CM | POA: Diagnosis not present

## 2021-10-21 DIAGNOSIS — J9811 Atelectasis: Secondary | ICD-10-CM | POA: Diagnosis not present

## 2021-10-21 DIAGNOSIS — R9389 Abnormal findings on diagnostic imaging of other specified body structures: Secondary | ICD-10-CM | POA: Diagnosis not present

## 2021-10-21 DIAGNOSIS — K219 Gastro-esophageal reflux disease without esophagitis: Secondary | ICD-10-CM | POA: Diagnosis present

## 2021-10-21 DIAGNOSIS — Z79899 Other long term (current) drug therapy: Secondary | ICD-10-CM | POA: Diagnosis not present

## 2021-10-21 DIAGNOSIS — Z419 Encounter for procedure for purposes other than remedying health state, unspecified: Secondary | ICD-10-CM | POA: Diagnosis not present

## 2021-10-21 DIAGNOSIS — R9401 Abnormal electroencephalogram [EEG]: Secondary | ICD-10-CM | POA: Diagnosis not present

## 2021-10-21 DIAGNOSIS — R4182 Altered mental status, unspecified: Secondary | ICD-10-CM | POA: Diagnosis not present

## 2021-10-21 DIAGNOSIS — I82409 Acute embolism and thrombosis of unspecified deep veins of unspecified lower extremity: Secondary | ICD-10-CM

## 2021-10-21 DIAGNOSIS — F6381 Intermittent explosive disorder: Secondary | ICD-10-CM | POA: Diagnosis not present

## 2021-10-21 DIAGNOSIS — K449 Diaphragmatic hernia without obstruction or gangrene: Secondary | ICD-10-CM | POA: Diagnosis present

## 2021-10-21 DIAGNOSIS — Z833 Family history of diabetes mellitus: Secondary | ICD-10-CM

## 2021-10-21 DIAGNOSIS — Z9181 History of falling: Secondary | ICD-10-CM

## 2021-10-21 DIAGNOSIS — R1312 Dysphagia, oropharyngeal phase: Secondary | ICD-10-CM | POA: Diagnosis not present

## 2021-10-21 DIAGNOSIS — I639 Cerebral infarction, unspecified: Secondary | ICD-10-CM

## 2021-10-21 DIAGNOSIS — I82611 Acute embolism and thrombosis of superficial veins of right upper extremity: Secondary | ICD-10-CM | POA: Diagnosis present

## 2021-10-21 DIAGNOSIS — R000 Tachycardia, unspecified: Secondary | ICD-10-CM | POA: Diagnosis present

## 2021-10-21 DIAGNOSIS — D75839 Thrombocytosis, unspecified: Secondary | ICD-10-CM | POA: Diagnosis not present

## 2021-10-21 DIAGNOSIS — J9690 Respiratory failure, unspecified, unspecified whether with hypoxia or hypercapnia: Secondary | ICD-10-CM | POA: Diagnosis not present

## 2021-10-21 DIAGNOSIS — I82401 Acute embolism and thrombosis of unspecified deep veins of right lower extremity: Secondary | ICD-10-CM | POA: Diagnosis not present

## 2021-10-21 LAB — GLUCOSE, CAPILLARY: Glucose-Capillary: 123 mg/dL — ABNORMAL HIGH (ref 70–99)

## 2021-10-21 LAB — POCT I-STAT 7, (LYTES, BLD GAS, ICA,H+H)
Acid-Base Excess: 2 mmol/L (ref 0.0–2.0)
Bicarbonate: 26.1 mmol/L (ref 20.0–28.0)
Calcium, Ion: 1.18 mmol/L (ref 1.15–1.40)
HCT: 33 % — ABNORMAL LOW (ref 39.0–52.0)
Hemoglobin: 11.2 g/dL — ABNORMAL LOW (ref 13.0–17.0)
O2 Saturation: 96 %
Patient temperature: 100.1
Potassium: 3.9 mmol/L (ref 3.5–5.1)
Sodium: 141 mmol/L (ref 135–145)
TCO2: 27 mmol/L (ref 22–32)
pCO2 arterial: 38.3 mmHg (ref 32–48)
pH, Arterial: 7.444 (ref 7.35–7.45)
pO2, Arterial: 85 mmHg (ref 83–108)

## 2021-10-21 MED ORDER — ORAL CARE MOUTH RINSE: 15.0000 mL | OROMUCOSAL | Status: DC | PRN

## 2021-10-21 MED ORDER — FENTANYL CITRATE PF 50 MCG/ML IJ SOSY
50.0000 ug | PREFILLED_SYRINGE | INTRAMUSCULAR | Status: DC | PRN
  Administered 2021-10-22 – 2021-10-23 (×2): 100 ug via INTRAVENOUS
  Administered 2021-10-25 – 2021-10-26 (×4): 50 ug via INTRAVENOUS
  Filled 2021-10-21: qty 2
  Filled 2021-10-21: qty 1
  Filled 2021-10-21: qty 2
  Filled 2021-10-21 (×3): qty 1

## 2021-10-21 MED ORDER — ORAL CARE MOUTH RINSE
15.0000 mL | OROMUCOSAL | Status: DC
  Administered 2021-10-21 – 2021-10-26 (×54): 15 mL via OROMUCOSAL

## 2021-10-21 MED ORDER — DOCUSATE SODIUM 50 MG/5ML PO LIQD: 100.0000 mg | Freq: Two times a day (BID) | ORAL | Status: DC

## 2021-10-21 MED ORDER — POLYETHYLENE GLYCOL 3350 17 G PO PACK: 17.0000 g | PACK | Freq: Every day | ORAL | Status: DC

## 2021-10-21 MED ORDER — FENTANYL CITRATE PF 50 MCG/ML IJ SOSY
50.0000 ug | PREFILLED_SYRINGE | INTRAMUSCULAR | Status: AC | PRN
  Administered 2021-10-24 (×3): 50 ug via INTRAVENOUS
  Filled 2021-10-21 (×3): qty 1

## 2021-10-21 MED ORDER — CHLORHEXIDINE GLUCONATE CLOTH 2 % EX PADS
6.0000 | MEDICATED_PAD | Freq: Every day | CUTANEOUS | Status: DC
  Administered 2021-10-21 – 2021-11-01 (×10): 6 via TOPICAL

## 2021-10-21 MED ORDER — DEXMEDETOMIDINE HCL IN NACL 400 MCG/100ML IV SOLN
0.0000 ug/kg/h | INTRAVENOUS | Status: DC
  Filled 2021-10-21: qty 100

## 2021-10-21 NOTE — Progress Notes (Signed)
eLink Physician-Brief Progress Note Patient Name: Lord Lancour DOB: 1966/01/15 MRN: [id number]   Date of Service  10/21/2021  HPI/Events of Note  56/M transferred from an outside facility for MRI.  He was initially brought in after being found unresponsive on 10/14/21.  There was concern for cerebral edema on CT.  Pt has remained intubated and does not have purposeful movements.  Course complicated with aspiration pneumonia and thrombus in the mid and distal basilic vein.   BP 119/83, HR 91, RR 18, O2 sats 97%. Pt is intubated and sedated.   ABG 7.44/38.3/85 on 30% FiO2, PEEP 5, 18, 550  eICU Interventions  MRI as ordered.  Continue with supportive care.  Keep on current vent settings.  Pt is on ceftriaxone.  Continue full dose lovenox.  Start on protonix for GI prophylaxis.       Intervention Category Evaluation Type: New Patient Evaluation  Larinda Buttery 10/21/2021, 11:41 PM

## 2021-10-21 NOTE — Progress Notes (Incomplete)
eLink Physician-Brief Progress Note Patient Name: Detrell Umscheid DOB: 1965/10/07 MRN: [id number]   Date of Service  10/21/2021  HPI/Events of Note  56/M transferred from an outside facility for MRI.    BP 119/83, HR 91, RR 18, O2 sats 97%. Pt is intubated and sedated.   ABG 7.44/38.3/85 on 30% FiO2, PEEP 5, 18, 550  eICU Interventions       Intervention Category Evaluation Type: New Patient Evaluation  Larinda Buttery 10/21/2021, 11:41 PM

## 2021-10-21 NOTE — H&P (Signed)
NAME:  [name], MRN:  [id number], DOB:  Aug 16, 1965, LOS: 0 ADMISSION DATE:  10/21/2021, CONSULTATION DATE: 9/13 REFERRING MD:  Dr. Ilona Sorrel health, CHIEF COMPLAINT: Anoxic injury  History of Present Illness:  56 year old male with past medical history significant for polysubstance abuse, hepatitis C, and GERD who presented to [facility] health in [place] on 9/6 after being found down.  He was last known well at approximately 1300 hrs. that day, however, EMS was called for wellness check at some time later on.  He was found to be down and unresponsive.  Upon EMS evaluation the patient was apneic with a thready pulse.  He had no response to intranasal Narcan.  He was bagged in route to the emergency department at which he arrived around 2300 hrs.  He was intubated immediately upon arrival to the ED.  CT of the head demonstrated cerebral edema without loss of white matter differentiation.  Repeat CT on 9/7 was read as normal by radiology, however, the neurologist still remained concerned for anoxic injury and recommended transfer to tertiary center for MRI, as the sending facility does not have ventilators or IV pumps compatible with MRI.  The patient was unable to transfer due to lack of available ICU beds.  The patient had been unable to wean from sedation due to significant agitation.  He would have nonpurposeful movement in the left upper and bilateral lower extremities.  No movement has been seen in his right upper extremity since presentation.  On 9/11 he was noted to have upper extremity edema on the right.  Ultrasound demonstrated acute thrombus in the mid and distal basilic vein.  Course also complicated by aspiration pneumonia for which she has completed a course of antibiotics   Pertinent  Medical History   has a past medical history of Acid reflux, Head injury, Hepatitis C, and Hiatal hernia.   Significant Hospital Events: Including procedures, antibiotic start and stop dates  in addition to other pertinent events   9/6 found down > present to [facility] ED. CT with bilateral cerebellar edema. 9/7 repeat CT normal Unable to be weaned from sedation 9/11 RUE DVT started on tx dose lovenox 9/13 transfer to [facility] for MRI  Interim History / Subjective:    Objective   Temperature 100.1 F (37.8 C), temperature source Oral, weight 80.9 kg.       No intake or output data in the 24 hours ending 10/21/21 2313 Filed Weights   10/21/21 2300  Weight: 80.9 kg    Examination: General: Thin middle aged male in NAD on vent.  HENT: [place]/AT, PERRL no JVD Lungs: Clear bilateral breath sounds Cardiovascular: RRR, no MRG Abdomen: Soft, non-tender, non-distended Extremities: No acute deformity or ROM limitation Neuro: Extension of bilateral lower extremities to painful stimuli. No eye opening. No verbal response. Does not follow commands  Echo from outside hospital showed LVEF 65% and no wall motion abnormality.   Resolved Hospital Problem list   Elevated troponin  Assessment & Plan:   Anoxic brain injury, vs metabolic encephloapthy, vs PRES: patient found down, apneic. Initial CT with vasogenic edema in bilateral cerebellar hemispheres, and was read as normal the following day (discs on chart). Exam has not significant improved. Patient transferred for MRI. Agitated at times which has limited weaning.  - Supportive care and neuro-protective measures in the ICU.  - MRI brain will be needed. Patient reportedly has "metal plates" in his head and face, but daughter has no additional details. Defer to day  team.  - Spot EEG at OSH without epileptiform activity.  - Will reportedly become very agitated when sedation weaned. Will observe off sedation for now. Precedex infusion if needed for RASS goal 0 to -1.  - Continue Zyprexa 5mg    Acute hypoxemic respiratory failure - Full vent support - SAT/SBT daily - Check CXR and ABG - Consider early trach  DVT: diagnosed 9/11 with R  basilic vein DVT - Continue tx dose lovenox.   Aspiration pneumonia: completed course of CTX and azithro.  Fever, leukocytosis:  worse 9/11 with elevated procalcitonin. Rocephin restarted. Sputum culture with normal flora.  - Continue rocephin for 7 day course - Check blood cx and UA - Consider tap right pleural effusion.  - May need to pursue CNS infection workup. Daughter reports patient uses crack/cocaine. No known history of IV drug use.   Nutrition: - Consult dietician for enteral nutrition guidance.     Best Practice (right click and "Reselect all SmartList Selections" daily)   Diet/type: tubefeeds DVT prophylaxis: systemic dose LMWH GI prophylaxis: PPI Lines: N/A Foley:  Yes, and it is still needed Code Status:  full code Last date of multidisciplinary goals of care discussion [ Daughter 11/11 updated via phone]  Labs   CBC: No results for input(s): "WBC", "NEUTROABS", "HGB", "HCT", "MCV", "PLT" in the last 168 hours.  Basic Metabolic Panel: No results for input(s): "NA", "K", "CL", "CO2", "GLUCOSE", "BUN", "CREATININE", "CALCIUM", "MG", "PHOS" in the last 168 hours. GFR: CrCl cannot be calculated (Patient's most recent lab result is older than the maximum 21 days allowed.). No results for input(s): "PROCALCITON", "WBC", "LATICACIDVEN" in the last 168 hours.  Liver Function Tests: No results for input(s): "AST", "ALT", "ALKPHOS", "BILITOT", "PROT", "ALBUMIN" in the last 168 hours. No results for input(s): "LIPASE", "AMYLASE" in the last 168 hours. No results for input(s): "AMMONIA" in the last 168 hours.  ABG No results found for: "PHART", "PCO2ART", "PO2ART", "HCO3", "TCO2", "ACIDBASEDEF", "O2SAT"   Coagulation Profile: No results for input(s): "INR", "PROTIME" in the last 168 hours.  Cardiac Enzymes: No results for input(s): "CKTOTAL", "CKMB", "CKMBINDEX", "TROPONINI" in the last 168 hours.  HbA1C: Hgb A1c MFr Bld  Date/Time Value Ref Range Status   02/14/2019 09:11 AM 5.5 4.8 - 5.6 % Final    Comment:             Prediabetes: 5.7 - 6.4          Diabetes: >6.4          Glycemic control for adults with diabetes: <7.0   12/17/2013 10:04 AM 5.7 (H) <5.7 % Final    Comment:                                                                           According to the ADA Clinical Practice Recommendations for 2011, when HbA1c is used as a screening test:     >=6.5%   Diagnostic of Diabetes Mellitus            (if abnormal result is confirmed)   5.7-6.4%   Increased risk of developing Diabetes Mellitus   References:Diagnosis and Classification of Diabetes Mellitus,Diabetes Care,2011,34(Suppl 1):S62-S69 and Standards of Medical Care in  Diabetes - 2011,Diabetes Care,2011,34 (Suppl 1):S11-S61.       CBG: No results for input(s): "GLUCAP" in the last 168 hours.  Review of Systems:   Patient is encephalopathic and/or intubated. Therefore history has been obtained from chart review.    Past Medical History:  He,  has a past medical history of Acid reflux, Head injury, Hepatitis C, and Hiatal hernia.   Surgical History:   Past Surgical History:  Procedure Laterality Date   ACROMIO-CLAVICULAR JOINT REPAIR Right 06/28/2018   Procedure: ACROMIO-CLAVICULAR JOINT IRRIGATION AND DEBRIDEMENT;  Surgeon: Cammy Copa, MD;  Location: [facility] OR;  Service: Orthopedics;  Laterality: Right;   FACIAL FRACTURE SURGERY     IRRIGATION AND DEBRIDEMENT SHOULDER Right 06/28/2018   Procedure: IRRIGATION AND DEBRIDEMENT SHOULDER;  Surgeon: Cammy Copa, MD;  Location: [facility] OR;  Service: Orthopedics;  Laterality: Right;   KNEE ARTHROSCOPY Left 06/23/2018   Procedure: LEFT KNEE ARTHROSCOPY KNEE I&D.;  Surgeon: Cammy Copa, MD;  Location: [facility] OR;  Service: Orthopedics;  Laterality: Left;     Social History:   reports that he has been smoking cigarettes. He has a 20.00 pack-year smoking history. He has never used smokeless tobacco. He  reports current alcohol use. He reports that he does not currently use drugs after having used the following drugs: Cocaine.   Family History:  His family history includes Diabetes in his mother; Hypertension in his father and mother.   Allergies Not on File   Home Medications  Prior to Admission medications   Medication Sig Start Date End Date Taking? Authorizing Provider  cyclobenzaprine (FLEXERIL) 10 MG tablet Take 1 tablet (10 mg total) by mouth 3 (three) times daily as needed for muscle spasms. 01/15/19   Hoy Register, MD  diazepam (VALIUM) 5 MG tablet Take 1 tablet (5 mg total) by mouth every 8 (eight) hours as needed for anxiety (vertigo). Patient not taking: Reported on 01/15/2019 08/10/18   Alwyn Ren, MD  doxycycline (VIBRAMYCIN) 50 MG capsule Take 2 capsules (100 mg total) by mouth 2 (two) times daily. 03/14/19   Cliffton Asters, MD  DULoxetine (CYMBALTA) 60 MG capsule Take 1 capsule (60 mg total) by mouth daily. 01/15/19   Hoy Register, MD  ferrous sulfate 325 (65 FE) MG tablet Take 1 tablet (325 mg total) by mouth 2 (two) times daily with a meal. Patient not taking: Reported on 01/15/2019 08/10/18   Alwyn Ren, MD  hydrOXYzine (ATARAX/VISTARIL) 25 MG tablet Take 1 tablet by mouth three times daily as needed 03/09/19   Fulp, Cammie, MD  meloxicam (MOBIC) 7.5 MG tablet Take 1 tablet by mouth once daily 03/09/19   Fulp, Cammie, MD  oxyCODONE-acetaminophen (PERCOCET/ROXICET) 5-325 MG tablet Take 1 tablet by mouth every 6 (six) hours as needed for severe pain. Patient not taking: Reported on 10/05/2018 08/28/18   Magnant, Joycie Peek, PA-C  sildenafil (VIAGRA) 50 MG tablet TAKE 1 TABLET BY MOUTH ONCE DAILY AS NEEDED FOR ERECTILE DYSFUNCTION 03/09/19   Fulp, Cammie, MD  Sofosbuvir-Velpatasvir (EPCLUSA) 400-100 MG TABS Take 1 tablet by mouth daily. 10/11/18   Cliffton Asters, MD  vitamin B-12 100 MCG tablet Take 1 tablet (100 mcg total) by mouth daily. 08/10/18   Alwyn Ren, MD     Critical care time: 49 minutes     Joneen Roach, AGACNP-BC [facility]  See Amion for personal pager PCCM on call pager [phone] until 7pm. Please call Elink 7p-7a. [phone]  10/22/2021  12:35 AM

## 2021-10-22 ENCOUNTER — Telehealth: Payer: Self-pay | Admitting: Family Medicine

## 2021-10-22 ENCOUNTER — Inpatient Hospital Stay (HOSPITAL_COMMUNITY): Payer: Medicaid Other

## 2021-10-22 DIAGNOSIS — J9811 Atelectasis: Secondary | ICD-10-CM | POA: Diagnosis not present

## 2021-10-22 DIAGNOSIS — G9340 Encephalopathy, unspecified: Secondary | ICD-10-CM | POA: Diagnosis not present

## 2021-10-22 DIAGNOSIS — J90 Pleural effusion, not elsewhere classified: Secondary | ICD-10-CM | POA: Diagnosis not present

## 2021-10-22 DIAGNOSIS — R509 Fever, unspecified: Secondary | ICD-10-CM | POA: Diagnosis not present

## 2021-10-22 DIAGNOSIS — R4182 Altered mental status, unspecified: Secondary | ICD-10-CM | POA: Diagnosis not present

## 2021-10-22 DIAGNOSIS — J9690 Respiratory failure, unspecified, unspecified whether with hypoxia or hypercapnia: Secondary | ICD-10-CM

## 2021-10-22 DIAGNOSIS — R091 Pleurisy: Secondary | ICD-10-CM | POA: Diagnosis not present

## 2021-10-22 DIAGNOSIS — I82409 Acute embolism and thrombosis of unspecified deep veins of unspecified lower extremity: Secondary | ICD-10-CM

## 2021-10-22 DIAGNOSIS — R0989 Other specified symptoms and signs involving the circulatory and respiratory systems: Secondary | ICD-10-CM

## 2021-10-22 DIAGNOSIS — D72829 Elevated white blood cell count, unspecified: Secondary | ICD-10-CM

## 2021-10-22 DIAGNOSIS — R846 Abnormal cytological findings in specimens from respiratory organs and thorax: Secondary | ICD-10-CM | POA: Diagnosis not present

## 2021-10-22 DIAGNOSIS — J811 Chronic pulmonary edema: Secondary | ICD-10-CM | POA: Diagnosis not present

## 2021-10-22 DIAGNOSIS — R0600 Dyspnea, unspecified: Secondary | ICD-10-CM | POA: Diagnosis not present

## 2021-10-22 DIAGNOSIS — I639 Cerebral infarction, unspecified: Secondary | ICD-10-CM | POA: Diagnosis not present

## 2021-10-22 HISTORY — DX: Encephalopathy, unspecified: G93.40

## 2021-10-22 LAB — COMPREHENSIVE METABOLIC PANEL
ALT: 94 U/L — ABNORMAL HIGH (ref 0–44)
AST: 65 U/L — ABNORMAL HIGH (ref 15–41)
Albumin: 2 g/dL — ABNORMAL LOW (ref 3.5–5.0)
Alkaline Phosphatase: 186 U/L — ABNORMAL HIGH (ref 38–126)
Anion gap: 6 (ref 5–15)
BUN: 30 mg/dL — ABNORMAL HIGH (ref 6–20)
CO2: 27 mmol/L (ref 22–32)
Calcium: 8.5 mg/dL — ABNORMAL LOW (ref 8.9–10.3)
Chloride: 107 mmol/L (ref 98–111)
Creatinine, Ser: 1.1 mg/dL (ref 0.61–1.24)
GFR, Estimated: >60 mL/min (ref >60)
Glucose, Bld: 120 mg/dL — ABNORMAL HIGH (ref 70–99)
Potassium: 4.1 mmol/L (ref 3.5–5.1)
Sodium: 140 mmol/L (ref 135–145)
Total Bilirubin: 0.8 mg/dL (ref 0.3–1.2)
Total Protein: 7.1 g/dL (ref 6.5–8.1)

## 2021-10-22 LAB — MAGNESIUM: Magnesium: 2.7 mg/dL — ABNORMAL HIGH (ref 1.7–2.4)

## 2021-10-22 LAB — CBC
HCT: 26.3 % — ABNORMAL LOW (ref 39.0–52.0)
Hemoglobin: 8.8 g/dL — ABNORMAL LOW (ref 13.0–17.0)
MCH: 33.1 pg (ref 26.0–34.0)
MCHC: 33.5 g/dL (ref 30.0–36.0)
MCV: 98.9 fL (ref 80.0–100.0)
Platelets: 619 10*3/uL — ABNORMAL HIGH (ref 150–400)
RBC: 2.66 MIL/uL — ABNORMAL LOW (ref 4.22–5.81)
RDW: 14.6 % (ref 11.5–15.5)
WBC: 20.5 10*3/uL — ABNORMAL HIGH (ref 4.0–10.5)
nRBC: 0 % (ref 0.0–0.2)

## 2021-10-22 LAB — TSH: TSH: 2.234 u[IU]/mL (ref 0.350–4.500)

## 2021-10-22 LAB — BODY FLUID CELL COUNT WITH DIFFERENTIAL
Eos, Fluid: 0 %
Lymphs, Fluid: 38 %
Monocyte-Macrophage-Serous Fluid: 14 % — ABNORMAL LOW (ref 50–90)
Neutrophil Count, Fluid: 48 % — ABNORMAL HIGH (ref 0–25)
Total Nucleated Cell Count, Fluid: 1110 cu mm — ABNORMAL HIGH (ref 0–1000)

## 2021-10-22 LAB — URINALYSIS, ROUTINE W REFLEX MICROSCOPIC
Bilirubin Urine: NEGATIVE
Glucose, UA: 50 mg/dL — AB
Ketones, ur: NEGATIVE mg/dL
Leukocytes,Ua: NEGATIVE
Nitrite: NEGATIVE
Protein, ur: 100 mg/dL — AB
Specific Gravity, Urine: 1.027 (ref 1.005–1.030)
pH: 7 (ref 5.0–8.0)

## 2021-10-22 LAB — GLUCOSE, CAPILLARY
Glucose-Capillary: 101 mg/dL — ABNORMAL HIGH (ref 70–99)
Glucose-Capillary: 116 mg/dL — ABNORMAL HIGH (ref 70–99)
Glucose-Capillary: 112 mg/dL — ABNORMAL HIGH (ref 70–99)
Glucose-Capillary: 126 mg/dL — ABNORMAL HIGH (ref 70–99)
Glucose-Capillary: 129 mg/dL — ABNORMAL HIGH (ref 70–99)

## 2021-10-22 LAB — LIPID PANEL
Cholesterol: 161 mg/dL (ref 0–200)
HDL: 16 mg/dL — ABNORMAL LOW (ref >40)
LDL Cholesterol: 97 mg/dL (ref 0–99)
Total CHOL/HDL Ratio: 10.1 RATIO
Triglycerides: 238 mg/dL — ABNORMAL HIGH (ref <150)
VLDL: 48 mg/dL — ABNORMAL HIGH (ref 0–40)

## 2021-10-22 LAB — PROTIME-INR
INR: 1 (ref 0.8–1.2)
Prothrombin Time: 12.7 seconds (ref 11.4–15.2)

## 2021-10-22 LAB — GLUCOSE, PLEURAL OR PERITONEAL FLUID: Glucose, Fluid: 107 mg/dL

## 2021-10-22 LAB — C DIFFICILE QUICK SCREEN W PCR REFLEX
C Diff antigen: NEGATIVE
C Diff interpretation: NOT DETECTED
C Diff toxin: NEGATIVE

## 2021-10-22 LAB — LACTATE DEHYDROGENASE, PLEURAL OR PERITONEAL FLUID: LD, Fluid: 552 U/L — ABNORMAL HIGH (ref 3–23)

## 2021-10-22 LAB — PHOSPHORUS: Phosphorus: 4.1 mg/dL (ref 2.5–4.6)

## 2021-10-22 LAB — AMYLASE, PLEURAL OR PERITONEAL FLUID: Amylase, Fluid: 29 U/L

## 2021-10-22 LAB — VITAMIN B12: Vitamin B-12: 503 pg/mL (ref 180–914)

## 2021-10-22 LAB — HEMOGLOBIN A1C
Hgb A1c MFr Bld: 5.5 % (ref 4.8–5.6)
Mean Plasma Glucose: 111.15 mg/dL

## 2021-10-22 LAB — AMMONIA: Ammonia: 48 umol/L — ABNORMAL HIGH (ref 9–35)

## 2021-10-22 LAB — MRSA NEXT GEN BY PCR, NASAL: MRSA by PCR Next Gen: NOT DETECTED

## 2021-10-22 LAB — PROCALCITONIN: Procalcitonin: 0.58 ng/mL

## 2021-10-22 LAB — ALBUMIN, PLEURAL OR PERITONEAL FLUID: Albumin, Fluid: 1.8 g/dL

## 2021-10-22 LAB — FOLATE: Folate: 12.7 ng/mL (ref >5.9)

## 2021-10-22 LAB — LACTATE DEHYDROGENASE: LDH: 263 U/L — ABNORMAL HIGH (ref 98–192)

## 2021-10-22 LAB — HIV ANTIBODY (ROUTINE TESTING W REFLEX): HIV Screen 4th Generation wRfx: NONREACTIVE

## 2021-10-22 LAB — PROTEIN, PLEURAL OR PERITONEAL FLUID: Total protein, fluid: 4.2 g/dL

## 2021-10-22 MED ORDER — OLANZAPINE 5 MG PO TABS
5.0000 mg | ORAL_TABLET | Freq: Every day | ORAL | Status: DC
  Administered 2021-10-22 – 2021-10-25 (×5): 5 mg
  Filled 2021-10-22 (×5): qty 1

## 2021-10-22 MED ORDER — THIAMINE HCL 100 MG/ML IJ SOLN
250.0000 mg | Freq: Every day | INTRAVENOUS | Status: AC
  Administered 2021-10-25 – 2021-10-30 (×6): 250 mg via INTRAVENOUS
  Filled 2021-10-22 (×7): qty 2.5

## 2021-10-22 MED ORDER — POLYETHYLENE GLYCOL 3350 17 G PO PACK
17.0000 g | PACK | Freq: Every day | ORAL | Status: DC | PRN
  Administered 2021-11-02 – 2021-11-24 (×5): 17 g via ORAL
  Filled 2021-10-22 (×5): qty 1

## 2021-10-22 MED ORDER — SODIUM CHLORIDE 0.9 % IV SOLN: 2.0000 g | Freq: Three times a day (TID) | INTRAVENOUS | Status: DC

## 2021-10-22 MED ORDER — ETOMIDATE 2 MG/ML IV SOLN
INTRAVENOUS | Status: AC
  Filled 2021-10-22: qty 10

## 2021-10-22 MED ORDER — SODIUM CHLORIDE 0.9 % IV SOLN: 2.0000 g | INTRAVENOUS | Status: DC

## 2021-10-22 MED ORDER — ENOXAPARIN SODIUM 80 MG/0.8ML IJ SOSY
80.0000 mg | PREFILLED_SYRINGE | Freq: Two times a day (BID) | INTRAMUSCULAR | Status: DC
  Administered 2021-10-22: 80 mg via SUBCUTANEOUS
  Filled 2021-10-22 (×2): qty 0.8

## 2021-10-22 MED ORDER — STERILE WATER FOR INJECTION IJ SOLN
5.0000 mg | Freq: Once | RESPIRATORY_TRACT | Status: AC
  Administered 2021-10-22: 5 mg via INTRAPLEURAL
  Filled 2021-10-22: qty 5

## 2021-10-22 MED ORDER — SODIUM CHLORIDE (PF) 0.9 % IJ SOLN
10.0000 mg | Freq: Once | INTRAMUSCULAR | Status: AC
  Administered 2021-10-22: 10 mg via INTRAPLEURAL
  Filled 2021-10-22: qty 10

## 2021-10-22 MED ORDER — THIAMINE HCL 100 MG/ML IJ SOLN
500.0000 mg | Freq: Three times a day (TID) | INTRAVENOUS | Status: AC
  Administered 2021-10-22 – 2021-10-24 (×6): 500 mg via INTRAVENOUS
  Filled 2021-10-22 (×6): qty 5

## 2021-10-22 MED ORDER — SODIUM CHLORIDE 0.9% FLUSH
10.0000 mL | Freq: Three times a day (TID) | INTRAVENOUS | Status: DC
  Administered 2021-10-22 – 2021-10-24 (×6): 10 mL via INTRAPLEURAL

## 2021-10-22 MED ORDER — DOCUSATE SODIUM 100 MG PO CAPS
100.0000 mg | ORAL_CAPSULE | Freq: Two times a day (BID) | ORAL | Status: DC | PRN
  Administered 2021-11-24: 100 mg via ORAL
  Filled 2021-10-22: qty 1

## 2021-10-22 MED ORDER — PANTOPRAZOLE SODIUM 40 MG IV SOLR: 40.0000 mg | INTRAVENOUS | Status: DC

## 2021-10-22 MED ORDER — VITAL AF 1.2 CAL PO LIQD
1000.0000 mL | ORAL | Status: DC
  Administered 2021-10-22 – 2021-10-27 (×4): 1000 mL
  Filled 2021-10-22 (×2): qty 1000

## 2021-10-22 MED ORDER — PANTOPRAZOLE 2 MG/ML SUSPENSION
40.0000 mg | Freq: Every day | ORAL | Status: DC
  Administered 2021-10-22 – 2021-10-26 (×5): 40 mg
  Filled 2021-10-22 (×7): qty 20

## 2021-10-22 MED ORDER — ACETAMINOPHEN 325 MG PO TABS: 650.0000 mg | ORAL_TABLET | Freq: Four times a day (QID) | ORAL | Status: DC | PRN

## 2021-10-22 MED ORDER — VITAL HIGH PROTEIN PO LIQD
1000.0000 mL | ORAL | Status: DC
  Administered 2021-10-22: 1000 mL

## 2021-10-22 MED ORDER — ACETAMINOPHEN 325 MG PO TABS
650.0000 mg | ORAL_TABLET | Freq: Four times a day (QID) | ORAL | Status: DC | PRN
  Administered 2021-10-22 – 2021-11-10 (×7): 650 mg
  Filled 2021-10-22 (×7): qty 2

## 2021-10-22 MED ORDER — PROSOURCE TF20 ENFIT COMPATIBL EN LIQD: 60.0000 mL | Freq: Every day | ENTERAL | Status: DC

## 2021-10-22 MED ORDER — THIAMINE HCL 100 MG/ML IJ SOLN
100.0000 mg | Freq: Every day | INTRAMUSCULAR | Status: DC
  Administered 2021-10-31 – 2021-11-05 (×6): 100 mg via INTRAVENOUS
  Filled 2021-10-22 (×6): qty 2

## 2021-10-22 MED ORDER — PIPERACILLIN-TAZOBACTAM 3.375 G IVPB
3.3750 g | Freq: Three times a day (TID) | INTRAVENOUS | Status: AC
  Administered 2021-10-22 – 2021-10-28 (×20): 3.375 g via INTRAVENOUS
  Filled 2021-10-22 (×22): qty 50

## 2021-10-22 MED ORDER — ETOMIDATE 2 MG/ML IV SOLN
INTRAVENOUS | Status: AC
  Administered 2021-10-22: 20 mg
  Filled 2021-10-22: qty 10

## 2021-10-22 NOTE — Consult Note (Signed)
Neurology Consultation  Reason for Consult: Altered mental status, strokes on MRI Referring Physician: Tamala Julian  CC: Stroke on MRI  History is obtained from: Chart review, daughter at bedside  HPI: [name] is a 56 y.o. male past medical history significant for polysubstance abuse, hepatitis C, and GERD who presented to [facility] in [name] on 9/6 after being found unresponsive.  A wellness check was called and EMS found history to be unresponsive.  His downtime is unknown however his LKW is approximated to be 1300 on 9/6.  He was found apneic with a thready pulse and had no response to intranasal Narcan.  He was intubated upon arrival to the emergency department around 2300 that evening.  CT head demonstrated some cerebral edema without loss of white matter differentiation.  They were unable to obtain an MRI at the outlying hospital due to incompatible IV pumps and ventilators.  Due to a shortage of ICU beds in [place] he was unable to be transferred until 9/13.  It was discovered that he has a right upper extremity thrombus in the mid and distal basilic vein.  He was started on Lovenox which has been held in anticipation for an LP on 9/15. He has been febrile-  Tmax 38.4 in the last 24 hours.   In speaking with his daughter, it was explained that they are estranged and she is unsure if he uses IV drugs, however she does suspect that his cocaine may have been laced with fentanyl or something else.  She has but been unable to obtain more information from the investigators who are involved with his case.   LKW: 1300 10/14/2021  NIHSS components Score: Comment  1a Level of Conscious 0[]  1[]  2[]  3[x]         1b LOC Questions 0[]  1[]  2[x]           1c LOC Commands 0[]  1[]  2[x]           2 Best Gaze 0[x]  1[]  2[]           3 Visual 0[x]  1[]  2[]  3[]         4 Facial Palsy 0[x]  1[]  2[]  3[]         5a Motor Arm - left 0[]  1[]  2[]  3[]  4[x]  UN[]     5b Motor Arm - Right 0[]  1[]  2[]  3[]  4[x]   UN[]     6a Motor Leg - Left 0[]  1[]  2[]  3[x]  4[]  UN[]     6b Motor Leg - Right 0[]  1[]  2[]  3[x]  4[]  UN[]     7 Limb Ataxia 0[x]  1[]  2[]  3[]  UN[]       8 Sensory 0[x]  1[]  2[]  UN[]         9 Best Language 0[]  1[]  2[]  3[x]         10 Dysarthria 0[]  1[]  2[]  UN[x]         11 Extinct. and Inattention 0[x]  1[]  2[]           TOTAL: 24        ROS: Unable to obtain due to altered mental status.   Past Medical History:  Diagnosis Date   Acid reflux    Head injury    Hepatitis C    Hiatal hernia     Family History  Problem Relation Age of Onset   Diabetes Mother    Hypertension Mother    Hypertension Father     Social History:   reports that he has been smoking cigarettes. He has a 20.00 pack-year smoking history. He has never used  smokeless tobacco. He reports current alcohol use. He reports that he does not currently use drugs after having used the following drugs: Cocaine.  Medications  Current Facility-Administered Medications:    acetaminophen (TYLENOL) tablet 650 mg, 650 mg, Per Tube, Q6H PRN, Larinda Buttery, MD, 650 mg at 10/22/21 0401   Chlorhexidine Gluconate Cloth 2 % PADS 6 each, 6 each, Topical, Daily, Hunsucker, Lesia Sago, MD, 6 each at 10/21/21 2302   docusate sodium (COLACE) capsule 100 mg, 100 mg, Oral, BID PRN, Duayne Cal, NP   feeding supplement (VITAL AF 1.2 CAL) liquid 1,000 mL, 1,000 mL, Per Tube, Continuous, Lorin Glass, MD, Last Rate: 45 mL/hr at 10/22/21 1415, 1,000 mL at 10/22/21 1415   fentaNYL (SUBLIMAZE) injection 50 mcg, 50 mcg, Intravenous, Q15 min PRN, Duayne Cal, NP   fentaNYL (SUBLIMAZE) injection 50-200 mcg, 50-200 mcg, Intravenous, Q30 min PRN, Duayne Cal, NP   OLANZapine (ZYPREXA) tablet 5 mg, 5 mg, Per Tube, QHS, Duayne Cal, NP, 5 mg at 10/22/21 0208   Oral care mouth rinse, 15 mL, Mouth Rinse, Q2H, Hunsucker, Lesia Sago, MD, 15 mL at 10/22/21 1406   Oral care mouth rinse, 15 mL, Mouth Rinse, PRN, Hunsucker, Lesia Sago, MD    pantoprazole (PROTONIX) 2 mg/mL oral suspension 40 mg, 40 mg, Per Tube, Daily, Duayne Cal, NP, 40 mg at 10/22/21 1122   piperacillin-tazobactam (ZOSYN) IVPB 3.375 g, 3.375 g, Intravenous, Q8H, Lorin Glass, MD, Paused at 10/22/21 1157   polyethylene glycol (MIRALAX / GLYCOLAX) packet 17 g, 17 g, Oral, Daily PRN, Duayne Cal, NP   sodium chloride flush (NS) 0.9 % injection 10 mL, 10 mL, Intrapleural, Q8H, Lorin Glass, MD  Exam: Current vital signs: BP 137/88   Pulse (!) 109   Temp (!) 100.9 F (38.3 C)   Resp 19   Ht 5\' 10"  (1.778 m)   Wt 80.9 kg   SpO2 98%   BMI 25.59 kg/m  Vital signs in last 24 hours: Temp:  [99.3 F (37.4 C)-100.9 F (38.3 C)] 100.9 F (38.3 C) (09/14 1315) Pulse Rate:  [93-117] 109 (09/14 1315) Resp:  [18-30] 19 (09/14 1315) BP: (102-190)/(72-135) 137/88 (09/14 1315) SpO2:  [93 %-100 %] 98 % (09/14 1315) FiO2 (%):  [30 %-40 %] 40 % (09/14 1120) Weight:  [80.9 kg] 80.9 kg (09/14 0400)  GENERAL: unresponsive HEENT: - Normocephalic and atraumatic, dry mm, no LN++, no Thyromegally LUNGS - Clear to auscultation bilaterally with no wheezes CV - S1S2 RRR, no m/r/g, equal pulses bilaterally. ABDOMEN - Soft, nontender, nondistended with normoactive BS Ext: warm, well perfused, intact peripheral pulses, no edema  NEURO:  Mental Status: Unresponsive to verbal stimuli. Opens eyes inconsistently with sternal rub. No response to noxious stimuli in extremities. Language: intubated, no sedation Cranial Nerves: PERRL 44mm/sluggish. Eyes midline, no facial asymmetry noted. Head is midline,   Motor: no purposeful or spontaneous movement in extremities Tone: is normal and bulk is normal Sensation- does not localize to pain Coordination: unable to participate Toes up going Gait- deferred  Labs I have reviewed labs in epic and the results pertinent to this consultation are:  CBC    Component Value Date/Time   WBC 20.5 (H) 10/22/2021 0049   RBC 2.66  (L) 10/22/2021 0049   HGB 8.8 (L) 10/22/2021 0049   HCT 26.3 (L) 10/22/2021 0049   PLT 619 (H) 10/22/2021 0049   MCV 98.9 10/22/2021 0049   MCH 33.1 10/22/2021 0049  MCHC 33.5 10/22/2021 0049   RDW 14.6 10/22/2021 0049   LYMPHSABS 2.0 09/05/2018 0257   MONOABS 0.5 09/05/2018 0257   EOSABS 0.1 09/05/2018 0257   BASOSABS 0.1 09/05/2018 0257    CMP     Component Value Date/Time   NA 140 10/22/2021 0049   K 4.1 10/22/2021 0049   CL 107 10/22/2021 0049   CO2 27 10/22/2021 0049   GLUCOSE 120 (H) 10/22/2021 0049   BUN 30 (H) 10/22/2021 0049   CREATININE 1.10 10/22/2021 0049   CREATININE 1.04 12/28/2018 1633   CALCIUM 8.5 (L) 10/22/2021 0049   PROT 7.1 10/22/2021 0049   ALBUMIN 2.0 (L) 10/22/2021 0049   AST 65 (H) 10/22/2021 0049   ALT 94 (H) 10/22/2021 0049   ALT 23 09/21/2018 1616   ALKPHOS 186 (H) 10/22/2021 0049   BILITOT 0.8 10/22/2021 0049   GFRNONAA >60 10/22/2021 0049   GFRAA >60 09/05/2018 0257    Lipid Panel     Component Value Date/Time   CHOL 191 12/17/2013 1004   TRIG 145 12/17/2013 1004   HDL 53 12/17/2013 1004   CHOLHDL 3.6 12/17/2013 1004   VLDL 29 12/17/2013 1004   LDLCALC 109 (H) 12/17/2013 1004     Imaging I have reviewed the images obtained:   MRI examination of the brain Multifocal, primarily small acute infarcts involving bilateral cerebral hemispheres. Bilateral abnormal cerebellar signal may reflect mild vasogenic edema without mass effect. Nonspecific and may be toxic/metabolic in etiology.  EEG 9/14 This study is suggestive of severe diffuse encephalopathy, nonspecific etiology. No seizures or epileptiform discharges were seen throughout the recording.  Assessment:   56 y.o. male past medical history significant for polysubstance abuse, hepatitis C, and GERD who presented to [facility] in [place] on 9/6 after being found unresponsive apneic with a thready pulse. He was intubated at [facility] in [place] in the ED on 9/6  and then transferred to [facility] on 9/13 for MRI. MRI shows bilateral subacute infarcts and an abnormal cerebellar signal. He is currently on a cEEG and will have an LP tomorrow. Blood cultures are currently negative. He did have a chest tube placed for an effusion and bronch done today. Sputum and pleural fluid is currently pending.   Recommendations: - Continuous EEG - Plan for LP tomorrow with CCM - Avoid deliriogenic and sedating medications if possible - Correct metabolic derangements  Patient seen and examined by NP/APP with MD. MD to update note as needed.   Janine Ores, DNP, [name]-BC [facility] Pager: [phone]   NEUROHOSPITALIST ADDENDUM Performed a face to face diagnostic evaluation.   I have reviewed the contents of history and physical exam as documented by PA/ARNP/Resident and agree with above documentation.  I have discussed and formulated the above plan as documented. Edits to the note have been made as needed.  Impression/Key exam findings/Plan: 22M hx of polysubstance use who was found down on 9/6 with thready pulse and apneic and intubated at OSH. Very little information available about the incident that led to his hospitalization. Daughter is estranged. UDS positive for cocaine. High suspicion for anoxic brain injury. MRI Brain demonstrates mild BL vasogenic cerebellar edema(?cocaine leukoencephalopathy vs hypomagnesemia, less likely CHANTERs) along with BL embolic appearing infarct which do not seem to be in watershed distribution. These findings are very atypical for anoxic injury which is our leading diagnosis. Exam consistent with minimally conscious state. He partially and very slightly opens eyes to multiple loud claps but only for  1-2 secs and then closes it. Does not make eye contact. He has intact brainstem reflexes with withdrawal in BL lower extremities and no movement in BL uppers. He spontaneously moves BL lower extremities but these movements do not  seem purposeful.  Plan: [ ]  cEEG [ ]  encephalopathy labs along with volatile organic panel and Carbon monoxide panel. [ ]  High dose thiamine replacement while levels are pending. [ ]  Agree with LP with infectious studies with cell count, differential, protein, glucose, meningitis/encephalitis panel, CSF gram stain and cultures, IgG index, oligoclonal bands. Save a tube for further testing in the future. [ ]  off sedation for the last day with no change in mentation per RN. [ ]  stroke workup with TTE, Lipid panel, HbA1c, vessel imaging with CT Angio head and neck. [ ]  CT Venogram to rule out dural venous thrombosis.   This patient is critically ill and at significant risk of neurological worsening, death and care requires constant monitoring of vital signs, hemodynamics,respiratory and cardiac monitoring, neurological assessment, discussion with family, other specialists and medical decision making of high complexity. I spent 60 minutes of neurocritical care time  in the care of  this patient. This was time spent independent of any time provided by nurse practitioner or PA.  [facility] Pager Number 10/22/2021  9:14 PM  , MD [facility]   If 7pm to 7am, please call on call as listed on AMION.

## 2021-10-22 NOTE — Telephone Encounter (Signed)
Pts daughter is calling to report that the pt. Is at [facility] under intubation. [name] [phone]

## 2021-10-22 NOTE — Procedures (Signed)
Insertion of Chest Tube Procedure Note  [name]  [id number]  12-27-1965  Date:10/22/21  Time:11:07 AM    Provider Performing: Jim Like   Procedure: Chest Tube Insertion (32551)  Indication(s) Effusion  Consent Risks of the procedure as well as the alternatives and risks of each were explained to the patient and/or caregiver.  Consent for the procedure was obtained and is signed in the bedside chart  Anesthesia Topical only with 1% lidocaine    Time Out Verified patient identification, verified procedure, site/side was marked, verified correct patient position, special equipment/implants available, medications/allergies/relevant history reviewed, required imaging and test results available.   Sterile Technique Maximal sterile technique including full sterile barrier drape, hand hygiene, sterile gown, sterile gloves, mask, hair covering, sterile ultrasound probe cover (if used).   Procedure Description Ultrasound used to identify appropriate pleural anatomy for placement and overlying skin marked. Area of placement cleaned and draped in sterile fashion.  A 14 French pigtail pleural catheter was placed into the right pleural space using Seldinger technique. Appropriate return of fluid was obtained.  The tube was connected to atrium and placed on -20 cm H2O wall suction.   Complications/Tolerance None; patient tolerated the procedure well. Chest X-ray is ordered to verify placement.   EBL Minimal  Specimen(s) fluid

## 2021-10-22 NOTE — Procedures (Signed)
Pleural Fibrinolytic Administration Procedure Note  [name]  [id number]  11/07/1965  Date:10/22/21  Time:1:55 PM   Provider Performing:Evonte Prestage  Reece Agar Anistyn Graddy   Procedure: Pleural Fibrinolysis Initial day (40981)  Indication(s) Fibrinolysis of complicated pleural effusion  Consent Risks of the procedure as well as the alternatives and risks of each were explained to the patient and/or caregiver.  Consent for the procedure was obtained.   Anesthesia None   Time Out Verified patient identification, verified procedure, site/side was marked, verified correct patient position, special equipment/implants available, medications/allergies/relevant history reviewed, required imaging and test results available.   Sterile Technique Hand hygiene, gloves   Procedure Description Existing pleural catheter was cleaned and accessed in sterile manner.  10mg  of tPA in 30cc of saline and 5mg  of dornase in 30cc of sterile water were injected into pleural space using existing pleural catheter.  Catheter will be clamped for 1 hour and then placed back to suction.   Complications/Tolerance None; patient tolerated the procedure well.  EBL None   Specimen(s) None

## 2021-10-22 NOTE — Progress Notes (Signed)
NAME:  [name], MRN:  [id number], DOB:  Jun 21, 1965, LOS: 1 ADMISSION DATE:  10/21/2021, CONSULTATION DATE:  10/22/21 REFERRING MD:  Estelle Grumbles, CHIEF COMPLAINT:  anoxic injury   History of Present Illness:  56 year old male with past medical history significant for polysubstance abuse, hepatitis C, and GERD who presented to [facility] in [place] on 9/6 after being found down.  He was last known well at approximately 1300 hrs. that day, however, EMS was called for wellness check at some time later on.  He was found to be down and unresponsive.  Upon EMS evaluation the patient was apneic with a thready pulse.  He had no response to intranasal Narcan.  He was bagged in route to the emergency department at which he arrived around 2300 hrs.  He was intubated immediately upon arrival to the ED.  CT of the head demonstrated cerebral edema without loss of white matter differentiation.  Repeat CT on 9/7 was read as normal by radiology, however, the neurologist still remained concerned for anoxic injury and recommended transfer to tertiary center for MRI, as the sending facility does not have ventilators or IV pumps compatible with MRI.  The patient was unable to transfer due to lack of available ICU beds.  The patient had been unable to wean from sedation due to significant agitation.  He would have nonpurposeful movement in the left upper and bilateral lower extremities.  No movement has been seen in his right upper extremity since presentation.  On 9/11 he was noted to have upper extremity edema on the right.  Ultrasound demonstrated acute thrombus in the mid and distal basilic vein.  Course also complicated by aspiration pneumonia for which he has completed a course of antibiotics.   On 9/13 he was transferred to [facility].   Pertinent  Medical History  GERD Head injury Hepatitis C Hiatal hernia Cocaine use (per daughter's report)  Significant Hospital Events: Including procedures,  antibiotic start and stop dates in addition to other pertinent events   9/6 found down > present to [facility] ED. CT with bilateral cerebellar edema. 9/7 repeat CT normal Unable to be weaned from sedation 9/11 RUE DVT started on tx dose lovenox 9/13 transfer to [facility] for MRI  Interim History / Subjective:  Febrile, tachycardic, and tachypneic overnight. Continues to be unarousable. Tolerating tube feeds. Making urine and stool.   Objective   Blood pressure (!) 141/91, pulse (!) 106, temperature 100 F (37.8 C), resp. rate (!) 24, height '5\' 10"'  (1.778 m), weight 80.9 kg, SpO2 98 %.    Vent Mode: PRVC FiO2 (%):  [30 %] 30 % Set Rate:  [18 bmp] 18 bmp Vt Set:  [550 mL] 550 mL PEEP:  [5 cmH20] 5 cmH20 Plateau Pressure:  [17 cmH20-18 cmH20] 18 cmH20   Intake/Output Summary (Last 24 hours) at 10/22/2021 0654 Last data filed at 10/22/2021 0600 Gross per 24 hour  Intake 184.67 ml  Output 550 ml  Net -365.33 ml   Filed Weights   10/21/21 2300 10/22/21 0400  Weight: 80.9 kg 80.9 kg    Examination: General: Ill appearing man lying in bed without any acute distress Neuro: Unarousable to sternal rub. Does not follow commands. No eye opening or upper extremity movement noted. Spontaneous bilateral lower extremity movement noted but appears non-purposeful. Does not withdraw to pain.  On ventilator without sedation.  HENT: [place]/AT. PEEERL. ETT and OGT in place. Lungs: On ventilator (PRVC RR 18, Vt 550, PEEP 5, FiO2 30%) without  sedation. Breathing over vent but not recruiting accessory muscles. Lungs CTAB without wheezes, crackles, or rhonchi. Cardiovascular: Tachycardic but no murmurs, rubs, or gallops. DP pulses 2+ bilaterally.  Abdomen: Soft, NT/ND with normoactive bowel sounds Extremities: WWP. DP pulses 2+ bilaterally.  GU: Foley in place  Ancillary tests  WBC 20.5, Hb 8.8, platelets 619 Procalcitonin 0.58 BUN 30, Cr 1.10 AST 65, ALT 94, alk phos 186, Tbili 0.8 Albumin 2.0 Mg 2.7, phos  4.1 ABG: pH 7.44, pCO2 38.3, pO2 85 UA: small hemoglobinuria, mild glucosuria, proteinuria  HIV non-reactive  Blood culture pending   CXR 9/14: right pleural effusion with consolidation at right lung base, nodular opacity over RUL  Assessment & Plan:  Acute encephalopathy  Patient initially found to be unresponsive and apneic, requiring urgent intubation. Initial head CT 9/6 with vasogenic edema in bilateral cerebellar hemispheres, repeat 9/7 read as normal but no improvement in physical exam. Spot EEG negative for epileptiform activity. Currently unarousable to sternal rub but has bilateral lower extremity non-purposeful movement. Etiology still unclear but differential includes anoxic brain injury vs CNS infection vs PRES vs uremic/hepatic encephalopathy.  - MRI brain - EEG - Ammonia - LP 9/15 - Supportive care and neuro-protective measures in the ICU.  - RASS goal 0 to -1. Can start precedex if needed - Continue Zyprexa 83m   Fever, tachycardia, leukocytosis, encephalopathy Patient previously completed course of ceftriaxone and azithromycin for presumed aspiration pneumonia. Subsequently had worsening of fever and leukocytosis so empiric ceftriaxone was restarted 9/11. Sputum culture at OSH with normal flora. CXR with RUL nodular opacity and right base consolidation with pleural effusion. UA negative. Continues to be febrile, tachycardic, and with leukocytosis and elevated procalcitonin. Etiology most likely infectious. Differential includes inadequate prior pneumonia coverage, empyema, or CNS infection.  - Broaden antibiotic coverage to Zosyn - Sputum culture - Blood culture pending  - UA negative - MRI - Right sided thoracentesis  - Plan for LP 9/15   Acute hypoxemic respiratory failure secondary to aspiration pneumonia Currently on minimal vent support but mental status remains poor so not yet a candidate for weaning. ABG within normal limits. - Full vent support - SAT/SBT as  tolerated - Consider early trach  Superficial vein thrombosis Diagnosed 97/09with R basilic vein DVT - Hold lovenox, can resume after LP   Protein calorie malnutrition - RD consult - Electrolyte repletion PRN  Transaminitis Mild. AST 65, ALT 94.  - Continue to monitor - Ammonia  Best Practice (right click and "Reselect all SmartList Selections" daily)   Diet/type: tubefeeds DVT prophylaxis: LMWH GI prophylaxis: PPI Lines: N/A Foley:  Yes, and it is still needed Code Status:  full code Last date of multidisciplinary goals of care discussion [9/14]  Labs   CBC: Recent Labs  Lab 10/21/21 2335 10/22/21 0049  WBC  --  20.5*  HGB 11.2* 8.8*  HCT 33.0* 26.3*  MCV  --  98.9  PLT  --  619*    Basic Metabolic Panel: Recent Labs  Lab 10/21/21 2335 10/22/21 0049  NA 141 140  K 3.9 4.1  CL  --  107  CO2  --  27  GLUCOSE  --  120*  BUN  --  30*  CREATININE  --  1.10  CALCIUM  --  8.5*  MG  --  2.7*  PHOS  --  4.1   GFR: Estimated Creatinine Clearance: 76.5 mL/min (by C-G formula based on SCr of 1.1 mg/dL). Recent Labs  Lab 10/22/21 0049  PROCALCITON 0.58  WBC 20.5*    Liver Function Tests: Recent Labs  Lab 10/22/21 0049  AST 65*  ALT 94*  ALKPHOS 186*  BILITOT 0.8  PROT 7.1  ALBUMIN 2.0*    ABG    Component Value Date/Time   PHART 7.444 10/21/2021 2335   PCO2ART 38.3 10/21/2021 2335   PO2ART 85 10/21/2021 2335   HCO3 26.1 10/21/2021 2335   TCO2 27 10/21/2021 2335   O2SAT 96 10/21/2021 2335     Coagulation Profile: Recent Labs  Lab 10/22/21 0049  INR 1.0     HbA1C: Hgb A1c MFr Bld  Date/Time Value Ref Range Status  02/14/2019 09:11 AM 5.5 4.8 - 5.6 % Final    Comment:             Prediabetes: 5.7 - 6.4          Diabetes: >6.4          Glycemic control for adults with diabetes: <7.0   12/17/2013 10:04 AM 5.7 (H) <5.7 % Final    Comment:                                                                           According to the  ADA Clinical Practice Recommendations for 2011, when HbA1c is used as a screening test:     >=6.5%   Diagnostic of Diabetes Mellitus            (if abnormal result is confirmed)   5.7-6.4%   Increased risk of developing Diabetes Mellitus   References:Diagnosis and Classification of Diabetes Mellitus,Diabetes ASNK,5397,67(HALPF 1):S62-S69 and Standards of Medical Care in         Diabetes - 2011,Diabetes Care,2011,34 (Suppl 1):S11-S61.       CBG: Recent Labs  Lab 10/21/21 2258 10/22/21 0318  GLUCAP 123* 101*    Review of Systems:   Unable to perform due to encephalopathy  Past Medical History:  He,  has a past medical history of Acid reflux, Head injury, Hepatitis C, and Hiatal hernia.   Surgical History:   Past Surgical History:  Procedure Laterality Date   ACROMIO-CLAVICULAR JOINT REPAIR Right 06/28/2018   Procedure: ACROMIO-CLAVICULAR JOINT IRRIGATION AND DEBRIDEMENT;  Surgeon: [name], MD;  Location: [facility];  Service: Orthopedics;  Laterality: Right;   FACIAL FRACTURE SURGERY     IRRIGATION AND DEBRIDEMENT SHOULDER Right 06/28/2018   Procedure: IRRIGATION AND DEBRIDEMENT SHOULDER;  Surgeon: [name], MD;  Location: [facility];  Service: Orthopedics;  Laterality: Right;   KNEE ARTHROSCOPY Left 06/23/2018   Procedure: LEFT KNEE ARTHROSCOPY KNEE I&D.;  Surgeon: [name], MD;  Location: [facility];  Service: Orthopedics;  Laterality: Left;     Social History:   reports that he has been smoking cigarettes. He has a 20.00 pack-year smoking history. He has never used smokeless tobacco. He reports current alcohol use. He reports that he does not currently use drugs after having used the following drugs: Cocaine.   Family History:  His family history includes Diabetes in his mother; Hypertension in his father and mother.   Allergies Not on File   Home Medications  Prior to Admission medications   Medication Sig Start Date End Date  Taking? Authorizing  Provider  cyclobenzaprine (FLEXERIL) 10 MG tablet Take 1 tablet (10 mg total) by mouth 3 (three) times daily as needed for muscle spasms. 01/15/19   Charlott Rakes, MD  diazepam (VALIUM) 5 MG tablet Take 1 tablet (5 mg total) by mouth every 8 (eight) hours as needed for anxiety (vertigo). Patient not taking: Reported on 01/15/2019 08/10/18   Georgette Shell, MD  doxycycline (VIBRAMYCIN) 50 MG capsule Take 2 capsules (100 mg total) by mouth 2 (two) times daily. 03/14/19   Michel Bickers, MD  DULoxetine (CYMBALTA) 60 MG capsule Take 1 capsule (60 mg total) by mouth daily. 01/15/19   Charlott Rakes, MD  ferrous sulfate 325 (65 FE) MG tablet Take 1 tablet (325 mg total) by mouth 2 (two) times daily with a meal. Patient not taking: Reported on 01/15/2019 08/10/18   Georgette Shell, MD  hydrOXYzine (ATARAX/VISTARIL) 25 MG tablet Take 1 tablet by mouth three times daily as needed 03/09/19   Fulp, Cammie, MD  meloxicam (MOBIC) 7.5 MG tablet Take 1 tablet by mouth once daily 03/09/19   Fulp, Cammie, MD  oxyCODONE-acetaminophen (PERCOCET/ROXICET) 5-325 MG tablet Take 1 tablet by mouth every 6 (six) hours as needed for severe pain. Patient not taking: Reported on 10/05/2018 08/28/18   Magnant, Gerrianne Scale, PA-C  sildenafil (VIAGRA) 50 MG tablet TAKE 1 TABLET BY MOUTH ONCE DAILY AS NEEDED FOR ERECTILE DYSFUNCTION 03/09/19   Fulp, Cammie, MD  Sofosbuvir-Velpatasvir (EPCLUSA) 400-100 MG TABS Take 1 tablet by mouth daily. 10/11/18   Michel Bickers, MD  vitamin B-12 100 MCG tablet Take 1 tablet (100 mcg total) by mouth daily. 08/10/18   Georgette Shell, MD     Critical care time: 35 minutes

## 2021-10-22 NOTE — Procedures (Signed)
Patient Name: [name]  MRN: [id number]  Epilepsy Attending: Charlsie Quest  Referring Physician/Provider: Chauncey Mann, DO Date: 10/22/2021 Duration: 29.28 mins  Patient history: 56yo m with ams. EEG to evaluate to seizure  Level of alertness: lethargic   AEDs during EEG study: None  Technical aspects: This EEG study was done with scalp electrodes positioned according to the 10-20 International system of electrode placement. Electrical activity was reviewed with band pass filter of 1-70Hz , sensitivity of 7 uV/mm, display speed of 24mm/sec with a 60Hz  notched filter applied as appropriate. EEG data were recorded continuously and digitally stored.  Video monitoring was available and reviewed as appropriate.  Description: EEG showed continuous generalized 3 to 6 Hz theta-delta slowing admixed with 12-14Hz  generalized beta activity. Hyperventilation and photic stimulation were not performed.     ABNORMALITY - Continuous slow, generalized  IMPRESSION: This study is suggestive of severe diffuse encephalopathy, nonspecific etiology. No seizures or epileptiform discharges were seen throughout the recording.  Amara Manalang 

## 2021-10-22 NOTE — Progress Notes (Signed)
ANTICOAGULATION CONSULT NOTE  Pharmacy Consult for enoxaparin Indication: DVT  Not on File  Patient Measurements: Height: 5\' 10"  (177.8 cm) Weight: 80.9 kg (178 lb 5.6 oz) IBW/kg (Calculated) : 73 Heparin Dosing Weight: 80kg  Vital Signs: Temp: 100.8 F (38.2 C) (09/14 0000) Temp Source: Oral (09/13 2300) BP: 102/72 (09/14 0000) Pulse Rate: 93 (09/14 0000)  Labs: Recent Labs    10/21/21 2335  HGB 11.2*  HCT 33.0*    CrCl cannot be calculated (Patient's most recent lab result is older than the maximum 21 days allowed.).   Medical History: Past Medical History:  Diagnosis Date   Acid reflux    Head injury    Hepatitis C    Hiatal hernia      Assessment: 89 yoM transferred from OSH. Pt initially admitted unresponsive with respiratory failure. Pt noted to have basilic vein thrombus 9/11 and was started on enoxaparin treatment dosing. Pharmacy asked to continue. Last dose was 9/13 0940 per paper chart, and Cr was ~1 mg/dl.  Goal of Therapy:  Anti-Xa level 0.6-1 units/ml 4hrs after LMWH dose given Monitor platelets by anticoagulation protocol: Yes   Plan:  Enoxaparin 80mg  SQ q12h LMWH level as needed  10/13, PharmD, BCPS, [facility] Clinical Pharmacist Please check AMION for all [facility] Pharmacy numbers 10/22/2021

## 2021-10-22 NOTE — Procedures (Signed)
Bronchoscopy Procedure Note  [name]  [id number]  05-Jun-1965  Date:10/22/21  Time:10:25 AM   Provider Performing:[name]  G [name]   Procedure(s):  Flexible bronchoscopy with bronchial alveolar lavage (07867)  Indication(s) Copious respiratory secretions, fever, leukocytosis  Consent Risks of the procedure as well as the alternatives and risks of each were explained to the patient and/or caregiver.  Consent for the procedure was obtained and is signed in the bedside chart  Anesthesia Etomidate   Time Out Verified patient identification, verified procedure, site/side was marked, verified correct patient position, special equipment/implants available, medications/allergies/relevant history reviewed, required imaging and test results available.   Sterile Technique Usual hand hygiene, masks, gowns, and gloves were used   Procedure Description Bronchoscope advanced through endotracheal tube and into airway.  Airways were examined down to subsegmental level with findings noted below.   Following diagnostic evaluation, BAL(s) performed in right lower lobe with normal saline and return of bloody, plug-filled fluid.  Findings: Tracheobronchomalacia right > left, stigmata of chronic bronchitis - exaggerated striations and generalized erythema and pallor   Complications/Tolerance None; patient tolerated the procedure well. Chest X-ray is needed post procedure.   EBL Minimal   Specimen(s) BAL right lower lobe / culture

## 2021-10-22 NOTE — Progress Notes (Addendum)
Initial Nutrition Assessment  DOCUMENTATION CODES:   Not applicable  INTERVENTION:   Tube feeding via OG tube: Change to Vital AF 1.2 at 75 ml/h (1800 ml per day)  Provides 2160 kcal, 135 gm protein, 1460 ml free water daily.  NUTRITION DIAGNOSIS:   Inadequate oral intake related to inability to eat as evidenced by NPO status.  GOAL:   Patient will meet greater than or equal to 90% of their needs  MONITOR:   Vent status, TF tolerance, Labs  REASON FOR ASSESSMENT:   Ventilator, Consult Enteral/tube feeding initiation and management  ASSESSMENT:   56 yo male admitted to [facility] after being found down. Transferred to [facility] with concerns for anoxic brain injury (CT showed bilateral cerebellar edema). PMH includes polysubstance abuse, hepatitis C, GERD, smoker.  MRI brain has been ordered.  Received MD Consult for TF initiation and management. OG tube in place with tip in the stomach per chest x-ray. Currently receiving Vital High Protein at 40 ml/h with Prosource TF 45 ml BID to provide 1040 kcal, 106 gm protein, 803 ml free water daily.   Patient is currently intubated on ventilator support MV: 11.2 L/min Temp (24hrs), Avg:100.1 F (37.8 C), Min:99.3 F (37.4 C), Max:100.8 F (38.2 C)   Labs reviewed.  CBG: 101-116  Medications reviewed and include Colace, Protonix, Miralax, IV antibiotics.  Weight history reviewed.  No significant weight changes noted recently.  NUTRITION - FOCUSED PHYSICAL EXAM:  Unable to complete, RD working remotely.  Diet Order:   Diet Order     None       EDUCATION NEEDS:   No education needs have been identified at this time  Skin:  Skin Assessment: Reviewed RN Assessment  Last BM:  9/14 type 7, rectal tube  Height:   Ht Readings from Last 1 Encounters:  10/21/21 5\' 10"  (1.778 m)    Weight:   Wt Readings from Last 1 Encounters:  10/22/21 80.9 kg    Ideal Body Weight:  75.5 kg  BMI:  Body mass index is  25.59 kg/m.  Estimated Nutritional Needs:   Kcal:  2100-2300  Protein:  120-140 gm  Fluid:  2.1-2.3 L   10/24/21 RD, LDN, CNSC Please refer to Amion for contact information.

## 2021-10-22 NOTE — Progress Notes (Signed)
Evening rounds  ETT exchange, bronch, R pigtail and pleural lytics today. Resting on vent. MRI findings noted and neuro input appreciated Plan on LP in AM cEEG, vent support  Daughter updated at bedside  Myrla Halsted MD PCCM

## 2021-10-22 NOTE — Progress Notes (Signed)
EEG complete - results pending 

## 2021-10-22 NOTE — Progress Notes (Deleted)
EEG complete - results pending 

## 2021-10-22 NOTE — Procedures (Signed)
Endotracheal Tube Exchange Procedure Note  [name]  [id number]  1965-08-28  Date:10/22/21  Time:10:22 AM   Provider Performing:Eloise Mula  G Jeda Pardue    Procedure: Endotracheal tube exchange (31500)  Indication(s) Respiratory Failure  Consent Risks of the procedure as well as the alternatives and risks of each were explained to the patient and/or caregiver.  Consent for the procedure was obtained and is signed in the bedside chart   Anesthesia Etomidate   Time Out Verified patient identification, verified procedure, site/side was marked, verified correct patient position, special equipment/implants available, medications/allergies/relevant history reviewed, required imaging and test results available.   Sterile Technique Usual hand hygeine, masks, and gloves were used   Procedure Description Patient positioned in bed supine.  Sedation given as noted above.  ET tube was exchanged using tube exchanger. Glidescope was not utilized.  Number of attempts was 1.  Colorimetric CO2 detector was consistent with tracheal placement.   Complications/Tolerance None; patient tolerated the procedure well. Chest X-ray is ordered to verify placement.   EBL Minimal   Specimen(s) None

## 2021-10-22 NOTE — TOC Progression Note (Signed)
Transition of Care [facility]) - Progression Note    Patient Details  Name: [name] MRN: [id number] Date of Birth: 21-Oct-1965  Transition of Care [facility]) CM/SW Contact  Beckie Busing, RN Phone Number:[phone]  10/22/2021, 3:15 PM  Clinical Narrative:     Transition of Care (TOC) Screening Note   Patient Details  Name: [name] Date of Birth: 01-17-1966   Transition of Care [facility]) CM/SW Contact:    Beckie Busing, RN Phone Number: 10/22/2021, 3:15 PM    Transition of Care Department [facility]) has reviewed patient and no TOC needs have been identified at this time. We will continue to monitor patient advancement through interdisciplinary progression rounds. If new patient transition needs arise, please place a TOC consult.          Expected Discharge Plan and Services                                                 Social Determinants of Health (SDOH) Interventions    Readmission Risk Interventions     No data to display

## 2021-10-22 NOTE — Progress Notes (Addendum)
eLink Physician-Brief Progress Note Patient Name: Kristy Catoe DOB: 12-Jan-1966 MRN: [id number]   Date of Service  10/22/2021  HPI/Events of Note  Notified of fever 100.46F.  Pt is on ceftriaxone for possible pneumonia. Cultures sent.   eICU Interventions  Plan for thoracentesis in the morning.  Tylenol PRN ordered.      Intervention Category Intermediate Interventions: Infection - evaluation and management  Larinda Buttery 10/22/2021, 2:29 AM

## 2021-10-22 NOTE — Progress Notes (Signed)
vLTM started  all impedances below 10kohms   [facility] to monitor   Patient event button tested 

## 2021-10-23 ENCOUNTER — Inpatient Hospital Stay (HOSPITAL_COMMUNITY): Payer: Medicaid Other

## 2021-10-23 DIAGNOSIS — R9389 Abnormal findings on diagnostic imaging of other specified body structures: Secondary | ICD-10-CM | POA: Diagnosis not present

## 2021-10-23 DIAGNOSIS — R846 Abnormal cytological findings in specimens from respiratory organs and thorax: Secondary | ICD-10-CM | POA: Diagnosis not present

## 2021-10-23 DIAGNOSIS — R9401 Abnormal electroencephalogram [EEG]: Secondary | ICD-10-CM | POA: Diagnosis not present

## 2021-10-23 DIAGNOSIS — Z4682 Encounter for fitting and adjustment of non-vascular catheter: Secondary | ICD-10-CM | POA: Diagnosis not present

## 2021-10-23 DIAGNOSIS — R091 Pleurisy: Secondary | ICD-10-CM | POA: Diagnosis not present

## 2021-10-23 DIAGNOSIS — R4182 Altered mental status, unspecified: Secondary | ICD-10-CM | POA: Diagnosis not present

## 2021-10-23 DIAGNOSIS — I6389 Other cerebral infarction: Secondary | ICD-10-CM | POA: Diagnosis not present

## 2021-10-23 DIAGNOSIS — J90 Pleural effusion, not elsewhere classified: Secondary | ICD-10-CM | POA: Diagnosis not present

## 2021-10-23 DIAGNOSIS — J9811 Atelectasis: Secondary | ICD-10-CM | POA: Diagnosis not present

## 2021-10-23 DIAGNOSIS — G931 Anoxic brain damage, not elsewhere classified: Secondary | ICD-10-CM | POA: Diagnosis not present

## 2021-10-23 DIAGNOSIS — J984 Other disorders of lung: Secondary | ICD-10-CM | POA: Diagnosis not present

## 2021-10-23 DIAGNOSIS — I639 Cerebral infarction, unspecified: Secondary | ICD-10-CM | POA: Diagnosis not present

## 2021-10-23 DIAGNOSIS — G9340 Encephalopathy, unspecified: Secondary | ICD-10-CM | POA: Diagnosis not present

## 2021-10-23 LAB — CSF CELL COUNT WITH DIFFERENTIAL
Lymphs, CSF: 19 % — ABNORMAL LOW (ref 40–80)
Lymphs, CSF: 26 % — ABNORMAL LOW (ref 40–80)
Monocyte-Macrophage-Spinal Fluid: 1 % — ABNORMAL LOW (ref 15–45)
Monocyte-Macrophage-Spinal Fluid: 4 % — ABNORMAL LOW (ref 15–45)
RBC Count, CSF: 277 /mm3 — ABNORMAL HIGH
RBC Count, CSF: 715 /mm3 — ABNORMAL HIGH
Segmented Neutrophils-CSF: 70 % — ABNORMAL HIGH (ref 0–6)
Segmented Neutrophils-CSF: 80 % — ABNORMAL HIGH (ref 0–6)
Tube #: 1
Tube #: 4
WBC, CSF: 17 /mm3 (ref 0–5)
WBC, CSF: 6 /mm3 — ABNORMAL HIGH (ref 0–5)

## 2021-10-23 LAB — GLUCOSE, CAPILLARY
Glucose-Capillary: 114 mg/dL — ABNORMAL HIGH (ref 70–99)
Glucose-Capillary: 120 mg/dL — ABNORMAL HIGH (ref 70–99)
Glucose-Capillary: 121 mg/dL — ABNORMAL HIGH (ref 70–99)
Glucose-Capillary: 126 mg/dL — ABNORMAL HIGH (ref 70–99)
Glucose-Capillary: 126 mg/dL — ABNORMAL HIGH (ref 70–99)
Glucose-Capillary: 129 mg/dL — ABNORMAL HIGH (ref 70–99)
Glucose-Capillary: 131 mg/dL — ABNORMAL HIGH (ref 70–99)

## 2021-10-23 LAB — MENINGITIS/ENCEPHALITIS PANEL (CSF)

## 2021-10-23 LAB — BASIC METABOLIC PANEL
Anion gap: 12 (ref 5–15)
BUN: 34 mg/dL — ABNORMAL HIGH (ref 6–20)
CO2: 24 mmol/L (ref 22–32)
Calcium: 8.8 mg/dL — ABNORMAL LOW (ref 8.9–10.3)
Chloride: 107 mmol/L (ref 98–111)
Creatinine, Ser: 0.87 mg/dL (ref 0.61–1.24)
GFR, Estimated: >60 mL/min (ref >60)
Glucose, Bld: 130 mg/dL — ABNORMAL HIGH (ref 70–99)
Potassium: 4 mmol/L (ref 3.5–5.1)
Sodium: 143 mmol/L (ref 135–145)

## 2021-10-23 LAB — CBC
HCT: 37.3 % — ABNORMAL LOW (ref 39.0–52.0)
Hemoglobin: 12.3 g/dL — ABNORMAL LOW (ref 13.0–17.0)
MCH: 31.9 pg (ref 26.0–34.0)
MCHC: 33 g/dL (ref 30.0–36.0)
MCV: 96.6 fL (ref 80.0–100.0)
Platelets: 583 10*3/uL — ABNORMAL HIGH (ref 150–400)
RBC: 3.86 MIL/uL — ABNORMAL LOW (ref 4.22–5.81)
RDW: 14.3 % (ref 11.5–15.5)
WBC: 17.4 10*3/uL — ABNORMAL HIGH (ref 4.0–10.5)
nRBC: 0 % (ref 0.0–0.2)

## 2021-10-23 LAB — ECHOCARDIOGRAM COMPLETE BUBBLE STUDY
Area-P 1/2: 3.89 cm2
S' Lateral: 4.1 cm

## 2021-10-23 LAB — HIV ANTIBODY (ROUTINE TESTING W REFLEX): HIV Screen 4th Generation wRfx: NONREACTIVE

## 2021-10-23 LAB — RPR: RPR Ser Ql: NONREACTIVE

## 2021-10-23 LAB — PROTEIN AND GLUCOSE, CSF
Glucose, CSF: 71 mg/dL — ABNORMAL HIGH (ref 40–70)
Total  Protein, CSF: 53 mg/dL — ABNORMAL HIGH (ref 15–45)

## 2021-10-23 LAB — MAGNESIUM: Magnesium: 2.5 mg/dL — ABNORMAL HIGH (ref 1.7–2.4)

## 2021-10-23 LAB — PHOSPHORUS: Phosphorus: 3.5 mg/dL (ref 2.5–4.6)

## 2021-10-23 MED ORDER — MIDAZOLAM HCL 2 MG/2ML IJ SOLN
INTRAMUSCULAR | Status: AC
  Administered 2021-10-23: 2 mg
  Filled 2021-10-23: qty 2

## 2021-10-23 MED ORDER — ENOXAPARIN SODIUM 80 MG/0.8ML IJ SOSY
1.0000 mg/kg | PREFILLED_SYRINGE | Freq: Two times a day (BID) | INTRAMUSCULAR | Status: DC
  Administered 2021-10-23 – 2021-10-27 (×8): 80 mg via SUBCUTANEOUS
  Filled 2021-10-23 (×8): qty 0.8

## 2021-10-23 MED ORDER — IOHEXOL 350 MG/ML SOLN
100.0000 mL | Freq: Once | INTRAVENOUS | Status: AC | PRN
  Administered 2021-10-23: 75 mL via INTRAVENOUS

## 2021-10-23 NOTE — Progress Notes (Signed)
LTM maint complete - no skin breakdown under:GRND, A1, F4 no skin breakdown.

## 2021-10-23 NOTE — Progress Notes (Signed)
Echocardiogram 2D Echocardiogram has been performed.  [name] RDCS 10/23/2021, 2:49 PM

## 2021-10-23 NOTE — Procedures (Signed)
Lumbar Puncture Procedure Note  [name]  [id number]  11/17/1965  Date:10/23/21  Time:10:34 AM   Provider Performing:Lasheka Kempner  G Lorana Maffeo   Procedure: Lumbar Puncture (34193)  Indication(s) Rule out meningitis  Consent Risks of the procedure as well as the alternatives and risks of each were explained to the patient and/or caregiver.  Consent for the procedure was obtained and is signed in the bedside chart  Anesthesia Topical only with 1% lidocaine    Time Out Verified patient identification, verified procedure, site/side was marked, verified correct patient position, special equipment/implants available, medications/allergies/relevant history reviewed, required imaging and test results available.   Sterile Technique Maximal sterile technique including sterile barrier drape, hand hygiene, sterile gown, sterile gloves, mask, hair covering.    Procedure Description Using palpation, approximate location of L3-L4 space identified.   Lidocaine used to anesthetize skin and subcutaneous tissue overlying this area.  A 20g spinal needle was then used to access the subarachnoid space. Opening pressure:Not obtained. Closing pressure:Not obtained. 10 cc CSF obtained.  Complications/Tolerance None; patient tolerated the procedure well.   EBL Minimal   Specimen(s) CSF

## 2021-10-23 NOTE — Progress Notes (Addendum)
NAME:  [name], MRN:  [id number], DOB:  Sep 11, 1965, LOS: 2 ADMISSION DATE:  10/21/2021, CONSULTATION DATE:  10/23/21 REFERRING MD:  Estelle Grumbles, CHIEF COMPLAINT:  anoxic injury   History of Present Illness:  56 year old male with past medical history significant for polysubstance abuse, hepatitis C, and GERD who presented to [place] in [place] on 9/6 after being found down.  He was last known well at approximately 1300 hrs. that day, however, EMS was called for wellness check at some time later on.  He was found to be down and unresponsive.  Upon EMS evaluation the patient was apneic with a thready pulse.  He had no response to intranasal Narcan.  He was bagged in route to the emergency department at which he arrived around 2300 hrs.  He was intubated immediately upon arrival to the ED.  CT of the head demonstrated cerebral edema without loss of white matter differentiation.  Repeat CT on 9/7 was read as normal by radiology, however, the neurologist still remained concerned for anoxic injury and recommended transfer to tertiary center for MRI, as the sending facility does not have ventilators or IV pumps compatible with MRI.  The patient was unable to transfer due to lack of available ICU beds.  The patient had been unable to wean from sedation due to significant agitation.  He would have nonpurposeful movement in the left upper and bilateral lower extremities.  No movement has been seen in his right upper extremity since presentation.  On 9/11 he was noted to have upper extremity edema on the right.  Ultrasound demonstrated acute thrombus in the mid and distal basilic vein.  Course also complicated by aspiration pneumonia for which he has completed a course of antibiotics.    On 9/13 he was transferred to [facility].   Pertinent  Medical History  GERD Head injury Hepatitis C Hiatal hernia Cocaine use (per daughter's report)  Significant Hospital Events: Including procedures,  antibiotic start and stop dates in addition to other pertinent events   9/6 found down > present to [facility] ED. CT with bilateral cerebellar edema. 9/7 repeat CT normal Unable to be weaned from sedation 9/11 RUE DVT started on tx dose lovenox 9/13 transfer to [facility] for MRI 9/14 chest tube placed with intrapleural fibrinolytic administration    Interim History / Subjective:  No acute events overnight. Afebrile since yesterday evening (after Tylenol administration) but continues to be tachycardic and tachypneic. Remains on ventilator. Making urine. Chest tube putting out serosanguinous fluid.   Objective   Blood pressure 134/87, pulse (!) 110, temperature 99.7 F (37.6 C), resp. rate (!) 22, height 5\' 10"  (1.778 m), weight 80.2 kg, SpO2 99 %.    Vent Mode: PRVC FiO2 (%):  [30 %-40 %] 40 % Set Rate:  [18 bmp] 18 bmp Vt Set:  [550 mL] 550 mL PEEP:  [5 cmH20] 5 cmH20 Plateau Pressure:  [18 cmH20-19 cmH20] 19 cmH20   Intake/Output Summary (Last 24 hours) at 10/23/2021 0728 Last data filed at 10/23/2021 0600 Gross per 24 hour  Intake 603.25 ml  Output 3130 ml  Net -2526.75 ml   Filed Weights   10/21/21 2300 10/22/21 0400 10/23/21 0400  Weight: 80.9 kg 80.9 kg 80.2 kg    Examination: General: Ill appearing but lying in bed with no acute distress Neuro: Inconsistently opens eyes for 1-2 seconds to auditory stimulation but not reproducible. Does not follow commands. Does not withdraw to pain. Spontaneous, non-purposeful movements of bilateral lower extremities.  HENT: [place]/AT. PEERL. ETT and OGT in place Lungs: On ventilator. Breathing comfortably without recruitment of accessory muscles. Breath sounds diminished over right base and mild rhonchi heard throughout. No wheezes or crackles. Chest tube in place with 1+ air leak Cardiovascular: Tachycardic without murmurs, rubs, or gallops Abdomen: Soft, NT/ND with normoactive bowel sounds Extremities: WWP GU: Foley in place   Ancillary tests   WBC 17.4 Hb 12.3 Platelets 583 BUN 34, Cr 0.87 Glucose 120s Hb A1c 5.5% Lipid panel: TC 161, TG 238, HDL 16, LDL 97 Vitamin B12, folate wnl TSH wnl Ammonia 48  Pleural fluid / serum protein ratio 0.59 Pleural fluid / serum LDH ratio 2.09 Pleural fluid cell count with 1110 nucleated cells, 48% PMNs. Pleural fluid gram stain negative, culture pending  Pleural fluid cytology pending Pleural fluid glucose 107, albumin 1.8, amylase 29  Sputum gram stain with gram negative rods, culture pending  Blood cultures with NGTD  MRI brain: multifocal, primarily small acute infarcts involving bilateral cerebral hemispheres. Bilateral cerebellar vasogenic edema without mass effect.   CTA notable for acute / early subacute infarct within the right frontal operculum. Negative for large vessel occlusion or high-grade stenosis of intracranial arteries.    CT venogram negative for dural venous thrombosis  CXR with mild interval worsening of left lower lobe opacities. Chest tube in place.   Overnight EEG negative   Assessment & Plan:  56 year old male with history of drug use found unresponsive and apneic at home, admitted with acute encephalopathy of unclear etiology.   Acute encephalopathy  Initial CT head notable for vasogenic edema in bilateral cerebellar hemispheres without loss of gray-white matter differentiation, repeat normal. MRI with evidence of multiple cerebral infarcts and cerebellar vasogenic edema. 24 hour EEG negative for epileptiform activity. BUN and ammonia mildly elevated. Differential includes anoxic brain injury vs CNS infection vs toxic / metabolic encephalopathy.  - Volatile organic panel and carbon monoxide panel pending - Neurology following - Thiamine - LP 9/15 - Supportive care and neuro-protective measures in the ICU.  - RASS goal 0 to -1. Can start precedex if needed - Continue Zyprexa 5mg   - Can consider lactulose if not improving   Right sided aspiration  pneumonia with parapneumonic effusion  Ongoing hemodynamic instability despite recent completed course of ceftriaxone and azithromycin. CXR and beside ultrasound with evidence of right basilar airspace disease and right sided pleural effusion with septations, incompletely drained despite chest tube placement with administration of intrapleural fibrinolytics. Continues to be ventilator-dependent due to encephalopathy.  - Continue zosyn  - Follow blood cultures - Sputum culture pending  - Pleural fluid culture / cytology pending  - Chest tube to water seal given 1+ air leak. Suspect bronchopleural fistula due to empyema - May need chest tube replaced in the next 1-2 days - Repeat CXR this afternoon to evaluate for possible pneumothorax   Acute hypoxemic respiratory failure secondary to aspiration pneumonia Currently on minimal support but still ventilator dependent due to encephalopathy. ABG within normal limits. - Full vent support - SAT/SBT as tolerated - Consider early trach - Address encephalopathy as above  Multifocal cerebral infarcts  MRI with multifocal, primarily small acute infarcts involving bilateral cerebral hemispheres and bilateral cerebellar vasogenic edema without mass effect. Lipid panel with TC 161, TG 238, HDL 16, LDL 97. Hb A1c within normal limits. CTA notable for acute / early subacute infarct within the right frontal operculum but negative for large vessel occlusion. Etiology likely vasospastic infarcts in the setting of  cocaine use. Unclear significance of cerebellar vasogenic edema.  - TTE pending - Will ultimately need statin therapy to target LDL <70  Superficial vein thrombosis Diagnosed 9/11 with R basilic vein DVT - Hold lovenox, can resume after LP    Protein calorie malnutrition - RD consult - Electrolyte repletion PRN   Mild transaminitis  Hepatitis C AST 65, ALT 94. Ammonia 48. - Continue to monitor  GERD - Continue PPI  Best Practice (right  click and "Reselect all SmartList Selections" daily)   Diet/type: tubefeeds DVT prophylaxis: hold for possible LP GI prophylaxis: PPI Lines: N/A Foley:  Yes, and it is still needed Code Status:  full code Last date of multidisciplinary goals of care discussion [9/15]  Labs   CBC: Recent Labs  Lab 10/21/21 2335 10/22/21 0049 10/23/21 0619  WBC  --  20.5* 17.4*  HGB 11.2* 8.8* 12.3*  HCT 33.0* 26.3* 37.3*  MCV  --  98.9 96.6  PLT  --  619* 583*    Basic Metabolic Panel: Recent Labs  Lab 10/21/21 2335 10/22/21 0049 10/23/21 0619  NA 141 140 143  K 3.9 4.1 4.0  CL  --  107 107  CO2  --  27 24  GLUCOSE  --  120* 130*  BUN  --  30* 34*  CREATININE  --  1.10 0.87  CALCIUM  --  8.5* 8.8*  MG  --  2.7* 2.5*  PHOS  --  4.1 3.5   GFR: Estimated Creatinine Clearance: 96.7 mL/min (by C-G formula based on SCr of 0.87 mg/dL). Recent Labs  Lab 10/22/21 0049 10/23/21 0619  PROCALCITON 0.58  --   WBC 20.5* 17.4*    Liver Function Tests: Recent Labs  Lab 10/22/21 0049  AST 65*  ALT 94*  ALKPHOS 186*  BILITOT 0.8  PROT 7.1  ALBUMIN 2.0*   No results for input(s): "LIPASE", "AMYLASE" in the last 168 hours. Recent Labs  Lab 10/22/21 1151  AMMONIA 48*    ABG    Component Value Date/Time   PHART 7.444 10/21/2021 2335   PCO2ART 38.3 10/21/2021 2335   PO2ART 85 10/21/2021 2335   HCO3 26.1 10/21/2021 2335   TCO2 27 10/21/2021 2335   O2SAT 96 10/21/2021 2335     Coagulation Profile: Recent Labs  Lab 10/22/21 0049  INR 1.0    Cardiac Enzymes: No results for input(s): "CKTOTAL", "CKMB", "CKMBINDEX", "TROPONINI" in the last 168 hours.  HbA1C: Hgb A1c MFr Bld  Date/Time Value Ref Range Status  10/22/2021 08:02 PM 5.5 4.8 - 5.6 % Final    Comment:    (NOTE) Pre diabetes:          5.7%-6.4%  Diabetes:              >6.4%  Glycemic control for   <7.0% adults with diabetes   02/14/2019 09:11 AM 5.5 4.8 - 5.6 % Final    Comment:              Prediabetes: 5.7 - 6.4          Diabetes: >6.4          Glycemic control for adults with diabetes: <7.0     CBG: Recent Labs  Lab 10/22/21 1133 10/22/21 1653 10/22/21 1952 10/22/21 2359 10/23/21 0340  GLUCAP 112* 126* 129* 126* 129*    Review of Systems:   Unable to perform due to encephalopathy  Past Medical History:  He,  has a past medical history of Acid reflux,  Head injury, Hepatitis C, and Hiatal hernia.   Surgical History:   Past Surgical History:  Procedure Laterality Date   ACROMIO-CLAVICULAR JOINT REPAIR Right 06/28/2018   Procedure: ACROMIO-CLAVICULAR JOINT IRRIGATION AND DEBRIDEMENT;  Surgeon: Cammy Copa, MD;  Location: [facility] OR;  Service: Orthopedics;  Laterality: Right;   FACIAL FRACTURE SURGERY     IRRIGATION AND DEBRIDEMENT SHOULDER Right 06/28/2018   Procedure: IRRIGATION AND DEBRIDEMENT SHOULDER;  Surgeon: Cammy Copa, MD;  Location[facility] OR;  Service: Orthopedics;  Laterality: Right;   KNEE ARTHROSCOPY Left 06/23/2018   Procedure: LEFT KNEE ARTHROSCOPY KNEE I&D.;  Surgeon: Cammy Copa, MD;  Location: [facility] OR;  Service: Orthopedics;  Laterality: Left;     Social History:   reports that he has been smoking cigarettes. He has a 20.00 pack-year smoking history. He has never used smokeless tobacco. He reports current alcohol use. He reports that he does not currently use drugs after having used the following drugs: Cocaine.   Family History:  His family history includes Diabetes in his mother; Hypertension in his father and mother.   Allergies No Known Allergies   Home Medications  Prior to Admission medications   Medication Sig Start Date End Date Taking? Authorizing Provider  acetaminophen (TYLENOL) 160 MG/5ML elixir Place 15 mg/kg into feeding tube every 6 (six) hours as needed for fever or pain.   Yes [provider]  Alcohol, USP (ETHYL ALCOHOL) 95 % SOLN Place 1 Swab into the nose 2 (two) times daily. Swab each nostril twice  daily   Yes [provider]  chlordiazePOXIDE (LIBRIUM) 25 MG capsule Place 25 mg into feeding tube 3 (three) times daily.   Yes [provider]  chlorhexidine (PERIDEX) 0.12 % solution Use as directed 15 mLs in the mouth or throat 2 (two) times daily.   Yes [provider]  DEXMEDETOMIDINE HCL IV Inject 100 mLs into the vein See admin instructions. MAR from [facility] does not include concentration: Dexmedetomidin every 6 hours and 54 minutes, IV drip   Yes [provider]  enoxaparin (LOVENOX) 80 MG/0.8ML injection Inject 80 mg into the skin daily.   Yes [provider]  folic acid (FOLVITE) 1 MG tablet Place 1 mg into feeding tube daily.   Yes [provider]  Multiple Vitamin (MULTIVITAMIN) tablet Place 1 tablet into feeding tube daily.   Yes [provider]  OLANZapine (ZYPREXA) 5 MG tablet Place 5 mg into feeding tube 2 (two) times daily.   Yes [provider]  pantoprazole (PROTONIX) 40 MG injection Inject 40 mg into the vein daily.   Yes [provider]  propofol (DIPRIVAN) 1000 MG/100ML EMUL injection Inject 1,000 mg into the vein See admin instructions. Propofol 1000 mg / 100 ml at 10.031 ml/hr over 9 hours 59 minutes   Yes [provider]  scopolamine (TRANSDERM-SCOP) 1 MG/3DAYS Place 1 patch onto the skin every 3 (three) days.   Yes [provider]  Sofosbuvir-Velpatasvir (EPCLUSA) 400-100 MG TABS Take 1 tablet by mouth daily. 10/11/18  Yes Cliffton Asters, MD  thiamine (VITAMIN B1) 100 MG tablet Place 100 mg into feeding tube daily.   Yes [provider]     Critical care time: 35 minutes

## 2021-10-23 NOTE — Progress Notes (Signed)
Pt transported by RN, RT, and Transport from 2M10 to CT3 and back w/o complications.

## 2021-10-23 NOTE — Progress Notes (Addendum)
Attending Neurologist's note:   I personally saw this patient, gathering history, performing a full neurologic examination, reviewing relevant labs, personally reviewing relevant imaging, and formulated the assessment and plan, adding the note above for completeness and clarity to accurately reflect my findings and recommendations. On my examination patient will not open his eyes to command but will track me if I open them for him. He is moving BLE spontaneously for me but not BUE. He will sometimes appear to wiggle toes on his LLE to command but this was poorly reproducible and he also wiggles them when he is not asked to. LP performed this AM. Tube 1 showed 277 RBC and 17 WBC, tube 4 showed 715 RBC 6 WBC, neutrophil predominant. Meningitis PCR panel was negative. Recommend CCM touch base with ID regarding the pleiocytosis to get their thoughts and any further recommendations. Will continue LTM EEG until tomorrow, can d/c in AM if no seizures overnight. Will continue to follow.  This patient is critically ill and at significant risk of neurological worsening, death and care requires constant monitoring of vital signs, hemodynamics,respiratory and cardiac monitoring, neurological assessment, discussion with family, other specialists and medical decision making of high complexity. I spent 45 minutes of neurocritical care time  in the care of  this patient. This was time spent independent of any time provided by nurse practitioner or PA.  Bing Neighbors, MD [facility] [phone]  If 7pm- 7am, please page neurology on call as listed in AMION.   Bing Neighbors, MD [facility] [phone]  If 7pm- 7am, please page neurology on call as listed in AMION.  Neurology Progress Note  Brief HPI: Patient with history of polysubstance abuse, hepatitis C and GER presented to outside hospital on 9/6 after bring found unresponsive on a wellness check.  He was intubated for airway  protection.  He was transferred here on 9/13 for MRI and found to have small acute infarcts in bilateral hemispheres with the largest in the right frontal lobe as well as bilateral abnormal cerebellar signal possibly representing mild vasogenic edema.  He was also found to have a right upper extremity DVT and is anticoagulated with Lovenox.  Chest tube was inserted for pleural effusion.  Subjective: Patient has been hemodynamically stable overnight.  LP performed by CCM today.  Per RN, he will not follow commands but will move LUE but not RUE to noxious stimuli  Exam: Vitals:   10/23/21 1100 10/23/21 1144  BP: (!) 152/92 127/83  Pulse: (!) 107 (!) 109  Resp: (!) 23 (!) 24  Temp: 99.7 F (37.6 C) 99.9 F (37.7 C)  SpO2: 99% 98%   Gen: In bed, intubated, no continuous sedation, NAD Resp: respirations synchronous with ventilator  Neuro (intubated without sedation): Does not follow commands or respond to voice/touch.  Will move BLE spontaneously and nonpurposefully. No movement of BUE to noxious stimuli. PERRL, intact oculocephalic reflex.  Cough intact  Pertinent Labs: CBC    Component Value Date/Time   WBC 17.4 (H) 10/23/2021 0619   RBC 3.86 (L) 10/23/2021 0619   HGB 12.3 (L) 10/23/2021 0619   HCT 37.3 (L) 10/23/2021 0619   PLT 583 (H) 10/23/2021 0619   MCV 96.6 10/23/2021 0619   MCH 31.9 10/23/2021 0619   MCHC 33.0 10/23/2021 0619   RDW 14.3 10/23/2021 0619   LYMPHSABS 2.0 09/05/2018 0257   MONOABS 0.5 09/05/2018 0257   EOSABS 0.1 09/05/2018 0257   BASOSABS 0.1 09/05/2018 0257       Latest  Ref Rng & Units 10/23/2021    6:19 AM 10/22/2021   12:49 AM 10/21/2021   11:35 PM  BMP  Glucose 70 - 99 mg/dL 373  428    BUN 6 - 20 mg/dL 34  30    Creatinine 7.68 - 1.24 mg/dL 1.15  7.26    Sodium 203 - 145 mmol/L 143  140  141   Potassium 3.5 - 5.1 mmol/L 4.0  4.1  3.9   Chloride 98 - 111 mmol/L 107  107    CO2 22 - 32 mmol/L 24  27    Calcium 8.9 - 10.3 mg/dL 8.8  8.5       Lipid Panel     Component Value Date/Time   CHOL 161 10/22/2021 2002   TRIG 238 (H) 10/22/2021 2002   HDL 16 (L) 10/22/2021 2002   CHOLHDL 10.1 10/22/2021 2002   VLDL 48 (H) 10/22/2021 2002   LDLCALC 97 10/22/2021 2002   A1C 5.5 Imaging Reviewed:  MRI brain: Multifocal small acute infarcts in bilateral cerebral hemispheres with larger infarct in right frontal lobe.  Bilateral abnormal cerebellar signal possibly reflecting mild vasogenic edema  CTA head and neck: No emergent LVO or high grade stenosis of intracranial arteries  CTV head - WNL  EEG 9/15 No seizures or epileptiform discharges, study suggestive of severe diffuse encephalopathy  Assessment: 56 year old patient with history of polysubstance abuse, hepatitis C and GERD presented to outside hospital after being found unresponsive, was intubated and brought here for MRI.  He was found to have multiple acute infarcts in bilateral cerebral hemispheres and is still minimally responsive.  CTA shows no LVO or significant stenosis, and strokes do not explain patient's decreased level of responsiveness. LP performed today by CCM to evaluate for infectious causes of altered mental status.  Very little information available about the incident that led to his hospitalization. Daughter is estranged. UDS positive for cocaine. High suspicion for anoxic brain injury. MRI Brain demonstrates mild BL vasogenic cerebellar edema(?cocaine leukoencephalopathy vs hypomagnesemia, less likely CHANTERs) along with BL embolic appearing infarct which do not seem to be in watershed distribution. These findings are very atypical for anoxic injury which is our leading diagnosis.   Impression: Acute toxic / metabolic encephalopathy vs. encephalitis in patient with acute ischemic strokes, likely cardioembolic  Recommendations: 1)Consider discontinuing LTM EEG 2)Avoid deliriogenic medications and sedation if possible 3)Care of comorbidities per CCM 4)Continue  stroke workup with echocardiogram  5) CT venogram of head 6)Continue high dose thiamine  Cortney E Ernestina Columbia , MSN, AGACNP-BC [facility] See Amion for schedule and pager information 10/23/2021 12:50 PM

## 2021-10-23 NOTE — Progress Notes (Signed)
Evening rounds  No events, LP essentially benign.  Ongoing cEEG.  Pigtail probably needs to remain until air leak improved.  Unclear prognosis at this point but imaging does not look devastating.  Myrla Halsted MD PCCM

## 2021-10-23 NOTE — Procedures (Signed)
Patient Name: Tyeler Goedken  MRN: [id number]  Epilepsy Attending: Charlsie Quest  Referring Physician/Provider: Jefferson Fuel, MD  Duration: 10/22/2021 1612 to 10/23/2021 1612   Patient history: 56yo m with ams. EEG to evaluate to seizure   Level of alertness: lethargic    AEDs during EEG study: None   Technical aspects: This EEG study was done with scalp electrodes positioned according to the 10-20 International system of electrode placement. Electrical activity was reviewed with band pass filter of 1-70Hz , sensitivity of 7 uV/mm, display speed of 67mm/sec with a 60Hz  notched filter applied as appropriate. EEG data were recorded continuously and digitally stored.  Video monitoring was available and reviewed as appropriate.   Description: EEG showed continuous generalized  mixed frequencies with predominantly 6-9Hz  theta-alpha activity as well as intermittent 2-3Hz  delta slowing admixed with 12-14Hz  generalized beta activity. Hyperventilation and photic stimulation were not performed.      ABNORMALITY - Continuous slow, generalized   IMPRESSION: This study is suggestive of severe diffuse encephalopathy, nonspecific etiology. No seizures or epileptiform discharges were seen throughout the recording.   Abdulah Iqbal 

## 2021-10-24 ENCOUNTER — Inpatient Hospital Stay (HOSPITAL_COMMUNITY): Payer: Medicaid Other

## 2021-10-24 DIAGNOSIS — G931 Anoxic brain damage, not elsewhere classified: Secondary | ICD-10-CM | POA: Diagnosis not present

## 2021-10-24 DIAGNOSIS — R9389 Abnormal findings on diagnostic imaging of other specified body structures: Secondary | ICD-10-CM | POA: Diagnosis not present

## 2021-10-24 DIAGNOSIS — I639 Cerebral infarction, unspecified: Secondary | ICD-10-CM | POA: Diagnosis not present

## 2021-10-24 DIAGNOSIS — R4182 Altered mental status, unspecified: Secondary | ICD-10-CM | POA: Diagnosis not present

## 2021-10-24 DIAGNOSIS — G9340 Encephalopathy, unspecified: Secondary | ICD-10-CM | POA: Diagnosis not present

## 2021-10-24 DIAGNOSIS — J9811 Atelectasis: Secondary | ICD-10-CM | POA: Diagnosis not present

## 2021-10-24 DIAGNOSIS — R9401 Abnormal electroencephalogram [EEG]: Secondary | ICD-10-CM | POA: Diagnosis not present

## 2021-10-24 LAB — PHOSPHORUS: Phosphorus: 4 mg/dL (ref 2.5–4.6)

## 2021-10-24 LAB — MAGNESIUM: Magnesium: 2.5 mg/dL — ABNORMAL HIGH (ref 1.7–2.4)

## 2021-10-24 LAB — HEPATIC FUNCTION PANEL
ALT: 86 U/L — ABNORMAL HIGH (ref 0–44)
AST: 46 U/L — ABNORMAL HIGH (ref 15–41)
Albumin: 2.2 g/dL — ABNORMAL LOW (ref 3.5–5.0)
Alkaline Phosphatase: 166 U/L — ABNORMAL HIGH (ref 38–126)
Bilirubin, Direct: 0.2 mg/dL (ref 0.0–0.2)
Indirect Bilirubin: 0.4 mg/dL (ref 0.3–0.9)
Total Bilirubin: 0.6 mg/dL (ref 0.3–1.2)
Total Protein: 7.2 g/dL (ref 6.5–8.1)

## 2021-10-24 LAB — BASIC METABOLIC PANEL
Anion gap: 14 (ref 5–15)
BUN: 37 mg/dL — ABNORMAL HIGH (ref 6–20)
CO2: 21 mmol/L — ABNORMAL LOW (ref 22–32)
Calcium: 8.8 mg/dL — ABNORMAL LOW (ref 8.9–10.3)
Chloride: 108 mmol/L (ref 98–111)
Creatinine, Ser: 0.87 mg/dL (ref 0.61–1.24)
GFR, Estimated: >60 mL/min (ref >60)
Glucose, Bld: 117 mg/dL — ABNORMAL HIGH (ref 70–99)
Potassium: 4.2 mmol/L (ref 3.5–5.1)
Sodium: 143 mmol/L (ref 135–145)

## 2021-10-24 LAB — GLUCOSE, CAPILLARY
Glucose-Capillary: 116 mg/dL — ABNORMAL HIGH (ref 70–99)
Glucose-Capillary: 109 mg/dL — ABNORMAL HIGH (ref 70–99)
Glucose-Capillary: 121 mg/dL — ABNORMAL HIGH (ref 70–99)
Glucose-Capillary: 107 mg/dL — ABNORMAL HIGH (ref 70–99)
Glucose-Capillary: 126 mg/dL — ABNORMAL HIGH (ref 70–99)

## 2021-10-24 LAB — CBC
HCT: 36.7 % — ABNORMAL LOW (ref 39.0–52.0)
Hemoglobin: 12.2 g/dL — ABNORMAL LOW (ref 13.0–17.0)
MCH: 32.4 pg (ref 26.0–34.0)
MCHC: 33.2 g/dL (ref 30.0–36.0)
MCV: 97.6 fL (ref 80.0–100.0)
Platelets: 653 10*3/uL — ABNORMAL HIGH (ref 150–400)
RBC: 3.76 MIL/uL — ABNORMAL LOW (ref 4.22–5.81)
RDW: 14.1 % (ref 11.5–15.5)
WBC: 19.6 10*3/uL — ABNORMAL HIGH (ref 4.0–10.5)
nRBC: 0 % (ref 0.0–0.2)

## 2021-10-24 LAB — AMMONIA: Ammonia: 32 umol/L (ref 9–35)

## 2021-10-24 MED ORDER — LACTULOSE 10 GM/15ML PO SOLN
20.0000 g | Freq: Every day | ORAL | Status: DC
  Administered 2021-10-24 – 2021-10-25 (×2): 20 g via ORAL
  Filled 2021-10-24 (×2): qty 30

## 2021-10-24 NOTE — Progress Notes (Signed)
NAME:  [name], MRN:  [id number], DOB:  November 29, 1965, LOS: 3 ADMISSION DATE:  10/21/2021, CONSULTATION DATE:  10/23/21 REFERRING MD:  Estelle Grumbles, CHIEF COMPLAINT:  anoxic injury   History of Present Illness:  56 year old male with past medical history significant for polysubstance abuse, hepatitis C, and GERD who presented to [facility] in [place] on 9/6 after being found down.  He was last known well at approximately 1300 hrs. that day, however, EMS was called for wellness check at some time later on.  He was found to be down and unresponsive.  Upon EMS evaluation the patient was apneic with a thready pulse.  He had no response to intranasal Narcan.  He was bagged in route to the emergency department at which he arrived around 2300 hrs.  He was intubated immediately upon arrival to the ED.  CT of the head demonstrated cerebral edema without loss of white matter differentiation.  Repeat CT on 9/7 was read as normal by radiology, however, the neurologist still remained concerned for anoxic injury and recommended transfer to tertiary center for MRI, as the sending facility does not have ventilators or IV pumps compatible with MRI.  The patient was unable to transfer due to lack of available ICU beds.  The patient had been unable to wean from sedation due to significant agitation.  He would have nonpurposeful movement in the left upper and bilateral lower extremities.  No movement has been seen in his right upper extremity since presentation.  On 9/11 he was noted to have upper extremity edema on the right.  Ultrasound demonstrated acute thrombus in the mid and distal basilic vein.  Course also complicated by aspiration pneumonia for which he has completed a course of antibiotics.    On 9/13 he was transferred to [facility].   Pertinent  Medical History  GERD Head injury Hepatitis C Hiatal hernia Cocaine use (per daughter's report)  Significant Hospital Events: Including procedures,  antibiotic start and stop dates in addition to other pertinent events   9/6 found down > present to [facility] ED. CT with bilateral cerebellar edema. 9/7 repeat CT normal Unable to be weaned from sedation 9/11 RUE DVT started on tx dose lovenox 9/13 transfer to [facility] for MRI 9/14 chest tube placed with intrapleural fibrinolytic administration    Interim History / Subjective:  No acute events overnight. Afebrile since yesterday evening (after Tylenol administration) but continues to be tachycardic and tachypneic. Remains on ventilator. Making urine. Chest tube putting out serosanguinous fluid.   Objective   Blood pressure 128/86, pulse (!) 105, temperature 99.7 F (37.6 C), resp. rate (!) 21, height 5\' 10"  (1.778 m), weight 76.9 kg, SpO2 95 %.    Vent Mode: PRVC FiO2 (%):  [40 %] 40 % Set Rate:  [18 bmp] 18 bmp Vt Set:  [550 mL] 550 mL PEEP:  [5 cmH20-10 cmH20] 5 cmH20 Plateau Pressure:  [16 cmH20-18 cmH20] 16 cmH20   Intake/Output Summary (Last 24 hours) at 10/24/2021 1100 Last data filed at 10/24/2021 1000 Gross per 24 hour  Intake 873.26 ml  Output 1655 ml  Net -781.74 ml   Filed Weights   10/22/21 0400 10/23/21 0400 10/24/21 0500  Weight: 80.9 kg 80.2 kg 76.9 kg    Examination: General: Middle-aged gentleman, intubated on mechanical life support critically ill Neuro: Not following commands, minimal response to stimuli, spontaneous roving movements of the extremities HENT: NCAT, endotracheal tube in place Lungs: Bilateral mechanically ventilated breath sounds Cardiovascular: Regular rhythm, S1-S2 Abdomen:  Soft nontender nondistended Extremities: No significant edema GU: Foley in place   Ancillary tests   WBC 17.4 Hb 12.3 Platelets 583 BUN 34, Cr 0.87 Glucose 120s Hb A1c 5.5% Lipid panel: TC 161, TG 238, HDL 16, LDL 97 Vitamin B12, folate wnl TSH wnl Ammonia 48  Pleural fluid / serum protein ratio 0.59 Pleural fluid / serum LDH ratio 2.09 Pleural fluid cell  count with 1110 nucleated cells, 48% PMNs. Pleural fluid gram stain negative, culture pending  Pleural fluid cytology pending Pleural fluid glucose 107, albumin 1.8, amylase 29  Sputum gram stain with gram negative rods, culture pending  Blood cultures with NGTD  MRI brain: multifocal, primarily small acute infarcts involving bilateral cerebral hemispheres. Bilateral cerebellar vasogenic edema without mass effect.   CTA notable for acute / early subacute infarct within the right frontal operculum. Negative for large vessel occlusion or high-grade stenosis of intracranial arteries.    CT venogram negative for dural venous thrombosis  CXR with mild interval worsening of left lower lobe opacities. Chest tube in place.   Overnight EEG negative   Assessment & Plan:  56 year old male with history of drug use found unresponsive and apneic at home, admitted with acute encephalopathy of unclear etiology.   Acute encephalopathy, multiple strokes, concern for anoxic injury Initial CT head notable for vasogenic edema in bilateral cerebellar hemispheres without loss of gray-white matter differentiation, repeat normal. MRI with evidence of multiple cerebral infarcts and cerebellar vasogenic edema. 24 hour EEG negative for epileptiform activity. BUN and ammonia mildly elevated.  Plan: Appreciate neurology input LP findings with RBCs, differential benign, expected WBC count elevated with RBC present, HSV PCR pending Continue thiamine supplementation Continue Zyprexa Precedex if needed Supportive care  Right sided aspiration pneumonia with parapneumonic effusion  Plan Status post chest tube placement with intrapleural lytics Continue Zosyn No plans for removal of chest tube at this time until patient off mechanical support  Acute hypoxemic respiratory failure secondary to aspiration pneumonia Currently on minimal support but still ventilator dependent due to encephalopathy. ABG within normal  limits. Plan: Full vent support SBT SAT not tolerated due to mental status. Started discussion this morning with daughter regarding potential need for prolonged mechanical vent need as well is tracheostomy  Multifocal cerebral infarcts  Etiology likely vasospastic infarcts in the setting of cocaine use. Unclear significance of cerebellar vasogenic edema.  Plan: Supportive care Appreciate neurology input Discontinued LTV EEG  Superficial vein thrombosis Diagnosed 4/09 with R basilic vein DVT Plan Resume Lovenox   Protein calorie malnutrition Plan: Continue tube feeds   Mild transaminitis  Hepatitis C AST 65, ALT 94. Ammonia 48. Plan: Lactulose daily  GERD Continue PPI  Best Practice (right click and "Reselect all SmartList Selections" daily)   Diet/type: tubefeeds DVT prophylaxis: hold for possible LP GI prophylaxis: PPI Lines: N/A Foley:  Yes, and it is still needed Code Status:  full code Last date of multidisciplinary goals of care discussion [9/15]  Labs   CBC: Recent Labs  Lab 10/21/21 2335 10/22/21 0049 10/23/21 0619 10/24/21 0651  WBC  --  20.5* 17.4* 19.6*  HGB 11.2* 8.8* 12.3* 12.2*  HCT 33.0* 26.3* 37.3* 36.7*  MCV  --  98.9 96.6 97.6  PLT  --  619* 583* 653*    Basic Metabolic Panel: Recent Labs  Lab 10/21/21 2335 10/22/21 0049 10/23/21 0619 10/24/21 0651  NA 141 140 143 143  K 3.9 4.1 4.0 4.2  CL  --  107 107 108  CO2  --  27 24 21*  GLUCOSE  --  120* 130* 117*  BUN  --  30* 34* 37*  CREATININE  --  1.10 0.87 0.87  CALCIUM  --  8.5* 8.8* 8.8*  MG  --  2.7* 2.5* 2.5*  PHOS  --  4.1 3.5 4.0   GFR: Estimated Creatinine Clearance: 96.7 mL/min (by C-G formula based on SCr of 0.87 mg/dL). Recent Labs  Lab 10/22/21 0049 10/23/21 0619 10/24/21 0651  PROCALCITON 0.58  --   --   WBC 20.5* 17.4* 19.6*    Liver Function Tests: Recent Labs  Lab 10/22/21 0049  AST 65*  ALT 94*  ALKPHOS 186*  BILITOT 0.8  PROT 7.1  ALBUMIN 2.0*    No results for input(s): "LIPASE", "AMYLASE" in the last 168 hours. Recent Labs  Lab 10/22/21 1151  AMMONIA 48*    ABG    Component Value Date/Time   PHART 7.444 10/21/2021 2335   PCO2ART 38.3 10/21/2021 2335   PO2ART 85 10/21/2021 2335   HCO3 26.1 10/21/2021 2335   TCO2 27 10/21/2021 2335   O2SAT 96 10/21/2021 2335     Coagulation Profile: Recent Labs  Lab 10/22/21 0049  INR 1.0    Cardiac Enzymes: No results for input(s): "CKTOTAL", "CKMB", "CKMBINDEX", "TROPONINI" in the last 168 hours.  HbA1C: Hgb A1c MFr Bld  Date/Time Value Ref Range Status  10/22/2021 08:02 PM 5.5 4.8 - 5.6 % Final    Comment:    (NOTE) Pre diabetes:          5.7%-6.4%  Diabetes:              >6.4%  Glycemic control for   <7.0% adults with diabetes   02/14/2019 09:11 AM 5.5 4.8 - 5.6 % Final    Comment:             Prediabetes: 5.7 - 6.4          Diabetes: >6.4          Glycemic control for adults with diabetes: <7.0     CBG: Recent Labs  Lab 10/23/21 1537 10/23/21 2031 10/23/21 2335 10/24/21 0333 10/24/21 0803  GLUCAP 126* 121* 120* 116* 109*    Review of Systems:   Unable to perform due to encephalopathy  Past Medical History:  He,  has a past medical history of Acid reflux, Head injury, Hepatitis C, and Hiatal hernia.   Surgical History:   Past Surgical History:  Procedure Laterality Date   ACROMIO-CLAVICULAR JOINT REPAIR Right 06/28/2018   Procedure: ACROMIO-CLAVICULAR JOINT IRRIGATION AND DEBRIDEMENT;  Surgeon: [name], MD;  Location: [facility];  Service: Orthopedics;  Laterality: Right;   FACIAL FRACTURE SURGERY     IRRIGATION AND DEBRIDEMENT SHOULDER Right 06/28/2018   Procedure: IRRIGATION AND DEBRIDEMENT SHOULDER;  Surgeon: [name], MD;  Location: [facility];  Service: Orthopedics;  Laterality: Right;   KNEE ARTHROSCOPY Left 06/23/2018   Procedure: LEFT KNEE ARTHROSCOPY KNEE I&D.;  Surgeon: [name], MD;  Location: [facility];  Service:  Orthopedics;  Laterality: Left;     Social History:   reports that he has been smoking cigarettes. He has a 20.00 pack-year smoking history. He has never used smokeless tobacco. He reports current alcohol use. He reports that he does not currently use drugs after having used the following drugs: Cocaine.   Family History:  His family history includes Diabetes in his mother; Hypertension in his father and mother.   Allergies  No Known Allergies   Home Medications  Prior to Admission medications   Medication Sig Start Date End Date Taking? Authorizing Provider  acetaminophen (TYLENOL) 160 MG/5ML elixir Place 15 mg/kg into feeding tube every 6 (six) hours as needed for fever or pain.   Yes [provider]  Alcohol, USP (ETHYL ALCOHOL) 95 % SOLN Place 1 Swab into the nose 2 (two) times daily. Swab each nostril twice daily   Yes [provider]  chlordiazePOXIDE (LIBRIUM) 25 MG capsule Place 25 mg into feeding tube 3 (three) times daily.   Yes [provider]  chlorhexidine (PERIDEX) 0.12 % solution Use as directed 15 mLs in the mouth or throat 2 (two) times daily.   Yes [provider]  DEXMEDETOMIDINE HCL IV Inject 100 mLs into the vein See admin instructions. MAR from [facility] does not include concentration: Dexmedetomidin 176ml every 6 hours and 54 minutes, IV drip   Yes [provider]  enoxaparin (LOVENOX) 80 MG/0.8ML injection Inject 80 mg into the skin daily.   Yes [provider]  folic acid (FOLVITE) 1 MG tablet Place 1 mg into feeding tube daily.   Yes [provider]  Multiple Vitamin (MULTIVITAMIN) tablet Place 1 tablet into feeding tube daily.   Yes [provider]  OLANZapine (ZYPREXA) 5 MG tablet Place 5 mg into feeding tube 2 (two) times daily.   Yes [provider]  pantoprazole (PROTONIX) 40 MG injection Inject 40 mg into the vein daily.   Yes [provider]  propofol (DIPRIVAN) 1000  MG/100ML EMUL injection Inject 1,000 mg into the vein See admin instructions. Propofol 1000 mg / 100 ml at 10.031 ml/hr over 9 hours 59 minutes   Yes [provider]  scopolamine (TRANSDERM-SCOP) 1 MG/3DAYS Place 1 patch onto the skin every 3 (three) days.   Yes [provider]  Sofosbuvir-Velpatasvir (EPCLUSA) 400-100 MG TABS Take 1 tablet by mouth daily. 10/11/18  Yes Michel Bickers, MD  thiamine (VITAMIN B1) 100 MG tablet Place 100 mg into feeding tube daily.   Yes [provider]    This patient is critically ill with multiple organ system failure; which, requires frequent high complexity decision making, assessment, support, evaluation, and titration of therapies. This was completed through the application of advanced monitoring technologies and extensive interpretation of multiple databases. During this encounter critical care time was devoted to patient care services described in this note for 32 minutes.   [facility] 10/24/2021 11:00 AM

## 2021-10-24 NOTE — Procedures (Addendum)
Patient Name: [name]  MRN: [id number]  Epilepsy Attending: Lora Havens  Referring Physician/Provider: Derek Jack, MD  Duration: 10/23/2021 1612 to 10/24/2021 1057   Patient history: 56yo m with ams. EEG to evaluate to seizure   Level of alertness: lethargic , asleep   AEDs during EEG study: None   Technical aspects: This EEG study was done with scalp electrodes positioned according to the 10-20 International system of electrode placement. Electrical activity was reviewed with band pass filter of 1-70Hz , sensitivity of 7 uV/mm, display speed of 18mm/sec with a 60Hz  notched filter applied as appropriate. EEG data were recorded continuously and digitally stored.  Video monitoring was available and reviewed as appropriate.   Description: The posterior dominant rhythm consists of 8-9 Hz activity of moderate voltage (25-35 uV) seen predominantly in posterior head regions, symmetric and reactive to eye opening and eye closing.  EEG showed continuous generalized  mixed frequencies with predominantly 5-7hz  theta slowing. Sleep was characterized by sleep spindles (12-14Hz ), maximal fronto-central region.  Hyperventilation and photic stimulation were not performed.      ABNORMALITY - Continuous slow, generalized   IMPRESSION: This study is suggestive of mild diffuse encephalopathy, nonspecific etiology. No seizures or epileptiform discharges were seen throughout the recording.   [name]

## 2021-10-24 NOTE — Progress Notes (Signed)
LTM EEG discontinued - no skin breakdown at unhook.   

## 2021-10-24 NOTE — Progress Notes (Signed)
PCCM:  I called and spoke with the patient's daughter.  She is a little frustrated regarding communication.  She would like to speak with Dr. Quinn Axe or someone from neurology.  I did give her a update regarding his current ICU care.  Garner Nash, DO [facility] 10/24/2021 12:28 PM

## 2021-10-24 NOTE — Progress Notes (Signed)
Attending Neurologist's note:   I personally saw this patient, gathering history, performing a full neurologic examination, reviewing relevant labs, personally reviewing relevant imaging, and formulated the assessment and plan, adding the note above for completeness and clarity to accurately reflect my findings and recommendations. On my examination patient will not open his eyes to command but will track me if I open them for him. He is moving BLE spontaneously for me but not BUE. He will sometimes appear to wiggle toes on his LLE to command but this was poorly reproducible and he also wiggles them when he is not asked to. LP showed v mild pleiocytosis but CCM d/w ID who was not concerned about meningitis based on those results.  EEG has persistently showed no epileptiform activity and will be discontinued today.  I had a very long talk with his daughter about his prognosis.  I explained that his prognostication is particularly difficult given the there is no imaging or other finding to suggest catastrophic neurologic event however we have also not been able to identify a treatable or reversible cause for his encephalopathy which remains significantly out of proportion to that expected from the damage from the ischemic strokes.  We agreed to continue our discussion re: goals of care tomorrow.   This patient is critically ill and at significant risk of neurological worsening, death and care requires constant monitoring of vital signs, hemodynamics,respiratory and cardiac monitoring, neurological assessment, discussion with family, other specialists and medical decision making of high complexity. I spent 45 minutes of neurocritical care time  in the care of  this patient. This was time spent independent of any time provided by nurse practitioner or PA.       Su Monks, MD [facility] [phone]   If 7pm- 7am, please page neurology on call as listed in [facility].  Neurology Progress  Note  Brief HPI: Patient with history of polysubstance abuse, hepatitis C and GER presented to outside hospital on 9/6 after bring found unresponsive on a wellness check.  He was intubated for airway protection.  He was transferred here on 9/13 for MRI and found to have small acute infarcts in bilateral hemispheres with the largest in the right frontal lobe as well as bilateral abnormal cerebellar signal possibly representing mild vasogenic edema.  He was also found to have a right upper extremity DVT and is anticoagulated with Lovenox.  Chest tube was inserted for pleural effusion.  Subjective: Patient has been hemodynamically stable overnight.  Awaiting EEG read, will discontinue today if no seizure activity.  LP from yesterday shows pleiocytosis but meningitis panel negative.  Exam: Vitals:   10/24/21 0630 10/24/21 0700  BP:    Pulse: (!) 109   Resp: (!) 24   Temp: 99.7 F (37.6 C) 99.3 F (37.4 C)  SpO2: 97%    Gen: In bed, intubated, no continuous sedation, NAD Resp: respirations synchronous with ventilator  Neuro (intubated without sedation): Does not follow commands but will flicker BLE to noxious stimuli. No movement of BUE to noxious stimuli. PERRL, intact oculocephalic reflex.  Cough intact. 2+ patellar reflexes, unable to elicit biceps or brachioradialis  Pertinent Labs: CBC    Component Value Date/Time   WBC 19.6 (H) 10/24/2021 0651   RBC 3.76 (L) 10/24/2021 0651   HGB 12.2 (L) 10/24/2021 0651   HCT 36.7 (L) 10/24/2021 0651   PLT 653 (H) 10/24/2021 0651   MCV 97.6 10/24/2021 0651   MCH 32.4 10/24/2021 0651   MCHC 33.2 10/24/2021 [id number]  RDW 14.1 10/24/2021 0651   LYMPHSABS 2.0 09/05/2018 0257   MONOABS 0.5 09/05/2018 0257   EOSABS 0.1 09/05/2018 0257   BASOSABS 0.1 09/05/2018 0257       Latest Ref Rng & Units 10/24/2021    6:51 AM 10/23/2021    6:19 AM 10/22/2021   12:49 AM  BMP  Glucose 70 - 99 mg/dL 117  130  120   BUN 6 - 20 mg/dL 37  34  30   Creatinine  0.61 - 1.24 mg/dL 0.87  0.87  1.10   Sodium 135 - 145 mmol/L 143  143  140   Potassium 3.5 - 5.1 mmol/L 4.2  4.0  4.1   Chloride 98 - 111 mmol/L 108  107  107   CO2 22 - 32 mmol/L 21  24  27    Calcium 8.9 - 10.3 mg/dL 8.8  8.8  8.5     Lipid Panel     Component Value Date/Time   CHOL 161 10/22/2021 2002   TRIG 238 (H) 10/22/2021 2002   HDL 16 (L) 10/22/2021 2002   CHOLHDL 10.1 10/22/2021 2002   VLDL 48 (H) 10/22/2021 2002   [place] 97 10/22/2021 2002   A1C 5.5 Imaging Reviewed:  MRI brain: Multifocal small acute infarcts in bilateral cerebral hemispheres with larger infarct in right frontal lobe.  Bilateral abnormal cerebellar signal possibly reflecting mild vasogenic edema  CTA head and neck: No emergent LVO or high grade stenosis of intracranial arteries  CTV head - WNL  EEG 9/15 No seizures or epileptiform discharges, study suggestive of severe diffuse encephalopathy  Echocardiogram 9/15:  EF 60-65%, normal atrial septum, no source of emboli seen  Assessment: 56 year old patient with history of polysubstance abuse, hepatitis C and GERD presented to outside hospital after being found unresponsive, was intubated and brought here for MRI.  He was found to have multiple acute infarcts in bilateral cerebral hemispheres and is still minimally responsive.  CTA shows no LVO or significant stenosis, and strokes do not explain patient's decreased level of responsiveness. LP performed yesterday by CCM to evaluate for infectious causes of altered mental status, meningitis panel was negative.  Very little information available about the incident that led to his hospitalization. Daughter is estranged. UDS positive for cocaine. High suspicion for anoxic brain injury. MRI Brain demonstrates mild BL vasogenic cerebellar edema(?cocaine leukoencephalopathy vs hypomagnesemia, less likely CHANTERs) along with BL embolic appearing infarct which do not seem to be in watershed distribution. These findings  are very atypical for anoxic injury which is our leading diagnosis.   Impression: Acute toxic / metabolic encephalopathy vs. encephalitis in patient with acute ischemic strokes, likely cardioembolic  Recommendations: 1)Discontinue LTM EEG if no seizure activity seen on read today 2)Avoid deliriogenic medications and sedation if possible 3)Care of comorbidities per CCM  4) CT venogram of head 5)Continue high dose thiamine  Cortney E Carron Curie , MSN, AGACNP-BC [facility] See Amion for schedule and pager information 10/24/2021 8:55 AM

## 2021-10-24 NOTE — Progress Notes (Deleted)
Removed 40 staples from patients head as ordered per MD. Site has attached edges, and is clean and dry. Minor scabbing.

## 2021-10-25 ENCOUNTER — Inpatient Hospital Stay (HOSPITAL_COMMUNITY): Payer: Medicaid Other

## 2021-10-25 DIAGNOSIS — J9811 Atelectasis: Secondary | ICD-10-CM | POA: Diagnosis not present

## 2021-10-25 DIAGNOSIS — R9389 Abnormal findings on diagnostic imaging of other specified body structures: Secondary | ICD-10-CM | POA: Diagnosis not present

## 2021-10-25 DIAGNOSIS — J90 Pleural effusion, not elsewhere classified: Secondary | ICD-10-CM | POA: Diagnosis not present

## 2021-10-25 DIAGNOSIS — G931 Anoxic brain damage, not elsewhere classified: Secondary | ICD-10-CM | POA: Diagnosis not present

## 2021-10-25 DIAGNOSIS — G9340 Encephalopathy, unspecified: Secondary | ICD-10-CM | POA: Diagnosis not present

## 2021-10-25 DIAGNOSIS — I639 Cerebral infarction, unspecified: Secondary | ICD-10-CM | POA: Diagnosis not present

## 2021-10-25 DIAGNOSIS — R9401 Abnormal electroencephalogram [EEG]: Secondary | ICD-10-CM | POA: Diagnosis not present

## 2021-10-25 LAB — CBC
HCT: 36.9 % — ABNORMAL LOW (ref 39.0–52.0)
Hemoglobin: 12.2 g/dL — ABNORMAL LOW (ref 13.0–17.0)
MCH: 32.3 pg (ref 26.0–34.0)
MCHC: 33.1 g/dL (ref 30.0–36.0)
MCV: 97.6 fL (ref 80.0–100.0)
Platelets: 729 10*3/uL — ABNORMAL HIGH (ref 150–400)
RBC: 3.78 MIL/uL — ABNORMAL LOW (ref 4.22–5.81)
RDW: 13.9 % (ref 11.5–15.5)
WBC: 18.3 10*3/uL — ABNORMAL HIGH (ref 4.0–10.5)
nRBC: 0 % (ref 0.0–0.2)

## 2021-10-25 LAB — GLUCOSE, CAPILLARY
Glucose-Capillary: 103 mg/dL — ABNORMAL HIGH (ref 70–99)
Glucose-Capillary: 107 mg/dL — ABNORMAL HIGH (ref 70–99)
Glucose-Capillary: 123 mg/dL — ABNORMAL HIGH (ref 70–99)
Glucose-Capillary: 123 mg/dL — ABNORMAL HIGH (ref 70–99)
Glucose-Capillary: 128 mg/dL — ABNORMAL HIGH (ref 70–99)
Glucose-Capillary: 130 mg/dL — ABNORMAL HIGH (ref 70–99)
Glucose-Capillary: 131 mg/dL — ABNORMAL HIGH (ref 70–99)

## 2021-10-25 LAB — CULTURE, RESPIRATORY W GRAM STAIN

## 2021-10-25 LAB — BASIC METABOLIC PANEL
Anion gap: 11 (ref 5–15)
BUN: 40 mg/dL — ABNORMAL HIGH (ref 6–20)
CO2: 22 mmol/L (ref 22–32)
Calcium: 8.9 mg/dL (ref 8.9–10.3)
Chloride: 109 mmol/L (ref 98–111)
Creatinine, Ser: 0.88 mg/dL (ref 0.61–1.24)
GFR, Estimated: >60 mL/min (ref >60)
Glucose, Bld: 114 mg/dL — ABNORMAL HIGH (ref 70–99)
Potassium: 4 mmol/L (ref 3.5–5.1)
Sodium: 142 mmol/L (ref 135–145)

## 2021-10-25 LAB — BODY FLUID CULTURE W GRAM STAIN
Culture: NO GROWTH
Gram Stain: NONE SEEN

## 2021-10-25 LAB — PHOSPHORUS: Phosphorus: 3.9 mg/dL (ref 2.5–4.6)

## 2021-10-25 LAB — MAGNESIUM: Magnesium: 2.6 mg/dL — ABNORMAL HIGH (ref 1.7–2.4)

## 2021-10-25 MED ORDER — LACTULOSE 10 GM/15ML PO SOLN
20.0000 g | Freq: Every day | ORAL | Status: DC
  Administered 2021-10-26 – 2021-10-27 (×2): 20 g
  Filled 2021-10-25 (×2): qty 30

## 2021-10-25 NOTE — Progress Notes (Addendum)
Attending Neurologist's note:   I personally saw this patient, gathering history, performing a full neurologic examination, reviewing relevant labs, personally reviewing relevant imaging, and formulated the assessment and plan, adding the note above for completeness and clarity to accurately reflect my findings and recommendations. On my examination patient will not open his eyes to command but will track me if I open them for him. He is moving BLE spontaneously for me but not BUE. He will sometimes appear to wiggle toes on his LLE to command but this was poorly reproducible and he also wiggles them when he is not asked to. He was able to open his eyes on his own for part of the exam today and when I asked him to squeeze both eyes shut he did so briskly. He has a significant component of apraxia, eyelid and otherwise, but ultimately he is simply not able to reliably follow any commands. He is unlikely to be able to protect his airway if extubated.   His prognostication is particularly difficult given the there is no imaging or other finding to suggest catastrophic neurologic event however we have also not been able to identify a treatable or reversible cause for his encephalopathy which remains significantly out of proportion to that expected from the damage from the ischemic strokes.  LP showed v mild pleiocytosis but CCM d/w ID who was not concerned about meningitis based on those results.  EEG persistently showed no epileptiform activity.   I will call his daughter again today per her request. There are no new developments from a neuro standpoint.    This patient is critically ill and at significant risk of neurological worsening, death and care requires constant monitoring of vital signs, hemodynamics,respiratory and cardiac monitoring, neurological assessment, discussion with family, other specialists and medical decision making of high complexity. I spent 60 minutes of neurocritical care time  in the  care of  this patient. This was time spent independent of any time provided by nurse practitioner or PA.       Su Monks, MD [facility] [phone]   If 7pm- 7am, please page neurology on call as listed in [place].  Neurology Progress Note  Brief HPI: Patient with history of polysubstance abuse, hepatitis C and GER presented to outside hospital on 9/6 after bring found unresponsive on a wellness check.  He was intubated for airway protection.  He was transferred here on 9/13 for MRI and found to have small acute infarcts in bilateral hemispheres with the largest in the right frontal lobe as well as bilateral abnormal cerebellar signal possibly representing mild vasogenic edema.  He was also found to have a right upper extremity DVT and is anticoagulated with Lovenox.  Chest tube was inserted for pleural effusion.  Subjective: Patient has been hemodynamically stable overnight. Neurological exam is unchanged.  He continues to have constant nonpurposeful leg movements.  Discussion regarding condition and prognosis was had with daughter yesterday.  Exam: Vitals:   10/25/21 0630 10/25/21 0756  BP:    Pulse: 98   Resp: 16   Temp:  98.5 F (36.9 C)  SpO2: 96% 97%   Gen: In bed, intubated, no continuous sedation, NAD Resp: respirations synchronous with ventilator  Neuro (intubated without sedation): Does not follow commands but will flicker BLE to noxious stimuli. Some nonpurposeful movement of LUE. No movement of RUE to noxious stimuli. PERRL, does not track.  Cough intact. 2+ patellar reflexes, unable to elicit biceps or brachioradialis  Pertinent Labs: CBC  Component Value Date/Time   WBC 18.3 (H) 10/25/2021 0339   RBC 3.78 (L) 10/25/2021 0339   HGB 12.2 (L) 10/25/2021 0339   HCT 36.9 (L) 10/25/2021 0339   PLT 729 (H) 10/25/2021 0339   MCV 97.6 10/25/2021 0339   MCH 32.3 10/25/2021 0339   MCHC 33.1 10/25/2021 0339   RDW 13.9 10/25/2021 0339   LYMPHSABS 2.0  09/05/2018 0257   MONOABS 0.5 09/05/2018 0257   EOSABS 0.1 09/05/2018 0257   BASOSABS 0.1 09/05/2018 0257       Latest Ref Rng & Units 10/25/2021    3:39 AM 10/24/2021    6:51 AM 10/23/2021    6:19 AM  BMP  Glucose 70 - 99 mg/dL 114  117  130   BUN 6 - 20 mg/dL 40  37  34   Creatinine 0.61 - 1.24 mg/dL 0.88  0.87  0.87   Sodium 135 - 145 mmol/L 142  143  143   Potassium 3.5 - 5.1 mmol/L 4.0  4.2  4.0   Chloride 98 - 111 mmol/L 109  108  107   CO2 22 - 32 mmol/L 22  21  24    Calcium 8.9 - 10.3 mg/dL 8.9  8.8  8.8     Lipid Panel     Component Value Date/Time   CHOL 161 10/22/2021 2002   TRIG 238 (H) 10/22/2021 2002   HDL 16 (L) 10/22/2021 2002   CHOLHDL 10.1 10/22/2021 2002   VLDL 48 (H) 10/22/2021 2002   Alvin 97 10/22/2021 2002   A1C 5.5 Imaging Reviewed:  MRI brain: Multifocal small acute infarcts in bilateral cerebral hemispheres with larger infarct in right frontal lobe.  Bilateral abnormal cerebellar signal possibly reflecting mild vasogenic edema  CTA head and neck: No emergent LVO or high grade stenosis of intracranial arteries  CTV head - WNL  EEG 9/15 No seizures or epileptiform discharges, study suggestive of severe diffuse encephalopathy  Echocardiogram 9/15:  EF 60-65%, normal atrial septum, no source of emboli seen  Assessment: 56 year old patient with history of polysubstance abuse, hepatitis C and GERD presented to outside hospital after being found unresponsive, was intubated and brought here for MRI.  He was found to have multiple acute infarcts in bilateral cerebral hemispheres and is still minimally responsive.  CTA shows no LVO or significant stenosis, and strokes do not explain patient's decreased level of responsiveness. LP performed yesterday by CCM to evaluate for infectious causes of altered mental status, meningitis panel was negative.  Very little information available about the incident that led to his hospitalization. Daughter is estranged. UDS  positive for cocaine. High suspicion for anoxic brain injury. MRI Brain demonstrates mild BL vasogenic cerebellar edema(?cocaine leukoencephalopathy vs hypomagnesemia, less likely CHANTERs) along with BL embolic appearing infarct which do not seem to be in watershed distribution. These findings are very atypical for anoxic injury which is our leading diagnosis.   Impression: Acute toxic / metabolic encephalopathy vs. encephalitis in patient with acute ischemic strokes, likely cardioembolic  Recommendations: 1)Avoid deliriogenic medications and sedation if possible 2)Care of comorbidities per CCM  3)Continue high dose thiamine 4) No indication for antiplatelets for secondary stroke prevention, patient is on therapeutic lovenox fot DVT 5) See attending attestation for further recommendations  [place] , MSN, AGACNP-BC [facility] See Amion for schedule and pager information 10/25/2021 8:43 AM

## 2021-10-25 NOTE — Progress Notes (Addendum)
NAME:  [name], MRN:  [id number], DOB:  April 06, 1965, LOS: 4 ADMISSION DATE:  10/21/2021, CONSULTATION DATE:  9/15 REFERRING MD:  Estelle Grumbles, CHIEF COMPLAINT:  anoxic injury   History of Present Illness:  56 year old male with past medical history significant for polysubstance abuse, hepatitis C, and GERD who presented to [facility] [place] in [place] on 9/6 after being found down.  He was last known well at approximately 1300 hrs. that day, however, EMS was called for wellness check at some time later on.  He was found to be down and unresponsive.  Upon EMS evaluation the patient was apneic with a thready pulse.  He had no response to intranasal Narcan.  He was bagged in route to the emergency department at which he arrived around 2300 hrs.  He was intubated immediately upon arrival to the ED.  CT of the head demonstrated cerebral edema without loss of white matter differentiation.  Repeat CT on 9/7 was read as normal by radiology, however, the neurologist still remained concerned for anoxic injury and recommended transfer to tertiary center for MRI, as the sending facility does not have ventilators or IV pumps compatible with MRI.  The patient was unable to transfer due to lack of available ICU beds.  The patient had been unable to wean from sedation due to significant agitation.  He would have nonpurposeful movement in the left upper and bilateral lower extremities.  No movement has been seen in his right upper extremity since presentation.  On 9/11 he was noted to have upper extremity edema on the right.  Ultrasound demonstrated acute thrombus in the mid and distal basilic vein.  Course also complicated by aspiration pneumonia for which he has completed a course of antibiotics.    On 9/13 he was transferred to [facility].   Pertinent  Medical History  GERD Head injury Hepatitis C Hiatal hernia Cocaine use (per daughter's report)  Significant Hospital Events: Including procedures,  antibiotic start and stop dates in addition to other pertinent events   9/6 found down > present to [facility] ED. CT with bilateral cerebellar edema. 9/7 repeat CT normal Unable to be weaned from sedation 9/11 RUE DVT started on tx dose lovenox 9/13 transfer to [facility] for MRI 9/14 chest tube placed with intrapleural fibrinolytic administration 9/16 chest tube removed  Interim History / Subjective:  Patient afebrile overnight, remains on ventilator. No acute events overnight.  Continues to move extremities.  Objective   Blood pressure 137/88, pulse 99, temperature 99.2 F (37.3 C), temperature source Oral, resp. rate 18, height 5\' 10"  (1.778 m), weight 76.9 kg, SpO2 97 %.    Vent Mode: PRVC FiO2 (%):  [40 %] 40 % Set Rate:  [18 bmp] 18 bmp Vt Set:  [550 mL] 550 mL PEEP:  [5 cmH20] 5 cmH20 Pressure Support:  [12 cmH20] 12 cmH20 Plateau Pressure:  [11 cmH20-19 cmH20] 11 cmH20   Intake/Output Summary (Last 24 hours) at 10/25/2021 8756 Last data filed at 10/25/2021 0400 Gross per 24 hour  Intake 1100.82 ml  Output 1520 ml  Net -419.18 ml   Filed Weights   10/22/21 0400 10/23/21 0400 10/24/21 0500  Weight: 80.9 kg 80.2 kg 76.9 kg    Examination: General: middle aged male, NAD. On ventilator HENT: [place]/AT. Eyes anicteric. ETT in place Lungs: Mechanically ventilated breath sounds Cardiovascular: RRR, no murmurs Abdomen: soft, nontender, nondistended Extremities: Warm, dry. No significant edema Neuro: Spontaneous movements of extremities, eyes. Questionably able to follow some commands? +Cough reflex.  Resolved Hospital Problem list     Assessment & Plan:  Acute toxic/metabolic encephalopathy in the setting of ischemic strokes MRI with evidence of multiple cerebral infarcts and cerebellar vasogenic edema. 24 hour EEG negative for epileptiform activity. CT venogram head normla.  - Appreciate neurology input - LP findings many RBCs, not consistent with meningitis overall - Continue  thiamine supplementation - Continue Zyprexa qhs  - Precedex if needed - Supportive care - Plan for GOC discussion with family today   Multifocal cerebral infarcts  Etiology likely vasospastic infarcts in the setting of cocaine use. Unclear significance of cerebellar vasogenic edema. LT EEG discontinued.  - Supportive care - Appreciate neurology input   Right sided aspiration pneumonia with parapneumonic effusion  S/p chest tube placement with intrapleural lytics on 9/14, chest tube removed 9/16. Repeat CXR this morning with residual right base atelectasis with tiny right pleural effusion. No evidence of R pneumothorax.  - Continue Zosyn   Acute hypoxemic respiratory failure secondary to aspiration pneumonia On PSV/CPAP this morning, PEEP 5, FiO2 40%.  - Mentation/mental status precludes extubation  - Ongoing conversations with daughter regarding potential need for prolonged mechanical vent need as well is tracheostomy  Superficial vein thrombosis Diagnosed 9/11 with R basilic vein DVT - Continue therapeutic Lovenox   Protein calorie malnutrition - Continue tube feeds   Mild transaminitis  Hepatitis C AST 65, ALT 94. Ammonia 48. - Lactulose daily   GERD - Continue PPI  Best Practice (right click and "Reselect all SmartList Selections" daily)   Diet/type: tubefeeds DVT prophylaxis: systemic dose LMWH GI prophylaxis: PPI Lines: N/A Foley:  N/A Code Status:  full code Last date of multidisciplinary goals of care discussion [9/16]  Labs   CBC: Recent Labs  Lab 10/21/21 2335 10/22/21 0049 10/23/21 0619 10/24/21 0651 10/25/21 0339  WBC  --  20.5* 17.4* 19.6* 18.3*  HGB 11.2* 8.8* 12.3* 12.2* 12.2*  HCT 33.0* 26.3* 37.3* 36.7* 36.9*  MCV  --  98.9 96.6 97.6 97.6  PLT  --  619* 583* 653* 729*    Basic Metabolic Panel: Recent Labs  Lab 10/21/21 2335 10/22/21 0049 10/23/21 0619 10/24/21 0651 10/25/21 0339  NA 141 140 143 143 142  K 3.9 4.1 4.0 4.2 4.0  CL   --  107 107 108 109  CO2  --  27 24 21* 22  GLUCOSE  --  120* 130* 117* 114*  BUN  --  30* 34* 37* 40*  CREATININE  --  1.10 0.87 0.87 0.88  CALCIUM  --  8.5* 8.8* 8.8* 8.9  MG  --  2.7* 2.5* 2.5* 2.6*  PHOS  --  4.1 3.5 4.0 3.9   GFR: Estimated Creatinine Clearance: 95.6 mL/min (by C-G formula based on SCr of 0.88 mg/dL). Recent Labs  Lab 10/22/21 0049 10/23/21 0619 10/24/21 0651 10/25/21 0339  PROCALCITON 0.58  --   --   --   WBC 20.5* 17.4* 19.6* 18.3*    Liver Function Tests: Recent Labs  Lab 10/22/21 0049 10/24/21 1807  AST 65* 46*  ALT 94* 86*  ALKPHOS 186* 166*  BILITOT 0.8 0.6  PROT 7.1 7.2  ALBUMIN 2.0* 2.2*   No results for input(s): "LIPASE", "AMYLASE" in the last 168 hours. Recent Labs  Lab 10/22/21 1151 10/24/21 1807  AMMONIA 48* 32    ABG    Component Value Date/Time   PHART 7.444 10/21/2021 2335   PCO2ART 38.3 10/21/2021 2335   PO2ART 85 10/21/2021 2335   HCO3  26.1 10/21/2021 2335   TCO2 27 10/21/2021 2335   O2SAT 96 10/21/2021 2335     Coagulation Profile: Recent Labs  Lab 10/22/21 0049  INR 1.0    Cardiac Enzymes: No results for input(s): "CKTOTAL", "CKMB", "CKMBINDEX", "TROPONINI" in the last 168 hours.  HbA1C: Hgb A1c MFr Bld  Date/Time Value Ref Range Status  10/22/2021 08:02 PM 5.5 4.8 - 5.6 % Final    Comment:    (NOTE) Pre diabetes:          5.7%-6.4%  Diabetes:              >6.4%  Glycemic control for   <7.0% adults with diabetes   02/14/2019 09:11 AM 5.5 4.8 - 5.6 % Final    Comment:             Prediabetes: 5.7 - 6.4          Diabetes: >6.4          Glycemic control for adults with diabetes: <7.0     CBG: Recent Labs  Lab 10/24/21 1112 10/24/21 1608 10/24/21 1924 10/24/21 2352 10/25/21 0335  GLUCAP 121* 107* 126* 130* 107*    Review of Systems:   Unable to obtain  Past Medical History:  He,  has a past medical history of Acid reflux, Head injury, Hepatitis C, and Hiatal hernia.   Surgical  History:   Past Surgical History:  Procedure Laterality Date   ACROMIO-CLAVICULAR JOINT REPAIR Right 06/28/2018   Procedure: ACROMIO-CLAVICULAR JOINT IRRIGATION AND DEBRIDEMENT;  Surgeon: Cammy Copa, MD;  Location: [facility] OR;  Service: Orthopedics;  Laterality: Right;   FACIAL FRACTURE SURGERY     IRRIGATION AND DEBRIDEMENT SHOULDER Right 06/28/2018   Procedure: IRRIGATION AND DEBRIDEMENT SHOULDER;  Surgeon: Cammy Copa, MD;  Location: [facility] OR;  Service: Orthopedics;  Laterality: Right;   KNEE ARTHROSCOPY Left 06/23/2018   Procedure: LEFT KNEE ARTHROSCOPY KNEE I&D.;  Surgeon: Cammy Copa, MD;  Location: [facility] OR;  Service: Orthopedics;  Laterality: Left;     Social History:   reports that he has been smoking cigarettes. He has a 20.00 pack-year smoking history. He has never used smokeless tobacco. He reports current alcohol use. He reports that he does not currently use drugs after having used the following drugs: Cocaine.   Family History:  His family history includes Diabetes in his mother; Hypertension in his father and mother.   Allergies No Known Allergies   Home Medications  Prior to Admission medications   Medication Sig Start Date End Date Taking? Authorizing Provider  acetaminophen (TYLENOL) 160 MG/5ML elixir Place 15 mg/kg into feeding tube every 6 (six) hours as needed for fever or pain.   Yes [provider]  Alcohol, USP (ETHYL ALCOHOL) 95 % SOLN Place 1 Swab into the nose 2 (two) times daily. Swab each nostril twice daily   Yes [provider]  chlordiazePOXIDE (LIBRIUM) 25 MG capsule Place 25 mg into feeding tube 3 (three) times daily.   Yes [provider]  chlorhexidine (PERIDEX) 0.12 % solution Use as directed 15 mLs in the mouth or throat 2 (two) times daily.   Yes [provider]  DEXMEDETOMIDINE HCL IV Inject 100 mLs into the vein See admin instructions. MAR from [facility] does not include  concentration: Dexmedetomidin every 6 hours and 54 minutes, IV drip   Yes [provider]  enoxaparin (LOVENOX) 80 MG/0.8ML injection Inject 80 mg into the skin daily.   Yes [provider]  folic acid (FOLVITE) 1 MG tablet Place 1 mg into feeding tube daily.   Yes [provider]  Multiple Vitamin (MULTIVITAMIN) tablet Place 1 tablet into feeding tube daily.   Yes [provider]  OLANZapine (ZYPREXA) 5 MG tablet Place 5 mg into feeding tube 2 (two) times daily.   Yes [provider]  pantoprazole (PROTONIX) 40 MG injection Inject 40 mg into the vein daily.   Yes [provider]  propofol (DIPRIVAN) 1000 MG/100ML EMUL injection Inject 1,000 mg into the vein See admin instructions. Propofol 1000 mg / 100 ml at 10.031 ml/hr over 9 hours 59 minutes   Yes [provider]  scopolamine (TRANSDERM-SCOP) 1 MG/3DAYS Place 1 patch onto the skin every 3 (three) days.   Yes [provider]  Sofosbuvir-Velpatasvir (EPCLUSA) 400-100 MG TABS Take 1 tablet by mouth daily. 10/11/18  Yes Cliffton Asters, MD  thiamine (VITAMIN B1) 100 MG tablet Place 100 mg into feeding tube daily.   Yes [provider]     Critical care time: 24 mins

## 2021-10-26 DIAGNOSIS — G931 Anoxic brain damage, not elsewhere classified: Secondary | ICD-10-CM | POA: Diagnosis not present

## 2021-10-26 DIAGNOSIS — G9340 Encephalopathy, unspecified: Secondary | ICD-10-CM | POA: Diagnosis not present

## 2021-10-26 DIAGNOSIS — I639 Cerebral infarction, unspecified: Secondary | ICD-10-CM | POA: Diagnosis not present

## 2021-10-26 DIAGNOSIS — R9389 Abnormal findings on diagnostic imaging of other specified body structures: Secondary | ICD-10-CM | POA: Diagnosis not present

## 2021-10-26 DIAGNOSIS — R9401 Abnormal electroencephalogram [EEG]: Secondary | ICD-10-CM | POA: Diagnosis not present

## 2021-10-26 LAB — CSF CULTURE W GRAM STAIN
Culture: NO GROWTH
Gram Stain: NONE SEEN

## 2021-10-26 LAB — CBC
HCT: 37.7 % — ABNORMAL LOW (ref 39.0–52.0)
Hemoglobin: 12.3 g/dL — ABNORMAL LOW (ref 13.0–17.0)
MCH: 31.9 pg (ref 26.0–34.0)
MCHC: 32.6 g/dL (ref 30.0–36.0)
MCV: 97.7 fL (ref 80.0–100.0)
Platelets: 736 10*3/uL — ABNORMAL HIGH (ref 150–400)
RBC: 3.86 MIL/uL — ABNORMAL LOW (ref 4.22–5.81)
RDW: 13.7 % (ref 11.5–15.5)
WBC: 16.5 10*3/uL — ABNORMAL HIGH (ref 4.0–10.5)
nRBC: 0 % (ref 0.0–0.2)

## 2021-10-26 LAB — BASIC METABOLIC PANEL
Anion gap: 11 (ref 5–15)
BUN: 39 mg/dL — ABNORMAL HIGH (ref 6–20)
CO2: 23 mmol/L (ref 22–32)
Calcium: 9.3 mg/dL (ref 8.9–10.3)
Chloride: 110 mmol/L (ref 98–111)
Creatinine, Ser: 0.81 mg/dL (ref 0.61–1.24)
GFR, Estimated: >60 mL/min (ref >60)
Glucose, Bld: 133 mg/dL — ABNORMAL HIGH (ref 70–99)
Potassium: 3.8 mmol/L (ref 3.5–5.1)
Sodium: 144 mmol/L (ref 135–145)

## 2021-10-26 LAB — EXPECTORATED SPUTUM ASSESSMENT W GRAM STAIN, RFLX TO RESP C

## 2021-10-26 LAB — GLUCOSE, CAPILLARY
Glucose-Capillary: 108 mg/dL — ABNORMAL HIGH (ref 70–99)
Glucose-Capillary: 126 mg/dL — ABNORMAL HIGH (ref 70–99)
Glucose-Capillary: 101 mg/dL — ABNORMAL HIGH (ref 70–99)
Glucose-Capillary: 107 mg/dL — ABNORMAL HIGH (ref 70–99)
Glucose-Capillary: 92 mg/dL (ref 70–99)
Glucose-Capillary: 95 mg/dL (ref 70–99)

## 2021-10-26 LAB — PHOSPHORUS: Phosphorus: 3 mg/dL (ref 2.5–4.6)

## 2021-10-26 LAB — PATHOLOGIST SMEAR REVIEW

## 2021-10-26 LAB — MAGNESIUM: Magnesium: 2.4 mg/dL (ref 1.7–2.4)

## 2021-10-26 MED ORDER — OLANZAPINE 2.5 MG PO TABS: 2.5000 mg | ORAL_TABLET | Freq: Every day | ORAL | Status: DC

## 2021-10-26 MED ORDER — ORAL CARE MOUTH RINSE: 15.0000 mL | OROMUCOSAL | Status: DC | PRN

## 2021-10-26 MED ORDER — OLANZAPINE 5 MG PO TABS
5.0000 mg | ORAL_TABLET | Freq: Every day | ORAL | Status: DC
  Filled 2021-10-26: qty 1

## 2021-10-26 MED ORDER — ORAL CARE MOUTH RINSE
15.0000 mL | OROMUCOSAL | Status: DC
  Administered 2021-10-26 – 2021-11-12 (×66): 15 mL via OROMUCOSAL

## 2021-10-26 MED ORDER — DEXMEDETOMIDINE HCL IN NACL 400 MCG/100ML IV SOLN
0.4000 ug/kg/h | INTRAVENOUS | Status: DC
  Administered 2021-10-26: 0.4 ug/kg/h via INTRAVENOUS
  Administered 2021-10-27: 0.5 ug/kg/h via INTRAVENOUS
  Filled 2021-10-26 (×2): qty 100

## 2021-10-26 NOTE — Procedures (Signed)
Extubation Procedure Note  Patient Details:   Name: [name] DOB: 05/02/1965 MRN: [id number]   Airway Documentation:    Vent end date: 10/26/21 Vent end time: 0946   Evaluation  O2 sats: stable throughout Complications: No apparent complications Patient did tolerate procedure well. Bilateral Breath Sounds: Rhonchi   Pt extubated to 4L [place] per MD order. Pt had positive cuff leak prior to extubation. No stridor noted post extubation.  Vilinda Blanks 10/26/2021, 9:47 AM

## 2021-10-26 NOTE — Progress Notes (Signed)
Evening rounds  Agitated after extubation trying to pull out rectal tube.  Initially was following commands but we started precedex so now less so.  Will try to be less aggressive with sedation during day.  Overall tolerating extubation trial well.  Cortrak tomorrow and can wean off precedex.  Would treat like TBI with aggressive activity as tolerated during day and encouraging day/night cycles.  Daughter was updated around noon.  Erskine Emery MD PCCM

## 2021-10-26 NOTE — Progress Notes (Signed)
NAME:  Clifford MassedJames Sunga, MRN:  [id number], DOB:  11/19/1965, LOS: 5 ADMISSION DATE:  10/21/2021, CONSULTATION DATE:  9/15 REFERRING MD:  Riki RuskAkolbire, CHIEF COMPLAINT:  anoxic injury   History of Present Illness:  56 year old male with past medical history significant for polysubstance abuse, hepatitis C, and GERD who presented to [facility] in [place] on 9/6 after being found down.  He was last known well at approximately 1300 hrs. that day, however, EMS was called for wellness check at some time later on.  He was found to be down and unresponsive.  Upon EMS evaluation the patient was apneic with a thready pulse.  He had no response to intranasal Narcan.  He was bagged in route to the emergency department at which he arrived around 2300 hrs.  He was intubated immediately upon arrival to the ED.  CT of the head demonstrated cerebral edema without loss of white matter differentiation.  Repeat CT on 9/7 was read as normal by radiology, however, the neurologist still remained concerned for anoxic injury and recommended transfer to tertiary center for MRI, as the sending facility does not have ventilators or IV pumps compatible with MRI.  The patient was unable to transfer due to lack of available ICU beds.  The patient had been unable to wean from sedation due to significant agitation.  He would have nonpurposeful movement in the left upper and bilateral lower extremities.  No movement has been seen in his right upper extremity since presentation.  On 9/11 he was noted to have upper extremity edema on the right.  Ultrasound demonstrated acute thrombus in the mid and distal basilic vein.  Course also complicated by aspiration pneumonia for which he has completed a course of antibiotics.    On 9/13 he was [facility].   Pertinent  Medical History  GERD Head injury Hepatitis C Hiatal hernia Cocaine use (per daughter's report)  Significant Hospital Events: Including procedures,  antibiotic start and stop dates in addition to other pertinent events   9/6 found down > [facility] ED. [facility] with bilateral cerebellar edema. 9/7 repeat CT normal Unable to be weaned from sedation 9/11 RUE DVT started on tx dose lovenox 9/13 [facility] 9/14 chest tube placed with intrapleural fibrinolytic administration 9/16 chest tube removed   Interim History / Subjective:  Patient opens eyes, however, only questionably tracks. Not reliably following commands  Objective   Blood pressure (!) 130/96, pulse 100, temperature 99.2 F (37.3 C), temperature source Oral, resp. rate 18, height 5\' 10"  (1.778 m), weight 72.7 kg, SpO2 97 %.    Vent Mode: PRVC FiO2 (%):  [40 %] 40 % Set Rate:  [18 bmp] 18 bmp Vt Set:  [550 mL] 550 mL PEEP:  [5 cmH20] 5 cmH20 Pressure Support:  [10 cmH20] 10 cmH20 Plateau Pressure:  [12 cmH20-16 cmH20] 13 cmH20   Intake/Output Summary (Last 24 hours) at 10/26/2021 0655 Last data filed at 10/26/2021 0341 Gross per 24 hour  Intake 890.67 ml  Output 1185 ml  Net -294.33 ml   Filed Weights   10/24/21 0500 10/25/21 0500 10/26/21 0500  Weight: 76.9 kg 73.4 kg 72.7 kg    Examination: General: middle aged male, NAD. On ventilator HENT: [place]/AT. Eyes anicteric. ETT in place Lungs: Mechanically ventilated breath sounds Cardiovascular: RRR, no murmurs Abdomen: Soft, nontender, nondistended. Extremities: Warm, dry. No edema Neuro: Spontaneous movements of extremities and eyes. Does not reliably track. Does not reliably follow commands  Resolved Hospital Problem list     Assessment & Plan:  Acute toxic/metabolic encephalopathy in the setting of ischemic strokes MRI with evidence of multiple cerebral infarcts and cerebellar vasogenic edema. - Appreciate neurology input - Continue high dose thiamine supplementation - Continue Zyprexa qhs  - Precedex if needed - Supportive care - Ongoing GOC discussion with family    Multifocal cerebral  infarcts  Etiology likely vasospastic infarcts in the setting of cocaine use. Unclear significance of cerebellar vasogenic edema. LT EEG discontinued.  - Supportive care - Appreciate neurology input   Right sided aspiration pneumonia with parapneumonic effusion  S/p chest tube placement with intrapleural lytics on 9/14, chest tube removed 9/16. - Continue Zosyn   Acute hypoxemic respiratory failure secondary to aspiration pneumonia Tolerated SBT for a few hours yesterday, however, patient remains high risk and likely will not be able to protect his airway.  - Mentation/mental status precludes extubation - Ongoing conversations with daughter regarding potential need for prolonged mechanical vent need as well is tracheostomy vs trial of extubation    Superficial vein thrombosis Diagnosed 1/47 with R basilic vein DVT - Continue therapeutic Lovenox   Protein calorie malnutrition - Continue tube feeds   Mild transaminitis  Hepatitis C AST 65, ALT 94. Ammonia 48. - Lactulose daily   GERD - Continue PPI  Best Practice (right click and "Reselect all SmartList Selections" daily)   Diet/type: tubefeeds DVT prophylaxis: systemic dose LMWH GI prophylaxis: PPI Lines: N/A Foley:  N/A Code Status:  full code Last date of multidisciplinary goals of care discussion [9/16; will call again today]  Labs   CBC: Recent Labs  Lab 10/21/21 2335 10/22/21 0049 10/23/21 0619 10/24/21 0651 10/25/21 0339  WBC  --  20.5* 17.4* 19.6* 18.3*  HGB 11.2* 8.8* 12.3* 12.2* 12.2*  HCT 33.0* 26.3* 37.3* 36.7* 36.9*  MCV  --  98.9 96.6 97.6 97.6  PLT  --  619* 583* 653* 729*    Basic Metabolic Panel: Recent Labs  Lab 10/21/21 2335 10/22/21 0049 10/23/21 0619 10/24/21 0651 10/25/21 0339  NA 141 140 143 143 142  K 3.9 4.1 4.0 4.2 4.0  CL  --  107 107 108 109  CO2  --  27 24 21* 22  GLUCOSE  --  120* 130* 117* 114*  BUN  --  30* 34* 37* 40*  CREATININE  --  1.10 0.87 0.87 0.88  CALCIUM  --   8.5* 8.8* 8.8* 8.9  MG  --  2.7* 2.5* 2.5* 2.6*  PHOS  --  4.1 3.5 4.0 3.9   GFR: Estimated Creatinine Clearance: 95.2 mL/min (by C-G formula based on SCr of 0.88 mg/dL). Recent Labs  Lab 10/22/21 0049 10/23/21 0619 10/24/21 0651 10/25/21 0339  PROCALCITON 0.58  --   --   --   WBC 20.5* 17.4* 19.6* 18.3*    Liver Function Tests: Recent Labs  Lab 10/22/21 0049 10/24/21 1807  AST 65* 46*  ALT 94* 86*  ALKPHOS 186* 166*  BILITOT 0.8 0.6  PROT 7.1 7.2  ALBUMIN 2.0* 2.2*   No results for input(s): "LIPASE", "AMYLASE" in the last 168 hours. Recent Labs  Lab 10/22/21 1151 10/24/21 1807  AMMONIA 48* 32    ABG    Component Value Date/Time   PHART 7.444 10/21/2021 2335   PCO2ART 38.3 10/21/2021 2335   PO2ART 85 10/21/2021 2335   HCO3 26.1 10/21/2021 2335   TCO2 27 10/21/2021 2335   O2SAT 96 10/21/2021 2335  Coagulation Profile: Recent Labs  Lab 10/22/21 0049  INR 1.0    Cardiac Enzymes: No results for input(s): "CKTOTAL", "CKMB", "CKMBINDEX", "TROPONINI" in the last 168 hours.  HbA1C: Hgb A1c MFr Bld  Date/Time Value Ref Range Status  10/22/2021 08:02 PM 5.5 4.8 - 5.6 % Final    Comment:    (NOTE) Pre diabetes:          5.7%-6.4%  Diabetes:              >6.4%  Glycemic control for   <7.0% adults with diabetes   02/14/2019 09:11 AM 5.5 4.8 - 5.6 % Final    Comment:             Prediabetes: 5.7 - 6.4          Diabetes: >6.4          Glycemic control for adults with diabetes: <7.0     CBG: Recent Labs  Lab 10/25/21 1131 10/25/21 1613 10/25/21 1946 10/25/21 2329 10/26/21 0349  GLUCAP 123* 123* 128* 131* 108*    Review of Systems:   Unable to obtain  Past Medical History:  He,  has a past medical history of Acid reflux, Head injury, Hepatitis C, and Hiatal hernia.   Surgical History:   Past Surgical History:  Procedure Laterality Date   ACROMIO-CLAVICULAR JOINT REPAIR Right 06/28/2018   Procedure: ACROMIO-CLAVICULAR JOINT  IRRIGATION AND DEBRIDEMENT;  Surgeon: Cammy Copa, MD;  Location: [facility] OR;  Service: Orthopedics;  Laterality: Right;   FACIAL FRACTURE SURGERY     IRRIGATION AND DEBRIDEMENT SHOULDER Right 06/28/2018   Procedure: IRRIGATION AND DEBRIDEMENT SHOULDER;  Surgeon: Cammy Copa, MD;  Location: [facility] OR;  Service: Orthopedics;  Laterality: Right;   KNEE ARTHROSCOPY Left 06/23/2018   Procedure: LEFT KNEE ARTHROSCOPY KNEE I&D.;  Surgeon: Cammy Copa, MD;  Location: [facility] OR;  Service: Orthopedics;  Laterality: Left;     Social History:   reports that he has been smoking cigarettes. He has a 20.00 pack-year smoking history. He has never used smokeless tobacco. He reports current alcohol use. He reports that he does not currently use drugs after having used the following drugs: Cocaine.   Family History:  His family history includes Diabetes in his mother; Hypertension in his father and mother.   Allergies No Known Allergies   Home Medications  Prior to Admission medications   Medication Sig Start Date End Date Taking? Authorizing Provider  acetaminophen (TYLENOL) 160 MG/5ML elixir Place 15 mg/kg into feeding tube every 6 (six) hours as needed for fever or pain.   Yes [provider]  Alcohol, USP (ETHYL ALCOHOL) 95 % SOLN Place 1 Swab into the nose 2 (two) times daily. Swab each nostril twice daily   Yes [provider]  chlordiazePOXIDE (LIBRIUM) 25 MG capsule Place 25 mg into feeding tube 3 (three) times daily.   Yes [provider]  chlorhexidine (PERIDEX) 0.12 % solution Use as directed 15 mLs in the mouth or throat 2 (two) times daily.   Yes [provider]  DEXMEDETOMIDINE HCL IV Inject 100 mLs into the vein See admin instructions. MAR from [facility] does not include concentration: Dexmedetomidin every 6 hours and 54 minutes, IV drip   Yes [provider]  enoxaparin (LOVENOX) 80 MG/0.8ML injection Inject 80 mg into the skin  daily.   Yes [provider]  folic acid (FOLVITE) 1 MG tablet Place 1 mg into feeding tube daily.   Yes  [provider]  Multiple Vitamin (MULTIVITAMIN) tablet Place 1 tablet into feeding tube daily.   Yes [provider]  OLANZapine (ZYPREXA) 5 MG tablet Place 5 mg into feeding tube 2 (two) times daily.   Yes [provider]  pantoprazole (PROTONIX) 40 MG injection Inject 40 mg into the vein daily.   Yes [provider]  propofol (DIPRIVAN) 1000 MG/100ML EMUL injection Inject 1,000 mg into the vein See admin instructions. Propofol 1000 mg / 100 ml at 10.031 ml/hr over 9 hours 59 minutes   Yes [provider]  scopolamine (TRANSDERM-SCOP) 1 MG/3DAYS Place 1 patch onto the skin every 3 (three) days.   Yes [provider]  Sofosbuvir-Velpatasvir (EPCLUSA) 400-100 MG TABS Take 1 tablet by mouth daily. 10/11/18  Yes Cliffton Asters, MD  thiamine (VITAMIN B1) 100 MG tablet Place 100 mg into feeding tube daily.   Yes [provider]     Critical care time: 26 mins

## 2021-10-26 NOTE — Progress Notes (Addendum)
Subjective: The patient continues to be awake but unresponsive to external stimuli except for increased agitation with noxious stimuli. Continues to not follow any commands.   Objective: Current vital signs: BP (!) 125/91   Pulse 90   Temp 97.8 F (36.6 C) (Axillary)   Resp 18   Ht 5\' 10"  (1.778 m)   Wt 72.7 kg   SpO2 100%   BMI 23.00 kg/m  Vital signs in last 24 hours: Temp:  [97.8 F (36.6 C)-99.2 F (37.3 C)] 97.8 F (36.6 C) (09/18 1117) Pulse Rate:  [85-105] 90 (09/18 1430) Resp:  [15-36] 18 (09/18 1430) BP: (99-148)/(80-97) 125/91 (09/18 1400) SpO2:  [90 %-100 %] 100 % (09/18 1430) FiO2 (%):  [40 %] 40 % (09/18 0800) Weight:  [72.7 kg] 72.7 kg (09/18 0500)  Intake/Output from previous day: 09/17 0701 - 09/18 0700 In: 890.7 [NG/GT:690.5; IV Piggyback:200.2] Out: 1185 [Urine:1150; Stool:35] Intake/Output this shift: Total I/O In: 555.8 [I.V.:17.7; NG/GT:433.3; IV Piggyback:104.8] Out: 630 [Urine:50; Emesis/NG output:80; Stool:500] Nutritional status:  Diet Order     None       HEENT: [place]/AT  Lungs: Respirations unlabored Ext: No edema  Neurologic Exam: Ment: Awake with eyes open and glancing eye movements but does not fixate on examiner's face or any other visual stimuli. Murmuring incomprehensibly in response to repeated questions. Does not follow any commands. Moving all 4 extremities spontaneously which increases with agitation following noxious stimuli.  CN: Pupils 5 mm >> 3 mm bilaterally and sluggish. Does not blink to threat. Roving EOM are conjugate without gaze preference or nystagmus. Face is symmetric.  Motor/Sensory: Moving all 4 extremities spontaneously which increases with agitation following noxious stimuli. No asymmetry noted. Does not follow commands for formal strength testing.  Reflexes: 2+ in upper and lower extremities Cerebellar/Gait: Unable to assess  Lab Results: Results for orders placed or performed during the hospital encounter of  10/21/21 (from the past 48 hour(s))  Glucose, capillary     Status: Abnormal   Collection Time: 10/24/21  4:08 PM  Result Value Ref Range   Glucose-Capillary 107 (H) 70 - 99 mg/dL    Comment: Glucose reference range applies only to samples taken after fasting for at least 8 hours.  Hepatic function panel     Status: Abnormal   Collection Time: 10/24/21  6:07 PM  Result Value Ref Range   Total Protein 7.2 6.5 - 8.1 g/dL   Albumin 2.2 (L) 3.5 - 5.0 g/dL   AST 46 (H) 15 - 41 U/L   ALT 86 (H) 0 - 44 U/L   Alkaline Phosphatase 166 (H) 38 - 126 U/L   Total Bilirubin 0.6 0.3 - 1.2 mg/dL   Bilirubin, Direct 0.2 0.0 - 0.2 mg/dL   Indirect Bilirubin 0.4 0.3 - 0.9 mg/dL    Comment: Performed at [facility] [address], [place], [place] [address]  Ammonia     Status: None   Collection Time: 10/24/21  6:07 PM  Result Value Ref Range   Ammonia 32 9 - 35 umol/L    Comment: Performed at [facility], [place] [address], [place], [place] [address]  Glucose, capillary     Status: Abnormal   Collection Time: 10/24/21  7:24 PM  Result Value Ref Range   Glucose-Capillary 126 (H) 70 - 99 mg/dL    Comment: Glucose reference range applies only to samples taken after fasting for at least 8 hours.  Glucose, capillary     Status: Abnormal  Collection Time: 10/24/21 11:52 PM  Result Value Ref Range   Glucose-Capillary 130 (H) 70 - 99 mg/dL    Comment: Glucose reference range applies only to samples taken after fasting for at least 8 hours.  Glucose, capillary     Status: Abnormal   Collection Time: 10/25/21  3:35 AM  Result Value Ref Range   Glucose-Capillary 107 (H) 70 - 99 mg/dL    Comment: Glucose reference range applies only to samples taken after fasting for at least 8 hours.  Basic metabolic panel     Status: Abnormal   Collection Time: 10/25/21  3:39 AM  Result Value Ref Range   Sodium 142 135 - 145 mmol/L   Potassium 4.0 3.5 - 5.1 mmol/L   Chloride 109 98 - 111 mmol/L   CO2 22  22 - 32 mmol/L   Glucose, Bld 114 (H) 70 - 99 mg/dL    Comment: Glucose reference range applies only to samples taken after fasting for at least 8 hours.   BUN 40 (H) 6 - 20 mg/dL   Creatinine, Ser 0.88 0.61 - 1.24 mg/dL   Calcium 8.9 8.9 - 10.3 mg/dL   GFR, Estimated >60 >60 mL/min    Comment: (NOTE) Calculated using the CKD-EPI Creatinine Equation (2021)    Anion gap 11 5 - 15    Comment: Performed at [facility] [address], [place], [place] [address]  CBC     Status: Abnormal   Collection Time: 10/25/21  3:39 AM  Result Value Ref Range   WBC 18.3 (H) 4.0 - 10.5 K/uL   RBC 3.78 (L) 4.22 - 5.81 MIL/uL   Hemoglobin 12.2 (L) 13.0 - 17.0 g/dL   HCT 36.9 (L) 39.0 - 52.0 %   MCV 97.6 80.0 - 100.0 fL   MCH 32.3 26.0 - 34.0 pg   MCHC 33.1 30.0 - 36.0 g/dL   RDW 13.9 11.5 - 15.5 %   Platelets 729 (H) 150 - 400 K/uL   nRBC 0.0 0.0 - 0.2 %    Comment: Performed at [facility], [place] [address], [place], [place] [address]  Magnesium     Status: Abnormal   Collection Time: 10/25/21  3:39 AM  Result Value Ref Range   Magnesium 2.6 (H) 1.7 - 2.4 mg/dL    Comment: Performed at [facility] [address], [place], [place] [address]  Phosphorus     Status: None   Collection Time: 10/25/21  3:39 AM  Result Value Ref Range   Phosphorus 3.9 2.5 - 4.6 mg/dL    Comment: Performed at [facility] [address] [place], [place] [address]  Glucose, capillary     Status: Abnormal   Collection Time: 10/25/21  7:59 AM  Result Value Ref Range   Glucose-Capillary 103 (H) 70 - 99 mg/dL    Comment: Glucose reference range applies only to samples taken after fasting for at least 8 hours.  Glucose, capillary     Status: Abnormal   Collection Time: 10/25/21 11:31 AM  Result Value Ref Range   Glucose-Capillary 123 (H) 70 - 99 mg/dL    Comment: Glucose reference range applies only to samples taken after fasting for at least 8 hours.  Glucose, capillary     Status: Abnormal    Collection Time: 10/25/21  4:13 PM  Result Value Ref Range   Glucose-Capillary 123 (H) 70 - 99 mg/dL    Comment: Glucose reference range applies only  to samples taken after fasting for at least 8 hours.  Glucose, capillary     Status: Abnormal   Collection Time: 10/25/21  7:46 PM  Result Value Ref Range   Glucose-Capillary 128 (H) 70 - 99 mg/dL    Comment: Glucose reference range applies only to samples taken after fasting for at least 8 hours.  Glucose, capillary     Status: Abnormal   Collection Time: 10/25/21 11:29 PM  Result Value Ref Range   Glucose-Capillary 131 (H) 70 - 99 mg/dL    Comment: Glucose reference range applies only to samples taken after fasting for at least 8 hours.  Glucose, capillary     Status: Abnormal   Collection Time: 10/26/21  3:49 AM  Result Value Ref Range   Glucose-Capillary 108 (H) 70 - 99 mg/dL    Comment: Glucose reference range applies only to samples taken after fasting for at least 8 hours.  Basic metabolic panel     Status: Abnormal   Collection Time: 10/26/21  6:44 AM  Result Value Ref Range   Sodium 144 135 - 145 mmol/L   Potassium 3.8 3.5 - 5.1 mmol/L   Chloride 110 98 - 111 mmol/L   CO2 23 22 - 32 mmol/L   Glucose, Bld 133 (H) 70 - 99 mg/dL    Comment: Glucose reference range applies only to samples taken after fasting for at least 8 hours.   BUN 39 (H) 6 - 20 mg/dL   Creatinine, Ser 0.81 0.61 - 1.24 mg/dL   Calcium 9.3 8.9 - 10.3 mg/dL   GFR, Estimated >60 >60 mL/min    Comment: (NOTE) Calculated using the CKD-EPI Creatinine Equation (2021)    Anion gap 11 5 - 15    Comment: Performed at [facility] [address], [place], [place] [address]  CBC     Status: Abnormal   Collection Time: 10/26/21  6:44 AM  Result Value Ref Range   WBC 16.5 (H) 4.0 - 10.5 K/uL   RBC 3.86 (L) 4.22 - 5.81 MIL/uL   Hemoglobin 12.3 (L) 13.0 - 17.0 g/dL   HCT 37.7 (L) 39.0 - 52.0 %   MCV 97.7 80.0 - 100.0 fL   MCH 31.9 26.0 - 34.0 pg   MCHC  32.6 30.0 - 36.0 g/dL   RDW 13.7 11.5 - 15.5 %   Platelets 736 (H) 150 - 400 K/uL   nRBC 0.0 0.0 - 0.2 %    Comment: Performed at [facility], [place] [address], [place], [place] [address]  Magnesium     Status: None   Collection Time: 10/26/21  6:44 AM  Result Value Ref Range   Magnesium 2.4 1.7 - 2.4 mg/dL    Comment: Performed at [facility] [address], [place], [place] [address]  Phosphorus     Status: None   Collection Time: 10/26/21  6:44 AM  Result Value Ref Range   Phosphorus 3.0 2.5 - 4.6 mg/dL    Comment: Performed at [facility] [address], [place], [place] [address]  Glucose, capillary     Status: Abnormal   Collection Time: 10/26/21  8:05 AM  Result Value Ref Range   Glucose-Capillary 126 (H) 70 - 99 mg/dL    Comment: Glucose reference range applies only to samples taken after fasting for at least 8 hours.  Glucose, capillary     Status: Abnormal   Collection Time: 10/26/21 11:15 AM  Result Value Ref  Range   Glucose-Capillary 101 (H) 70 - 99 mg/dL    Comment: Glucose reference range applies only to samples taken after fasting for at least 8 hours.    Recent Results (from the past 240 hour(s))  MRSA Next Gen by PCR, Nasal     Status: None   Collection Time: 10/21/21 11:01 PM   Specimen: Nasal Mucosa; Nasal Swab  Result Value Ref Range Status   MRSA by PCR Next Gen NOT DETECTED NOT DETECTED Final    Comment: (NOTE) The GeneXpert MRSA Assay (FDA approved for NASAL specimens only), is one component of a comprehensive MRSA colonization surveillance program. It is not intended to diagnose MRSA infection nor to guide or monitor treatment for MRSA infections. Test performance is not FDA approved in patients less than 33 years old. Performed at [facility], [name] [address] [place], [place] [address]   Culture, blood (Routine X 2) w Reflex to ID Panel     Status: None (Preliminary result)   Collection Time: 10/22/21 12:44 AM    Specimen: BLOOD LEFT HAND  Result Value Ref Range Status   Specimen Description BLOOD LEFT HAND  Final   Special Requests   Final    BOTTLES DRAWN AEROBIC ONLY Blood Culture results may not be optimal due to an inadequate volume of blood received in culture bottles   Culture   Final    NO GROWTH 4 DAYS Performed at [facility], [place] [address], [place], [place] [address]    Report Status PENDING  Incomplete  Culture, blood (Routine X 2) w Reflex to ID Panel     Status: None (Preliminary result)   Collection Time: 10/22/21 12:51 AM   Specimen: BLOOD  Result Value Ref Range Status   Specimen Description BLOOD LEFT ANTECUBITAL  Final   Special Requests   Final    BOTTLES DRAWN AEROBIC AND ANAEROBIC Blood Culture adequate volume   Culture   Final    NO GROWTH 4 DAYS Performed at [facility], [place] [address], [place], [place] [address]    Report Status PENDING  Incomplete  Expectorated Sputum Assessment w Gram Stain, Rflx to Resp Cult     Status: None (Preliminary result)   Collection Time: 10/22/21 10:06 AM   Specimen: Expectorated Sputum  Result Value Ref Range Status   Specimen Description EXPECTORATED SPUTUM  Final   Special Requests NONE  Final   Sputum evaluation   Final    THIS SPECIMEN IS ACCEPTABLE FOR SPUTUM CULTURE Performed at [facility], [place] [address], [place], [place] [address]    Report Status PENDING  Incomplete  C Difficile Quick Screen w PCR reflex     Status: None   Collection Time: 10/22/21 10:06 AM   Specimen: STOOL  Result Value Ref Range Status   C Diff antigen NEGATIVE NEGATIVE Final   C Diff toxin NEGATIVE NEGATIVE Final   C Diff interpretation No C. difficile detected.  Final    Comment: Performed at [facility], [place] [address] [place],  [address]  Culture, Respiratory w Gram Stain     Status: None   Collection Time: 10/22/21 10:06 AM  Result Value Ref Range Status   Specimen Description EXPECTORATED SPUTUM  Final    Special Requests NONE Reflexed from [id number]  Final   Gram Stain   Final    RARE WBC PRESENT,BOTH PMN AND MONONUCLEAR FEW GRAM NEGATIVE RODS    Culture  Final    ABUNDANT PSEUDOMONAS AERUGINOSA NO STAPHYLOCOCCUS AUREUS ISOLATED Performed at [facility], [place] [address] [place], [place] [address]    Report Status 10/25/2021 FINAL  Final   Organism ID, Bacteria PSEUDOMONAS AERUGINOSA  Final      Susceptibility   Pseudomonas aeruginosa - MIC*    CEFTAZIDIME 4 SENSITIVE Sensitive     CIPROFLOXACIN 0.5 SENSITIVE Sensitive     GENTAMICIN <=1 SENSITIVE Sensitive     IMIPENEM 2 SENSITIVE Sensitive     PIP/TAZO <=4 SENSITIVE Sensitive     CEFEPIME 2 SENSITIVE Sensitive     * ABUNDANT PSEUDOMONAS AERUGINOSA  Body fluid culture w Gram Stain     Status: None   Collection Time: 10/22/21 11:42 AM   Specimen: Pleural Fluid  Result Value Ref Range Status   Specimen Description FLUID  Final   Special Requests NONE  Final   Gram Stain NO WBC SEEN NO ORGANISMS SEEN   Final   Culture   Final    NO GROWTH 3 DAYS Performed at [facility], [place] [address] [place], [place] [address]    Report Status 10/25/2021 FINAL  Final  Fungus Culture With Stain     Status: None (Preliminary result)   Collection Time: 10/22/21 11:42 AM   Specimen: Pleural Fluid  Result Value Ref Range Status   Fungus Stain Final report  Final    Comment: (NOTE) Performed At: [facility] [address] [place], [place] [id number] Rush Farmer MD [id number]    Fungus (Mycology) Culture PENDING  Incomplete   Fungal Source FLUID  Final    Comment: Performed at [facility], [place] [address] [place], [place] [address]  Fungus Culture Result     Status: None   Collection Time: 10/22/21 11:42 AM  Result Value Ref Range Status   Result 1 Comment  Final    Comment: (NOTE) KOH/Calcofluor preparation:  no fungus observed. Performed At: [facility] [place], [place]  [id number] Rush Farmer MD [id number]   CSF culture w Gram Stain     Status: None   Collection Time: 10/23/21 10:31 AM   Specimen: CSF; Cerebrospinal Fluid  Result Value Ref Range Status   Specimen Description CSF  Final   Special Requests NONE  Final   Gram Stain   Final    NO ORGANISMS SEEN RARE WBC PRESENT,BOTH PMN AND MONONUCLEAR    Culture   Final    NO GROWTH 3 DAYS Performed at [facility], [place] [address] [place], [place] [address]    Report Status 10/26/2021 FINAL  Final  Culture, blood (Routine X 2)     Status: None (Preliminary result)   Collection Time: 10/23/21 11:42 AM   Specimen: BLOOD LEFT ARM  Result Value Ref Range Status   Specimen Description BLOOD LEFT ARM  Final   Special Requests   Final    BOTTLES DRAWN AEROBIC AND ANAEROBIC Blood Culture adequate volume   Culture   Final    NO GROWTH 3 DAYS Performed at [facility], [place] [address] [place], [place] [address]    Report Status PENDING  Incomplete  Culture, blood (Routine X 2)     Status: None (Preliminary result)   Collection Time: 10/23/21 11:47 AM   Specimen: BLOOD LEFT HAND  Result Value Ref Range Status   Specimen Description BLOOD LEFT HAND  Final   Special Requests   Final    BOTTLES  DRAWN AEROBIC AND ANAEROBIC Blood Culture adequate volume   Culture   Final    NO GROWTH 3 DAYS Performed at [facility], [place] [address], [place], [place] [address]    Report Status PENDING  Incomplete    Lipid Panel No results for input(s): "CHOL", "TRIG", "HDL", "CHOLHDL", "VLDL", "LDLCALC" in the last 72 hours.  Studies/Results: DG CHEST PORT 1 VIEW  Result Date: 10/25/2021 CLINICAL DATA:  Chest tube removal. EXAM: PORTABLE CHEST 1 VIEW COMPARISON:  10/24/2021 FINDINGS: Interval removal of the right pleural drain. No evidence for right-sided pneumothorax atelectasis noted right base with tiny right pleural effusion. Left lung remains clear The cardio pericardial silhouette is  enlarged. Telemetry leads overlie the chest. Endotracheal tube tip is approximately 4.8 cm above the base of the carina. The NG tube passes into the stomach although the distal tip position is not included on the film. IMPRESSION: 1. Interval removal of right chest tube without evidence for right-sided pneumothorax. 2. Residual right base atelectasis with tiny right pleural effusion. Electronically Signed   By: Misty Stanley M.D.   On: 10/25/2021 08:11    Medications: Scheduled:  Chlorhexidine Gluconate Cloth  6 each Topical Daily   enoxaparin (LOVENOX) injection  1 mg/kg Subcutaneous Q12H   lactulose  20 g Per Tube Daily   OLANZapine  5 mg Per Tube QHS   mouth rinse  15 mL Mouth Rinse 4 times per day   pantoprazole  40 mg Per Tube Daily   [START ON 10/31/2021] thiamine (VITAMIN B1) injection  100 mg Intravenous Daily   Continuous:  dexmedetomidine (PRECEDEX) IV infusion 0.4 mcg/kg/hr (10/26/21 1400)   feeding supplement (VITAL AF 1.2 CAL) 40 mL/hr at 10/26/21 1400   piperacillin-tazobactam (ZOSYN)  IV 12.5 mL/hr at 10/26/21 1400   thiamine (VITAMIN B1) injection Stopped (10/26/21 1127)    Assessment: 56 year old patient with a history of polysubstance abuse, hepatitis C and GERD who presented to outside hospital after being found unresponsive. He was intubated there and brought to [facility] for MRI, which revealed multiple acute infarcts in the bilateral cerebral hemispheres.  CTA shows no LVO or significant stenosis, and strokes do not explain the patient's decreased level of responsiveness. LP was performed by CCM to evaluate for infectious causes of altered mental status. Meningitis panel was negative. - Very little information available about the incident that led to his hospitalization. Daughter is estranged. - UDS positive for cocaine.  - LP showed mild pleiocytosis but CCM d/w ID who was not concerned about meningitis based on those results.   - EEGs: - EEG studies have persistently shown no  epileptiform activity.  - LTM EEG (10/23/2021 1612 to 10/24/2021 1057): Continuous slow, generalized. This study is suggestive of mild diffuse encephalopathy, nonspecific to etiology. No seizures or epileptiform discharges were seen throughout the recording. - High suspicion for anoxic brain injury versus toxin-induced encephalopathy. MRI Brain demonstrates mild BL vasogenic cerebellar edema (?cocaine leukoencephalopathy vs hypomagnesemia, less likely CHANTERs) along with BL embolic appearing infarcts which do not seem to be in a watershed distribution. Overall, the findings are atypical for anoxic brain injury.    Impression: Acute toxic / metabolic encephalopathy vs. encephalitis in patient with acute ischemic strokes, likely cardioembolic   Recommendations: 1) Avoid deliriogenic medications and sedation if possible 2) Care of comorbidities per CCM  3) Continue high dose thiamine 4)  No immediate indication for addition of antiplatelets for secondary stroke prevention, as the patient is on therapeutic lovenox  for DVT. When he is taken off anticoagulation, ASA should be started 5) Neurohospitalist service will continue to follow  6) Family not present at time of today's evaluation  35 minutes spent in the neurological evaluation and management of this critically ill patient.   Addendum: Attempted to contact daughter by telephone at 5:41 PM to discuss the patient, with no answer.    LOS: 5 days   @Electronically  signed: Dr. Kerney Elbe 10/26/2021  3:25 PM

## 2021-10-26 NOTE — Evaluation (Signed)
Clinical/Bedside Swallow Evaluation Patient Details  Name: [name] MRN: [id number] Date of Birth: [birth year]  Today's Date: 10/26/2021 Time: SLP Start Time (ACUTE ONLY): 1400 SLP Stop Time (ACUTE ONLY): 1420 SLP Time Calculation (min) (ACUTE ONLY): 20 min  Past Medical History:  Past Medical History:  Diagnosis Date   Acid reflux    Head injury    Hepatitis C    Hiatal hernia    Past Surgical History:  Past Surgical History:  Procedure Laterality Date   ACROMIO-CLAVICULAR JOINT REPAIR Right 06/28/2018   Procedure: ACROMIO-CLAVICULAR JOINT IRRIGATION AND DEBRIDEMENT;  Surgeon: [name], MD;  Location: [name];  Service: Orthopedics;  Laterality: Right;   FACIAL FRACTURE SURGERY     IRRIGATION AND DEBRIDEMENT SHOULDER Right 06/28/2018   Procedure: IRRIGATION AND DEBRIDEMENT SHOULDER;  Surgeon: [name], MD;  Location: [name];  Service: Orthopedics;  Laterality: Right;   KNEE ARTHROSCOPY Left 06/23/2018   Procedure: LEFT KNEE ARTHROSCOPY KNEE I&D.;  Surgeon: [name], MD;  Location: [name];  Service: Orthopedics;  Laterality: Left;   HPI:  56 year old male with past medical history significant for polysubstance abuse, hepatitis C, and GERD who presented to [name] in [place] on 9/6 after being found down. He was bagged in route to the emergency department at which he arrived around 2300 hrs.  He was intubated immediately upon arrival to the ED 9/6.  CT of the head demonstrated cerebral edema without loss of white matter differentiation.  MRI shows Multifocal, primarily small acute infarcts involving bilateral cerebral hemispheres.. Extubated 9/18.    Assessment / Plan / Recommendation  Clinical Impression  Pt demonstrates profound weakness and decreased awareness of PO. Pt needs tactile and verbal cues to achieve labial seal with ice or wet spoon. Pt did manage minimal oral manipulation, though with anterior spillage. Two swallows across  several minutes of intervention. Pt likely to need several days to two weeks to recover swallowing given prolonged intubation, deconditioning and neuromuscular impairment. Recommend Cortrak. SLP will f/u for trials.      Aspiration Risk       Diet Recommendation NPO;Alternative means - temporary        Other  Recommendations      Recommendations for follow up therapy are one component of a multi-disciplinary discharge planning process, led by the attending physician.  Recommendations may be updated based on patient status, additional functional criteria and insurance authorization.  Follow up Recommendations Acute inpatient rehab (3hours/day)      Assistance Recommended at Discharge Frequent or constant Supervision/Assistance  Functional Status Assessment Patient has had a recent decline in their functional status and demonstrates the ability to make significant improvements in function in a reasonable and predictable amount of time.  Frequency and Duration min 2x/week  2 weeks       Prognosis        Swallow Study   General HPI: 56 year old male with past medical history significant for polysubstance abuse, hepatitis C, and GERD who presented to [name] in [name] on 9/6 after being found down. He was bagged in route to the emergency department at which he arrived around 2300 hrs.  He was intubated immediately upon arrival to the ED 9/6.  CT of the head demonstrated cerebral edema without loss of white matter differentiation.  MRI shows Multifocal, primarily small acute infarcts involving bilateral cerebral hemispheres.. Extubated 9/18. Type of Study: Bedside Swallow Evaluation History of Recent Intubation: Yes Length of Intubations (days): 12 days  Date extubated: 10/26/21 Behavior/Cognition: Alert;Cooperative;Pleasant mood Oral Cavity Assessment: Within Functional Limits Oral Care Completed by SLP: No Oral Cavity - Dentition: Edentulous Vision: Impaired for  self-feeding Self-Feeding Abilities: Total assist Patient Positioning: Upright in bed Baseline Vocal Quality: Aphonic Volitional Cough: Cognitively unable to elicit    Oral/Motor/Sensory Function Overall Oral Motor/Sensory Function: Generalized oral weakness Facial ROM: Reduced right Facial Symmetry: Abnormal symmetry left   Ice Chips Ice chips: Impaired Oral Phase Functional Implications: Prolonged oral transit;Left anterior spillage   Thin Liquid Thin Liquid: Not tested    Nectar Thick Nectar Thick Liquid: Not tested   Honey Thick Honey Thick Liquid: Not tested   Puree Puree: Not tested   Solid     Solid: Not tested     Harlon Ditty, MA CCC-SLP  Acute Rehabilitation Services Secure Chat Preferred Office [phone]  Claudine Mouton 10/26/2021,2:37 PM

## 2021-10-27 ENCOUNTER — Inpatient Hospital Stay (HOSPITAL_COMMUNITY): Payer: Medicaid Other

## 2021-10-27 DIAGNOSIS — G9340 Encephalopathy, unspecified: Secondary | ICD-10-CM | POA: Diagnosis not present

## 2021-10-27 DIAGNOSIS — J9601 Acute respiratory failure with hypoxia: Secondary | ICD-10-CM

## 2021-10-27 DIAGNOSIS — I639 Cerebral infarction, unspecified: Secondary | ICD-10-CM | POA: Diagnosis not present

## 2021-10-27 DIAGNOSIS — Z4682 Encounter for fitting and adjustment of non-vascular catheter: Secondary | ICD-10-CM | POA: Diagnosis not present

## 2021-10-27 DIAGNOSIS — J90 Pleural effusion, not elsewhere classified: Secondary | ICD-10-CM | POA: Diagnosis not present

## 2021-10-27 LAB — CBC
HCT: 41.1 % (ref 39.0–52.0)
Hemoglobin: 13 g/dL (ref 13.0–17.0)
MCH: 32.1 pg (ref 26.0–34.0)
MCHC: 31.6 g/dL (ref 30.0–36.0)
MCV: 101.5 fL — ABNORMAL HIGH (ref 80.0–100.0)
Platelets: 725 10*3/uL — ABNORMAL HIGH (ref 150–400)
RBC: 4.05 MIL/uL — ABNORMAL LOW (ref 4.22–5.81)
RDW: 13.3 % (ref 11.5–15.5)
WBC: 13.6 10*3/uL — ABNORMAL HIGH (ref 4.0–10.5)
nRBC: 0 % (ref 0.0–0.2)

## 2021-10-27 LAB — PHOSPHORUS: Phosphorus: 3.9 mg/dL (ref 2.5–4.6)

## 2021-10-27 LAB — MAGNESIUM: Magnesium: 2.5 mg/dL — ABNORMAL HIGH (ref 1.7–2.4)

## 2021-10-27 LAB — BASIC METABOLIC PANEL
Anion gap: 9 (ref 5–15)
BUN: 39 mg/dL — ABNORMAL HIGH (ref 6–20)
CO2: 25 mmol/L (ref 22–32)
Calcium: 9.4 mg/dL (ref 8.9–10.3)
Chloride: 109 mmol/L (ref 98–111)
Creatinine, Ser: 0.87 mg/dL (ref 0.61–1.24)
GFR, Estimated: >60 mL/min (ref >60)
Glucose, Bld: 89 mg/dL (ref 70–99)
Potassium: 4.2 mmol/L (ref 3.5–5.1)
Sodium: 143 mmol/L (ref 135–145)

## 2021-10-27 LAB — GLUCOSE, CAPILLARY
Glucose-Capillary: 103 mg/dL — ABNORMAL HIGH (ref 70–99)
Glucose-Capillary: 119 mg/dL — ABNORMAL HIGH (ref 70–99)
Glucose-Capillary: 135 mg/dL — ABNORMAL HIGH (ref 70–99)
Glucose-Capillary: 153 mg/dL — ABNORMAL HIGH (ref 70–99)
Glucose-Capillary: 87 mg/dL (ref 70–99)
Glucose-Capillary: 88 mg/dL (ref 70–99)

## 2021-10-27 LAB — CULTURE, BLOOD (ROUTINE X 2)
Culture: NO GROWTH
Culture: NO GROWTH
Special Requests: ADEQUATE

## 2021-10-27 LAB — CYTOLOGY - NON PAP

## 2021-10-27 LAB — HSV 1/2 PCR, CSF
HSV-1 DNA: NEGATIVE
HSV-2 DNA: NEGATIVE

## 2021-10-27 MED ORDER — PROSOURCE TF20 ENFIT COMPATIBL EN LIQD
60.0000 mL | Freq: Every day | ENTERAL | Status: DC
  Administered 2021-10-27 – 2021-11-11 (×16): 60 mL
  Filled 2021-10-27 (×15): qty 60

## 2021-10-27 MED ORDER — DEXTROSE-NACL 5-0.9 % IV SOLN: INTRAVENOUS | Status: DC

## 2021-10-27 MED ORDER — MELATONIN 5 MG PO TABS
5.0000 mg | ORAL_TABLET | Freq: Every day | ORAL | Status: DC
  Administered 2021-10-27 – 2021-11-10 (×15): 5 mg
  Filled 2021-10-27 (×15): qty 1

## 2021-10-27 MED ORDER — FREE WATER
200.0000 mL | Status: DC
  Administered 2021-10-27 – 2021-10-30 (×16): 200 mL

## 2021-10-27 MED ORDER — OSMOLITE 1.5 CAL PO LIQD
1000.0000 mL | ORAL | Status: DC
  Administered 2021-10-27 – 2021-10-28 (×2): 1000 mL
  Filled 2021-10-27 (×3): qty 1000

## 2021-10-27 MED ORDER — OXYCODONE HCL 5 MG PO TABS
10.0000 mg | ORAL_TABLET | ORAL | Status: DC | PRN
  Administered 2021-10-28 – 2021-11-11 (×16): 10 mg
  Filled 2021-10-27 (×17): qty 2

## 2021-10-27 MED ORDER — ENOXAPARIN SODIUM 80 MG/0.8ML IJ SOSY
70.0000 mg | PREFILLED_SYRINGE | Freq: Two times a day (BID) | INTRAMUSCULAR | Status: DC
  Administered 2021-10-27 – 2021-11-15 (×37): 70 mg via SUBCUTANEOUS
  Filled 2021-10-27: qty 0.8
  Filled 2021-10-27: qty 0.7
  Filled 2021-10-27 (×3): qty 0.8
  Filled 2021-10-27: qty 0.7
  Filled 2021-10-27 (×6): qty 0.8
  Filled 2021-10-27: qty 0.7
  Filled 2021-10-27 (×17): qty 0.8
  Filled 2021-10-27: qty 0.7
  Filled 2021-10-27 (×8): qty 0.8

## 2021-10-27 MED ORDER — OLANZAPINE 2.5 MG PO TABS: 2.5000 mg | ORAL_TABLET | Freq: Every evening | ORAL | Status: DC | PRN

## 2021-10-27 NOTE — Progress Notes (Signed)
Subjective: Lying in bed awake and nonverbal with occasional spontaneous limb movements.   Objective: Current vital signs: BP 128/87 (BP Location: Left Arm)   Pulse 87   Temp 97.8 F (36.6 C) (Axillary)   Resp 19   Ht 5\' 10"  (1.778 m)   Wt 72.5 kg   SpO2 100%   BMI 22.93 kg/m  Vital signs in last 24 hours: Temp:  [97.7 F (36.5 C)-98.3 F (36.8 C)] 97.8 F (36.6 C) (09/19 0731) Pulse Rate:  [65-105] 87 (09/19 0800) Resp:  [13-36] 19 (09/19 0800) BP: (87-148)/(64-96) 128/87 (09/19 0800) SpO2:  [90 %-100 %] 100 % (09/19 0800) Weight:  [72.5 kg] 72.5 kg (09/19 0500)  Intake/Output from previous day: 09/18 0701 - 09/19 0700 In: 891.2 [I.V.:115; NG/GT:593.3; IV Piggyback:182.9] Out: 1027 [Urine:1050; Emesis/NG output:80; Stool:535] Intake/Output this shift: Total I/O In: 178.7 [I.V.:150.1; IV Piggyback:28.6] Out: -  Nutritional status:  Diet Order     None      HEENT: [place]/AT  Lungs: Respirations unlabored Ext: No edema   Neurologic Exam: Ment: Awake with eyes open and glancing eye movements but does not fixate on examiner's face or any other visual stimuli. Did softly utter "ow" to noxious stimuli. Otherwise with no verbal output. Does not follow any commands. Moving all 4 extremities spontaneously which increases with agitation following noxious stimuli.  CN: PERRL. Roving EOM are conjugate without gaze preference or nystagmus. Face is symmetric.  Motor/Sensory: Moving all 4 extremities spontaneously which increases with agitation following noxious stimuli. No asymmetry noted. Does not follow commands for formal strength testing. Tone is decreased in upper extremities.  Reflexes: 2+ in upper and lower extremities Cerebellar/Gait: Unable to assess  Lab Results: Results for orders placed or performed during the hospital encounter of 10/21/21 (from the past 48 hour(s))  Glucose, capillary     Status: Abnormal   Collection Time: 10/25/21 11:31 AM  Result Value Ref Range    Glucose-Capillary 123 (H) 70 - 99 mg/dL    Comment: Glucose reference range applies only to samples taken after fasting for at least 8 hours.  Glucose, capillary     Status: Abnormal   Collection Time: 10/25/21  4:13 PM  Result Value Ref Range   Glucose-Capillary 123 (H) 70 - 99 mg/dL    Comment: Glucose reference range applies only to samples taken after fasting for at least 8 hours.  Glucose, capillary     Status: Abnormal   Collection Time: 10/25/21  7:46 PM  Result Value Ref Range   Glucose-Capillary 128 (H) 70 - 99 mg/dL    Comment: Glucose reference range applies only to samples taken after fasting for at least 8 hours.  Glucose, capillary     Status: Abnormal   Collection Time: 10/25/21 11:29 PM  Result Value Ref Range   Glucose-Capillary 131 (H) 70 - 99 mg/dL    Comment: Glucose reference range applies only to samples taken after fasting for at least 8 hours.  Glucose, capillary     Status: Abnormal   Collection Time: 10/26/21  3:49 AM  Result Value Ref Range   Glucose-Capillary 108 (H) 70 - 99 mg/dL    Comment: Glucose reference range applies only to samples taken after fasting for at least 8 hours.  Basic metabolic panel     Status: Abnormal   Collection Time: 10/26/21  6:44 AM  Result Value Ref Range   Sodium 144 135 - 145 mmol/L   Potassium 3.8 3.5 - 5.1 mmol/L   Chloride  110 98 - 111 mmol/L   CO2 23 22 - 32 mmol/L   Glucose, Bld 133 (H) 70 - 99 mg/dL    Comment: Glucose reference range applies only to samples taken after fasting for at least 8 hours.   BUN 39 (H) 6 - 20 mg/dL   Creatinine, Ser 0.98 0.61 - 1.24 mg/dL   Calcium 9.3 8.9 - 11.9 mg/dL   GFR, Estimated >14 >78 mL/min    Comment: (NOTE) Calculated using the CKD-EPI Creatinine Equation (2021)    Anion gap 11 5 - 15    Comment: Performed at [facility], [address], [place], [place] [address]  CBC     Status: Abnormal   Collection Time: 10/26/21  6:44 AM  Result Value Ref Range   WBC 16.5  (H) 4.0 - 10.5 K/uL   RBC 3.86 (L) 4.22 - 5.81 MIL/uL   Hemoglobin 12.3 (L) 13.0 - 17.0 g/dL   HCT 13.0 (L) 86.5 - 78.4 %   MCV 97.7 80.0 - 100.0 fL   MCH 31.9 26.0 - 34.0 pg   MCHC 32.6 30.0 - 36.0 g/dL   RDW 69.6 29.5 - 28.4 %   Platelets 736 (H) 150 - 400 K/uL   nRBC 0.0 0.0 - 0.2 %    Comment: Performed at [facility], [address], [place], [place] [address]  Magnesium     Status: None   Collection Time: 10/26/21  6:44 AM  Result Value Ref Range   Magnesium 2.4 1.7 - 2.4 mg/dL    Comment: Performed at [facility], [address], [place], [place] [address]  Phosphorus     Status: None   Collection Time: 10/26/21  6:44 AM  Result Value Ref Range   Phosphorus 3.0 2.5 - 4.6 mg/dL    Comment: Performed at [facility], [address], [place], [place] [address]  Glucose, capillary     Status: Abnormal   Collection Time: 10/26/21  8:05 AM  Result Value Ref Range   Glucose-Capillary 126 (H) 70 - 99 mg/dL    Comment: Glucose reference range applies only to samples taken after fasting for at least 8 hours.  Glucose, capillary     Status: Abnormal   Collection Time: 10/26/21 11:15 AM  Result Value Ref Range   Glucose-Capillary 101 (H) 70 - 99 mg/dL    Comment: Glucose reference range applies only to samples taken after fasting for at least 8 hours.  Glucose, capillary     Status: Abnormal   Collection Time: 10/26/21  3:32 PM  Result Value Ref Range   Glucose-Capillary 107 (H) 70 - 99 mg/dL    Comment: Glucose reference range applies only to samples taken after fasting for at least 8 hours.  Glucose, capillary     Status: None   Collection Time: 10/26/21  7:29 PM  Result Value Ref Range   Glucose-Capillary 92 70 - 99 mg/dL    Comment: Glucose reference range applies only to samples taken after fasting for at least 8 hours.  Glucose, capillary     Status: None   Collection Time: 10/26/21 11:23 PM  Result Value Ref Range   Glucose-Capillary 95 70 - 99 mg/dL     Comment: Glucose reference range applies only to samples taken after fasting for at least 8 hours.  Glucose, capillary     Status: None   Collection Time: 10/27/21  3:13 AM  Result Value Ref Range  Glucose-Capillary 87 70 - 99 mg/dL    Comment: Glucose reference range applies only to samples taken after fasting for at least 8 hours.  Basic metabolic panel     Status: Abnormal   Collection Time: 10/27/21  6:56 AM  Result Value Ref Range   Sodium 143 135 - 145 mmol/L   Potassium 4.2 3.5 - 5.1 mmol/L   Chloride 109 98 - 111 mmol/L   CO2 25 22 - 32 mmol/L   Glucose, Bld 89 70 - 99 mg/dL    Comment: Glucose reference range applies only to samples taken after fasting for at least 8 hours.   BUN 39 (H) 6 - 20 mg/dL   Creatinine, Ser 4.33 0.61 - 1.24 mg/dL   Calcium 9.4 8.9 - 29.5 mg/dL   GFR, Estimated >18 >84 mL/min    Comment: (NOTE) Calculated using the CKD-EPI Creatinine Equation (2021)    Anion gap 9 5 - 15    Comment: Performed at [facility], [address], [place], [place] [address]  CBC     Status: Abnormal   Collection Time: 10/27/21  6:56 AM  Result Value Ref Range   WBC 13.6 (H) 4.0 - 10.5 K/uL   RBC 4.05 (L) 4.22 - 5.81 MIL/uL   Hemoglobin 13.0 13.0 - 17.0 g/dL   HCT 30.1 60.1 - 09.3 %   MCV 101.5 (H) 80.0 - 100.0 fL   MCH 32.1 26.0 - 34.0 pg   MCHC 31.6 30.0 - 36.0 g/dL   RDW 23.5 57.3 - 22.0 %   Platelets 725 (H) 150 - 400 K/uL   nRBC 0.0 0.0 - 0.2 %    Comment: Performed at [facility], [address], [place], [place] [address]  Magnesium     Status: Abnormal   Collection Time: 10/27/21  6:56 AM  Result Value Ref Range   Magnesium 2.5 (H) 1.7 - 2.4 mg/dL    Comment: Performed at [facility], [address], [place], [place] [address]  Phosphorus     Status: None   Collection Time: 10/27/21  6:56 AM  Result Value Ref Range   Phosphorus 3.9 2.5 - 4.6 mg/dL    Comment: Performed at [facility], [address] [place],  [place] [address]  Glucose, capillary     Status: None   Collection Time: 10/27/21  7:29 AM  Result Value Ref Range   Glucose-Capillary 88 70 - 99 mg/dL    Comment: Glucose reference range applies only to samples taken after fasting for at least 8 hours.    Recent Results (from the past 240 hour(s))  MRSA Next Gen by PCR, Nasal     Status: None   Collection Time: 10/21/21 11:01 PM   Specimen: Nasal Mucosa; Nasal Swab  Result Value Ref Range Status   MRSA by PCR Next Gen NOT DETECTED NOT DETECTED Final    Comment: (NOTE) The GeneXpert MRSA Assay (FDA approved for NASAL specimens only), is one component of a comprehensive MRSA colonization surveillance program. It is not intended to diagnose MRSA infection nor to guide or monitor treatment for MRSA infections. Test performance is not FDA approved in patients less than 47 years old. Performed at [facility], [address] [place], [place] [address]   Culture, blood (Routine X 2) w Reflex to ID Panel     Status: None   Collection Time: 10/22/21 12:44 AM   Specimen: BLOOD LEFT HAND  Result  Value Ref Range Status   Specimen Description BLOOD LEFT HAND  Final   Special Requests   Final    BOTTLES DRAWN AEROBIC ONLY Blood Culture results may not be optimal due to an inadequate volume of blood received in culture bottles   Culture   Final    NO GROWTH 5 DAYS Performed at [facility], [address] [place], [place] [address]    Report Status 10/27/2021 FINAL  Final  Culture, blood (Routine X 2) w Reflex to ID Panel     Status: None   Collection Time: 10/22/21 12:51 AM   Specimen: BLOOD  Result Value Ref Range Status   Specimen Description BLOOD LEFT ANTECUBITAL  Final   Special Requests   Final    BOTTLES DRAWN AEROBIC AND ANAEROBIC Blood Culture adequate volume   Culture   Final    NO GROWTH 5 DAYS Performed at [facility], [address] [place], [place] [address]    Report Status 10/27/2021 FINAL  Final   Expectorated Sputum Assessment w Gram Stain, Rflx to Resp Cult     Status: None   Collection Time: 10/22/21 10:06 AM   Specimen: Expectorated Sputum  Result Value Ref Range Status   Specimen Description EXPECTORATED SPUTUM  Final   Special Requests NONE  Final   Sputum evaluation   Final    THIS SPECIMEN IS ACCEPTABLE FOR SPUTUM CULTURE Performed at [facility], [address] [place], [place] [address]    Report Status 10/26/2021 FINAL  Final  C Difficile Quick Screen w PCR reflex     Status: None   Collection Time: 10/22/21 10:06 AM   Specimen: STOOL  Result Value Ref Range Status   C Diff antigen NEGATIVE NEGATIVE Final   C Diff toxin NEGATIVE NEGATIVE Final   C Diff interpretation No C. difficile detected.  Final    Comment: Performed at [facility], [address] [place], [place] [address]  Culture, Respiratory w Gram Stain     Status: None   Collection Time: 10/22/21 10:06 AM  Result Value Ref Range Status   Specimen Description EXPECTORATED SPUTUM  Final   Special Requests NONE Reflexed from [id number]  Final   Gram Stain   Final    RARE WBC PRESENT,BOTH PMN AND MONONUCLEAR FEW GRAM NEGATIVE RODS    Culture   Final    ABUNDANT PSEUDOMONAS AERUGINOSA NO STAPHYLOCOCCUS AUREUS ISOLATED Performed at [facility], [address] [place], [place] [address]    Report Status 10/25/2021 FINAL  Final   Organism ID, Bacteria PSEUDOMONAS AERUGINOSA  Final      Susceptibility   Pseudomonas aeruginosa - MIC*    CEFTAZIDIME 4 SENSITIVE Sensitive     CIPROFLOXACIN 0.5 SENSITIVE Sensitive     GENTAMICIN <=1 SENSITIVE Sensitive     IMIPENEM 2 SENSITIVE Sensitive     PIP/TAZO <=4 SENSITIVE Sensitive     CEFEPIME 2 SENSITIVE Sensitive     * ABUNDANT PSEUDOMONAS AERUGINOSA  Body fluid culture w Gram Stain     Status: None   Collection Time: 10/22/21 11:42 AM   Specimen: Pleural Fluid  Result Value Ref Range Status   Specimen Description FLUID  Final   Special  Requests NONE  Final   Gram Stain NO WBC SEEN NO ORGANISMS SEEN   Final   Culture   Final    NO GROWTH 3 DAYS Performed at [facility], [address], [place], [place] [address]    Report Status 10/25/2021 FINAL  Final  Fungus Culture With Stain     Status: None (Preliminary result)   Collection Time: 10/22/21 11:42 AM   Specimen: Pleural Fluid  Result Value Ref Range Status   Fungus Stain Final report  Final    Comment: (NOTE) Performed At: [facility] [address] [place], [place] [id number] Jolene Schimke MD [id number]    Fungus (Mycology) Culture PENDING  Incomplete   Fungal Source FLUID  Final    Comment: Performed at [facility], [address], [place], [place] [address]  Fungus Culture Result     Status: None   Collection Time: 10/22/21 11:42 AM  Result Value Ref Range Status   Result 1 Comment  Final    Comment: (NOTE) KOH/Calcofluor preparation:  no fungus observed. Performed At: [facility] [address] [place], [place] [id number] Jolene Schimke MD [id number]   CSF culture w Gram Stain     Status: None   Collection Time: 10/23/21 10:31 AM   Specimen: CSF; Cerebrospinal Fluid  Result Value Ref Range Status   Specimen Description CSF  Final   Special Requests NONE  Final   Gram Stain   Final    NO ORGANISMS SEEN RARE WBC PRESENT,BOTH PMN AND MONONUCLEAR    Culture   Final    NO GROWTH 3 DAYS Performed at [facility], [address] [place], [place] [address]    Report Status 10/26/2021 FINAL  Final  Culture, blood (Routine X 2)     Status: None (Preliminary result)   Collection Time: 10/23/21 11:42 AM   Specimen: BLOOD LEFT ARM  Result Value Ref Range Status   Specimen Description BLOOD LEFT ARM  Final   Special Requests   Final    BOTTLES DRAWN AEROBIC AND ANAEROBIC Blood Culture adequate volume   Culture   Final    NO GROWTH 4 DAYS Performed at [facility], [address] [place], [place]  [address]    Report Status PENDING  Incomplete  Culture, blood (Routine X 2)     Status: None (Preliminary result)   Collection Time: 10/23/21 11:47 AM   Specimen: BLOOD LEFT HAND  Result Value Ref Range Status   Specimen Description BLOOD LEFT HAND  Final   Special Requests   Final    BOTTLES DRAWN AEROBIC AND ANAEROBIC Blood Culture adequate volume   Culture   Final    NO GROWTH 4 DAYS Performed at [facility], [address], [place], [place] [address]    Report Status PENDING  Incomplete    Lipid Panel No results for input(s): "CHOL", "TRIG", "HDL", "CHOLHDL", "VLDL", "LDLCALC" in the last 72 hours.  Studies/Results: No results found.  Medications: Scheduled:  Chlorhexidine Gluconate Cloth  6 each Topical Daily   enoxaparin (LOVENOX) injection  1 mg/kg Subcutaneous Q12H   lactulose  20 g Per Tube Daily   OLANZapine  5 mg Per Tube QHS   mouth rinse  15 mL Mouth Rinse 4 times per day   pantoprazole  40 mg Per Tube Daily   [START ON 10/31/2021] thiamine (VITAMIN B1) injection  100 mg Intravenous Daily   Continuous:  dexmedetomidine (PRECEDEX) IV infusion Stopped (10/27/21 0437)   dextrose 5 % and 0.9% NaCl 50 mL/hr at 10/27/21 0800   feeding supplement (VITAL AF 1.2 CAL) Stopped (10/26/21 1100)   piperacillin-tazobactam (ZOSYN)  IV 12.5 mL/hr at 10/27/21  0800   thiamine (VITAMIN B1) injection Stopped (10/26/21 1127)    Assessment: 56 year old patient with a history of polysubstance abuse, hepatitis C and GERD who presented to outside hospital after being found unresponsive. He was intubated there and brought to [facility] for MRI, which revealed multiple acute infarcts in the bilateral cerebral hemispheres.  CTA showed no LVO or significant stenosis. The strokes on MRI are now felt to be a marker for more widespread neuronal damage not detectable by MRI, which would best explain the patient's decreased level of responsiveness. LP was performed by CCM to evaluate for infectious  causes of altered mental status. Meningitis panel was negative. - Very little information available about the incident that led to his hospitalization. Daughter is estranged. - UDS was positive for cocaine.  - LP showed mild pleiocytosis but CCM d/w ID who was not concerned about meningitis based on those results.   - EEGs: - EEG studies have persistently shown no epileptiform activity.  - LTM EEG (10/23/2021 1612 to 10/24/2021 1057): Continuous slow, generalized. This study is suggestive of mild diffuse encephalopathy, nonspecific to etiology. No seizures or epileptiform discharges were seen throughout the recording. - High suspicion for anoxic brain injury versus toxin-induced encephalopathy. MRI Brain demonstrates mild BL vasogenic cerebellar edema in addition to the strokes (?cocaine leukoencephalopathy vs hypomagnesemia, less likely CHANTERs) along with BL embolic appearing infarcts which do not seem to be in a watershed distribution. Overall, the cerebellar edema is typical for anoxic brain injury, while the findings supratentorially are atypical.    Impression: Acute toxic / metabolic encephalopathy vs. encephalitis in patient with acute ischemic strokes, likely cardioembolic   Recommendations: 1) Avoid deliriogenic medications and sedation if possible 2) Care of comorbidities per CCM  3) Continue high dose thiamine 4)  No immediate indication for addition of antiplatelets for secondary stroke prevention, as the patient is on therapeutic lovenox for DVT. When he is taken off anticoagulation, ASA should be started   35 minutes spent in the neurological evaluation and management of this critically ill patient.      LOS: 6 days   @Electronically  signed: Dr. 10/27/2021  8:59 AM

## 2021-10-27 NOTE — Evaluation (Addendum)
Physical Therapy Evaluation Patient Details Name: [name] MRN: [id number] DOB: May 02, 1965 Today's Date: 10/27/2021  History of Present Illness  Pt is 56 yo male admitted on to [facility] in [place] on 9/6 after being found unresponsive.  Pt transferred to [facility] on 10/21/21.  Pt with acute toxic metabolic encephalopathy vs encephalitis in pt with acute ischemic strokes per neurology.  9/11 R UE DVT started on lovenox, 9/14-9/16 chest tube, ETT 9/6-9/18.  Hx of polysubstance abuse, hep C, and GERD.  Clinical Impression  Pt admitted with above diagnosis. Unknown living situation or PLOF - but suspect pt was independent based of history and prior admissions.  Today, pt with eyes open but followed no commands with therapy.  Occasionally, would look at therapist but poor tracking.  Demonstrating good spontaneous movement bil LE, minimal L UE, and none R UE.  Pt with no trunk activation with trunk rotation, rolling, attempts to transition to long sitting.  Does have good PROM with no contractures noted.  Would need assist of 2 for EOB sitting and maximove for OOB at this time. Will attempt therapy as able to progress function.  Pt currently with functional limitations due to the deficits listed below (see PT Problem List). Pt will benefit from skilled PT to increase their independence and safety with mobility to allow discharge to the venue listed below.          Recommendations for follow up therapy are one component of a multi-disciplinary discharge planning process, led by the attending physician.  Recommendations may be updated based on patient status, additional functional criteria and insurance authorization.  Follow Up Recommendations Skilled nursing-short term rehab (<3 hours/day) Can patient physically be transported by private vehicle: No    Assistance Recommended at Discharge Frequent or constant Supervision/Assistance  Patient can return home with the following  Two people to help with  walking and/or transfers;Two people to help with bathing/dressing/bathroom;Assistance with cooking/housework;Assistance with feeding;Help with stairs or ramp for entrance    Equipment Recommendations Other (comment) (TBD)  Recommendations for Other Services       Functional Status Assessment Patient has had a recent decline in their functional status and demonstrates the ability to make significant improvements in function in a reasonable and predictable amount of time.     Precautions / Restrictions Precautions Precautions: Fall      Mobility  Bed Mobility Overal bed mobility: Needs Assistance Bed Mobility: Rolling Rolling: Total assist         General bed mobility comments: Attempted transition to long sitting in bed from [facility] elevated but pt not able to assist, no trunk activation; would need assist of 2 to progress (and still likely total assist)    Transfers                        Ambulation/Gait                  Stairs            Wheelchair Mobility    Modified Rankin (Stroke Patients Only) Modified Rankin (Stroke Patients Only) Pre-Morbid Rankin Score: No symptoms (suspected/unknown PLOF) Modified Rankin: Severe disability     Balance Overall balance assessment: Needs assistance   Sitting balance-Leahy Scale: Zero Sitting balance - Comments: Attempted transition to long sitting but unable  Pertinent Vitals/Pain Pain Assessment Pain Assessment: Faces Faces Pain Scale: No hurt    Home Living Family/patient expects to be discharged to:: Unsure                   Additional Comments: Pt unable to provide PLOF and no family present.  Suspect pt independent at baseline.    Prior Function                       Hand Dominance        Extremity/Trunk Assessment   Upper Extremity Assessment Upper Extremity Assessment: RUE deficits/detail;LUE deficits/detail RUE  Deficits / Details: No spontaneous movement observed; ROM WFL , no contractures noted, good scapula ROM LUE Deficits / Details: Spontaneously moved L elbow into flexion, no other active movements during session with cues or facilitation.  ROM WFL no contractures noted, good scapula ROM    Lower Extremity Assessment Lower Extremity Assessment: LLE deficits/detail;RLE deficits/detail RLE Deficits / Details: ROM WFL; Pt with spontaneous movement all joints in legs with at least 3/5 strength; not following commands LLE Deficits / Details: ROM WFL; Pt with spontaneous movement all joints in legs with at least 3/5 strength; not following commands    Cervical / Trunk Assessment Cervical / Trunk Assessment: Normal Cervical / Trunk Exceptions: normal for what able to assess in supine; Pt had good neck PROM and trunk rotation PROM; no trunk activation during session  Communication      Cognition Arousal/Alertness: Awake/alert Behavior During Therapy: Restless Overall Cognitive Status: Impaired/Different from baseline                                 General Comments: Pt with eyes open but not following commands.  Occasionally could make eye contact and turn eyes to therapist with max verbal stimuli but not consistent.  No verbalization during session        General Comments General comments (skin integrity, edema, etc.): VSS    Exercises     Assessment/Plan    PT Assessment Patient needs continued PT services  PT Problem List Decreased strength;Decreased mobility;Decreased safety awareness;Decreased range of motion;Decreased coordination;Decreased activity tolerance;Cardiopulmonary status limiting activity;Decreased cognition;Decreased balance;Decreased knowledge of use of DME       PT Treatment Interventions DME instruction;Therapeutic activities;Therapeutic exercise;Patient/family education;Balance training;Functional mobility training;Cognitive remediation;Neuromuscular  re-education;Wheelchair mobility training    PT Goals (Current goals can be found in the Care Plan section)  Acute Rehab PT Goals PT Goal Formulation: Patient unable to participate in goal setting Time For Goal Achievement: 11/10/21 Potential to Achieve Goals: Poor    Frequency Min 2X/week     Co-evaluation               AM-PAC PT "6 Clicks" Mobility  Outcome Measure Help needed turning from your back to your side while in a flat bed without using bedrails?: Total Help needed moving from lying on your back to sitting on the side of a flat bed without using bedrails?: Total Help needed moving to and from a bed to a chair (including a wheelchair)?: Total Help needed standing up from a chair using your arms (e.g., wheelchair or bedside chair)?: Total Help needed to walk in hospital room?: Total Help needed climbing 3-5 steps with a railing? : Total 6 Click Score: 6    End of Session   Activity Tolerance: Other (comment) (Limited by cognition and ability to follow  commands) Patient left: in bed;with call bell/phone within reach;with bed alarm set (heels floated) Nurse Communication: Mobility status PT Visit Diagnosis: Other abnormalities of gait and mobility (R26.89);Muscle weakness (generalized) (M62.81)    Time: 1210-1224 PT Time Calculation (min) (ACUTE ONLY): 14 min   Charges:   PT Evaluation $PT Eval Low Complexity: 1 Low          Sandrea Boer, PT Acute Rehab [facility] [phone]   Karlton Lemon 10/27/2021, 1:54 PM

## 2021-10-27 NOTE — Progress Notes (Signed)
Nutrition Follow-up  DOCUMENTATION CODES:   Not applicable  INTERVENTION:   Tube feeding via Cortrak tube: Change to Osmolite 1.5 at 60 ml/h (1440 ml per day). Prosource TF20 60 ml/h once daily.  Provides 2240 kcal, 110 gm protein, 1097 ml free water daily.  Free water flushes 200 ml every 4 hours for a total of 2297 ml free water daily.   NUTRITION DIAGNOSIS:   Inadequate oral intake related to inability to eat as evidenced by NPO status. Ongoing   GOAL:   Patient will meet greater than or equal to 90% of their needs. Will be met with initiation of TF via Cortrak tube.   MONITOR:   TF tolerance, Diet advancement, Labs  REASON FOR ASSESSMENT:   Ventilator, Consult Enteral/tube feeding initiation and management  ASSESSMENT:   56 yo male admitted to [facility] after being found down. Transferred to [facility] with concerns for anoxic brain injury (CT showed bilateral cerebellar edema). PMH includes polysubstance abuse, hepatitis C, GERD, smoker.  Extubated 9/18. Patient with ongoing toxic/metabolic encephalopathy, bilateral multifocal acute strokes. Meningitis panel negative. Neurology following. Patient is not able to follow commands. S/P bedside swallow evaluation with SLP 9/18. SLP recommends NPO status with temporary means of alternate nutrition. Cortrak placed today with tip in the stomach per x-ray. Plans to resume TF via Cortrak.   Labs reviewed.  CBG: 88-103  Medications reviewed and include lactulose, thiamine, IV antibiotics.  Admission weight 80.9 kg Current weight 72.5 kg  Net IO Since Admission: -4,689.23 mL [10/27/21 1451]  NUTRITION - FOCUSED PHYSICAL EXAM:  Unable to complete, RD working remotely.  Diet Order:   Diet Order     None       EDUCATION NEEDS:   No education needs have been identified at this time  Skin:  Skin Assessment: Reviewed RN Assessment  Last BM:  9/19 type 7  Height:   Ht Readings from Last 1 Encounters:  10/21/21 5'  10" (1.778 m)    Weight:   Wt Readings from Last 1 Encounters:  10/27/21 72.5 kg    Ideal Body Weight:  75.5 kg  BMI:  Body mass index is 22.93 kg/m.  Estimated Nutritional Needs:   Kcal:  2200-2400  Protein:  110-120 gm  Fluid:  2.2-2.4 L   Lucas Mallow RD, LDN, CNSC Please refer to Amion for contact information.

## 2021-10-27 NOTE — Progress Notes (Signed)
NAME:  [name], MRN:  [id number], DOB:  Apr 17, 1965, LOS: 6 ADMISSION DATE:  10/21/2021, CONSULTATION DATE:  9/15 REFERRING MD:  Riki Rusk, CHIEF COMPLAINT:  anoxic injury   History of Present Illness:  56 year old male with past medical history significant for polysubstance abuse, hepatitis C, and GERD who presented to [facility] in [place] on 9/6 after being found down.  He was last known well at approximately 1300 hrs. that day, however, EMS was called for wellness check at some time later on.  He was found to be down and unresponsive.  Upon EMS evaluation the patient was apneic with a thready pulse.  He had no response to intranasal Narcan.  He was bagged in route to the emergency department at which he arrived around 2300 hrs.  He was intubated immediately upon arrival to the ED.  CT of the head demonstrated cerebral edema without loss of white matter differentiation.  Repeat CT on 9/7 was read as normal by radiology, however, the neurologist still remained concerned for anoxic injury and recommended transfer to tertiary center for MRI, as the sending facility does not have ventilators or IV pumps compatible with MRI.  The patient was unable to transfer due to lack of available ICU beds.  The patient had been unable to wean from sedation due to significant agitation.  He would have nonpurposeful movement in the left upper and bilateral lower extremities.  No movement has been seen in his right upper extremity since presentation.  On 9/11 he was noted to have upper extremity edema on the right.  Ultrasound demonstrated acute thrombus in the mid and distal basilic vein.  Course also complicated by aspiration pneumonia for which he has completed a course of antibiotics.    On 9/13 he was transferred to [facility]   Pertinent  Medical History  GERD Head injury Hepatitis C Hiatal hernia Cocaine use (per daughter's report)  Significant Hospital Events: Including procedures,  antibiotic start and stop dates in addition to other pertinent events   9/6 found down > present to [facility] ED. CT with bilateral cerebellar edema. 9/7 repeat CT normal Unable to be weaned from sedation 9/11 RUE DVT started on tx dose lovenox 9/13 transfer to [facility] for MRI 9/14 chest tube placed with intrapleural fibrinolytic administration 9/16 chest tube removed 9/17 patient extubated, remained agitated and started on precedex  Interim History / Subjective:  Patient awake, off ventilator. He appears to be agitated and is moving his extremities, however, he is not following any of my commands at this time.   Objective   Blood pressure 90/72, pulse 65, temperature 97.9 F (36.6 C), temperature source Axillary, resp. rate 15, height 5\' 10"  (1.778 m), weight 72.5 kg, SpO2 100 %.    Vent Mode: PSV;CPAP FiO2 (%):  [40 %] 40 % PEEP:  [5 cmH20] 5 cmH20 Pressure Support:  [8 cmH20] 8 cmH20   Intake/Output Summary (Last 24 hours) at 10/27/2021 0646 Last data filed at 10/27/2021 0542 Gross per 24 hour  Intake 891.19 ml  Output 1665 ml  Net -773.81 ml   Filed Weights   10/25/21 0500 10/26/21 0500 10/27/21 0500  Weight: 73.4 kg 72.7 kg 72.5 kg    Examination: General: middle aged male, NAD, but agitated.  HENT: [place]/AT, eyes anicteric. Lungs: CTAB, normal work of breathing on [place] Cardiovascular: RRR, no murmurs Abdomen: soft, nontender, nondistended Extremities: Warm, dry. No edema Neuro: Awake with eyes open. Moving extremities spontaneously, however, is not following any commands. Mouths "  ow" in response to painful stimuli    Resolved Hospital Problem list     Assessment & Plan:  Acute toxic/metabolic encephalopathy in the setting of ischemic strokes MRI with evidence of multiple cerebral infarcts and cerebellar vasogenic edema. - Appreciate neurology input - Continue thiamine taper - Switch zyprexa to prn; start melatonin qhs  - Weaned off precedex overnight - Supportive care -  Avoid deliriogenic medications   Multifocal cerebral infarcts  Etiology likely vasospastic infarcts in the setting of cocaine use. Unclear significance of cerebellar vasogenic edema.  - Supportive care - Appreciate neurology input - Will need to start ASA once off anticoagulation    Right sided aspiration pneumonia with parapneumonic effusion  S/p chest tube placement with intrapleural lytics on 9/14, chest tube removed 9/16. WBC count downtrending to 13.6 today.  - Continue Zosyn for 7 day total course (to end 9/20)   Acute hypoxemic respiratory failure secondary to aspiration pneumonia Patient extubated on 9/18, satting well on nasal cannula. Respirations are unlabored - Maintain SpO2 >92%   Superficial vein thrombosis Diagnosed 9/11 with R basilic vein thrombus  - Continue therapeutic Lovenox  Thrombocytosis Platelets stable at 725. - Daily CBC - Will need ASA therapy once off anticoagulation    Protein calorie malnutrition SLP evaluated patient on 9/18, patient remains a high risk for aspiration events. - Cortrak to be placed temporarily    Mild transaminitis  Hepatitis C AST 65, ALT 94. Ammonia 48. - Lactulose daily    Best Practice (right click and "Reselect all SmartList Selections" daily)   Diet/type: pending Cortrak placement DVT prophylaxis: systemic dose LMWH GI prophylaxis: PPI Lines: N/A Foley:  N/A Code Status:  full code Last date of multidisciplinary goals of care discussion [9/18]  Dispo: Remain in ICU; can likely transfer out by tomorrow if remains off precedex  Labs   CBC: Recent Labs  Lab 10/22/21 0049 10/23/21 0619 10/24/21 0651 10/25/21 0339 10/26/21 0644  WBC 20.5* 17.4* 19.6* 18.3* 16.5*  HGB 8.8* 12.3* 12.2* 12.2* 12.3*  HCT 26.3* 37.3* 36.7* 36.9* 37.7*  MCV 98.9 96.6 97.6 97.6 97.7  PLT 619* 583* 653* 729* 736*    Basic Metabolic Panel: Recent Labs  Lab 10/22/21 0049 10/23/21 0619 10/24/21 0651 10/25/21 0339  10/26/21 0644  NA 140 143 143 142 144  K 4.1 4.0 4.2 4.0 3.8  CL 107 107 108 109 110  CO2 27 24 21* 22 23  GLUCOSE 120* 130* 117* 114* 133*  BUN 30* 34* 37* 40* 39*  CREATININE 1.10 0.87 0.87 0.88 0.81  CALCIUM 8.5* 8.8* 8.8* 8.9 9.3  MG 2.7* 2.5* 2.5* 2.6* 2.4  PHOS 4.1 3.5 4.0 3.9 3.0   GFR: Estimated Creatinine Clearance: 103.2 mL/min (by C-G formula based on SCr of 0.81 mg/dL). Recent Labs  Lab 10/22/21 0049 10/23/21 0619 10/24/21 0651 10/25/21 0339 10/26/21 0644  PROCALCITON 0.58  --   --   --   --   WBC 20.5* 17.4* 19.6* 18.3* 16.5*    Liver Function Tests: Recent Labs  Lab 10/22/21 0049 10/24/21 1807  AST 65* 46*  ALT 94* 86*  ALKPHOS 186* 166*  BILITOT 0.8 0.6  PROT 7.1 7.2  ALBUMIN 2.0* 2.2*   No results for input(s): "LIPASE", "AMYLASE" in the last 168 hours. Recent Labs  Lab 10/22/21 1151 10/24/21 1807  AMMONIA 48* 32    ABG    Component Value Date/Time   PHART 7.444 10/21/2021 2335   PCO2ART 38.3 10/21/2021 2335  PO2ART 85 10/21/2021 2335   HCO3 26.1 10/21/2021 2335   TCO2 27 10/21/2021 2335   O2SAT 96 10/21/2021 2335     Coagulation Profile: Recent Labs  Lab 10/22/21 0049  INR 1.0    Cardiac Enzymes: No results for input(s): "CKTOTAL", "CKMB", "CKMBINDEX", "TROPONINI" in the last 168 hours.  HbA1C: Hgb A1c MFr Bld  Date/Time Value Ref Range Status  10/22/2021 08:02 PM 5.5 4.8 - 5.6 % Final    Comment:    (NOTE) Pre diabetes:          5.7%-6.4%  Diabetes:              >6.4%  Glycemic control for   <7.0% adults with diabetes   02/14/2019 09:11 AM 5.5 4.8 - 5.6 % Final    Comment:             Prediabetes: 5.7 - 6.4          Diabetes: >6.4          Glycemic control for adults with diabetes: <7.0     CBG: Recent Labs  Lab 10/26/21 1115 10/26/21 1532 10/26/21 1929 10/26/21 2323 10/27/21 0313  GLUCAP 101* 107* 92 95 87    Review of Systems:   Unable to obtain  Past Medical History:  He,  has a past medical  history of Acid reflux, Head injury, Hepatitis C, and Hiatal hernia.   Surgical History:   Past Surgical History:  Procedure Laterality Date   ACROMIO-CLAVICULAR JOINT REPAIR Right 06/28/2018   Procedure: ACROMIO-CLAVICULAR JOINT IRRIGATION AND DEBRIDEMENT;  Surgeon: [name], MD;  Location: [place];  Service: Orthopedics;  Laterality: Right;   FACIAL FRACTURE SURGERY     IRRIGATION AND DEBRIDEMENT SHOULDER Right 06/28/2018   Procedure: IRRIGATION AND DEBRIDEMENT SHOULDER;  Surgeon: [name], MD;  Location: [place];  Service: Orthopedics;  Laterality: Right;   KNEE ARTHROSCOPY Left 06/23/2018   Procedure: LEFT KNEE ARTHROSCOPY KNEE I&D.;  Surgeon: [name], MD;  Location: [facility];  Service: Orthopedics;  Laterality: Left;     Social History:   reports that he has been smoking cigarettes. He has a 20.00 pack-year smoking history. He has never used smokeless tobacco. He reports current alcohol use. He reports that he does not currently use drugs after having used the following drugs: Cocaine.   Family History:  His family history includes Diabetes in his mother; Hypertension in his father and mother.   Allergies No Known Allergies   Home Medications  Prior to Admission medications   Medication Sig Start Date End Date Taking? Authorizing Provider  acetaminophen (TYLENOL) 160 MG/5ML elixir Place 15 mg/kg into feeding tube every 6 (six) hours as needed for fever or pain.   Yes [provider]  Alcohol, USP (ETHYL ALCOHOL) 95 % SOLN Place 1 Swab into the nose 2 (two) times daily. Swab each nostril twice daily   Yes [provider]  chlordiazePOXIDE (LIBRIUM) 25 MG capsule Place 25 mg into feeding tube 3 (three) times daily.   Yes [provider]  chlorhexidine (PERIDEX) 0.12 % solution Use as directed 15 mLs in the mouth or throat 2 (two) times daily.   Yes [provider]  DEXMEDETOMIDINE HCL IV Inject 100 mLs into the vein See  admin instructions. MAR from [facility] does not include concentration: Dexmedetomidin 173ml every 6 hours and 54 minutes, IV drip   Yes [provider]  enoxaparin (LOVENOX) 80 MG/0.8ML injection Inject 80 mg into  the skin daily.   Yes [provider]  folic acid (FOLVITE) 1 MG tablet Place 1 mg into feeding tube daily.   Yes [provider]  Multiple Vitamin (MULTIVITAMIN) tablet Place 1 tablet into feeding tube daily.   Yes [provider]  OLANZapine (ZYPREXA) 5 MG tablet Place 5 mg into feeding tube 2 (two) times daily.   Yes [provider]  pantoprazole (PROTONIX) 40 MG injection Inject 40 mg into the vein daily.   Yes [provider]  propofol (DIPRIVAN) 1000 MG/100ML EMUL injection Inject 1,000 mg into the vein See admin instructions. Propofol 1000 mg / 100 ml at 10.031 ml/hr over 9 hours 59 minutes   Yes [provider]  scopolamine (TRANSDERM-SCOP) 1 MG/3DAYS Place 1 patch onto the skin every 3 (three) days.   Yes [provider]  Sofosbuvir-Velpatasvir (EPCLUSA) 400-100 MG TABS Take 1 tablet by mouth daily. 10/11/18  Yes Cliffton Astersampbell, John, MD  thiamine (VITAMIN B1) 100 MG tablet Place 100 mg into feeding tube daily.   Yes [provider]     Critical care time: 25 mins

## 2021-10-27 NOTE — Procedures (Signed)
Cortrak  Tube Type:  Cortrak - 43 inches Tube Location:  Left nare Initial Placement:  Stomach Secured by: Bridle Technique Used to Measure Tube Placement:  Marking at nare/corner of mouth Cortrak Secured At:  70 cm  Cortrak Tube Team Note:  Consult received to place a Cortrak feeding tube.   X-ray is required, abdominal x-ray has been ordered by the Cortrak team. Please confirm tube placement before using the Cortrak tube.   If the tube becomes dislodged please keep the tube and contact the Cortrak team at [email or web address] ([email or web address]) for replacement.  If after hours and replacement cannot be delayed, place a NG tube and confirm placement with an abdominal x-ray.    Chaden Doom MS, RD, LDN Please refer to AMION for RD and/or RD on-call/weekend/after hours pager   

## 2021-10-28 ENCOUNTER — Inpatient Hospital Stay (HOSPITAL_COMMUNITY): Payer: Medicaid Other

## 2021-10-28 ENCOUNTER — Other Ambulatory Visit (HOSPITAL_COMMUNITY): Payer: Medicaid Other

## 2021-10-28 ENCOUNTER — Other Ambulatory Visit (HOSPITAL_COMMUNITY): Payer: Self-pay

## 2021-10-28 ENCOUNTER — Other Ambulatory Visit (HOSPITAL_COMMUNITY): Payer: Self-pay | Admitting: Interventional Radiology

## 2021-10-28 DIAGNOSIS — G9340 Encephalopathy, unspecified: Secondary | ICD-10-CM | POA: Diagnosis not present

## 2021-10-28 DIAGNOSIS — I639 Cerebral infarction, unspecified: Secondary | ICD-10-CM | POA: Diagnosis not present

## 2021-10-28 DIAGNOSIS — K5730 Diverticulosis of large intestine without perforation or abscess without bleeding: Secondary | ICD-10-CM | POA: Diagnosis not present

## 2021-10-28 LAB — VOLATILES,BLD-ACETONE,ETHANOL,ISOPROP,METHANOL
Acetone, blood: <0.01 g/dL (ref 0.000–0.010)
Ethanol, blood: <0.01 g/dL (ref 0.000–0.010)
Isopropanol, blood: <0.01 g/dL (ref 0.000–0.010)
Methanol, blood: <0.01 g/dL (ref 0.000–0.010)

## 2021-10-28 LAB — CULTURE, BLOOD (ROUTINE X 2)
Culture: NO GROWTH
Culture: NO GROWTH
Special Requests: ADEQUATE
Special Requests: ADEQUATE

## 2021-10-28 LAB — GLUCOSE, CAPILLARY
Glucose-Capillary: 107 mg/dL — ABNORMAL HIGH (ref 70–99)
Glucose-Capillary: 108 mg/dL — ABNORMAL HIGH (ref 70–99)
Glucose-Capillary: 113 mg/dL — ABNORMAL HIGH (ref 70–99)
Glucose-Capillary: 119 mg/dL — ABNORMAL HIGH (ref 70–99)
Glucose-Capillary: 124 mg/dL — ABNORMAL HIGH (ref 70–99)
Glucose-Capillary: 132 mg/dL — ABNORMAL HIGH (ref 70–99)
Glucose-Capillary: 96 mg/dL (ref 70–99)

## 2021-10-28 LAB — CYTOLOGY - NON PAP

## 2021-10-28 LAB — VITAMIN B1: Vitamin B1 (Thiamine): 152.5 nmol/L (ref 66.5–200.0)

## 2021-10-28 LAB — CARBON MONOXIDE, BLOOD (PERFORMED AT REF LAB): Carbon Monoxide, Blood: 4.4 % — ABNORMAL HIGH (ref 0.0–3.6)

## 2021-10-28 MED ORDER — ASPIRIN 81 MG PO CHEW
81.0000 mg | CHEWABLE_TABLET | Freq: Every day | ORAL | Status: DC
  Administered 2021-10-28: 81 mg
  Filled 2021-10-28: qty 1

## 2021-10-28 MED ORDER — PANTOPRAZOLE 2 MG/ML SUSPENSION
40.0000 mg | Freq: Every day | ORAL | Status: DC
  Administered 2021-10-28 – 2021-11-11 (×15): 40 mg
  Filled 2021-10-28 (×15): qty 20

## 2021-10-28 NOTE — TOC Progression Note (Addendum)
Transition of Care [facility]) - Progression Note    Patient Details  Name: [name] MRN: [id number] Date of Birth: 1965-05-27  Transition of Care [facility] CM/SW [place], RN Phone [phone]  10/28/2021, 3:43 PM  Clinical Narrative:    TOC continues to follow. SW following  for  SNF workup when medically appropriate. Currently has cortrack with tube feeds, IV antibiotics and concern for anoxic brain injury.          Expected Discharge Plan and Services                                                 Social Determinants of Health (SDOH) Interventions    Readmission Risk Interventions     No data to display

## 2021-10-28 NOTE — Progress Notes (Signed)
Subjective: Lying in bed awake and nonverbal with occasional spontaneous limb movements  Objective: Current vital signs: BP (!) 162/109   Pulse 94   Temp 98.3 F (36.8 C) (Axillary)   Resp 17   Ht  (1.778 m)   Wt 72.2 kg   SpO2 96%   BMI 22.84 kg/m  Vital signs in last 24 hours: Temp:  [97.8 F (36.6 C)-98.3 F (36.8 C)] 98.3 F (36.8 C) (09/20 0337) Pulse Rate:  [75-99] 94 (09/20 0700) Resp:  [14-25] 17 (09/20 0700) BP: (112-162)/(84-122) 162/109 (09/20 0700) SpO2:  [94 %-100 %] 96 % (09/20 0700) Weight:  [72.2 kg] 72.2 kg (09/20 0500)  Intake/Output from previous day: 09/19 0701 - 09/20 0700 In: 2150.8 [I.V.:456.5; NW/GN:5621; IV Piggyback:220.3] Out: 1200 [Urine:1200] Intake/Output this shift: No intake/output data recorded. Nutritional status:  Diet Order     None       HEENT: [place]/AT  Lungs: Respirations unlabored Ext: No edema   Neurologic Exam: Ment: Awake with eyes open and glancing eye movements but does not fixate on examiner's face or any other visual stimuli. Moves mouth as though murmuring or vocalizing but no vocalizations are audible.  Does not follow any commands. Moving bilateral lower extremities spontaneously with repetitive fidgeting movements. Does not move arms to pinch.  CN: PERRL. Roving EOM are conjugate without gaze preference or nystagmus. Face is symmetric.  Motor/Sensory: Moving bilateral lower extremities spontaneously with repetitive fidgeting movements. Does not move arms to pinch.  No asymmetry noted. Does not follow commands for formal strength testing. Tone is decreased in upper extremities.  Reflexes: 2+ in upper and lower extremities Cerebellar/Gait: Unable to assess  Lab Results: Results for orders placed or performed during the hospital encounter of 10/21/21 (from the past 48 hour(s))  Glucose, capillary     Status: Abnormal   Collection Time: 10/26/21  8:05 AM  Result Value Ref Range   Glucose-Capillary 126 (H) 70 -  99 mg/dL    Comment: Glucose reference range applies only to samples taken after fasting for at least 8 hours.  Glucose, capillary     Status: Abnormal   Collection Time: 10/26/21 11:15 AM  Result Value Ref Range   Glucose-Capillary 101 (H) 70 - 99 mg/dL    Comment: Glucose reference range applies only to samples taken after fasting for at least 8 hours.  Glucose, capillary     Status: Abnormal   Collection Time: 10/26/21  3:32 PM  Result Value Ref Range   Glucose-Capillary 107 (H) 70 - 99 mg/dL    Comment: Glucose reference range applies only to samples taken after fasting for at least 8 hours.  Glucose, capillary     Status: None   Collection Time: 10/26/21  7:29 PM  Result Value Ref Range   Glucose-Capillary 92 70 - 99 mg/dL    Comment: Glucose reference range applies only to samples taken after fasting for at least 8 hours.  Glucose, capillary     Status: None   Collection Time: 10/26/21 11:23 PM  Result Value Ref Range   Glucose-Capillary 95 70 - 99 mg/dL    Comment: Glucose reference range applies only to samples taken after fasting for at least 8 hours.  Glucose, capillary     Status: None   Collection Time: 10/27/21  3:13 AM  Result Value Ref Range   Glucose-Capillary 87 70 - 99 mg/dL    Comment: Glucose reference range applies only to samples taken after fasting for at least 8  hours.  Basic metabolic panel     Status: Abnormal   Collection Time: 10/27/21  6:56 AM  Result Value Ref Range   Sodium 143 135 - 145 mmol/L   Potassium 4.2 3.5 - 5.1 mmol/L   Chloride 109 98 - 111 mmol/L   CO2 25 22 - 32 mmol/L   Glucose, Bld 89 70 - 99 mg/dL    Comment: Glucose reference range applies only to samples taken after fasting for at least 8 hours.   BUN 39 (H) 6 - 20 mg/dL   Creatinine, Ser 9.62 0.61 - 1.24 mg/dL   Calcium 9.4 8.9 - 22.9 mg/dL   GFR, Estimated >79 >89 mL/min    Comment: (NOTE) Calculated using the CKD-EPI Creatinine Equation (2021)    Anion gap 9 5 - 15     Comment: Performed at [facility], [address], [place], [place] [address]  CBC     Status: Abnormal   Collection Time: 10/27/21  6:56 AM  Result Value Ref Range   WBC 13.6 (H) 4.0 - 10.5 K/uL   RBC 4.05 (L) 4.22 - 5.81 MIL/uL   Hemoglobin 13.0 13.0 - 17.0 g/dL   HCT 17.4 08.1 - 44.8 %   MCV 101.5 (H) 80.0 - 100.0 fL   MCH 32.1 26.0 - 34.0 pg   MCHC 31.6 30.0 - 36.0 g/dL   RDW 18.5 63.1 - 49.7 %   Platelets 725 (H) 150 - 400 K/uL   nRBC 0.0 0.0 - 0.2 %    Comment: Performed at [facility], [address] [place], [place] [address]  Magnesium     Status: Abnormal   Collection Time: 10/27/21  6:56 AM  Result Value Ref Range   Magnesium 2.5 (H) 1.7 - 2.4 mg/dL    Comment: Performed at [facility], [address], [place], [place] [address]  Phosphorus     Status: None   Collection Time: 10/27/21  6:56 AM  Result Value Ref Range   Phosphorus 3.9 2.5 - 4.6 mg/dL    Comment: Performed at [facility], [address] [place], [place] [address]  Glucose, capillary     Status: None   Collection Time: 10/27/21  7:29 AM  Result Value Ref Range   Glucose-Capillary 88 70 - 99 mg/dL    Comment: Glucose reference range applies only to samples taken after fasting for at least 8 hours.  Glucose, capillary     Status: Abnormal   Collection Time: 10/27/21 11:14 AM  Result Value Ref Range   Glucose-Capillary 103 (H) 70 - 99 mg/dL    Comment: Glucose reference range applies only to samples taken after fasting for at least 8 hours.  Glucose, capillary     Status: Abnormal   Collection Time: 10/27/21  4:55 PM  Result Value Ref Range   Glucose-Capillary 119 (H) 70 - 99 mg/dL    Comment: Glucose reference range applies only to samples taken after fasting for at least 8 hours.  Glucose, capillary     Status: Abnormal   Collection Time: 10/27/21  7:24 PM  Result Value Ref Range   Glucose-Capillary 135 (H) 70 - 99 mg/dL    Comment: Glucose reference range applies only  to samples taken after fasting for at least 8 hours.  Glucose, capillary     Status: Abnormal   Collection Time: 10/27/21 11:30 PM  Result Value Ref Range   Glucose-Capillary 153 (H) 70 - 99 mg/dL  Comment: Glucose reference range applies only to samples taken after fasting for at least 8 hours.  Glucose, capillary     Status: Abnormal   Collection Time: 10/28/21  3:23 AM  Result Value Ref Range   Glucose-Capillary 107 (H) 70 - 99 mg/dL    Comment: Glucose reference range applies only to samples taken after fasting for at least 8 hours.    Recent Results (from the past 240 hour(s))  MRSA Next Gen by PCR, Nasal     Status: None   Collection Time: 10/21/21 11:01 PM   Specimen: Nasal Mucosa; Nasal Swab  Result Value Ref Range Status   MRSA by PCR Next Gen NOT DETECTED NOT DETECTED Final    Comment: (NOTE) The GeneXpert MRSA Assay (FDA approved for NASAL specimens only), is one component of a comprehensive MRSA colonization surveillance program. It is not intended to diagnose MRSA infection nor to guide or monitor treatment for MRSA infections. Test performance is not FDA approved in patients less than 10 years old. Performed at [facility], [address] [place], [place] [address]   Culture, blood (Routine X 2) w Reflex to ID Panel     Status: None   Collection Time: 10/22/21 12:44 AM   Specimen: BLOOD LEFT HAND  Result Value Ref Range Status   Specimen Description BLOOD LEFT HAND  Final   Special Requests   Final    BOTTLES DRAWN AEROBIC ONLY Blood Culture results may not be optimal due to an inadequate volume of blood received in culture bottles   Culture   Final    NO GROWTH 5 DAYS Performed at [facility], [address] [place], [place] [address]    Report Status 10/27/2021 FINAL  Final  Culture, blood (Routine X 2) w Reflex to ID Panel     Status: None   Collection Time: 10/22/21 12:51 AM   Specimen: BLOOD  Result Value Ref Range Status   Specimen  Description BLOOD LEFT ANTECUBITAL  Final   Special Requests   Final    BOTTLES DRAWN AEROBIC AND ANAEROBIC Blood Culture adequate volume   Culture   Final    NO GROWTH 5 DAYS Performed at [facility], [address] [place], [place] [address]    Report Status 10/27/2021 FINAL  Final  Expectorated Sputum Assessment w Gram Stain, Rflx to Resp Cult     Status: None   Collection Time: 10/22/21 10:06 AM   Specimen: Expectorated Sputum  Result Value Ref Range Status   Specimen Description EXPECTORATED SPUTUM  Final   Special Requests NONE  Final   Sputum evaluation   Final    THIS SPECIMEN IS ACCEPTABLE FOR SPUTUM CULTURE Performed at [facility], [address], [place], [place] [address]    Report Status 10/26/2021 FINAL  Final  C Difficile Quick Screen w PCR reflex     Status: None   Collection Time: 10/22/21 10:06 AM   Specimen: STOOL  Result Value Ref Range Status   C Diff antigen NEGATIVE NEGATIVE Final   C Diff toxin NEGATIVE NEGATIVE Final   C Diff interpretation No C. difficile detected.  Final    Comment: Performed at [facility], [address] [place], [place] [address]  Culture, Respiratory w Gram Stain     Status: None   Collection Time: 10/22/21 10:06 AM  Result Value Ref Range Status   Specimen Description EXPECTORATED SPUTUM  Final   Special  Requests NONE Reflexed from [id number]  Final   Gram Stain   Final    RARE WBC PRESENT,BOTH PMN AND MONONUCLEAR FEW GRAM NEGATIVE RODS    Culture   Final    ABUNDANT PSEUDOMONAS AERUGINOSA NO STAPHYLOCOCCUS AUREUS ISOLATED Performed at [facility], [address], [place], [place] [address]    Report Status 10/25/2021 FINAL  Final   Organism ID, Bacteria PSEUDOMONAS AERUGINOSA  Final      Susceptibility   Pseudomonas aeruginosa - MIC*    CEFTAZIDIME 4 SENSITIVE Sensitive     CIPROFLOXACIN 0.5 SENSITIVE Sensitive     GENTAMICIN <=1 SENSITIVE Sensitive     IMIPENEM 2 SENSITIVE Sensitive      PIP/TAZO <=4 SENSITIVE Sensitive     CEFEPIME 2 SENSITIVE Sensitive     * ABUNDANT PSEUDOMONAS AERUGINOSA  Body fluid culture w Gram Stain     Status: None   Collection Time: 10/22/21 11:42 AM   Specimen: Pleural Fluid  Result Value Ref Range Status   Specimen Description FLUID  Final   Special Requests NONE  Final   Gram Stain NO WBC SEEN NO ORGANISMS SEEN   Final   Culture   Final    NO GROWTH 3 DAYS Performed at [facility], [address] [place], [place] [address]    Report Status 10/25/2021 FINAL  Final  Fungus Culture With Stain     Status: None (Preliminary result)   Collection Time: 10/22/21 11:42 AM   Specimen: Pleural Fluid  Result Value Ref Range Status   Fungus Stain Final report  Final    Comment: (NOTE) Performed At: [facility] [address] [place], [place] [id number] Jolene Schimke MD [id number]    Fungus (Mycology) Culture PENDING  Incomplete   Fungal Source FLUID  Final    Comment: Performed at [facility], [address], [place], [place] [address]  Fungus Culture Result     Status: None   Collection Time: 10/22/21 11:42 AM  Result Value Ref Range Status   Result 1 Comment  Final    Comment: (NOTE) KOH/Calcofluor preparation:  no fungus observed. Performed At: [facility] [address] [place], [place] [id number] Jolene Schimke MD [id number]   CSF culture w Gram Stain     Status: None   Collection Time: 10/23/21 10:31 AM   Specimen: CSF; Cerebrospinal Fluid  Result Value Ref Range Status   Specimen Description CSF  Final   Special Requests NONE  Final   Gram Stain   Final    NO ORGANISMS SEEN RARE WBC PRESENT,BOTH PMN AND MONONUCLEAR    Culture   Final    NO GROWTH 3 DAYS Performed at [facility], [address] [place], [place] [address]    Report Status 10/26/2021 FINAL  Final  Culture, blood (Routine X 2)     Status: None (Preliminary result)   Collection Time: 10/23/21 11:42 AM    Specimen: BLOOD LEFT ARM  Result Value Ref Range Status   Specimen Description BLOOD LEFT ARM  Final   Special Requests   Final    BOTTLES DRAWN AEROBIC AND ANAEROBIC Blood Culture adequate volume   Culture   Final    NO GROWTH 4 DAYS Performed at [facility], [address] [place], [place] [address]    Report Status PENDING  Incomplete  Culture, blood (Routine X 2)     Status: None (Preliminary result)   Collection Time: 10/23/21  11:47 AM   Specimen: BLOOD LEFT HAND  Result Value Ref Range Status   Specimen Description BLOOD LEFT HAND  Final   Special Requests   Final    BOTTLES DRAWN AEROBIC AND ANAEROBIC Blood Culture adequate volume   Culture   Final    NO GROWTH 4 DAYS Performed at [facility], [address] [place], [place] [address]    Report Status PENDING  Incomplete    Lipid Panel No results for input(s): "CHOL", "TRIG", "HDL", "CHOLHDL", "VLDL", "LDLCALC" in the last 72 hours.  Studies/Results: DG Abd Portable 1V  Result Date: 10/27/2021 CLINICAL DATA:  Feeding tube placement EXAM: PORTABLE ABDOMEN - 1 VIEW COMPARISON:  None Available. FINDINGS: Endotracheal tube overlies the midthoracic trachea. Orogastric tube passes below the diaphragm, tip and side port overlie the gastric fundus. Unchanged cardiomediastinal silhouette. There is a small right pleural effusion and adjacent right basilar opacities. Left lung is clear. No evidence of pneumothorax. Bones are unchanged. IMPRESSION: Stable small right pleural effusion with adjacent basilar atelectasis. No evidence of pneumothorax. Orogastric tube tip and side port overlie the gastric fundus. Electronically Signed   By: Maurine Simmering M.D.   On: 10/27/2021 11:58    Medications: Scheduled:  Chlorhexidine Gluconate Cloth  6 each Topical Daily   enoxaparin (LOVENOX) injection  70 mg Subcutaneous Q12H   feeding supplement (PROSource TF20)  60 mL Per Tube Daily   free water  200 mL Per Tube Q4H   lactulose  20 g  Per Tube Daily   melatonin  5 mg Per Tube QHS   mouth rinse  15 mL Mouth Rinse 4 times per day   [START ON 10/31/2021] thiamine (VITAMIN B1) injection  100 mg Intravenous Daily   Continuous:  feeding supplement (OSMOLITE 1.5 CAL) Stopped (10/28/21 0230)   piperacillin-tazobactam (ZOSYN)  IV 12.5 mL/hr at 10/28/21 0700   thiamine (VITAMIN B1) injection Stopped (10/27/21 1020)   Assessment: 56 year old patient with a history of polysubstance abuse, hepatitis C and GERD who presented to outside hospital after being found unresponsive. He was intubated there and brought to [facility] for MRI, which revealed multiple acute infarcts in the bilateral cerebral hemispheres.  CTA showed no LVO or significant stenosis. The strokes on MRI are now felt to be a marker for more widespread neuronal damage not detectable by MRI, which would best explain the patient's decreased level of responsiveness. LP was performed by CCM to evaluate for infectious causes of altered mental status. Meningitis panel was negative. There is very little information available about the incident that led to his hospitalization. Daughter is estranged. UDS was positive for cocaine.  - Today's exam: Unchanged relative to yesterday.   - LP showed mild pleiocytosis but CCM d/w ID who was not concerned about meningitis based on those results.   - EEGs: - EEG studies have persistently shown no epileptiform activity.  - LTM EEG (10/23/2021 1612 to 10/24/2021 1057): Continuous slow, generalized. This study is suggestive of mild diffuse encephalopathy, nonspecific to etiology. No seizures or epileptiform discharges were seen throughout the recording. - High suspicion for anoxic brain injury versus toxin-induced encephalopathy. MRI Brain demonstrates mild BL vasogenic cerebellar edema in addition to the strokes (?cocaine leukoencephalopathy vs hypomagnesemia, less likely CHANTERs) along with BL embolic appearing infarcts which do not seem to be in a  watershed distribution. Overall, the cerebellar edema is typical for anoxic brain injury, while the findings supratentorially are atypical.    Impression: Acute toxic /  metabolic encephalopathy vs. encephalitis in patient with acute ischemic strokes, likely cardioembolic   Recommendations: 1) Avoid deliriogenic medications and sedation if possible 2) Care of comorbidities per CCM  3) Continue high dose thiamine 4)  No immediate indication for addition of antiplatelets for secondary stroke prevention, as the patient is on therapeutic lovenox for DVT. When he is taken off anticoagulation, ASA should be started   LOS: 7 days   @Electronically  signed: Dr. Caryl PinaEric Brittny Spangle@ 10/28/2021  7:30 AM

## 2021-10-28 NOTE — Progress Notes (Signed)
Speech Language Pathology Treatment: Dysphagia  Patient Details Name: [name] MRN: [id number] DOB: 1966/02/06 Today's Date: 10/28/2021 Time: 1000-1010 SLP Time Calculation (min) (ACUTE ONLY): 10 min  Assessment / Plan / Recommendation Clinical Impression  Pt demonstrates improving attention to PO. Initially pt turned head away from ice and was orally defensive, but after it touched his lips pt eager accepted the rest of session. RN reports that pt had vomited in the night and tube feeds are on hold at time of assessment so only light trials attempted. Pt had improved oral manipulation and consistent swallow. Unsure if pt would be able to sip from a cup edge yet, likely to refuse barium. Pt still not speaking or following commands. Eyes are roving around right visual field. Occasionally pt makes eye contact and laughs. Will continue to follow for readiness.   HPI HPI: 56 year old male with past medical history significant for polysubstance abuse, hepatitis C, and GERD who presented to [facility] in [place] on 9/6 after being found down. He was bagged in route to the emergency department at which he arrived around 2300 hrs.  He was intubated immediately upon arrival to the ED 9/6.  CT of the head demonstrated cerebral edema without loss of white matter differentiation.  MRI shows Multifocal, primarily small acute infarcts involving bilateral cerebral hemispheres.. Extubated 9/18.      SLP Plan  Continue with current plan of care      Recommendations for follow up therapy are one component of a multi-disciplinary discharge planning process, led by the attending physician.  Recommendations may be updated based on patient status, additional functional criteria and insurance authorization.    Recommendations  Diet recommendations: NPO                Follow Up Recommendations: Skilled nursing-short term rehab (<3 hours/day) Plan: Continue with current plan of care            Trinidee Schrag, Katherene Ponto  10/28/2021, 11:04 AM

## 2021-10-28 NOTE — Evaluation (Signed)
Occupational Therapy Evaluation Patient Details Name: [name] MRN: [id number] DOB: 12-03-1965 Today's Date: 10/28/2021   History of Present Illness Pt is 56 yo male admitted on to [facility] health in [place] on 9/6 after being found unresponsive.  Pt transferred to [facility] on 10/21/21.  Pt with acute toxic metabolic encephalopathy vs encephalitis in pt with acute ischemic strokes per neurology.  9/11 R UE DVT started on lovenox, 9/14-9/16 chest tube, ETT 9/6-9/18.  Hx of polysubstance abuse, hep C, and GERD.   Clinical Impression   Pt is unreliable historian, but assume independent in mobility and ADL. Today he is total A for all aspects of ADL, total A +2 for all aspects of coming EOB, mod A to maintain sitting with support on forehead for head control assist, and max to total A +2 for sit<>stand from EOB. Eyes are roving and non-focal with R sided preference, but will go to left given increased time and max stimulation. He does blink to threat on the left. One slight shoulder shrug on the right, but otherwise no movement observed in RUE, LUE moves spontaneously but without purpose. He was unable to remove a washcloth from his face. PROM is WFL at shoulder, elbow, and digits at this time. He is not following commands at this time during session, with the exception of one instance where he had approx 1 min delay. He will benefit from skilled OT in the acute setting as well as afterwards at SNF level.       Recommendations for follow up therapy are one component of a multi-disciplinary discharge planning process, led by the attending physician.  Recommendations may be updated based on patient status, additional functional criteria and insurance authorization.   Follow Up Recommendations  Skilled nursing-short term rehab (<3 hours/day)    Assistance Recommended at Discharge Frequent or constant Supervision/Assistance  Patient can return home with the following Two people to help with walking and/or  transfers;A lot of help with bathing/dressing/bathroom;Assistance with cooking/housework;Assistance with feeding;Direct supervision/assist for medications management;Direct supervision/assist for financial management;Assist for transportation;Help with stairs or ramp for entrance    Functional Status Assessment  Patient has had a recent decline in their functional status and/or demonstrates limited ability to make significant improvements in function in a reasonable and predictable amount of time  Equipment Recommendations  Hospital bed;Wheelchair (measurements OT);Wheelchair cushion (measurements OT);BSC/3in1;Other (comment) (hoyer lift)    Recommendations for Other Services PT consult;Speech consult     Precautions / Restrictions Precautions Precautions: Fall Restrictions Weight Bearing Restrictions: No      Mobility Bed Mobility Overal bed mobility: Needs Assistance Bed Mobility: Supine to Sit, Sit to Supine     Supine to sit: Total assist, +2 for safety/equipment, +2 for physical assistance Sit to supine: Total assist, +2 for physical assistance, +2 for safety/equipment   General bed mobility comments: total A +2 for all aspects of bed mobility    Transfers Overall transfer level: Needs assistance Equipment used: 2 person hand held assist Transfers: Sit to/from Stand Sit to Stand: Max assist, +2 physical assistance, +2 safety/equipment, From elevated surface, Total assist           General transfer comment: Ptr able to take some weight through legs, but overall max A +2 to total A +2 for sit<>stand x2 to move up the bed for better positioning      Balance Overall balance assessment: Needs assistance Sitting-balance support: Feet supported, Bilateral upper extremity supported Sitting balance-Leahy Scale: Poor Sitting balance - Comments: sitting  EOB able to maintain sitting with mod A, assist on forehead to hold up head Postural control: Posterior lean Standing  balance support: No upper extremity supported Standing balance-Leahy Scale: Zero Standing balance comment: dependent on therapists                           ADL either performed or assessed with clinical judgement   ADL Overall ADL's : Needs assistance/impaired                                       General ADL Comments: total A at this time     Vision Ability to See in Adequate Light: 2 Moderately impaired Patient Visual Report: Other (comment) (unable to self-report, eyes "roam" and do not seem to focus, will seek out sources of sound - but does not sustain contact) Vision Assessment?: Vision impaired- to be further tested in functional context Additional Comments: non focal gaze, "roams" has a R gaze preference     Perception     Praxis      Pertinent Vitals/Pain Pain Assessment Pain Assessment: Faces Faces Pain Scale: No hurt Pain Intervention(s): Monitored during session, Repositioned     Hand Dominance Right (according to previous information in the chart)   Extremity/Trunk Assessment Upper Extremity Assessment Upper Extremity Assessment: RUE deficits/detail;LUE deficits/detail RUE Deficits / Details: slight shoulder shrug at the end of session, but otherwise no movement noted - PROM WFL LUE Deficits / Details: shoulder shrug spontaneously, did not witness elbow flexion today - PROM WFL, gross spontaneous movement without purpose throughout session. would not move to remove washcloth from face   Lower Extremity Assessment Lower Extremity Assessment: Defer to PT evaluation   Cervical / Trunk Assessment Cervical / Trunk Assessment: Normal Cervical / Trunk Exceptions: weak trunk, weak cervical spine, head tends to slump forward without support   Communication Communication Communication: Receptive difficulties;Expressive difficulties (did not vocalize during session)   Cognition Arousal/Alertness: Awake/alert Behavior During Therapy:  Restless Overall Cognitive Status: Impaired/Different from baseline Area of Impairment: Following commands, Problem solving                       Following Commands:  (does not follow commands, maybe once with about a min delay)     Problem Solving: Slow processing, Requires verbal cues, Requires tactile cues, Decreased initiation General Comments: no verbalization throughout session despite multimodal cues and strategic attempts, potentially one instance of following command after 1 min delay. Pt laughing randomly during session x3 but without obvious trigger for laughter. Eyes "roam" and seem to look through/past therapists and objects - no focus and R preference - but will move to left with strong cues     General Comments  VSS throughout session on RA    Exercises Exercises: Other exercises Other Exercises Other Exercises: PROM of Bil UE and hands to assess that there are no contractures   Shoulder Instructions      Home Living Family/patient expects to be discharged to:: Unsure                                 Additional Comments: Pt unable to provide PLOF and no family present.  Suspect pt independent at baseline.      Prior Functioning/Environment Prior Level of Function :  Patient poor historian/Family not available                        OT Problem List: Decreased activity tolerance;Impaired balance (sitting and/or standing);Impaired vision/perception;Decreased coordination;Decreased cognition;Decreased safety awareness;Impaired UE functional use      OT Treatment/Interventions: Self-care/ADL training;Therapeutic exercise;Neuromuscular education;DME and/or AE instruction;Manual therapy;Therapeutic activities;Cognitive remediation/compensation;Visual/perceptual remediation/compensation;Patient/family education;Balance training    OT Goals(Current goals can be found in the care plan section) Acute Rehab OT Goals Patient Stated Goal: none  stated OT Goal Formulation: Patient unable to participate in goal setting Time For Goal Achievement: 11/11/21 Potential to Achieve Goals: Fair ADL Goals Pt Will Perform Grooming: with mod assist;sitting Pt Will Perform Upper Body Bathing: with mod assist;sitting Pt Will Transfer to Toilet: with max assist;stand pivot transfer;bedside commode Pt Will Perform Toileting - Clothing Manipulation and hygiene: with max assist;sit to/from stand Additional ADL Goal #1: Pt will sit EOB at min guard level for 5 min inorder to build up activity tolerance for ADL participation Additional ADL Goal #2: Pt will perform bed mobility as precursor to ADL at max A.  OT Frequency: Min 2X/week    Co-evaluation PT/OT/SLP Co-Evaluation/Treatment: Yes Reason for Co-Treatment: Necessary to address cognition/behavior during functional activity;For patient/therapist safety;To address functional/ADL transfers PT goals addressed during session: Mobility/safety with mobility;Balance;Strengthening/ROM OT goals addressed during session: ADL's and self-care;Strengthening/ROM;Other (comment) (cognition/vision)      AM-PAC OT "6 Clicks" Daily Activity     Outcome Measure Help from another person eating meals?: Total Help from another person taking care of personal grooming?: Total Help from another person toileting, which includes using toliet, bedpan, or urinal?: Total Help from another person bathing (including washing, rinsing, drying)?: Total Help from another person to put on and taking off regular upper body clothing?: Total Help from another person to put on and taking off regular lower body clothing?: Total 6 Click Score: 6   End of Session Equipment Utilized During Treatment: Gait belt Nurse Communication: Mobility status;Other (comment) (IV infusion complete)  Activity Tolerance: Patient tolerated treatment well Patient left: in bed;with call bell/phone within reach;with bed alarm set;Other (comment) (bed in  chair position)  OT Visit Diagnosis: Other symptoms and signs involving cognitive function;Muscle weakness (generalized) (M62.81);Other abnormalities of gait and mobility (R26.89)                Time: 4854-6270 OT Time Calculation (min): 25 min Charges:  OT General Charges $OT Visit: 1 Visit OT Evaluation $OT Eval Moderate Complexity: 1 Mod  Nyoka Cowden OTR/L Acute Rehabilitation Services Office: [phone]  Emelda Fear 10/28/2021, 1:45 PM

## 2021-10-28 NOTE — TOC Benefit Eligibility Note (Signed)
Patient Research scientist (life sciences) completed.     The patient is currently admitted and upon discharge could be taking Eliquis 5mg  tablet.   The current 30 day co-pay is, $0.00.   The patient is insured through [employer].

## 2021-10-28 NOTE — Progress Notes (Signed)
Physical Therapy Treatment Patient Details Name: [name] MRN: [id number] DOB: [birth year] Today's Date: 10/28/2021   History of Present Illness Pt is 56 yo male admitted on to [facility] in [place] on 9/6 after being found unresponsive.  Pt transferred to [facility] on 10/21/21.  Pt with acute toxic metabolic encephalopathy vs encephalitis in pt with acute ischemic strokes per neurology.  9/11 R UE DVT started on lovenox, 9/14-9/16 chest tube, ETT 9/6-9/18.  Hx of polysubstance abuse, hep C, and GERD.    PT Comments    Followed up with pt today with +2 assist.  Pt was able to get to EOB and stand but with max-total A x 2.  At EOB needing mod A for balance.  Still not following commands. Did add standing and OOB goals.     Recommendations for follow up therapy are one component of a multi-disciplinary discharge planning process, led by the attending physician.  Recommendations may be updated based on patient status, additional functional criteria and insurance authorization.  Follow Up Recommendations  Skilled nursing-short term rehab (<3 hours/day) Can patient physically be transported by private vehicle: No   Assistance Recommended at Discharge Frequent or constant Supervision/Assistance  Patient can return home with the following Two people to help with walking and/or transfers;Two people to help with bathing/dressing/bathroom;Assistance with cooking/housework;Assistance with feeding;Help with stairs or ramp for entrance   Equipment Recommendations  Other (comment) (TBD)    Recommendations for Other Services       Precautions / Restrictions Precautions Precautions: Fall Restrictions Weight Bearing Restrictions: No     Mobility  Bed Mobility Overal bed mobility: Needs Assistance Bed Mobility: Supine to Sit, Sit to Supine     Supine to sit: Total assist, +2 for safety/equipment, +2 for physical assistance Sit to supine: Total assist, +2 for physical assistance, +2 for  safety/equipment   General bed mobility comments: total A +2 for all aspects of bed mobility    Transfers Overall transfer level: Needs assistance Equipment used: 2 person hand held assist Transfers: Sit to/from Stand Sit to Stand: Max assist, +2 physical assistance, +2 safety/equipment, From elevated surface, Total assist           General transfer comment: Ptr able to take some weight through legs, but overall max A +2 to total A +2 for sit<>stand x2 to move up the bed for better positioning    Ambulation/Gait                   Stairs             Wheelchair Mobility    Modified Rankin (Stroke Patients Only) Modified Rankin (Stroke Patients Only) Pre-Morbid Rankin Score: No symptoms Modified Rankin: Severe disability     Balance   Sitting-balance support: Feet supported, Bilateral upper extremity supported Sitting balance-Leahy Scale: Poor Sitting balance - Comments: sitting EOB able to maintain sitting with mod A, assist on forehead to hold up head; EOB >10  mins     Standing balance-Leahy Scale: Zero Standing balance comment: dependent on therapists                            Cognition Arousal/Alertness: Awake/alert Behavior During Therapy: Restless Overall Cognitive Status: Impaired/Different from baseline Area of Impairment: Following commands, Problem solving                       Following Commands:  (Does not follow commands;  maybe once with min delay)     Problem Solving: Slow processing, Requires verbal cues, Requires tactile cues, Decreased initiation, Difficulty sequencing General Comments: no verbalization throughout session despite multimodal cues and strategic attempts, potentially one instance of following command after 1 min delay. Pt laughing randomly during session x3 but without obvious trigger for laughter. Eyes "roam" and seem to look through/past therapists and objects - no focus and R preference - but will  move to left with strong cues        Exercises Other Exercises Other Exercises: Attempted LE LAQ at EOB with max cues, tapping, facilitation but not following commands    General Comments General comments (skin integrity, edema, etc.): VSS      Pertinent Vitals/Pain Pain Assessment Pain Assessment: No/denies pain    Home Living Family/patient expects to be discharged to:: Unsure                   Additional Comments: Pt unable to provide PLOF and no family present.  Suspect pt independent at baseline.    Prior Function            PT Goals (current goals can now be found in the care plan section) Progress towards PT goals: Progressing toward goals    Frequency    Min 2X/week      PT Plan Current plan remains appropriate    Co-evaluation PT/OT/SLP Co-Evaluation/Treatment: Yes Reason for Co-Treatment: Necessary to address cognition/behavior during functional activity;For patient/therapist safety PT goals addressed during session: Mobility/safety with mobility OT goals addressed during session: ADL's and self-care;Strengthening/ROM;Other (comment) (cognition/vision)      AM-PAC PT "6 Clicks" Mobility   Outcome Measure  Help needed turning from your back to your side while in a flat bed without using bedrails?: Total Help needed moving from lying on your back to sitting on the side of a flat bed without using bedrails?: Total Help needed moving to and from a bed to a chair (including a wheelchair)?: Total Help needed standing up from a chair using your arms (e.g., wheelchair or bedside chair)?: Total Help needed to walk in hospital room?: Total Help needed climbing 3-5 steps with a railing? : Total 6 Click Score: 6    End of Session Equipment Utilized During Treatment: Gait belt Activity Tolerance: Other (comment) (limited by cognition) Patient left: in bed;with call bell/phone within reach;with bed alarm set Nurse Communication: Mobility status PT  Visit Diagnosis: Other abnormalities of gait and mobility (R26.89);Muscle weakness (generalized) (M62.81)     Time: [id number] PT Time Calculation (min) (ACUTE ONLY): 26 min  Charges:  $Therapeutic Activity: 8-22 mins                     Abran Richard, PT Acute Rehab [facility] [phone]    Karlton Lemon 10/28/2021, 1:49 PM

## 2021-10-28 NOTE — Progress Notes (Signed)
NAME:  [name] Detweiler, MRN:  [id number], DOB:  03/14/1965, LOS: 7 ADMISSION DATE:  10/21/2021, CONSULTATION DATE:  9/13 REFERRING MD:  Riki RuskAkolbire, CHIEF COMPLAINT:  anoxic injury   History of Present Illness:  56 year old male with past medical history significant for polysubstance abuse, hepatitis C, and GERD who presented to [facility] [place] on 9/6 after being found down.  He was last known well at approximately 1300 hrs. that day, however, EMS was called for wellness check at some time later on.  He was found to be down and unresponsive.  Upon EMS evaluation the patient was apneic with a thready pulse.  He had no response to intranasal Narcan.  He was bagged in route to the emergency department at which he arrived around 2300 hrs.  He was intubated immediately upon arrival to the ED.  CT of the head demonstrated cerebral edema without loss of white matter differentiation.  Repeat CT on 9/7 was read as normal by radiology, however, the neurologist still remained concerned for anoxic injury and recommended transfer to tertiary center for MRI, as the sending facility does not have ventilators or IV pumps compatible with MRI.  The patient was unable to transfer due to lack of available ICU beds.  The patient had been unable to wean from sedation due to significant agitation.  He would have nonpurposeful movement in the left upper and bilateral lower extremities.  No movement has been seen in his right upper extremity since presentation.  On 9/11 he was noted to have upper extremity edema on the right.  Ultrasound demonstrated acute thrombus in the mid and distal basilic vein.  Course also complicated by aspiration pneumonia for which he has completed a course of antibiotics.    On 9/13 he was transferred to [facility].   Pertinent  Medical History  GERD Head injury Hepatitis C Hiatal hernia Cocaine use (per daughter's report)  Significant Hospital Events: Including procedures,  antibiotic start and stop dates in addition to other pertinent events   9/6 found down > present to [facility] ED. CT with bilateral cerebellar edema. 9/7 repeat CT normal Unable to be weaned from sedation 9/11 RUE DVT started on tx dose lovenox 9/13 transfer to [facility] for [facility] 9/14 chest tube placed with intrapleural fibrinolytic administration 9/16 chest tube removed 9/17 patient extubated, remained agitated and started on precedex 9/19 Cortrak placed   Interim History / Subjective:  Patient awake, not able to follow commands at this time. No acute events overnight  Objective   Blood pressure (!) 157/96, pulse 88, temperature 98.3 F (36.8 C), temperature source Axillary, resp. rate 14, height 5\' 10"  (1.778 m), weight 72.2 kg, SpO2 96 %.        Intake/Output Summary (Last 24 hours) at 10/28/2021 0653 Last data filed at 10/28/2021 0500 Gross per 24 hour  Intake 2130.62 ml  Output 1200 ml  Net 930.62 ml   Filed Weights   10/26/21 0500 10/27/21 0500 10/28/21 0500  Weight: 72.7 kg 72.5 kg 72.2 kg    Examination: General: Chronically ill appearing male. NAD, but restless HENT: [place]/AT, eyes anicteric. Lungs: Coarse breath sounds, normal work of breathing Cardiovascular: RRR, no murmurs Abdomen: Soft, nontender, nondistended Extremities: Warm, dry. Neuro: Awake, confused. Moving extremities, but unable to follow commands.   Resolved Hospital Problem list     Assessment & Plan:  Acute toxic/metabolic encephalopathy in the setting of ischemic strokes MRI with evidence of multiple cerebral infarcts and cerebellar vasogenic edema. Has been  off precedex >24 hrs.  - Appreciate neurology input - Continue thiamine taper - Melatonin qhs  - Supportive care - Avoid deliriogenic medications   Multifocal cerebral infarcts  Etiology likely vasospastic infarcts in the setting of cocaine use. Unclear significance of cerebellar vasogenic edema.  - Supportive care - Appreciate neurology  input - Will need to start ASA once off anticoagulation    Right sided aspiration pneumonia with parapneumonic effusion  S/p chest tube placement with intrapleural lytics on 9/14, chest tube removed 9/16.  - Continue Zosyn through 9/20 to complete 7 day course   Acute hypoxemic respiratory failure secondary to aspiration pneumonia Patient extubated on 9/18, satting well on nasal cannula. Respirations are unlabored. - Maintain SpO2 >92%   Superficial vein thrombosis Diagnosed 9/11 with R basilic vein thrombus  - Continue therapeutic Lovenox   Thrombocytosis Platelets stable at 725. - Daily CBC - Will need ASA therapy once off anticoagulation    Protein calorie malnutrition SLP evaluated patient on 9/18, patient remains a high risk for aspiration events. Cortrak placed on 9/19 - Tube feeds  Mild transaminitis  Hepatitis C AST 65, ALT 94. Ammonia 48. - Lactulose daily    Best Practice (right click and "Reselect all SmartList Selections" daily)   Diet/type: tubefeeds DVT prophylaxis: systemic dose LMWH GI prophylaxis: N/A Lines: N/A Foley:  N/A Code Status:  full code Last date of multidisciplinary goals of care discussion [will update family today]  Dispo: Stable for transfer out of ICU  Labs   CBC: Recent Labs  Lab 10/23/21 0619 10/24/21 0651 10/25/21 0339 10/26/21 0644 10/27/21 0656  WBC 17.4* 19.6* 18.3* 16.5* 13.6*  HGB 12.3* 12.2* 12.2* 12.3* 13.0  HCT 37.3* 36.7* 36.9* 37.7* 41.1  MCV 96.6 97.6 97.6 97.7 101.5*  PLT 583* 653* 729* 736* 725*    Basic Metabolic Panel: Recent Labs  Lab 10/23/21 0619 10/24/21 0651 10/25/21 0339 10/26/21 0644 10/27/21 0656  NA 143 143 142 144 143  K 4.0 4.2 4.0 3.8 4.2  CL 107 108 109 110 109  CO2 24 21* 22 23 25   GLUCOSE 130* 117* 114* 133* 89  BUN 34* 37* 40* 39* 39*  CREATININE 0.87 0.87 0.88 0.81 0.87  CALCIUM 8.8* 8.8* 8.9 9.3 9.4  MG 2.5* 2.5* 2.6* 2.4 2.5*  PHOS 3.5 4.0 3.9 3.0 3.9   GFR: Estimated  Creatinine Clearance: 95.7 mL/min (by C-G formula based on SCr of 0.87 mg/dL). Recent Labs  Lab 10/22/21 0049 10/23/21 0619 10/24/21 0651 10/25/21 0339 10/26/21 0644 10/27/21 0656  PROCALCITON 0.58  --   --   --   --   --   WBC 20.5*   < > 19.6* 18.3* 16.5* 13.6*   < > = values in this interval not displayed.    Liver Function Tests: Recent Labs  Lab 10/22/21 0049 10/24/21 1807  AST 65* 46*  ALT 94* 86*  ALKPHOS 186* 166*  BILITOT 0.8 0.6  PROT 7.1 7.2  ALBUMIN 2.0* 2.2*   No results for input(s): "LIPASE", "AMYLASE" in the last 168 hours. Recent Labs  Lab 10/22/21 1151 10/24/21 1807  AMMONIA 48* 32    ABG    Component Value Date/Time   PHART 7.444 10/21/2021 2335   PCO2ART 38.3 10/21/2021 2335   PO2ART 85 10/21/2021 2335   HCO3 26.1 10/21/2021 2335   TCO2 27 10/21/2021 2335   O2SAT 96 10/21/2021 2335     Coagulation Profile: Recent Labs  Lab 10/22/21 0049  INR 1.0  Cardiac Enzymes: No results for input(s): "CKTOTAL", "CKMB", "CKMBINDEX", "TROPONINI" in the last 168 hours.  HbA1C: Hgb A1c MFr Bld  Date/Time Value Ref Range Status  10/22/2021 08:02 PM 5.5 4.8 - 5.6 % Final    Comment:    (NOTE) Pre diabetes:          5.7%-6.4%  Diabetes:              >6.4%  Glycemic control for   <7.0% adults with diabetes   02/14/2019 09:11 AM 5.5 4.8 - 5.6 % Final    Comment:             Prediabetes: 5.7 - 6.4          Diabetes: >6.4          Glycemic control for adults with diabetes: <7.0     CBG: Recent Labs  Lab 10/27/21 1114 10/27/21 1655 10/27/21 1924 10/27/21 2330 10/28/21 0323  GLUCAP 103* 119* 135* 153* 107*    Review of Systems:   Unable to obtain  Past Medical History:  He,  has a past medical history of Acid reflux, Head injury, Hepatitis C, and Hiatal hernia.   Surgical History:   Past Surgical History:  Procedure Laterality Date   ACROMIO-CLAVICULAR JOINT REPAIR Right 06/28/2018   Procedure: ACROMIO-CLAVICULAR JOINT  IRRIGATION AND DEBRIDEMENT;  Surgeon: [name], MD;  Location: [place];  Service: Orthopedics;  Laterality: Right;   FACIAL FRACTURE SURGERY     IRRIGATION AND DEBRIDEMENT SHOULDER Right 06/28/2018   Procedure: IRRIGATION AND DEBRIDEMENT SHOULDER;  Surgeon: [name], MD;  Location: [place];  Service: Orthopedics;  Laterality: Right;   KNEE ARTHROSCOPY Left 06/23/2018   Procedure: LEFT KNEE ARTHROSCOPY KNEE I&D.;  Surgeon: [name], MD;  Location: [place];  Service: Orthopedics;  Laterality: Left;     Social History:   reports that he has been smoking cigarettes. He has a 20.00 pack-year smoking history. He has never used smokeless tobacco. He reports current alcohol use. He reports that he does not currently use drugs after having used the following drugs: Cocaine.   Family History:  His family history includes Diabetes in his mother; Hypertension in his father and mother.   Allergies No Known Allergies   Home Medications  Prior to Admission medications   Medication Sig Start Date End Date Taking? Authorizing Provider  acetaminophen (TYLENOL) 160 MG/5ML elixir Place 15 mg/kg into feeding tube every 6 (six) hours as needed for fever or pain.   Yes [provider]  Alcohol, USP (ETHYL ALCOHOL) 95 % SOLN Place 1 Swab into the nose 2 (two) times daily. Swab each nostril twice daily   Yes [provider]  chlordiazePOXIDE (LIBRIUM) 25 MG capsule Place 25 mg into feeding tube 3 (three) times daily.   Yes [provider]  chlorhexidine (PERIDEX) 0.12 % solution Use as directed 15 mLs in the mouth or throat 2 (two) times daily.   Yes [provider]  DEXMEDETOMIDINE HCL IV Inject 100 mLs into the vein See admin instructions. MAR from [facility] does not include concentration: Dexmedetomidin 170ml every 6 hours and 54 minutes, IV drip   Yes [provider]  enoxaparin (LOVENOX) 80 MG/0.8ML injection Inject 80 mg into the skin  daily.   Yes [provider]  folic acid (FOLVITE) 1 MG tablet Place 1 mg into feeding tube daily.   Yes [provider]  Multiple Vitamin (MULTIVITAMIN) tablet Place 1 tablet into feeding tube  daily.   Yes [provider]  OLANZapine (ZYPREXA) 5 MG tablet Place 5 mg into feeding tube 2 (two) times daily.   Yes [provider]  pantoprazole (PROTONIX) 40 MG injection Inject 40 mg into the vein daily.   Yes [provider]  propofol (DIPRIVAN) 1000 MG/100ML EMUL injection Inject 1,000 mg into the vein See admin instructions. Propofol 1000 mg / 100 ml at 10.031 ml/hr over 9 hours 59 minutes   Yes [provider]  scopolamine (TRANSDERM-SCOP) 1 MG/3DAYS Place 1 patch onto the skin every 3 (three) days.   Yes [provider]  Sofosbuvir-Velpatasvir (EPCLUSA) 400-100 MG TABS Take 1 tablet by mouth daily. 10/11/18  Yes Cliffton Asters, MD  thiamine (VITAMIN B1) 100 MG tablet Place 100 mg into feeding tube daily.   Yes [provider]     Critical care time: 24 mins

## 2021-10-29 DIAGNOSIS — G9340 Encephalopathy, unspecified: Secondary | ICD-10-CM | POA: Diagnosis not present

## 2021-10-29 LAB — CBC
HCT: 42 % (ref 39.0–52.0)
Hemoglobin: 14.2 g/dL (ref 13.0–17.0)
MCH: 32.7 pg (ref 26.0–34.0)
MCHC: 33.8 g/dL (ref 30.0–36.0)
MCV: 96.8 fL (ref 80.0–100.0)
Platelets: 706 10*3/uL — ABNORMAL HIGH (ref 150–400)
RBC: 4.34 MIL/uL (ref 4.22–5.81)
RDW: 13.1 % (ref 11.5–15.5)
WBC: 11.3 10*3/uL — ABNORMAL HIGH (ref 4.0–10.5)
nRBC: 0 % (ref 0.0–0.2)

## 2021-10-29 LAB — BASIC METABOLIC PANEL
Anion gap: 7 (ref 5–15)
BUN: 31 mg/dL — ABNORMAL HIGH (ref 6–20)
CO2: 26 mmol/L (ref 22–32)
Calcium: 9.5 mg/dL (ref 8.9–10.3)
Chloride: 105 mmol/L (ref 98–111)
Creatinine, Ser: 0.81 mg/dL (ref 0.61–1.24)
GFR, Estimated: >60 mL/min (ref >60)
Glucose, Bld: 124 mg/dL — ABNORMAL HIGH (ref 70–99)
Potassium: 4 mmol/L (ref 3.5–5.1)
Sodium: 138 mmol/L (ref 135–145)

## 2021-10-29 LAB — GLUCOSE, CAPILLARY
Glucose-Capillary: 116 mg/dL — ABNORMAL HIGH (ref 70–99)
Glucose-Capillary: 117 mg/dL — ABNORMAL HIGH (ref 70–99)
Glucose-Capillary: 113 mg/dL — ABNORMAL HIGH (ref 70–99)
Glucose-Capillary: 124 mg/dL — ABNORMAL HIGH (ref 70–99)
Glucose-Capillary: 130 mg/dL — ABNORMAL HIGH (ref 70–99)

## 2021-10-29 NOTE — NC FL2 (Signed)
[place] MEDICAID FL2 LEVEL OF CARE SCREENING TOOL     IDENTIFICATION  Patient Name: [name] Birthdate: 25-Feb-1965 Sex: male Admission Date (Current Location): 10/21/2021  [facility] and [place] Number:  [employer] and Address:  [facility], [address], [place], [place] [address]      Provider Number: [id number]  Attending Physician Name and Address:  Alba Cory, MD  Relative Name and Phone Number:       Current Level of Care: Hospital Recommended Level of Care: Skilled Nursing Facility Prior Approval Number:    Date Approved/Denied:   PASRR Number: [id number]  Discharge Plan: SNF    Current Diagnoses: Patient Active Problem List   Diagnosis Date Noted   Acute respiratory failure with hypoxia [facility])    Cerebrovascular accident (CVA) (HCC)    Acute encephalopathy 10/22/2021   Fever    Pleural effusion    Deep venous thrombosis (HCC)    Pain and swelling of left lower leg 12/28/2018   Septic arthritis of shoulder, right (HCC) 09/07/2018   Septic arthritis of sacroiliac joint (HCC) 09/07/2018   Sternoclavicular joint pain, left 09/07/2018   Discitis of lumbosacral region 06/28/2018   Psoas abscess, right (HCC) 06/28/2018   Polysubstance abuse (HCC) 06/24/2018   Normocytic anemia 06/24/2018   Recurrent boils 06/24/2018   Head injury    Septic arthritis of knee, left (HCC)    Acute medial meniscal tear, left, initial encounter    GERD (gastroesophageal reflux disease) 06/22/2018   Mild protein-calorie malnutrition (HCC) 06/22/2018   AKI (acute kidney injury) (HCC) 06/21/2018   Spinal stenosis of lumbosacral region 06/21/2018   Chronic hepatitis C (HCC) 12/17/2013   Cigarette smoker 12/17/2013   Family history of diabetes mellitus (DM) 12/17/2013   Notalgia 12/17/2013    Orientation RESPIRATION BLADDER Height & Weight      (disoriented)  Normal Incontinent Weight: 158 lb 8.2 oz (71.9 kg) Height:  5\' 10"   (177.8 cm)  BEHAVIORAL SYMPTOMS/MOOD NEUROLOGICAL BOWEL NUTRITION STATUS      Incontinent Feeding tube (Osmolite 1.5)  AMBULATORY STATUS COMMUNICATION OF NEEDS Skin   Extensive Assist Non-Verbally Normal                       Personal Care Assistance Level of Assistance  Bathing, Feeding, Dressing Bathing Assistance: Maximum assistance Feeding assistance: Maximum assistance Dressing Assistance: Maximum assistance     Functional Limitations Info  Speech     Speech Info: Impaired (expressive aphasia)    SPECIAL CARE FACTORS FREQUENCY  PT (By licensed PT), OT (By licensed OT), Speech therapy     PT Frequency: 5x/wk OT Frequency: 5x/wk     Speech Therapy Frequency: 5x/wk      Contractures Contractures Info: Not present    Additional Factors Info  Code Status, Allergies Code Status Info: Full Allergies Info: NKA           Current Medications (10/29/2021):  This is the current hospital active medication list Current Facility-Administered Medications  Medication Dose Route Frequency Provider Last Rate Last Admin   acetaminophen (TYLENOL) tablet 650 mg  650 mg Per Tube Q6H PRN 10/31/2021, MD   650 mg at 10/22/21 2102   Chlorhexidine Gluconate Cloth 2 % PADS 6 each  6 each Topical Daily Hunsucker, 2103, MD   6 each at 10/28/21 1005   docusate sodium (COLACE) capsule 100 mg  100 mg Oral BID PRN 10/30/21, NP  enoxaparin (LOVENOX) injection 70 mg  70 mg Subcutaneous Q12H Jacky Kindle, MD   70 mg at 10/29/21 0554   feeding supplement (OSMOLITE 1.5 CAL) liquid 1,000 mL  1,000 mL Per Tube Continuous Jacky Kindle, MD 30 mL/hr at 10/28/21 1600 Infusion Verify at 10/28/21 1600   feeding supplement (PROSource TF20) liquid 60 mL  60 mL Per Tube Daily Jacky Kindle, MD   60 mL at 10/29/21 1049   free water 200 mL  200 mL Per Tube Q4H Chand, Currie Paris, MD   200 mL at 10/29/21 1103   melatonin tablet 5 mg  5 mg Per Tube QHS Jacky Kindle, MD   5 mg at 10/28/21 2153    Oral care mouth rinse  15 mL Mouth Rinse 4 times per day Candee Furbish, MD   15 mL at 10/29/21 1103   Oral care mouth rinse  15 mL Mouth Rinse PRN Candee Furbish, MD       oxyCODONE (Oxy IR/ROXICODONE) immediate release tablet 10 mg  10 mg Per Tube Q4H PRN Atway, Rayann N, DO   10 mg at 10/28/21 1334   pantoprazole (PROTONIX) 2 mg/mL oral suspension 40 mg  40 mg Per Tube Daily Candee Furbish, MD   40 mg at 10/29/21 1049   polyethylene glycol (MIRALAX / GLYCOLAX) packet 17 g  17 g Oral Daily PRN Corey Harold, NP       thiamine (VITAMIN B1) 250 mg in sodium chloride 0.9 % 50 mL IVPB  250 mg Intravenous Daily Donnetta Simpers, MD 100 mL/hr at 10/29/21 1058 250 mg at 10/29/21 1058   Followed by   Derrill Memo ON 10/31/2021] thiamine (VITAMIN B1) injection 100 mg  100 mg Intravenous Daily Donnetta Simpers, MD         Discharge Medications: Please see discharge summary for a list of discharge medications.  Relevant Imaging Results:  Relevant Lab Results:   Additional Information SS#: [id number]  Geralynn Ochs, LCSW

## 2021-10-29 NOTE — Progress Notes (Signed)
Called up [place] updated pt condition, about vital sign, feedings and still with diarrhea, pt can tell inappropriate  word but cannot answer question when being asked, Ms Ashely apologized about the father behavior, and thankful about the care that was given to his father and said will visit the father soon

## 2021-10-29 NOTE — Plan of Care (Signed)

## 2021-10-29 NOTE — Progress Notes (Signed)
PROGRESS NOTE    [name]  AVW:[id number] DOB: 09/09/1965 DOA: 10/21/2021 PCP: Cain SaupeFulp, Cammie, MD   Brief Narrative: 56 year old with past medical history significant for polysubstance abuse, hepatitis C, who presented to [facility] in [place] on 9/6 after being found down.  He was last known well at approximately 13:00 hours that day however EMS was called for wellness check at some time later on.  He was found to be down and unresponsive.  On EMS evaluation the patient was apneic with thready pulse.  He did not responded to intranasal Narcan.  He was bagged in route to the emergency department and subsequently intubated on arrival to the ED.  CT head demonstrated cerebral edema without loss of white matter differentiation.  He was [facility].  On 9/11 he was noted to have upper extremity edema on the right.  Ultrasound demonstrated acute thrombosis in the mid and distal basilic vein.  Hospital course also complicated by aspiration pneumonia.  Subsequently transferred to [facility] on 9/13.  Events:  9/6 found down > present [facility] ED. CT with bilateral cerebellar edema. 9/7 repeat CT normal Unable to be weaned from sedation 9/11 RUE DVT started on tx dose lovenox 9/13 transfer to [facility] for [facility] 9/14 chest tube placed with intrapleural fibrinolytic administration 9/16 chest tube removed 9/17 patient extubated, remained agitated and started on precedex 9/19 Cortrak placed    Assessment & Plan:   Principal Problem:   Acute encephalopathy Active Problems:   Fever   Pleural effusion   Deep venous thrombosis (HCC)   Acute respiratory failure with hypoxia (HCC)   Cerebrovascular accident (CVA) (HCC)   1-Acute toxic metabolic encephalopathy in the setting of ischemic stroke: -On high-dose thiamine -In the setting of acute stroke. -LP performed, meningitis panel was negative. -UDS was positive for cocaine. -EEG-persistently showed no  epileptiform activity -Per neurology high suspicion for anoxic brain injury versus toxin induced encephalopathy and ischemic strokes. -Neurology recommend continue with high-dose thiamine, no immediate indication for addition of antiplatelets for secondary stroke prevention as patient is on therapeutic Lovenox.  When patient is off of anticoagulation aspirin should be started.  2-Multifocal cerebral infarcts: -Neurology recommended to start aspirin when patient is off of anticoagulation -Currently on Lovenox for treatment of vein thrombosis -See above.    3-Acute hypoxic respiratory failure secondary to aspiration pneumonia Right-sided aspiration pneumonia with parapneumonic effusion: -Status post chest tube placement with intrapleural lytics on 9/14, chest tube removed 9/16. -Completed 7 days of IV Zosyn -Intubated on admission outside hospital 9/6. -Extubated 9/17. -Currently on Room air.  -will need chest x ray in 24-48 hours follow up effusion.   Superficial vein thrombosis Diagnosed 9/11 with right basilic vein thrombus -Due to increase for progression of thrombosis patient was a started on Lovenox  Thrombocytosis: We need to add aspirin once off of anticoagulation. Monitor.   Inadequate Oral Intake, in setting stroke, acute illness.  Currently has core track.  IR consulted for PEg tube placement.   Mild transaminases, history of hepatitis C Mild elevated ammonia at 48---32. Received Lactulose  Diarrhea; Will ask nutritionist if formula can be change, check GI pathogen.   Nutrition Problem: Inadequate oral intake Etiology: inability to eat    Signs/Symptoms: NPO status    Interventions: Tube feeding  Estimated body mass index is 22.74 kg/m as calculated from the following:   Height as of this encounter: 5\' 10"  (1.778 m).   Weight as of this  encounter: 71.9 kg.   DVT prophylaxis: Lovenox Code Status: Full code Family Communication: Daughter  Disposition  Plan:  Status is: Inpatient Remains inpatient appropriate because: will need SNF    Consultants:  Neurology CCM  Procedures:  EEG   Antimicrobials:  IV zosyn   Subjective: He is alert, not following commands. He mumbles few words.  He has rectal tube, diarrhea.   Objective: Vitals:   10/28/21 1731 10/28/21 1930 10/28/21 2345 10/29/21 0426  BP: (!) 128/93 121/85 (!) 141/96   Pulse: 94 100 79   Resp: 20 20 18    Temp: 98.4 F (36.9 C) 98.2 F (36.8 C) 98.6 F (37 C)   TempSrc: Oral Axillary    SpO2: 97% 93% 98%   Weight:    71.9 kg  Height:        Intake/Output Summary (Last 24 hours) at 10/29/2021 0708 Last data filed at 10/29/2021 0600 Gross per 24 hour  Intake 1474.6 ml  Output 510 ml  Net 964.6 ml   Filed Weights   10/27/21 0500 10/28/21 0500 10/29/21 0426  Weight: 72.5 kg 72.2 kg 71.9 kg    Examination:  General exam: Appears calm and comfortable  Respiratory system: Clear to auscultation. Respiratory effort normal. Cardiovascular system: S1 & S2 heard, RRR.  Gastrointestinal system: Abdomen is nondistended, soft and nontender. No organomegaly or masses felt. Normal bowel sounds heard. Central nervous system: Alert, not following command, mumbles few words.  Extremities: no edema   Data Reviewed: I have personally reviewed following labs and imaging studies  CBC: Recent Labs  Lab 10/23/21 0619 10/24/21 0651 10/25/21 0339 10/26/21 0644 10/27/21 0656  WBC 17.4* 19.6* 18.3* 16.5* 13.6*  HGB 12.3* 12.2* 12.2* 12.3* 13.0  HCT 37.3* 36.7* 36.9* 37.7* 41.1  MCV 96.6 97.6 97.6 97.7 101.5*  PLT 583* 653* 729* 736* 725*   Basic Metabolic Panel: Recent Labs  Lab 10/23/21 0619 10/24/21 0651 10/25/21 0339 10/26/21 0644 10/27/21 0656  NA 143 143 142 144 143  K 4.0 4.2 4.0 3.8 4.2  CL 107 108 109 110 109  CO2 24 21* 22 23 25   GLUCOSE 130* 117* 114* 133* 89  BUN 34* 37* 40* 39* 39*  CREATININE 0.87 0.87 0.88 0.81 0.87  CALCIUM 8.8* 8.8* 8.9  9.3 9.4  MG 2.5* 2.5* 2.6* 2.4 2.5*  PHOS 3.5 4.0 3.9 3.0 3.9   GFR: Estimated Creatinine Clearance: 95.3 mL/min (by C-G formula based on SCr of 0.87 mg/dL). Liver Function Tests: Recent Labs  Lab 10/24/21 1807  AST 46*  ALT 86*  ALKPHOS 166*  BILITOT 0.6  PROT 7.2  ALBUMIN 2.2*   No results for input(s): "LIPASE", "AMYLASE" in the last 168 hours. Recent Labs  Lab 10/22/21 1151 10/24/21 1807  AMMONIA 48* 32   Coagulation Profile: No results for input(s): "INR", "PROTIME" in the last 168 hours. Cardiac Enzymes: No results for input(s): "CKTOTAL", "CKMB", "CKMBINDEX", "TROPONINI" in the last 168 hours. BNP (last 3 results) No results for input(s): "PROBNP" in the last 8760 hours. HbA1C: No results for input(s): "HGBA1C" in the last 72 hours. CBG: Recent Labs  Lab 10/28/21 1516 10/28/21 1735 10/28/21 1944 10/28/21 2343 10/29/21 0341  GLUCAP 108* 124* 119* 132* 116*   Lipid Profile: No results for input(s): "CHOL", "HDL", "LDLCALC", "TRIG", "CHOLHDL", "LDLDIRECT" in the last 72 hours. Thyroid Function Tests: No results for input(s): "TSH", "T4TOTAL", "FREET4", "T3FREE", "THYROIDAB" in the last 72 hours. Anemia Panel: No results for input(s): "VITAMINB12", "FOLATE", "FERRITIN", "TIBC", "IRON", "RETICCTPCT"  in the last 72 hours. Sepsis Labs: No results for input(s): "PROCALCITON", "LATICACIDVEN" in the last 168 hours.  Recent Results (from the past 240 hour(s))  MRSA Next Gen by PCR, Nasal     Status: None   Collection Time: 10/21/21 11:01 PM   Specimen: Nasal Mucosa; Nasal Swab  Result Value Ref Range Status   MRSA by PCR Next Gen NOT DETECTED NOT DETECTED Final    Comment: (NOTE) The GeneXpert MRSA Assay (FDA approved for NASAL specimens only), is one component of a comprehensive MRSA colonization surveillance program. It is not intended to diagnose MRSA infection nor to guide or monitor treatment for MRSA infections. Test performance is not FDA approved in  patients less than 16 years old. Performed at [facility], [address] [place], [place] [address]   Culture, blood (Routine X 2) w Reflex to ID Panel     Status: None   Collection Time: 10/22/21 12:44 AM   Specimen: BLOOD LEFT HAND  Result Value Ref Range Status   Specimen Description BLOOD LEFT HAND  Final   Special Requests   Final    BOTTLES DRAWN AEROBIC ONLY Blood Culture results may not be optimal due to an inadequate volume of blood received in culture bottles   Culture   Final    NO GROWTH 5 DAYS Performed at [facility], [address] [place], [place] [address]    Report Status 10/27/2021 FINAL  Final  Culture, blood (Routine X 2) w Reflex to ID Panel     Status: None   Collection Time: 10/22/21 12:51 AM   Specimen: BLOOD  Result Value Ref Range Status   Specimen Description BLOOD LEFT ANTECUBITAL  Final   Special Requests   Final    BOTTLES DRAWN AEROBIC AND ANAEROBIC Blood Culture adequate volume   Culture   Final    NO GROWTH 5 DAYS Performed at [facility], [address], [place], [place] [address]    Report Status 10/27/2021 FINAL  Final  Expectorated Sputum Assessment w Gram Stain, Rflx to Resp Cult     Status: None   Collection Time: 10/22/21 10:06 AM   Specimen: Expectorated Sputum  Result Value Ref Range Status   Specimen Description EXPECTORATED SPUTUM  Final   Special Requests NONE  Final   Sputum evaluation   Final    THIS SPECIMEN IS ACCEPTABLE FOR SPUTUM CULTURE Performed at [facility], [address] [place], [place] [address]    Report Status 10/26/2021 FINAL  Final  C Difficile Quick Screen w PCR reflex     Status: None   Collection Time: 10/22/21 10:06 AM   Specimen: STOOL  Result Value Ref Range Status   C Diff antigen NEGATIVE NEGATIVE Final   C Diff toxin NEGATIVE NEGATIVE Final   C Diff interpretation No C. difficile detected.  Final    Comment: Performed at [facility], [address]  [place], [place] [address]  Culture, Respiratory w Gram Stain     Status: None   Collection Time: 10/22/21 10:06 AM  Result Value Ref Range Status   Specimen Description EXPECTORATED SPUTUM  Final   Special Requests NONE Reflexed from [id number]  Final   Gram Stain   Final    RARE WBC PRESENT,BOTH PMN AND MONONUCLEAR FEW GRAM NEGATIVE RODS    Culture   Final    ABUNDANT PSEUDOMONAS AERUGINOSA NO STAPHYLOCOCCUS AUREUS ISOLATED Performed at [facility]  Lab, [address], [place], [place] [address]    Report Status 10/25/2021 FINAL  Final   Organism ID, Bacteria PSEUDOMONAS AERUGINOSA  Final      Susceptibility   Pseudomonas aeruginosa - MIC*    CEFTAZIDIME 4 SENSITIVE Sensitive     CIPROFLOXACIN 0.5 SENSITIVE Sensitive     GENTAMICIN <=1 SENSITIVE Sensitive     IMIPENEM 2 SENSITIVE Sensitive     PIP/TAZO <=4 SENSITIVE Sensitive     CEFEPIME 2 SENSITIVE Sensitive     * ABUNDANT PSEUDOMONAS AERUGINOSA  Body fluid culture w Gram Stain     Status: None   Collection Time: 10/22/21 11:42 AM   Specimen: Pleural Fluid  Result Value Ref Range Status   Specimen Description FLUID  Final   Special Requests NONE  Final   Gram Stain NO WBC SEEN NO ORGANISMS SEEN   Final   Culture   Final    NO GROWTH 3 DAYS Performed at [facility], [address], [place], [place] [address]    Report Status 10/25/2021 FINAL  Final  Fungus Culture With Stain     Status: None (Preliminary result)   Collection Time: 10/22/21 11:42 AM   Specimen: Pleural Fluid  Result Value Ref Range Status   Fungus Stain Final report  Final    Comment: (NOTE) Performed At: [facility] [address] [place], [place] [id number] Jolene Schimke MD WG:[id number]    Fungus (Mycology) Culture PENDING  Incomplete   Fungal Source FLUID  Final    Comment: Performed at [facility], [address], [place], [place] [address]  Fungus Culture Result     Status: None   Collection Time: 10/22/21 11:42 AM   Result Value Ref Range Status   Result 1 Comment  Final    Comment: (NOTE) KOH/Calcofluor preparation:  no fungus observed. Performed At: [facility] [address] [place], [place] [id number] Jolene Schimke MD KG:[id number]   CSF culture w Gram Stain     Status: None   Collection Time: 10/23/21 10:31 AM   Specimen: CSF; Cerebrospinal Fluid  Result Value Ref Range Status   Specimen Description CSF  Final   Special Requests NONE  Final   Gram Stain   Final    NO ORGANISMS SEEN RARE WBC PRESENT,BOTH PMN AND MONONUCLEAR    Culture   Final    NO GROWTH 3 DAYS Performed at [facility], [address] [place], [place] [address]    Report Status 10/26/2021 FINAL  Final  Culture, blood (Routine X 2)     Status: None   Collection Time: 10/23/21 11:42 AM   Specimen: BLOOD LEFT ARM  Result Value Ref Range Status   Specimen Description BLOOD LEFT ARM  Final   Special Requests   Final    BOTTLES DRAWN AEROBIC AND ANAEROBIC Blood Culture adequate volume   Culture   Final    NO GROWTH 5 DAYS Performed at [facility], [address], [place], [place] [address]    Report Status 10/28/2021 FINAL  Final  Culture, blood (Routine X 2)     Status: None   Collection Time: 10/23/21 11:47 AM   Specimen: BLOOD LEFT HAND  Result Value Ref Range Status   Specimen Description BLOOD LEFT HAND  Final   Special Requests   Final    BOTTLES DRAWN AEROBIC AND ANAEROBIC Blood Culture adequate volume   Culture   Final    NO GROWTH  5 DAYS Performed at [facility], [address], [place], [place] [address]    Report Status 10/28/2021 FINAL  Final         Radiology Studies: CT ABDOMEN WO CONTRAST  Result Date: 10/28/2021 CLINICAL DATA:  Evaluate location of enteric tube EXAM: CT ABDOMEN WITHOUT CONTRAST TECHNIQUE: Multidetector CT imaging of the abdomen was performed following the standard protocol without IV contrast. RADIATION DOSE REDUCTION: This exam was  performed according to the departmental dose-optimization program which includes automated exposure control, adjustment of the mA and/or kV according to patient size and/or use of iterative reconstruction technique. COMPARISON:  Previous studies including the examination of 10/27/2021 FINDINGS: Lower chest: There is moderate to large infiltrate in the posterior right lower lung fields. Small linear patchy infiltrates are seen in left lower lung field. Small right pleural effusion is seen. There is minimal left pleural effusion. Hepatobiliary: No focal abnormalities are seen in liver. Gallbladder is unremarkable. There is no dilation of bile ducts. Pancreas: No focal abnormalities are seen. Spleen: Unremarkable. Adrenals/Urinary Tract: Adrenals are not enlarged. There is no hydronephrosis. There are no renal stones. Stomach/Bowel: Stomach is not distended. Tip of enteric tube is seen in the region of distal antrum/pylorus. Visualized small bowel loops are unremarkable. Visualized portions of colon show scattered diverticula. Vascular/Lymphatic: Scattered arterial calcifications are seen. Other: There is no ascites or pneumoperitoneum in upper abdomen. Musculoskeletal: There is first-degree spondylolisthesis at L5-S1 level. Spondylolysis is seen in L5 vertebra. Degenerative changes are noted with disc space narrowing, bony spurs and encroachment of neural foramina at L5-S1 level. IMPRESSION: Tip of enteric tube is seen in the region of distal antrum of stomach or pylorus. There is moderate to large infiltrate in right lower lung fields suggesting atelectasis/pneumonia. Possible subsegmental atelectasis in left lower lung field. Bilateral pleural effusions, more so on the right side. Scattered diverticula are seen in colon. Spondylolysis in the L5 vertebra with first-degree spondylolisthesis at L5-S1 level. Degenerative changes are noted with encroachment of neural foramina at L5-S1 level. Electronically Signed   By:  Ernie Avena M.D.   On: 10/28/2021 15:36   DG Abd Portable 1V  Result Date: 10/27/2021 CLINICAL DATA:  Feeding tube placement EXAM: PORTABLE ABDOMEN - 1 VIEW COMPARISON:  None Available. FINDINGS: Endotracheal tube overlies the midthoracic trachea. Orogastric tube passes below the diaphragm, tip and side port overlie the gastric fundus. Unchanged cardiomediastinal silhouette. There is a small right pleural effusion and adjacent right basilar opacities. Left lung is clear. No evidence of pneumothorax. Bones are unchanged. IMPRESSION: Stable small right pleural effusion with adjacent basilar atelectasis. No evidence of pneumothorax. Orogastric tube tip and side port overlie the gastric fundus. Electronically Signed   By: Caprice Renshaw M.D.   On: 10/27/2021 11:58        Scheduled Meds:  aspirin  81 mg Per Tube Daily   Chlorhexidine Gluconate Cloth  6 each Topical Daily   enoxaparin (LOVENOX) injection  70 mg Subcutaneous Q12H   feeding supplement (PROSource TF20)  60 mL Per Tube Daily   free water  200 mL Per Tube Q4H   melatonin  5 mg Per Tube QHS   mouth rinse  15 mL Mouth Rinse 4 times per day   pantoprazole  40 mg Per Tube Daily   [START ON 10/31/2021] thiamine (VITAMIN B1) injection  100 mg Intravenous Daily   Continuous Infusions:  feeding supplement (OSMOLITE 1.5 CAL) 30 mL/hr at 10/28/21 1600   thiamine (VITAMIN B1) injection  Stopped (10/28/21 1018)     LOS: 8 days    Time spent: 35 minutes.     Elmarie Shiley, MD [facility]   If 7PM-7AM, please contact night-coverage [email or web address]  10/29/2021, 7:08 AM

## 2021-10-30 ENCOUNTER — Inpatient Hospital Stay (HOSPITAL_COMMUNITY): Payer: Medicaid Other

## 2021-10-30 DIAGNOSIS — G9340 Encephalopathy, unspecified: Secondary | ICD-10-CM | POA: Diagnosis not present

## 2021-10-30 DIAGNOSIS — I82401 Acute embolism and thrombosis of unspecified deep veins of right lower extremity: Secondary | ICD-10-CM | POA: Diagnosis not present

## 2021-10-30 DIAGNOSIS — J90 Pleural effusion, not elsewhere classified: Secondary | ICD-10-CM | POA: Diagnosis not present

## 2021-10-30 DIAGNOSIS — Z515 Encounter for palliative care: Secondary | ICD-10-CM

## 2021-10-30 DIAGNOSIS — I63011 Cerebral infarction due to thrombosis of right vertebral artery: Secondary | ICD-10-CM | POA: Diagnosis not present

## 2021-10-30 DIAGNOSIS — J9601 Acute respiratory failure with hypoxia: Secondary | ICD-10-CM | POA: Diagnosis not present

## 2021-10-30 DIAGNOSIS — G931 Anoxic brain damage, not elsewhere classified: Secondary | ICD-10-CM | POA: Diagnosis not present

## 2021-10-30 LAB — GASTROINTESTINAL PANEL BY PCR, STOOL (REPLACES STOOL CULTURE)

## 2021-10-30 LAB — CBC
HCT: 41.4 % (ref 39.0–52.0)
Hemoglobin: 13.9 g/dL (ref 13.0–17.0)
MCH: 31.7 pg (ref 26.0–34.0)
MCHC: 33.6 g/dL (ref 30.0–36.0)
MCV: 94.3 fL (ref 80.0–100.0)
Platelets: 789 10*3/uL — ABNORMAL HIGH (ref 150–400)
RBC: 4.39 MIL/uL (ref 4.22–5.81)
RDW: 12.9 % (ref 11.5–15.5)
WBC: 11.1 10*3/uL — ABNORMAL HIGH (ref 4.0–10.5)
nRBC: 0 % (ref 0.0–0.2)

## 2021-10-30 LAB — GLUCOSE, CAPILLARY
Glucose-Capillary: 101 mg/dL — ABNORMAL HIGH (ref 70–99)
Glucose-Capillary: 124 mg/dL — ABNORMAL HIGH (ref 70–99)
Glucose-Capillary: 130 mg/dL — ABNORMAL HIGH (ref 70–99)
Glucose-Capillary: 133 mg/dL — ABNORMAL HIGH (ref 70–99)
Glucose-Capillary: 160 mg/dL — ABNORMAL HIGH (ref 70–99)

## 2021-10-30 LAB — COMPREHENSIVE METABOLIC PANEL
ALT: 57 U/L — ABNORMAL HIGH (ref 0–44)
AST: 33 U/L (ref 15–41)
Albumin: 2.8 g/dL — ABNORMAL LOW (ref 3.5–5.0)
Alkaline Phosphatase: 117 U/L (ref 38–126)
Anion gap: 10 (ref 5–15)
BUN: 27 mg/dL — ABNORMAL HIGH (ref 6–20)
CO2: 22 mmol/L (ref 22–32)
Calcium: 9.5 mg/dL (ref 8.9–10.3)
Chloride: 106 mmol/L (ref 98–111)
Creatinine, Ser: 0.71 mg/dL (ref 0.61–1.24)
GFR, Estimated: >60 mL/min (ref >60)
Glucose, Bld: 121 mg/dL — ABNORMAL HIGH (ref 70–99)
Potassium: 4 mmol/L (ref 3.5–5.1)
Sodium: 138 mmol/L (ref 135–145)
Total Bilirubin: 0.6 mg/dL (ref 0.3–1.2)
Total Protein: 7.4 g/dL (ref 6.5–8.1)

## 2021-10-30 MED ORDER — JEVITY 1.5 CAL/FIBER PO LIQD
1000.0000 mL | ORAL | Status: DC
  Administered 2021-10-30 – 2021-11-04 (×3): 1000 mL
  Filled 2021-10-30 (×12): qty 1000

## 2021-10-30 MED ORDER — BANATROL TF EN LIQD
60.0000 mL | Freq: Two times a day (BID) | ENTERAL | Status: DC
  Administered 2021-10-30 – 2021-11-11 (×22): 60 mL
  Filled 2021-10-30 (×28): qty 60

## 2021-10-30 MED ORDER — FREE WATER
150.0000 mL | Status: DC
  Administered 2021-10-30 – 2021-11-11 (×73): 150 mL

## 2021-10-30 NOTE — Progress Notes (Signed)
Speech Language Pathology Treatment: Dysphagia  Patient Details Name: [name] MRN: [id number] DOB: February 06, 1966 Today's Date: 10/30/2021 Time: 0940-1000 SLP Time Calculation (min) (ACUTE ONLY): 20 min  Assessment / Plan / Recommendation Clinical Impression  Provided maximal assistance and cueing to increase pt awareness of PO trials. Pt responds to placement of ice and puree/pudding to his mouth. Delayed oral manipulation. If given liquids there is anterior spillage and delayed coughing. Pt did speak a few times during session, vocal quality relatively clear indicating improvement since extubation. Swallow with pudding quite consistent; hopeful that more frequent opportunities to eat will increase awareness to PO. Pt to remain NPO except for bites of pudding or puree for pleasure from staff or family. Signs posted and order updated.   HPI HPI: 56 year old male with past medical history significant for polysubstance abuse, hepatitis C, and GERD who presented to [facility] in [place] on 9/6 after being found down. He was bagged in route to the emergency department at which he arrived around 2300 hrs.  He was intubated immediately upon arrival to the ED 9/6.  CT of the head demonstrated cerebral edema without loss of white matter differentiation.  MRI shows Multifocal, primarily small acute infarcts involving bilateral cerebral hemispheres.. Extubated 9/18.      SLP Plan  Continue with current plan of care      Recommendations for follow up therapy are one component of a multi-disciplinary discharge planning process, led by the attending physician.  Recommendations may be updated based on patient status, additional functional criteria and insurance authorization.    Recommendations  Diet recommendations: NPO;Other(comment) (bites of pudding/puree) Medication Administration: Via alternative means                Oral Care Recommendations: Oral care QID Follow Up  Recommendations: Skilled nursing-short term rehab (<3 hours/day) Assistance recommended at discharge: Frequent or constant Supervision/Assistance SLP Visit Diagnosis: Dysphagia, unspecified (R13.10) Plan: Continue with current plan of care           Diavian Furgason, Katherene Ponto  10/30/2021, 12:44 PM

## 2021-10-30 NOTE — Progress Notes (Signed)
[name] OIZ:[id number] DOB: Jun 21, 1965 DOA: 10/21/2021 PCP: [name], MD   Subj: 56 year old WM PMHx Polysubstance abuse, Hepatitis C,   Presented to [facility] in [place] on 9/6 after being found down.  He was last known well at approximately 13:00 hours that day however EMS was called for wellness check at some time later on.  He was found to be down and unresponsive.  On EMS evaluation the patient was apneic with thready pulse.  He did not responded to intranasal Narcan.  He was bagged in route to the emergency department and subsequently intubated on arrival to the ED.  CT head demonstrated cerebral edema without loss of white matter differentiation.  He was transferred to [facility] for MRI.  On 9/11 he was noted to have upper extremity edema on the right.  Ultrasound demonstrated acute thrombosis in the mid and distal basilic vein.  Hospital course also complicated by aspiration pneumonia.  Subsequently transferred to [facility] on 9/13.   Obj: 9/22 A/O x1 (does not know where, when, why).  Will follow some commands.  Currently lying in bed with a Flexi-Seal and Foley catheter   Objective: VITAL SIGNS: Temp: 98.7 F (37.1 C) (09/22 0806) Temp Source: Axillary (09/22 0806) BP: 125/100 (09/22 0806) Pulse Rate: 97 (09/22 0806) SPO2; FIO2:   Intake/Output Summary (Last 24 hours) at 10/30/2021 0835 Last data filed at 10/30/2021 0400 Gross per 24 hour  Intake 1860 ml  Output 600 ml  Net 1260 ml     Exam: General: A/O x1 (does not know where, when, why) No acute respiratory distress Lungs: Clear to auscultation bilaterally without wheezes or crackles Cardiovascular: Regular rate and rhythm without murmur gallop or rub normal S1 and S2 Abdomen: Nontender, nondistended, soft, bowel sounds positive, no rebound, no ascites, no appreciable mass Extremities: No significant cyanosis, clubbing, or edema bilateral lower extremities Skin: Negative rashes,  lesions, ulcers Psychiatric: Unable to evaluate secondary to altered mental status. Central nervous system: Talks but does not make sense.  Follows some commands.  Spontaneously moves all extremities.   .    Mobility Assessment (last 72 hours)     Mobility Assessment     Row Name 10/29/21 2000 10/28/21 2000 10/28/21 1347 10/28/21 1319 10/27/21 1348   Does patient have an order for bedrest or is patient medically unstable No - Continue assessment No - Continue assessment -- -- --   What is the highest level of mobility based on the progressive mobility assessment? Level 1 (Bedfast) - Unable to balance while sitting on edge of bed Level 1 (Bedfast) - Unable to balance while sitting on edge of bed Level 1 (Bedfast) - Unable to balance while sitting on edge of bed Level 1 (Bedfast) - Unable to balance while sitting on edge of bed Level 1 (Bedfast) - Unable to balance while sitting on edge of bed   Is the above level different from baseline mobility prior to current illness? Yes - Recommend PT order Yes - Recommend PT order -- -- --              DVT prophylaxis: Lovenox Code Status: Full Family Communication:  Status is: Inpatient    Dispo: The patient is from: Home              Anticipated d/c is to: SNF              Anticipated d/c date is: > 3 days  Patient currently is not medically stable to d/c.    Procedures/Significant Events: 9/6 found down > present to [facility] ED. CT with bilateral cerebellar edema. 9/7 repeat CT normal Unable to be weaned from sedation 9/11 RUE DVT started on tx dose lovenox 9/13 transfer to [facility] for MRI 9/14 chest tube placed with intrapleural fibrinolytic administration 9/16 chest tube removed 9/17 patient extubated, remained agitated and started on precedex 9/19 Cortrak placed    Consultants:  Neurology Phone consult hematology oncology   Cultures   Antimicrobials:   A/P 1-Acute toxic metabolic encephalopathy in the  setting of ischemic stroke: -On high-dose thiamine -In the setting of acute stroke. -LP performed, meningitis panel was negative. -UDS was positive for cocaine. -EEG-persistently showed no epileptiform activity -Per neurology high suspicion for anoxic brain injury versus toxin induced encephalopathy and ischemic strokes. -Neurology recommend continue with high-dose thiamine, no immediate indication for addition of antiplatelets for secondary stroke prevention as patient is on therapeutic Lovenox.  When patient is off of anticoagulation aspirin should be started.   2-Multifocal cerebral infarcts: -Neurology recommended to start aspirin when patient is off of anticoagulation -Currently on Lovenox for treatment of vein thrombosis -See above.      3-Acute hypoxic respiratory failure secondary to aspiration pneumonia Right-sided aspiration pneumonia with parapneumonic effusion: -Status post chest tube placement with intrapleural lytics on 9/14, chest tube removed 9/16. -Completed 7 days of IV Zosyn -Intubated on admission outside hospital 9/6. -Extubated 9/17. -Currently on Room air.  -will need chest x ray in 24-48 hours follow up effusion.    Superficial vein thrombosis/RIGHT basilic vein thrombosis -Diagnosed 9/11 with right basilic vein thrombus -Due to increase for progression of thrombosis patient was a started on Lovenox -9/22 discussed case with Dr. Clelia Croft oncology concurred that patient would need a minimum of 6 months of anticoagulation.   Thrombocytosis: -Will be on anticoagulation for minimum 6 months -Will need to add aspirin once off of anticoagulation.    Inadequate Oral Intake, in setting stroke, acute illness.  -Currently has core track.  -IR consulted for PEg tube placement.    Mild transaminases, Hx Hepatitis C -Unsure if treated.  Will obtain RNA quant level -Trend ammonia level. -Mild elevated ammonia at 48---32. -Received Lactulose   Diarrhea;  -Will ask  nutritionist if formula can be change, check GI pathogen.   Polysubstance abuse - 9/22 UDS pending   Goals of care - 9/22 Palliative Care Consult: Polysubstance abuse, hepatitis C positive, multifocal cerebral infarcts.  Evaluate for change of CODE STATUS to DNR, discussed short-term/long-term goals of care     Nutrition Problem: Inadequate oral intake Etiology: inability to eat         Care during the described time interval was provided by me .  I have reviewed this patient's available data, including medical history, events of note, physical examination, and all test results as part of my evaluation.

## 2021-10-30 NOTE — Evaluation (Signed)
Speech Language Pathology Evaluation Patient Details Name: Piper Albro MRN: [id number] DOB: 1965/07/07 Today's Date: 10/30/2021 Time: 0940-1000 SLP Time Calculation (min) (ACUTE ONLY): 20 min  Problem List:  Patient Active Problem List   Diagnosis Date Noted   Acute respiratory failure with hypoxia ([place])    Cerebrovascular accident (CVA) ([place])    Acute encephalopathy 10/22/2021   Fever    Pleural effusion    Deep venous thrombosis (HCC)    Pain and swelling of left lower leg 12/28/2018   Septic arthritis of shoulder, right ([place]) 09/07/2018   Septic arthritis of sacroiliac joint ([place]) 09/07/2018   Sternoclavicular joint pain, left 09/07/2018   Discitis of lumbosacral region 06/28/2018   Psoas abscess, right ([place]) 06/28/2018   Polysubstance abuse ([place]) 06/24/2018   Normocytic anemia 06/24/2018   Recurrent boils 06/24/2018   Head injury    Septic arthritis of knee, left (HCC)    Acute medial meniscal tear, left, initial encounter    GERD (gastroesophageal reflux disease) 06/22/2018   Mild protein-calorie malnutrition ([place]) 06/22/2018   AKI (acute kidney injury) ([place]) 06/21/2018   Spinal stenosis of lumbosacral region 06/21/2018   Chronic hepatitis C ([place]) 12/17/2013   Cigarette smoker 12/17/2013   Family history of diabetes mellitus (DM) 12/17/2013   Notalgia 12/17/2013   Past Medical History:  Past Medical History:  Diagnosis Date   Acid reflux    Head injury    Hepatitis C    Hiatal hernia    Past Surgical History:  Past Surgical History:  Procedure Laterality Date   ACROMIO-CLAVICULAR JOINT REPAIR Right 06/28/2018   Procedure: ACROMIO-CLAVICULAR JOINT IRRIGATION AND DEBRIDEMENT;  Surgeon: [name], MD;  Location: [place];  Service: Orthopedics;  Laterality: Right;   FACIAL FRACTURE SURGERY     IRRIGATION AND DEBRIDEMENT SHOULDER Right 06/28/2018   Procedure: IRRIGATION AND DEBRIDEMENT SHOULDER;  Surgeon: [name], MD;  Location: [place];  Service:  Orthopedics;  Laterality: Right;   KNEE ARTHROSCOPY Left 06/23/2018   Procedure: LEFT KNEE ARTHROSCOPY KNEE I&D.;  Surgeon: [name], MD;  Location: [place];  Service: Orthopedics;  Laterality: Left;   HPI:  56 year old male with past medical history significant for polysubstance abuse, hepatitis C, and GERD who presented to [facility] in [place] on 9/6 after being found down. He was bagged in route to the emergency department at which he arrived around 2300 hrs.  He was intubated immediately upon arrival to the ED 9/6.  CT of the head demonstrated cerebral edema without loss of white matter differentiation.  MRI shows Multifocal, primarily small acute infarcts involving bilateral cerebral hemispheres.. Extubated 9/18.   Assessment / Plan / Recommendation Clinical Impression  Pt demonstrates severe cognitive impairment; pt does not focus attention to therapist or to environment. Gaze is roving, sometimes fixed with dilated pupils, no tracking. Pt does not attend to PO or initiate reaching for water cup despite max cues. When hand over hand assist is given with left hand (right hand flaccid) there is no activation. Pt does not respond in 90% of opportunities with social language. Pt replied Yeah and OK in session today though and voice sounded clear. Will continue opportunities for cognitive engagement. Recommend SNF placement at d/c.    SLP Assessment  SLP Recommendation/Assessment: Patient needs continued Speech Platea Pathology Services SLP Visit Diagnosis: Cognitive communication deficit (R41.841)    Recommendations for follow up therapy are one component of a multi-disciplinary discharge planning process, led by the attending physician.  Recommendations  may be updated based on patient status, additional functional criteria and insurance authorization.    Follow Up Recommendations  Skilled nursing-short term rehab (<3 hours/day)    Assistance Recommended at Discharge   Frequent or constant Supervision/Assistance  Functional Status Assessment    Frequency and Duration           SLP Evaluation Cognition  Overall Cognitive Status: Impaired/Different from baseline Arousal/Alertness: Awake/alert Orientation Level: Other (comment) (no response) Attention: Focused Focused Attention: Impaired       Comprehension  Auditory Comprehension Overall Auditory Comprehension: Impaired Yes/No Questions: Impaired Basic Biographical Questions: 0-25% accurate Commands: Impaired One Step Basic Commands: 0-24% accurate    Expression Verbal Expression Overall Verbal Expression: Impaired Initiation: Impaired Automatic Speech: Social Response   Oral / Motor  Oral Motor/Sensory Function Overall Oral Motor/Sensory Function: Mild impairment (genralized weakness) Motor Speech Overall Motor Speech:  (UTA) Respiration: Within functional limits Phonation: Normal            Younes Degeorge, Riley Nearing 10/30/2021, 12:54 PM

## 2021-10-30 NOTE — Progress Notes (Signed)
Occupational Therapy Treatment Patient Details Name: [name] MRN: [id number] DOB: 12/14/1965 Today's Date: 10/30/2021   History of present illness Pt is 56 yo male admitted on to [facility] health in [place] on 9/6 after being found unresponsive.  Pt transferred to [facility] on 10/21/21.  Pt with acute toxic metabolic encephalopathy vs encephalitis in pt with acute ischemic strokes per neurology.  9/11 R UE DVT started on lovenox, 9/14-9/16 chest tube, ETT 9/6-9/18.  Hx of polysubstance abuse, hep C, and GERD.   OT comments  Pt supine and alert on arrival. Pt with occasional fixed gaze, but not making eye contact. Pt continues to present with decreased communication, command following, problem solving, attention, balance, strength, UE functional use, and activity tolerance. Pt following ~10-20% of direct simple commands this session, with greater command following during bed mobility and functional grasp tasks. Pt requiring mod-max A +2 for all aspects of bed mobility but with fair initiation with increased time. Facilitating normalized movement patterns with functional reach and weightbearing on UE with lateral leans in preparation for participation in ADL. Total A to wash face. Continue to recommend SNF to optimize safety and independence in ADL and IADL.    Recommendations for follow up therapy are one component of a multi-disciplinary discharge planning process, led by the attending physician.  Recommendations may be updated based on patient status, additional functional criteria and insurance authorization.    Follow Up Recommendations  Skilled nursing-short term rehab (<3 hours/day)    Assistance Recommended at Discharge Frequent or constant Supervision/Assistance  Patient can return home with the following  Two people to help with walking and/or transfers;A lot of help with bathing/dressing/bathroom;Assistance with cooking/housework;Assistance with feeding;Direct supervision/assist for medications  management;Direct supervision/assist for financial management;Assist for transportation;Help with stairs or ramp for entrance   Equipment Recommendations  Hospital bed;Wheelchair (measurements OT);Wheelchair cushion (measurements OT);BSC/3in1;Other (comment) (hoyer lift)    Recommendations for Other Services      Precautions / Restrictions Precautions Precautions: Fall Restrictions Weight Bearing Restrictions: No       Mobility Bed Mobility Overal bed mobility: Needs Assistance Bed Mobility: Supine to Sit, Sit to Supine Rolling: Mod assist, Max assist, +2 for physical assistance, +2 for safety/equipment   Supine to sit: Total assist, +2 for physical assistance, +2 for safety/equipment, Max assist Sit to supine: Max assist, Mod assist, +2 for physical assistance, +2 for safety/equipment   General bed mobility comments: max A to roll toward L. Pt with good initiation, and as little as mod A +2 to roll R using L hand and LE for leverage. Pt initially with good initiation to bring LE off of bed, with greater use of LLE than R, but ultimately heavy hmax A for sidelying to sit due to pt attempts to bring BLE back onto bed. Max A for guidance and bringing BLE back into bed, but pt with active participation throughout, Min A for LLE, Mod A for RLE, and mod A for trunk.    Transfers                   General transfer comment: Focus session on sitting balance, weight shift, functional reach, and folowing commands.     Balance Overall balance assessment: Needs assistance Sitting-balance support: Feet supported, Bilateral upper extremity supported Sitting balance-Leahy Scale: Poor Sitting balance - Comments: sitting EOB able to maintain sitting with mod A, assist on forehead to hold up head; EOB >10  mins Postural control: Posterior lean  ADL either performed or assessed with clinical judgement   ADL Overall ADL's : Needs  assistance/impaired     Grooming: Total assistance;Bed level Grooming Details (indicate cue type and reason): Resistance to shoulder flexion to reach face with hand over hand, but later in session, reaching toward L side with R hand. Difficulty processing and following commands.                     Toileting- Clothing Manipulation and Hygiene: Total assistance;+2 for safety/equipment;+2 for physical assistance;Bed level Toileting - Clothing Manipulation Details (indicate cue type and reason): Posterior pericare to switch bed pads       General ADL Comments: Max A +2 at this time. Total A for many ADL    Extremity/Trunk Assessment Upper Extremity Assessment Upper Extremity Assessment: RUE deficits/detail;LUE deficits/detail RUE Deficits / Details: no movement noted. Does not seem to experience pain with light weight bearig on RUE LUE Deficits / Details: shoulder shrug spontaneously, did not witness elbow flexion today - PROM WFL, gross spontaneous movement without purpose throughout session. would not move to remove washcloth from face. Squeeze hand (3/5MMT) on command with tactile cues and incresed time.   Lower Extremity Assessment Lower Extremity Assessment: Defer to PT evaluation        Vision   Vision Assessment?: Vision impaired- to be further tested in functional context Additional Comments: Non-focal gaze, "roams", R gaze preference, but will shift gaze with therapist and tech both on L side.   Perception     Praxis      Cognition Arousal/Alertness: Awake/alert Behavior During Therapy: Restless Overall Cognitive Status: Impaired/Different from baseline Area of Impairment: Following commands, Problem solving                       Following Commands: Follows one step commands inconsistently, Follows one step commands with increased time (Significantly increased time to follow commands with delayed initiation. Pt appears to be somewhat selective in  command following. Following ~20% of direct simple commands.)     Problem Solving: Slow processing, Requires verbal cues, Requires tactile cues, Decreased initiation, Difficulty sequencing General Comments: Pt verbalizing once clearly during session repeating after therapist "come on over". May respond well to music in future sessions. Pt requiring max increased time and frequently requiring up to max multimodal cues to follow commands. Pt with very slow processing. Able to initiate movement for rolling and bring feet off of bed, but ultimately max A to sequence entire movement        Exercises Exercises: Other exercises Other Exercises Other Exercises: PROM of Bil UE and hands to assess that there are no contractures Other Exercises: Facilitating normalized movement patterns with functional reach using LUE and RUE to reach across midline with max-total A. Pt did intermittently follow hand with visual gaze. Other Exercises: Facilitating weight shift and BUE weight bearing with lateral lean onto elbow toward L 2x and toward R 1x. Pt requiring Min A and max support posteriorly to return to midline from lean onto L elbow sitting EOB. Greater support required toward R, and pt with initiation of BLE into bed. Other Exercises: Roll L and R to promote command following. toward R 2x, and toward L 1x Other Exercises: Positioning in sidelying to relieve pressure off of bottom.    Shoulder Instructions       General Comments VSS    Pertinent Vitals/ Pain       Pain Assessment Pain Assessment: Faces Faces  Pain Scale: Hurts little more Pain Location: generalized with moving bed to chair position, likely back side/bottom Pain Descriptors / Indicators: Discomfort Pain Intervention(s): Limited activity within patient's tolerance, Monitored during session  Home Living                                          Prior Functioning/Environment              Frequency  Min 2X/week         Progress Toward Goals  OT Goals(current goals can now be found in the care plan section)  Progress towards OT goals: Progressing toward goals  Acute Rehab OT Goals Patient Stated Goal: none stated OT Goal Formulation: Patient unable to participate in goal setting Time For Goal Achievement: 11/11/21 Potential to Achieve Goals: Fair ADL Goals Pt Will Perform Grooming: with mod assist;sitting Pt Will Perform Upper Body Bathing: with mod assist;sitting Pt Will Transfer to Toilet: with max assist;stand pivot transfer;bedside commode Pt Will Perform Toileting - Clothing Manipulation and hygiene: with max assist;sit to/from stand Additional ADL Goal #1: Pt will sit EOB at min guard level for 5 min inorder to build up activity tolerance for ADL participation Additional ADL Goal #2: Pt will perform bed mobility as precursor to ADL at max A.  Plan Discharge plan remains appropriate    Co-evaluation                 AM-PAC OT "6 Clicks" Daily Activity     Outcome Measure   Help from another person eating meals?: Total Help from another person taking care of personal grooming?: Total Help from another person toileting, which includes using toliet, bedpan, or urinal?: Total Help from another person bathing (including washing, rinsing, drying)?: Total Help from another person to put on and taking off regular upper body clothing?: Total Help from another person to put on and taking off regular lower body clothing?: Total 6 Click Score: 6    End of Session    OT Visit Diagnosis: Other symptoms and signs involving cognitive function;Muscle weakness (generalized) (M62.81);Other abnormalities of gait and mobility (R26.89)   Activity Tolerance Patient tolerated treatment well   Patient Left in bed;with call bell/phone within reach;with bed alarm set   Nurse Communication Mobility status;Other (comment) (flexiseal with max air in it, and difficult to bring stool from tube into  bag despite use of gravity assist)        Time: 8144-8185 OT Time Calculation (min): 33 min  Charges: OT General Charges $OT Visit: 1 Visit OT Treatments $Self Care/Home Management : 8-22 mins $Therapeutic Activity: 8-22 mins  Shanda Howells, OTR/L [facility] Acute Rehabilitation Office: [phone]   [name] 10/30/2021, 3:40 PM

## 2021-10-30 NOTE — Progress Notes (Signed)
HOSPITAL MEDICINE OVERNIGHT EVENT NOTE    Notified by nursing that patient continues to be in enteric precautions.  Chart reviewed, patient has been experiencing ongoing diarrhea and has recently had a GI pathology panel performed that was negative.  Additionally, on 9/14 patient had a C. difficile panel performed which was also negative.  Further review of the chart reveals that the patient is likely having diarrhea due to ongoing tube feeds.  Due to patient being immobile, patient currently has a Flexi-Seal in place at this time which should hopefully be discontinued as soon as possible once diarrhea improves.  We will discontinue enteric cautions at this time.   Vernelle Emerald  MD [facility]

## 2021-10-30 NOTE — Progress Notes (Addendum)
     Referral received for [name] re: goals of care discussion. Chart reviewed and patient assessed with SLP at bedside as well. Patient is unable to engage appropriately in discussions. I attempted to contact patient's daughter [name] 2 today. Unable to reach. HIPAA appropriate voicemail left with PMT contact information given.   PMT will re-attempt to contact family at a later time/date. Detailed note and recommendations to follow once GOC has been completed.   Thank you for your referral and allowing PMT to assist in [name] care.   Jordan Hawks, [name]-BC Palliative Medicine Team  Phone: [phone]  NO CHARGE

## 2021-10-30 NOTE — Progress Notes (Addendum)
Nutrition Follow-up  DOCUMENTATION CODES:  Severe malnutrition in context of social or environmental circumstances  INTERVENTION:  Continue TF via cortrak. Adjust to the following: Jevity 1.5 at 44mL/h (1.44L/d) Prosource TF20 1x/d Free water flush q4h This provides 2240kcal, 112g protein, and free water Banatrol BID via tube  NUTRITION DIAGNOSIS:  Severe Malnutrition related to social / environmental circumstances (drug abuse) as evidenced by severe fat depletion, severe muscle depletion. - remains applicable  GOAL:  Patient will meet greater than or equal to 90% of their needs - progressing  MONITOR:  TF tolerance, Diet advancement, Labs  REASON FOR ASSESSMENT:  Consult Assessment of nutrition requirement/status (Patient with Diarrhea, should we change formula?)  ASSESSMENT:   56 yo male admitted to [facility] after being found down. Transferred to [facility] with concerns for anoxic brain injury (CT showed bilateral cerebellar edema). PMH includes polysubstance abuse, hepatitis C, GERD, smoker.  9/13 - admitted from outside facility intubated 9/14 - TF initiated 9/18 - extubated, SLP eval, recommended NPO 9/19 - cortrak placed  Pt resting in bed at the time of assessment. Did not answer questions or communicate.  New consult received as pt is having diarrhea. Pt was on lactulose 9/16-9/19 which likely contributed to high stool output during that timeframe. However, significant amount of stool present in bag at the time of assessment. Pt being evaluated for GI pathogens. Will adjust TF order to one with soluble fiber to aid in bulking stool and also banatrol.  Noted that TF currently infusing at 18mL/h for unclear reason. Pt vomited several days ago and it appears to have been turned down at that time.   Intake/Output Summary (Last 24 hours) at 10/30/2021 1452 Last data filed at 10/30/2021 0400 Gross per 24 hour  Intake 1860 ml  Output 600 ml  Net 1260 ml   Net IO Since Admission: -1,942.06 mL [10/30/21 1452]  Nutritionally Relevant Medications: Scheduled Meds:  feeding supplement (PROSource TF20)  60 mL Per Tube Daily   free water  200 mL Per Tube Q4H   pantoprazole  40 mg Per Tube Daily   Continuous Infusions:  feeding supplement (OSMOLITE 1.5 CAL) 60 mL/hr at 10/29/21 2000   thiamine (VITAMIN B1) injection 250 mg (10/30/21 1013)   Labs Reviewed: CBG ranges from 113-133 mg/dL over the last 24 hours  NUTRITION - FOCUSED PHYSICAL EXAM:  Flowsheet Row Most Recent Value  Orbital Region Mild depletion  Upper Arm Region Severe depletion  Thoracic and Lumbar Region Severe depletion  Buccal Region Mild depletion  Temple Region Mild depletion  Clavicle Bone Region Severe depletion  Clavicle and Acromion Bone Region Severe depletion  Scapular Bone Region Mild depletion  Dorsal Hand Severe depletion  Patellar Region Severe depletion  Anterior Thigh Region Severe depletion  Posterior Calf Region Severe depletion  Edema (RD Assessment) None  Hair Reviewed  [tangled, unkept]  Eyes Reviewed  Mouth Reviewed  Skin Reviewed  Nails Reviewed   Diet Order:   Diet Order             Diet NPO time specified Except for: Other (See Comments)  Diet effective now                   EDUCATION NEEDS:  Not appropriate for education at this time  Skin:  Skin Assessment: Reviewed RN Assessment  Last BM:  9/22 - fecal containment device in place  Height:  Ht Readings from Last 1 Encounters:  10/21/21 5\' 10"  (1.778 m)  Weight:  Wt Readings from Last 1 Encounters:  10/30/21 71.8 kg    Ideal Body Weight:  75.5 kg  BMI:  Body mass index is 22.71 kg/m.  Estimated Nutritional Needs:  Kcal:  2200-2400 Protein:  110-120 gm Fluid:  2.2-2.4 L   Ranell Patrick, RD, LDN Clinical Dietitian RD pager # available in [place]  After hours/weekend pager # available in [facility]

## 2021-10-31 ENCOUNTER — Inpatient Hospital Stay (HOSPITAL_COMMUNITY): Payer: Medicaid Other

## 2021-10-31 DIAGNOSIS — G931 Anoxic brain damage, not elsewhere classified: Secondary | ICD-10-CM | POA: Diagnosis not present

## 2021-10-31 DIAGNOSIS — J9601 Acute respiratory failure with hypoxia: Secondary | ICD-10-CM | POA: Diagnosis not present

## 2021-10-31 DIAGNOSIS — I82401 Acute embolism and thrombosis of unspecified deep veins of right lower extremity: Secondary | ICD-10-CM | POA: Diagnosis not present

## 2021-10-31 DIAGNOSIS — Z7189 Other specified counseling: Secondary | ICD-10-CM | POA: Diagnosis not present

## 2021-10-31 DIAGNOSIS — Z515 Encounter for palliative care: Secondary | ICD-10-CM | POA: Diagnosis not present

## 2021-10-31 DIAGNOSIS — E43 Unspecified severe protein-calorie malnutrition: Secondary | ICD-10-CM | POA: Insufficient documentation

## 2021-10-31 DIAGNOSIS — I63011 Cerebral infarction due to thrombosis of right vertebral artery: Secondary | ICD-10-CM | POA: Diagnosis not present

## 2021-10-31 DIAGNOSIS — G9340 Encephalopathy, unspecified: Secondary | ICD-10-CM | POA: Diagnosis not present

## 2021-10-31 LAB — CBC WITH DIFFERENTIAL/PLATELET
Abs Immature Granulocytes: 0.31 10*3/uL — ABNORMAL HIGH (ref 0.00–0.07)
Basophils Absolute: 0.1 10*3/uL (ref 0.0–0.1)
Basophils Relative: 1 %
Eosinophils Absolute: 0.2 10*3/uL (ref 0.0–0.5)
Eosinophils Relative: 2 %
HCT: 41.9 % (ref 39.0–52.0)
Hemoglobin: 14 g/dL (ref 13.0–17.0)
Immature Granulocytes: 2 %
Lymphocytes Relative: 17 %
Lymphs Abs: 2.2 10*3/uL (ref 0.7–4.0)
MCH: 31.4 pg (ref 26.0–34.0)
MCHC: 33.4 g/dL (ref 30.0–36.0)
MCV: 93.9 fL (ref 80.0–100.0)
Monocytes Absolute: 0.7 10*3/uL (ref 0.1–1.0)
Monocytes Relative: 5 %
Neutro Abs: 10 10*3/uL — ABNORMAL HIGH (ref 1.7–7.7)
Neutrophils Relative %: 73 %
Platelets: 740 10*3/uL — ABNORMAL HIGH (ref 150–400)
RBC: 4.46 MIL/uL (ref 4.22–5.81)
RDW: 12.9 % (ref 11.5–15.5)
WBC: 13.5 10*3/uL — ABNORMAL HIGH (ref 4.0–10.5)
nRBC: 0 % (ref 0.0–0.2)

## 2021-10-31 LAB — AMMONIA: Ammonia: 42 umol/L — ABNORMAL HIGH (ref 9–35)

## 2021-10-31 LAB — COMPREHENSIVE METABOLIC PANEL
ALT: 60 U/L — ABNORMAL HIGH (ref 0–44)
AST: 34 U/L (ref 15–41)
Albumin: 2.9 g/dL — ABNORMAL LOW (ref 3.5–5.0)
Alkaline Phosphatase: 118 U/L (ref 38–126)
Anion gap: 9 (ref 5–15)
BUN: 16 mg/dL (ref 6–20)
CO2: 25 mmol/L (ref 22–32)
Calcium: 9.5 mg/dL (ref 8.9–10.3)
Chloride: 102 mmol/L (ref 98–111)
Creatinine, Ser: 0.73 mg/dL (ref 0.61–1.24)
GFR, Estimated: >60 mL/min (ref >60)
Glucose, Bld: 124 mg/dL — ABNORMAL HIGH (ref 70–99)
Potassium: 4.3 mmol/L (ref 3.5–5.1)
Sodium: 136 mmol/L (ref 135–145)
Total Bilirubin: 0.4 mg/dL (ref 0.3–1.2)
Total Protein: 7.3 g/dL (ref 6.5–8.1)

## 2021-10-31 LAB — GLUCOSE, CAPILLARY
Glucose-Capillary: 109 mg/dL — ABNORMAL HIGH (ref 70–99)
Glucose-Capillary: 119 mg/dL — ABNORMAL HIGH (ref 70–99)
Glucose-Capillary: 122 mg/dL — ABNORMAL HIGH (ref 70–99)
Glucose-Capillary: 142 mg/dL — ABNORMAL HIGH (ref 70–99)
Glucose-Capillary: 149 mg/dL — ABNORMAL HIGH (ref 70–99)
Glucose-Capillary: 156 mg/dL — ABNORMAL HIGH (ref 70–99)

## 2021-10-31 LAB — MAGNESIUM: Magnesium: 2.2 mg/dL (ref 1.7–2.4)

## 2021-10-31 LAB — PHOSPHORUS: Phosphorus: 3.9 mg/dL (ref 2.5–4.6)

## 2021-10-31 LAB — CK TOTAL AND CKMB (NOT AT ARMC)
CK, MB: 1.3 ng/mL (ref 0.5–5.0)
Relative Index: INVALID (ref 0.0–2.5)
Total CK: 41 U/L — ABNORMAL LOW (ref 49–397)

## 2021-10-31 MED ORDER — LOPERAMIDE HCL 1 MG/7.5ML PO SUSP
4.0000 mg | Freq: Three times a day (TID) | ORAL | Status: DC
  Administered 2021-10-31 – 2021-11-01 (×3): 4 mg
  Filled 2021-10-31 (×5): qty 30

## 2021-10-31 NOTE — Consult Note (Addendum)
Palliative Medicine Inpatient Consult Note  Consulting Provider: Drema Dallas, MD  Reason for consult:   Palliative Care Consult Services Palliative Medicine Consult  Reason for Consult? Polysubstance abuse, hepatitis C positive, multifocal cerebral infarcts.  Evaluate for change of CODE STATUS to DNR, discussed short-term/long-term goals of care   10/31/2021  HPI:  Per intake H&P --> 56 year old WM PMHx Polysubstance abuse, Hepatitis C, and GERD.   Presented to [facility] in [place] on 9/6 after being found down.  He was last known well at approximately 13:00 hours that day however EMS was called for wellness check at some time later on.  He was found to be down and unresponsive.  On EMS evaluation the patient was apneic with thready pulse.  He did not responded to intranasal Narcan.  He was bagged in route to the emergency department and subsequently intubated on arrival to the ED.  CT head demonstrated cerebral edema without loss of white matter differentiation.  He was transferred to [facility] for MRI.  On 9/11 he was noted to have upper extremity edema on the right.  Ultrasound demonstrated acute thrombosis in the mid and distal basilic vein.  Hospital course also complicated by aspiration pneumonia.  Subsequently transferred to [facility] on 9/13.    Palliative care was asked to get involved to further address goals of care in the setting of an acute stroke.  Clinical Assessment/Goals of Care:  *Please note that this is a verbal dictation therefore any spelling or grammatical errors are due to the "Dragon Medical One" system interpretation.  I have reviewed medical records including EPIC notes, labs and imaging, received report from bedside RN, assessed the patient.    I called patient's daughter Mohd. Derflinger to further discuss diagnosis prognosis, GOC, EOL wishes, disposition and options.   I introduced Palliative Medicine as specialized medical care for  people living with serious illness. It focuses on providing relief from the symptoms and stress of a serious illness. The goal is to improve quality of life for both the patient and the family.  Medical History Review and Understanding:  [name] and I reviewed that Dahlstrom past medical history is quite significant in the setting of his polysubstance abuse.  She shares that throughout her upbringing she was aware of his use of "crack" cocaine.  Social History:  Arther was married though is divorced. He has an older daughter, [name] who is 72 years old. He has five other children with a former partner, Clyde Lundborg ages 38 months - 73 years old.   Patient has two former girlfriends Amy Julien Girt and Johnanna Schneiders - there is concern regarding their treatment of the patient therefore they are to receive no information on his hospitalization/care.   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 He is extremely artistic and build beautiful barns/stables/homes.  [name] shares that her relationship with her father has been fragmented over the years though she expresses that ultimately when he was sober he was an absolutely wonderful man who had a lot of pride in himself in terms of how he presented.  Functional and Nutritional State:  Prior to hospitalization Jakyrie was living in a rental property.  [name] believes he was doing fairly well and able to attend to his B ADLs.  Advance Directives:  A detailed discussion was had today regarding advanced directives.  Being that Derrall is not legally married and has 6 children 5 of whom are  under age his oldest daughter, [name] would be his surrogate Management consultant.  Code Status:  Margo Aye that her father would want the opportunity to continue continue fighting and living if presented as such.  She at this point in time does not want to consider a DO NOT RESUSCITATE/DO NOT INTUBATE CODE STATUS.  [name] would like to  continue with full code and full scope of care medical treatment.  Discussion:  [name] and I reviewed how stressful it has been with Pauline being hospitalized initially at [facility].  She shares that she thought for him to transition to [facility] as she felt the care at such a small hospital was fairly diminutive.  She expresses that she was very happy with the care Fayrene Fearing received in the intensive care unit and the safety precautions which were instilled to ensure no additional outside sources got information on Daquavion or his clinical condition.  She shares that she realizes Kaamil has polysubstance abuse and has suffered from infarctions though she at this point time would like to give him every opportunity to improve inclusive of aggressive rehabilitation.  I shared that I would advocate for this and vocalize her desires moving forward.  [name] shares that her father has made great improvements in the last 3 weeks and with him having such small children she would at the very least like to see him a optimized from a medical physical perspective to be present for his children.  [name] shares per conversation with the intensivist, Dr. Katrinka Blazing as well as the neurologist she was encouraged that Saiquan could make a meaningful recovery within 6 to 12 months.  We talked about the difficulty in making decisions for someone who is not identified their wishes of their own volition.  We reviewed that in an ideal world Kayde would be able to tell us what he would and would not want.  We discussed quite a bit about patient's safety and the concern that this may be impaired if additional information were to be given to either of his girlfriends.  I shared that I would communicate with the security office, nursing staff, as well as executive leadership for better communication on this.  Discussed the importance of continued conversation with family and their  medical providers regarding overall plan of care and  treatment options, ensuring decisions are within the context of the patients values and GOCs.  Decision Maker: Yosiah, Jasmin (Daughter): [phone] (Mobile)  SUMMARY OF RECOMMENDATIONS   Full code/full scope of care  Patient's daughter would like every opportunity for him to improve inclusive of acute rehabilitation  The palliative care team has communicated with the staff on 3W, the security office, and nursing leadership to further elucidate the concerns about Jameses safety  Of note the only person to receive any information on Math is his daughter Siddh Vandeventer and her partner Nori Riis  Palliative care will continue to support Fabiano and his family throughout his complex hospitalization  Code Status/Advance Care Planning: FULL CODE  Palliative Prophylaxis:  Aspiration, Bowel Regimen, Delirium Protocol, Frequent Pain Assessment, Oral Care, Palliative Wound Care, and Turn Reposition  Additional Recommendations (Limitations, Scope, Preferences): Continue current care  Psycho-social/Spiritual:  Desire for further Chaplaincy support: Not presently Additional Recommendations: Discussed patient's acute on chronic disease process   Prognosis: Unclear at this time  Discharge Planning: Patient's daughter would like to advocate strongly for acute rehabilitation  Vitals:   10/31/21 0426 10/31/21 0700  BP: (!) 128/91 125/78  Pulse: (!) 106 76  Resp: 16 16  Temp: 98.2 F (36.8 C) 98 F (36.7 C)  SpO2: 100% 100%   No intake or output data in the 24 hours ending 10/31/21 1037 Last Weight  Most recent update: 10/31/2021  7:25 AM    Weight  75.5 kg (166 lb 7.2 oz)            Gen: Older Caucasian male in no acute distress HEENT: Cord track in place, dry mucous membranes CV: Irregular rate and regular rhythm  PULM: On room air breathing is even and nonlabored ABD: soft/nontender  EXT: No edema  Neuro: Alert to self  PPS: 20%   This conversation/these  recommendations were discussed with patient primary care team, Dr. Sherral Hammers  Total Time: 14  Billing based on MDM: High  Problems Addressed: One acute or chronic illness or injury that poses a threat to life or bodily function  Amount and/or Complexity of Data: Category 3:Discussion of management or test interpretation with external physician/other qualified health care professional/appropriate source (not separately reported)  Risks: Decision regarding hospitalization or escalation of hospital care and Decision not to resuscitate or to de-escalate care because of poor prognosis ______________________________________________________ [place] Team Team Cell Phone: [phone] Please utilize secure chat with additional questions, if there is no response within 30 minutes please call the above phone number  Palliative Medicine Team providers are available by phone from 7am to 7pm daily and can be reached through the team cell phone.  Should this patient require assistance outside of these hours, please call the patient's attending physician.

## 2021-10-31 NOTE — Progress Notes (Signed)
[name] [id number] DOB: June 04, 1965 DOA: 10/21/2021 PCP: [name], MD   Subj: 56 year old WM PMHx Polysubstance abuse, Hepatitis C,   Presented to [name] on 9/6 after being found down.  He was last known well at approximately 13:00 hours that day however EMS was called for wellness check at some time later on.  He was found to be down and unresponsive.  On EMS evaluation the patient was apneic with thready pulse.  He did not responded to intranasal Narcan.  He was bagged in route to the emergency department and subsequently intubated on arrival to the ED.  CT head demonstrated cerebral edema without loss of white matter differentiation.  He was transferred to [facility] for MRI.  On 9/11 he was noted to have upper extremity edema on the right.  Ultrasound demonstrated acute thrombosis in the mid and distal basilic vein.  Hospital course also complicated by aspiration pneumonia.  Subsequently transferred to [name] on 9/13.   Obj: 9/23 afebrile overnight, A/O x0, not following any commands minimal withdrawal to pain.  Pupils appear dilated minimally responsive    Objective: VITAL SIGNS: Temp: 98.3 F (36.8 C) (09/23 1100) Temp Source: Axillary (09/23 1100) BP: 120/74 (09/23 1100) Pulse Rate: 71 (09/23 1100) SPO2; FIO2:  No intake or output data in the 24 hours ending 10/31/21 1251    Exam: General: A/O x0, No acute respiratory distress Lungs: Clear to auscultation bilaterally without wheezes or crackles Cardiovascular: Regular rate and rhythm without murmur gallop or rub normal S1 and S2 Abdomen: Nontender, nondistended, soft, bowel sounds positive, no rebound, no ascites, no appreciable mass Extremities: No significant cyanosis, clubbing, or edema bilateral lower extremities Skin: Negative rashes, lesions, ulcers Psychiatric: Unable to evaluate secondary to altered mental status. Central nervous system: Minimally responsive to  painful stimuli, eyes dilated minimally responsive to light.   .    Mobility Assessment (last 72 hours)     Mobility Assessment     Row Name 10/30/21 1500 10/29/21 2000 10/28/21 2000 10/28/21 1347 10/28/21 1319   Does patient have an order for bedrest or is patient medically unstable -- No - Continue assessment No - Continue assessment -- --   What is the highest level of mobility based on the progressive mobility assessment? Level 1 (Bedfast) - Unable to balance while sitting on edge of bed Level 1 (Bedfast) - Unable to balance while sitting on edge of bed Level 1 (Bedfast) - Unable to balance while sitting on edge of bed Level 1 (Bedfast) - Unable to balance while sitting on edge of bed Level 1 (Bedfast) - Unable to balance while sitting on edge of bed   Is the above level different from baseline mobility prior to current illness? -- Yes - Recommend PT order Yes - Recommend PT order -- --              DVT prophylaxis: Lovenox Code Status: Full Family Communication:  Status is: Inpatient    Dispo: The patient is from: Home              Anticipated d/c is to: SNF              Anticipated d/c date is: > 3 days              Patient currently is not medically stable to d/c.    Procedures/Significant Events: 9/6 found down > present to [facility] ED. CT with bilateral cerebellar edema. 9/7 repeat CT  normal Unable to be weaned from sedation 9/11 RUE DVT started on tx dose lovenox 9/13 transfer to [facility] for MRI 9/14 chest tube placed with intrapleural fibrinolytic administration 9/16 chest tube removed 9/17 patient extubated, remained agitated and started on precedex 9/19 Cortrak placed    Consultants:  Neurology Phone consult hematology oncology   Cultures 9/14 C. difficile antigen negative 9/14 C. difficile toxin negative 9/21 GI panel negative   Antimicrobials:   A/P Acute toxic metabolic encephalopathy/ischemic stroke/anoxic brain injury: -On high-dose  thiamine -In the setting of acute stroke. -LP performed, meningitis panel was negative.  (Previous hospital on chart) -UDS was positive for cocaine.(Previous hospital on chart) -EEG-persistently showed no epileptiform activity -Per neurology high suspicion for anoxic brain injury versus toxin induced encephalopathy and ischemic strokes. -Neurology recommend continue with high-dose thiamine, no immediate indication for addition of antiplatelets for secondary stroke prevention as patient is on therapeutic Lovenox.  When patient is off of anticoagulation aspirin should be started. -9/23 repeat CT head pending extension of stroke?   Multifocal cerebral infarcts: -Neurology recommended to start aspirin when patient is off of anticoagulation -Currently on Lovenox for treatment of vein thrombosis -See above.      Acute respiratory failure with hypoxia/ Right sided Aspiration Pneumonia w parapneumonic effusion: -Status post chest tube placement with intrapleural lytics on 9/14, chest tube removed 9/16. -Completed 7 days of IV Zosyn -Intubated on admission outside hospital 9/6. -Extubated 9/17. -Currently on Room air.  -will need chest x ray in 24-48 hours follow up effusion.    Superficial vein thrombosis/RIGHT basilic vein thrombosis -Diagnosed 9/11 with right basilic vein thrombus(Previous hospital on chart) -Due to increase for progression of thrombosis patient was a started on Lovenox -9/22 discussed case with Dr. Clelia Croft oncology concurred that patient would need a minimum of 6 months of anticoagulation.   Thrombocytosis: -Will be on anticoagulation for minimum 6 months -Will need to add aspirin once off of anticoagulation.    Inadequate Oral Intake, in setting stroke, acute illness.  -Currently has core track.  -IR consulted for PEg tube placement.    Mild transaminases, Hx Hepatitis C -Unsure if treated.  Will obtain RNA quant level -Trend ammonia level. -Mild elevated ammonia at  48---32. -Received Lactulose   Diarrhea;  -Will ask nutritionist if formula can be change -9/23 noninfectious - 9/23 Imodium 4 mg, per tube TID   Polysubstance abuse - 9/22 UDS pending   Goals of care - 9/22 Palliative Care Consult: Polysubstance abuse, hepatitis C positive, multifocal cerebral infarcts.  Evaluate for change of CODE STATUS to DNR, discussed short-term/long-term goals of care     Nutrition Problem: Inadequate oral intake Etiology: inability to eat         Care during the described time interval was provided by me .  I have reviewed this patient's available data, including medical history, events of note, physical examination, and all test results as part of my evaluation.

## 2021-10-31 NOTE — Plan of Care (Signed)
CT on hold per Dr Myna Hidalgo @ 2114

## 2021-11-01 DIAGNOSIS — Z7189 Other specified counseling: Secondary | ICD-10-CM | POA: Diagnosis not present

## 2021-11-01 DIAGNOSIS — G9340 Encephalopathy, unspecified: Secondary | ICD-10-CM | POA: Diagnosis not present

## 2021-11-01 DIAGNOSIS — Z515 Encounter for palliative care: Secondary | ICD-10-CM | POA: Diagnosis not present

## 2021-11-01 LAB — COMPREHENSIVE METABOLIC PANEL
ALT: 74 U/L — ABNORMAL HIGH (ref 0–44)
AST: 41 U/L (ref 15–41)
Albumin: 3 g/dL — ABNORMAL LOW (ref 3.5–5.0)
Alkaline Phosphatase: 125 U/L (ref 38–126)
Anion gap: 11 (ref 5–15)
BUN: 20 mg/dL (ref 6–20)
CO2: 24 mmol/L (ref 22–32)
Calcium: 9.7 mg/dL (ref 8.9–10.3)
Chloride: 103 mmol/L (ref 98–111)
Creatinine, Ser: 0.77 mg/dL (ref 0.61–1.24)
GFR, Estimated: >60 mL/min (ref >60)
Glucose, Bld: 114 mg/dL — ABNORMAL HIGH (ref 70–99)
Potassium: 4.1 mmol/L (ref 3.5–5.1)
Sodium: 138 mmol/L (ref 135–145)
Total Bilirubin: 0.4 mg/dL (ref 0.3–1.2)
Total Protein: 7.4 g/dL (ref 6.5–8.1)

## 2021-11-01 LAB — CBC WITH DIFFERENTIAL/PLATELET
Abs Immature Granulocytes: 0.37 10*3/uL — ABNORMAL HIGH (ref 0.00–0.07)
Basophils Absolute: 0.1 10*3/uL (ref 0.0–0.1)
Basophils Relative: 1 %
Eosinophils Absolute: 0.2 10*3/uL (ref 0.0–0.5)
Eosinophils Relative: 2 %
HCT: 42.6 % (ref 39.0–52.0)
Hemoglobin: 14.2 g/dL (ref 13.0–17.0)
Immature Granulocytes: 3 %
Lymphocytes Relative: 18 %
Lymphs Abs: 2.4 10*3/uL (ref 0.7–4.0)
MCH: 31.1 pg (ref 26.0–34.0)
MCHC: 33.3 g/dL (ref 30.0–36.0)
MCV: 93.4 fL (ref 80.0–100.0)
Monocytes Absolute: 0.7 10*3/uL (ref 0.1–1.0)
Monocytes Relative: 5 %
Neutro Abs: 9.5 10*3/uL — ABNORMAL HIGH (ref 1.7–7.7)
Neutrophils Relative %: 71 %
Platelets: 584 10*3/uL — ABNORMAL HIGH (ref 150–400)
RBC: 4.56 MIL/uL (ref 4.22–5.81)
RDW: 13 % (ref 11.5–15.5)
WBC: 13.3 10*3/uL — ABNORMAL HIGH (ref 4.0–10.5)
nRBC: 0 % (ref 0.0–0.2)

## 2021-11-01 LAB — PHOSPHORUS: Phosphorus: 3.9 mg/dL (ref 2.5–4.6)

## 2021-11-01 LAB — GLUCOSE, CAPILLARY
Glucose-Capillary: 115 mg/dL — ABNORMAL HIGH (ref 70–99)
Glucose-Capillary: 117 mg/dL — ABNORMAL HIGH (ref 70–99)

## 2021-11-01 LAB — HCV RNA QUANT: HCV Quantitative: NOT DETECTED IU/mL (ref >50)

## 2021-11-01 LAB — MAGNESIUM: Magnesium: 2.2 mg/dL (ref 1.7–2.4)

## 2021-11-01 LAB — AMMONIA: Ammonia: 26 umol/L (ref 9–35)

## 2021-11-01 NOTE — Progress Notes (Addendum)
Palliative Medicine Inpatient Follow Up Note  HPI: 56 year old WM PMHx Polysubstance abuse, Hepatitis C, and GERD.   Presented to [facility] in [place] on 9/6 after being found down.  He was last known well at approximately 13:00 hours that day however EMS was called for wellness check at some time later on.  He was found to be down and unresponsive.  On EMS evaluation the patient was apneic with thready pulse.  He did not responded to intranasal Narcan.  He was bagged in route to the emergency department and subsequently intubated on arrival to the ED.  CT head demonstrated cerebral edema without loss of white matter differentiation.  He was transferred to [facility] for MRI.  On 9/11 he was noted to have upper extremity edema on the right.  Ultrasound demonstrated acute thrombosis in the mid and distal basilic vein.  Hospital course also complicated by aspiration pneumonia.  Subsequently transferred to [facility] on 9/13.     Palliative care was asked to get involved to further address goals of care in the setting of an acute stroke.  Today's Discussion 11/01/2021  *Please note that this is a verbal dictation therefore any spelling or grammatical errors are due to the "[employer] Medical One" system interpretation.  Chart reviewed inclusive of vital signs, progress notes, laboratory results, and diagnostic images.   I spoke to patient's daughter, [name] this morning.  We reviewed her father's present state.  We discussed that she would like to be involved in his rehabilitation journey and hopefully speaking to speech therapy, physical therapy, Occupational Therapy regularly to understand his progress.  [name] wants to make it clear that her goals are to continue aggressive year at rehab to see if he may be able to meet functional goals again as well as if he can partake in eating and drinking on his own.  We reviewed the plan to communicate with the therapists and for them to  coordinate a time whereby they will be meeting with Fayrene Fearing so that [name] can be part of that.  I shared that I have endorsed to the medical team the desire for aggressive rehabilitation.  We did discuss that if [place]en should need a gastrostomy tube in the future that would be aligned with the patient's present goals however patient's daughter would like to see how well he could progress prior to that with speech therapy.  Created space and opportunity for patient's daughter to explore thoughts feelings and fears regarding current medical situation.  We also discussed the measures taken to ensure patient safety in terms of only [name] seeing Jayceon and getting information on him.   Goals remain for continued aggressive care.   Questions and concerns addressed/Palliative Support Provided.   Objective Assessment: Vital Signs Vitals:   10/31/21 2344 11/01/21 0358  BP: (!) 110/93 (!) 130/108  Pulse: 98 (!) 103  Resp: 18 20  Temp: 97.7 F (36.5 C) 98.9 F (37.2 C)  SpO2: 98% 99%    Intake/Output Summary (Last 24 hours) at 11/01/2021 0749 Last data filed at 11/01/2021 3244 Gross per 24 hour  Intake --  Output 1300 ml  Net -1300 ml   Last Weight  Most recent update: 11/01/2021  6:18 AM    Weight  73 kg (160 lb 15 oz)            Gen: Older Caucasian male in no acute distress HEENT: Cord track in place, dry mucous membranes CV: Irregular rate and regular rhythm  PULM: On room air breathing is even and nonlabored ABD: soft/nontender  EXT: No edema  Neuro: Alert to self  SUMMARY OF RECOMMENDATIONS   Full code/full scope of care  --> if needed patient's daughter is open to the idea of a gastrostomy tube though she would like patient to be optimized from a speech therapy perspective prior to placement consideration   Patient's daughter would like every opportunity for him to improve inclusive of acute rehabilitation --> discussed with the therapy team to try to coordinate with  patient's daughter in advance so she could be present during evaluations whether that be in person or by FaceTime   On 9/23 The palliative care team has communicated with --> the staff on 3W  the security office, and nursing leadership to further elucidate the concerns about Jameses safety   Of note the only person to receive any information on Talley is his daughter Seaton Hofmann and her partner Theodoro Doing   Palliative care will continue to support Marquie and his family throughout his complex hospitalization  Billing based on MDM: High ______________________________________________________________________________________ [place] Team Team Cell Phone: [phone] Please utilize secure chat with additional questions, if there is no response within 30 minutes please call the above phone number  Palliative Medicine Team providers are available by phone from 7am to 7pm daily and can be reached through the team cell phone.  Should this patient require assistance outside of these hours, please call the patient's attending physician.

## 2021-11-01 NOTE — Progress Notes (Signed)
PROGRESS NOTE    [name]  JKD:[id number] DOB: 1966-01-25 DOA: 10/21/2021 PCP: [name], MD   Brief Narrative:  56 year old WM PMHx Polysubstance abuse, Hepatitis C,    Presented to [facility] in [place] on 9/6 after being found down.  He was last known well at approximately 13:00 hours that day however EMS was called for wellness check at some time later on.  He was found to be down and unresponsive.  On EMS evaluation the patient was apneic with thready pulse.  He did not responded to intranasal Narcan.  He was bagged in route to the emergency department and subsequently intubated on arrival to the ED.  CT head demonstrated cerebral edema without loss of white matter differentiation.  He was transferred to [facility] for MRI.  On 9/11 he was noted to have upper extremity edema on the right.  Ultrasound demonstrated acute thrombosis in the mid and distal basilic vein.  Hospital course also complicated by aspiration pneumonia.  Subsequently transferred to [facility] on 9/13.  Events:  9/6 found down > present to [facility] ED. CT with bilateral cerebellar edema. 9/7 repeat CT normal Unable to be weaned from sedation 9/11 RUE DVT started on tx dose lovenox 9/13 transfer to [facility] for MRI 9/14 chest tube placed with intrapleural fibrinolytic administration 9/16 chest tube removed 9/17 patient extubated, remained agitated and started on precedex 9/19 Cortrak placed     Assessment & Plan:   Principal Problem:   Acute encephalopathy Active Problems:   Fever   Pleural effusion   Deep venous thrombosis (HCC)   Acute respiratory failure with hypoxia (HCC)   Cerebrovascular accident (CVA) ([place])   Protein-calorie malnutrition, severe   Anoxic brain injury ([place])  Acute toxic metabolic encephalopathy in the setting of ischemic stroke: -On high-dose thiamine -In the setting of acute stroke. -LP performed, meningitis panel was negative. -UDS was positive for  cocaine. -EEG-persistently showed no epileptiform activity -Per neurology high suspicion for anoxic brain injury versus toxin induced encephalopathy and ischemic strokes. Neurology recommend continue with high-dose thiamine, no immediate indication for addition of antiplatelets for secondary stroke prevention as patient is on therapeutic Lovenox.  When patient is off of anticoagulation aspirin should be started.   2-Multifocal cerebral infarcts: -Neurology recommended to start aspirin when patient is off of anticoagulation -Currently on Lovenox for treatment of vein thrombosis.   3-Acute hypoxic respiratory failure secondary to aspiration pneumonia Right-sided aspiration pneumonia with parapneumonic effusion: -Status post chest tube placement with intrapleural lytics on 9/14, chest tube removed 9/16. -Completed 7 days of IV Zosyn -Intubated on admission outside hospital 9/6. -Extubated 9/17. -Currently on Room air.    Superficial vein thrombosis Diagnosed 0/99 with right basilic vein thrombus -Due to increase for progression of thrombosis patient was a started on Lovenox after previous hospitalist discussed with oncology Dr. Alen Blew.   Thrombocytosis: We need to add aspirin once off of anticoagulation. Monitor.    Inadequate Oral Intake, in setting stroke, acute illness.  Currently has core track.  IR consulted for PEg tube placement.  Family wants to proceed with that if indicated.   Mild transaminases, history of hepatitis C Mild elevated ammonia at 48---32. Received Lactulose   Diarrhea: Likely due to tube feedings.  It is improving.  DVT prophylaxis:   Lovenox   Code Status: Full Code  Family Communication:  None present at bedside.    Status is: Inpatient Remains inpatient appropriate because: Needs placement.   Estimated body mass index  is 23.09 kg/m as calculated from the following:   Height as of this encounter: 5\' 10"  (1.778 m).   Weight as of this encounter: 73  kg.    Nutritional Assessment: Body mass index is 23.09 kg/m.Marland Kitchen Seen by dietician.  I agree with the assessment and plan as outlined below: Nutrition Status: Nutrition Problem: Severe Malnutrition Etiology: social / environmental circumstances (drug abuse) Signs/Symptoms: severe fat depletion, severe muscle depletion Interventions: Tube feeding  . Skin Assessment: I have examined the patient's skin and I agree with the wound assessment as performed by the wound care RN as outlined below:    Consultants:  Neurology Palliative care  Procedures:  As above  Antimicrobials:  Anti-infectives (From admission, onward)    Start     Dose/Rate Route Frequency Ordered Stop   10/23/21 1000  cefTRIAXone (ROCEPHIN) 2 g in sodium chloride 0.9 % 100 mL IVPB  Status:  Discontinued        2 g 200 mL/hr over 30 Minutes Intravenous Every 24 hours 10/22/21 0036 10/22/21 0037   10/23/21 1000  cefTRIAXone (ROCEPHIN) 2 g in sodium chloride 0.9 % 100 mL IVPB  Status:  Discontinued        2 g 200 mL/hr over 30 Minutes Intravenous Every 24 hours 10/22/21 0037 10/22/21 0827   10/22/21 0930  piperacillin-tazobactam (ZOSYN) IVPB 3.375 g        3.375 g 12.5 mL/hr over 240 Minutes Intravenous Every 8 hours 10/22/21 0833 10/29/21 0559   10/22/21 0915  ceFEPIme (MAXIPIME) 2 g in sodium chloride 0.9 % 100 mL IVPB  Status:  Discontinued        2 g 200 mL/hr over 30 Minutes Intravenous Every 8 hours 10/22/21 0827 10/22/21 0833         Subjective: Patient seen and examined.  Patient awake alert but confused.  Moving bilateral lower extremities spontaneously.  Trying to get out of the bed.  Objective: Vitals:   10/31/21 2011 10/31/21 2344 11/01/21 0358 11/01/21 0500  BP: (!) 128/94 (!) 110/93 (!) 130/108   Pulse: (!) 103 98 (!) 103   Resp: 20 18 20    Temp: 98 F (36.7 C) 97.7 F (36.5 C) 98.9 F (37.2 C)   TempSrc: Oral Oral Oral   SpO2: 98% 98% 99%   Weight:    73 kg  Height:         Intake/Output Summary (Last 24 hours) at 11/01/2021 1330 Last data filed at 11/01/2021 0519 Gross per 24 hour  Intake --  Output 1300 ml  Net -1300 ml   Filed Weights   10/30/21 0500 10/31/21 0500 11/01/21 0500  Weight: 71.8 kg 75.5 kg 73 kg    Examination:  General exam: Appears slightly fidgety. Respiratory system: Clear to auscultation. Respiratory effort normal. Cardiovascular system: S1 & S2 heard, RRR. No JVD, murmurs, rubs, gallops or clicks. No pedal edema. Gastrointestinal system: Abdomen is nondistended, soft and nontender. No organomegaly or masses felt. Normal bowel sounds heard. Central nervous system: Alert but not oriented.  Moving lower extremities spontaneously.   Data Reviewed: I have personally reviewed following labs and imaging studies  CBC: Recent Labs  Lab 10/27/21 0656 10/29/21 0809 10/30/21 0239 10/31/21 1054 11/01/21 0722  WBC 13.6* 11.3* 11.1* 13.5* 13.3*  NEUTROABS  --   --   --  10.0* 9.5*  HGB 13.0 14.2 13.9 14.0 14.2  HCT 41.1 42.0 41.4 41.9 42.6  MCV 101.5* 96.8 94.3 93.9 93.4  PLT 725* 706* 789* 740*  [id number]*   Basic Metabolic Panel: Recent Labs  Lab 10/26/21 0644 10/27/21 0656 10/29/21 0809 10/30/21 0239 10/31/21 1054 11/01/21 0722  NA 144 143 138 138 136 138  K 3.8 4.2 4.0 4.0 4.3 4.1  CL 110 109 105 106 102 103  CO2 23 25 26 22 25 24   GLUCOSE 133* 89 124* 121* 124* 114*  BUN 39* 39* 31* 27* 16 20  CREATININE 0.81 0.87 0.81 0.71 0.73 0.77  CALCIUM 9.3 9.4 9.5 9.5 9.5 9.7  MG 2.4 2.5*  --   --  2.2 2.2  PHOS 3.0 3.9  --   --  3.9 3.9   GFR: Estimated Creatinine Clearance: 105.2 mL/min (by C-G formula based on SCr of 0.77 mg/dL). Liver Function Tests: Recent Labs  Lab 10/30/21 0239 10/31/21 1054 11/01/21 0722  AST 33 34 41  ALT 57* 60* 74*  ALKPHOS 117 118 125  BILITOT 0.6 0.4 0.4  PROT 7.4 7.3 7.4  ALBUMIN 2.8* 2.9* 3.0*   No results for input(s): "LIPASE", "AMYLASE" in the last 168 hours. Recent Labs  Lab  10/31/21 0801 11/01/21 0721  AMMONIA 42* 26   Coagulation Profile: No results for input(s): "INR", "PROTIME" in the last 168 hours. Cardiac Enzymes: Recent Labs  Lab 10/31/21 1054  CKTOTAL 41*  CKMB 1.3   BNP (last 3 results) No results for input(s): "PROBNP" in the last 8760 hours. HbA1C: No results for input(s): "HGBA1C" in the last 72 hours. CBG: Recent Labs  Lab 10/31/21 0743 10/31/21 1606 10/31/21 2014 10/31/21 2343 11/01/21 0359  GLUCAP 142* 109* 119* 122* 115*   Lipid Profile: No results for input(s): "CHOL", "HDL", "LDLCALC", "TRIG", "CHOLHDL", "LDLDIRECT" in the last 72 hours. Thyroid Function Tests: No results for input(s): "TSH", "T4TOTAL", "FREET4", "T3FREE", "THYROIDAB" in the last 72 hours. Anemia Panel: No results for input(s): "VITAMINB12", "FOLATE", "FERRITIN", "TIBC", "IRON", "RETICCTPCT" in the last 72 hours. Sepsis Labs: No results for input(s): "PROCALCITON", "LATICACIDVEN" in the last 168 hours.  Recent Results (from the past 240 hour(s))  CSF culture w Gram Stain     Status: None   Collection Time: 10/23/21 10:31 AM   Specimen: CSF; Cerebrospinal Fluid  Result Value Ref Range Status   Specimen Description CSF  Final   Special Requests NONE  Final   Gram Stain   Final    NO ORGANISMS SEEN RARE WBC PRESENT,BOTH PMN AND MONONUCLEAR    Culture   Final    NO GROWTH 3 DAYS Performed at [facility], [place] [address], [place], [place] [address]    Report Status 10/26/2021 FINAL  Final  Culture, blood (Routine X 2)     Status: None   Collection Time: 10/23/21 11:42 AM   Specimen: BLOOD LEFT ARM  Result Value Ref Range Status   Specimen Description BLOOD LEFT ARM  Final   Special Requests   Final    BOTTLES DRAWN AEROBIC AND ANAEROBIC Blood Culture adequate volume   Culture   Final    NO GROWTH 5 DAYS Performed at [facility], [place] [address] [place], [place] [address]    Report Status 10/28/2021 FINAL  Final  Culture, blood  (Routine X 2)     Status: None   Collection Time: 10/23/21 11:47 AM   Specimen: BLOOD LEFT HAND  Result Value Ref Range Status   Specimen Description BLOOD LEFT HAND  Final   Special Requests   Final    BOTTLES DRAWN AEROBIC AND ANAEROBIC Blood Culture  adequate volume   Culture   Final    NO GROWTH 5 DAYS Performed at [facility], [place] [address] [place], [place] [address]    Report Status 10/28/2021 FINAL  Final  Gastrointestinal Panel by PCR , Stool     Status: None   Collection Time: 10/29/21  5:51 PM   Specimen: Stool  Result Value Ref Range Status   Campylobacter species NOT DETECTED NOT DETECTED Final   Plesimonas shigelloides NOT DETECTED NOT DETECTED Final   Salmonella species NOT DETECTED NOT DETECTED Final   Yersinia enterocolitica NOT DETECTED NOT DETECTED Final   Vibrio species NOT DETECTED NOT DETECTED Final   Vibrio cholerae NOT DETECTED NOT DETECTED Final   Enteroaggregative E coli (EAEC) NOT DETECTED NOT DETECTED Final   Enteropathogenic E coli (EPEC) NOT DETECTED NOT DETECTED Final   Enterotoxigenic E coli (ETEC) NOT DETECTED NOT DETECTED Final   Shiga like toxin producing E coli (STEC) NOT DETECTED NOT DETECTED Final   Shigella/Enteroinvasive E coli (EIEC) NOT DETECTED NOT DETECTED Final   Cryptosporidium NOT DETECTED NOT DETECTED Final   Cyclospora cayetanensis NOT DETECTED NOT DETECTED Final   Entamoeba histolytica NOT DETECTED NOT DETECTED Final   Giardia lamblia NOT DETECTED NOT DETECTED Final   Adenovirus F40/41 NOT DETECTED NOT DETECTED Final   Astrovirus NOT DETECTED NOT DETECTED Final   Norovirus GI/GII NOT DETECTED NOT DETECTED Final   Rotavirus A NOT DETECTED NOT DETECTED Final   Sapovirus (I, II, IV, and V) NOT DETECTED NOT DETECTED Final    Comment: Performed at [facility], [address], [name], [place] [address]     Radiology Studies: No results found.  Scheduled Meds:  Chlorhexidine Gluconate Cloth  6 each Topical Daily    enoxaparin (LOVENOX) injection  70 mg Subcutaneous Q12H   feeding supplement (PROSource TF20)  60 mL Per Tube Daily   fiber supplement (BANATROL TF)  60 mL Per Tube BID   free water  150 mL Per Tube Q4H   loperamide HCl  4 mg Per Tube TID   melatonin  5 mg Per Tube QHS   mouth rinse  15 mL Mouth Rinse 4 times per day   pantoprazole  40 mg Per Tube Daily   thiamine (VITAMIN B1) injection  100 mg Intravenous Daily   Continuous Infusions:  feeding supplement (JEVITY 1.5 CAL/FIBER) 1,000 mL (11/01/21 0940)     LOS: 11 days   Darliss Cheney, MD [facility]  11/01/2021, 1:30 PM   *Please note that this is a verbal dictation therefore any spelling or grammatical errors are due to the "[place] One" system interpretation.  Please page via [place] and do not message via secure chat for urgent patient care matters. Secure chat can be used for non urgent patient care matters.  How to contact the [facility] Attending or Consulting provider [place] or covering provider during after hours [place], for this patient?  Check the care team in [facility] and look for a) attending/consulting TRH provider listed and b) the [name] team listed. Page or secure chat [phone]. Log into [email or web address] and use [place]'s universal password to access. If you do not have the password, please contact the hospital operator. Locate the [facility] provider you are looking for under [facility] and page to a number that you can be directly reached. If you still have difficulty reaching the provider, please page the [facility] (Director on Call) for the Hospitalists listed on amion for assistance.

## 2021-11-02 DIAGNOSIS — G9340 Encephalopathy, unspecified: Secondary | ICD-10-CM | POA: Diagnosis not present

## 2021-11-02 DIAGNOSIS — E43 Unspecified severe protein-calorie malnutrition: Secondary | ICD-10-CM

## 2021-11-02 DIAGNOSIS — J9601 Acute respiratory failure with hypoxia: Secondary | ICD-10-CM | POA: Diagnosis not present

## 2021-11-02 DIAGNOSIS — G931 Anoxic brain damage, not elsewhere classified: Secondary | ICD-10-CM | POA: Diagnosis not present

## 2021-11-02 LAB — COMPREHENSIVE METABOLIC PANEL
ALT: 76 U/L — ABNORMAL HIGH (ref 0–44)
AST: 39 U/L (ref 15–41)
Albumin: 3 g/dL — ABNORMAL LOW (ref 3.5–5.0)
Alkaline Phosphatase: 123 U/L (ref 38–126)
Anion gap: 12 (ref 5–15)
BUN: 21 mg/dL — ABNORMAL HIGH (ref 6–20)
CO2: 24 mmol/L (ref 22–32)
Calcium: 9.7 mg/dL (ref 8.9–10.3)
Chloride: 102 mmol/L (ref 98–111)
Creatinine, Ser: 0.72 mg/dL (ref 0.61–1.24)
GFR, Estimated: >60 mL/min (ref >60)
Glucose, Bld: 132 mg/dL — ABNORMAL HIGH (ref 70–99)
Potassium: 4.1 mmol/L (ref 3.5–5.1)
Sodium: 138 mmol/L (ref 135–145)
Total Bilirubin: 0.4 mg/dL (ref 0.3–1.2)
Total Protein: 7.2 g/dL (ref 6.5–8.1)

## 2021-11-02 LAB — CBC WITH DIFFERENTIAL/PLATELET
Abs Immature Granulocytes: 0.26 10*3/uL — ABNORMAL HIGH (ref 0.00–0.07)
Basophils Absolute: 0.1 10*3/uL (ref 0.0–0.1)
Basophils Relative: 1 %
Eosinophils Absolute: 0.2 10*3/uL (ref 0.0–0.5)
Eosinophils Relative: 2 %
HCT: 41.7 % (ref 39.0–52.0)
Hemoglobin: 14.2 g/dL (ref 13.0–17.0)
Immature Granulocytes: 2 %
Lymphocytes Relative: 18 %
Lymphs Abs: 2.1 10*3/uL (ref 0.7–4.0)
MCH: 32.2 pg (ref 26.0–34.0)
MCHC: 34.1 g/dL (ref 30.0–36.0)
MCV: 94.6 fL (ref 80.0–100.0)
Monocytes Absolute: 0.6 10*3/uL (ref 0.1–1.0)
Monocytes Relative: 5 %
Neutro Abs: 8.6 10*3/uL — ABNORMAL HIGH (ref 1.7–7.7)
Neutrophils Relative %: 72 %
Platelets: 612 10*3/uL — ABNORMAL HIGH (ref 150–400)
RBC: 4.41 MIL/uL (ref 4.22–5.81)
RDW: 12.8 % (ref 11.5–15.5)
WBC: 11.8 10*3/uL — ABNORMAL HIGH (ref 4.0–10.5)
nRBC: 0 % (ref 0.0–0.2)

## 2021-11-02 LAB — GLUCOSE, CAPILLARY
Glucose-Capillary: 113 mg/dL — ABNORMAL HIGH (ref 70–99)
Glucose-Capillary: 113 mg/dL — ABNORMAL HIGH (ref 70–99)
Glucose-Capillary: 121 mg/dL — ABNORMAL HIGH (ref 70–99)
Glucose-Capillary: 125 mg/dL — ABNORMAL HIGH (ref 70–99)
Glucose-Capillary: 133 mg/dL — ABNORMAL HIGH (ref 70–99)
Glucose-Capillary: 146 mg/dL — ABNORMAL HIGH (ref 70–99)

## 2021-11-02 LAB — PHOSPHORUS: Phosphorus: 4.2 mg/dL (ref 2.5–4.6)

## 2021-11-02 LAB — AMMONIA: Ammonia: 28 umol/L (ref 9–35)

## 2021-11-02 LAB — MAGNESIUM: Magnesium: 2.4 mg/dL (ref 1.7–2.4)

## 2021-11-02 NOTE — Progress Notes (Signed)
Palliative Medicine Inpatient Follow Up Note  HPI: 56 year old WM PMHx Polysubstance abuse, Hepatitis C, and GERD.   Presented to [facility] in [place] on 9/6 after being found down.  He was last known well at approximately 13:00 hours that day however EMS was called for wellness check at some time later on.  He was found to be down and unresponsive.  On EMS evaluation the patient was apneic with thready pulse.  He did not responded to intranasal Narcan.  He was bagged in route to the emergency department and subsequently intubated on arrival to the ED.  CT head demonstrated cerebral edema without loss of white matter differentiation.  He was transferred to [facility] for MRI.  On 9/11 he was noted to have upper extremity edema on the right.  Ultrasound demonstrated acute thrombosis in the mid and distal basilic vein.  Hospital course also complicated by aspiration pneumonia.  Subsequently transferred to [facility] on 9/13.     Palliative care was asked to get involved to further address goals of care in the setting of an acute stroke.  Today's Discussion 11/02/2021  *Please note that this is a verbal dictation therefore any spelling or grammatical errors are due to the "[place] One" system interpretation.  Chart reviewed inclusive of vital signs, progress notes, laboratory results, and diagnostic images.  Patient assessed at the bedside and remains restless, not conversing.  No family present during my visit.  Discussed with RN.  I then called patient's daughter [name] for ongoing goals of care conversations and palliative support.  We discussed her concerns regarding patient's safety/privacy and ongoing therapy efforts.  She remains very interested in CIR and feels this is the most appropriate given patient and family's goals to help him feed and care for himself again.  She plans to visit at the bedside tomorrow morning and hopefully she can receive updates from SLP,  PT and OT as discussed with my colleague Tacey Ruiz NP.  I will pass that along today as well.  [name] is waiting for a return call from nursing leadership and I offered to advocate for this.  She is appreciative.  No other needs or concerns today.  Objective Assessment: Vital Signs Vitals:   11/01/21 2324 11/02/21 0341  BP: 128/88 (!) 142/95  Pulse:    Resp: 18 18  Temp: 98.1 F (36.7 C) 98 F (36.7 C)  SpO2: 98% 98%    Intake/Output Summary (Last 24 hours) at 11/02/2021 1401 Last data filed at 11/01/2021 2052 Gross per 24 hour  Intake --  Output 900 ml  Net -900 ml    Last Weight  Most recent update: 11/02/2021  5:25 AM    Weight  69.8 kg (153 lb 14.1 oz)            Gen: Older Caucasian male in no acute distress HEENT: Cord track in place, dry mucous membranes CV: Irregular rate and regular rhythm  PULM: On room air breathing is even and nonlabored ABD: soft/nontender  EXT: No edema  Neuro: Alert Psych: Mildly agitated  SUMMARY OF RECOMMENDATIONS   -Continue full code/full scope of care  --> if needed patient's daughter is open to the idea of a gastrostomy tube though she would like patient to be optimized from a speech therapy perspective prior to placement consideration   -Patient's daughter would like every opportunity for him to improve inclusive of acute inpatient rehabilitation --> discussed with SLP/OT via secure chat today.  She  will be present tomorrow morning in person and it would be beneficial to coordinate therapy while she is at the bedside for support and education if possible   -On 9/25 The palliative care team has communicated persistent safety concerns with --> the staff on 3W as well as provided daughter [name] with phone number for compliance   Of note the only person to receive any information on [name] is his daughter [name] and her partner [name]   Palliative care will continue to support [name] and his family throughout his  complex hospitalization  Billing based on MDM: High ______________________________________________________________________________________ Clifford Cooler, PA-C [place] Team Team Cell Phone: [phone] Please utilize secure chat with additional questions, if there is no response within 30 minutes please call the above phone number  Palliative Medicine Team providers are available by phone from 7am to 7pm daily and can be reached through the team cell phone.  Should this patient require assistance outside of these hours, please call the patient's attending physician.

## 2021-11-02 NOTE — Progress Notes (Signed)
Physical Therapy Treatment Patient Details Name: [name] MRN: [id number] DOB: 1965-07-15 Today's Date: 11/02/2021   History of Present Illness Pt is 56 yo male admitted on to [name] health in [place] on 9/6 after being found unresponsive.  Pt transferred to [facility] on 10/21/21.  Pt with acute toxic metabolic encephalopathy vs encephalitis in pt with acute ischemic strokes per neurology.  9/11 R UE DVT started on lovenox, 9/14-9/16 chest tube, ETT 9/6-9/18.  Hx of polysubstance abuse, hep C, and GERD.    PT Comments    Pt maintained eyes open this session and tracked eyes to L and R to name however wasn't able to focus on task at hand despite max verbal cues, minimal simple command following, decreased insight to safety and deficits with little initiation of task. Pt requiring mod/maxAx2 for safe transfers. Pt currently requiring posey belt in bed limiting pt from OOB to chair due to severely impaired cognition, pt being non-verbal to express needs, pt's restlessness and high risk of falling. Continue to recommend SNF upon d/c. Acute PT to cont to follow.    Recommendations for follow up therapy are one component of a multi-disciplinary discharge planning process, led by the attending physician.  Recommendations may be updated based on patient status, additional functional criteria and insurance authorization.  Follow Up Recommendations  Skilled nursing-short term rehab (<3 hours/day) Can patient physically be transported by private vehicle: No   Assistance Recommended at Discharge Frequent or constant Supervision/Assistance  Patient can return home with the following Two people to help with walking and/or transfers;Two people to help with bathing/dressing/bathroom;Assistance with cooking/housework;Assistance with feeding;Help with stairs or ramp for entrance   Equipment Recommendations  Other (comment) (TBD)    Recommendations for Other Services       Precautions / Restrictions  Precautions Precautions: Fall Precaution Comments: posey belt Restrictions Weight Bearing Restrictions: No     Mobility  Bed Mobility Overal bed mobility: Needs Assistance Bed Mobility: Sit to Supine, Rolling, Sidelying to Sit Rolling: Mod assist, +2 for physical assistance, +2 for safety/equipment Sidelying to sit: Mod assist, +2 for physical assistance   Sit to supine: Mod assist, +2 for physical assistance, +2 for safety/equipment   General bed mobility comments: modA with max directional verbal cues to roll, PT tech to maintain LEs off EOB as pt kept bringing them back up on bed, modA for trunk elevation, pt impulsively returned to supine, modA for safety to direct trunk to prevent from hitting railing and to manage LEs safetly back into bed    Transfers Overall transfer level: Needs assistance Equipment used: 2 person hand held assist Transfers: Sit to/from Stand Sit to Stand: Max assist, +2 physical assistance, Total assist           General transfer comment: attempted to stand x2, pt with good initial power up however unable to maintain, pt with significant posterior lean and then impulsively try to sit down    Ambulation/Gait               General Gait Details: unable to at this time   Stairs             Wheelchair Mobility    Modified Rankin (Stroke Patients Only) Modified Rankin (Stroke Patients Only) Pre-Morbid Rankin Score: No symptoms Modified Rankin: Severe disability     Balance Overall balance assessment: Needs assistance Sitting-balance support: Feet supported, Bilateral upper extremity supported Sitting balance-Leahy Scale: Poor Sitting balance - Comments: modA to maintain EOB balance, pt initially  with posterior lean Postural control: Posterior lean Standing balance support: No upper extremity supported Standing balance-Leahy Scale: Zero Standing balance comment: dependent on therapists                             Cognition Arousal/Alertness: Awake/alert Behavior During Therapy: Restless Overall Cognitive Status: Impaired/Different from baseline Area of Impairment: Following commands, Problem solving, Attention, Safety/judgement, Awareness                   Current Attention Level: Focused   Following Commands: Follows one step commands inconsistently, Follows one step commands with increased time (pt follows functional simple commands ie. roll towards me, stand up but not other simple commands ie. give me a thumbs up, show me 2 fingers) Safety/Judgement: Decreased awareness of safety, Decreased awareness of deficits (pt in posey belt) Awareness:  (below intellectual) Problem Solving: Slow processing, Requires verbal cues, Requires tactile cues, Decreased initiation, Difficulty sequencing General Comments: pt did mumble "what" when "La[place] Peace" was stated by PT, pt with generalized R gaze, could track to the L but wouldn't stay there, would return to midline or R side        Exercises      General Comments General comments (skin integrity, edema, etc.): VSS      Pertinent Vitals/Pain Pain Assessment Pain Assessment: Faces Faces Pain Scale: No hurt    Home Living                          Prior Function            PT Goals (current goals can now be found in the care plan section) Acute Rehab PT Goals PT Goal Formulation: Patient unable to participate in goal setting Time For Goal Achievement: 11/10/21 Potential to Achieve Goals: Poor Progress towards PT goals: Progressing toward goals    Frequency    Min 2X/week      PT Plan Current plan remains appropriate    Co-evaluation              AM-PAC PT "6 Clicks" Mobility   Outcome Measure  Help needed turning from your back to your side while in a flat bed without using bedrails?: A Lot Help needed moving from lying on your back to sitting on the side of a flat bed without using bedrails?: A Lot Help  needed moving to and from a bed to a chair (including a wheelchair)?: A Lot Help needed standing up from a chair using your arms (e.g., wheelchair or bedside chair)?: Total Help needed to walk in hospital room?: Total Help needed climbing 3-5 steps with a railing? : Total 6 Click Score: 9    End of Session Equipment Utilized During Treatment: Gait belt Activity Tolerance: Other (comment) (limited by cognition) Patient left: in bed;with call bell/phone within reach;with bed alarm set;with restraints reapplied (posey belt reapplied) Nurse Communication: Mobility status PT Visit Diagnosis: Other abnormalities of gait and mobility (R26.89);Muscle weakness (generalized) (M62.81)     Time: 2836-6294 PT Time Calculation (min) (ACUTE ONLY): 16 min  Charges:  $Therapeutic Activity: 8-22 mins                     Kittie Plater, PT, DPT Acute Rehabilitation Services Secure chat preferred Office #: [phone]    Berline Lopes 11/02/2021, 1:41 PM

## 2021-11-02 NOTE — Progress Notes (Signed)
PROGRESS NOTE    [name]  MHD:[id number] DOB: Jul 27, 1965 DOA: 10/21/2021 PCP: [name], MD   Brief Narrative:  56 year old WM PMHx Polysubstance abuse, Hepatitis C,    Presented to [facility] in [place] on 9/6 after being found down.  He was last known well at approximately 13:00 hours that day however EMS was called for wellness check at some time later on.  He was found to be down and unresponsive.  On EMS evaluation the patient was apneic with thready pulse.  He did not responded to intranasal Narcan.  He was bagged in route to the emergency department and subsequently intubated on arrival to the ED.  CT head demonstrated cerebral edema without loss of white matter differentiation.  He was transferred to [facility] for MRI.  On 9/11 he was noted to have upper extremity edema on the right.  Ultrasound demonstrated acute thrombosis in the mid and distal basilic vein.  Hospital course also complicated by aspiration pneumonia.  Subsequently transferred to [facility] on 9/13.  Events:  9/6 found down > present to [facility] ED. CT with bilateral cerebellar edema. 9/7 repeat CT normal Unable to be weaned from sedation 9/11 RUE DVT started on tx dose lovenox 9/13 transfer to [name] for MRI 9/14 chest tube placed with intrapleural fibrinolytic administration 9/16 chest tube removed 9/17 patient extubated, remained agitated and started on precedex 9/19 Cortrak placed     Assessment & Plan:   Principal Problem:   Acute encephalopathy Active Problems:   Fever   Pleural effusion   Deep venous thrombosis (HCC)   Acute respiratory failure with hypoxia (HCC)   Cerebrovascular accident (CVA) (HCC)   Protein-calorie malnutrition, severe   Anoxic brain injury (HCC)  Acute toxic metabolic encephalopathy in the setting of ischemic stroke: -On high-dose thiamine -In the setting of acute stroke. -LP performed, meningitis panel was negative. -UDS was positive for  cocaine. -EEG-persistently showed no epileptiform activity -Per neurology high suspicion for anoxic brain injury versus toxin induced encephalopathy and ischemic strokes. Neurology recommend continue with thiamine, no immediate indication for addition of antiplatelets for secondary stroke prevention as patient is on therapeutic Lovenox.  When patient is off of anticoagulation, aspirin should be started.   2-Multifocal cerebral infarcts: -Neurology recommended to start aspirin when patient is off of anticoagulation -Currently on Lovenox for treatment of vein thrombosis.   3-Acute hypoxic respiratory failure secondary to aspiration pneumonia Right-sided aspiration pneumonia with parapneumonic effusion: -Status post chest tube placement with intrapleural lytics on 9/14, chest tube removed 9/16. -Completed 7 days of IV Zosyn -Intubated on admission outside hospital 9/6. -Extubated 9/17. -Currently on Room air.    Superficial vein thrombosis Diagnosed 9/11 with right basilic vein thrombus -Due to increase for progression of thrombosis patient was started on Lovenox after previous hospitalist discussed with oncology Dr. Clelia Croft.   Thrombocytosis: We need to add aspirin once off of anticoagulation. Monitor.    Inadequate Oral Intake, in setting stroke, acute illness.  Currently has core track.  IR consulted for PEg tube placement.  Family wants to proceed with that if indicated.   Mild transaminases, history of hepatitis C Mild elevated ammonia at 48---32. Received Lactulose   Diarrhea: Likely due to tube feedings.  It is improving.  DVT prophylaxis:   Lovenox   Code Status: Full Code  Family Communication:  None present at bedside.    Status is: Inpatient Remains inpatient appropriate because: Needs placement.   Estimated body mass index is 22.08  kg/m as calculated from the following:   Height as of this encounter: 5\' 10"  (1.778 m).   Weight as of this encounter: 69.8 kg.     Nutritional Assessment: Body mass index is 22.08 kg/m. Seen by dietician.  I agree with the assessment and plan as outlined below: Nutrition Status: Nutrition Problem: Severe Malnutrition Etiology: social / environmental circumstances (drug abuse) Signs/Symptoms: severe fat depletion, severe muscle depletion Interventions: Tube feeding  . Skin Assessment: I have examined the patient's skin and I agree with the wound assessment as performed by the wound care RN as outlined below:    Consultants:  Neurology Palliative care  Procedures:  As above  Antimicrobials:  Anti-infectives (From admission, onward)    Start     Dose/Rate Route Frequency Ordered Stop   10/23/21 1000  cefTRIAXone (ROCEPHIN) 2 g in sodium chloride 0.9 % 100 mL IVPB  Status:  Discontinued        2 g 200 mL/hr over 30 Minutes Intravenous Every 24 hours 10/22/21 0036 10/22/21 0037   10/23/21 1000  cefTRIAXone (ROCEPHIN) 2 g in sodium chloride 0.9 % 100 mL IVPB  Status:  Discontinued        2 g 200 mL/hr over 30 Minutes Intravenous Every 24 hours 10/22/21 0037 10/22/21 0827   10/22/21 0930  piperacillin-tazobactam (ZOSYN) IVPB 3.375 g        3.375 g 12.5 mL/hr over 240 Minutes Intravenous Every 8 hours 10/22/21 0833 10/29/21 0559   10/22/21 0915  ceFEPIme (MAXIPIME) 2 g in sodium chloride 0.9 % 100 mL IVPB  Status:  Discontinued        2 g 200 mL/hr over 30 Minutes Intravenous Every 8 hours 10/22/21 0827 10/22/21 0833         Subjective:  Patient seen and examined.  Status quo.  Alert but not oriented.  Moving bilateral lower extremities spontaneously and trying to get out of the bed.  Objective: Vitals:   11/01/21 2029 11/01/21 2324 11/02/21 0341 11/02/21 0500  BP: (!) 154/90 128/88 (!) 142/95   Pulse:      Resp: 10 18 18    Temp:  98.1 F (36.7 C) 98 F (36.7 C)   TempSrc: Axillary Axillary Axillary   SpO2: 97% 98% 98%   Weight:    69.8 kg  Height:        Intake/Output Summary (Last 24  hours) at 11/02/2021 1149 Last data filed at 11/01/2021 2052 Gross per 24 hour  Intake --  Output 900 ml  Net -900 ml    Filed Weights   10/31/21 0500 11/01/21 0500 11/02/21 0500  Weight: 75.5 kg 73 kg 69.8 kg    Examination:  General exam: Appears a little calmer today. Respiratory system: Clear to auscultation. Respiratory effort normal. Cardiovascular system: S1 & S2 heard, RRR. No JVD, murmurs, rubs, gallops or clicks. No pedal edema. Gastrointestinal system: Abdomen is nondistended, soft and nontender. No organomegaly or masses felt. Normal bowel sounds heard. Central nervous system: Alert but not oriented.  Data Reviewed: I have personally reviewed following labs and imaging studies  CBC: Recent Labs  Lab 10/29/21 0809 10/30/21 0239 10/31/21 1054 11/01/21 0722 11/02/21 0823  WBC 11.3* 11.1* 13.5* 13.3* 11.8*  NEUTROABS  --   --  10.0* 9.5* 8.6*  HGB 14.2 13.9 14.0 14.2 14.2  HCT 42.0 41.4 41.9 42.6 41.7  MCV 96.8 94.3 93.9 93.4 94.6  PLT 706* 789* 740* 584* 612*    Basic Metabolic Panel: Recent Labs  Lab 10/27/21 0656 10/29/21 0809 10/30/21 0239 10/31/21 1054 11/01/21 0722 11/02/21 0823  NA 143 138 138 136 138 138  K 4.2 4.0 4.0 4.3 4.1 4.1  CL 109 105 106 102 103 102  CO2 25 26 22 25 24 24   GLUCOSE 89 124* 121* 124* 114* 132*  BUN 39* 31* 27* 16 20 21*  CREATININE 0.87 0.81 0.71 0.73 0.77 0.72  CALCIUM 9.4 9.5 9.5 9.5 9.7 9.7  MG 2.5*  --   --  2.2 2.2 2.4  PHOS 3.9  --   --  3.9 3.9 4.2    GFR: Estimated Creatinine Clearance: 100.6 mL/min (by C-G formula based on SCr of 0.72 mg/dL). Liver Function Tests: Recent Labs  Lab 10/30/21 0239 10/31/21 1054 11/01/21 0722 11/02/21 0823  AST 33 34 41 39  ALT 57* 60* 74* 76*  ALKPHOS 117 118 125 123  BILITOT 0.6 0.4 0.4 0.4  PROT 7.4 7.3 7.4 7.2  ALBUMIN 2.8* 2.9* 3.0* 3.0*    No results for input(s): "LIPASE", "AMYLASE" in the last 168 hours. Recent Labs  Lab 10/31/21 0801 11/01/21 0721  11/02/21 0823  AMMONIA 42* 26 28    Coagulation Profile: No results for input(s): "INR", "PROTIME" in the last 168 hours. Cardiac Enzymes: Recent Labs  Lab 10/31/21 1054  CKTOTAL 41*  CKMB 1.3    BNP (last 3 results) No results for input(s): "PROBNP" in the last 8760 hours. HbA1C: No results for input(s): "HGBA1C" in the last 72 hours. CBG: Recent Labs  Lab 11/01/21 0359 11/01/21 1919 11/02/21 0022 11/02/21 0306 11/02/21 0856  GLUCAP 115* 117* 113* 121* 133*    Lipid Profile: No results for input(s): "CHOL", "HDL", "LDLCALC", "TRIG", "CHOLHDL", "LDLDIRECT" in the last 72 hours. Thyroid Function Tests: No results for input(s): "TSH", "T4TOTAL", "FREET4", "T3FREE", "THYROIDAB" in the last 72 hours. Anemia Panel: No results for input(s): "VITAMINB12", "FOLATE", "FERRITIN", "TIBC", "IRON", "RETICCTPCT" in the last 72 hours. Sepsis Labs: No results for input(s): "PROCALCITON", "LATICACIDVEN" in the last 168 hours.  Recent Results (from the past 240 hour(s))  Gastrointestinal Panel by PCR , Stool     Status: None   Collection Time: 10/29/21  5:51 PM   Specimen: Stool  Result Value Ref Range Status   Campylobacter species NOT DETECTED NOT DETECTED Final   Plesimonas shigelloides NOT DETECTED NOT DETECTED Final   Salmonella species NOT DETECTED NOT DETECTED Final   Yersinia enterocolitica NOT DETECTED NOT DETECTED Final   Vibrio species NOT DETECTED NOT DETECTED Final   Vibrio cholerae NOT DETECTED NOT DETECTED Final   Enteroaggregative E coli (EAEC) NOT DETECTED NOT DETECTED Final   Enteropathogenic E coli (EPEC) NOT DETECTED NOT DETECTED Final   Enterotoxigenic E coli (ETEC) NOT DETECTED NOT DETECTED Final   Shiga like toxin producing E coli (STEC) NOT DETECTED NOT DETECTED Final   Shigella/Enteroinvasive E coli (EIEC) NOT DETECTED NOT DETECTED Final   Cryptosporidium NOT DETECTED NOT DETECTED Final   Cyclospora cayetanensis NOT DETECTED NOT DETECTED Final    Entamoeba histolytica NOT DETECTED NOT DETECTED Final   Giardia lamblia NOT DETECTED NOT DETECTED Final   Adenovirus F40/41 NOT DETECTED NOT DETECTED Final   Astrovirus NOT DETECTED NOT DETECTED Final   Norovirus GI/GII NOT DETECTED NOT DETECTED Final   Rotavirus A NOT DETECTED NOT DETECTED Final   Sapovirus (I, II, IV, and V) NOT DETECTED NOT DETECTED Final    Comment: Performed at [facility], [address], [place], [place]  Radiology Studies: No results found.  Scheduled Meds:  enoxaparin (LOVENOX) injection  70 mg Subcutaneous Q12H   feeding supplement (PROSource TF20)  60 mL Per Tube Daily   fiber supplement (BANATROL TF)  60 mL Per Tube BID   free water  150 mL Per Tube Q4H   melatonin  5 mg Per Tube QHS   mouth rinse  15 mL Mouth Rinse 4 times per day   pantoprazole  40 mg Per Tube Daily   thiamine (VITAMIN B1) injection  100 mg Intravenous Daily   Continuous Infusions:  feeding supplement (JEVITY 1.5 CAL/FIBER) 1,000 mL (11/01/21 0940)     LOS: 12 days   Darliss Cheney, MD [facility]  11/02/2021, 11:49 AM   *Please note that this is a verbal dictation therefore any spelling or grammatical errors are due to the "[employer] [employer]" system interpretation.  Please page via [place] and do not message via secure chat for urgent patient care matters. Secure chat can be used for non urgent patient care matters.  How to contact the [facility] Attending or Consulting provider [place] or covering provider during after hours [place], for this patient?  Check the care team in [facility] and look for a) attending/consulting [facility] provider listed and b) the [facility] team listed. Page or secure chat [phone]. Log into [email or web address] and use [place]'s universal password to access. If you do not have the password, please contact the hospital operator. Locate the [facility] provider you are looking for under [facility] and page to a number that you can be directly reached. If  you still have difficulty reaching the provider, please page the [facility] (Director on Call) for the Hospitalists listed on amion for assistance.

## 2021-11-03 ENCOUNTER — Inpatient Hospital Stay (HOSPITAL_COMMUNITY): Payer: Medicaid Other

## 2021-11-03 DIAGNOSIS — G9340 Encephalopathy, unspecified: Secondary | ICD-10-CM | POA: Diagnosis not present

## 2021-11-03 LAB — COMPREHENSIVE METABOLIC PANEL
ALT: 73 U/L — ABNORMAL HIGH (ref 0–44)
AST: 37 U/L (ref 15–41)
Albumin: 3 g/dL — ABNORMAL LOW (ref 3.5–5.0)
Alkaline Phosphatase: 113 U/L (ref 38–126)
Anion gap: 8 (ref 5–15)
BUN: 22 mg/dL — ABNORMAL HIGH (ref 6–20)
CO2: 24 mmol/L (ref 22–32)
Calcium: 9.2 mg/dL (ref 8.9–10.3)
Chloride: 103 mmol/L (ref 98–111)
Creatinine, Ser: 0.65 mg/dL (ref 0.61–1.24)
GFR, Estimated: >60 mL/min (ref >60)
Glucose, Bld: 120 mg/dL — ABNORMAL HIGH (ref 70–99)
Potassium: 4.4 mmol/L (ref 3.5–5.1)
Sodium: 135 mmol/L (ref 135–145)
Total Bilirubin: 0.4 mg/dL (ref 0.3–1.2)
Total Protein: 7.1 g/dL (ref 6.5–8.1)

## 2021-11-03 LAB — CBC WITH DIFFERENTIAL/PLATELET
Abs Immature Granulocytes: 0.29 10*3/uL — ABNORMAL HIGH (ref 0.00–0.07)
Basophils Absolute: 0.2 10*3/uL — ABNORMAL HIGH (ref 0.0–0.1)
Basophils Relative: 1 %
Eosinophils Absolute: 0.2 10*3/uL (ref 0.0–0.5)
Eosinophils Relative: 2 %
HCT: 41.7 % (ref 39.0–52.0)
Hemoglobin: 13.8 g/dL (ref 13.0–17.0)
Immature Granulocytes: 2 %
Lymphocytes Relative: 21 %
Lymphs Abs: 2.5 10*3/uL (ref 0.7–4.0)
MCH: 31.7 pg (ref 26.0–34.0)
MCHC: 33.1 g/dL (ref 30.0–36.0)
MCV: 95.9 fL (ref 80.0–100.0)
Monocytes Absolute: 0.8 10*3/uL (ref 0.1–1.0)
Monocytes Relative: 7 %
Neutro Abs: 8 10*3/uL — ABNORMAL HIGH (ref 1.7–7.7)
Neutrophils Relative %: 67 %
Platelets: 574 10*3/uL — ABNORMAL HIGH (ref 150–400)
RBC: 4.35 MIL/uL (ref 4.22–5.81)
RDW: 12.8 % (ref 11.5–15.5)
WBC: 11.9 10*3/uL — ABNORMAL HIGH (ref 4.0–10.5)
nRBC: 0 % (ref 0.0–0.2)

## 2021-11-03 LAB — AMMONIA: Ammonia: 42 umol/L — ABNORMAL HIGH (ref 9–35)

## 2021-11-03 LAB — GLUCOSE, CAPILLARY
Glucose-Capillary: 110 mg/dL — ABNORMAL HIGH (ref 70–99)
Glucose-Capillary: 112 mg/dL — ABNORMAL HIGH (ref 70–99)
Glucose-Capillary: 121 mg/dL — ABNORMAL HIGH (ref 70–99)
Glucose-Capillary: 124 mg/dL — ABNORMAL HIGH (ref 70–99)
Glucose-Capillary: 142 mg/dL — ABNORMAL HIGH (ref 70–99)

## 2021-11-03 LAB — MAGNESIUM: Magnesium: 2.2 mg/dL (ref 1.7–2.4)

## 2021-11-03 LAB — PHOSPHORUS: Phosphorus: 4 mg/dL (ref 2.5–4.6)

## 2021-11-03 MED ORDER — AMLODIPINE BESYLATE 5 MG PO TABS
5.0000 mg | ORAL_TABLET | Freq: Every day | ORAL | Status: DC
  Administered 2021-11-03 – 2021-11-30 (×28): 5 mg via ORAL
  Filled 2021-11-03 (×28): qty 1

## 2021-11-03 NOTE — Progress Notes (Signed)
PROGRESS NOTE    Jennifer Holland  EPP:[id number] DOB: March 19, 1965 DOA: 10/21/2021 PCP: [name], MD   Brief Narrative:  56 year old WM PMHx Polysubstance abuse, Hepatitis C,    Presented to [facility] in [place] on 9/6 after being found down.  He was last known well at approximately 13:00 hours that day however EMS was called for wellness check at some time later on.  He was found to be down and unresponsive.  On EMS evaluation the patient was apneic with thready pulse.  He did not responded to intranasal Narcan.  He was bagged in route to the emergency department and subsequently intubated on arrival to the ED.  CT head demonstrated cerebral edema without loss of white matter differentiation.  He was transferred to [facility] for MRI.  On 9/11 he was noted to have upper extremity edema on the right.  Ultrasound demonstrated acute thrombosis in the mid and distal basilic vein.  Hospital course also complicated by aspiration pneumonia.  Subsequently transferred to [facility] on 9/13.  Events:  9/6 found down > present to [facility] ED. CT with bilateral cerebellar edema. 9/7 repeat CT normal Unable to be weaned from sedation 9/11 RUE DVT started on tx dose lovenox 9/13 transfer to [facility] for MRI 9/14 chest tube placed with intrapleural fibrinolytic administration 9/16 chest tube removed 9/17 patient extubated, remained agitated and started on precedex 9/19 Cortrak placed     Assessment & Plan:   Principal Problem:   Acute encephalopathy Active Problems:   Fever   Pleural effusion   Deep venous thrombosis (HCC)   Acute respiratory failure with hypoxia (HCC)   Cerebrovascular accident (CVA) ([place])   Protein-calorie malnutrition, severe   Anoxic brain injury ([place])  Acute toxic metabolic encephalopathy in the setting of ischemic stroke: -On high-dose thiamine -In the setting of acute stroke. -LP performed, meningitis panel was negative. -UDS was positive for  cocaine. -EEG-persistently showed no epileptiform activity -Per neurology high suspicion for anoxic brain injury versus toxin induced encephalopathy and ischemic strokes. Neurology recommend continue with thiamine, no immediate indication for addition of antiplatelets for secondary stroke prevention as patient is on therapeutic Lovenox.  When patient is off of anticoagulation, aspirin should be started.   2-Multifocal cerebral infarcts: -Neurology recommended to start aspirin when patient is off of anticoagulation -Currently on Lovenox for treatment of vein thrombosis.   3-Acute hypoxic respiratory failure secondary to aspiration pneumonia Right-sided aspiration pneumonia with parapneumonic effusion: -Status post chest tube placement with intrapleural lytics on 9/14, chest tube removed 9/16. -Completed 7 days of IV Zosyn -Intubated on admission outside hospital 9/6. -Extubated 9/17. -Currently on Room air.    Superficial vein thrombosis Diagnosed 6/06 with right basilic vein thrombus -Due to increase for progression of thrombosis patient was started on Lovenox after previous hospitalist discussed with oncology Dr. Alen Blew.  Dysphagia/inadequate oral intake: Patient is being followed by SLP, currently recommendations is to keep n.p.o., MBS is scheduled for tomorrow.  Will likely need PEG tube however daughter wants to see a little bit more before agreeing to that.  He remains on tube feeding in the meantime.   Thrombocytosis: We need to add aspirin once off of anticoagulation. Monitor.    Mild transaminases, history of hepatitis C, improved.   Diarrhea: Likely due to tube feedings.  Resolved.  DVT prophylaxis:   Lovenox   Code Status: Full Code  Family Communication: Discussed with daughter at the bedside.  Status is: Inpatient Remains inpatient appropriate because: Needs  placement.   Estimated body mass index is 22.08 kg/m as calculated from the following:   Height as of this  encounter: 5\' 10"  (1.778 m).   Weight as of this encounter: 69.8 kg.    Nutritional Assessment: Body mass index is 22.08 kg/m.Marland Kitchen Seen by dietician.  I agree with the assessment and plan as outlined below: Nutrition Status: Nutrition Problem: Severe Malnutrition Etiology: social / environmental circumstances (drug abuse) Signs/Symptoms: severe fat depletion, severe muscle depletion Interventions: Tube feeding  . Skin Assessment: I have examined the patient's skin and I agree with the wound assessment as performed by the wound care RN as outlined below:    Consultants:  Neurology Palliative care  Procedures:  As above  Antimicrobials:  Anti-infectives (From admission, onward)    Start     Dose/Rate Route Frequency Ordered Stop   10/23/21 1000  cefTRIAXone (ROCEPHIN) 2 g in sodium chloride 0.9 % 100 mL IVPB  Status:  Discontinued        2 g 200 mL/hr over 30 Minutes Intravenous Every 24 hours 10/22/21 0036 10/22/21 0037   10/23/21 1000  cefTRIAXone (ROCEPHIN) 2 g in sodium chloride 0.9 % 100 mL IVPB  Status:  Discontinued        2 g 200 mL/hr over 30 Minutes Intravenous Every 24 hours 10/22/21 0037 10/22/21 0827   10/22/21 0930  piperacillin-tazobactam (ZOSYN) IVPB 3.375 g        3.375 g 12.5 mL/hr over 240 Minutes Intravenous Every 8 hours 10/22/21 0833 10/29/21 0559   10/22/21 0915  ceFEPIme (MAXIPIME) 2 g in sodium chloride 0.9 % 100 mL IVPB  Status:  Discontinued        2 g 200 mL/hr over 30 Minutes Intravenous Every 8 hours 10/22/21 0827 10/22/21 0833         Subjective:  Patient's and examined.  He was fully alert and not as fidgety as he was yesterday.  He actually said good morning this morning.  He was not able to have any other meaningful conversation with me.  Daughter was at the bedside.  Objective: Vitals:   11/02/21 2000 11/02/21 2315 11/03/21 0727 11/03/21 1157  BP: (!) 130/109 (!) 123/90 (!) 129/102 (!) 122/91  Pulse: 78 (!) 101 (!) 104 99  Resp: 16  17 16 17   Temp: 98.1 F (36.7 C) 98 F (36.7 C) 97.6 F (36.4 C)   TempSrc: Oral Axillary Oral   SpO2: (!) 89% 99% 98% 98%  Weight:      Height:        Intake/Output Summary (Last 24 hours) at 11/03/2021 1431 Last data filed at 11/03/2021 [id number] Gross per 24 hour  Intake --  Output 1000 ml  Net -1000 ml    Filed Weights   10/31/21 0500 11/01/21 0500 11/02/21 0500  Weight: 75.5 kg 73 kg 69.8 kg    Examination:  General exam: Appears calm and comfortable  Respiratory system: Clear to auscultation. Respiratory effort normal. Cardiovascular system: S1 & S2 heard, RRR. No JVD, murmurs, rubs, gallops or clicks. No pedal edema. Gastrointestinal system: Abdomen is nondistended, soft and nontender. No organomegaly or masses felt. Normal bowel sounds heard. Central nervous system: Alert but not oriented.  Data Reviewed: I have personally reviewed following labs and imaging studies  CBC: Recent Labs  Lab 10/30/21 0239 10/31/21 1054 11/01/21 0722 11/02/21 0823 11/03/21 0428  WBC 11.1* 13.5* 13.3* 11.8* 11.9*  NEUTROABS  --  10.0* 9.5* 8.6* 8.0*  HGB 13.9 14.0 14.2 14.2  13.8  HCT 41.4 41.9 42.6 41.7 41.7  MCV 94.3 93.9 93.4 94.6 95.9  PLT 789* 740* 584* 612* 574*    Basic Metabolic Panel: Recent Labs  Lab 10/30/21 0239 10/31/21 1054 11/01/21 0722 11/02/21 0823 11/03/21 0428  NA 138 136 138 138 135  K 4.0 4.3 4.1 4.1 4.4  CL 106 102 103 102 103  CO2 22 25 24 24 24   GLUCOSE 121* 124* 114* 132* 120*  BUN 27* 16 20 21* 22*  CREATININE 0.71 0.73 0.77 0.72 0.65  CALCIUM 9.5 9.5 9.7 9.7 9.2  MG  --  2.2 2.2 2.4 2.2  PHOS  --  3.9 3.9 4.2 4.0    GFR: Estimated Creatinine Clearance: 100.6 mL/min (by C-G formula based on SCr of 0.65 mg/dL). Liver Function Tests: Recent Labs  Lab 10/30/21 0239 10/31/21 1054 11/01/21 0722 11/02/21 0823 11/03/21 0428  AST 33 34 41 39 37  ALT 57* 60* 74* 76* 73*  ALKPHOS 117 118 125 123 113  BILITOT 0.6 0.4 0.4 0.4 0.4  PROT 7.4  7.3 7.4 7.2 7.1  ALBUMIN 2.8* 2.9* 3.0* 3.0* 3.0*    No results for input(s): "LIPASE", "AMYLASE" in the last 168 hours. Recent Labs  Lab 10/31/21 0801 11/01/21 0721 11/02/21 0823 11/03/21 0428  AMMONIA 42* 26 28 42*    Coagulation Profile: No results for input(s): "INR", "PROTIME" in the last 168 hours. Cardiac Enzymes: Recent Labs  Lab 10/31/21 1054  CKTOTAL 41*  CKMB 1.3    BNP (last 3 results) No results for input(s): "PROBNP" in the last 8760 hours. HbA1C: No results for input(s): "HGBA1C" in the last 72 hours. CBG: Recent Labs  Lab 11/02/21 1153 11/02/21 1646 11/02/21 2002 11/03/21 0007 11/03/21 1226  GLUCAP 125* 146* 113* 124* 112*    Lipid Profile: No results for input(s): "CHOL", "HDL", "LDLCALC", "TRIG", "CHOLHDL", "LDLDIRECT" in the last 72 hours. Thyroid Function Tests: No results for input(s): "TSH", "T4TOTAL", "FREET4", "T3FREE", "THYROIDAB" in the last 72 hours. Anemia Panel: No results for input(s): "VITAMINB12", "FOLATE", "FERRITIN", "TIBC", "IRON", "RETICCTPCT" in the last 72 hours. Sepsis Labs: No results for input(s): "PROCALCITON", "LATICACIDVEN" in the last 168 hours.  Recent Results (from the past 240 hour(s))  Gastrointestinal Panel by PCR , Stool     Status: None   Collection Time: 10/29/21  5:51 PM   Specimen: Stool  Result Value Ref Range Status   Campylobacter species NOT DETECTED NOT DETECTED Final   Plesimonas shigelloides NOT DETECTED NOT DETECTED Final   Salmonella species NOT DETECTED NOT DETECTED Final   Yersinia enterocolitica NOT DETECTED NOT DETECTED Final   Vibrio species NOT DETECTED NOT DETECTED Final   Vibrio cholerae NOT DETECTED NOT DETECTED Final   Enteroaggregative E coli (EAEC) NOT DETECTED NOT DETECTED Final   Enteropathogenic E coli (EPEC) NOT DETECTED NOT DETECTED Final   Enterotoxigenic E coli (ETEC) NOT DETECTED NOT DETECTED Final   Shiga like toxin producing E coli (STEC) NOT DETECTED NOT DETECTED Final    Shigella/Enteroinvasive E coli (EIEC) NOT DETECTED NOT DETECTED Final   Cryptosporidium NOT DETECTED NOT DETECTED Final   Cyclospora cayetanensis NOT DETECTED NOT DETECTED Final   Entamoeba histolytica NOT DETECTED NOT DETECTED Final   Giardia lamblia NOT DETECTED NOT DETECTED Final   Adenovirus F40/41 NOT DETECTED NOT DETECTED Final   Astrovirus NOT DETECTED NOT DETECTED Final   Norovirus GI/GII NOT DETECTED NOT DETECTED Final   Rotavirus A NOT DETECTED NOT DETECTED Final   Sapovirus (I, II,  IV, and V) NOT DETECTED NOT DETECTED Final    Comment: Performed at [facility], [address], [place], [place] [address]     Radiology Studies: No results found.  Scheduled Meds:  amLODipine  5 mg Oral Daily   enoxaparin (LOVENOX) injection  70 mg Subcutaneous Q12H   feeding supplement (PROSource TF20)  60 mL Per Tube Daily   fiber supplement (BANATROL TF)  60 mL Per Tube BID   free water  150 mL Per Tube Q4H   melatonin  5 mg Per Tube QHS   mouth rinse  15 mL Mouth Rinse 4 times per day   pantoprazole  40 mg Per Tube Daily   thiamine (VITAMIN B1) injection  100 mg Intravenous Daily   Continuous Infusions:  feeding supplement (JEVITY 1.5 CAL/FIBER) 1,000 mL (11/01/21 0940)     LOS: 13 days   Darliss Cheney, MD [facility]  11/03/2021, 2:31 PM   *Please note that this is a verbal dictation therefore any spelling or grammatical errors are due to the "[employer]" system interpretation.  Please page via [place] and do not message via secure chat for urgent patient care matters. Secure chat can be used for non urgent patient care matters.  How to contact the [facility] Attending or Consulting provider [place] or covering provider during after hours [place], for this patient?  Check the care team in [facility] and look for a) attending/consulting TR[facility] provider listed and b) the [facility] team listed. Page or secure chat [phone]. Log into [email or web address] and use [place]'s universal  password to access. If you do not have the password, please contact the hospital operator. Locate the [facility] provider you are looking for under [facility] and page to a number that you can be directly reached. If you still have difficulty reaching the provider, please page the [facility] (Director on Call) for the Hospitalists listed on amion for assistance.

## 2021-11-03 NOTE — Progress Notes (Signed)
Occupational Therapy Treatment Patient Details Name: [name] MRN: [id number] DOB: 28-Jul-1965 Today's Date: 11/03/2021   History of present illness Pt is 56 yo male admitted on to [facility] health in [place] on 9/6 after being found unresponsive.  Pt transferred to [facility] on 10/21/21.  Pt with acute toxic metabolic encephalopathy vs encephalitis in pt with acute ischemic strokes per neurology.  9/11 R UE DVT started on lovenox, 9/14-9/16 chest tube, ETT 9/6-9/18.  Hx of polysubstance abuse, hep C, and GERD.   OT comments  Pt with gradual progression towards established OT goals. Eyes open full session, and tracking therapist from R to L 2x as well as scanning to locate tech on L side of bed 1x. Pt following direct simple commands during bed mobility with max cues. Requiring mod A +2 for bed mobility. Performing sit<>stand x1 with mod-max A +2; max multimodal cues to initiate. Hand over hand to wash face at bed level. Initiating mutual engagement with music and pt maintaining eye contact with therapist 5+ minutes as well as tapping feet to music while in supine. Pt mouthing ~8 words of song during session, but not vocalizing. Continue to recommend discharge at SNF for continued OT to optimize safety and independence in ADL.    Recommendations for follow up therapy are one component of a multi-disciplinary discharge planning process, led by the attending physician.  Recommendations may be updated based on patient status, additional functional criteria and insurance authorization.    Follow Up Recommendations  Skilled nursing-short term rehab (<3 hours/day)    Assistance Recommended at Discharge Frequent or constant Supervision/Assistance  Patient can return home with the following  Two people to help with walking and/or transfers;A lot of help with bathing/dressing/bathroom;Assistance with cooking/housework;Assistance with feeding;Direct supervision/assist for medications management;Direct  supervision/assist for financial management;Assist for transportation;Help with stairs or ramp for entrance   Equipment Recommendations  Wheelchair (measurements OT);Wheelchair cushion (measurements OT);BSC/3in1;Hospital bed    Recommendations for Other Services      Precautions / Restrictions Precautions Precautions: Fall Precaution Comments: posey belt Restrictions Weight Bearing Restrictions: No       Mobility Bed Mobility Overal bed mobility: Needs Assistance Bed Mobility: Sit to Supine, Supine to Sit     Supine to sit: Mod assist, +2 for physical assistance Sit to supine: Mod assist, +2 for physical assistance, +2 for safety/equipment   General bed mobility comments: Mod A with max directional verbal and gestural cues. Pt with good engagement of core to come to sititng, however, requiring mod A overall due to decreased sequencing. Good self-initiation of return to supine, but ultimately mod A for orientation in bed. .    Transfers Overall transfer level: Needs assistance Equipment used: 2 person hand held assist Transfers: Sit to/from Stand Sit to Stand: Max assist, +2 physical assistance, Mod assist           General transfer comment: Initially max to initiate stand, but once pt began to initiate, heavy mod. Heavy assist for anterior weight shift     Balance Overall balance assessment: Needs assistance Sitting-balance support: Feet supported, Bilateral upper extremity supported Sitting balance-Leahy Scale: Poor Sitting balance - Comments: Initially min A, but pt with poor head and trunk control, and requiring up to mod A to maintain sitting balance. Postural control: Posterior lean Standing balance support: Bilateral upper extremity supported Standing balance-Leahy Scale: Zero Standing balance comment: Mod-max A to maintain standing balance ~30 seconds.  ADL either performed or assessed with clinical judgement   ADL  Overall ADL's : Needs assistance/impaired     Grooming: Total assistance;Bed level Grooming Details (indicate cue type and reason): hand over hand with difficulty following commands.                 Toilet Transfer: Moderate assistance;+2 for physical assistance (sit<>stand) Toilet Transfer Details (indicate cue type and reason): No initiation without max multimodal cues and provision of assistance to initiate.         Functional mobility during ADLs: Maximal assistance;+2 for physical assistance (STS)      Extremity/Trunk Assessment Upper Extremity Assessment Upper Extremity Assessment: RUE deficits/detail;LUE deficits/detail RUE Deficits / Details: Some active movement. Light movement in supine LUE Deficits / Details: Some active movement, Reached out with LUE to touch therapist's hand. Light movement in supine   Lower Extremity Assessment Lower Extremity Assessment: Defer to PT evaluation        Vision   Vision Assessment?: Vision impaired- to be further tested in functional context Additional Comments: Eye contact up to ~5 seconds this date. Otherwise, non-focal gaze with R gaze preference at rest. Some tracking or therapist from R to L ~2X   Perception     Praxis      Cognition Arousal/Alertness: Awake/alert Behavior During Therapy: Restless Overall Cognitive Status: Impaired/Different from baseline Area of Impairment: Following commands, Problem solving, Attention, Safety/judgement, Awareness                   Current Attention Level: Focused   Following Commands: Follows one step commands inconsistently, Follows one step commands with increased time (Following direct simple commands within bed mobility, but max decreased ability to follow commands during ADL; no active attempt to self-initiate washing face, no thumbs up, etc. Did reach out to shake therapist's hand following implementation of music) Safety/Judgement: Decreased awareness of safety,  Decreased awareness of deficits (requires posey belt) Awareness:  (Very little awareness. Aware of therapist's facial expressions; will reciprocate some mutual expression with eyes (widening)) Problem Solving: Slow processing, Requires verbal cues, Requires tactile cues, Decreased initiation, Difficulty sequencing General Comments: pt did mumble "what" when "Chanetta Marshall" was stated by PT, pt with generalized R gaze, could track to the L but wouldn't stay there, would return to midline or R side. Pt otherwise with very limited verbalizations. Unable to vocalize, but did mouth ~10 words to song "Thunderstruck" on two occasions following mobility. Taping feet in bed with music as well. Overall follows commands of "pull" and "hold", but following few other commands. Max encouragement to reach out and touch therapist's hand.        Exercises Exercises: Other exercises Other Exercises Other Exercises: visual tracking from R (preferred) to L (non-preferred)    Shoulder Instructions       General Comments VSS    Pertinent Vitals/ Pain       Pain Assessment Pain Assessment: Faces Faces Pain Scale: No hurt Pain Intervention(s): Monitored during session  Home Living                                          Prior Functioning/Environment              Frequency  Min 2X/week        Progress Toward Goals  OT Goals(current goals can now be found in the care plan section)  Progress towards OT goals: Progressing toward goals  Acute Rehab OT Goals Patient Stated Goal: none stated OT Goal Formulation: Patient unable to participate in goal setting Time For Goal Achievement: 11/11/21 Potential to Achieve Goals: Fair ADL Goals Pt Will Perform Grooming: with mod assist;sitting Pt Will Perform Upper Body Bathing: with mod assist;sitting Pt Will Transfer to Toilet: with max assist;stand pivot transfer;bedside commode Pt Will Perform Toileting - Clothing Manipulation and  hygiene: with max assist;sit to/from stand Additional ADL Goal #1: Pt will sit EOB at min guard level for 5 min inorder to build up activity tolerance for ADL participation Additional ADL Goal #2: Pt will perform bed mobility as precursor to ADL at max A.  Plan Discharge plan remains appropriate    Co-evaluation                 AM-PAC OT "6 Clicks" Daily Activity     Outcome Measure   Help from another person eating meals?: Total Help from another person taking care of personal grooming?: Total Help from another person toileting, which includes using toliet, bedpan, or urinal?: Total Help from another person bathing (including washing, rinsing, drying)?: Total Help from another person to put on and taking off regular upper body clothing?: Total Help from another person to put on and taking off regular lower body clothing?: Total 6 Click Score: 6    End of Session Equipment Utilized During Treatment: Gait belt  OT Visit Diagnosis: Other symptoms and signs involving cognitive function;Muscle weakness (generalized) (M62.81);Other abnormalities of gait and mobility (R26.89)   Activity Tolerance Patient tolerated treatment well   Patient Left in bed;with call bell/phone within reach;with bed alarm set   Nurse Communication Mobility status        Time: 1330-1400 OT Time Calculation (min): 30 min  Charges: OT General Charges $OT Visit: 1 Visit OT Treatments $Therapeutic Activity: 8-22 mins $Therapeutic Exercise: 8-22 mins  Shanda Howells, OTR/L [facility] Acute Rehabilitation Office: [phone]   Lula Olszewski 11/03/2021, 2:32 PM

## 2021-11-03 NOTE — Progress Notes (Signed)
HOSPITAL MEDICINE OVERNIGHT EVENT NOTE    Notified by nursing that patient's restraint order has expired.  According to nursing patient continues to be quite confused.  Patient is not redirectable, is frequently attempting to get a bed and is placing himself at risk of injury.  Nursing has attempted to increase bedside rounding as well as placing an RN in the room to serve as a sitter and attempt to keep the patient from injuring himself.  We will go ahead and renew the order for the patient's soft waist belt.  We will also place a formal order for a sitter and if available should be provided.  Continue to monitor patient closely.  Vernelle Emerald  MD [facility]

## 2021-11-03 NOTE — Progress Notes (Signed)
Speech Language Pathology Treatment: Dysphagia;Cognitive-Linquistic  Patient Details Name: [name] MRN: [id number] DOB: 1965-12-04 Today's Date: 11/03/2021 Time: 2423-5361 SLP Time Calculation (min) (ACUTE ONLY): 20 min  Assessment / Plan / Recommendation Clinical Impression  Pt seen with daughter at bedside; pt alert and restless but does not focus attention in most, if not all, attempts to engage pt. There were times during session that he made a social /automatic verbal response to SLP or daughter. Pt did not follow commands, could not give his name, repeat his own name or respond Y/N to an incorrect or correct name. Pt would not grasp a cup or spoon. Started to orally accept spoon after he tasted pudding, but still not truly attentive to food. Pt able to take a sip from a straw today, which is an improvement from last week and is an indication of readiness for MBS attempt. Pt does have prolonged coughing after sip. Pt currently appears capable of oral intake, though anticipate that a modified texture will be recommended at first (likely thickened liquids and pureed foods). However, emphasized to daughter that even if pt can swallow and be fed meals, his mentation is so impaired that he will likely be at risk of malnutrition into the future. He doesn't feed himself, make requests or verbalize. Daughter says she wants pt to have access to as much rehab as possible, which would require consistent access to nutrition and medication. Daughter would like a rehab consult. Did not discuss rehab requirements at this time. Might be best for rehab specialist to discuss with her.    HPI HPI: 56 year old male with past medical history significant for polysubstance abuse, hepatitis C, and GERD who presented to [facility] in [place] on 9/6 after being found down. He was bagged in route to the emergency department at which he arrived around 2300 hrs.  He was intubated immediately upon arrival to the  ED 9/6.  CT of the head demonstrated cerebral edema without loss of white matter differentiation.  MRI shows Multifocal, primarily small acute infarcts involving bilateral cerebral hemispheres.. Extubated 9/18.      SLP Plan  Continue with current plan of care      Recommendations for follow up therapy are one component of a multi-disciplinary discharge planning process, led by the attending physician.  Recommendations may be updated based on patient status, additional functional criteria and insurance authorization.    Recommendations  Diet recommendations: NPO                General recommendations: Rehab consult Oral Care Recommendations: Oral care QID Follow Up Recommendations: Acute inpatient rehab (3hours/day) Assistance recommended at discharge: Frequent or constant Supervision/Assistance SLP Visit Diagnosis: Cognitive communication deficit (W43.154) Plan: Continue with current plan of care           Tracee Mccreery, Katherene Ponto  11/03/2021, 1:15 PM

## 2021-11-03 NOTE — Plan of Care (Signed)
  Problem: Activity: Goal: Risk for activity intolerance will decrease Outcome: Progressing   Problem: Nutrition: Goal: Adequate nutrition will be maintained Outcome: Progressing   Problem: Coping: Goal: Level of anxiety will decrease Outcome: Progressing   

## 2021-11-04 ENCOUNTER — Inpatient Hospital Stay (HOSPITAL_COMMUNITY): Payer: Medicaid Other

## 2021-11-04 DIAGNOSIS — G9340 Encephalopathy, unspecified: Secondary | ICD-10-CM | POA: Diagnosis not present

## 2021-11-04 DIAGNOSIS — G931 Anoxic brain damage, not elsewhere classified: Secondary | ICD-10-CM | POA: Diagnosis not present

## 2021-11-04 DIAGNOSIS — J9601 Acute respiratory failure with hypoxia: Secondary | ICD-10-CM | POA: Diagnosis not present

## 2021-11-04 DIAGNOSIS — E43 Unspecified severe protein-calorie malnutrition: Secondary | ICD-10-CM | POA: Diagnosis not present

## 2021-11-04 LAB — COMPREHENSIVE METABOLIC PANEL
ALT: 69 U/L — ABNORMAL HIGH (ref 0–44)
AST: 33 U/L (ref 15–41)
Albumin: 2.9 g/dL — ABNORMAL LOW (ref 3.5–5.0)
Alkaline Phosphatase: 117 U/L (ref 38–126)
Anion gap: 10 (ref 5–15)
BUN: 20 mg/dL (ref 6–20)
CO2: 27 mmol/L (ref 22–32)
Calcium: 9.4 mg/dL (ref 8.9–10.3)
Chloride: 101 mmol/L (ref 98–111)
Creatinine, Ser: 0.79 mg/dL (ref 0.61–1.24)
GFR, Estimated: >60 mL/min (ref >60)
Glucose, Bld: 122 mg/dL — ABNORMAL HIGH (ref 70–99)
Potassium: 3.8 mmol/L (ref 3.5–5.1)
Sodium: 138 mmol/L (ref 135–145)
Total Bilirubin: 0.3 mg/dL (ref 0.3–1.2)
Total Protein: 6.8 g/dL (ref 6.5–8.1)

## 2021-11-04 LAB — GLUCOSE, CAPILLARY
Glucose-Capillary: 146 mg/dL — ABNORMAL HIGH (ref 70–99)
Glucose-Capillary: 140 mg/dL — ABNORMAL HIGH (ref 70–99)
Glucose-Capillary: 140 mg/dL — ABNORMAL HIGH (ref 70–99)
Glucose-Capillary: 112 mg/dL — ABNORMAL HIGH (ref 70–99)
Glucose-Capillary: 114 mg/dL — ABNORMAL HIGH (ref 70–99)

## 2021-11-04 LAB — CBC WITH DIFFERENTIAL/PLATELET
Abs Immature Granulocytes: 0.24 10*3/uL — ABNORMAL HIGH (ref 0.00–0.07)
Basophils Absolute: 0.1 10*3/uL (ref 0.0–0.1)
Basophils Relative: 1 %
Eosinophils Absolute: 0.2 10*3/uL (ref 0.0–0.5)
Eosinophils Relative: 2 %
HCT: 39.4 % (ref 39.0–52.0)
Hemoglobin: 13.4 g/dL (ref 13.0–17.0)
Immature Granulocytes: 2 %
Lymphocytes Relative: 20 %
Lymphs Abs: 2 10*3/uL (ref 0.7–4.0)
MCH: 32 pg (ref 26.0–34.0)
MCHC: 34 g/dL (ref 30.0–36.0)
MCV: 94 fL (ref 80.0–100.0)
Monocytes Absolute: 0.6 10*3/uL (ref 0.1–1.0)
Monocytes Relative: 6 %
Neutro Abs: 7 10*3/uL (ref 1.7–7.7)
Neutrophils Relative %: 69 %
Platelets: 541 10*3/uL — ABNORMAL HIGH (ref 150–400)
RBC: 4.19 MIL/uL — ABNORMAL LOW (ref 4.22–5.81)
RDW: 13 % (ref 11.5–15.5)
WBC: 10.2 10*3/uL (ref 4.0–10.5)
nRBC: 0 % (ref 0.0–0.2)

## 2021-11-04 LAB — AMMONIA: Ammonia: 24 umol/L (ref 9–35)

## 2021-11-04 LAB — PHOSPHORUS: Phosphorus: 4.2 mg/dL (ref 2.5–4.6)

## 2021-11-04 LAB — MAGNESIUM: Magnesium: 2.3 mg/dL (ref 1.7–2.4)

## 2021-11-04 NOTE — Progress Notes (Signed)
   Palliative Medicine Inpatient Follow Up Note  HPI: 56 year old WM PMHx Polysubstance abuse, Hepatitis C, and GERD.   Presented to [facility] in [place] on 9/6 after being found down.  He was last known well at approximately 13:00 hours that day however EMS was called for wellness check at some time later on.  He was found to be down and unresponsive.  On EMS evaluation the patient was apneic with thready pulse.  He did not responded to intranasal Narcan.  He was bagged in route to the emergency department and subsequently intubated on arrival to the ED.  CT head demonstrated cerebral edema without loss of white matter differentiation.  He was transferred to [facility] for MRI.  On 9/11 he was noted to have upper extremity edema on the right.  Ultrasound demonstrated acute thrombosis in the mid and distal basilic vein.  Hospital course also complicated by aspiration pneumonia.  Subsequently transferred to [facility] on 9/13.     Palliative care was asked to get involved to further address goals of care in the setting of an acute stroke.  Today's Discussion 11/04/2021  *Please note that this is a verbal dictation therefore any spelling or grammatical errors are due to the "[place] One" system interpretation.  Chart reviewed inclusive of vital signs, progress notes, laboratory results, and diagnostic images.  Patient assessed at the bedside and is sleeping comfortably. Did not arouse to my voice and did not attempt further to allow rest. No family present during my visit.    I called patient's daughter [name] for ongoing palliative support and goals of care conversation but was unable to reach.  Voicemail with PMT contact information was provided and she was encouraged to call back with any needs.   Objective Assessment: Vital Signs Vitals:   11/04/21 0722 11/04/21 1128  BP: (!) 134/95 116/84  Pulse: 95 91  Resp: 16 18  Temp: 98.4 F (36.9 C) 98.2 F (36.8 C)   SpO2: 98% 97%    Intake/Output Summary (Last 24 hours) at 11/04/2021 1415 Last data filed at 11/04/2021 7829 Gross per 24 hour  Intake --  Output 975 ml  Net -975 ml    Gen: Older Caucasian male in no acute distress, sleeping HEENT: Cord track in place, dry mucous membranes CV: Irregular rate and regular rhythm  PULM: On room air breathing is even and nonlabored ABD: soft/nontender  EXT: No edema   SUMMARY OF RECOMMENDATIONS   -Continue full code/full scope treatment -PMT will continue to follow and support incrementally.  Please call team line or secure chat with urgent needs  I spent 25 minutes in the care of the patient today in the above activities and documenting the encounter.  Dorthy Cooler, PA-C [facility] Palliative Medicine Team Team Cell Phone: [phone] Please utilize secure chat with additional questions, if there is no response within 30 minutes please call the above phone number  Palliative Medicine Team providers are available by phone from 7am to 7pm daily and can be reached through the team cell phone.  Should this patient require assistance outside of these hours, please call the patient's attending physician.

## 2021-11-04 NOTE — Progress Notes (Signed)
IR was requested for image guided G tube placement.   Ct was reviewed and anatomy is amenable for percutaneous G tube placement; however, daughter was not ready for G tube placement and she wanted to wait and see if patient can tolerate PO intake again.    SLP evaluated the patient today, patient is now on dysphagia 3 diet, need for ongoing enteral feeding to be determined by pts oral intake.    Will delete the G tube order, please re-order G tube placement if needed.   Bellamy Rubey Lemmie Evens Alyse Kathan PA-C 11/04/2021 10:03 AM

## 2021-11-04 NOTE — Progress Notes (Signed)
PROGRESS NOTE    [name]  ZOX:[id number] DOB: 08-01-1965 DOA: 10/21/2021 PCP: [name], MD   Brief Narrative:  56 year old WM PMHx Polysubstance abuse, Hepatitis C,    Presented to [facility] in [name] on 9/6 after being found down.  He was last known well at approximately 13:00 hours that day however EMS was called for wellness check at some time later on.  He was found to be down and unresponsive.  On EMS evaluation the patient was apneic with thready pulse.  He did not responded to intranasal Narcan.  He was bagged in route to the emergency department and subsequently intubated on arrival to the ED.  CT head demonstrated cerebral edema without loss of white matter differentiation.  He was transferred to [facility] for MRI.  On 9/11 he was noted to have upper extremity edema on the right.  Ultrasound demonstrated acute thrombosis in the mid and distal basilic vein.  Hospital course also complicated by aspiration pneumonia.  Subsequently transferred to [facility] on 9/13.  Events:  9/6 found down > present to [facility] ED. CT with bilateral cerebellar edema. 9/7 repeat CT normal Unable to be weaned from sedation 9/11 RUE DVT started on tx dose lovenox 9/13 transfer to [facility] for MRI 9/14 chest tube placed with intrapleural fibrinolytic administration 9/16 chest tube removed 9/17 patient extubated, remained agitated and started on precedex 9/19 Cortrak placed     Assessment & Plan:   Principal Problem:   Acute encephalopathy Active Problems:   Fever   Pleural effusion   Deep venous thrombosis (HCC)   Acute respiratory failure with hypoxia (HCC)   Cerebrovascular accident (CVA) (HCC)   Protein-calorie malnutrition, severe   Anoxic brain injury (HCC)  Acute toxic metabolic encephalopathy in the setting of ischemic stroke: -On high-dose thiamine -In the setting of acute stroke. -LP performed, meningitis panel was negative. -UDS was positive for  cocaine. -EEG-persistently showed no epileptiform activity -Per neurology high suspicion for anoxic brain injury versus toxin induced encephalopathy and ischemic strokes. Neurology recommend continue with thiamine, no immediate indication for addition of antiplatelets for secondary stroke prevention as patient is on therapeutic Lovenox.  When patient is off of anticoagulation, aspirin should be started.   2-Multifocal cerebral infarcts: -Neurology recommended to start aspirin when patient is off of anticoagulation -Currently on Lovenox for treatment of vein thrombosis.   3-Acute hypoxic respiratory failure secondary to aspiration pneumonia Right-sided aspiration pneumonia with parapneumonic effusion: -Status post chest tube placement with intrapleural lytics on 9/14, chest tube removed 9/16. -Completed 7 days of IV Zosyn -Intubated on admission outside hospital 9/6. -Extubated 9/17. -Currently on Room air.    Superficial vein thrombosis Diagnosed 9/11 with right basilic vein thrombus -Due to increase for progression of thrombosis patient was started on Lovenox after previous hospitalist discussed with oncology Dr. Clelia Croft.  Dysphagia/inadequate oral intake: Patient is being followed by SLP, they have now advised him to dysphagia 3 diet.  We will monitor closely.   Thrombocytosis: We need to add aspirin once off of anticoagulation. Monitor.    Mild transaminases, history of hepatitis C, improved.   Diarrhea: Likely due to tube feedings.  Resolved.  DVT prophylaxis:   Lovenox   Code Status: Full Code  Family Communication: Discussed with daughter at the bedside yesterday.  Status is: Inpatient Remains inpatient appropriate because: Needs placement.   Estimated body mass index is 22.52 kg/m as calculated from the following:   Height as of this encounter: 5\' 10"  (  1.778 m).   Weight as of this encounter: 71.2 kg.    Nutritional Assessment: Body mass index is 22.52 kg/m.Marland Kitchen Seen  by dietician.  I agree with the assessment and plan as outlined below: Nutrition Status: Nutrition Problem: Severe Malnutrition Etiology: social / environmental circumstances (drug abuse) Signs/Symptoms: severe fat depletion, severe muscle depletion Interventions: Tube feeding  . Skin Assessment: I have examined the patient's skin and I agree with the wound assessment as performed by the wound care RN as outlined below:    Consultants:  Neurology Palliative care  Procedures:  As above  Antimicrobials:  Anti-infectives (From admission, onward)    Start     Dose/Rate Route Frequency Ordered Stop   10/23/21 1000  cefTRIAXone (ROCEPHIN) 2 g in sodium chloride 0.9 % 100 mL IVPB  Status:  Discontinued        2 g 200 mL/hr over 30 Minutes Intravenous Every 24 hours 10/22/21 0036 10/22/21 0037   10/23/21 1000  cefTRIAXone (ROCEPHIN) 2 g in sodium chloride 0.9 % 100 mL IVPB  Status:  Discontinued        2 g 200 mL/hr over 30 Minutes Intravenous Every 24 hours 10/22/21 0037 10/22/21 0827   10/22/21 0930  piperacillin-tazobactam (ZOSYN) IVPB 3.375 g        3.375 g 12.5 mL/hr over 240 Minutes Intravenous Every 8 hours 10/22/21 0833 10/29/21 0559   10/22/21 0915  ceFEPIme (MAXIPIME) 2 g in sodium chloride 0.9 % 100 mL IVPB  Status:  Discontinued        2 g 200 mL/hr over 30 Minutes Intravenous Every 8 hours 10/22/21 0827 10/22/21 0833         Subjective:  Seen and examined.  Remains alert but confused at his baseline.  Objective: Vitals:   11/03/21 2346 11/04/21 0339 11/04/21 0500 11/04/21 0722  BP: (!) 140/99 (!) 132/98  (!) 134/95  Pulse: 100 (!) 103  95  Resp: 18 16  16   Temp: 98.7 F (37.1 C) 98.7 F (37.1 C)  98.4 F (36.9 C)  TempSrc:    Oral  SpO2: 100% 100%  98%  Weight:   71.2 kg   Height:        Intake/Output Summary (Last 24 hours) at 11/04/2021 1045 Last data filed at 11/04/2021 0921 Gross per 24 hour  Intake --  Output 975 ml  Net -975 ml    Filed  Weights   11/01/21 0500 11/02/21 0500 11/04/21 0500  Weight: 73 kg 69.8 kg 71.2 kg    Examination:  General exam: Appears calm and comfortable  Respiratory system: Clear to auscultation. Respiratory effort normal. Cardiovascular system: S1 & S2 heard, RRR. No JVD, murmurs, rubs, gallops or clicks. No pedal edema. Gastrointestinal system: Abdomen is nondistended, soft and nontender. No organomegaly or masses felt. Normal bowel sounds heard. Central nervous system: Alert but not oriented.  Data Reviewed: I have personally reviewed following labs and imaging studies  CBC: Recent Labs  Lab 10/31/21 1054 11/01/21 0722 11/02/21 0823 11/03/21 0428 11/04/21 0717  WBC 13.5* 13.3* 11.8* 11.9* 10.2  NEUTROABS 10.0* 9.5* 8.6* 8.0* 7.0  HGB 14.0 14.2 14.2 13.8 13.4  HCT 41.9 42.6 41.7 41.7 39.4  MCV 93.9 93.4 94.6 95.9 94.0  PLT 740* 584* 612* 574* 541*    Basic Metabolic Panel: Recent Labs  Lab 10/31/21 1054 11/01/21 0722 11/02/21 0823 11/03/21 0428 11/04/21 0717  NA 136 138 138 135 138  K 4.3 4.1 4.1 4.4 3.8  CL 102 103  102 103 101  CO2 25 24 24 24 27   GLUCOSE 124* 114* 132* 120* 122*  BUN 16 20 21* 22* 20  CREATININE 0.73 0.77 0.72 0.65 0.79  CALCIUM 9.5 9.7 9.7 9.2 9.4  MG 2.2 2.2 2.4 2.2 2.3  PHOS 3.9 3.9 4.2 4.0 4.2    GFR: Estimated Creatinine Clearance: 102.6 mL/min (by C-G formula based on SCr of 0.79 mg/dL). Liver Function Tests: Recent Labs  Lab 10/31/21 1054 11/01/21 0722 11/02/21 0823 11/03/21 0428 11/04/21 0717  AST 34 41 39 37 33  ALT 60* 74* 76* 73* 69*  ALKPHOS 118 125 123 113 117  BILITOT 0.4 0.4 0.4 0.4 0.3  PROT 7.3 7.4 7.2 7.1 6.8  ALBUMIN 2.9* 3.0* 3.0* 3.0* 2.9*    No results for input(s): "LIPASE", "AMYLASE" in the last 168 hours. Recent Labs  Lab 10/31/21 0801 11/01/21 0721 11/02/21 0823 11/03/21 0428 11/04/21 0717  AMMONIA 42* 26 28 42* 24    Coagulation Profile: No results for input(s): "INR", "PROTIME" in the last 168  hours. Cardiac Enzymes: Recent Labs  Lab 10/31/21 1054  CKTOTAL 41*  CKMB 1.3    BNP (last 3 results) No results for input(s): "PROBNP" in the last 8760 hours. HbA1C: No results for input(s): "HGBA1C" in the last 72 hours. CBG: Recent Labs  Lab 11/03/21 1618 11/03/21 2035 11/03/21 2358 11/04/21 0402 11/04/21 0807  GLUCAP 110* 142* 121* 146* 140*    Lipid Profile: No results for input(s): "CHOL", "HDL", "LDLCALC", "TRIG", "CHOLHDL", "LDLDIRECT" in the last 72 hours. Thyroid Function Tests: No results for input(s): "TSH", "T4TOTAL", "FREET4", "T3FREE", "THYROIDAB" in the last 72 hours. Anemia Panel: No results for input(s): "VITAMINB12", "FOLATE", "FERRITIN", "TIBC", "IRON", "RETICCTPCT" in the last 72 hours. Sepsis Labs: No results for input(s): "PROCALCITON", "LATICACIDVEN" in the last 168 hours.  Recent Results (from the past 240 hour(s))  Gastrointestinal Panel by PCR , Stool     Status: None   Collection Time: 10/29/21  5:51 PM   Specimen: Stool  Result Value Ref Range Status   Campylobacter species NOT DETECTED NOT DETECTED Final   Plesimonas shigelloides NOT DETECTED NOT DETECTED Final   Salmonella species NOT DETECTED NOT DETECTED Final   Yersinia enterocolitica NOT DETECTED NOT DETECTED Final   Vibrio species NOT DETECTED NOT DETECTED Final   Vibrio cholerae NOT DETECTED NOT DETECTED Final   Enteroaggregative E coli (EAEC) NOT DETECTED NOT DETECTED Final   Enteropathogenic E coli (EPEC) NOT DETECTED NOT DETECTED Final   Enterotoxigenic E coli (ETEC) NOT DETECTED NOT DETECTED Final   Shiga like toxin producing E coli (STEC) NOT DETECTED NOT DETECTED Final   Shigella/Enteroinvasive E coli (EIEC) NOT DETECTED NOT DETECTED Final   Cryptosporidium NOT DETECTED NOT DETECTED Final   Cyclospora cayetanensis NOT DETECTED NOT DETECTED Final   Entamoeba histolytica NOT DETECTED NOT DETECTED Final   Giardia lamblia NOT DETECTED NOT DETECTED Final   Adenovirus F40/41  NOT DETECTED NOT DETECTED Final   Astrovirus NOT DETECTED NOT DETECTED Final   Norovirus GI/GII NOT DETECTED NOT DETECTED Final   Rotavirus A NOT DETECTED NOT DETECTED Final   Sapovirus (I, II, IV, and V) NOT DETECTED NOT DETECTED Final    Comment: Performed at [facility], [address], [place], [place]     Radiology Studies: DG Swallowing Func-Speech Pathology  Result Date: 11/04/2021 Table formatting from the original result was not included. Images from the original result were not included. Objective Swallowing Evaluation: Type of Study:  MBS-Modified Barium Swallow Study  Patient Details Name: Treveon Bourcier MRN: [id number] Date of Birth: August 27, 1965 Today's Date: 11/04/2021 Time: SLP Start Time (ACUTE ONLY): 0830 -SLP Stop Time (ACUTE ONLY): 0900 SLP Time Calculation (min) (ACUTE ONLY): 30 min Past Medical History: Past Medical History: Diagnosis Date  Acid reflux   Head injury   Hepatitis C   Hiatal hernia  Past Surgical History: Past Surgical History: Procedure Laterality Date  ACROMIO-CLAVICULAR JOINT REPAIR Right 06/28/2018  Procedure: ACROMIO-CLAVICULAR JOINT IRRIGATION AND DEBRIDEMENT;  Surgeon: Cammy Copa, MD;  Location: [facility] OR;  Service: Orthopedics;  Laterality: Right;  FACIAL FRACTURE SURGERY    IRRIGATION AND DEBRIDEMENT SHOULDER Right 06/28/2018  Procedure: IRRIGATION AND DEBRIDEMENT SHOULDER;  Surgeon: Cammy Copa, MD;  Location: [facility] OR;  Service: Orthopedics;  Laterality: Right;  KNEE ARTHROSCOPY Left 06/23/2018  Procedure: LEFT KNEE ARTHROSCOPY KNEE I&D.;  Surgeon: Cammy Copa, MD;  Location: [facility] OR;  Service: Orthopedics;  Laterality: Left; HPI: 56 year old male with past medical history significant for polysubstance abuse, hepatitis C, and GERD who presented to [facility] in [place] on 9/6 after being found down. He was bagged in route to the emergency department at which he arrived around 2300 hrs.  He was intubated immediately upon  arrival to the ED 9/6.  CT of the head demonstrated cerebral edema without loss of white matter differentiation.  MRI shows Multifocal, primarily small acute infarcts involving bilateral cerebral hemispheres.. Extubated 9/18.  No data recorded  Recommendations for follow up therapy are one component of a multi-disciplinary discharge planning process, led by the attending physician.  Recommendations may be updated based on patient status, additional functional criteria and insurance authorization. Assessment / Plan / Recommendation   11/04/2021   9:00 AM Clinical Impressions Clinical Impression Pt demonstrates impaired cognition and awareness, but function is better today than all prior sessions. Pt accepted all trials with total assisted feeding, verbalized intermittently in response to staff present. Pt able to take sips of thin liquid with a straw and masticate solids. Oral phase at times prolonged. Pts position facilitated increased safety; his head was in a forward/chin tucked position, allowing any slight delay in swallow initiation to be compensated for well. There were also instances of mild oral residue and spill to pharynx that pt consistently cleared. No penetration or aspiration occurred. Recommend pt initiate a dys 3 soft) diet with thin liquids and total assisted feeding with aspiration precautions, particularly upright posture. Hopeful that access to PO will continue to encourage attention and awareness to environment. Need for ongoing enteral feeding to be determined by pts oral intake. LIkely will need meds crushed in puree. SLP Visit Diagnosis Dysphagia, oropharyngeal phase (R13.12) Impact on safety and function Mild aspiration risk;Risk for inadequate nutrition/hydration     11/04/2021   9:00 AM Treatment Recommendations Treatment Recommendations Therapy as outlined in treatment plan below      No data to display      11/04/2021   9:00 AM Diet Recommendations SLP Diet Recommendations Dysphagia 3  (Mech soft) solids;Thin liquid Liquid Administration via Cup;Straw Medication Administration Crushed with puree Compensations Slow rate;Small sips/bites;Minimize environmental distractions Postural Changes Seated upright at 90 degrees     11/04/2021   9:00 AM Other Recommendations Oral Care Recommendations Oral care BID Other Recommendations Have oral suction available Assistance recommended at discharge Frequent or constant Supervision/Assistance Functional Status Assessment Patient has had a recent decline in their functional status and demonstrates the ability to make significant improvements in function  in a reasonable and predictable amount of time.   11/04/2021   9:00 AM Frequency and Duration  Speech Therapy Frequency (ACUTE ONLY) min 2x/week Treatment Duration 2 weeks     11/04/2021   9:00 AM Oral Phase Oral Phase Impaired Oral - Thin Cup Delayed oral transit Oral - Thin Straw Delayed oral transit Oral - Puree Delayed oral transit Oral - Regular Delayed oral transit;Decreased bolus cohesion    11/04/2021   9:00 AM Pharyngeal Phase Pharyngeal Phase Impaired Pharyngeal- Thin Cup Delayed swallow initiation-vallecula Pharyngeal- Thin Straw Delayed swallow initiation-vallecula Pharyngeal- Puree Delayed swallow initiation-vallecula Pharyngeal- Regular Delayed swallow initiation-vallecula     No data to display    DeBlois, Riley NearingBonnie Caroline 11/04/2021, 9:25 AM                      Scheduled Meds:  amLODipine  5 mg Oral Daily   enoxaparin (LOVENOX) injection  70 mg Subcutaneous Q12H   feeding supplement (PROSource TF20)  60 mL Per Tube Daily   fiber supplement (BANATROL TF)  60 mL Per Tube BID   free water  150 mL Per Tube Q4H   melatonin  5 mg Per Tube QHS   mouth rinse  15 mL Mouth Rinse 4 times per day   pantoprazole  40 mg Per Tube Daily   thiamine (VITAMIN B1) injection  100 mg Intravenous Daily   Continuous Infusions:  feeding supplement (JEVITY 1.5 CAL/FIBER) 1,000 mL (11/04/21 0358)     LOS: 14  days   Hughie Clossavi Hillery Bhalla, MD [facility]  11/04/2021, 10:45 AM   *Please note that this is a verbal dictation therefore any spelling or grammatical errors are due to the "[employer] One" system interpretation.  Please page via Amion and do not message via secure chat for urgent patient care matters. Secure chat can be used for non urgent patient care matters.  How to contact the [facility] Attending or Consulting provider 7A - 7P or covering provider during after hours 7P -7A, for this patient?  Check the care team in [facility] and look for a) attending/consulting TR[facility] provider listed and b) the [facility] team listed. Page or secure chat 7A-7P. Log into [email or web address] and use 's universal password to access. If you do not have the password, please contact the hospital operator. Locate the [facility] provider you are looking for under [facility] and page to a number that you can be directly reached. If you still have difficulty reaching the provider, please page the [facility] (Director on Call) for the Hospitalists listed on amion for assistance.

## 2021-11-04 NOTE — Progress Notes (Signed)
Modified Barium Swallow Progress Note  Patient Details  Name: [name] MRN: [id number] Date of Birth: July 10, 1965  Today's Date: 11/04/2021  Modified Barium Swallow completed.  Full report located under Chart Review in the Imaging Section.  Brief recommendations include the following:  Clinical Impression  Pt demonstrates impaired cognition and awareness, but function is better today than all prior sessions. Pt accepted all trials with total assisted feeding, verbalized intermittently in response to staff present. Pt able to take sips of thin liquid with a straw and masticate solids. Oral phase at times prolonged. Pts position facilitated increased safety; his head was in a forward/chin tucked position, allowing any slight delay in swallow initiation to be compensated for well. There were also instances of mild oral residue and spill to pharynx that pt consistently cleared. No penetration or aspiration occurred. Recommend pt initiate a dys 3 70mech soft) diet with thin liquids and total assisted feeding with aspiration precautions, particularly upright posture. Hopeful that access to PO will continue to encourage attention and awareness to environment. Need for ongoing enteral feeding to be determined by pts oral intake. LIkely will need meds crushed in puree.   Swallow Evaluation Recommendations       SLP Diet Recommendations: Dysphagia 3 (Mech soft) solids;Thin liquid   Liquid Administration via: Cup;Straw   Medication Administration: Crushed with puree   Supervision: Full supervision/cueing for compensatory strategies;Full assist for feeding   Compensations: Slow rate;Small sips/bites;Minimize environmental distractions   Postural Changes: Seated upright at 90 degrees   Oral Care Recommendations: Oral care BID   Other Recommendations: Have oral suction available    Itamar Mcgowan, Katherene Ponto 11/04/2021,9:25 AM

## 2021-11-05 DIAGNOSIS — G9340 Encephalopathy, unspecified: Secondary | ICD-10-CM | POA: Diagnosis not present

## 2021-11-05 LAB — CBC WITH DIFFERENTIAL/PLATELET
Abs Immature Granulocytes: 0.2 10*3/uL — ABNORMAL HIGH (ref 0.00–0.07)
Basophils Absolute: 0.1 10*3/uL (ref 0.0–0.1)
Basophils Relative: 1 %
Eosinophils Absolute: 0.2 10*3/uL (ref 0.0–0.5)
Eosinophils Relative: 2 %
HCT: 40.8 % (ref 39.0–52.0)
Hemoglobin: 13.6 g/dL (ref 13.0–17.0)
Immature Granulocytes: 2 %
Lymphocytes Relative: 29 %
Lymphs Abs: 2.7 10*3/uL (ref 0.7–4.0)
MCH: 31.4 pg (ref 26.0–34.0)
MCHC: 33.3 g/dL (ref 30.0–36.0)
MCV: 94.2 fL (ref 80.0–100.0)
Monocytes Absolute: 0.7 10*3/uL (ref 0.1–1.0)
Monocytes Relative: 7 %
Neutro Abs: 5.5 10*3/uL (ref 1.7–7.7)
Neutrophils Relative %: 59 %
Platelets: 528 10*3/uL — ABNORMAL HIGH (ref 150–400)
RBC: 4.33 MIL/uL (ref 4.22–5.81)
RDW: 13 % (ref 11.5–15.5)
WBC: 9.4 10*3/uL (ref 4.0–10.5)
nRBC: 0 % (ref 0.0–0.2)

## 2021-11-05 LAB — GLUCOSE, CAPILLARY
Glucose-Capillary: 130 mg/dL — ABNORMAL HIGH (ref 70–99)
Glucose-Capillary: 114 mg/dL — ABNORMAL HIGH (ref 70–99)
Glucose-Capillary: 161 mg/dL — ABNORMAL HIGH (ref 70–99)
Glucose-Capillary: 136 mg/dL — ABNORMAL HIGH (ref 70–99)
Glucose-Capillary: 108 mg/dL — ABNORMAL HIGH (ref 70–99)

## 2021-11-05 LAB — MAGNESIUM: Magnesium: 2.3 mg/dL (ref 1.7–2.4)

## 2021-11-05 LAB — AMMONIA: Ammonia: <10 umol/L (ref 9–35)

## 2021-11-05 LAB — PHOSPHORUS: Phosphorus: 3.7 mg/dL (ref 2.5–4.6)

## 2021-11-05 MED ORDER — THIAMINE MONONITRATE 100 MG PO TABS
100.0000 mg | ORAL_TABLET | Freq: Every day | ORAL | Status: DC
  Administered 2021-11-06 – 2021-11-11 (×6): 100 mg
  Filled 2021-11-05 (×6): qty 1

## 2021-11-05 NOTE — Plan of Care (Signed)
  Problem: Activity: Goal: Risk for activity intolerance will decrease Outcome: Progressing   Problem: Nutrition: Goal: Adequate nutrition will be maintained Outcome: Progressing   Problem: Coping: Goal: Level of anxiety will decrease Outcome: Progressing   Problem: Elimination: Goal: Will not experience complications related to bowel motility Outcome: Progressing Goal: Will not experience complications related to urinary retention Outcome: Progressing   

## 2021-11-05 NOTE — Progress Notes (Signed)
PROGRESS NOTE    Clifford MassedJames Chan  WUJ:[id number] DOB: 12/15/1965 DOA: 10/21/2021 PCP: Cain SaupeFulp, Cammie, MD   Brief Narrative:  56 year old WM PMHx Polysubstance abuse, Hepatitis C,    Presented to [facility] in [place] on 9/6 after being found down.  He was last known well at approximately 13:00 hours that day however EMS was called for wellness check at some time later on.  He was found to be down and unresponsive.  On EMS evaluation the patient was apneic with thready pulse.  He did not responded to intranasal Narcan.  He was bagged in route to the emergency department and subsequently intubated on arrival to the ED.  CT head demonstrated cerebral edema without loss of white matter differentiation.  He was [facility] to [facility].  On 9/11 he was noted to have upper extremity edema on the right.  Ultrasound demonstrated acute thrombosis in the mid and distal basilic vein.  Hospital course also complicated by aspiration pneumonia.  Subsequently [facility] on 9/13.  Events:  9/6 found down > present [facility] ED. CT with bilateral cerebellar edema. 9/7 repeat CT normal Unable to be weaned from sedation 9/11 RUE DVT started on tx dose lovenox 9/13 transfer [facility] 9/14 chest tube placed with intrapleural fibrinolytic administration 9/16 chest tube removed 9/17 patient extubated, remained agitated and started on precedex 9/19 Cortrak placed     Assessment & Plan:   Principal Problem:   Acute encephalopathy Active Problems:   Fever   Pleural effusion   Deep venous thrombosis (HCC)   Acute respiratory failure with hypoxia (HCC)   Cerebrovascular accident (CVA) (HCC)   Protein-calorie malnutrition, severe   Anoxic brain injury (HCC)  Acute toxic metabolic encephalopathy in the setting of ischemic stroke: -On high-dose thiamine -In the setting of acute stroke. -LP performed, meningitis panel was negative. -UDS was positive for  cocaine. -EEG-persistently showed no epileptiform activity -Per neurology high suspicion for anoxic brain injury versus toxin induced encephalopathy and ischemic strokes. Neurology recommend continue with thiamine, no immediate indication for addition of antiplatelets for secondary stroke prevention as patient is on therapeutic Lovenox.  When patient is off of anticoagulation, aspirin should be started.   2-Multifocal cerebral infarcts: -Neurology recommended to start aspirin when patient is off of anticoagulation -Currently on Lovenox for treatment of vein thrombosis.   3-Acute hypoxic respiratory failure secondary to aspiration pneumonia Right-sided aspiration pneumonia with parapneumonic effusion: -Status post chest tube placement with intrapleural lytics on 9/14, chest tube removed 9/16. -Completed 7 days of IV Zosyn -Intubated on admission outside hospital 9/6. -Extubated 9/17. -Currently on Room air.    Superficial vein thrombosis Diagnosed 9/11 with right basilic vein thrombus -Due to increase for progression of thrombosis patient was started on Lovenox after previous hospitalist discussed with oncology Dr. Clelia CroftShadad.  Dysphagia/inadequate oral intake: Patient is being followed by SLP, they have now advanced him to dysphagia 3 diet.  We will monitor closely.   Thrombocytosis: We need to add aspirin once off of anticoagulation. Monitor.    Mild transaminases, history of hepatitis C, improved.   Diarrhea: Likely due to tube feedings.  Resolved.  DVT prophylaxis:   Lovenox   Code Status: Full Code  Family Communication: Discussed with daughter at the bedside yesterday.  Status is: Inpatient Remains inpatient appropriate because: Needs placement.   Estimated body mass index is 22.4 kg/m as calculated from the following:   Height as of this encounter: 5\' 10"  (  1.778 m).   Weight as of this encounter: 70.8 kg.    Nutritional Assessment: Body mass index is 22.4 kg/m.Marland Kitchen Seen  by dietician.  I agree with the assessment and plan as outlined below: Nutrition Status: Nutrition Problem: Severe Malnutrition Etiology: social / environmental circumstances (drug abuse) Signs/Symptoms: severe fat depletion, severe muscle depletion Interventions: Tube feeding  . Skin Assessment: I have examined the patient's skin and I agree with the wound assessment as performed by the wound care RN as outlined below:    Consultants:  Neurology Palliative care  Procedures:  As above  Antimicrobials:  Anti-infectives (From admission, onward)    Start     Dose/Rate Route Frequency Ordered Stop   10/23/21 1000  cefTRIAXone (ROCEPHIN) 2 g in sodium chloride 0.9 % 100 mL IVPB  Status:  Discontinued        2 g 200 mL/hr over 30 Minutes Intravenous Every 24 hours 10/22/21 0036 10/22/21 0037   10/23/21 1000  cefTRIAXone (ROCEPHIN) 2 g in sodium chloride 0.9 % 100 mL IVPB  Status:  Discontinued        2 g 200 mL/hr over 30 Minutes Intravenous Every 24 hours 10/22/21 0037 10/22/21 0827   10/22/21 0930  piperacillin-tazobactam (ZOSYN) IVPB 3.375 g        3.375 g 12.5 mL/hr over 240 Minutes Intravenous Every 8 hours 10/22/21 0833 10/29/21 0559   10/22/21 0915  ceFEPIme (MAXIPIME) 2 g in sodium chloride 0.9 % 100 mL IVPB  Status:  Discontinued        2 g 200 mL/hr over 30 Minutes Intravenous Every 8 hours 10/22/21 0827 10/22/21 0833         Subjective:  Seen and examined.  Has no complaints.  Much more comfortable and calm today.  Daughter at the bedside.  Objective: Vitals:   11/05/21 0311 11/05/21 0424 11/05/21 0744 11/05/21 1214  BP: (!) 124/92  136/71 122/89  Pulse: 94  91 98  Resp: 20  20 18   Temp: 98.2 F (36.8 C)  98.1 F (36.7 C) 98.1 F (36.7 C)  TempSrc: Oral  Axillary Axillary  SpO2: 99%  95% 94%  Weight:  70.8 kg    Height:        Intake/Output Summary (Last 24 hours) at 11/05/2021 1356 Last data filed at 11/05/2021 1317 Gross per 24 hour  Intake --   Output 1700 ml  Net -1700 ml    Filed Weights   11/02/21 0500 11/04/21 0500 11/05/21 0424  Weight: 69.8 kg 71.2 kg 70.8 kg    Examination:  General exam: Appears calm and comfortable  Respiratory system: Clear to auscultation. Respiratory effort normal. Cardiovascular system: S1 & S2 heard, RRR. No JVD, murmurs, rubs, gallops or clicks. No pedal edema. Gastrointestinal system: Abdomen is nondistended, soft and nontender. No organomegaly or masses felt. Normal bowel sounds heard. Central nervous system: Alert but not oriented.  Data Reviewed: I have personally reviewed following labs and imaging studies  CBC: Recent Labs  Lab 11/01/21 0722 11/02/21 0823 11/03/21 0428 11/04/21 0717 11/05/21 0559  WBC 13.3* 11.8* 11.9* 10.2 9.4  NEUTROABS 9.5* 8.6* 8.0* 7.0 5.5  HGB 14.2 14.2 13.8 13.4 13.6  HCT 42.6 41.7 41.7 39.4 40.8  MCV 93.4 94.6 95.9 94.0 94.2  PLT 584* 612* 574* 541* 528*    Basic Metabolic Panel: Recent Labs  Lab 10/31/21 1054 11/01/21 0722 11/02/21 0823 11/03/21 0428 11/04/21 0717 11/05/21 0559  NA 136 138 138 135 138  --  K 4.3 4.1 4.1 4.4 3.8  --   CL 102 103 102 103 101  --   CO2 25 24 24 24 27   --   GLUCOSE 124* 114* 132* 120* 122*  --   BUN 16 20 21* 22* 20  --   CREATININE 0.73 0.77 0.72 0.65 0.79  --   CALCIUM 9.5 9.7 9.7 9.2 9.4  --   MG 2.2 2.2 2.4 2.2 2.3 2.3  PHOS 3.9 3.9 4.2 4.0 4.2 3.7    GFR: Estimated Creatinine Clearance: 102 mL/min (by C-G formula based on SCr of 0.79 mg/dL). Liver Function Tests: Recent Labs  Lab 10/31/21 1054 11/01/21 0722 11/02/21 0823 11/03/21 0428 11/04/21 0717  AST 34 41 39 37 33  ALT 60* 74* 76* 73* 69*  ALKPHOS 118 125 123 113 117  BILITOT 0.4 0.4 0.4 0.4 0.3  PROT 7.3 7.4 7.2 7.1 6.8  ALBUMIN 2.9* 3.0* 3.0* 3.0* 2.9*    No results for input(s): "LIPASE", "AMYLASE" in the last 168 hours. Recent Labs  Lab 11/01/21 0721 11/02/21 0823 11/03/21 0428 11/04/21 0717 11/05/21 0559  AMMONIA 26  28 42* 24 <10    Coagulation Profile: No results for input(s): "INR", "PROTIME" in the last 168 hours. Cardiac Enzymes: Recent Labs  Lab 10/31/21 1054  CKTOTAL 41*  CKMB 1.3    BNP (last 3 results) No results for input(s): "PROBNP" in the last 8760 hours. HbA1C: No results for input(s): "HGBA1C" in the last 72 hours. CBG: Recent Labs  Lab 11/04/21 1549 11/04/21 1939 11/05/21 0112 11/05/21 0347 11/05/21 0753  GLUCAP 112* 114* 130* 114* 161*    Lipid Profile: No results for input(s): "CHOL", "HDL", "LDLCALC", "TRIG", "CHOLHDL", "LDLDIRECT" in the last 72 hours. Thyroid Function Tests: No results for input(s): "TSH", "T4TOTAL", "FREET4", "T3FREE", "THYROIDAB" in the last 72 hours. Anemia Panel: No results for input(s): "VITAMINB12", "FOLATE", "FERRITIN", "TIBC", "IRON", "RETICCTPCT" in the last 72 hours. Sepsis Labs: No results for input(s): "PROCALCITON", "LATICACIDVEN" in the last 168 hours.  Recent Results (from the past 240 hour(s))  Gastrointestinal Panel by PCR , Stool     Status: None   Collection Time: 10/29/21  5:51 PM   Specimen: Stool  Result Value Ref Range Status   Campylobacter species NOT DETECTED NOT DETECTED Final   Plesimonas shigelloides NOT DETECTED NOT DETECTED Final   Salmonella species NOT DETECTED NOT DETECTED Final   Yersinia enterocolitica NOT DETECTED NOT DETECTED Final   Vibrio species NOT DETECTED NOT DETECTED Final   Vibrio cholerae NOT DETECTED NOT DETECTED Final   Enteroaggregative E coli (EAEC) NOT DETECTED NOT DETECTED Final   Enteropathogenic E coli (EPEC) NOT DETECTED NOT DETECTED Final   Enterotoxigenic E coli (ETEC) NOT DETECTED NOT DETECTED Final   Shiga like toxin producing E coli (STEC) NOT DETECTED NOT DETECTED Final   Shigella/Enteroinvasive E coli (EIEC) NOT DETECTED NOT DETECTED Final   Cryptosporidium NOT DETECTED NOT DETECTED Final   Cyclospora cayetanensis NOT DETECTED NOT DETECTED Final   Entamoeba histolytica NOT  DETECTED NOT DETECTED Final   Giardia lamblia NOT DETECTED NOT DETECTED Final   Adenovirus F40/41 NOT DETECTED NOT DETECTED Final   Astrovirus NOT DETECTED NOT DETECTED Final   Norovirus GI/GII NOT DETECTED NOT DETECTED Final   Rotavirus A NOT DETECTED NOT DETECTED Final   Sapovirus (I, II, IV, and V) NOT DETECTED NOT DETECTED Final    Comment: Performed at [facility], [address] [place], [place]  Radiology Studies: DG Swallowing Func-Speech Pathology  Result Date: 11/04/2021 Table formatting from the original result was not included. Images from the original result were not included. Objective Swallowing Evaluation: Type of Study: MBS-Modified Barium Swallow Study  Patient Details Name: [name] MRN: [id number] Date of Birth: August 02, 1965 Today's Date: 11/04/2021 Time: SLP Start Time (ACUTE ONLY): 0830 -SLP Stop Time (ACUTE ONLY): 0900 SLP Time Calculation (min) (ACUTE ONLY): 30 min Past Medical History: Past Medical History: Diagnosis Date  Acid reflux   Head injury   Hepatitis C   Hiatal hernia  Past Surgical History: Past Surgical History: Procedure Laterality Date  ACROMIO-CLAVICULAR JOINT REPAIR Right 06/28/2018  Procedure: ACROMIO-CLAVICULAR JOINT IRRIGATION AND DEBRIDEMENT;  Surgeon: Cammy Copa, MD;  Location: [facility] OR;  Service: Orthopedics;  Laterality: Right;  FACIAL FRACTURE SURGERY    IRRIGATION AND DEBRIDEMENT SHOULDER Right 06/28/2018  Procedure: IRRIGATION AND DEBRIDEMENT SHOULDER;  Surgeon: Cammy Copa, MD;  Location: [facility] OR;  Service: Orthopedics;  Laterality: Right;  KNEE ARTHROSCOPY Left 06/23/2018  Procedure: LEFT KNEE ARTHROSCOPY KNEE I&D.;  Surgeon: Cammy Copa, MD;  Location: [facility] OR;  Service: Orthopedics;  Laterality: Left; HPI: 56 year old male with past medical history significant for polysubstance abuse, hepatitis C, and GERD who presented to [facility] in [place] on 9/6 after being found down. He was bagged in route  to the emergency department at which he arrived around 2300 hrs.  He was intubated immediately upon arrival to the ED 9/6.  CT of the head demonstrated cerebral edema without loss of white matter differentiation.  MRI shows Multifocal, primarily small acute infarcts involving bilateral cerebral hemispheres.. Extubated 9/18.  No data recorded  Recommendations for follow up therapy are one component of a multi-disciplinary discharge planning process, led by the attending physician.  Recommendations may be updated based on patient status, additional functional criteria and insurance authorization. Assessment / Plan / Recommendation   11/04/2021   9:00 AM Clinical Impressions Clinical Impression Pt demonstrates impaired cognition and awareness, but function is better today than all prior sessions. Pt accepted all trials with total assisted feeding, verbalized intermittently in response to staff present. Pt able to take sips of thin liquid with a straw and masticate solids. Oral phase at times prolonged. Pts position facilitated increased safety; his head was in a forward/chin tucked position, allowing any slight delay in swallow initiation to be compensated for well. There were also instances of mild oral residue and spill to pharynx that pt consistently cleared. No penetration or aspiration occurred. Recommend pt initiate a dys 3 soft) diet with thin liquids and total assisted feeding with aspiration precautions, particularly upright posture. Hopeful that access to PO will continue to encourage attention and awareness to environment. Need for ongoing enteral feeding to be determined by pts oral intake. LIkely will need meds crushed in puree. SLP Visit Diagnosis Dysphagia, oropharyngeal phase (R13.12) Impact on safety and function Mild aspiration risk;Risk for inadequate nutrition/hydration     11/04/2021   9:00 AM Treatment Recommendations Treatment Recommendations Therapy as outlined in treatment plan below       No data to display      11/04/2021   9:00 AM Diet Recommendations SLP Diet Recommendations Dysphagia 3 (Mech soft) solids;Thin liquid Liquid Administration via Cup;Straw Medication Administration Crushed with puree Compensations Slow rate;Small sips/bites;Minimize environmental distractions Postural Changes Seated upright at 90 degrees     11/04/2021   9:00 AM Other Recommendations Oral Care Recommendations Oral care BID Other Recommendations Have oral  suction available Assistance recommended at discharge Frequent or constant Supervision/Assistance Functional Status Assessment Patient has had a recent decline in their functional status and demonstrates the ability to make significant improvements in function in a reasonable and predictable amount of time.   11/04/2021   9:00 AM Frequency and Duration  Speech Therapy Frequency (ACUTE ONLY) min 2x/week Treatment Duration 2 weeks     11/04/2021   9:00 AM Oral Phase Oral Phase Impaired Oral - Thin Cup Delayed oral transit Oral - Thin Straw Delayed oral transit Oral - Puree Delayed oral transit Oral - Regular Delayed oral transit;Decreased bolus cohesion    11/04/2021   9:00 AM Pharyngeal Phase Pharyngeal Phase Impaired Pharyngeal- Thin Cup Delayed swallow initiation-vallecula Pharyngeal- Thin Straw Delayed swallow initiation-vallecula Pharyngeal- Puree Delayed swallow initiation-vallecula Pharyngeal- Regular Delayed swallow initiation-vallecula     No data to display    DeBlois, Riley Nearing 11/04/2021, 9:25 AM                      Scheduled Meds:  amLODipine  5 mg Oral Daily   enoxaparin (LOVENOX) injection  70 mg Subcutaneous Q12H   feeding supplement (PROSource TF20)  60 mL Per Tube Daily   fiber supplement (BANATROL TF)  60 mL Per Tube BID   free water  150 mL Per Tube Q4H   melatonin  5 mg Per Tube QHS   mouth rinse  15 mL Mouth Rinse 4 times per day   pantoprazole  40 mg Per Tube Daily   thiamine (VITAMIN B1) injection  100 mg Intravenous Daily    Continuous Infusions:  feeding supplement (JEVITY 1.5 CAL/FIBER) 1,000 mL (11/04/21 0358)     LOS: 15 days   Hughie Closs, MD [facility]  11/05/2021, 1:56 PM   *Please note that this is a verbal dictation therefore any spelling or grammatical errors are due to the "Dragon Medical One" system interpretation.  Please page via Amion and do not message via secure chat for urgent patient care matters. Secure chat can be used for non urgent patient care matters.  How to contact the [facility] Attending or Consulting provider 7A - 7P or covering provider during after hours 7P -7A, for this patient?  Check the care team in [facility] and look for a) attending/consulting TR[facility] provider listed and b) the [facility] team listed. Page or secure chat 7A-7P. Log into [email or web address] and use [facility]'s universal password to access. If you do not have the password, please contact the hospital operator. Locate the [facility] provider you are looking for under [facility] and page to a number that you can be directly reached. If you still have difficulty reaching the provider, please page the [facility] (Director on Call) for the Hospitalists listed on amion for assistance.

## 2021-11-05 NOTE — Progress Notes (Signed)
SLP Cancellation Note  Patient Details Name: [name] MRN: [id number] DOB: 12-15-1965   Cancelled treatment:       Reason Eval/Treat Not Completed: Patient at procedure or test/unavailable (Pt was approached for treatment but, per family (presumably the daughter) pt was with an Radio producer and it was requested that the SLP return later. SLP will follow up as schedule allows.)  [name] I. [name], [place], [place] Office number [phone]  Horton Marshall 11/05/2021, 10:24 AM

## 2021-11-05 NOTE — Progress Notes (Addendum)
Speech Language Pathology Treatment: Dysphagia;Cognitive-Linquistic  Patient Details Name: [name] MRN: [id number] DOB: 04/28/1965 Today's Date: 11/05/2021 Time: 4132-4401 SLP Time Calculation (min) (ACUTE ONLY): 23 min  Assessment / Plan / Recommendation Clinical Impression  Pt was seen for treatment with his sitter present who reported that the pt only ate the banana for breakfast, but did not demonstrate andy signs of aspiration with this. Pt was seen during lunch with a meal that included mashed potatoes, roast beef, carrots, peach cobbler, and thin liquids. Pt tolerated solids without overt s/s of aspiration, but exhibited inconsistent throat clearing with consecutive swallows (mostly over 3 swallows). Pt tolerated individual and up to 3 consecutive swallows of thin liquids without overt s/s of aspiration. Mastication intermittently appeared inadequate for the size and/or nature of the bolus. Oral clearance was [facility]. Pt was restless throughout the session, but this progressed as it continued, and increasing prompts were necessary for attention. Pt mostly communicated with vocalization to indicate yes/no, but he was also noted to state, "I can't do this; I'll get sick", "I think I'll throw up", and "No". No episodes of emesis were noted. There were many instances when the pt vocalized no when he was verbally offered boluses, but eagerly accepted them once they were visually presented. Pt's verbalization/vocalization may not always be congruent with his intent and pt's sitter was advised of this. Pt's current diet will be continued with observance of swallowing precautions. SLP will continue to follow pt.    HPI HPI: 56 year old male with past medical history significant for polysubstance abuse, hepatitis C, and GERD who presented to [facility] in [place] on 9/6 after being found down. He was bagged in route to the emergency department at which he arrived around 2300 hrs.  He was  intubated immediately upon arrival to the ED 9/6.  CT of the head demonstrated cerebral edema without loss of white matter differentiation.  MRI shows Multifocal, primarily small acute infarcts involving bilateral cerebral hemispheres.. Extubated 9/18.      SLP Plan  Continue with current plan of care      Recommendations for follow up therapy are one component of a multi-disciplinary discharge planning process, led by the attending physician.  Recommendations may be updated based on patient status, additional functional criteria and insurance authorization.    Recommendations  Diet recommendations: Dysphagia 3 (mechanical soft);Thin liquid Liquids provided via: Cup;Straw Medication Administration: Crushed with puree Supervision: Trained caregiver to feed patient;Full supervision/cueing for compensatory strategies Compensations: Slow rate;Small sips/bites;Minimize environmental distractions Postural Changes and/or Swallow Maneuvers: Seated upright 90 degrees                Oral Care Recommendations: Oral care QID Follow Up Recommendations: Acute inpatient rehab (3hours/day) Assistance recommended at discharge: Frequent or constant Supervision/Assistance SLP Visit Diagnosis: Cognitive communication deficit (R41.841);Dysphagia, oropharyngeal phase (R13.12) Plan: Continue with current plan of care         Danaysha Kirn I. [name], [place], [place] Office number [phone]   Horton Marshall  11/05/2021, 2:39 PM

## 2021-11-05 NOTE — Progress Notes (Addendum)
Nutrition Follow-up  DOCUMENTATION CODES:  Severe malnutrition in context of social or environmental circumstances  INTERVENTION:  Continue current diet as recommend per SLP Ensure Enlive po TID, each supplement provides 350 kcal and 20 grams of protein. Continue TF via cortrak. Adjust to nocturnal feeds to aim to meet 75% of needs. Jevity 1.5 at 18mL/h (1080L/d) x 12h Prosource TF20 1x/d Free water flush 175mL q4h This provides 1700kcal, 89g protein, and 1739mL free water Banatrol BID via tube  NUTRITION DIAGNOSIS:  Severe Malnutrition related to social / environmental circumstances (drug abuse) as evidenced by severe fat depletion, severe muscle depletion. - remains applicable  GOAL:  Patient will meet greater than or equal to 90% of their needs - progressing  MONITOR:  TF tolerance, Diet advancement, Labs  REASON FOR ASSESSMENT:  Consult Assessment of nutrition requirement/status (Patient with Diarrhea, should we change formula?)  ASSESSMENT:   56 yo male admitted to [facility] after being found down. Transferred to [facility] with concerns for anoxic brain injury (CT showed bilateral cerebellar edema). PMH includes polysubstance abuse, hepatitis C, GERD, smoker.  9/13 - admitted from outside facility intubated 9/14 - TF initiated 9/18 - extubated, SLP eval, recommended NPO 9/19 - cortrak placed 9/27 - MBS, advanced to DYS3/Thins, IR evaluated and found anatomy is amenable to g-tube if needed  Pt sleeping at the time of visit. Lunch tray at bedside untouched. RN reports he did not eat breakfast or lunch this AM but did drink an ensure. Keeps stating he doesn't want anything. Will plan on adjusting TF to nocturnal to see if it will encourage PO intake during the day. If not, pt would likely benefit from a PEG tube placement until intake is adequate to support needs.  Noted that diarrhea has resolved since adjusting TF formula and adding banatrol.   Average Meal  Intake: 9/27-9/29: 25% average intake x 2 recorded meals   Intake/Output Summary (Last 24 hours) at 11/06/2021 1434 Last data filed at 11/06/2021 0817 Gross per 24 hour  Intake 30 ml  Output 800 ml  Net -770 ml  Net IO Since Admission: -8,387.06 mL [11/06/21 1434]  Nutritionally Relevant Medications: Scheduled Meds:  feeding supplement (PROSource TF20)  60 mL Per Tube Daily   fiber supplement (BANATROL TF)  60 mL Per Tube BID   free water  150 mL Per Tube Q4H   pantoprazole  40 mg Per Tube Daily   thiamine  100 mg Per Tube Daily   Continuous Infusions:  feeding supplement (JEVITY 1.5 CAL/FIBER) 1,000 mL (11/04/21 0358)   PRN Meds: docusate sodium, polyethylene glycol   Labs Reviewed: CBG ranges from 112-161mg /dL over the last 24 hours  NUTRITION - FOCUSED PHYSICAL EXAM: Flowsheet Row Most Recent Value  Orbital Region Mild depletion  Upper Arm Region Severe depletion  Thoracic and Lumbar Region Severe depletion  Buccal Region Mild depletion  Temple Region Mild depletion  Clavicle Bone Region Severe depletion  Clavicle and Acromion Bone Region Severe depletion  Scapular Bone Region Mild depletion  Dorsal Hand Severe depletion  Patellar Region Severe depletion  Anterior Thigh Region Severe depletion  Posterior Calf Region Severe depletion  Edema (RD Assessment) None  Hair Reviewed  [tangled, unkept]  Eyes Reviewed  Mouth Reviewed  Skin Reviewed  Nails Reviewed   Diet Order:   Diet Order             DIET DYS 3 Room service appropriate? No; Fluid consistency: Thin  Diet effective now  EDUCATION NEEDS:  Not appropriate for education at this time  Skin:  Skin Assessment: Reviewed RN Assessment  Last BM:  9/26 - type 6  Height:  Ht Readings from Last 1 Encounters:  10/21/21 5\' 10"  (1.778 m)    Weight:  Wt Readings from Last 1 Encounters:  11/06/21 72.5 kg    Ideal Body Weight:  75.5 kg  BMI:  Body mass index is 22.93  kg/m.  Estimated Nutritional Needs:  Kcal:  2200-2400 Protein:  110-120 gm Fluid:  2.2-2.4 L   Ranell Patrick, RD, LDN Clinical Dietitian RD pager # available in [place]  After hours/weekend pager # available in [facility]

## 2021-11-05 NOTE — Progress Notes (Signed)
Physical Therapy Treatment Patient Details Name: [name] MRN: [id number] DOB: 04-Nov-1965 Today's Date: 11/05/2021   History of Present Illness Pt is 56 yo male admitted on to [facility] in [place] on 9/6 after being found unresponsive.  Pt transferred to [facility] on 10/21/21.  Pt with acute toxic metabolic encephalopathy vs encephalitis in pt with acute ischemic strokes per neurology.  9/11 R UE DVT started on lovenox, 9/14-9/16 chest tube, ETT 9/6-9/18.  Hx of polysubstance abuse, hep C, and GERD.    PT Comments    Patient progressing with cognition and mobility.  Increased verbalizations noted though not to external stimuli as much as internal (though did call therapist "Baby").  Patient able to stand at bedside with less assist, but sitting balance remains poor.  Too unsafe for up in chair due to safety issues.  Patient remains appropriate for STSNF level rehab at d/c.   Recommendations for follow up therapy are one component of a multi-disciplinary discharge planning process, led by the attending physician.  Recommendations may be updated based on patient status, additional functional criteria and insurance authorization.  Follow Up Recommendations  Skilled nursing-short term rehab (<3 hours/day) Can patient physically be transported by private vehicle: No   Assistance Recommended at Discharge Frequent or constant Supervision/Assistance  Patient can return home with the following     Equipment Recommendations  Other (comment) (TBA)    Recommendations for Other Services       Precautions / Restrictions Precautions Precautions: Fall Precaution Comments: posey belt, sitter     Mobility  Bed Mobility Overal bed mobility: Needs Assistance Bed Mobility: Supine to Sit, Sit to Supine     Supine to sit: Mod assist Sit to supine: Mod assist, +2 for physical assistance, +2 for safety/equipment   General bed mobility comments: initiating prior to environmental set up to come up to  EOB but then without postural awareness, mod support at best for sitting balance; to supine able to lower trunk on his own, but never moved to pick up his feet to put in the bed despite cues and time so mod A for lifting legs    Transfers Overall transfer level: Needs assistance   Transfers: Sit to/from Stand Sit to Stand: Min assist, +2 physical assistance, From elevated surface           General transfer comment: leaning forward already and LE's pushing into stand, +2 assist for safety and balance; attempted side steps to [facility] and pt shuffled feet couple of times, then sat back on bed    Ambulation/Gait                   Stairs             Wheelchair Mobility    Modified Rankin (Stroke Patients Only) Modified Rankin (Stroke Patients Only) Pre-Morbid Rankin Score: No symptoms Modified Rankin: Severe disability     Balance Overall balance assessment: Needs assistance Sitting-balance support: Feet supported, Bilateral upper extremity supported Sitting balance-Leahy Scale: Zero Sitting balance - Comments: Mod to max A for balance on EOB as pt leaning forward and needing assist to keep head up   Standing balance support: Bilateral upper extremity supported Standing balance-Leahy Scale: Poor Standing balance comment: UE support for balance in standing                            Cognition Arousal/Alertness: Awake/alert Behavior During Therapy: Impulsive, Restless Overall Cognitive Status: Impaired/Different  from baseline Area of Impairment: Following commands, Problem solving, Attention, Safety/judgement, Awareness                   Current Attention Level: Focused   Following Commands: Follows one step commands inconsistently, Follows one step commands with increased time Safety/Judgement: Decreased awareness of safety, Decreased awareness of deficits   Problem Solving: Slow processing, Requires verbal cues, Requires tactile cues,  Decreased initiation, Difficulty sequencing General Comments: mumbling throughout and laughing to himself, some comprehensible words/phrases not in response to questions, but more from internal stimuli        Exercises      General Comments General comments (skin integrity, edema, etc.): daughter and her spouse in the room and supportive. Sitter arrived during session as well      Pertinent Vitals/Pain Pain Assessment Faces Pain Scale: No hurt    Home Living                          Prior Function            PT Goals (current goals can now be found in the care plan section) Acute Rehab PT Goals PT Goal Formulation: With family Time For Goal Achievement: 11/10/21 Potential to Achieve Goals: Fair Progress towards PT goals: Progressing toward goals    Frequency    Min 2X/week      PT Plan Current plan remains appropriate    Co-evaluation              AM-PAC PT "6 Clicks" Mobility   Outcome Measure  Help needed turning from your back to your side while in a flat bed without using bedrails?: A Lot Help needed moving from lying on your back to sitting on the side of a flat bed without using bedrails?: A Lot Help needed moving to and from a bed to a chair (including a wheelchair)?: A Lot Help needed standing up from a chair using your arms (e.g., wheelchair or bedside chair)?: A Lot Help needed to walk in hospital room?: Total Help needed climbing 3-5 steps with a railing? : Total 6 Click Score: 10    End of Session Equipment Utilized During Treatment: Gait belt Activity Tolerance: Patient limited by fatigue Patient left: in bed;with family/visitor present;with nursing/sitter in room;with restraints reapplied;with bed alarm set   PT Visit Diagnosis: Other abnormalities of gait and mobility (R26.89);Muscle weakness (generalized) (M62.81)     Time: 7425-9563 PT Time Calculation (min) (ACUTE ONLY): 27 min  Charges:  $Therapeutic Activity: 23-37  mins                     Magda Kiel, PT Acute Rehabilitation Services Office:[phone] 11/05/2021    Reginia Naas 11/05/2021, 3:59 PM

## 2021-11-06 DIAGNOSIS — Z515 Encounter for palliative care: Secondary | ICD-10-CM | POA: Diagnosis not present

## 2021-11-06 DIAGNOSIS — G9340 Encephalopathy, unspecified: Secondary | ICD-10-CM | POA: Diagnosis not present

## 2021-11-06 LAB — CBC WITH DIFFERENTIAL/PLATELET
Abs Immature Granulocytes: 0.16 10*3/uL — ABNORMAL HIGH (ref 0.00–0.07)
Basophils Absolute: 0.1 10*3/uL (ref 0.0–0.1)
Basophils Relative: 1 %
Eosinophils Absolute: 0.2 10*3/uL (ref 0.0–0.5)
Eosinophils Relative: 2 %
HCT: 40 % (ref 39.0–52.0)
Hemoglobin: 13.5 g/dL (ref 13.0–17.0)
Immature Granulocytes: 2 %
Lymphocytes Relative: 25 %
Lymphs Abs: 2.5 10*3/uL (ref 0.7–4.0)
MCH: 31.9 pg (ref 26.0–34.0)
MCHC: 33.8 g/dL (ref 30.0–36.0)
MCV: 94.6 fL (ref 80.0–100.0)
Monocytes Absolute: 0.8 10*3/uL (ref 0.1–1.0)
Monocytes Relative: 7 %
Neutro Abs: 6.4 10*3/uL (ref 1.7–7.7)
Neutrophils Relative %: 63 %
Platelets: 414 10*3/uL — ABNORMAL HIGH (ref 150–400)
RBC: 4.23 MIL/uL (ref 4.22–5.81)
RDW: 12.9 % (ref 11.5–15.5)
WBC: 10.1 10*3/uL (ref 4.0–10.5)
nRBC: 0 % (ref 0.0–0.2)

## 2021-11-06 LAB — GLUCOSE, CAPILLARY
Glucose-Capillary: 104 mg/dL — ABNORMAL HIGH (ref 70–99)
Glucose-Capillary: 116 mg/dL — ABNORMAL HIGH (ref 70–99)
Glucose-Capillary: 141 mg/dL — ABNORMAL HIGH (ref 70–99)
Glucose-Capillary: 151 mg/dL — ABNORMAL HIGH (ref 70–99)
Glucose-Capillary: 97 mg/dL (ref 70–99)
Glucose-Capillary: 99 mg/dL (ref 70–99)

## 2021-11-06 LAB — PHOSPHORUS: Phosphorus: 4.1 mg/dL (ref 2.5–4.6)

## 2021-11-06 LAB — MAGNESIUM: Magnesium: 2.3 mg/dL (ref 1.7–2.4)

## 2021-11-06 LAB — AMMONIA: Ammonia: 36 umol/L — ABNORMAL HIGH (ref 9–35)

## 2021-11-06 MED ORDER — ENSURE ENLIVE PO LIQD
237.0000 mL | Freq: Three times a day (TID) | ORAL | Status: DC
  Administered 2021-11-06 – 2021-11-20 (×32): 237 mL via ORAL

## 2021-11-06 MED ORDER — LORAZEPAM 2 MG/ML IJ SOLN
INTRAMUSCULAR | Status: AC
  Filled 2021-11-06: qty 1

## 2021-11-06 MED ORDER — ENSURE ENLIVE PO LIQD: 237.0000 mL | Freq: Two times a day (BID) | ORAL | Status: DC

## 2021-11-06 MED ORDER — LORAZEPAM 2 MG/ML IJ SOLN: 1.0000 mg | Freq: Once | INTRAMUSCULAR | Status: DC

## 2021-11-06 MED ORDER — JEVITY 1.5 CAL/FIBER PO LIQD
1000.0000 mL | ORAL | Status: DC
  Administered 2021-11-06 – 2021-11-08 (×3): 1000 mL
  Filled 2021-11-06 (×3): qty 1000

## 2021-11-06 NOTE — Progress Notes (Signed)
PROGRESS NOTE    [name]  [id number] DOB: [birth year] DOA: 10/21/2021 PCP: [name], MD   Brief Narrative:  56 year old WM PMHx Polysubstance abuse, Hepatitis C,    Presented to [facility] in [place] on 9/6 after being found down.  He was last known well at approximately 13:00 hours that day however EMS was called for wellness check at some time later on.  He was found to be down and unresponsive.  On EMS evaluation the patient was apneic with thready pulse.  He did not responded to intranasal Narcan.  He was bagged in route to the emergency department and subsequently intubated on arrival to the ED.  CT head demonstrated cerebral edema without loss of white matter differentiation.  He was transferred to [facility] for MRI.  On 9/11 he was noted to have upper extremity edema on the right.  Ultrasound demonstrated acute thrombosis in the mid and distal basilic vein.  Hospital course also complicated by aspiration pneumonia.  Subsequently transferred to [facility] on 9/13.  Events:  9/6 found down > present to [facility] ED. CT with bilateral cerebellar edema. 9/7 repeat CT normal Unable to be weaned from sedation 9/11 RUE DVT started on tx dose lovenox 9/13 transfer to [facility] for MRI 9/14 chest tube placed with intrapleural fibrinolytic administration 9/16 chest tube removed 9/17 patient extubated, remained agitated and started on precedex 9/19 Cortrak placed     Assessment & Plan:   Principal Problem:   Acute encephalopathy Active Problems:   Fever   Pleural effusion   Deep venous thrombosis (HCC)   Acute respiratory failure with hypoxia (HCC)   Cerebrovascular accident (CVA) ([place])   Protein-calorie malnutrition, severe   Anoxic brain injury ([place])  Acute toxic metabolic encephalopathy in the setting of ischemic stroke: -On high-dose thiamine -In the setting of acute stroke. -LP performed, meningitis panel was negative. -UDS was positive for  cocaine. -EEG-persistently showed no epileptiform activity -Per neurology high suspicion for anoxic brain injury versus toxin induced encephalopathy and ischemic strokes. Neurology recommend continue with thiamine, no immediate indication for addition of antiplatelets for secondary stroke prevention as patient is on therapeutic Lovenox.  When patient is off of anticoagulation, aspirin should be started.   2-Multifocal cerebral infarcts: -Neurology recommended to start aspirin when patient is off of anticoagulation -Currently on Lovenox for treatment of vein thrombosis.   3-Acute hypoxic respiratory failure secondary to aspiration pneumonia Right-sided aspiration pneumonia with parapneumonic effusion: -Status post chest tube placement with intrapleural lytics on 9/14, chest tube removed 9/16. -Completed 7 days of IV Zosyn -Intubated on admission outside hospital 9/6. -Extubated 9/17. -Currently on Room air.    Superficial vein thrombosis Diagnosed 6/29 with right basilic vein thrombus -Due to increase for progression of thrombosis patient was started on Lovenox after previous hospitalist discussed with oncology Dr. Alen Blew.  Dysphagia/inadequate oral intake: Patient is being followed by SLP, they have now advanced him to dysphagia 3 diet.  We will monitor closely.   Thrombocytosis: We need to add aspirin once off of anticoagulation. Monitor.    Mild transaminases, history of hepatitis C, improved.   Diarrhea: Likely due to tube feedings.  Resolved.  DVT prophylaxis:   Lovenox   Code Status: Full Code  Family Communication: None at bedside today.  Status is: Inpatient Remains inpatient appropriate because: Needs placement.   Estimated body mass index is 22.93 kg/m as calculated from the following:   Height as of this encounter: 5\' 10"  (1.778 m).  Weight as of this encounter: 72.5 kg.    Nutritional Assessment: Body mass index is 22.93 kg/m.Mar[place] Kitchen Seen by dietician.  I agree  with the assessment and plan as outlined below: Nutrition Status: Nutrition Problem: Severe Malnutrition Etiology: social / environmental circumstances (drug abuse) Signs/Symptoms: severe fat depletion, severe muscle depletion Interventions: Tube feeding  . Skin Assessment: I have examined the patient's skin and I agree with the wound assessment as performed by the wound care RN as outlined below:    Consultants:  Neurology Palliative care  Procedures:  As above  Antimicrobials:  Anti-infectives (From admission, onward)    Start     Dose/Rate Route Frequency Ordered Stop   10/23/21 1000  cefTRIAXone (ROCEPHIN) 2 g in sodium chloride 0.9 % 100 mL IVPB  Status:  Discontinued        2 g 200 mL/hr over 30 Minutes Intravenous Every 24 hours 10/22/21 0036 10/22/21 0037   10/23/21 1000  cefTRIAXone (ROCEPHIN) 2 g in sodium chloride 0.9 % 100 mL IVPB  Status:  Discontinued        2 g 200 mL/hr over 30 Minutes Intravenous Every 24 hours 10/22/21 0037 10/22/21 0827   10/22/21 0930  piperacillin-tazobactam (ZOSYN) IVPB 3.375 g        3.375 g 12.5 mL/hr over 240 Minutes Intravenous Every 8 hours 10/22/21 0833 10/29/21 0559   10/22/21 0915  ceFEPIme (MAXIPIME) 2 g in sodium chloride 0.9 % 100 mL IVPB  Status:  Discontinued        2 g 200 mL/hr over 30 Minutes Intravenous Every 8 hours 10/22/21 0827 10/22/21 0833         Subjective:  Patient seen and examined.  Alert but not oriented.  Status quo.   Objective: Vitals:   11/06/21 0308 11/06/21 0500 11/06/21 0816 11/06/21 1112  BP: 109/84  120/85 (!) 141/87  Pulse: 87  (!) 104 100  Resp: 18  18 18   Temp: 98.3 F (36.8 C)  97.6 F (36.4 C) 98.7 F (37.1 C)  TempSrc: Oral  Oral Oral  SpO2: 97%  99% 99%  Weight:  72.5 kg    Height:        Intake/Output Summary (Last 24 hours) at 11/06/2021 1126 Last data filed at 11/06/2021 0817 Gross per 24 hour  Intake 230 ml  Output 1300 ml  Net -1070 ml    Filed Weights   11/04/21  0500 11/05/21 0424 11/06/21 0500  Weight: 71.2 kg 70.8 kg 72.5 kg    Examination:  General exam: Appears calm and comfortable  Respiratory system: Clear to auscultation. Respiratory effort normal. Cardiovascular system: S1 & S2 heard, RRR. No JVD, murmurs, rubs, gallops or clicks. No pedal edema. Gastrointestinal system: Abdomen is nondistended, soft and nontender. No organomegaly or masses felt. Normal bowel sounds heard. Central nervous system: Alert but not oriented.  Data Reviewed: I have personally reviewed following labs and imaging studies  CBC: Recent Labs  Lab 11/02/21 0823 11/03/21 0428 11/04/21 0717 11/05/21 0559 11/06/21 0451  WBC 11.8* 11.9* 10.2 9.4 10.1  NEUTROABS 8.6* 8.0* 7.0 5.5 6.4  HGB 14.2 13.8 13.4 13.6 13.5  HCT 41.7 41.7 39.4 40.8 40.0  MCV 94.6 95.9 94.0 94.2 94.6  PLT 612* 574* 541* 528* 414*    Basic Metabolic Panel: Recent Labs  Lab 10/31/21 1054 11/01/21 0722 11/02/21 0823 11/03/21 0428 11/04/21 0717 11/05/21 0559 11/06/21 0451  NA 136 138 138 135 138  --   --   K 4.3 4.1  4.1 4.4 3.8  --   --   CL 102 103 102 103 101  --   --   CO2 25 24 24 24 27   --   --   GLUCOSE 124* 114* 132* 120* 122*  --   --   BUN 16 20 21* 22* 20  --   --   CREATININE 0.73 0.77 0.72 0.65 0.79  --   --   CALCIUM 9.5 9.7 9.7 9.2 9.4  --   --   MG 2.2 2.2 2.4 2.2 2.3 2.3 2.3  PHOS 3.9 3.9 4.2 4.0 4.2 3.7 4.1    GFR: Estimated Creatinine Clearance: 104.5 mL/min (by C-G formula based on SCr of 0.79 mg/dL). Liver Function Tests: Recent Labs  Lab 10/31/21 1054 11/01/21 0722 11/02/21 0823 11/03/21 0428 11/04/21 0717  AST 34 41 39 37 33  ALT 60* 74* 76* 73* 69*  ALKPHOS 118 125 123 113 117  BILITOT 0.4 0.4 0.4 0.4 0.3  PROT 7.3 7.4 7.2 7.1 6.8  ALBUMIN 2.9* 3.0* 3.0* 3.0* 2.9*    No results for input(s): "LIPASE", "AMYLASE" in the last 168 hours. Recent Labs  Lab 11/02/21 0823 11/03/21 0428 11/04/21 0717 11/05/21 0559 11/06/21 0451  AMMONIA 28  42* 24 <10 36*    Coagulation Profile: No results for input(s): "INR", "PROTIME" in the last 168 hours. Cardiac Enzymes: Recent Labs  Lab 10/31/21 1054  CKTOTAL 41*  CKMB 1.3    BNP (last 3 results) No results for input(s): "PROBNP" in the last 8760 hours. HbA1C: No results for input(s): "HGBA1C" in the last 72 hours. CBG: Recent Labs  Lab 11/05/21 1400 11/05/21 1920 11/06/21 0006 11/06/21 0406 11/06/21 0808  GLUCAP 136* 108* 141* 151* 99    Lipid Profile: No results for input(s): "CHOL", "HDL", "LDLCALC", "TRIG", "CHOLHDL", "LDLDIRECT" in the last 72 hours. Thyroid Function Tests: No results for input(s): "TSH", "T4TOTAL", "FREET4", "T3FREE", "THYROIDAB" in the last 72 hours. Anemia Panel: No results for input(s): "VITAMINB12", "FOLATE", "FERRITIN", "TIBC", "IRON", "RETICCTPCT" in the last 72 hours. Sepsis Labs: No results for input(s): "PROCALCITON", "LATICACIDVEN" in the last 168 hours.  Recent Results (from the past 240 hour(s))  Gastrointestinal Panel by PCR , Stool     Status: None   Collection Time: 10/29/21  5:51 PM   Specimen: Stool  Result Value Ref Range Status   Campylobacter species NOT DETECTED NOT DETECTED Final   Plesimonas shigelloides NOT DETECTED NOT DETECTED Final   Salmonella species NOT DETECTED NOT DETECTED Final   Yersinia enterocolitica NOT DETECTED NOT DETECTED Final   Vibrio species NOT DETECTED NOT DETECTED Final   Vibrio cholerae NOT DETECTED NOT DETECTED Final   Enteroaggregative E coli (EAEC) NOT DETECTED NOT DETECTED Final   Enteropathogenic E coli (EPEC) NOT DETECTED NOT DETECTED Final   Enterotoxigenic E coli (ETEC) NOT DETECTED NOT DETECTED Final   Shiga like toxin producing E coli (STEC) NOT DETECTED NOT DETECTED Final   Shigella/Enteroinvasive E coli (EIEC) NOT DETECTED NOT DETECTED Final   Cryptosporidium NOT DETECTED NOT DETECTED Final   Cyclospora cayetanensis NOT DETECTED NOT DETECTED Final   Entamoeba histolytica NOT  DETECTED NOT DETECTED Final   Giardia lamblia NOT DETECTED NOT DETECTED Final   Adenovirus F40/41 NOT DETECTED NOT DETECTED Final   Astrovirus NOT DETECTED NOT DETECTED Final   Norovirus GI/GII NOT DETECTED NOT DETECTED Final   Rotavirus A NOT DETECTED NOT DETECTED Final   Sapovirus (I, II, IV, and V) NOT DETECTED NOT DETECTED Final  Comment: Performed at [facility], [address], [place], [place] [address]     Radiology Studies: No results found.  Scheduled Meds:  amLODipine  5 mg Oral Daily   enoxaparin (LOVENOX) injection  70 mg Subcutaneous Q12H   feeding supplement (PROSource TF20)  60 mL Per Tube Daily   fiber supplement (BANATROL TF)  60 mL Per Tube BID   free water  150 mL Per Tube Q4H   LORazepam  1 mg Intravenous Once   melatonin  5 mg Per Tube QHS   mouth rinse  15 mL Mouth Rinse 4 times per day   pantoprazole  40 mg Per Tube Daily   thiamine  100 mg Per Tube Daily   Continuous Infusions:  feeding supplement (JEVITY 1.5 CAL/FIBER) 1,000 mL (11/04/21 0358)     LOS: 16 days   Hughie Closs, MD [facility]  11/06/2021, 11:26 AM   *Please note that this is a verbal dictation therefore any spelling or grammatical errors are due to the "Dragon Medical One" system interpretation.  Please page via Amion and do not message via secure chat for urgent patient care matters. Secure chat can be used for non urgent patient care matters.  How to contact the [facility] Attending or Consulting provider 7A - 7P or covering provider during after hours 7P -7A, for this patient?  Check the care team in [facility] and look for a) attending/consulting [facility] provider listed and b) the [facility] team listed. Page or secure chat 7A-7P. Log into [email or web address] and use [facility]'s universal password to access. If you do not have the password, please contact the hospital operator. Locate the [facility] provider you are looking for under [facility] and page to a number that you can be  directly reached. If you still have difficulty reaching the provider, please page the [facility] (Director on Call) for the Hospitalists listed on amion for assistance.

## 2021-11-06 NOTE — Progress Notes (Signed)
Palliative Medicine Inpatient Follow Up Note  HPI: 56 year old WM PMHx Polysubstance abuse, Hepatitis C, and GERD.   Presented to [facility] in [place] on 9/6 after being found down.  He was last known well at approximately 13:00 hours that day however EMS was called for wellness check at some time later on.  He was found to be down and unresponsive.  On EMS evaluation the patient was apneic with thready pulse.  He did not responded to intranasal Narcan.  He was bagged in route to the emergency department and subsequently intubated on arrival to the ED.  CT head demonstrated cerebral edema without loss of white matter differentiation.  He was transferred to [facility] for MRI.  On 9/11 he was noted to have upper extremity edema on the right.  Ultrasound demonstrated acute thrombosis in the mid and distal basilic vein.  Hospital course also complicated by aspiration pneumonia.  Subsequently transferred to [facility] on 9/13.     Palliative care was asked to get involved to further address goals of care in the setting of an acute stroke.  Today's Discussion 11/06/2021  *Please note that this is a verbal dictation therefore any spelling or grammatical errors are due to the "[employer] [employer]" system interpretation.  Chart reviewed inclusive of vital signs, progress notes, laboratory results, and diagnostic images.  I met with Jeneen Rinks at bedside this morning. He is resting comfortably and does arouse to me. He is not very verbal this morning and not able to follow commands.   I spoke to patients daughter, [name] this afternoon. She expresses that she was able to speak to the speech and rehab medicine teams this week. She shares that her interactions with the primary medical team have been fairly short.   [name] expresses that she has spoken to speech and realizes PEG may be needed moving forward. She continues to hope for patient to be optimized as best as possible prior to  further consideration. [name] feels [name] will continue to intake more over time.   [name] again emphasizes the hope for [name] to go to an acute rehabilitation She expresses a variety of concerns as they relate to the patient going to a skilled nursing facility and losing all of the gains he has made to date. She shares that she never though he would get to this point and remain optimistic for continued improvements. We discussed that I would bring her concerns to the [facility] team who can further aid and support placement.   Goals remain for improvement and a degree of recovery.  The Palliative team will continue supporting [name] during this time.   Objective Assessment: Vital Signs Vitals:   11/06/21 0816 11/06/21 1112  BP: 120/85 (!) 141/87  Pulse: (!) 104 100  Resp: 18 18  Temp: 97.6 F (36.4 C) 98.7 F (37.1 C)  SpO2: 99% 99%    Intake/Output Summary (Last 24 hours) at 11/06/2021 1450 Last data filed at 11/06/2021 5170 Gross per 24 hour  Intake 30 ml  Output 800 ml  Net -770 ml   Gen: Older Caucasian male in no acute distress HEENT: Cortrack in place, dry mucous membranes CV: Irregular rate and regular rhythm  PULM: On room air breathing is even and nonlabored ABD: soft/nontender  EXT: No edema  Neuro: Awakens though does not follow commands  SUMMARY OF RECOMMENDATIONS   Full code/full scope of care    Patients daughter is open to the idea of a gastrostomy tube  though would first like patient to be optimized from a speech perspective and to enable more time to see how much nutrition he can take in on his own   Patient's daughter would like for him to go to acute rehabilitation   Safety concerns brought to leadership regarding patients two girlfriends (on 9/23) which are continued to be reinforced   Of note the only person to receive any information on Phil is his daughter [name] and her partner Theodoro Doing   Palliative care will continue to support [name] and  his family throughout his complex hospitalization   Billing based on MDM: High ______________________________________________________________________________________ [place] Team Team Cell Phone: [phone] Please utilize secure chat with additional questions, if there is no response within 30 minutes please call the above phone number   Palliative Medicine Team providers are available by phone from 7am to 7pm daily and can be reached through the team cell phone.  Should this patient require assistance outside of these hours, please call the patient's attending physician.

## 2021-11-06 NOTE — Progress Notes (Signed)
Occupational Therapy Treatment Patient Details Name: [name] MRN: [id number] DOB: 1965/11/03 Today's Date: 11/06/2021   History of present illness Pt is 56 yo male admitted on to [facility] in [place] on 9/6 after being found unresponsive.  Pt transferred to [facility] on 10/21/21.  Pt with acute toxic metabolic encephalopathy vs encephalitis in pt with acute ischemic strokes per neurology.  9/11 R UE DVT started on lovenox, 9/14-9/16 chest tube, ETT 9/6-9/18.  Hx of polysubstance abuse, hep C, and GERD.   OT comments  Session today focused on following one step commands, functional mobility/transfers, and sitting balance. Pt with little to no initiation of any movement other than getting back in bed following standing EOB. He required max multimodal cueing to come to EOB and to stand with mod A +2. He did demonstrate some trunk righting reactions sitting and standing briefly. He made little to no eye contact with OT and did not track staff around room. He declined any PO intake offered to him. No verbalizations this session despite attempts to engage. Attempted to call his daughter for update but was unable to reach her. Pt would benefit from SNF level f/u. Acute OT will continue to follow and assess POC.    Recommendations for follow up therapy are one component of a multi-disciplinary discharge planning process, led by the attending physician.  Recommendations may be updated based on patient status, additional functional criteria and insurance authorization.    Follow Up Recommendations  Skilled nursing-short term rehab (<3 hours/day)    Assistance Recommended at Discharge Frequent or constant Supervision/Assistance  Patient can return home with the following  Two people to help with walking and/or transfers;A lot of help with bathing/dressing/bathroom;Assistance with cooking/housework;Assistance with feeding;Direct supervision/assist for medications management;Direct supervision/assist for  financial management;Assist for transportation;Help with stairs or ramp for entrance   Equipment Recommendations   (defer to next venue)    Recommendations for Other Services      Precautions / Restrictions Precautions Precautions: Fall Precaution Comments: posey belt Restrictions Weight Bearing Restrictions: No       Mobility Bed Mobility Overal bed mobility: Needs Assistance Bed Mobility: Supine to Sit, Sit to Supine Rolling: Mod assist, +2 for physical assistance, +2 for safety/equipment   Supine to sit: Max assist Sit to supine: Max assist   General bed mobility comments: Very poor initiation to come EOB, was able to right his trunk once OT provided manual assist and tactile cueing    Transfers Overall transfer level: Needs assistance Equipment used: 2 person hand held assist Transfers: Sit to/from Stand Sit to Stand: Mod assist, +2 physical assistance           General transfer comment: He required mod +2 for initiation of movement. Unable to follow any commands once standing with poor head and neck control or to weight shift to take any steps. He began actively resisting standing and attempting to get back into bed     Balance Overall balance assessment: Needs assistance Sitting-balance support: Feet supported, Bilateral upper extremity supported Sitting balance-Leahy Scale: Zero Sitting balance - Comments: Mod to max A for sitting balance with poor head control Postural control: Posterior lean Standing balance support: Bilateral upper extremity supported Standing balance-Leahy Scale: Zero                             ADL either performed or assessed with clinical judgement    Extremity/Trunk Assessment Upper Extremity Assessment Upper Extremity  Assessment: Difficult to assess due to impaired cognition                  Praxis Praxis Praxis: Impaired Praxis Impairment Details: Ideation;Initiation;Ideomotor;Motor planning;Perseveration     Cognition Arousal/Alertness: Awake/alert Behavior During Therapy: Impulsive, Restless Overall Cognitive Status: Impaired/Different from baseline Area of Impairment: Following commands, Problem solving, Attention, Safety/judgement, Awareness                   Current Attention Level: Focused   Following Commands: Follows one step commands inconsistently, Follows one step commands with increased time Safety/Judgement: Decreased awareness of safety, Decreased awareness of deficits Awareness: Intellectual (very little awareness of environment) Problem Solving: Slow processing, Decreased initiation, Difficulty sequencing General Comments: Pt with little to no initiation of all commands                   Pertinent Vitals/ Pain       Pain Assessment Pain Assessment: Faces Faces Pain Scale: No hurt         Frequency  Min 2X/week        Progress Toward Goals  OT Goals(current goals can now be found in the care plan section)  Progress towards OT goals: OT to reassess next treatment  Acute Rehab OT Goals Patient Stated Goal: none stated OT Goal Formulation: Patient unable to participate in goal setting Time For Goal Achievement: 11/11/21 Potential to Achieve Goals: Poor  Plan Discharge plan remains appropriate       AM-PAC OT "6 Clicks" Daily Activity     Outcome Measure   Help from another person eating meals?: Total Help from another person taking care of personal grooming?: Total Help from another person toileting, which includes using toliet, bedpan, or urinal?: Total Help from another person bathing (including washing, rinsing, drying)?: Total Help from another person to put on and taking off regular upper body clothing?: Total Help from another person to put on and taking off regular lower body clothing?: Total 6 Click Score: 6    End of Session Equipment Utilized During Treatment: Gait belt  OT Visit Diagnosis: Other symptoms and signs involving  cognitive function;Muscle weakness (generalized) (M62.81);Other abnormalities of gait and mobility (R26.89)   Activity Tolerance Patient limited by fatigue   Patient Left in bed;with call bell/phone within reach;with bed alarm set   Nurse Communication Mobility status        Time: [id number] OT Time Calculation (min): 17 min  Charges: OT General Charges $OT Visit: 1 Visit OT Treatments $Therapeutic Activity: 8-22 mins  Laverle Hobby, OTR/L, CBIS Acute Rehab Office: [place] 11/06/2021, 3:00 PM

## 2021-11-07 DIAGNOSIS — Z515 Encounter for palliative care: Secondary | ICD-10-CM | POA: Diagnosis not present

## 2021-11-07 DIAGNOSIS — G9340 Encephalopathy, unspecified: Secondary | ICD-10-CM | POA: Diagnosis not present

## 2021-11-07 LAB — GLUCOSE, CAPILLARY
Glucose-Capillary: 111 mg/dL — ABNORMAL HIGH (ref 70–99)
Glucose-Capillary: 117 mg/dL — ABNORMAL HIGH (ref 70–99)
Glucose-Capillary: 118 mg/dL — ABNORMAL HIGH (ref 70–99)
Glucose-Capillary: 123 mg/dL — ABNORMAL HIGH (ref 70–99)
Glucose-Capillary: 141 mg/dL — ABNORMAL HIGH (ref 70–99)
Glucose-Capillary: 159 mg/dL — ABNORMAL HIGH (ref 70–99)
Glucose-Capillary: 98 mg/dL (ref 70–99)

## 2021-11-07 LAB — AMMONIA: Ammonia: 46 umol/L — ABNORMAL HIGH (ref 9–35)

## 2021-11-07 NOTE — Progress Notes (Signed)
Palliative Medicine Inpatient Follow Up Note HPI: 56 year old WM PMHx Polysubstance abuse, Hepatitis C, and GERD.   Presented to [facility] in [place] on 9/6 after being found down.  He was last known well at approximately 13:00 hours that day however EMS was called for wellness check at some time later on.  He was found to be down and unresponsive.  On EMS evaluation the patient was apneic with thready pulse.  He did not responded to intranasal Narcan.  He was bagged in route to the emergency department and subsequently intubated on arrival to the ED.  CT head demonstrated cerebral edema without loss of white matter differentiation.  He was transferred to [facility] for MRI.  On 9/11 he was noted to have upper extremity edema on the right.  Ultrasound demonstrated acute thrombosis in the mid and distal basilic vein.  Hospital course also complicated by aspiration pneumonia.  Subsequently transferred to [facility] on 9/13.     Palliative care was asked to get involved to further address goals of care in the setting of an acute stroke.  Today's Discussion 11/07/2021  *Please note that this is a verbal dictation therefore any spelling or grammatical errors are due to the "[place] One" system interpretation.  Chart reviewed inclusive of vital signs, progress notes, laboratory results, and diagnostic images.  I spoke to patients RN, Davy Pique who shares that Monty was asking for his daughter last night and tearful. She shares that they were able to coordinate a facetime video which was helpful.  I met with Jeneen Rinks at bedside this morning and he was able to open his eyes and mutter a few words. He did not follow the directions on squeezing my hands. He otherwise was not in any notable distress.   I spoke to patients daughter, [name] over the phone. We reviewed the plan for inquiries to be sent to acute rehabilitation in the oncoming week. We reviewed that if for any reason [facility]  is not accepted to CIR that there are other acute rehabilitation options throughout the area which may also be worth looking into.   Discussed that Dewitte may take some time to build up an appetite and that the hospital food is not terribly appealing to him. He tends to prefer [place] food such as chicken and mashed potatoes. [name] plans to bring this when she visits later this week.   Additionally, d/t an ongoing investigation in [place] involving Tej permission has been granted by [name] for Lavonda Jumbo - an investigator to come into the hospital and speak to [place].We reviewed the importance of being aware of this in the setting of patients  secure chart and prior privacy concerns.   The Palliative team will continue supporting Issaac during this time.   Objective Assessment: Vital Signs Vitals:   11/07/21 0337 11/07/21 0741  BP: (!) 122/98 (!) 123/96  Pulse: 99 92  Resp: 14 16  Temp: 98.4 F (36.9 C)   SpO2: 99% 99%    Intake/Output Summary (Last 24 hours) at 11/07/2021 0956 Last data filed at 11/07/2021 4650 Gross per 24 hour  Intake 140 ml  Output 1000 ml  Net -860 ml   Gen: Older Caucasian male in no acute distress HEENT: Cortrack in place, dry mucous membranes CV: Irregular rate and regular rhythm  PULM: On room air breathing is even and nonlabored ABD: soft/nontender  EXT: No edema  Neuro: Awakens though does not follow commands  SUMMARY OF RECOMMENDATIONS  Full code/Full scope of care    Patients daughter is open to the idea of a gastrostomy tube --> though would first like patient to be optimized from a speech perspective and to enable more time to see how much nutrition he can take in on his own   Patient's daughter would like for him to go to acute rehabilitation --> Discussed if not approved for CIR expanding the search    Safety concerns brought to leadership regarding patients two girlfriends (on 9/23) which are continued to be reinforced   Of note the  only people to receive any information on Nicki is his daughter Micky Sheller and  her partner Theodoro Doing  Permission has been granted for investigator Lavonda Jumbo to visit with the patient at [place] care will continue to support Gabor and his family throughout his complex hospitalization   Billing based on MDM: High ______________________________________________________________________________________ [place] Team Team Cell Phone: [phone] Please utilize secure chat with additional questions, if there is no response within 30 minutes please call the above phone number   Palliative Medicine Team providers are available by phone from 7am to 7pm daily and can be reached through the team cell phone.  Should this patient require assistance outside of these hours, please call the patient's attending physician.

## 2021-11-07 NOTE — Plan of Care (Signed)
  Problem: Activity: Goal: Risk for activity intolerance will decrease Outcome: Progressing   Problem: Nutrition: Goal: Adequate nutrition will be maintained Outcome: Progressing   Problem: Coping: Goal: Level of anxiety will decrease Outcome: Progressing   Problem: Elimination: Goal: Will not experience complications related to bowel motility Outcome: Progressing   Problem: Safety: Goal: Ability to remain free from injury will improve Outcome: Progressing   Problem: Skin Integrity: Goal: Risk for impaired skin integrity will decrease Outcome: Progressing   

## 2021-11-07 NOTE — Plan of Care (Signed)

## 2021-11-07 NOTE — Progress Notes (Signed)
PROGRESS NOTE    [name]  TKP:[id number] DOB: July 25, 1965 DOA: 10/21/2021 PCP: [name], MD   Brief Narrative:  56 year old WM PMHx Polysubstance abuse, Hepatitis C,    Presented to [facility] in [place] on 9/6 after being found down.  He was last known well at approximately 13:00 hours that day however EMS was called for wellness check at some time later on.  He was found to be down and unresponsive.  On EMS evaluation the patient was apneic with thready pulse.  He did not responded to intranasal Narcan.  He was bagged in route to the emergency department and subsequently intubated on arrival to the ED.  CT head demonstrated cerebral edema without loss of white matter differentiation.  He was transferred to [facility] for MRI.  On 9/11 he was noted to have upper extremity edema on the right.  Ultrasound demonstrated acute thrombosis in the mid and distal basilic vein.  Hospital course also complicated by aspiration pneumonia.  Subsequently transferred to [facility] on 9/13.  Events:  9/6 found down > present to [facility] ED. CT with bilateral cerebellar edema. 9/7 repeat CT normal Unable to be weaned from sedation 9/11 RUE DVT started on tx dose lovenox 9/13 transfer to [facility] for MRI 9/14 chest tube placed with intrapleural fibrinolytic administration 9/16 chest tube removed 9/17 patient extubated, remained agitated and started on precedex 9/19 Cortrak placed     Assessment & Plan:   Principal Problem:   Acute encephalopathy Active Problems:   Fever   Pleural effusion   Deep venous thrombosis (HCC)   Acute respiratory failure with hypoxia (HCC)   Cerebrovascular accident (CVA) (HCC)   Protein-calorie malnutrition, severe   Anoxic brain injury (HCC)  Acute toxic metabolic encephalopathy in the setting of ischemic stroke: -On high-dose thiamine -In the setting of acute stroke. -LP performed, meningitis panel was negative. -UDS was positive for  cocaine. -EEG-persistently showed no epileptiform activity -Per neurology high suspicion for anoxic brain injury versus toxin induced encephalopathy and ischemic strokes. Neurology recommend continue with thiamine, no immediate indication for addition of antiplatelets for secondary stroke prevention as patient is on therapeutic Lovenox.  When patient is off of anticoagulation, aspirin should be started.   2-Multifocal cerebral infarcts: -Neurology recommended to start aspirin when patient is off of anticoagulation -Currently on Lovenox for treatment of vein thrombosis.   3-Acute hypoxic respiratory failure secondary to aspiration pneumonia Right-sided aspiration pneumonia with parapneumonic effusion: -Status post chest tube placement with intrapleural lytics on 9/14, chest tube removed 9/16. -Completed 7 days of IV Zosyn -Intubated on admission outside hospital 9/6. -Extubated 9/17. -Currently on Room air.    Superficial vein thrombosis Diagnosed 9/11 with right basilic vein thrombus -Due to increase for progression of thrombosis patient was started on Lovenox after previous hospitalist discussed with oncology Dr. Clelia Croft.  Dysphagia/nutrition: Patient is being followed by SLP, they have now advanced him to dysphagia 3 diet. Tube was removed on 11/05/21.  Encourage p.o. intake.   Thrombocytosis: We need to add aspirin once off of anticoagulation. Monitor.    Mild transaminases, history of hepatitis C, improved.   Diarrhea: Likely due to tube feedings.  Resolved.  DVT prophylaxis:   Lovenox   Code Status: Full Code  Family Communication: None at bedside today.  Status is: Inpatient Remains inpatient appropriate because: Needs placement.   Estimated body mass index is 21.98 kg/m as calculated from the following:   Height as of this encounter: 5\' 10"  (1.778  m).   Weight as of this encounter: 69.5 kg.    Nutritional Assessment: Body mass index is 21.98 kg/m.Marland Kitchen Seen by  dietician.  I agree with the assessment and plan as outlined below: Nutrition Status: Nutrition Problem: Severe Malnutrition Etiology: social / environmental circumstances (drug abuse) Signs/Symptoms: severe fat depletion, severe muscle depletion Interventions: Tube feeding  . Skin Assessment: I have examined the patient's skin and I agree with the wound assessment as performed by the wound care RN as outlined below:    Consultants:  Neurology Palliative care  Procedures:  As above  Antimicrobials:  Anti-infectives (From admission, onward)    Start     Dose/Rate Route Frequency Ordered Stop   10/23/21 1000  cefTRIAXone (ROCEPHIN) 2 g in sodium chloride 0.9 % 100 mL IVPB  Status:  Discontinued        2 g 200 mL/hr over 30 Minutes Intravenous Every 24 hours 10/22/21 0036 10/22/21 0037   10/23/21 1000  cefTRIAXone (ROCEPHIN) 2 g in sodium chloride 0.9 % 100 mL IVPB  Status:  Discontinued        2 g 200 mL/hr over 30 Minutes Intravenous Every 24 hours 10/22/21 0037 10/22/21 0827   10/22/21 0930  piperacillin-tazobactam (ZOSYN) IVPB 3.375 g        3.375 g 12.5 mL/hr over 240 Minutes Intravenous Every 8 hours 10/22/21 0833 10/29/21 0559   10/22/21 0915  ceFEPIme (MAXIPIME) 2 g in sodium chloride 0.9 % 100 mL IVPB  Status:  Discontinued        2 g 200 mL/hr over 30 Minutes Intravenous Every 8 hours 10/22/21 0827 10/22/21 0833         Subjective:  Seen and examined.  Alert but not oriented.  No change.   Objective: Vitals:   11/07/21 0029 11/07/21 0337 11/07/21 0500 11/07/21 0741  BP: (!) 159/91 (!) 122/98  (!) 123/96  Pulse: 100 99  92  Resp: 14 14  16   Temp: 98 F (36.7 C) 98.4 F (36.9 C)    TempSrc: Axillary Axillary    SpO2: 99% 99%  99%  Weight:   69.5 kg   Height:        Intake/Output Summary (Last 24 hours) at 11/07/2021 1018 Last data filed at 11/07/2021 0814 Gross per 24 hour  Intake 140 ml  Output 1000 ml  Net -860 ml    Filed Weights   11/05/21  0424 11/06/21 0500 11/07/21 0500  Weight: 70.8 kg 72.5 kg 69.5 kg    Examination:  General exam: Appears calm and comfortable  Respiratory system: Clear to auscultation. Respiratory effort normal. Cardiovascular system: S1 & S2 heard, RRR. No JVD, murmurs, rubs, gallops or clicks. No pedal edema. Gastrointestinal system: Abdomen is nondistended, soft and nontender. No organomegaly or masses felt. Normal bowel sounds heard. Central nervous system: Alert but not oriented.  Data Reviewed: I have personally reviewed following labs and imaging studies  CBC: Recent Labs  Lab 11/02/21 0823 11/03/21 0428 11/04/21 0717 11/05/21 0559 11/06/21 0451  WBC 11.8* 11.9* 10.2 9.4 10.1  NEUTROABS 8.6* 8.0* 7.0 5.5 6.4  HGB 14.2 13.8 13.4 13.6 13.5  HCT 41.7 41.7 39.4 40.8 40.0  MCV 94.6 95.9 94.0 94.2 94.6  PLT 612* 574* 541* 528* 414*    Basic Metabolic Panel: Recent Labs  Lab 10/31/21 1054 11/01/21 0722 11/02/21 0823 11/03/21 0428 11/04/21 0717 11/05/21 0559 11/06/21 0451  NA 136 138 138 135 138  --   --   K 4.3 4.1  4.1 4.4 3.8  --   --   CL 102 103 102 103 101  --   --   CO2 25 24 24 24 27   --   --   GLUCOSE 124* 114* 132* 120* 122*  --   --   BUN 16 20 21* 22* 20  --   --   CREATININE 0.73 0.77 0.72 0.65 0.79  --   --   CALCIUM 9.5 9.7 9.7 9.2 9.4  --   --   MG 2.2 2.2 2.4 2.2 2.3 2.3 2.3  PHOS 3.9 3.9 4.2 4.0 4.2 3.7 4.1    GFR: Estimated Creatinine Clearance: 100.1 mL/min (by C-G formula based on SCr of 0.79 mg/dL). Liver Function Tests: Recent Labs  Lab 10/31/21 1054 11/01/21 0722 11/02/21 0823 11/03/21 0428 11/04/21 0717  AST 34 41 39 37 33  ALT 60* 74* 76* 73* 69*  ALKPHOS 118 125 123 113 117  BILITOT 0.4 0.4 0.4 0.4 0.3  PROT 7.3 7.4 7.2 7.1 6.8  ALBUMIN 2.9* 3.0* 3.0* 3.0* 2.9*    No results for input(s): "LIPASE", "AMYLASE" in the last 168 hours. Recent Labs  Lab 11/02/21 0823 11/03/21 0428 11/04/21 0717 11/05/21 0559 11/06/21 0451  AMMONIA 28  42* 24 <10 36*    Coagulation Profile: No results for input(s): "INR", "PROTIME" in the last 168 hours. Cardiac Enzymes: Recent Labs  Lab 10/31/21 1054  CKTOTAL 41*  CKMB 1.3    BNP (last 3 results) No results for input(s): "PROBNP" in the last 8760 hours. HbA1C: No results for input(s): "HGBA1C" in the last 72 hours. CBG: Recent Labs  Lab 11/06/21 1625 11/06/21 2032 11/07/21 0037 11/07/21 0332 11/07/21 0852  GLUCAP 104* 97 159* 117* 98    Lipid Profile: No results for input(s): "CHOL", "HDL", "LDLCALC", "TRIG", "CHOLHDL", "LDLDIRECT" in the last 72 hours. Thyroid Function Tests: No results for input(s): "TSH", "T4TOTAL", "FREET4", "T3FREE", "THYROIDAB" in the last 72 hours. Anemia Panel: No results for input(s): "VITAMINB12", "FOLATE", "FERRITIN", "TIBC", "IRON", "RETICCTPCT" in the last 72 hours. Sepsis Labs: No results for input(s): "PROCALCITON", "LATICACIDVEN" in the last 168 hours.  Recent Results (from the past 240 hour(s))  Gastrointestinal Panel by PCR , Stool     Status: None   Collection Time: 10/29/21  5:51 PM   Specimen: Stool  Result Value Ref Range Status   Campylobacter species NOT DETECTED NOT DETECTED Final   Plesimonas shigelloides NOT DETECTED NOT DETECTED Final   Salmonella species NOT DETECTED NOT DETECTED Final   Yersinia enterocolitica NOT DETECTED NOT DETECTED Final   Vibrio species NOT DETECTED NOT DETECTED Final   Vibrio cholerae NOT DETECTED NOT DETECTED Final   Enteroaggregative E coli (EAEC) NOT DETECTED NOT DETECTED Final   Enteropathogenic E coli (EPEC) NOT DETECTED NOT DETECTED Final   Enterotoxigenic E coli (ETEC) NOT DETECTED NOT DETECTED Final   Shiga like toxin producing E coli (STEC) NOT DETECTED NOT DETECTED Final   Shigella/Enteroinvasive E coli (EIEC) NOT DETECTED NOT DETECTED Final   Cryptosporidium NOT DETECTED NOT DETECTED Final   Cyclospora cayetanensis NOT DETECTED NOT DETECTED Final   Entamoeba histolytica NOT  DETECTED NOT DETECTED Final   Giardia lamblia NOT DETECTED NOT DETECTED Final   Adenovirus F40/41 NOT DETECTED NOT DETECTED Final   Astrovirus NOT DETECTED NOT DETECTED Final   Norovirus GI/GII NOT DETECTED NOT DETECTED Final   Rotavirus A NOT DETECTED NOT DETECTED Final   Sapovirus (I, II, IV, and V) NOT DETECTED NOT DETECTED Final  Comment: Performed at [facility], [address] [place], [place] [address]     Radiology Studies: No results found.  Scheduled Meds:  amLODipine  5 mg Oral Daily   enoxaparin (LOVENOX) injection  70 mg Subcutaneous Q12H   feeding supplement  237 mL Oral TID BM   feeding supplement (JEVITY 1.5 CAL/FIBER)  1,000 mL Per Tube Q24H   feeding supplement (PROSource TF20)  60 mL Per Tube Daily   fiber supplement (BANATROL TF)  60 mL Per Tube BID   free water  150 mL Per Tube Q4H   LORazepam  1 mg Intravenous Once   melatonin  5 mg Per Tube QHS   mouth rinse  15 mL Mouth Rinse 4 times per day   pantoprazole  40 mg Per Tube Daily   thiamine  100 mg Per Tube Daily   Continuous Infusions:     LOS: 17 days   Darliss Cheney, MD [facility]  11/07/2021, 10:18 AM   *Please note that this is a verbal dictation therefore any spelling or grammatical errors are due to the "[employer]" system interpretation.  Please page via [place] and do not message via secure chat for urgent patient care matters. Secure chat can be used for non urgent patient care matters.  How to contact the [facility] Attending or Consulting provider [place] or covering provider during after hours [place], for this patient?  Check the care team in [facility] and look for a) attending/consulting [facility] provider listed and b) the [facility] team listed. Page or secure chat [phone]. Log into [email or web address] and use [place]'s universal password to access. If you do not have the password, please contact the hospital operator. Locate the [facility] provider you are looking for under [facility]  and page to a number that you can be directly reached. If you still have difficulty reaching the provider, please page the [name] (Director on Call) for the Hospitalists listed on amion for assistance.

## 2021-11-07 NOTE — Plan of Care (Signed)
  Problem: Education: Goal: Knowledge of General Education information will improve Description: Including pain rating scale, medication(s)/side effects and non-pharmacologic comfort measures 11/07/2021 2351 by Zadie Rhine, RN Outcome: Progressing 11/07/2021 2316 by Zadie Rhine, RN Outcome: Progressing   Problem: Health Behavior/Discharge Planning: Goal: Ability to manage health-related needs will improve Outcome: Progressing   Problem: Clinical Measurements: Goal: Ability to maintain clinical measurements within normal limits will improve Outcome: Progressing Goal: Will remain free from infection Outcome: Progressing   Problem: Education: Goal: Knowledge of General Education information will improve Description: Including pain rating scale, medication(s)/side effects and non-pharmacologic comfort measures 11/07/2021 2351 by Zadie Rhine, RN Outcome: Progressing 11/07/2021 2316 by Zadie Rhine, RN Outcome: Progressing   Problem: Health Behavior/Discharge Planning: Goal: Ability to manage health-related needs will improve Outcome: Progressing   Problem: Clinical Measurements: Goal: Ability to maintain clinical measurements within normal limits will improve Outcome: Progressing Goal: Will remain free from infection Outcome: Progressing   Problem: Education: Goal: Knowledge of General Education information will improve Description: Including pain rating scale, medication(s)/side effects and non-pharmacologic comfort measures 11/07/2021 2351 by Zadie Rhine, RN Outcome: Progressing 11/07/2021 2316 by Zadie Rhine, RN Outcome: Progressing   Problem: Health Behavior/Discharge Planning: Goal: Ability to manage health-related needs will improve Outcome: Progressing   Problem: Clinical Measurements: Goal: Ability to maintain clinical measurements within normal limits will improve Outcome: Progressing Goal: Will remain free from infection Outcome:  Progressing

## 2021-11-07 NOTE — Progress Notes (Signed)
Has been calmer today.  Restraints were removed at 0750 and he has been calm with just a sitter.  He still get slightly agitated an attempts to get out of the bed but the sitter is able to talk him down and calm him down, whereas yesterday, without the sitter, he kept trying to move all over the bed and to get out of the bed. He still remains very impulsive and moves about.  He is just calmer with tech/sitter with him at all times.

## 2021-11-08 DIAGNOSIS — Z419 Encounter for procedure for purposes other than remedying health state, unspecified: Secondary | ICD-10-CM | POA: Diagnosis not present

## 2021-11-08 DIAGNOSIS — G9340 Encephalopathy, unspecified: Secondary | ICD-10-CM | POA: Diagnosis not present

## 2021-11-08 LAB — GLUCOSE, CAPILLARY
Glucose-Capillary: 144 mg/dL — ABNORMAL HIGH (ref 70–99)
Glucose-Capillary: 145 mg/dL — ABNORMAL HIGH (ref 70–99)
Glucose-Capillary: 102 mg/dL — ABNORMAL HIGH (ref 70–99)
Glucose-Capillary: 96 mg/dL (ref 70–99)
Glucose-Capillary: 96 mg/dL (ref 70–99)

## 2021-11-08 LAB — AMMONIA: Ammonia: 45 umol/L — ABNORMAL HIGH (ref 9–35)

## 2021-11-08 NOTE — Progress Notes (Signed)
Safety sitter unavailable; reduce staffing, pt restless and does not follow commands, flails around in bed, high risk of falling. Md updated and waist belt restraint ordered for safety. SRP, RN

## 2021-11-08 NOTE — Plan of Care (Signed)
  Problem: Education: Goal: Knowledge of General Education information will improve Description: Including pain rating scale, medication(s)/side effects and non-pharmacologic comfort measures Outcome: Progressing   Problem: Health Behavior/Discharge Planning: Goal: Ability to manage health-related needs will improve Outcome: Progressing   Problem: Clinical Measurements: Goal: Ability to maintain clinical measurements within normal limits will improve Outcome: Progressing Goal: Will remain free from infection Outcome: Progressing Goal: Diagnostic test results will improve Outcome: Progressing Goal: Respiratory complications will improve Outcome: Progressing Goal: Cardiovascular complication will be avoided Outcome: Progressing   Problem: Nutrition: Goal: Adequate nutrition will be maintained Outcome: Progressing   Problem: Coping: Goal: Level of anxiety will decrease Outcome: Progressing   Problem: Elimination: Goal: Will not experience complications related to bowel motility Outcome: Progressing Goal: Will not experience complications related to urinary retention Outcome: Progressing   Problem: Safety: Goal: Ability to remain free from injury will improve Outcome: Progressing   Problem: Pain Managment: Goal: General experience of comfort will improve Outcome: Progressing   Problem: Safety: Goal: Non-violent Restraint(s) Outcome: Progressing

## 2021-11-08 NOTE — Progress Notes (Signed)
Jevity 1.5 tube feed is not available in the hospital tonight. Dr. Alcario Drought notified of this situation. Awaiting new orders

## 2021-11-08 NOTE — Plan of Care (Signed)
  Problem: Education: Goal: Knowledge of General Education information will improve Description: Including pain rating scale, medication(s)/side effects and non-pharmacologic comfort measures 11/07/2021 2351 by Zadie Rhine, RN Outcome: Progressing 11/07/2021 2316 by Zadie Rhine, RN Outcome: Progressing   Problem: Health Behavior/Discharge Planning: Goal: Ability to manage health-related needs will improve Outcome: Progressing   Problem: Clinical Measurements: Goal: Ability to maintain clinical measurements within normal limits will improve Outcome: Progressing Goal: Will remain free from infection Outcome: Progressing

## 2021-11-08 NOTE — Progress Notes (Signed)
PROGRESS NOTE    [name]  FAO:[id number] DOB: 08/05/1965 DOA: 10/21/2021 PCP: [name], MD   Brief Narrative:  56 year old WM PMHx Polysubstance abuse, Hepatitis C,    Presented to [facility] in [place] on 9/6 after being found down.  He was last known well at approximately 13:00 hours that day however EMS was called for wellness check at some time later on.  He was found to be down and unresponsive.  On EMS evaluation the patient was apneic with thready pulse.  He did not responded to intranasal Narcan.  He was bagged in route to the emergency department and subsequently intubated on arrival to the ED.  CT head demonstrated cerebral edema without loss of white matter differentiation.  He was transferred to [facility] for MRI.  On 9/11 he was noted to have upper extremity edema on the right.  Ultrasound demonstrated acute thrombosis in the mid and distal basilic vein.  Hospital course also complicated by aspiration pneumonia.  Subsequently transferred to [facility] on 9/13.  Events:  9/6 found down > present to [facility] ED. CT with bilateral cerebellar edema. 9/7 repeat CT normal Unable to be weaned from sedation 9/11 RUE DVT started on tx dose lovenox 9/13 transfer to [facility] for MRI 9/14 chest tube placed with intrapleural fibrinolytic administration 9/16 chest tube removed 9/17 patient extubated, remained agitated and started on precedex 9/19 Cortrak placed     Assessment & Plan:   Principal Problem:   Acute encephalopathy Active Problems:   Fever   Pleural effusion   Deep venous thrombosis (HCC)   Acute respiratory failure with hypoxia (HCC)   Cerebrovascular accident (CVA) (HCC)   Protein-calorie malnutrition, severe   Anoxic brain injury (HCC)  Acute toxic metabolic encephalopathy in the setting of ischemic stroke: -On high-dose thiamine -In the setting of acute stroke. -LP performed, meningitis panel was negative. -UDS was positive for  cocaine. -EEG-persistently showed no epileptiform activity -Per neurology high suspicion for anoxic brain injury versus toxin induced encephalopathy and ischemic strokes. Neurology recommend continue with thiamine, no immediate indication for addition of antiplatelets for secondary stroke prevention as patient is on therapeutic Lovenox.  When patient is off of anticoagulation, aspirin should be started.   2-Multifocal cerebral infarcts: -Neurology recommended to start aspirin when patient is off of anticoagulation -Currently on Lovenox for treatment of vein thrombosis.   3-Acute hypoxic respiratory failure secondary to aspiration pneumonia Right-sided aspiration pneumonia with parapneumonic effusion: -Status post chest tube placement with intrapleural lytics on 9/14, chest tube removed 9/16. -Completed 7 days of IV Zosyn -Intubated on admission outside hospital 9/6. -Extubated 9/17. -Currently on Room air.    Superficial vein thrombosis Diagnosed 9/11 with right basilic vein thrombus -Due to increase for progression of thrombosis patient was started on Lovenox after previous hospitalist discussed with oncology Dr. Clelia Croft.  Dysphagia/nutrition: Patient is being followed by SLP, they have now advanced him to dysphagia 3 diet. Tube was removed on 11/05/21.  Encourage p.o. intake.   Thrombocytosis: We need to add aspirin once off of anticoagulation. Monitor.    Mild transaminases, history of hepatitis C, improved.   Diarrhea: Likely due to tube feedings.  Resolved.  DVT prophylaxis:   Lovenox   Code Status: Full Code  Family Communication: None at bedside today.  Status is: Inpatient Remains inpatient appropriate because: Needs placement.   Estimated body mass index is 22.4 kg/m as calculated from the following:   Height as of this encounter: 5\' 10"  (1.778  m).   Weight as of this encounter: 70.8 kg.    Nutritional Assessment: Body mass index is 22.4 kg/m.Marland Kitchen Seen by dietician.   I agree with the assessment and plan as outlined below: Nutrition Status: Nutrition Problem: Severe Malnutrition Etiology: social / environmental circumstances (drug abuse) Signs/Symptoms: severe fat depletion, severe muscle depletion Interventions: Tube feeding  . Skin Assessment: I have examined the patient's skin and I agree with the wound assessment as performed by the wound care RN as outlined below:    Consultants:  Neurology Palliative care  Procedures:  As above  Antimicrobials:  Anti-infectives (From admission, onward)    Start     Dose/Rate Route Frequency Ordered Stop   10/23/21 1000  cefTRIAXone (ROCEPHIN) 2 g in sodium chloride 0.9 % 100 mL IVPB  Status:  Discontinued        2 g 200 mL/hr over 30 Minutes Intravenous Every 24 hours 10/22/21 0036 10/22/21 0037   10/23/21 1000  cefTRIAXone (ROCEPHIN) 2 g in sodium chloride 0.9 % 100 mL IVPB  Status:  Discontinued        2 g 200 mL/hr over 30 Minutes Intravenous Every 24 hours 10/22/21 0037 10/22/21 0827   10/22/21 0930  piperacillin-tazobactam (ZOSYN) IVPB 3.375 g        3.375 g 12.5 mL/hr over 240 Minutes Intravenous Every 8 hours 10/22/21 0833 10/29/21 0559   10/22/21 0915  ceFEPIme (MAXIPIME) 2 g in sodium chloride 0.9 % 100 mL IVPB  Status:  Discontinued        2 g 200 mL/hr over 30 Minutes Intravenous Every 8 hours 10/22/21 0827 10/22/21 0833         Subjective:  Seen and examined.  Alert but not oriented, as usual.  Status quo.  Appears comfortable.   Objective: Vitals:   11/07/21 2338 11/08/21 0418 11/08/21 0500 11/08/21 0751  BP: (!) 145/86 (!) 153/92  111/83  Pulse: 88 98  98  Resp: 17 18  16   Temp: 97.8 F (36.6 C) (!) 97.2 F (36.2 C)  (!) 97.5 F (36.4 C)  TempSrc:  Oral  Oral  SpO2: 99% 100%  98%  Weight:   70.8 kg   Height:        Intake/Output Summary (Last 24 hours) at 11/08/2021 0933 Last data filed at 11/08/2021 0648 Gross per 24 hour  Intake 600 ml  Output 700 ml  Net -100  ml    Filed Weights   11/06/21 0500 11/07/21 0500 11/08/21 0500  Weight: 72.5 kg 69.5 kg 70.8 kg    Examination:  General exam: Appears calm and comfortable  Respiratory system: Clear to auscultation. Respiratory effort normal. Cardiovascular system: S1 & S2 heard, RRR. No JVD, murmurs, rubs, gallops or clicks. No pedal edema. Gastrointestinal system: Abdomen is nondistended, soft and nontender. No organomegaly or masses felt. Normal bowel sounds heard. Central nervous system: Alert but not oriented.  Data Reviewed: I have personally reviewed following labs and imaging studies  CBC: Recent Labs  Lab 11/02/21 0823 11/03/21 0428 11/04/21 0717 11/05/21 0559 11/06/21 0451  WBC 11.8* 11.9* 10.2 9.4 10.1  NEUTROABS 8.6* 8.0* 7.0 5.5 6.4  HGB 14.2 13.8 13.4 13.6 13.5  HCT 41.7 41.7 39.4 40.8 40.0  MCV 94.6 95.9 94.0 94.2 94.6  PLT 612* 574* 541* 528* 414*    Basic Metabolic Panel: Recent Labs  Lab 11/02/21 0823 11/03/21 0428 11/04/21 0717 11/05/21 0559 11/06/21 0451  NA 138 135 138  --   --  K 4.1 4.4 3.8  --   --   CL 102 103 101  --   --   CO2 24 24 27   --   --   GLUCOSE 132* 120* 122*  --   --   BUN 21* 22* 20  --   --   CREATININE 0.72 0.65 0.79  --   --   CALCIUM 9.7 9.2 9.4  --   --   MG 2.4 2.2 2.3 2.3 2.3  PHOS 4.2 4.0 4.2 3.7 4.1    GFR: Estimated Creatinine Clearance: 102 mL/min (by C-G formula based on SCr of 0.79 mg/dL). Liver Function Tests: Recent Labs  Lab 11/02/21 0823 11/03/21 0428 11/04/21 0717  AST 39 37 33  ALT 76* 73* 69*  ALKPHOS 123 113 117  BILITOT 0.4 0.4 0.3  PROT 7.2 7.1 6.8  ALBUMIN 3.0* 3.0* 2.9*    No results for input(s): "LIPASE", "AMYLASE" in the last 168 hours. Recent Labs  Lab 11/04/21 0717 11/05/21 0559 11/06/21 0451 11/07/21 1000 11/08/21 0730  AMMONIA 24 <10 36* 46* 45*    Coagulation Profile: No results for input(s): "INR", "PROTIME" in the last 168 hours. Cardiac Enzymes: No results for input(s):  "CKTOTAL", "CKMB", "CKMBINDEX", "TROPONINI" in the last 168 hours.  BNP (last 3 results) No results for input(s): "PROBNP" in the last 8760 hours. HbA1C: No results for input(s): "HGBA1C" in the last 72 hours. CBG: Recent Labs  Lab 11/07/21 1630 11/07/21 1917 11/07/21 2339 11/08/21 0420 11/08/21 0750  GLUCAP 123* 118* 141* 144* 145*    Lipid Profile: No results for input(s): "CHOL", "HDL", "LDLCALC", "TRIG", "CHOLHDL", "LDLDIRECT" in the last 72 hours. Thyroid Function Tests: No results for input(s): "TSH", "T4TOTAL", "FREET4", "T3FREE", "THYROIDAB" in the last 72 hours. Anemia Panel: No results for input(s): "VITAMINB12", "FOLATE", "FERRITIN", "TIBC", "IRON", "RETICCTPCT" in the last 72 hours. Sepsis Labs: No results for input(s): "PROCALCITON", "LATICACIDVEN" in the last 168 hours.  Recent Results (from the past 240 hour(s))  Gastrointestinal Panel by PCR , Stool     Status: None   Collection Time: 10/29/21  5:51 PM   Specimen: Stool  Result Value Ref Range Status   Campylobacter species NOT DETECTED NOT DETECTED Final   Plesimonas shigelloides NOT DETECTED NOT DETECTED Final   Salmonella species NOT DETECTED NOT DETECTED Final   Yersinia enterocolitica NOT DETECTED NOT DETECTED Final   Vibrio species NOT DETECTED NOT DETECTED Final   Vibrio cholerae NOT DETECTED NOT DETECTED Final   Enteroaggregative E coli (EAEC) NOT DETECTED NOT DETECTED Final   Enteropathogenic E coli (EPEC) NOT DETECTED NOT DETECTED Final   Enterotoxigenic E coli (ETEC) NOT DETECTED NOT DETECTED Final   Shiga like toxin producing E coli (STEC) NOT DETECTED NOT DETECTED Final   Shigella/Enteroinvasive E coli (EIEC) NOT DETECTED NOT DETECTED Final   Cryptosporidium NOT DETECTED NOT DETECTED Final   Cyclospora cayetanensis NOT DETECTED NOT DETECTED Final   Entamoeba histolytica NOT DETECTED NOT DETECTED Final   Giardia lamblia NOT DETECTED NOT DETECTED Final   Adenovirus F40/41 NOT DETECTED NOT  DETECTED Final   Astrovirus NOT DETECTED NOT DETECTED Final   Norovirus GI/GII NOT DETECTED NOT DETECTED Final   Rotavirus A NOT DETECTED NOT DETECTED Final   Sapovirus (I, II, IV, and V) NOT DETECTED NOT DETECTED Final    Comment: Performed at [facility], [address], [place], [place]     Radiology Studies: No results found.  Scheduled Meds:  amLODipine  5  mg Oral Daily   enoxaparin (LOVENOX) injection  70 mg Subcutaneous Q12H   feeding supplement  237 mL Oral TID BM   feeding supplement (JEVITY 1.5 CAL/FIBER)  1,000 mL Per Tube Q24H   feeding supplement (PROSource TF20)  60 mL Per Tube Daily   fiber supplement (BANATROL TF)  60 mL Per Tube BID   free water  150 mL Per Tube Q4H   LORazepam  1 mg Intravenous Once   melatonin  5 mg Per Tube QHS   mouth rinse  15 mL Mouth Rinse 4 times per day   pantoprazole  40 mg Per Tube Daily   thiamine  100 mg Per Tube Daily   Continuous Infusions:     LOS: 18 days   Darliss Cheney, MD [facility]  11/08/2021, 9:33 AM   *Please note that this is a verbal dictation therefore any spelling or grammatical errors are due to the "[place] One" system interpretation.  Please page via [place] and do not message via secure chat for urgent patient care matters. Secure chat can be used for non urgent patient care matters.  How to contact the [facility] Attending or Consulting provider [place] or covering provider during after hours [place], for this patient?  Check the care team in [facility] and look for a) attending/consulting TRH provider listed and b) the [facility] team listed. Page or secure chat [phone]. Log into [email or web address] and use [place]'s universal password to access. If you do not have the password, please contact the hospital operator. Locate the [facility] provider you are looking for under [facility] and page to a number that you can be directly reached. If you still have difficulty reaching the provider, please page  the [facility] (Director on Call) for the Hospitalists listed on amion for assistance.

## 2021-11-09 DIAGNOSIS — G9340 Encephalopathy, unspecified: Secondary | ICD-10-CM | POA: Diagnosis not present

## 2021-11-09 DIAGNOSIS — G931 Anoxic brain damage, not elsewhere classified: Secondary | ICD-10-CM | POA: Diagnosis not present

## 2021-11-09 DIAGNOSIS — J9601 Acute respiratory failure with hypoxia: Secondary | ICD-10-CM | POA: Diagnosis not present

## 2021-11-09 DIAGNOSIS — E43 Unspecified severe protein-calorie malnutrition: Secondary | ICD-10-CM | POA: Diagnosis not present

## 2021-11-09 LAB — GLUCOSE, CAPILLARY
Glucose-Capillary: 120 mg/dL — ABNORMAL HIGH (ref 70–99)
Glucose-Capillary: 145 mg/dL — ABNORMAL HIGH (ref 70–99)
Glucose-Capillary: 156 mg/dL — ABNORMAL HIGH (ref 70–99)
Glucose-Capillary: 91 mg/dL (ref 70–99)
Glucose-Capillary: 92 mg/dL (ref 70–99)
Glucose-Capillary: 98 mg/dL (ref 70–99)

## 2021-11-09 LAB — AMMONIA: Ammonia: 17 umol/L (ref 9–35)

## 2021-11-09 MED ORDER — OSMOLITE 1.5 CAL PO LIQD
1000.0000 mL | ORAL | Status: DC
  Administered 2021-11-09 – 2021-11-10 (×2): 1000 mL
  Filled 2021-11-09 (×2): qty 1000

## 2021-11-09 MED ORDER — ONDANSETRON HCL 4 MG/2ML IJ SOLN
4.0000 mg | Freq: Four times a day (QID) | INTRAMUSCULAR | Status: DC | PRN
  Administered 2021-11-09 – 2021-11-13 (×3): 4 mg via INTRAVENOUS
  Filled 2021-11-09 (×4): qty 2

## 2021-11-09 NOTE — Progress Notes (Signed)
Pt c/o pain unrelieved by previously given dose of PRN oxycodone. He also c/o nausea. VSS. Dr. Alcario Drought notified. Awaiting orders.

## 2021-11-09 NOTE — Progress Notes (Signed)
PROGRESS NOTE    [name]  JME:[id number] DOB: 1965-03-30 DOA: 10/21/2021 PCP: [name], MD   Brief Narrative:  56 year old WM PMHx Polysubstance abuse, Hepatitis C,    Presented to [facility] in [place] on 9/6 after being found down.  He was last known well at approximately 13:00 hours that day however EMS was called for wellness check at some time later on.  He was found to be down and unresponsive.  On EMS evaluation the patient was apneic with thready pulse.  He did not responded to intranasal Narcan.  He was bagged in route to the emergency department and subsequently intubated on arrival to the ED.  CT head demonstrated cerebral edema without loss of white matter differentiation.  He was transferred to [facility] for MRI.  On 9/11 he was noted to have upper extremity edema on the right.  Ultrasound demonstrated acute thrombosis in the mid and distal basilic vein.  Hospital course also complicated by aspiration pneumonia.  Subsequently transferred to [facility] on 9/13.  Events:  9/6 found down > present to [facility] ED. CT with bilateral cerebellar edema. 9/7 repeat CT normal Unable to be weaned from sedation 9/11 RUE DVT started on tx dose lovenox 9/13 transfer to [facility] for MRI 9/14 chest tube placed with intrapleural fibrinolytic administration 9/16 chest tube removed 9/17 patient extubated, remained agitated and started on precedex 9/19 Cortrak placed     Assessment & Plan:   Principal Problem:   Acute encephalopathy Active Problems:   Fever   Pleural effusion   Deep venous thrombosis (HCC)   Acute respiratory failure with hypoxia (HCC)   Cerebrovascular accident (CVA) ([place])   Protein-calorie malnutrition, severe   Anoxic brain injury ([place])  Acute toxic metabolic encephalopathy in the setting of ischemic stroke: -On high-dose thiamine -In the setting of acute stroke. -LP performed, meningitis panel was negative. -UDS was positive for  cocaine. -EEG-persistently showed no epileptiform activity -Per neurology high suspicion for anoxic brain injury versus toxin induced encephalopathy and ischemic strokes. Neurology recommend continue with thiamine, no immediate indication for addition of antiplatelets for secondary stroke prevention as patient is on therapeutic Lovenox.  When patient is off of anticoagulation, aspirin should be started.   2-Multifocal cerebral infarcts: -Neurology recommended to start aspirin when patient is off of anticoagulation -Currently on Lovenox for treatment of vein thrombosis.   3-Acute hypoxic respiratory failure secondary to aspiration pneumonia Right-sided aspiration pneumonia with parapneumonic effusion: -Status post chest tube placement with intrapleural lytics on 9/14, chest tube removed 9/16. -Completed 7 days of IV Zosyn -Intubated on admission outside hospital 9/6. -Extubated 9/17. -Currently on Room air.    Superficial vein thrombosis Diagnosed 2/29 with right basilic vein thrombus -Due to increase for progression of thrombosis patient was started on Lovenox after previous hospitalist discussed with oncology Dr. Alen Blew.  Dysphagia/nutrition: Patient is being followed by SLP, they have now advanced him to dysphagia 3 diet.  He is on nocturnal tube feeds.  Per nursing report, his p.o. intake is not adequate.  SLP following.   Thrombocytosis: We need to add aspirin once off of anticoagulation. Monitor.    Mild transaminases, history of hepatitis C, improved.   Diarrhea: Likely due to tube feedings.  Resolved.  DVT prophylaxis:   Lovenox   Code Status: Full Code  Family Communication: None at bedside today.  Status is: Inpatient Remains inpatient appropriate because: Needs placement.   Estimated body mass index is 22.4 kg/m as calculated from the  following:   Height as of this encounter: 5\' 10"  (1.778 m).   Weight as of this encounter: 70.8 kg.    Nutritional  Assessment: Body mass index is 22.4 kg/m. Seen by dietician.  I agree with the assessment and plan as outlined below: Nutrition Status: Nutrition Problem: Severe Malnutrition Etiology: social / environmental circumstances (drug abuse) Signs/Symptoms: severe fat depletion, severe muscle depletion Interventions: Tube feeding  . Skin Assessment: I have examined the patient's skin and I agree with the wound assessment as performed by the wound care RN as outlined below:    Consultants:  Neurology Palliative care  Procedures:  As above  Antimicrobials:  Anti-infectives (From admission, onward)    Start     Dose/Rate Route Frequency Ordered Stop   10/23/21 1000  cefTRIAXone (ROCEPHIN) 2 g in sodium chloride 0.9 % 100 mL IVPB  Status:  Discontinued        2 g 200 mL/hr over 30 Minutes Intravenous Every 24 hours 10/22/21 0036 10/22/21 0037   10/23/21 1000  cefTRIAXone (ROCEPHIN) 2 g in sodium chloride 0.9 % 100 mL IVPB  Status:  Discontinued        2 g 200 mL/hr over 30 Minutes Intravenous Every 24 hours 10/22/21 0037 10/22/21 0827   10/22/21 0930  piperacillin-tazobactam (ZOSYN) IVPB 3.375 g        3.375 g 12.5 mL/hr over 240 Minutes Intravenous Every 8 hours 10/22/21 0833 10/29/21 0559   10/22/21 0915  ceFEPIme (MAXIPIME) 2 g in sodium chloride 0.9 % 100 mL IVPB  Status:  Discontinued        2 g 200 mL/hr over 30 Minutes Intravenous Every 8 hours 10/22/21 0827 10/22/21 0833         Subjective:  Seen and examined.  Alert but not oriented at his baseline.  Appears comfortable.  Has vest restraints.  Objective: Vitals:   11/08/21 1128 11/08/21 1643 11/08/21 2035 11/09/21 0827  BP: (!) 117/93 (!) 115/95 122/84 (!) 132/92  Pulse: (!) 103 99 91 93  Resp: 18 18 16 20   Temp: 97.7 F (36.5 C) 97.8 F (36.6 C) 98 F (36.7 C) 97.8 F (36.6 C)  TempSrc: Oral Oral Oral Oral  SpO2: 99% 99% 100% 100%  Weight:      Height:        Intake/Output Summary (Last 24 hours) at  11/09/2021 0856 Last data filed at 11/09/2021 0136 Gross per 24 hour  Intake --  Output 900 ml  Net -900 ml    Filed Weights   11/06/21 0500 11/07/21 0500 11/08/21 0500  Weight: 72.5 kg 69.5 kg 70.8 kg    Examination:  General exam: Appears calm and comfortable  Respiratory system: Clear to auscultation. Respiratory effort normal. Cardiovascular system: S1 & S2 heard, RRR. No JVD, murmurs, rubs, gallops or clicks. No pedal edema. Gastrointestinal system: Abdomen is nondistended, soft and nontender. No organomegaly or masses felt. Normal bowel sounds heard. Central nervous system: Alert but not oriented.  Data Reviewed: I have personally reviewed following labs and imaging studies  CBC: Recent Labs  Lab 11/03/21 0428 11/04/21 0717 11/05/21 0559 11/06/21 0451  WBC 11.9* 10.2 9.4 10.1  NEUTROABS 8.0* 7.0 5.5 6.4  HGB 13.8 13.4 13.6 13.5  HCT 41.7 39.4 40.8 40.0  MCV 95.9 94.0 94.2 94.6  PLT 574* 541* 528* 414*    Basic Metabolic Panel: Recent Labs  Lab 11/03/21 0428 11/04/21 0717 11/05/21 0559 11/06/21 0451  NA 135 138  --   --  K 4.4 3.8  --   --   CL 103 101  --   --   CO2 24 27  --   --   GLUCOSE 120* 122*  --   --   BUN 22* 20  --   --   CREATININE 0.65 0.79  --   --   CALCIUM 9.2 9.4  --   --   MG 2.2 2.3 2.3 2.3  PHOS 4.0 4.2 3.7 4.1    GFR: Estimated Creatinine Clearance: 102 mL/min (by C-G formula based on SCr of 0.79 mg/dL). Liver Function Tests: Recent Labs  Lab 11/03/21 0428 11/04/21 0717  AST 37 33  ALT 73* 69*  ALKPHOS 113 117  BILITOT 0.4 0.3  PROT 7.1 6.8  ALBUMIN 3.0* 2.9*    No results for input(s): "LIPASE", "AMYLASE" in the last 168 hours. Recent Labs  Lab 11/05/21 0559 11/06/21 0451 11/07/21 1000 11/08/21 0730 11/09/21 0256  AMMONIA <10 36* 46* 45* 17    Coagulation Profile: No results for input(s): "INR", "PROTIME" in the last 168 hours. Cardiac Enzymes: No results for input(s): "CKTOTAL", "CKMB", "CKMBINDEX",  "TROPONINI" in the last 168 hours.  BNP (last 3 results) No results for input(s): "PROBNP" in the last 8760 hours. HbA1C: No results for input(s): "HGBA1C" in the last 72 hours. CBG: Recent Labs  Lab 11/08/21 1638 11/08/21 2045 11/09/21 0011 11/09/21 0419 11/09/21 0824  GLUCAP 96 96 156* 120* 145*    Lipid Profile: No results for input(s): "CHOL", "HDL", "LDLCALC", "TRIG", "CHOLHDL", "LDLDIRECT" in the last 72 hours. Thyroid Function Tests: No results for input(s): "TSH", "T4TOTAL", "FREET4", "T3FREE", "THYROIDAB" in the last 72 hours. Anemia Panel: No results for input(s): "VITAMINB12", "FOLATE", "FERRITIN", "TIBC", "IRON", "RETICCTPCT" in the last 72 hours. Sepsis Labs: No results for input(s): "PROCALCITON", "LATICACIDVEN" in the last 168 hours.  No results found for this or any previous visit (from the past 240 hour(s)).    Radiology Studies: No results found.  Scheduled Meds:  amLODipine  5 mg Oral Daily   enoxaparin (LOVENOX) injection  70 mg Subcutaneous Q12H   feeding supplement  237 mL Oral TID BM   feeding supplement (OSMOLITE 1.5 CAL)  1,000 mL Per Tube Q24H   feeding supplement (PROSource TF20)  60 mL Per Tube Daily   fiber supplement (BANATROL TF)  60 mL Per Tube BID   free water  150 mL Per Tube Q4H   LORazepam  1 mg Intravenous Once   melatonin  5 mg Per Tube QHS   mouth rinse  15 mL Mouth Rinse 4 times per day   pantoprazole  40 mg Per Tube Daily   thiamine  100 mg Per Tube Daily   Continuous Infusions:     LOS: 19 days   Darliss Cheney, MD [facility]  11/09/2021, 8:56 AM   *Please note that this is a verbal dictation therefore any spelling or grammatical errors are due to the "[place] One" system interpretation.  Please page via [place] and do not message via secure chat for urgent patient care matters. Secure chat can be used for non urgent patient care matters.  How to contact the [facility] Attending or Consulting provider [place] or  covering provider during after hours [place], for this patient?  Check the care team in [facility] and look for a) attending/consulting TRH provider listed and b) the [facility] team listed. Page or secure chat [phone]. Log into [email or web address] and use [place]'s universal password to  access. If you do not have the password, please contact the hospital operator. Locate the [facility] provider you are looking for under [facility] and page to a number that you can be directly reached. If you still have difficulty reaching the provider, please page the [facility] (Director on Call) for the Hospitalists listed on amion for assistance.

## 2021-11-09 NOTE — Progress Notes (Addendum)
Called daughter with updates.   Daughter has concerns regarding his possible discharge and would like a consult to inpatient rehab and not SNF.  She would like a call with Palliative and Social work.

## 2021-11-09 NOTE — Progress Notes (Signed)
   Palliative Medicine Inpatient Follow Up Note HPI: 56 year old WM PMHx Polysubstance abuse, Hepatitis C, and GERD.   Presented to [facility] in [place] on 9/6 after being found down.  He was last known well at approximately 13:00 hours that day however EMS was called for wellness check at some time later on.  He was found to be down and unresponsive.  On EMS evaluation the patient was apneic with thready pulse.  He did not responded to intranasal Narcan.  He was bagged in route to the emergency department and subsequently intubated on arrival to the ED.  CT head demonstrated cerebral edema without loss of white matter differentiation.  He was transferred to [facility] for MRI.  On 9/11 he was noted to have upper extremity edema on the right.  Ultrasound demonstrated acute thrombosis in the mid and distal basilic vein.  Hospital course also complicated by aspiration pneumonia.  Subsequently transferred to [facility] on 9/13.     Palliative care was asked to get involved to further address goals of care in the setting of an acute stroke.  Today's Discussion 11/09/2021  *Please note that this is a verbal dictation therefore any spelling or grammatical errors are due to the "[employer] One" system interpretation.  Chart reviewed inclusive of vital signs, progress notes, laboratory results, and diagnostic images.  Patient assessed at the bedside today.  He is emotional, repeatedly apologizing but unable to tell me what he is apologizing for.  Reassurance provided.  He occasionally sits up on the edge of the bed and appears restless.  No family present during my visit.  Attempted to explore Jaise' thoughts and feelings.  He states "oh man" and responds affirmatively when asked if he is feeling stressed out.  Emotional support provided.  I then called patient's daughter [name] but was unable to reach.  Voicemail with PMT contact information was provided and she was encouraged to  call back with any needs.  The Palliative team will continue supporting Javoni during this time.   Objective Assessment: Vital Signs Vitals:   11/09/21 0827 11/09/21 1109  BP: (!) 132/92 (!) 127/93  Pulse: 93 98  Resp: 20 20  Temp: 97.8 F (36.6 C) 97.7 F (36.5 C)  SpO2: 100% 98%    Intake/Output Summary (Last 24 hours) at 11/09/2021 1432 Last data filed at 11/09/2021 0800 Gross per 24 hour  Intake 20 ml  Output 900 ml  Net -880 ml   Gen: Older Caucasian male, disheveled HEENT: Cortrack in place CV: Regular rate and regular rhythm  PULM: On room air breathing is even and nonlabored ABD: soft/nontender  EXT: No edema  Neuro: Awakens though does not follow commands Psych: Mildly agitated  SUMMARY OF RECOMMENDATIONS   -Continue full code/Full scope of care   -Psychosocial emotional support provided -PMT will continue to follow and support incrementally   Billing based on MDM: High ______________________________________________________________________________________ [place] Team Team Cell Phone: [phone] Please utilize secure chat with additional questions, if there is no response within 30 minutes please call the above phone number   Palliative Medicine Team providers are available by phone from 7am to 7pm daily and can be reached through the team cell phone.  Should this patient require assistance outside of these hours, please call the patient's attending physician.

## 2021-11-09 NOTE — Progress Notes (Signed)
Physical Therapy Treatment Patient Details Name: [name] MRN: [id number] DOB: 07/17/1965 Today's Date: 11/09/2021   History of Present Illness Pt is 56 yo male admitted on to [facility] in [name] on 9/6 after being found unresponsive.  Pt transferred to [facility] on 10/21/21.  Pt with acute toxic metabolic encephalopathy vs encephalitis in pt with acute ischemic strokes per neurology.  9/11 R UE DVT started on lovenox, 9/14-9/16 chest tube, ETT 9/6-9/18.  Hx of polysubstance abuse, hep C, and GERD.    PT Comments    Pt alert however continues to not be able to follow commands, answer simple questions, sequence tasks, and requires Ax2 for all mobility. Pt restless and tries to get out of bed however will not move to commands. Pt did spontaneously verbalize ie. "I don't need socks" when asked to put socks on or "You're about to trip my a**" when PT trying to help advance R LE to side step along EOB however when pt asked what his first name is pt unable to answer. Acute PT to cont to follow to progress mobility as able.    Recommendations for follow up therapy are one component of a multi-disciplinary discharge planning process, led by the attending physician.  Recommendations may be updated based on patient status, additional functional criteria and insurance authorization.  Follow Up Recommendations  Skilled nursing-short term rehab (<3 hours/day) Can patient physically be transported by private vehicle: No   Assistance Recommended at Discharge Frequent or constant Supervision/Assistance  Patient can return home with the following Two people to help with walking and/or transfers;Two people to help with bathing/dressing/bathroom;Assistance with cooking/housework;Assistance with feeding;Help with stairs or ramp for entrance   Equipment Recommendations  Other (comment) (TBA)    Recommendations for Other Services       Precautions / Restrictions Precautions Precautions: Fall Precaution  Comments: posey belt Restrictions Weight Bearing Restrictions: No     Mobility  Bed Mobility Overal bed mobility: Needs Assistance Bed Mobility: Sit to Supine, Rolling, Sidelying to Sit Rolling: Mod assist, +2 for physical assistance, +2 for safety/equipment Sidelying to sit: Mod assist, +2 for physical assistance   Sit to supine: Min assist (pt impulsively returns self to bed)   General bed mobility comments: Very poor initiation to come EOB    Transfers Overall transfer level: Needs assistance Equipment used: 2 person hand held assist (with gait belt) Transfers: Sit to/from Stand Sit to Stand: Mod assist, +2 physical assistance           General transfer comment: He required mod +2 for initiation of movement. pt with posterior bias in standing with and is quick to try to return to sitting. completed 3 sit to stands, pt unable to sequence steppign despite max tactile and verbal cues.    Ambulation/Gait               General Gait Details: unable to at this time   Stairs             Wheelchair Mobility    Modified Rankin (Stroke Patients Only) Modified Rankin (Stroke Patients Only) Pre-Morbid Rankin Score: No symptoms Modified Rankin: Severe disability     Balance Overall balance assessment: Needs assistance Sitting-balance support: Feet supported, Bilateral upper extremity supported Sitting balance-Leahy Scale: Fair Sitting balance - Comments: close minA to minG for sitting EOB balance. Pt with tendency to lean anteriorly requiring assist from PT to maintain up right position Postural control: Posterior lean Standing balance support: Bilateral upper extremity supported Standing  balance-Leahy Scale: Zero Standing balance comment: UE support for balance in standing                            Cognition Arousal/Alertness: Awake/alert Behavior During Therapy: Impulsive, Restless Overall Cognitive Status: Impaired/Different from  baseline Area of Impairment: Following commands, Problem solving, Attention, Safety/judgement, Awareness                   Current Attention Level: Focused   Following Commands: Follows one step commands inconsistently, Follows one step commands with increased time Safety/Judgement: Decreased awareness of safety, Decreased awareness of deficits Awareness: Intellectual (very little awareness of environment) Problem Solving: Slow processing, Decreased initiation, Difficulty sequencing General Comments: Pt with little to no initiation of all commands        Exercises      General Comments General comments (skin integrity, edema, etc.): VSS      Pertinent Vitals/Pain Pain Assessment Pain Assessment: Faces Faces Pain Scale: No hurt    Home Living                          Prior Function            PT Goals (current goals can now be found in the care plan section) Acute Rehab PT Goals PT Goal Formulation: With family Time For Goal Achievement: 11/24/21 Potential to Achieve Goals: Fair Progress towards PT goals: Not progressing toward goals - comment    Frequency    Min 2X/week      PT Plan Current plan remains appropriate    Co-evaluation              AM-PAC PT "6 Clicks" Mobility   Outcome Measure  Help needed turning from your back to your side while in a flat bed without using bedrails?: A Lot Help needed moving from lying on your back to sitting on the side of a flat bed without using bedrails?: A Lot Help needed moving to and from a bed to a chair (including a wheelchair)?: A Lot Help needed standing up from a chair using your arms (e.g., wheelchair or bedside chair)?: A Lot Help needed to walk in hospital room?: Total Help needed climbing 3-5 steps with a railing? : Total 6 Click Score: 10    End of Session Equipment Utilized During Treatment: Gait belt Activity Tolerance: Patient tolerated treatment well Patient left: in  bed;with family/visitor present;with nursing/sitter in room;with restraints reapplied;with bed alarm set (posey reapplied) Nurse Communication: Mobility status PT Visit Diagnosis: Other abnormalities of gait and mobility (R26.89);Muscle weakness (generalized) (M62.81)     Time: 9628-3662 PT Time Calculation (min) (ACUTE ONLY): 17 min  Charges:  $Neuromuscular Re-education: 8-22 mins                     Kittie Plater, PT, DPT Acute Rehabilitation Services Secure chat preferred Office #: [phone]    Berline Lopes 11/09/2021, 12:55 PM

## 2021-11-10 DIAGNOSIS — E43 Unspecified severe protein-calorie malnutrition: Secondary | ICD-10-CM | POA: Diagnosis not present

## 2021-11-10 DIAGNOSIS — G931 Anoxic brain damage, not elsewhere classified: Secondary | ICD-10-CM | POA: Diagnosis not present

## 2021-11-10 DIAGNOSIS — J9601 Acute respiratory failure with hypoxia: Secondary | ICD-10-CM | POA: Diagnosis not present

## 2021-11-10 DIAGNOSIS — G9340 Encephalopathy, unspecified: Secondary | ICD-10-CM | POA: Diagnosis not present

## 2021-11-10 LAB — GLUCOSE, CAPILLARY
Glucose-Capillary: 100 mg/dL — ABNORMAL HIGH (ref 70–99)
Glucose-Capillary: 114 mg/dL — ABNORMAL HIGH (ref 70–99)
Glucose-Capillary: 117 mg/dL — ABNORMAL HIGH (ref 70–99)
Glucose-Capillary: 124 mg/dL — ABNORMAL HIGH (ref 70–99)
Glucose-Capillary: 125 mg/dL — ABNORMAL HIGH (ref 70–99)
Glucose-Capillary: 129 mg/dL — ABNORMAL HIGH (ref 70–99)
Glucose-Capillary: 92 mg/dL (ref 70–99)

## 2021-11-10 LAB — AMMONIA: Ammonia: 39 umol/L — ABNORMAL HIGH (ref 9–35)

## 2021-11-10 MED ORDER — LIDOCAINE 5 % EX PTCH
1.0000 | MEDICATED_PATCH | CUTANEOUS | Status: DC
  Administered 2021-11-10 – 2021-11-30 (×18): 1 via TRANSDERMAL
  Filled 2021-11-10 (×20): qty 1

## 2021-11-10 MED ORDER — DIPHENHYDRAMINE HCL 12.5 MG/5ML PO ELIX
12.5000 mg | ORAL_SOLUTION | Freq: Four times a day (QID) | ORAL | Status: DC | PRN
  Administered 2021-11-10 – 2021-11-14 (×3): 12.5 mg via ORAL
  Filled 2021-11-10 (×5): qty 5

## 2021-11-10 MED ORDER — LORAZEPAM 2 MG/ML IJ SOLN
1.0000 mg | Freq: Once | INTRAMUSCULAR | Status: AC
  Administered 2021-11-10: 1 mg via INTRAVENOUS
  Filled 2021-11-10: qty 1

## 2021-11-10 NOTE — Progress Notes (Signed)
Speech Language Pathology Treatment: Cognitive-Linquistic  Patient Details Name: [name] MRN: [id number] DOB: [birth year] Today's Date: 11/10/2021 Time: 2725-3664 SLP Time Calculation (min) (ACUTE ONLY): 15 min  Assessment / Plan / Recommendation Clinical Impression  Pt seen up in chair with sitter assisting him. Pt progressively restless through session, grimacing, decreasing ability to attend through a 10 minute period. Sitter says he is often tearful. Pt initially followed commands to stand and sit though 3 repetitions needed with eye contact. Pt refused PO, when asked why pt reported "no, I can't, I have really bad reflux, Ive always had it. My stomach hurts." SLP attempted to elicit pt attention to a deck of cards and one card, with visual, verbal and tactile cues, but by that point pt unable. Frequency of focused attention events improving, expression of wants and needs improving, pt more verbal than ever. Will continue efforts.   HPI HPI: 56 year old male with past medical history significant for polysubstance abuse, hepatitis C, and GERD who presented to [facility] in [place] on 9/6 after being found down. He was bagged in route to the emergency department at which he arrived around 2300 hrs.  He was intubated immediately upon arrival to the ED 9/6.  CT of the head demonstrated cerebral edema without loss of white matter differentiation.  MRI shows Multifocal, primarily small acute infarcts involving bilateral cerebral hemispheres.. Extubated 9/18.      SLP Plan  Continue with current plan of care      Recommendations for follow up therapy are one component of a multi-disciplinary discharge planning process, led by the attending physician.  Recommendations may be updated based on patient status, additional functional criteria and insurance authorization.    Recommendations                   Plan: Continue with current plan of care           Bayli Quesinberry, Katherene Ponto  11/10/2021, 12:14 PM

## 2021-11-10 NOTE — Plan of Care (Signed)

## 2021-11-10 NOTE — Progress Notes (Signed)
PROGRESS NOTE    [name]  [id number]:[id number] DOB: 1965-03-30 DOA: 10/21/2021 PCP: [name], MD   Brief Narrative:  56 year old WM PMHx Polysubstance abuse, Hepatitis C,    Presented to [facility] in [place] on 9/6 after being found down.  He was last known well at approximately 13:00 hours that day however EMS was called for wellness check at some time later on.  He was found to be down and unresponsive.  On EMS evaluation the patient was apneic with thready pulse.  He did not responded to intranasal Narcan.  He was bagged in route to the emergency department and subsequently intubated on arrival to the ED.  CT head demonstrated cerebral edema without loss of white matter differentiation.  He was transferred to [facility] for MRI.  On 9/11 he was noted to have upper extremity edema on the right.  Ultrasound demonstrated acute thrombosis in the mid and distal basilic vein.  Hospital course also complicated by aspiration pneumonia.  Subsequently transferred to [facility] on 9/13.  Events:  9/6 found down > present to [facility] ED. CT with bilateral cerebellar edema. 9/7 repeat CT normal Unable to be weaned from sedation 9/11 RUE DVT started on tx dose lovenox 9/13 transfer to [facility] for MRI 9/14 chest tube placed with intrapleural fibrinolytic administration 9/16 chest tube removed 9/17 patient extubated, remained agitated and started on precedex 9/19 Cortrak placed     Assessment & Plan:   Principal Problem:   Acute encephalopathy Active Problems:   Fever   Pleural effusion   Deep venous thrombosis (HCC)   Acute respiratory failure with hypoxia (HCC)   Cerebrovascular accident (CVA) ([place])   Protein-calorie malnutrition, severe   Anoxic brain injury ([place])  Acute toxic metabolic encephalopathy in the setting of ischemic stroke: -On high-dose thiamine -In the setting of acute stroke. -LP performed, meningitis panel was negative. -UDS was positive for  cocaine. -EEG-persistently showed no epileptiform activity -Per neurology high suspicion for anoxic brain injury versus toxin induced encephalopathy and ischemic strokes. Neurology recommend continue with thiamine, no immediate indication for addition of antiplatelets for secondary stroke prevention as patient is on therapeutic Lovenox.  When patient is off of anticoagulation, aspirin should be started.   2-Multifocal cerebral infarcts: -Neurology recommended to start aspirin when patient is off of anticoagulation -Currently on Lovenox for treatment of vein thrombosis.   3-Acute hypoxic respiratory failure secondary to aspiration pneumonia Right-sided aspiration pneumonia with parapneumonic effusion: -Status post chest tube placement with intrapleural lytics on 9/14, chest tube removed 9/16. -Completed 7 days of IV Zosyn -Intubated on admission outside hospital 9/6. -Extubated 9/17. -Currently on Room air.    Superficial vein thrombosis Diagnosed 2/29 with right basilic vein thrombus -Due to increase for progression of thrombosis patient was started on Lovenox after previous hospitalist discussed with oncology Dr. Alen Blew.  Dysphagia/nutrition: Patient is being followed by SLP, they have now advanced him to dysphagia 3 diet.  He is on nocturnal tube feeds.  Per nursing report, his p.o. intake is not adequate.  SLP following.   Thrombocytosis: We need to add aspirin once off of anticoagulation. Monitor.    Mild transaminases, history of hepatitis C, improved.   Diarrhea: Likely due to tube feedings.  Resolved.  DVT prophylaxis:   Lovenox   Code Status: Full Code  Family Communication: None at bedside today.  Status is: Inpatient Remains inpatient appropriate because: Needs placement.   Estimated body mass index is 22.4 kg/m as calculated from the  following:   Height as of this encounter: 5\' 10"  (1.778 m).   Weight as of this encounter: 70.8 kg.    Nutritional  Assessment: Body mass index is 22.4 kg/m. Seen by dietician.  I agree with the assessment and plan as outlined below: Nutrition Status: Nutrition Problem: Severe Malnutrition Etiology: social / environmental circumstances (drug abuse) Signs/Symptoms: severe fat depletion, severe muscle depletion Interventions: Tube feeding  . Skin Assessment: I have examined the patient's skin and I agree with the wound assessment as performed by the wound care RN as outlined below:    Consultants:  Neurology Palliative care  Procedures:  As above  Antimicrobials:  Anti-infectives (From admission, onward)    Start     Dose/Rate Route Frequency Ordered Stop   10/23/21 1000  cefTRIAXone (ROCEPHIN) 2 g in sodium chloride 0.9 % 100 mL IVPB  Status:  Discontinued        2 g 200 mL/hr over 30 Minutes Intravenous Every 24 hours 10/22/21 0036 10/22/21 0037   10/23/21 1000  cefTRIAXone (ROCEPHIN) 2 g in sodium chloride 0.9 % 100 mL IVPB  Status:  Discontinued        2 g 200 mL/hr over 30 Minutes Intravenous Every 24 hours 10/22/21 0037 10/22/21 0827   10/22/21 0930  piperacillin-tazobactam (ZOSYN) IVPB 3.375 g        3.375 g 12.5 mL/hr over 240 Minutes Intravenous Every 8 hours 10/22/21 0833 10/29/21 0559   10/22/21 0915  ceFEPIme (MAXIPIME) 2 g in sodium chloride 0.9 % 100 mL IVPB  Status:  Discontinued        2 g 200 mL/hr over 30 Minutes Intravenous Every 8 hours 10/22/21 0827 10/22/21 0833         Subjective:  Seen and examined. No complaints. Alert but not oriented at his baseline. Objective: Vitals:   11/09/21 1934 11/10/21 0029 11/10/21 0446 11/10/21 0827  BP: (!) 138/96 (!) 155/122 (!) 115/92 106/82  Pulse: (!) 102 86 84 89  Resp: 18 16 18 16   Temp: 98.2 F (36.8 C) (!) 97.4 F (36.3 C) (!) 97.4 F (36.3 C) 97.8 F (36.6 C)  TempSrc: Oral Oral Oral   SpO2: 98% 99% 98% 100%  Weight:      Height:        Intake/Output Summary (Last 24 hours) at 11/10/2021 0844 Last data  filed at 11/10/2021 0420 Gross per 24 hour  Intake 522 ml  Output 300 ml  Net 222 ml    Filed Weights   11/06/21 0500 11/07/21 0500 11/08/21 0500  Weight: 72.5 kg 69.5 kg 70.8 kg    Examination:  General exam: Appears calm and comfortable  Respiratory system: Clear to auscultation. Respiratory effort normal. Cardiovascular system: S1 & S2 heard, RRR. No JVD, murmurs, rubs, gallops or clicks. No pedal edema. Gastrointestinal system: Abdomen is nondistended, soft and nontender. No organomegaly or masses felt. Normal bowel sounds heard. Central nervous system: Alert but not oriented.  No focal deficit. Extremities: Symmetric 5 x 5 power. Skin: No rashes, lesions or ulcers.   Data Reviewed: I have personally reviewed following labs and imaging studies  CBC: Recent Labs  Lab 11/04/21 0717 11/05/21 0559 11/06/21 0451  WBC 10.2 9.4 10.1  NEUTROABS 7.0 5.5 6.4  HGB 13.4 13.6 13.5  HCT 39.4 40.8 40.0  MCV 94.0 94.2 94.6  PLT 541* 528* 414*    Basic Metabolic Panel: Recent Labs  Lab 11/04/21 0717 11/05/21 0559 11/06/21 0451  NA 138  --   --  K 3.8  --   --   CL 101  --   --   CO2 27  --   --   GLUCOSE 122*  --   --   BUN 20  --   --   CREATININE 0.79  --   --   CALCIUM 9.4  --   --   MG 2.3 2.3 2.3  PHOS 4.2 3.7 4.1    GFR: Estimated Creatinine Clearance: 102 mL/min (by C-G formula based on SCr of 0.79 mg/dL). Liver Function Tests: Recent Labs  Lab 11/04/21 0717  AST 33  ALT 69*  ALKPHOS 117  BILITOT 0.3  PROT 6.8  ALBUMIN 2.9*    No results for input(s): "LIPASE", "AMYLASE" in the last 168 hours. Recent Labs  Lab 11/06/21 0451 11/07/21 1000 11/08/21 0730 11/09/21 0256 11/10/21 0438  AMMONIA 36* 46* 45* 17 39*    Coagulation Profile: No results for input(s): "INR", "PROTIME" in the last 168 hours. Cardiac Enzymes: No results for input(s): "CKTOTAL", "CKMB", "CKMBINDEX", "TROPONINI" in the last 168 hours.  BNP (last 3 results) No results for  input(s): "PROBNP" in the last 8760 hours. HbA1C: No results for input(s): "HGBA1C" in the last 72 hours. CBG: Recent Labs  Lab 11/09/21 2040 11/10/21 0024 11/10/21 0352 11/10/21 0449 11/10/21 0825  GLUCAP 98 117* 129* 125* 124*    Lipid Profile: No results for input(s): "CHOL", "HDL", "LDLCALC", "TRIG", "CHOLHDL", "LDLDIRECT" in the last 72 hours. Thyroid Function Tests: No results for input(s): "TSH", "T4TOTAL", "FREET4", "T3FREE", "THYROIDAB" in the last 72 hours. Anemia Panel: No results for input(s): "VITAMINB12", "FOLATE", "FERRITIN", "TIBC", "IRON", "RETICCTPCT" in the last 72 hours. Sepsis Labs: No results for input(s): "PROCALCITON", "LATICACIDVEN" in the last 168 hours.  No results found for this or any previous visit (from the past 240 hour(s)).    Radiology Studies: No results found.  Scheduled Meds:  amLODipine  5 mg Oral Daily   enoxaparin (LOVENOX) injection  70 mg Subcutaneous Q12H   feeding supplement  237 mL Oral TID BM   feeding supplement (OSMOLITE 1.5 CAL)  1,000 mL Per Tube Q24H   feeding supplement (PROSource TF20)  60 mL Per Tube Daily   fiber supplement (BANATROL TF)  60 mL Per Tube BID   free water  150 mL Per Tube Q4H   lidocaine  1 patch Transdermal Q24H   melatonin  5 mg Per Tube QHS   mouth rinse  15 mL Mouth Rinse 4 times per day   pantoprazole  40 mg Per Tube Daily   thiamine  100 mg Per Tube Daily   Continuous Infusions:     LOS: 20 days   Hughie Closs, MD [facility]  11/10/2021, 8:44 AM   *Please note that this is a verbal dictation therefore any spelling or grammatical errors are due to the "[employer] One" system interpretation.  Please page via Amion and do not message via secure chat for urgent patient care matters. Secure chat can be used for non urgent patient care matters.  How to contact the [facility] Attending or Consulting provider 7A - 7P or covering provider during after hours 7P -7A, for this patient?   Check the care team in [facility] and look for a) attending/consulting TR[facility] provider listed and b) the [facility] team listed. Page or secure chat 7A-7P. Log into [email or web address] and use [place]'s universal password to access. If you do not have the password, please contact the hospital operator. Locate the [facility] provider  you are looking for under [facility] and page to a number that you can be directly reached. If you still have difficulty reaching the provider, please page the [facility] (Director on Call) for the Hospitalists listed on amion for assistance.

## 2021-11-10 NOTE — Progress Notes (Signed)
Palliative Medicine Inpatient Follow Up Note HPI: 56 year old WM PMHx Polysubstance abuse, Hepatitis C, and GERD.   Presented to [facility] in [place] on 9/6 after being found down.  He was last known well at approximately 13:00 hours that day however EMS was called for wellness check at some time later on.  He was found to be down and unresponsive.  On EMS evaluation the patient was apneic with thready pulse.  He did not responded to intranasal Narcan.  He was bagged in route to the emergency department and subsequently intubated on arrival to the ED.  CT head demonstrated cerebral edema without loss of white matter differentiation.  He was transferred to [facility] for MRI.  On 9/11 he was noted to have upper extremity edema on the right.  Ultrasound demonstrated acute thrombosis in the mid and distal basilic vein.  Hospital course also complicated by aspiration pneumonia.  Subsequently transferred to [facility] on 9/13.     Palliative care was asked to get involved to further address goals of care in the setting of an acute stroke.  Today's Discussion 11/10/2021  *Please note that this is a verbal dictation therefore any spelling or grammatical errors are due to the "[place] [employer]" system interpretation.  Chart reviewed inclusive of vital signs, progress notes, laboratory results, and diagnostic images.  Patient assessed at the bedside today.  He is less communicative today, attempts a small smile and then repeats "sleepy" when I ask if he is feeling sleepy.  No family present during my visit.  I then called patient's daughter [name] and she requested we speak later.  I then called again and provided updates on ongoing poor nutritional intake, fluctuating energy.  She states that she understands this and as previously discussed, she remains agreeable to a PEG if/when indicated.  She does not want her father to starve to death.  We also discussed her desire for  patient to receive acute inpatient rehab after medically stable.  She tells me she has not talked to her social worker this whole admission and she would like to begin the conversation.  Offered to reach out and advocate for this.  Unfortunately, she and several of her other family members are feeling sick with respiratory symptoms and she will not be able to visit likely until next week.  Of course, if she is feeling better she will try to visit this week.  The Palliative team will continue supporting [name] during this time.   Objective Assessment: Vital Signs Vitals:   11/10/21 0827 11/10/21 1232  BP: 106/82 109/86  Pulse: 89 86  Resp: 16 20  Temp: 97.8 F (36.6 C) 97.8 F (36.6 C)  SpO2: 100% 98%    Intake/Output Summary (Last 24 hours) at 11/10/2021 1418 Last data filed at 11/10/2021 0420 Gross per 24 hour  Intake 522 ml  Output 300 ml  Net 222 ml   Gen: Older Caucasian male, disheveled HEENT: Cortrack in place CV: Regular rate and regular rhythm  PULM: On room air breathing is even and nonlabored ABD: soft/nontender  EXT: No edema  Neuro: Awakens though does not follow commands  SUMMARY OF RECOMMENDATIONS   -Continue full code/Full scope of care   -Patient's daughter consents to a PEG tube given ongoing poor nutrition -Patient's daughter remains very interested in discharge to acute inpatient rehab when stable -Reached out to [facility] to share patient's daughter would like to speak with them in anticipation of upcoming discharge planning -  Psychosocial emotional support provided -PMT will continue to follow and support incrementally   Billing based on MDM: High ______________________________________________________________________________________ [name] [facility] Palliative Medicine Team Team Cell Phone: [phone] Please utilize secure chat with additional questions, if there is no response within 30 minutes please call the above phone number   Palliative  Medicine Team providers are available by phone from 7am to 7pm daily and can be reached through the team cell phone.  Should this patient require assistance outside of these hours, please call the patient's attending physician.

## 2021-11-10 NOTE — Progress Notes (Signed)
Nutrition Follow-up  DOCUMENTATION CODES:  Severe malnutrition in context of social or environmental circumstances  INTERVENTION:  Continue current diet as recommend per SLP Ensure Enlive po TID, each supplement provides 350 kcal and 20 grams of protein. Continue nocturnal TF via cortrak to meet 75% of needs. Osmolite 1.5 at 34mL/h (1080L/d) x 12h Prosource TF20 1x/d Free water flush 16mL q4h This provides 1700kcal, 88g protein, and 1761mL free water Banatrol BID via tube Appetite remains poor, would recommend placement of PEG as intake is not improving.   NUTRITION DIAGNOSIS:  Severe Malnutrition related to social / environmental circumstances (drug abuse) as evidenced by severe fat depletion, severe muscle depletion. - remains applicable  GOAL:  Patient will meet greater than or equal to 90% of their needs - progressing  MONITOR:  TF tolerance, Diet advancement, Labs  REASON FOR ASSESSMENT:  Consult Assessment of nutrition requirement/status (Patient with Diarrhea, should we change formula?)  ASSESSMENT:   56 yo male admitted to [facility] after being found down. Transferred to [facility] with concerns for anoxic brain injury (CT showed bilateral cerebellar edema). PMH includes polysubstance abuse, hepatitis C, GERD, smoker.  9/13 - admitted from outside facility intubated 9/14 - TF initiated 9/18 - extubated, SLP eval, recommended NPO 9/19 - cortrak placed 9/27 - MBS, advanced to DYS3/Thins, IR evaluated and found anatomy is amenable to g-tube if needed 9/29 - TF were adjusted to nocturnal  Pt resting in bed, eyes are open but will not respond to questions. Lunch tray at bedside untouched. NT reports he also refused breakfast, has only had a few sips of fluids today.   TF were adjusted to nocturnal on Friday to encourage PO intake. Appetite has not improved and if anything intake has worsened. Pt would likely benefit from a PEG tube placement until intake is adequate to  support needs. Noted that palliative care is attempting to meet with family to continue [place] discussions.  Average Meal Intake: 9/27-9/29: 23% average intake x 4 recorded meals 9/30-10/3: 20% intake x 8 recorded meals   Intake/Output Summary (Last 24 hours) at 11/10/2021 1558 Last data filed at 11/10/2021 0420 Gross per 24 hour  Intake 522 ml  Output 300 ml  Net 222 ml  Net IO Since Admission: -9,685.06 mL [11/10/21 1558]  Nutritionally Relevant Medications: Scheduled Meds:  PROSource TF20  60 mL Per Tube Daily   BANATROL TF  60 mL Per Tube BID   free water  150 mL Per Tube Q4H   pantoprazole  40 mg Per Tube Daily   thiamine  100 mg Per Tube Daily   Continuous Infusions:  feeding supplement (JEVITY 1.5 CAL/FIBER) 1,000 mL (11/04/21 0358)   PRN Meds: docusate sodium, polyethylene glycol  Labs Reviewed: CBG ranges from 112-161mg /dL over the last 24 hours  NUTRITION - FOCUSED PHYSICAL EXAM: Flowsheet Row Most Recent Value  Orbital Region Mild depletion  Upper Arm Region Severe depletion  Thoracic and Lumbar Region Severe depletion  Buccal Region Mild depletion  Temple Region Mild depletion  Clavicle Bone Region Severe depletion  Clavicle and Acromion Bone Region Severe depletion  Scapular Bone Region Mild depletion  Dorsal Hand Severe depletion  Patellar Region Severe depletion  Anterior Thigh Region Severe depletion  Posterior Calf Region Severe depletion  Edema (RD Assessment) None  Hair Reviewed  [tangled, unkept]  Eyes Reviewed  Mouth Reviewed  Skin Reviewed  Nails Reviewed   Diet Order:   Diet Order  DIET DYS 3 Room service appropriate? No; Fluid consistency: Thin  Diet effective now                   EDUCATION NEEDS:  Not appropriate for education at this time  Skin:  Skin Assessment: Reviewed RN Assessment  Last BM:  9/30  Height:  Ht Readings from Last 1 Encounters:  10/21/21 5\' 10"  (1.778 m)    Weight:  Wt Readings from Last  1 Encounters:  11/08/21 70.8 kg    Ideal Body Weight:  75.5 kg  BMI:  Body mass index is 22.4 kg/m.  Estimated Nutritional Needs:  Kcal:  2200-2400 Protein:  110-120 gm Fluid:  2.2-2.4 L   Ranell Patrick, RD, LDN Clinical Dietitian RD pager # available in [place]  After hours/weekend pager # available in [facility]

## 2021-11-11 DIAGNOSIS — G9340 Encephalopathy, unspecified: Secondary | ICD-10-CM | POA: Diagnosis not present

## 2021-11-11 LAB — GLUCOSE, CAPILLARY
Glucose-Capillary: 105 mg/dL — ABNORMAL HIGH (ref 70–99)
Glucose-Capillary: 108 mg/dL — ABNORMAL HIGH (ref 70–99)
Glucose-Capillary: 111 mg/dL — ABNORMAL HIGH (ref 70–99)
Glucose-Capillary: 116 mg/dL — ABNORMAL HIGH (ref 70–99)
Glucose-Capillary: 123 mg/dL — ABNORMAL HIGH (ref 70–99)
Glucose-Capillary: 98 mg/dL (ref 70–99)

## 2021-11-11 LAB — AMMONIA: Ammonia: <10 umol/L (ref 9–35)

## 2021-11-11 MED ORDER — OXYCODONE HCL 5 MG PO TABS: 10.0000 mg | ORAL_TABLET | ORAL | Status: DC | PRN

## 2021-11-11 MED ORDER — PANTOPRAZOLE 2 MG/ML SUSPENSION
40.0000 mg | Freq: Every day | ORAL | Status: DC
  Administered 2021-11-12 – 2021-11-18 (×7): 40 mg via ORAL
  Filled 2021-11-11 (×7): qty 20

## 2021-11-11 MED ORDER — ACETAMINOPHEN 325 MG PO TABS
650.0000 mg | ORAL_TABLET | Freq: Four times a day (QID) | ORAL | Status: DC | PRN
  Administered 2021-11-12 – 2021-11-30 (×17): 650 mg via ORAL
  Filled 2021-11-11 (×16): qty 2

## 2021-11-11 MED ORDER — THIAMINE MONONITRATE 100 MG PO TABS
100.0000 mg | ORAL_TABLET | Freq: Every day | ORAL | Status: DC
  Administered 2021-11-12 – 2021-11-30 (×19): 100 mg via ORAL
  Filled 2021-11-11 (×19): qty 1

## 2021-11-11 MED ORDER — BANATROL TF EN LIQD
60.0000 mL | Freq: Two times a day (BID) | ENTERAL | Status: DC
  Administered 2021-11-11: 60 mL via ORAL
  Filled 2021-11-11 (×3): qty 60

## 2021-11-11 MED ORDER — MELATONIN 5 MG PO TABS
5.0000 mg | ORAL_TABLET | Freq: Every day | ORAL | Status: DC
  Administered 2021-11-11 – 2021-11-29 (×19): 5 mg via ORAL
  Filled 2021-11-11 (×19): qty 1

## 2021-11-11 NOTE — Progress Notes (Addendum)
  Inpatient Rehab Admissions Coordinator :  Per [facility] SW request per daughter, screened for CIR candidacy by Danne Baxter RN MSN. Therapy recommendations are for SNF.I recommend therapy discuss with daughter so she is able to understand their recommendation.  Danne Baxter RN MSN Admissions Coordinator [phone]

## 2021-11-11 NOTE — Progress Notes (Signed)
Patient removed his cortrak, MD aware

## 2021-11-11 NOTE — TOC Initial Note (Signed)
Transition of Care [facility]) - Initial/Assessment Note    Patient Details  Name: [name] MRN: [id number] Date of Birth: 11-Jan-1966  Transition of Care [facility]) CM/SW Contact:    Geralynn Ochs, LCSW Phone Number: 11/11/2021, 5:17 PM  Clinical Narrative:          CSW contacted patient's daughter, [name], to discuss disposition concerns and answer questions. [name] indicated that she was aware of SNF recommendation, but is more interested in CIR, wondering what needed to happen for that to be pursued. CSW explained discussing with PT and the expectation that patient will likely be wheelchair bound and will need 24/7 assist at discharge, and [name] said that she cannot provide that and there is no other family who would be able to assist. [name] had questions about why that is the expectation, and CSW unable to answer; will ask PT to contact patient's daughter to answer questions she has about prognosis. [name] is hopeful for the patient to get as much aggressive rehab as possible to give him the best possible outcome. CSW answered questions that [name] had about SNF placement, if that needs to be pursued, but she is hopeful to fully exhaust possibility of CIR before looking into SNF placement. CSW to follow.     Expected Discharge Plan: Skilled Nursing Facility Barriers to Discharge: Continued Medical Work up, [facility] will not accept until restraint criteria met, Requiring sitter/restraints   Patient Goals and CMS Choice Patient states their goals for this hospitalization and ongoing recovery are:: patient unable to participate in goal setting, not fully oriented CMS Medicare.gov Compare Post Acute Care list provided to:: Patient Represenative (must comment) Choice offered to / list presented to : Adult Children  Expected Discharge Plan and Services Expected Discharge Plan: [place] Acute Care Choice: IP Rehab Living arrangements for the  past 2 months: Single Family Home                                      Prior Living Arrangements/Services Living arrangements for the past 2 months: Single Family Home Lives with:: Self Patient language and need for interpreter reviewed:: No        Need for Family Participation in Patient Care: Yes (Comment) Care giver support system in place?: No (comment)   Criminal Activity/Legal Involvement Pertinent to Current Situation/Hospitalization: No - Comment as needed  Activities of Daily Living      Permission Sought/Granted Permission sought to share information with : Family Supports Permission granted to share information with : Yes, Verbal Permission Granted  Share Information with NAME: [name]     Permission granted to share info w Relationship: Daughter     Emotional Assessment   Attitude/Demeanor/Rapport: Unable to Assess Affect (typically observed): Unable to Assess Orientation: : Oriented to Self Alcohol / Substance Use: Illicit Drugs Psych Involvement: No (comment)  Admission diagnosis:  Acute encephalopathy [G93.40] Patient Active Problem List   Diagnosis Date Noted   Protein-calorie malnutrition, severe 10/31/2021   Anoxic brain injury ([place]) 10/31/2021   Acute respiratory failure with hypoxia (HCC)    Cerebrovascular accident (CVA) ([place])    Acute encephalopathy 10/22/2021   Fever    Pleural effusion    Deep venous thrombosis (HCC)    Pain and swelling of left lower leg 12/28/2018   Septic arthritis of shoulder, right ([place]) 09/07/2018   Septic arthritis of sacroiliac  joint ([place]) 09/07/2018   Sternoclavicular joint pain, left 09/07/2018   Discitis of lumbosacral region 06/28/2018   Psoas abscess, right ([place]) 06/28/2018   Polysubstance abuse ([place]) 06/24/2018   Normocytic anemia 06/24/2018   Recurrent boils 06/24/2018   Head injury    Septic arthritis of knee, left (HCC)    Acute medial meniscal tear, left, initial encounter    GERD  (gastroesophageal reflux disease) 06/22/2018   Mild protein-calorie malnutrition ([place]) 06/22/2018   AKI (acute kidney injury) ([place]) 06/21/2018   Spinal stenosis of lumbosacral region 06/21/2018   Chronic hepatitis C ([place]) 12/17/2013   Cigarette smoker 12/17/2013   Family history of diabetes mellitus (DM) 12/17/2013   Notalgia 12/17/2013   PCP:  [name], MD Pharmacy:   [facility], [place] [address] Phone: [phone] Fax: [phone]  [place], [place] - [address] [place] [address] Phone: [phone] Fax: [phone]  [facility] [address] [place] [address] Phone: [phone] Fax: [phone]     Social Determinants of Health (SDOH) Interventions    Readmission Risk Interventions     No data to display

## 2021-11-11 NOTE — Plan of Care (Signed)
  Problem: Clinical Measurements: Goal: Will remain free from infection Outcome: Progressing   Problem: Activity: Goal: Risk for activity intolerance will decrease Outcome: Progressing   Problem: Nutrition: Goal: Adequate nutrition will be maintained Outcome: Progressing   Problem: Coping: Goal: Level of anxiety will decrease Outcome: Progressing   

## 2021-11-11 NOTE — Progress Notes (Signed)
PROGRESS NOTE    [name]  XVQ:[id number] DOB: Jan 12, 1966 DOA: 10/21/2021 PCP: [name], MD   Brief Narrative:  56 year old WM PMHx Polysubstance abuse, Hepatitis C,    Presented to [facility] in [place] on 9/6 after being found down.  He was last known well at approximately 13:00 hours that day however EMS was called for wellness check at some time later on.  He was found to be down and unresponsive.  On EMS evaluation the patient was apneic with thready pulse.  He did not responded to intranasal Narcan.  He was bagged in route to the emergency department and subsequently intubated on arrival to the ED.  CT head demonstrated cerebral edema without loss of white matter differentiation.  He was transferred to [facility] for MRI.  On 9/11 he was noted to have upper extremity edema on the right.  Ultrasound demonstrated acute thrombosis in the mid and distal basilic vein.  Hospital course also complicated by aspiration pneumonia.  Subsequently transferred to [facility] on 9/13.  Events:  9/6 found down > present to [facility] ED. CT with bilateral cerebellar edema. 9/7 repeat CT normal Unable to be weaned from sedation 9/11 RUE DVT started on tx dose lovenox 9/13 transfer to [facility] for MRI 9/14 chest tube placed with intrapleural fibrinolytic administration 9/16 chest tube removed 9/17 patient extubated, remained agitated and started on precedex 9/19 Cortrak placed  10/4 Attempting to wean cortrak (increase PO intake more liberally over the next 24h per SLP recs) - once cortrak removed can start having reasonable discussion on disposition.   Assessment & Plan:   Principal Problem:   Acute encephalopathy Active Problems:   Fever   Pleural effusion   Deep venous thrombosis (HCC)   Acute respiratory failure with hypoxia (HCC)   Cerebrovascular accident (CVA) ([place])   Protein-calorie malnutrition, severe   Anoxic brain injury ([place])  Acute toxic metabolic  encephalopathy in the setting of ischemic stroke: -On high-dose thiamine -In the setting of acute stroke. -LP performed, meningitis panel was negative. -UDS was positive for cocaine. -EEG-persistently showed no epileptiform activity -Per neurology high suspicion for anoxic brain injury versus toxin induced encephalopathy and ischemic strokes. -Neurology recommend continue with thiamine, no immediate indication for addition of antiplatelets for secondary stroke prevention as patient is on therapeutic Lovenox.  When patient is off of anticoagulation, aspirin should be started.   Multifocal cerebral infarcts: -Neurology recommended to start aspirin once patient is off of anticoagulation -Currently on Lovenox for treatment of vein thrombosis.  Acute hypoxic respiratory failure secondary to aspiration pneumonia Right-sided aspiration pneumonia with parapneumonic effusion: -Status post chest tube placement with intrapleural lytics on 9/14, chest tube removed 9/16. -Completed 7 days of IV Zosyn -Intubated on admission outside hospital 9/6. -Extubated 9/17. -Currently on Room air.    Superficial vein thrombosis Diagnosed 0/93 with right basilic vein thrombus -Due to increase for progression of thrombosis patient was started on Lovenox after previous hospitalist discussed with oncology Dr. Alen Blew.  Dysphagia/nutrition: Patient is being followed by SLP, they have now advanced him to dysphagia 3 diet.  He is on nocturnal tube feeds.  Per nursing report, his p.o. intake is not adequate.  SLP following.   Thrombocytosis: We need to add aspirin once off of anticoagulation. Monitor.    Mild liver dysfunction/abnormal LFTs History of hepatitis C, improved.   Diarrhea, resolved:  Likely due to tube feedings.   DVT prophylaxis:   Lovenox   Code Status: Full Code  Family Communication: None at bedside today.  Status is: Inpatient Remains inpatient appropriate because: Needs  placement.   Estimated body mass index is 22.4 kg/m as calculated from the following:   Height as of this encounter: 5\' 10"  (1.778 m).   Weight as of this encounter: 70.8 kg.    Nutritional Assessment: Body mass index is 22.4 kg/m. Seen by dietician.  I agree with the assessment and plan as outlined below: Nutrition Status: Nutrition Problem: Severe Malnutrition Etiology: social / environmental circumstances (drug abuse) Signs/Symptoms: severe fat depletion, severe muscle depletion Interventions: Tube feeding  . Skin Assessment: I have examined the patient's skin and I agree with the wound assessment as performed by the wound care RN as outlined below:    Consultants:  Neurology Palliative care  Procedures:  As above  Antimicrobials:  Anti-infectives (From admission, onward)    Start     Dose/Rate Route Frequency Ordered Stop   10/23/21 1000  cefTRIAXone (ROCEPHIN) 2 g in sodium chloride 0.9 % 100 mL IVPB  Status:  Discontinued        2 g 200 mL/hr over 30 Minutes Intravenous Every 24 hours 10/22/21 0036 10/22/21 0037   10/23/21 1000  cefTRIAXone (ROCEPHIN) 2 g in sodium chloride 0.9 % 100 mL IVPB  Status:  Discontinued        2 g 200 mL/hr over 30 Minutes Intravenous Every 24 hours 10/22/21 0037 10/22/21 0827   10/22/21 0930  piperacillin-tazobactam (ZOSYN) IVPB 3.375 g        3.375 g 12.5 mL/hr over 240 Minutes Intravenous Every 8 hours 10/22/21 0833 10/29/21 0559   10/22/21 0915  ceFEPIme (MAXIPIME) 2 g in sodium chloride 0.9 % 100 mL IVPB  Status:  Discontinued        2 g 200 mL/hr over 30 Minutes Intravenous Every 8 hours 10/22/21 0827 10/22/21 0833         Subjective:  Seen and examined. No complaints. Alert but not oriented at his baseline. Objective: Vitals:   11/10/21 1939 11/11/21 0056 11/11/21 0406 11/11/21 0721  BP: 122/87 110/70 (!) 122/90 (!) 116/93  Pulse: 86 83 88 83  Resp: 20 20 18 14   Temp: (!) 97.5 F (36.4 C) 98.5 F (36.9 C) 98.6 F  (37 C) 98 F (36.7 C)  TempSrc: Oral Oral Oral Oral  SpO2: 100% 99% 98% 100%  Weight:      Height:        Intake/Output Summary (Last 24 hours) at 11/11/2021 0738 Last data filed at 11/11/2021 0612 Gross per 24 hour  Intake 60 ml  Output 1775 ml  Net -1715 ml    Filed Weights   11/06/21 0500 11/07/21 0500 11/08/21 0500  Weight: 72.5 kg 69.5 kg 70.8 kg    Examination:  General exam: Appears calm and comfortable  Respiratory system: Clear to auscultation. Respiratory effort normal. Cardiovascular system: S1 & S2 heard, RRR. No JVD, murmurs, rubs, gallops or clicks. No pedal edema. Gastrointestinal system: Abdomen is nondistended, soft and nontender. No organomegaly or masses felt. Normal bowel sounds heard. Central nervous system: Alert but not oriented.  No focal deficit. Extremities: Symmetric 5 x 5 power. Skin: No rashes, lesions or ulcers.   Data Reviewed: I have personally reviewed following labs and imaging studies  CBC: Recent Labs  Lab 11/05/21 0559 11/06/21 0451  WBC 9.4 10.1  NEUTROABS 5.5 6.4  HGB 13.6 13.5  HCT 40.8 40.0  MCV 94.2 94.6  PLT 528* 414*    Basic  Metabolic Panel: Recent Labs  Lab 11/05/21 0559 11/06/21 0451  MG 2.3 2.3  PHOS 3.7 4.1    GFR: Estimated Creatinine Clearance: 102 mL/min (by C-G formula based on SCr of 0.79 mg/dL). Liver Function Tests: No results for input(s): "AST", "ALT", "ALKPHOS", "BILITOT", "PROT", "ALBUMIN" in the last 168 hours.  No results for input(s): "LIPASE", "AMYLASE" in the last 168 hours. Recent Labs  Lab 11/07/21 1000 11/08/21 0730 11/09/21 0256 11/10/21 0438 11/11/21 0527  AMMONIA 46* 45* 17 39* <10    Coagulation Profile: No results for input(s): "INR", "PROTIME" in the last 168 hours. Cardiac Enzymes: No results for input(s): "CKTOTAL", "CKMB", "CKMBINDEX", "TROPONINI" in the last 168 hours.  BNP (last 3 results) No results for input(s): "PROBNP" in the last 8760 hours. HbA1C: No  results for input(s): "HGBA1C" in the last 72 hours. CBG: Recent Labs  Lab 11/10/21 1652 11/10/21 1943 11/11/21 0013 11/11/21 0441 11/11/21 0729  GLUCAP 92 114* 123* 105* 116*    Lipid Profile: No results for input(s): "CHOL", "HDL", "LDLCALC", "TRIG", "CHOLHDL", "LDLDIRECT" in the last 72 hours. Thyroid Function Tests: No results for input(s): "TSH", "T4TOTAL", "FREET4", "T3FREE", "THYROIDAB" in the last 72 hours. Anemia Panel: No results for input(s): "VITAMINB12", "FOLATE", "FERRITIN", "TIBC", "IRON", "RETICCTPCT" in the last 72 hours. Sepsis Labs: No results for input(s): "PROCALCITON", "LATICACIDVEN" in the last 168 hours.  No results found for this or any previous visit (from the past 240 hour(s)).    Radiology Studies: No results found.  Scheduled Meds:  amLODipine  5 mg Oral Daily   enoxaparin (LOVENOX) injection  70 mg Subcutaneous Q12H   feeding supplement  237 mL Oral TID BM   feeding supplement (OSMOLITE 1.5 CAL)  1,000 mL Per Tube Q24H   feeding supplement (PROSource TF20)  60 mL Per Tube Daily   fiber supplement (BANATROL TF)  60 mL Per Tube BID   free water  150 mL Per Tube Q4H   lidocaine  1 patch Transdermal Q24H   melatonin  5 mg Per Tube QHS   mouth rinse  15 mL Mouth Rinse 4 times per day   pantoprazole  40 mg Per Tube Daily   thiamine  100 mg Per Tube Daily   Continuous Infusions:     LOS: 21 days   Azucena Fallen, DO [facility]  Available 7a-7p - please see Amion for cross coverage  11/11/2021, 7:38 AM

## 2021-11-12 DIAGNOSIS — G9340 Encephalopathy, unspecified: Secondary | ICD-10-CM | POA: Diagnosis not present

## 2021-11-12 LAB — GLUCOSE, CAPILLARY
Glucose-Capillary: 101 mg/dL — ABNORMAL HIGH (ref 70–99)
Glucose-Capillary: 105 mg/dL — ABNORMAL HIGH (ref 70–99)
Glucose-Capillary: 114 mg/dL — ABNORMAL HIGH (ref 70–99)

## 2021-11-12 LAB — AMMONIA: Ammonia: 84 umol/L — ABNORMAL HIGH (ref 9–35)

## 2021-11-12 MED ORDER — CALCIUM CARBONATE ANTACID 500 MG PO CHEW
1.0000 | CHEWABLE_TABLET | Freq: Two times a day (BID) | ORAL | Status: DC | PRN
  Administered 2021-11-13 – 2021-11-24 (×7): 200 mg via ORAL
  Filled 2021-11-12 (×7): qty 1

## 2021-11-12 MED ORDER — ORAL CARE MOUTH RINSE
15.0000 mL | Freq: Two times a day (BID) | OROMUCOSAL | Status: DC
  Administered 2021-11-13 – 2021-11-15 (×4): 15 mL via OROMUCOSAL

## 2021-11-12 MED ORDER — HALOPERIDOL LACTATE 5 MG/ML IJ SOLN
5.0000 mg | Freq: Once | INTRAMUSCULAR | Status: AC | PRN
  Administered 2021-11-12: 5 mg via INTRAVENOUS
  Filled 2021-11-12: qty 1

## 2021-11-12 NOTE — Plan of Care (Signed)
  Problem: Clinical Measurements: Goal: Cardiovascular complication will be avoided Outcome: Progressing   Problem: Activity: Goal: Risk for activity intolerance will decrease Outcome: Progressing   Problem: Nutrition: Goal: Adequate nutrition will be maintained Outcome: Progressing   Problem: Coping: Goal: Level of anxiety will decrease Outcome: Progressing   

## 2021-11-12 NOTE — Progress Notes (Signed)
Occupational Therapy Treatment Patient Details Name: [name] MRN: [id number] DOB: February 02, 1966 Today's Date: 11/12/2021   History of present illness Pt is 56 yo male admitted on to [facility] in [place] on 9/6 after being found unresponsive.  Pt transferred to [facility] on 10/21/21.  Pt with acute toxic metabolic encephalopathy vs encephalitis in pt with acute ischemic strokes per neurology.  9/11 R UE DVT started on lovenox, 9/14-9/16 chest tube, ETT 9/6-9/18.  Hx of polysubstance abuse, hep C, and GERD.   OT comments  Pt seen in conjunction with PT due to last session he as +2 mod A for all mobility and was unable to ambulate. Today he was able to ambulate with +2 min A Bil HHA, he washed his face with min cues and washcloth placed in line of sight, he doffed his socks but not hands (other foot), he spontaneously sat up on EOB and laid back down several times. He could not tell us what he wanted unless we gave him options and once he chose he did not necessarily want to follow through with his choice. He was not oriented at all. He did make more eye contact today but still remains with only focused attention before he is internally or externally distracted. He will need increased time to make gains in both basic self care tasks and mobility and SNF will give him the time he needs for this areas.His cognitive status may improve as well but not to a point where I feel he will be able to be by himself no matter how much therapy he has due to safety being a big factor and he lacks insight into this. We will continue to follow acutely.   Recommendations for follow up therapy are one component of a multi-disciplinary discharge planning process, led by the attending physician.  Recommendations may be updated based on patient status, additional functional criteria and insurance authorization.    Follow Up Recommendations  Skilled nursing-short term rehab (<3 hours/day)    Assistance Recommended at Discharge  Frequent or constant Supervision/Assistance  Patient can return home with the following  Two people to help with walking and/or transfers;A lot of help with bathing/dressing/bathroom;Assistance with cooking/housework;Assistance with feeding;Help with stairs or ramp for entrance;Assist for transportation;Direct supervision/assist for financial management;Direct supervision/assist for medications management   Equipment Recommendations  Other (comment) (TBD next venue)       Precautions / Restrictions Precautions Precautions: Fall Precaution Comments: posey belt Restrictions Weight Bearing Restrictions: No       Mobility Bed Mobility Overal bed mobility: Needs Assistance Bed Mobility: Supine to Sit, Sit to Supine, Rolling Rolling: Min guard (only rolled to right with [place], would not follow commands to roll left)   Supine to sit: Min guard Sit to supine: Min guard   General bed mobility comments: Pt up to EOB and down to lay 5+ times with [place], without need for physical A (but not on command) all spontaneously    Transfers Overall transfer level: Needs assistance Equipment used: 2 person hand held assist Transfers: Sit to/from Stand Sit to Stand: Min assist, +2 physical assistance           General transfer comment: Required Mod A +2 once for initiation of movement from sitting EOB to standing, the other 3 time he stood it was min A +2 to initiate or did it spontaneously (x1) without regard for safety.     Balance Overall balance assessment: Needs assistance Sitting-balance support: No upper extremity supported, Feet supported  Sitting balance-Leahy Scale: Fair     Standing balance support: Bilateral upper extremity supported Standing balance-Leahy Scale: Poor                             ADL either performed or assessed with clinical judgement   ADL Overall ADL's : Needs assistance/impaired   Eating/Feeding Details (indicate cue type and reason): Would not  attempt to hold a cup or use a spoon to eat   Grooming Details (indicate cue type and reason): Washed his own face with LUE, would not attempt to brush teeth standing at sink once toothbrush was in left hand or in mouth (took it out of his mouth and tossed into sink)               Lower Body Dressing Details (indicate cue type and reason): total A to donn socks, used RLE to doff left sock while seated in recliner. Can easily reach his feet when in bed when he sits up from a flat bed Toilet Transfer: Minimal assistance;+2 for physical assistance Toilet Transfer Details (indicate cue type and reason): Bil HHA simulated recliner>chair 5 feet away>bed>sink>bed Toileting- Clothing Manipulation and Hygiene: Total assistance Toileting - Clothing Manipulation Details (indicate cue type and reason): min A +2 sit<>stand            Extremity/Trunk Assessment Upper Extremity Assessment Upper Extremity Assessment: Overall WFL for tasks assessed RUE Deficits / Details: moves arms without difficulty            Vision   Vision Assessment?: Vision impaired- to be further tested in functional context Additional Comments: Makes intermittent eye contact (some to command and some on his own), sometimes looks at [place] when spoken too but not always (head down) with eye contact at times ~10 seconds before he gets internally or externally distracted. Does not always regard objects right away when asked and when put in his central visual field.          Cognition Arousal/Alertness: Awake/alert Behavior During Therapy: Impulsive, Restless Overall Cognitive Status: Impaired/Different from baseline Area of Impairment: Orientation, Attention, Following commands, Safety/judgement, Awareness, Problem solving                 Orientation Level: Disoriented to, Place, Time (said don't know to place, said 2000 for year, no response to month) Current Attention Level: Focused   Following Commands:  Follows one step commands inconsistently, Follows one step commands with increased time Safety/Judgement: Decreased awareness of safety, Decreased awareness of deficits Awareness: Intellectual Problem Solving: Slow processing, Decreased initiation, Difficulty sequencing, Requires verbal cues, Requires tactile cues General Comments: Pt followed command to wash face once washcloth placed in his hand, followed command to give me the washcloth back once I reached for it. Did not follow command for brushing teeth or putting on socks. Rubbed lotion on left foot that was itching once lotion placed on foot and had to be cued (verbal and gestural) to rub in lotion on right foot. When asked what he wanted to do after he kept sitting up and laying down in bed he could not answer, gave him choices and he said "yes, I need to do that" to brushing teeth, but then would not do it once we got to sink even with cues.                   Pertinent Vitals/ Pain       Pain Assessment  Pain Assessment: Faces Faces Pain Scale: No hurt         Frequency  Min 2X/week        Progress Toward Goals  OT Goals(current goals can now be found in the care plan section)  Progress towards OT goals: Progressing toward goals  Acute Rehab OT Goals Patient Stated Goal: said to brush teeth when given choices (could not answer open ended question)--but then would not do it OT Goal Formulation: Patient unable to participate in goal setting Time For Goal Achievement: 11/26/21 Potential to Achieve Goals: Fair  Plan Discharge plan remains appropriate    Co-evaluation    PT/OT/SLP Co-Evaluation/Treatment: Yes Reason for Co-Treatment: Necessary to address cognition/behavior during functional activity;For patient/therapist safety;To address functional/ADL transfers PT goals addressed during session: Mobility/safety with mobility;Balance;Strengthening/ROM OT goals addressed during session: Strengthening/ROM;ADL's and  self-care      AM-PAC OT "6 Clicks" Daily Activity     Outcome Measure   Help from another person eating meals?: Total Help from another person taking care of personal grooming?: A Lot (could wash face but not brush teeth) Help from another person toileting, which includes using toliet, bedpan, or urinal?: A Lot (could ambulate with +2 min A; but not do clothing or peri care for toileting) Help from another person bathing (including washing, rinsing, drying)?: Total (due to not consistently following commands and needing multi-model cues) Help from another person to put on and taking off regular upper body clothing?: Total Help from another person to put on and taking off regular lower body clothing?: A Lot (for socks could doff but would not attempt to donn) 6 Click Score: 9    End of Session Equipment Utilized During Treatment: Gait belt  OT Visit Diagnosis: Unsteadiness on feet (R26.81);Other abnormalities of gait and mobility (R26.89);Other symptoms and signs involving cognitive function   Activity Tolerance  (limited by what he wanted to do, not what we wanted him to try to do due to cognition)   Patient Left in bed;with call bell/phone within reach;with nursing/sitter in room;with restraints reapplied           Time: 1354-1424 OT Time Calculation (min): 30 min  Charges: OT General Charges $OT Visit: 1 Visit OT Treatments $Self Care/Home Management : 8-22 mins  Ignacia Palma, OTR/L Acute Rehab Services [facility] [phone] Office [phone]    Evette Georges 11/12/2021, 2:52 PM

## 2021-11-12 NOTE — Progress Notes (Signed)
Speech Language Pathology Treatment: Cognitive-Linquistic;Dysphagia  Patient Details Name: [name] MRN: [id number] DOB: 02-14-1965 Today's Date: 11/12/2021 Time: 1638-4665 SLP Time Calculation (min) (ACUTE ONLY): 56 min  Assessment / Plan / Recommendation Clinical Impression  Pt again restless, but expressing himself better than ever. Pt responsive to 80% of questions after a delay or with repetition. Pt was often unable to complete his thoughts or requests. Pt appeared to have difficulty with word finding and attention. Pt benefited from choice cues. Pt told me he felt afraid, often asked me if he was going to die and attempted questions regarding medication for pain and anxiety though he needed help to find words in this case. Pt would attempt to use his right hand to pick up his cup and then switched to his left hand when his right could not complete the action. Pt was able to reach for cup, but didn't have awareness it was empty. SLP provided verbal cues for pt to take the step to pour a drink it it, but ultimately pt needed hand over hand assist initiate and complete the task. Pt much more consistent following commands to transfer to chair. Pt allowed himself to be fed 75% of his lunch. No trouble masticating. Will upgrade to regular diet for more variety. Will f/u.   HPI HPI: 56 year old male with past medical history significant for polysubstance abuse, hepatitis C, and GERD who presented to [facility] in [place] on 9/6 after being found down. He was bagged in route to the emergency department at which he arrived around 2300 hrs.  He was intubated immediately upon arrival to the ED 9/6.  CT of the head demonstrated cerebral edema without loss of white matter differentiation.  MRI shows Multifocal, primarily small acute infarcts involving bilateral cerebral hemispheres.. Extubated 9/18.      SLP Plan  Continue with current plan of care      Recommendations for follow up  therapy are one component of a multi-disciplinary discharge planning process, led by the attending physician.  Recommendations may be updated based on patient status, additional functional criteria and insurance authorization.    Recommendations  Diet recommendations: Regular;Thin liquid Liquids provided via: Cup;Straw Medication Administration: Whole meds with liquid Supervision: Trained caregiver to feed patient;Full supervision/cueing for compensatory strategies                Oral Care Recommendations: Oral care BID Follow Up Recommendations: Acute inpatient rehab (3hours/day) Assistance recommended at discharge: Frequent or constant Supervision/Assistance SLP Visit Diagnosis: Cognitive communication deficit (R41.841);Dysphagia, oropharyngeal phase (R13.12) Plan: Continue with current plan of care           Tamee Battin, Katherene Ponto  11/12/2021, 2:19 PM

## 2021-11-12 NOTE — Progress Notes (Signed)
PROGRESS NOTE    [name]  RDE:[id number] DOB: 05-02-1965 DOA: 10/21/2021 PCP: [name], MD   Brief Narrative:  56 year old WM PMHx Polysubstance abuse, Hepatitis C,    Presented to [facility] in [place] on 9/6 after being found down.  He was last known well at approximately 13:00 hours that day however EMS was called for wellness check at some time later on.  He was found to be down and unresponsive.  On EMS evaluation the patient was apneic with thready pulse.  He did not responded to intranasal Narcan.  He was bagged in route to the emergency department and subsequently intubated on arrival to the ED. CT head demonstrated cerebral edema without loss of white matter differentiation. He was transferred to [facility] for MRI. On 9/11 he was noted to have upper extremity edema on the right. Ultrasound demonstrated acute thrombosis in the mid and distal basilic vein. Hospital course also complicated by aspiration pneumonia. Subsequently transferred to [facility] on 9/13.  Events:  9/6 found down > present to [facility] ED. CT with bilateral cerebellar edema. 9/7 repeat CT normal Unable to be weaned from sedation 9/11 RUE DVT started on tx dose lovenox 9/13 transfer to [facility] for MRI 9/14 chest tube placed with intrapleural fibrinolytic administration 9/16 chest tube removed 9/17 patient extubated, remained agitated and started on precedex 9/19 Cortrak placed  10/4 Attempting to wean cortrak (increase PO intake more liberally over the next 24h per SLP recs) - once cortrak removed can start having reasonable discussion on disposition.  10/5 Cortrak accidentally removed - advancing diet as tolerated  Assessment & Plan:   Principal Problem:   Acute encephalopathy Active Problems:   Fever   Pleural effusion   Deep venous thrombosis (HCC)   Acute respiratory failure with hypoxia (HCC)   Cerebrovascular accident (CVA) ([place])   Protein-calorie malnutrition, severe    Anoxic brain injury ([place])  Acute toxic metabolic encephalopathy in the setting of ischemic stroke: -On high-dose thiamine -In the setting of acute stroke. -LP performed, meningitis panel was negative. -UDS was positive for cocaine. -EEG-persistently showed no epileptiform activity -Per neurology high suspicion for anoxic brain injury versus toxin induced encephalopathy and ischemic strokes. -Neurology recommend continue with thiamine, no immediate indication for addition of antiplatelets for secondary stroke prevention as patient is on therapeutic Lovenox.  When patient is off of anticoagulation, aspirin should be started.   Multifocal cerebral infarcts: -Neurology recommended to start aspirin once patient is off of anticoagulation -Currently on Lovenox for treatment of vein thrombosis.  Acute hypoxic respiratory failure secondary to aspiration pneumonia Right-sided aspiration pneumonia with parapneumonic effusion: -Status post chest tube placement with intrapleural lytics on 9/14, chest tube removed 9/16. -Completed 7 days of IV Zosyn -Intubated on admission outside hospital 9/6. -Extubated 9/17. -Currently on Room air.    Superficial vein thrombosis Diagnosed 6/31 with right basilic vein thrombus -Due to increase for progression of thrombosis patient was started on Lovenox after previous hospitalist discussed with oncology Dr. Alen Blew.  Dysphagia/nutrition: Patient is being followed by SLP, they have now advanced him to dysphagia 3 diet.   NG dislodged late 11/11/21, diet advancing appropriately, will leave out NG tube and follow p.o. intake/calorie count, strict I's and O's.   Thrombocytosis: We need to add aspirin once off of anticoagulation. Monitor.    Mild liver dysfunction/abnormal LFTs History of hepatitis C, improved.   Diarrhea, resolved:     DVT prophylaxis:   Lovenox   Code Status: Full  Code  Family Communication: Daughter [name] updated at length over the phone,  discussed disposition planning now that NG tube has been removed safely and tolerating p.o.  PT OT to reevaluate, she continues to request possible disposition to CIR, he has not qualified previously.  Pending these recommendations will likely transition patient to CIR versus SNF pending bed availability and insurance approval.  Status is: Inpatient Remains inpatient appropriate because: Needs placement.   Estimated body mass index is 22.33 kg/m as calculated from the following:   Height as of this encounter: 5\' 10"  (1.778 m).   Weight as of this encounter: 70.6 kg.    Nutritional Assessment: Body mass index is 22.33 kg/m. Seen by dietician.  I agree with the assessment and plan as outlined below: Nutrition Status: Nutrition Problem: Severe Malnutrition Etiology: social / environmental circumstances (drug abuse) Signs/Symptoms: severe fat depletion, severe muscle depletion Interventions: Tube feeding  . Skin Assessment: I have examined the patient's skin and I agree with the wound assessment as performed by the wound care RN as outlined below:    Consultants:  Neurology Palliative care  Procedures:  As above  Antimicrobials:  Anti-infectives (From admission, onward)    Start     Dose/Rate Route Frequency Ordered Stop   10/23/21 1000  cefTRIAXone (ROCEPHIN) 2 g in sodium chloride 0.9 % 100 mL IVPB  Status:  Discontinued        2 g 200 mL/hr over 30 Minutes Intravenous Every 24 hours 10/22/21 0036 10/22/21 0037   10/23/21 1000  cefTRIAXone (ROCEPHIN) 2 g in sodium chloride 0.9 % 100 mL IVPB  Status:  Discontinued        2 g 200 mL/hr over 30 Minutes Intravenous Every 24 hours 10/22/21 0037 10/22/21 0827   10/22/21 0930  piperacillin-tazobactam (ZOSYN) IVPB 3.375 g        3.375 g 12.5 mL/hr over 240 Minutes Intravenous Every 8 hours 10/22/21 0833 10/29/21 0559   10/22/21 0915  ceFEPIme (MAXIPIME) 2 g in sodium chloride 0.9 % 100 mL IVPB  Status:  Discontinued        2  g 200 mL/hr over 30 Minutes Intravenous Every 8 hours 10/22/21 0827 10/22/21 0833         Subjective:  Seen and examined. No complaints. Alert but not oriented at his baseline. Objective: Vitals:   11/11/21 2339 11/12/21 0345 11/12/21 0500 11/12/21 0725  BP: (!) 126/109 (!) 122/94  (!) 118/93  Pulse: 85 93  100  Resp: 17 18  17   Temp: 97.8 F (36.6 C) 97.7 F (36.5 C)  97.8 F (36.6 C)  TempSrc: Axillary Axillary  Axillary  SpO2: 99% 98%  98%  Weight:   70.6 kg   Height:        Intake/Output Summary (Last 24 hours) at 11/12/2021 0754 Last data filed at 11/12/2021 0500 Gross per 24 hour  Intake 70 ml  Output 1130 ml  Net -1060 ml    Filed Weights   11/07/21 0500 11/08/21 0500 11/12/21 0500  Weight: 69.5 kg 70.8 kg 70.6 kg    Examination:  General exam: Appears calm and comfortable  Respiratory system: Clear to auscultation. Respiratory effort normal. Cardiovascular system: S1 & S2 heard, RRR. No JVD, murmurs, rubs, gallops or clicks. No pedal edema. Gastrointestinal system: Abdomen is nondistended, soft and nontender. No organomegaly or masses felt. Normal bowel sounds heard. Central nervous system: Alert but not oriented.  No focal deficit. Extremities: Symmetric 5 x 5 power. Skin: No rashes,  lesions or ulcers.   Data Reviewed: I have personally reviewed following labs and imaging studies  CBC: Recent Labs  Lab 11/06/21 0451  WBC 10.1  NEUTROABS 6.4  HGB 13.5  HCT 40.0  MCV 94.6  PLT 414*    Basic Metabolic Panel: Recent Labs  Lab 11/06/21 0451  MG 2.3  PHOS 4.1    GFR: Estimated Creatinine Clearance: 101.7 mL/min (by C-G formula based on SCr of 0.79 mg/dL). Liver Function Tests: No results for input(s): "AST", "ALT", "ALKPHOS", "BILITOT", "PROT", "ALBUMIN" in the last 168 hours.  No results for input(s): "LIPASE", "AMYLASE" in the last 168 hours. Recent Labs  Lab 11/08/21 0730 11/09/21 0256 11/10/21 0438 11/11/21 0527 11/12/21 0505   AMMONIA 45* 17 39* <10 84*    Coagulation Profile: No results for input(s): "INR", "PROTIME" in the last 168 hours. Cardiac Enzymes: No results for input(s): "CKTOTAL", "CKMB", "CKMBINDEX", "TROPONINI" in the last 168 hours.  BNP (last 3 results) No results for input(s): "PROBNP" in the last 8760 hours. HbA1C: No results for input(s): "HGBA1C" in the last 72 hours. CBG: Recent Labs  Lab 11/11/21 1609 11/11/21 2013 11/12/21 0028 11/12/21 0502 11/12/21 0731  GLUCAP 108* 98 101* 105* 114*    Lipid Profile: No results for input(s): "CHOL", "HDL", "LDLCALC", "TRIG", "CHOLHDL", "LDLDIRECT" in the last 72 hours. Thyroid Function Tests: No results for input(s): "TSH", "T4TOTAL", "FREET4", "T3FREE", "THYROIDAB" in the last 72 hours. Anemia Panel: No results for input(s): "VITAMINB12", "FOLATE", "FERRITIN", "TIBC", "IRON", "RETICCTPCT" in the last 72 hours. Sepsis Labs: No results for input(s): "PROCALCITON", "LATICACIDVEN" in the last 168 hours.  No results found for this or any previous visit (from the past 240 hour(s)).    Radiology Studies: No results found.  Scheduled Meds:  amLODipine  5 mg Oral Daily   enoxaparin (LOVENOX) injection  70 mg Subcutaneous Q12H   feeding supplement  237 mL Oral TID BM   feeding supplement (OSMOLITE 1.5 CAL)  1,000 mL Per Tube Q24H   feeding supplement (PROSource TF20)  60 mL Per Tube Daily   fiber supplement (BANATROL TF)  60 mL Oral BID   free water  150 mL Per Tube Q4H   lidocaine  1 patch Transdermal Q24H   melatonin  5 mg Oral QHS   mouth rinse  15 mL Mouth Rinse 4 times per day   pantoprazole  40 mg Oral Daily   thiamine  100 mg Oral Daily   Continuous Infusions:     LOS: 22 days   Azucena Fallen, DO [facility]  Available 7a-7p - please see Amion for cross coverage  11/12/2021, 7:54 AM

## 2021-11-12 NOTE — Progress Notes (Signed)
Physical Therapy Treatment Patient Details Name: [name] MRN: [id number] DOB: [birth year] Today's Date: 11/12/2021   History of Present Illness Pt is 56 yo male admitted on to [facility] in [place] on 9/6 after being found unresponsive.  Pt transferred to [facility] on 10/21/21.  Pt with acute toxic metabolic encephalopathy vs encephalitis in pt with acute ischemic strokes per neurology.  9/11 R UE DVT started on lovenox, 9/14-9/16 chest tube, ETT 9/6-9/18.  Hx of polysubstance abuse, hep C, and GERD.    PT Comments    Pt with impulsivity and unpredictability throughout session, pt trying to stand without PT and OT support then in the next moment struggling to initiate stand. Pt making tangential and seemingly nonsensical statements, such as "I miss my hand-me-down girl", and also states "no" to several cues for mobility but then initiates with assist. Pt overall requiring mod+2 assist for short-distance gait in room, pt very unsafe during mobility given impulsivity and weakness.   Pt is a good candidate for SNF given need for 24/7 assistance, impaired cognition, current slow progress with therapies, impaired activity tolerance, and anticipated longer length of stay that will be needed to make functional gains. This PT believes that AIR would be too intensive and too short-term to make needed gains, pt also will likely need to transition to long-term care after rehab given current cognition. PT to continue to follow acutely.   Recommendations for follow up therapy are one component of a multi-disciplinary discharge planning process, led by the attending physician.  Recommendations may be updated based on patient status, additional functional criteria and insurance authorization.  Follow Up Recommendations  Skilled nursing-short term rehab (<3 hours/day) Can patient physically be transported by private vehicle: No   Assistance Recommended at Discharge Frequent or constant Supervision/Assistance   Patient can return home with the following Two people to help with walking and/or transfers;Two people to help with bathing/dressing/bathroom;Assistance with cooking/housework;Assistance with feeding;Help with stairs or ramp for entrance   Equipment Recommendations  Other (comment) (TBD)    Recommendations for Other Services       Precautions / Restrictions Precautions Precautions: Fall Precaution Comments: restraint belt Restrictions Weight Bearing Restrictions: No     Mobility  Bed Mobility Overal bed mobility: Needs Assistance Bed Mobility: Supine to Sit, Sit to Supine, Rolling Rolling: Min guard (only rolled to right with [place], would not follow commands to roll left)   Supine to sit: Min guard Sit to supine: Min guard   General bed mobility comments: Pt up to EOB and down to lay 5+ times with [place], without need for physical A (but not on command) all spontaneously    Transfers Overall transfer level: Needs assistance Equipment used: 2 person hand held assist Transfers: Sit to/from Stand Sit to Stand: Min assist, +2 physical assistance           General transfer comment: Required Mod A +2 once for initiation of movement from sitting EOB to standing, the other 3 time he stood it was min A +2 to initiate or did it spontaneously (x1) without regard for safety.    Ambulation/Gait Ambulation/Gait assistance: Mod assist, +2 physical assistance, +2 safety/equipment Gait Distance (Feet): 10 Feet (to and from chair in front of sink) Assistive device: 2 person hand held assist Gait Pattern/deviations: Step-through pattern, Decreased stride length, Staggering left, Staggering right, Scissoring Gait velocity: decr     General Gait Details: assist to steady, guide pt trajectory   Stairs  Wheelchair Mobility    Modified Rankin (Stroke Patients Only) Modified Rankin (Stroke Patients Only) Pre-Morbid Rankin Score: No symptoms Modified Rankin: Moderately  severe disability     Balance Overall balance assessment: Needs assistance Sitting-balance support: No upper extremity supported, Feet supported Sitting balance-Leahy Scale: Fair     Standing balance support: Bilateral upper extremity supported Standing balance-Leahy Scale: Poor                              Cognition Arousal/Alertness: Awake/alert Behavior During Therapy: Impulsive, Restless Overall Cognitive Status: Impaired/Different from baseline Area of Impairment: Orientation, Attention, Following commands, Safety/judgement, Awareness, Problem solving                 Orientation Level: Disoriented to, Place, Time (said don't know to place, said 2000 for year, no response to month) Current Attention Level: Focused   Following Commands: Follows one step commands inconsistently, Follows one step commands with increased time Safety/Judgement: Decreased awareness of safety, Decreased awareness of deficits Awareness: Intellectual Problem Solving: Slow processing, Decreased initiation, Difficulty sequencing, Requires verbal cues, Requires tactile cues General Comments: Pt followed command to wash face once washcloth placed in his hand, followed command to give me the washcloth back once I reached for it. Did not follow command for brushing teeth or putting on socks. Rubbed lotion on left foot that was itching once lotion placed on foot and had to be cued (verbal and gestural) to rub in lotion on right foot. When asked what he wanted to do after he kept sitting up and laying down in bed he could not answer, gave him choices and he said "yes, I need to do that" to brushing teeth, but then would not do it once we got to sink even with cues.        Exercises      General Comments        Pertinent Vitals/Pain Pain Assessment Pain Assessment: Faces Faces Pain Scale: No hurt    Home Living                          Prior Function            PT  Goals (current goals can now be found in the care plan section) Acute Rehab PT Goals PT Goal Formulation: With family Time For Goal Achievement: 11/24/21 Potential to Achieve Goals: Fair Progress towards PT goals: Progressing toward goals    Frequency    Min 2X/week      PT Plan Current plan remains appropriate    Co-evaluation PT/OT/SLP Co-Evaluation/Treatment: Yes Reason for Co-Treatment: Necessary to address cognition/behavior during functional activity;For patient/therapist safety;To address functional/ADL transfers PT goals addressed during session: Mobility/safety with mobility;Balance;Strengthening/ROM OT goals addressed during session: Strengthening/ROM;ADL's and self-care      AM-PAC PT "6 Clicks" Mobility   Outcome Measure  Help needed turning from your back to your side while in a flat bed without using bedrails?: A Lot Help needed moving from lying on your back to sitting on the side of a flat bed without using bedrails?: A Lot Help needed moving to and from a bed to a chair (including a wheelchair)?: A Lot Help needed standing up from a chair using your arms (e.g., wheelchair or bedside chair)?: A Lot Help needed to walk in hospital room?: A Lot Help needed climbing 3-5 steps with a railing? : Total 6 Click Score: 11  End of Session   Activity Tolerance: Patient limited by fatigue;Patient tolerated treatment well Patient left: in bed;with call bell/phone within reach;with nursing/sitter in room (has a sitter at bedside) Nurse Communication: Mobility status PT Visit Diagnosis: Other abnormalities of gait and mobility (R26.89);Muscle weakness (generalized) (M62.81)     Time: 0076-2263 PT Time Calculation (min) (ACUTE ONLY): 30 min  Charges:  $Therapeutic Activity: 8-22 mins                    Marye Round, PT DPT Acute Rehabilitation Services Pager [phone]  Office [phone]   Tyrone Apple E Christain [place] 11/12/2021, 4:57 PM

## 2021-11-12 NOTE — Plan of Care (Signed)

## 2021-11-13 DIAGNOSIS — G931 Anoxic brain damage, not elsewhere classified: Secondary | ICD-10-CM | POA: Diagnosis not present

## 2021-11-13 DIAGNOSIS — J9601 Acute respiratory failure with hypoxia: Secondary | ICD-10-CM | POA: Diagnosis not present

## 2021-11-13 DIAGNOSIS — E43 Unspecified severe protein-calorie malnutrition: Secondary | ICD-10-CM | POA: Diagnosis not present

## 2021-11-13 DIAGNOSIS — G9340 Encephalopathy, unspecified: Secondary | ICD-10-CM | POA: Diagnosis not present

## 2021-11-13 LAB — BASIC METABOLIC PANEL
Anion gap: 10 (ref 5–15)
BUN: 19 mg/dL (ref 6–20)
CO2: 26 mmol/L (ref 22–32)
Calcium: 10 mg/dL (ref 8.9–10.3)
Chloride: 101 mmol/L (ref 98–111)
Creatinine, Ser: 0.74 mg/dL (ref 0.61–1.24)
GFR, Estimated: >60 mL/min (ref >60)
Glucose, Bld: 102 mg/dL — ABNORMAL HIGH (ref 70–99)
Potassium: 3.7 mmol/L (ref 3.5–5.1)
Sodium: 137 mmol/L (ref 135–145)

## 2021-11-13 LAB — CBC
HCT: 40.7 % (ref 39.0–52.0)
Hemoglobin: 13.8 g/dL (ref 13.0–17.0)
MCH: 31.6 pg (ref 26.0–34.0)
MCHC: 33.9 g/dL (ref 30.0–36.0)
MCV: 93.1 fL (ref 80.0–100.0)
Platelets: 237 10*3/uL (ref 150–400)
RBC: 4.37 MIL/uL (ref 4.22–5.81)
RDW: 12.9 % (ref 11.5–15.5)
WBC: 8.3 10*3/uL (ref 4.0–10.5)
nRBC: 0 % (ref 0.0–0.2)

## 2021-11-13 LAB — AMMONIA: Ammonia: 18 umol/L (ref 9–35)

## 2021-11-13 MED ORDER — LORAZEPAM 2 MG/ML IJ SOLN
0.5000 mg | Freq: Once | INTRAMUSCULAR | Status: AC
  Administered 2021-11-13: 0.5 mg via INTRAVENOUS
  Filled 2021-11-13: qty 1

## 2021-11-13 NOTE — Plan of Care (Signed)

## 2021-11-13 NOTE — Progress Notes (Signed)
Palliative Medicine Inpatient Follow Up Note HPI: 56 year old WM PMHx Polysubstance abuse, Hepatitis C, and GERD.   Presented to [facility] in [place] on 9/6 after being found down.  He was last known well at approximately 13:00 hours that day however EMS was called for wellness check at some time later on.  He was found to be down and unresponsive.  On EMS evaluation the patient was apneic with thready pulse.  He did not responded to intranasal Narcan.  He was bagged in route to the emergency department and subsequently intubated on arrival to the ED.  CT head demonstrated cerebral edema without loss of white matter differentiation.  He was transferred to [facility] for MRI.  On 9/11 he was noted to have upper extremity edema on the right.  Ultrasound demonstrated acute thrombosis in the mid and distal basilic vein.  Hospital course also complicated by aspiration pneumonia.  Subsequently transferred to [facility] on 9/13.     Palliative care was asked to get involved to further address goals of care in the setting of an acute stroke.  Today's Discussion 11/13/2021  *Please note that this is a verbal dictation therefore any spelling or grammatical errors are due to the "[employer]" system interpretation.  Chart reviewed inclusive of vital signs, progress notes, laboratory results, and diagnostic images.  Patient assessed at the bedside today.  He tells me he is feeling "much better" today.  No family present during my visit.  Called patient's daughter [name] for ongoing goals of care conversations and palliative support.  Discussed that despite our conversation on 10/3 [name] was agreeable to PEG at that time) patient still continues to have difficulty with his appetite and nutrition.  Provided update that RD has weighed in again today recommending this and [name] remains on board if this is indicated.  She does not want to be having a conversation about ongoing  nutritional concerns after discharge, but rather feels it is in patient's best interest that he is set up for success beforehand.  Her goal remains to help patient return to living independently and feeding himself if possible.  She understands this may not be where he ultimately ends up and she is relying heavily on the medical team for thorough conversations along the way to his new baseline.  She would still like to discuss his mobility specifically with PT when they are able.  Emotional support and therapeutic listening was provided.  The Palliative team will continue supporting [name] during this time.   Objective Assessment: Vital Signs Vitals:   11/13/21 0750 11/13/21 1204  BP: 125/89 136/79  Pulse: 97 86  Resp: 18 20  Temp: 98.2 F (36.8 C) 98.4 F (36.9 C)  SpO2: 100% 100%    Intake/Output Summary (Last 24 hours) at 11/13/2021 1321 Last data filed at 11/13/2021 0510 Gross per 24 hour  Intake 400 ml  Output 730 ml  Net -330 ml   Gen: Older Caucasian male, disheveled HEENT: Cortrack in place CV: Regular rate and regular rhythm  PULM: On room air breathing is even and nonlabored ABD: soft/nontender  EXT: No edema  Neuro: Awakens though does not follow commands  SUMMARY OF RECOMMENDATIONS   -Continue full code/Full scope of care   -Patient's daughter consents to a PEG tube given ongoing poor nutrition -Patient's daughter remains very interested in discharge to acute inpatient rehab when stable, sent secure chat to PT requesting a conversation with patient's daughter -Psychosocial emotional support provided -PMT  will continue to follow and support incrementally   Billing based on MDM: High ______________________________________________________________________________________ [name] [facility] Palliative Medicine Team Team Cell Phone: [phone] Please utilize secure chat with additional questions, if there is no response within 30 minutes please call the  above phone number   Palliative Medicine Team providers are available by phone from 7am to 7pm daily and can be reached through the team cell phone.  Should this patient require assistance outside of these hours, please call the patient's attending physician.

## 2021-11-13 NOTE — Progress Notes (Signed)
PROGRESS NOTE    [name]  NWG:[id number] DOB: 07-Dec-1965 DOA: 10/21/2021 PCP: [name], MD   Brief Narrative:  56 year old WM PMHx Polysubstance abuse, Hepatitis C,    Presented to [facility] in [place] on 9/6 after being found down.  He was last known well at approximately 13:00 hours that day however EMS was called for wellness check at some time later on.  He was found to be down and unresponsive.  On EMS evaluation the patient was apneic with thready pulse.  He did not responded to intranasal Narcan.  He was bagged in route to the emergency department and subsequently intubated on arrival to the ED. CT head demonstrated cerebral edema without loss of white matter differentiation. He was transferred to [facility] for MRI. On 9/11 he was noted to have upper extremity edema on the right. Ultrasound demonstrated acute thrombosis in the mid and distal basilic vein. Hospital course also complicated by aspiration pneumonia. Subsequently transferred to [facility] on 9/13.  Events:  9/6 found down > present to [facility] ED. CT with bilateral cerebellar edema. 9/7 repeat CT normal Unable to be weaned from sedation 9/11 RUE DVT started on tx dose lovenox 9/13 transfer to [facility] for MRI 9/14 chest tube placed with intrapleural fibrinolytic administration 9/16 chest tube removed 9/17 patient extubated, remained agitated and started on precedex 9/19 Cortrak placed  10/4 Attempting to wean cortrak (increase PO intake more liberally over the next 24h per SLP recs) - once cortrak removed can start having reasonable discussion on disposition.  10/5 Cortrak accidentally removed - advancing diet as tolerated 10/6 - overnight agitation - haldol x1 given - continues to complain of "having a panic attack" but then describes reflux symptoms.  Assessment & Plan:   Principal Problem:   Acute encephalopathy Active Problems:   Fever   Pleural effusion   Deep venous thrombosis (HCC)    Acute respiratory failure with hypoxia (HCC)   Cerebrovascular accident (CVA) ([place])   Protein-calorie malnutrition, severe   Anoxic brain injury ([place])  Acute toxic metabolic encephalopathy in the setting of ischemic stroke: -On high-dose thiamine -In the setting of acute stroke. -LP performed, meningitis panel was negative. -UDS was positive for cocaine. -EEG-persistently showed no epileptiform activity -Per neurology high suspicion for anoxic brain injury versus toxin induced encephalopathy and ischemic strokes. -Neurology recommend continue with thiamine, no immediate indication for addition of antiplatelets for secondary stroke prevention as patient is on therapeutic Lovenox.  When patient is off of anticoagulation, aspirin should be started.   Multifocal cerebral infarcts: -Neurology recommended to start aspirin once patient is off of anticoagulation -Currently on Lovenox for treatment of vein thrombosis.  Acute hypoxic respiratory failure secondary to aspiration pneumonia Right-sided aspiration pneumonia with parapneumonic effusion: -Status post chest tube placement with intrapleural lytics on 9/14, chest tube removed 9/16. -Completed 7 days of IV Zosyn -Intubated on admission outside hospital 9/6. -Extubated 9/17. -Currently on Room air.    Superficial vein thrombosis Diagnosed 5/78 with right basilic vein thrombus -Due to increase for progression of thrombosis patient was started on Lovenox after previous hospitalist discussed with oncology Dr. Alen Blew.  Dysphagia/nutrition: Patient is being followed by SLP, they have now advanced him to dysphagia 3 diet.   NG dislodged late 11/11/21, diet advancing appropriately, will leave out NG tube and follow p.o. intake/calorie count, strict I's and O's.   Thrombocytosis: We need to add aspirin once off of anticoagulation. Monitor.    Mild liver dysfunction/abnormal LFTs History  of hepatitis C, improved.   Diarrhea, resolved:      DVT prophylaxis:   Lovenox   Code Status: Full Code  Family Communication: Daughter [name] updated at length over the phone, discussed disposition planning now that NG tube has been removed safely and tolerating p.o.  PT OT to reevaluate, she continues to request possible disposition to CIR, he has not qualified previously.  Pending these recommendations will likely transition patient to CIR versus SNF pending bed availability and insurance approval.  Status is: Inpatient Remains inpatient appropriate because: Needs placement.   Estimated body mass index is 22.52 kg/m as calculated from the following:   Height as of this encounter: 5\' 10"  (1.778 m).   Weight as of this encounter: 71.2 kg.    Nutritional Assessment: Body mass index is 22.52 kg/m. Seen by dietician.  I agree with the assessment and plan as outlined below: Nutrition Status: Nutrition Problem: Severe Malnutrition Etiology: social / environmental circumstances (drug abuse) Signs/Symptoms: severe fat depletion, severe muscle depletion Interventions: Tube feeding  . Skin Assessment: I have examined the patient's skin and I agree with the wound assessment as performed by the wound care RN as outlined below:    Consultants:  Neurology Palliative care  Procedures:  As above  Antimicrobials:  Anti-infectives (From admission, onward)    Start     Dose/Rate Route Frequency Ordered Stop   10/23/21 1000  cefTRIAXone (ROCEPHIN) 2 g in sodium chloride 0.9 % 100 mL IVPB  Status:  Discontinued        2 g 200 mL/hr over 30 Minutes Intravenous Every 24 hours 10/22/21 0036 10/22/21 0037   10/23/21 1000  cefTRIAXone (ROCEPHIN) 2 g in sodium chloride 0.9 % 100 mL IVPB  Status:  Discontinued        2 g 200 mL/hr over 30 Minutes Intravenous Every 24 hours 10/22/21 0037 10/22/21 0827   10/22/21 0930  piperacillin-tazobactam (ZOSYN) IVPB 3.375 g        3.375 g 12.5 mL/hr over 240 Minutes Intravenous Every 8 hours 10/22/21  0833 10/29/21 0559   10/22/21 0915  ceFEPIme (MAXIPIME) 2 g in sodium chloride 0.9 % 100 mL IVPB  Status:  Discontinued        2 g 200 mL/hr over 30 Minutes Intravenous Every 8 hours 10/22/21 0827 10/22/21 0833         Subjective:  Seen and examined. No complaints. Alert but not oriented at his baseline. Objective: Vitals:   11/12/21 1920 11/12/21 2357 11/13/21 0443 11/13/21 0500  BP: 136/85 118/80 (!) 121/105   Pulse: 91 81 100   Resp: 18 17 18    Temp: 97.7 F (36.5 C) 97.9 F (36.6 C) 97.7 F (36.5 C)   TempSrc: Axillary Axillary Axillary   SpO2: 99% 99% 100%   Weight:    71.2 kg  Height:        Intake/Output Summary (Last 24 hours) at 11/13/2021 0734 Last data filed at 11/13/2021 0510 Gross per 24 hour  Intake 600 ml  Output 730 ml  Net -130 ml    Filed Weights   11/08/21 0500 11/12/21 0500 11/13/21 0500  Weight: 70.8 kg 70.6 kg 71.2 kg    Examination:  General exam: Appears calm and comfortable  Respiratory system: Clear to auscultation. Respiratory effort normal. Cardiovascular system: S1 & S2 heard, RRR. No JVD, murmurs, rubs, gallops or clicks. No pedal edema. Gastrointestinal system: Abdomen is nondistended, soft and nontender. No organomegaly or masses felt. Normal bowel sounds heard.  Central nervous system: Alert but not oriented.  No focal deficit. Extremities: Symmetric 5 x 5 power. Skin: No rashes, lesions or ulcers.   Data Reviewed: I have personally reviewed following labs and imaging studies  CBC: Recent Labs  Lab 11/13/21 0439  WBC 8.3  HGB 13.8  HCT 40.7  MCV 93.1  PLT 237    Basic Metabolic Panel: Recent Labs  Lab 11/13/21 0439  NA 137  K 3.7  CL 101  CO2 26  GLUCOSE 102*  BUN 19  CREATININE 0.74  CALCIUM 10.0    GFR: Estimated Creatinine Clearance: 102.6 mL/min (by C-G formula based on SCr of 0.74 mg/dL). Liver Function Tests: No results for input(s): "AST", "ALT", "ALKPHOS", "BILITOT", "PROT", "ALBUMIN" in the last  168 hours.  No results for input(s): "LIPASE", "AMYLASE" in the last 168 hours. Recent Labs  Lab 11/09/21 0256 11/10/21 0438 11/11/21 0527 11/12/21 0505 11/13/21 0439  AMMONIA 17 39* <10 84* 18    Coagulation Profile: No results for input(s): "INR", "PROTIME" in the last 168 hours. Cardiac Enzymes: No results for input(s): "CKTOTAL", "CKMB", "CKMBINDEX", "TROPONINI" in the last 168 hours.  BNP (last 3 results) No results for input(s): "PROBNP" in the last 8760 hours. HbA1C: No results for input(s): "HGBA1C" in the last 72 hours. CBG: Recent Labs  Lab 11/11/21 1609 11/11/21 2013 11/12/21 0028 11/12/21 0502 11/12/21 0731  GLUCAP 108* 98 101* 105* 114*    Lipid Profile: No results for input(s): "CHOL", "HDL", "LDLCALC", "TRIG", "CHOLHDL", "LDLDIRECT" in the last 72 hours. Thyroid Function Tests: No results for input(s): "TSH", "T4TOTAL", "FREET4", "T3FREE", "THYROIDAB" in the last 72 hours. Anemia Panel: No results for input(s): "VITAMINB12", "FOLATE", "FERRITIN", "TIBC", "IRON", "RETICCTPCT" in the last 72 hours. Sepsis Labs: No results for input(s): "PROCALCITON", "LATICACIDVEN" in the last 168 hours.  No results found for this or any previous visit (from the past 240 hour(s)).    Radiology Studies: No results found.  Scheduled Meds:  amLODipine  5 mg Oral Daily   enoxaparin (LOVENOX) injection  70 mg Subcutaneous Q12H   feeding supplement  237 mL Oral TID BM   lidocaine  1 patch Transdermal Q24H   melatonin  5 mg Oral QHS   mouth rinse  15 mL Mouth Rinse BID   pantoprazole  40 mg Oral Daily   thiamine  100 mg Oral Daily   Continuous Infusions:     LOS: 23 days   Azucena Fallen, DO [facility]  Available 7a-7p - please see Amion for cross coverage  11/13/2021, 7:34 AM

## 2021-11-13 NOTE — Progress Notes (Addendum)
Nutrition Follow-up  DOCUMENTATION CODES:  Severe malnutrition in context of social or environmental circumstances  INTERVENTION:  Continue current diet as recommend per SLP Feeding assistance Ensure Enlive po TID, each supplement provides 350 kcal and 20 grams of protein. Mighty shakes TID to provide 330kcal and 9g of protein Appetite remains poor, would recommend placement of PEG for a long-term means of nutrition  NUTRITION DIAGNOSIS:  Severe Malnutrition related to social / environmental circumstances (drug abuse) as evidenced by severe fat depletion, severe muscle depletion. - remains applicable  GOAL:  Patient will meet greater than or equal to 90% of their needs - progressing  MONITOR:  TF tolerance, Diet advancement, Labs  REASON FOR ASSESSMENT:  Consult Assessment of nutrition requirement/status (Patient with Diarrhea, should we change formula?)  ASSESSMENT:   56 yo male admitted to [facility] after being found down. Transferred to [facility] with concerns for anoxic brain injury (CT showed bilateral cerebellar edema). PMH includes polysubstance abuse, hepatitis C, GERD, smoker.  9/13 - admitted from outside facility intubated 9/14 - TF initiated 9/18 - extubated, SLP eval, recommended NPO 9/19 - cortrak placed 9/27 - MBS, advanced to DYS3/Thins, IR evaluated and found anatomy is amenable to g-tube if needed 9/29 - TF were adjusted to nocturnal 10/5 - Pt pulled cortrak  Pt resting in bed, awake but unable to provide a meaningful history. Sitter present states that pt drank an ensure this AM and is currently working on his second one. Refused his breakfast tray. Unsure if pt will be able to meet his nutrition needs without enteral feeds. Noted about ~12% weight loss since admission over the last month which is severe.   Reached out to MD and RN about concerns with pt's inability to meet needs orally.  Admit weight: 80.9 kg Current Weight: 71.2 kg  Average Meal  Intake: 9/27-9/29: 23% average intake x 4 recorded meals 9/30-10/3: 20% intake x 8 recorded meals 10/4-10/5: 29% intake x 5 recorded meals   Intake/Output Summary (Last 24 hours) at 11/13/2021 0902 Last data filed at 11/13/2021 0510 Gross per 24 hour  Intake 600 ml  Output 730 ml  Net -130 ml  Net IO Since Admission: -12,590.06 mL [11/13/21 0902]  Nutritionally Relevant Medications: Scheduled Meds:  Ensure Enlive  237 mL Oral TID BM   pantoprazole  40 mg Oral Daily   thiamine  100 mg Oral Daily   PRN Meds: calcium carbonate, diphenhydrAMINE, docusate sodium, ondansetron, polyethylene glycol  Labs Reviewed: CBG ranges from 112-161mg /dL over the last 24 hours  NUTRITION - FOCUSED PHYSICAL EXAM: Flowsheet Row Most Recent Value  Orbital Region Mild depletion  Upper Arm Region Severe depletion  Thoracic and Lumbar Region Severe depletion  Buccal Region Mild depletion  Temple Region Mild depletion  Clavicle Bone Region Severe depletion  Clavicle and Acromion Bone Region Severe depletion  Scapular Bone Region Mild depletion  Dorsal Hand Severe depletion  Patellar Region Severe depletion  Anterior Thigh Region Severe depletion  Posterior Calf Region Severe depletion  Edema (RD Assessment) None  Hair Reviewed  [tangled, unkept]  Eyes Reviewed  Mouth Reviewed  Skin Reviewed  Nails Reviewed   Diet Order:   Diet Order             Diet regular Room service appropriate? Yes; Fluid consistency: Thin  Diet effective now                   EDUCATION NEEDS:  Not appropriate for education at this time  Skin:  Skin Assessment: Reviewed RN Assessment  Last BM:  10/4  Height:  Ht Readings from Last 1 Encounters:  10/21/21 5\' 10"  (1.778 m)    Weight:  Wt Readings from Last 1 Encounters:  11/13/21 71.2 kg    Ideal Body Weight:  75.5 kg  BMI:  Body mass index is 22.52 kg/m.  Estimated Nutritional Needs:  Kcal:  2200-2400 Protein:  110-120 gm Fluid:  2.2-2.4  L   Ranell Patrick, RD, LDN Clinical Dietitian RD pager # available in [place]  After hours/weekend pager # available in [name]

## 2021-11-14 DIAGNOSIS — G9340 Encephalopathy, unspecified: Secondary | ICD-10-CM | POA: Diagnosis not present

## 2021-11-14 DIAGNOSIS — E43 Unspecified severe protein-calorie malnutrition: Secondary | ICD-10-CM | POA: Diagnosis not present

## 2021-11-14 DIAGNOSIS — G931 Anoxic brain damage, not elsewhere classified: Secondary | ICD-10-CM | POA: Diagnosis not present

## 2021-11-14 DIAGNOSIS — J9601 Acute respiratory failure with hypoxia: Secondary | ICD-10-CM | POA: Diagnosis not present

## 2021-11-14 LAB — AMMONIA: Ammonia: 25 umol/L (ref 9–35)

## 2021-11-14 NOTE — Progress Notes (Signed)
TRH night cross cover note:   I was notified by RN that patient is appearing anxious and requesting his outpatient Valium. I placed order for one time dose of Ativan 0.5 mg IV.     Babs Bertin, DO Hospitalist

## 2021-11-14 NOTE — Progress Notes (Signed)
PROGRESS NOTE    April Carlyon  UUV:[id number] DOB: [birth year] DOA: 10/21/2021 PCP: [name], MD   Brief Narrative:  56 year old WM PMHx Polysubstance abuse, Hepatitis C,    Presented to [facility] in [place] on 9/6 after being found down.  He was last known well at approximately 13:00 hours that day however EMS was called for wellness check at some time later on.  He was found to be down and unresponsive.  On EMS evaluation the patient was apneic with thready pulse.  He did not responded to intranasal Narcan.  He was bagged in route to the emergency department and subsequently intubated on arrival to the ED. CT head demonstrated cerebral edema without loss of white matter differentiation. He was transferred to [facility] for MRI. On 9/11 he was noted to have upper extremity edema on the right. Ultrasound demonstrated acute thrombosis in the mid and distal basilic vein. Hospital course also complicated by aspiration pneumonia. Subsequently transferred to [facility] on 9/13.  Events:  9/6 found down > present to [facility] ED. CT with bilateral cerebellar edema. 9/7 repeat CT normal Unable to be weaned from sedation 9/11 RUE DVT started on tx dose lovenox 9/13 transfer to [facility] for MRI 9/14 chest tube placed with intrapleural fibrinolytic administration 9/16 chest tube removed 9/17 patient extubated, remained agitated and started on precedex 9/19 Cortrak placed  10/4 Attempting to wean cortrak (increase PO intake more liberally over the next 24h per SLP recs) - once cortrak removed can start having reasonable discussion on disposition.  10/5 Cortrak accidentally removed - advancing diet as tolerated 10/6 - overnight agitation - haldol x1 given - continues to complain of "having a panic attack" but then describes reflux symptoms. 10/7-patient continues to have word finding difficulty, received benzos overnight as patient reportedly requested outpatient Valium which she has  not been on and does not currently take.  We will attempt to limit CNS depressing medications given his ongoing mental status changes from previous baseline secondary to below.  Assessment & Plan:   Principal Problem:   Acute encephalopathy Active Problems:   Fever   Pleural effusion   Deep venous thrombosis (HCC)   Acute respiratory failure with hypoxia (HCC)   Cerebrovascular accident (CVA) (HCC)   Protein-calorie malnutrition, severe   Anoxic brain injury (HCC)  Goals of care  -Lengthy discussion with daughter previously by myself and prior physicians.  Family expectations is that patient will continue to recover back to previous baseline which we discussed is unlikely to occur given patient's stable nature at this time difficult to reorient or distract, mood is quite labile, has difficulty word finding and is very impulsive in his behavior. -Prior disposition was somewhat limited by NG tube placement with prior concern for possible PEG tube placement, now that dietary intake is somewhat improving patient may be able to discharge soon once p.o. intake is more appropriate. -Daughter continues to request evaluation for CIR which PT and myself explained were not appropriate given patient's current therapy requirements; however, SNF placement is appropriate. Goal is to transfer to SNF once patient's PO intake is adequate(again his PO intake is limited by his impulsive nature/confusion/difficulty orienting to his surroundings - requires multiple queues to eat. Remains high risk for PEG tube placement but if PO intake remains poor this may be our only option for increase and appropriate nutrition).  Acute toxic metabolic encephalopathy in the setting of ischemic stroke: -On high-dose thiamine -In the setting of acute stroke/toxic encephalopathy  versus possible previous anoxic event as patient was found down in the field of unclear etiology and duration -with cocaine positive UDS -LP performed,  meningitis panel was negative. -EEG-persistently showed no epileptiform activity -Per neurology high suspicion for anoxic brain injury versus toxin induced encephalopathy and ischemic strokes. -Neurology recommend continue with thiamine, no immediate indication for addition of antiplatelets for secondary stroke prevention as patient is on therapeutic Lovenox.  When patient is off of anticoagulation, aspirin should be started. -Continue to limit patient's CNS depressing medications, certainly avoid benzodiazepines given patient's history, consider low-dose antipsychotics if necessary for agitation. -Patient continues to become agitated likely secondary to ongoing confusion and difficulty with orientation, difficulty with word finding, poorly redirectable and his mood is quite labile, somewhat histrionic at times without clear provocation or inciting events.   Multifocal cerebral infarcts: -Neurology recommended to start aspirin once patient is off of anticoagulation -Currently on Lovenox for treatment of vein thrombosis. -High risk for falls, discussed fall risk with family, safe disposition is paramount as below  Acute hypoxic respiratory failure secondary to aspiration pneumonia, resolved Right-sided aspiration pneumonia with parapneumonic effusion: -Status post chest tube placement with intrapleural lytics on 9/14, chest tube removed 9/16. -Completed 7 days of Zosyn -Intubated on admission outside hospital 9/6. -Extubated 9/17. -Currently on Room air.    Superficial vein thrombosis 2/77 Right basilic vein thrombus -Due to increase risk for progression of thrombosis patient was started on Lovenox per previous discussion with oncology (Dr. Alen Blew).  Dysphagia/nutrition: Patient is being followed by SLP, they have now advanced him to dysphagia 3 diet.   NG dislodged late 11/11/21, diet advancing appropriately, will leave out NG tube and follow p.o. intake/calorie count, strict I's and O's.    Thrombocytosis: We need to add aspirin once off of anticoagulation. Monitor.    Mild liver dysfunction/abnormal LFTs History of hepatitis C, improved.   Diarrhea, resolved:     DVT prophylaxis:   Lovenox   Code Status: Full Code  Family Communication: Daughter [name] updated at length over the phone 10/5, discussed disposition planning now that NG tube has been removed safely and tolerating p.o.  PT OT to reevaluate, she continues to request possible disposition to CIR, he has not qualified previously.  At this time pending improved PO intake will plan to transition to SNF pending bed availability and insurance approval.  Status is: Inpatient Remains inpatient appropriate because: Needs placement.   Estimated body mass index is 22.52 kg/m as calculated from the following:   Height as of this encounter: 5\' 10"  (1.778 m).   Weight as of this encounter: 71.2 kg.    Nutritional Assessment: Body mass index is 22.52 kg/m.Marland Kitchen Seen by dietician.  I agree with the assessment and plan as outlined below: Nutrition Status: Nutrition Problem: Severe Malnutrition Etiology: social / environmental circumstances (drug abuse) Signs/Symptoms: severe fat depletion, severe muscle depletion Interventions: Tube feeding  . Skin Assessment: I have examined the patient's skin and I agree with the wound assessment as performed by the wound care RN as outlined below:    Consultants:  Neurology Palliative care  Procedures:  As above  Antimicrobials:  Anti-infectives (From admission, onward)    Start     Dose/Rate Route Frequency Ordered Stop   10/23/21 1000  cefTRIAXone (ROCEPHIN) 2 g in sodium chloride 0.9 % 100 mL IVPB  Status:  Discontinued        2 g 200 mL/hr over 30 Minutes Intravenous Every 24 hours 10/22/21 0036 10/22/21 0037  10/23/21 1000  cefTRIAXone (ROCEPHIN) 2 g in sodium chloride 0.9 % 100 mL IVPB  Status:  Discontinued        2 g 200 mL/hr over 30 Minutes Intravenous Every  24 hours 10/22/21 0037 10/22/21 0827   10/22/21 0930  piperacillin-tazobactam (ZOSYN) IVPB 3.375 g        3.375 g 12.5 mL/hr over 240 Minutes Intravenous Every 8 hours 10/22/21 0833 10/29/21 0559   10/22/21 0915  ceFEPIme (MAXIPIME) 2 g in sodium chloride 0.9 % 100 mL IVPB  Status:  Discontinued        2 g 200 mL/hr over 30 Minutes Intravenous Every 8 hours 10/22/21 0827 10/22/21 0833         Subjective:  Seen and examined. No complaints. Alert but not oriented at his baseline. Objective: Vitals:   11/13/21 2003 11/13/21 2355 11/14/21 0424 11/14/21 0742  BP: (!) 125/91 (!) 130/92 (!) 128/96 121/87  Pulse: 94 96 (!) 108 (!) 104  Resp: 19 17 20 18   Temp: 97.8 F (36.6 C) 97.8 F (36.6 C) 97.7 F (36.5 C) 97.7 F (36.5 C)  TempSrc: Oral   Axillary  SpO2: 100% 99% 98% 97%  Weight:      Height:        Intake/Output Summary (Last 24 hours) at 11/14/2021 0756 Last data filed at 11/14/2021 0300 Gross per 24 hour  Intake 476 ml  Output 400 ml  Net 76 ml    Filed Weights   11/08/21 0500 11/12/21 0500 11/13/21 0500  Weight: 70.8 kg 70.6 kg 71.2 kg    Examination:  General exam: Appears calm and comfortable  Respiratory system: Clear to auscultation. Respiratory effort normal. Cardiovascular system: S1 & S2 heard, RRR. No JVD, murmurs, rubs, gallops or clicks. No pedal edema. Gastrointestinal system: Abdomen is nondistended, soft and nontender. No organomegaly or masses felt. Normal bowel sounds heard. Central nervous system: Alert but not oriented.  No focal deficit. Extremities: Symmetric 5 x 5 power. Skin: No rashes, lesions or ulcers.   Data Reviewed: I have personally reviewed following labs and imaging studies  CBC: Recent Labs  Lab 11/13/21 0439  WBC 8.3  HGB 13.8  HCT 40.7  MCV 93.1  PLT 237    Basic Metabolic Panel: Recent Labs  Lab 11/13/21 0439  NA 137  K 3.7  CL 101  CO2 26  GLUCOSE 102*  BUN 19  CREATININE 0.74  CALCIUM 10.0     GFR: Estimated Creatinine Clearance: 102.6 mL/min (by C-G formula based on SCr of 0.74 mg/dL). Liver Function Tests: No results for input(s): "AST", "ALT", "ALKPHOS", "BILITOT", "PROT", "ALBUMIN" in the last 168 hours.  No results for input(s): "LIPASE", "AMYLASE" in the last 168 hours. Recent Labs  Lab 11/10/21 0438 11/11/21 0527 11/12/21 0505 11/13/21 0439 11/14/21 0340  AMMONIA 39* <10 84* 18 25    Coagulation Profile: No results for input(s): "INR", "PROTIME" in the last 168 hours. Cardiac Enzymes: No results for input(s): "CKTOTAL", "CKMB", "CKMBINDEX", "TROPONINI" in the last 168 hours.  BNP (last 3 results) No results for input(s): "PROBNP" in the last 8760 hours. HbA1C: No results for input(s): "HGBA1C" in the last 72 hours. CBG: Recent Labs  Lab 11/11/21 1609 11/11/21 2013 11/12/21 0028 11/12/21 0502 11/12/21 0731  GLUCAP 108* 98 101* 105* 114*    Lipid Profile: No results for input(s): "CHOL", "HDL", "LDLCALC", "TRIG", "CHOLHDL", "LDLDIRECT" in the last 72 hours. Thyroid Function Tests: No results for input(s): "TSH", "T4TOTAL", "FREET4", "T3FREE", "THYROIDAB"  in the last 72 hours. Anemia Panel: No results for input(s): "VITAMINB12", "FOLATE", "FERRITIN", "TIBC", "IRON", "RETICCTPCT" in the last 72 hours. Sepsis Labs: No results for input(s): "PROCALCITON", "LATICACIDVEN" in the last 168 hours.  No results found for this or any previous visit (from the past 240 hour(s)).    Radiology Studies: No results found.  Scheduled Meds:  amLODipine  5 mg Oral Daily   enoxaparin (LOVENOX) injection  70 mg Subcutaneous Q12H   feeding supplement  237 mL Oral TID BM   lidocaine  1 patch Transdermal Q24H   melatonin  5 mg Oral QHS   mouth rinse  15 mL Mouth Rinse BID   pantoprazole  40 mg Oral Daily   thiamine  100 mg Oral Daily   Continuous Infusions:     LOS: 24 days   Azucena Fallen, DO [facility]  Available 7a-7p - please see  Amion for cross coverage  11/14/2021, 7:56 AM

## 2021-11-14 NOTE — Progress Notes (Signed)
Palliative Medicine Inpatient Follow Up Note HPI: 56 year old WM PMHx Polysubstance abuse, Hepatitis C, and GERD.   Presented to [facility] in [place] on 9/6 after being found down.  He was last known well at approximately 13:00 hours that day however EMS was called for wellness check at some time later on.  He was found to be down and unresponsive.  On EMS evaluation the patient was apneic with thready pulse.  He did not responded to intranasal Narcan.  He was bagged in route to the emergency department and subsequently intubated on arrival to the ED.  CT head demonstrated cerebral edema without loss of white matter differentiation.  He was transferred to [facility] for MRI.  On 9/11 he was noted to have upper extremity edema on the right.  Ultrasound demonstrated acute thrombosis in the mid and distal basilic vein.  Hospital course also complicated by aspiration pneumonia.  Subsequently transferred to [facility] on 9/13.     Palliative care was asked to get involved to further address goals of care in the setting of an acute stroke.  Today's Discussion 11/14/2021  *Please note that this is a verbal dictation therefore any spelling or grammatical errors are due to the "[place] [facility]" system interpretation.  Chart reviewed inclusive of vital signs, progress notes, laboratory results, and diagnostic images.  Discussed with NT, who shares patient has had very little of his breakfast today.  Patient states he is not hungry.  No family present during my visit.  Called patient's daughter [name] for palliative support.  She is happy to have talked to PT yesterday about increasing his frequency of visits.  She is also hopeful to speak with a dietitian after she recovers from her own respiratory illness, as she still has concerns about patient's nutrition.  Explored whether she has further palliative needs.  She feels she has been very supported thus far and is grateful for this.   She is agreeable to PMT signing off and would be happy to call PMT should she need anything else during patient's admission.  Questions and concerns addressed.  Objective Assessment: Vital Signs Vitals:   11/14/21 0742 11/14/21 1112  BP: 121/87 (!) 132/115  Pulse: (!) 104 97  Resp: 18 16  Temp: 97.7 F (36.5 C) 98.5 F (36.9 C)  SpO2: 97% 99%    Intake/Output Summary (Last 24 hours) at 11/14/2021 1137 Last data filed at 11/14/2021 0900 Gross per 24 hour  Intake 593 ml  Output 400 ml  Net 193 ml   Gen: Older Caucasian male CV: Tachycardic PULM: On room air breathing is even and nonlabored ABD: soft/nontender  EXT: No edema  Neuro: Awakens though does not follow commands  SUMMARY OF RECOMMENDATIONS   -Continue full code/Full scope of care   -Patient's daughter hopes to speak with dietician in the near future to discuss nutritional concerns, she is open to PEG placement -Psychosocial and emotional support provided -PMT will sign off after discussion with patient's daughter and primary attending. Please reconsult should needs arise.   Billing based on MDM: High ______________________________________________________________________________________ [place] Team Team Cell Phone: [phone] Please utilize secure chat with additional questions, if there is no response within 30 minutes please call the above phone number   Palliative Medicine Team providers are available by phone from 7am to 7pm daily and can be reached through the team cell phone.  Should this patient require assistance outside of these hours, please call the  patient's attending physician.

## 2021-11-14 NOTE — Progress Notes (Signed)
Occupational Therapy Treatment Patient Details Name: [name] MRN: [id number] DOB: 08-24-1965 Today's Date: 11/14/2021   History of present illness Pt is 56 yo male admitted on to [facility] health in [place] on 9/6 after being found unresponsive.  Pt transferred to [facility] on 10/21/21.  Pt with acute toxic metabolic encephalopathy vs encephalitis in pt with acute ischemic strokes per neurology.  9/11 R UE DVT started on lovenox, 9/14-9/16 chest tube, ETT 9/6-9/18.  Hx of polysubstance abuse, hep C, and GERD.   OT comments  OT treatment session with focus on attention/behavior, command following, sequencing of familiar tasks including donning/doffing footwear, ADL transfers and household mobility in prep for safe d/c to next level of care. Patient requires Min guard to Min A +2 for safety with all ADL transfers. Tolerated short distance mobility and transfer to recliner. Restraint belt donned once seated in chair. Limited by poor cognition including problem solving and memory deficits, expressive difficulties, restlessness/impulsivity, emotional lability, poor safety awareness and impaired strength/balance. Continued recommendation for SNF rehab. Ot will continue to follow acutely.    Recommendations for follow up therapy are one component of a multi-disciplinary discharge planning process, led by the attending physician.  Recommendations may be updated based on patient status, additional functional criteria and insurance authorization.    Follow Up Recommendations  Skilled nursing-short term rehab (<3 hours/day)    Assistance Recommended at Discharge Frequent or constant Supervision/Assistance  Patient can return home with the following  Two people to help with walking and/or transfers;A lot of help with bathing/dressing/bathroom;Assistance with cooking/housework;Assistance with feeding;Help with stairs or ramp for entrance;Assist for transportation;Direct supervision/assist for financial  management;Direct supervision/assist for medications management   Equipment Recommendations  Other (comment) (Defer to next level of care)    Recommendations for Other Services      Precautions / Restrictions Precautions Precautions: Fall Precaution Comments: restraint belt Restrictions Weight Bearing Restrictions: No       Mobility Bed Mobility Overal bed mobility: Needs Assistance Bed Mobility: Supine to Sit Rolling: Supervision         General bed mobility comments: Close supervision A for safety with bed mobility with cues for safety. Spontaneously returns to supine several times before completing sit to stand transfer.    Transfers Overall transfer level: Needs assistance Equipment used: 2 person hand held assist Transfers: Sit to/from Stand Sit to Stand: Min guard, +2 safety/equipment           General transfer comment: Min guard and increased time for sit to stand. +2 assist for safety as patient is very impulsive with poor safety and spatial awareness.     Balance Overall balance assessment: Needs assistance Sitting-balance support: No upper extremity supported, Feet supported Sitting balance-Leahy Scale: Fair Sitting balance - Comments: Close supervision A for safety.   Standing balance support: Bilateral upper extremity supported Standing balance-Leahy Scale: Poor Standing balance comment: UE support for balance in standing                           ADL either performed or assessed with clinical judgement   ADL Overall ADL's : Needs assistance/impaired   Eating/Feeding Details (indicate cue type and reason): Declines participation with self-feeding this date. Grooming: Maximal assistance;Standing Grooming Details (indicate cue type and reason): Does not follow verbal commands to complete oral hygiene. Washes face with hand. Does not take washcloth from this Clinical research associate.             Lower  Body Dressing: Moderate assistance;Sit to/from  stand Lower Body Dressing Details (indicate cue type and reason): Able to doff footwear without external assist and close supervision A for safety. Mod A to don footwear with Max cues for sequencing and attention to task. Toilet Transfer: Minimal assistance;+2 for physical assistance Toilet Transfer Details (indicate cue type and reason): Simulated with transfer to recliner. HHA +1 for safety and Max tactile cues for direction. Sitter on standby for safety.         Functional mobility during ADLs: Minimal assistance;+2 for safety/equipment General ADL Comments: Limited by emotionally labile, poor command following and poor cognition.    Extremity/Trunk Assessment Upper Extremity Assessment Upper Extremity Assessment: Difficult to assess due to impaired cognition            Vision   Additional Comments: Rarely makes eye contact with this Probation officer. Poorly avoids obstacles during mobility in room.   Perception     Praxis      Cognition Arousal/Alertness: Awake/alert Behavior During Therapy: Impulsive, Restless Overall Cognitive Status: Impaired/Different from baseline Area of Impairment: Orientation, Attention, Following commands, Safety/judgement, Awareness, Problem solving                 Orientation Level: Disoriented to, Place, Time (Did not formally assess this date) Current Attention Level: Focused   Following Commands: Follows one step commands inconsistently, Follows one step commands with increased time Safety/Judgement: Decreased awareness of safety, Decreased awareness of deficits Awareness: Intellectual Problem Solving: Slow processing, Decreased initiation, Difficulty sequencing, Requires verbal cues, Requires tactile cues General Comments: Emotionally labile crying several times throughout session. Follows verbal command with 25% accuracy. Poor attention to task. Requires max verbal cues for sequencing and safety with basic ADLs including face washing. Unable  to follow verbal commands to brush teeth this date.        Exercises      Shoulder Instructions       General Comments 1:1 sitter present in room. Patient with continued confusion, nonsensical speech and emotional lability limiting participation with therapy efforts.    Pertinent Vitals/ Pain       Pain Assessment Pain Assessment: Faces Faces Pain Scale: No hurt  Home Living                                          Prior Functioning/Environment              Frequency  Min 2X/week        Progress Toward Goals  OT Goals(current goals can now be found in the care plan section)  Progress towards OT goals: Progressing toward goals  Acute Rehab OT Goals Patient Stated Goal: No goals stated. OT Goal Formulation: Patient unable to participate in goal setting Time For Goal Achievement: 11/26/21 Potential to Achieve Goals: Fair ADL Goals Pt Will Perform Grooming: with mod assist;sitting Pt Will Perform Upper Body Bathing: with mod assist;sitting Pt Will Transfer to Toilet: with max assist;stand pivot transfer;bedside commode Pt Will Perform Toileting - Clothing Manipulation and hygiene: with max assist;sit to/from stand Additional ADL Goal #1: Pt will sit EOB at min guard level for 5 min inorder to build up activity tolerance for ADL participation Additional ADL Goal #2: Pt will perform bed mobility as precursor to ADL at max A.  Plan Discharge plan remains appropriate;Frequency remains appropriate    Co-evaluation  AM-PAC OT "6 Clicks" Daily Activity     Outcome Measure   Help from another person eating meals?: Total Help from another person taking care of personal grooming?: A Lot Help from another person toileting, which includes using toliet, bedpan, or urinal?: A Lot Help from another person bathing (including washing, rinsing, drying)?: Total Help from another person to put on and taking off regular upper body  clothing?: A Lot Help from another person to put on and taking off regular lower body clothing?: A Lot 6 Click Score: 10    End of Session Equipment Utilized During Treatment: Gait belt  OT Visit Diagnosis: Unsteadiness on feet (R26.81);Other abnormalities of gait and mobility (R26.89);Other symptoms and signs involving cognitive function   Activity Tolerance Patient tolerated treatment well   Patient Left in chair;with call bell/phone within reach;with chair alarm set;with nursing/sitter in room (Restraint belt donned in chair)   Nurse Communication          Time: 0762-2633 OT Time Calculation (min): 19 min  Charges: OT General Charges $OT Visit: 1 Visit OT Treatments $Therapeutic Activity: 8-22 mins  Emmalyn Hinson H. OTR/L Supplemental OT, Department of rehab services [phone]  Robby Pirani R H. 11/14/2021, 1:46 PM

## 2021-11-15 DIAGNOSIS — G9340 Encephalopathy, unspecified: Secondary | ICD-10-CM | POA: Diagnosis not present

## 2021-11-15 LAB — AMMONIA: Ammonia: 32 umol/L (ref 9–35)

## 2021-11-15 MED ORDER — HYDROXYZINE HCL 25 MG PO TABS
25.0000 mg | ORAL_TABLET | Freq: Once | ORAL | Status: AC
  Administered 2021-11-15: 25 mg via ORAL
  Filled 2021-11-15: qty 1

## 2021-11-15 MED ORDER — HYDROXYZINE HCL 25 MG PO TABS
25.0000 mg | ORAL_TABLET | Freq: Four times a day (QID) | ORAL | Status: DC | PRN
  Administered 2021-11-15 – 2021-11-28 (×30): 25 mg via ORAL
  Filled 2021-11-15 (×32): qty 1

## 2021-11-15 MED ORDER — LORAZEPAM 2 MG/ML IJ SOLN: 0.5000 mg | Freq: Once | INTRAMUSCULAR | Status: DC

## 2021-11-15 MED ORDER — APIXABAN 5 MG PO TABS
5.0000 mg | ORAL_TABLET | Freq: Two times a day (BID) | ORAL | Status: DC
  Administered 2021-11-15 – 2021-11-30 (×30): 5 mg via ORAL
  Filled 2021-11-15 (×30): qty 1

## 2021-11-15 NOTE — Progress Notes (Signed)
PROGRESS NOTE    [name]  SWF:[id number] DOB: 02/12/1965 DOA: 10/21/2021 PCP: [name], MD   Brief Narrative:  56 year old WM PMHx Polysubstance abuse, Hepatitis C,    Presented to [facility] in [place] on 9/6 after being found down.  He was last known well at approximately 13:00 hours that day however EMS was called for wellness check at some time later on.  He was found to be down and unresponsive.  On EMS evaluation the patient was apneic with thready pulse.  He did not responded to intranasal Narcan.  He was bagged in route to the emergency department and subsequently intubated on arrival to the ED. CT head demonstrated cerebral edema without loss of white matter differentiation. He was transferred to [facility] for MRI. On 9/11 he was noted to have upper extremity edema on the right. Ultrasound demonstrated acute thrombosis in the mid and distal basilic vein. Hospital course also complicated by aspiration pneumonia. Subsequently transferred to [facility] on 9/13.  Events:  9/6 found down > present to [facility] ED. CT with bilateral cerebellar edema. 9/7 repeat CT normal Unable to be weaned from sedation 9/11 RUE DVT started on tx dose lovenox 9/13 transfer to [facility] for MRI 9/14 chest tube placed with intrapleural fibrinolytic administration 9/16 chest tube removed 9/17 patient extubated, remained agitated and started on precedex 9/19 Cortrak placed  10/4 Attempting to wean cortrak (increase PO intake more liberally over the next 24h per SLP recs) - once cortrak removed can start having reasonable discussion on disposition.  10/5 Cortrak accidentally removed - advancing diet as tolerated 10/6 - overnight agitation - haldol x1 given - continues to complain of "having a panic attack" but then describes reflux symptoms. 10/7-patient continues to have word finding difficulty, received benzos overnight as patient reportedly requested outpatient Valium which she has  not been on and does not currently take.  We will attempt to limit CNS depressing medications given his ongoing mental status changes from previous baseline secondary to below. Palliative will follow along from a distance which is certainly reasonable given his stable nature. 10/8 -diet continues to improve, ate most of his dinner, requesting breakfast this morning, agitated overnight received hydroxyzine with moderate improvement, will continue as needed  Assessment & Plan:   Principal Problem:   Acute encephalopathy Active Problems:   Fever   Pleural effusion   Deep venous thrombosis (HCC)   Acute respiratory failure with hypoxia (HCC)   Cerebrovascular accident (CVA) ([place])   Protein-calorie malnutrition, severe   Anoxic brain injury ([place])  Goals of care  -Lengthy discussion with daughter previously by myself and prior physicians.  Family expectations is that patient will continue to recover back to previous baseline which we discussed is unlikely to occur given patient's stable nature at this time difficult to reorient or distract, mood is quite labile, has difficulty word finding and is very impulsive in his behavior. -Prior disposition was somewhat limited by NG tube placement with prior concern for possible PEG tube placement, now that dietary intake is somewhat improving patient may be able to discharge soon given improving p.o. intake.. -Daughter continues to request evaluation for CIR which PT and myself explained were not appropriate given patient's current therapy requirements; however, SNF placement is, and remains, appropriate. Goal is to transfer to SNF once patient's PO intake is adequate(again his PO intake is limited by his impulsive nature/confusion/difficulty orienting to his surroundings - requires multiple queues to eat/oriented to food being available).  Acute toxic metabolic encephalopathy in the setting of ischemic stroke, stable: -On high-dose thiamine -In the setting  of acute stroke/toxic encephalopathy versus possible previous anoxic event as patient was found down in the field of unclear etiology and duration -with cocaine positive UDS -Neurology recommend continue with thiamine. When patient is off of anticoagulation, aspirin should be started - he does not require DAPT. -Per neurology high suspicion for anoxic brain injury versus toxin induced encephalopathy and ischemic strokes given negative EEG. -Continue to limit patient's CNS depressing medications, certainly avoid benzodiazepines given patient's history, continue hydroxyzine PRN -Patient continues to become agitated likely secondary to ongoing confusion and difficulty with orientation, difficulty with word finding, poorly redirectable and his mood is quite labile, somewhat histrionic at times without clear provocation or inciting events.   Multifocal cerebral infarcts: -Neurology recommended to start aspirin once patient is off of anticoagulation -Currently on Lovenox for treatment of vein thrombosis. -High risk for falls, discussed fall risk with family, safe disposition is paramount as below  Acute hypoxic respiratory failure secondary to aspiration pneumonia, resolved Right-sided aspiration pneumonia with parapneumonic effusion: -Status post chest tube placement with intrapleural lytics on 9/14, chest tube removed 9/16. -Completed 7 days of Zosyn -Intubated on admission outside hospital 9/6. -Extubated 9/17. -Currently on Room air.    Superficial vein thrombosis 9/11 Right basilic vein thrombus -Due to increase risk for progression of thrombosis patient was started on Lovenox per previous discussion with oncology (Dr. Clelia Croft).  Dysphagia/nutrition: Patient is being followed by SLP, they have now advanced him to dysphagia 3 diet.   NG dislodged late 11/11/21, diet advancing appropriately, will leave out NG tube and follow p.o. intake/calorie count, strict I's and O's.   Thrombocytosis: We  need to add aspirin once off of anticoagulation. Monitor.    Mild liver dysfunction/abnormal LFTs History of hepatitis C, improved.   Diarrhea, resolved:     DVT prophylaxis:   Lovenox   Code Status: Full Code  Family Communication: Daughter [name] updated at length over the phone 10/5, discussed disposition planning now that NG tube has been removed safely and tolerating p.o.  PT OT to reevaluate, she continues to request possible disposition to CIR, he has not qualified previously.  At this time pending improved PO intake will plan to transition to SNF pending bed availability and insurance approval.  Status is: Inpatient Remains inpatient appropriate because: Needs placement.   Estimated body mass index is 22.52 kg/m as calculated from the following:   Height as of this encounter: 5\' 10"  (1.778 m).   Weight as of this encounter: 71.2 kg.    Nutritional Assessment: Body mass index is 22.52 kg/m. Seen by dietician.  I agree with the assessment and plan as outlined below: Nutrition Status: Nutrition Problem: Severe Malnutrition Etiology: social / environmental circumstances (drug abuse) Signs/Symptoms: severe fat depletion, severe muscle depletion Interventions: Tube feeding  . Skin Assessment: I have examined the patient's skin and I agree with the wound assessment as performed by the wound care RN as outlined below:    Consultants:  Neurology Palliative care  Procedures:  As above  Antimicrobials:  Anti-infectives (From admission, onward)    Start     Dose/Rate Route Frequency Ordered Stop   10/23/21 1000  cefTRIAXone (ROCEPHIN) 2 g in sodium chloride 0.9 % 100 mL IVPB  Status:  Discontinued        2 g 200 mL/hr over 30 Minutes Intravenous Every 24 hours 10/22/21 0036 10/22/21 0037   10/23/21 1000  cefTRIAXone (ROCEPHIN) 2 g in sodium chloride 0.9 % 100 mL IVPB  Status:  Discontinued        2 g 200 mL/hr over 30 Minutes Intravenous Every 24 hours 10/22/21 0037  10/22/21 0827   10/22/21 0930  piperacillin-tazobactam (ZOSYN) IVPB 3.375 g        3.375 g 12.5 mL/hr over 240 Minutes Intravenous Every 8 hours 10/22/21 0833 10/29/21 0559   10/22/21 0915  ceFEPIme (MAXIPIME) 2 g in sodium chloride 0.9 % 100 mL IVPB  Status:  Discontinued        2 g 200 mL/hr over 30 Minutes Intravenous Every 8 hours 10/22/21 0827 10/22/21 0833         Subjective:  Seen and examined. No complaints. Alert but not oriented at his baseline. Objective: Vitals:   11/14/21 1505 11/14/21 2036 11/15/21 0026 11/15/21 0354  BP: 124/75 126/87 (!) 130/106 105/86  Pulse: (!) 108 (!) 106 (!) 108 98  Resp: 16 19 19 18   Temp: 98.2 F (36.8 C) 98.5 F (36.9 C) 97.7 F (36.5 C) (!) 97.5 F (36.4 C)  TempSrc:  Oral Oral Oral  SpO2: 99% 98% 100% 98%  Weight:      Height:        Intake/Output Summary (Last 24 hours) at 11/15/2021 0741 Last data filed at 11/15/2021 0244 Gross per 24 hour  Intake 864 ml  Output --  Net 864 ml    Filed Weights   11/08/21 0500 11/12/21 0500 11/13/21 0500  Weight: 70.8 kg 70.6 kg 71.2 kg    Examination:  General exam: Appears calm and comfortable  Respiratory system: Clear to auscultation. Respiratory effort normal. Cardiovascular system: S1 & S2 heard, RRR. No JVD, murmurs, rubs, gallops or clicks. No pedal edema. Gastrointestinal system: Abdomen is nondistended, soft and nontender. No organomegaly or masses felt. Normal bowel sounds heard. Central nervous system: Alert but not oriented.  No focal deficit. Extremities: Symmetric 5 x 5 power. Skin: No rashes, lesions or ulcers.   Data Reviewed: I have personally reviewed following labs and imaging studies  CBC: Recent Labs  Lab 11/13/21 0439  WBC 8.3  HGB 13.8  HCT 40.7  MCV 93.1  PLT 237    Basic Metabolic Panel: Recent Labs  Lab 11/13/21 0439  NA 137  K 3.7  CL 101  CO2 26  GLUCOSE 102*  BUN 19  CREATININE 0.74  CALCIUM 10.0    GFR: Estimated Creatinine  Clearance: 102.6 mL/min (by C-G formula based on SCr of 0.74 mg/dL). Liver Function Tests: No results for input(s): "AST", "ALT", "ALKPHOS", "BILITOT", "PROT", "ALBUMIN" in the last 168 hours.  No results for input(s): "LIPASE", "AMYLASE" in the last 168 hours. Recent Labs  Lab 11/11/21 0527 11/12/21 0505 11/13/21 0439 11/14/21 0340 11/15/21 0638  AMMONIA <10 84* 18 25 32    Coagulation Profile: No results for input(s): "INR", "PROTIME" in the last 168 hours. Cardiac Enzymes: No results for input(s): "CKTOTAL", "CKMB", "CKMBINDEX", "TROPONINI" in the last 168 hours.  BNP (last 3 results) No results for input(s): "PROBNP" in the last 8760 hours. HbA1C: No results for input(s): "HGBA1C" in the last 72 hours. CBG: Recent Labs  Lab 11/11/21 1609 11/11/21 2013 11/12/21 0028 11/12/21 0502 11/12/21 0731  GLUCAP 108* 98 101* 105* 114*    Lipid Profile: No results for input(s): "CHOL", "HDL", "LDLCALC", "TRIG", "CHOLHDL", "LDLDIRECT" in the last 72 hours. Thyroid Function Tests: No results for input(s): "TSH", "T4TOTAL", "FREET4", "T3FREE", "THYROIDAB" in the last 72  hours. Anemia Panel: No results for input(s): "VITAMINB12", "FOLATE", "FERRITIN", "TIBC", "IRON", "RETICCTPCT" in the last 72 hours. Sepsis Labs: No results for input(s): "PROCALCITON", "LATICACIDVEN" in the last 168 hours.  No results found for this or any previous visit (from the past 240 hour(s)).    Radiology Studies: No results found.  Scheduled Meds:  amLODipine  5 mg Oral Daily   enoxaparin (LOVENOX) injection  70 mg Subcutaneous Q12H   feeding supplement  237 mL Oral TID BM   lidocaine  1 patch Transdermal Q24H   melatonin  5 mg Oral QHS   mouth rinse  15 mL Mouth Rinse BID   pantoprazole  40 mg Oral Daily   thiamine  100 mg Oral Daily   Continuous Infusions:     LOS: 25 days   Azucena Fallen, DO [facility]  Available 7a-7p - please see Amion for cross  coverage  11/15/2021, 7:41 AM

## 2021-11-16 ENCOUNTER — Telehealth (HOSPITAL_COMMUNITY): Payer: Self-pay | Admitting: Pharmacy Technician

## 2021-11-16 ENCOUNTER — Other Ambulatory Visit: Payer: Self-pay

## 2021-11-16 ENCOUNTER — Other Ambulatory Visit (HOSPITAL_COMMUNITY): Payer: Self-pay

## 2021-11-16 ENCOUNTER — Encounter (HOSPITAL_COMMUNITY): Payer: Self-pay | Admitting: Pulmonary Disease

## 2021-11-16 DIAGNOSIS — G9340 Encephalopathy, unspecified: Secondary | ICD-10-CM | POA: Diagnosis not present

## 2021-11-16 MED ORDER — INFLUENZA VAC SPLIT QUAD 0.5 ML IM SUSY
0.5000 mL | PREFILLED_SYRINGE | INTRAMUSCULAR | Status: AC
  Administered 2021-11-18: 0.5 mL via INTRAMUSCULAR

## 2021-11-16 MED ORDER — ORAL CARE MOUTH RINSE: 15.0000 mL | OROMUCOSAL | Status: DC | PRN

## 2021-11-16 MED ORDER — PNEUMOCOCCAL 20-VAL CONJ VACC 0.5 ML IM SUSY
0.5000 mL | PREFILLED_SYRINGE | INTRAMUSCULAR | Status: AC
  Administered 2021-11-19: 0.5 mL via INTRAMUSCULAR
  Filled 2021-11-16: qty 0.5

## 2021-11-16 NOTE — TOC Benefit Eligibility Note (Signed)
Patient Teacher, [employer] as a foreign language completed.    The patient is currently admitted and upon discharge could be taking Eliquis 5 mg.  The current 30 day co-pay is $0.00.   The patient is currently admitted and upon discharge could be taking Xarelto 20 mg.  The current 30 day co-pay is $0.00.   The patient is insured through [employer] [place] Medicaid     Lyndel Safe, [place] Patient Advocate Specialist [place] Patient Advocate Team Direct Number: [phone]  Fax: [phone]

## 2021-11-16 NOTE — Progress Notes (Signed)
Physical Therapy Treatment Patient Details Name: [name] MRN: [id number] DOB: 01-09-1966 Today's Date: 11/16/2021   History of Present Illness Pt is 56 yo male admitted on to [facility] health in [place] on 9/6 after being found unresponsive.  Pt transferred to [facility] on 10/21/21.  Pt with acute toxic metabolic encephalopathy vs encephalitis in pt with acute ischemic strokes per neurology.  9/11 R UE DVT started on lovenox, 9/14-9/16 chest tube, ETT 9/6-9/18.  Hx of polysubstance abuse, hep C, and GERD.    PT Comments    Pt with significant improvement this date from functional status. Pt more verbal and initiating conversation despite not always being relevant or appropriate. Pt was able to ambulate 150' with HHA this date for the first time during his stay. Pt remains to demo severe cognitive impairments in addition to high fall risk due to impulsivity, decreased insight to safety and deficits, and impaired balance. Pt remains confused, not oriented to place, date, or situation. Pt turned his head to his name but still will not state his name. Pt emotionally labile today with poor attention and STM. Recommend AIR upon d/c to help progress pt to supervision level of function for safe transition home with dtr. Acute PT to cont to follow.    Recommendations for follow up therapy are one component of a multi-disciplinary discharge planning process, led by the attending physician.  Recommendations may be updated based on patient status, additional functional criteria and insurance authorization.  Follow Up Recommendations  Acute inpatient rehab (3hours/day) Can patient physically be transported by private vehicle: No   Assistance Recommended at Discharge Frequent or constant Supervision/Assistance  Patient can return home with the following Assistance with cooking/housework;Assistance with feeding;Help with stairs or ramp for entrance;A little help with walking and/or transfers;A little help with  bathing/dressing/bathroom;Direct supervision/assist for medications management;Direct supervision/assist for financial management;Assist for transportation   Equipment Recommendations  Other (comment) (TBD)    Recommendations for Other Services Rehab consult     Precautions / Restrictions Precautions Precautions: Fall Precaution Comments: posey belt in the bed Restrictions Weight Bearing Restrictions: No     Mobility  Bed Mobility Overal bed mobility: Needs Assistance Bed Mobility: Supine to Sit     Supine to sit: Min assist Sit to supine: Min assist   General bed mobility comments: pt impulsively moving to try to get OOB despite bed rail being up and posey belt being on. Pt requiring minA for safety and to prevent fall OOB    Transfers Overall transfer level: Needs assistance Equipment used: 2 person hand held assist Transfers: Sit to/from Stand Sit to Stand: +2 safety/equipment, Min assist           General transfer comment: minA to steady while pt impulsively powered up. Pt with wide base of support and instability upon standing initially quickly moving feet in/out to find his balance. pt with lateral and ant/post sway in standing    Ambulation/Gait Ambulation/Gait assistance: Min assist, +2 safety/equipment Gait Distance (Feet): 150 Feet Assistive device: 2 person hand held assist Gait Pattern/deviations: Step-through pattern, Decreased stride length, Staggering left, Staggering right, Scissoring, Ataxic, Narrow base of support Gait velocity: decr Gait velocity interpretation: <1.31 ft/sec, indicative of household ambulator   General Gait Details: pt initially minAx2 for ambulation progressing to minAx1. Pt with noted instability with significant lateral sway and vearing to the L and R, more commondly the R, pt would see obstacles and would try to move around them however very delayed with impaired co-ordination  requiring minA to prevent fall, pt unable to follow  directional cues to navigate the halls, pt very easily distracted with noted LOB with every head turn, pt with several episodes of cross over gait in attempt to catch his balance   Stairs             Wheelchair Mobility    Modified Rankin (Stroke Patients Only) Modified Rankin (Stroke Patients Only) Pre-Morbid Rankin Score: No symptoms Modified Rankin: Moderately severe disability     Balance Overall balance assessment: Needs assistance Sitting-balance support: No upper extremity supported, Feet supported Sitting balance-Leahy Scale: Fair Sitting balance - Comments: Close supervision A for safety. Postural control: Posterior lean Standing balance support: Bilateral upper extremity supported Standing balance-Leahy Scale: Poor Standing balance comment: UE support for balance in standing                            Cognition Arousal/Alertness: Awake/alert Behavior During Therapy: Impulsive, Restless Overall Cognitive Status: Impaired/Different from baseline Area of Impairment: Orientation, Attention, Following commands, Safety/judgement, Awareness, Problem solving                 Orientation Level: Disoriented to, Place, Time, Situation (pt turned head to name but doesn't report his name even when given choices) Current Attention Level: Focused   Following Commands: Follows one step commands inconsistently, Follows one step commands with increased time Safety/Judgement: Decreased awareness of safety, Decreased awareness of deficits Awareness: Emergent Problem Solving: Slow processing, Requires verbal cues, Requires tactile cues (impulsively acts) General Comments: Emotionally labile crying several times throughout session. Pt easily distracted, requires constant verbal cues to stay on task. Despite being reoriented to place, date, and situation pt unable to recall and becomes tangential about unrelated topic. Pt able to state needs 50% of the time but can not  elaborate or process what he needs to fix his reported problem, pt unable to navigate hallways back to room, pt with decreased insight to safety and deficits        Exercises      General Comments General comments (skin integrity, edema, etc.): vss      Pertinent Vitals/Pain Pain Assessment Pain Assessment: Faces Faces Pain Scale: Hurts a little bit Pain Location: reports bottom to be sore, pt constantly trying to get off of it Pain Descriptors / Indicators: Sore Pain Intervention(s): Monitored during session    Home Living Family/patient expects to be discharged to:: Skilled nursing facility Living Arrangements: Alone                      Prior Function            PT Goals (current goals can now be found in the care plan section) Progress towards PT goals: Progressing toward goals    Frequency    Min 3X/week      PT Plan Current plan remains appropriate;Frequency needs to be updated    Co-evaluation              AM-PAC PT "6 Clicks" Mobility   Outcome Measure  Help needed turning from your back to your side while in a flat bed without using bedrails?: A Little Help needed moving from lying on your back to sitting on the side of a flat bed without using bedrails?: A Little Help needed moving to and from a bed to a chair (including a wheelchair)?: A Lot Help needed standing up from a chair using your  arms (e.g., wheelchair or bedside chair)?: A Lot Help needed to walk in hospital room?: A Lot Help needed climbing 3-5 steps with a railing? : Total 6 Click Score: 13    End of Session Equipment Utilized During Treatment: Gait belt Activity Tolerance: Patient tolerated treatment well Patient left: in bed;with call bell/phone within reach;with nursing/sitter in room;with restraints reapplied (posey belt applied, attempted to leave pt in the chair with geomat cushion and posey belt however pt kept trying to get up out of chair due to his bottom being  sore) Nurse Communication: Mobility status PT Visit Diagnosis: Other abnormalities of gait and mobility (R26.89);Muscle weakness (generalized) (M62.81);Difficulty in walking, not elsewhere classified (R26.2);Ataxic gait (R26.0)     Time: [id number] PT Time Calculation (min) (ACUTE ONLY): 29 min  Charges:  $Gait Training: 23-37 mins                     Kittie Plater, PT, DPT Acute Rehabilitation Services Secure chat preferred Office #: [phone]    [name] 11/16/2021, 1:01 PM

## 2021-11-16 NOTE — Telephone Encounter (Signed)
Pharmacy Patient Advocate Encounter  Insurance verification completed.    The patient is insured through [employer] [place] [employer]   The patient is currently admitted and ran test claims for the following: Eliquis, Xarelto.  Copays and coinsurance results were relayed to Inpatient clinical team.

## 2021-11-16 NOTE — Progress Notes (Signed)
PROGRESS NOTE    [name]  XIP:[id number] DOB: October 10, 1965 DOA: 10/21/2021 PCP: [name], MD   Brief Narrative:  56 year old WM PMHx Polysubstance abuse, Hepatitis C,    Presented to [facility] in [place] on 9/6 after being found down.  He was last known well at approximately 13:00 hours that day however EMS was called for wellness check at some time later on.  He was found to be down and unresponsive.  On EMS evaluation the patient was apneic with thready pulse.  He did not responded to intranasal Narcan.  He was bagged in route to the emergency department and subsequently intubated on arrival to the ED. CT head demonstrated cerebral edema without loss of white matter differentiation. He was transferred to [facility] for MRI. On 9/11 he was noted to have upper extremity edema on the right. Ultrasound demonstrated acute thrombosis in the mid and distal basilic vein. Hospital course also complicated by aspiration pneumonia. Subsequently transferred to [facility] on 9/13.  Events:  9/6 found down > present to [facility] ED. CT with bilateral cerebellar edema. 9/7 repeat CT normal Unable to be weaned from sedation 9/11 RUE DVT started on tx dose lovenox 9/13 transfer to [facility] for MRI 9/14 chest tube placed with intrapleural fibrinolytic administration 9/16 chest tube removed 9/17 patient extubated, remained agitated and started on precedex 9/19 Cortrak placed  10/4 Attempting to wean cortrak (increase PO intake more liberally over the next 24h per SLP recs) - once cortrak removed can start having reasonable discussion on disposition.  10/5 Cortrak accidentally removed - advancing diet as tolerated 10/6 - overnight agitation - haldol x1 given - continues to complain of "having a panic attack" but then describes reflux symptoms. 10/7-patient continues to have word finding difficulty, received benzos overnight as patient reportedly requested outpatient Valium which she has  not been on and does not currently take.  We will attempt to limit CNS depressing medications given his ongoing mental status changes from previous baseline secondary to below. Palliative will follow along from a distance which is certainly reasonable given his stable nature. 10/8 - diet continues to improve, ate most of his dinner, requesting breakfast this morning, agitated overnight received hydroxyzine with moderate improvement, will continue as needed 10/9 -patient continues to progress, remains impulsive, difficult to reorient but able to ambulate somewhat today with physical therapy, recommendations pending but certainly approaching safe disposition to facility.  Unsafe discharge home alone or with family given need for 24-hour care and monitoring given his current mental status.  Assessment & Plan:   Principal Problem:   Acute encephalopathy Active Problems:   Fever   Pleural effusion   Deep venous thrombosis (HCC)   Acute respiratory failure with hypoxia (HCC)   Cerebrovascular accident (CVA) (HCC)   Protein-calorie malnutrition, severe   Anoxic brain injury (HCC)  Goals of care  -Lengthy discussion with daughter previously by myself and prior physicians.  Family expectations is that patient will continue to recover back to previous baseline which we discussed is unlikely to occur given patient's stable nature at this time difficult to reorient or distract, mood is quite labile, has difficulty word finding and is very impulsive in his behavior. -Prior disposition was somewhat limited by NG tube placement with prior concern for possible PEG tube placement, now that dietary intake is somewhat improving patient may be able to discharge soon given improving p.o. intake. -  Acute toxic metabolic encephalopathy in the setting of ischemic stroke, stable: -On  high-dose thiamine -In the setting of acute stroke/toxic encephalopathy versus possible previous anoxic event as patient was found  down in the field of unclear etiology and duration -with cocaine positive UDS -Neurology recommend continue with thiamine. When patient is off of anticoagulation, aspirin should be started - he does not require DAPT. -Per neurology high suspicion for anoxic brain injury versus toxin induced encephalopathy and ischemic strokes given negative EEG. -Continue to limit patient's CNS depressing medications, certainly avoid benzodiazepines given patient's history, continue hydroxyzine PRN -Patient continues to become agitated likely secondary to ongoing confusion and difficulty with orientation, difficulty with word finding, poorly redirectable and his mood is quite labile, somewhat histrionic at times without clear provocation or inciting events.   Multifocal cerebral infarcts: -Neurology recommended to start aspirin once patient is off of anticoagulation -Currently on Lovenox for treatment of vein thrombosis. -High risk for falls, discussed fall risk with family, safe disposition is paramount as below  Acute hypoxic respiratory failure secondary to aspiration pneumonia, resolved Right-sided aspiration pneumonia with parapneumonic effusion: -Status post chest tube placement with intrapleural lytics on 9/14, chest tube removed 9/16. -Completed 7 days of Zosyn -Intubated on admission outside hospital 9/6. -Extubated 9/17. -Currently on Room air.    Superficial vein thrombosis 9/11 Right basilic vein thrombus -Due to increase risk for progression of thrombosis patient was started on Lovenox per previous discussion with oncology (Dr. Clelia Croft).  Dysphagia/nutrition: Patient is being followed by SLP, they have now advanced him to dysphagia 3 diet.   NG dislodged late 11/11/21, diet advancing appropriately, will leave out NG tube and follow p.o. intake/calorie count, strict I's and O's.   Thrombocytosis: We need to add aspirin once off of anticoagulation. Monitor.    Mild liver dysfunction/abnormal  LFTs History of hepatitis C, improved.   Diarrhea, resolved:     DVT prophylaxis:   Lovenox   Code Status: Full Code  Family Communication: Daughter [name] updated at length over the phone 10/5, discussed disposition planning now that NG tube has been removed safely and tolerating p.o.  PT OT to reevaluate, she continues to request possible disposition to CIR, he has not qualified previously.  At this time pending improved PO intake will plan to transition to SNF pending bed availability and insurance approval.  Status is: Inpatient Remains inpatient appropriate because: Needs placement.   Estimated body mass index is 22.52 kg/m as calculated from the following:   Height as of this encounter: 5\' 10"  (1.778 m).   Weight as of this encounter: 71.2 kg.    Nutritional Assessment: Body mass index is 22.52 kg/m. Seen by dietician.  I agree with the assessment and plan as outlined below: Nutrition Status: Nutrition Problem: Severe Malnutrition Etiology: social / environmental circumstances (drug abuse) Signs/Symptoms: severe fat depletion, severe muscle depletion Interventions: Tube feeding  . Skin Assessment: I have examined the patient's skin and I agree with the wound assessment as performed by the wound care RN as outlined below:    Consultants:  Neurology Palliative care  Procedures:  As above  Antimicrobials:  Anti-infectives (From admission, onward)    Start     Dose/Rate Route Frequency Ordered Stop   10/23/21 1000  cefTRIAXone (ROCEPHIN) 2 g in sodium chloride 0.9 % 100 mL IVPB  Status:  Discontinued        2 g 200 mL/hr over 30 Minutes Intravenous Every 24 hours 10/22/21 0036 10/22/21 0037   10/23/21 1000  cefTRIAXone (ROCEPHIN) 2 g in sodium chloride 0.9 % 100 mL  IVPB  Status:  Discontinued        2 g 200 mL/hr over 30 Minutes Intravenous Every 24 hours 10/22/21 0037 10/22/21 0827   10/22/21 0930  piperacillin-tazobactam (ZOSYN) IVPB 3.375 g        3.375  g 12.5 mL/hr over 240 Minutes Intravenous Every 8 hours 10/22/21 0833 10/29/21 0559   10/22/21 0915  ceFEPIme (MAXIPIME) 2 g in sodium chloride 0.9 % 100 mL IVPB  Status:  Discontinued        2 g 200 mL/hr over 30 Minutes Intravenous Every 8 hours 10/22/21 0827 10/22/21 0833         Subjective:  Seen and examined. No complaints. Alert but not oriented at his baseline. Objective: Vitals:   11/15/21 1247 11/15/21 1554 11/15/21 2004 11/16/21 0634  BP: 110/83 107/81 (!) 122/100 (!) 124/95  Pulse: (!) 108 (!) 103 97 97  Resp: 18 16 17 18   Temp: 98.2 F (36.8 C) 98.6 F (37 C) 97.8 F (36.6 C) 98.4 F (36.9 C)  TempSrc: Oral Oral Oral Oral  SpO2: 98% 100% 100% 99%  Weight:      Height:        Intake/Output Summary (Last 24 hours) at 11/16/2021 0652 Last data filed at 11/15/2021 1806 Gross per 24 hour  Intake 710 ml  Output --  Net 710 ml    Filed Weights   11/08/21 0500 11/12/21 0500 11/13/21 0500  Weight: 70.8 kg 70.6 kg 71.2 kg    Examination:  General exam: Appears calm and comfortable  Respiratory system: Clear to auscultation. Respiratory effort normal. Cardiovascular system: S1 & S2 heard, RRR. No JVD, murmurs, rubs, gallops or clicks. No pedal edema. Gastrointestinal system: Abdomen is nondistended, soft and nontender. No organomegaly or masses felt. Normal bowel sounds heard. Central nervous system: Alert but not oriented.  No focal deficit. Extremities: Symmetric 5 x 5 power. Skin: No rashes, lesions or ulcers.   Data Reviewed: I have personally reviewed following labs and imaging studies  CBC: Recent Labs  Lab 11/13/21 0439  WBC 8.3  HGB 13.8  HCT 40.7  MCV 93.1  PLT 782    Basic Metabolic Panel: Recent Labs  Lab 11/13/21 0439  NA 137  K 3.7  CL 101  CO2 26  GLUCOSE 102*  BUN 19  CREATININE 0.74  CALCIUM 10.0    GFR: Estimated Creatinine Clearance: 102.6 mL/min (by C-G formula based on SCr of 0.74 mg/dL). Liver Function Tests: No  results for input(s): "AST", "ALT", "ALKPHOS", "BILITOT", "PROT", "ALBUMIN" in the last 168 hours.  No results for input(s): "LIPASE", "AMYLASE" in the last 168 hours. Recent Labs  Lab 11/11/21 0527 11/12/21 0505 11/13/21 0439 11/14/21 0340 11/15/21 0638  AMMONIA <10 84* 18 25 32    Coagulation Profile: No results for input(s): "INR", "PROTIME" in the last 168 hours. Cardiac Enzymes: No results for input(s): "CKTOTAL", "CKMB", "CKMBINDEX", "TROPONINI" in the last 168 hours.  BNP (last 3 results) No results for input(s): "PROBNP" in the last 8760 hours. HbA1C: No results for input(s): "HGBA1C" in the last 72 hours. CBG: Recent Labs  Lab 11/11/21 1609 11/11/21 2013 11/12/21 0028 11/12/21 0502 11/12/21 0731  GLUCAP 108* 98 101* 105* 114*    Lipid Profile: No results for input(s): "CHOL", "HDL", "LDLCALC", "TRIG", "CHOLHDL", "LDLDIRECT" in the last 72 hours. Thyroid Function Tests: No results for input(s): "TSH", "T4TOTAL", "FREET4", "T3FREE", "THYROIDAB" in the last 72 hours. Anemia Panel: No results for input(s): "VITAMINB12", "FOLATE", "FERRITIN", "TIBC", "IRON", "  RETICCTPCT" in the last 72 hours. Sepsis Labs: No results for input(s): "PROCALCITON", "LATICACIDVEN" in the last 168 hours.  No results found for this or any previous visit (from the past 240 hour(s)).    Radiology Studies: No results found.  Scheduled Meds:  amLODipine  5 mg Oral Daily   apixaban  5 mg Oral BID   feeding supplement  237 mL Oral TID BM   lidocaine  1 patch Transdermal Q24H   melatonin  5 mg Oral QHS   mouth rinse  15 mL Mouth Rinse BID   pantoprazole  40 mg Oral Daily   thiamine  100 mg Oral Daily   Continuous Infusions:     LOS: 26 days   Azucena Fallen, DO [facility]  Available 7a-7p - please see Amion for cross coverage  11/16/2021, 6:52 AM

## 2021-11-16 NOTE — Progress Notes (Signed)
Inpatient Rehab Admissions Coordinator:   Per therapy recommendations,  patient was screened for CIR candidacy by Clemens Catholic, MS, CCC-SLP. At this time, Pt. Appears to be a a potential candidate for CIR. Note that Pt. Was previously living alone and will now need 24/7 supervision after CIR in order to be a potential candidate. I will place  order for rehab consult per protocol for full assessment. Please contact me any with questions.  Clemens Catholic, [place], [place] Admissions Coordinator  [phone] ([place]) [phone] (office)

## 2021-11-17 DIAGNOSIS — G9340 Encephalopathy, unspecified: Secondary | ICD-10-CM | POA: Diagnosis not present

## 2021-11-17 LAB — AMMONIA: Ammonia: 22 umol/L (ref 9–35)

## 2021-11-17 NOTE — Progress Notes (Signed)
Speech Language Pathology Treatment: Dysphagia;Cognitive-Linquistic  Patient Details Name: [name] MRN: [id number] DOB: [birth year] Today's Date: 11/17/2021 Time: 0910-0930 SLP Time Calculation (min) (ACUTE ONLY): 20 min  Assessment / Plan / Recommendation Clinical Impression  Session primarily focused on cognition, attention and problem solving, but did observe that pt is tolerating regular diet well, enjoying foods more, more comfortable with intake. No signs of aspiration. Will sign off for dysphagia and continue cognitive interventions. Pt demonstrates improvement today, pain seems controlled and he is less restless and more attentive. His awareness and problem solving are gradually improving. Today with a verbal cue pt recognized he was trying to drink from a lidded cup and with a visual and question cue he initiated opening a straw. He still needed verbal cues to use his right hand to steady the cup while inserting the straw. Pt is increasingly labile today, almost every question leads him to cry and think of his life prior to the hospitalization. 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 Pt is increasingly expressive; attention and word finding are improving, but pt still very hard to redirect. Progress is being made. Will continue efforts.   HPI HPI: 56 year old male with past medical history significant for polysubstance abuse, hepatitis C, and GERD who presented to [facility] in [place] on 9/6 after being found down. He was bagged in route to the emergency department at which he arrived around 2300 hrs.  He was intubated immediately upon arrival to the ED 9/6.  CT of the head demonstrated cerebral edema without loss of white matter  differentiation.  MRI shows Multifocal, primarily small acute infarcts involving bilateral cerebral hemispheres.. Extubated 9/18.      SLP Plan  Continue with current plan of care      Recommendations for follow up therapy are one component of a multi-disciplinary discharge planning process, led by the attending physician.  Recommendations may be updated based on patient status, additional functional criteria and insurance authorization.    Recommendations  Diet recommendations: Regular;Thin liquid Liquids provided via: Cup;Straw Medication Administration: Whole meds with liquid Supervision: Trained caregiver to feed patient;Full supervision/cueing for compensatory strategies Compensations: Slow rate;Small sips/bites;Minimize environmental distractions                Oral Care Recommendations: Oral care BID Follow Up Recommendations: Acute inpatient rehab (3hours/day) Assistance recommended at discharge: Frequent or constant Supervision/Assistance SLP Visit Diagnosis: Cognitive communication deficit (R41.841);Dysphagia, oropharyngeal phase (R13.12) Plan: Continue with current plan of care           Jakeem Grape, Katherene Ponto  11/17/2021, 10:52 AM

## 2021-11-17 NOTE — Progress Notes (Signed)
Occupational Therapy Treatment Patient Details Name: [name] MRN: [id number] DOB: [birth year] Today's Date: 11/17/2021   History of present illness Pt is 56 yo male admitted on to [facility] in [place] on 9/6 after being found unresponsive.  Pt transferred to [facility] on 10/21/21.  Pt with acute toxic metabolic encephalopathy vs encephalitis in pt with acute ischemic strokes per neurology.  9/11 R UE DVT started on lovenox, 9/14-9/16 chest tube, ETT 9/6-9/18.  Hx of polysubstance abuse, hep C, and GERD.   OT comments  Pt is making improvements towards OT goals. Today's session focused on cognition, purposeful ADL (grooming and feeding). Pt remains with severe cognitive impairments requiring at least mod A for all aspects of ADL. He is labile and has decreased safety awareness - however this has improved since his last session with OT. He fatigues quickly and due to his attention requires multimodal cues for any task completion, even a high reward activity like eating. Pt is cooperative and motivated throughout. Provided Pt with built up red handles due to deficits in RUE (weakness and decreased coordination). At this time changed recommendation from SNF to AIR level rehab because Pt can participate in intensity of therapy, is making physical and slight cognitive progress. His cognition is still the biggest concern - in safety awareness, and the ability to perform basic functional tasks like self-feeding and ADL - let alone higher level functioning tasks like medication management or financial management. OT will continue to follow acutely.   Recommendations for follow up therapy are one component of a multi-disciplinary discharge planning process, led by the attending physician.  Recommendations may be updated based on patient status, additional functional criteria and insurance authorization.    Follow Up Recommendations  Acute inpatient rehab (3hours/day)    Assistance Recommended at Discharge  Frequent or constant Supervision/Assistance  Patient can return home with the following  Two people to help with walking and/or transfers;A lot of help with bathing/dressing/bathroom;Assistance with cooking/housework;Assistance with feeding;Help with stairs or ramp for entrance;Assist for transportation;Direct supervision/assist for financial management;Direct supervision/assist for medications management   Equipment Recommendations  Other (comment) (defer to next level of care)    Recommendations for Other Services PT consult;Speech consult    Precautions / Restrictions Precautions Precautions: Fall Precaution Comments: impulsive, labile Restrictions Weight Bearing Restrictions: No       Mobility Bed Mobility Overal bed mobility: Needs Assistance Bed Mobility: Supine to Sit, Sit to Supine     Supine to sit: Supervision Sit to supine: Supervision   General bed mobility comments: Pt frequently laying down and sitting back up again throughout session as attention waxed and wained    Transfers                   General transfer comment: deferred to focus on cognition, feeding, grooming, seated balance     Balance Overall balance assessment: Needs assistance Sitting-balance support: No upper extremity supported, Feet supported Sitting balance-Leahy Scale: Fair Sitting balance - Comments: Close supervision A for safety.                                   ADL either performed or assessed with clinical judgement   ADL Overall ADL's : Needs assistance/impaired Eating/Feeding: Moderate assistance;Sitting;With adaptive utensils Eating/Feeding Details (indicate cue type and reason): initially having trouble holding onto utensil, improved with red built up handle. Pt fed himself 1/4 of meal with constant cues  for direction. Pt with increasing restlessness, verbalizing frustration, and so OT pre-loaded fork for him and he proceeded to feed himself the rest of the  meal. Grooming: Moderate assistance;Wash/dry hands;Wash/dry face Grooming Details (indicate cue type and reason): Pt provided with warm wash cloth, and Pt used appropriately, required assist for food in beard, mod A for throroughness and for constant cues for redirection to task                               General ADL Comments: dcreased safety awareness, cognition, balance, R arm weakness    Extremity/Trunk Assessment Upper Extremity Assessment RUE Deficits / Details: decreased grasp strength, decreased Forward flexion, "It won't move like its supposed to" RUE Sensation: decreased proprioception RUE Coordination: decreased fine motor            Vision   Vision Assessment?: Vision impaired- to be further tested in functional context Additional Comments: reports blurry vision, reported 3 fingers when therapist was holding up one hand   Perception     Praxis Praxis Praxis: Impaired Praxis Impairment Details: Ideation;Initiation;Ideomotor;Motor planning;Perseveration    Cognition Arousal/Alertness: Awake/alert Behavior During Therapy: Impulsive, Restless Overall Cognitive Status: Impaired/Different from baseline Area of Impairment: Orientation, Attention, Following commands, Safety/judgement, Awareness, Problem solving (able to state name today)                 Orientation Level: Disoriented to, Place, Time, Situation (did not know where he was, after being told he was tearful "why am I in the hospital? what happened to me?") Current Attention Level: Focused (requires almost constant cues even when presented with least distraction options)   Following Commands: Follows one step commands inconsistently, Follows one step commands with increased time Safety/Judgement: Decreased awareness of safety, Decreased awareness of deficits Awareness: Emergent ("what is wrong with this arm") Problem Solving: Difficulty sequencing, Requires verbal cues, Requires tactile  cues General Comments: Pt able to tell me his name today, frequently labile throughout session going from crying to laughing, tangential in conversation topic and asking OT "How do I know you" at least 3 times throughout session. Attention is VERY limited and Pt is distracted easily even with a high reward activity like eating. He gets frustrated easily, and is aware that his Right arm is not working as it should. He reports that his vision is also very bad but could not describe it. He has little to no safety awareness.        Exercises      Shoulder Instructions       General Comments      Pertinent Vitals/ Pain       Pain Assessment Pain Assessment: No/denies pain Pain Intervention(s): Monitored during session, Repositioned  Home Living                                          Prior Functioning/Environment              Frequency  Min 2X/week        Progress Toward Goals  OT Goals(current goals can now be found in the care plan section)  Progress towards OT goals: Progressing toward goals  Acute Rehab OT Goals Patient Stated Goal: get R arm working better OT Goal Formulation: With patient Time For Goal Achievement: 11/26/21 Potential to Achieve Goals: [place]  Discharge plan needs to be updated    Co-evaluation                 AM-PAC OT "6 Clicks" Daily Activity     Outcome Measure   Help from another person eating meals?: A Lot Help from another person taking care of personal grooming?: A Lot Help from another person toileting, which includes using toliet, bedpan, or urinal?: A Lot Help from another person bathing (including washing, rinsing, drying)?: A Lot Help from another person to put on and taking off regular upper body clothing?: A Lot Help from another person to put on and taking off regular lower body clothing?: A Lot 6 Click Score: 12    End of Session    OT Visit Diagnosis: Unsteadiness on feet (R26.81);Other  abnormalities of gait and mobility (R26.89);Other symptoms and signs involving cognitive function;Muscle weakness (generalized) (M62.81);Low vision, both eyes (H54.2)   Activity Tolerance Patient tolerated treatment well   Patient Left in bed;with call bell/phone within reach;with nursing/sitter in room   Nurse Communication Mobility status        Time: [id number] OT Time Calculation (min): 35 min  Charges: OT General Charges $OT Visit: 1 Visit OT Treatments $Self Care/Home Management : 8-22 mins $Therapeutic Activity: 8-22 mins  Jesse Sans OTR/L Acute Rehabilitation Services Office: [place] 11/17/2021, 9:58 AM

## 2021-11-17 NOTE — Progress Notes (Signed)
Inpatient Rehab Admissions Coordinator:    I spoke with Pt.'s daughter to discuss potential CIR admit. She is interested but states no  family is able to provide 24/7 support at d/c which Pt. Is going to need, secondary to cognitive deficits. I  explained to her that pt. Cannot come to CIR with the plan to go SNF afterwards. I will not pursue CIR admit for this Pt.  Clemens Catholic, [place], [place] Perez Admissions Coordinator  [phone] ([place]) [phone] (office)

## 2021-11-17 NOTE — Progress Notes (Signed)
Inpatient Rehab Admissions Coordinator:   I met with pt. And spoke to him regarding potential CIR admit. Pt. Moderately confused with 1:1 sitter currently. He is interested but not sure he will have support at home. Daughter has previously stated that Pt. Will not have 24/7 support at d/c but I will reach out to her and confirm.   Clemens Catholic, [name], [place] Admissions Coordinator  [phone] ([place]) [phone] (office)

## 2021-11-17 NOTE — Progress Notes (Signed)
PROGRESS NOTE    [name]  QIO:[id number] DOB: 08-30-1965 DOA: 10/21/2021 PCP: [name], MD   Brief Narrative:  56 year old WM PMHx Polysubstance abuse, Hepatitis C,    Presented to [facility] in [place] on 9/6 after being found down.  He was last known well at approximately 13:00 hours that day however EMS was called for wellness check at some time later on.  He was found to be down and unresponsive.  On EMS evaluation the patient was apneic with thready pulse.  He did not responded to intranasal Narcan.  He was bagged in route to the emergency department and subsequently intubated on arrival to the ED. CT head demonstrated cerebral edema without loss of white matter differentiation. He was transferred to [facility] for MRI. On 9/11 he was noted to have upper extremity edema on the right. Ultrasound demonstrated acute thrombosis in the mid and distal basilic vein. Hospital course also complicated by aspiration pneumonia. Subsequently transferred to [facility] on 9/13.  Events:  9/6 found down > present to [facility] ED. CT with bilateral cerebellar edema. 9/7 repeat CT normal Unable to be weaned from sedation 9/11 RUE DVT started on tx dose lovenox 9/13 transfer to [facility] for MRI 9/14 chest tube placed with intrapleural fibrinolytic administration 9/16 chest tube removed 9/17 patient extubated, remained agitated and started on precedex 9/19 Cortrak placed  10/4 Attempting to wean cortrak (increase PO intake more liberally over the next 24h per SLP recs) - once cortrak removed can start having reasonable discussion on disposition.  10/5 Cortrak accidentally removed - advancing diet as tolerated 10/6 - overnight agitation - haldol x1 given - continues to complain of "having a panic attack" but then describes reflux symptoms. 10/7-patient continues to have word finding difficulty, received benzos overnight as patient reportedly requested outpatient Valium which she has  not been on and does not currently take.  We will attempt to limit CNS depressing medications given his ongoing mental status changes from previous baseline secondary to below. Palliative will follow along from a distance which is certainly reasonable given his stable nature. 10/8 - diet continues to improve, initiate hydroxyzine PRN 10/9 -patient continues to progress, remains impulsive, difficult to reorient but able to ambulate somewhat today with physical therapy, recommendations pending but certainly approaching safe disposition to facility.  Unsafe discharge home alone or with family given need for 24-hour care and monitoring given his current mental status. 10/10 Patient now being evaluated for CIR placement - not appropriate for this level of care per CIR - (remains very impulsive and requires 24h monitoring but more redirectable as of late). PO intake markedly improving - no indication for PEG tube at this time (ate 100% of breakfast/lunch with minimal assistance).  Assessment & Plan:   Principal Problem:   Acute encephalopathy Active Problems:   Fever   Pleural effusion   Deep venous thrombosis (HCC)   Acute respiratory failure with hypoxia (HCC)   Cerebrovascular accident (CVA) (HCC)   Protein-calorie malnutrition, severe   Anoxic brain injury (HCC)  Goals of care  -Lengthy discussion with daughter previously by myself and prior physicians.  Family expectations is that patient will continue to recover back to previous baseline which we discussed is unlikely to occur given patient's stable nature at this time difficult to reorient or distract, mood is quite labile, has difficulty word finding and is very impulsive in his behavior. -Prior disposition was somewhat limited by NG tube placement with prior concern for possible  PEG tube placement, now that dietary intake is markedly improving there is no further need to pursue PEG placement.  Acute toxic metabolic encephalopathy in the  setting of ischemic stroke, stable: -On high-dose thiamine -In the setting of acute stroke/toxic encephalopathy versus possible previous anoxic event as patient was found down in the field of unclear etiology and duration -with cocaine positive UDS -Neurology recommend continue with thiamine. When patient is off of anticoagulation, aspirin should be started - he does not require DAPT. -Per neurology high suspicion for anoxic brain injury versus toxin induced encephalopathy and ischemic strokes given negative EEG. -Continue to limit patient's CNS depressing medications, certainly avoid benzodiazepines given patient's history - Continue hydroxyzine PRN -Patient continues to become agitated likely secondary to ongoing confusion and difficulty with orientation, difficulty with word finding, poorly redirectable and his mood is quite labile, somewhat histrionic at times without clear provocation or inciting events.   Multifocal cerebral infarcts: -Neurology recommended to start aspirin once patient is off of anticoagulation (presumed DVT) -Currently on Lovenox for treatment of vein thrombosis. -High risk for falls, discussed fall risk with family, safe disposition is paramount as below  Acute hypoxic respiratory failure secondary to aspiration pneumonia, resolved Right-sided aspiration pneumonia with parapneumonic effusion: -Status post chest tube placement with intrapleural lytics on 9/14, chest tube removed 9/16. -Completed 7 days of Zosyn -Intubated on admission outside hospital 9/6. -Extubated 9/17. -Currently on Room air.    Superficial vein thrombosis 3/66 Right basilic vein thrombus -Due to increase risk for progression of thrombosis patient was started on Lovenox per previous discussion with oncology (Dr. Alen Blew).  Dysphagia/nutrition:  - Patient is being followed by SLP, diet continues to advance appropriately.   - NG dislodged late 11/11/21 - PO intake markedly improving over the past  72h - eating majority if not 100% of meals. No indication for PEG placement at this time.   Thrombocytosis: We need to add aspirin once off of anticoagulation. Monitor.    Mild liver dysfunction/abnormal LFTs History of hepatitis C, improved.   Diarrhea, resolved:    DVT prophylaxis:   Lovenox   Code Status: Full Code  Family Communication: Daughter [name] updated at length over the phone 10/9, discussed disposition planning now that NG tube has been removed, he is safely tolerating p.o. - no indication for PEG placement.  PT OT to reevaluate, she continues to request possible disposition to CIR - which has been evaluated and declined for above reasons. Medically stable for discharge - likely to SNF given family cannot provide 24/7 monitoring patient requires.  Status is: Inpatient Remains inpatient appropriate because: Needs placement.   Estimated body mass index is 22.52 kg/m as calculated from the following:   Height as of this encounter: 5\' 10"  (1.778 m).   Weight as of this encounter: 71.2 kg.    Nutritional Assessment: Body mass index is 22.52 kg/m.Marland Kitchen Seen by dietician.  I agree with the assessment and plan as outlined below: Nutrition Status: Nutrition Problem: Severe Malnutrition Etiology: social / environmental circumstances (drug abuse) Signs/Symptoms: severe fat depletion, severe muscle depletion Interventions: Tube feeding  . Skin Assessment: I have examined the patient's skin and I agree with the wound assessment as performed by the wound care RN as outlined below:    Consultants:  Neurology Palliative care  Procedures:  As above  Antimicrobials:  Anti-infectives (From admission, onward)    Start     Dose/Rate Route Frequency Ordered Stop   10/23/21 1000  cefTRIAXone (ROCEPHIN) 2 g in  sodium chloride 0.9 % 100 mL IVPB  Status:  Discontinued        2 g 200 mL/hr over 30 Minutes Intravenous Every 24 hours 10/22/21 0036 10/22/21 0037   10/23/21 1000   cefTRIAXone (ROCEPHIN) 2 g in sodium chloride 0.9 % 100 mL IVPB  Status:  Discontinued        2 g 200 mL/hr over 30 Minutes Intravenous Every 24 hours 10/22/21 0037 10/22/21 0827   10/22/21 0930  piperacillin-tazobactam (ZOSYN) IVPB 3.375 g        3.375 g 12.5 mL/hr over 240 Minutes Intravenous Every 8 hours 10/22/21 0833 10/29/21 0559   10/22/21 0915  ceFEPIme (MAXIPIME) 2 g in sodium chloride 0.9 % 100 mL IVPB  Status:  Discontinued        2 g 200 mL/hr over 30 Minutes Intravenous Every 8 hours 10/22/21 0827 10/22/21 0833         Subjective:  Seen and examined. No complaints. Alert but not oriented at his baseline. Objective: Vitals:   11/16/21 1222 11/16/21 1731 11/16/21 2019 11/17/21 0619  BP: (!) 118/94 111/77 115/83 (!) 140/90  Pulse: 96 90 99 90  Resp: 16 16 18 18   Temp: 98.2 F (36.8 C) 98.1 F (36.7 C) 98.3 F (36.8 C) 97.8 F (36.6 C)  TempSrc: Oral Oral Oral Oral  SpO2: 98% 99% 98% 98%  Weight:      Height:        Intake/Output Summary (Last 24 hours) at 11/17/2021 0750 Last data filed at 11/16/2021 2200 Gross per 24 hour  Intake 720 ml  Output --  Net 720 ml    Filed Weights   11/08/21 0500 11/12/21 0500 11/13/21 0500  Weight: 70.8 kg 70.6 kg 71.2 kg    Examination:  General exam: Appears calm and comfortable  Respiratory system: Clear to auscultation. Respiratory effort normal. Cardiovascular system: S1 & S2 heard, RRR. No JVD, murmurs, rubs, gallops or clicks. No pedal edema. Gastrointestinal system: Abdomen is nondistended, soft and nontender. No organomegaly or masses felt. Normal bowel sounds heard. Central nervous system: Alert but not oriented.  No focal deficit. Extremities: Symmetric 5 x 5 power. Skin: No rashes, lesions or ulcers. Mild erythema at gluteal folds with excoriations from scratching  Data Reviewed: I have personally reviewed following labs and imaging studies  CBC: Recent Labs  Lab 11/13/21 0439  WBC 8.3  HGB 13.8  HCT  40.7  MCV 93.1  PLT 237    Basic Metabolic Panel: Recent Labs  Lab 11/13/21 0439  NA 137  K 3.7  CL 101  CO2 26  GLUCOSE 102*  BUN 19  CREATININE 0.74  CALCIUM 10.0    GFR: Estimated Creatinine Clearance: 102.6 mL/min (by C-G formula based on SCr of 0.74 mg/dL). Liver Function Tests: No results for input(s): "AST", "ALT", "ALKPHOS", "BILITOT", "PROT", "ALBUMIN" in the last 168 hours.  No results for input(s): "LIPASE", "AMYLASE" in the last 168 hours. Recent Labs  Lab 11/12/21 0505 11/13/21 0439 11/14/21 0340 11/15/21 0638 11/17/21 0446  AMMONIA 84* 18 25 32 22    Coagulation Profile: No results for input(s): "INR", "PROTIME" in the last 168 hours. Cardiac Enzymes: No results for input(s): "CKTOTAL", "CKMB", "CKMBINDEX", "TROPONINI" in the last 168 hours.  BNP (last 3 results) No results for input(s): "PROBNP" in the last 8760 hours. HbA1C: No results for input(s): "HGBA1C" in the last 72 hours. CBG: Recent Labs  Lab 11/11/21 1609 11/11/21 2013 11/12/21 0028 11/12/21 0502 11/12/21 01/12/22  GLUCAP 108* 98 101* 105* 114*    Lipid Profile: No results for input(s): "CHOL", "HDL", "LDLCALC", "TRIG", "CHOLHDL", "LDLDIRECT" in the last 72 hours. Thyroid Function Tests: No results for input(s): "TSH", "T4TOTAL", "FREET4", "T3FREE", "THYROIDAB" in the last 72 hours. Anemia Panel: No results for input(s): "VITAMINB12", "FOLATE", "FERRITIN", "TIBC", "IRON", "RETICCTPCT" in the last 72 hours. Sepsis Labs: No results for input(s): "PROCALCITON", "LATICACIDVEN" in the last 168 hours.  No results found for this or any previous visit (from the past 240 hour(s)).    Radiology Studies: No results found.  Scheduled Meds:  amLODipine  5 mg Oral Daily   apixaban  5 mg Oral BID   feeding supplement  237 mL Oral TID BM   [START ON 11/18/2021] influenza vac split quadrivalent PF  0.5 mL Intramuscular Tomorrow-1000   lidocaine  1 patch Transdermal Q24H   melatonin  5  mg Oral QHS   pantoprazole  40 mg Oral Daily   [START ON 11/18/2021] pneumococcal 20-valent conjugate vaccine  0.5 mL Intramuscular Tomorrow-1000   thiamine  100 mg Oral Daily   Continuous Infusions:     LOS: 27 days   Azucena Fallen, DO [facility]  Available 7a-7p - please see Amion for cross coverage  11/17/2021, 7:50 AM

## 2021-11-18 DIAGNOSIS — I639 Cerebral infarction, unspecified: Secondary | ICD-10-CM | POA: Diagnosis not present

## 2021-11-18 DIAGNOSIS — G9340 Encephalopathy, unspecified: Secondary | ICD-10-CM | POA: Diagnosis not present

## 2021-11-18 MED ORDER — QUETIAPINE FUMARATE 25 MG PO TABS
25.0000 mg | ORAL_TABLET | Freq: Every day | ORAL | Status: DC
  Administered 2021-11-18: 25 mg via ORAL
  Filled 2021-11-18: qty 1

## 2021-11-18 MED ORDER — PANTOPRAZOLE SODIUM 40 MG PO TBEC
40.0000 mg | DELAYED_RELEASE_TABLET | Freq: Every day | ORAL | Status: DC
  Administered 2021-11-19 – 2021-11-29 (×11): 40 mg via ORAL
  Filled 2021-11-18 (×13): qty 1

## 2021-11-18 NOTE — Plan of Care (Signed)
  Problem: Education: Goal: Knowledge of General Education information will improve Description: Including pain rating scale, medication(s)/side effects and non-pharmacologic comfort measures Outcome: Progressing   Problem: Health Behavior/Discharge Planning: Goal: Ability to manage health-related needs will improve Outcome: Progressing   Problem: Clinical Measurements: Goal: Ability to maintain clinical measurements within normal limits will improve Outcome: Progressing Goal: Will remain free from infection Outcome: Progressing Goal: Diagnostic test results will improve Outcome: Progressing Goal: Respiratory complications will improve Outcome: Progressing Goal: Cardiovascular complication will be avoided Outcome: Progressing   Problem: Activity: Goal: Risk for activity intolerance will decrease Outcome: Progressing   Problem: Nutrition: Goal: Adequate nutrition will be maintained Outcome: Progressing   Problem: Elimination: Goal: Will not experience complications related to bowel motility Outcome: Progressing Goal: Will not experience complications related to urinary retention Outcome: Progressing   Problem: Safety: Goal: Ability to remain free from injury will improve Outcome: Progressing   Problem: Skin Integrity: Goal: Risk for impaired skin integrity will decrease Outcome: Progressing   Problem: Safety: Goal: Non-violent Restraint(s) Outcome: Progressing

## 2021-11-18 NOTE — Progress Notes (Signed)
Physical Therapy Treatment Patient Details Name: [name] MRN: [id number] DOB: Aug 28, 1965 Today's Date: 11/18/2021   History of Present Illness Pt is 56 yo male admitted on to [place] in [place] on 9/6 after being found unresponsive.  Pt transferred to [facility] on 10/21/21.  Pt with acute toxic metabolic encephalopathy vs encephalitis in pt with acute ischemic strokes per neurology.  9/11 R UE DVT started on lovenox, 9/14-9/16 chest tube, ETT 9/6-9/18.  Hx of polysubstance abuse, hep C, and GERD.    PT Comments    Pt very emotional and tearful today. Pt hugging PT and crying because "I"m so thankful I get to walk." Pt improving from mobility stand point however continues to have severe cognitive impairment. Pt impulsive with decreased insight to safety and deficits. Pt continues to have word finding difficulties, delayed processing, confusion, and difficulty sequencing. Pt continues to require 24/7 assist for safe d/c home. Recommend SNF upon d/c as family states they can not provide this. Acute PT to cont to follow.    Recommendations for follow up therapy are one component of a multi-disciplinary discharge planning process, led by the attending physician.  Recommendations may be updated based on patient status, additional functional criteria and insurance authorization.  Follow Up Recommendations  Skilled nursing-short term rehab (<3 hours/day) Can patient physically be transported by private vehicle: No   Assistance Recommended at Discharge Frequent or constant Supervision/Assistance  Patient can return home with the following Assistance with cooking/housework;Assistance with feeding;Help with stairs or ramp for entrance;A little help with walking and/or transfers;A little help with bathing/dressing/bathroom;Direct supervision/assist for medications management;Direct supervision/assist for financial management;Assist for transportation   Equipment Recommendations  Other (comment) (TBD)     Recommendations for Other Services       Precautions / Restrictions Precautions Precautions: Fall Precaution Comments: impulsive, labile Restrictions Weight Bearing Restrictions: No     Mobility  Bed Mobility Overal bed mobility: Needs Assistance Bed Mobility: Supine to Sit, Sit to Supine     Supine to sit: Min guard Sit to supine: Supervision, Min guard   General bed mobility comments: min guard for safety due to poor self awareness    Transfers Overall transfer level: Needs assistance Equipment used: 1 person hand held assist Transfers: Sit to/from Stand Sit to Stand: Min assist           General transfer comment: tactile cues required to intiate task    Ambulation/Gait Ambulation/Gait assistance: Min assist Gait Distance (Feet): 200 Feet Assistive device: 1 person hand held assist Gait Pattern/deviations: Step-through pattern, Decreased stride length, Staggering left, Staggering right, Scissoring, Ataxic, Narrow base of support Gait velocity: decr Gait velocity interpretation: <1.31 ft/sec, indicative of household ambulator   General Gait Details: pt continues to vear to the L, pt with frequent cross over gait due to being very distracted and poor self awareness with poor attn to task   Stairs             Wheelchair Mobility    Modified Rankin (Stroke Patients Only) Modified Rankin (Stroke Patients Only) Pre-Morbid Rankin Score: No symptoms Modified Rankin: Moderately severe disability     Balance Overall balance assessment: Needs assistance Sitting-balance support: No upper extremity supported, Feet supported Sitting balance-Leahy Scale: Fair Sitting balance - Comments: Close supervision A for safety. Postural control: Posterior lean Standing balance support: Bilateral upper extremity supported Standing balance-Leahy Scale: Poor Standing balance comment: UE support for balance in standing  Cognition Arousal/Alertness: Awake/alert Behavior During Therapy: Impulsive, Restless Overall Cognitive Status: Impaired/Different from baseline Area of Impairment: Orientation, Attention, Following commands, Safety/judgement, Awareness, Problem solving (able to state name today)                 Orientation Level: Disoriented to, Place, Time, Situation (pt remains tearful but really happy to see PT to walk) Current Attention Level: Focused (requires almost constant cues even when presented with least distraction options)   Following Commands: Follows one step commands inconsistently, Follows one step commands with increased time Safety/Judgement: Decreased awareness of safety, Decreased awareness of deficits Awareness: Emergent ("what is wrong with this arm") Problem Solving: Difficulty sequencing, Requires verbal cues, Requires tactile cues General Comments: pt tearful t/o entire session hugging PT and then OT when seen her in the hallway, pt very distracted requiring constant verbal cues, pt with noted word finding difficulties when identifying halloween decor. Pt also couldn't state the correct room numbers despite max verbal cues        Exercises      General Comments General comments (skin integrity, edema, etc.): vss      Pertinent Vitals/Pain Pain Assessment Pain Assessment: No/denies pain    Home Living                          Prior Function            PT Goals (current goals can now be found in the care plan section) Acute Rehab PT Goals PT Goal Formulation: With family Time For Goal Achievement: 11/24/21 Potential to Achieve Goals: Fair Progress towards PT goals: Progressing toward goals    Frequency    Min 3X/week      PT Plan Current plan remains appropriate;Frequency needs to be updated    Co-evaluation              AM-PAC PT "6 Clicks" Mobility   Outcome Measure  Help needed turning from your back to your side while in a  flat bed without using bedrails?: A Little Help needed moving from lying on your back to sitting on the side of a flat bed without using bedrails?: A Little Help needed moving to and from a bed to a chair (including a wheelchair)?: A Lot Help needed standing up from a chair using your arms (e.g., wheelchair or bedside chair)?: A Lot Help needed to walk in hospital room?: A Lot Help needed climbing 3-5 steps with a railing? : Total 6 Click Score: 13    End of Session Equipment Utilized During Treatment: Gait belt Activity Tolerance: Patient tolerated treatment well Patient left: in bed;with call bell/phone within reach;with nursing/sitter in room Psychiatrist present) Nurse Communication: Mobility status PT Visit Diagnosis: Other abnormalities of gait and mobility (R26.89);Muscle weakness (generalized) (M62.81);Difficulty in walking, not elsewhere classified (R26.2);Ataxic gait (R26.0)     Time: 1914-7829 PT Time Calculation (min) (ACUTE ONLY): 17 min  Charges:  $Gait Training: 8-22 mins                     Lewis Shock, PT, DPT Acute Rehabilitation Services Secure chat preferred Office #: [phone]    Iona Hansen 11/18/2021, 1:51 PM

## 2021-11-18 NOTE — Progress Notes (Signed)
Mobility Specialist: Progress Note   11/18/21 1620  Mobility  Activity Ambulated with assistance in hallway  Level of Assistance Contact guard assist, steadying assist  Assistive Device None  Distance Ambulated (ft) 200 ft  Activity Response Tolerated well  $Mobility charge 1 Mobility   Pt received in the bed and agreeable to mobility. Stopped x3 for standing breaks. Contact guard for balance as pt has tendency to drift to the R. Pt back to bed after session with NT present in the room.   [place] Kylil Swopes Mobility Specialist Secure Chat Only

## 2021-11-18 NOTE — Progress Notes (Addendum)
[facility] PROGRESS NOTE   [name] ZDG:[id number] DOB: 17-Jan-1966 DOA: 10/21/2021  PCP: [name], MD  Brief History/Interval Summary: 56 year old WM PMHx Polysubstance abuse, Hepatitis C,    Presented to [facility] in [place] on 9/6 after being found down.  He was last known well at approximately 13:00 hours that day however EMS was called for wellness check at some time later on.  He was found to be down and unresponsive.  On EMS evaluation the patient was apneic with thready pulse.  He did not responded to intranasal Narcan.  He was bagged in route to the emergency department and subsequently intubated on arrival to the ED. CT head demonstrated cerebral edema without loss of white matter differentiation. He was transferred to [place] for MRI. On 9/11 he was noted to have upper extremity edema on the right. Ultrasound demonstrated acute thrombosis in the mid and distal basilic vein. Hospital course also complicated by aspiration pneumonia. Subsequently transferred to [facility] on 9/13.   Events:  9/6 found down > present to [facility] ED. CT with bilateral cerebellar edema. 9/7 repeat CT normal Unable to be weaned from sedation 9/11 RUE DVT started on tx dose lovenox 9/13 transfer to [facility] for MRI 9/14 chest tube placed with intrapleural fibrinolytic administration 9/16 chest tube removed 9/17 patient extubated, remained agitated and started on precedex 9/19 Cortrak placed  10/4 Attempting to wean cortrak (increase PO intake more liberally over the next 24h per SLP recs) - once cortrak removed can start having reasonable discussion on disposition.  10/5 Cortrak accidentally removed - advancing diet as tolerated 10/6 - overnight agitation - haldol x1 given - continues to complain of "having a panic attack" but then describes reflux symptoms. 10/7-patient continues to have word finding difficulty, received benzos overnight as patient reportedly requested  outpatient Valium which she has not been on and does not currently take.  We will attempt to limit CNS depressing medications given his ongoing mental status changes from previous baseline secondary to below. Palliative will follow along from a distance which is certainly reasonable given his stable nature. 10/8 - diet continues to improve, initiate hydroxyzine PRN 10/9 -patient continues to progress, remains impulsive, difficult to reorient but able to ambulate somewhat today with physical therapy, recommendations pending but certainly approaching safe disposition to facility.  Unsafe discharge home alone or with family given need for 24-hour care and monitoring given his current mental status. 10/10 Patient now being evaluated for CIR placement - not appropriate for this level of care per CIR - (remains very impulsive and requires 24h monitoring but more redirectable as of late). PO intake markedly improving - no indication for PEG tube at this time (ate 100% of breakfast/lunch with minimal assistance).     Subjective/Interval History: Patient denies any symptoms per se.  Wants to get better.  Wants to participate rehabilitation.    Assessment/Plan:  Goals of care  Previous providers have had lengthy conversation with the patient's daughter. Family expectations is that patient will continue to recover back to previous baseline.  This was felt to be unlikely.  -Prior disposition was somewhat limited by NG tube placement with prior concern for possible PEG tube placement, now that dietary intake is markedly improving there is no further need to pursue PEG placement.   Acute toxic metabolic encephalopathy in the setting of ischemic stroke, stable: -In the setting of acute stroke/toxic encephalopathy versus possible previous anoxic event as patient was found down in the  field of unclear etiology and duration -with cocaine positive UDS Seen by neurology.  Patient was given high-dose thiamine.  Now  on once a day thiamine. -Per neurology high suspicion for anoxic brain injury versus toxin induced encephalopathy and ischemic strokes given negative EEG. -Continue to limit patient's CNS depressing medications, certainly avoid benzodiazepines given patient's history - Continue hydroxyzine PRN -Patient continues to become agitated likely secondary to ongoing confusion and difficulty with orientation, difficulty with word finding, poorly redirectable and his mood is quite labile, somewhat histrionic at times without clear provocation or inciting events. Mood stabilizing drugs could be used.  Could start him on low-dose Seroquel every night and see how he responds.   Multifocal cerebral infarcts: -Neurology recommended to start aspirin once patient is off of anticoagulation (presumed DVT) -High risk for falls, discussed fall risk with family, safe disposition is paramount as below   Acute hypoxic respiratory failure secondary to aspiration pneumonia, resolved Right-sided aspiration pneumonia with parapneumonic effusion: -Status post chest tube placement with intrapleural lytics on 9/14, chest tube removed 9/16. -Completed 7 days of Zosyn -Intubated on admission outside hospital 9/6. -Extubated 9/17. -Currently on Room air.    Superficial vein thrombosis 5/03 Right basilic vein thrombus -Due to increase risk for progression of thrombosis patient was started on Lovenox per previous discussion with oncology (Dr. Alen Blew). Since his oral intake is improved and he is safe to swallow medicines we will transition him to apixaban.  Plan for 3 months of treatment which will be until December 11.   Dysphagia/nutrition:  Initially was found to be with significant oropharyngeal dysphagia.  Required tube feedings.  PEG was being considered however then his oral intake started improving.  No indication for PEG tube at this time.   Thrombocytosis: Resolved   Mild liver dysfunction/abnormal LFTs History  of hepatitis C, improved.   Diarrhea, resolved:   Severe protein calorie malnutrition Nutrition Problem: Severe Malnutrition Etiology: social / environmental circumstances (drug abuse)  Signs/Symptoms: severe fat depletion, severe muscle depletion  Interventions: Tube feeding  DVT prophylaxis:   Apixaban Code Status: Full Code  Family Communication: No family at bedside this morning.   Disposition: Not a candidate for CIR.  SNF being pursued.  Continues to require sitter.   Status is: Inpatient Remains inpatient appropriate because: Needs placement.     Medications: Scheduled:  amLODipine  5 mg Oral Daily   apixaban  5 mg Oral BID   feeding supplement  237 mL Oral TID BM   influenza vac split quadrivalent PF  0.5 mL Intramuscular Tomorrow-1000   lidocaine  1 patch Transdermal Q24H   melatonin  5 mg Oral QHS   pantoprazole  40 mg Oral Daily   pneumococcal 20-valent conjugate vaccine  0.5 mL Intramuscular Tomorrow-1000   thiamine  100 mg Oral Daily   Continuous: UUE:[id number], calcium carbonate, docusate sodium, hydrOXYzine, ondansetron (ZOFRAN) IV, mouth rinse, polyethylene glycol  Antibiotics: Anti-infectives (From admission, onward)    Start     Dose/Rate Route Frequency Ordered Stop   10/23/21 1000  cefTRIAXone (ROCEPHIN) 2 g in sodium chloride 0.9 % 100 mL IVPB  Status:  Discontinued        2 g 200 mL/hr over 30 Minutes Intravenous Every 24 hours 10/22/21 0036 10/22/21 0037   10/23/21 1000  cefTRIAXone (ROCEPHIN) 2 g in sodium chloride 0.9 % 100 mL IVPB  Status:  Discontinued        2 g 200 mL/hr over 30 Minutes Intravenous Every 24 hours 10/22/21  0037 10/22/21 0827   10/22/21 0930  piperacillin-tazobactam (ZOSYN) IVPB 3.375 g        3.375 g 12.5 mL/hr over 240 Minutes Intravenous Every 8 hours 10/22/21 0833 10/29/21 0559   10/22/21 0915  ceFEPIme (MAXIPIME) 2 g in sodium chloride 0.9 % 100 mL IVPB  Status:  Discontinued        2 g 200 mL/hr over 30 Minutes  Intravenous Every 8 hours 10/22/21 0827 10/22/21 0833       Objective:  Vital Signs  Vitals:   11/18/21 0317 11/18/21 0454 11/18/21 0456 11/18/21 0700  BP: (!) 130/99 126/86  123/84  Pulse: 89 84  81  Resp: 18 20  17   Temp: 97.6 F (36.4 C) 97.8 F (36.6 C)  97.7 F (36.5 C)  TempSrc:  Oral  Oral  SpO2: 100% 99%  97%  Weight:   70.6 kg   Height:        Intake/Output Summary (Last 24 hours) at 11/18/2021 1116 Last data filed at 11/18/2021 0430 Gross per 24 hour  Intake 1038 ml  Output --  Net 1038 ml   Filed Weights   11/12/21 0500 11/13/21 0500 11/18/21 0456  Weight: 70.6 kg 71.2 kg 70.6 kg    General appearance: Awake alert.  In no distress.  Noted to be impulsive Resp: Clear to auscultation bilaterally.  Normal effort Cardio: S1-S2 is normal regular.  No S3-S4.  No rubs murmurs or bruit GI: Abdomen is soft.  Nontender nondistended.  Bowel sounds are present normal.  No masses organomegaly   Lab Results:  Data Reviewed: I have personally reviewed following labs and reports of the imaging studies  CBC: Recent Labs  Lab 11/13/21 0439  WBC 8.3  HGB 13.8  HCT 40.7  MCV 93.1  PLT 237    Basic Metabolic Panel: Recent Labs  Lab 11/13/21 0439  NA 137  K 3.7  CL 101  CO2 26  GLUCOSE 102*  BUN 19  CREATININE 0.74  CALCIUM 10.0    GFR: Estimated Creatinine Clearance: 101.7 mL/min (by C-G formula based on SCr of 0.74 mg/dL).   Recent Labs  Lab 11/12/21 0505 11/13/21 0439 11/14/21 0340 11/15/21 0638 11/17/21 0446  AMMONIA 84* 18 25 32 22      CBG: Recent Labs  Lab 11/11/21 1609 11/11/21 2013 11/12/21 0028 11/12/21 0502 11/12/21 0731  GLUCAP 108* 98 101* 105* 114*     Radiology Studies: No results found.     LOS: 28 days   Quanna Wittke 01/12/22 on [email or web address]  11/18/2021, 11:16 AM

## 2021-11-19 DIAGNOSIS — G9340 Encephalopathy, unspecified: Secondary | ICD-10-CM | POA: Diagnosis not present

## 2021-11-19 DIAGNOSIS — I639 Cerebral infarction, unspecified: Secondary | ICD-10-CM | POA: Diagnosis not present

## 2021-11-19 LAB — FUNGUS CULTURE RESULT

## 2021-11-19 LAB — FUNGUS CULTURE WITH STAIN

## 2021-11-19 LAB — FUNGAL ORGANISM REFLEX

## 2021-11-19 MED ORDER — QUETIAPINE FUMARATE 50 MG PO TABS
50.0000 mg | ORAL_TABLET | Freq: Every day | ORAL | Status: DC
  Administered 2021-11-19 – 2021-11-20 (×2): 50 mg via ORAL
  Filled 2021-11-19 (×2): qty 1

## 2021-11-19 MED ORDER — HALOPERIDOL LACTATE 5 MG/ML IJ SOLN
0.5000 mg | Freq: Once | INTRAMUSCULAR | Status: DC
  Filled 2021-11-19: qty 1

## 2021-11-19 MED ORDER — HALOPERIDOL 0.5 MG PO TABS
0.5000 mg | ORAL_TABLET | Freq: Once | ORAL | Status: AC
  Administered 2021-11-19: 0.5 mg via ORAL
  Filled 2021-11-19: qty 1

## 2021-11-19 NOTE — Progress Notes (Signed)
Occupational Therapy Treatment Patient Details Name: [name] MRN: [id number] DOB: [birth year] Today's Date: 11/19/2021   History of present illness Pt is 56 yo male admitted on to [facility] in [place] on 9/6 after being found unresponsive.  Pt transferred to [facility] on 10/21/21.  Pt with acute toxic metabolic encephalopathy vs encephalitis in pt with acute ischemic strokes per neurology.  9/11 R UE DVT started on lovenox, 9/14-9/16 chest tube, ETT 9/6-9/18.  Hx of polysubstance abuse, hep C, and GERD.   OT comments  Pt progressing towards established OT goals. Performing functional mobility with min guard A and brushing hair in standing with min guard A. Pt continues to benefit from built up grip for self feeding at this time and presents with mod spillage. Pt reporting decresaed visual acuity throughout session, and unable to read room numbers. Pt presenting with decreased ability to follow 2+ step commands, decreased strength and coordination in RUE, decreased balance, cognition, memory, and strength. Pt is not a candidate for CIR due to decreased level of support at home. Updated discharge recommendation accordingly.    Recommendations for follow up therapy are one component of a multi-disciplinary discharge planning process, led by the attending physician.  Recommendations may be updated based on patient status, additional functional criteria and insurance authorization.    Follow Up Recommendations  Skilled nursing-short term rehab (<3 hours/day)    Assistance Recommended at Discharge    Patient can return home with the following  A little help with walking and/or transfers;A little help with bathing/dressing/bathroom;Assistance with cooking/housework;Direct supervision/assist for medications management;Direct supervision/assist for financial management;Help with stairs or ramp for entrance;Assist for transportation   Equipment Recommendations  Other (comment) (defer to next venue)     Recommendations for Other Services      Precautions / Restrictions Precautions Precautions: Fall Precaution Comments: impulsive Restrictions Weight Bearing Restrictions: No       Mobility Bed Mobility Overal bed mobility: Needs Assistance Bed Mobility: Supine to Sit, Sit to Supine Rolling: Supervision   Supine to sit: Supervision     General bed mobility comments: supervision for safety. Poor safety awareness.    Transfers Overall transfer level: Needs assistance Equipment used: None Transfers: Sit to/from Stand Sit to Stand: Min guard           General transfer comment: Min guard A for safety     Balance Overall balance assessment: Needs assistance Sitting-balance support: No upper extremity supported, Feet supported Sitting balance-Leahy Scale: Fair Sitting balance - Comments: supervision sitting EOB on arrival from sitter. OT providing supervision throughout session while sitting due to pt impulsive   Standing balance support: No upper extremity supported, During functional activity Standing balance-Leahy Scale: Fair Standing balance comment: min guard A                           ADL either performed or assessed with clinical judgement   ADL Overall ADL's : Needs assistance/impaired Eating/Feeding: Sitting;With adaptive utensils;Set up;Supervision/ safety Eating/Feeding Details (indicate cue type and reason): Eating on arrival. Self feeding with mod spillage. Grooming: Brushing hair;Min guard;Standing Grooming Details (indicate cue type and reason): Min guard A to perform with LUE. Unable to with RUE due to decresed shoulder flexion/strength to lift arm                 Toilet Transfer: Loss adjuster, chartered Details (indicate cue type and reason): simulated in room Toileting- Water quality scientist and Hygiene: [place]  guard Toileting - Clothing Manipulation Details (indicate cue type and reason): min guard A for safety. Pt  urinating at beginning and end of session while in standing. Self management of pants provided by nursing staff.     Functional mobility during ADLs: Min guard General ADL Comments: Min guard for safety during functional mobility. Intermittent crossing of LLE over RLE during gait with minor LOB but no physical assist required to correct    Extremity/Trunk Assessment Upper Extremity Assessment Upper Extremity Assessment: RUE deficits/detail RUE Deficits / Details: decreased grasp strength, decreased Forward flexion, "It won't move like its supposed to" RUE Sensation: decreased proprioception RUE Coordination: decreased fine motor   Lower Extremity Assessment Lower Extremity Assessment: Defer to PT evaluation        Vision   Vision Assessment?: Vision impaired- to be further tested in functional context Additional Comments: Reports blurry vision with close and distance. Reports MD supposed to be ordering glasses. Unsure of accuracy of pt report. Unable to read room numbers outside of pt rooms or directional signs. Was observed to loctate trash can in hallway to throw away water cup.   Perception     Praxis Praxis Praxis: Impaired Praxis Impairment Details: Ideation;Initiation;Ideomotor;Motor planning;Perseveration    Cognition Arousal/Alertness: Awake/alert Behavior During Therapy: Impulsive, Restless Overall Cognitive Status: Impaired/Different from baseline Area of Impairment: Orientation, Attention, Following commands, Safety/judgement, Awareness, Problem solving                 Orientation Level: Disoriented to, Place, Time, Situation (Pt oriented to self. Oriented to day of the week, but not place or situation)     Following Commands: Follows one step commands inconsistently, Follows one step commands with increased time Safety/Judgement: Decreased awareness of safety, Decreased awareness of deficits Awareness: Emergent (aware that self-feeding is difficult) Problem  Solving: Difficulty sequencing, Requires verbal cues, Requires tactile cues General Comments: Pt hugging OT frequently thanking her for walking and helping with RUE. Pt with tangential thought processes throughout session, and with max difficulty with delayed memory recall and following multistep commands. Pt following one step commands with incresaed time.        Exercises Exercises: Other exercises Other Exercises Other Exercises: finger opposition LUE, RUE 5x each Other Exercises: shoulder flexion 4x RUE Other Exercises: elbow flexion 5x RUE    Shoulder Instructions       General Comments VSS. Pt pleasant and conversational. Intermittently labile.    Pertinent Vitals/ Pain       Pain Assessment Pain Assessment: No/denies pain Pain Intervention(s): Monitored during session  Home Living                                          Prior Functioning/Environment              Frequency  Min 2X/week        Progress Toward Goals  OT Goals(current goals can now be found in the care plan section)  Progress towards OT goals: Progressing toward goals  Acute Rehab OT Goals Patient Stated Goal: Get R arm to work OT Goal Formulation: With patient Time For Goal Achievement: 11/26/21 Potential to Achieve Goals: [place] Discharge plan needs to be updated    Co-evaluation                 AM-PAC OT "6 Clicks" Daily Activity     Outcome Measure  Help from another person eating meals?: A Little Help from another person taking care of personal grooming?: A Little Help from another person toileting, which includes using toliet, bedpan, or urinal?: A Little Help from another person bathing (including washing, rinsing, drying)?: A Little Help from another person to put on and taking off regular upper body clothing?: A Little Help from another person to put on and taking off regular lower body clothing?: A Lot 6 Click Score: 17    End of Session     OT Visit Diagnosis: Unsteadiness on feet (R26.81);Other abnormalities of gait and mobility (R26.89);Other symptoms and signs involving cognitive function;Muscle weakness (generalized) (M62.81);Low vision, both eyes (H54.2)   Activity Tolerance Patient tolerated treatment well   Patient Left in bed;with call bell/phone within reach;with bed alarm set   Nurse Communication Mobility status (sitter left; bed alarm on)        Time: 1703-1730 OT Time Calculation (min): 27 min  Charges: OT General Charges $OT Visit: 1 Visit OT Treatments $Self Care/Home Management : 8-22 mins $Therapeutic Exercise: 8-22 mins  Shanda Howells, OTR/L [facility] Acute Rehabilitation Office: [phone]   Lula Olszewski 11/19/2021, 5:42 PM

## 2021-11-19 NOTE — Progress Notes (Signed)
Mobility Specialist: Progress Note   11/19/21 1417  Mobility  Activity Ambulated with assistance in hallway  Level of Assistance Contact guard assist, steadying assist  Assistive Device None  Distance Ambulated (ft) 350 ft  Activity Response Tolerated well  $Mobility charge 1 Mobility   Received pt in bed having no complaints and agreeable to mobility. Pt was asymptomatic throughout ambulation and returned to room w/o fault. Left pt standing at the sink w/ NT present in the room.    [place] Mobility Specialist Secure Chat Only

## 2021-11-19 NOTE — Progress Notes (Signed)
Nutrition Follow-up  DOCUMENTATION CODES:  Severe malnutrition in context of social or environmental circumstances  INTERVENTION:  Continue current diet as recommend per SLP Feeding assistance Ensure Enlive po TID, each supplement provides 350 kcal and 20 grams of protein. Mighty shakes TID to provide 330kcal and 9g of protein  NUTRITION DIAGNOSIS:  Severe Malnutrition related to social / environmental circumstances (drug abuse) as evidenced by severe fat depletion, severe muscle depletion. - remains applicable  GOAL:  Patient will meet greater than or equal to 90% of their needs - progressing  MONITOR:  TF tolerance, Diet advancement, Labs  REASON FOR ASSESSMENT:  Consult Assessment of nutrition requirement/status (Patient with Diarrhea, should we change formula?)  ASSESSMENT:   56 yo male admitted to [facility] after being found down. Transferred to [facility] with concerns for anoxic brain injury (CT showed bilateral cerebellar edema). PMH includes polysubstance abuse, hepatitis C, GERD, smoker.  9/13 - admitted from outside facility intubated 9/14 - TF initiated 9/18 - extubated, SLP eval, recommended NPO 9/19 - cortrak placed 9/27 - MBS, advanced to DYS3/Thins, IR evaluated and found anatomy is amenable to g-tube if needed 9/29 - TF were adjusted to nocturnal 10/5 - Pt pulled cortrak  Pt resting in bed at time of assessment, unable to provide a nutrition hx. Reviewed intake and it does appear that PO is improving, but pt likely not meeting needs through meals alone. Intermittently refusing ensures. Weight is stable x about 2 weeks. Will continue to monitor.   Admit weight: 80.9 kg Current Weight: 70.9 kg  Average Meal Intake: 9/27-9/29: 23% average intake x 4 recorded meals 9/30-10/3: 20% intake x 8 recorded meals 10/4-10/5: 29% intake x 5 recorded meals 10/6-10/12: 48% x 11 recorded meals  No intake or output data in the 24 hours ending 11/19/21 1609 Net IO Since  Admission: -6,853.06 mL [11/19/21 1609]  Nutritionally Relevant Medications: Scheduled Meds:  Ensure Plus High Protein   237 mL Oral TID BM   pantoprazole  40 mg Oral Q1200   thiamine  100 mg Oral Daily   Labs Reviewed: CBG ranges from 112-161mg /dL over the last 24 hours  NUTRITION - FOCUSED PHYSICAL EXAM: Flowsheet Row Most Recent Value  Orbital Region Mild depletion  Upper Arm Region Severe depletion  Thoracic and Lumbar Region Severe depletion  Buccal Region Mild depletion  Temple Region Mild depletion  Clavicle Bone Region Severe depletion  Clavicle and Acromion Bone Region Severe depletion  Scapular Bone Region Mild depletion  Dorsal Hand Severe depletion  Patellar Region Severe depletion  Anterior Thigh Region Severe depletion  Posterior Calf Region Severe depletion  Edema (RD Assessment) None  Hair Reviewed  [tangled, unkept]  Eyes Reviewed  Mouth Reviewed  Skin Reviewed  Nails Reviewed   Diet Order:   Diet Order             Diet regular Room service appropriate? No; Fluid consistency: Thin  Diet effective now                   EDUCATION NEEDS:  Not appropriate for education at this time  Skin:  Skin Assessment: Reviewed RN Assessment  Last BM:  10/9  Height:  Ht Readings from Last 1 Encounters:  10/21/21 5\' 10"  (1.778 m)    Weight:  Wt Readings from Last 1 Encounters:  11/19/21 70.9 kg    Ideal Body Weight:  75.5 kg  BMI:  Body mass index is 22.43 kg/m.  Estimated Nutritional Needs:  Kcal:  2200-2400 Protein:  110-120 gm Fluid:  2.2-2.4 L   Ranell Patrick, RD, LDN Clinical Dietitian RD pager # available in [place]  After hours/weekend pager # available in [facility]

## 2021-11-19 NOTE — Progress Notes (Signed)
[facility] PROGRESS NOTE   [name] [id number] DOB: 11-22-1965 DOA: 10/21/2021  PCP: [name], MD  Brief History/Interval Summary: 56 year old WM PMHx Polysubstance abuse, Hepatitis C,    Presented to [facility] in [place] on 9/6 after being found down.  He was last known well at approximately 13:00 hours that day however EMS was called for wellness check at some time later on.  He was found to be down and unresponsive.  On EMS evaluation the patient was apneic with thready pulse.  He did not responded to intranasal Narcan.  He was bagged in route to the emergency department and subsequently intubated on arrival to the ED. CT head demonstrated cerebral edema without loss of white matter differentiation. He was transferred to [facility] for MRI. On 9/11 he was noted to have upper extremity edema on the right. Ultrasound demonstrated acute thrombosis in the mid and distal basilic vein. Hospital course also complicated by aspiration pneumonia. Subsequently transferred to [facility] on 9/13.   Events:  9/6 found down > present to [facility] ED. CT with bilateral cerebellar edema. 9/7 repeat CT normal Unable to be weaned from sedation 9/11 RUE DVT started on tx dose lovenox 9/13 transfer to [facility] for MRI 9/14 chest tube placed with intrapleural fibrinolytic administration 9/16 chest tube removed 9/17 patient extubated, remained agitated and started on precedex 9/19 Cortrak placed  10/4 Attempting to wean cortrak (increase PO intake more liberally over the next 24h per SLP recs) - once cortrak removed can start having reasonable discussion on disposition.  10/5 Cortrak accidentally removed - advancing diet as tolerated 10/6 - overnight agitation - haldol x1 given - continues to complain of "having a panic attack" but then describes reflux symptoms. 10/7-patient continues to have word finding difficulty, received benzos overnight as patient reportedly requested  outpatient Valium which she has not been on and does not currently take.  We will attempt to limit CNS depressing medications given his ongoing mental status changes from previous baseline secondary to below. Palliative will follow along from a distance which is certainly reasonable given his stable nature. 10/8 - diet continues to improve, initiate hydroxyzine PRN 10/9 -patient continues to progress, remains impulsive, difficult to reorient but able to ambulate somewhat today with physical therapy, recommendations pending but certainly approaching safe disposition to facility.  Unsafe discharge home alone or with family given need for 24-hour care and monitoring given his current mental status. 10/10 Patient now being evaluated for CIR placement - not appropriate for this level of care per CIR - (remains very impulsive and requires 24h monitoring but more redirectable as of late). PO intake markedly improving - no indication for PEG tube at this time (ate 100% of breakfast/lunch with minimal assistance).     Subjective/Interval History: Patient noted to be walking around in his room.  There is a Actuary at bedside.  Patient mentioned that he did not sleep much last night.  Denies any pain issues.  No nausea vomiting.  Still noted to be quite impulsive and emotional.      Assessment/Plan:  Goals of care  Previous providers have had lengthy conversation with the patient's daughter. Family expectations is that patient will continue to recover back to previous baseline.  This was felt to be unlikely.  -Prior disposition was somewhat limited by NG tube placement with prior concern for possible PEG tube placement, now that dietary intake is markedly improving there is no further need to pursue PEG placement.  Acute toxic metabolic encephalopathy in the setting of ischemic stroke, stable: -In the setting of acute stroke/toxic encephalopathy versus possible previous anoxic event as patient was found  down in the field of unclear etiology and duration -with cocaine positive UDS Seen by neurology.  Patient was given high-dose thiamine.  Now on once a day thiamine. -Per neurology high suspicion for anoxic brain injury versus toxin induced encephalopathy and ischemic strokes given negative EEG. -Avoid benzodiazepine as much as possible - Continue hydroxyzine PRN -Patient continues to become agitated likely secondary to ongoing confusion and difficulty with orientation, difficulty with word finding, poorly redirectable and his mood is quite labile, somewhat histrionic at times without clear provocation or inciting events. Patient was started on Seroquel last night.  Will increase the dose tonight.   Multifocal cerebral infarcts: -Neurology recommended to start aspirin once patient is off of anticoagulation (presumed DVT) -High risk for falls, discussed fall risk with family, safe disposition is paramount as below   Acute hypoxic respiratory failure secondary to aspiration pneumonia, resolved Right-sided aspiration pneumonia with parapneumonic effusion: -Status post chest tube placement with intrapleural lytics on 9/14, chest tube removed 9/16. -Completed 7 days of Zosyn -Intubated on admission outside hospital 9/6. -Extubated 9/17. -Currently on Room air.    Superficial vein thrombosis 9/11 Right basilic vein thrombus -Due to increase risk for progression of thrombosis patient was started on Lovenox per previous discussion with oncology (Dr. Clelia Croft). Patient has been transitioned to apixaban. Plan for 3 months of treatment which will be until December 11.   Dysphagia/nutrition:  Initially was found to be with significant oropharyngeal dysphagia.  Required tube feedings.  PEG was being considered however then his oral intake started improving.  No indication for PEG tube at this time.   Thrombocytosis: Resolved   Mild liver dysfunction/abnormal LFTs History of hepatitis C, improved.    Diarrhea, resolved:   Severe protein calorie malnutrition Nutrition Problem: Severe Malnutrition Etiology: social / environmental circumstances (drug abuse) Signs/Symptoms: severe fat depletion, severe muscle depletion  DVT prophylaxis:   Apixaban Code Status: Full Code  Family Communication: No family at bedside this morning.   Disposition: Not a candidate for CIR.  SNF being pursued.  Continues to require sitter.   Status is: Inpatient Remains inpatient appropriate because: Needs placement.     Medications: Scheduled:  amLODipine  5 mg Oral Daily   apixaban  5 mg Oral BID   feeding supplement  237 mL Oral TID BM   lidocaine  1 patch Transdermal Q24H   melatonin  5 mg Oral QHS   pantoprazole  40 mg Oral Q1200   QUEtiapine  50 mg Oral QHS   thiamine  100 mg Oral Daily   Continuous: HUD:[id number], calcium carbonate, docusate sodium, hydrOXYzine, ondansetron (ZOFRAN) IV, mouth rinse, polyethylene glycol  Antibiotics: Anti-infectives (From admission, onward)    Start     Dose/Rate Route Frequency Ordered Stop   10/23/21 1000  cefTRIAXone (ROCEPHIN) 2 g in sodium chloride 0.9 % 100 mL IVPB  Status:  Discontinued        2 g 200 mL/hr over 30 Minutes Intravenous Every 24 hours 10/22/21 0036 10/22/21 0037   10/23/21 1000  cefTRIAXone (ROCEPHIN) 2 g in sodium chloride 0.9 % 100 mL IVPB  Status:  Discontinued        2 g 200 mL/hr over 30 Minutes Intravenous Every 24 hours 10/22/21 0037 10/22/21 0827   10/22/21 0930  piperacillin-tazobactam (ZOSYN) IVPB 3.375 g  3.375 g 12.5 mL/hr over 240 Minutes Intravenous Every 8 hours 10/22/21 0833 10/29/21 0559   10/22/21 0915  ceFEPIme (MAXIPIME) 2 g in sodium chloride 0.9 % 100 mL IVPB  Status:  Discontinued        2 g 200 mL/hr over 30 Minutes Intravenous Every 8 hours 10/22/21 0827 10/22/21 0833       Objective:  Vital Signs  Vitals:   11/18/21 2309 11/19/21 0419 11/19/21 0500 11/19/21 0706  BP: 122/73 105/66   112/86  Pulse: 83 90  95  Resp: 16 17  18   Temp: 97.8 F (36.6 C) 97.6 F (36.4 C)  (!) 97.4 F (36.3 C)  TempSrc: Oral   Oral  SpO2: 98% 98%  99%  Weight:   70.9 kg   Height:        Intake/Output Summary (Last 24 hours) at 11/19/2021 1000 Last data filed at 11/18/2021 1200 Gross per 24 hour  Intake 240 ml  Output --  Net 240 ml    Filed Weights   11/13/21 0500 11/18/21 0456 11/19/21 0500  Weight: 71.2 kg 70.6 kg 70.9 kg    General appearance: Awake alert.  In no distress.  Noted to be anxious. Resp: Clear to auscultation bilaterally.  Normal effort Cardio: S1-S2 is normal regular.  No S3-S4.  No rubs murmurs or bruit GI: Abdomen is soft.  Nontender nondistended.  Bowel sounds are present normal.  No masses organomegaly Extremities: No edema.  Full range of motion of lower extremities. No obvious focal neurological deficits.   Lab Results:  Data Reviewed: I have personally reviewed following labs and reports of the imaging studies  CBC: Recent Labs  Lab 11/13/21 0439  WBC 8.3  HGB 13.8  HCT 40.7  MCV 93.1  PLT 237     Basic Metabolic Panel: Recent Labs  Lab 11/13/21 0439  NA 137  K 3.7  CL 101  CO2 26  GLUCOSE 102*  BUN 19  CREATININE 0.74  CALCIUM 10.0     GFR: Estimated Creatinine Clearance: 102.2 mL/min (by C-G formula based on SCr of 0.74 mg/dL).   Recent Labs  Lab 11/13/21 0439 11/14/21 0340 11/15/21 0638 11/17/21 0446  AMMONIA 18 25 32 22       Radiology Studies: No results found.     LOS: 29 days   Johan Creveling 01/17/22 on [email or web address]  11/19/2021, 10:00 AM

## 2021-11-20 DIAGNOSIS — G9340 Encephalopathy, unspecified: Secondary | ICD-10-CM | POA: Diagnosis not present

## 2021-11-20 DIAGNOSIS — I639 Cerebral infarction, unspecified: Secondary | ICD-10-CM | POA: Diagnosis not present

## 2021-11-20 LAB — CBC
HCT: 37.1 % — ABNORMAL LOW (ref 39.0–52.0)
Hemoglobin: 12.7 g/dL — ABNORMAL LOW (ref 13.0–17.0)
MCH: 32 pg (ref 26.0–34.0)
MCHC: 34.2 g/dL (ref 30.0–36.0)
MCV: 93.5 fL (ref 80.0–100.0)
Platelets: 253 10*3/uL (ref 150–400)
RBC: 3.97 MIL/uL — ABNORMAL LOW (ref 4.22–5.81)
RDW: 13.2 % (ref 11.5–15.5)
WBC: 10 10*3/uL (ref 4.0–10.5)
nRBC: 0 % (ref 0.0–0.2)

## 2021-11-20 LAB — COMPREHENSIVE METABOLIC PANEL
ALT: 50 U/L — ABNORMAL HIGH (ref 0–44)
AST: 18 U/L (ref 15–41)
Albumin: 3.3 g/dL — ABNORMAL LOW (ref 3.5–5.0)
Alkaline Phosphatase: 82 U/L (ref 38–126)
Anion gap: 8 (ref 5–15)
BUN: 15 mg/dL (ref 6–20)
CO2: 25 mmol/L (ref 22–32)
Calcium: 9.5 mg/dL (ref 8.9–10.3)
Chloride: 106 mmol/L (ref 98–111)
Creatinine, Ser: 0.81 mg/dL (ref 0.61–1.24)
GFR, Estimated: >60 mL/min (ref >60)
Glucose, Bld: 102 mg/dL — ABNORMAL HIGH (ref 70–99)
Potassium: 4.1 mmol/L (ref 3.5–5.1)
Sodium: 139 mmol/L (ref 135–145)
Total Bilirubin: 0.4 mg/dL (ref 0.3–1.2)
Total Protein: 6.2 g/dL — ABNORMAL LOW (ref 6.5–8.1)

## 2021-11-20 LAB — MAGNESIUM: Magnesium: 2 mg/dL (ref 1.7–2.4)

## 2021-11-20 MED ORDER — HALOPERIDOL 0.5 MG PO TABS
0.5000 mg | ORAL_TABLET | Freq: Once | ORAL | Status: AC
  Administered 2021-11-20: 0.5 mg via ORAL
  Filled 2021-11-20: qty 1

## 2021-11-20 NOTE — Plan of Care (Signed)

## 2021-11-20 NOTE — Progress Notes (Addendum)
Speech Language Pathology Treatment: Cognitive-Linquistic  Patient Details Name: [name] MRN: [id number] DOB: July 19, 1965 Today's Date: 11/20/2021 Time: [id number] SLP Time Calculation (min) (ACUTE ONLY): 33 min  Assessment / Plan / Recommendation Clinical Impression  Pt demonstrates ongoing improvement in attention and problem solving though his attention wanders and can be very impulsive. SLP followed pts lead around room cueing as needed in his own interests and activities. He is not tearful this session and more able to participate in simple therapeutic naming tasks. Pt able to name 5/5 familiar objects on tray, but in conversation pts word finding impairment is apparent. Pt often tries to tell a story, hesitates on a word, then gets frustrated or distracted. There is frequent circumlocution with nouns, or use of nonspecific/general terms. Stories often seem confabulated or a mix up of his own poor perception of reality with incorrect names and substitutions. For example pt keeps telling a story about a Speech therapist that cut off his pants (which did not happen). Pt is able to respond to corrections and questions to reorganize thoughts. Pt shows increasing awareness with basic functional tasks like brushing teeth with occasional verbal cues needed to notice the still running water etc. Pt requires moderate to max verbal and tactile cues in sequencing task (putting on his pants). Will continue efforts. Pt would benefit from transition to a rehabilitation setting at this point for increased access to activity.   HPI HPI: 56 year old male with past medical history significant for polysubstance abuse, hepatitis C, and GERD who presented to [facility] in [place] on 9/6 after being found down. He was bagged in route to the emergency department at which he arrived around 2300 hrs.  He was intubated immediately upon arrival to the ED 9/6.  CT of the head demonstrated cerebral edema without  loss of white matter differentiation.  MRI shows Multifocal, primarily small acute infarcts involving bilateral cerebral hemispheres.. Extubated 9/18.      SLP Plan  Continue with current plan of care      Recommendations for follow up therapy are one component of a multi-disciplinary discharge planning process, led by the attending physician.  Recommendations may be updated based on patient status, additional functional criteria and insurance authorization.    Recommendations   SNF                Plan: Continue with current plan of care           Lemon Sternberg, Katherene Ponto  11/20/2021, 9:36 AM

## 2021-11-20 NOTE — Progress Notes (Signed)
Physical Therapy Treatment Patient Details Name: [name] MRN: [id number] DOB: 13-Oct-1965 Today's Date: 11/20/2021   History of Present Illness Pt is 56 yo male admitted on to [facility] in [place] on 9/6 after being found unresponsive.  Pt transferred to [facility] on 10/21/21.  Pt with acute toxic metabolic encephalopathy vs encephalitis in pt with acute ischemic strokes per neurology.  9/11 R UE DVT started on lovenox, 9/14-9/16 chest tube, ETT 9/6-9/18.  Hx of polysubstance abuse, hep C, and GERD.    PT Comments    Pt up with sitter in room upon PT arrival to room, pleasant and agreeable to hallway mobility. Pt mobilizing at close guard to light assist level at this time, cognition remains a limiting factor for pt. Pt given cognitive tasks to attend to during gait (directional cues, finding objects in hallway, finding his way back to his room), pt growing increasingly frustrated with questions/tasks and feels as if this PT is trying to confuse him when PT would not let him enter into another pt's room, pt tried x5 with difficulty redirecting. Session terminated given pt's increasing frustration and pt expressing need to have BM. PT to continue to follow, plan remains appropriate and continues to need 24/7 for cognition post-acutely.     Recommendations for follow up therapy are one component of a multi-disciplinary discharge planning process, led by the attending physician.  Recommendations may be updated based on patient status, additional functional criteria and insurance authorization.  Follow Up Recommendations  Skilled nursing-short term rehab (<3 hours/day) Can patient physically be transported by private vehicle: No   Assistance Recommended at Discharge Frequent or constant Supervision/Assistance  Patient can return home with the following Assistance with cooking/housework;Assistance with feeding;Help with stairs or ramp for entrance;A little help with walking and/or transfers;A  little help with bathing/dressing/bathroom;Direct supervision/assist for medications management;Direct supervision/assist for financial management;Assist for transportation   Equipment Recommendations  Other (comment) (defer)    Recommendations for Other Services       Precautions / Restrictions Precautions Precautions: Fall Precaution Comments: impulsive Restrictions Weight Bearing Restrictions: No     Mobility  Bed Mobility Overal bed mobility: Needs Assistance             General bed mobility comments: OOB at sink    Transfers Overall transfer level: Needs assistance Equipment used: None Transfers: Sit to/from Stand Sit to Stand: Min guard           General transfer comment: Min guard A for safety    Ambulation/Gait Ambulation/Gait assistance: Min guard, Min assist Gait Distance (Feet): 200 Feet Assistive device: None Gait Pattern/deviations: Step-through pattern, Decreased stride length, Staggering left, Staggering right, Narrow base of support Gait velocity: decr     General Gait Details: close guard for safety, occasional steadying assist especially when pt is visually distracted   Stairs             Wheelchair Mobility    Modified Rankin (Stroke Patients Only) Modified Rankin (Stroke Patients Only) Pre-Morbid Rankin Score: No symptoms Modified Rankin: Moderately severe disability     Balance Overall balance assessment: Needs assistance Sitting-balance support: No upper extremity supported, Feet supported Sitting balance-Leahy Scale: Fair     Standing balance support: No upper extremity supported, During functional activity Standing balance-Leahy Scale: Fair Standing balance comment: min guard A                            Cognition Arousal/Alertness: Awake/alert  Behavior During Therapy: Impulsive, Restless Overall Cognitive Status: Impaired/Different from baseline Area of Impairment: Orientation, Attention, Following  commands, Safety/judgement, Awareness, Problem solving                 Orientation Level: Disoriented to, Place, Time, Situation Current Attention Level: Sustained, Focused   Following Commands: Follows one step commands inconsistently, Follows one step commands with increased time Safety/Judgement: Decreased awareness of safety, Decreased awareness of deficits Awareness: Intellectual Problem Solving: Difficulty sequencing, Requires verbal cues, Requires tactile cues General Comments: Pt mostly with focused attention, periods of sustained when given tasks to attend to in the hallway (find a nurse in blue scrubs, find 3 red objects). Pt increasingly frustated with cognitive questions during hallway mobility, pt thinks that PT is attenpting to confuse him on which room is his but describes it as "I hate those people down there(pointing down the hallway), always talking and asking me things and confusing me" when he was referring to this PT. Pt passes his room multiple times and requires multimodal cuing to enter room, attempts to enter neighbor's room x5. Pt with navigational issues in general, PT cues pt to turn R, he turns L then cannot figure out how to get down hallway PT is pointing to twoards the R        Exercises      General Comments        Pertinent Vitals/Pain Pain Assessment Pain Assessment: Faces Faces Pain Scale: No hurt Pain Intervention(s): Monitored during session    Home Living                          Prior Function            PT Goals (current goals can now be found in the care plan section) Acute Rehab PT Goals PT Goal Formulation: With family Time For Goal Achievement: 11/24/21 Potential to Achieve Goals: Fair Progress towards PT goals: Progressing toward goals    Frequency    Min 3X/week      PT Plan Current plan remains appropriate    Co-evaluation              AM-PAC PT "6 Clicks" Mobility   Outcome Measure  Help  needed turning from your back to your side while in a flat bed without using bedrails?: A Little Help needed moving from lying on your back to sitting on the side of a flat bed without using bedrails?: A Little Help needed moving to and from a bed to a chair (including a wheelchair)?: A Little Help needed standing up from a chair using your arms (e.g., wheelchair or bedside chair)?: A Little Help needed to walk in hospital room?: A Little Help needed climbing 3-5 steps with a railing? : A Lot 6 Click Score: 17    End of Session Equipment Utilized During Treatment: Gait belt Activity Tolerance: Patient tolerated treatment well Patient left: in chair;with call bell/phone within reach;with nursing/sitter in room (pt's sitter present, pt in bathroom) Nurse Communication: Mobility status PT Visit Diagnosis: Other abnormalities of gait and mobility (R26.89);Muscle weakness (generalized) (M62.81);Difficulty in walking, not elsewhere classified (R26.2);Ataxic gait (R26.0)     Time: [id number] PT Time Calculation (min) (ACUTE ONLY): 18 min  Charges:  $Gait Training: 8-22 mins                     Stacie Glaze, PT DPT Acute Rehabilitation Services Pager [phone]  Office [phone]  [place] 11/20/2021, 11:34 AM

## 2021-11-20 NOTE — Progress Notes (Signed)
[facility] PROGRESS NOTE   [name] ZOX:[id number] DOB: October 21, 1965 DOA: 10/21/2021  PCP: [name], MD  Brief History/Interval Summary: 56 year old WM PMHx Polysubstance abuse, Hepatitis C,    Presented to [facility] in [place] on 9/6 after being found down.  He was last known well at approximately 13:00 hours that day however EMS was called for wellness check at some time later on.  He was found to be down and unresponsive.  On EMS evaluation the patient was apneic with thready pulse.  He did not responded to intranasal Narcan.  He was bagged in route to the emergency department and subsequently intubated on arrival to the ED. CT head demonstrated cerebral edema without loss of white matter differentiation. He was transferred to [facility] for MRI. On 9/11 he was noted to have upper extremity edema on the right. Ultrasound demonstrated acute thrombosis in the mid and distal basilic vein. Hospital course also complicated by aspiration pneumonia. Subsequently transferred to [facility] on 9/13.   Events:  9/6 found down > present to [facility] ED. CT with bilateral cerebellar edema. 9/7 repeat CT normal Unable to be weaned from sedation 9/11 RUE DVT started on tx dose lovenox 9/13 transfer to [facility] for MRI 9/14 chest tube placed with intrapleural fibrinolytic administration 9/16 chest tube removed 9/17 patient extubated, remained agitated and started on precedex 9/19 Cortrak placed  10/4 Attempting to wean cortrak (increase PO intake more liberally over the next 24h per SLP recs) - once cortrak removed can start having reasonable discussion on disposition.  10/5 Cortrak accidentally removed - advancing diet as tolerated 10/6 - overnight agitation - haldol x1 given - continues to complain of "having a panic attack" but then describes reflux symptoms. 10/7-patient continues to have word finding difficulty, received benzos overnight as patient reportedly requested  outpatient Valium which she has not been on and does not currently take.  We will attempt to limit CNS depressing medications given his ongoing mental status changes from previous baseline secondary to below. Palliative will follow along from a distance which is certainly reasonable given his stable nature. 10/8 - diet continues to improve, initiate hydroxyzine PRN 10/9 -patient continues to progress, remains impulsive, difficult to reorient but able to ambulate somewhat today with physical therapy, recommendations pending but certainly approaching safe disposition to facility.  Unsafe discharge home alone or with family given need for 24-hour care and monitoring given his current mental status. 10/10 Patient now being evaluated for CIR placement - not appropriate for this level of care per CIR - (remains very impulsive and requires 24h monitoring but more redirectable as of late). PO intake markedly improving - no indication for PEG tube at this time (ate 100% of breakfast/lunch with minimal assistance).     Subjective/Interval History: Patient noted to be less impulsive this morning compared to yesterday.  Apparently slept well overnight.  No complaints offered.      Assessment/Plan:  Goals of care  Previous providers have had lengthy conversation with the patient's daughter. Family expectations is that patient will continue to recover back to previous baseline.  This was felt to be unlikely.  -Prior disposition was somewhat limited by NG tube placement with prior concern for possible PEG tube placement, now that dietary intake is markedly improving there is no further need to pursue PEG placement.   Acute toxic metabolic encephalopathy in the setting of ischemic stroke, stable: -In the setting of acute stroke/toxic encephalopathy versus possible previous anoxic event as  patient was found down in the field of unclear etiology and duration -with cocaine positive UDS Seen by neurology.  Patient  was given high-dose thiamine.  Now on once a day thiamine. -Per neurology high suspicion for anoxic brain injury versus toxin induced encephalopathy and ischemic strokes given negative EEG. -Avoid benzodiazepine as much as possible - Continue hydroxyzine PRN -Patient continues to become agitated likely secondary to ongoing confusion and difficulty with orientation, difficulty with word finding, poorly redirectable and his mood is quite labile, somewhat histrionic at times without clear provocation or inciting events. Patient started on Seroquel with dose increase yesterday.  Mentation seems to be slightly better today.  We will continue current dose for now.   Multifocal cerebral infarcts: -Neurology recommended to start aspirin once patient is off of anticoagulation (presumed DVT) -High risk for falls, discussed fall risk with family, safe disposition is paramount as below   Acute hypoxic respiratory failure secondary to aspiration pneumonia, resolved Right-sided aspiration pneumonia with parapneumonic effusion: -Status post chest tube placement with intrapleural lytics on 9/14, chest tube removed 9/16. -Completed 7 days of Zosyn -Intubated on admission outside hospital 9/6. -Extubated 9/17. -Currently on Room air.    Superficial vein thrombosis 9/11 Right basilic vein thrombus -Due to increase risk for progression of thrombosis patient was started on Lovenox per previous discussion with oncology (Dr. Clelia Croft). Patient has been transitioned to apixaban. Plan for 3 months of treatment which will be until December 11.   Dysphagia/nutrition:  Initially was found to be with significant oropharyngeal dysphagia.  Required tube feedings.  PEG was being considered however then his oral intake started improving.  No indication for PEG tube at this time.   Thrombocytosis: Resolved   Mild liver dysfunction/abnormal LFTs History of hepatitis C, improved.   Diarrhea, resolved:   Severe protein  calorie malnutrition Nutrition Problem: Severe Malnutrition Etiology: social / environmental circumstances (drug abuse) Signs/Symptoms: severe fat depletion, severe muscle depletion  DVT prophylaxis:   Apixaban Code Status: Full Code  Family Communication: No family at bedside this morning.   Disposition: Not a candidate for CIR.  SNF being pursued.  Continues to require sitter.  Hopefully we can discontinue sitter in the next 24 hours.   Status is: Inpatient Remains inpatient appropriate because: Needs placement.     Medications: Scheduled:  amLODipine  5 mg Oral Daily   apixaban  5 mg Oral BID   feeding supplement  237 mL Oral TID BM   lidocaine  1 patch Transdermal Q24H   melatonin  5 mg Oral QHS   pantoprazole  40 mg Oral Q1200   QUEtiapine  50 mg Oral QHS   thiamine  100 mg Oral Daily   Continuous: URK:[id number], calcium carbonate, docusate sodium, hydrOXYzine, ondansetron (ZOFRAN) IV, mouth rinse, polyethylene glycol  Antibiotics: Anti-infectives (From admission, onward)    Start     Dose/Rate Route Frequency Ordered Stop   10/23/21 1000  cefTRIAXone (ROCEPHIN) 2 g in sodium chloride 0.9 % 100 mL IVPB  Status:  Discontinued        2 g 200 mL/hr over 30 Minutes Intravenous Every 24 hours 10/22/21 0036 10/22/21 0037   10/23/21 1000  cefTRIAXone (ROCEPHIN) 2 g in sodium chloride 0.9 % 100 mL IVPB  Status:  Discontinued        2 g 200 mL/hr over 30 Minutes Intravenous Every 24 hours 10/22/21 0037 10/22/21 0827   10/22/21 0930  piperacillin-tazobactam (ZOSYN) IVPB 3.375 g  3.375 g 12.5 mL/hr over 240 Minutes Intravenous Every 8 hours 10/22/21 0833 10/29/21 0559   10/22/21 0915  ceFEPIme (MAXIPIME) 2 g in sodium chloride 0.9 % 100 mL IVPB  Status:  Discontinued        2 g 200 mL/hr over 30 Minutes Intravenous Every 8 hours 10/22/21 0827 10/22/21 0833       Objective:  Vital Signs  Vitals:   11/19/21 1611 11/19/21 2000 11/20/21 0500 11/20/21 0706  BP:  123/83 128/88 (!) 137/92 (!) 135/92  Pulse: 88 90 98 86  Resp: 18 18 18 18   Temp:  98 F (36.7 C) 98.4 F (36.9 C) 98.3 F (36.8 C)  TempSrc:  Oral Oral   SpO2: 100% 100% 100% 99%  Weight:   68.9 kg   Height:       No intake or output data in the 24 hours ending 11/20/21 1106  Filed Weights   11/18/21 0456 11/19/21 0500 11/20/21 0500  Weight: 70.6 kg 70.9 kg 68.9 kg    General appearance: Awake alert.  In no distress.  Less anxious today compared to yesterday Resp: Clear to auscultation bilaterally.  Normal effort Cardio: S1-S2 is normal regular.  No S3-S4.  No rubs murmurs or bruit GI: Abdomen is soft.  Nontender nondistended.  Bowel sounds are present normal.  No masses organomegaly Extremities: No edema.  Full range of motion of lower extremities.  Lab Results:  Data Reviewed: I have personally reviewed following labs and reports of the imaging studies  CBC: Recent Labs  Lab 11/20/21 0500  WBC 10.0  HGB 12.7*  HCT 37.1*  MCV 93.5  PLT 253     Basic Metabolic Panel: Recent Labs  Lab 11/20/21 0500  NA 139  K 4.1  CL 106  CO2 25  GLUCOSE 102*  BUN 15  CREATININE 0.81  CALCIUM 9.5  MG 2.0     GFR: Estimated Creatinine Clearance: 98.1 mL/min (by C-G formula based on SCr of 0.81 mg/dL).   Recent Labs  Lab 11/14/21 0340 11/15/21 0638 11/17/21 0446  AMMONIA 25 32 22       Radiology Studies: No results found.     LOS: 30 days   Marlayna Bannister 01/17/22 on [email or web address]  11/20/2021, 11:06 AM

## 2021-11-20 NOTE — Progress Notes (Signed)
Mobility Specialist: Progress Note   11/20/21 1151  Mobility  Activity Ambulated with assistance in hallway  Level of Assistance Contact guard assist, steadying assist  Assistive Device None  Distance Ambulated (ft) 200 ft  Activity Response Tolerated well  $Mobility charge 1 Mobility   Pt received up in room with NT and agreeable to mobility. C/o RLE pain during session, no rating given. Pt back to bed after session with NT present in the room.   [place] Lousie [place] Mobility Specialist Secure Chat Only

## 2021-11-21 DIAGNOSIS — G9340 Encephalopathy, unspecified: Secondary | ICD-10-CM | POA: Diagnosis not present

## 2021-11-21 DIAGNOSIS — I639 Cerebral infarction, unspecified: Secondary | ICD-10-CM | POA: Diagnosis not present

## 2021-11-21 MED ORDER — HALOPERIDOL 0.5 MG PO TABS
0.5000 mg | ORAL_TABLET | Freq: Once | ORAL | Status: AC
  Administered 2021-11-21: 0.5 mg via ORAL
  Filled 2021-11-21: qty 1

## 2021-11-21 MED ORDER — ENSURE ENLIVE PO LIQD
237.0000 mL | Freq: Two times a day (BID) | ORAL | Status: DC
  Administered 2021-11-22 – 2021-11-30 (×13): 237 mL via ORAL

## 2021-11-21 MED ORDER — QUETIAPINE FUMARATE 50 MG PO TABS
50.0000 mg | ORAL_TABLET | Freq: Two times a day (BID) | ORAL | Status: DC
  Administered 2021-11-21 – 2021-11-22 (×3): 50 mg via ORAL
  Filled 2021-11-21 (×3): qty 1

## 2021-11-21 NOTE — Plan of Care (Signed)

## 2021-11-21 NOTE — Progress Notes (Signed)
[facility] PROGRESS NOTE   [name] [id number] DOB: [birth year] DOA: 10/21/2021  PCP: [name], MD  Brief History/Interval Summary: 56 year old WM PMHx Polysubstance abuse, Hepatitis C,    Presented to [facility] in [place] on 9/6 after being found down.  He was last known well at approximately 13:00 hours that day however EMS was called for wellness check at some time later on.  He was found to be down and unresponsive.  On EMS evaluation the patient was apneic with thready pulse.  He did not responded to intranasal Narcan.  He was bagged in route to the emergency department and subsequently intubated on arrival to the ED. CT head demonstrated cerebral edema without loss of white matter differentiation. He was transferred to [facility] for MRI. On 9/11 he was noted to have upper extremity edema on the right. Ultrasound demonstrated acute thrombosis in the mid and distal basilic vein. Hospital course also complicated by aspiration pneumonia. Subsequently transferred to [facility] on 9/13.   Events:  9/6 found down > present to [facility] ED. CT with bilateral cerebellar edema. 9/7 repeat CT normal Unable to be weaned from sedation 9/11 RUE DVT started on tx dose lovenox 9/13 transfer to [facility] for MRI 9/14 chest tube placed with intrapleural fibrinolytic administration 9/16 chest tube removed 9/17 patient extubated, remained agitated and started on precedex 9/19 Cortrak placed  10/4 Attempting to wean cortrak (increase PO intake more liberally over the next 24h per SLP recs) - once cortrak removed can start having reasonable discussion on disposition.  10/5 Cortrak accidentally removed - advancing diet as tolerated 10/6 - overnight agitation - haldol x1 given - continues to complain of "having a panic attack" but then describes reflux symptoms. 10/7-patient continues to have word finding difficulty, received benzos overnight as patient reportedly requested  outpatient Valium which she has not been on and does not currently take.  We will attempt to limit CNS depressing medications given his ongoing mental status changes from previous baseline secondary to below. Palliative will follow along from a distance which is certainly reasonable given his stable nature. 10/8 - diet continues to improve, initiate hydroxyzine PRN 10/9 -patient continues to progress, remains impulsive, difficult to reorient but able to ambulate somewhat today with physical therapy, recommendations pending but certainly approaching safe disposition to facility.  Unsafe discharge home alone or with family given need for 24-hour care and monitoring given his current mental status. 10/10 Patient now being evaluated for CIR placement - not appropriate for this level of care per CIR - (remains very impulsive and requires 24h monitoring but more redirectable as of late). PO intake markedly improving - no indication for PEG tube at this time (ate 100% of breakfast/lunch with minimal assistance).     Subjective/Interval History: Overall patient appears to be less impulsive and less anxious compared to last week.  Denies any complaints.  He seems motivated to participate in rehabilitation.     Assessment/Plan:  Acute toxic metabolic encephalopathy in the setting of ischemic stroke, stable: -In the setting of acute stroke/toxic encephalopathy versus possible previous anoxic event as patient was found down in the field of unclear etiology and duration -with cocaine positive UDS Seen by neurology.  Patient was given high-dose thiamine.  Now on once a day thiamine. -Per neurology high suspicion for anoxic brain injury versus toxin induced encephalopathy and ischemic strokes given negative EEG. -Avoid benzodiazepine as much as possible - Continue hydroxyzine PRN Patient was noted to be  agitated which was thought to be secondary to ongoing confusion.  He was having difficulty word finding and  was poorly redirectable.  Mood was also labile. Patient was started on Seroquel with the dose adjusted yesterday.  This appears to have helped.  Will add a morning dose as well.  Hopefully his sitter can be discontinued in the next 24 to 48 hours.   Multifocal cerebral infarcts: -Neurology recommended to start aspirin once patient is off of anticoagulation (presumed DVT).  He will come off anticoagulation on December 11. -High risk for falls, discussed fall risk with family, safe disposition is paramount as below   Acute hypoxic respiratory failure secondary to aspiration pneumonia, resolved Right-sided aspiration pneumonia with parapneumonic effusion: -Status post chest tube placement with intrapleural lytics on 9/14, chest tube removed 9/16. -Completed 7 days of Zosyn -Intubated on admission outside hospital 9/6. -Extubated 9/17. Respiratory status is stable.   Superficial vein thrombosis 8/41 Right basilic vein thrombus -Due to increase risk for progression of thrombosis patient was started on Lovenox per previous discussion with oncology (Dr. Alen Blew). Patient has been transitioned to apixaban. Plan for 3 months of treatment which will be until December 11.   Dysphagia/nutrition:  Initially was found to be with significant oropharyngeal dysphagia.  Required tube feedings.  PEG was being considered however then his oral intake started improving.  No indication for PEG tube at this time.   Thrombocytosis: Resolved  Normocytic anemia Slight drop in hemoglobin as noted.  No evidence of overt bleeding.  Continue to monitor periodically.   Mild liver dysfunction/abnormal LFTs History of hepatitis C, improved.   Diarrhea, resolved:   Severe protein calorie malnutrition Nutrition Problem: Severe Malnutrition Etiology: social / environmental circumstances (drug abuse) Signs/Symptoms: severe fat depletion, severe muscle depletion  Goals of care  Previous providers have had lengthy  conversation with the patient's daughter. Family expectations is that patient will continue to recover back to previous baseline.  This was felt to be unlikely.  -Prior disposition was somewhat limited by NG tube placement with prior concern for possible PEG tube placement, now that dietary intake is markedly improving there is no further need to pursue PEG placement.  DVT prophylaxis:   Apixaban Code Status: Full Code  Family Communication: No family at bedside this morning.   Disposition: Not a candidate for CIR.  SNF being pursued.  Continues to require sitter.  Hopefully sitter can be discontinued in the next 24 to 48 hours.   Status is: Inpatient Remains inpatient appropriate because: Needs placement.     Medications: Scheduled:  amLODipine  5 mg Oral Daily   apixaban  5 mg Oral BID   feeding supplement  237 mL Oral TID BM   lidocaine  1 patch Transdermal Q24H   melatonin  5 mg Oral QHS   pantoprazole  40 mg Oral Q1200   QUEtiapine  50 mg Oral BID   thiamine  100 mg Oral Daily   Continuous: LKG:[id number], calcium carbonate, docusate sodium, hydrOXYzine, ondansetron (ZOFRAN) IV, mouth rinse, polyethylene glycol  Antibiotics: Anti-infectives (From admission, onward)    Start     Dose/Rate Route Frequency Ordered Stop   10/23/21 1000  cefTRIAXone (ROCEPHIN) 2 g in sodium chloride 0.9 % 100 mL IVPB  Status:  Discontinued        2 g 200 mL/hr over 30 Minutes Intravenous Every 24 hours 10/22/21 0036 10/22/21 0037   10/23/21 1000  cefTRIAXone (ROCEPHIN) 2 g in sodium chloride 0.9 % 100 mL IVPB  Status:  Discontinued        2 g 200 mL/hr over 30 Minutes Intravenous Every 24 hours 10/22/21 0037 10/22/21 0827   10/22/21 0930  piperacillin-tazobactam (ZOSYN) IVPB 3.375 g        3.375 g 12.5 mL/hr over 240 Minutes Intravenous Every 8 hours 10/22/21 0833 10/29/21 0559   10/22/21 0915  ceFEPIme (MAXIPIME) 2 g in sodium chloride 0.9 % 100 mL IVPB  Status:  Discontinued        2  g 200 mL/hr over 30 Minutes Intravenous Every 8 hours 10/22/21 0827 10/22/21 0833       Objective:  Vital Signs  Vitals:   11/20/21 1621 11/20/21 2305 11/21/21 0300 11/21/21 0754  BP: (!) 94/56 120/82 (P) 118/84 122/76  Pulse: 93 86  92  Resp: 20 18 (P) 16 18  Temp: 98 F (36.7 C) 98.7 F (37.1 C) (P) 98.2 F (36.8 C) 97.8 F (36.6 C)  TempSrc: Oral  (P) Oral Oral  SpO2: 100% 98% (P) 97% 100%  Weight:      Height:        Intake/Output Summary (Last 24 hours) at 11/21/2021 1015 Last data filed at 11/21/2021 0731 Gross per 24 hour  Intake 1488 ml  Output --  Net 1488 ml    Filed Weights   11/18/21 0456 11/19/21 0500 11/20/21 0500  Weight: 70.6 kg 70.9 kg 68.9 kg    General appearance: Awake alert.  In no distress.  Less distracted.  Seems to be less impulsive. Resp: Clear to auscultation bilaterally.  Normal effort Cardio: S1-S2 is normal regular.  No S3-S4.  No rubs murmurs or bruit GI: Abdomen is soft.  Nontender nondistended.  Bowel sounds are present normal.  No masses organomegaly Extremities: No edema.  Full range of motion of lower extremities.   Lab Results:  Data Reviewed: I have personally reviewed following labs and reports of the imaging studies  CBC: Recent Labs  Lab 11/20/21 0500  WBC 10.0  HGB 12.7*  HCT 37.1*  MCV 93.5  PLT 253     Basic Metabolic Panel: Recent Labs  Lab 11/20/21 0500  NA 139  K 4.1  CL 106  CO2 25  GLUCOSE 102*  BUN 15  CREATININE 0.81  CALCIUM 9.5  MG 2.0     GFR: Estimated Creatinine Clearance: 98.1 mL/min (by C-G formula based on SCr of 0.81 mg/dL).   Recent Labs  Lab 11/15/21 0638 11/17/21 0446  AMMONIA 32 22       Radiology Studies: No results found.     LOS: 31 days   Falesha Schommer [employer] on [email or web address]  11/21/2021, 10:15 AM

## 2021-11-22 DIAGNOSIS — F6381 Intermittent explosive disorder: Secondary | ICD-10-CM | POA: Diagnosis present

## 2021-11-22 DIAGNOSIS — G9340 Encephalopathy, unspecified: Secondary | ICD-10-CM | POA: Diagnosis not present

## 2021-11-22 DIAGNOSIS — I639 Cerebral infarction, unspecified: Secondary | ICD-10-CM | POA: Diagnosis not present

## 2021-11-22 MED ORDER — HALOPERIDOL 0.5 MG PO TABS
0.5000 mg | ORAL_TABLET | Freq: Three times a day (TID) | ORAL | Status: DC | PRN
  Administered 2021-11-22 – 2021-11-23 (×4): 0.5 mg via ORAL
  Filled 2021-11-22 (×4): qty 1

## 2021-11-22 MED ORDER — CARBAMAZEPINE 200 MG PO TABS
200.0000 mg | ORAL_TABLET | Freq: Two times a day (BID) | ORAL | Status: DC
  Administered 2021-11-22 – 2021-11-25 (×7): 200 mg via ORAL
  Filled 2021-11-22 (×8): qty 1

## 2021-11-22 MED ORDER — QUETIAPINE FUMARATE 100 MG PO TABS
100.0000 mg | ORAL_TABLET | Freq: Every day | ORAL | Status: DC
  Administered 2021-11-22 – 2021-11-29 (×8): 100 mg via ORAL
  Filled 2021-11-22 (×8): qty 1

## 2021-11-22 MED ORDER — HALOPERIDOL LACTATE 5 MG/ML IJ SOLN: 2.5000 mg | Freq: Once | INTRAMUSCULAR | Status: DC

## 2021-11-22 NOTE — Progress Notes (Signed)
Mobility Specialist Progress Note   11/22/21 0950  Mobility  Activity Ambulated independently in hallway  Level of Assistance Independent  Assistive Device None  Distance Ambulated (ft) 400 ft  Range of Motion/Exercises Active;All extremities  Activity Response Tolerated well   Patient received standing in doorway, agreeable to participate in mobility. Ambulated independently with steady gait. Returned to room without complaint or incident. Was left standing at bedside with all needs met, call bell in reach and sitter present.  [name], [place], [place]  [phone] Office: [phone]

## 2021-11-22 NOTE — Consult Note (Signed)
[facility] Face-to-Face Psychiatry Consult   Reason for Consult:'' patient with increasing agitation despite being on Seroquel Referring Physician:  Bonnielee Haff, MD Patient Identification: [name] MRN:  [id number] Principal Diagnosis: Acute encephalopathy Diagnosis:  Principal Problem:   Acute encephalopathy Active Problems:   Fever   Pleural effusion   Deep venous thrombosis ([place])   Acute respiratory failure with hypoxia ([place])   Cerebrovascular accident (CVA) ([place])   Protein-calorie malnutrition, severe   Anoxic brain injury ([place])   Intermittent explosive disorder   Total Time spent with patient: 1 hour  Subjective:   [name] is a 56 y.o. male patient admitted with altered mental status.  HPI:  56 year old Male with PMHx Polysubstance abuse, Hepatitis C, who was transferred to [facility] from [facility] in [place] for the treatment of acute toxic metabolic encephalopathy. Per chart review there is a high suspicion for anoxic brain injury vs toxin induced encephalopathy and Ischemic strokes. Psychiatric consult was requested due to patient having increased agitation despite being on Seroquel. Today, patient is alert, awake, oriented x 3, denies prior history of mental illness but admits to history of substance abuse including alcohol. He is unable to remember the sequence of events that lead to his current hospitalization but thank God that he is still alive so that he can continue to be good father to his 5 children. Patient and staff reports that he has been getting agitated very easily sometimes for no reason at all. Staff has observed periods when his mood is good, pleasant which alternates with impulsive, labile mood. Patient denies, delusions, psychosis and self harming thoughts. He agreed to take a mood stabilizer as he states that he wants to be back to his good self.    Past Psychiatric History: none reported by patient except substance abuse  Risk  to Self:  denies Risk to Others:  denies Prior Inpatient Therapy:  none reported Prior Outpatient Therapy:  none  Past Medical History:  Past Medical History:  Diagnosis Date   Acid reflux    Head injury    Hepatitis C    Hiatal hernia     Past Surgical History:  Procedure Laterality Date   ACROMIO-CLAVICULAR JOINT REPAIR Right 06/28/2018   Procedure: ACROMIO-CLAVICULAR JOINT IRRIGATION AND DEBRIDEMENT;  Surgeon: [name], MD;  Location: [facility];  Service: Orthopedics;  Laterality: Right;   FACIAL FRACTURE SURGERY     IRRIGATION AND DEBRIDEMENT SHOULDER Right 06/28/2018   Procedure: IRRIGATION AND DEBRIDEMENT SHOULDER;  Surgeon: [name], MD;  Location: [facility];  Service: Orthopedics;  Laterality: Right;   KNEE ARTHROSCOPY Left 06/23/2018   Procedure: LEFT KNEE ARTHROSCOPY KNEE I&D.;  Surgeon: [name], MD;  Location: [facility];  Service: Orthopedics;  Laterality: Left;   Family History:  Family History  Problem Relation Age of Onset   Diabetes Mother    Hypertension Mother    Hypertension Father    Family Psychiatric  History:   Social History:  Social History   Substance and Sexual Activity  Alcohol Use Yes   Comment: socially      Social History   Substance and Sexual Activity  Drug Use Not Currently   Types: Cocaine   Comment: states its "been a long time"     Social History   Socioeconomic History   Marital status: Single    Spouse name: Not on file   Number of children: Not on file   Years of education: Not on file  Highest education level: Not on file  Occupational History   Not on file  Tobacco Use   Smoking status: Every Day    Packs/day: 1.00    Years: 20.00    Total pack years: 20.00    Types: Cigarettes   Smokeless tobacco: Never  Substance and Sexual Activity   Alcohol use: Yes    Comment: socially    Drug use: Not Currently    Types: Cocaine    Comment: states its "been a long time"    Sexual activity: Not  Currently  Other Topics Concern   Not on file  Social History Narrative   Not on file   Social Determinants of Health   Financial Resource Strain: Not on file  Food Insecurity: Food Insecurity Present (11/16/2021)   Hunger Vital Sign    Worried About Running Out of Food in the Last Year: Often true    Ran Out of Food in the Last Year: Often true  Transportation Needs: Unmet Transportation Needs (11/16/2021)   PRAPARE - Hydrologist (Medical): Yes    Lack of Transportation (Non-Medical): Yes  Physical Activity: Not on file  Stress: Not on file  Social Connections: Not on file   Additional Social History:    Allergies:  No Known Allergies  Labs: No results found for this or any previous visit (from the past 48 hour(s)).  Current Facility-Administered Medications  Medication Dose Route Frequency Provider Last Rate Last Admin   acetaminophen (TYLENOL) tablet 650 mg  650 mg Oral Q6H PRN Opyd, Ilene Qua, MD   650 mg at 11/21/21 2104   amLODipine (NORVASC) tablet 5 mg  5 mg Oral Daily Pahwani, Einar Grad, MD   5 mg at 11/22/21 1113   apixaban (ELIQUIS) tablet 5 mg  5 mg Oral BID Little Ishikawa, MD   5 mg at 11/22/21 1114   calcium carbonate (TUMS - dosed in mg elemental calcium) chewable tablet 200 mg of elemental calcium  1 tablet Oral BID PRN Little Ishikawa, MD   200 mg of elemental calcium at 11/21/21 1943   carbamazepine (TEGRETOL) tablet 200 mg  200 mg Oral BID Sanoe Hazan, MD       docusate sodium (COLACE) capsule 100 mg  100 mg Oral BID PRN Opyd, Ilene Qua, MD       feeding supplement (ENSURE ENLIVE / ENSURE PLUS) liquid 237 mL  237 mL Oral BID BM Bonnielee Haff, MD   237 mL at 11/22/21 1115   haloperidol (HALDOL) tablet 0.5 mg  0.5 mg Oral Q8H PRN Bonnielee Haff, MD   0.5 mg at 11/22/21 0902   hydrOXYzine (ATARAX) tablet 25 mg  25 mg Oral Q6H PRN Little Ishikawa, MD   25 mg at 11/22/21 0518   lidocaine (LIDODERM) 5 % 1 patch  1 patch  Transdermal Q24H Jennette Kettle M, DO   1 patch at 11/22/21 0518   melatonin tablet 5 mg  5 mg Oral QHS Opyd, Ilene Qua, MD   5 mg at 11/21/21 2104   ondansetron (ZOFRAN) injection 4 mg  4 mg Intravenous Q6H PRN Etta Quill, DO   4 mg at 11/13/21 2315   Oral care mouth rinse  15 mL Mouth Rinse PRN Little Ishikawa, MD       pantoprazole (PROTONIX) EC tablet 40 mg  40 mg Oral Q1200 Bonnielee Haff, MD   40 mg at 11/22/21 1129   polyethylene glycol (MIRALAX / GLYCOLAX) packet  17 g  17 g Oral Daily PRN Corey Harold, NP   17 g at 11/09/21 2237   QUEtiapine (SEROQUEL) tablet 100 mg  100 mg Oral QHS Imraan Wendell, MD       thiamine (VITAMIN B1) tablet 100 mg  100 mg Oral Daily Opyd, Ilene Qua, MD   100 mg at 11/22/21 1113    Musculoskeletal: Strength & Muscle Tone: within normal limits Gait & Station: normal Patient leans: N/A     Psychiatric Specialty Exam:  Presentation  General Appearance:  Appropriate for Environment  Eye Contact: Good  Speech: Clear and Coherent  Speech Volume: Normal  Handedness: Right   Mood and Affect  Mood: Irritable  Affect: Labile; Tearful   Thought Process  Thought Processes: Linear  Descriptions of Associations:Intact  Orientation:Full (Time, Place and Person)  Thought Content:Logical; Rumination  History of Schizophrenia/Schizoaffective disorder:No data recorded Duration of Psychotic Symptoms:No data recorded Hallucinations:Hallucinations: None  Ideas of Reference:None  Suicidal Thoughts:Suicidal Thoughts: No  Homicidal Thoughts:Homicidal Thoughts: No   Sensorium  Memory: Immediate Fair; Recent Fair; Remote Fair  Judgment: Fair  Insight: Fair   Community education officer  Concentration: Fair  Attention Span: Fair  Recall: [place] of Knowledge: Fair  Language: Good   Psychomotor Activity  Psychomotor Activity: Psychomotor Activity: Increased   Assets  Assets: Communication Skills;  Desire for Improvement   Sleep  Sleep: Sleep: Fair   Physical Exam: Physical Exam Review of Systems  Psychiatric/Behavioral:  Negative for depression, hallucinations and suicidal ideas. The patient is not nervous/anxious.    Blood pressure 138/88, pulse 90, temperature 98.1 F (36.7 C), temperature source Oral, resp. rate 20, height 5\' 10"  (1.778 m), weight 68.9 kg, SpO2 96 %. Body mass index is 21.79 kg/m.  Treatment Plan Summary: Plan/Recommendations: -Change Seroquel 50 mg bid to 100 mg at bedtime for mood/sleep -Consider Carbamazepine 200 mg bid for agitation and mood stabilization -Continue Haldol 0.5 mg BID as needed for agitation -Consider referral to Neurologist to rule out traumatic brain injury -Continue Hydroxyzine 25 mg q6h prn for anxiety/gitation  Disposition: No evidence of imminent risk to self or others at present.   Patient does not meet criteria for psychiatric inpatient admission. Supportive therapy provided about ongoing stressors. Psychiatric service signing off. Re-consult as needed  Corena Pilgrim, MD 11/22/2021 12:00 PM

## 2021-11-22 NOTE — Plan of Care (Deleted)

## 2021-11-22 NOTE — Progress Notes (Signed)
Patient requesting anxiety medication, states " I feel so anxious, I am going to bust through the window".

## 2021-11-22 NOTE — Progress Notes (Addendum)
[facility] PROGRESS NOTE   [name] XFG:[id number] DOB: [birth year] DOA: 10/21/2021  PCP: [name], MD  Brief History/Interval Summary: 56 year old WM PMHx Polysubstance abuse, Hepatitis C,    Presented to [facility] in [place] on 9/6 after being found down.  He was last known well at approximately 13:00 hours that day however EMS was called for wellness check at some time later on.  He was found to be down and unresponsive.  On EMS evaluation the patient was apneic with thready pulse.  He did not responded to intranasal Narcan.  He was bagged in route to the emergency department and subsequently intubated on arrival to the ED. CT head demonstrated cerebral edema without loss of white matter differentiation. He was transferred to [facility] for MRI. On 9/11 he was noted to have upper extremity edema on the right. Ultrasound demonstrated acute thrombosis in the mid and distal basilic vein. Hospital course also complicated by aspiration pneumonia. Subsequently transferred to [facility] on 9/13.   Events:  9/6 found down > present to [facility] ED. CT with bilateral cerebellar edema. 9/7 repeat CT normal Unable to be weaned from sedation 9/11 RUE DVT started on tx dose lovenox 9/13 transfer to [facility] for MRI 9/14 chest tube placed with intrapleural fibrinolytic administration 9/16 chest tube removed 9/17 patient extubated, remained agitated and started on precedex 9/19 Cortrak placed  10/4 Attempting to wean cortrak (increase PO intake more liberally over the next 24h per SLP recs) - once cortrak removed can start having reasonable discussion on disposition.  10/5 Cortrak accidentally removed - advancing diet as tolerated 10/6 - overnight agitation - haldol x1 given - continues to complain of "having a panic attack" but then describes reflux symptoms. 10/7-patient continues to have word finding difficulty, received benzos overnight as patient reportedly requested  outpatient Valium which she has not been on and does not currently take.  We will attempt to limit CNS depressing medications given his ongoing mental status changes from previous baseline secondary to below. Palliative will follow along from a distance which is certainly reasonable given his stable nature. 10/8 - diet continues to improve, initiate hydroxyzine PRN 10/9 -patient continues to progress, remains impulsive, difficult to reorient but able to ambulate somewhat today with physical therapy, recommendations pending but certainly approaching safe disposition to facility.  Unsafe discharge home alone or with family given need for 24-hour care and monitoring given his current mental status. 10/10 Patient now being evaluated for CIR placement - not appropriate for this level of care per CIR - (remains very impulsive and requires 24h monitoring but more redirectable as of late). PO intake markedly improving - no indication for PEG tube at this time (ate 100% of breakfast/lunch with minimal assistance).     Subjective/Interval History: Patient was noted to be quite agitated this morning.  Asking about going home.  Worried about his young children.  Difficult to redirect.     Assessment/Plan:  Acute toxic metabolic encephalopathy in the setting of ischemic stroke, stable: -In the setting of acute stroke/toxic encephalopathy versus possible previous anoxic event as patient was found down in the field of unclear etiology and duration -with cocaine positive UDS Seen by neurology.  Patient was given high-dose thiamine.  Now on once a day thiamine. -Per neurology high suspicion for anoxic brain injury versus toxin induced encephalopathy and ischemic strokes given negative EEG. -Avoid benzodiazepine as much as possible - Continue hydroxyzine PRN Patient was noted to be agitated which  was thought to be secondary to ongoing confusion.  He was having difficulty word finding and was poorly redirectable.   Mood was also labile. Patient was started on Seroquel with which he appeared to be getting better.  However noted to be quite agitated this morning.  Requiring as needed Haldol.  QTc was normal in his last EKG. Patient's medical history was reviewed again.  It does not appear the patient has been seen by psychiatry during this admission.  They may offer alternative strategies.  We will consult psychiatry to assist with management. Continue sitter for now.   Multifocal cerebral infarcts: -Neurology recommended to start aspirin once patient is off of anticoagulation (presumed DVT).  He will come off anticoagulation on December 11. -High risk for falls, discussed fall risk with family, safe disposition is paramount as below   Acute hypoxic respiratory failure secondary to aspiration pneumonia, resolved Right-sided aspiration pneumonia with parapneumonic effusion: -Status post chest tube placement with intrapleural lytics on 9/14, chest tube removed 9/16. -Completed 7 days of Zosyn -Intubated on admission outside hospital 9/6. -Extubated 9/17. Respiratory status is stable.   Superficial vein thrombosis 9/11 Right basilic vein thrombus -Due to increase risk for progression of thrombosis patient was started on Lovenox per previous discussion with oncology (Dr. Clelia Croft). Patient has been transitioned to apixaban. Plan for 3 months of treatment which will be until December 11.   Dysphagia/nutrition:  Initially was found to be with significant oropharyngeal dysphagia.  Required tube feedings.  PEG was being considered however then his oral intake started improving.  No indication for PEG tube at this time.   Thrombocytosis: Resolved  Normocytic anemia Slight drop in hemoglobin was noted.  No evidence of overt bleeding.  Continue to monitor periodically.  We will recheck labs tomorrow.   Mild liver dysfunction/abnormal LFTs History of hepatitis C, improved.   Diarrhea, resolved:   Severe  protein calorie malnutrition Nutrition Problem: Severe Malnutrition Etiology: social / environmental circumstances (drug abuse) Signs/Symptoms: severe fat depletion, severe muscle depletion  Goals of care  Previous providers have had lengthy conversation with the patient's daughter. Family expectations is that patient will continue to recover back to previous baseline.  This was felt to be unlikely.  -Prior disposition was somewhat limited by NG tube placement with prior concern for possible PEG tube placement, now that dietary intake is markedly improving there is no further need to pursue PEG placement. Daughter was updated today, 10/15.  She did clarify that she was the eldest of the patient's children.  However the patient does have other children who are all minor.  So what patient mentioned this morning seems to be accurate.  DVT prophylaxis:   Apixaban Code Status: Full Code  Family Communication: No family at side Disposition: Not a candidate for CIR.  SNF being pursued.  Continues to require sitter.    Status is: Inpatient Remains inpatient appropriate because: Needs placement.     Medications: Scheduled:  amLODipine  5 mg Oral Daily   apixaban  5 mg Oral BID   feeding supplement  237 mL Oral BID BM   lidocaine  1 patch Transdermal Q24H   melatonin  5 mg Oral QHS   pantoprazole  40 mg Oral Q1200   QUEtiapine  50 mg Oral BID   thiamine  100 mg Oral Daily   Continuous: PIR:[id number], calcium carbonate, docusate sodium, haloperidol, hydrOXYzine, ondansetron (ZOFRAN) IV, mouth rinse, polyethylene glycol  Antibiotics: Anti-infectives (From admission, onward)    Start  Dose/Rate Route Frequency Ordered Stop   10/23/21 1000  cefTRIAXone (ROCEPHIN) 2 g in sodium chloride 0.9 % 100 mL IVPB  Status:  Discontinued        2 g 200 mL/hr over 30 Minutes Intravenous Every 24 hours 10/22/21 0036 10/22/21 0037   10/23/21 1000  cefTRIAXone (ROCEPHIN) 2 g in sodium chloride 0.9  % 100 mL IVPB  Status:  Discontinued        2 g 200 mL/hr over 30 Minutes Intravenous Every 24 hours 10/22/21 0037 10/22/21 0827   10/22/21 0930  piperacillin-tazobactam (ZOSYN) IVPB 3.375 g        3.375 g 12.5 mL/hr over 240 Minutes Intravenous Every 8 hours 10/22/21 0833 10/29/21 0559   10/22/21 0915  ceFEPIme (MAXIPIME) 2 g in sodium chloride 0.9 % 100 mL IVPB  Status:  Discontinued        2 g 200 mL/hr over 30 Minutes Intravenous Every 8 hours 10/22/21 0827 10/22/21 0833       Objective:  Vital Signs  Vitals:   11/21/21 1900 11/21/21 1958 11/21/21 2339 11/22/21 0318  BP: 124/85 131/87 120/74 138/88  Pulse: 98 95 88 90  Resp: 18 18 16 20   Temp: 98 F (36.7 C) 98.1 F (36.7 C) 98.4 F (36.9 C) 98.1 F (36.7 C)  TempSrc:   Oral Oral  SpO2: 100% 100% 100% 96%  Weight:      Height:        Intake/Output Summary (Last 24 hours) at 11/22/2021 1039 Last data filed at 11/22/2021 0815 Gross per 24 hour  Intake 480 ml  Output --  Net 480 ml    Filed Weights   11/18/21 0456 11/19/21 0500 11/20/21 0500  Weight: 70.6 kg 70.9 kg 68.9 kg    General appearance: Awake alert.  Quite agitated this morning. Resp: Clear to auscultation bilaterally.  Normal effort Cardio: S1-S2 is normal regular.  No S3-S4.  No rubs murmurs or bruit GI: Abdomen is soft.  Nontender nondistended.  Bowel sounds are present normal.  No masses organomegaly    Lab Results:  Data Reviewed: I have personally reviewed following labs and reports of the imaging studies  CBC: Recent Labs  Lab 11/20/21 0500  WBC 10.0  HGB 12.7*  HCT 37.1*  MCV 93.5  PLT 253     Basic Metabolic Panel: Recent Labs  Lab 11/20/21 0500  NA 139  K 4.1  CL 106  CO2 25  GLUCOSE 102*  BUN 15  CREATININE 0.81  CALCIUM 9.5  MG 2.0     GFR: Estimated Creatinine Clearance: 98.1 mL/min (by C-G formula based on SCr of 0.81 mg/dL).   Recent Labs  Lab 11/17/21 0446  AMMONIA 22       Radiology  Studies: No results found.     LOS: 32 days   Lanyah Spengler [employer] on [email or web address]  11/22/2021, 10:39 AM

## 2021-11-22 NOTE — Plan of Care (Signed)
  Problem: Education: Goal: Knowledge of General Education information will improve Description: Including pain rating scale, medication(s)/side effects and non-pharmacologic comfort measures 11/22/2021 1933 by Laure Kidney, RN Outcome: Not Progressing 11/22/2021 1931 by Laure Kidney, RN Outcome: Progressing   Problem: Health Behavior/Discharge Planning: Goal: Ability to manage health-related needs will improve 11/22/2021 1933 by Laure Kidney, RN Outcome: Not Progressing 11/22/2021 1931 by Laure Kidney, RN Outcome: Progressing   Problem: Clinical Measurements: Goal: Ability to maintain clinical measurements within normal limits will improve 11/22/2021 1933 by Laure Kidney, RN Outcome: Not Progressing 11/22/2021 1931 by Laure Kidney, RN Outcome: Progressing Goal: Will remain free from infection 11/22/2021 1933 by Laure Kidney, RN Outcome: Not Progressing 11/22/2021 1931 by Laure Kidney, RN Outcome: Progressing Goal: Diagnostic test results will improve 11/22/2021 1933 by Laure Kidney, RN Outcome: Not Progressing 11/22/2021 1931 by Laure Kidney, RN Outcome: Progressing Goal: Respiratory complications will improve 11/22/2021 1933 by Laure Kidney, RN Outcome: Not Progressing 11/22/2021 1931 by Laure Kidney, RN Outcome: Progressing Goal: Cardiovascular complication will be avoided 11/22/2021 1933 by Laure Kidney, RN Outcome: Not Progressing 11/22/2021 1931 by Laure Kidney, RN Outcome: Progressing   Problem: Activity: Goal: Risk for activity intolerance will decrease 11/22/2021 1933 by Laure Kidney, RN Outcome: Not Progressing 11/22/2021 1931 by Laure Kidney, RN Outcome: Progressing   Problem: Nutrition: Goal: Adequate nutrition will be maintained 11/22/2021 1933 by Laure Kidney, RN Outcome: Not Progressing 11/22/2021 1931 by Laure Kidney, RN Outcome: Progressing   Problem: Coping: Goal: Level of anxiety will decrease 11/22/2021  1933 by Laure Kidney, RN Outcome: Not Progressing 11/22/2021 1931 by Laure Kidney, RN Outcome: Progressing   Problem: Elimination: Goal: Will not experience complications related to bowel motility 11/22/2021 1933 by Laure Kidney, RN Outcome: Not Progressing 11/22/2021 1931 by Laure Kidney, RN Outcome: Progressing Goal: Will not experience complications related to urinary retention 11/22/2021 1933 by Laure Kidney, RN Outcome: Not Progressing 11/22/2021 1931 by Laure Kidney, RN Outcome: Progressing   Problem: Pain Managment: Goal: General experience of comfort will improve 11/22/2021 1933 by Laure Kidney, RN Outcome: Not Progressing 11/22/2021 1931 by Laure Kidney, RN Outcome: Progressing   Problem: Safety: Goal: Ability to remain free from injury will improve 11/22/2021 1933 by Laure Kidney, RN Outcome: Not Progressing 11/22/2021 1931 by Laure Kidney, RN Outcome: Progressing   Problem: Skin Integrity: Goal: Risk for impaired skin integrity will decrease 11/22/2021 1933 by Laure Kidney, RN Outcome: Not Progressing 11/22/2021 1931 by Laure Kidney, RN Outcome: Progressing   Problem: Safety: Goal: Non-violent Restraint(s) 11/22/2021 1933 by Laure Kidney, RN Outcome: Not Progressing 11/22/2021 1931 by Laure Kidney, RN Outcome: Progressing

## 2021-11-22 NOTE — Progress Notes (Signed)
Patient anxious, restless and agitated several times throughout the day and requesting medication. PRN haldol PO given at 0902, and Patient was able to speak with family and was briefly calm. Scheduled Seroquel given at 1113, PRN Atarax given at 1334. Patient was having difficulty being redirected when asked to stay in bed and would get more agitated by bed alarming. Patient asking to call "Gaylyn Lambert" and "Hilliard Clark" and when unable to, verbalized frustration and stated " I am going to call 911, I know many Sheriff's deputies and they will come get me" Explained to patient again that we are in the hospital. Patient states " I want to leave." Patient walked in hallway several times with NT, RN and Mobility specialist to help alleviate some anxiety and restlessness. When RN walked with patient at 1530 Patient attempted to enter 2 other patient rooms, MD notified, and PRN Tylenol administered per MD. One time order for PRN IM Haldol received, but Patient able to calm down while RN sat in room, walked with and reassured patient. RN spent approximately 1 hr with patient sitting in room, and walking in hallway from between 1530 and 1630 patient able to calm down after being reoriented and reassured extensively. IM Haldol did not need to be administered afterwards, but per MD one time IM Haldol can be administered later this evening if needed.

## 2021-11-23 DIAGNOSIS — G9340 Encephalopathy, unspecified: Secondary | ICD-10-CM | POA: Diagnosis not present

## 2021-11-23 LAB — CBC
HCT: 37.8 % — ABNORMAL LOW (ref 39.0–52.0)
Hemoglobin: 12.5 g/dL — ABNORMAL LOW (ref 13.0–17.0)
MCH: 31.6 pg (ref 26.0–34.0)
MCHC: 33.1 g/dL (ref 30.0–36.0)
MCV: 95.7 fL (ref 80.0–100.0)
Platelets: 311 10*3/uL (ref 150–400)
RBC: 3.95 MIL/uL — ABNORMAL LOW (ref 4.22–5.81)
RDW: 13.5 % (ref 11.5–15.5)
WBC: 9.2 10*3/uL (ref 4.0–10.5)
nRBC: 0 % (ref 0.0–0.2)

## 2021-11-23 LAB — COMPREHENSIVE METABOLIC PANEL
ALT: 44 U/L (ref 0–44)
AST: 23 U/L (ref 15–41)
Albumin: 3.5 g/dL (ref 3.5–5.0)
Alkaline Phosphatase: 92 U/L (ref 38–126)
Anion gap: 6 (ref 5–15)
BUN: 19 mg/dL (ref 6–20)
CO2: 25 mmol/L (ref 22–32)
Calcium: 9.2 mg/dL (ref 8.9–10.3)
Chloride: 105 mmol/L (ref 98–111)
Creatinine, Ser: 0.76 mg/dL (ref 0.61–1.24)
GFR, Estimated: >60 mL/min (ref >60)
Glucose, Bld: 78 mg/dL (ref 70–99)
Potassium: 3.9 mmol/L (ref 3.5–5.1)
Sodium: 136 mmol/L (ref 135–145)
Total Bilirubin: 0.1 mg/dL — ABNORMAL LOW (ref 0.3–1.2)
Total Protein: 6.6 g/dL (ref 6.5–8.1)

## 2021-11-23 MED ORDER — ZIPRASIDONE MESYLATE 20 MG IM SOLR
20.0000 mg | Freq: Once | INTRAMUSCULAR | Status: AC
  Administered 2021-11-23: 20 mg via INTRAMUSCULAR
  Filled 2021-11-23 (×2): qty 20

## 2021-11-23 MED ORDER — HALOPERIDOL LACTATE 5 MG/ML IJ SOLN
2.0000 mg | Freq: Four times a day (QID) | INTRAMUSCULAR | Status: DC | PRN
  Administered 2021-11-23 – 2021-11-26 (×2): 2 mg via INTRAMUSCULAR
  Filled 2021-11-23 (×2): qty 1

## 2021-11-23 MED ORDER — STERILE WATER FOR INJECTION IJ SOLN
INTRAMUSCULAR | Status: AC
  Filled 2021-11-23: qty 10

## 2021-11-23 NOTE — Progress Notes (Signed)
Atarax given early due to agitation, [facility] MD informed. See new orders.   Patient continues agitated, anxious and restless. Family called, daughter [name] on the phone with patient.   No change in behavior. Will continue to monitor.

## 2021-11-23 NOTE — TOC Progression Note (Signed)
Transition of Care [facility]) - Progression Note    Patient Details  Name: [name] MRN: [id number] Date of Birth: 1965-06-16  Transition of Care [facility]) CM/SW Contact  Pollie Friar, RN Phone Number: 11/23/2021, 2:47 PM  Clinical Narrative:    Pt continues to required medications and sitters due to confusion and agitation.  No bed offers.  TOC continuing to work on discharge disposition.    Expected Discharge Plan: Skilled Nursing Facility Barriers to Discharge: Continued Medical Work up, [place] will not accept until restraint criteria met, Requiring sitter/restraints  Expected Discharge Plan and Services Expected Discharge Plan: [place] Choice: IP Rehab Living arrangements for the past 2 months: Single Family Home                                       Social Determinants of Health (SDOH) Interventions    Readmission Risk Interventions     No data to display

## 2021-11-23 NOTE — Progress Notes (Signed)
Patient has increased anxiety, continues leaving the bed and room refusing redirection.   [facility] MD paged regarding the need for anxiety medication.

## 2021-11-23 NOTE — Progress Notes (Signed)
[facility] PROGRESS NOTE   [name] [name]:[id number] DOB: 07/11/1965 DOA: 10/21/2021  PCP: [name], MD  Brief History/Interval Summary: 56 year old WM PMHx Polysubstance abuse, Hepatitis C,    Presented to [facility] in [place] on 9/6 after being found down.  He was last known well at approximately 13:00 hours that day however EMS was called for wellness check at some time later on.  He was found to be down and unresponsive.  On EMS evaluation the patient was apneic with thready pulse.  He did not responded to intranasal Narcan.  He was bagged in route to the emergency department and subsequently intubated on arrival to the ED. CT head demonstrated cerebral edema without loss of white matter differentiation. He was transferred to [facility] for MRI. On 9/11 he was noted to have upper extremity edema on the right. Ultrasound demonstrated acute thrombosis in the mid and distal basilic vein. Hospital course also complicated by aspiration pneumonia. Subsequently transferred to [facility] on 9/13.   Events:  9/6 found down > present to [facility] ED. CT with bilateral cerebellar edema. 9/7 repeat CT normal Unable to be weaned from sedation 9/11 RUE DVT started on tx dose lovenox 9/13 transfer to [facility] for MRI 9/14 chest tube placed with intrapleural fibrinolytic administration 9/16 chest tube removed 9/17 patient extubated, remained agitated and started on precedex 9/19 Cortrak placed  10/4 Attempting to wean cortrak (increase PO intake more liberally over the next 24h per SLP recs) - once cortrak removed can start having reasonable discussion on disposition.  10/5 Cortrak accidentally removed - advancing diet as tolerated 10/6 - overnight agitation - haldol x1 given - continues to complain of "having a panic attack" but then describes reflux symptoms. 10/7-patient continues to have word finding difficulty, received benzos overnight as patient reportedly requested  outpatient Valium which she has not been on and does not currently take.  We will attempt to limit CNS depressing medications given his ongoing mental status changes from previous baseline secondary to below. Palliative will follow along from a distance which is certainly reasonable given his stable nature. 10/8 - diet continues to improve, initiate hydroxyzine PRN 10/9 -patient continues to progress, remains impulsive, difficult to reorient but able to ambulate somewhat today with physical therapy, recommendations pending but certainly approaching safe disposition to facility.  Unsafe discharge home alone or with family given need for 24-hour care and monitoring given his current mental status. 10/10 Patient now being evaluated for CIR placement - not appropriate for this level of care per CIR - (remains very impulsive and requires 24h monitoring but more redirectable as of late). PO intake markedly improving - no indication for PEG tube at this time (ate 100% of breakfast/lunch with minimal assistance).     Subjective/Interval History: Overnight events noted.  Had to be given Geodon x1.  Still quite anxious and agitated this morning.     Assessment/Plan:  Acute toxic metabolic encephalopathy in the setting of ischemic stroke, stable: -In the setting of acute stroke/toxic encephalopathy versus possible previous anoxic event as patient was found down in the field of unclear etiology and duration -with cocaine positive UDS Seen by neurology.  Patient was given high-dose thiamine.  Now on once a day thiamine. -Per neurology high suspicion for anoxic brain injury versus toxin induced encephalopathy and ischemic strokes given negative EEG. -Avoid benzodiazepine as much as possible - Continue hydroxyzine PRN Patient was noted to be agitated which was thought to be secondary to  ongoing confusion.  He was having difficulty word finding and was poorly redirectable.  Mood was also labile. Patient was  started on Seroquel with which he appeared to be getting better.   However he was significantly agitated over the last 36 hours.  Required multiple doses of Haldol.   Was seen by psychiatry yesterday and discharged him on Tegretol and changed Seroquel to nightly dose.  Despite that patient was agitated and required Geodon early this morning.  We will request psychiatry to continue to follow him for now.   Continue sitter for now.   Multifocal cerebral infarcts: -Neurology recommended to start aspirin once patient is off of anticoagulation (presumed DVT).  He will come off anticoagulation on December 11. -High risk for falls, discussed fall risk with family, safe disposition is paramount as below   Acute hypoxic respiratory failure secondary to aspiration pneumonia, resolved Right-sided aspiration pneumonia with parapneumonic effusion: -Status post chest tube placement with intrapleural lytics on 9/14, chest tube removed 9/16. -Completed 7 days of Zosyn -Intubated on admission outside hospital 9/6. -Extubated 9/17. Respiratory status is stable.   Superficial vein thrombosis 9/11 Right basilic vein thrombus -Due to increase risk for progression of thrombosis patient was started on Lovenox per previous discussion with oncology (Dr. Clelia Croft). Patient has been transitioned to apixaban. Plan for 3 months of treatment which will be until December 11.   Dysphagia/nutrition:  Initially was found to be with significant oropharyngeal dysphagia.  Required tube feedings.  PEG was being considered however then his oral intake started improving.  No indication for PEG tube at this time.   Thrombocytosis: Resolved  Normocytic anemia Slight drop in hemoglobin was noted.  Hemoglobin noted to be stable this morning.  Check every few days.   Mild liver dysfunction/abnormal LFTs History of hepatitis C, improved.   Diarrhea, resolved:   Severe protein calorie malnutrition Nutrition Problem: Severe  Malnutrition Etiology: social / environmental circumstances (drug abuse) Signs/Symptoms: severe fat depletion, severe muscle depletion  Goals of care  Previous providers have had lengthy conversation with the patient's daughter. Family expectations is that patient will continue to recover back to previous baseline.  This was felt to be unlikely.  -Prior disposition was somewhat limited by NG tube placement with prior concern for possible PEG tube placement, now that dietary intake is markedly improving there is no further need to pursue PEG placement. Daughter was updated 10/15.  She did clarify that she was the eldest of the patient's children.  However the patient does have other children who are all minor.     DVT prophylaxis:   Apixaban Code Status: Full Code  Family Communication: No family at side Disposition: Not a candidate for CIR.  SNF being pursued.  Continues to require sitter.    Status is: Inpatient Remains inpatient appropriate because: Needs placement.     Medications: Scheduled:  amLODipine  5 mg Oral Daily   apixaban  5 mg Oral BID   carbamazepine  200 mg Oral BID   feeding supplement  237 mL Oral BID BM   haloperidol lactate  2.5 mg Intramuscular Once   lidocaine  1 patch Transdermal Q24H   melatonin  5 mg Oral QHS   pantoprazole  40 mg Oral Q1200   QUEtiapine  100 mg Oral QHS   thiamine  100 mg Oral Daily   Continuous: HEN:IDPOEUMPNTIRW, calcium carbonate, docusate sodium, haloperidol, hydrOXYzine, ondansetron (ZOFRAN) IV, mouth rinse, polyethylene glycol  Antibiotics: Anti-infectives (From admission, onward)    Start  Dose/Rate Route Frequency Ordered Stop   10/23/21 1000  cefTRIAXone (ROCEPHIN) 2 g in sodium chloride 0.9 % 100 mL IVPB  Status:  Discontinued        2 g 200 mL/hr over 30 Minutes Intravenous Every 24 hours 10/22/21 0036 10/22/21 0037   10/23/21 1000  cefTRIAXone (ROCEPHIN) 2 g in sodium chloride 0.9 % 100 mL IVPB  Status:   Discontinued        2 g 200 mL/hr over 30 Minutes Intravenous Every 24 hours 10/22/21 0037 10/22/21 0827   10/22/21 0930  piperacillin-tazobactam (ZOSYN) IVPB 3.375 g        3.375 g 12.5 mL/hr over 240 Minutes Intravenous Every 8 hours 10/22/21 0833 10/29/21 0559   10/22/21 0915  ceFEPIme (MAXIPIME) 2 g in sodium chloride 0.9 % 100 mL IVPB  Status:  Discontinued        2 g 200 mL/hr over 30 Minutes Intravenous Every 8 hours 10/22/21 0827 10/22/21 0833       Objective:  Vital Signs  Vitals:   11/23/21 0014 11/23/21 0500 11/23/21 0843 11/23/21 1237  BP: (!) 126/91  137/83 (!) 137/94  Pulse: (!) 102  97 (!) 105  Resp: 16  19 20   Temp: 98.1 F (36.7 C)  97.7 F (36.5 C) 97.7 F (36.5 C)  TempSrc: Oral  Oral Oral  SpO2: 100%  99% 100%  Weight:  73.6 kg    Height:        Intake/Output Summary (Last 24 hours) at 11/23/2021 1250 Last data filed at 11/23/2021 0846 Gross per 24 hour  Intake 417 ml  Output --  Net 417 ml    Filed Weights   11/19/21 0500 11/20/21 0500 11/23/21 0500  Weight: 70.9 kg 68.9 kg 73.6 kg    General appearance: Awake alert.  In no distress.  Remains quite distracted and anxious. Resp: Clear to auscultation bilaterally.  Normal effort Cardio: S1-S2 is normal regular.  No S3-S4.  No rubs murmurs or bruit GI: Abdomen is soft.  Nontender nondistended.  Bowel sounds are present normal.  No masses organomegaly   Lab Results:  Data Reviewed: I have personally reviewed following labs and reports of the imaging studies  CBC: Recent Labs  Lab 11/20/21 0500 11/23/21 0827  WBC 10.0 9.2  HGB 12.7* 12.5*  HCT 37.1* 37.8*  MCV 93.5 95.7  PLT 253 311     Basic Metabolic Panel: Recent Labs  Lab 11/20/21 0500 11/23/21 0827  NA 139 136  K 4.1 3.9  CL 106 105  CO2 25 25  GLUCOSE 102* 78  BUN 15 19  CREATININE 0.81 0.76  CALCIUM 9.5 9.2  MG 2.0  --      GFR: Estimated Creatinine Clearance: 105.2 mL/min (by C-G formula based on SCr of 0.76  mg/dL).   Recent Labs  Lab 11/17/21 0446  AMMONIA 22       Radiology Studies: No results found.     LOS: 33 days   Aliena Ghrist 01/17/22 on [email or web address]  11/23/2021, 12:50 PM

## 2021-11-23 NOTE — Progress Notes (Signed)
Patient endorses anxiety, states "I feel crazy, my bipolar is acting up".   Patient verbalizes desire to leave, PRN medication administered per order.   Safety sitter at bedside. Patient in bed, call bell within reach, safety ensured.

## 2021-11-23 NOTE — Progress Notes (Signed)
Patient endorses anxiety, verbalizes feelings of hopelessness and wanting to leave.   PRN medication administered with no change in behavior.   [facility] MD informed.

## 2021-11-23 NOTE — Progress Notes (Signed)
Patient assisted to the chair, complains of discomfort in the bed. He was offered a hot patch and repositioned.   Chair alarm on, call bell within reach, tele sitter present. Safety ensured.

## 2021-11-23 NOTE — Progress Notes (Signed)
PRN medication given to help with anxiety.

## 2021-11-24 DIAGNOSIS — G9340 Encephalopathy, unspecified: Secondary | ICD-10-CM | POA: Diagnosis not present

## 2021-11-24 MED ORDER — ONDANSETRON 4 MG PO TBDP
4.0000 mg | ORAL_TABLET | Freq: Three times a day (TID) | ORAL | Status: DC | PRN
  Administered 2021-11-24: 4 mg via ORAL
  Filled 2021-11-24: qty 1

## 2021-11-24 MED ORDER — QUETIAPINE FUMARATE 25 MG PO TABS
25.0000 mg | ORAL_TABLET | Freq: Two times a day (BID) | ORAL | Status: DC
  Administered 2021-11-24 – 2021-11-30 (×13): 25 mg via ORAL
  Filled 2021-11-24 (×13): qty 1

## 2021-11-24 NOTE — Progress Notes (Signed)
Physical Therapy Treatment Patient Details Name: [name] MRN: [id number] DOB: 11-Jan-1966 Today's Date: 11/24/2021   History of Present Illness Pt is 56 yo male admitted on to [facility] health in [place] on 9/6 after being found unresponsive.  Pt transferred to [facility] on 10/21/21.  Pt with acute toxic metabolic encephalopathy vs encephalitis in pt with acute ischemic strokes per neurology.  9/11 R UE DVT started on lovenox, 9/14-9/16 chest tube, ETT 9/6-9/18.  Hx of polysubstance abuse, hep C, and GERD.    PT Comments    Pt remains very cognitively impaired. Pt confabulating, paranoid, reports "I'm anxious," is easily distracted but also hyperfocused on "that woman" who is not present, possible hallucination. Pt ambulating with close min guard due to drifting L and R and impulsivity. Attempted to have patient engage in coloring and/or word search in efforts to calm patient and/or distract pt however unsuccessful. PT did request order for patient to be able to go outside. MD did place order and mobility specialist to take patient outside today. PT freq decreased to 1x/wk as pt mobilizing well however due to cognitive impairments functional progression to home alone is difficult. Pt to greatly benefit from memory care unit as pt unsafe to return home alone and does not have any family support. Acute PT to cont monitor patient.    Recommendations for follow up therapy are one component of a multi-disciplinary discharge planning process, led by the attending physician.  Recommendations may be updated based on patient status, additional functional criteria and insurance authorization.  Follow Up Recommendations  Skilled nursing-short term rehab (<3 hours/day) Can patient physically be transported by private vehicle: No   Assistance Recommended at Discharge Frequent or constant Supervision/Assistance  Patient can return home with the following Assistance with cooking/housework;Assistance with  feeding;Help with stairs or ramp for entrance;A little help with walking and/or transfers;A little help with bathing/dressing/bathroom;Direct supervision/assist for medications management;Direct supervision/assist for financial management;Assist for transportation   Equipment Recommendations       Recommendations for Other Services       Precautions / Restrictions Precautions Precautions: Fall Precaution Comments: impulsive, paranoid, anxious Restrictions Weight Bearing Restrictions: No     Mobility  Bed Mobility               General bed mobility comments: pt up and down in room, ambulating out in the hallway    Transfers Overall transfer level: Needs assistance Equipment used: None Transfers: Sit to/from Stand Sit to Stand: Supervision           General transfer comment: supervision for safety due to impulsivity    Ambulation/Gait Ambulation/Gait assistance: Min guard Gait Distance (Feet): 160 Feet Assistive device: None Gait Pattern/deviations: Step-through pattern, Drifts right/left Gait velocity: decr Gait velocity interpretation: <1.31 ft/sec, indicative of household ambulator   General Gait Details: close guard for safety due to impulsivity. Pt with drifting R/L due to being paranoid and constantly looking around and trying to find "that woman"   Stairs             Wheelchair Mobility    Modified Rankin (Stroke Patients Only) Modified Rankin (Stroke Patients Only) Pre-Morbid Rankin Score: No symptoms Modified Rankin: Moderately severe disability     Balance Overall balance assessment: Needs assistance Sitting-balance support: No upper extremity supported, Feet supported Sitting balance-Leahy Scale: Fair     Standing balance support: No upper extremity supported, During functional activity Standing balance-Leahy Scale: Fair  Cognition Arousal/Alertness: Awake/alert Behavior During Therapy:  Anxious, Restless, Impulsive Overall Cognitive Status: Impaired/Different from baseline Area of Impairment: Orientation, Attention, Following commands, Safety/judgement, Awareness, Problem solving                 Orientation Level: Disoriented to, Place, Time, Situation Current Attention Level: Focused   Following Commands: Follows one step commands inconsistently (due to inability to focus/high distractibility) Safety/Judgement: Decreased awareness of safety, Decreased awareness of deficits Awareness: Emergent (takes self to the bathroom) Problem Solving: Requires verbal cues, Requires tactile cues General Comments: pt paranoid, confabulating, hallucinating, confused, not oriented, hyperfocused on "that woman never came to help me when I was throwing up in this can". Pt hyperfocused with high distractibility. Pt reports "I"m so anxious."        Exercises      General Comments General comments (skin integrity, edema, etc.): VSS      Pertinent Vitals/Pain Pain Assessment Pain Assessment: Faces Faces Pain Scale: No hurt    Home Living                          Prior Function            PT Goals (current goals can now be found in the care plan section) Acute Rehab PT Goals PT Goal Formulation: Patient unable to participate in goal setting Time For Goal Achievement: 12/08/21 Potential to Achieve Goals: Fair Progress towards PT goals: Progressing toward goals    Frequency    Min 1X/week      PT Plan Frequency needs to be updated    Co-evaluation              AM-PAC PT "6 Clicks" Mobility   Outcome Measure  Help needed turning from your back to your side while in a flat bed without using bedrails?: A Little Help needed moving from lying on your back to sitting on the side of a flat bed without using bedrails?: A Little Help needed moving to and from a bed to a chair (including a wheelchair)?: A Little Help needed standing up from a chair  using your arms (e.g., wheelchair or bedside chair)?: A Little Help needed to walk in hospital room?: A Little Help needed climbing 3-5 steps with a railing? : A Lot 6 Click Score: 17    End of Session   Activity Tolerance: Treatment limited secondary to agitation Patient left:  (standing in room with sitter) Nurse Communication: Mobility status PT Visit Diagnosis: Other abnormalities of gait and mobility (R26.89);Muscle weakness (generalized) (M62.81);Difficulty in walking, not elsewhere classified (R26.2);Ataxic gait (R26.0)     Time: 0762-2633 PT Time Calculation (min) (ACUTE ONLY): 15 min  Charges:  $Gait Training: 8-22 mins                     [name], PT, DPT Acute Rehabilitation Services Secure chat preferred Office #: [phone]    Berline Lopes 11/24/2021, 12:58 PM

## 2021-11-24 NOTE — Progress Notes (Signed)
[facility] PROGRESS NOTE   [name] LKG:[id number] DOB: 01-21-1966 DOA: 10/21/2021  PCP: [name], MD  Brief History/Interval Summary: 56 year old WM PMHx Polysubstance abuse, Hepatitis C,    Presented to [facility] in [place] on 9/6 after being found down.  He was last known well at approximately 13:00 hours that day however EMS was called for wellness check at some time later on.  He was found to be down and unresponsive.  On EMS evaluation the patient was apneic with thready pulse.  He did not responded to intranasal Narcan.  He was bagged in route to the emergency department and subsequently intubated on arrival to the ED. CT head demonstrated cerebral edema without loss of white matter differentiation. He was transferred to [facility] for MRI. On 9/11 he was noted to have upper extremity edema on the right. Ultrasound demonstrated acute thrombosis in the mid and distal basilic vein. Hospital course also complicated by aspiration pneumonia. Subsequently transferred to [facility] on 9/13.   Events:  9/6 found down > present to [facility] ED. CT with bilateral cerebellar edema. 9/7 repeat CT normal Unable to be weaned from sedation 9/11 RUE DVT started on tx dose lovenox 9/13 transfer to [facility] for MRI 9/14 chest tube placed with intrapleural fibrinolytic administration 9/16 chest tube removed 9/17 patient extubated, remained agitated and started on precedex 9/19 Cortrak placed  10/4 Attempting to wean cortrak (increase PO intake more liberally over the next 24h per SLP recs) - once cortrak removed can start having reasonable discussion on disposition.  10/5 Cortrak accidentally removed - advancing diet as tolerated 10/6 - overnight agitation - haldol x1 given - continues to complain of "having a panic attack" but then describes reflux symptoms. 10/7-patient continues to have word finding difficulty, received benzos overnight as patient reportedly requested  outpatient Valium which she has not been on and does not currently take.  We will attempt to limit CNS depressing medications given his ongoing mental status changes from previous baseline secondary to below. Palliative will follow along from a distance which is certainly reasonable given his stable nature. 10/8 - diet continues to improve, initiate hydroxyzine PRN 10/9 -patient continues to progress, remains impulsive, difficult to reorient but able to ambulate somewhat today with physical therapy, recommendations pending but certainly approaching safe disposition to facility.  Unsafe discharge home alone or with family given need for 24-hour care and monitoring given his current mental status. 10/10 Patient now being evaluated for CIR placement - not appropriate for this level of care per CIR - (remains very impulsive and requires 24h monitoring but more redirectable as of late). PO intake markedly improving - no indication for PEG tube at this time (ate 100% of breakfast/lunch with minimal assistance).     Subjective/Interval History: Patient noted to be pleasant this morning.  Denies any pain.   Assessment/Plan:  Acute toxic metabolic encephalopathy in the setting of ischemic stroke, stable: -In the setting of acute stroke/toxic encephalopathy versus possible previous anoxic event as patient was found down in the field of unclear etiology and duration -with cocaine positive UDS Seen by neurology.  Patient was given high-dose thiamine.  Now on once a day thiamine. -Per neurology high suspicion for anoxic brain injury versus toxin induced encephalopathy and ischemic strokes given negative EEG. -Avoid benzodiazepine as much as possible - Continue hydroxyzine PRN Patient was noted to be agitated which was thought to be secondary to ongoing confusion.  He was having difficulty word finding  and was poorly redirectable.  Mood was also labile. Patient was started on Seroquel with which he appeared to  be getting better.   However he was significantly agitated over the last 36 hours.  Required multiple doses of Haldol.   Was seen by psychiatry on 10/15 and they started him on Tegretol and change the Seroquel to nightly dose.  However he remained agitated and required Geodon x1.  Psychiatry has been reconsulted.  Continue sitter for now.     Multifocal cerebral infarcts: -Neurology recommended to start aspirin once patient is off of anticoagulation (presumed DVT).  He will come off anticoagulation on December 11. -High risk for falls, discussed fall risk with family, safe disposition is paramount as below   Acute hypoxic respiratory failure secondary to aspiration pneumonia, resolved Right-sided aspiration pneumonia with parapneumonic effusion: -Status post chest tube placement with intrapleural lytics on 9/14, chest tube removed 9/16. -Completed 7 days of Zosyn -Intubated on admission outside hospital 9/6. -Extubated 9/17. Respiratory status is stable.   Superficial vein thrombosis 9/11 Right basilic vein thrombus -Due to increase risk for progression of thrombosis patient was started on Lovenox per previous discussion with oncology (Dr. Clelia Croft). Patient has been transitioned to apixaban. Plan for 3 months of treatment which will be until December 11.   Dysphagia/nutrition:  Initially was found to be with significant oropharyngeal dysphagia.  Required tube feedings.  PEG was being considered however then his oral intake started improving.  No indication for PEG tube at this time.   Thrombocytosis: Resolved  Normocytic anemia Slight drop in hemoglobin was noted.  Hemoglobin noted to be stable this morning.  Check every few days.   Mild liver dysfunction/abnormal LFTs History of hepatitis C, improved.   Diarrhea, resolved:   Severe protein calorie malnutrition Nutrition Problem: Severe Malnutrition Etiology: social / environmental circumstances (drug abuse) Signs/Symptoms: severe  fat depletion, severe muscle depletion  Goals of care  Previous providers have had lengthy conversation with the patient's daughter. Family expectations is that patient will continue to recover back to previous baseline.  This was felt to be unlikely.  -Prior disposition was somewhat limited by NG tube placement with prior concern for possible PEG tube placement, now that dietary intake is markedly improving there is no further need to pursue PEG placement. Daughter was updated 10/15.  She did clarify that she was the eldest of the patient's children.  However the patient does have other children who are all minor.     DVT prophylaxis:   Apixaban Code Status: Full Code  Family Communication: No family at side Disposition: Not a candidate for CIR.  SNF being pursued.  Continues to require sitter.  Not yet ready for discharge.  Status is: Inpatient Remains inpatient appropriate because: Needs placement.     Medications: Scheduled:  amLODipine  5 mg Oral Daily   apixaban  5 mg Oral BID   carbamazepine  200 mg Oral BID   feeding supplement  237 mL Oral BID BM   lidocaine  1 patch Transdermal Q24H   melatonin  5 mg Oral QHS   pantoprazole  40 mg Oral Q1200   QUEtiapine  100 mg Oral QHS   QUEtiapine  25 mg Oral BID   thiamine  100 mg Oral Daily   Continuous: YQM:[id number], calcium carbonate, docusate sodium, haloperidol lactate, hydrOXYzine, ondansetron (ZOFRAN) IV, mouth rinse, polyethylene glycol  Antibiotics: Anti-infectives (From admission, onward)    Start     Dose/Rate Route Frequency Ordered Stop  10/23/21 1000  cefTRIAXone (ROCEPHIN) 2 g in sodium chloride 0.9 % 100 mL IVPB  Status:  Discontinued        2 g 200 mL/hr over 30 Minutes Intravenous Every 24 hours 10/22/21 0036 10/22/21 0037   10/23/21 1000  cefTRIAXone (ROCEPHIN) 2 g in sodium chloride 0.9 % 100 mL IVPB  Status:  Discontinued        2 g 200 mL/hr over 30 Minutes Intravenous Every 24 hours 10/22/21 0037  10/22/21 0827   10/22/21 0930  piperacillin-tazobactam (ZOSYN) IVPB 3.375 g        3.375 g 12.5 mL/hr over 240 Minutes Intravenous Every 8 hours 10/22/21 0833 10/29/21 0559   10/22/21 0915  ceFEPIme (MAXIPIME) 2 g in sodium chloride 0.9 % 100 mL IVPB  Status:  Discontinued        2 g 200 mL/hr over 30 Minutes Intravenous Every 8 hours 10/22/21 0827 10/22/21 0833       Objective:  Vital Signs  Vitals:   11/23/21 2004 11/23/21 2347 11/24/21 0449 11/24/21 0814  BP: 122/84 121/74  119/86  Pulse: (!) 103 93  (!) 108  Resp: 16 16  18   Temp: 98.1 F (36.7 C) 98.3 F (36.8 C)  98.4 F (36.9 C)  TempSrc: Oral Oral  Oral  SpO2: 98% 98%  100%  Weight:   73.6 kg   Height:        Intake/Output Summary (Last 24 hours) at 11/24/2021 1113 Last data filed at 11/23/2021 1600 Gross per 24 hour  Intake 557 ml  Output --  Net 557 ml    Filed Weights   11/20/21 0500 11/23/21 0500 11/24/21 0449  Weight: 68.9 kg 73.6 kg 73.6 kg    General appearance: Awake alert.  In no distress Resp: Clear to auscultation bilaterally.  Normal effort Cardio: S1-S2 is normal regular.  No S3-S4.  No rubs murmurs or bruit GI: Abdomen is soft.  Nontender nondistended.  Bowel sounds are present normal.  No masses organomegaly   Lab Results:  Data Reviewed: I have personally reviewed following labs and reports of the imaging studies  CBC: Recent Labs  Lab 11/20/21 0500 11/23/21 0827  WBC 10.0 9.2  HGB 12.7* 12.5*  HCT 37.1* 37.8*  MCV 93.5 95.7  PLT 253 311     Basic Metabolic Panel: Recent Labs  Lab 11/20/21 0500 11/23/21 0827  NA 139 136  K 4.1 3.9  CL 106 105  CO2 25 25  GLUCOSE 102* 78  BUN 15 19  CREATININE 0.81 0.76  CALCIUM 9.5 9.2  MG 2.0  --      GFR: Estimated Creatinine Clearance: 105.2 mL/min (by C-G formula based on SCr of 0.76 mg/dL).     Radiology Studies: No results found.     LOS: 34 days   Annalie Wenner 11/25/21 on  [email or web address]  11/24/2021, 11:13 AM

## 2021-11-24 NOTE — Progress Notes (Signed)
Patient is restless and hallucinating. He states "I am confused, why are we here, [place]".  "The patrolman said I am here another two weeks".   Patient refused wearing nonskid wear.   Attempted to reorient without success. Safety ensured.

## 2021-11-24 NOTE — Consult Note (Cosign Needed Addendum)
[facility] Face-to-Face Psychiatry Consult   Reason for Consult:'' patient with increasing agitation despite being on Seroquel Referring Physician:  Osvaldo Shipper, MD Patient Identification: [name] MRN:  [id number] Principal Diagnosis: Acute encephalopathy Diagnosis:  Principal Problem:   Acute encephalopathy Active Problems:   Fever   Pleural effusion   Deep venous thrombosis (HCC)   Acute respiratory failure with hypoxia (HCC)   Cerebrovascular accident (CVA) (HCC)   Protein-calorie malnutrition, severe   Anoxic brain injury (HCC)   Intermittent explosive disorder   Total Time spent with patient: 1 hour  Subjective:   [name] is a 56 y.o. male patient admitted with altered mental status.  Psych consult placed for patient not respond to Tegretol, continues to have episodes of agitation, Geodon x 1 today.   [name] is a 57 year old male with history of polysubstance abuse, hepatitis C, multiple venous thrombosis who presented with acute encephalopathy in the setting of an ischemic stroke that is currently stable.  Patient was found down, after a wellness check and was determined to be unresponsive; unknown downtime.  Patient reports history of cocaine use 2 previous providers earlier in this hospitalization stay, although he denies today to this provider.  Urine drug screen not available on admission(likely due to transfer from outside hospital).   MRI showed bilateral subacute infarcts, abnormal cerebellar signal, cerebellar edema.  Bilateral cerebellar and cerebral infarcts.  EEG suggestive of severe diffuse encephalopathy, nonspecific etiology. There were no seizures noted. Patient has had a number of residual changes including increased agitation and irritation and confusion.  He does deny history of violence, combativeness, aggression, and or agitation prior to his injury.   The patient was observed ambulating independently up and down the hallway with his safety sitter in  close proximity.  Patient did return back to his room promptly for psychiatric reevaluation.  He is alert and oriented x 3, able to clearly communicate, and participate in psychiatric reevaluation.  The patient is able to provide further in-depth information regarding his previous psychiatric history to include substance use, hobbies, social history .  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 The patient's verbal expression and receptive language appeared to be intact.  The patient is likely historically been very independent and he has reportedly struggled since his stroke with issues of depression and frustration.  Per patient's own report, he declines any use of cocaine, unclear as to how his urine tested positive as he was found down.  He does endorse history of alcohol use drinking approximately 2-3 beers a day for several years.  At this point patient has been in the hospital almost 5 weeks, out of acute detox window no concerns at this time.  The patient acknowledges changes in his coordination and smooth execution of motor movements likely related to his cerebellar strokes.  However, he does continue to be quite agitated by the extended stay of his hospitalization and I suspect that some of his frustration is around the greater limits put in place for mobility, safety.  However he does appear to be open to completion of a rehabilitation program, as it benefits for his long-term recovery prospects and return to work.  He also verbalizes this may take several months to a year, as previously reported by neurology.   HPI:  57 year old Male with PMHx Polysubstance abuse, Hepatitis C, who was transferred to [facility] health hospital from [facility] in [place] for the treatment of acute toxic metabolic encephalopathy. Per chart review there is a high suspicion for anoxic brain injury vs toxin induced  encephalopathy and Ischemic strokes. Psychiatric consult was requested due to patient having increased agitation despite being on Seroquel. Today, patient is alert, awake, oriented x 3, denies prior history of mental illness but admits to history of substance abuse including alcohol. He is unable to remember the sequence of events that lead to his current hospitalization but thank God that he is still alive so that he can continue to be good father to his 5 children. Patient and staff reports that he has been getting agitated very easily sometimes for no reason at all. Staff has observed periods when his mood is good, pleasant which alternates with impulsive, labile mood. Patient denies, delusions, psychosis and self harming thoughts. He agreed to take a mood stabilizer as he states that he wants to be back to his good self.    Past Psychiatric History: none reported by patient except substance abuse  Risk to Self:  denies Risk to Others:  denies Prior Inpatient Therapy:  none reported Prior Outpatient Therapy:  none  Past Medical History:  Past Medical History:  Diagnosis Date   Acid reflux    Head injury    Hepatitis C    Hiatal hernia     Past Surgical History:  Procedure Laterality Date   ACROMIO-CLAVICULAR JOINT REPAIR Right 06/28/2018   Procedure: ACROMIO-CLAVICULAR JOINT IRRIGATION AND DEBRIDEMENT;  Surgeon: [name], MD;  Location: [place];  Service: Orthopedics;  Laterality: Right;   FACIAL FRACTURE SURGERY     IRRIGATION AND DEBRIDEMENT SHOULDER Right 06/28/2018   Procedure: IRRIGATION AND DEBRIDEMENT SHOULDER;  Surgeon: [name], MD;  Location: [place];  Service: Orthopedics;  Laterality: Right;   KNEE ARTHROSCOPY Left 06/23/2018   Procedure: LEFT KNEE ARTHROSCOPY KNEE I&D.;  Surgeon: [name], MD;  Location: [place];  Service: Orthopedics;  Laterality: Left;   Family History:  Family History  Problem Relation Age of Onset   Diabetes Mother     Hypertension Mother    Hypertension Father    Family Psychiatric  History:   Social History:  Social History   Substance and Sexual Activity  Alcohol Use Yes   Comment: socially      Social History   Substance and Sexual Activity  Drug Use Not Currently   Types: Cocaine   Comment: states its "been a long time"     Social History   Socioeconomic History   Marital status: Single    Spouse name: Not on file   Number of children: Not on file   Years of education: Not on file   Highest education level: Not on file  Occupational History   Not on file  Tobacco Use   Smoking status: Every Day    Packs/day: 1.00    Years: 20.00    Total pack years: 20.00    Types: Cigarettes   Smokeless tobacco: Never  Substance and Sexual Activity   Alcohol use: Yes    Comment: socially    Drug use: Not Currently    Types: Cocaine    Comment: states its "been a long time"    Sexual activity: Not Currently  Other Topics Concern   Not on file  Social History Narrative   Not on file   Social Determinants of Health   Financial Resource Strain: Not on file  Food Insecurity: Food Insecurity Present (11/16/2021)  Hunger Vital Sign    Worried About Running Out of Food in the Last Year: Often true    Ran Out of Food in the Last Year: Often true  Transportation Needs: Unmet Transportation Needs (11/16/2021)   PRAPARE - Administrator, Civil ServiceTransportation    Lack of Transportation (Medical): Yes    Lack of Transportation (Non-Medical): Yes  Physical Activity: Not on file  Stress: Not on file  Social Connections: Not on file   Additional Social History:    Allergies:  No Known Allergies  Labs:  Results for orders placed or performed during the hospital encounter of 10/21/21 (from the past 48 hour(s))  CBC     Status: Abnormal   Collection Time: 11/23/21  8:27 AM  Result Value Ref Range   WBC 9.2 4.0 - 10.5 K/uL   RBC 3.95 (L) 4.22 - 5.81 MIL/uL   Hemoglobin 12.5 (L) 13.0 - 17.0 g/dL   HCT 16.137.8 (L)  09.639.0 - 52.0 %   MCV 95.7 80.0 - 100.0 fL   MCH 31.6 26.0 - 34.0 pg   MCHC 33.1 30.0 - 36.0 g/dL   RDW 04.513.5 40.911.5 - 81.115.5 %   Platelets 311 150 - 400 K/uL   nRBC 0.0 0.0 - 0.2 %    Comment: Performed at [facility], [address] [place], [place] [address]  Comprehensive metabolic panel     Status: Abnormal   Collection Time: 11/23/21  8:27 AM  Result Value Ref Range   Sodium 136 135 - 145 mmol/L   Potassium 3.9 3.5 - 5.1 mmol/L   Chloride 105 98 - 111 mmol/L   CO2 25 22 - 32 mmol/L   Glucose, Bld 78 70 - 99 mg/dL    Comment: Glucose reference range applies only to samples taken after fasting for at least 8 hours.   BUN 19 6 - 20 mg/dL   Creatinine, Ser 2.950.76 0.61 - 1.24 mg/dL   Calcium 9.2 8.9 - 62.110.3 mg/dL   Total Protein 6.6 6.5 - 8.1 g/dL   Albumin 3.5 3.5 - 5.0 g/dL   AST 23 15 - 41 U/L   ALT 44 0 - 44 U/L   Alkaline Phosphatase 92 38 - 126 U/L   Total Bilirubin 0.1 (L) 0.3 - 1.2 mg/dL   GFR, Estimated >30>60 >86>60 mL/min    Comment: (NOTE) Calculated using the CKD-EPI Creatinine Equation (2021)    Anion gap 6 5 - 15    Comment: Performed at [facility], [address] [place], [place] [address]    Current Facility-Administered Medications  Medication Dose Route Frequency Provider Last Rate Last Admin   acetaminophen (TYLENOL) tablet 650 mg  650 mg Oral Q6H PRN Opyd, Lavone Neriimothy S, MD   650 mg at 11/22/21 1542   amLODipine (NORVASC) tablet 5 mg  5 mg Oral Daily Pahwani, Daleen Boavi, MD   5 mg at 11/24/21 1117   apixaban (ELIQUIS) tablet 5 mg  5 mg Oral BID Azucena FallenLancaster, William C, MD   5 mg at 11/24/21 1115   calcium carbonate (TUMS - dosed in mg elemental calcium) chewable tablet 200 mg of elemental calcium  1 tablet Oral BID PRN Azucena FallenLancaster, William C, MD   200 mg of elemental calcium at 11/21/21 1943   carbamazepine (TEGRETOL) tablet 200 mg  200 mg Oral BID Akintayo, Mojeed, MD   200 mg at 11/24/21 1115   docusate sodium (COLACE) capsule 100 mg  100 mg Oral BID PRN Opyd,  Lavone Neriimothy S, MD  feeding supplement (ENSURE ENLIVE / ENSURE PLUS) liquid 237 mL  237 mL Oral BID BM Osvaldo Shipper, MD   237 mL at 11/24/21 1117   haloperidol lactate (HALDOL) injection 2 mg  2 mg Intramuscular Q6H PRN Osvaldo Shipper, MD   2 mg at 11/23/21 1340   hydrOXYzine (ATARAX) tablet 25 mg  25 mg Oral Q6H PRN Azucena Fallen, MD   25 mg at 11/24/21 1231   lidocaine (LIDODERM) 5 % 1 patch  1 patch Transdermal Q24H Lyda Perone M, DO   1 patch at 11/22/21 0518   melatonin tablet 5 mg  5 mg Oral QHS Opyd, Lavone Neri, MD   5 mg at 11/23/21 2359   ondansetron (ZOFRAN-ODT) disintegrating tablet 4 mg  4 mg Oral Q8H PRN Osvaldo Shipper, MD   4 mg at 11/24/21 1231   Oral care mouth rinse  15 mL Mouth Rinse PRN Azucena Fallen, MD       pantoprazole (PROTONIX) EC tablet 40 mg  40 mg Oral Q1200 Osvaldo Shipper, MD   40 mg at 11/24/21 1024   polyethylene glycol (MIRALAX / GLYCOLAX) packet 17 g  17 g Oral Daily PRN Duayne Cal, NP   17 g at 11/09/21 2237   QUEtiapine (SEROQUEL) tablet 100 mg  100 mg Oral QHS Akintayo, Mojeed, MD   100 mg at 11/23/21 2359   QUEtiapine (SEROQUEL) tablet 25 mg  25 mg Oral BID Maryagnes Amos, [name]   25 mg at 11/24/21 1115   thiamine (VITAMIN B1) tablet 100 mg  100 mg Oral Daily Opyd, Lavone Neri, MD   100 mg at 11/24/21 1115    Musculoskeletal: Strength & Muscle Tone: within normal limits Gait & Station: normal Patient leans: N/A     Psychiatric Specialty Exam:  Presentation  General Appearance:  Appropriate for Environment  Eye Contact: Good  Speech: Clear and Coherent  Speech Volume: Normal  Handedness: Right   Mood and Affect  Mood: Irritable  Affect: Labile; Tearful   Thought Process  Thought Processes: Linear  Descriptions of Associations:Intact  Orientation:Full (Time, Place and Person)  Thought Content:Logical; Rumination  History of Schizophrenia/Schizoaffective disorder:No data recorded Duration of  Psychotic Symptoms:No data recorded Hallucinations:No data recorded  Ideas of Reference:None  Suicidal Thoughts:No data recorded  Homicidal Thoughts:No data recorded   Sensorium  Memory: Immediate Fair; Recent Fair; Remote Fair  Judgment: Fair  Insight: Fair   Art therapist  Concentration: Fair  Attention Span: Fair  Recall: [employer] of Knowledge: Fair  Language: Good   Psychomotor Activity  Psychomotor Activity: No data recorded   Assets  Assets: Communication Skills; Desire for Improvement   Sleep  Sleep: No data recorded   Physical Exam: Physical Exam Vitals and nursing note reviewed.  Constitutional:      Appearance: Normal appearance. He is normal weight.  Skin:    Capillary Refill: Capillary refill takes less than 2 seconds.  Neurological:     General: No focal deficit present.     Mental Status: He is alert and oriented to person, place, and time.  Psychiatric:        Attention and Perception: Attention and perception normal.        Mood and Affect: Mood normal.        Speech: Speech is delayed.        Behavior: Behavior normal. Behavior is cooperative.        Thought Content: Thought content normal.  Cognition and Memory: Memory is impaired (improving).        Judgment: Judgment is impulsive.    Review of Systems  Constitutional: Negative.   Neurological:  Positive for tingling, sensory change and speech change. Negative for focal weakness, seizures and weakness. Dizziness: restlessness. Psychiatric/Behavioral:  Negative for depression, hallucinations, memory loss, substance abuse and suicidal ideas. The patient is not nervous/anxious and does not have insomnia.    Blood pressure 119/84, pulse (!) 109, temperature 98.2 F (36.8 C), temperature source Oral, resp. rate 18, height 5\' 10"  (1.778 m), weight 73.6 kg, SpO2 100 %. Body mass index is 23.28 kg/m.  Treatment Plan Summary: Plan/Recommendations: -Continue  Seroquel 100 mg p.o. nightly.  Will add Seroquel 25 mg p.o. twice daily, considering patient's confusion and behaviors increased during the evening.  Will adjust scheduling for 800and 1600. -Continue carbamazepine 200 mg bid for agitation and mood stabilization -Continue Haldol 0.5 mg BID as needed for agitation, limit as needed use only severe agitation.  As patient has endorsed restlessness (akathisia).  -Consider referral to Neurologist to rule out traumatic brain injury -Continue Hydroxyzine 25 mg q6h prn for anxiety/gitation   Psychiatry will continue to follow, until appropriate medication regimen is in place to manage symptoms associated with agitation, mood stabilization, cognitive impairment.  Also suspect majority of patient's symptoms are related to Multiple neurological insults receive due to this injury.  Disposition: No evidence of imminent risk to self or others at present.   Patient does not meet criteria for psychiatric inpatient admission. Supportive therapy provided about ongoing stressors.   , [name] 11/24/2021 1:02 PM

## 2021-11-24 NOTE — Progress Notes (Signed)
Mobility Specialist: Progress Note   11/24/21 1427  Mobility  Activity Ambulated with assistance in hallway (Ambulated outside)  Level of Assistance Contact guard assist, steadying assist  Assistive Device None  Distance Ambulated (ft) 800 ft  Activity Response Tolerated well  Mobility Referral Yes  $Mobility charge 1 Mobility   Pt received sitting EOB and agreeable to mobility. Wheeled pt outside in wheelchair for mobility session. Pt was happy to get off the unit and did well with outside ambulation. Pt wheeled back to the room after session with NT present in the room.   [place] [name] Mobility Specialist Secure Chat Only

## 2021-11-25 DIAGNOSIS — G9340 Encephalopathy, unspecified: Secondary | ICD-10-CM | POA: Diagnosis not present

## 2021-11-25 NOTE — Progress Notes (Signed)
[facility] PROGRESS NOTE   [name] URK:[id number] DOB: 27-Aug-1965 DOA: 10/21/2021  PCP: [name], MD  Brief History/Interval Summary: 56 year old WM PMHx Polysubstance abuse, Hepatitis C,    Presented to [facility] in [place] on 9/6 after being found down.  He was last known well at approximately 13:00 hours that day however EMS was called for wellness check at some time later on.  He was found to be down and unresponsive.  On EMS evaluation the patient was apneic with thready pulse.  He did not responded to intranasal Narcan.  He was bagged in route to the emergency department and subsequently intubated on arrival to the ED. CT head demonstrated cerebral edema without loss of white matter differentiation. He was transferred to [facility] for MRI. On 9/11 he was noted to have upper extremity edema on the right. Ultrasound demonstrated acute thrombosis in the mid and distal basilic vein. Hospital course also complicated by aspiration pneumonia. Subsequently transferred to [facility] on 9/13.   Events:  9/6 found down > present to [facility] ED. CT with bilateral cerebellar edema. 9/7 repeat CT normal Unable to be weaned from sedation 9/11 RUE DVT started on tx dose lovenox 9/13 transfer to [facility] for MRI 9/14 chest tube placed with intrapleural fibrinolytic administration 9/16 chest tube removed 9/17 patient extubated, remained agitated and started on precedex 9/19 Cortrak placed  10/4 Attempting to wean cortrak (increase PO intake more liberally over the next 24h per SLP recs) - once cortrak removed can start having reasonable discussion on disposition.  10/5 Cortrak accidentally removed - advancing diet as tolerated 10/6 - overnight agitation - haldol x1 given - continues to complain of "having a panic attack" but then describes reflux symptoms. 10/7-patient continues to have word finding difficulty, received benzos overnight as patient reportedly requested  outpatient Valium which she has not been on and does not currently take.  We will attempt to limit CNS depressing medications given his ongoing mental status changes from previous baseline secondary to below. Palliative will follow along from a distance which is certainly reasonable given his stable nature. 10/8 - diet continues to improve, initiate hydroxyzine PRN 10/9 -patient continues to progress, remains impulsive, difficult to reorient but able to ambulate somewhat today with physical therapy, recommendations pending but certainly approaching safe disposition to facility.  Unsafe discharge home alone or with family given need for 24-hour care and monitoring given his current mental status. 10/10 Patient now being evaluated for CIR placement - not appropriate for this level of care per CIR - (remains very impulsive and requires 24h monitoring but more redirectable as of late). PO intake markedly improving - no indication for PEG tube at this time (ate 100% of breakfast/lunch with minimal assistance). 10/15 Psych re-consulted for increasing agitation - recommending hydroxyzine, haldol, carbamazepine, seroquel  Subjective/Interval History: Patient noted to be pleasant this morning.  Denies any pain.   Assessment/Plan:  Acute toxic metabolic encephalopathy with concurrent ischemic stroke, stablizing, POA: -In the setting of acute stroke/toxic encephalopathy versus possible previous anoxic event as patient was found down in the field of unclear etiology and duration -with cocaine positive UDS Seen by neurology.  Patient was given high-dose thiamine. Now on once a day thiamine. -Per neurology high suspicion for anoxic brain injury versus toxin induced encephalopathy and ischemic strokes given negative EEG. -Avoid benzodiazepine as much as possible - Continue hydroxyzine/haldol/carbamazepine/seroquel per Psych (re-consulted 10/15 for worsening agitation)   Multifocal cerebral infarcts: -Neurology  recommended to  start aspirin once patient is off of anticoagulation (presumed DVT).  He will come off anticoagulation on December 11. -High risk for falls, discussed fall risk with family, safe disposition is paramount as below   Acute hypoxic respiratory failure secondary to aspiration pneumonia, resolved Right-sided aspiration pneumonia with parapneumonic effusion: -Status post chest tube placement with intrapleural lytics on 9/14, chest tube removed 9/16. -Completed 7 days of Zosyn -Intubated on admission outside hospital 9/6. -Extubated 9/17. Respiratory status is stable.   Superficial vein thrombosis 9/11 Right basilic vein thrombus -Due to increase risk for progression of thrombosis patient was started on Lovenox per previous discussion with oncology (Dr. Clelia Croft). Patient has been transitioned to apixaban. Plan for 3 months of treatment which will be until December 11.   Dysphagia/nutrition:  Initially was found to be with significant oropharyngeal dysphagia.  Required tube feedings.  PEG was being considered however then his oral intake started improving.  No indication for PEG tube at this time.   Thrombocytosis: Resolved  Normocytic anemia Slight drop in hemoglobin was noted.  Hemoglobin noted to be stable this morning.  Check every few days.   Mild liver dysfunction/abnormal LFTs History of hepatitis C, improved.   Diarrhea, resolved:   Severe protein calorie malnutrition Nutrition Problem: Severe Malnutrition Etiology: social / environmental circumstances (drug abuse) Signs/Symptoms: severe fat depletion, severe muscle depletion  Goals of care  Previous providers have had lengthy conversation with the patient's daughter. Family expectations is that patient will continue to recover back to previous baseline.  This was felt to be unlikely.  -Prior disposition was somewhat limited by NG tube placement with prior concern for possible PEG tube placement, now that dietary  intake is markedly improving there is no further need to pursue PEG placement. Daughter was updated 10/15.  She did clarify that she was the eldest of the patient's children.  However the patient does have other children who are all minor.     DVT prophylaxis:   Apixaban Code Status: Full Code  Family Communication: No family at side Disposition: Not a candidate for CIR.  SNF being pursued.  Continues to require sitter. Not yet ready for discharge.  Status is: Inpatient Remains inpatient appropriate because: Unsafe discharge, needs placement.   Medications: Scheduled:  amLODipine  5 mg Oral Daily   apixaban  5 mg Oral BID   carbamazepine  200 mg Oral BID   feeding supplement  237 mL Oral BID BM   lidocaine  1 patch Transdermal Q24H   melatonin  5 mg Oral QHS   pantoprazole  40 mg Oral Q1200   QUEtiapine  100 mg Oral QHS   QUEtiapine  25 mg Oral BID   thiamine  100 mg Oral Daily   Continuous: [id number], calcium carbonate, docusate sodium, haloperidol lactate, hydrOXYzine, ondansetron, mouth rinse, polyethylene glycol  Antibiotics: Anti-infectives (From admission, onward)    Start     Dose/Rate Route Frequency Ordered Stop   10/23/21 1000  cefTRIAXone (ROCEPHIN) 2 g in sodium chloride 0.9 % 100 mL IVPB  Status:  Discontinued        2 g 200 mL/hr over 30 Minutes Intravenous Every 24 hours 10/22/21 0036 10/22/21 0037   10/23/21 1000  cefTRIAXone (ROCEPHIN) 2 g in sodium chloride 0.9 % 100 mL IVPB  Status:  Discontinued        2 g 200 mL/hr over 30 Minutes Intravenous Every 24 hours 10/22/21 0037 10/22/21 0827   10/22/21 0930  piperacillin-tazobactam (ZOSYN) IVPB 3.375 g  3.375 g 12.5 mL/hr over 240 Minutes Intravenous Every 8 hours 10/22/21 0833 10/29/21 0559   10/22/21 0915  ceFEPIme (MAXIPIME) 2 g in sodium chloride 0.9 % 100 mL IVPB  Status:  Discontinued        2 g 200 mL/hr over 30 Minutes Intravenous Every 8 hours 10/22/21 0827 10/22/21 0833        Objective:  Vital Signs  Vitals:   11/24/21 2320 11/25/21 0102 11/25/21 0500 11/25/21 0504  BP:  115/73  (!) 139/94  Pulse:  97  88  Resp:  15  15  Temp: 98.6 F (37 C)   97.8 F (36.6 C)  TempSrc: Oral   Oral  SpO2:  99%  98%  Weight:   73.7 kg   Height:        Intake/Output Summary (Last 24 hours) at 11/25/2021 0734 Last data filed at 11/24/2021 1705 Gross per 24 hour  Intake 1660 ml  Output --  Net 1660 ml    Filed Weights   11/23/21 0500 11/24/21 0449 11/25/21 0500  Weight: 73.6 kg 73.6 kg 73.7 kg    General appearance: Awake alert.  In no distress Resp: Clear to auscultation bilaterally.  Normal effort Cardio: S1-S2 is normal regular.  No S3-S4.  No rubs murmurs or bruit GI: Abdomen is soft.  Nontender nondistended.  Bowel sounds are present normal.  No masses organomegaly   Lab Results:  Data Reviewed: I have personally reviewed following labs and reports of the imaging studies  CBC: Recent Labs  Lab 11/20/21 0500 11/23/21 0827  WBC 10.0 9.2  HGB 12.7* 12.5*  HCT 37.1* 37.8*  MCV 93.5 95.7  PLT 253 311     Basic Metabolic Panel: Recent Labs  Lab 11/20/21 0500 11/23/21 0827  NA 139 136  K 4.1 3.9  CL 106 105  CO2 25 25  GLUCOSE 102* 78  BUN 15 19  CREATININE 0.81 0.76  CALCIUM 9.5 9.2  MG 2.0  --      GFR: Estimated Creatinine Clearance: 105.2 mL/min (by C-G formula based on SCr of 0.76 mg/dL).     Radiology Studies: No results found.     LOS: 18 days   Little Ishikawa  [facility] Pager on [email or web address]  11/25/2021, 7:34 AM

## 2021-11-25 NOTE — Plan of Care (Signed)
°  Problem: Clinical Measurements: °Goal: Will remain free from infection °Outcome: Progressing °  °Problem: Nutrition: °Goal: Adequate nutrition will be maintained °Outcome: Progressing °  °Problem: Coping: °Goal: Level of anxiety will decrease °Outcome: Progressing °  °

## 2021-11-25 NOTE — Consult Note (Signed)
[facility] Face-to-Face Psychiatry Consult   Reason for Consult:'' patient with increasing agitation despite being on Seroquel Referring Physician:  Osvaldo Shipper, MD Patient Identification: [name] MRN:  [id number] Principal Diagnosis: Acute encephalopathy Diagnosis:  Principal Problem:   Acute encephalopathy Active Problems:   Fever   Pleural effusion   Deep venous thrombosis (HCC)   Acute respiratory failure with hypoxia (HCC)   Cerebrovascular accident (CVA) (HCC)   Protein-calorie malnutrition, severe   Anoxic brain injury (HCC)   Intermittent explosive disorder   Total Time spent with patient: 1 hour  Subjective:   [name] is a 56 y.o. male patient admitted with altered mental status.  Psych consult placed for patient not respond to Tegretol, continues to have episodes of agitation, Geodon x 1 today.   [name] is a 56 year old male with history of polysubstance abuse, hepatitis C, multiple venous thrombosis who presented with acute encephalopathy in the setting of an ischemic stroke that is currently stable.  Patient was found down, after a wellness check and was determined to be unresponsive; unknown downtime.  Patient reports history of cocaine use 2 previous providers earlier in this hospitalization stay, although he denies today to this provider.  Urine drug screen not available on admission(likely due to transfer from outside hospital).   MRI showed bilateral subacute infarcts, abnormal cerebellar signal, cerebellar edema.  Bilateral cerebellar and cerebral infarcts.  EEG suggestive of severe diffuse encephalopathy, nonspecific etiology. There were no seizures noted. Patient has had a number of residual changes including increased agitation and irritation and confusion.  He does deny history of violence, combativeness, aggression, and or agitation prior to his injury.   On evaluation patient is alert and oriented, calm and cooperative, very pleasant upon approach.   Patient continues to have recollection of stroke causing his loss of consciousness and altered mental status, leading to admission.  Patient does present with some obvious mild cognitive impairments, as he continues to be very repetitive, forgetful (unaware that he stated facts previously), forgetting important events.  He is able to follow certain cues and problems ; such as reading the day off of the board.  Patient is observed to read the date backwards instead of 11/25/21;, he reads 23/18/and unable to identify the number team.  He furthermore is unable to correlate the 10th month to October.  As several times during his evaluation, he is noted to remove his glasses and and placed them back on his face.  He then refers to his pack of cards, stating he has done enough memory testing today with therapy, and he prefers to not do anything else.  He does seem to enjoy having his safety sitter is present.   He endorses a history of hallucinations and delusions, previously stabilized on Seroquel 100mg  po nightly.  Patient was recently started on Seroquel 25 mg p.o. twice daily, in which he continues to show modest improvement daily.  He denies any side effects at this time, and or adverse reactions.  He denies any increase in restlessness, agitation, and or aggression.  He continues to be able to complete his ADLs independently of nursing staff. At the present time he denies any suicidal ideations, homicidal ideations, and or nonsuicidal self-injurious behavior.  Patient denies any auditory and/or visual hallucinations, does not appear to be responding to internal or external stimuli.  There is no evidence of delusional thought content and patient appears to answer all questions appropriately.   On today's reassessment patient did not present  with any focal neurological deficit, he was able to speak clearly, follow commands, and denies weakness.   At this current time, patient's pre-existing condition that was  substantially aggravated prior to this admission has returned to previous level.  Patient further states that his current health at this present time seems to be normal as he is noted to feel better and like his self again.  He does continue to endorse some mental fog, overall assessing is doing better.  He does present with decrease in attention and concentration, he is alert and oriented x 4, shows no short-term memory deficit.  Although he is unable to recall his previous actions that resulted in consult, to include confabulation and his delusions.  He is not lethargic on today's exam, shows linear thought processes, and answers all questions appropriately.  At this time it is felt that patient has returned to psychiatric baseline, and isstable. Marland Kitchen He denies suicidal ideation, homicidal ideation, and or auditory or visual hallucinations      HPI:  56 year old Male with PMHx Polysubstance abuse, Hepatitis C, who was transferred to [facility] hospital from [facility] in [place] for the treatment of acute toxic metabolic encephalopathy. Per chart review there is a high suspicion for anoxic brain injury vs toxin induced encephalopathy and Ischemic strokes. Psychiatric consult was requested due to patient having increased agitation despite being on Seroquel. Today, patient is alert, awake, oriented x 3, denies prior history of mental illness but admits to history of substance abuse including alcohol. He is unable to remember the sequence of events that lead to his current hospitalization but thank God that he is still alive so that he can continue to be good father to his 5 children. Patient and staff reports that he has been getting agitated very easily sometimes for no reason at all. Staff has observed periods when his mood is good, pleasant which alternates with impulsive, labile mood. Patient denies, delusions, psychosis and self harming thoughts. He agreed to take a mood stabilizer as he states  that he wants to be back to his good self.    Past Psychiatric History: none reported by patient except substance abuse  Risk to Self:  denies Risk to Others:  denies Prior Inpatient Therapy:  none reported Prior Outpatient Therapy:  none  Past Medical History:  Past Medical History:  Diagnosis Date   Acid reflux    Head injury    Hepatitis C    Hiatal hernia     Past Surgical History:  Procedure Laterality Date   ACROMIO-CLAVICULAR JOINT REPAIR Right 06/28/2018   Procedure: ACROMIO-CLAVICULAR JOINT IRRIGATION AND DEBRIDEMENT;  Surgeon: [name], MD;  Location: [place];  Service: Orthopedics;  Laterality: Right;   FACIAL FRACTURE SURGERY     IRRIGATION AND DEBRIDEMENT SHOULDER Right 06/28/2018   Procedure: IRRIGATION AND DEBRIDEMENT SHOULDER;  Surgeon: [name], MD;  Location: [place];  Service: Orthopedics;  Laterality: Right;   KNEE ARTHROSCOPY Left 06/23/2018   Procedure: LEFT KNEE ARTHROSCOPY KNEE I&D.;  Surgeon: [name], MD;  Location: [place];  Service: Orthopedics;  Laterality: Left;   Family History:  Family History  Problem Relation Age of Onset   Diabetes Mother    Hypertension Mother    Hypertension Father    Family Psychiatric  History:   Social History:  Social History   Substance and Sexual Activity  Alcohol Use Yes   Comment: socially      Social History   Substance  and Sexual Activity  Drug Use Not Currently   Types: Cocaine   Comment: states its "been a long time"     Social History   Socioeconomic History   Marital status: Single    Spouse name: Not on file   Number of children: Not on file   Years of education: Not on file   Highest education level: Not on file  Occupational History   Not on file  Tobacco Use   Smoking status: Every Day    Packs/day: 1.00    Years: 20.00    Total pack years: 20.00    Types: Cigarettes   Smokeless tobacco: Never  Substance and Sexual Activity   Alcohol use: Yes     Comment: socially    Drug use: Not Currently    Types: Cocaine    Comment: states its "been a long time"    Sexual activity: Not Currently  Other Topics Concern   Not on file  Social History Narrative   Not on file   Social Determinants of Health   Financial Resource Strain: Not on file  Food Insecurity: Food Insecurity Present (11/16/2021)   Hunger Vital Sign    Worried About Running Out of Food in the Last Year: Often true    Ran Out of Food in the Last Year: Often true  Transportation Needs: Unmet Transportation Needs (11/16/2021)   PRAPARE - Administrator, Civil Service (Medical): Yes    Lack of Transportation (Non-Medical): Yes  Physical Activity: Not on file  Stress: Not on file  Social Connections: Not on file   Additional Social History:    Allergies:  No Known Allergies  Labs: No results found for this or any previous visit (from the past 48 hour(s)).   Current Facility-Administered Medications  Medication Dose Route Frequency Provider Last Rate Last Admin   acetaminophen (TYLENOL) tablet 650 mg  650 mg Oral Q6H PRN Opyd, Lavone Neri, MD   650 mg at 11/22/21 1542   amLODipine (NORVASC) tablet 5 mg  5 mg Oral Daily Pahwani, Daleen Bo, MD   5 mg at 11/25/21 0805   apixaban (ELIQUIS) tablet 5 mg  5 mg Oral BID Azucena Fallen, MD   5 mg at 11/25/21 0805   calcium carbonate (TUMS - dosed in mg elemental calcium) chewable tablet 200 mg of elemental calcium  1 tablet Oral BID PRN Azucena Fallen, MD   200 mg of elemental calcium at 11/24/21 1807   carbamazepine (TEGRETOL) tablet 200 mg  200 mg Oral BID Akintayo, Mojeed, MD   200 mg at 11/25/21 0804   docusate sodium (COLACE) capsule 100 mg  100 mg Oral BID PRN Briscoe Deutscher, MD   100 mg at 11/24/21 1807   feeding supplement (ENSURE ENLIVE / ENSURE PLUS) liquid 237 mL  237 mL Oral BID BM Osvaldo Shipper, MD   237 mL at 11/25/21 0805   haloperidol lactate (HALDOL) injection 2 mg  2 mg Intramuscular Q6H PRN  Osvaldo Shipper, MD   2 mg at 11/23/21 1340   hydrOXYzine (ATARAX) tablet 25 mg  25 mg Oral Q6H PRN Azucena Fallen, MD   25 mg at 11/25/21 1337   lidocaine (LIDODERM) 5 % 1 patch  1 patch Transdermal Q24H Lyda Perone M, DO   1 patch at 11/25/21 0514   melatonin tablet 5 mg  5 mg Oral QHS Opyd, Lavone Neri, MD   5 mg at 11/24/21 2227   ondansetron (ZOFRAN-ODT) disintegrating  tablet 4 mg  4 mg Oral Q8H PRN Osvaldo Shipper, MD   4 mg at 11/24/21 1231   Oral care mouth rinse  15 mL Mouth Rinse PRN Azucena Fallen, MD       pantoprazole (PROTONIX) EC tablet 40 mg  40 mg Oral Q1200 Osvaldo Shipper, MD   40 mg at 11/25/21 0804   polyethylene glycol (MIRALAX / GLYCOLAX) packet 17 g  17 g Oral Daily PRN Duayne Cal, NP   17 g at 11/24/21 1901   QUEtiapine (SEROQUEL) tablet 100 mg  100 mg Oral QHS Akintayo, Mojeed, MD   100 mg at 11/24/21 2227   QUEtiapine (SEROQUEL) tablet 25 mg  25 mg Oral BID Maryagnes Amos, [name]   25 mg at 11/25/21 0805   thiamine (VITAMIN B1) tablet 100 mg  100 mg Oral Daily Opyd, Lavone Neri, MD   100 mg at 11/25/21 0805    Musculoskeletal: Strength & Muscle Tone: within normal limits Gait & Station: normal Patient leans: N/A     Psychiatric Specialty Exam:  Presentation  General Appearance:  Appropriate for Environment  Eye Contact: Good  Speech: Clear and Coherent  Speech Volume: Normal  Handedness: Right   Mood and Affect  Mood: Pleasant  Affect: WNL  Thought Process  Thought Processes: Linear  Descriptions of Associations:Intact  Orientation:Full (Time, Place and Person)  Thought Content:Logical; Rumination  History of Schizophrenia/Schizoaffective disorder:No data recorded Duration of Psychotic Symptoms:No data recorded Hallucinations:No data recorded  Ideas of Reference:None  Suicidal Thoughts:None  Homicidal Thoughts:None   Sensorium  Memory: Immediate Fair; Recent Fair; Remote  Fair  Judgment: Fair  Insight: Fair   Art therapist  Concentration: Fair  Attention Span: Fair  Recall: [employer] of Knowledge: Fair  Language: Good   Psychomotor Activity  Psychomotor Activity: No data recorded   Assets  Assets: Communication Skills; Desire for Improvement   Sleep  Sleep: No data recorded   Physical Exam: Physical Exam Vitals and nursing note reviewed.  Constitutional:      Appearance: Normal appearance. He is normal weight.  Skin:    Capillary Refill: Capillary refill takes less than 2 seconds.  Neurological:     General: No focal deficit present.     Mental Status: He is alert and oriented to person, place, and time.  Psychiatric:        Attention and Perception: Attention and perception normal.        Mood and Affect: Mood normal.        Speech: Speech is delayed.        Behavior: Behavior normal. Behavior is cooperative.        Thought Content: Thought content normal.        Cognition and Memory: Memory is impaired (improving).        Judgment: Judgment is impulsive.    Review of Systems  Constitutional: Negative.   Eyes:  Positive for blurred vision.  Neurological:  Positive for sensory change and speech change. Negative for tingling, focal weakness, seizures and weakness.  Psychiatric/Behavioral:  Negative for depression, hallucinations, memory loss, substance abuse and suicidal ideas. The patient is not nervous/anxious and does not have insomnia.    Blood pressure 126/86, pulse 98, temperature 98 F (36.7 C), temperature source Oral, resp. rate 18, height 5\' 10"  (1.778 m), weight 73.7 kg, SpO2 99 %. Body mass index is 23.31 kg/m.  Treatment Plan Summary: Plan/Recommendations: -Continue Seroquel 100 mg p.o. nightly.  Will continue Seroquel 25  mg p.o. twice daily, considering patient's confusion and behaviors increased during the evening.  Will adjust scheduling for 800and 1600. -D/C carbamazepine 200 mg bid, due  to drug to drug interaction -Continue Haldol 0.5 mg BID as needed for agitation, limit as needed use only severe agitation.  As patient has endorsed restlessness (akathisia).  -Consider referral to Neurologist to rule out traumatic brain injury -Continue Hydroxyzine 25 mg q6h prn for anxiety/gitation   Psychiatry will sign off at this time.  Patient now on appropriate medication regimen, symptoms have improved, no further acute psychiatric needs have been identified that warrant ongoing inpatient psychiatric consultation.  Thank you for this interesting psychiatric consult please reconsult if the symptoms worsen.  Disposition: No evidence of imminent risk to self or others at present.   Patient does not meet criteria for psychiatric inpatient admission. Supportive therapy provided about ongoing stressors.   Maryagnes Amosakia S Starkes-Perry, [name] 11/25/2021 3:17 PM

## 2021-11-25 NOTE — Progress Notes (Signed)
Mobility Specialist: Progress Note   11/25/21 1418  Mobility  Activity Ambulated with assistance in hallway (Outside)  Level of Assistance Contact guard assist, steadying assist  Assistive Device None  Distance Ambulated (ft) 500 ft  Activity Response Tolerated well  Mobility Referral Yes  $Mobility charge 1 Mobility   Received pt sitting EOB having no complaints and agreeable to mobility. Pt was asymptomatic throughout ambulation and returned to room w/o fault. Left in bed w/ call bell in reach and all needs met.  [name] Leath Mobility Specialist Secure Chat Only

## 2021-11-25 NOTE — Progress Notes (Addendum)
Speech Language Pathology Treatment: Cognitive-Linquistic  Patient Details Name: [name] MRN: [id number] DOB: [birth year] Today's Date: 11/25/2021 Time: 0923-3007 SLP Time Calculation (min) (ACUTE ONLY): 31 min  Assessment / Plan / Recommendation Clinical Impression  Pt initially cheerful today. Still deleting or avoiding nouns and names. Often saying generalized terms such as "that girl" or "this thing" though when questioned he is able to more specifically name objects as needed. Attempted some basic reading tasks as pt kept saying he needed glasses. SLP got some 2.5 readers, which he said was what he typically used. Before putting on the readers, he could name the suit on 3/3 cards, but incorrectly read/named all the numbers. Placing glasses yielded the same result. Pt also could not read large print on the paper. If one eye was covered his ability to read numbers improved though he would not continue the task, said it made his head hurt. SLP attempted a way finding and memory task, pointing out room number asking him to repeat it and go for a walk. Pt able to find his way back to room and located room number and correct room with question cues. Pt was able to be redirected to tasks, but he consistently perseverated on a confabulated event. Pt was not responsive to questions from SLP to guide pt in reasoning through this. He was somewhat paranoid, seemed to feel that staff was watching him and I was his ally. Wanted to speak to me out of earshot of others. Pts behavior is impacting his ability to participate in therapeutic activities. Pt is ready for OP f/u with SLP.    HPI HPI: 56 year old male with past medical history significant for polysubstance abuse, hepatitis C, and GERD who presented to [facility] in [name] on 9/6 after being found down. He was bagged in route to the emergency department at which he arrived around 2300 hrs.  He was intubated immediately upon arrival to the ED  9/6.  CT of the head demonstrated cerebral edema without loss of white matter differentiation.  MRI shows Multifocal, primarily small acute infarcts involving bilateral cerebral hemispheres.. Extubated 9/18.      SLP Plan  Continue with current plan of care      Recommendations for follow up therapy are one component of a multi-disciplinary discharge planning process, led by the attending physician.  Recommendations may be updated based on patient status, additional functional criteria and insurance authorization.    Recommendations                   Plan: Continue with current plan of care           Ringo Sherod, Katherene Ponto  11/25/2021, 11:44 AM

## 2021-11-26 DIAGNOSIS — G9340 Encephalopathy, unspecified: Secondary | ICD-10-CM | POA: Diagnosis not present

## 2021-11-26 NOTE — Progress Notes (Signed)
[facility] PROGRESS NOTE   [name] CVE:[id number] DOB: 1965-12-24 DOA: 10/21/2021  PCP: [name], MD  Brief History/Interval Summary: 56 year old WM PMHx Polysubstance abuse, Hepatitis C,    Presented to [facility] in [place] on 9/6 after being found down.  He was last known well at approximately 13:00 hours that day however EMS was called for wellness check at some time later on.  He was found to be down and unresponsive.  On EMS evaluation the patient was apneic with thready pulse.  He did not responded to intranasal Narcan.  He was bagged in route to the emergency department and subsequently intubated on arrival to the ED. CT head demonstrated cerebral edema without loss of white matter differentiation. He was transferred to [facility] for MRI. On 9/11 he was noted to have upper extremity edema on the right. Ultrasound demonstrated acute thrombosis in the mid and distal basilic vein. Hospital course also complicated by aspiration pneumonia. Subsequently transferred to [facility] on 9/13.   Events:  9/6 found down > present to [facility] ED. CT with bilateral cerebellar edema. 9/7 repeat CT normal Unable to be weaned from sedation 9/11 RUE DVT started on tx dose lovenox 9/13 transfer to [facility] for MRI 9/14 chest tube placed with intrapleural fibrinolytic administration 9/16 chest tube removed 9/17 patient extubated, remained agitated and started on precedex 9/19 Cortrak placed  10/4 Attempting to wean cortrak (increase PO intake more liberally over the next 24h per SLP recs) - once cortrak removed can start having reasonable discussion on disposition.  10/5 Cortrak accidentally removed - advancing diet as tolerated 10/6 - overnight agitation - haldol x1 given - continues to complain of "having a panic attack" but then describes reflux symptoms. 10/7-patient continues to have word finding difficulty, received benzos overnight as patient reportedly requested  outpatient Valium which she has not been on and does not currently take.  We will attempt to limit CNS depressing medications given his ongoing mental status changes from previous baseline secondary to below. Palliative will follow along from a distance which is certainly reasonable given his stable nature. 10/8 - diet continues to improve, initiate hydroxyzine PRN 10/9 -patient continues to progress, remains impulsive, difficult to reorient but able to ambulate somewhat today with physical therapy, recommendations pending but certainly approaching safe disposition to facility.  Unsafe discharge home alone or with family given need for 24-hour care and monitoring given his current mental status. 10/10 Patient now being evaluated for CIR placement - not appropriate for this level of care per CIR - (remains very impulsive and requires 24h monitoring but more redirectable as of late). PO intake markedly improving - no indication for PEG tube at this time (ate 100% of breakfast/lunch with minimal assistance). 10/15 Psych re-consulted for increasing agitation - recommending hydroxyzine, haldol, carbamazepine, seroquel - signed off as of 10/19  Subjective/Interval History: Issues with agitation overnight ongoing - passively threatening self harm without actual intention per documentation -patient continues fixated on a missing tooth he thinks he swallowed overnight while eating.  Dentition is poor, unclear if he actually chipped a tooth while eating but no obvious missing tooth on exam.  Assessment/Plan:  Acute toxic metabolic encephalopathy with concurrent ischemic stroke, stablizing, POA: -In the setting of acute stroke/toxic encephalopathy versus possible previous anoxic event as patient was found down in the field of unclear etiology and duration -with cocaine positive UDS Seen by neurology.  Patient was given high-dose thiamine. Now on once a day thiamine. -  Per neurology high suspicion for anoxic brain  injury versus toxin induced encephalopathy and ischemic strokes given negative EEG. -Avoid benzodiazepine as much as possible - Continue hydroxyzine/haldol/carbamazepine/seroquel per Psych (re-consulted 10/15 for worsening agitation)   Multifocal cerebral infarcts: -Neurology recommended to start aspirin once patient is off of anticoagulation (presumed DVT).  He will come off anticoagulation on December 11. -High risk for falls, discussed fall risk with family, safe disposition is paramount as below   Acute hypoxic respiratory failure secondary to aspiration pneumonia, resolved Right-sided aspiration pneumonia with parapneumonic effusion: -Status post chest tube placement with intrapleural lytics on 9/14, chest tube removed 9/16. -Completed 7 days of Zosyn -Intubated on admission outside hospital 9/6. -Extubated 9/17. Respiratory status is stable.   Superficial vein thrombosis 8/11 Right basilic vein thrombus -Due to increase risk for progression of thrombosis patient was started on Lovenox per previous discussion with oncology (Dr. Alen Blew). Patient has been transitioned to apixaban. Plan for 3 months of treatment which will be until December 11.   Dysphagia/nutrition:  Initially was found to be with significant oropharyngeal dysphagia.  Required tube feedings.  PEG was being considered however then his oral intake started improving.  No indication for PEG tube at this time.   Thrombocytosis: Resolved  Normocytic anemia Slight drop in hemoglobin was noted.  Hemoglobin noted to be stable this morning.  Check every few days.   Mild liver dysfunction/abnormal LFTs History of hepatitis C, improved.   Diarrhea, resolved:   Severe protein calorie malnutrition Nutrition Problem: Severe Malnutrition Etiology: social / environmental circumstances (drug abuse) Signs/Symptoms: severe fat depletion, severe muscle depletion  Goals of care  Previous providers have had lengthy conversation  with the patient's daughter. Family expectations is that patient will continue to recover back to previous baseline.  This was felt to be unlikely.  -Prior disposition was somewhat limited by NG tube placement with prior concern for possible PEG tube placement, now that dietary intake is markedly improving there is no further need to pursue PEG placement. Daughter was updated 10/15.  She did clarify that she was the eldest of the patient's children.  However the patient does have other children who are all minor.     DVT prophylaxis:   Apixaban Code Status: Full Code  Family Communication: No family at side Disposition: Not a candidate for CIR.  SNF being pursued.  Continues to require sitter. Not yet ready for discharge.  Status is: Inpatient Remains inpatient appropriate because: Unsafe discharge, needs placement.   Medications: Scheduled:  amLODipine  5 mg Oral Daily   apixaban  5 mg Oral BID   feeding supplement  237 mL Oral BID BM   lidocaine  1 patch Transdermal Q24H   melatonin  5 mg Oral QHS   pantoprazole  40 mg Oral Q1200   QUEtiapine  100 mg Oral QHS   QUEtiapine  25 mg Oral BID   thiamine  100 mg Oral Daily   Continuous: BJY:[id number], calcium carbonate, docusate sodium, haloperidol lactate, hydrOXYzine, ondansetron, mouth rinse, polyethylene glycol  Antibiotics: Anti-infectives (From admission, onward)    Start     Dose/Rate Route Frequency Ordered Stop   10/23/21 1000  cefTRIAXone (ROCEPHIN) 2 g in sodium chloride 0.9 % 100 mL IVPB  Status:  Discontinued        2 g 200 mL/hr over 30 Minutes Intravenous Every 24 hours 10/22/21 0036 10/22/21 0037   10/23/21 1000  cefTRIAXone (ROCEPHIN) 2 g in sodium chloride 0.9 % 100 mL IVPB  Status:  Discontinued        2 g 200 mL/hr over 30 Minutes Intravenous Every 24 hours 10/22/21 0037 10/22/21 0827   10/22/21 0930  piperacillin-tazobactam (ZOSYN) IVPB 3.375 g        3.375 g 12.5 mL/hr over 240 Minutes Intravenous Every  8 hours 10/22/21 0833 10/29/21 0559   10/22/21 0915  ceFEPIme (MAXIPIME) 2 g in sodium chloride 0.9 % 100 mL IVPB  Status:  Discontinued        2 g 200 mL/hr over 30 Minutes Intravenous Every 8 hours 10/22/21 0827 10/22/21 0833       Objective:  Vital Signs  Vitals:   11/25/21 2017 11/25/21 2309 11/26/21 0332 11/26/21 0500  BP: 107/62 119/87 (!) 122/91   Pulse: 99 89 96   Resp: 18 18 18    Temp: 98.3 F (36.8 C) 97.7 F (36.5 C) (!) 97.5 F (36.4 C)   TempSrc: Oral Oral Axillary   SpO2: 98% 98% 99%   Weight:    73.7 kg  Height:        Intake/Output Summary (Last 24 hours) at 11/26/2021 0720 Last data filed at 11/25/2021 1700 Gross per 24 hour  Intake 1180 ml  Output --  Net 1180 ml    Filed Weights   11/24/21 0449 11/25/21 0500 11/26/21 0500  Weight: 73.6 kg 73.7 kg 73.7 kg    General appearance: Awake alert.  In no distress Resp: Clear to auscultation bilaterally.  Normal effort Cardio: S1-S2 is normal regular.  No S3-S4.  No rubs murmurs or bruit GI: Abdomen is soft.  Nontender nondistended.  Bowel sounds are present normal.  No masses organomegaly   Lab Results:  Data Reviewed: I have personally reviewed following labs and reports of the imaging studies  CBC: Recent Labs  Lab 11/20/21 0500 11/23/21 0827  WBC 10.0 9.2  HGB 12.7* 12.5*  HCT 37.1* 37.8*  MCV 93.5 95.7  PLT 253 311     Basic Metabolic Panel: Recent Labs  Lab 11/20/21 0500 11/23/21 0827  NA 139 136  K 4.1 3.9  CL 106 105  CO2 25 25  GLUCOSE 102* 78  BUN 15 19  CREATININE 0.81 0.76  CALCIUM 9.5 9.2  MG 2.0  --      GFR: Estimated Creatinine Clearance: 105.2 mL/min (by C-G formula based on SCr of 0.76 mg/dL).     Radiology Studies: No results found.     LOS: 36 days   11/25/21  [facility] Pager on [email or web address]  11/26/2021, 7:20 AM

## 2021-11-26 NOTE — Progress Notes (Signed)
Nutrition Follow-up  DOCUMENTATION CODES:  Severe malnutrition in context of social or environmental circumstances  INTERVENTION:  Continue current diet as recommend per SLP Feeding assistance Ensure Enlive po TID, each supplement provides 350 kcal and 20 grams of protein. Mighty shakes TID to provide 330kcal and 9g of protein  NUTRITION DIAGNOSIS:  Severe Malnutrition related to social / environmental circumstances (drug abuse) as evidenced by severe fat depletion, severe muscle depletion. - remains applicable  GOAL:  Patient will meet greater than or equal to 90% of their needs - progressing  MONITOR:  TF tolerance, Diet advancement, Labs  REASON FOR ASSESSMENT:  Consult Assessment of nutrition requirement/status (Patient with Diarrhea, should we change formula?)  ASSESSMENT:   56 yo male admitted to [facility] after being found down. Transferred to [facility] with concerns for anoxic brain injury (CT showed bilateral cerebellar edema). PMH includes polysubstance abuse, hepatitis C, GERD, smoker.  9/13 - admitted from outside facility intubated 9/14 - TF initiated 9/18 - extubated, SLP eval, recommended NPO 9/19 - cortrak placed 9/27 - MBS, advanced to DYS3/Thins, IR evaluated and found anatomy is amenable to g-tube if needed 9/29 - TF were adjusted to nocturnal 10/5 - Pt pulled cortrak  Pt continues to have improved intake and with supplements is likely meeting nutrition needs. No plans for additional nutrition interventions at this time. Nutrition will sign off at this time. If additional nutrition needs arise please place a new consult.    Admit weight: 80.9 kg Current Weight: 73.7 kg  Average Meal Intake: 9/27-9/29: 23% average intake x 4 recorded meals 9/30-10/3: 20% intake x 8 recorded meals 10/4-10/5: 29% intake x 5 recorded meals 10/6-10/12: 48% x 11 recorded meals 10/13-10/19: 94% x 16 recorded meals   Intake/Output Summary (Last 24 hours) at 11/26/2021  1633 Last data filed at 11/25/2021 1700 Gross per 24 hour  Intake 240 ml  Output --  Net 240 ml  Net IO Since Admission: -91.06 mL [11/26/21 1633]  Nutritionally Relevant Medications: Scheduled Meds:  feeding supplement  237 mL Oral BID BM   pantoprazole  40 mg Oral Q1200   thiamine  100 mg Oral Daily   Labs Reviewed  NUTRITION - FOCUSED PHYSICAL EXAM: Flowsheet Row Most Recent Value  Orbital Region Mild depletion  Upper Arm Region Severe depletion  Thoracic and Lumbar Region Severe depletion  Buccal Region Mild depletion  Temple Region Mild depletion  Clavicle Bone Region Severe depletion  Clavicle and Acromion Bone Region Severe depletion  Scapular Bone Region Mild depletion  Dorsal Hand Severe depletion  Patellar Region Severe depletion  Anterior Thigh Region Severe depletion  Posterior Calf Region Severe depletion  Edema (RD Assessment) None  Hair Reviewed  [tangled, unkept]  Eyes Reviewed  Mouth Reviewed  Skin Reviewed  Nails Reviewed   Diet Order:   Diet Order             Diet regular Room service appropriate? No; Fluid consistency: Thin  Diet effective now                   EDUCATION NEEDS:  Not appropriate for education at this time  Skin:  Skin Assessment: Reviewed RN Assessment  Last BM:  10/18 - type 6  Height:  Ht Readings from Last 1 Encounters:  10/21/21 5\' 10"  (1.778 m)    Weight:  Wt Readings from Last 1 Encounters:  11/26/21 73.7 kg    Ideal Body Weight:  75.5 kg  BMI:  Body mass index  is 23.31 kg/m.  Estimated Nutritional Needs:  Kcal:  2200-2400 Protein:  110-120 gm Fluid:  2.2-2.4 L   Ranell Patrick, RD, LDN Clinical Dietitian RD pager # available in [place]  After hours/weekend pager # available in [facility]

## 2021-11-26 NOTE — Progress Notes (Signed)
Very agitated and said he is going to kill himself if a doctor does not get here to see him.  Did admit that he said that in anger and has no plan to kill or harm himself.  Michela Pitcher he has not seen a doctor is several days.  Was reminded that he does see a doctor daily.  Michela Pitcher he wants to go home and cannot live here like this forever.  I just gave him some atarax po because he is very agitated all of a sudden.  He said he swallowed his tooth but won't let me look in his mouth because I am not a doctor.  He wants a doctor to come and look at his tooth because his mouth is in pain.  He is very tearful.  He only has Tylenol for pain but did take the Tylenol.  I paged Dr Bridgett Larsson who did return my call and said to have the rounding doctors to see him.  He is starting to calm down now and is even making jokes at this time.

## 2021-11-26 NOTE — TOC Progression Note (Signed)
Transition of Care [facility]) - Progression Note    Patient Details  Name: [name] MRN: [id number] Date of Birth: 08-01-1965  Transition of Care [facility]) CM/SW [place], [place] Phone Number: 11/26/2021, 2:05 PM  Clinical Narrative:   CSW continuing to follow for SNF placement at discharge. Patient not medically ready, continues with agitation, psychiatry following. Still no bed offers at this time. CSW to follow.    Expected Discharge Plan: Skilled Nursing Facility Barriers to Discharge: Continued Medical Work up, [facility] will not accept until restraint criteria met, Requiring sitter/restraints  Expected Discharge Plan and Services Expected Discharge Plan: [employer] Choice: IP Rehab Living arrangements for the past 2 months: Single Family Home                                       Social Determinants of Health (SDOH) Interventions    Readmission Risk Interventions     No data to display

## 2021-11-26 NOTE — Progress Notes (Signed)
Resting comfortably at this time.  Has been calm and very cooperative throughout the shift except a couple times got confused and thought he needed to go to work and to go home.  Was trying to leave and was asking to borrow my car to get him to work.  This went on for several minutes and was difficult to redirect at that time then finally calmed down and was redirectable.  No signs of distress.

## 2021-11-26 NOTE — Plan of Care (Signed)
  Problem: Activity: Goal: Risk for activity intolerance will decrease Outcome: Progressing   Problem: Nutrition: Goal: Adequate nutrition will be maintained Outcome: Progressing   Problem: Coping: Goal: Level of anxiety will decrease Outcome: Progressing   

## 2021-11-26 NOTE — Progress Notes (Signed)
Occupational Therapy Treatment Patient Details Name: [name] MRN: [id number] DOB: May 31, 1965 Today's Date: 11/26/2021   History of present illness Pt is 56 yo male admitted on to [facility] in [place] on 9/6 after being found unresponsive.  Pt transferred to [facility] on 10/21/21.  Pt with acute toxic metabolic encephalopathy vs encephalitis in pt with acute ischemic strokes per neurology.  9/11 R UE DVT started on lovenox, 9/14-9/16 chest tube, ETT 9/6-9/18.  Hx of polysubstance abuse, hep C, and GERD.   OT comments  Pt motivated to ambulate on OT arrival. Pt continues with difficulty attending to tasks, orientation, problem solving, following commands, and memory. Pt highly distractible with tangential conversation throughout session, emotional lability, and reporting he is anxious frequently. Pt performing functional mobility with supervision. Max cues for following multistep commands, and max cues to attend to cognitive task of counting backward from 20 to 1. With 5 errors during attempt to count backward despite mod cues to assist. Continuing to recommend discharge at SNF to optimize safety and independence in ADL.    Recommendations for follow up therapy are one component of a multi-disciplinary discharge planning process, led by the attending physician.  Recommendations may be updated based on patient status, additional functional criteria and insurance authorization.    Follow Up Recommendations  Skilled nursing-short term rehab (<3 hours/day)    Assistance Recommended at Discharge Frequent or constant Supervision/Assistance  Patient can return home with the following  A little help with walking and/or transfers;A little help with bathing/dressing/bathroom;Assistance with cooking/housework;Direct supervision/assist for medications management;Direct supervision/assist for financial management;Help with stairs or ramp for entrance;Assist for transportation   Equipment Recommendations   Other (comment) (defer to next venue)    Recommendations for Other Services      Precautions / Restrictions Precautions Precautions: Fall Restrictions Weight Bearing Restrictions: No       Mobility Bed Mobility               General bed mobility comments: Pt sitting EOB on arrival    Transfers Overall transfer level: Needs assistance Equipment used: None Transfers: Sit to/from Stand Sit to Stand: Supervision           General transfer comment: supervision for safety due to impulsivity     Balance Overall balance assessment: Needs assistance Sitting-balance support: No upper extremity supported, Feet supported Sitting balance-Leahy Scale: Fair Sitting balance - Comments: No LOB donning socks and no physical assist required   Standing balance support: No upper extremity supported, During functional activity Standing balance-Leahy Scale: Fair                             ADL either performed or assessed with clinical judgement   ADL Overall ADL's : Needs assistance/impaired     Grooming: Brushing hair;Wash/dry hands;Supervision/safety Grooming Details (indicate cue type and reason): supervision for safety             Lower Body Dressing: Supervision/safety;Sitting/lateral leans Lower Body Dressing Details (indicate cue type and reason): donning/doffing socks Toilet Transfer: Copy Details (indicate cue type and reason): simulated in room         Functional mobility during ADLs: Supervision/safety General ADL Comments: Supervision for safety during functional mobility    Extremity/Trunk Assessment              Vision   Additional Comments: reading glasses in room   Perception     Praxis  Cognition Arousal/Alertness: Awake/alert Behavior During Therapy: Anxious, Restless, Impulsive Overall Cognitive Status: Impaired/Different from baseline Area of Impairment: Attention, Following commands,  Safety/judgement, Awareness, Problem solving, Memory, Orientation                 Orientation Level: Disoriented to, Situation, Place Current Attention Level: Focused Memory: Decreased short-term memory (Pt telling pt she wants him to remember birthday and unable on delayed memory recall) Following Commands: Follows one step commands inconsistently (Inability to focus/high distractability) Safety/Judgement: Decreased awareness of safety, Decreased awareness of deficits Awareness: Emergent (takes self to restroom. Decreased awareness of deficits and when becomes aware laughs as distractor) Problem Solving: Requires verbal cues, Requires tactile cues General Comments: pt paranoid, confabulating, hallucinating, confused, not oriented, hyperfocused on "deputy woody thinks I am not safe to go live with my daughter and I am a bother", although reporting he is happy and has seen everyone important to him today upon OT entry into room. Pt hyperfocused with high distractibility. Pt reports "I"m so anxious." Frequent emotional lability. Some difficulty word finding. Max A and redirection to count backward from 20. Max cues to follow multistep commands        Exercises      Shoulder Instructions       General Comments VSS    Pertinent Vitals/ Pain       Pain Assessment Pain Assessment: Faces Faces Pain Scale: No hurt Pain Intervention(s): Monitored during session  Home Living                                          Prior Functioning/Environment              Frequency  Min 1X/week        Progress Toward Goals  OT Goals(current goals can now be found in the care plan section)  Progress towards OT goals: Progressing toward goals  Acute Rehab OT Goals Patient Stated Goal: None stated OT Goal Formulation: With patient Time For Goal Achievement: 12/10/21 Potential to Achieve Goals: Fair ADL Goals Pt Will Perform Grooming: with modified  independence;standing Additional ADL Goal #1: Pt will follow two step command with mod cues Additional ADL Goal #2: Pt will perform simple meal prep task with min A  Plan Discharge plan needs to be updated    Co-evaluation                 AM-PAC OT "6 Clicks" Daily Activity     Outcome Measure   Help from another person eating meals?: A Little Help from another person taking care of personal grooming?: A Little Help from another person toileting, which includes using toliet, bedpan, or urinal?: A Little Help from another person bathing (including washing, rinsing, drying)?: A Little Help from another person to put on and taking off regular upper body clothing?: A Little Help from another person to put on and taking off regular lower body clothing?: A Lot 6 Click Score: 17    End of Session    OT Visit Diagnosis: Unsteadiness on feet (R26.81);Other abnormalities of gait and mobility (R26.89);Other symptoms and signs involving cognitive function;Muscle weakness (generalized) (M62.81);Low vision, both eyes (H54.2)   Activity Tolerance Patient tolerated treatment well   Patient Left in bed;with call bell/phone within reach;with nursing/sitter in room   Nurse Communication Mobility status        Time: 1700-1720 OT Time Calculation (  min): 20 min  Charges: OT General Charges $OT Visit: 1 Visit OT Treatments $Therapeutic Activity: 8-22 mins  Ladene Artist, OTR/L [facility] Acute Rehabilitation Office: [phone]   Drue Novel 11/26/2021, 5:48 PM

## 2021-11-27 DIAGNOSIS — G9340 Encephalopathy, unspecified: Secondary | ICD-10-CM | POA: Diagnosis not present

## 2021-11-27 NOTE — Progress Notes (Signed)
Has been calm and cooperative for the most part throughout the day then all of a sudden has an outburst and will yell at staff and gets upset because he cannot go outside with his girlfriend.  Became belligerent for a moment then calmed down and apologized.  No signs of distress.

## 2021-11-27 NOTE — Progress Notes (Signed)
[facility] PROGRESS NOTE   [name] HUD:[id number] DOB: 1965/04/18 DOA: 10/21/2021  PCP: [name], MD  Brief History/Interval Summary: 56 year old WM PMHx Polysubstance abuse, Hepatitis C,    Presented to [facility] in [place] on 9/6 after being found down.  He was last known well at approximately 13:00 hours that day however EMS was called for wellness check at some time later on.  He was found to be down and unresponsive.  On EMS evaluation the patient was apneic with thready pulse.  He did not responded to intranasal Narcan.  He was bagged in route to the emergency department and subsequently intubated on arrival to the ED. CT head demonstrated cerebral edema without loss of white matter differentiation. He was transferred to [facility] for MRI. On 9/11 he was noted to have upper extremity edema on the right. Ultrasound demonstrated acute thrombosis in the mid and distal basilic vein. Hospital course also complicated by aspiration pneumonia. Subsequently transferred to [facility] on 9/13.   Events:  9/6 found down > present to [facility] ED. CT with bilateral cerebellar edema. 9/7 repeat CT normal Unable to be weaned from sedation 9/11 RUE DVT started on tx dose lovenox 9/13 transfer to [name] for MRI 9/14 chest tube placed with intrapleural fibrinolytic administration 9/16 chest tube removed 9/17 patient extubated, remained agitated and started on precedex 9/19 Cortrak placed  10/4 Attempting to wean cortrak (increase PO intake more liberally over the next 24h per SLP recs) - once cortrak removed can start having reasonable discussion on disposition.  10/5 Cortrak accidentally removed - advancing diet as tolerated 10/6 - overnight agitation - haldol x1 given - continues to complain of "having a panic attack" but then describes reflux symptoms. 10/7-patient continues to have word finding difficulty, received benzos overnight as patient reportedly requested  outpatient Valium which she has not been on and does not currently take.  We will attempt to limit CNS depressing medications given his ongoing mental status changes from previous baseline secondary to below. Palliative will follow along from a distance which is certainly reasonable given his stable nature. 10/8 - diet continues to improve, initiate hydroxyzine PRN 10/9 -patient continues to progress, remains impulsive, difficult to reorient but able to ambulate somewhat today with physical therapy, recommendations pending but certainly approaching safe disposition to facility.  Unsafe discharge home alone or with family given need for 24-hour care and monitoring given his current mental status. 10/10 Patient now being evaluated for CIR placement - not appropriate for this level of care per CIR - (remains very impulsive and requires 24h monitoring but more redirectable as of late). PO intake markedly improving - no indication for PEG tube at this time (ate 100% of breakfast/lunch with minimal assistance). 10/15 Psych re-consulted for increasing agitation - recommending hydroxyzine, haldol, carbamazepine, seroquel - signed off as of 10/19  Subjective/Interval History: Agitation improving somewhat over the last 48h, patient more appropriate - continues to call his family to discuss dispo situation.  Assessment/Plan:  Acute toxic metabolic encephalopathy with concurrent ischemic stroke, stablizing, POA: -In the setting of acute stroke/toxic encephalopathy versus possible previous anoxic event as patient was found down in the field of unclear etiology and duration -with cocaine positive UDS Seen by neurology.  Patient was given high-dose thiamine. Now on once a day thiamine. -Per neurology high suspicion for anoxic brain injury versus toxin induced encephalopathy and ischemic strokes given negative EEG. -Avoid benzodiazepine as much as possible - Continue hydroxyzine/haldol/carbamazepine/seroquel per  Psych (re-consulted 10/15 for worsening agitation)   Multifocal cerebral infarcts: -Neurology recommended to start aspirin once patient is off of anticoagulation (presumed DVT).  He will come off anticoagulation on December 11. -High risk for falls, discussed fall risk with family, safe disposition is paramount as below   Acute hypoxic respiratory failure secondary to aspiration pneumonia, resolved Right-sided aspiration pneumonia with parapneumonic effusion: -Status post chest tube placement with intrapleural lytics on 9/14, chest tube removed 9/16. -Completed 7 days of Zosyn -Intubated on admission outside hospital 9/6. -Extubated 9/17. Respiratory status is stable.   Superficial vein thrombosis 9/56 Right basilic vein thrombus -Due to increase risk for progression of thrombosis patient was started on Lovenox per previous discussion with oncology (Dr. Alen Blew). Patient has been transitioned to apixaban. Plan for 3 months of treatment which will be until December 11.   Dysphagia/nutrition:  Initially was found to be with significant oropharyngeal dysphagia.  Required tube feedings.  PEG was being considered however then his oral intake started improving.  No indication for PEG tube at this time.   Thrombocytosis: Resolved  Normocytic anemia Slight drop in hemoglobin was noted.  Hemoglobin noted to be stable this morning.  Check every few days.   Mild liver dysfunction/abnormal LFTs History of hepatitis C, improved.   Diarrhea, resolved:   Severe protein calorie malnutrition Nutrition Problem: Severe Malnutrition Etiology: social / environmental circumstances (drug abuse) Signs/Symptoms: severe fat depletion, severe muscle depletion  Goals of care  Previous providers have had lengthy conversation with the patient's daughter. Family expectations is that patient will continue to recover back to previous baseline.  This was felt to be unlikely.  -Prior disposition was somewhat  limited by NG tube placement with prior concern for possible PEG tube placement, now that dietary intake is markedly improving there is no further need to pursue PEG placement. Daughter was updated 10/15.  She did clarify that she was the eldest of the patient's children.  However the patient does have other children who are all minor.     DVT prophylaxis:   Apixaban Code Status: Full Code  Family Communication: No family at side Disposition: Not a candidate for CIR.  SNF being pursued.  Continues to require sitter. Not yet ready for discharge.  Status is: Inpatient Remains inpatient appropriate because: Unsafe discharge, needs placement.   Medications: Scheduled:  amLODipine  5 mg Oral Daily   apixaban  5 mg Oral BID   feeding supplement  237 mL Oral BID BM   lidocaine  1 patch Transdermal Q24H   melatonin  5 mg Oral QHS   pantoprazole  40 mg Oral Q1200   QUEtiapine  100 mg Oral QHS   QUEtiapine  25 mg Oral BID   thiamine  100 mg Oral Daily   Continuous: LOV:FIEPPIRJJOACZ, calcium carbonate, docusate sodium, haloperidol lactate, hydrOXYzine, ondansetron, mouth rinse, polyethylene glycol  Antibiotics: Anti-infectives (From admission, onward)    Start     Dose/Rate Route Frequency Ordered Stop   10/23/21 1000  cefTRIAXone (ROCEPHIN) 2 g in sodium chloride 0.9 % 100 mL IVPB  Status:  Discontinued        2 g 200 mL/hr over 30 Minutes Intravenous Every 24 hours 10/22/21 0036 10/22/21 0037   10/23/21 1000  cefTRIAXone (ROCEPHIN) 2 g in sodium chloride 0.9 % 100 mL IVPB  Status:  Discontinued        2 g 200 mL/hr over 30 Minutes Intravenous Every 24 hours 10/22/21 0037 10/22/21 0827   10/22/21 0930  piperacillin-tazobactam (ZOSYN) IVPB 3.375 g        3.375 g 12.5 mL/hr over 240 Minutes Intravenous Every 8 hours 10/22/21 0833 10/29/21 0559   10/22/21 0915  ceFEPIme (MAXIPIME) 2 g in sodium chloride 0.9 % 100 mL IVPB  Status:  Discontinued        2 g 200 mL/hr over 30 Minutes  Intravenous Every 8 hours 10/22/21 0827 10/22/21 0833       Objective:  Vital Signs  Vitals:   11/26/21 1923 11/26/21 2303 11/27/21 0303 11/27/21 0727  BP: 122/87 120/84 117/87 (!) 130/97  Pulse: 99 93 90 95  Resp: 17 17 17 18   Temp: 98.1 F (36.7 C) 97.8 F (36.6 C) 97.9 F (36.6 C) 98 F (36.7 C)  TempSrc: Oral Oral Oral Oral  SpO2: 98% 96% 96% 98%  Weight:      Height:       No intake or output data in the 24 hours ending 11/27/21 0812  Filed Weights   11/24/21 0449 11/25/21 0500 11/26/21 0500  Weight: 73.6 kg 73.7 kg 73.7 kg    General appearance: Awake alert.  In no distress Resp: Clear to auscultation bilaterally.  Normal effort Cardio: S1-S2 is normal regular.  No S3-S4.  No rubs murmurs or bruit GI: Abdomen is soft.  Nontender nondistended.  Bowel sounds are present normal.  No masses organomegaly   Lab Results:  Data Reviewed: I have personally reviewed following labs and reports of the imaging studies  CBC: Recent Labs  Lab 11/23/21 0827  WBC 9.2  HGB 12.5*  HCT 37.8*  MCV 95.7  PLT 311     Basic Metabolic Panel: Recent Labs  Lab 11/23/21 0827  NA 136  K 3.9  CL 105  CO2 25  GLUCOSE 78  BUN 19  CREATININE 0.76  CALCIUM 9.2     GFR: Estimated Creatinine Clearance: 105.2 mL/min (by C-G formula based on SCr of 0.76 mg/dL).     Radiology Studies: No results found.     LOS: 37 days   11/25/21  [facility] Pager on [email or web address]  11/27/2021, 8:12 AM

## 2021-11-27 NOTE — Progress Notes (Signed)
Mobility Specialist: Progress Note   11/27/21 1059  Mobility  Activity Ambulated with assistance in hallway  Level of Assistance Standby assist, set-up cues, supervision of patient - no hands on  Assistive Device None  Distance Ambulated (ft) 300 ft  Activity Response Tolerated well  Mobility Referral Yes  $Mobility charge 1 Mobility   Pt received standing in hall and agreeable to outside ambulation. Ambulated short distance outside d/t pt being cold. Returned to the unit and continued ambulation. Pt back to bed after session with NT present in the room.   [facility] Fager Mobility Specialist Secure Chat Only

## 2021-11-28 DIAGNOSIS — G9340 Encephalopathy, unspecified: Secondary | ICD-10-CM | POA: Diagnosis not present

## 2021-11-28 MED ORDER — ACETAMINOPHEN 325 MG PO TABS: 650.0000 mg | ORAL_TABLET | Freq: Four times a day (QID) | ORAL | 5 refills | Status: DC | PRN

## 2021-11-28 MED ORDER — APIXABAN 5 MG PO TABS: 5.0000 mg | ORAL_TABLET | Freq: Two times a day (BID) | ORAL | 0 refills | Status: DC

## 2021-11-28 MED ORDER — ENSURE ENLIVE PO LIQD: 237.0000 mL | Freq: Two times a day (BID) | ORAL | 12 refills | Status: DC

## 2021-11-28 MED ORDER — QUETIAPINE FUMARATE 25 MG PO TABS: 25.0000 mg | ORAL_TABLET | Freq: Two times a day (BID) | ORAL | 5 refills | Status: DC

## 2021-11-28 MED ORDER — AMLODIPINE BESYLATE 5 MG PO TABS: 5.0000 mg | ORAL_TABLET | Freq: Every day | ORAL | 5 refills | Status: DC

## 2021-11-28 MED ORDER — MELATONIN 5 MG PO TABS: 5.0000 mg | ORAL_TABLET | Freq: Every day | ORAL | 5 refills | Status: DC

## 2021-11-28 MED ORDER — VITAMIN B-1 100 MG PO TABS: 100.0000 mg | ORAL_TABLET | Freq: Every day | ORAL | 5 refills | Status: DC

## 2021-11-28 MED ORDER — ONDANSETRON 4 MG PO TBDP: 4.0000 mg | ORAL_TABLET | Freq: Three times a day (TID) | ORAL | 0 refills | Status: DC | PRN

## 2021-11-28 MED ORDER — PANTOPRAZOLE SODIUM 40 MG PO TBEC: 40.0000 mg | DELAYED_RELEASE_TABLET | Freq: Every day | ORAL | 5 refills | Status: DC

## 2021-11-28 MED ORDER — LIDOCAINE 5 % EX PTCH: 1.0000 | MEDICATED_PATCH | CUTANEOUS | 0 refills | Status: DC

## 2021-11-28 MED ORDER — CALCIUM CARBONATE ANTACID 500 MG PO CHEW: 1.0000 | CHEWABLE_TABLET | Freq: Two times a day (BID) | ORAL | 0 refills | Status: DC | PRN

## 2021-11-28 MED ORDER — QUETIAPINE FUMARATE 100 MG PO TABS: 100.0000 mg | ORAL_TABLET | Freq: Every day | ORAL | 5 refills | Status: DC

## 2021-11-28 MED ORDER — HYDROXYZINE HCL 25 MG PO TABS: 25.0000 mg | ORAL_TABLET | Freq: Four times a day (QID) | ORAL | 0 refills | Status: DC | PRN

## 2021-11-28 NOTE — TOC Progression Note (Signed)
Transition of Care [facility]) - Progression Note    Patient Details  Name: [name] MRN: [id number] Date of Birth: [birth year]  Transition of [facility]) CM/SW Contact  Carles Collet, RN Phone Number: 11/28/2021, 5:27 PM  Clinical Narrative:     LVM with Olga Coaster RN, TOC Leadership on call for this weekend to review chart for recommendations for discharge.   Expected Discharge Plan: Skilled Nursing Facility Barriers to Discharge: Continued Medical Work up, [place] will not accept until restraint criteria met, Requiring sitter/restraints  Expected Discharge Plan and Services Expected Discharge Plan: [place] Choice: IP Rehab Living arrangements for the past 2 months: Single Family Home                                       Social Determinants of Health (SDOH) Interventions    Readmission Risk Interventions     No data to display

## 2021-11-28 NOTE — Discharge Summary (Signed)
Physician Discharge Summary  [name] VQM:[id number] DOB: [birth year] DOA: 10/21/2021  PCP: [name], MD  Admit date: 10/21/2021 Discharge date: 11/28/2021  Admitted From: Home Disposition: Home with [name], significant other  Recommendations for Outpatient Follow-up:  Follow up with PCP in 1-2 weeks  Home Health: None Equipment/Devices: None indicated  Discharge Condition: Stable CODE STATUS: Full Diet recommendation: Low-salt low-fat low-carb diet  Brief/Interim Summary: 56 year old WM PMHx Polysubstance abuse, Hepatitis C, presented to [facility] in [place] on 9/6 after being found down.  He was last known well at approximately 13:00 hours that day however EMS was called for wellness check at some time later on.  He was found to be down and unresponsive.  On EMS evaluation the patient was apneic with thready pulse.  He did not responded to intranasal Narcan.  He was bagged in route to the emergency department and subsequently intubated on arrival to the ED. CT head demonstrated cerebral edema without loss of white matter differentiation. He was transferred to [facility] for MRI. On 9/11 he was noted to have upper extremity edema on the right. Ultrasound demonstrated acute thrombosis in the mid and distal basilic vein. Hospital course also complicated by aspiration pneumonia. Subsequently transferred to [facility] on 9/13.  Please see below for list of incidents and events in the hospital, briefly patient was admitted on 10/14/2021 after being found down at home noted to have cerebellar edema with longstanding confusion encephalopathy and somewhat labile mood.  Patient's mental status has improved drastically over the past 6 weeks, most notably over the past 2 weeks patient's mental status has improved and is no longer requiring restraints, feeding himself, no longer requiring NG tube and is otherwise ambulating in the hallway independently although needs frequent  redirection given his confusion and sometimes word finding difficulty.  Lengthy discussion today on 11/28/2021 with patient's daughter [name] - he remains medically stable for discharge, now that he is more independent plan is to move forward with discharge to patient's girlfriend [name] who can provide around-the-clock care, provide meals and other incidentals the patient is not capable of managing on his own due to his recent illness/comorbid's as below.  Patient otherwise medically stable for discharge, patient's daughter [name] has confirmed she is okay with discharge to patient's significant other [name].   Events:  9/6 found down > present to [facility] ED. CT with bilateral cerebellar edema. 9/7 repeat CT normal Unable to be weaned from sedation 9/11 RUE DVT started on tx dose lovenox 9/13 transfer to [facility] for MRI 9/14 chest tube placed with intrapleural fibrinolytic administration 9/16 chest tube removed 9/17 patient extubated, remained agitated and started on precedex 9/19 Cortrak placed  10/4 Attempting to wean cortrak (increase PO intake more liberally over the next 24h per SLP recs) - once cortrak removed can start having reasonable discussion on disposition.  10/5 Cortrak accidentally removed - advancing diet as tolerated 10/6 - overnight agitation - haldol x1 given - continues to complain of "having a panic attack" but then describes reflux symptoms. 10/7-patient continues to have word finding difficulty, received benzos overnight as patient reportedly requested outpatient Valium which she has not been on and does not currently take.  We will attempt to limit CNS depressing medications given his ongoing mental status changes from previous baseline secondary to below. Palliative will follow along from a distance which is certainly reasonable given his stable nature. 10/8 - diet continues to improve, initiate hydroxyzine PRN 10/9 -patient continues to progress, remains  impulsive, difficult to  reorient but able to ambulate somewhat today with physical therapy, recommendations pending but certainly approaching safe disposition to facility.  Unsafe discharge home alone or with family given need for 24-hour care and monitoring given his current mental status. 10/10 Patient now being evaluated for CIR placement - not appropriate for this level of care per CIR - (remains very impulsive and requires 24h monitoring but more redirectable as of late). PO intake markedly improving - no indication for PEG tube at this time (ate 100% of breakfast/lunch with minimal assistance). 10/15 Psych re-consulted for increasing agitation - recommending hydroxyzine, haldol, carbamazepine, seroquel - signed off as of 10/19 10/20 -ongoing.  Patient continues to improve daily, more independent ambulating independently feeding himself, minimal episodes of labile mood and outbursts but certainly not yet back to baseline and unsafe discharge on his own.   Subjective/Interval History: Agitation improving somewhat over the last 48h, patient more appropriate - continues to call his family to discuss dispo situation.   Assessment/Plan:   Acute toxic metabolic encephalopathy with concurrent ischemic stroke, stablizing, POA: -In the setting of acute stroke/toxic encephalopathy versus possible previous anoxic event as patient was found down in the field of unclear etiology and duration -with cocaine positive UDS Seen by neurology.  Patient was given high-dose thiamine. Now on once a day thiamine. -Per neurology high suspicion for anoxic brain injury versus toxin induced encephalopathy and ischemic strokes given negative EEG. - Continue hydroxyzine/haldol/carbamazepine/seroquel per Psych (re-consulted 10/15 for worsening agitation)   Multifocal cerebral infarcts: -Neurology recommended to start aspirin once patient is off of anticoagulation (presumed DVT).  He will come off anticoagulation on December 11. -High risk for  falls, discussed fall risk with family, safe disposition is paramount as below   Acute hypoxic respiratory failure secondary to aspiration pneumonia, resolved Right-sided aspiration pneumonia with parapneumonic effusion: -Status post chest tube placement with intrapleural lytics on 9/14, chest tube removed 9/16. -Completed 7 days of Zosyn -Intubated on admission outside hospital 9/6. -Extubated 9/17. Respiratory status is stable.   Superficial vein thrombosis [id number] Right basilic vein thrombus -Due to increase risk for progression of thrombosis patient was started on Lovenox per previous discussion with oncology (Dr. Alen Blew). Patient has been transitioned to apixaban. Plan for 3 months of treatment which will be until December 11.   Dysphagia/nutrition:  Initially was found to be with significant oropharyngeal dysphagia.  Required tube feedings.  PEG was being considered however then his oral intake started improving.  No indication for PEG tube at this time.   Thrombocytosis: Resolved   Normocytic anemia Slight drop in hemoglobin was noted.  Hemoglobin noted to be stable this morning.  Check every few days.   Mild liver dysfunction/abnormal LFTs History of hepatitis C, improved.   Diarrhea, resolved:    Severe protein calorie malnutrition Nutrition Problem: Severe Malnutrition Etiology: social / environmental circumstances (drug abuse) Signs/Symptoms: severe fat depletion, severe muscle depletion   Goals of care  Previous providers have had lengthy conversation with the patient's daughter. Family expectations is that patient will continue to recover back to previous baseline.  This was felt to be unlikely.  -Prior disposition was somewhat limited by NG tube placement with prior concern for possible PEG tube placement, now that dietary intake is markedly improving there is no further need to pursue PEG placement.     Discharge Diagnoses:  Principal Problem:   Acute  encephalopathy Active Problems:   Fever   Pleural effusion   Deep venous thrombosis (  [place])   Acute respiratory failure with hypoxia ([place])   Cerebrovascular accident (CVA) ([place])   Protein-calorie malnutrition, severe   Anoxic brain injury ([place])   Intermittent explosive disorder    Discharge Instructions   Allergies as of 11/28/2021   No Known Allergies      Medication List     STOP taking these medications    Sofosbuvir-Velpatasvir 400-100 MG Tabs Commonly known as: Epclusa       TAKE these medications    acetaminophen 325 MG tablet Commonly known as: TYLENOL Take 2 tablets (650 mg total) by mouth every 6 (six) hours as needed for fever or mild pain.   amLODipine 5 MG tablet Commonly known as: NORVASC Take 1 tablet (5 mg total) by mouth daily. Start taking on: November 29, 2021   apixaban 5 MG Tabs tablet Commonly known as: ELIQUIS Take 1 tablet (5 mg total) by mouth 2 (two) times daily.   calcium carbonate 500 MG chewable tablet Commonly known as: TUMS - dosed in mg elemental calcium Chew 1 tablet (200 mg of elemental calcium total) by mouth 2 (two) times daily as needed for indigestion or heartburn.   feeding supplement Liqd Take 237 mLs by mouth 2 (two) times daily between meals.   hydrOXYzine 25 MG tablet Commonly known as: ATARAX Take 1 tablet (25 mg total) by mouth every 6 (six) hours as needed for anxiety.   lidocaine 5 % Commonly known as: LIDODERM Place 1 patch onto the skin daily. Remove & Discard patch within 12 hours or as directed by MD Start taking on: November 29, 2021   melatonin 5 MG Tabs Take 1 tablet (5 mg total) by mouth at bedtime.   ondansetron 4 MG disintegrating tablet Commonly known as: ZOFRAN-ODT Take 1 tablet (4 mg total) by mouth every 8 (eight) hours as needed for nausea or vomiting.   pantoprazole 40 MG tablet Commonly known as: PROTONIX Take 1 tablet (40 mg total) by mouth daily at 12 noon. Start taking on: November 29, 2021   QUEtiapine 100 MG tablet Commonly known as: SEROQUEL Take 1 tablet (100 mg total) by mouth at bedtime.   QUEtiapine 25 MG tablet Commonly known as: SEROQUEL Take 1 tablet (25 mg total) by mouth 2 (two) times daily at 10 AM and 5 PM.   thiamine 100 MG tablet Commonly known as: Vitamin B-1 Take 1 tablet (100 mg total) by mouth daily. Start taking on: November 29, 2021        No Known Allergies  Consultations: PCCM, neurology, psych, palliative care  Procedures/Studies: DG Swallowing Func-Speech Pathology  Result Date: 11/04/2021 Table formatting from the original result was not included. Images from the original result were not included. Objective Swallowing Evaluation: Type of Study: MBS-Modified Barium Swallow Study  Patient Details Name: Other Schnitzer MRN: [id number] Date of Birth: [birth year] Today's Date: 11/04/2021 Time: SLP Start Time (ACUTE ONLY): 0830 -SLP Stop Time (ACUTE ONLY): 0900 SLP Time Calculation (min) (ACUTE ONLY): 30 min Past Medical History: Past Medical History: Diagnosis Date  Acid reflux   Head injury   Hepatitis C   Hiatal hernia  Past Surgical History: Past Surgical History: Procedure Laterality Date  ACROMIO-CLAVICULAR JOINT REPAIR Right 06/28/2018  Procedure: ACROMIO-CLAVICULAR JOINT IRRIGATION AND DEBRIDEMENT;  Surgeon: [name], MD;  Location: [facility];  Service: Orthopedics;  Laterality: Right;  FACIAL FRACTURE SURGERY    IRRIGATION AND DEBRIDEMENT SHOULDER Right 06/28/2018  Procedure: IRRIGATION AND DEBRIDEMENT SHOULDER;  Surgeon: [name], MD;  Location: [facility] OR;  Service: Orthopedics;  Laterality: Right;  KNEE ARTHROSCOPY Left 06/23/2018  Procedure: LEFT KNEE ARTHROSCOPY KNEE I&D.;  Surgeon: Cammy Copa, MD;  Location: [facility] OR;  Service: Orthopedics;  Laterality: Left; HPI: 56 year old male with past medical history significant for polysubstance abuse, hepatitis C, and GERD who presented to [facility] in [place] on 9/6  after being found down. He was bagged in route to the emergency department at which he arrived around 2300 hrs.  He was intubated immediately upon arrival to the ED 9/6.  CT of the head demonstrated cerebral edema without loss of white matter differentiation.  MRI shows Multifocal, primarily small acute infarcts involving bilateral cerebral hemispheres.. Extubated 9/18.  No data recorded  Recommendations for follow up therapy are one component of a multi-disciplinary discharge planning process, led by the attending physician.  Recommendations may be updated based on patient status, additional functional criteria and insurance authorization. Assessment / Plan / Recommendation   11/04/2021   9:00 AM Clinical Impressions Clinical Impression Pt demonstrates impaired cognition and awareness, but function is better today than all prior sessions. Pt accepted all trials with total assisted feeding, verbalized intermittently in response to staff present. Pt able to take sips of thin liquid with a straw and masticate solids. Oral phase at times prolonged. Pts position facilitated increased safety; his head was in a forward/chin tucked position, allowing any slight delay in swallow initiation to be compensated for well. There were also instances of mild oral residue and spill to pharynx that pt consistently cleared. No penetration or aspiration occurred. Recommend pt initiate a dys 3 soft) diet with thin liquids and total assisted feeding with aspiration precautions, particularly upright posture. Hopeful that access to PO will continue to encourage attention and awareness to environment. Need for ongoing enteral feeding to be determined by pts oral intake. LIkely will need meds crushed in puree. SLP Visit Diagnosis Dysphagia, oropharyngeal phase (R13.12) Impact on safety and function Mild aspiration risk;Risk for inadequate nutrition/hydration     11/04/2021   9:00 AM Treatment Recommendations Treatment Recommendations  Therapy as outlined in treatment plan below      No data to display      11/04/2021   9:00 AM Diet Recommendations SLP Diet Recommendations Dysphagia 3 (Mech soft) solids;Thin liquid Liquid Administration via Cup;Straw Medication Administration Crushed with puree Compensations Slow rate;Small sips/bites;Minimize environmental distractions Postural Changes Seated upright at 90 degrees     11/04/2021   9:00 AM Other Recommendations Oral Care Recommendations Oral care BID Other Recommendations Have oral suction available Assistance recommended at discharge Frequent or constant Supervision/Assistance Functional Status Assessment Patient has had a recent decline in their functional status and demonstrates the ability to make significant improvements in function in a reasonable and predictable amount of time.   11/04/2021   9:00 AM Frequency and Duration  Speech Therapy Frequency (ACUTE ONLY) min 2x/week Treatment Duration 2 weeks     11/04/2021   9:00 AM Oral Phase Oral Phase Impaired Oral - Thin Cup Delayed oral transit Oral - Thin Straw Delayed oral transit Oral - Puree Delayed oral transit Oral - Regular Delayed oral transit;Decreased bolus cohesion    11/04/2021   9:00 AM Pharyngeal Phase Pharyngeal Phase Impaired Pharyngeal- Thin Cup Delayed swallow initiation-vallecula Pharyngeal- Thin Straw Delayed swallow initiation-vallecula Pharyngeal- Puree Delayed swallow initiation-vallecula Pharyngeal- Regular Delayed swallow initiation-vallecula     No data to display    DeBlois, Riley Nearing 11/04/2021, 9:25 AM  DG CHEST PORT 1 VIEW  Result Date: 10/30/2021 CLINICAL DATA:  Pleural effusion, extubated EXAM: PORTABLE CHEST 1 VIEW COMPARISON:  10/25/2021 FINDINGS: Interval extubation. Feeding tube remains. Suspect trace residual right pleural effusion layering posteriorly and minor right base atelectasis. Clear left lung. No pneumothorax. Negative for acute pneumonia or significant airspace disease.  IMPRESSION: Suspect small residual posterior layering right effusion with minor right base atelectasis. Electronically Signed   By: Jerilynn Mages.  Shick M.D.   On: 10/30/2021 06:33     Subjective: No acute issues or events overnight   Discharge Exam: Vitals:   11/28/21 0800 11/28/21 1128  BP: (!) 119/90 117/85  Pulse: 99 92  Resp: 17 17  Temp: 97.7 F (36.5 C) 98 F (36.7 C)  SpO2: 98% 98%   Vitals:   11/28/21 0333 11/28/21 0502 11/28/21 0800 11/28/21 1128  BP: (!) 123/96  (!) 119/90 117/85  Pulse: 90  99 92  Resp: 18  17 17   Temp: 97.7 F (36.5 C)  97.7 F (36.5 C) 98 F (36.7 C)  TempSrc: Oral  Oral Oral  SpO2: 98% 100% 98% 98%  Weight:      Height:        General: Pt is alert, awake, not in acute distress Cardiovascular: RRR, S1/S2 +, no rubs, no gallops Respiratory: CTA bilaterally, no wheezing, no rhonchi Abdominal: Soft, NT, ND, bowel sounds + Extremities: no edema, no cyanosis    The results of significant diagnostics from this hospitalization (including imaging, microbiology, ancillary and laboratory) are listed below for reference.     Microbiology: No results found for this or any previous visit (from the past 240 hour(s)).   Labs: BNP (last 3 results) No results for input(s): "BNP" in the last 8760 hours. Basic Metabolic Panel: Recent Labs  Lab 11/23/21 0827  NA 136  K 3.9  CL 105  CO2 25  GLUCOSE 78  BUN 19  CREATININE 0.76  CALCIUM 9.2   Liver Function Tests: Recent Labs  Lab 11/23/21 0827  AST 23  ALT 44  ALKPHOS 92  BILITOT 0.1*  PROT 6.6  ALBUMIN 3.5   No results for input(s): "LIPASE", "AMYLASE" in the last 168 hours. No results for input(s): "AMMONIA" in the last 168 hours. CBC: Recent Labs  Lab 11/23/21 0827  WBC 9.2  HGB 12.5*  HCT 37.8*  MCV 95.7  PLT 311   Cardiac Enzymes: No results for input(s): "CKTOTAL", "CKMB", "CKMBINDEX", "TROPONINI" in the last 168 hours. BNP: Invalid input(s): "POCBNP" CBG: No results for  input(s): "GLUCAP" in the last 168 hours. D-Dimer No results for input(s): "DDIMER" in the last 72 hours. Hgb A1c No results for input(s): "HGBA1C" in the last 72 hours. Lipid Profile No results for input(s): "CHOL", "HDL", "LDLCALC", "TRIG", "CHOLHDL", "LDLDIRECT" in the last 72 hours. Thyroid function studies No results for input(s): "TSH", "T4TOTAL", "T3FREE", "THYROIDAB" in the last 72 hours.  Invalid input(s): "FREET3" Anemia work up No results for input(s): "VITAMINB12", "FOLATE", "FERRITIN", "TIBC", "IRON", "RETICCTPCT" in the last 72 hours. Urinalysis    Component Value Date/Time   COLORURINE AMBER (A) 10/22/2021 0132   APPEARANCEUR CLEAR 10/22/2021 0132   LABSPEC 1.027 10/22/2021 0132   PHURINE 7.0 10/22/2021 0132   GLUCOSEU 50 (A) 10/22/2021 0132   HGBUR SMALL (A) 10/22/2021 0132   BILIRUBINUR NEGATIVE 10/22/2021 0132   KETONESUR NEGATIVE 10/22/2021 0132   PROTEINUR 100 (A) 10/22/2021 0132   NITRITE NEGATIVE 10/22/2021 0132   LEUKOCYTESUR NEGATIVE 10/22/2021 0132   Sepsis Labs  Recent Labs  Lab 11/23/21 0827  WBC 9.2   Microbiology No results found for this or any previous visit (from the past 240 hour(s)).   Time coordinating discharge: Over 30 minutes  SIGNED:   Little Ishikawa, DO [facility] 11/28/2021, 2:33 PM Pager   If 7PM-7AM, please contact night-coverage [email or web address]

## 2021-11-28 NOTE — Plan of Care (Signed)
  Problem: Education: Goal: Knowledge of General Education information will improve Description: Including pain rating scale, medication(s)/side effects and non-pharmacologic comfort measures Outcome: Progressing   Problem: Health Behavior/Discharge Planning: Goal: Ability to manage health-related needs will improve Outcome: Progressing   Problem: Clinical Measurements: Goal: Ability to maintain clinical measurements within normal limits will improve Outcome: Progressing   Problem: Activity: Goal: Risk for activity intolerance will decrease Outcome: Progressing   Problem: Nutrition: Goal: Adequate nutrition will be maintained Outcome: Progressing   Problem: Coping: Goal: Level of anxiety will decrease Outcome: Progressing   Problem: Safety: Goal: Ability to remain free from injury will improve Outcome: Progressing   

## 2021-11-29 DIAGNOSIS — G9340 Encephalopathy, unspecified: Secondary | ICD-10-CM | POA: Diagnosis not present

## 2021-11-29 NOTE — Progress Notes (Signed)
Keeps having girlfriend to come out to the desk to ask for meds and other things for him.  It has been explained to have him use the call button and someone can assist him.  He did become upset and said that no one wants to help him.  I explained that we will gladly help him but we just need him to use the call button.  She keeps coming asking for his medications and she is reminded that he has already had them and I even gave them to him in front of her and I remind her that this is his medication so she can help remind him. She still comes out and says he is still asking for his medication, although he has already had his medication and she just comes so he won't get mad at her.  I did ask her to try to help remind him, as he is sending her out to the desk every few minutes for meds or something.  Also, when I spoke with them both to just explain about the use of the call button, they both became upset about it.  He said we just do not want to help him.  I explained I have been helping him and will always help him but I cannot give him meds when they are not due.  He even sends her to the desk to ask other people for his meds after I have already medicated him.

## 2021-11-29 NOTE — Discharge Instructions (Signed)

## 2021-11-29 NOTE — Discharge Summary (Signed)
Physician Discharge Summary  [name] YIR:[id number] DOB: [birth year] DOA: 10/21/2021  PCP: [name], MD  Admit date: 10/21/2021 Discharge date: 11/29/2021  Admitted From: Home Disposition: Home with [name], significant other  Recommendations for Outpatient Follow-up:  Follow up with PCP in 1-2 weeks  Home Health: None Equipment/Devices: None indicated  Discharge Condition: Stable CODE STATUS: Full Diet recommendation: Low-salt low-fat low-carb diet  Brief/Interim Summary: 56 year old WM PMHx Polysubstance abuse, Hepatitis C, presented to [facility] in [place] on 9/6 after being found down.  He was last known well at approximately 13:00 hours that day however EMS was called for wellness check at some time later on.  He was found to be down and unresponsive.  On EMS evaluation the patient was apneic with thready pulse.  He did not responded to intranasal Narcan.  He was bagged in route to the emergency department and subsequently intubated on arrival to the ED. CT head demonstrated cerebral edema without loss of white matter differentiation. He was transferred to [facility] for MRI. On 9/11 he was noted to have upper extremity edema on the right. Ultrasound demonstrated acute thrombosis in the mid and distal basilic vein. Hospital course also complicated by aspiration pneumonia. Subsequently transferred to [facility] on 9/13.  Please see below for list of incidents and events in the hospital, briefly patient was admitted on 10/14/2021 after being found down at home noted to have cerebellar edema with longstanding confusion encephalopathy and somewhat labile mood.  Patient's mental status has improved drastically over the past 6 weeks, most notably over the past 2 weeks patient's mental status has improved and is no longer requiring restraints, feeding himself, no longer requiring NG tube and is otherwise ambulating in the hallway independently although needs frequent  redirection given his confusion and sometimes word finding difficulty.  Lengthy discussion today on 11/28/2021 with patient's daughter [name] - he remains medically stable for discharge, now that he is more independent plan is to move forward with discharge to patient's girlfriend [name] who can provide around-the-clock care, provide meals and other incidentals the patient is not capable of managing on his own due to his recent illness/comorbid's as below.  Patient otherwise medically stable for discharge, patient's daughter [name] has confirmed she is okay with discharge to patient's significant other [name].  Awaiting site visit to ensure safe disposition location per daughter's wishes.   Events:  9/6 found down > present to [facility] ED. CT with bilateral cerebellar edema. 9/7 repeat CT normal Unable to be weaned from sedation 9/11 RUE DVT started on tx dose lovenox 9/13 transfer to [facility] for MRI 9/14 chest tube placed with intrapleural fibrinolytic administration 9/16 chest tube removed 9/17 patient extubated, remained agitated and started on precedex 9/19 Cortrak placed  10/4 Attempting to wean cortrak (increase PO intake more liberally over the next 24h per SLP recs) - once cortrak removed can start having reasonable discussion on disposition.  10/5 Cortrak accidentally removed - advancing diet as tolerated 10/6 - overnight agitation - haldol x1 given - continues to complain of "having a panic attack" but then describes reflux symptoms. 10/7-patient continues to have word finding difficulty, received benzos overnight as patient reportedly requested outpatient Valium which she has not been on and does not currently take.  We will attempt to limit CNS depressing medications given his ongoing mental status changes from previous baseline secondary to below. Palliative will follow along from a distance which is certainly reasonable given his stable nature. 10/8 - diet  continues to improve, initiate  hydroxyzine PRN 10/9 -patient continues to progress, remains impulsive, difficult to reorient but able to ambulate somewhat today with physical therapy, recommendations pending but certainly approaching safe disposition to facility.  Unsafe discharge home alone or with family given need for 24-hour care and monitoring given his current mental status. 10/10 Patient now being evaluated for CIR placement - not appropriate for this level of care per CIR - (remains very impulsive and requires 24h monitoring but more redirectable as of late). PO intake markedly improving - no indication for PEG tube at this time (ate 100% of breakfast/lunch with minimal assistance). 10/15 Psych re-consulted for increasing agitation - recommending hydroxyzine, haldol, carbamazepine, seroquel - signed off as of 10/19 10/20 -ongoing.  Patient continues to improve daily, more independent ambulating independently feeding himself, minimal episodes of labile mood and outbursts but certainly not yet back to baseline and unsafe discharge on his own.   Subjective/Interval History: Agitation improving over the last 48h, patient more appropriate - continues to call his family to discuss dispo situation.   Assessment/Plan:   Acute toxic metabolic encephalopathy with concurrent ischemic stroke, stablizing, POA: -In the setting of acute stroke/toxic encephalopathy versus possible previous anoxic event as patient was found down in the field of unclear etiology and duration -with cocaine positive UDS Seen by neurology.  Patient was given high-dose thiamine. Now on once a day thiamine. -Per neurology high suspicion for anoxic brain injury versus toxin induced encephalopathy and ischemic strokes given negative EEG. - Continue hydroxyzine/haldol/carbamazepine/seroquel per Psych (re-consulted 10/15 for worsening agitation)   Multifocal cerebral infarcts: -Neurology recommended to start aspirin once patient is off of anticoagulation (presumed  DVT).  He will come off anticoagulation on December 11. -High risk for falls, discussed fall risk with family, safe disposition is paramount as below   Acute hypoxic respiratory failure secondary to aspiration pneumonia, resolved Right-sided aspiration pneumonia with parapneumonic effusion: -Status post chest tube placement with intrapleural lytics on 9/14, chest tube removed 9/16. -Completed 7 days of Zosyn -Intubated on admission outside hospital 9/6. -Extubated 9/17. Respiratory status is stable.   Superficial vein thrombosis 3/29 Right basilic vein thrombus -Due to increase risk for progression of thrombosis patient was started on Lovenox per previous discussion with oncology (Dr. Alen Blew). Patient has been transitioned to apixaban. Plan for 3 months of treatment which will be until December 11.   Dysphagia/nutrition:  Initially was found to be with significant oropharyngeal dysphagia.  Required tube feedings.  PEG was being considered however then his oral intake started improving.  No indication for PEG tube at this time.   Thrombocytosis: Resolved   Normocytic anemia Slight drop in hemoglobin was noted.  Hemoglobin noted to be stable this morning.  Check every few days.   Mild liver dysfunction/abnormal LFTs History of hepatitis C, improved.   Diarrhea, resolved:    Severe protein calorie malnutrition Nutrition Problem: Severe Malnutrition Etiology: social / environmental circumstances (drug abuse) Signs/Symptoms: severe fat depletion, severe muscle depletion   Goals of care  Previous providers have had lengthy conversation with the patient's daughter. Family expectations is that patient will continue to recover back to previous baseline.  This was felt to be unlikely.  -Prior disposition was somewhat limited by NG tube placement with prior concern for possible PEG tube placement, now that dietary intake is markedly improving there is no further need to pursue PEG  placement.     Discharge Diagnoses:  Principal Problem:   Acute encephalopathy Active Problems:  Fever   Pleural effusion   Deep venous thrombosis (HCC)   Acute respiratory failure with hypoxia (HCC)   Cerebrovascular accident (CVA) (HCC)   Protein-calorie malnutrition, severe   Anoxic brain injury (HCC)   Intermittent explosive disorder    Discharge Instructions   Allergies as of 11/29/2021   No Known Allergies      Medication List     STOP taking these medications    Sofosbuvir-Velpatasvir 400-100 MG Tabs Commonly known as: Epclusa       TAKE these medications    acetaminophen 325 MG tablet Commonly known as: TYLENOL Take 2 tablets (650 mg total) by mouth every 6 (six) hours as needed for fever or mild pain.   amLODipine 5 MG tablet Commonly known as: NORVASC Take 1 tablet (5 mg total) by mouth daily.   apixaban 5 MG Tabs tablet Commonly known as: ELIQUIS Take 1 tablet (5 mg total) by mouth 2 (two) times daily.   calcium carbonate 500 MG chewable tablet Commonly known as: TUMS - dosed in mg elemental calcium Chew 1 tablet (200 mg of elemental calcium total) by mouth 2 (two) times daily as needed for indigestion or heartburn.   feeding supplement Liqd Take 237 mLs by mouth 2 (two) times daily between meals.   hydrOXYzine 25 MG tablet Commonly known as: ATARAX Take 1 tablet (25 mg total) by mouth every 6 (six) hours as needed for anxiety.   lidocaine 5 % Commonly known as: LIDODERM Place 1 patch onto the skin daily. Remove & Discard patch within 12 hours or as directed by MD   melatonin 5 MG Tabs Take 1 tablet (5 mg total) by mouth at bedtime.   ondansetron 4 MG disintegrating tablet Commonly known as: ZOFRAN-ODT Take 1 tablet (4 mg total) by mouth every 8 (eight) hours as needed for nausea or vomiting.   pantoprazole 40 MG tablet Commonly known as: PROTONIX Take 1 tablet (40 mg total) by mouth daily at 12 noon.   QUEtiapine 100 MG  tablet Commonly known as: SEROQUEL Take 1 tablet (100 mg total) by mouth at bedtime.   QUEtiapine 25 MG tablet Commonly known as: SEROQUEL Take 1 tablet (25 mg total) by mouth 2 (two) times daily at 10 AM and 5 PM.   thiamine 100 MG tablet Commonly known as: Vitamin B-1 Take 1 tablet (100 mg total) by mouth daily.        No Known Allergies  Consultations: PCCM, neurology, psych, palliative care  Procedures/Studies: DG Swallowing Func-Speech Pathology  Result Date: 11/04/2021 Table formatting from the original result was not included. Images from the original result were not included. Objective Swallowing Evaluation: Type of Study: MBS-Modified Barium Swallow Study  Patient Details Name: [name] MRN: [id number] Date of Birth: 02/26/1965 Today's Date: 11/04/2021 Time: SLP Start Time (ACUTE ONLY): 0830 -SLP Stop Time (ACUTE ONLY): 0900 SLP Time Calculation (min) (ACUTE ONLY): 30 min Past Medical History: Past Medical History: Diagnosis Date  Acid reflux   Head injury   Hepatitis C   Hiatal hernia  Past Surgical History: Past Surgical History: Procedure Laterality Date  ACROMIO-CLAVICULAR JOINT REPAIR Right 06/28/2018  Procedure: ACROMIO-CLAVICULAR JOINT IRRIGATION AND DEBRIDEMENT;  Surgeon: Cammy Copa, MD;  Location: [facility] OR;  Service: Orthopedics;  Laterality: Right;  FACIAL FRACTURE SURGERY    IRRIGATION AND DEBRIDEMENT SHOULDER Right 06/28/2018  Procedure: IRRIGATION AND DEBRIDEMENT SHOULDER;  Surgeon: Cammy Copa, MD;  Location: [facility] OR;  Service: Orthopedics;  Laterality: Right;  KNEE ARTHROSCOPY Left  06/23/2018  Procedure: LEFT KNEE ARTHROSCOPY KNEE I&D.;  Surgeon: Cammy Copa, MD;  Location: [facility] OR;  Service: Orthopedics;  Laterality: Left; HPI: 56 year old male with past medical history significant for polysubstance abuse, hepatitis C, and GERD who presented to [facility] health in [place] on 9/6 after being found down. He was bagged in route to the emergency  department at which he arrived around 2300 hrs.  He was intubated immediately upon arrival to the ED 9/6.  CT of the head demonstrated cerebral edema without loss of white matter differentiation.  MRI shows Multifocal, primarily small acute infarcts involving bilateral cerebral hemispheres.. Extubated 9/18.  No data recorded  Recommendations for follow up therapy are one component of a multi-disciplinary discharge planning process, led by the attending physician.  Recommendations may be updated based on patient status, additional functional criteria and insurance authorization. Assessment / Plan / Recommendation   11/04/2021   9:00 AM Clinical Impressions Clinical Impression Pt demonstrates impaired cognition and awareness, but function is better today than all prior sessions. Pt accepted all trials with total assisted feeding, verbalized intermittently in response to staff present. Pt able to take sips of thin liquid with a straw and masticate solids. Oral phase at times prolonged. Pts position facilitated increased safety; his head was in a forward/chin tucked position, allowing any slight delay in swallow initiation to be compensated for well. There were also instances of mild oral residue and spill to pharynx that pt consistently cleared. No penetration or aspiration occurred. Recommend pt initiate a dys 3 soft) diet with thin liquids and total assisted feeding with aspiration precautions, particularly upright posture. Hopeful that access to PO will continue to encourage attention and awareness to environment. Need for ongoing enteral feeding to be determined by pts oral intake. LIkely will need meds crushed in puree. SLP Visit Diagnosis Dysphagia, oropharyngeal phase (R13.12) Impact on safety and function Mild aspiration risk;Risk for inadequate nutrition/hydration     11/04/2021   9:00 AM Treatment Recommendations Treatment Recommendations Therapy as outlined in treatment plan below      No data to display       11/04/2021   9:00 AM Diet Recommendations SLP Diet Recommendations Dysphagia 3 (Mech soft) solids;Thin liquid Liquid Administration via Cup;Straw Medication Administration Crushed with puree Compensations Slow rate;Small sips/bites;Minimize environmental distractions Postural Changes Seated upright at 90 degrees     11/04/2021   9:00 AM Other Recommendations Oral Care Recommendations Oral care BID Other Recommendations Have oral suction available Assistance recommended at discharge Frequent or constant Supervision/Assistance Functional Status Assessment Patient has had a recent decline in their functional status and demonstrates the ability to make significant improvements in function in a reasonable and predictable amount of time.   11/04/2021   9:00 AM Frequency and Duration  Speech Therapy Frequency (ACUTE ONLY) min 2x/week Treatment Duration 2 weeks     11/04/2021   9:00 AM Oral Phase Oral Phase Impaired Oral - Thin Cup Delayed oral transit Oral - Thin Straw Delayed oral transit Oral - Puree Delayed oral transit Oral - Regular Delayed oral transit;Decreased bolus cohesion    11/04/2021   9:00 AM Pharyngeal Phase Pharyngeal Phase Impaired Pharyngeal- Thin Cup Delayed swallow initiation-vallecula Pharyngeal- Thin Straw Delayed swallow initiation-vallecula Pharyngeal- Puree Delayed swallow initiation-vallecula Pharyngeal- Regular Delayed swallow initiation-vallecula     No data to display    DeBlois, Riley Nearing 11/04/2021, 9:25 AM  Subjective: No acute issues or events overnight   Discharge Exam: Vitals:   11/28/21 2000 11/29/21 0545  BP: (!) 140/91 124/84  Pulse:  90  Resp: 18 16  Temp: 97.9 F (36.6 C) 98 F (36.7 C)  SpO2: 98% 98%   Vitals:   11/28/21 1613 11/28/21 1935 11/28/21 2000 11/29/21 0545  BP: (!) 123/93 (!) 133/90 (!) 140/91 124/84  Pulse: 99 100  90  Resp: 17 18 18 16   Temp: 98.3 F (36.8 C) 97.9 F (36.6 C) 97.9 F (36.6 C) 98 F (36.7 C)  TempSrc:  Oral Oral Oral Oral  SpO2: 98% 98% 98% 98%  Weight:      Height:        General: Pt is alert, awake, not in acute distress Cardiovascular: RRR, S1/S2 +, no rubs, no gallops Respiratory: CTA bilaterally, no wheezing, no rhonchi Abdominal: Soft, NT, ND, bowel sounds + Extremities: no edema, no cyanosis    The results of significant diagnostics from this hospitalization (including imaging, microbiology, ancillary and laboratory) are listed below for reference.     Microbiology: No results found for this or any previous visit (from the past 240 hour(s)).   Labs: BNP (last 3 results) No results for input(s): "BNP" in the last 8760 hours. Basic Metabolic Panel: Recent Labs  Lab 11/23/21 0827  NA 136  K 3.9  CL 105  CO2 25  GLUCOSE 78  BUN 19  CREATININE 0.76  CALCIUM 9.2    Liver Function Tests: Recent Labs  Lab 11/23/21 0827  AST 23  ALT 44  ALKPHOS 92  BILITOT 0.1*  PROT 6.6  ALBUMIN 3.5    No results for input(s): "LIPASE", "AMYLASE" in the last 168 hours. No results for input(s): "AMMONIA" in the last 168 hours. CBC: Recent Labs  Lab 11/23/21 0827  WBC 9.2  HGB 12.5*  HCT 37.8*  MCV 95.7  PLT 311    Cardiac Enzymes: No results for input(s): "CKTOTAL", "CKMB", "CKMBINDEX", "TROPONINI" in the last 168 hours. BNP: Invalid input(s): "POCBNP" CBG: No results for input(s): "GLUCAP" in the last 168 hours. D-Dimer No results for input(s): "DDIMER" in the last 72 hours. Hgb A1c No results for input(s): "HGBA1C" in the last 72 hours. Lipid Profile No results for input(s): "CHOL", "HDL", "LDLCALC", "TRIG", "CHOLHDL", "LDLDIRECT" in the last 72 hours. Thyroid function studies No results for input(s): "TSH", "T4TOTAL", "T3FREE", "THYROIDAB" in the last 72 hours.  Invalid input(s): "FREET3" Anemia work up No results for input(s): "VITAMINB12", "FOLATE", "FERRITIN", "TIBC", "IRON", "RETICCTPCT" in the last 72 hours. Urinalysis    Component Value  Date/Time   COLORURINE AMBER (A) 10/22/2021 0132   APPEARANCEUR CLEAR 10/22/2021 0132   LABSPEC 1.027 10/22/2021 0132   PHURINE 7.0 10/22/2021 0132   GLUCOSEU 50 (A) 10/22/2021 0132   HGBUR SMALL (A) 10/22/2021 0132   BILIRUBINUR NEGATIVE 10/22/2021 0132   KETONESUR NEGATIVE 10/22/2021 0132   PROTEINUR 100 (A) 10/22/2021 0132   NITRITE NEGATIVE 10/22/2021 0132   LEUKOCYTESUR NEGATIVE 10/22/2021 0132   Sepsis Labs Recent Labs  Lab 11/23/21 0827  WBC 9.2    Microbiology No results found for this or any previous visit (from the past 240 hour(s)).   Time coordinating discharge: Over 30 minutes  SIGNED:   Little Ishikawa, DO [facility] 11/29/2021, 7:47 AM Pager   If 7PM-7AM, please contact night-coverage [email or web address]

## 2021-11-30 ENCOUNTER — Other Ambulatory Visit (HOSPITAL_COMMUNITY): Payer: Self-pay

## 2021-11-30 DIAGNOSIS — G9340 Encephalopathy, unspecified: Secondary | ICD-10-CM | POA: Diagnosis not present

## 2021-11-30 MED ORDER — AMLODIPINE BESYLATE 5 MG PO TABS
5.0000 mg | ORAL_TABLET | Freq: Every day | ORAL | 5 refills | Status: DC
  Filled 2021-11-30: qty 30, 30d supply, fill #0

## 2021-11-30 MED ORDER — ONDANSETRON 4 MG PO TBDP
4.0000 mg | ORAL_TABLET | Freq: Three times a day (TID) | ORAL | 0 refills | Status: DC | PRN
  Filled 2021-11-30: qty 20, 7d supply, fill #0

## 2021-11-30 MED ORDER — THIAMINE HCL 100 MG PO TABS
100.0000 mg | ORAL_TABLET | Freq: Every day | ORAL | 5 refills | Status: DC
  Filled 2021-11-30: qty 30, 30d supply, fill #0

## 2021-11-30 MED ORDER — MELATONIN 5 MG PO TABS
5.0000 mg | ORAL_TABLET | Freq: Every day | ORAL | 5 refills | Status: DC
  Filled 2021-11-30: qty 30, 30d supply, fill #0

## 2021-11-30 MED ORDER — APIXABAN 5 MG PO TABS
5.0000 mg | ORAL_TABLET | Freq: Two times a day (BID) | ORAL | 0 refills | Status: DC
  Filled 2021-11-30: qty 102, 51d supply, fill #0

## 2021-11-30 MED ORDER — QUETIAPINE FUMARATE 100 MG PO TABS
100.0000 mg | ORAL_TABLET | Freq: Every day | ORAL | 5 refills | Status: DC
  Filled 2021-11-30: qty 30, 30d supply, fill #0

## 2021-11-30 MED ORDER — ENSURE ENLIVE PO LIQD
1.0000 | Freq: Two times a day (BID) | ORAL | 2 refills | Status: DC
  Filled 2021-11-30: qty 14320, 30d supply, fill #0

## 2021-11-30 MED ORDER — ACETAMINOPHEN 325 MG PO TABS
650.0000 mg | ORAL_TABLET | Freq: Four times a day (QID) | ORAL | 5 refills | Status: DC | PRN
  Filled 2021-11-30: qty 30, 4d supply, fill #0

## 2021-11-30 MED ORDER — CALCIUM CARBONATE ANTACID 500 MG PO CHEW
1.0000 | CHEWABLE_TABLET | Freq: Two times a day (BID) | ORAL | 0 refills | Status: DC | PRN
  Filled 2021-11-30: qty 60, 30d supply, fill #0

## 2021-11-30 MED ORDER — HYDROXYZINE HCL 25 MG PO TABS
25.0000 mg | ORAL_TABLET | Freq: Four times a day (QID) | ORAL | 0 refills | Status: DC | PRN
  Filled 2021-11-30: qty 30, 8d supply, fill #0

## 2021-11-30 MED ORDER — PANTOPRAZOLE SODIUM 40 MG PO TBEC
40.0000 mg | DELAYED_RELEASE_TABLET | Freq: Every day | ORAL | 5 refills | Status: DC
  Filled 2021-11-30: qty 30, 30d supply, fill #0

## 2021-11-30 MED ORDER — QUETIAPINE FUMARATE 25 MG PO TABS
25.0000 mg | ORAL_TABLET | Freq: Two times a day (BID) | ORAL | 5 refills | Status: DC
  Filled 2021-11-30: qty 60, 30d supply, fill #0

## 2021-11-30 NOTE — Progress Notes (Signed)
Discharge instructions printed and reviewed with patient.  Patient verbalizes understanding and has no questions.

## 2021-11-30 NOTE — TOC Transition Note (Signed)
Transition of Care [facility]) - CM/SW Discharge Note   Patient Details  Name: [name] MRN: [id number] Date of Birth: 05-26-1965  [facility] CM/SW Contact:  Clifford Friar, RN Phone Number: 11/30/2021, 1:06 PM   Clinical Narrative:    Pt is ambulating 160 feet min guard and is walking all over the unit. Pts girlfriend has decided she can take him to her home and manage his care. CM spoke with his daughter [name] and she is in agreement.  Girlfriend [name] lives at: [id number] [address]    phone: [phone] Pts friend that was to provide transportation home is not be associated with the patient due to charges pending. CM has updated the girlfriend and pt and CM will provide a cab to the girlfriends home.  Daughter requested that APS be called at d/c to make sure the home situation he is discharging to is safe. CM has called a report into [facility].  CM has gone over the importance of over seeing the patient's medications with [name] and that he doesn't have access to take them himself. She verbalized understanding and states she will make sure he gets them correctly. She is aware he should not be driving. She is aware he needs supervision for his ADL's.  Medications to be delivered to the room per [place].    Final next level of care: Home/Self Care Barriers to Discharge: No Barriers Identified   Patient Goals and CMS Choice Patient states their goals for this hospitalization and ongoing recovery are:: patient unable to participate in goal setting, not fully oriented CMS Medicare.gov Compare Post Acute Care list provided to:: Patient Represenative (must comment) Choice offered to / list presented to : Adult Children  Discharge Placement                       Discharge Plan and Services     Post Acute Care Choice: IP Rehab                               Social Determinants of Health (SDOH) Interventions     Readmission Risk  Interventions     No data to display

## 2021-11-30 NOTE — Discharge Summary (Signed)
Physician Discharge Summary  [name] GEX:[id number] DOB: 06-Nov-1965 DOA: 10/21/2021  PCP: [name], MD  Admit date: 10/21/2021 Discharge date: 11/30/2021  Admitted From: Home Disposition: Home with [name], significant other  Recommendations for Outpatient Follow-up:  Follow up with PCP in 1-2 weeks  Home Health: None Equipment/Devices: None indicated  Discharge Condition: Stable CODE STATUS: Full Diet recommendation: Low-salt low-fat low-carb diet  Brief/Interim Summary: 56 year old WM PMHx Polysubstance abuse, Hepatitis C, presented to [facility] in [place] on 9/6 after being found down.  He was last known well at approximately 13:00 hours that day however EMS was called for wellness check at some time later on.  He was found to be down and unresponsive.  On EMS evaluation the patient was apneic with thready pulse.  He did not responded to intranasal Narcan.  He was bagged in route to the emergency department and subsequently intubated on arrival to the ED. CT head demonstrated cerebral edema without loss of white matter differentiation. He was transferred to [facility] for MRI. On 9/11 he was noted to have upper extremity edema on the right. Ultrasound demonstrated acute thrombosis in the mid and distal basilic vein. Hospital course also complicated by aspiration pneumonia. Subsequently transferred to [facility] on 9/13.  Please see below for list of incidents and events in the hospital, briefly patient was admitted on 10/14/2021 after being found down at home noted to have cerebellar edema with longstanding confusion encephalopathy and somewhat labile mood.  Patient's mental status has improved drastically over the past 6 weeks, most notably over the past 2 weeks patient's mental status has improved and is no longer requiring restraints, feeding himself, no longer requiring NG tube and is otherwise ambulating in the hallway independently although needs frequent  redirection given his confusion and sometimes word finding difficulty.  Lengthy discussion today on 11/28/2021 with patient's daughter [name] - he remains medically stable for discharge, now that he is more independent plan is to move forward with discharge to patient's girlfriend [name] who can provide around-the-clock care, provide meals and other incidentals the patient is not capable of managing on his own due to his recent illness/comorbid's as below.  Patient otherwise medically stable for discharge, patient's daughter [name] has confirmed she is okay with discharge to patient's significant other [name].  Awaiting site visit to ensure safe disposition location per daughter's wishes.   Events:  9/6 found down > present to [facility] ED. CT with bilateral cerebellar edema. 9/7 repeat CT normal Unable to be weaned from sedation 9/11 RUE DVT started on tx dose lovenox 9/13 transfer to [facility] for MRI 9/14 chest tube placed with intrapleural fibrinolytic administration 9/16 chest tube removed 9/17 patient extubated, remained agitated and started on precedex 9/19 Cortrak placed  10/4 Attempting to wean cortrak (increase PO intake more liberally over the next 24h per SLP recs) - once cortrak removed can start having reasonable discussion on disposition.  10/5 Cortrak accidentally removed - advancing diet as tolerated 10/6 - overnight agitation - haldol x1 given - continues to complain of "having a panic attack" but then describes reflux symptoms. 10/7-patient continues to have word finding difficulty, received benzos overnight as patient reportedly requested outpatient Valium which she has not been on and does not currently take.  We will attempt to limit CNS depressing medications given his ongoing mental status changes from previous baseline secondary to below. Palliative will follow along from a distance which is certainly reasonable given his stable nature. 10/8 - diet  continues to improve, initiate  hydroxyzine PRN 10/9 -patient continues to progress, remains impulsive, difficult to reorient but able to ambulate somewhat today with physical therapy, recommendations pending but certainly approaching safe disposition to facility.  Unsafe discharge home alone or with family given need for 24-hour care and monitoring given his current mental status. 10/10 Patient now being evaluated for CIR placement - not appropriate for this level of care per CIR - (remains very impulsive and requires 24h monitoring but more redirectable as of late). PO intake markedly improving - no indication for PEG tube at this time (ate 100% of breakfast/lunch with minimal assistance). 10/15 Psych re-consulted for increasing agitation - recommending hydroxyzine, haldol, carbamazepine, seroquel - signed off as of 10/19 10/20 -ongoing.  Patient continues to improve daily, more independent ambulating independently feeding himself, minimal episodes of labile mood and outbursts but certainly not yet back to baseline and unsafe discharge on his own.   Subjective/Interval History: Agitation improving over the last 48h, patient more appropriate - continues to call his family to discuss dispo situation.   Assessment/Plan:   Acute toxic metabolic encephalopathy with concurrent ischemic stroke, stablizing, POA: -In the setting of acute stroke/toxic encephalopathy versus possible previous anoxic event as patient was found down in the field of unclear etiology and duration -with cocaine positive UDS Seen by neurology.  Patient was given high-dose thiamine. Now on once a day thiamine. -Per neurology high suspicion for anoxic brain injury versus toxin induced encephalopathy and ischemic strokes given negative EEG. - Continue hydroxyzine/haldol/carbamazepine/seroquel per Psych (re-consulted 10/15 for worsening agitation)   Multifocal cerebral infarcts: -Neurology recommended to start aspirin once patient is off of anticoagulation (presumed  DVT).  He will come off anticoagulation on December 11. -High risk for falls, discussed fall risk with family, safe disposition is paramount as below   Acute hypoxic respiratory failure secondary to aspiration pneumonia, resolved Right-sided aspiration pneumonia with parapneumonic effusion: -Status post chest tube placement with intrapleural lytics on 9/14, chest tube removed 9/16. -Completed 7 days of Zosyn -Intubated on admission outside hospital 9/6. -Extubated 9/17. Respiratory status is stable.   Superficial vein thrombosis 3/29 Right basilic vein thrombus -Due to increase risk for progression of thrombosis patient was started on Lovenox per previous discussion with oncology (Dr. Alen Blew). Patient has been transitioned to apixaban. Plan for 3 months of treatment which will be until December 11.   Dysphagia/nutrition:  Initially was found to be with significant oropharyngeal dysphagia.  Required tube feedings.  PEG was being considered however then his oral intake started improving.  No indication for PEG tube at this time.   Thrombocytosis: Resolved   Normocytic anemia Slight drop in hemoglobin was noted.  Hemoglobin noted to be stable this morning.  Check every few days.   Mild liver dysfunction/abnormal LFTs History of hepatitis C, improved.   Diarrhea, resolved:    Severe protein calorie malnutrition Nutrition Problem: Severe Malnutrition Etiology: social / environmental circumstances (drug abuse) Signs/Symptoms: severe fat depletion, severe muscle depletion   Goals of care  Previous providers have had lengthy conversation with the patient's daughter. Family expectations is that patient will continue to recover back to previous baseline.  This was felt to be unlikely.  -Prior disposition was somewhat limited by NG tube placement with prior concern for possible PEG tube placement, now that dietary intake is markedly improving there is no further need to pursue PEG  placement.     Discharge Diagnoses:  Principal Problem:   Acute encephalopathy Active Problems:  Fever   Pleural effusion   Deep venous thrombosis (HCC)   Acute respiratory failure with hypoxia (HCC)   Cerebrovascular accident (CVA) (HCC)   Protein-calorie malnutrition, severe   Anoxic brain injury (HCC)   Intermittent explosive disorder    Discharge Instructions   Allergies as of 11/30/2021   No Known Allergies      Medication List     STOP taking these medications    Sofosbuvir-Velpatasvir 400-100 MG Tabs Commonly known as: Epclusa       TAKE these medications    acetaminophen 325 MG tablet Commonly known as: TYLENOL Take 2 tablets (650 mg total) by mouth every 6 (six) hours as needed for fever or mild pain.   amLODipine 5 MG tablet Commonly known as: NORVASC Take 1 tablet (5 mg total) by mouth daily.   apixaban 5 MG Tabs tablet Commonly known as: ELIQUIS Take 1 tablet (5 mg total) by mouth 2 (two) times daily.   calcium carbonate 500 MG chewable tablet Commonly known as: TUMS - dosed in mg elemental calcium Chew 1 tablet (200 mg of elemental calcium total) by mouth 2 (two) times daily as needed for indigestion or heartburn.   feeding supplement Liqd Take 237 mLs by mouth 2 (two) times daily between meals.   hydrOXYzine 25 MG tablet Commonly known as: ATARAX Take 1 tablet (25 mg total) by mouth every 6 (six) hours as needed for anxiety.   melatonin 5 MG Tabs Take 1 tablet (5 mg total) by mouth at bedtime.   ondansetron 4 MG disintegrating tablet Commonly known as: ZOFRAN-ODT Take 1 tablet (4 mg total) by mouth every 8 (eight) hours as needed for nausea or vomiting.   pantoprazole 40 MG tablet Commonly known as: PROTONIX Take 1 tablet (40 mg total) by mouth daily at 12 noon.   QUEtiapine 100 MG tablet Commonly known as: SEROQUEL Take 1 tablet (100 mg total) by mouth at bedtime.   QUEtiapine 25 MG tablet Commonly known as: SEROQUEL Take  1 tablet (25 mg total) by mouth 2 (two) times daily at 10 AM and 5 PM.   thiamine 100 MG tablet Commonly known as: Vitamin B-1 Take 1 tablet (100 mg total) by mouth daily.        No Known Allergies  Consultations: PCCM, neurology, psych, palliative care  Procedures/Studies: DG Swallowing Func-Speech Pathology  Result Date: 11/04/2021 Table formatting from the original result was not included. Images from the original result were not included. Objective Swallowing Evaluation: Type of Study: MBS-Modified Barium Swallow Study  Patient Details Name: Elian Gloster MRN: [id number] Date of Birth: 06-28-1965 Today's Date: 11/04/2021 Time: SLP Start Time (ACUTE ONLY): 0830 -SLP Stop Time (ACUTE ONLY): 0900 SLP Time Calculation (min) (ACUTE ONLY): 30 min Past Medical History: Past Medical History: Diagnosis Date  Acid reflux   Head injury   Hepatitis C   Hiatal hernia  Past Surgical History: Past Surgical History: Procedure Laterality Date  ACROMIO-CLAVICULAR JOINT REPAIR Right 06/28/2018  Procedure: ACROMIO-CLAVICULAR JOINT IRRIGATION AND DEBRIDEMENT;  Surgeon: Cammy Copa, MD;  Location: [facility] OR;  Service: Orthopedics;  Laterality: Right;  FACIAL FRACTURE SURGERY    IRRIGATION AND DEBRIDEMENT SHOULDER Right 06/28/2018  Procedure: IRRIGATION AND DEBRIDEMENT SHOULDER;  Surgeon: Cammy Copa, MD;  Location: [facility] OR;  Service: Orthopedics;  Laterality: Right;  KNEE ARTHROSCOPY Left 06/23/2018  Procedure: LEFT KNEE ARTHROSCOPY KNEE I&D.;  Surgeon: Cammy Copa, MD;  Location: [facility] OR;  Service: Orthopedics;  Laterality: Left; HPI: 56 year old male with  past medical history significant for polysubstance abuse, hepatitis C, and GERD who presented to [facility] health in [place] on 9/6 after being found down. He was bagged in route to the emergency department at which he arrived around 2300 hrs.  He was intubated immediately upon arrival to the ED 9/6.  CT of the head demonstrated cerebral edema  without loss of white matter differentiation.  MRI shows Multifocal, primarily small acute infarcts involving bilateral cerebral hemispheres.. Extubated 9/18.  No data recorded  Recommendations for follow up therapy are one component of a multi-disciplinary discharge planning process, led by the attending physician.  Recommendations may be updated based on patient status, additional functional criteria and insurance authorization. Assessment / Plan / Recommendation   11/04/2021   9:00 AM Clinical Impressions Clinical Impression Pt demonstrates impaired cognition and awareness, but function is better today than all prior sessions. Pt accepted all trials with total assisted feeding, verbalized intermittently in response to staff present. Pt able to take sips of thin liquid with a straw and masticate solids. Oral phase at times prolonged. Pts position facilitated increased safety; his head was in a forward/chin tucked position, allowing any slight delay in swallow initiation to be compensated for well. There were also instances of mild oral residue and spill to pharynx that pt consistently cleared. No penetration or aspiration occurred. Recommend pt initiate a dys 3 soft) diet with thin liquids and total assisted feeding with aspiration precautions, particularly upright posture. Hopeful that access to PO will continue to encourage attention and awareness to environment. Need for ongoing enteral feeding to be determined by pts oral intake. LIkely will need meds crushed in puree. SLP Visit Diagnosis Dysphagia, oropharyngeal phase (R13.12) Impact on safety and function Mild aspiration risk;Risk for inadequate nutrition/hydration     11/04/2021   9:00 AM Treatment Recommendations Treatment Recommendations Therapy as outlined in treatment plan below      No data to display      11/04/2021   9:00 AM Diet Recommendations SLP Diet Recommendations Dysphagia 3 (Mech soft) solids;Thin liquid Liquid Administration via Cup;Straw  Medication Administration Crushed with puree Compensations Slow rate;Small sips/bites;Minimize environmental distractions Postural Changes Seated upright at 90 degrees     11/04/2021   9:00 AM Other Recommendations Oral Care Recommendations Oral care BID Other Recommendations Have oral suction available Assistance recommended at discharge Frequent or constant Supervision/Assistance Functional Status Assessment Patient has had a recent decline in their functional status and demonstrates the ability to make significant improvements in function in a reasonable and predictable amount of time.   11/04/2021   9:00 AM Frequency and Duration  Speech Therapy Frequency (ACUTE ONLY) min 2x/week Treatment Duration 2 weeks     11/04/2021   9:00 AM Oral Phase Oral Phase Impaired Oral - Thin Cup Delayed oral transit Oral - Thin Straw Delayed oral transit Oral - Puree Delayed oral transit Oral - Regular Delayed oral transit;Decreased bolus cohesion    11/04/2021   9:00 AM Pharyngeal Phase Pharyngeal Phase Impaired Pharyngeal- Thin Cup Delayed swallow initiation-vallecula Pharyngeal- Thin Straw Delayed swallow initiation-vallecula Pharyngeal- Puree Delayed swallow initiation-vallecula Pharyngeal- Regular Delayed swallow initiation-vallecula     No data to display    DeBlois, Riley Nearing 11/04/2021, 9:25 AM                       Subjective: No acute issues or events overnight   Discharge Exam: Vitals:   11/30/21 0527 11/30/21 0936  BP: 115/78 (!) 117/93  Pulse: 87 95  Resp: 18 18  Temp: (!) 97.5 F (36.4 C) 97.7 F (36.5 C)  SpO2: 98% 98%   Vitals:   11/29/21 2012 11/30/21 0500 11/30/21 0527 11/30/21 0936  BP: 128/86  115/78 (!) 117/93  Pulse: 80  87 95  Resp: 18  18 18   Temp: 98.5 F (36.9 C)  (!) 97.5 F (36.4 C) 97.7 F (36.5 C)  TempSrc: Oral  Oral Oral  SpO2: 99%  98% 98%  Weight:  73.3 kg    Height:        General: Pt is alert, awake, not in acute distress Cardiovascular: RRR, S1/S2 +, no rubs,  no gallops Respiratory: CTA bilaterally, no wheezing, no rhonchi Abdominal: Soft, NT, ND, bowel sounds + Extremities: no edema, no cyanosis    The results of significant diagnostics from this hospitalization (including imaging, microbiology, ancillary and laboratory) are listed below for reference.     Microbiology: No results found for this or any previous visit (from the past 240 hour(s)).   Labs: BNP (last 3 results) No results for input(s): "BNP" in the last 8760 hours. Basic Metabolic Panel: No results for input(s): "NA", "K", "CL", "CO2", "GLUCOSE", "BUN", "CREATININE", "CALCIUM", "MG", "PHOS" in the last 168 hours. Liver Function Tests: No results for input(s): "AST", "ALT", "ALKPHOS", "BILITOT", "PROT", "ALBUMIN" in the last 168 hours. No results for input(s): "LIPASE", "AMYLASE" in the last 168 hours. No results for input(s): "AMMONIA" in the last 168 hours. CBC: No results for input(s): "WBC", "NEUTROABS", "HGB", "HCT", "MCV", "PLT" in the last 168 hours. Cardiac Enzymes: No results for input(s): "CKTOTAL", "CKMB", "CKMBINDEX", "TROPONINI" in the last 168 hours. BNP: Invalid input(s): "POCBNP" CBG: No results for input(s): "GLUCAP" in the last 168 hours. D-Dimer No results for input(s): "DDIMER" in the last 72 hours. Hgb A1c No results for input(s): "HGBA1C" in the last 72 hours. Lipid Profile No results for input(s): "CHOL", "HDL", "LDLCALC", "TRIG", "CHOLHDL", "LDLDIRECT" in the last 72 hours. Thyroid function studies No results for input(s): "TSH", "T4TOTAL", "T3FREE", "THYROIDAB" in the last 72 hours.  Invalid input(s): "FREET3" Anemia work up No results for input(s): "VITAMINB12", "FOLATE", "FERRITIN", "TIBC", "IRON", "RETICCTPCT" in the last 72 hours. Urinalysis    Component Value Date/Time   COLORURINE AMBER (A) 10/22/2021 0132   APPEARANCEUR CLEAR 10/22/2021 0132   LABSPEC 1.027 10/22/2021 0132   PHURINE 7.0 10/22/2021 0132   GLUCOSEU 50 (A)  10/22/2021 0132   HGBUR SMALL (A) 10/22/2021 0132   BILIRUBINUR NEGATIVE 10/22/2021 0132   KETONESUR NEGATIVE 10/22/2021 0132   PROTEINUR 100 (A) 10/22/2021 0132   NITRITE NEGATIVE 10/22/2021 0132   LEUKOCYTESUR NEGATIVE 10/22/2021 0132   Sepsis Labs No results for input(s): "WBC" in the last 168 hours.  Invalid input(s): "PROCALCITONIN", "LACTICIDVEN" Microbiology No results found for this or any previous visit (from the past 240 hour(s)).   Time coordinating discharge: Over 30 minutes  SIGNED:   Azucena FallenWilliam C Natsha Guidry, DO [facility] 11/30/2021, 12:21 PM Pager   If 7PM-7AM, please contact night-coverage [email or web address]

## 2021-12-01 ENCOUNTER — Telehealth: Payer: Self-pay | Admitting: Family Medicine

## 2021-12-01 NOTE — Telephone Encounter (Signed)
..   Medicaid Managed Care   Unsuccessful Outreach Note  12/01/2021 Name: [name] MRN: [id number] DOB: Jul 08, 1965  Referred by: [name], MD Reason for referral : High Risk Managed Medicaid (I called the patient today to get him scheduled with the MM RNCM and LCSW. I left my name and number on his SGO's voicemail.)   An unsuccessful telephone outreach was attempted today. The patient was referred to the case management team for assistance with care management and care coordination.   Follow Up Plan: The care management team will reach out to the patient again over the next 5 days.    [place]

## 2021-12-06 DIAGNOSIS — Z79899 Other long term (current) drug therapy: Secondary | ICD-10-CM | POA: Diagnosis not present

## 2021-12-06 DIAGNOSIS — Z789 Other specified health status: Secondary | ICD-10-CM | POA: Diagnosis not present

## 2021-12-06 DIAGNOSIS — Z7901 Long term (current) use of anticoagulants: Secondary | ICD-10-CM | POA: Diagnosis not present

## 2021-12-06 DIAGNOSIS — K4090 Unilateral inguinal hernia, without obstruction or gangrene, not specified as recurrent: Secondary | ICD-10-CM | POA: Diagnosis not present

## 2021-12-06 DIAGNOSIS — I10 Essential (primary) hypertension: Secondary | ICD-10-CM | POA: Diagnosis not present

## 2021-12-09 DIAGNOSIS — Z419 Encounter for procedure for purposes other than remedying health state, unspecified: Secondary | ICD-10-CM | POA: Diagnosis not present

## 2021-12-16 ENCOUNTER — Other Ambulatory Visit: Payer: Self-pay | Admitting: Obstetrics and Gynecology

## 2021-12-16 ENCOUNTER — Encounter: Payer: Self-pay | Admitting: Obstetrics and Gynecology

## 2021-12-16 NOTE — Patient Outreach (Signed)
Medicaid Managed Care   Nurse Care Manager Note  12/16/2021 Name:  [name] MRN:  [id number] DOB:  01-14-1966  [name] is an 56 y.o. year old male who is a primary patient of Fulp, Hewitt Shorts, MD.  The [facility] Managed Care Coordination team was consulted for assistance with:    Chronic healthcare management needs, Hepatitis C, ischemic stroke, arthritis, anxiety, tobacco use, neuropathy  [name] was given information about Medicaid Managed Care Coordination team services today. [name] Patient agreed to services and verbal consent obtained.  Engaged with patient by telephone for initial visit in response to provider referral for case management and/or care coordination services.   Assessments/Interventions:  Review of past medical history, allergies, medications, health status, including review of consultants reports, laboratory and other test data, was performed as part of comprehensive evaluation and provision of chronic care management services.  SDOH (Social Determinants of Health) assessments and interventions performed: SDOH Interventions    Flowsheet Row Patient Outreach Telephone from 12/16/2021 in [place] Office Visit from 09/08/2018 in [facility]  SDOH Interventions    Alcohol Usage Interventions Intervention Not Indicated (Score <7) --  Depression Interventions/Treatment  -- Medication  Stress Interventions Intervention Not Indicated --     Care Plan  No Known Allergies  Medications Reviewed Today     Reviewed by [name], RN (Registered Nurse) on 12/16/21 at 1348  Med List Status: <None>   Medication Order Taking? Sig Documenting Provider Last Dose Status Informant  acetaminophen (TYLENOL) 325 MG tablet [id number]  Take 2 tablets (650 mg total) by mouth every 6 (six) hours as needed for fever or mild pain. Azucena Fallen, MD  Active   amLODipine (NORVASC) 5 MG tablet [id number]  Take  1 tablet (5 mg total) by mouth daily. Azucena Fallen, MD  Active   apixaban (ELIQUIS) 5 MG TABS tablet [id number]  Take 1 tablet (5 mg total) by mouth 2 (two) times daily. Azucena Fallen, MD  Active   calcium carbonate (TUMS - DOSED IN MG ELEMENTAL CALCIUM) 500 MG chewable tablet [id number]  Chew 1 tablet (200 mg of elemental calcium total) by mouth 2 (two) times daily as needed for indigestion or heartburn. Azucena Fallen, MD  Active   feeding supplement (ENSURE ENLIVE / ENSURE PLUS) LIQD [id number] No Take 237 mLs by mouth 2 (two) times daily between meals.  Patient not taking: Reported on 12/16/2021   Azucena Fallen, MD Not Taking Active   hydrOXYzine (ATARAX) 25 MG tablet [id number]  Take 1 tablet (25 mg total) by mouth every 6 (six) hours as needed for anxiety. Azucena Fallen, MD  Active   melatonin 5 MG TABS [id number]  Take 1 tablet (5 mg total) by mouth at bedtime. Azucena Fallen, MD  Active   ondansetron (ZOFRAN-ODT) 4 MG disintegrating tablet [id number]  Take 1 tablet (4 mg total) by mouth every 8 (eight) hours as needed for nausea or vomiting. Azucena Fallen, MD  Active   pantoprazole (PROTONIX) 40 MG tablet [id number]  Take 1 tablet (40 mg total) by mouth daily at 12 noon. Azucena Fallen, MD  Active   QUEtiapine (SEROQUEL) 100 MG tablet [id number]  Take 1 tablet (100 mg total) by mouth at bedtime. Azucena Fallen, MD  Active   QUEtiapine (SEROQUEL) 25 MG tablet [id number]  Take 1 tablet (25 mg total) by mouth 2 (two) times daily at 10 AM and 5  PM. Azucena Fallen, MD  Active   thiamine (VITAMIN B1) 100 MG tablet [id number]  Take 1 tablet (100 mg total) by mouth daily. Azucena Fallen, MD  Active            Patient Active Problem List   Diagnosis Date Noted   Intermittent explosive disorder 11/22/2021   Protein-calorie malnutrition, severe 10/31/2021   Anoxic brain injury (HCC) 10/31/2021   Acute respiratory failure with hypoxia  (HCC)    Cerebrovascular accident (CVA) (HCC)    Acute encephalopathy 10/22/2021   Fever    Pleural effusion    Deep venous thrombosis (HCC)    Pain and swelling of left lower leg 12/28/2018   Septic arthritis of shoulder, right (HCC) 09/07/2018   Septic arthritis of sacroiliac joint (HCC) 09/07/2018   Sternoclavicular joint pain, left 09/07/2018   Discitis of lumbosacral region 06/28/2018   Psoas abscess, right (HCC) 06/28/2018   Polysubstance abuse (HCC) 06/24/2018   Normocytic anemia 06/24/2018   Recurrent boils 06/24/2018   Head injury    Septic arthritis of knee, left (HCC)    Acute medial meniscal tear, left, initial encounter    GERD (gastroesophageal reflux disease) 06/22/2018   Mild protein-calorie malnutrition (HCC) 06/22/2018   AKI (acute kidney injury) (HCC) 06/21/2018   Spinal stenosis of lumbosacral region 06/21/2018   Chronic hepatitis C (HCC) 12/17/2013   Cigarette smoker 12/17/2013   Family history of diabetes mellitus (DM) 12/17/2013   Notalgia 12/17/2013   Conditions to be addressed/monitored per PCP order:  Chronic healthcare management needs, Hepatitis C, ischemic stroke, arthritis, anxiety, tobacco use, neuropathy  Care Plan : RN Care Manager Plan of Care  Updates made by [name], RN since 12/16/2021 12:00 AM     Problem: Health Promotion or Disease Self-Management (General Plan of Care)      Long-Range Goal: Chronic Disease Management   Start Date: 12/16/2021  Expected End Date: 03/18/2022  Priority: High  Note:   Current Barriers:  Knowledge Deficits related to plan of care for management of recent ischemic stroke  Chronic Disease Management support and education needs related to recent ischemic stroke   RNCM Clinical Goal(s):  Patient will verbalize understanding of plan for management of ischemic stroke  as evidenced by patient report and follow up  through collaboration with RN Care manager, provider, and care team.    Interventions: Inter-disciplinary care team collaboration (see longitudinal plan of care) Evaluation of current treatment plan related to  self management and patient's adherence to plan as established by provider  Stroke:  (Status:New goal.) Long Term Goal Reviewed Importance of taking all medications as prescribed Reviewed Importance of attending all scheduled provider appointments Advised to report any changes in symptoms or exercise tolerance Assessed social determinant of health barriers Assessed for signs and symptoms of stroke Assessed for cognitive impairment Assessed use of tobacco use  Patient Goals/Self-Care Activities: Take all medications as prescribed Attend all scheduled provider appointments Call pharmacy for medication refills 3-7 days in advance of running out of medications Perform all self care activities independently  Perform IADL's (shopping, preparing meals, housekeeping, managing finances) independently Call provider office for new concerns or questions   Follow Up Plan:  The patient has been provided with contact information for the care management team and has been advised to call with any health related questions or concerns.  The care management team will reach out to the patient again over the next 30 business  days.    Long-Range  Goal: Establish Plan of Care for Chronic Disease Managedment Needs   Priority: High  Note:   Timeframe:  Long-Range Goal Priority:  High Start Date:     12/16/21                        Expected End Date:  ongoing                     Follow Up Date 01/18/22    - schedule appointment for flu shot - schedule appointment for vaccines needed due to my age or health - schedule recommended health tests  - schedule and keep appointment for annual check-up    Why is this important?   Screening tests can find diseases early when they are easier to treat.  Your doctor or nurse will talk with you about which tests are important  for you.  Getting shots for common diseases like the flu and shingles will help prevent them.     Follow Up:  Patient agrees to Care Plan and Follow-up.  Plan: The Managed Medicaid care management team will reach out to the patient again over the next 30 business  days. and The  Patient has been provided with contact information for the Managed Medicaid care management team and has been advised to call with any health related questions or concerns.  Date/time of next scheduled RN care management/care coordination outreach: 01/18/22 at 230.

## 2021-12-16 NOTE — Patient Instructions (Signed)
Hi [name], thank you for speaking with me today-have a nice afternoon!  [name] was given information about Medicaid Managed Care team care coordination services as a part of their [facility] Medicaid benefit. [name] verbally consented to engagement with the [facility] Managed Care team.   If you are experiencing a medical emergency, please call 911 or report to your local emergency department or urgent care.   If you have a non-emergency medical problem during routine business hours, please contact your provider's office and ask to speak with a nurse.   For questions related to your [facility] health plan, please call: [phone] or go here:[email or web address]  If you would like to schedule transportation through your [facility] plan, please call the following number at least 2 days in advance of your appointment: [phone].  You can also use the MTM portal or MTM mobile app to manage your rides. For the portal, please go to [email or web address]  Call the [facility] Crisis Line at [phone], at any time, 24 hours a day, 7 days a week. If you are in danger or need immediate medical attention call 911.  If you would like help to quit smoking, call 1-800-QUIT-NOW (872-810-2650) OR Espaol: 1-855-Djelo-Ya (8-527-782-4235) o para ms informacin haga clic aqu or Text READY to 361-443 to register via text  [name] - following are the goals we discussed in your visit today:   Goals Addressed    Timeframe:  Long-Range Goal Priority:  High Start Date:     12/16/21                        Expected End Date:  ongoing                     Follow Up Date 01/18/22    - schedule appointment for flu shot - schedule appointment for vaccines needed due to my age or health - schedule recommended health tests  - schedule and keep appointment for annual check-up    Why is this important?   Screening tests can find diseases early when they are easier to  treat.  Your doctor or nurse will talk with you about which tests are important for you.  Getting shots for common diseases like the flu and shingles will help prevent them.  Patient verbalizes understanding of instructions and care plan provided today and agrees to view in MyChart. Active MyChart status and patient understanding of how to access instructions and care plan via MyChart confirmed with patient.     The Managed Medicaid care management team will reach out to the patient again over the next 30 business  days.  The  Patient  has been provided with contact information for the Managed Medicaid care management team and has been advised to call with any health related questions or concerns.   [name] RN, BSN [place]  [facility] Care Management Coordinator - Managed Medicaid High Risk [phone]   Following is a copy of your plan of care:  Care Plan : RN Care Manager Plan of Care  Updates made by Clifford Chandler, RN since 12/16/2021 12:00 AM     Problem: Health Promotion or Disease Self-Management (General Plan of Care)      Long-Range Goal: Chronic Disease Management   Start Date: 12/16/2021  Expected End Date: 03/18/2022  Priority: High  Note:   Current Barriers:  Knowledge Deficits related to plan of care for management of  recent ischemic stroke  Chronic Disease Management support and education needs related to recent ischemic stroke   RNCM Clinical Goal(s):  Patient will verbalize understanding of plan for management of ischemic stroke  as evidenced by patient report and follow up  through collaboration with RN Care manager, provider, and care team.   Interventions: Inter-disciplinary care team collaboration (see longitudinal plan of care) Evaluation of current treatment plan related to  self management and patient's adherence to plan as established by provider   Stroke:  (Status:New goal.) Long Term Goal Reviewed Importance of taking all  medications as prescribed Reviewed Importance of attending all scheduled provider appointments Advised to report any changes in symptoms or exercise tolerance Assessed social determinant of health barriers Assessed for signs and symptoms of stroke Assessed for cognitive impairment Assessed use of tobacco use  Patient Goals/Self-Care Activities: Take all medications as prescribed Attend all scheduled provider appointments Call pharmacy for medication refills 3-7 days in advance of running out of medications Perform all self care activities independently  Perform IADL's (shopping, preparing meals, housekeeping, managing finances) independently Call provider office for new concerns or questions   Follow Up Plan:  The patient has been provided with contact information for the care management team and has been advised to call with any health related questions or concerns.  The care management team will reach out to the patient again over the next 30 business  days.

## 2021-12-29 ENCOUNTER — Other Ambulatory Visit: Payer: Self-pay

## 2021-12-29 ENCOUNTER — Encounter (HOSPITAL_COMMUNITY): Payer: Self-pay

## 2021-12-29 ENCOUNTER — Emergency Department (HOSPITAL_COMMUNITY)
Admission: EM | Admit: 2021-12-29 | Discharge: 2021-12-29 | Discharge status: Against Medical Advice | Payer: Medicaid Other | Attending: Emergency Medicine | Admitting: Emergency Medicine

## 2021-12-29 DIAGNOSIS — R451 Restlessness and agitation: Secondary | ICD-10-CM | POA: Diagnosis not present

## 2021-12-29 DIAGNOSIS — W010XXA Fall on same level from slipping, tripping and stumbling without subsequent striking against object, initial encounter: Secondary | ICD-10-CM | POA: Diagnosis not present

## 2021-12-29 DIAGNOSIS — Z743 Need for continuous supervision: Secondary | ICD-10-CM | POA: Diagnosis not present

## 2021-12-29 DIAGNOSIS — Z79899 Other long term (current) drug therapy: Secondary | ICD-10-CM | POA: Insufficient documentation

## 2021-12-29 DIAGNOSIS — I499 Cardiac arrhythmia, unspecified: Secondary | ICD-10-CM | POA: Diagnosis not present

## 2021-12-29 DIAGNOSIS — Z5329 Procedure and treatment not carried out because of patient's decision for other reasons: Secondary | ICD-10-CM | POA: Insufficient documentation

## 2021-12-29 DIAGNOSIS — I10 Essential (primary) hypertension: Secondary | ICD-10-CM | POA: Diagnosis not present

## 2021-12-29 DIAGNOSIS — W19XXXA Unspecified fall, initial encounter: Secondary | ICD-10-CM

## 2021-12-29 DIAGNOSIS — R531 Weakness: Secondary | ICD-10-CM | POA: Diagnosis not present

## 2021-12-29 DIAGNOSIS — Z7901 Long term (current) use of anticoagulants: Secondary | ICD-10-CM | POA: Diagnosis not present

## 2021-12-29 DIAGNOSIS — Y9289 Other specified places as the place of occurrence of the external cause: Secondary | ICD-10-CM | POA: Diagnosis not present

## 2021-12-29 DIAGNOSIS — I959 Hypotension, unspecified: Secondary | ICD-10-CM | POA: Diagnosis not present

## 2021-12-29 DIAGNOSIS — S0990XA Unspecified injury of head, initial encounter: Secondary | ICD-10-CM | POA: Diagnosis not present

## 2021-12-29 DIAGNOSIS — F419 Anxiety disorder, unspecified: Secondary | ICD-10-CM | POA: Diagnosis not present

## 2021-12-29 DIAGNOSIS — R0689 Other abnormalities of breathing: Secondary | ICD-10-CM | POA: Diagnosis not present

## 2021-12-29 HISTORY — DX: Cerebral infarction, unspecified: I63.9

## 2021-12-29 LAB — CBC
HCT: 45.9 % (ref 39.0–52.0)
Hemoglobin: 15.2 g/dL (ref 13.0–17.0)
MCH: 31.3 pg (ref 26.0–34.0)
MCHC: 33.1 g/dL (ref 30.0–36.0)
MCV: 94.4 fL (ref 80.0–100.0)
Platelets: 326 10*3/uL (ref 150–400)
RBC: 4.86 MIL/uL (ref 4.22–5.81)
RDW: 14.6 % (ref 11.5–15.5)
WBC: 11.1 10*3/uL — ABNORMAL HIGH (ref 4.0–10.5)
nRBC: 0 % (ref 0.0–0.2)

## 2021-12-29 LAB — RAPID URINE DRUG SCREEN, HOSP PERFORMED
Amphetamines: NOT DETECTED
Barbiturates: NOT DETECTED
Benzodiazepines: NOT DETECTED
Cocaine: POSITIVE — AB
Opiates: NOT DETECTED
Tetrahydrocannabinol: NOT DETECTED

## 2021-12-29 LAB — URINALYSIS, ROUTINE W REFLEX MICROSCOPIC
Bilirubin Urine: NEGATIVE
Glucose, UA: NEGATIVE mg/dL
Hgb urine dipstick: NEGATIVE
Ketones, ur: NEGATIVE mg/dL
Leukocytes,Ua: NEGATIVE
Nitrite: NEGATIVE
Protein, ur: NEGATIVE mg/dL
Specific Gravity, Urine: 1.002 — ABNORMAL LOW (ref 1.005–1.030)
pH: 6 (ref 5.0–8.0)

## 2021-12-29 LAB — I-STAT VENOUS BLOOD GAS, ED
Acid-Base Excess: 0 mmol/L (ref 0.0–2.0)
Bicarbonate: 23.7 mmol/L (ref 20.0–28.0)
Calcium, Ion: 1.06 mmol/L — ABNORMAL LOW (ref 1.15–1.40)
HCT: 46 % (ref 39.0–52.0)
Hemoglobin: 15.6 g/dL (ref 13.0–17.0)
O2 Saturation: 97 %
Potassium: 3.9 mmol/L (ref 3.5–5.1)
Sodium: 141 mmol/L (ref 135–145)
TCO2: 25 mmol/L (ref 22–32)
pCO2, Ven: 36.5 mmHg — ABNORMAL LOW (ref 44–60)
pH, Ven: 7.42 (ref 7.25–7.43)
pO2, Ven: 86 mmHg — ABNORMAL HIGH (ref 32–45)

## 2021-12-29 LAB — PROTIME-INR
INR: 1 (ref 0.8–1.2)
Prothrombin Time: 13.2 seconds (ref 11.4–15.2)

## 2021-12-29 LAB — APTT: aPTT: 30 seconds (ref 24–36)

## 2021-12-29 LAB — ETHANOL: Alcohol, Ethyl (B): 154 mg/dL — ABNORMAL HIGH (ref <10)

## 2021-12-29 NOTE — ED Notes (Signed)
Notified EDP about pt chief complaint and story.

## 2021-12-29 NOTE — ED Notes (Addendum)
Pt ambulatory in hallway using urinal.

## 2021-12-29 NOTE — ED Notes (Signed)
Walked in to room. Patient is yelling saying he wants [place] home Girlfriend is at bedside trying to convince the patient to stay. Patient tried to rip out his IV. Patient is cursing and saying he wants to go home. Dr is at bedside speaking with patient. Patient insists he is leaving.

## 2021-12-29 NOTE — ED Provider Notes (Signed)
[facility] EMERGENCY DEPARTMENT Provider Note   CSN: [id number] Arrival date & time: 12/29/21  1820     History  Chief Complaint  Patient presents with   Fall   Loss of Consciousness   Head Injury    [name] is a 56 y.o. male. With pmh hepatitis C, GERD, history of anoxic brain injury and CVA, DVT on Eliquis BIB EMS from home after losing balance and falling.   According to EMS, because of his history of blood thinners he was brought to the ER.  Unclear if patient had loss of consciousness.  At baseline from previous stroke he has some right arm weakness and left leg numbness.  He had 2 beers around 4 PM today.  Blood glucose 112 with EMS.  Vital signs stable with EMS.  I was unable to obtain good history from patient as he was angry and agitated.  He said he hit his head but he feels fine.  He would like to go. He is denying any numbness, weakness, nausea.   Fall  Loss of Consciousness Head Injury      Home Medications Prior to Admission medications   Medication Sig Start Date End Date Taking? Authorizing Provider  acetaminophen (TYLENOL) 325 MG tablet Take 2 tablets (650 mg total) by mouth every 6 (six) hours as needed for fever or mild pain. 11/30/21   Azucena Fallen, MD  amLODipine (NORVASC) 5 MG tablet Take 1 tablet (5 mg total) by mouth daily. 11/30/21   Azucena Fallen, MD  apixaban (ELIQUIS) 5 MG TABS tablet Take 1 tablet (5 mg total) by mouth 2 (two) times daily. 11/30/21 01/20/22  Azucena Fallen, MD  calcium carbonate (TUMS - DOSED IN MG ELEMENTAL CALCIUM) 500 MG chewable tablet Chew 1 tablet (200 mg of elemental calcium total) by mouth 2 (two) times daily as needed for indigestion or heartburn. 11/30/21   Azucena Fallen, MD  feeding supplement (ENSURE ENLIVE / ENSURE PLUS) LIQD Take 237 mLs by mouth 2 (two) times daily between meals. Patient not taking: Reported on 12/16/2021 11/30/21   Azucena Fallen, MD   hydrOXYzine (ATARAX) 25 MG tablet Take 1 tablet (25 mg total) by mouth every 6 (six) hours as needed for anxiety. 11/30/21   Azucena Fallen, MD  melatonin 5 MG TABS Take 1 tablet (5 mg total) by mouth at bedtime. 11/30/21   Azucena Fallen, MD  ondansetron (ZOFRAN-ODT) 4 MG disintegrating tablet Take 1 tablet (4 mg total) by mouth every 8 (eight) hours as needed for nausea or vomiting. 11/30/21   Azucena Fallen, MD  pantoprazole (PROTONIX) 40 MG tablet Take 1 tablet (40 mg total) by mouth daily at 12 noon. 11/30/21   Azucena Fallen, MD  QUEtiapine (SEROQUEL) 100 MG tablet Take 1 tablet (100 mg total) by mouth at bedtime. 11/30/21   Azucena Fallen, MD  QUEtiapine (SEROQUEL) 25 MG tablet Take 1 tablet (25 mg total) by mouth 2 (two) times daily at 10 AM and 5 PM. 11/30/21   Azucena Fallen, MD  thiamine (VITAMIN B1) 100 MG tablet Take 1 tablet (100 mg total) by mouth daily. 11/30/21   Azucena Fallen, MD      Allergies    Patient has no known allergies.    Review of Systems   Review of Systems  Cardiovascular:  Positive for syncope.    Physical Exam Updated Vital Signs BP 126/88 (BP Location: Right Arm)   Pulse  92   Temp 98 F (36.7 C) (Oral)   Resp 17   Ht 5\' 10"  (1.778 m)   Wt 72.6 kg   SpO2 97%   BMI 22.96 kg/m  Physical Exam Nursing note reviewed: agitated, NAD.  HENT:     Head: Normocephalic and atraumatic.  Cardiovascular:     Rate and Rhythm: Normal rate.  Pulmonary:     Effort: Pulmonary effort is normal.  Abdominal:     General: Abdomen is flat.  Neurological:     Mental Status: He is alert and oriented to person, place, and time.     Comments: Ambulatory with steady gait, normal speech and language, EOMI  Psychiatric:     Comments: agitated     ED Results / Procedures / Treatments   Labs (all labs ordered are listed, but only abnormal results are displayed) Labs Reviewed  ETHANOL - Abnormal; Notable for the following  components:      Result Value   Alcohol, Ethyl (B) 154 (*)    All other components within normal limits  CBC - Abnormal; Notable for the following components:   WBC 11.1 (*)    All other components within normal limits  RAPID URINE DRUG SCREEN, HOSP PERFORMED - Abnormal; Notable for the following components:   Cocaine POSITIVE (*)    All other components within normal limits  URINALYSIS, ROUTINE W REFLEX MICROSCOPIC - Abnormal; Notable for the following components:   Color, Urine COLORLESS (*)    Specific Gravity, Urine 1.002 (*)    All other components within normal limits  I-STAT VENOUS BLOOD GAS, ED - Abnormal; Notable for the following components:   pCO2, Ven 36.5 (*)    pO2, Ven 86 (*)    Calcium, Ion 1.06 (*)    All other components within normal limits  PROTIME-INR  APTT  CBG MONITORING, ED    EKG EKG Interpretation  Date/Time:  Tuesday December 29 2021 18:31:53 EST Ventricular Rate:  91 PR Interval:  152 QRS Duration: 93 QT Interval:  395 QTC Calculation: 486 R Axis:   37 Text Interpretation: Sinus rhythm Borderline prolonged QT interval Confirmed by Georgina Snell [phone]) on 12/29/2021 6:52:46 PM  Radiology No results found.  Procedures Procedures   Medications Ordered in ED Medications - No data to display  ED Course/ Medical Decision Making/ A&P Clinical Course as of 12/29/21 2231  Tue Dec 29, 2021  1946 I was called to patient's room because patient is abrasive and angry and would like to go home.  At the time, only had results of EKG which was overall reassuring normal sinus rhythm with no ischemic change and i-STAT VBG which was also reassuring with normal pH 7.42 and no hypercarbia pCO2 36.5.  I advised him we could be missing a head bleed or other injuries that could lead to death, among multiple other issues that could also lead to permanent injury or disability.  He was repeating the risks back to me and clinically sober ambulatory with steady gait  and no focal neurologic deficits.  Girlfriend was at bedside understanding of the risks of him leaving and trying to convince patient to stay.  However, patient decided to leave [place]. [VB]    Clinical Course User Index [VB] Elgie Congo, MD                           Medical Decision Making  [name] is a 56 y.o.  male. With pmh hepatitis C, GERD, history of anoxic brain injury and CVA, DVT on Eliquis BIB EMS from home after losing balance and falling.   Unable to obtain full history or physical due to patient leaving AMA.  However, patient was clinically sober on exam, ambulatory with steady gait and had capacity to make decisions.  I was called to patient's room because patient is abrasive and angry and would like to go home. He was actively ripping out IV.  At the time, only had results of EKG which was overall reassuring normal sinus rhythm with no ischemic change and i-STAT VBG which was also reassuring with normal pH 7.42 and no hypercarbia pCO2 36.5.  I advised him we could be missing a head bleed or other injuries that could lead to death, stroke, among multiple other issues that could also lead to permanent injury or disability.  He was repeating the risks back to me and clinically sober ambulatory with steady gait and no focal neurologic deficits.  Girlfriend was at bedside understanding of the risks of him leaving and trying to convince patient to stay.  However, patient decided to leave [place]. [VB]  Amount and/or Complexity of Data Reviewed Labs: ordered. Radiology: ordered.    Final Clinical Impression(s) / ED Diagnoses Final diagnoses:  Fall, initial encounter    Rx / DC Orders ED Discharge Orders     None         Elgie Congo, MD 12/29/21 2231

## 2021-12-29 NOTE — ED Triage Notes (Signed)
Pt arrived to ED via [facility] from friend's house. Pt reports he was making a sandwich when he lost his balance and fell. Pt reports LOC and that he hit his head. Pt is on blood thinners d/t CVA noted on MRI in sept. Pt also had an anoxic brain injury per notes. Pt reports he "freaked out" because he was told if he hit his head he needed to be seen. Pt then ran across the road to his friend's house because you didn't have a phone. Once at his friend's house, friend called EMS and told EMS that pt was having a stroke. Pt has R arm weakness, slurred speech and L leg numbness that are baseline from previous CVA. Pt endorses drinking 2 beers around 1600 or 1700. VSS w/ EMS CBG 112. 20g L hand

## 2021-12-30 ENCOUNTER — Emergency Department (HOSPITAL_COMMUNITY): Payer: Medicaid Other

## 2021-12-30 ENCOUNTER — Other Ambulatory Visit: Payer: Self-pay

## 2021-12-30 ENCOUNTER — Emergency Department (HOSPITAL_COMMUNITY)
Admission: EM | Admit: 2021-12-30 | Discharge: 2021-12-30 | Disposition: A | Discharge status: Home / Self Care | Payer: Medicaid Other | Attending: Emergency Medicine | Admitting: Emergency Medicine

## 2021-12-30 DIAGNOSIS — R6889 Other general symptoms and signs: Secondary | ICD-10-CM | POA: Diagnosis not present

## 2021-12-30 DIAGNOSIS — R55 Syncope and collapse: Secondary | ICD-10-CM | POA: Diagnosis not present

## 2021-12-30 DIAGNOSIS — R0689 Other abnormalities of breathing: Secondary | ICD-10-CM | POA: Diagnosis not present

## 2021-12-30 DIAGNOSIS — R42 Dizziness and giddiness: Secondary | ICD-10-CM | POA: Diagnosis not present

## 2021-12-30 DIAGNOSIS — F17210 Nicotine dependence, cigarettes, uncomplicated: Secondary | ICD-10-CM | POA: Insufficient documentation

## 2021-12-30 DIAGNOSIS — R000 Tachycardia, unspecified: Secondary | ICD-10-CM | POA: Diagnosis not present

## 2021-12-30 DIAGNOSIS — Z743 Need for continuous supervision: Secondary | ICD-10-CM | POA: Diagnosis not present

## 2021-12-30 LAB — CBC
HCT: 42.8 % (ref 39.0–52.0)
Hemoglobin: 14.3 g/dL (ref 13.0–17.0)
MCH: 31.6 pg (ref 26.0–34.0)
MCHC: 33.4 g/dL (ref 30.0–36.0)
MCV: 94.7 fL (ref 80.0–100.0)
Platelets: 334 10*3/uL (ref 150–400)
RBC: 4.52 MIL/uL (ref 4.22–5.81)
RDW: 14.9 % (ref 11.5–15.5)
WBC: 10.4 10*3/uL (ref 4.0–10.5)
nRBC: 0 % (ref 0.0–0.2)

## 2021-12-30 LAB — COMPREHENSIVE METABOLIC PANEL
ALT: 38 U/L (ref 0–44)
AST: 24 U/L (ref 15–41)
Albumin: 3.9 g/dL (ref 3.5–5.0)
Alkaline Phosphatase: 98 U/L (ref 38–126)
Anion gap: 10 (ref 5–15)
BUN: 17 mg/dL (ref 6–20)
CO2: 24 mmol/L (ref 22–32)
Calcium: 9.2 mg/dL (ref 8.9–10.3)
Chloride: 106 mmol/L (ref 98–111)
Creatinine, Ser: 1 mg/dL (ref 0.61–1.24)
GFR, Estimated: >60 mL/min (ref >60)
Glucose, Bld: 155 mg/dL — ABNORMAL HIGH (ref 70–99)
Potassium: 3.6 mmol/L (ref 3.5–5.1)
Sodium: 140 mmol/L (ref 135–145)
Total Bilirubin: 0.5 mg/dL (ref 0.3–1.2)
Total Protein: 7.3 g/dL (ref 6.5–8.1)

## 2021-12-30 LAB — ETHANOL: Alcohol, Ethyl (B): <10 mg/dL (ref <10)

## 2021-12-30 MED ORDER — SODIUM CHLORIDE 0.9 % IV BOLUS
1000.0000 mL | Freq: Once | INTRAVENOUS | Status: AC
  Administered 2021-12-30: 1000 mL via INTRAVENOUS

## 2021-12-30 NOTE — ED Triage Notes (Addendum)
Pt BIB [facility] from home, c/o having syncopal episodes for the last 5 days. EMS reports pt was seen last night for the same and left AMA due to wait time.   Pt states he is on blood thinners and has hit his head during these episodes.

## 2021-12-30 NOTE — ED Provider Notes (Signed)
AP-EMERGENCY DEPT [facility] Emergency Department Provider Note MRN:  [id number]  Arrival date & time: 12/30/21     Chief Complaint   Loss of Consciousness   History of Present Illness   [name] is a 56 y.o. year-old male with a history of stroke presenting to the ED with chief complaint of loss of consciousness.  Patient explains that he stood up and got lightheaded and fell and passed out this evening.  Unsure if he hit his head.  Thinks he passed out a few more times today.  Has an abrasion to his right arm.  Otherwise denies any pain or injuries from the fall.  Has decreased strength to his right arm which is chronic and unchanged from prior stroke.  Otherwise no new numbness or weakness to the arms or legs, no chest pain or shortness of breath, no abdominal pain.  Review of Systems  A thorough review of systems was obtained and all systems are negative except as noted in the HPI and PMH.   Patient's Health History    Past Medical History:  Diagnosis Date   Acid reflux    Head injury    Hepatitis C    Hiatal hernia    Stroke [facility])     Past Surgical History:  Procedure Laterality Date   ACROMIO-CLAVICULAR JOINT REPAIR Right 06/28/2018   Procedure: ACROMIO-CLAVICULAR JOINT IRRIGATION AND DEBRIDEMENT;  Surgeon: Cammy Copa, MD;  Location: [facility] OR;  Service: Orthopedics;  Laterality: Right;   FACIAL FRACTURE SURGERY     IRRIGATION AND DEBRIDEMENT SHOULDER Right 06/28/2018   Procedure: IRRIGATION AND DEBRIDEMENT SHOULDER;  Surgeon: Cammy Copa, MD;  Location: [facility] OR;  Service: Orthopedics;  Laterality: Right;   KNEE ARTHROSCOPY Left 06/23/2018   Procedure: LEFT KNEE ARTHROSCOPY KNEE I&D.;  Surgeon: Cammy Copa, MD;  Location: [facility] OR;  Service: Orthopedics;  Laterality: Left;    Family History  Problem Relation Age of Onset   Diabetes Mother    Hypertension Mother    Hypertension Father     Social History   Socioeconomic History   Marital  status: Single    Spouse name: Not on file   Number of children: Not on file   Years of education: Not on file   Highest education level: Not on file  Occupational History   Not on file  Tobacco Use   Smoking status: Every Day    Packs/day: 1.00    Years: 20.00    Total pack years: 20.00    Types: Cigarettes   Smokeless tobacco: Never  Substance and Sexual Activity   Alcohol use: Yes    Comment: socially    Drug use: Not Currently    Types: Cocaine    Comment: states its "been a long time"    Sexual activity: Not Currently  Other Topics Concern   Not on file  Social History Narrative   Not on file   Social Determinants of Health   Financial Resource Strain: Not on file  Food Insecurity: Food Insecurity Present (11/16/2021)   Hunger Vital Sign    Worried About Running Out of Food in the Last Year: Often true    Ran Out of Food in the Last Year: Often true  Transportation Needs: Unmet Transportation Needs (11/16/2021)   PRAPARE - Administrator, Civil Service (Medical): Yes    Lack of Transportation (Non-Medical): Yes  Physical Activity: Not on file  Stress: No Stress Concern Present (12/16/2021)   [place]  [employer] - Occupational Stress Questionnaire    Feeling of Stress : Only a little  Social Connections: Not on file  Intimate Partner Violence: Not At Risk (11/16/2021)   Humiliation, Afraid, Rape, and Kick questionnaire    Fear of Current or Ex-Partner: No    Emotionally Abused: No    Physically Abused: No    Sexually Abused: No     Physical Exam   Vitals:   12/30/21 0400 12/30/21 0430  BP: 102/74 (!) 132/91  Pulse: 92 95  Resp: 13 (!) 22  Temp:    SpO2: 98% 97%    CONSTITUTIONAL: Chronically ill-appearing, NAD NEURO/PSYCH:  Alert and oriented x 3, no focal deficits EYES:  eyes equal and reactive ENT/NECK:  no LAD, no JVD CARDIO: Regular rate, well-perfused, normal S1 and S2 PULM:  CTAB no wheezing or rhonchi GI/GU:   non-distended, non-tender MSK/SPINE:  No gross deformities, no edema SKIN:  no rash, atraumatic   *Additional and/or pertinent findings included in MDM below  Diagnostic and Interventional Summary    EKG Interpretation  Date/Time:  Wednesday December 30 2021 01:33:25 EST Ventricular Rate:  98 PR Interval:  143 QRS Duration: 83 QT Interval:  328 QTC Calculation: 419 R Axis:   43 Text Interpretation: Sinus rhythm Borderline T wave abnormalities Confirmed by Kennis Carina [phone]) on 12/30/2021 1:40:00 AM       Labs Reviewed  COMPREHENSIVE METABOLIC PANEL - Abnormal; Notable for the following components:      Result Value   Glucose, Bld 155 (*)    All other components within normal limits  CBC  ETHANOL    CT HEAD WO CONTRAST ( )  Final Result      Medications  sodium chloride 0.9 % bolus 1,000 mL (0 mLs Intravenous Stopped 12/30/21 0332)     Procedures  /  Critical Care Procedures  ED Course and Medical Decision Making  Initial Impression and Ddx Suspect orthostatic hypotension related to dehydration, patient may be under the influence of drugs or alcohol at this time based on clinical exam/custodial.  Vital signs reassuring, no chest pain or shortness of breath to suggest a severe cardiopulmonary cause of the syncope.  No evidence of DVT on exam.  Anticoagulated and so intracranial bleeding is considered given the possible head trauma, will obtain CT head.  Providing fluids and will reassess.  Past medical/surgical history that increases complexity of ED encounter: History of stroke  Interpretation of Diagnostics I personally reviewed the EKG and my interpretation is as follows: Sinus rhythm, no concerning changes from prior  Labs reassuring with no significant blood count or electrolyte disturbance.  CT head is without acute process.  Patient Reassessment and Ultimate Disposition/Management     Patient resting comfortably with normal vital signs on my  reassessment, feels better.  He ambulates without issues, not orthostatic, appropriate for discharge.  Patient management required discussion with the following services or consulting groups:  None  Complexity of Problems Addressed Acute illness or injury that poses threat of life of bodily function  Additional Data Reviewed and Analyzed Further history obtained from: Prior labs/imaging results  Additional Factors Impacting ED Encounter Risk None  Elmer Sow. Pilar Plate, MD [facility] Emergency Medicine [facility] [email or web address]  Final Clinical Impressions(s) / ED Diagnoses     ICD-10-CM   1. Syncope, unspecified syncope type  R55       ED Discharge Orders     None  Discharge Instructions Discussed with and Provided to Patient:     Discharge Instructions      You were evaluated in the Emergency Department and after careful evaluation, we did not find any emergent condition requiring admission or further testing in the hospital.  Your exam/testing today is overall reassuring.  Drink more fluids at home and follow-up with your regular doctors.  Please return to the Emergency Department if you experience any worsening of your condition.   Thank you for allowing [place] to be a part of your care.       Sabas Sous, MD 12/30/21 [phone]

## 2021-12-30 NOTE — ED Notes (Signed)
Patient transported to CT 

## 2021-12-30 NOTE — Discharge Instructions (Signed)
You were evaluated in the Emergency Department and after careful evaluation, we did not find any emergent condition requiring admission or further testing in the hospital.  Your exam/testing today is overall reassuring.  Drink more fluids at home and follow-up with your regular doctors.  Please return to the Emergency Department if you experience any worsening of your condition.   Thank you for allowing [place] to be a part of your care.

## 2022-01-08 DIAGNOSIS — Z419 Encounter for procedure for purposes other than remedying health state, unspecified: Secondary | ICD-10-CM | POA: Diagnosis not present

## 2022-01-18 ENCOUNTER — Other Ambulatory Visit: Payer: Medicaid Other | Admitting: Obstetrics and Gynecology

## 2022-01-18 NOTE — Patient Outreach (Signed)
  Medicaid Managed Care   Unsuccessful Attempt Note   01/18/2022 Name: [name] MRN: [id number] DOB: 06-Jan-1966  Referred by: Patient, No Pcp Per Reason for referral : High Risk Managed Medicaid (Unsuccessful telephone outreach)   An unsuccessful telephone outreach was attempted today. The patient was referred to the case management team for assistance with care management and care coordination.    Follow Up Plan: The Managed Medicaid care management team will reach out to the patient / DPR again over the next 30 business  days. and The  [employer] (DPR) has been provided with contact information for the Managed Medicaid care management team and has been advised to call with any health related questions or concerns.   [name] RN, BSN [place]  [employer], production - Managed Medicaid High Risk [phone]

## 2022-01-18 NOTE — Patient Instructions (Signed)
Hi Mr. Fiumara we missed you today-I hope all is well - as a part of your Medicaid benefit, you are eligible for care management and care coordination services at no cost or copay. I was unable to reach you by phone today but would be happy to help you with your health related needs. Please feel free to call me at [phone].  A member of the Managed Medicaid care management team will reach out to you again over the next 30 business  days.   [name] RN, BSN [place]  [employer], production - Managed Medicaid High Risk [phone].

## 2022-02-02 ENCOUNTER — Ambulatory Visit: Payer: Medicaid Other | Admitting: Internal Medicine

## 2022-02-08 DIAGNOSIS — Z419 Encounter for procedure for purposes other than remedying health state, unspecified: Secondary | ICD-10-CM | POA: Diagnosis not present

## 2022-02-10 ENCOUNTER — Ambulatory Visit (INDEPENDENT_AMBULATORY_CARE_PROVIDER_SITE_OTHER): Payer: Medicaid Other | Admitting: Internal Medicine

## 2022-02-10 ENCOUNTER — Encounter: Payer: Self-pay | Admitting: Internal Medicine

## 2022-02-10 VITALS — BP 142/100 | HR 89 | Resp 16 | Ht 70.0 in | Wt 180.0 lb

## 2022-02-10 DIAGNOSIS — I10 Essential (primary) hypertension: Secondary | ICD-10-CM | POA: Diagnosis not present

## 2022-02-10 DIAGNOSIS — F320 Major depressive disorder, single episode, mild: Secondary | ICD-10-CM

## 2022-02-10 DIAGNOSIS — I69398 Other sequelae of cerebral infarction: Secondary | ICD-10-CM | POA: Diagnosis not present

## 2022-02-10 DIAGNOSIS — Z0001 Encounter for general adult medical examination with abnormal findings: Secondary | ICD-10-CM | POA: Diagnosis not present

## 2022-02-10 DIAGNOSIS — E7800 Pure hypercholesterolemia, unspecified: Secondary | ICD-10-CM

## 2022-02-10 DIAGNOSIS — K219 Gastro-esophageal reflux disease without esophagitis: Secondary | ICD-10-CM

## 2022-02-10 DIAGNOSIS — F0631 Mood disorder due to known physiological condition with depressive features: Secondary | ICD-10-CM

## 2022-02-10 MED ORDER — PANTOPRAZOLE SODIUM 40 MG PO TBEC: 40.0000 mg | DELAYED_RELEASE_TABLET | Freq: Every day | ORAL | 1 refills | Status: DC

## 2022-02-10 MED ORDER — HYDROXYZINE HCL 25 MG PO TABS: 25.0000 mg | ORAL_TABLET | Freq: Three times a day (TID) | ORAL | 0 refills | Status: DC | PRN

## 2022-02-10 MED ORDER — SERTRALINE HCL 50 MG PO TABS: 50.0000 mg | ORAL_TABLET | Freq: Every day | ORAL | 0 refills | Status: DC

## 2022-02-10 MED ORDER — AMLODIPINE BESYLATE 5 MG PO TABS: 5.0000 mg | ORAL_TABLET | Freq: Every day | ORAL | 1 refills | Status: DC

## 2022-02-10 NOTE — Progress Notes (Signed)
HPI:[name] is a 57 y.o. male with history of GERD, polysubstance use , hepatitis C ,HTN and ischemic stroke is here to establish care. He has not had a PCP and is out of all medications started after stroke. He would like refills on medications. He has felt depressed since having his stroke. He has no major deficits since stroke, but reports he was naturally a happy person before his stroke. For the details of today's visit, please refer to the assessment and plan.   Past Medical History:  Diagnosis Date   Acid reflux    Head injury    Hepatitis C    Hiatal hernia    Stroke [facility])     Past Surgical History:  Procedure Laterality Date   ACROMIO-CLAVICULAR JOINT REPAIR Right 06/28/2018   Procedure: ACROMIO-CLAVICULAR JOINT IRRIGATION AND DEBRIDEMENT;  Surgeon: [name], MD;  Location: [place];  Service: Orthopedics;  Laterality: Right;   FACIAL FRACTURE SURGERY     IRRIGATION AND DEBRIDEMENT SHOULDER Right 06/28/2018   Procedure: IRRIGATION AND DEBRIDEMENT SHOULDER;  Surgeon: [name], MD;  Location: [place];  Service: Orthopedics;  Laterality: Right;   KNEE ARTHROSCOPY Left 06/23/2018   Procedure: LEFT KNEE ARTHROSCOPY KNEE I&D.;  Surgeon: [name], MD;  Location: [place];  Service: Orthopedics;  Laterality: Left;    Family History  Problem Relation Age of Onset   Diabetes Mother    Hypertension Mother    Stroke Father    Hypertension Father     Social History   Tobacco Use   Smoking status: Every Day    Packs/day: 1.00    Years: 20.00    Total pack years: 20.00    Types: Cigarettes   Smokeless tobacco: Never  Substance Use Topics   Alcohol use: Yes    Comment: socially    Drug use: Not Currently    Types: Cocaine    Comment: states its "been a long time"     Physical Exam: Vitals:   02/10/22 1306 02/10/22 1349  BP: (!) 144/103 (!) 142/100  Pulse: 89   Resp: 16   SpO2: 96%   Weight: 180 lb (81.6 kg)   Height: 5\' 10"  (1.778  m)      Physical Exam Constitutional:      Appearance: He is well-developed and normal weight.  Eyes:     General: No scleral icterus.    Conjunctiva/sclera: Conjunctivae normal.  Cardiovascular:     Rate and Rhythm: Normal rate and regular rhythm.     Heart sounds: No murmur heard.    No friction rub. No gallop.  Pulmonary:     Effort: Pulmonary effort is normal.     Breath sounds: No wheezing, rhonchi or rales.  Musculoskeletal:     Right lower leg: No edema.     Left lower leg: No edema.  Skin:    General: Skin is warm and dry.      Assessment & Plan:   Primary hypertension Patient's BP today is 144/103 with a goal of <140/80. Patient is out of amlodipine.   Assessment/Plan: Chronic illness with exacerbation -Refill amlodipine 5 mg today   GERD (gastroesophageal reflux disease) Assessment/Plan: Stable chronic illness - Refill Pantoprazole  - Check  Magnesium  Depression due to old stroke Assessment/Plan: Acute uncomplicated illness  - PHQ 9 score is 20  - sertraline (ZOLOFT) 50 MG tablet; Take 1 tablet (50 mg total) by mouth daily.  Dispense: 60 tablet; Refill: 0 - TSH -  Follow up in one month  Encounter for general adult medical examination with abnormal findings - VITAMIN D 25 Hydroxy (Vit-D Deficiency, Fractures) - CBC with Differential/Platelet - Lipid panel - CMP14+EGFR    Clifford Dy, MD

## 2022-02-10 NOTE — Patient Instructions (Signed)
Thank you, [name] for allowing [place] to provide your care today.   I have ordered the following labs for you:   Lab Orders         Lipid panel         VITAMIN D 25 Hydroxy (Vit-D Deficiency, Fractures)         TSH         Magnesium         CMP14+EGFR         CBC with Differential/Platelet      Tests ordered today:  none  Referrals ordered today:   Referral Orders  No referral(s) requested today     I have ordered the following medication/changed the following medications:   Stop the following medications: Medications Discontinued During This Encounter  Medication Reason   hydrOXYzine (ATARAX) 25 MG tablet Reorder   amLODipine (NORVASC) 5 MG tablet Reorder   pantoprazole (PROTONIX) 40 MG tablet Reorder     Start the following medications: Meds ordered this encounter  Medications   sertraline (ZOLOFT) 50 MG tablet    Sig: Take 1 tablet (50 mg total) by mouth daily.    Dispense:  60 tablet    Refill:  0   amLODipine (NORVASC) 5 MG tablet    Sig: Take 1 tablet (5 mg total) by mouth daily.    Dispense:  90 tablet    Refill:  1   hydrOXYzine (ATARAX) 25 MG tablet    Sig: Take 1 tablet (25 mg total) by mouth every 8 (eight) hours as needed for anxiety.    Dispense:  30 tablet    Refill:  0   pantoprazole (PROTONIX) 40 MG tablet    Sig: Take 1 tablet (40 mg total) by mouth daily at 12 noon.    Dispense:  90 tablet    Refill:  1     Follow up:  1 month       Remember: Follow up in one month for blood pressure check. Come by our office one morning to obtain fasting labs.     Tamsen Snider, M.D.

## 2022-02-13 DIAGNOSIS — I69398 Other sequelae of cerebral infarction: Secondary | ICD-10-CM | POA: Insufficient documentation

## 2022-02-13 DIAGNOSIS — Z0001 Encounter for general adult medical examination with abnormal findings: Secondary | ICD-10-CM | POA: Insufficient documentation

## 2022-02-13 DIAGNOSIS — F32A Depression, unspecified: Secondary | ICD-10-CM | POA: Insufficient documentation

## 2022-02-13 DIAGNOSIS — I10 Essential (primary) hypertension: Secondary | ICD-10-CM | POA: Insufficient documentation

## 2022-02-13 DIAGNOSIS — E7800 Pure hypercholesterolemia, unspecified: Secondary | ICD-10-CM | POA: Insufficient documentation

## 2022-02-13 HISTORY — DX: Depression, unspecified: F32.A

## 2022-02-13 NOTE — Assessment & Plan Note (Addendum)
Assessment/Plan: Acute uncomplicated illness  - PHQ 9 score is 20  - sertraline (ZOLOFT) 50 MG tablet; Take 1 tablet (50 mg total) by mouth daily.  Dispense: 60 tablet; Refill: 0 - TSH -Follow up in one month

## 2022-02-13 NOTE — Assessment & Plan Note (Addendum)
Patient's BP today is 144/103 with a goal of <140/80. Patient is out of amlodipine.   Assessment/Plan: Chronic illness with exacerbation -Refill amlodipine 5 mg today

## 2022-02-13 NOTE — Assessment & Plan Note (Signed)
-   VITAMIN D 25 Hydroxy (Vit-D Deficiency, Fractures) - CBC with Differential/Platelet - Lipid panel - CMP14+EGFR

## 2022-02-13 NOTE — Assessment & Plan Note (Addendum)
-  Mild elevated LDL on previous lipid panel.  -Check lipid panel and CMP.

## 2022-02-13 NOTE — Assessment & Plan Note (Addendum)
Assessment/Plan: Stable chronic illness - Refill Pantoprazole  - Check  Magnesium

## 2022-02-15 ENCOUNTER — Ambulatory Visit: Payer: Medicaid Other | Admitting: Family Medicine

## 2022-02-22 ENCOUNTER — Encounter: Payer: Self-pay | Admitting: Obstetrics and Gynecology

## 2022-02-22 ENCOUNTER — Other Ambulatory Visit: Payer: Medicaid Other | Admitting: Obstetrics and Gynecology

## 2022-02-22 NOTE — Patient Instructions (Signed)
Hi [name], thanks for speaking with me this morning, have a nice day and week!  [name] was given information about Medicaid Managed Care team care coordination services as a part of their [facility] Medicaid benefit. [name] verbally consented to engagement with the [facility] Managed Care team.   If you are experiencing a medical emergency, please call 911 or report to your local emergency department or urgent care.   If you have a non-emergency medical problem during routine business hours, please contact your provider's office and ask to speak with a nurse.   For questions related to your [facility] health plan, please call: [phone] or go here:[email or web address]  If you would like to schedule transportation through your [facility] plan, please call the following number at least 2 days in advance of your appointment: [phone].  You can also use the MTM portal or MTM mobile app to manage your rides. For the portal, please go to [email or web address].  Call the [place] at [phone], at any time, 24 hours a day, 7 days a week. If you are in danger or need immediate medical attention call 911.  If you would like help to quit smoking, call 1-800-QUIT-NOW (907)718-8247) OR Espaol: 1-855-Djelo-Ya (2-836-629-4765) o para ms informacin haga clic aqu or Text READY to 200-400 to register via text  [name] - following are the goals we discussed in your visit today:   Goals Addressed    Timeframe:  Long-Range Goal Priority:  High Start Date:     12/16/21                        Expected End Date:  ongoing                     Follow Up Date: 03/19/22   - schedule appointment for flu shot - schedule appointment for vaccines needed due to my age or health - schedule recommended health tests  - schedule and keep appointment for annual check-up    Why is this important?   Screening tests can find diseases early when they are  easier to treat.  Your doctor or nurse will talk with you about which tests are important for you.  Getting shots for common diseases like the flu and shingles will help prevent them.   02/22/22:  patient seen and evaluated by PCP 02/10/22-has follow up appt 03/17/22  Patient verbalizes understanding of instructions and care plan provided today and agrees to view in [place]. Active MyChart status and patient understanding of how to access instructions and care plan via MyChart confirmed with patient.     The Managed Medicaid care management team will reach out to the patient again over the next 30 business  days.  The  Patient has been provided with contact information for the Managed Medicaid care management team and has been advised to call with any health related questions or concerns.   Clifford Raider RN, BSN [place] Management Coordinator - Managed Medicaid High Risk [phone]   Following is a copy of your plan of care:  Care Plan : [place] of Care  Updates made by Gayla Medicus, RN since 02/22/2022 12:00 AM     Problem: Health Promotion or Disease Self-Management (General Plan of Care)      Long-Range Goal: Chronic Disease Management   Start Date: 12/16/2021  Expected End Date: 05/24/2022  Priority: High  Note:   Current Barriers:  Knowledge Deficits related to plan of care for management of recent ischemic stroke, arthritis, anxiety/depression, tobacco use, GERD, HTN, h/o DVT Chronic Disease Management support and education needs related to recent ischemic stroke, arthritis, anxiety/depression, tobacco use, GERD, HTN, h/o DVT 02/22/22:  patient seen and evaluated by PCP 1/2, has follow up 03/17/22:  BP elevated at PCP appt and started on medication.  Patient checking BP occasionally, last reading systolic 532, does not recall diastolic... Patient has decreased cigarette smoking from 1ppd-1/2 ppd.  Patient started on Zoloft for depression,  patient unable to take as he felt like heart was racing when he took it-to follow up with provider.  Residual effects from stroke remain-right arm weakness, left leg numbness, memory loss-to follow up with provider.  RNCM Clinical Goal(s):  Patient will verbalize understanding of plan for management of ischemic stroke  as evidenced by patient report and follow up  through collaboration with RN Care manager, provider, and care team.   Interventions: Inter-disciplinary care team collaboration (see longitudinal plan of care) Evaluation of current treatment plan related to  self management and patient's adherence to plan as established by provider  Hypertension Interventions:  (Status:  New goal.) Long Term Goal Last practice recorded BP readings:  BP Readings from Last 3 Encounters:  02/10/22 (!) 142/100  12/30/21 (!) 132/91  12/29/21 126/88   Most recent eGFR/CrCl: No results found for: "EGFR"  No components found for: "CRCL"  Evaluation of current treatment plan related to hypertension self management and patient's adherence to plan as established by provider Reviewed medications with patient and discussed importance of compliance Discussed plans with patient for ongoing care management follow up and provided patient with direct contact information for care management team Reviewed scheduled/upcoming provider appointments including:  Assessed social determinant of health barriers   Stroke:  (Status:New goal.) Long Term Goal Reviewed Importance of taking all medications as prescribed Reviewed Importance of attending all scheduled provider appointments Advised to report any changes in symptoms or exercise tolerance Assessed social determinant of health barriers Assessed for signs and symptoms of stroke Assessed for cognitive impairment Assessed use of tobacco use  Patient Goals/Self-Care Activities: Take all medications as prescribed Attend all scheduled provider appointments Call  pharmacy for medication refills 3-7 days in advance of running out of medications Perform all self care activities independently  Perform IADL's (shopping, preparing meals, housekeeping, managing finances) independently Call provider office for new concerns or questions  Patient to follow up with provider regarding Zoloft  Follow Up Plan:  The patient has been provided with contact information for the care management team and has been advised to call with any health related questions or concerns.  The care management team will reach out to the patient again over the next 30 business  days.

## 2022-02-22 NOTE — Patient Outreach (Signed)
Medicaid Managed Care   Nurse Care Manager Note  02/22/2022 Name:  [name] MRN:  [id number] DOB:  1965-05-05  [name] is an 57 y.o. year old male who is a primary patient of Clifford Laroche, [name].  The [facility] Managed Care Coordination team was consulted for assistance with:    Chronic healthcare management needs, HTN, h/o DVT, Hepatitis C, stoke, arthritis, anxiety/depression, tobacco use, GERD, neuropathy  [name] was given information about Medicaid Managed Care Coordination team services today. [name] Patient agreed to services and verbal consent obtained.  Engaged with patient by telephone for follow up visit in response to provider referral for case management and/or care coordination services.   Assessments/Interventions:  Review of past medical history, allergies, medications, health status, including review of consultants reports, laboratory and other test data, was performed as part of comprehensive evaluation and provision of chronic care management services.  SDOH (Social Determinants of Health) assessments and interventions performed: SDOH Interventions    Flowsheet Row Patient Outreach Telephone from 02/22/2022 in [place] POPULATION HEALTH DEPARTMENT Office Visit from 02/10/2022 in [facility] Patient Outreach Telephone from 12/16/2021 in [place] POPULATION HEALTH DEPARTMENT Office Visit from 09/08/2018 in [facility]  SDOH Interventions      Alcohol Usage Interventions -- -- Intervention Not Indicated (Score <7) --  Depression Interventions/Treatment  -- Medication -- Medication  Physical Activity Interventions Intervention Not Indicated  [patient with right leg weakness and some left leg numbeness-residual effects from stroke] -- -- --  Stress Interventions -- -- Intervention Not Indicated --  Social Connections Interventions Intervention Not Indicated -- -- --     Care Plan  No Known  Allergies  Medications Reviewed Today     Reviewed by [name], RN (Registered Nurse) on 02/22/22 at 1044  Med List Status: <None>   Medication Order Taking? Sig Documenting Provider Last Dose Status Informant  amLODipine (NORVASC) 5 MG tablet [id number]  Take 1 tablet (5 mg total) by mouth daily. [name], MD  Active   hydrOXYzine (ATARAX) 25 MG tablet [id number]  Take 1 tablet (25 mg total) by mouth every 8 (eight) hours as needed for anxiety. [name], MD  Active   pantoprazole (PROTONIX) 40 MG tablet [id number]  Take 1 tablet (40 mg total) by mouth daily at 12 noon. [name], MD  Active   sertraline (ZOLOFT) 50 MG tablet [id number] No Take 1 tablet (50 mg total) by mouth daily.  Patient not taking: Reported on 02/22/2022   [name], MD Not Taking Active            Patient Active Problem List   Diagnosis Date Noted   Depression due to old stroke 02/13/2022   Encounter for general adult medical examination with abnormal findings 02/13/2022   Elevated LDL cholesterol level 02/13/2022   Primary hypertension 02/13/2022   Intermittent explosive disorder 11/22/2021   Protein-calorie malnutrition, severe 10/31/2021   Anoxic brain injury (HCC) 10/31/2021   Acute respiratory failure with hypoxia (HCC)    Cerebrovascular accident (CVA) (HCC)    Acute encephalopathy 10/22/2021   Fever    Pleural effusion    Deep venous thrombosis (HCC)    Pain and swelling of left lower leg 12/28/2018   Septic arthritis of shoulder, right (HCC) 09/07/2018   Septic arthritis of sacroiliac joint (HCC) 09/07/2018   Sternoclavicular joint pain, left 09/07/2018   Discitis of lumbosacral region 06/28/2018   Psoas abscess, right (  [place]) 06/28/2018   Polysubstance abuse ([place]) 06/24/2018   Normocytic anemia 06/24/2018   Recurrent boils 06/24/2018   Head injury    Septic arthritis of knee, left (HCC)    Acute medial meniscal tear, left, initial encounter    GERD (gastroesophageal  reflux disease) 06/22/2018   Mild protein-calorie malnutrition ([place]) 06/22/2018   AKI (acute kidney injury) ([place]) 06/21/2018   Spinal stenosis of lumbosacral region 06/21/2018   Chronic hepatitis C ([place]) 12/17/2013   Cigarette smoker 12/17/2013   Family history of diabetes mellitus (DM) 12/17/2013   Notalgia 12/17/2013   Conditions to be addressed/monitored per PCP order:  Chronic healthcare management needs, HTN, h/o DVT, Hepatitis C, stoke, arthritis, anxiety/depression, tobacco use, GERD, neuropathy  Care Plan : RN Care Manager Plan of Care  Updates made by Gayla Medicus, RN since 02/22/2022 12:00 AM     Problem: Health Promotion or Disease Self-Management (General Plan of Care)      Long-Range Goal: Chronic Disease Management   Start Date: 12/16/2021  Expected End Date: 05/24/2022  Priority: High  Note:   Current Barriers:  Knowledge Deficits related to plan of care for management of recent ischemic stroke, arthritis, anxiety/depression, tobacco use, GERD, HTN, h/o DVT Chronic Disease Management support and education needs related to recent ischemic stroke, arthritis, anxiety/depression, tobacco use, GERD, HTN, h/o DVT 02/22/22:  patient seen and evaluated by PCP 1/2, has follow up 03/17/22:  BP elevated at PCP appt and started on medication.  Patient checking BP occasionally, last reading systolic 778, does not recall diastolic... Patient has decreased cigarette smoking from 1ppd-1/2 ppd.  Patient started on Zoloft for depression, patient unable to take as he felt like heart was racing when he took it-to follow up with provider.  Residual effects from stroke remain-right arm weakness, left leg numbness, memory loss-to follow up with provider.  RNCM Clinical Goal(s):  Patient will verbalize understanding of plan for management of ischemic stroke  as evidenced by patient report and follow up  through collaboration with RN Care manager, provider, and care team.    Interventions: Inter-disciplinary care team collaboration (see longitudinal plan of care) Evaluation of current treatment plan related to  self management and patient's adherence to plan as established by provider  Hypertension Interventions:  (Status:  New goal.) Long Term Goal Last practice recorded BP readings:  BP Readings from Last 3 Encounters:  02/10/22 (!) 142/100  12/30/21 (!) 132/91  12/29/21 126/88   Most recent eGFR/CrCl: No results found for: "EGFR"  No components found for: "CRCL"  Evaluation of current treatment plan related to hypertension self management and patient's adherence to plan as established by provider Reviewed medications with patient and discussed importance of compliance Discussed plans with patient for ongoing care management follow up and provided patient with direct contact information for care management team Reviewed scheduled/upcoming provider appointments including:  Assessed social determinant of health barriers   Stroke:  (Status:New goal.) Long Term Goal Reviewed Importance of taking all medications as prescribed Reviewed Importance of attending all scheduled provider appointments Advised to report any changes in symptoms or exercise tolerance Assessed social determinant of health barriers Assessed for signs and symptoms of stroke Assessed for cognitive impairment Assessed use of tobacco use  Patient Goals/Self-Care Activities: Take all medications as prescribed Attend all scheduled provider appointments Call pharmacy for medication refills 3-7 days in advance of running out of medications Perform all self care activities independently  Perform IADL's (shopping, preparing meals, housekeeping, managing finances) independently Call  provider office for new concerns or questions  Patient to follow up with provider regarding Zoloft  Follow Up Plan:  The patient has been provided with contact information for the care management team and has  been advised to call with any health related questions or concerns.  The care management team will reach out to the patient again over the next 30 business  days.    Long-Range Goal: Establish Plan of Care for Chronic Disease Managedment Needs   Priority: High  Note:   Timeframe:  Long-Range Goal Priority:  High Start Date:     12/16/21                        Expected End Date:  ongoing                     Follow Up Date: 03/19/22   - schedule appointment for flu shot - schedule appointment for vaccines needed due to my age or health - schedule recommended health tests  - schedule and keep appointment for annual check-up    Why is this important?   Screening tests can find diseases early when they are easier to treat.  Your doctor or nurse will talk with you about which tests are important for you.  Getting shots for common diseases like the flu and shingles will help prevent them.   02/22/22:  patient seen and evaluated by PCP 02/10/22-has follow up appt 03/17/22   Follow Up:  Patient agrees to Care Plan and Follow-up.  Plan: The Managed Medicaid care management team will reach out to the patient again over the next 30 business  days. and The  Patient has been provided with contact information for the Managed Medicaid care management team and has been advised to call with any health related questions or concerns.  Date/time of next scheduled RN care management/care coordination outreach: 03/19/22 at 315

## 2022-03-11 DIAGNOSIS — Z419 Encounter for procedure for purposes other than remedying health state, unspecified: Secondary | ICD-10-CM | POA: Diagnosis not present

## 2022-03-17 ENCOUNTER — Ambulatory Visit (INDEPENDENT_AMBULATORY_CARE_PROVIDER_SITE_OTHER): Payer: Medicaid Other | Admitting: Family Medicine

## 2022-03-17 ENCOUNTER — Encounter: Payer: Self-pay | Admitting: Family Medicine

## 2022-03-17 VITALS — BP 148/100 | HR 108 | Ht 70.0 in | Wt 189.0 lb

## 2022-03-17 DIAGNOSIS — R1909 Other intra-abdominal and pelvic swelling, mass and lump: Secondary | ICD-10-CM | POA: Insufficient documentation

## 2022-03-17 DIAGNOSIS — N528 Other male erectile dysfunction: Secondary | ICD-10-CM | POA: Diagnosis not present

## 2022-03-17 DIAGNOSIS — Z122 Encounter for screening for malignant neoplasm of respiratory organs: Secondary | ICD-10-CM

## 2022-03-17 DIAGNOSIS — F17210 Nicotine dependence, cigarettes, uncomplicated: Secondary | ICD-10-CM | POA: Diagnosis not present

## 2022-03-17 DIAGNOSIS — Z1211 Encounter for screening for malignant neoplasm of colon: Secondary | ICD-10-CM

## 2022-03-17 DIAGNOSIS — N529 Male erectile dysfunction, unspecified: Secondary | ICD-10-CM | POA: Insufficient documentation

## 2022-03-17 DIAGNOSIS — E785 Hyperlipidemia, unspecified: Secondary | ICD-10-CM

## 2022-03-17 DIAGNOSIS — M7989 Other specified soft tissue disorders: Secondary | ICD-10-CM | POA: Diagnosis not present

## 2022-03-17 DIAGNOSIS — F0631 Mood disorder due to known physiological condition with depressive features: Secondary | ICD-10-CM

## 2022-03-17 DIAGNOSIS — K219 Gastro-esophageal reflux disease without esophagitis: Secondary | ICD-10-CM

## 2022-03-17 DIAGNOSIS — I69398 Other sequelae of cerebral infarction: Secondary | ICD-10-CM | POA: Diagnosis not present

## 2022-03-17 DIAGNOSIS — I639 Cerebral infarction, unspecified: Secondary | ICD-10-CM

## 2022-03-17 DIAGNOSIS — Z720 Tobacco use: Secondary | ICD-10-CM

## 2022-03-17 DIAGNOSIS — I6930 Unspecified sequelae of cerebral infarction: Secondary | ICD-10-CM | POA: Diagnosis not present

## 2022-03-17 DIAGNOSIS — I10 Essential (primary) hypertension: Secondary | ICD-10-CM

## 2022-03-17 MED ORDER — TELMISARTAN 20 MG PO TABS: 20.0000 mg | ORAL_TABLET | Freq: Every day | ORAL | 1 refills | Status: DC

## 2022-03-17 NOTE — Assessment & Plan Note (Signed)
The patient would like to start treatment for erectile dysfunction Inform patient that treatment for erectile dysfunction will be started once his blood pressure is well-controlled, as uncontrolled blood pressure can lead to erectile dysfunction Smoking cessation encouraged Patient verbalized understanding

## 2022-03-17 NOTE — Patient Instructions (Addendum)
I appreciate the opportunity to provide care to you today!    Follow up:  1 months  Labs: please stop by the lab today to get your blood drawn (CBC, CMP, TSH, Lipid profile, HgA1c, Vit D)  Screening: HIV and Hep C  Please try to make an appointment to have your eyes examined at [facility] Address: [address] [place], [place] [address] phone number: [phone]    Please continue to a heart-healthy diet and increase your physical activities. Try to exercise for 34mins at least five times a week.   Physical activity helps: Lower your blood glucose, improve your heart health, lower your blood pressure and cholesterol, burn calories to help manage her weight, gave you energy, lower stress, and improve his sleep.  The American diabetes Association (ADA) recommends being active for 2-1/2 hours (150 minutes) or more week.  Exercise for 30 minutes, 5 days a week (150 minutes total)     It was a pleasure to see you and I look forward to continuing to work together on your health and well-being. Please do not hesitate to call the office if you need care or have questions about your care.   Have a wonderful day and week. With Gratitude, Alvira Monday MSN, [name]-BC

## 2022-03-17 NOTE — Assessment & Plan Note (Signed)
Smokes about 1/2 pack/day  Asked about quitting: confirms that he currently smokes cigarettes Advise to quit smoking: Educated about QUITTING to reduce the risk of cancer, cardio and cerebrovascular disease. Assess willingness: Unwilling to quit at this time, but is working on cutting back. Assist with counseling and pharmacotherapy: Counseled for 5 minutes and literature provided. Arrange for follow up: follow up in 3 months and continue to offer help.  

## 2022-03-17 NOTE — Assessment & Plan Note (Signed)
History of CVA in October 2023 Reports decreased visual acuity since his stroke Patient vision was assessed using a snellen chart Right and left vision is 20/50; both vision is 20/70 Declines referral to ophthalmology in [facility] the patient to follow up with [facility]

## 2022-03-17 NOTE — Progress Notes (Signed)
Established Patient Office Visit  Subjective:  Patient ID: [name], male    DOB: 03-23-1965  Age: 57 y.o. MRN: [id number]  CC:  Chief Complaint  Patient presents with   Follow-up    Following up from visit with Dr. Court Joy, forgot to take medication this am.  Forgot to do labs that [facility] ordered.  Pt would like to discuss viagra.    HPI [name] is a 57 y.o. male with past medical history of primary hypertension, pleural effusion, chronic hepatitis C, DVT thrombosis, and cerebrovascular accident in October 2023 presents for f/u of  chronic medical conditions. For the details of today's visit, please refer to the assessment and plan.     Past Medical History:  Diagnosis Date   Acid reflux    Head injury    Hepatitis C    Hiatal hernia    Stroke [facility])     Past Surgical History:  Procedure Laterality Date   ACROMIO-CLAVICULAR JOINT REPAIR Right 06/28/2018   Procedure: ACROMIO-CLAVICULAR JOINT IRRIGATION AND DEBRIDEMENT;  Surgeon: [name], MD;  Location: [place];  Service: Orthopedics;  Laterality: Right;   FACIAL FRACTURE SURGERY     IRRIGATION AND DEBRIDEMENT SHOULDER Right 06/28/2018   Procedure: IRRIGATION AND DEBRIDEMENT SHOULDER;  Surgeon: [name], MD;  Location: [place];  Service: Orthopedics;  Laterality: Right;   KNEE ARTHROSCOPY Left 06/23/2018   Procedure: LEFT KNEE ARTHROSCOPY KNEE I&D.;  Surgeon: [name], MD;  Location: [place];  Service: Orthopedics;  Laterality: Left;    Family History  Problem Relation Age of Onset   Diabetes Mother    Hypertension Mother    Stroke Father    Hypertension Father     Social History   Socioeconomic History   Marital status: Single    Spouse name: Not on file   Number of children: Not on file   Years of education: Not on file   Highest education level: Not on file  Occupational History   Not on file  Tobacco Use   Smoking status: Every Day    Packs/day: 1.00    Years: 20.00     Total pack years: 20.00    Types: Cigarettes   Smokeless tobacco: Never  Substance and Sexual Activity   Alcohol use: Yes    Comment: socially    Drug use: Not Currently    Types: Cocaine    Comment: states its "been a long time"    Sexual activity: Not Currently  Other Topics Concern   Not on file  Social History Narrative   Not on file   Social Determinants of Health   Financial Resource Strain: Not on file  Food Insecurity: Food Insecurity Present (11/16/2021)   Hunger Vital Sign    Worried About Running Out of Food in the Last Year: Often true    Ran Out of Food in the Last Year: Often true  Transportation Needs: Unmet Transportation Needs (11/16/2021)   PRAPARE - Hydrologist (Medical): Yes    Lack of Transportation (Non-Medical): Yes  Physical Activity: Insufficiently Active (02/22/2022)   Exercise Vital Sign    Days of Exercise per Week: 7 days    Minutes of Exercise per Session: 10 min  Stress: No Stress Concern Present (12/16/2021)   [place]    Feeling of Stress : Only a little  Social Connections: Moderately Isolated (02/22/2022)   Social Connection and Isolation  Panel [NHANES]    Frequency of Communication with Friends and Family: More than three times a week    Frequency of Social Gatherings with Friends and Family: More than three times a week    Attends Religious Services: Never    Marine scientist or Organizations: No    Attends Archivist Meetings: Never    Marital Status: Living with partner  Intimate Partner Violence: Not At Risk (11/16/2021)   Humiliation, Afraid, Rape, and Kick questionnaire    Fear of Current or Ex-Partner: No    Emotionally Abused: No    Physically Abused: No    Sexually Abused: No    Outpatient Medications Prior to Visit  Medication Sig Dispense Refill   amLODipine (NORVASC) 5 MG tablet Take 1 tablet (5 mg total) by mouth  daily. 90 tablet 1   hydrOXYzine (ATARAX) 25 MG tablet Take 1 tablet (25 mg total) by mouth every 8 (eight) hours as needed for anxiety. 30 tablet 0   pantoprazole (PROTONIX) 40 MG tablet Take 1 tablet (40 mg total) by mouth daily at 12 noon. 90 tablet 1   sertraline (ZOLOFT) 50 MG tablet Take 1 tablet (50 mg total) by mouth daily. 60 tablet 0   No facility-administered medications prior to visit.    No Known Allergies  ROS Review of Systems  Constitutional:  Negative for fatigue and fever.  Eyes:  Negative for visual disturbance.  Respiratory:  Negative for chest tightness and shortness of breath.   Cardiovascular:  Negative for chest pain and palpitations.  Neurological:  Negative for dizziness and headaches.      Objective:    Physical Exam HENT:     Head: Normocephalic.     Right Ear: External ear normal.     Left Ear: External ear normal.     Nose: No congestion or rhinorrhea.     Mouth/Throat:     Mouth: Mucous membranes are moist.  Cardiovascular:     Rate and Rhythm: Regular rhythm.     Heart sounds: No murmur heard. Pulmonary:     Effort: No respiratory distress.     Breath sounds: Normal breath sounds.  Abdominal:     Hernia: A hernia is present. Hernia is present in the right inguinal area.  Genitourinary:    Comments: Visible bulge at  the inguinal region with a positive COVID test Neurological:     Mental Status: He is alert.     BP (!) 148/100 (BP Location: Left Arm)   Pulse (!) 108   Ht 5\' 10"  (1.778 m)   Wt 189 lb 0.6 oz (85.7 kg)   SpO2 97%   BMI 27.12 kg/m  Wt Readings from Last 3 Encounters:  03/17/22 189 lb 0.6 oz (85.7 kg)  02/10/22 180 lb (81.6 kg)  12/30/21 160 lb 0.9 oz (72.6 kg)    Lab Results  Component Value Date   TSH 2.234 10/22/2021   Lab Results  Component Value Date   WBC 10.4 12/30/2021   HGB 14.3 12/30/2021   HCT 42.8 12/30/2021   MCV 94.7 12/30/2021   PLT 334 12/30/2021   Lab Results  Component Value Date   NA  140 12/30/2021   K 3.6 12/30/2021   CO2 24 12/30/2021   GLUCOSE 155 (H) 12/30/2021   BUN 17 12/30/2021   CREATININE 1.00 12/30/2021   BILITOT 0.5 12/30/2021   ALKPHOS 98 12/30/2021   AST 24 12/30/2021   ALT 38 12/30/2021   PROT 7.3  12/30/2021   ALBUMIN 3.9 12/30/2021   CALCIUM 9.2 12/30/2021   ANIONGAP 10 12/30/2021   Lab Results  Component Value Date   CHOL 161 10/22/2021   Lab Results  Component Value Date   HDL 16 (L) 10/22/2021   Lab Results  Component Value Date   LDLCALC 97 10/22/2021   Lab Results  Component Value Date   TRIG 238 (H) 10/22/2021   Lab Results  Component Value Date   CHOLHDL 10.1 10/22/2021   Lab Results  Component Value Date   HGBA1C 5.5 10/22/2021      Assessment & Plan:  Primary hypertension Assessment & Plan: Uncontrolled He takes amlodipine 5 mg daily Reports compliance with treatment regiment Will discontinue amlodipine 5 mg and start patient on telmisartan 20 mg today Educated patient on symptomatic hypotension after starting therapy Encourage smoking cessation, low-sodium diet with increased physical activity Patient verbalized understanding BP Readings from Last 3 Encounters:  03/17/22 (!) 148/100  02/10/22 (!) 142/100  12/30/21 (!) 132/91     Orders: -     Telmisartan; Take 1 tablet (20 mg total) by mouth daily.  Dispense: 30 tablet; Refill: 1  Cerebrovascular accident (CVA), unspecified mechanism ([place]) Assessment & Plan: History of CVA in October 2023 Reports decreased visual acuity since his stroke Patient vision was assessed using a snellen chart Right and left vision is 20/50; both vision is 20/70 Declines referral to ophthalmology in [facility] the patient to follow up with [facility]   Other male erectile dysfunction Assessment & Plan: The patient would like to start treatment for erectile dysfunction Inform patient that treatment for erectile dysfunction will be started once his blood pressure is  well-controlled, as uncontrolled blood pressure can lead to erectile dysfunction Smoking cessation encouraged Patient verbalized understanding   Cigarette smoker Assessment & Plan: Smokes about 1/2 pack/day  Asked about quitting: confirms that he currently smokes cigarettes Advise to quit smoking: Educated about QUITTING to reduce the risk of cancer, cardio and cerebrovascular disease. Assess willingness: Unwilling to quit at this time, but is working on cutting back. Assist with counseling and pharmacotherapy: Counseled for 5 minutes and literature provided. Arrange for follow up: follow up in 3 months and continue to offer help.    Tobacco use -     CT CHEST LUNG CANCER SCREENING LOW DOSE WO CONTRAST  Inguinal bulge Assessment & Plan: Right Inguinal bulge palpated Reports pain and heaviness at the affected site Symptom likely of inguinal hernia Will get imaging studies to confirm Inform patient that treatment of inguinal hernia is surgery Patient would like to proceed with surgical removal of his hernia if warranted   Palpable mass of soft tissue of pelvis -     US PELVIS (TRANSABDOMINAL ONLY)  Gastroesophageal reflux disease, unspecified whether esophagitis present  Hyperlipidemia, unspecified hyperlipidemia type  Depression due to old stroke  Colon cancer screening -     Ambulatory referral to Gastroenterology  Screening for lung cancer -     CT CHEST LUNG CANCER SCREENING LOW DOSE WO CONTRAST    Follow-up: Return in about 1 month (around 04/15/2022).   Alvira Monday, [name]

## 2022-03-17 NOTE — Assessment & Plan Note (Signed)
Right Inguinal bulge palpated Reports pain and heaviness at the affected site Symptom likely of inguinal hernia Will get imaging studies to confirm Inform patient that treatment of inguinal hernia is surgery Patient would like to proceed with surgical removal of his hernia if warranted

## 2022-03-17 NOTE — Assessment & Plan Note (Signed)
Uncontrolled He takes amlodipine 5 mg daily Reports compliance with treatment regiment Will discontinue amlodipine 5 mg and start patient on telmisartan 20 mg today Educated patient on symptomatic hypotension after starting therapy Encourage smoking cessation, low-sodium diet with increased physical activity Patient verbalized understanding BP Readings from Last 3 Encounters:  03/17/22 (!) 148/100  02/10/22 (!) 142/100  12/30/21 (!) 132/91

## 2022-03-19 ENCOUNTER — Encounter: Payer: Self-pay | Admitting: *Deleted

## 2022-03-19 ENCOUNTER — Other Ambulatory Visit: Payer: Medicaid Other | Admitting: Obstetrics and Gynecology

## 2022-03-19 ENCOUNTER — Encounter: Payer: Self-pay | Admitting: Obstetrics and Gynecology

## 2022-03-19 ENCOUNTER — Other Ambulatory Visit: Payer: Self-pay | Admitting: Family Medicine

## 2022-03-19 DIAGNOSIS — I10 Essential (primary) hypertension: Secondary | ICD-10-CM

## 2022-03-19 MED ORDER — OLMESARTAN MEDOXOMIL 20 MG PO TABS: 20.0000 mg | ORAL_TABLET | Freq: Every day | ORAL | 1 refills | Status: DC

## 2022-03-19 NOTE — Patient Outreach (Signed)
Care Coordination  03/19/2022  [place] Pati [birth year] [id number]  RNCM called patient at scheduled time.  Patient answered and asked to be called another time. RNCM scheduled another appointment at patient request.  Aida Raider RN, BSN [place] Management Coordinator - Managed [place] High Risk [phone]

## 2022-03-26 ENCOUNTER — Ambulatory Visit (HOSPITAL_COMMUNITY)
Admission: RE | Admit: 2022-03-26 | Discharge: 2022-03-26 | Disposition: A | Discharge status: Home / Self Care | Payer: Medicaid Other | Source: Ambulatory Visit | Attending: Family Medicine | Admitting: Family Medicine

## 2022-03-26 DIAGNOSIS — K449 Diaphragmatic hernia without obstruction or gangrene: Secondary | ICD-10-CM | POA: Diagnosis not present

## 2022-03-26 DIAGNOSIS — M7989 Other specified soft tissue disorders: Secondary | ICD-10-CM | POA: Diagnosis not present

## 2022-03-31 ENCOUNTER — Other Ambulatory Visit: Payer: Self-pay | Admitting: Family Medicine

## 2022-03-31 DIAGNOSIS — R1909 Other intra-abdominal and pelvic swelling, mass and lump: Secondary | ICD-10-CM

## 2022-04-05 ENCOUNTER — Telehealth: Payer: Self-pay | Admitting: Family Medicine

## 2022-04-05 NOTE — Telephone Encounter (Signed)
Spoke to pts girlfriend who is on DPR, let her know there is a CT scan scheduled on 04/15/22 to f/u from the ultrasound.

## 2022-04-05 NOTE — Telephone Encounter (Signed)
Pt states that they were called & told he was supposed to have surgery & no one knows nothing about this. He had a [place] on 03/26/22. Can you please look into this?

## 2022-04-09 DIAGNOSIS — Z419 Encounter for procedure for purposes other than remedying health state, unspecified: Secondary | ICD-10-CM | POA: Diagnosis not present

## 2022-04-13 ENCOUNTER — Telehealth: Payer: Self-pay | Admitting: Family Medicine

## 2022-04-13 NOTE — Telephone Encounter (Signed)
Melissa called from pre services of [place] [phone]. ext [phone] needs prior authorization for CT scan that is scheduled for this week.

## 2022-04-15 ENCOUNTER — Ambulatory Visit (HOSPITAL_BASED_OUTPATIENT_CLINIC_OR_DEPARTMENT_OTHER): Admission: RE | Admit: 2022-04-15 | Payer: Medicaid Other | Source: Ambulatory Visit

## 2022-04-16 ENCOUNTER — Encounter: Payer: Self-pay | Admitting: Family Medicine

## 2022-04-16 ENCOUNTER — Ambulatory Visit: Payer: Medicaid Other | Admitting: Family Medicine

## 2022-04-20 ENCOUNTER — Other Ambulatory Visit: Payer: Medicaid Other | Admitting: Obstetrics and Gynecology

## 2022-04-20 NOTE — Patient Outreach (Signed)
  Medicaid Managed Care   Unsuccessful Attempt Note   04/20/2022 Name: [name] MRN: [id number] DOB: 24-Aug-1965  Referred by: Alvira Monday, [name] Reason for referral : High Risk Managed Medicaid (Unsuccessful telephone outreach)  An unsuccessful telephone outreach was attempted today. The patient was referred to the case management team for assistance with care management and care coordination.    Follow Up Plan: The Managed Medicaid care management team will reach out to the patient again over the next 30 business  days. and The  Patient has been provided with contact information for the Managed Medicaid care management team and has been advised to call with any health related questions or concerns.   Aida Raider RN, BSN [place]  [facility] Curator - Managed Medicaid High Risk [phone]

## 2022-04-20 NOTE — Patient Instructions (Signed)
Visit Information  Hi Mr. Acrey to have missed you today, I hope you are doing okay - as a part of your Medicaid benefit, you are eligible for care management and care coordination services at no cost or copay. I was unable to reach you by phone today but would be happy to help you with your health related needs. Please feel free to call me at [phone]  A member of the Managed Medicaid care management team will reach out to you again over the next 30 business  days.   Aida Raider RN, BSN [place]  [facility] Curator - Managed Medicaid High Risk [phone]

## 2022-04-21 ENCOUNTER — Encounter: Payer: Self-pay | Admitting: Obstetrics and Gynecology

## 2022-04-21 ENCOUNTER — Other Ambulatory Visit: Payer: Medicaid Other | Admitting: Obstetrics and Gynecology

## 2022-04-21 NOTE — Patient Outreach (Signed)
Care Coordination  04/21/2022  [name] Apr 25, 1965 [id number]  RNCM returned phone call from patient and DPR.  Questions regarding upcoming CT appointments.  Patient's DPR given information regarding CT Chest scheduled  for 3/27 at 1pm at [facility].  Patient's DPR to call [facility] to schedule CT Pelvis.  All questions answered at this time.  Aida Raider RN, BSN [place]  [employer] - Managed Medicaid High Risk [phone]

## 2022-04-27 ENCOUNTER — Ambulatory Visit (INDEPENDENT_AMBULATORY_CARE_PROVIDER_SITE_OTHER): Payer: Medicaid Other | Admitting: Family Medicine

## 2022-04-27 ENCOUNTER — Encounter: Payer: Self-pay | Admitting: Family Medicine

## 2022-04-27 VITALS — BP 140/88 | HR 97 | Ht 70.0 in | Wt 190.0 lb

## 2022-04-27 DIAGNOSIS — I10 Essential (primary) hypertension: Secondary | ICD-10-CM

## 2022-04-27 DIAGNOSIS — K219 Gastro-esophageal reflux disease without esophagitis: Secondary | ICD-10-CM | POA: Diagnosis not present

## 2022-04-27 DIAGNOSIS — F0631 Mood disorder due to known physiological condition with depressive features: Secondary | ICD-10-CM | POA: Diagnosis not present

## 2022-04-27 DIAGNOSIS — I69398 Other sequelae of cerebral infarction: Secondary | ICD-10-CM

## 2022-04-27 MED ORDER — SERTRALINE HCL 50 MG PO TABS: 50.0000 mg | ORAL_TABLET | Freq: Every day | ORAL | 1 refills | Status: DC

## 2022-04-27 MED ORDER — AMLODIPINE BESYLATE 10 MG PO TABS: 10.0000 mg | ORAL_TABLET | Freq: Every day | ORAL | 1 refills | Status: DC

## 2022-04-27 MED ORDER — PANTOPRAZOLE SODIUM 40 MG PO TBEC: 40.0000 mg | DELAYED_RELEASE_TABLET | Freq: Every day | ORAL | 1 refills | Status: DC

## 2022-04-27 NOTE — Patient Instructions (Addendum)
I appreciate the opportunity to provide care to you today!    Follow up:  1 months for BP  Labs: please stop by the lab today to get your blood drawn (BMP)  Congratulations on quitting smoking,I am SO Proud of YOUUUU!  Please pick up your refills at the pharmacy Please start taking amlodipine 10 mg daily I recommend that she continue low-sodium diet with increased physical activity We will follow-up in your BP in a month   Please continue to a heart-healthy diet and increase your physical activities. Try to exercise for 91mins at least five days a week.      It was a pleasure to see you and I look forward to continuing to work together on your health and well-being. Please do not hesitate to call the office if you need care or have questions about your care.   Have a wonderful day and week. With Gratitude, Alvira Monday MSN, [name]-BC

## 2022-04-27 NOTE — Progress Notes (Unsigned)
Established Patient Office Visit  Subjective:  Patient ID: [name], male    DOB: October 27, 1965  Age: 57 y.o. MRN: [id number]  CC:  Chief Complaint  Patient presents with   Follow-up    1 month f/u states bp reading have been high at home. Did not start new regimen prescribed for bp wasn't able to afford medication (olmesartan)     HPI [name] is a 57 y.o. male with past medical history of primary hypertension, chronic hepatitis, and GERD presents for hypertension follow up. For the details of today's visit, please refer to the assessment and plan.     Past Medical History:  Diagnosis Date   Acid reflux    Head injury    Hepatitis C    Hiatal hernia    Stroke [facility])     Past Surgical History:  Procedure Laterality Date   ACROMIO-CLAVICULAR JOINT REPAIR Right 06/28/2018   Procedure: ACROMIO-CLAVICULAR JOINT IRRIGATION AND DEBRIDEMENT;  Surgeon: [name], MD;  Location: [place];  Service: Orthopedics;  Laterality: Right;   FACIAL FRACTURE SURGERY     IRRIGATION AND DEBRIDEMENT SHOULDER Right 06/28/2018   Procedure: IRRIGATION AND DEBRIDEMENT SHOULDER;  Surgeon: [name], MD;  Location: [place];  Service: Orthopedics;  Laterality: Right;   KNEE ARTHROSCOPY Left 06/23/2018   Procedure: LEFT KNEE ARTHROSCOPY KNEE I&D.;  Surgeon: [name], MD;  Location: [place];  Service: Orthopedics;  Laterality: Left;    Family History  Problem Relation Age of Onset   Diabetes Mother    Hypertension Mother    Stroke Father    Hypertension Father     Social History   Socioeconomic History   Marital status: Single    Spouse name: Not on file   Number of children: Not on file   Years of education: Not on file   Highest education level: Not on file  Occupational History   Not on file  Tobacco Use   Smoking status: Every Day    Packs/day: 0.50    Years: 20.00    Additional pack years: 0.00    Total pack years: 10.00    Types: Cigarettes   Smokeless  tobacco: Never   Tobacco comments:    1 cig a day since the last visit.  Substance and Sexual Activity   Alcohol use: Yes    Comment: socially    Drug use: Not Currently    Types: Cocaine    Comment: states its "been a long time"    Sexual activity: Not Currently  Other Topics Concern   Not on file  Social History Narrative   Not on file   Social Determinants of Health   Financial Resource Strain: Low Risk  (03/19/2022)   Overall Financial Resource Strain (CARDIA)    Difficulty of Paying Living Expenses: Not very hard  Food Insecurity: No Food Insecurity (04/21/2022)   Hunger Vital Sign    Worried About Running Out of Food in the Last Year: Never true    Ran Out of Food in the Last Year: Never true  Transportation Needs: Unmet Transportation Needs (11/16/2021)   PRAPARE - Transportation    Lack of Transportation (Medical): Yes    Lack of Transportation (Non-Medical): Yes  Physical Activity: Insufficiently Active (02/22/2022)   Exercise Vital Sign    Days of Exercise per Week: 7 days    Minutes of Exercise per Session: 10 min  Stress: No Stress Concern Present (12/16/2021)   [employer] - Occupational Stress Questionnaire    Feeling of Stress : Only a little  Social Connections: Moderately Isolated (02/22/2022)   Social Connection and Isolation Panel [NHANES]    Frequency of Communication with Friends and Family: More than three times a week    Frequency of Social Gatherings with Friends and Family: More than three times a week    Attends Religious Services: Never    Marine scientist or Organizations: No    Attends Archivist Meetings: Never    Marital Status: Living with partner  Intimate Partner Violence: Not At Risk (03/19/2022)   Humiliation, Afraid, Rape, and Kick questionnaire    Fear of Current or Ex-Partner: No    Emotionally Abused: No    Physically Abused: No    Sexually Abused: No    Outpatient Medications Prior to Visit   Medication Sig Dispense Refill   hydrOXYzine (ATARAX) 25 MG tablet Take 1 tablet (25 mg total) by mouth every 8 (eight) hours as needed for anxiety. 30 tablet 0   amLODipine (NORVASC) 5 MG tablet Take 1 tablet (5 mg total) by mouth daily. 90 tablet 1   pantoprazole (PROTONIX) 40 MG tablet Take 1 tablet (40 mg total) by mouth daily at 12 noon. 90 tablet 1   sertraline (ZOLOFT) 50 MG tablet Take 1 tablet (50 mg total) by mouth daily. 60 tablet 0   olmesartan (BENICAR) 20 MG tablet Take 1 tablet (20 mg total) by mouth daily. (Patient not taking: Reported on 04/27/2022) 30 tablet 1   No facility-administered medications prior to visit.    No Known Allergies  ROS Review of Systems  Constitutional:  Negative for fatigue and fever.  Eyes:  Negative for visual disturbance.  Respiratory:  Negative for chest tightness and shortness of breath.   Cardiovascular:  Negative for chest pain and palpitations.  Neurological:  Negative for dizziness and headaches.      Objective:    Physical Exam HENT:     Head: Normocephalic.     Right Ear: External ear normal.     Left Ear: External ear normal.     Nose: No congestion or rhinorrhea.     Mouth/Throat:     Mouth: Mucous membranes are moist.  Cardiovascular:     Rate and Rhythm: Regular rhythm.     Heart sounds: No murmur heard. Pulmonary:     Effort: No respiratory distress.     Breath sounds: Normal breath sounds.  Neurological:     Mental Status: He is alert.     BP (!) 140/88 (BP Location: Left Arm)   Pulse 97   Ht 5\' 10"  (1.778 m)   Wt 190 lb (86.2 kg)   SpO2 97%   BMI 27.26 kg/m  Wt Readings from Last 3 Encounters:  04/27/22 190 lb (86.2 kg)  03/17/22 189 lb 0.6 oz (85.7 kg)  02/10/22 180 lb (81.6 kg)    Lab Results  Component Value Date   TSH 2.234 10/22/2021   Lab Results  Component Value Date   WBC 10.4 12/30/2021   HGB 14.3 12/30/2021   HCT 42.8 12/30/2021   MCV 94.7 12/30/2021   PLT 334 12/30/2021   Lab  Results  Component Value Date   NA 142 04/27/2022   K 5.0 04/27/2022   CO2 25 04/27/2022   GLUCOSE 104 (H) 04/27/2022   BUN 18 04/27/2022   CREATININE 0.90 04/27/2022   BILITOT 0.5 12/30/2021   ALKPHOS 98 12/30/2021   AST  24 12/30/2021   ALT 38 12/30/2021   PROT 7.3 12/30/2021   ALBUMIN 3.9 12/30/2021   CALCIUM 10.1 04/27/2022   ANIONGAP 10 12/30/2021   EGFR 100 04/27/2022   Lab Results  Component Value Date   CHOL 161 10/22/2021   Lab Results  Component Value Date   HDL 16 (L) 10/22/2021   Lab Results  Component Value Date   LDLCALC 97 10/22/2021   Lab Results  Component Value Date   TRIG 238 (H) 10/22/2021   Lab Results  Component Value Date   CHOLHDL 10.1 10/22/2021   Lab Results  Component Value Date   HGBA1C 5.5 10/22/2021      Assessment & Plan:  Primary hypertension Assessment & Plan: Uncontrolled Patient reports not being able to afford telmisartan 20 mg daily He reports taking amlodipine 5 mg daily with lifestyle modification The patient is asymptomatic today in the clinic Will increase amlodipine to 10 mg daily Low-sodium diet with increased physical activity encouraged We will follow-up on BP in 4 weeks BP Readings from Last 3 Encounters:  04/27/22 (!) 140/88  03/17/22 (!) 148/100  02/10/22 (!) 142/100     Orders: -     amLODIPine Besylate; Take 1 tablet (10 mg total) by mouth daily.  Dispense: 90 tablet; Refill: 1 -     BMP8+EGFR  Depression due to old stroke Assessment & Plan: Refilled sertraline 50 mg daily Denies suicidal thoughts and ideation  Orders: -     Sertraline HCl; Take 1 tablet (50 mg total) by mouth daily.  Dispense: 90 tablet; Refill: 1  Gastroesophageal reflux disease, unspecified whether esophagitis present Assessment & Plan: Refill sent to the pharmacy  Orders: -     Pantoprazole Sodium; Take 1 tablet (40 mg total) by mouth daily at 12 noon.  Dispense: 90 tablet; Refill: 1    Follow-up: Return in about 1  month (around 05/28/2022) for BP.   Alvira Monday, [name]

## 2022-04-28 LAB — BMP8+EGFR
BUN/Creatinine Ratio: 20 (ref 9–20)
BUN: 18 mg/dL (ref 6–24)
CO2: 25 mmol/L (ref 20–29)
Calcium: 10.1 mg/dL (ref 8.7–10.2)
Chloride: 105 mmol/L (ref 96–106)
Creatinine, Ser: 0.9 mg/dL (ref 0.76–1.27)
Glucose: 104 mg/dL — ABNORMAL HIGH (ref 70–99)
Potassium: 5 mmol/L (ref 3.5–5.2)
Sodium: 142 mmol/L (ref 134–144)
eGFR: 100 mL/min/{1.73_m2} (ref >59)

## 2022-04-28 NOTE — Assessment & Plan Note (Signed)
Uncontrolled Patient reports not being able to afford telmisartan 20 mg daily He reports taking amlodipine 5 mg daily with lifestyle modification The patient is asymptomatic today in the clinic Will increase amlodipine to 10 mg daily Low-sodium diet with increased physical activity encouraged We will follow-up on BP in 4 weeks BP Readings from Last 3 Encounters:  04/27/22 (!) 140/88  03/17/22 (!) 148/100  02/10/22 (!) 142/100

## 2022-04-28 NOTE — Assessment & Plan Note (Signed)
Refill sent to the pharmacy 

## 2022-04-28 NOTE — Assessment & Plan Note (Signed)
Refilled sertraline 50 mg daily Denies suicidal thoughts and ideation

## 2022-04-29 ENCOUNTER — Emergency Department (HOSPITAL_COMMUNITY)
Admission: EM | Admit: 2022-04-29 | Discharge: 2022-04-29 | Disposition: A | Discharge status: Home / Self Care | Payer: Medicaid Other | Attending: Emergency Medicine | Admitting: Emergency Medicine

## 2022-04-29 ENCOUNTER — Other Ambulatory Visit: Payer: Self-pay

## 2022-04-29 ENCOUNTER — Emergency Department (HOSPITAL_COMMUNITY): Payer: Medicaid Other

## 2022-04-29 ENCOUNTER — Telehealth: Payer: Self-pay | Admitting: Family Medicine

## 2022-04-29 ENCOUNTER — Encounter (HOSPITAL_COMMUNITY): Payer: Self-pay

## 2022-04-29 DIAGNOSIS — D72829 Elevated white blood cell count, unspecified: Secondary | ICD-10-CM | POA: Diagnosis not present

## 2022-04-29 DIAGNOSIS — I10 Essential (primary) hypertension: Secondary | ICD-10-CM | POA: Insufficient documentation

## 2022-04-29 DIAGNOSIS — Z79899 Other long term (current) drug therapy: Secondary | ICD-10-CM | POA: Diagnosis not present

## 2022-04-29 DIAGNOSIS — K0889 Other specified disorders of teeth and supporting structures: Secondary | ICD-10-CM | POA: Diagnosis not present

## 2022-04-29 DIAGNOSIS — Z8673 Personal history of transient ischemic attack (TIA), and cerebral infarction without residual deficits: Secondary | ICD-10-CM | POA: Insufficient documentation

## 2022-04-29 DIAGNOSIS — R220 Localized swelling, mass and lump, head: Secondary | ICD-10-CM | POA: Diagnosis not present

## 2022-04-29 DIAGNOSIS — F17210 Nicotine dependence, cigarettes, uncomplicated: Secondary | ICD-10-CM | POA: Insufficient documentation

## 2022-04-29 LAB — CBC WITH DIFFERENTIAL/PLATELET
Abs Immature Granulocytes: 0.12 10*3/uL — ABNORMAL HIGH (ref 0.00–0.07)
Basophils Absolute: 0.1 10*3/uL (ref 0.0–0.1)
Basophils Relative: 0 %
Eosinophils Absolute: 0 10*3/uL (ref 0.0–0.5)
Eosinophils Relative: 0 %
HCT: 46.8 % (ref 39.0–52.0)
Hemoglobin: 15.9 g/dL (ref 13.0–17.0)
Immature Granulocytes: 1 %
Lymphocytes Relative: 8 %
Lymphs Abs: 1.3 10*3/uL (ref 0.7–4.0)
MCH: 31.5 pg (ref 26.0–34.0)
MCHC: 34 g/dL (ref 30.0–36.0)
MCV: 92.7 fL (ref 80.0–100.0)
Monocytes Absolute: 1 10*3/uL (ref 0.1–1.0)
Monocytes Relative: 6 %
Neutro Abs: 14 10*3/uL — ABNORMAL HIGH (ref 1.7–7.7)
Neutrophils Relative %: 85 %
Platelets: 346 10*3/uL (ref 150–400)
RBC: 5.05 MIL/uL (ref 4.22–5.81)
RDW: 14.3 % (ref 11.5–15.5)
WBC: 16.5 10*3/uL — ABNORMAL HIGH (ref 4.0–10.5)
nRBC: 0 % (ref 0.0–0.2)

## 2022-04-29 LAB — BASIC METABOLIC PANEL
Anion gap: 10 (ref 5–15)
BUN: 19 mg/dL (ref 6–20)
CO2: 25 mmol/L (ref 22–32)
Calcium: 9.6 mg/dL (ref 8.9–10.3)
Chloride: 102 mmol/L (ref 98–111)
Creatinine, Ser: 1 mg/dL (ref 0.61–1.24)
GFR, Estimated: >60 mL/min (ref >60)
Glucose, Bld: 112 mg/dL — ABNORMAL HIGH (ref 70–99)
Potassium: 4.3 mmol/L (ref 3.5–5.1)
Sodium: 137 mmol/L (ref 135–145)

## 2022-04-29 MED ORDER — IOHEXOL 300 MG/ML  SOLN
75.0000 mL | Freq: Once | INTRAMUSCULAR | Status: AC | PRN
  Administered 2022-04-29: 75 mL via INTRAVENOUS

## 2022-04-29 MED ORDER — SODIUM CHLORIDE 0.9 % IV SOLN
3.0000 g | Freq: Once | INTRAVENOUS | Status: AC
  Administered 2022-04-29: 3 g via INTRAVENOUS
  Filled 2022-04-29: qty 8

## 2022-04-29 MED ORDER — OXYCODONE-ACETAMINOPHEN 5-325 MG PO TABS
1.0000 | ORAL_TABLET | Freq: Once | ORAL | Status: AC
  Administered 2022-04-29: 1 via ORAL
  Filled 2022-04-29: qty 1

## 2022-04-29 MED ORDER — AMOXICILLIN-POT CLAVULANATE 875-125 MG PO TABS: 1.0000 | ORAL_TABLET | Freq: Once | ORAL | Status: DC

## 2022-04-29 MED ORDER — AMOXICILLIN-POT CLAVULANATE 875-125 MG PO TABS: 1.0000 | ORAL_TABLET | Freq: Two times a day (BID) | ORAL | 0 refills | Status: DC

## 2022-04-29 MED ORDER — OXYCODONE HCL 5 MG PO TABS: 5.0000 mg | ORAL_TABLET | Freq: Four times a day (QID) | ORAL | 0 refills | Status: DC | PRN

## 2022-04-29 NOTE — Discharge Instructions (Addendum)
Note the workup today was overall reassuring.  As discussed, recommend follow-up with dental specialist outpatient.  See resources guide attached your discharge papers to call to set up an appointment.  Take antibiotic as directed. Recommend tylenol/motrin for baseline pain with pain medication for break through pain. If you notice you develop fever, can't open mouth, note changes in voice, unable to swallow, difficulty breathing, return to the emergency department for further assessment.  Please do not hesitate to return to emergency department for worrisome signs and symptoms we discussed become apparent.

## 2022-04-29 NOTE — ED Provider Notes (Signed)
[facility] Provider Note   CSN: [id number] Arrival date & time: 04/29/22  1436     History  Chief Complaint  Patient presents with   Dental Pain    [name] is a 57 y.o. male.   Dental Pain   57 year old male presents emergency department with complaints of dental pain/swelling.  Patient reports 3 to 4 weeks of intermittent pain after lower left molar fracture.  States that pain has acutely worsened over the past 3 days.  Has noticed swelling on affected side, pain with swallowing.  States that he has eaten minimally for the past 3 days.  Denies fever, difficulty breathing, changes in voice, difficulty opening mouth.  Past medical history significant for CVA, hepatitis C, AKI, GERD, polysubstance abuse, DVT, hypertension  Home Medications Prior to Admission medications   Medication Sig Start Date End Date Taking? Authorizing Provider  oxyCODONE (ROXICODONE) 5 MG immediate release tablet Take 1 tablet (5 mg total) by mouth every 6 (six) hours as needed for severe pain or breakthrough pain. 04/29/22  Yes Dion Saucier A, PA  amLODipine (NORVASC) 10 MG tablet Take 1 tablet (10 mg total) by mouth daily. 04/27/22   Alvira Monday, [name]  amoxicillin-clavulanate (AUGMENTIN) 875-125 MG tablet Take 1 tablet by mouth every 12 (twelve) hours. 04/29/22   Wilnette Kales, PA  hydrOXYzine (ATARAX) 25 MG tablet Take 1 tablet (25 mg total) by mouth every 8 (eight) hours as needed for anxiety. 02/10/22   Lyndal Pulley, MD  olmesartan (BENICAR) 20 MG tablet Take 1 tablet (20 mg total) by mouth daily. Patient not taking: Reported on 04/27/2022 03/19/22   Alvira Monday, [name]  pantoprazole (PROTONIX) 40 MG tablet Take 1 tablet (40 mg total) by mouth daily at 12 noon. 04/27/22   Alvira Monday, [name]  sertraline (ZOLOFT) 50 MG tablet Take 1 tablet (50 mg total) by mouth daily. 04/27/22   Alvira Monday, [place]      Allergies    Patient has no known  allergies.    Review of Systems   Review of Systems  All other systems reviewed and are negative.   Physical Exam Updated Vital Signs BP (!) 147/102 (BP Location: Right Arm)   Pulse 90   Temp 97.6 F (36.4 C) (Oral)   Resp 14   Ht 5\' 10"  (1.778 m)   Wt 83.9 kg   SpO2 97%   BMI 26.54 kg/m  Physical Exam Vitals and nursing note reviewed.  Constitutional:      General: He is not in acute distress.    Appearance: He is well-developed.  HENT:     Head: Normocephalic and atraumatic.     Nose: Nose normal.     Mouth/Throat:     Comments: Uvula midline and rises symmetrically with phonation.  Tonsils are 1+ bilaterally with no obvious exudate.  No sublingual swelling appreciated.  Tenderness to palpation left submandibular area with mild overlying induration.  No obvious external erythema.  Left lower molar fractured with tenderness to palpation of surrounding gumline.  Patient tolerating oral secretions without difficulty.  No obvious auscultatory stridor. Eyes:     Conjunctiva/sclera: Conjunctivae normal.  Cardiovascular:     Rate and Rhythm: Normal rate and regular rhythm.     Heart sounds: No murmur heard. Pulmonary:     Effort: Pulmonary effort is normal. No respiratory distress.     Breath sounds: Normal breath sounds.  Abdominal:     Palpations: Abdomen is soft.  Tenderness: There is no abdominal tenderness.  Musculoskeletal:        General: No swelling.     Cervical back: Neck supple.  Skin:    General: Skin is warm and dry.     Capillary Refill: Capillary refill takes less than 2 seconds.  Neurological:     Mental Status: He is alert.  Psychiatric:        Mood and Affect: Mood normal.     ED Results / Procedures / Treatments   Labs (all labs ordered are listed, but only abnormal results are displayed) Labs Reviewed  BASIC METABOLIC PANEL - Abnormal; Notable for the following components:      Result Value   Glucose, Bld 112 (*)    All other components  within normal limits  CBC WITH DIFFERENTIAL/PLATELET - Abnormal; Notable for the following components:   WBC 16.5 (*)    Neutro Abs 14.0 (*)    Abs Immature Granulocytes 0.12 (*)    All other components within normal limits    EKG None  Radiology CT Maxillofacial W Contrast  Result Date: 04/29/2022 CLINICAL DATA:  possible ludwig EXAM: CT MAXILLOFACIAL WITH CONTRAST TECHNIQUE: Multidetector CT imaging of the maxillofacial structures was performed with intravenous contrast. Multiplanar CT image reconstructions were also generated. RADIATION DOSE REDUCTION: This exam was performed according to the departmental dose-optimization program which includes automated exposure control, adjustment of the mA and/or kV according to patient size and/or use of iterative reconstruction technique. CONTRAST:  60mL OMNIPAQUE IOHEXOL 300 MG/ML  SOLN COMPARISON:  None Available. FINDINGS: Osseous: No fracture or mandibular dislocation. No destructive process. Multiple carious teeth. Orbits: Negative. No traumatic or inflammatory finding. Sinuses: Largely clear. Soft tissues: Left submandibular space edema. No discrete, drainable fluid collection. The submandibular glands and parotid glands some cells are unremarkable. Limited intracranial: No significant or unexpected finding. IMPRESSION: Left submandibular space edema, potentially cellulitis. No discrete, drainable fluid collection. The submandibular glands and parotid glands some cells are unremarkable. Electronically Signed   By: Margaretha Sheffield M.D.   On: 04/29/2022 18:56    Procedures Procedures    Medications Ordered in ED Medications  Ampicillin-Sulbactam (UNASYN) 3 g in sodium chloride 0.9 % 100 mL IVPB (has no administration in time range)  oxyCODONE-acetaminophen (PERCOCET/ROXICET) 5-325 MG per tablet 1 tablet (has no administration in time range)  oxyCODONE-acetaminophen (PERCOCET/ROXICET) 5-325 MG per tablet 1 tablet (1 tablet Oral Given 04/29/22  1656)  iohexol (OMNIPAQUE) 300 MG/ML solution 75 mL (75 mLs Intravenous Contrast Given 04/29/22 1809)    ED Course/ Medical Decision Making/ A&P Clinical Course as of 04/29/22 1930  Thu Apr 29, 2022  1900 CT Maxillofacial W Contrast [CR]    Clinical Course User Index [CR] Wilnette Kales, PA                             Medical Decision Making Amount and/or Complexity of Data Reviewed Labs: ordered. Radiology: ordered. Decision-making details documented in ED Course.  Risk Prescription drug management.   This patient presents to the ED for concern of dental pain, this involves an extensive number of treatment options, and is a complaint that carries with it a high risk of complications and morbidity.  The differential diagnosis includes caries, periapical abscess, Ludwig angina, cellulitis, peritonsillar abscess   Co morbidities that complicate the patient evaluation  See HPI   Additional history obtained:  Additional history obtained from EMR External records from outside source  obtained and reviewed including hospital records   Lab Tests:  I Ordered, and personally interpreted labs.  The pertinent results include: Leukocytosis of 16.5.  No evidence of anemia.  Platelets within normal range.  No electrolyte abnormalities appreciated.  No renal dysfunction.   Imaging Studies ordered:  I ordered imaging studies including CT maxillofacial I independently visualized and interpreted imaging which showed left submandibular space edema.  No discrete drainable fluid collection. I agree with the radiologist interpretation  Cardiac Monitoring: / EKG:  The patient was maintained on a cardiac monitor.  I personally viewed and interpreted the cardiac monitored which showed an underlying rhythm of: Sinus rhythm   Consultations Obtained:  N/a   Problem List / ED Course / Critical interventions / Medication management  Cellulitis/dental pain I ordered medication  including oxycodone, Unasyn   Reevaluation of the patient after these medicines showed that the patient improved I have reviewed the patients home medicines and have made adjustments as needed   Social Determinants of Health:  Chronic cigarette use.  Denies illicit drug use.   Test / Admission - Considered:  Dental pain/cellulitis Vitals signs significant for hypertension with blood pressure 147/102.  Recommend follow-up with primary care regarding elevation blood pressure.. Otherwise within normal range and stable throughout visit. Laboratory/imaging studies significant for: See above Patient with evidence of left lower dental pain.  Patient tolerating p.o. without difficulty.  No evidence of trismus, changes in phonation.  Mild submandibular swelling with no obvious sublingual swelling.  No indication of airway compromise.  Shared decision-making conversation was held with patient regarding outpatient trial of oral antibiotics with prompt return if symptoms worsen and patient preferred said plan.  Patient given 1 dose of Unasyn while in the emergency department with passage of p.o. challenge.  No acute worsening of symptoms.  Treatment plan discussed at length with patient and he acknowledged understanding was agreeable to said plan. Treatment plan were discussed at length with patient and they knowledge understanding was agreeable to said plan.  Appropriate consultations were made as described in the ED course.  Patient was stable upon admission to the hospital.         Final Clinical Impression(s) / ED Diagnoses Final diagnoses:  Pain, dental    Rx / DC Orders ED Discharge Orders          Ordered         oxyCODONE (ROXICODONE) 5 MG immediate release tablet  Every 6 hours PRN        04/29/22 1911    amoxicillin-clavulanate (AUGMENTIN) 875-125 MG tablet  Every 12 hours        04/29/22 1912              Wilnette Kales, [place] 04/29/22 1930    Hayden Rasmussen,  MD 04/30/22 1524

## 2022-04-29 NOTE — ED Triage Notes (Signed)
Pt reports left lower tooth broke and he has pain and swelling x 3 days.

## 2022-04-29 NOTE — ED Provider Triage Note (Addendum)
Emergency Medicine Provider Triage Evaluation Note  [name] , a 57 y.o. male  was evaluated in triage.  Pt complains of dental pain/swelling.  Patient reports 3 to 4 weeks of intermittent pain after lower left molar fracture.  States that pain has acutely worsened over the past 3 days.  Has noticed swelling on affected side, pain with swallowing.  States that he has  eaten minimally for the past 3 days.  Denies fever, difficulty breathing, changes in voice, difficulty opening mouth.  Review of Systems  Positive: See above Negative:   Physical Exam  BP (!) 134/100 (BP Location: Right Arm)   Pulse 98   Temp 97.6 F (36.4 C)   Resp 18   Ht 5\' 10"  (1.778 m)   Wt 83.9 kg   SpO2 98%   BMI 26.54 kg/m  Gen:   Awake, no distress   Resp:  Normal effort  MSK:   Moves extremities without difficulty  Other:  Mild left sublingual swelling as well as left mandibular induration appreciated.  Left lower molar fractured with tenderness to palpation about gingiva.  Patient tolerating oral secretions without difficulty with no auscultatory stridor.  Medical Decision Making  Medically screening exam initiated at 3:59 PM.  Appropriate orders placed.  [name] was informed that the remainder of the evaluation will be completed by another provider, this initial triage assessment does not replace that evaluation, and the importance of remaining in the ED until their evaluation is complete.      [name], [place] 04/29/22 1931

## 2022-04-29 NOTE — ED Notes (Signed)
ED Provider at bedside. 

## 2022-04-29 NOTE — Telephone Encounter (Signed)
Patient at ER to let provider know did not get his medicine can not afford it and needs to get an eye doctor and dentist for his absence tooth ASAP. Patient call back # [phone] to call after 9:00 am in the morning

## 2022-04-30 ENCOUNTER — Telehealth: Payer: Self-pay

## 2022-04-30 NOTE — Telephone Encounter (Signed)
Spoke to pt states he already has a Pharmacist, community for eye dr, I suggested him to call my eye dr [address].

## 2022-04-30 NOTE — Transitions of Care (Post Inpatient/ED Visit) (Signed)
   04/30/2022  Name: [name] MRN: [id number] DOB: 27-Jun-1965  Today's TOC FU Call Status: Today's TOC FU Call Status:: Successful TOC FU Call Competed TOC FU Call Complete Date: 04/30/22  Transition Care Management Follow-up Telephone Call Date of Discharge: 04/29/22 [facility] Type of Discharge: Emergency Department Reason for ED Visit: Other: (dental pain) How have you been since you were released from the hospital?: Same Any questions or concerns?: No  Items Reviewed: Did you receive and understand the discharge instructions provided?: Yes Medications obtained and verified?: Yes (Medications Reviewed) Any new allergies since your discharge?: No Dietary orders reviewed?: NA Do you have support at home?: Yes People in Home: significant other  Home Care and Equipment/Supplies: [place] Ordered?: NA Any new equipment or medical supplies ordered?: NA  Functional Questionnaire: Do you need assistance with bathing/showering or dressing?: No Do you need assistance with meal preparation?: No Do you need assistance with eating?: No Do you have difficulty maintaining continence: No Do you need assistance with getting out of bed/getting out of a chair/moving?: No Do you have difficulty managing or taking your medications?: No  Follow up appointments reviewed: PCP Follow-up appointment confirmed?: No (Contacted [facility] to schedule appointments) MD Provider Line Number:[phone] Given: No [facility] Follow-up appointment confirmed?: No Do you need transportation to your follow-up appointment?: No Do you understand care options if your condition(s) worsen?: Yes-patient verbalized understanding  [name] did send some community resources for patient to have.  Mickel Fuchs, [name], [place] Managed Medicaid Team  [phone]

## 2022-05-05 ENCOUNTER — Ambulatory Visit (HOSPITAL_COMMUNITY)
Admission: RE | Admit: 2022-05-05 | Discharge: 2022-05-05 | Disposition: A | Discharge status: Home / Self Care | Payer: Medicaid Other | Source: Ambulatory Visit | Attending: Family Medicine | Admitting: Family Medicine

## 2022-05-05 DIAGNOSIS — Z87891 Personal history of nicotine dependence: Secondary | ICD-10-CM | POA: Diagnosis not present

## 2022-05-05 DIAGNOSIS — K4020 Bilateral inguinal hernia, without obstruction or gangrene, not specified as recurrent: Secondary | ICD-10-CM | POA: Diagnosis not present

## 2022-05-05 DIAGNOSIS — R1909 Other intra-abdominal and pelvic swelling, mass and lump: Secondary | ICD-10-CM | POA: Diagnosis not present

## 2022-05-05 DIAGNOSIS — Z122 Encounter for screening for malignant neoplasm of respiratory organs: Secondary | ICD-10-CM | POA: Insufficient documentation

## 2022-05-05 DIAGNOSIS — Z720 Tobacco use: Secondary | ICD-10-CM | POA: Insufficient documentation

## 2022-05-05 MED ORDER — IOHEXOL 300 MG/ML  SOLN
80.0000 mL | Freq: Once | INTRAMUSCULAR | Status: AC | PRN
  Administered 2022-05-05: 80 mL via INTRAVENOUS

## 2022-05-10 ENCOUNTER — Other Ambulatory Visit: Payer: Self-pay | Admitting: Family Medicine

## 2022-05-10 DIAGNOSIS — Z419 Encounter for procedure for purposes other than remedying health state, unspecified: Secondary | ICD-10-CM | POA: Diagnosis not present

## 2022-05-10 DIAGNOSIS — Z122 Encounter for screening for malignant neoplasm of respiratory organs: Secondary | ICD-10-CM

## 2022-05-10 NOTE — Progress Notes (Signed)
   Please inform the patient that his CT scan of the pelvis shows a Bilateral inguinal hernia. I recommend surgery to repair the hernia. Please let me know if the patient wants to proceed with the surgery.

## 2022-05-10 NOTE — Progress Notes (Signed)
Please inform the patient that nodules were seen on his CT scan. A referral was placed to pulmonary for f/u  in 3 months.

## 2022-05-13 ENCOUNTER — Encounter: Payer: Self-pay | Admitting: Obstetrics and Gynecology

## 2022-05-13 ENCOUNTER — Other Ambulatory Visit: Payer: Medicaid Other | Admitting: Obstetrics and Gynecology

## 2022-05-13 NOTE — Patient Instructions (Signed)
Hi [name], thanks for speaking with me this afternoon-have a great rest of the day!  [name] was given information about Medicaid Managed Care team care coordination services as a part of their [place] Medicaid benefit. [name] verbally consentedto engagement with the [facility] Managed Care team.   If you are experiencing a medical emergency, please call 911 or report to your local emergency department or urgent care.   If you have a non-emergency medical problem during routine business hours, please contact your provider's office and ask to speak with a nurse.   For questions related to your [facility] health plan, please call: [phone]  OR visit the member homepage at: [email or web address]  If you would like to schedule transportation through your [facility] plan, please call the following number at least 2 days in advance of your appointment: [phone]  If you are experiencing a behavioral health crisis, call the [place] at [phone]). The line is available 24 hours a day, seven days a week.  If you would like help to quit smoking, call 1-800-QUIT-NOW 701-020-0387) OR Espaol: 1-855-Djelo-Ya HD:1601594) o para ms informacin haga clic aqu or Text READY to 200-400 to register via text  [name] - following are the goals we discussed in your visit today:   Goals Addressed    Timeframe:  Long-Range Goal Priority:  High Start Date:     12/16/21                        Expected End Date:  ongoing                     Follow Up Date: 06/14/22   - schedule appointment for flu shot - schedule appointment for vaccines needed due to my age or health - schedule recommended health tests  - schedule and keep appointment for annual check-up    Why is this important?   Screening tests can find diseases  early when they are easier to treat.  Your doctor or nurse will talk with you about which tests are important for you.  Getting shots for common diseases like the flu and shingles will help prevent them.   05/13/22:  PCP appt 4/23, Dental appt 4/10  Patient verbalizes understanding of instructions and care plan provided today and agrees to view in [place]. Active MyChart status and patient understanding of how to access instructions and care plan via MyChart confirmed with patient.     The Managed Medicaid care management team will reach out to the patient again over the next 30 business  days.  The  Patient  has been provided with contact information for the Managed Medicaid care management team and has been advised to call with any health related questions or concerns.   Aida Raider RN, BSN [place] Management Coordinator - Managed Medicaid High Risk [phone]   Following is a copy of your plan of care:  Care Plan : [place] of Care  Updates made by Gayla Medicus, RN since 05/13/2022 12:00 AM     Problem: Health Promotion or Disease Self-Management (General Plan of Care)      Long-Range Goal: Chronic Disease Management   Start Date: 12/16/2021  Expected End Date: 08/12/2022  Priority: High  Note:   Current Barriers:  Knowledge Deficits related to plan of care for management of recent  ischemic stroke, arthritis, anxiety/depression, tobacco use, GERD, HTN, h/o DVT Chronic Disease Management support and education needs related to recent ischemic stroke, arthritis, anxiety/depression, tobacco use, GERD, HTN, h/o DVT 05/13/22:  BP med adjusted.  Smoking 1/2 ppd.  Zoloft helpful.  Has f/u PCP appt 4/23 and dental appt 4/10.  Patient given information for eye provider-[facility] [place].  RNCM Clinical Goal(s):  Patient will verbalize understanding of plan for management of ischemic stroke  as evidenced by patient report and follow up   through collaboration with RN Care manager, provider, and care team.   Interventions: Inter-disciplinary care team collaboration (see longitudinal plan of care) Evaluation of current treatment plan related to  self management and patient's adherence to plan as established by provider Patient provided with eye provider information.  Hypertension Interventions:  (Status:  New goal.) Long Term Goal Last practice recorded BP readings:  BP Readings from Last 3 Encounters:  02/10/22 (!) 142/100  12/30/21 (!) 132/91  12/29/21 126/88  05/12/22:            137/100 Most recent eGFR/CrCl: No results found for: "EGFR"  No components found for: "CRCL"  Evaluation of current treatment plan related to hypertension self management and patient's adherence to plan as established by provider Reviewed medications with patient and discussed importance of compliance Discussed plans with patient for ongoing care management follow up and provided patient with direct contact information for care management team Reviewed scheduled/upcoming provider appointments including:  Assessed social determinant of health barriers   Stroke:  (Status:New goal.) Long Term Goal Reviewed Importance of taking all medications as prescribed Reviewed Importance of attending all scheduled provider appointments Advised to report any changes in symptoms or exercise tolerance Assessed social determinant of health barriers Assessed for signs and symptoms of stroke Assessed for cognitive impairment Assessed use of tobacco use  Patient Goals/Self-Care Activities: Take all medications as prescribed Attend all scheduled provider appointments Call pharmacy for medication refills 3-7 days in advance of running out of medications Perform all self care activities independently  Perform IADL's (shopping, preparing meals, housekeeping, managing finances) independently Call provider office for new concerns or questions  Patient to follow up  with provider regarding Zoloft Patient's DPR to call and schedule CT Pelvis as ordered.  Follow Up Plan:  The patient has been provided with contact information for the care management team and has been advised to call with any health related questions or concerns.  The care management team will reach out to the patient again over the next 30 business  days.

## 2022-05-13 NOTE — Patient Outreach (Signed)
Medicaid Managed Care   Nurse Care Manager Note  05/13/2022 Name:  [name] MRN:  [id number] DOB:  13-Nov-1965  [name] is an 57 y.o. year old male who is Chan primary patient of [name], [name].  The [facility] Managed Care Coordination team was consulted for assistance with:    Chronic healthcare management needs,  HTN, h/o DVT, chronic Hepatitis, h/o CVA, arthritis, anxiety/depression, tobacco use, GERD, neuropathy, memory loss.  [name] was given information about Medicaid Managed Care Coordination team services today. [name] Patient agreed to services and verbal consent obtained.  Engaged with patient by telephone for follow up visit in response to provider referral for case management and/or care coordination services.   Assessments/Interventions:  Review of past medical history, allergies, medications, health status, including review of consultants reports, laboratory and other test data, was performed as part of comprehensive evaluation and provision of chronic care management services.  SDOH (Social Determinants of Health) assessments and interventions performed: SDOH Interventions    Flowsheet Row Patient Outreach Telephone from 05/13/2022 in [name] Patient Outreach Telephone from 04/21/2022 in [name] Patient Outreach Telephone from 03/19/2022 in [name] Patient Outreach Telephone from 02/22/2022 in [name] Office Visit from 02/10/2022 in [name] Primary Care Patient Outreach Telephone from 12/16/2021 in [name] Interventions -- Intervention Not Indicated -- -- -- --  Housing Interventions -- Intervention Not Indicated -- -- -- --  Transportation Interventions Intervention Not Indicated -- -- -- -- --  Utilities Interventions Intervention Not Indicated  -- -- -- -- --  Alcohol Usage Interventions -- -- -- -- -- Intervention Not Indicated (Score <7)  Depression Interventions/Treatment  -- -- -- -- Medication --  Financial Strain Interventions -- -- Intervention Not Indicated -- -- --  Physical Activity Interventions -- -- -- Intervention Not Indicated  [patient with right leg weakness and some left leg numbeness-residual effects from stroke] -- --  Stress Interventions -- -- -- -- -- Intervention Not Indicated  Social Connections Interventions -- -- -- Intervention Not Indicated -- --     Care Plan  No Known Allergies  Medications Reviewed Today     Reviewed by Clifford Medicus, RN (Registered Nurse) on 05/13/22 at [name] List Status: <None>   Medication Order Taking? Sig Documenting Provider Last Dose Status Informant  amLODipine (NORVASC) 10 MG tablet [id number]  Take 1 tablet (10 mg total) by mouth daily. Clifford Monday, [name]  Active   amoxicillin-clavulanate (AUGMENTIN) 875-125 MG tablet [id number]  Take 1 tablet by mouth every 12 (twelve) hours. Clifford Kales, PA  Active   hydrOXYzine (ATARAX) 25 MG tablet [id number] No Take 1 tablet (25 mg total) by mouth every 8 (eight) hours as needed for anxiety. Clifford Pulley, MD Taking Active   olmesartan (BENICAR) 20 MG tablet [id number] No Take 1 tablet (20 mg total) by mouth daily.  Patient not taking: Reported on 04/27/2022   Clifford Monday, [name] Not Taking Active   oxyCODONE (ROXICODONE) 5 MG immediate release tablet [id number]  Take 1 tablet (5 mg total) by mouth every 6 (six) hours as needed for severe pain or breakthrough pain. [name] A, PA  Active   pantoprazole (PROTONIX) 40 MG tablet [id number]  Take 1 tablet (40 mg total) by mouth daily at 12 noon. Clifford Monday, [name]  Active  sertraline (ZOLOFT) 50 MG tablet [id number]  Take 1 tablet (50 mg total) by mouth daily. Clifford Monday, [name]  Active            Patient Active Problem List   Diagnosis Date Noted   Erectile  dysfunction 03/17/2022   Inguinal bulge 03/17/2022   Depression due to old stroke 02/13/2022   Encounter for general adult medical examination with abnormal findings 02/13/2022   Elevated LDL cholesterol level 02/13/2022   Primary hypertension 02/13/2022   Intermittent explosive disorder 11/22/2021   Protein-calorie malnutrition, severe 10/31/2021   Anoxic brain injury 10/31/2021   Acute respiratory failure with hypoxia    Cerebrovascular accident (CVA)    Acute encephalopathy 10/22/2021   Fever    Pleural effusion    Deep venous thrombosis    Pain and swelling of left lower leg 12/28/2018   Septic arthritis of shoulder, right 09/07/2018   Septic arthritis of sacroiliac joint 09/07/2018   Sternoclavicular joint pain, left 09/07/2018   Discitis of lumbosacral region 06/28/2018   Psoas abscess, right 06/28/2018   Polysubstance abuse 06/24/2018   Normocytic anemia 06/24/2018   Recurrent boils 06/24/2018   Head injury    Septic arthritis of knee, left    Acute medial meniscal tear, left, initial encounter    GERD (gastroesophageal reflux disease) 06/22/2018   Mild protein-calorie malnutrition 06/22/2018   AKI (acute kidney injury) 06/21/2018   Spinal stenosis of lumbosacral region 06/21/2018   Chronic hepatitis C ([place]) 12/17/2013   Cigarette smoker 12/17/2013   Family history of diabetes mellitus (DM) 12/17/2013   Notalgia 12/17/2013   Conditions to be addressed/monitored per PCP order:  Chronic healthcare management needs,   Care Plan : RN Care Manager Plan of Care  Updates made by Clifford Medicus, RN since 05/13/2022 12:00 AM     Problem: Health Promotion or Disease Self-Management (General Plan of Care)      Long-Range Goal: Chronic Disease Management   Start Date: 12/16/2021  Expected End Date: 08/12/2022  Priority: High  Note:   Current Barriers:  Knowledge Deficits related to plan of care for management of recent ischemic stroke, arthritis, anxiety/depression, tobacco  use, GERD, HTN, h/o DVT Chronic Disease Management support and education needs related to recent ischemic stroke, arthritis, anxiety/depression, tobacco use, GERD, HTN, h/o DVT 05/13/22:  BP med adjusted.  Smoking 1/2 ppd.  Zoloft helpful.  Has f/u PCP appt 4/23 and dental appt 4/10.  Patient given information for eye provider-[facility] In [name].  RNCM Clinical Goal(s):  Patient will verbalize understanding of plan for management of ischemic stroke  as evidenced by patient report and follow up  through collaboration with RN Care manager, provider, and care team.   Interventions: Inter-disciplinary care team collaboration (see longitudinal plan of care) Evaluation of current treatment plan related to  self management and patient's adherence to plan as established by provider Patient provided with eye provider information.  Hypertension Interventions:  (Status:  New goal.) Long Term Goal Last practice recorded BP readings:  BP Readings from Last 3 Encounters:  02/10/22 (!) 142/100  12/30/21 (!) 132/91  12/29/21 126/88  05/12/22:            137/100 Most recent eGFR/CrCl: No results found for: "EGFR"  No components found for: "CRCL"  Evaluation of current treatment plan related to hypertension self management and patient's adherence to plan as established by provider Reviewed medications with patient and discussed importance of compliance Discussed plans with patient for ongoing care  management follow up and provided patient with direct contact information for care management team Reviewed scheduled/upcoming provider appointments including:  Assessed social determinant of health barriers   Stroke:  (Status:New goal.) Long Term Goal Reviewed Importance of taking all medications as prescribed Reviewed Importance of attending all scheduled provider appointments Advised to report any changes in symptoms or exercise tolerance Assessed social determinant of health barriers Assessed for signs  and symptoms of stroke Assessed for cognitive impairment Assessed use of tobacco use  Patient Goals/Self-Care Activities: Take all medications as prescribed Attend all scheduled provider appointments Call pharmacy for medication refills 3-7 days in advance of running out of medications Perform all self care activities independently  Perform IADL's (shopping, preparing meals, housekeeping, managing finances) independently Call provider office for new concerns or questions  Patient to follow up with provider regarding Zoloft Patient's DPR to call and schedule CT Pelvis as ordered.  Follow Up Plan:  The patient has been provided with contact information for the care management team and has been advised to call with any health related questions or concerns.  The care management team will reach out to the patient again over the next 30 business  days.    Long-Range Goal: Establish Plan of Care for Chronic Disease Managedment Needs   Priority: High  Note:   Timeframe:  Long-Range Goal Priority:  High Start Date:     12/16/21                        Expected End Date:  ongoing                     Follow Up Date: 06/14/22   - schedule appointment for flu shot - schedule appointment for vaccines needed due to my age or health - schedule recommended health tests  - schedule and keep appointment for annual check-up    Why is this important?   Screening tests can find diseases early when they are easier to treat.  Your doctor or nurse will talk with you about which tests are important for you.  Getting shots for common diseases like the flu and shingles will help prevent them.   05/13/22:  PCP appt 4/23, Dental appt 4/10   Follow Up:  Patient agrees to Care Plan and Follow-up.  Plan: The Managed Medicaid care management team will reach out to the patient again over the next 30 business  days. and The  Patient has been provided with contact information for the Managed Medicaid care management  team and has been advised to call with any health related questions or concerns.  Date/time of next scheduled RN care management/care coordination outreach: 06/14/22 at 145.

## 2022-05-17 ENCOUNTER — Ambulatory Visit: Payer: Medicaid Other | Admitting: Obstetrics and Gynecology

## 2022-05-17 ENCOUNTER — Other Ambulatory Visit: Payer: Self-pay | Admitting: Family Medicine

## 2022-05-17 DIAGNOSIS — K4020 Bilateral inguinal hernia, without obstruction or gangrene, not specified as recurrent: Secondary | ICD-10-CM

## 2022-05-17 NOTE — Progress Notes (Signed)
Please inform the patient that a referral has been placed to general surgery

## 2022-06-01 ENCOUNTER — Encounter: Payer: Medicaid Other | Admitting: Family Medicine

## 2022-06-01 ENCOUNTER — Ambulatory Visit: Payer: Medicaid Other | Admitting: Surgery

## 2022-06-02 NOTE — Progress Notes (Signed)
Erroneous encounter-disregard

## 2022-06-03 ENCOUNTER — Encounter: Payer: Self-pay | Admitting: Family Medicine

## 2022-06-09 DIAGNOSIS — Z419 Encounter for procedure for purposes other than remedying health state, unspecified: Secondary | ICD-10-CM | POA: Diagnosis not present

## 2022-06-14 ENCOUNTER — Other Ambulatory Visit: Payer: Medicaid Other | Admitting: Obstetrics and Gynecology

## 2022-06-14 NOTE — Patient Outreach (Signed)
  Medicaid Managed Care   Unsuccessful Attempt Note   06/14/2022 Name: [name] MRN: [id number] DOB: 1965-12-14  Referred by: [name], [name] Reason for referral : High Risk Managed Medicaid (Unsuccessful telephone outreach)  An unsuccessful telephone outreach was attempted today. The patient was referred to the case management team for assistance with care management and care coordination.    Follow Up Plan: The Managed Medicaid care management team will reach out to the patient again over the next 30 business  days. and The  Patient has been provided with contact information for the Managed Medicaid care management team and has been advised to call with any health related questions or concerns.   [name] RN, BSN [place]  [employer], production - Managed Medicaid High Risk [phone]

## 2022-06-14 NOTE — Patient Instructions (Signed)
Hi Mr. Rudden, I am sorry I missed you today, I hope you are doing okay- as a part of your Medicaid benefit, you are eligible for care management and care coordination services at no cost or copay. I was unable to reach you by phone today but would be happy to help you with your health related needs. Please feel free to call me at [phone].  A member of the Managed Medicaid care management team will reach out to you again over the next 30 business  days.   [name] RN, BSN [place]  [employer] production - Managed Medicaid High Risk [phone]

## 2022-06-15 ENCOUNTER — Other Ambulatory Visit: Payer: Medicaid Other | Admitting: Obstetrics and Gynecology

## 2022-06-15 ENCOUNTER — Encounter: Payer: Self-pay | Admitting: Obstetrics and Gynecology

## 2022-06-15 NOTE — Patient Instructions (Signed)
Hi [name] for speaking with me-be sure and call PCP office for an appointment and refills.  Also,  reschedule dental appt.  [name] was given information about Medicaid Managed Care team care coordination services as a part of their [facility] Medicaid benefit. [name] verbally consented to engagement with the [facility] Managed Care team.   If you are experiencing a medical emergency, please call 911 or report to your local emergency department or urgent care.   If you have a non-emergency medical problem during routine business hours, please contact your provider's office and ask to speak with a nurse.   For questions related to your [facility] health plan, please call: [phone] or go here:[email or web address]  If you would like to schedule transportation through your [facility] plan, please call the following number at least 2 days in advance of your appointment: [phone].   You can also use the MTM portal or MTM mobile app to manage your rides. Reimbursement for transportation is available through [facility]! For the portal, please go to [email or web address]  Call the [facility] Crisis Line at [phone], at any time, 24 hours a day, 7 days a week. If you are in danger or need immediate medical attention call 911.  If you would like help to quit smoking, call 1-800-QUIT-NOW ((346)131-7486) OR Espaol: 1-855-Djelo-Ya (2-725-366-4403) o para ms informacin haga clic aqu or Text READY to 474-259 to register via text  [name] - following are the goals we discussed in your visit today:   Goals Addressed   None   Timeframe:  Long-Range Goal Priority:  High Start Date:     12/16/21                        Expected End Date:  ongoing                     Follow Up Date: 07/21/22   - schedule appointment for flu shot - schedule appointment for vaccines needed due to my age or health - schedule recommended health tests  - schedule and  keep appointment for annual check-up    Why is this important?   Screening tests can find diseases early when they are easier to treat.  Your doctor or nurse will talk with you about which tests are important for you.  Getting shots for common diseases like the flu and shingles will help prevent them.   06/15/22:  patient to reschedule dental appt and schedule PCP appt  Patient verbalizes understanding of instructions and care plan provided today and agrees to view in MyChart. Active MyChart status and patient understanding of how to access instructions and care plan via MyChart confirmed with patient.     The Managed Medicaid care management team will reach out to the patient again over the next 30 business  days.  The  Patient  has been provided with contact information for the Managed Medicaid care management team and has been advised to call with any health related questions or concerns.   [name], BSN [place]  [facility] Care Management Coordinator - Managed Medicaid High Risk [phone]   Following is a copy of your plan of care:  Care Plan : Chan Care Manager Plan of Care  Updates made by Clifford Chandler, Chan since 06/15/2022 12:00 AM     Problem: Health Promotion or Disease Self-Management (General Plan of Care)      Long-Range Goal: Chronic  Disease Management   Start Date: 12/16/2021  Expected End Date: 08/12/2022  Priority: High  Note:   Current Barriers:  Knowledge Deficits related to plan of care for management of recent ischemic stroke, arthritis, anxiety/depression, tobacco use, GERD, HTN, h/o DVT Chronic Disease Management support and education needs related to recent ischemic stroke, arthritis, anxiety/depression, tobacco use, GERD, HTN, h/o DVT 06/15/22:  Patient to reschedule dental appt, to schedule PCP appt for BP f/u.  Patient states Zoloft not helpful-to f/u with PCP, also needs Protonix refill.  Patient states BP WNL at home, no exact readings  available today.  Smokes 1/2 ppd.  RNCM Clinical Goal(s):  Patient will verbalize understanding of plan for management of ischemic stroke  as evidenced by patient report and follow up  through collaboration with Chan Care manager, provider, and care team.   Interventions: Inter-disciplinary care team collaboration (see longitudinal plan of care) Evaluation of current treatment plan related to  self management and patient's adherence to plan as established by provider Patient provided with eye provider information.  Hypertension Interventions:  (Status:  New goal.) Long Term Goal Last practice recorded BP readings:  BP Readings from Last 3 Encounters:  02/10/22 (!) 142/100  12/30/21 (!) 132/91  12/29/21 126/88  05/12/22:            137/100  Most recent eGFR/CrCl: No results found for: "EGFR"  No components found for: "CRCL"  Evaluation of current treatment plan related to hypertension self management and patient's adherence to plan as established by provider Reviewed medications with patient and discussed importance of compliance Discussed plans with patient for ongoing care management follow up and provided patient with direct contact information for care management team Reviewed scheduled/upcoming provider appointments including:  Assessed social determinant of health barriers   Stroke:  (Status:New goal.) Long Term Goal Reviewed Importance of taking all medications as prescribed Reviewed Importance of attending all scheduled provider appointments Advised to report any changes in symptoms or exercise tolerance Assessed social determinant of health barriers Assessed for signs and symptoms of stroke Assessed for cognitive impairment Assessed use of tobacco use  Patient Goals/Self-Care Activities: Take all medications as prescribed Attend all scheduled provider appointments Call pharmacy for medication refills 3-7 days in advance of running out of medications Perform all self care  activities independently  Perform IADL's (shopping, preparing meals, housekeeping, managing finances) independently Call provider office for new concerns or questions  Patient to follow up with provider regarding Zoloft Patient's DPR to call and schedule CT Pelvis as ordered. Patient to reschedule dental appt and schedule PCP appt.  Follow Up Plan:  The patient has been provided with contact information for the care management team and has been advised to call with any health related questions or concerns.  The care management team will reach out to the patient again over the next 30 business  days.

## 2022-06-15 NOTE — Patient Outreach (Signed)
Medicaid Managed Care   Nurse Care Manager Note  06/15/2022 Name:  [name] MRN:  [id number] DOB:  1965-06-06  [name] is an 57 y.o. year old male who is a primary patient of Clifford Laroche, [name].  The [facility] Managed Care Coordination team was consulted for assistance with:    Chronic healthcare management needs, HTN, tobacco use, h/o stroke, arthritis, anxiety, neuropathy. , h/o stroke  [name] was given information about Medicaid Managed Care Coordination team services today. [name] Patient agreed to services and verbal consent obtained.  Engaged with patient by telephone for follow up visit in response to provider referral for case management and/or care coordination services.   Assessments/Interventions:  Review of past medical history, allergies, medications, health status, including review of consultants reports, laboratory and other test data, was performed as part of comprehensive evaluation and provision of chronic care management services.  SDOH (Social Determinants of Health) assessments and interventions performed: SDOH Interventions    Flowsheet Row Patient Outreach Telephone from 06/15/2022 in [place] POPULATION HEALTH DEPARTMENT Erroneous Encounter from 06/01/2022 in [facility] Patient Outreach Telephone from 05/13/2022 in [place] POPULATION HEALTH DEPARTMENT Patient Outreach Telephone from 04/21/2022 in [place] POPULATION HEALTH DEPARTMENT Patient Outreach Telephone from 03/19/2022 in [place] POPULATION HEALTH DEPARTMENT Patient Outreach Telephone from 02/22/2022 in [place] POPULATION HEALTH DEPARTMENT  SDOH Interventions        Food Insecurity Interventions -- Intervention Not Indicated -- Intervention Not Indicated -- --  Housing Interventions Intervention Not Indicated Intervention Not Indicated -- Intervention Not Indicated -- --  Transportation Interventions -- Intervention Not Indicated Intervention Not Indicated --  -- --  Utilities Interventions -- Intervention Not Indicated Intervention Not Indicated -- -- --  Alcohol Usage Interventions Intervention Not Indicated (Score <7) -- -- -- -- --  Financial Strain Interventions -- Intervention Not Indicated -- -- Intervention Not Indicated --  Physical Activity Interventions -- Intervention Not Indicated  [patient with right leg weakness and some left leg numbeness-residual effects from stroke] -- -- -- Intervention Not Indicated  [patient with right leg weakness and some left leg numbeness-residual effects from stroke]  Stress Interventions -- Intervention Not Indicated -- -- -- --  Social Connections Interventions -- Intervention Not Indicated -- -- -- Intervention Not Indicated     Care Plan  No Known Allergies  Medications Reviewed Today     Reviewed by [name], RN (Registered Nurse) on 06/15/22 at 1240  Med List Status: <None>   Medication Order Taking? Sig Documenting Provider Last Dose Status Informant  amLODipine (NORVASC) 10 MG tablet [id number] Yes Take 1 tablet (10 mg total) by mouth daily. Clifford Laroche, [name] Taking Active   amoxicillin-clavulanate (AUGMENTIN) 875-125 MG tablet [id number]  Take 1 tablet by mouth every 12 (twelve) hours.  Patient not taking: Reported on 06/14/2022   Clifford Garter, PA  Active   hydrOXYzine (ATARAX) 25 MG tablet [id number] No Take 1 tablet (25 mg total) by mouth every 8 (eight) hours as needed for anxiety.  Patient not taking: Reported on 06/15/2022   Clifford Phlegm, MD Not Taking Active   olmesartan (BENICAR) 20 MG tablet [id number]  Take 1 tablet (20 mg total) by mouth daily.  Patient not taking: Reported on 04/27/2022   Clifford Laroche, [name]  Active   oxyCODONE (ROXICODONE) 5 MG immediate release tablet [id number] No Take 1 tablet (5 mg total) by mouth every 6 (six) hours as needed for severe pain or breakthrough  pain.  Patient not taking: Reported on 06/15/2022   Clifford Garter, PA Not Taking Active    pantoprazole (PROTONIX) 40 MG tablet [id number] No Take 1 tablet (40 mg total) by mouth daily at 12 noon.  Patient not taking: Reported on 06/15/2022   Clifford Laroche, [name] Not Taking Active   sertraline (ZOLOFT) 50 MG tablet [id number] No Take 1 tablet (50 mg total) by mouth daily.  Patient not taking: Reported on 06/15/2022   Clifford Laroche, [name] Not Taking Active            Patient Active Problem List   Diagnosis Date Noted   Erectile dysfunction 03/17/2022   Inguinal bulge 03/17/2022   Depression due to old stroke 02/13/2022   Encounter for general adult medical examination with abnormal findings 02/13/2022   Elevated LDL cholesterol level 02/13/2022   Primary hypertension 02/13/2022   Intermittent explosive disorder 11/22/2021   Protein-calorie malnutrition, severe 10/31/2021   Anoxic brain injury (HCC) 10/31/2021   Acute respiratory failure with hypoxia (HCC)    Cerebrovascular accident (CVA) (HCC)    Acute encephalopathy 10/22/2021   Fever    Pleural effusion    Deep venous thrombosis (HCC)    Pain and swelling of left lower leg 12/28/2018   Septic arthritis of shoulder, right (HCC) 09/07/2018   Septic arthritis of sacroiliac joint (HCC) 09/07/2018   Sternoclavicular joint pain, left 09/07/2018   Discitis of lumbosacral region 06/28/2018   Psoas abscess, right (HCC) 06/28/2018   Polysubstance abuse (HCC) 06/24/2018   Normocytic anemia 06/24/2018   Recurrent boils 06/24/2018   Head injury    Septic arthritis of knee, left (HCC)    Acute medial meniscal tear, left, initial encounter    GERD (gastroesophageal reflux disease) 06/22/2018   Mild protein-calorie malnutrition (HCC) 06/22/2018   AKI (acute kidney injury) (HCC) 06/21/2018   Spinal stenosis of lumbosacral region 06/21/2018   Chronic hepatitis C (HCC) 12/17/2013   Cigarette smoker 12/17/2013   Family history of diabetes mellitus (DM) 12/17/2013   Notalgia 12/17/2013   Conditions to be addressed/monitored per  PCP order:  Chronic healthcare management needs, HTN, tobacco use, h/o stroke, arthritis, anxiety, neuropathy.  Care Plan : RN Care Manager Plan of Care  Updates made by [name], RN since 06/15/2022 12:00 AM     Problem: Health Promotion or Disease Self-Management (General Plan of Care)      Long-Range Goal: Chronic Disease Management   Start Date: 12/16/2021  Expected End Date: 08/12/2022  Priority: High  Note:   Current Barriers:  Knowledge Deficits related to plan of care for management of recent ischemic stroke, arthritis, anxiety/depression, tobacco use, GERD, HTN, h/o DVT Chronic Disease Management support and education needs related to recent ischemic stroke, arthritis, anxiety/depression, tobacco use, GERD, HTN, h/o DVT 06/15/22:  Patient to reschedule dental appt, to schedule PCP appt for BP f/u.  Patient states Zoloft not helpful-to f/u with PCP, also needs Protonix refill.  Patient states BP WNL at home, no exact readings available today.  Smokes 1/2 ppd.  RNCM Clinical Goal(s):  Patient will verbalize understanding of plan for management of ischemic stroke  as evidenced by patient report and follow up  through collaboration with RN Care manager, provider, and care team.   Interventions: Inter-disciplinary care team collaboration (see longitudinal plan of care) Evaluation of current treatment plan related to  self management and patient's adherence to plan as established by provider Patient provided with eye provider information.  Hypertension Interventions:  (Status:  New goal.) Long Term Goal Last practice recorded BP readings:  BP Readings from Last 3 Encounters:  02/10/22 (!) 142/100  12/30/21 (!) 132/91  12/29/21 126/88  05/12/22:            137/100  Most recent eGFR/CrCl: No results found for: "EGFR"  No components found for: "CRCL"  Evaluation of current treatment plan related to hypertension self management and patient's adherence to plan as established by  provider Reviewed medications with patient and discussed importance of compliance Discussed plans with patient for ongoing care management follow up and provided patient with direct contact information for care management team Reviewed scheduled/upcoming provider appointments including:  Assessed social determinant of health barriers   Stroke:  (Status:New goal.) Long Term Goal Reviewed Importance of taking all medications as prescribed Reviewed Importance of attending all scheduled provider appointments Advised to report any changes in symptoms or exercise tolerance Assessed social determinant of health barriers Assessed for signs and symptoms of stroke Assessed for cognitive impairment Assessed use of tobacco use  Patient Goals/Self-Care Activities: Take all medications as prescribed Attend all scheduled provider appointments Call pharmacy for medication refills 3-7 days in advance of running out of medications Perform all self care activities independently  Perform IADL's (shopping, preparing meals, housekeeping, managing finances) independently Call provider office for new concerns or questions  Patient to follow up with provider regarding Zoloft Patient's DPR to call and schedule CT Pelvis as ordered. Patient to reschedule dental appt and schedule PCP appt.  Follow Up Plan:  The patient has been provided with contact information for the care management team and has been advised to call with any health related questions or concerns.  The care management team will reach out to the patient again over the next 30 business  days.    Long-Range Goal: Establish Plan of Care for Chronic Disease Managedment Needs   Priority: High  Note:   Timeframe:  Long-Range Goal Priority:  High Start Date:     12/16/21                        Expected End Date:  ongoing                     Follow Up Date: 07/21/22   - schedule appointment for flu shot - schedule appointment for vaccines needed  due to my age or health - schedule recommended health tests  - schedule and keep appointment for annual check-up    Why is this important?   Screening tests can find diseases early when they are easier to treat.  Your doctor or nurse will talk with you about which tests are important for you.  Getting shots for common diseases like the flu and shingles will help prevent them.   06/15/22:  patient to reschedule dental appt and schedule PCP appt   Follow Up:  Patient agrees to Care Plan and Follow-up.  Plan: The Managed Medicaid care management team will reach out to the patient again over the next 30 business  days. and The  Patient has been provided with contact information for the Managed Medicaid care management team and has been advised to call with any health related questions or concerns.  Date/time of next scheduled RN care management/care coordination outreach: 07/21/22 at 230

## 2022-07-10 DIAGNOSIS — Z419 Encounter for procedure for purposes other than remedying health state, unspecified: Secondary | ICD-10-CM | POA: Diagnosis not present

## 2022-07-21 ENCOUNTER — Other Ambulatory Visit: Payer: Medicaid Other | Admitting: Obstetrics and Gynecology

## 2022-07-21 NOTE — Patient Outreach (Signed)
  Medicaid Managed Care   Unsuccessful Attempt Note   07/21/2022 Name: [name] MRN: [id number] DOB: 08/05/1965  Referred by: [name], [name] Reason for referral : High Risk Managed Medicaid (Unsuccessful telephone outreach)  An unsuccessful telephone outreach was attempted today. The patient was referred to the case management team for assistance with care management and care coordination.    Follow Up Plan: The Managed Medicaid care management team will reach out to the patient again over the next 30 business  days. and The  Patient has been provided with contact information for the Managed Medicaid care management team and has been advised to call with any health related questions or concerns.   [name] RN, BSN [place]  [facility] - Managed Medicaid High Risk [phone]

## 2022-07-21 NOTE — Patient Instructions (Signed)
Hi [name], I hope you are doing well-sorry I missed you today, will reach back out another day.  [name]  - as a part of your Medicaid benefit, you are eligible for care management and care coordination services at no cost or copay. I was unable to reach you by phone today but would be happy to help you with your health related needs. Please feel free to call me at [phone].  A member of the Managed Medicaid care management team will reach out to you again over the next 30 business  days.   [name] RN, BSN [place]  [facility] [employer] - Managed Medicaid High Risk [phone]

## 2022-08-09 DIAGNOSIS — Z419 Encounter for procedure for purposes other than remedying health state, unspecified: Secondary | ICD-10-CM | POA: Diagnosis not present

## 2022-08-16 ENCOUNTER — Other Ambulatory Visit: Payer: Medicaid Other | Admitting: Obstetrics and Gynecology

## 2022-08-16 ENCOUNTER — Encounter: Payer: Self-pay | Admitting: Obstetrics and Gynecology

## 2022-08-16 NOTE — Patient Outreach (Addendum)
Medicaid Managed Care   Nurse Care Manager Note  08/16/2022 Name:  [name] MRN:  [id number] DOB:  Jul 14, 1965  [name] is an 57 y.o. year old male who is a primary patient of Clifford Laroche, [name].  The [facility] Managed Care Coordination team was consulted for assistance with:    Chronic healthcare management needs, HTN, tobacco use, h/o CVA, arthritis, anxiety, neuropathy  [name] was given information about Medicaid Managed Care Coordination team services today. [name] Patient agreed to services and verbal consent obtained.  Engaged with patient by telephone for follow up visit in response to provider referral for case management and/or care coordination services.   Assessments/Interventions:  Review of past medical history, allergies, medications, health status, including review of consultants reports, laboratory and other test data, was performed as part of comprehensive evaluation and provision of chronic care management services.  SDOH (Social Determinants of Health) assessments and interventions performed: SDOH Interventions    Flowsheet Row Patient Outreach Telephone from 08/16/2022 in [place] POPULATION HEALTH DEPARTMENT Patient Outreach Telephone from 06/15/2022 in [place] POPULATION HEALTH DEPARTMENT Erroneous Encounter from 06/01/2022 in [name] [place] [facility] Patient Outreach Telephone from 05/13/2022 in [place] POPULATION HEALTH DEPARTMENT Patient Outreach Telephone from 04/21/2022 in [place] POPULATION HEALTH DEPARTMENT Patient Outreach Telephone from 03/19/2022 in [place] POPULATION HEALTH DEPARTMENT  SDOH Interventions        Food Insecurity Interventions Intervention Not Indicated -- Intervention Not Indicated -- Intervention Not Indicated --  Housing Interventions -- Intervention Not Indicated Intervention Not Indicated -- Intervention Not Indicated --  Transportation Interventions -- -- Intervention Not Indicated Intervention Not  Indicated -- --  Utilities Interventions -- -- Intervention Not Indicated Intervention Not Indicated -- --  Alcohol Usage Interventions -- Intervention Not Indicated (Score <7) -- -- -- --  Financial Strain Interventions -- -- Intervention Not Indicated -- -- Intervention Not Indicated  Physical Activity Interventions -- -- Intervention Not Indicated  [patient with right leg weakness and some left leg numbeness-residual effects from stroke] -- -- --  Stress Interventions -- -- Intervention Not Indicated -- -- --  Social Connections Interventions -- -- Intervention Not Indicated -- -- --     Care Plan  No Known Allergies  Medications Reviewed Today     Reviewed by [name], RN (Registered Nurse) on 08/16/22 at 1437  Med List Status: <None>   Medication Order Taking? Sig Documenting Provider Last Dose Status Informant  amLODipine (NORVASC) 10 MG tablet [id number] No Take 1 tablet (10 mg total) by mouth daily.  Patient not taking: Reported on 08/16/2022   Clifford Laroche, [name] Not Taking Active   amoxicillin-clavulanate (AUGMENTIN) 875-125 MG tablet [id number]  Take 1 tablet by mouth every 12 (twelve) hours.  Patient not taking: Reported on 06/14/2022   [name], PA  Active   hydrOXYzine (ATARAX) 25 MG tablet [id number]  Take 1 tablet (25 mg total) by mouth every 8 (eight) hours as needed for anxiety.  Patient not taking: Reported on 06/15/2022   [name], MD  Active   olmesartan (BENICAR) 20 MG tablet [id number]  Take 1 tablet (20 mg total) by mouth daily.  Patient not taking: Reported on 04/27/2022   Clifford Laroche, [name]  Active   oxyCODONE (ROXICODONE) 5 MG immediate release tablet [id number]  Take 1 tablet (5 mg total) by mouth every 6 (six) hours as needed for severe pain or breakthrough pain.  Patient not taking: Reported on 06/15/2022  Sherian Maroon A, PA  Active   pantoprazole (PROTONIX) 40 MG tablet [id number]  Take 1 tablet (40 mg total) by mouth daily at 12 noon.   Patient not taking: Reported on 06/15/2022   Clifford Laroche, [name]  Active   sertraline (ZOLOFT) 50 MG tablet [id number]  Take 1 tablet (50 mg total) by mouth daily.  Patient not taking: Reported on 06/15/2022   Clifford Laroche, [name]  Active            Patient Active Problem List   Diagnosis Date Noted   Erectile dysfunction 03/17/2022   Inguinal bulge 03/17/2022   Depression due to old stroke 02/13/2022   Encounter for general adult medical examination with abnormal findings 02/13/2022   Elevated LDL cholesterol level 02/13/2022   Primary hypertension 02/13/2022   Intermittent explosive disorder 11/22/2021   Protein-calorie malnutrition, severe 10/31/2021   Anoxic brain injury (HCC) 10/31/2021   Acute respiratory failure with hypoxia (HCC)    Cerebrovascular accident (CVA) (HCC)    Acute encephalopathy 10/22/2021   Fever    Pleural effusion    Deep venous thrombosis (HCC)    Pain and swelling of left lower leg 12/28/2018   Septic arthritis of shoulder, right (HCC) 09/07/2018   Septic arthritis of sacroiliac joint (HCC) 09/07/2018   Sternoclavicular joint pain, left 09/07/2018   Discitis of lumbosacral region 06/28/2018   Psoas abscess, right (HCC) 06/28/2018   Polysubstance abuse (HCC) 06/24/2018   Normocytic anemia 06/24/2018   Recurrent boils 06/24/2018   Head injury    Septic arthritis of knee, left (HCC)    Acute medial meniscal tear, left, initial encounter    GERD (gastroesophageal reflux disease) 06/22/2018   Mild protein-calorie malnutrition (HCC) 06/22/2018   AKI (acute kidney injury) (HCC) 06/21/2018   Spinal stenosis of lumbosacral region 06/21/2018   Chronic hepatitis C (HCC) 12/17/2013   Cigarette smoker 12/17/2013   Family history of diabetes mellitus (DM) 12/17/2013   Notalgia 12/17/2013   Conditions to be addressed/monitored per PCP order:  Chronic healthcare management needs, HTN, tobacco use, h/o CVA, arthritis, anxiety, neuropathy  Care Plan : RN Care  Manager Plan of Care  Updates made by [name], RN since 08/16/2022 12:00 AM     Problem: Health Promotion or Disease Self-Management (General Plan of Care)      Long-Range Goal: Chronic Disease Management   Start Date: 12/16/2021  Expected End Date: 11/16/2022  Priority: High  Note:   Current Barriers:  Knowledge Deficits related to plan of care for management of recent ischemic stroke, arthritis, anxiety/depression, tobacco use, GERD, HTN, h/o DVT Chronic Disease Management support and education needs related to recent ischemic stroke, arthritis, anxiety/depression, tobacco use, GERD, HTN, h/o DVT 08/16/22:  Patient out of medications as has not followed up with provider-has appt 7/9.  No exact BP readings available today from patient-states "in and out"  To make dental appt-no change in tobacco use.  RNCM Clinical Goal(s):  Patient will verbalize understanding of plan for management of ischemic stroke  as evidenced by patient report and follow up  through collaboration with RN Care manager, provider, and care team.   Interventions: Inter-disciplinary care team collaboration (see longitudinal plan of care) Evaluation of current treatment plan related to  self management and patient's adherence to plan as established by provider Patient provided with eye provider information.  Hypertension Interventions:  (Status:  New goal.) Long Term Goal Last practice recorded BP readings:  BP Readings from Last 3 Encounters:  02/10/22 Marland Kitchen)  142/100  12/30/21 (!) 132/91  12/29/21 126/88  05/12/22:            137/100  Most recent eGFR/CrCl: No results found for: "EGFR"  No components found for: "CRCL"  Evaluation of current treatment plan related to hypertension self management and patient's adherence to plan as established by provider Reviewed medications with patient and discussed importance of compliance Discussed plans with patient for ongoing care management follow up and provided patient  with direct contact information for care management team Reviewed scheduled/upcoming provider appointments including:  Assessed social determinant of health barriers   Stroke:  (Status:New goal.) Long Term Goal Reviewed Importance of taking all medications as prescribed Reviewed Importance of attending all scheduled provider appointments Advised to report any changes in symptoms or exercise tolerance Assessed social determinant of health barriers Assessed for signs and symptoms of stroke Assessed for cognitive impairment Assessed use of tobacco use  Patient Goals/Self-Care Activities: Take all medications as prescribed Attend all scheduled provider appointments Call pharmacy for medication refills 3-7 days in advance of running out of medications Perform all self care activities independently  Perform IADL's (shopping, preparing meals, housekeeping, managing finances) independently Call provider office for new concerns or questions  Patient to follow up with provider regarding Zoloft Patient's DPR to call and schedule CT Pelvis as ordered. Patient to reschedule dental appt and schedule PCP appt.  Follow Up Plan:  The patient has been provided with contact information for the care management team and has been advised to call with any health related questions or concerns.  The care management team will reach out to the patient again over the next 30 business  days.    Long-Range Goal: Establish Plan of Care for Chronic Disease Managedment Needs   Priority: High  Note:   Timeframe:  Long-Range Goal Priority:  High Start Date:     12/16/21                        Expected End Date:  ongoing                     Follow Up Date: 09/16/22   - schedule appointment for flu shot - schedule appointment for vaccines needed due to my age or health - schedule recommended health tests  - schedule and keep appointment for annual check-up    Why is this important?   Screening tests can find  diseases early when they are easier to treat.  Your doctor or nurse will talk with you about which tests are important for you.  Getting shots for common diseases like the flu and shingles will help prevent them.   08/16/22:  PCP appt 7/10   Follow Up:  Patient agrees to Care Plan and Follow-up.  Plan: The Managed Medicaid care management team will reach out to the patient again over the next 30 business  days. and The  Patient has been provided with contact information for the Managed Medicaid care management team and has been advised to call with any health related questions or concerns.  Date/time of next scheduled RN care management/care coordination outreach: 09/16/22

## 2022-08-16 NOTE — Patient Instructions (Signed)
Visit Information  Mr. Overton was given information about Medicaid Managed Care team care coordination services as a part of their [facility] Medicaid benefit. Beto Caselli verbally consented to engagement with the [facility] Managed Care team.   If you are experiencing a medical emergency, please call 911 or report to your local emergency department or urgent care.   If you have a non-emergency medical problem during routine business hours, please contact your provider's office and ask to speak with a nurse.   For questions related to your [facility] health plan, please call: [phone] or go here:[email or web address]  If you would like to schedule transportation through your [facility] plan, please call the following number at least 2 days in advance of your appointment: [phone].   You can also use the MTM portal or MTM mobile app to manage your rides. Reimbursement for transportation is available through [facility]! For the portal, please go to [email or web address]  Call the [facility] Crisis Line at [phone], at any time, 24 hours a day, 7 days a week. If you are in danger or need immediate medical attention call 911.  If you would like help to quit smoking, call 1-800-QUIT-NOW (802 794 0102) OR Espaol: 1-855-Djelo-Ya (3-664-403-4742) o para ms informacin haga clic aqu or Text READY to 595-638 to register via text  [name] - following are the goals we discussed in your visit today:   Goals Addressed    Timeframe:  Long-Range Goal Priority:  High Start Date:     12/16/21                        Expected End Date:  ongoing                     Follow Up Date: 09/16/22   - schedule appointment for flu shot - schedule appointment for vaccines needed due to my age or health - schedule recommended health tests  - schedule and keep appointment for annual check-up    Why is this important?   Screening tests can find diseases early when they are  easier to treat.  Your doctor or nurse will talk with you about which tests are important for you.  Getting shots for common diseases like the flu and shingles will help prevent them.   08/16/22:  PCP appt 7/10  Patient verbalizes understanding of instructions and care plan provided today and agrees to view in MyChart. Active MyChart status and patient understanding of how to access instructions and care plan via MyChart confirmed with patient.     The Managed Medicaid care management team will reach out to the patient again over the next 30 business  days.  The  Patient has been provided with contact information for the Managed Medicaid care management team and has been advised to call with any health related questions or concerns.   [name] RN, BSN [place]  [facility] Care Management Coordinator - Managed Medicaid High Risk [phone]   Following is a copy of your plan of care:  Care Plan : RN Care Manager Plan of Care  Updates made by [name], RN since 08/16/2022 12:00 AM     Problem: Health Promotion or Disease Self-Management (General Plan of Care)      Long-Range Goal: Chronic Disease Management   Start Date: 12/16/2021  Expected End Date: 11/16/2022  Priority: High  Note:   Current Barriers:  Knowledge Deficits related to plan of care for  management of recent ischemic stroke, arthritis, anxiety/depression, tobacco use, GERD, HTN, h/o DVT Chronic Disease Management support and education needs related to recent ischemic stroke, arthritis, anxiety/depression, tobacco use, GERD, HTN, h/o DVT 09/16/22:  Patient out of medications as has not followed up with provider-has appt 7/10  No exact BP readings available today from patient-states "in and out"  To make dental appt-no change in tobacco use.  RNCM Clinical Goal(s):  Patient will verbalize understanding of plan for management of ischemic stroke  as evidenced by patient report and follow up  through  collaboration with RN Care manager, provider, and care team.   Interventions: Inter-disciplinary care team collaboration (see longitudinal plan of care) Evaluation of current treatment plan related to  self management and patient's adherence to plan as established by provider Patient provided with eye provider information.  Hypertension Interventions:  (Status:  New goal.) Long Term Goal Last practice recorded BP readings:  BP Readings from Last 3 Encounters:  02/10/22 (!) 142/100  12/30/21 (!) 132/91  12/29/21 126/88  05/12/22:            137/100  Most recent eGFR/CrCl: No results found for: "EGFR"  No components found for: "CRCL"  Evaluation of current treatment plan related to hypertension self management and patient's adherence to plan as established by provider Reviewed medications with patient and discussed importance of compliance Discussed plans with patient for ongoing care management follow up and provided patient with direct contact information for care management team Reviewed scheduled/upcoming provider appointments including:  Assessed social determinant of health barriers   Stroke:  (Status:New goal.) Long Term Goal Reviewed Importance of taking all medications as prescribed Reviewed Importance of attending all scheduled provider appointments Advised to report any changes in symptoms or exercise tolerance Assessed social determinant of health barriers Assessed for signs and symptoms of stroke Assessed for cognitive impairment Assessed use of tobacco use  Patient Goals/Self-Care Activities: Take all medications as prescribed Attend all scheduled provider appointments Call pharmacy for medication refills 3-7 days in advance of running out of medications Perform all self care activities independently  Perform IADL's (shopping, preparing meals, housekeeping, managing finances) independently Call provider office for new concerns or questions  Patient to follow up with  provider regarding Zoloft Patient's DPR to call and schedule CT Pelvis as ordered. Patient to reschedule dental appt and schedule PCP appt.  Follow Up Plan:  The patient has been provided with contact information for the care management team and has been advised to call with any health related questions or concerns.  The care management team will reach out to the patient again over the next 30 business  days.

## 2022-08-18 ENCOUNTER — Encounter: Payer: Self-pay | Admitting: *Deleted

## 2022-08-18 ENCOUNTER — Encounter: Payer: Self-pay | Admitting: Family Medicine

## 2022-08-18 ENCOUNTER — Ambulatory Visit: Payer: Medicaid Other | Admitting: Family Medicine

## 2022-08-18 VITALS — BP 134/86 | HR 95 | Resp 17 | Ht 70.0 in | Wt 184.0 lb

## 2022-08-18 DIAGNOSIS — E7849 Other hyperlipidemia: Secondary | ICD-10-CM

## 2022-08-18 DIAGNOSIS — R7301 Impaired fasting glucose: Secondary | ICD-10-CM | POA: Diagnosis not present

## 2022-08-18 DIAGNOSIS — I10 Essential (primary) hypertension: Secondary | ICD-10-CM

## 2022-08-18 DIAGNOSIS — K219 Gastro-esophageal reflux disease without esophagitis: Secondary | ICD-10-CM | POA: Diagnosis not present

## 2022-08-18 DIAGNOSIS — E038 Other specified hypothyroidism: Secondary | ICD-10-CM

## 2022-08-18 DIAGNOSIS — E559 Vitamin D deficiency, unspecified: Secondary | ICD-10-CM | POA: Diagnosis not present

## 2022-08-18 DIAGNOSIS — Z1211 Encounter for screening for malignant neoplasm of colon: Secondary | ICD-10-CM

## 2022-08-18 MED ORDER — PANTOPRAZOLE SODIUM 40 MG PO TBEC: 40.0000 mg | DELAYED_RELEASE_TABLET | Freq: Every day | ORAL | 1 refills | Status: DC

## 2022-08-18 MED ORDER — OLMESARTAN MEDOXOMIL 20 MG PO TABS
20.0000 mg | ORAL_TABLET | Freq: Every day | ORAL | 1 refills | Status: DC
Start: 2022-08-18 — End: 2022-11-22

## 2022-08-18 MED ORDER — AMLODIPINE BESYLATE 10 MG PO TABS: 10.0000 mg | ORAL_TABLET | Freq: Every day | ORAL | 1 refills | Status: DC

## 2022-08-18 NOTE — Assessment & Plan Note (Signed)
Refill sent to the pharmacy 

## 2022-08-18 NOTE — Assessment & Plan Note (Signed)
Uncontrolled He takes olmesartan 20 mg and amlodipine 10 mg Patient reports being out of his meds for 2 weeks Refills sent to his pharmacy No sodium diet with increased physical activity encouraged BP Readings from Last 3 Encounters:  08/18/22 134/86  04/29/22 (!) 147/102  04/27/22 (!) 140/88

## 2022-08-18 NOTE — Patient Instructions (Addendum)
I appreciate the opportunity to provide care to you today!    Follow up:  4 months  Labs: please stop by the lab during the week to get your blood drawn (CBC, CMP, TSH, Lipid profile, HgA1c, Vit D)   Referrals today-  GI for colonoscopy   Attached with your AVS, you will find valuable resources for self-education. I highly recommend dedicating some time to thoroughly examine them.   Please continue to a heart-healthy diet and increase your physical activities. Try to exercise for at least five days a week.    It was a pleasure to see you and I look forward to continuing to work together on your health and well-being. Please do not hesitate to call the office if you need care or have questions about your care.  In case of emergency, please visit the Emergency Department for urgent care, or contact our clinic at [phone] to schedule an appointment. We're here to help you!   Have a wonderful day and week. With Gratitude, [name] MSN, [name]-BC

## 2022-08-18 NOTE — Progress Notes (Signed)
Established Patient Office Visit  Subjective:  Patient ID: [name], male    DOB: 07-20-1965  Age: 57 y.o. MRN: [id number]  CC:  Chief Complaint  Patient presents with   Hypertension    Has been out of all meds for about 2 weeks     HPI [name] is a 57 y.o. male with past medical history of HTN, GERD, and cigarette smoking presents for f/u of  chronic medical conditions. For the details of today's visit, please refer to the assessment and plan.     Past Medical History:  Diagnosis Date   Acid reflux    Head injury    Hepatitis C    Hiatal hernia    Stroke [facility])     Past Surgical History:  Procedure Laterality Date   ACROMIO-CLAVICULAR JOINT REPAIR Right 06/28/2018   Procedure: ACROMIO-CLAVICULAR JOINT IRRIGATION AND DEBRIDEMENT;  Surgeon: Cammy Copa, MD;  Location: [facility] OR;  Service: Orthopedics;  Laterality: Right;   FACIAL FRACTURE SURGERY     IRRIGATION AND DEBRIDEMENT SHOULDER Right 06/28/2018   Procedure: IRRIGATION AND DEBRIDEMENT SHOULDER;  Surgeon: Cammy Copa, MD;  Location: [facility] OR;  Service: Orthopedics;  Laterality: Right;   KNEE ARTHROSCOPY Left 06/23/2018   Procedure: LEFT KNEE ARTHROSCOPY KNEE I&D.;  Surgeon: Cammy Copa, MD;  Location: [facility] OR;  Service: Orthopedics;  Laterality: Left;    Family History  Problem Relation Age of Onset   Diabetes Mother    Hypertension Mother    Stroke Father    Hypertension Father     Social History   Socioeconomic History   Marital status: Single    Spouse name: Not on file   Number of children: Not on file   Years of education: Not on file   Highest education level: Not on file  Occupational History   Not on file  Tobacco Use   Smoking status: Every Day    Packs/day: 1.00    Years: 20.00    Additional pack years: 0.00    Total pack years: 20.00    Types: Cigarettes   Smokeless tobacco: Never   Tobacco comments:    1 cig a day since the last visit.  Vaping Use   Vaping Use:  Never used  Substance and Sexual Activity   Alcohol use: Yes    Comment: socially    Drug use: Not Currently    Types: Cocaine    Comment: states its "been a long time"    Sexual activity: Not Currently  Other Topics Concern   Not on file  Social History Narrative   Not on file   Social Determinants of Health   Financial Resource Strain: Low Risk  (06/01/2022)   Overall Financial Resource Strain (CARDIA)    Difficulty of Paying Living Expenses: Not very hard  Food Insecurity: No Food Insecurity (08/16/2022)   Hunger Vital Sign    Worried About Running Out of Food in the Last Year: Never true    Ran Out of Food in the Last Year: Never true  Transportation Needs: No Transportation Needs (06/01/2022)   PRAPARE - Administrator, Civil Service (Medical): No    Lack of Transportation (Non-Medical): No  Physical Activity: Insufficiently Active (06/01/2022)   Exercise Vital Sign    Days of Exercise per Week: 7 days    Minutes of Exercise per Session: 10 min  Stress: No Stress Concern Present (06/01/2022)   [employer] of Occupational Health - Occupational Stress  Questionnaire    Feeling of Stress : Only a little  Social Connections: Moderately Isolated (06/01/2022)   Social Connection and Isolation Panel [NHANES]    Frequency of Communication with Friends and Family: More than three times a week    Frequency of Social Gatherings with Friends and Family: More than three times a week    Attends Religious Services: Never    Database administrator or Organizations: No    Attends Banker Meetings: Never    Marital Status: Living with partner  Intimate Partner Violence: Not At Risk (06/01/2022)   Humiliation, Afraid, Rape, and Kick questionnaire    Fear of Current or Ex-Partner: No    Emotionally Abused: No    Physically Abused: No    Sexually Abused: No    Outpatient Medications Prior to Visit  Medication Sig Dispense Refill   amoxicillin-clavulanate  (AUGMENTIN) 875-125 MG tablet Take 1 tablet by mouth every 12 (twelve) hours. (Patient not taking: Reported on 06/14/2022) 20 tablet 0   hydrOXYzine (ATARAX) 25 MG tablet Take 1 tablet (25 mg total) by mouth every 8 (eight) hours as needed for anxiety. (Patient not taking: Reported on 06/15/2022) 30 tablet 0   oxyCODONE (ROXICODONE) 5 MG immediate release tablet Take 1 tablet (5 mg total) by mouth every 6 (six) hours as needed for severe pain or breakthrough pain. (Patient not taking: Reported on 06/15/2022) 8 tablet 0   sertraline (ZOLOFT) 50 MG tablet Take 1 tablet (50 mg total) by mouth daily. (Patient not taking: Reported on 06/15/2022) 90 tablet 1   amLODipine (NORVASC) 10 MG tablet Take 1 tablet (10 mg total) by mouth daily. (Patient not taking: Reported on 08/16/2022) 90 tablet 1   olmesartan (BENICAR) 20 MG tablet Take 1 tablet (20 mg total) by mouth daily. (Patient not taking: Reported on 04/27/2022) 30 tablet 1   pantoprazole (PROTONIX) 40 MG tablet Take 1 tablet (40 mg total) by mouth daily at 12 noon. (Patient not taking: Reported on 06/15/2022) 90 tablet 1   No facility-administered medications prior to visit.    No Known Allergies  ROS Review of Systems  Constitutional:  Negative for fatigue and fever.  Eyes:  Negative for visual disturbance.  Respiratory:  Negative for chest tightness and shortness of breath.   Cardiovascular:  Negative for chest pain and palpitations.  Neurological:  Negative for dizziness and headaches.      Objective:    Physical Exam HENT:     Head: Normocephalic.     Right Ear: External ear normal.     Left Ear: External ear normal.     Nose: No congestion or rhinorrhea.     Mouth/Throat:     Mouth: Mucous membranes are moist.  Cardiovascular:     Rate and Rhythm: Regular rhythm.     Heart sounds: No murmur heard. Pulmonary:     Effort: No respiratory distress.     Breath sounds: Normal breath sounds.  Neurological:     Mental Status: He is alert.      BP 134/86   Pulse 95   Resp 17   Ht 5\' 10"  (1.778 m)   Wt 184 lb (83.5 kg)   SpO2 93%   BMI 26.40 kg/m  Wt Readings from Last 3 Encounters:  08/18/22 184 lb (83.5 kg)  04/29/22 185 lb (83.9 kg)  04/27/22 190 lb (86.2 kg)    Lab Results  Component Value Date   TSH 2.234 10/22/2021   Lab Results  Component Value Date   WBC 16.5 (H) 04/29/2022   HGB 15.9 04/29/2022   HCT 46.8 04/29/2022   MCV 92.7 04/29/2022   PLT 346 04/29/2022   Lab Results  Component Value Date   NA 137 04/29/2022   K 4.3 04/29/2022   CO2 25 04/29/2022   GLUCOSE 112 (H) 04/29/2022   BUN 19 04/29/2022   CREATININE 1.00 04/29/2022   BILITOT 0.5 12/30/2021   ALKPHOS 98 12/30/2021   AST 24 12/30/2021   ALT 38 12/30/2021   PROT 7.3 12/30/2021   ALBUMIN 3.9 12/30/2021   CALCIUM 9.6 04/29/2022   ANIONGAP 10 04/29/2022   EGFR 100 04/27/2022   Lab Results  Component Value Date   CHOL 161 10/22/2021   Lab Results  Component Value Date   HDL 16 (L) 10/22/2021   Lab Results  Component Value Date   LDLCALC 97 10/22/2021   Lab Results  Component Value Date   TRIG 238 (H) 10/22/2021   Lab Results  Component Value Date   CHOLHDL 10.1 10/22/2021   Lab Results  Component Value Date   HGBA1C 5.5 10/22/2021      Assessment & Plan:  Primary hypertension Assessment & Plan: Uncontrolled He takes olmesartan 20 mg and amlodipine 10 mg Patient reports being out of his meds for 2 weeks Refills sent to his pharmacy No sodium diet with increased physical activity encouraged BP Readings from Last 3 Encounters:  08/18/22 134/86  04/29/22 (!) 147/102  04/27/22 (!) 140/88     Orders: -     amLODIPine Besylate; Take 1 tablet (10 mg total) by mouth daily.  Dispense: 90 tablet; Refill: 1 -     Olmesartan Medoxomil; Take 1 tablet (20 mg total) by mouth daily.  Dispense: 90 tablet; Refill: 1  Gastroesophageal reflux disease, unspecified whether esophagitis present Assessment & Plan: Refill  sent to the pharmacy  Orders: -     Pantoprazole Sodium; Take 1 tablet (40 mg total) by mouth daily at 12 noon.  Dispense: 90 tablet; Refill: 1  Colon cancer screening -     Ambulatory referral to Gastroenterology  IFG (impaired fasting glucose) -     Hemoglobin A1c  Vitamin D deficiency -     VITAMIN D 25 Hydroxy (Vit-D Deficiency, Fractures)  Other specified hypothyroidism -     TSH + free T4  Other hyperlipidemia -     Lipid panel -     CMP14+EGFR -     CBC with Differential/Platelet  Note: This chart has been completed using Engineer, civil (consulting) software, and while attempts have been made to ensure accuracy, certain words and phrases may not be transcribed as intended.    Follow-up: Return in about 4 months (around 12/19/2022).   [name], [name]

## 2022-08-20 ENCOUNTER — Ambulatory Visit: Payer: Medicaid Other | Admitting: Obstetrics and Gynecology

## 2022-08-24 ENCOUNTER — Other Ambulatory Visit: Payer: Self-pay | Admitting: *Deleted

## 2022-08-24 DIAGNOSIS — R911 Solitary pulmonary nodule: Secondary | ICD-10-CM

## 2022-08-24 DIAGNOSIS — Z87891 Personal history of nicotine dependence: Secondary | ICD-10-CM

## 2022-08-31 ENCOUNTER — Ambulatory Visit (INDEPENDENT_AMBULATORY_CARE_PROVIDER_SITE_OTHER): Payer: Medicaid Other | Admitting: Physician Assistant

## 2022-08-31 ENCOUNTER — Encounter: Payer: Self-pay | Admitting: Physician Assistant

## 2022-08-31 DIAGNOSIS — Z87891 Personal history of nicotine dependence: Secondary | ICD-10-CM | POA: Diagnosis not present

## 2022-08-31 NOTE — Patient Instructions (Signed)
Thank you for participating in the [place] [facility] Scre[facility]. It was our pleasure to meet you today. We will call you with the results of your scan within the next few days. Your scan will be assigned a Lung RADS category score by the physicians reading the scans.  This Lung RADS score determines follow up scanning.  See below for description of categories, and follow up screening recommendations. We will be in touch to schedule your follow up screening annually or based on recommendations of our providers. We will fax a copy of your scan results to your Primary Care Physician, or the physician who referred you to the program, to ensure they have the results. Please call the office if you have any questions or concerns regarding your scanning experience or results.  Our office number is [phone]. Please speak with Denise Phelps, RN. , or  Denise Buckner RN, They are  our Lung Cancer Screening RN.'s If They are unavailable when you call, Please leave a message on the voice mail. We will return your call at our earliest convenience.This voice mail is monitored several times a day.  Remember, if your scan is normal, we will scan you annually as long as you continue to meet the criteria for the program. (Age 50-80, Current smoker or smoker who has quit within the last 15 years). If you are a smoker, remember, quitting is the single most powerful action that you can take to decrease your risk of lung cancer and other pulmonary, breathing related problems. We know quitting is hard, and we are here to help.  Please let us know if there is anything we can do to help you meet your goal of quitting. If you are a former smoker, congratulations. We are proud of you! Remain smoke free! Remember you can refer friends or family members through the number above.  We will screen them to make sure they meet criteria for the program. Thank you for helping us take better care of you by  participating in Lung Screening.  You can receive free nicotine replacement therapy ( patches, gum or mints) by calling 1-800-QUIT NOW. Please call so we can get you on the path to becoming  a non-smoker. I know it is hard, but you can do this!  Lung RADS Categories:  Lung RADS 1: no nodules or definitely non-concerning nodules.  Recommendation is for a repeat annual scan in 12 months.  Lung RADS 2:  nodules that are non-concerning in appearance and behavior with a very low likelihood of becoming an active cancer. Recommendation is for a repeat annual scan in 12 months.  Lung RADS 3: nodules that are probably non-concerning , includes nodules with a low likelihood of becoming an active cancer.  Recommendation is for a 6-month repeat screening scan. Often noted after an upper respiratory illness. We will be in touch to make sure you have no questions, and to schedule your 6-month scan.  Lung RADS 4 A: nodules with concerning findings, recommendation is most often for a follow up scan in 3 months or additional testing based on our provider's assessment of the scan. We will be in touch to make sure you have no questions and to schedule the recommended 3 month follow up scan.  Lung RADS 4 B:  indicates findings that are concerning. We will be in touch with you to schedule additional diagnostic testing based on our provider's  assessment of the scan.  Other options for assistance in smoking cessation (   As covered by your insurance benefits)  Hypnosis for smoking cessation  [employer] [phone]  Acupuncture for smoking cessation  [facility] [phone]   

## 2022-08-31 NOTE — Progress Notes (Signed)
Virtual Visit via Telephone Note  I connected with [name] on 08/31/22 at 1200 by telephone and verified that I am speaking with the correct person using two identifiers.  Location: Patient: home Provider: working virtually from home   I discussed the limitations, risks, security and privacy concerns of performing an evaluation and management service by telephone and the availability of in person appointments. I also discussed with the patient that there may be a patient responsible charge related to this service. The patient expressed understanding and agreed to proceed.       Shared Decision Making Visit Lung Cancer Screening Program [phone])   Eligibility: Age 37 Pack Years Smoking History Calculation 60 (# packs/per year x # years smoked) Recent History of coughing up blood  no Unexplained weight loss? No ( >Than 15 pounds within the last 6 months ) Prior History Lung / other cancer No (Diagnosis within the last 5 years already requiring surveillance chest CT Scans). Smoking Status Former Smoker Former Smokers: Years since quit: < 1 year  Quit Date: 2024  Visit Components: Discussion included one or more decision making aids? Yes Discussion included risk/benefits of screening. Yes Discussion included potential follow up diagnostic testing for abnormal scans. Yes Discussion included meaning and risk of over diagnosis. Yes Discussion included meaning and risk of False Positives. Yes Discussion included meaning of total radiation exposure. Yes  Counseling Included: Importance of adherence to annual lung cancer LDCT screening. Yes Impact of comorbidities on ability to participate in the program. Yes Ability and willingness to under diagnostic treatment. Yes  Smoking Cessation Counseling: Former Smokers:  Discussed the importance of maintaining cigarette abstinence. Yes Diagnosis Code: Personal History of Nicotine Dependence. V78.469 Information about tobacco  cessation classes and interventions provided to patient. Yes Written Order for Lung Cancer Screening with LDCT placed in Epic. Yes (CT Chest Lung Cancer Screening Low Dose W/O CM) GEX5284 Z12.2-Screening of respiratory organs Z87.891-Personal history of nicotine dependence    I spent 25 minutes of face to face time/virtual visit time  with the patient discussing the risks and benefits of lung cancer screening. We took the time to pause at intervals to allow for questions to be asked and answered to ensure understanding. We discussed that they had taken the single most powerful action possible to decrease their risk of developing lung cancer when they quit smoking. I counseled them to remain smoke free, and to contact the office if they ever had the desire to smoke again so that we can provide resources and tools to help support the effort to remain smoke free. We discussed the time and location of the scan, and they  will receive a call or letter with the results within  24-72 hours of receiving them. They have the office contact information in the event they have questions.   They verbalized understanding of all of the above and had no further questions.    I explained to the patient that there has been a high incidence of coronary artery disease noted on these exams. I explained that this is a non-gated exam therefore degree or severity cannot be determined. This patient is not on statin therapy. I have asked the patient to follow-up with their PCP regarding any incidental finding of coronary artery disease and management with diet or medication as they feel is clinically indicated. The patient verbalized understanding of the above and had no further questions.      Darcella Gasman Fawzi Melman, PA-C

## 2022-09-01 ENCOUNTER — Ambulatory Visit (HOSPITAL_COMMUNITY)
Admission: RE | Admit: 2022-09-01 | Discharge: 2022-09-01 | Disposition: A | Discharge status: Home / Self Care | Payer: Medicaid Other | Source: Ambulatory Visit | Attending: Acute Care | Admitting: Acute Care

## 2022-09-01 DIAGNOSIS — Z87891 Personal history of nicotine dependence: Secondary | ICD-10-CM | POA: Diagnosis not present

## 2022-09-01 DIAGNOSIS — R911 Solitary pulmonary nodule: Secondary | ICD-10-CM | POA: Insufficient documentation

## 2022-09-08 ENCOUNTER — Other Ambulatory Visit: Payer: Self-pay | Admitting: Acute Care

## 2022-09-08 ENCOUNTER — Telehealth: Payer: Self-pay | Admitting: Acute Care

## 2022-09-08 DIAGNOSIS — R918 Other nonspecific abnormal finding of lung field: Secondary | ICD-10-CM

## 2022-09-08 DIAGNOSIS — F17200 Nicotine dependence, unspecified, uncomplicated: Secondary | ICD-10-CM

## 2022-09-08 NOTE — Telephone Encounter (Signed)
[name] with lung cancer screening program is aware and will be reaching out to patient

## 2022-09-08 NOTE — Telephone Encounter (Signed)
IMPRESSION: 1. Trace enlargement of a right upper lobe nodule with slight enlargement a lateral left lower lobe nodule. Lung-RADS 4B, suspicious. Additional imaging evaluation or consultation with Pulmonology or Thoracic Surgery recommended. These results will be called to the ordering clinician or representative by the Radiologist Assistant, and communication documented in the PACS or [employer]. 2.  Emphysema (ICD10-J43.9).   Message routed to ordering provider and DOD.

## 2022-09-08 NOTE — Telephone Encounter (Signed)
I have called the patient with the results of his 54-month follow-up low-dose screening CT.  His initial lung cancer screening CT was done in March 2024 by his primary care provider [name] [name]. The March 2024 scan was read as a lung RADS 4A suspicious.  There was a central right upper lobe macrolobulated pulmonary nodule that measured 18.1 mm.  This had enlarged since previous scan on 09/05/2018 by approximately 1.1 cm. The patient was referred to the lung cancer screening program and follow-up screening scan was scheduled. On follow-up there had been a trace enlargement of the right upper lobe nodule in addition to a slight enlargement in the left lower lobe nodule.  The central right upper lobe had increased in 0.4 mm over the 83-month to 18.5 mm .,  And the left lower lobe nodule had increased in size from 7.1 mm to 8 mm. I explained to the patient that we should do follow-up imaging with a PET scan. Patient verbalized understanding, and is a in agreement with a PET scan. I will order PET scan to be done at [facility]. The patient understands he will get a call to get it scheduled. Sherre Lain, and [place] I have included the patient's PCP on this phone communication. Please follow to make sure PET scan is approved as Medicaid has a tendency to deny.  If the PET scan is denied I will talk to one of the doctors about doing a biopsy without pet imaging prior. . Thanks so much

## 2022-09-08 NOTE — Progress Notes (Signed)
See telephone note dated 08/11/2022

## 2022-09-09 DIAGNOSIS — Z419 Encounter for procedure for purposes other than remedying health state, unspecified: Secondary | ICD-10-CM | POA: Diagnosis not present

## 2022-09-09 NOTE — Telephone Encounter (Signed)
See other telephone note from 09/08/22.

## 2022-09-11 ENCOUNTER — Encounter: Payer: Self-pay | Admitting: Gastroenterology

## 2022-09-11 NOTE — Progress Notes (Unsigned)
Referring Provider: [name], [name] Primary Care Physician:  [name], [name] Primary Gastroenterologist:  Dr. Marletta Lor  No chief complaint on file.   HPI:   [name] is a 57 y.o. male presenting today at the request of [name], [name] for colon cancer screening.    Reports his last colonoscopy was about 30 years ago in [place].  States he did have some polyps that were removed.  Denies any current lower GI problems such as constipation, diarrhea, BRBPR, melena.  No unintentional weight loss. No Fhx of colon cancer.   He does have chronic history of reflux is well-controlled on pantoprazole daily.  No dysphagia, nausea, vomiting.  Reports having a right inguinal hernia at that he wants to be evaluated by general surgery.  States he is being evaluated for possible lung cancer.     Past Medical History:  Diagnosis Date   Acid reflux    Emphysema of lung (HCC)    Head injury    Hepatitis C    S/p treatment with Epclusa in 2020   Hiatal hernia    HTN (hypertension)    Pulmonary nodules    Stroke [facility])     Past Surgical History:  Procedure Laterality Date   ACROMIO-CLAVICULAR JOINT REPAIR Right 06/28/2018   Procedure: ACROMIO-CLAVICULAR JOINT IRRIGATION AND DEBRIDEMENT;  Surgeon: Cammy Copa, MD;  Location: [facility] OR;  Service: Orthopedics;  Laterality: Right;   FACIAL FRACTURE SURGERY     IRRIGATION AND DEBRIDEMENT SHOULDER Right 06/28/2018   Procedure: IRRIGATION AND DEBRIDEMENT SHOULDER;  Surgeon: Cammy Copa, MD;  Location[facility] OR;  Service: Orthopedics;  Laterality: Right;   KNEE ARTHROSCOPY Left 06/23/2018   Procedure: LEFT KNEE ARTHROSCOPY KNEE I&D.;  Surgeon: Cammy Copa, MD;  Location: [facility] OR;  Service: Orthopedics;  Laterality: Left;    Current Outpatient Medications  Medication Sig Dispense Refill   amLODipine (NORVASC) 10 MG tablet Take 1 tablet (10 mg total) by mouth daily. 90 tablet 1   olmesartan (BENICAR) 20 MG tablet  Take 1 tablet (20 mg total) by mouth daily. 90 tablet 1   pantoprazole (PROTONIX) 40 MG tablet Take 1 tablet (40 mg total) by mouth daily at 12 noon. 90 tablet 1   No current facility-administered medications for this visit.    Allergies as of 09/13/2022   (No Known Allergies)    Family History  Problem Relation Age of Onset   Diabetes Mother    Hypertension Mother    Stroke Father    Hypertension Father    Colon cancer Neg Hx     Social History   Socioeconomic History   Marital status: Single    Spouse name: Not on file   Number of children: Not on file   Years of education: Not on file   Highest education level: Not on file  Occupational History   Not on file  Tobacco Use   Smoking status: Former    Current packs/day: 2.00    Average packs/day: 2.0 packs/day for 30.0 years (60.0 ttl pk-yrs)    Types: Cigarettes   Smokeless tobacco: Never   Tobacco comments:    1 cig a day since the last visit.  Vaping Use   Vaping status: Never Used  Substance and Sexual Activity   Alcohol use: Yes    Comment: 6 pack a week; but last drank 08/12/22.   Drug use: Not Currently    Types: Cocaine    Comment: states its "been a long time"  Sexual activity: Not Currently  Other Topics Concern   Not on file  Social History Narrative   Not on file   Social Determinants of Health   Financial Resource Strain: Low Risk  (06/01/2022)   Overall Financial Resource Strain (CARDIA)    Difficulty of Paying Living Expenses: Not very hard  Food Insecurity: No Food Insecurity (08/16/2022)   Hunger Vital Sign    Worried About Running Out of Food in the Last Year: Never true    Ran Out of Food in the Last Year: Never true  Transportation Needs: No Transportation Needs (06/01/2022)   PRAPARE - Administrator, Civil Service (Medical): No    Lack of Transportation (Non-Medical): No  Physical Activity: Insufficiently Active (06/01/2022)   Exercise Vital Sign    Days of Exercise per  Week: 7 days    Minutes of Exercise per Session: 10 min  Stress: No Stress Concern Present (06/01/2022)   [employer] of Occupational Health - Occupational Stress Questionnaire    Feeling of Stress : Only a little  Social Connections: Moderately Isolated (06/01/2022)   Social Connection and Isolation Panel [NHANES]    Frequency of Communication with Friends and Family: More than three times a week    Frequency of Social Gatherings with Friends and Family: More than three times a week    Attends Religious Services: Never    Database administrator or Organizations: No    Attends Banker Meetings: Never    Marital Status: Living with partner  Intimate Partner Violence: Not At Risk (06/01/2022)   Humiliation, Afraid, Rape, and Kick questionnaire    Fear of Current or Ex-Partner: No    Emotionally Abused: No    Physically Abused: No    Sexually Abused: No    Review of Systems: Gen: Denies any fever, chills, cold or flulike symptoms, presyncope, syncope. CV: Denies chest pain, heart palpitations. Resp: Denies shortness of breath, cough. GI: See PI GU : Denies urinary burning, urinary frequency, urinary hesitancy MS: Denies joint pain. Derm: Denies rash. Psych: Denies depression, anxiety. Heme: See HPI  Physical Exam: BP 139/88 (BP Location: Right Arm, Patient Position: Sitting, Cuff Size: Normal)   Pulse 90   Temp 97.6 F (36.4 C) (Temporal)   Ht 5\' 10"  (1.778 m)   Wt 191 lb 6.4 oz (86.8 kg)   SpO2 96%   BMI 27.46 kg/m  General:   Alert and oriented. Pleasant and cooperative. Well-nourished and well-developed.  Head:  Normocephalic and atraumatic. Eyes:  Without icterus, sclera clear and conjunctiva pink.  Ears:  Normal auditory acuity. Lungs:  Clear to auscultation bilaterally. No wheezes, rales, or rhonchi. No distress.  Heart:  S1, S2 present without murmurs appreciated.  Abdomen:  +BS, soft, non-tender and non-distended. No HSM noted. No guarding or  rebound. No masses appreciated.  Rectal:  Deferred  Msk:  Symmetrical without gross deformities. Normal posture. Extremities:  Without edema. Neurologic:  Alert and  oriented x4;  grossly normal neurologically. Skin:  Intact without significant lesions or rashes. Psych:  Normal mood and affect.    Assessment:  57 year old male with history of GERD, HTN, emphysema, pulmonary nodules, hepatitis C s/p treatment with Epclusa achieving SVR, stroke, presenting today to discuss scheduling surveillance colonoscopy.  Patient reports his last colonoscopy was about 30 years ago in [place] with polyps removed.  Denies any current lower GI symptoms, alarm symptoms, or family history of colon cancer.  He is reporting right inguinal hernia  and is requesting to see general surgery for management.   Plan:  Proceed with colonoscopy with propofol by Dr. Marletta Lor in near future. The risks, benefits, and alternatives have been discussed with the patient in detail. The patient states understanding and desires to proceed.  ASA 3 UDS prior due to history of cocaine use Refer to general surgery for inguinal hernia per patient's request.  Follow-up PRN.    Ermalinda Memos, PA-C [facility] Gastroenterology 09/13/2022

## 2022-09-13 ENCOUNTER — Encounter: Payer: Self-pay | Admitting: Gastroenterology

## 2022-09-13 ENCOUNTER — Ambulatory Visit (INDEPENDENT_AMBULATORY_CARE_PROVIDER_SITE_OTHER): Payer: Medicaid Other | Admitting: Gastroenterology

## 2022-09-13 ENCOUNTER — Other Ambulatory Visit: Payer: Self-pay | Admitting: *Deleted

## 2022-09-13 VITALS — BP 139/88 | HR 90 | Temp 97.6°F | Ht 70.0 in | Wt 191.4 lb

## 2022-09-13 DIAGNOSIS — K4090 Unilateral inguinal hernia, without obstruction or gangrene, not specified as recurrent: Secondary | ICD-10-CM | POA: Diagnosis not present

## 2022-09-13 DIAGNOSIS — Z860100 Personal history of colon polyps, unspecified: Secondary | ICD-10-CM | POA: Insufficient documentation

## 2022-09-13 NOTE — Patient Instructions (Signed)
We will get you scheduled for colonoscopy in the near future with Dr. Marletta Lor.  We will place a referral to general surgery for your hernia.  Will follow-up with you in the office as needed.  It was nice to meet you today!  Ermalinda Memos, PA-C [facility] Gastroenterology

## 2022-09-14 ENCOUNTER — Encounter: Payer: Self-pay | Admitting: *Deleted

## 2022-09-14 ENCOUNTER — Encounter: Payer: Self-pay | Admitting: Gastroenterology

## 2022-09-16 ENCOUNTER — Other Ambulatory Visit: Payer: Medicaid Other | Admitting: Obstetrics and Gynecology

## 2022-09-16 ENCOUNTER — Encounter: Payer: Self-pay | Admitting: Obstetrics and Gynecology

## 2022-09-16 ENCOUNTER — Inpatient Hospital Stay (HOSPITAL_COMMUNITY): Admission: RE | Admit: 2022-09-16 | Payer: Medicaid Other | Source: Ambulatory Visit

## 2022-09-16 NOTE — Patient Outreach (Signed)
Medicaid Managed Care   Nurse Care Manager Note  09/16/2022 Name:  [name] MRN:  [id number] DOB:  [birth year]  [name] is an 57 y.o. year old male who is a primary patient of Clifford Laroche, [name].  The [facility] Managed Care Coordination team was consulted for assistance with:    Chronic healthcare management needs, HTN, tobacco use, h/o CVA, arthritis, anxiety, GERD, emphysema, neuropathy  [name] was given information about Medicaid Managed Care Coordination team services today. [name] Patient agreed to services and verbal consent obtained.  Engaged with patient by telephone for follow up visit in response to provider referral for case management and/or care coordination services.   Assessments/Interventions:  Review of past medical history, allergies, medications, health status, including review of consultants reports, laboratory and other test data, was performed as part of comprehensive evaluation and provision of chronic care management services.  SDOH (Social Determinants of Health) assessments and interventions performed: SDOH Interventions    Flowsheet Row Patient Outreach Telephone from 09/16/2022 in [place] POPULATION HEALTH DEPARTMENT Patient Outreach Telephone from 08/16/2022 in [place] POPULATION HEALTH DEPARTMENT Patient Outreach Telephone from 06/15/2022 in [place] POPULATION HEALTH DEPARTMENT Erroneous Encounter from 06/01/2022 in [facility] [name] [facility] Patient Outreach Telephone from 05/13/2022 in [place] POPULATION HEALTH DEPARTMENT Patient Outreach Telephone from 04/21/2022 in [place] POPULATION HEALTH DEPARTMENT  SDOH Interventions        Food Insecurity Interventions -- Intervention Not Indicated -- Intervention Not Indicated -- Intervention Not Indicated  Housing Interventions -- -- Intervention Not Indicated Intervention Not Indicated -- Intervention Not Indicated  Transportation Interventions -- -- -- Intervention Not Indicated  Intervention Not Indicated --  Utilities Interventions -- -- -- Intervention Not Indicated Intervention Not Indicated --  Alcohol Usage Interventions -- -- Intervention Not Indicated (Score <7) -- -- --  Financial Strain Interventions -- -- -- Intervention Not Indicated -- --  Physical Activity Interventions -- -- -- Intervention Not Indicated  [patient with right leg weakness and some left leg numbeness-residual effects from stroke] -- --  Stress Interventions -- -- -- Intervention Not Indicated -- --  Social Connections Interventions -- -- -- Intervention Not Indicated -- --  Health Literacy Interventions Intervention Not Indicated -- -- -- -- --     Care Plan No Known Allergies  Medications Reviewed Today     Reviewed by Clifford Chandler, RN (Registered Nurse) on 09/16/22 at 1422  Med List Status: <None>   Medication Order Taking? Sig Documenting Provider Last Dose Status Informant  amLODipine (NORVASC) 10 MG tablet [id number] No Take 1 tablet (10 mg total) by mouth daily. Clifford Laroche, [name] Taking Active   olmesartan (BENICAR) 20 MG tablet [id number] No Take 1 tablet (20 mg total) by mouth daily. Clifford Laroche, [name] Taking Active   pantoprazole (PROTONIX) 40 MG tablet [id number] No Take 1 tablet (40 mg total) by mouth daily at 12 noon. Clifford Laroche, [name] Taking Active            Patient Active Problem List   Diagnosis Date Noted   History of colonic polyps 09/13/2022   Unilateral inguinal hernia without obstruction or gangrene 09/13/2022   Erectile dysfunction 03/17/2022   Inguinal bulge 03/17/2022   Depression due to old stroke 02/13/2022   Encounter for general adult medical examination with abnormal findings 02/13/2022   Elevated LDL cholesterol level 02/13/2022   Primary hypertension 02/13/2022   Intermittent explosive disorder 11/22/2021   Protein-calorie malnutrition, severe 10/31/2021   Anoxic brain  injury (HCC) 10/31/2021   Acute respiratory failure with hypoxia  (HCC)    Cerebrovascular accident (CVA) (HCC)    Acute encephalopathy 10/22/2021   Fever    Pleural effusion    Deep venous thrombosis (HCC)    Pain and swelling of left lower leg 12/28/2018   Septic arthritis of shoulder, right (HCC) 09/07/2018   Septic arthritis of sacroiliac joint (HCC) 09/07/2018   Sternoclavicular joint pain, left 09/07/2018   Discitis of lumbosacral region 06/28/2018   Psoas abscess, right (HCC) 06/28/2018   Polysubstance abuse (HCC) 06/24/2018   Normocytic anemia 06/24/2018   Recurrent boils 06/24/2018   Head injury    Septic arthritis of knee, left (HCC)    Acute medial meniscal tear, left, initial encounter    GERD (gastroesophageal reflux disease) 06/22/2018   Mild protein-calorie malnutrition (HCC) 06/22/2018   AKI (acute kidney injury) (HCC) 06/21/2018   Spinal stenosis of lumbosacral region 06/21/2018   Chronic hepatitis C (HCC) 12/17/2013   Cigarette smoker 12/17/2013   Family history of diabetes mellitus (DM) 12/17/2013   Notalgia 12/17/2013   Conditions to be addressed/monitored per PCP order:  Chronic healthcare management needs, HTN, tobacco use, h/o CVA, arthritis, anxiety, GERD, emphysema, neuropathy  Care Plan : RN Care Manager Plan of Care  Updates made by Clifford Chandler, RN since 09/16/2022 12:00 AM     Problem: Health Promotion or Disease Self-Management (General Plan of Care)      Long-Range Goal: Chronic Disease Management   Start Date: 12/16/2021  Expected End Date: 11/16/2022  Priority: High  Note:   Current Barriers:  Knowledge Deficits related to plan of care for management of recent ischemic stroke, arthritis, anxiety/depression, tobacco use, GERD, HTN, h/o DVT Chronic Disease Management support and education needs related to recent ischemic stroke, arthritis, anxiety/depression, tobacco use, GERD, HTN, h/o DVT 09/16/22:  patient states he is in need of dental resources-he cannot find anyone to accept his insurance-will refer.  Has  upcoming PETS scan d/t pulomary nodules and then PULM appt.  Also, General Surgeon for " hiatal herniaTo have colonoscopy.  Smoking 2ppd  RNCM Clinical Goal(s):  Patient will verbalize understanding of plan for management of ischemic stroke  as evidenced by patient report and follow up  through collaboration with RN Care manager, provider, and care team.   Interventions: Inter-disciplinary care team collaboration (see longitudinal plan of care) Evaluation of current treatment plan related to  self management and patient's adherence to plan as established by provider Patient provided with eye provider information. Collaborated with [name] [name] referral for dental resources  Hypertension Interventions:  (Status:  New goal.) Long Term Goal Last practice recorded BP readings:  BP Readings from Last 3 Encounters:        12/29/21 126/88  05/12/22:            137/100 08/18/22:          134/86  Most recent eGFR/CrCl: No results found for: "EGFR"  No components found for: "CRCL"  Evaluation of current treatment plan related to hypertension self management and patient's adherence to plan as established by provider Reviewed medications with patient and discussed importance of compliance Discussed plans with patient for ongoing care management follow up and provided patient with direct contact information for care management team Reviewed scheduled/upcoming provider appointments including:  Assessed social determinant of health barriers   Stroke:  (Status:New goal.) Long Term Goal Reviewed Importance of taking all medications as prescribed Reviewed Importance of attending all scheduled provider  appointments Advised to report any changes in symptoms or exercise tolerance Assessed social determinant of health barriers Assessed for signs and symptoms of stroke Assessed for cognitive impairment Assessed use of tobacco use  Patient Goals/Self-Care Activities: Take all medications as prescribed Attend  all scheduled provider appointments Call pharmacy for medication refills 3-7 days in advance of running out of medications Perform all self care activities independently  Perform IADL's (shopping, preparing meals, housekeeping, managing finances) independently Call provider office for new concerns or questions  Patient to follow up with provider regarding Zoloft Patient's DPR to call and schedule CT Pelvis as ordered. Patient to reschedule dental appt and schedule PCP appt.  Follow Up Plan:  The patient has been provided with contact information for the care management team and has been advised to call with any health related questions or concerns.  The care management team will reach out to the patient again over the next 30 business  days.    Long-Range Goal: Establish Plan of Care for Chronic Disease Managedment Needs   Priority: High  Note:   Timeframe:  Long-Range Goal Priority:  High Start Date:     12/16/21                        Expected End Date:  ongoing                     Follow Up Date: 10/19/22   - schedule appointment for flu shot - schedule appointment for vaccines needed due to my age or health - schedule recommended health tests  - schedule and keep appointment for annual check-up    Why is this important?   Screening tests can find diseases early when they are easier to treat.  Your doctor or nurse will talk with you about which tests are important for you.  Getting shots for common diseases like the flu and shingles will help prevent them.   09/16/22:  Has upcoming PULM and General Surgeon appt   Follow Up:  Patient agrees to Care Plan and Follow-up.  Plan: The Managed Medicaid care management team will reach out to the patient again over the next 30 business  days. and The  Patient has been provided with contact information for the Managed Medicaid care management team and has been advised to call with any health related questions or concerns.  Date/time of next  scheduled RN care management/care coordination outreach:  10/19/22 at 1030

## 2022-09-16 NOTE — Patient Instructions (Signed)
Hi [name] you for the updates today, have a good afternoon.  [name] was given information about Medicaid Managed Care team care coordination services as a part of their [facility] Medicaid benefit. [name] verbally consented to engagement with the [facility] Managed Care team.   If you are experiencing a medical emergency, please call 911 or report to your local emergency department or urgent care.   If you have a non-emergency medical problem during routine business hours, please contact your provider's office and ask to speak with a nurse.   For questions related to your [facility] health plan, please call: [phone] or go here:[email or web address]  If you would like to schedule transportation through your [facility] plan, please call the following number at least 2 days in advance of your appointment: [phone].   You can also use the MTM portal or MTM mobile app to manage your rides. Reimbursement for transportation is available through [facility] For the portal, please go to [email or web address]  Call the [facility] Crisis Line at [phone], at any time, 24 hours a day, 7 days a week. If you are in danger or need immediate medical attention call 911.  If you would like help to quit smoking, call 1-800-QUIT-NOW ((907)716-7518) OR Espaol: 1-855-Djelo-Ya (6-440-347-4259) o para ms informacin haga clic aqu or Text READY to 563-875 to register via text  [name] - following are the goals we discussed in your visit today:   Goals Addressed    Timeframe:  Long-Range Goal Priority:  High Start Date:     12/16/21                        Expected End Date:  ongoing                     Follow Up Date: 10/19/22   - schedule appointment for flu shot - schedule appointment for vaccines needed due to my age or health - schedule recommended health tests  - schedule and keep appointment for annual check-up    Why is this important?    Screening tests can find diseases early when they are easier to treat.  Your doctor or nurse will talk with you about which tests are important for you.  Getting shots for common diseases like the flu and shingles will help prevent them.   09/16/22:  Has upcoming PULM and General Surgeon appt  Patient verbalizes understanding of instructions and care plan provided today and agrees to view in MyChart. Active MyChart status and patient understanding of how to access instructions and care plan via MyChart confirmed with patient.     The Managed Medicaid care management team will reach out to the patient again over the next 30 business  days.  The  Patient  has been provided with contact information for the Managed Medicaid care management team and has been advised to call with any health related questions or concerns.   [name] RN, BSN [place]  [facility] Care Management Coordinator - Managed Medicaid High Risk [phone]   Following is a copy of your plan of care:  Care Plan : RN Care Manager Plan of Care  Updates made by [name], RN since 09/16/2022 12:00 AM     Problem: Health Promotion or Disease Self-Management (General Plan of Care)      Long-Range Goal: Chronic Disease Management   Start Date: 12/16/2021  Expected End Date: 11/16/2022  Priority: High  Note:   Current Barriers:  Knowledge Deficits related to plan of care for management of recent ischemic stroke, arthritis, anxiety/depression, tobacco use, GERD, HTN, h/o DVT Chronic Disease Management support and education needs related to recent ischemic stroke, arthritis, anxiety/depression, tobacco use, GERD, HTN, h/o DVT 09/16/22:  patient states he is in need of dental resources-he cannot find anyone to accept his insurance-will refer.  Has upcoming PETS scan d/t pulomary nodules and then PULM appt.  Also, General Surgeon for " hiatal herniaTo have colonoscopy.  Smoking 2ppd  RNCM Clinical Goal(s):   Patient will verbalize understanding of plan for management of ischemic stroke  as evidenced by patient report and follow up  through collaboration with RN Care manager, provider, and care team.   Interventions: Inter-disciplinary care team collaboration (see longitudinal plan of care) Evaluation of current treatment plan related to  self management and patient's adherence to plan as established by provider Patient provided with eye provider information. Collaborated with [name] [name] referral for dental resources  Hypertension Interventions:  (Status:  New goal.) Long Term Goal Last practice recorded BP readings:  BP Readings from Last 3 Encounters:        12/29/21 126/88  05/12/22:            137/100 08/18/22:          134/86  Most recent eGFR/CrCl: No results found for: "EGFR"  No components found for: "CRCL"  Evaluation of current treatment plan related to hypertension self management and patient's adherence to plan as established by provider Reviewed medications with patient and discussed importance of compliance Discussed plans with patient for ongoing care management follow up and provided patient with direct contact information for care management team Reviewed scheduled/upcoming provider appointments including:  Assessed social determinant of health barriers   Stroke:  (Status:New goal.) Long Term Goal Reviewed Importance of taking all medications as prescribed Reviewed Importance of attending all scheduled provider appointments Advised to report any changes in symptoms or exercise tolerance Assessed social determinant of health barriers Assessed for signs and symptoms of stroke Assessed for cognitive impairment Assessed use of tobacco use  Patient Goals/Self-Care Activities: Take all medications as prescribed Attend all scheduled provider appointments Call pharmacy for medication refills 3-7 days in advance of running out of medications Perform all self care activities  independently  Perform IADL's (shopping, preparing meals, housekeeping, managing finances) independently Call provider office for new concerns or questions  Patient to follow up with provider regarding Zoloft Patient's DPR to call and schedule CT Pelvis as ordered. Patient to reschedule dental appt and schedule PCP appt.  Follow Up Plan:  The patient has been provided with contact information for the care management team and has been advised to call with any health related questions or concerns.  The care management team will reach out to the patient again over the next 30 business  days.

## 2022-09-21 ENCOUNTER — Telehealth: Payer: Self-pay | Admitting: *Deleted

## 2022-09-21 NOTE — Telephone Encounter (Signed)
LMTRC  TCS, ASA 3 w/Dr.Carver, UDS prior

## 2022-09-22 ENCOUNTER — Other Ambulatory Visit: Payer: Medicaid Other

## 2022-09-22 NOTE — Patient Instructions (Signed)
Visit Information  [name] was given information about Medicaid Managed Care team care coordination services as a part of their [facility] Medicaid benefit. [name] verbally consented to engagement with the [facility] Managed Care team.   If you are experiencing a medical emergency, please call 911 or report to your local emergency department or urgent care.   If you have a non-emergency medical problem during routine business hours, please contact your provider's office and ask to speak with a nurse.   For questions related to your [facility] health plan, please call: [phone] or go here:[email or web address]  If you would like to schedule transportation through your [facility] plan, please call the following number at least 2 days in advance of your appointment: [phone].   You can also use the MTM portal or MTM mobile app to manage your rides. Reimbursement for transportation is available through [facility]! For the portal, please go to [email or web address]  Call the [facility] Crisis Line at [phone], at any time, 24 hours a day, 7 days a week. If you are in danger or need immediate medical attention call 911.  If you would like help to quit smoking, call 1-800-QUIT-NOW (986-423-1458) OR Espaol: 1-855-Djelo-Ya (6-433-295-1884) o para ms informacin haga clic aqu or Text READY to 166-063 to register via text  [name] - following are the goals we discussed in your visit today:   Goals Addressed   None       Social Worker will follow up in 30 days.   [name], [name], MHA [facility]  Managed Medicaid Social Worker [phone]   Following is a copy of your plan of care:  There are no care plans that you recently modified to display for this patient.

## 2022-09-22 NOTE — Patient Outreach (Signed)
Medicaid Managed Care Social Work Note  09/22/2022 Name:  [name] MRN:  [id number] DOB:  1965/06/17  [name] is an 57 y.o. year old male who is a primary patient of Clifford Laroche, [name].  The [facility] Managed Care Coordination team was consulted for assistance with:   dental resources  [name] was given information about Medicaid Managed Care Coordination team services today. [name] Patient agreed to services and verbal consent obtained.  Engaged with patient  for by telephone forinitial visit in response to referral for case management and/or care coordination services.   Assessments/Interventions:  Review of past medical history, allergies, medications, health status, including review of consultants reports, laboratory and other test data, was performed as part of comprehensive evaluation and provision of chronic care management services.  SDOH: (Social Determinant of Health) assessments and interventions performed: SDOH Interventions    Flowsheet Row Patient Outreach Telephone from 09/16/2022 in [place] POPULATION HEALTH DEPARTMENT Patient Outreach Telephone from 08/16/2022 in [place] POPULATION HEALTH DEPARTMENT Patient Outreach Telephone from 06/15/2022 in [place] POPULATION HEALTH DEPARTMENT Erroneous Encounter from 06/01/2022 in [name] Patient Outreach Telephone from 05/13/2022 in [place] POPULATION HEALTH DEPARTMENT Patient Outreach Telephone from 04/21/2022 in [place] POPULATION HEALTH DEPARTMENT  SDOH Interventions        Food Insecurity Interventions -- Intervention Not Indicated -- Intervention Not Indicated -- Intervention Not Indicated  Housing Interventions -- -- Intervention Not Indicated Intervention Not Indicated -- Intervention Not Indicated  Transportation Interventions -- -- -- Intervention Not Indicated Intervention Not Indicated --  Utilities Interventions -- -- -- Intervention Not Indicated Intervention Not  Indicated --  Alcohol Usage Interventions -- -- Intervention Not Indicated (Score <7) -- -- --  Financial Strain Interventions -- -- -- Intervention Not Indicated -- --  Physical Activity Interventions -- -- -- Intervention Not Indicated  [patient with right leg weakness and some left leg numbeness-residual effects from stroke] -- --  Stress Interventions -- -- -- Intervention Not Indicated -- --  Social Connections Interventions -- -- -- Intervention Not Indicated -- --  Health Literacy Interventions Intervention Not Indicated -- -- -- -- --     [name] completed a telephone outreach with patient, he states he needs some dental resources that will accept his Medicaid. [name] and patient agreed to mail patient resources for dental for [name], patient states he is okay with traveling. No other resources are needed at this time.  Advanced Directives Status:  Not addressed in this encounter.  Care Plan                 No Known Allergies  Medications Reviewed Today   Medications were not reviewed in this encounter     Patient Active Problem List   Diagnosis Date Noted   History of colonic polyps 09/13/2022   Unilateral inguinal hernia without obstruction or gangrene 09/13/2022   Erectile dysfunction 03/17/2022   Inguinal bulge 03/17/2022   Depression due to old stroke 02/13/2022   Encounter for general adult medical examination with abnormal findings 02/13/2022   Elevated LDL cholesterol level 02/13/2022   Primary hypertension 02/13/2022   Intermittent explosive disorder 11/22/2021   Protein-calorie malnutrition, severe 10/31/2021   Anoxic brain injury (HCC) 10/31/2021   Acute respiratory failure with hypoxia (HCC)    Cerebrovascular accident (CVA) (HCC)    Acute encephalopathy 10/22/2021   Fever    Pleural effusion    Deep venous thrombosis (HCC)    Pain and swelling of  left lower leg 12/28/2018   Septic arthritis of shoulder, right (HCC) 09/07/2018   Septic arthritis of  sacroiliac joint (HCC) 09/07/2018   Sternoclavicular joint pain, left 09/07/2018   Discitis of lumbosacral region 06/28/2018   Psoas abscess, right (HCC) 06/28/2018   Polysubstance abuse (HCC) 06/24/2018   Normocytic anemia 06/24/2018   Recurrent boils 06/24/2018   Head injury    Septic arthritis of knee, left (HCC)    Acute medial meniscal tear, left, initial encounter    GERD (gastroesophageal reflux disease) 06/22/2018   Mild protein-calorie malnutrition (HCC) 06/22/2018   AKI (acute kidney injury) (HCC) 06/21/2018   Spinal stenosis of lumbosacral region 06/21/2018   Chronic hepatitis C (HCC) 12/17/2013   Cigarette smoker 12/17/2013   Family history of diabetes mellitus (DM) 12/17/2013   Notalgia 12/17/2013    Conditions to be addressed/monitored per PCP order:   dental resources  There are no care plans that you recently modified to display for this patient.   Follow up:  Patient agrees to Care Plan and Follow-up.  Plan: The Managed Medicaid care management team will reach out to the patient again over the next 30 days.  Date/time of next scheduled Social Work care management/care coordination outreach:  10/26/22  [name], [name], [facility]  Managed [facility] Social Worker [phone]

## 2022-09-23 ENCOUNTER — Encounter (HOSPITAL_COMMUNITY)
Admission: RE | Admit: 2022-09-23 | Discharge: 2022-09-23 | Disposition: A | Discharge status: Home / Self Care | Payer: Medicaid Other | Source: Ambulatory Visit | Attending: Acute Care | Admitting: Acute Care

## 2022-09-23 DIAGNOSIS — R918 Other nonspecific abnormal finding of lung field: Secondary | ICD-10-CM | POA: Insufficient documentation

## 2022-09-23 DIAGNOSIS — F17200 Nicotine dependence, unspecified, uncomplicated: Secondary | ICD-10-CM | POA: Insufficient documentation

## 2022-09-28 ENCOUNTER — Ambulatory Visit: Payer: Medicaid Other | Admitting: Acute Care

## 2022-09-30 ENCOUNTER — Encounter (HOSPITAL_COMMUNITY)
Admission: RE | Admit: 2022-09-30 | Discharge: 2022-09-30 | Disposition: A | Discharge status: Home / Self Care | Payer: Medicaid Other | Source: Ambulatory Visit | Attending: Acute Care | Admitting: Acute Care

## 2022-09-30 DIAGNOSIS — F17200 Nicotine dependence, unspecified, uncomplicated: Secondary | ICD-10-CM | POA: Insufficient documentation

## 2022-09-30 DIAGNOSIS — R918 Other nonspecific abnormal finding of lung field: Secondary | ICD-10-CM | POA: Diagnosis not present

## 2022-09-30 DIAGNOSIS — R911 Solitary pulmonary nodule: Secondary | ICD-10-CM | POA: Diagnosis not present

## 2022-09-30 MED ORDER — FLUDEOXYGLUCOSE F - 18 (FDG) INJECTION
8.6600 | Freq: Once | INTRAVENOUS | Status: AC | PRN
  Administered 2022-09-30: 8.66 via INTRAVENOUS

## 2022-10-06 DIAGNOSIS — I10 Essential (primary) hypertension: Secondary | ICD-10-CM | POA: Diagnosis not present

## 2022-10-06 DIAGNOSIS — K219 Gastro-esophageal reflux disease without esophagitis: Secondary | ICD-10-CM | POA: Diagnosis not present

## 2022-10-06 DIAGNOSIS — J441 Chronic obstructive pulmonary disease with (acute) exacerbation: Secondary | ICD-10-CM | POA: Diagnosis not present

## 2022-10-06 DIAGNOSIS — N179 Acute kidney failure, unspecified: Secondary | ICD-10-CM | POA: Diagnosis not present

## 2022-10-06 DIAGNOSIS — J189 Pneumonia, unspecified organism: Secondary | ICD-10-CM | POA: Diagnosis not present

## 2022-10-06 DIAGNOSIS — Z87891 Personal history of nicotine dependence: Secondary | ICD-10-CM | POA: Diagnosis not present

## 2022-10-06 DIAGNOSIS — Z1152 Encounter for screening for COVID-19: Secondary | ICD-10-CM | POA: Diagnosis not present

## 2022-10-06 DIAGNOSIS — A419 Sepsis, unspecified organism: Secondary | ICD-10-CM | POA: Diagnosis not present

## 2022-10-07 DIAGNOSIS — J189 Pneumonia, unspecified organism: Secondary | ICD-10-CM | POA: Diagnosis not present

## 2022-10-10 DIAGNOSIS — Z419 Encounter for procedure for purposes other than remedying health state, unspecified: Secondary | ICD-10-CM | POA: Diagnosis not present

## 2022-10-15 ENCOUNTER — Encounter: Payer: Self-pay | Admitting: Acute Care

## 2022-10-15 ENCOUNTER — Telehealth (INDEPENDENT_AMBULATORY_CARE_PROVIDER_SITE_OTHER): Payer: Medicaid Other | Admitting: Acute Care

## 2022-10-15 ENCOUNTER — Other Ambulatory Visit: Payer: Self-pay

## 2022-10-15 DIAGNOSIS — R911 Solitary pulmonary nodule: Secondary | ICD-10-CM | POA: Diagnosis not present

## 2022-10-15 DIAGNOSIS — K4090 Unilateral inguinal hernia, without obstruction or gangrene, not specified as recurrent: Secondary | ICD-10-CM

## 2022-10-15 DIAGNOSIS — R918 Other nonspecific abnormal finding of lung field: Secondary | ICD-10-CM

## 2022-10-15 DIAGNOSIS — Z87891 Personal history of nicotine dependence: Secondary | ICD-10-CM

## 2022-10-15 DIAGNOSIS — F17200 Nicotine dependence, unspecified, uncomplicated: Secondary | ICD-10-CM

## 2022-10-15 NOTE — Progress Notes (Addendum)
Virtual Visit via Telephone Note  I connected with [name] on 10/15/22 at  8:30 AM EDT by telephone and verified that I am speaking with the correct person using two identifiers.  Location: Patient:  At home Provider: [address] [place], [place], [address]    I discussed the limitations, risks, security and privacy concerns of performing an evaluation and management service by telephone and the availability of in person appointments. I also discussed with the patient that there may be a patient responsible charge related to this service. The patient expressed understanding and agreed to proceed.   History of Present Illness: [name] is a 57 y.o. male some day smoker  with past medical history of primary hypertension, chronic hepatitis, and GERD. He is followed through the lung cancer screening program.  He had his baseline scan 04/2022. Lung-RADS 4A, suspicious. There was an 18.1 mm RUL macrolobulated nodule that had been there previously, but had shown growth. Follow up low-dose chest CT without contrast in 3 months was ordered. This showed trace enlargement in the 3 months, it was scored as a 4B. A PET scan was ordered for further evaluation. PET scan showed very low metabolic activity, radiology favors benign etiology. Plan is for a 3 month follow up LDCT to ensure the nodule remains stable.He is in agreement with this plan.  There was notation of an incarcerated fat hernia. He has an appointment to have this evaluated 10/19/2022.    Observations/Objective: 10/07/2022 PET Very low metabolic activity associated with the RIGHT upper lobe pulmonary nodule. Favor benign etiology. 2. No evidence of metastatic adenopathy in the chest. 3. No evidence distant metastatic disease. 4. Inflammation in the RIGHT lower quadrant peritoneal fat appears to be associated with a RIGHT inguinal hernia which is fat filled. Findings are most consistent with incarcerated fat hernia  with omental infarct. Consider follow-up CT to demonstrate resolution. 5.  Aortic Atherosclerosis (ICD10-I70.0).  08/2022 LDCT Lungs/Pleura: Centrilobular emphysema. 18.5 mm central right upper lobe nodule (5/99), previously 18.1 mm. 8.0 mm subpleural anterolateral left lower lobe nodule (5/253), increased in size from 7.1 mm. Scattered bibasilar scarring. No pleural fluid. Airway is unremarkable.   LDCT 04/2022 A central right upper lobe macrolobulated pulmonary nodule measures volume derived equivalent diameter 18.1 mm including on 103/4. This was present on but has significantly enlarged from approximately 1.1 cm on 09/05/2018.  Assessment and Plan: Right upper lobe macrolobulated pulmonary nodule Some day smoker with a 60 pack year smoking history Appears benign on recent PET Plan Follow up low dose Ct Chest due end of November 2024. Please work on quitting smoking.  Follow up as scheduled 9/10 regarding the incarcerated fat hernia. If pain gets worse, seek emergency care  Follow Up Instructions: Follow up LDCT in 3 months You will get a call to schedule closer to the time Please work on quitting smoking.  Follow up as scheduled 9/10 regarding the incarcerated fat hernia. If pain gets worse, seek emergency care  I discussed the assessment and treatment plan with the patient. The patient was provided an opportunity to ask questions and all were answered. The patient agreed with the plan and demonstrated an understanding of the instructions.   The patient was advised to call back or seek an in-person evaluation if the symptoms worsen or if the condition fails to improve as anticipated.  I provided 15 minutes of non-face-to-face time during this encounter. I spend 3 minutes counseling on smoking cessation.  Raejean Bullock, NP  10/15/2022  

## 2022-10-15 NOTE — Patient Instructions (Addendum)
It was good to talk with you today The lung nodule of concern looks benign on PET scan, as we discussed. This is good news. Follow up LDCT in 3 months to ensure this is stable You will get a call to schedule closer to the time It will be due 12/2022 Please work on quitting smoking.  Call 1-800-QUIT NOW for free nicotine patches gum or mints Follow up as scheduled 9/10 regarding the incarcerated fat hernia. If pain gets worse, seek emergency care Please contact office for sooner follow up if symptoms do not improve or worsen or seek emergency care

## 2022-10-19 ENCOUNTER — Ambulatory Visit: Payer: Medicaid Other | Admitting: Surgery

## 2022-10-19 ENCOUNTER — Other Ambulatory Visit: Payer: Medicaid Other | Admitting: Obstetrics and Gynecology

## 2022-10-19 NOTE — Patient Instructions (Signed)
Hi Mr. Schetter, I am sorry I missed you this morning, I hope you are doing okay- as a part of your Medicaid benefit, you are eligible for care management and care coordination services at no cost or copay. I was unable to reach you by phone today but would be happy to help you with your health related needs. Please feel free to call me at [phone].  A member of the Managed Medicaid care management team will reach out to you again over the next 30 business  days.   [name] RN, BSN [place]  [employer] - Managed Medicaid High Risk [phone]

## 2022-10-19 NOTE — Patient Outreach (Signed)
  Medicaid Managed Care   Unsuccessful Attempt Note   10/19/2022 Name: [name] MRN: [id number] DOB: September 18, 1965  Referred by: [name], [name] Reason for referral : High Risk Managed Medicaid (Unsuccessful telephone outreach)  An unsuccessful telephone outreach was attempted today. The patient was referred to the case management team for assistance with care management and care coordination.    Follow Up Plan: The Managed Medicaid care management team will reach out to the patient again over the next 30 business  days. and The  Patient has been provided with contact information for the Managed Medicaid care management team and has been advised to call with any health related questions or concerns.   [name] RN, BSN [place]  [employer] - Managed Medicaid High Risk [phone]

## 2022-10-26 ENCOUNTER — Ambulatory Visit: Payer: Medicaid Other | Admitting: Obstetrics and Gynecology

## 2022-10-26 ENCOUNTER — Other Ambulatory Visit: Payer: Medicaid Other

## 2022-10-26 NOTE — Patient Instructions (Signed)
Visit Information  Mr. Damuth was given information about Medicaid Managed Care team care coordination services as a part of their [facility] Medicaid benefit. Lancer Kindley verbally consented to engagement with the [facility] Managed Care team.   If you are experiencing a medical emergency, please call 911 or report to your local emergency department or urgent care.   If you have a non-emergency medical problem during routine business hours, please contact your provider's office and ask to speak with a nurse.   For questions related to your [facility] health plan, please call: [phone] or go here:[email or web address]  If you would like to schedule transportation through your [facility] plan, please call the following number at least 2 days in advance of your appointment: [phone].   You can also use the MTM portal or MTM mobile app to manage your rides. Reimbursement for transportation is available through [employer]! For the portal, please go to [email or web address]  Call the [facility] Crisis Line at [phone], at any time, 24 hours a day, 7 days a week. If you are in danger or need immediate medical attention call 911.  If you would like help to quit smoking, call 1-800-QUIT-NOW (478 454 5167) OR Espaol: 1-855-Djelo-Ya (4-132-440-1027) o para ms informacin haga clic aqu or Text READY to 253-664 to register via text  Mr. Inga - following are the goals we discussed in your visit today:   Goals Addressed   None      The  Patient                                              has been provided with contact information for the Managed Medicaid care management team and has been advised to call with any health related questions or concerns.   [name], [name], MHA [facility]  Managed Medicaid Social Worker [phone]   Following is a copy of your plan of care:  There are no care plans that you recently modified to display for this  patient.

## 2022-10-26 NOTE — Patient Outreach (Signed)
Medicaid Managed Care Social Work Note  10/26/2022 Name:  [name] MRN:  [id number] DOB:  [birth year]  [name] is an 57 y.o. year old male who is a primary patient of [name], [name].  The [facility] Managed Care Coordination team was consulted for assistance with:   dental resources  [name] was given information about Medicaid Managed Care Coordination team services today. [name] Patient agreed to services and verbal consent obtained.  Engaged with patient  for by telephone forfollow up visit in response to referral for case management and/or care coordination services.   Assessments/Interventions:  Review of past medical history, allergies, medications, health status, including review of consultants reports, laboratory and other test data, was performed as part of comprehensive evaluation and provision of chronic care management services.  SDOH: (Social Determinant of Health) assessments and interventions performed: SDOH Interventions    Flowsheet Row Patient Outreach Telephone from 09/16/2022 in [place] POPULATION HEALTH DEPARTMENT Patient Outreach Telephone from 08/16/2022 in [place] POPULATION HEALTH DEPARTMENT Patient Outreach Telephone from 06/15/2022 in [place] POPULATION HEALTH DEPARTMENT Erroneous Encounter from 06/01/2022 in [facility] [place] [facility] Patient Outreach Telephone from 05/13/2022 in [place] POPULATION HEALTH DEPARTMENT Patient Outreach Telephone from 04/21/2022 in [place] POPULATION HEALTH DEPARTMENT  SDOH Interventions        Food Insecurity Interventions -- Intervention Not Indicated -- Intervention Not Indicated -- Intervention Not Indicated  Housing Interventions -- -- Intervention Not Indicated Intervention Not Indicated -- Intervention Not Indicated  Transportation Interventions -- -- -- Intervention Not Indicated Intervention Not Indicated --  Utilities Interventions -- -- -- Intervention Not Indicated Intervention Not  Indicated --  Alcohol Usage Interventions -- -- Intervention Not Indicated (Score <7) -- -- --  Financial Strain Interventions -- -- -- Intervention Not Indicated -- --  Physical Activity Interventions -- -- -- Intervention Not Indicated  [patient with right leg weakness and some left leg numbeness-residual effects from stroke] -- --  Stress Interventions -- -- -- Intervention Not Indicated -- --  Social Connections Interventions -- -- -- Intervention Not Indicated -- --  Health Literacy Interventions Intervention Not Indicated -- -- -- -- --     [name] completed a telephone outreach with patient, he did receive the resources sent to him, and he was able to schedule a dental appointment on 11/08/22. Patient was very appreciative of resources sent. No other resources are needed at this time. [name] provided contact information for any future needs.   Advanced Directives Status:  Not addressed in this encounter.  Care Plan                 No Known Allergies  Medications Reviewed Today   Medications were not reviewed in this encounter     Patient Active Problem List   Diagnosis Date Noted   History of colonic polyps 09/13/2022   Unilateral inguinal hernia without obstruction or gangrene 09/13/2022   Erectile dysfunction 03/17/2022   Inguinal bulge 03/17/2022   Depression due to old stroke 02/13/2022   Encounter for general adult medical examination with abnormal findings 02/13/2022   Elevated LDL cholesterol level 02/13/2022   Primary hypertension 02/13/2022   Intermittent explosive disorder 11/22/2021   Protein-calorie malnutrition, severe 10/31/2021   Anoxic brain injury (HCC) 10/31/2021   Acute respiratory failure with hypoxia (HCC)    Cerebrovascular accident (CVA) (HCC)    Acute encephalopathy 10/22/2021   Fever    Pleural effusion    Deep venous thrombosis (HCC)  Pain and swelling of left lower leg 12/28/2018   Septic arthritis of shoulder, right (HCC) 09/07/2018   Septic  arthritis of sacroiliac joint (HCC) 09/07/2018   Sternoclavicular joint pain, left 09/07/2018   Discitis of lumbosacral region 06/28/2018   Psoas abscess, right (HCC) 06/28/2018   Polysubstance abuse (HCC) 06/24/2018   Normocytic anemia 06/24/2018   Recurrent boils 06/24/2018   Head injury    Septic arthritis of knee, left (HCC)    Acute medial meniscal tear, left, initial encounter    GERD (gastroesophageal reflux disease) 06/22/2018   Mild protein-calorie malnutrition (HCC) 06/22/2018   AKI (acute kidney injury) (HCC) 06/21/2018   Spinal stenosis of lumbosacral region 06/21/2018   Chronic hepatitis C (HCC) 12/17/2013   Cigarette smoker 12/17/2013   Family history of diabetes mellitus (DM) 12/17/2013   Notalgia 12/17/2013    Conditions to be addressed/monitored per PCP order:   dental resources  There are no care plans that you recently modified to display for this patient.   Follow up:  Patient agrees to Care Plan and Follow-up.  Plan: The  Patient has been provided with contact information for the Managed Medicaid care management team and has been advised to call with any health related questions or concerns.    Abelino Derrick, MHA [facility]  Managed [facility] Social Worker [phone]

## 2022-11-04 ENCOUNTER — Ambulatory Visit: Payer: Medicaid Other | Admitting: Family Medicine

## 2022-11-09 DIAGNOSIS — Z419 Encounter for procedure for purposes other than remedying health state, unspecified: Secondary | ICD-10-CM | POA: Diagnosis not present

## 2022-11-10 ENCOUNTER — Other Ambulatory Visit: Payer: Self-pay

## 2022-11-10 ENCOUNTER — Encounter (HOSPITAL_COMMUNITY): Payer: Self-pay | Admitting: *Deleted

## 2022-11-10 ENCOUNTER — Emergency Department (EMERGENCY_DEPARTMENT_HOSPITAL)
Admission: EM | Admit: 2022-11-10 | Discharge: 2022-11-11 | Disposition: A | Discharge status: Home / Self Care | Payer: Medicaid Other | Source: Home / Self Care | Attending: Emergency Medicine | Admitting: Emergency Medicine

## 2022-11-10 DIAGNOSIS — R45851 Suicidal ideations: Secondary | ICD-10-CM

## 2022-11-10 DIAGNOSIS — T461X2A Poisoning by calcium-channel blockers, intentional self-harm, initial encounter: Secondary | ICD-10-CM | POA: Insufficient documentation

## 2022-11-10 DIAGNOSIS — Z79899 Other long term (current) drug therapy: Secondary | ICD-10-CM | POA: Insufficient documentation

## 2022-11-10 DIAGNOSIS — Z8673 Personal history of transient ischemic attack (TIA), and cerebral infarction without residual deficits: Secondary | ICD-10-CM | POA: Insufficient documentation

## 2022-11-10 DIAGNOSIS — F332 Major depressive disorder, recurrent severe without psychotic features: Secondary | ICD-10-CM

## 2022-11-10 DIAGNOSIS — F1010 Alcohol abuse, uncomplicated: Secondary | ICD-10-CM

## 2022-11-10 DIAGNOSIS — F1420 Cocaine dependence, uncomplicated: Secondary | ICD-10-CM

## 2022-11-10 DIAGNOSIS — F1590 Other stimulant use, unspecified, uncomplicated: Secondary | ICD-10-CM

## 2022-11-10 DIAGNOSIS — I10 Essential (primary) hypertension: Secondary | ICD-10-CM | POA: Insufficient documentation

## 2022-11-10 LAB — CBC WITH DIFFERENTIAL/PLATELET
Abs Immature Granulocytes: 0.06 10*3/uL (ref 0.00–0.07)
Basophils Absolute: 0.1 10*3/uL (ref 0.0–0.1)
Basophils Relative: 1 %
Eosinophils Absolute: 0.4 10*3/uL (ref 0.0–0.5)
Eosinophils Relative: 4 %
HCT: 47.1 % (ref 39.0–52.0)
Hemoglobin: 15.3 g/dL (ref 13.0–17.0)
Immature Granulocytes: 1 %
Lymphocytes Relative: 16 %
Lymphs Abs: 1.5 10*3/uL (ref 0.7–4.0)
MCH: 30.4 pg (ref 26.0–34.0)
MCHC: 32.5 g/dL (ref 30.0–36.0)
MCV: 93.6 fL (ref 80.0–100.0)
Monocytes Absolute: 0.5 10*3/uL (ref 0.1–1.0)
Monocytes Relative: 5 %
Neutro Abs: 6.9 10*3/uL (ref 1.7–7.7)
Neutrophils Relative %: 73 %
Platelets: 341 10*3/uL (ref 150–400)
RBC: 5.03 MIL/uL (ref 4.22–5.81)
RDW: 13.8 % (ref 11.5–15.5)
WBC: 9.4 10*3/uL (ref 4.0–10.5)
nRBC: 0 % (ref 0.0–0.2)

## 2022-11-10 LAB — COMPREHENSIVE METABOLIC PANEL
ALT: 34 U/L (ref 0–44)
AST: 22 U/L (ref 15–41)
Albumin: 4 g/dL (ref 3.5–5.0)
Alkaline Phosphatase: 109 U/L (ref 38–126)
Anion gap: 9 (ref 5–15)
BUN: 16 mg/dL (ref 6–20)
CO2: 25 mmol/L (ref 22–32)
Calcium: 9.3 mg/dL (ref 8.9–10.3)
Chloride: 103 mmol/L (ref 98–111)
Creatinine, Ser: 1.04 mg/dL (ref 0.61–1.24)
GFR, Estimated: >60 mL/min (ref >60)
Glucose, Bld: 124 mg/dL — ABNORMAL HIGH (ref 70–99)
Potassium: 3 mmol/L — ABNORMAL LOW (ref 3.5–5.1)
Sodium: 137 mmol/L (ref 135–145)
Total Bilirubin: 0.5 mg/dL (ref 0.3–1.2)
Total Protein: 7.4 g/dL (ref 6.5–8.1)

## 2022-11-10 LAB — RAPID URINE DRUG SCREEN, HOSP PERFORMED
Amphetamines: NOT DETECTED
Barbiturates: NOT DETECTED
Benzodiazepines: NOT DETECTED
Cocaine: POSITIVE — AB
Opiates: NOT DETECTED
Tetrahydrocannabinol: POSITIVE — AB

## 2022-11-10 LAB — ACETAMINOPHEN LEVEL
Acetaminophen (Tylenol), Serum: <10 ug/mL — ABNORMAL LOW (ref 10–30)
Acetaminophen (Tylenol), Serum: <10 ug/mL — ABNORMAL LOW (ref 10–30)

## 2022-11-10 LAB — MAGNESIUM: Magnesium: 2.2 mg/dL (ref 1.7–2.4)

## 2022-11-10 LAB — ETHANOL: Alcohol, Ethyl (B): <10 mg/dL (ref <10)

## 2022-11-10 LAB — SALICYLATE LEVEL: Salicylate Lvl: <7 mg/dL — ABNORMAL LOW (ref 7.0–30.0)

## 2022-11-10 MED ORDER — POTASSIUM CHLORIDE 10 MEQ/100ML IV SOLN
10.0000 meq | Freq: Once | INTRAVENOUS | Status: AC
  Administered 2022-11-10: 10 meq via INTRAVENOUS
  Filled 2022-11-10: qty 100

## 2022-11-10 NOTE — ED Notes (Addendum)
Daughter, Nakoa Ganus, is concerned that pt will say and manipulate the situation to not get help or IVC. Daughter states pt has been SI before but not to this extent. Daughter is concerned and afraid that pt will actually act out on impulse and his thoughts since his behavior has negatively progressed.   Daughter, Quentin Shorey, would like to be contacted by [facility] before or after pt is TTS

## 2022-11-10 NOTE — ED Provider Notes (Addendum)
[facility] AT [facility] Provider Note   CSN: [id number] Arrival date & time: 11/10/22  1343     History  Chief Complaint  Patient presents with   V70.1    [name] is a 57 y.o. male.  HPI Patient presents after overdose.  Reportedly overdosed on around 9 of his 10 mg Norvasc's about an hour prior to getting here.  Presented from [facility].  States he did it because he does not want to live anymore.  History of depression.  Since stroke studies been doing worse.  Does drink alcohol.  Also admits to some other drug use.   Past Medical History:  Diagnosis Date   Acid reflux    Emphysema of lung (HCC)    Head injury    Hepatitis C    S/p treatment with Epclusa in 2020   Hiatal hernia    HTN (hypertension)    Pulmonary nodules    Stroke [facility])     Home Medications Prior to Admission medications   Medication Sig Start Date End Date Taking? Authorizing Provider  azithromycin (ZITHROMAX) 250 MG tablet Take 250 mg by mouth daily. 10/07/22  Yes [provider]  amLODipine (NORVASC) 10 MG tablet Take 1 tablet (10 mg total) by mouth daily. 08/18/22   [name], [name]  cefdinir (OMNICEF) 300 MG capsule Take 300 mg by mouth 2 (two) times daily. 10/07/22   [provider]  ipratropium-albuterol (DUONEB) 0.5-2.5 (3) MG/3ML SOLN Take 3 mLs by nebulization every 4 (four) hours as needed (wheezing, shortness of breath). 10/07/22   [provider]  olmesartan (BENICAR) 20 MG tablet Take 1 tablet (20 mg total) by mouth daily. 08/18/22   [name], [name]  pantoprazole (PROTONIX) 40 MG tablet Take 1 tablet (40 mg total) by mouth daily at 12 noon. 08/18/22   [name], [name]  predniSONE (STERAPRED UNI-PAK 21 TAB) 5 MG (21) TBPK tablet Take 5 mg by mouth as directed. 10/07/22   [provider]  VENTOLIN HFA 108 (90 Base) MCG/ACT inhaler Inhale 2 puffs into the lungs every 6 (six) hours as needed for wheezing or shortness of  breath. 10/07/22   [provider]      Allergies    Patient has no known allergies.    Review of Systems   Review of Systems  Physical Exam Updated Vital Signs BP 115/82 (BP Location: Right Arm)   Pulse 95   Temp 98.5 F (36.9 C) (Oral)   Resp 18   Ht 5\' 10"  (1.778 m)   Wt 86.2 kg   SpO2 95%   BMI 27.26 kg/m  Physical Exam Vitals and nursing note reviewed.  Cardiovascular:     Rate and Rhythm: Normal rate.  Pulmonary:     Breath sounds: No wheezing.  Abdominal:     Tenderness: There is no abdominal tenderness.  Skin:    General: Skin is warm.  Neurological:     Mental Status: He is alert and oriented to person, place, and time.     ED Results / Procedures / Treatments   Labs (all labs ordered are listed, but only abnormal results are displayed) Labs Reviewed  COMPREHENSIVE METABOLIC PANEL - Abnormal; Notable for the following components:      Result Value   Potassium 3.0 (*)    Glucose, Bld 124 (*)    All other components within normal limits  ACETAMINOPHEN LEVEL - Abnormal; Notable for the following components:   Acetaminophen (Tylenol), Serum <  10 (*)    All other components within normal limits  SALICYLATE LEVEL - Abnormal; Notable for the following components:   Salicylate Lvl <7.0 (*)    All other components within normal limits  ETHANOL  CBC WITH DIFFERENTIAL/PLATELET  MAGNESIUM  RAPID URINE DRUG SCREEN, HOSP PERFORMED    EKG None  Radiology No results found.  Procedures Procedures    Medications Ordered in ED Medications - No data to display  ED Course/ Medical Decision Making/ A&P                                 Medical Decision Making Amount and/or Complexity of Data Reviewed Labs: ordered.  Risk Prescription drug management.   Patient presents after overdose.  Suicide attempt.  Reports taking 9 of his 10 mg Norvasc tablets.  States he took it he thought his blood pressure was high.  States it was a suicide attempt.   Does want treatment however.  Discussed with poison control.  They recommend 14 hours of monitoring.  Also recommend a 4-hour Tylenol level at 5:00.  Norvasc can peak between 30 minutes and 12 hours.  Also may have coingestions.  Does drink alcohol do some drugs but states he has been trying not to do that.  Patient I believe is voluntary at this time but would IVC if patient attempts to leave.  CRITICAL CARE Performed by: Benjiman Core Total critical care time: 30 minutes Critical care time was exclusive of separately billable procedures and treating other patients. Critical care was necessary to treat or prevent imminent or life-threatening deterioration. Critical care was time spent personally by me on the following activities: development of treatment plan with patient and/or surrogate as well as nursing, discussions with consultants, evaluation of patient's response to treatment, examination of patient, obtaining history from patient or surrogate, ordering and performing treatments and interventions, ordering and review of laboratory studies, ordering and review of radiographic studies, pulse oximetry and re-evaluation of patient's condition.  Care turned over to Dr. Jeraldine Loots  Mild hypokalemia.  Poison control requested supplementing to high normal.  Will give IV at this time.         Final Clinical Impression(s) / ED Diagnoses Final diagnoses:  None    Rx / DC Orders ED Discharge Orders     None         Benjiman Core, MD 11/10/22 1513    Benjiman Core, MD 11/10/22 1526

## 2022-11-10 NOTE — ED Notes (Signed)
Security wanded pt ?

## 2022-11-10 NOTE — ED Triage Notes (Signed)
Pt brought in with LEO for SI from [facility].  Pt states he took 4 10mg  Norvasc few minutes ago per pt. Pt states he has a plan but states " I can't tell you my plan" pt admits to ETOH and drug use.

## 2022-11-10 NOTE — ED Notes (Signed)
Poison control called pt instructed to stay for 14 hours per EDP.

## 2022-11-10 NOTE — BH Assessment (Signed)
@   1845, this consultation has been deferred to the IRIS team. The IRIS Care Coordinator will schedule a time for patient to be assessed by the IRIS provider and communicate these details to the Emergency Department care team. IRIS contact # [phone].

## 2022-11-10 NOTE — ED Notes (Addendum)
Pt lying in bed with sitter at bedside. Pt stated, I don;t want to do this anymore. I took 9 of my blood pressure pills.  Pt is alert and calm. No apparent signs of distress. VS WNL.

## 2022-11-11 ENCOUNTER — Inpatient Hospital Stay (HOSPITAL_COMMUNITY)
Admission: AD | Admit: 2022-11-11 | Discharge: 2022-11-22 | DRG: 885 | Disposition: A | Discharge status: Home / Self Care | Payer: Medicaid Other | Source: Intra-hospital | Attending: Psychiatry | Admitting: Psychiatry

## 2022-11-11 ENCOUNTER — Other Ambulatory Visit: Payer: Medicaid Other | Admitting: Obstetrics and Gynecology

## 2022-11-11 ENCOUNTER — Encounter (HOSPITAL_COMMUNITY): Payer: Self-pay | Admitting: Psychiatry

## 2022-11-11 DIAGNOSIS — F1010 Alcohol abuse, uncomplicated: Secondary | ICD-10-CM

## 2022-11-11 DIAGNOSIS — I10 Essential (primary) hypertension: Secondary | ICD-10-CM | POA: Diagnosis present

## 2022-11-11 DIAGNOSIS — Z5900 Homelessness unspecified: Secondary | ICD-10-CM | POA: Diagnosis not present

## 2022-11-11 DIAGNOSIS — F1420 Cocaine dependence, uncomplicated: Secondary | ICD-10-CM | POA: Diagnosis not present

## 2022-11-11 DIAGNOSIS — R251 Tremor, unspecified: Secondary | ICD-10-CM | POA: Diagnosis not present

## 2022-11-11 DIAGNOSIS — F17210 Nicotine dependence, cigarettes, uncomplicated: Secondary | ICD-10-CM | POA: Diagnosis present

## 2022-11-11 DIAGNOSIS — Z8619 Personal history of other infectious and parasitic diseases: Secondary | ICD-10-CM | POA: Diagnosis not present

## 2022-11-11 DIAGNOSIS — R45851 Suicidal ideations: Secondary | ICD-10-CM | POA: Diagnosis not present

## 2022-11-11 DIAGNOSIS — F411 Generalized anxiety disorder: Secondary | ICD-10-CM | POA: Diagnosis present

## 2022-11-11 DIAGNOSIS — K4090 Unilateral inguinal hernia, without obstruction or gangrene, not specified as recurrent: Secondary | ICD-10-CM | POA: Diagnosis present

## 2022-11-11 DIAGNOSIS — Z8249 Family history of ischemic heart disease and other diseases of the circulatory system: Secondary | ICD-10-CM | POA: Diagnosis not present

## 2022-11-11 DIAGNOSIS — E876 Hypokalemia: Secondary | ICD-10-CM | POA: Diagnosis not present

## 2022-11-11 DIAGNOSIS — F1410 Cocaine abuse, uncomplicated: Secondary | ICD-10-CM

## 2022-11-11 DIAGNOSIS — Z634 Disappearance and death of family member: Secondary | ICD-10-CM

## 2022-11-11 DIAGNOSIS — Z823 Family history of stroke: Secondary | ICD-10-CM | POA: Diagnosis not present

## 2022-11-11 DIAGNOSIS — F333 Major depressive disorder, recurrent, severe with psychotic symptoms: Secondary | ICD-10-CM | POA: Diagnosis present

## 2022-11-11 DIAGNOSIS — F332 Major depressive disorder, recurrent severe without psychotic features: Secondary | ICD-10-CM

## 2022-11-11 DIAGNOSIS — J439 Emphysema, unspecified: Secondary | ICD-10-CM | POA: Diagnosis present

## 2022-11-11 DIAGNOSIS — T461X2A Poisoning by calcium-channel blockers, intentional self-harm, initial encounter: Secondary | ICD-10-CM | POA: Diagnosis not present

## 2022-11-11 DIAGNOSIS — Z8673 Personal history of transient ischemic attack (TIA), and cerebral infarction without residual deficits: Secondary | ICD-10-CM | POA: Diagnosis not present

## 2022-11-11 DIAGNOSIS — F10239 Alcohol dependence with withdrawal, unspecified: Secondary | ICD-10-CM | POA: Diagnosis present

## 2022-11-11 DIAGNOSIS — K219 Gastro-esophageal reflux disease without esophagitis: Secondary | ICD-10-CM | POA: Diagnosis present

## 2022-11-11 DIAGNOSIS — K5900 Constipation, unspecified: Secondary | ICD-10-CM | POA: Diagnosis not present

## 2022-11-11 DIAGNOSIS — Z79899 Other long term (current) drug therapy: Secondary | ICD-10-CM

## 2022-11-11 DIAGNOSIS — F1090 Alcohol use, unspecified, uncomplicated: Secondary | ICD-10-CM

## 2022-11-11 DIAGNOSIS — F1590 Other stimulant use, unspecified, uncomplicated: Secondary | ICD-10-CM

## 2022-11-11 DIAGNOSIS — Z9151 Personal history of suicidal behavior: Secondary | ICD-10-CM | POA: Diagnosis not present

## 2022-11-11 DIAGNOSIS — K4091 Unilateral inguinal hernia, without obstruction or gangrene, recurrent: Secondary | ICD-10-CM | POA: Diagnosis not present

## 2022-11-11 DIAGNOSIS — R440 Auditory hallucinations: Secondary | ICD-10-CM | POA: Diagnosis not present

## 2022-11-11 DIAGNOSIS — Z818 Family history of other mental and behavioral disorders: Secondary | ICD-10-CM

## 2022-11-11 DIAGNOSIS — M79645 Pain in left finger(s): Secondary | ICD-10-CM | POA: Diagnosis present

## 2022-11-11 DIAGNOSIS — R519 Headache, unspecified: Secondary | ICD-10-CM | POA: Diagnosis not present

## 2022-11-11 DIAGNOSIS — M7989 Other specified soft tissue disorders: Secondary | ICD-10-CM | POA: Diagnosis present

## 2022-11-11 DIAGNOSIS — F1020 Alcohol dependence, uncomplicated: Secondary | ICD-10-CM | POA: Diagnosis present

## 2022-11-11 HISTORY — DX: Suicidal ideations: R45.851

## 2022-11-11 HISTORY — DX: Alcohol abuse, uncomplicated: F10.10

## 2022-11-11 HISTORY — DX: Alcohol use, unspecified, uncomplicated: F10.90

## 2022-11-11 HISTORY — DX: Other stimulant use, unspecified, uncomplicated: F15.90

## 2022-11-11 MED ORDER — FOLIC ACID 1 MG PO TABS
1.0000 mg | ORAL_TABLET | Freq: Every day | ORAL | Status: DC
  Administered 2022-11-11: 1 mg via ORAL
  Filled 2022-11-11: qty 1

## 2022-11-11 MED ORDER — VITAMIN B-1 100 MG PO TABS
100.0000 mg | ORAL_TABLET | Freq: Every day | ORAL | Status: DC
  Administered 2022-11-12 – 2022-11-22 (×11): 100 mg via ORAL
  Filled 2022-11-11 (×13): qty 1

## 2022-11-11 MED ORDER — MAGNESIUM HYDROXIDE 400 MG/5ML PO SUSP
15.0000 mL | Freq: Every day | ORAL | Status: DC | PRN
  Administered 2022-11-13 – 2022-11-16 (×2): 15 mL via ORAL
  Filled 2022-11-11 (×2): qty 30

## 2022-11-11 MED ORDER — LORAZEPAM 1 MG PO TABS
2.0000 mg | ORAL_TABLET | Freq: Three times a day (TID) | ORAL | Status: DC | PRN
  Administered 2022-11-21: 2 mg via ORAL
  Filled 2022-11-11: qty 2

## 2022-11-11 MED ORDER — LOPERAMIDE HCL 2 MG PO CAPS: 2.0000 mg | ORAL_CAPSULE | ORAL | Status: AC | PRN

## 2022-11-11 MED ORDER — FLUOXETINE HCL 10 MG PO CAPS
10.0000 mg | ORAL_CAPSULE | Freq: Every day | ORAL | Status: AC
  Administered 2022-11-11 – 2022-11-12 (×2): 10 mg via ORAL
  Filled 2022-11-11 (×3): qty 1

## 2022-11-11 MED ORDER — LORAZEPAM 1 MG PO TABS: 1.0000 mg | ORAL_TABLET | Freq: Every day | ORAL | Status: DC

## 2022-11-11 MED ORDER — LORAZEPAM 1 MG PO TABS
1.0000 mg | ORAL_TABLET | ORAL | Status: DC | PRN
  Administered 2022-11-11: 1 mg via ORAL
  Filled 2022-11-11: qty 1

## 2022-11-11 MED ORDER — OLANZAPINE 5 MG PO TBDP: 5.0000 mg | ORAL_TABLET | Freq: Three times a day (TID) | ORAL | Status: DC | PRN

## 2022-11-11 MED ORDER — ACETAMINOPHEN 325 MG PO TABS
650.0000 mg | ORAL_TABLET | Freq: Four times a day (QID) | ORAL | Status: DC | PRN
  Administered 2022-11-11 – 2022-11-22 (×16): 650 mg via ORAL
  Filled 2022-11-11 (×18): qty 2

## 2022-11-11 MED ORDER — ONDANSETRON 4 MG PO TBDP
4.0000 mg | ORAL_TABLET | Freq: Four times a day (QID) | ORAL | Status: AC | PRN
  Administered 2022-11-13: 4 mg via ORAL
  Filled 2022-11-11: qty 1

## 2022-11-11 MED ORDER — TRAZODONE HCL 50 MG PO TABS
50.0000 mg | ORAL_TABLET | Freq: Every evening | ORAL | Status: DC | PRN
  Administered 2022-11-11 – 2022-11-13 (×3): 50 mg via ORAL
  Filled 2022-11-11 (×3): qty 1

## 2022-11-11 MED ORDER — FLUOXETINE HCL 20 MG PO CAPS
20.0000 mg | ORAL_CAPSULE | Freq: Every day | ORAL | Status: DC
  Administered 2022-11-13 – 2022-11-20 (×8): 20 mg via ORAL
  Filled 2022-11-11 (×10): qty 1

## 2022-11-11 MED ORDER — ALUM & MAG HYDROXIDE-SIMETH 200-200-20 MG/5ML PO SUSP
15.0000 mL | ORAL | Status: DC | PRN
  Administered 2022-11-11: 15 mL via ORAL
  Filled 2022-11-11 (×2): qty 30

## 2022-11-11 MED ORDER — LORAZEPAM 1 MG PO TABS
1.0000 mg | ORAL_TABLET | Freq: Three times a day (TID) | ORAL | Status: AC
  Administered 2022-11-12 (×3): 1 mg via ORAL
  Filled 2022-11-11 (×3): qty 1

## 2022-11-11 MED ORDER — THIAMINE HCL 100 MG/ML IJ SOLN: 100.0000 mg | Freq: Every day | INTRAMUSCULAR | Status: DC

## 2022-11-11 MED ORDER — AMLODIPINE BESYLATE 10 MG PO TABS
10.0000 mg | ORAL_TABLET | Freq: Every day | ORAL | Status: DC
  Administered 2022-11-12 – 2022-11-22 (×11): 10 mg via ORAL
  Filled 2022-11-11 (×13): qty 1

## 2022-11-11 MED ORDER — LORAZEPAM 1 MG PO TABS
1.0000 mg | ORAL_TABLET | Freq: Four times a day (QID) | ORAL | Status: AC
  Administered 2022-11-11 (×3): 1 mg via ORAL
  Filled 2022-11-11 (×3): qty 1

## 2022-11-11 MED ORDER — GABAPENTIN 100 MG PO CAPS
100.0000 mg | ORAL_CAPSULE | Freq: Three times a day (TID) | ORAL | Status: DC
  Administered 2022-11-11 – 2022-11-13 (×6): 100 mg via ORAL
  Filled 2022-11-11 (×15): qty 1

## 2022-11-11 MED ORDER — LORAZEPAM 1 MG PO TABS
1.0000 mg | ORAL_TABLET | Freq: Four times a day (QID) | ORAL | Status: AC | PRN
  Administered 2022-11-13: 1 mg via ORAL
  Filled 2022-11-11: qty 1

## 2022-11-11 MED ORDER — ADULT MULTIVITAMIN W/MINERALS CH
1.0000 | ORAL_TABLET | Freq: Every day | ORAL | Status: DC
  Administered 2022-11-12 – 2022-11-22 (×11): 1 via ORAL
  Filled 2022-11-11 (×14): qty 1

## 2022-11-11 MED ORDER — LORAZEPAM 2 MG/ML IJ SOLN: 2.0000 mg | Freq: Three times a day (TID) | INTRAMUSCULAR | Status: DC | PRN

## 2022-11-11 MED ORDER — HYDROXYZINE HCL 25 MG PO TABS
25.0000 mg | ORAL_TABLET | Freq: Four times a day (QID) | ORAL | Status: DC | PRN
  Administered 2022-11-14: 25 mg via ORAL
  Filled 2022-11-11: qty 1

## 2022-11-11 MED ORDER — ADULT MULTIVITAMIN W/MINERALS CH
1.0000 | ORAL_TABLET | Freq: Every day | ORAL | Status: DC
  Administered 2022-11-11: 1 via ORAL
  Filled 2022-11-11: qty 1

## 2022-11-11 MED ORDER — THIAMINE MONONITRATE 100 MG PO TABS
100.0000 mg | ORAL_TABLET | Freq: Every day | ORAL | Status: DC
  Administered 2022-11-11: 100 mg via ORAL
  Filled 2022-11-11: qty 1

## 2022-11-11 MED ORDER — PANTOPRAZOLE SODIUM 40 MG PO TBEC
40.0000 mg | DELAYED_RELEASE_TABLET | Freq: Every day | ORAL | Status: DC
  Administered 2022-11-11: 40 mg via ORAL
  Filled 2022-11-11: qty 1

## 2022-11-11 MED ORDER — PANTOPRAZOLE SODIUM 40 MG PO TBEC
40.0000 mg | DELAYED_RELEASE_TABLET | Freq: Every day | ORAL | Status: DC
  Administered 2022-11-12 – 2022-11-22 (×11): 40 mg via ORAL
  Filled 2022-11-11 (×14): qty 1

## 2022-11-11 MED ORDER — LORAZEPAM 1 MG PO TABS
1.0000 mg | ORAL_TABLET | Freq: Two times a day (BID) | ORAL | Status: DC
  Administered 2022-11-13: 1 mg via ORAL
  Filled 2022-11-11: qty 1

## 2022-11-11 NOTE — Tx Team (Signed)
Initial Treatment Plan 11/11/2022 12:20 PM [name] [id number]    PATIENT STRESSORS: Substance abuse   Traumatic event     PATIENT STRENGTHS: Supportive family/friends    PATIENT IDENTIFIED PROBLEMS:   Suicide Attempt    Stroke 18 months ago    Substance Abuse (Alcohol and Cocaine)    Homeless       DISCHARGE CRITERIA:  Improved stabilization in mood, thinking, and/or behavior  PRELIMINARY DISCHARGE PLAN: Attend 12-step recovery group Outpatient therapy  PATIENT/FAMILY INVOLVEMENT: This treatment plan has been presented to and reviewed with the patient, [name].The patient has been given the opportunity to ask questions and make suggestions.  Earma Reading Thyra Yinger, RN 11/11/2022, 12:20 PM

## 2022-11-11 NOTE — Group Note (Signed)
LCSW Group Therapy Note   Group Date: 11/11/2022 Start Time: 1100 End Time: 1200   Type of Therapy and Topic:  Group Therapy: Boundaries  Participation Level:  Did Not Attend  Description of Group: This group will address the use of boundaries in their personal lives. Patients will explore why boundaries are important, the difference between healthy and unhealthy boundaries, and negative and postive outcomes of different boundaries and will look at how boundaries can be crossed.  Patients will be encouraged to identify current boundaries in their own lives and identify what kind of boundary is being set. Facilitators will guide patients in utilizing problem-solving interventions to address and correct types boundaries being used and to address when no boundary is being used. Understanding and applying boundaries will be explored and addressed for obtaining and maintaining a balanced life. Patients will be encouraged to explore ways to assertively make their boundaries and needs known to significant others in their lives, using other group members and facilitator for role play, support, and feedback.  Therapeutic Goals:  1.  Patient will identify areas in their life where setting clear boundaries could be  used to improve their life.  2.  Patient will identify signs/triggers that a boundary is not being respected. 3.  Patient will identify two ways to set boundaries in order to achieve balance in  their lives: 4.  Patient will demonstrate ability to communicate their needs and set boundaries  through discussion and/or role plays  Summary of Patient Progress:  No Show  Therapeutic Modalities:   Cognitive Behavioral Therapy Solution-Focused Therapy  Marinda Elk, LCSWA 11/11/2022  4:06 PM

## 2022-11-11 NOTE — Consult Note (Signed)
[facility] Consult Note  Patient Name: [name] MRN: [id number] DOB: 08/05/1965 DATE OF Consult: 11/11/2022  PRIMARY PSYCHIATRIC DIAGNOSES  1.  Major depressive disorder 2.  Alcohol abuse 3.  Cocaine abuse  RECOMMENDATIONS  Admit to inpatient psych for safety and stabilization.  Medication recommendations: Initiate Ciwa protocol. Give Zyprexa 5mg  PO Q8H PRN for agitation (Zyprexa and Lorazepam should be given 1 hour apart due to risk of respiratory depression). Please do not exceed 20 mg of olanzapine within a 24-hour period. Please stop all antipsychotic and QTc prolonging medications if patient's QTc is greater than 500 ms.  Non-Medication/therapeutic recommendations: Refer to outpatient psychiatric provider for medication management, therapy, and substance abuse treatment upon discharge from inpatient psych admission.   Communication: Treatment team members (and family members if applicable) who were involved in treatment/care discussions and planning, and with whom we spoke or engaged with via secure text/chat, include the following:  treatment team Clifford Payor MD, [name], [name])   Thank you for involving [place] in the care of this patient. If you have any additional questions or concerns, please call [phone] and ask for me or the provider on-call.  TELEPSYCHIATRY ATTESTATION & CONSENT  As the provider for this telehealth consult, I attest that I verified the patient's identity using two separate identifiers, introduced myself to the patient, provided my credentials, disclosed my location, and performed this encounter via a HIPAA-compliant, real-time, face-to-face, two-way, interactive audio and video platform and with the full consent and agreement of the patient (or guardian as applicable.)  Patient physical location: [facility]. Telehealth provider physical location: home office in state of [place].  Video start time: 2326 (Central Time) Video end time: 2337  [facility] Time)  IDENTIFYING DATA  [name] is a 57 y.o. year-old male for whom a psychiatric consultation has been ordered by the primary provider. The patient was identified using two separate identifiers.  CHIEF COMPLAINT/REASON FOR CONSULT  "I am ready to end it. I am tired of living"  HISTORY OF PRESENT ILLNESS (HPI)  The patient is a 57 year old male was brought in for evaluation, presenting with a chief complaint of feeling a profound weariness towards life and expressing explicit suicidal ideation with a plan to overdose on his medication. Patient does report overdoing on 10 of his prescribed 10mg  Norvasc prior to coming to the ER.  [name] disclosed a significant downturn in his life following a stroke approximately 18 months prior, which subsequently led to substance abuse issues. He admits to daily cocaine use, both snorting and smoking about an ounce, and consuming about a fifth of alcohol daily, with his last consumption reported as the previous morning. [name] denies any seizures or hallucinations that could be attributed to alcohol withdrawal and reports no tactile hallucinations such as feeling bugs on his skin. [name]'s history includes a suicide attempt 20 years ago, where he inflicted harm on himself by cutting both wrists, resulting in hospital admission. He has sought treatment through rehabilitation programs in the past but has never been admitted to a psychiatric facility. Currently, [name] is not engaged in any form of therapy, counseling, or psychiatric care, and he is not on any medication for depression. He is homeless and has a notable family history of suicide, with both his maternal grandfather and paternal uncle having taken their own lives. [name] reports experiencing poor sleep quality and is currently suffering from a headache, which he rates as a 5 on a scale of 0 to 10. He also experiences  cold sweats and shakiness intermittently. Regarding his family situation, [name]  mentions having five children who are now living with their mother, away from him. When discussing his current suicidal thoughts, he lists various methods he has considered, including overdose, asphyxiation, and firearms. [name] has consented to hospital admission for treatment.  PAST PSYCHIATRIC HISTORY  [name]'s history includes a suicide attempt 20 years ago, where he inflicted harm on himself by cutting both wrists, resulting in hospital admission. He has sought treatment through rehabilitation programs in the past but has never been admitted to a psychiatric facility. Currently, [name] is not engaged in any form of therapy, counseling, or psychiatric care, and he is not on any medication for depression  PAST MEDICAL HISTORY  Past Medical History:  Diagnosis Date   Acid reflux    Emphysema of lung (HCC)    Head injury    Hepatitis C    S/p treatment with Epclusa in 2020   Hiatal hernia    HTN (hypertension)    Pulmonary nodules    Stroke [facility])      HOME MEDICATIONS  Facility Ordered Medications  Medication   [COMPLETED] potassium chloride 10 mEq in 100 mL IVPB   PTA Medications  Medication Sig   amLODipine (NORVASC) 10 MG tablet Take 1 tablet (10 mg total) by mouth daily.   olmesartan (BENICAR) 20 MG tablet Take 1 tablet (20 mg total) by mouth daily.   pantoprazole (PROTONIX) 40 MG tablet Take 1 tablet (40 mg total) by mouth daily at 12 noon.   ipratropium-albuterol (DUONEB) 0.5-2.5 (3) MG/3ML SOLN Take 3 mLs by nebulization every 4 (four) hours as needed (wheezing, shortness of breath).   VENTOLIN HFA 108 (90 Base) MCG/ACT inhaler Inhale 2 puffs into the lungs every 6 (six) hours as needed for wheezing or shortness of breath.   predniSONE (STERAPRED UNI-PAK 21 TAB) 5 MG (21) TBPK tablet Take 5 mg by mouth as directed.   cefdinir (OMNICEF) 300 MG capsule Take 300 mg by mouth 2 (two) times daily.   azithromycin (ZITHROMAX) 250 MG tablet Take 250 mg by mouth daily.     ALLERGIES  No  Known Allergies  SOCIAL & SUBSTANCE USE HISTORY  Social History   Socioeconomic History   Marital status: Single    Spouse name: Not on file   Number of children: Not on file   Years of education: Not on file   Highest education level: Not on file  Occupational History   Not on file  Tobacco Use   Smoking status: Some Days    Current packs/day: 2.00    Average packs/day: 2.0 packs/day for 30.0 years (60.0 ttl pk-yrs)    Types: Cigarettes   Smokeless tobacco: Never   Tobacco comments:    1 cig a day since the last visit.  Vaping Use   Vaping status: Never Used  Substance and Sexual Activity   Alcohol use: Yes    Comment: 6 pack a week; but last drank 08/12/22.   Drug use: Yes    Types: Cocaine    Comment: states its "been a long time"    Sexual activity: Not Currently  Other Topics Concern   Not on file  Social History Narrative   Not on file   Social Determinants of Health   Financial Resource Strain: Low Risk  (06/01/2022)   Overall Financial Resource Strain (CARDIA)    Difficulty of Paying Living Expenses: Not very hard  Food Insecurity: No Food Insecurity (09/22/2022)  Hunger Vital Sign    Worried About Running Out of Food in the Last Year: Never true    Ran Out of Food in the Last Year: Never true  Transportation Needs: No Transportation Needs (06/01/2022)   PRAPARE - Administrator, Civil Service (Medical): No    Lack of Transportation (Non-Medical): No  Physical Activity: Insufficiently Active (06/01/2022)   Exercise Vital Sign    Days of Exercise per Week: 7 days    Minutes of Exercise per Session: 10 min  Stress: No Stress Concern Present (06/01/2022)   [name] [employer] - Occupational Stress Questionnaire    Feeling of Stress : Only a little  Social Connections: Moderately Isolated (06/01/2022)   Social Connection and Isolation Panel [NHANES]    Frequency of Communication with Friends and Family: More than three times a week     Frequency of Social Gatherings with Friends and Family: More than three times a week    Attends Religious Services: Never    Database administrator or Organizations: No    Attends Engineer, structural: Never    Marital Status: Living with partner   Social History   Tobacco Use  Smoking Status Some Days   Current packs/day: 2.00   Average packs/day: 2.0 packs/day for 30.0 years (60.0 ttl pk-yrs)   Types: Cigarettes  Smokeless Tobacco Never  Tobacco Comments   1 cig a day since the last visit.   Social History   Substance and Sexual Activity  Alcohol Use Yes   Comment: 6 pack a week; but last drank 08/12/22.   Social History   Substance and Sexual Activity  Drug Use Yes   Types: Cocaine   Comment: states its "been a long time"     Additional pertinent information   FAMILY HISTORY  Family History  Problem Relation Age of Onset   Diabetes Mother    Hypertension Mother    Stroke Father    Hypertension Father    Colon cancer Neg Hx    Family Psychiatric History (if known):  has a notable family history of suicide, with both his maternal grandfather and paternal uncle having taken their own lives.  MENTAL STATUS EXAM (MSE)  Presentation  General Appearance:  Appropriate for Environment  Eye Contact: Fair  Speech: Clear and Coherent  Speech Volume: Normal  Handedness: Right   Mood and Affect  Mood: Depressed; Hopeless  Affect: Congruent   Thought Process  Thought Processes: Coherent  Descriptions of Associations: Intact  Orientation: Full (Time, Place and Person)  Thought Content: Logical  History of Schizophrenia/Schizoaffective disorder:No data recorded Duration of Psychotic Symptoms:No data recorded Hallucinations:Hallucinations: None  Ideas of Reference: None  Suicidal Thoughts:Suicidal Thoughts: Yes, Active SI Active Intent and/or Plan: With Plan; With Intent  Homicidal Thoughts:Homicidal Thoughts: No   Sensorium   Memory: Immediate Good; Recent Good; Remote Good  Judgment: Fair  Insight: Fair   Art therapist  Concentration: Good  Attention Span: Good  Recall: Good  Fund of Knowledge: Good  Language: Good   Psychomotor Activity  Psychomotor Activity:Psychomotor Activity: Normal  Assets  Assets: Communication Skills   Sleep  Sleep:Sleep: Poor Number of Hours of Sleep: 4   VITALS  Blood pressure 115/86, pulse 76, temperature 98.4 F (36.9 C), temperature source Oral, resp. rate 16, height 5\' 10"  (1.778 m), weight 86.2 kg, SpO2 92%.  LABS  Admission on 11/10/2022  Component Date Value Ref Range Status   Sodium 11/10/2022 137  135 - 145 mmol/L Final   Potassium 11/10/2022 3.0 (L)  3.5 - 5.1 mmol/L Final   Chloride 11/10/2022 103  98 - 111 mmol/L Final   CO2 11/10/2022 25  22 - 32 mmol/L Final   Glucose, Bld 11/10/2022 124 (H)  70 - 99 mg/dL Final   Glucose reference range applies only to samples taken after fasting for at least 8 hours.   BUN 11/10/2022 16  6 - 20 mg/dL Final   Creatinine, Ser 11/10/2022 1.04  0.61 - 1.24 mg/dL Final   Calcium 09/81/1914 9.3  8.9 - 10.3 mg/dL Final   Total Protein 78/29/5621 7.4  6.5 - 8.1 g/dL Final   Albumin 30/86/5784 4.0  3.5 - 5.0 g/dL Final   AST 69/62/9528 22  15 - 41 U/L Final   ALT 11/10/2022 34  0 - 44 U/L Final   Alkaline Phosphatase 11/10/2022 109  38 - 126 U/L Final   Total Bilirubin 11/10/2022 0.5  0.3 - 1.2 mg/dL Final   GFR, Estimated 11/10/2022 >60  >60 mL/min Final   Comment: (NOTE) Calculated using the CKD-EPI Creatinine Equation (2021)    Anion gap 11/10/2022 9  5 - 15 Final   Performed at [facility], [address], [place], [place] [address]   Alcohol, Ethyl (B) 11/10/2022 <10  <10 mg/dL Final   Comment: (NOTE) Lowest detectable limit for serum alcohol is 10 mg/dL.  For medical purposes only. Performed at [facility], [address] [place], [place] [address]    Opiates 11/10/2022 NONE DETECTED   NONE DETECTED Final   Cocaine 11/10/2022 POSITIVE (A)  NONE DETECTED Final   Benzodiazepines 11/10/2022 NONE DETECTED  NONE DETECTED Final   Amphetamines 11/10/2022 NONE DETECTED  NONE DETECTED Final   Tetrahydrocannabinol 11/10/2022 POSITIVE (A)  NONE DETECTED Final   Barbiturates 11/10/2022 NONE DETECTED  NONE DETECTED Final   Comment: (NOTE) DRUG SCREEN FOR MEDICAL PURPOSES ONLY.  IF CONFIRMATION IS NEEDED FOR ANY PURPOSE, NOTIFY LAB WITHIN 5 DAYS.  LOWEST DETECTABLE LIMITS FOR URINE DRUG SCREEN Drug Class                     Cutoff (ng/mL) Amphetamine and metabolites    1000 Barbiturate and metabolites    200 Benzodiazepine                 200 Opiates and metabolites        300 Cocaine and metabolites        300 THC                            50 Performed at [facility], [address], [place], [place] [address]    WBC 11/10/2022 9.4  4.0 - 10.5 K/uL Final   RBC 11/10/2022 5.03  4.22 - 5.81 MIL/uL Final   Hemoglobin 11/10/2022 15.3  13.0 - 17.0 g/dL Final   HCT 64/40/3474 47.1  39.0 - 52.0 % Final   MCV 11/10/2022 93.6  80.0 - 100.0 fL Final   MCH 11/10/2022 30.4  26.0 - 34.0 pg Final   MCHC 11/10/2022 32.5  30.0 - 36.0 g/dL Final   RDW 25/95/6387 13.8  11.5 - 15.5 % Final   Platelets 11/10/2022 341  150 - 400 K/uL Final   nRBC 11/10/2022 0.0  0.0 - 0.2 % Final   Neutrophils Relative % 11/10/2022 73  % Final   Neutro Abs 11/10/2022 6.9  1.7 - 7.7  K/uL Final   Lymphocytes Relative 11/10/2022 16  % Final   Lymphs Abs 11/10/2022 1.5  0.7 - 4.0 K/uL Final   Monocytes Relative 11/10/2022 5  % Final   Monocytes Absolute 11/10/2022 0.5  0.1 - 1.0 K/uL Final   Eosinophils Relative 11/10/2022 4  % Final   Eosinophils Absolute 11/10/2022 0.4  0.0 - 0.5 K/uL Final   Basophils Relative 11/10/2022 1  % Final   Basophils Absolute 11/10/2022 0.1  0.0 - 0.1 K/uL Final   Immature Granulocytes 11/10/2022 1  % Final   Abs Immature Granulocytes 11/10/2022 0.06  0.00 - 0.07 K/uL Final    Performed at [facility], [address] [place], [place] [address]   Acetaminophen (Tylenol), Serum 11/10/2022 <10 (L)  10 - 30 ug/mL Final   Comment: (NOTE) Therapeutic concentrations vary significantly. A range of 10-30 ug/mL  may be an effective concentration for many patients. However, some  are best treated at concentrations outside of this range. Acetaminophen concentrations >150 ug/mL at 4 hours after ingestion  and >50 ug/mL at 12 hours after ingestion are often associated with  toxic reactions.  Performed at [facility], [address], [place], [place] [address]    Salicylate Lvl 11/10/2022 <7.0 (L)  7.0 - 30.0 mg/dL Final   Performed at [facility], [address], [place], [place] [address]   Magnesium 11/10/2022 2.2  1.7 - 2.4 mg/dL Final   Performed at [facility], [address], [place], [place] [address]   Acetaminophen (Tylenol), Serum 11/10/2022 <10 (L)  10 - 30 ug/mL Final   Comment: (NOTE) Therapeutic concentrations vary significantly. A range of 10-30 ug/mL  may be an effective concentration for many patients. However, some  are best treated at concentrations outside of this range. Acetaminophen concentrations >150 ug/mL at 4 hours after ingestion  and >50 ug/mL at 12 hours after ingestion are often associated with  toxic reactions.  Performed at [facility], [address] [place], [place] [address]     PSYCHIATRIC REVIEW OF SYSTEMS (ROS)  - Patient expressed suicidal ideation, stating he is "ready to end it" and is "tired of living." - Reported a history of a suicide attempt approximately 20 years ago by cutting his wrists. - Disclosed current suicidal thoughts, mentioning methods such as overdose, asphyxiation, and firearms. - Reported family history of suicide, including his grandfather and his father's brother.  Additional findings:      Musculoskeletal: No abnormal movements observed      Gait & Station: Laying/Sitting      Pain Screening: Present -  mild to moderate      Nutrition & Dental Concerns: Decrease in food intake and/or loss of appetite  RISK FORMULATION/ASSESSMENT  Is the patient experiencing any suicidal or homicidal ideations: Yes       Explain if yes: suicide attempt by overdose and suicidal ideations with plan Protective factors considered for safety management: access to appropriate clinical interventions.  Risk factors/concerns considered for safety management:  Prior attempt Family history of suicide Depression Substance abuse/dependence Physical illness/chronic pain Access to lethal means Hopelessness Male gender Unmarried  Is there a safety management plan with the patient and treatment team to minimize risk factors and promote protective factors: Yes           Explain: Admit to psych Is crisis care placement or psychiatric hospitalization recommended: Yes     Based on my current evaluation and risk assessment, patient is determined at this time to be  at:  High risk  *RISK ASSESSMENT Risk assessment is a dynamic process; it is possible that this patient's condition, and risk level, may change. This should be re-evaluated and managed over time as appropriate. Please re-consult psychiatric consult services if additional assistance is needed in terms of risk assessment and management. If your team decides to discharge this patient, please advise the patient how to best access emergency psychiatric services, or to call 911, if their condition worsens or they feel unsafe in any way.   Norval Morton, NP Telepsychiatry Consult Services

## 2022-11-11 NOTE — Progress Notes (Addendum)
[facility] Admission Note:  [name] is a 57 year old male voluntarily admitted to [name] on 11/11/22 following a suicide attempt by overdosing on 9-10 of prescribed 10mg  Norvasc pills. Pt reports that he had a stroke 18 months ago which led him to have substance abuse issues with alcohol and cocaine.  Pt reports that he drinks a fifth of liquor everyday and uses cocaine everyday as well. Pt reports he had a previous suicide attempt 20 years ago by cutting both of his wrists. Pt reports that he is currently homeless.   Pt denies SI/HI/AVH on admission. Pt presents with irritable affect. Pt's skin assessed, pt has a scar on his right shoulder from previous surgery and superficial scratches and scabs on bilateral forearms.   Pt oriented to the unit and provided with a salad. Q 15 minute safety checks initiated.

## 2022-11-11 NOTE — Progress Notes (Signed)
   11/11/22 2221  Psych Admission Type (Psych Patients Only)  Admission Status Voluntary  Psychosocial Assessment  Patient Complaints Anxiety;Depression  Eye Contact Fair  Facial Expression Animated  Affect Anxious  Speech Logical/coherent  Interaction Assertive  Motor Activity Slow  Appearance/Hygiene In scrubs  Behavior Characteristics Cooperative;Appropriate to situation  Mood Anxious;Pleasant  Thought Process  Coherency WDL  Content WDL  Delusions None reported or observed  Perception WDL  Hallucination None reported or observed  Judgment Impaired  Confusion None  Danger to Self  Current suicidal ideation? Passive  Self-Injurious Behavior Some self-injurious ideation observed or expressed.  No lethal plan expressed   Agreement Not to Harm Self Yes  Description of Agreement verbal  Danger to Others  Danger to Others None reported or observed

## 2022-11-11 NOTE — [facility] Suicide Risk Assessment (Signed)
[facility] Admission Suicide Risk Assessment   Nursing information obtained from:  Patient Demographic factors:  Male, Living alone Current Mental Status:  Suicidal ideation indicated by patient Loss Factors:  Decline in physical health Historical Factors:  Impulsivity Risk Reduction Factors:  Positive social support  Total Time spent with patient: 1 hour Principal Problem: MDD (major depressive disorder), recurrent severe, without psychosis (HCC) Diagnosis:  Principal Problem:   MDD (major depressive disorder), recurrent severe, without psychosis (HCC) Active Problems:   Moderate cocaine use disorder (HCC)   Alcohol dependence (HCC)  Subjective Data: [name] is a 57 y.o., male with a past psychiatric history significant for alcohol use disorder severe dependence, cocaine use disorder who presents to the [facility] from [facility] emergency department for evaluation and management of worsening depression, SI, status post suicide attempt by overdose on 10 tablets of blood pressure medication.   Continued Clinical Symptoms:  Alcohol Use Disorder Identification Test Final Score (AUDIT): 40 The "Alcohol Use Disorders Identification Test", Guidelines for Use in Primary Care, Second Edition.  World Science writer [facility]). Score between 0-7:  no or low risk or alcohol related problems. Score between 8-15:  moderate risk of alcohol related problems. Score between 16-19:  high risk of alcohol related problems. Score 20 or above:  warrants further diagnostic evaluation for alcohol dependence and treatment.   CLINICAL FACTORS:   Depression:   Anhedonia Hopelessness Impulsivity Insomnia Severe   Musculoskeletal: Strength & Muscle Tone: within normal limits Gait & Station: normal Patient leans: N/A  Psychiatric Specialty Exam:  Presentation  General Appearance:  Appropriate for Environment  Eye Contact: Fair  Speech: Clear and Coherent  Speech  Volume: Normal  Handedness: Right   Mood and Affect  Mood: Depressed; Hopeless  Affect: Congruent   Thought Process  Thought Processes: Coherent  Descriptions of Associations:Intact  Orientation:Full (Time, Place and Person)  Thought Content:Logical  History of Schizophrenia/Schizoaffective disorder:No data recorded Duration of Psychotic Symptoms:No data recorded Hallucinations:Hallucinations: None  Ideas of Reference:None  Suicidal Thoughts:Suicidal Thoughts: Yes, Active SI Active Intent and/or Plan: With Plan; With Intent  Homicidal Thoughts:Homicidal Thoughts: No   Sensorium  Memory: Immediate Good; Recent Good; Remote Good  Judgment: Fair  Insight: Fair   Art therapist  Concentration: Good  Attention Span: Good  Recall: Good  Fund of Knowledge: Good  Language: Good   Psychomotor Activity  Psychomotor Activity: Psychomotor Activity: Normal   Assets  Assets: Communication Skills   Sleep  Sleep: Sleep: Poor Number of Hours of Sleep: 4    Physical Exam: Physical Exam ROS Blood pressure 118/82, pulse 89, temperature 98.2 F (36.8 C), temperature source Oral, resp. rate 18, height 5\' 10"  (1.778 m), weight 81.2 kg, SpO2 98%. Body mass index is 25.68 kg/m.   COGNITIVE FEATURES THAT CONTRIBUTE TO RISK:  Polarized thinking    SUICIDE RISK:   Severe:  Frequent, intense, and enduring suicidal ideation, specific plan, no subjective intent, but some objective markers of intent (i.e., choice of lethal method), the method is accessible, some limited preparatory behavior, evidence of impaired self-control, severe dysphoria/symptomatology, multiple risk factors present, and few if any protective factors, particularly a lack of social support.  PLAN OF CARE:  Safety and Monitoring:             -- Voluntary admission to inpatient psychiatric unit for safety, stabilization and treatment             -- Daily contact with patient  to assess and evaluate  symptoms and progress in treatment             -- Patient's case to be discussed in multi-disciplinary team meeting             -- Observation Level : q15 minute checks             -- Vital signs:  q12 hours             -- Precautions: suicide, elopement, and assault   2. Medications:              Started Prozac 10 mg daily then titrated to 20 mg daily for depression and long-term treatment of anxiety             Start gabapentin 100 mg 3 times daily for alcohol dependence and craving as well as chronic pain, also will help with mood and anxiety             Started trazodone 50 mg at bedtime as needed for sleep             Started Vistaril 25 mg 3 times daily as needed for anxiety             Ativan taper for alcohol withdrawals             CIWA protocol for alcohol withdrawals and follow-up             Restart pantoprazole 40 mg daily, home medication for GERD             Restart Norvasc 10 mg daily for hypertension, home medication             Patient reports being on Percocet prior to admission for chronic pain but per PDMP patient was prescribed oxycodone back in March for only few days, given substance use disorder and alcohol dependence would not start opiates at this time, he is on Tylenol as needed for pain in addition to gabapentin for chronic pain long-term treatment. -- The risks/benefits/side-effects/alternatives to this medication were discussed in detail with the patient and time was given for questions. The patient consents to medication trial.                            3. Labs Reviewed: CBC within normal level, CMP within normal level except for low potassium 3.0 received potassium in the emergency room, alcohol level less than 10, UDS positive for cocaine and marijuana        Lab ordered: CMP 10/3 to follow potassium level   4. Tobacco Use Disorder             -- Patient quit smoking 2 weeks ago, declines NicoDerm patch at this time, will follow              -- Smoking cessation encouraged   5. Group and Therapy: -- Encouraged patient to participate in unit milieu and in scheduled group therapies              --Substance Use counseling: Patient was counseled regarding need to abstain completely from alcohol marijuana and cocaine after discharge.   Patient is interested to go to inpatient residential rehab for substance use rehab after discharge if that is not successful he is in agreement with intensive outpatient rehab treatment, will follow.   6. Discharge Planning:              -- Social work and case  management to assist with discharge planning and identification of hospital follow-up needs prior to discharge             -- Estimated LOS: 5-7 days             -- Discharge Concerns: Need to establish a safety plan; Medication compliance and effectiveness             -- Discharge Goals: Return home with outpatient referrals for mental health follow-up including medication management/psychotherapy     The patient is agreeable with the medication plan, as above. We will monitor the patient's response to pharmacologic treatment, and adjust medications as necessary. Patient is encouraged to participate in group therapy while admitted to the psychiatric unit. We will address other chronic and acute stressors, which contributed to the patient's worsening anxiety and depression, SI, in order to reduce the risk of self-harm at discharge.    I certify that inpatient services furnished can reasonably be expected to improve the patient's condition.   Kenslei Hearty Abbott Pao, MD 11/11/2022, 2:13 PM

## 2022-11-11 NOTE — Patient Outreach (Signed)
Care Coordination  11/11/2022  Turhan Chill 1965/10/27 [id number]  Idris Edmundson is currently admitted as an inpatient at  [facility] . The [facility] Managed Care team will follow the progress of Ishmeal Rorie and follow up upon discharge.   [name] RN, BSN [place]  [employer] - Managed Medicaid High Risk [phone]

## 2022-11-11 NOTE — Plan of Care (Signed)
  Problem: Education: Goal: Knowledge of [facility] Education information/materials will improve Outcome: Progressing Goal: Verbalization of understanding the information provided will improve Outcome: Progressing   

## 2022-11-11 NOTE — H&P (Signed)
Psychiatric Admission Assessment Adult  Patient Identification: [name] MRN:  [id number] Date of Evaluation:  11/11/2022  Chief Complaint:  MDD (major depressive disorder), recurrent severe, without psychosis (HCC) [F33.2]  History of Present Illness:  [name] is a 57 y.o., male with a past psychiatric history significant for alcohol use disorder severe dependence, cocaine use disorder who presents to the [facility] from [facility] emergency department for evaluation and management of worsening depression, SI, status post suicide attempt by overdose on 10 tablets of blood pressure medication.  According to outside records, the patient presented to [facility] emergency room after he took a handful of Norvasc 10 mg he estimates it was 5 to 6 tablets he primarily reports that it was to kill himself but later admits that it was more of a cry for help, then he told his girlfriend and she took him to the emergency room.   Initial assessment on 11/11/2022, patient was evaluated on the inpatient unit, the patient reports He reports history of depression on and off for at least 10 years worsening over the past year and had since he had a stroke.  Despite the fact that the stroke did not leave any significant weakness or paralysis his depression continues to worsen over the past few months with decreased sleep decreased energy and motivation decreased interest, concentration, reports fair appetite, reports feeling hopeless helpless and worthless, denies active SI intention or plan at time of the evaluation and able to contract for safety in the hospital but reports passive SI wishing self dead.  He regrets his suicide attempt by overdose and reports it to be more of a cry for help.  He also describes symptoms consistent with generalized anxiety, reports his main goal for being in the hospital is to get clean from alcohol and drugs, reports main challenge for him causing relapse is "to  control my nerves".  He denies HI or AVH.  He denies symptoms consistent with mania hypomania or PTSD.  Chart review: notes from ER were reviewed  Psych meds prior to admission:  None.  Sleep Sleep:Sleep: Poor Number of Hours of Sleep: 4   Collateral information: none at this time. Patient agrees to contact his daughter [name] for collateral information.  Past Psychiatric History:  Prior Psychiatric diagnoses: none Past Psychiatric Hospitalizations: once 20 yrs ago after cut his wrist in suicide attempt  History of self mutilation: denies Past suicide attempts: x 2, 20 yrs ago cut his wrist, 1 and 1/2 yrs ago by OD on fentanyl Past history of HI, violent or aggressive behavior: denies  Past Psychiatric medications trials: none History of ECT/TMS: denies  Outpatient psychiatric Follow up: denies Prior Outpatient Therapy: denies    Is the patient at risk to self? Yes.    Has the patient been a risk to self in the past 6 months? No.  Has the patient been a risk to self within the distant past? No.  Is the patient a risk to others? No.  Has the patient been a risk to others in the past 6 months? No.  Has the patient been a risk to others within the distant past? No.    Substance Use History: Alcohol: Started drinking alcohol at age 70, has been drinking 1/5 daily for the past 30 years, denies history of blackouts or withdrawals Tobacoo: Quit smoking 2 weeks ago, was smoking for 25 years prior Marijuana: Smokes marijuana occasionally about twice monthly Cocaine: Using cocaine daily about an ounce for  the past 25 years Stimulants: Denies IV drug use: Denies Opiates: denies Prescribed Meds abuse: denies H/O withdrawals, blackouts, DTs: denies History of Detox / Rehab: denies DUI: x 1, 2 wks ago, court date 10/9  Alcohol Screening: 1. How often do you have a drink containing alcohol?: 4 or more times a week 2. How many drinks containing alcohol do you have on a typical day  when you are drinking?: 10 or more 3. How often do you have six or more drinks on one occasion?: Daily or almost daily AUDIT-C Score: 12 4. How often during the last year have you found that you were not able to stop drinking once you had started?: Daily or almost daily 5. How often during the last year have you failed to do what was normally expected from you because of drinking?: Daily or almost daily 6. How often during the last year have you needed a first drink in the morning to get yourself going after a heavy drinking session?: Daily or almost daily 7. How often during the last year have you had a feeling of guilt of remorse after drinking?: Daily or almost daily 8. How often during the last year have you been unable to remember what happened the night before because you had been drinking?: Daily or almost daily 9. Have you or someone else been injured as a result of your drinking?: Yes, during the last year 10. Has a relative or friend or a doctor or another health worker been concerned about your drinking or suggested you cut down?: Yes, during the last year Alcohol Use Disorder Identification Test Final Score (AUDIT): 40 Alcohol Brief Interventions/Follow-up: Alcohol education/Brief advice  Substance Abuse History in the last 12 months:  Yes.     Tobacco Screening:     Past Medical/Surgical History:  Past Medical History:  Diagnosis Date   Acid reflux    Emphysema of lung (HCC)    Head injury    Hepatitis C    S/p treatment with Epclusa in 2020   Hiatal hernia    HTN (hypertension)    Pulmonary nodules    Stroke [facility])     Past Surgical History:  Procedure Laterality Date   ACROMIO-CLAVICULAR JOINT REPAIR Right 06/28/2018   Procedure: ACROMIO-CLAVICULAR JOINT IRRIGATION AND DEBRIDEMENT;  Surgeon: Cammy Copa, MD;  Location: [facility] OR;  Service: Orthopedics;  Laterality: Right;   FACIAL FRACTURE SURGERY     IRRIGATION AND DEBRIDEMENT SHOULDER Right 06/28/2018    Procedure: IRRIGATION AND DEBRIDEMENT SHOULDER;  Surgeon: Cammy Copa, MD;  Location: [facility] OR;  Service: Orthopedics;  Laterality: Right;   KNEE ARTHROSCOPY Left 06/23/2018   Procedure: LEFT KNEE ARTHROSCOPY KNEE I&D.;  Surgeon: Cammy Copa, MD;  Location: [facility] OR;  Service: Orthopedics;  Laterality: Left;    Family History:  Family History  Problem Relation Age of Onset   Diabetes Mother    Hypertension Mother    Stroke Father    Hypertension Father    Colon cancer Neg Hx     Family Psychiatric History:  Psychiatric illness: denies Suicide: has a notable family history of suicide, with both his maternal grandfather and paternal uncle having taken their own lives.  Substance Abuse: multiple male family members with alcohol dependence  Social History:  Social History   Substance and Sexual Activity  Alcohol Use Yes   Comment: 6 pack a week; but last drank 08/12/22.     Social History   Substance and Sexual Activity  Drug Use Yes   Types: Cocaine   Comment: states its "been a long time"     Living situation: Was living with his girlfriend in [place] but will not be going back there "my daughter is trying to find me a place" Social support: His oldest daughter [name] is supportive, she lives in [place] Marital Status: Was married once for about 20 years and got divorced 22 years ago Children: 6 children 2 sons and 4 daughters ages 41 years old to 74 years old, he only has relationship with his oldest daughter [name] who is supportive Education: 10th grade Employment: Currently on disability for the past year for medical reasons Military service: Denies Legal history: Has been to jail and the present several times last time to jail 3 years ago last time to prison 20 years ago for "running guns" has court on October 9 for DUI Trauma: Denies Access to guns: Has guns at girlfriend's house for hunting but he does not plan to go back there   Allergies:  No Known  Allergies  Lab Results:  Results for orders placed or performed during the hospital encounter of 11/10/22 (from the past 48 hour(s))  Comprehensive metabolic panel     Status: Abnormal   Collection Time: 11/10/22  1:36 PM  Result Value Ref Range   Sodium 137 135 - 145 mmol/L   Potassium 3.0 (L) 3.5 - 5.1 mmol/L   Chloride 103 98 - 111 mmol/L   CO2 25 22 - 32 mmol/L   Glucose, Bld 124 (H) 70 - 99 mg/dL    Comment: Glucose reference range applies only to samples taken after fasting for at least 8 hours.   BUN 16 6 - 20 mg/dL   Creatinine, Ser 1.61 0.61 - 1.24 mg/dL   Calcium 9.3 8.9 - 09.6 mg/dL   Total Protein 7.4 6.5 - 8.1 g/dL   Albumin 4.0 3.5 - 5.0 g/dL   AST 22 15 - 41 U/L   ALT 34 0 - 44 U/L   Alkaline Phosphatase 109 38 - 126 U/L   Total Bilirubin 0.5 0.3 - 1.2 mg/dL   GFR, Estimated >04 >54 mL/min    Comment: (NOTE) Calculated using the CKD-EPI Creatinine Equation (2021)    Anion gap 9 5 - 15    Comment: Performed at [facility], [address] [place], [place] [address]  Ethanol     Status: None   Collection Time: 11/10/22  1:36 PM  Result Value Ref Range   Alcohol, Ethyl (B) <10 <10 mg/dL    Comment: (NOTE) Lowest detectable limit for serum alcohol is 10 mg/dL.  For medical purposes only. Performed at [facility], [address], [place], [place] [address]   CBC with Differential     Status: None   Collection Time: 11/10/22  1:36 PM  Result Value Ref Range   WBC 9.4 4.0 - 10.5 K/uL   RBC 5.03 4.22 - 5.81 MIL/uL   Hemoglobin 15.3 13.0 - 17.0 g/dL   HCT 29.5 62.1 - 30.8 %   MCV 93.6 80.0 - 100.0 fL   MCH 30.4 26.0 - 34.0 pg   MCHC 32.5 30.0 - 36.0 g/dL   RDW 65.7 84.6 - 96.2 %   Platelets 341 150 - 400 K/uL   nRBC 0.0 0.0 - 0.2 %   Neutrophils Relative % 73 %   Neutro Abs 6.9 1.7 - 7.7 K/uL   Lymphocytes Relative 16 %   Lymphs Abs 1.5 0.7 - 4.0 K/uL  Monocytes Relative 5 %   Monocytes Absolute 0.5 0.1 - 1.0 K/uL   Eosinophils Relative 4 %    Eosinophils Absolute 0.4 0.0 - 0.5 K/uL   Basophils Relative 1 %   Basophils Absolute 0.1 0.0 - 0.1 K/uL   Immature Granulocytes 1 %   Abs Immature Granulocytes 0.06 0.00 - 0.07 K/uL    Comment: Performed at [facility], [address] [place], [place] [address]  Acetaminophen level     Status: Abnormal   Collection Time: 11/10/22  1:36 PM  Result Value Ref Range   Acetaminophen (Tylenol), Serum <10 (L) 10 - 30 ug/mL    Comment: (NOTE) Therapeutic concentrations vary significantly. A range of 10-30 ug/mL  may be an effective concentration for many patients. However, some  are best treated at concentrations outside of this range. Acetaminophen concentrations >150 ug/mL at 4 hours after ingestion  and >50 ug/mL at 12 hours after ingestion are often associated with  toxic reactions.  Performed at [facility], [address] [place], [place] [address]   Salicylate level     Status: Abnormal   Collection Time: 11/10/22  1:36 PM  Result Value Ref Range   Salicylate Lvl <7.0 (L) 7.0 - 30.0 mg/dL    Comment: Performed at [facility], [address] [place], [place] [address]  Magnesium     Status: None   Collection Time: 11/10/22  1:36 PM  Result Value Ref Range   Magnesium 2.2 1.7 - 2.4 mg/dL    Comment: Performed at [facility], [address] [place], [place] [address]  Urine rapid drug screen (hosp performed)     Status: Abnormal   Collection Time: 11/10/22  4:29 PM  Result Value Ref Range   Opiates NONE DETECTED NONE DETECTED   Cocaine POSITIVE (A) NONE DETECTED   Benzodiazepines NONE DETECTED NONE DETECTED   Amphetamines NONE DETECTED NONE DETECTED   Tetrahydrocannabinol POSITIVE (A) NONE DETECTED   Barbiturates NONE DETECTED NONE DETECTED    Comment: (NOTE) DRUG SCREEN FOR MEDICAL PURPOSES ONLY.  IF CONFIRMATION IS NEEDED FOR ANY PURPOSE, NOTIFY LAB WITHIN 5 DAYS.  LOWEST DETECTABLE LIMITS FOR URINE DRUG SCREEN Drug Class                     Cutoff  (ng/mL) Amphetamine and metabolites    1000 Barbiturate and metabolites    200 Benzodiazepine                 200 Opiates and metabolites        300 Cocaine and metabolites        300 THC                            50 Performed at [facility], [address] [place], [place] [address]   Acetaminophen level     Status: Abnormal   Collection Time: 11/10/22  6:36 PM  Result Value Ref Range   Acetaminophen (Tylenol), Serum <10 (L) 10 - 30 ug/mL    Comment: (NOTE) Therapeutic concentrations vary significantly. A range of 10-30 ug/mL  may be an effective concentration for many patients. However, some  are best treated at concentrations outside of this range. Acetaminophen concentrations >150 ug/mL at 4 hours after ingestion  and >50 ug/mL at 12 hours after ingestion are often associated with  toxic reactions.  Performed at [facility], [address] [place], [place] [address]  Blood Alcohol level:  Lab Results  Component Value Date   ETH <10 11/10/2022   ETH <10 12/30/2021    Metabolic Disorder Labs:  Lab Results  Component Value Date   HGBA1C 5.5 10/22/2021   MPG 111.15 10/22/2021   MPG 117 (H) 12/17/2013   No results found for: "PROLACTIN" Lab Results  Component Value Date   CHOL 161 10/22/2021   TRIG 238 (H) 10/22/2021   HDL 16 (L) 10/22/2021   CHOLHDL 10.1 10/22/2021   VLDL 48 (H) 10/22/2021   LDLCALC 97 10/22/2021   LDLCALC 109 (H) 12/17/2013    Current Medications: Current Facility-Administered Medications  Medication Dose Route Frequency Provider Last Rate Last Admin   acetaminophen (TYLENOL) tablet 650 mg  650 mg Oral Q6H PRN Onuoha, Chinwendu V, NP       alum & mag hydroxide-simeth (MAALOX/MYLANTA) 200-200-20 MG/5ML suspension 15 mL  15 mL Oral Q4H PRN Onuoha, Chinwendu V, NP   15 mL at 11/11/22 1154   [START ON 11/12/2022] amLODipine (NORVASC) tablet 10 mg  10 mg Oral Daily Shree Espey, MD       FLUoxetine (PROZAC) capsule 10 mg  10 mg Oral Daily  Oree Hislop, MD       Followed by   Melene Muller ON 11/13/2022] FLUoxetine (PROZAC) capsule 20 mg  20 mg Oral Daily Ltanya Bayley, MD       gabapentin (NEURONTIN) capsule 100 mg  100 mg Oral TID Abbott Pao, Drayk Humbarger, MD       hydrOXYzine (ATARAX) tablet 25 mg  25 mg Oral Q6H PRN Onuoha, Chinwendu V, NP       loperamide (IMODIUM) capsule 2-4 mg  2-4 mg Oral PRN Onuoha, Chinwendu V, NP       LORazepam (ATIVAN) tablet 2 mg  2 mg Oral TID PRN Onuoha, Chinwendu V, NP       Or   LORazepam (ATIVAN) injection 2 mg  2 mg Intramuscular TID PRN Onuoha, Chinwendu V, NP       LORazepam (ATIVAN) tablet 1 mg  1 mg Oral Q6H PRN Onuoha, Chinwendu V, NP       LORazepam (ATIVAN) tablet 1 mg  1 mg Oral QID Massengill, Nathan, MD   1 mg at 11/11/22 1249   Followed by   Melene Muller ON 11/12/2022] LORazepam (ATIVAN) tablet 1 mg  1 mg Oral TID Massengill, Harrold Donath, MD       Followed by   Melene Muller ON 11/13/2022] LORazepam (ATIVAN) tablet 1 mg  1 mg Oral BID Massengill, Harrold Donath, MD       Followed by   Melene Muller ON 11/14/2022] LORazepam (ATIVAN) tablet 1 mg  1 mg Oral Daily Massengill, Harrold Donath, MD       magnesium hydroxide (MILK OF MAGNESIA) suspension 15 mL  15 mL Oral Daily PRN Onuoha, Chinwendu V, NP       multivitamin with minerals tablet 1 tablet  1 tablet Oral Daily Onuoha, Chinwendu V, NP       ondansetron (ZOFRAN-ODT) disintegrating tablet 4 mg  4 mg Oral Q6H PRN Onuoha, Chinwendu V, NP       pantoprazole (PROTONIX) EC tablet 40 mg  40 mg Oral Daily Abbott Pao, Zia Najera, MD       [START ON 11/12/2022] thiamine (Vitamin B-1) tablet 100 mg  100 mg Oral Daily Onuoha, Chinwendu V, NP       traZODone (DESYREL) tablet 50 mg  50 mg Oral QHS PRN Sarita Bottom, MD        PTA Medications: Medications  Prior to Admission  Medication Sig Dispense Refill Last Dose   acetaminophen (TYLENOL) 325 MG tablet Take 650 mg by mouth every 6 (six) hours as needed for moderate pain.      amLODipine (NORVASC) 10 MG tablet Take 1 tablet (10 mg total) by mouth daily. 90  tablet 1    azithromycin (ZITHROMAX) 250 MG tablet Take 250 mg by mouth daily.      cefdinir (OMNICEF) 300 MG capsule Take 300 mg by mouth 2 (two) times daily.      Cyanocobalamin (VITAMIN B 12 PO) Take 1 tablet by mouth daily.      ipratropium-albuterol (DUONEB) 0.5-2.5 (3) MG/3ML SOLN Take 3 mLs by nebulization every 4 (four) hours as needed (wheezing, shortness of breath).      olmesartan (BENICAR) 20 MG tablet Take 1 tablet (20 mg total) by mouth daily. 90 tablet 1    pantoprazole (PROTONIX) 40 MG tablet Take 1 tablet (40 mg total) by mouth daily at 12 noon. 90 tablet 1    predniSONE (STERAPRED UNI-PAK 21 TAB) 5 MG (21) TBPK tablet Take 5 mg by mouth as directed.      VENTOLIN HFA 108 (90 Base) MCG/ACT inhaler Inhale 2 puffs into the lungs every 6 (six) hours as needed for wheezing or shortness of breath.       Musculoskeletal: Strength & Muscle Tone: within normal limits Gait & Station: normal Patient leans: N/A   Physical Findings: AIMS:  , ,  ,  ,    CIWA:  CIWA-Ar Total: 7 COWS:     Psychiatric Specialty Exam:  General Appearance: Appears older than stated age, disheveled, dressed in hospital gown, below average hygiene  Behavior: Calm and cooperative  Psychomotor Activity: No agitation noted, mild psychomotor retardation noted  Eye Contact: Limited Speech: Decreased amount, decreased tone and volume   Mood: Moderately dysphoric Affect: Restricted sad affect  Thought Process: Linear and goal-directed Descriptions of Associations: Concrete Thought Content: Hallucinations: Denies AH, VH  Delusions: No paranoia  Suicidal Thoughts: Reports passive SI wishing self dead, denies active SI, intention, plan  Homicidal Thoughts: Denies HI, intention, plan   Alertness/Orientation: Alert and oriented  Insight: poor Judgment: poor  Memory: Intact, slightly limited  Executive Functions  Concentration: Fair Attention Span: fair Recall: [employer] of Knowledge:  Fair   Physical Exam:  Physical Exam Vitals and nursing note reviewed.  Constitutional:      Appearance: Normal appearance.  HENT:     Head: Normocephalic and atraumatic.     Nose: Nose normal.  Eyes:     Extraocular Movements: Extraocular movements intact.     Pupils: Pupils are equal, round, and reactive to light.  Pulmonary:     Effort: Pulmonary effort is normal.  Musculoskeletal:        General: Normal range of motion.     Cervical back: Normal range of motion.  Neurological:     General: No focal deficit present.     Mental Status: He is alert and oriented to person, place, and time. Mental status is at baseline.    Review of Systems  Musculoskeletal:  Positive for back pain.  All other systems reviewed and are negative.  Blood pressure 118/82, pulse 89, temperature 98.2 F (36.8 C), temperature source Oral, resp. rate 18, height 5\' 10"  (1.778 m), weight 81.2 kg, SpO2 98%. Body mass index is 25.68 kg/m.   Assets  Assets:Communication Skills    Treatment Plan Summary: Daily contact with patient to assess and  evaluate symptoms and progress in treatment and Medication management  ASSESSMENT:  Principal Diagnosis: MDD (major depressive disorder), recurrent severe, without psychosis (HCC) Diagnosis:  Principal Problem:   MDD (major depressive disorder), recurrent severe, without psychosis (HCC) Active Problems:   Moderate cocaine use disorder (HCC)   Alcohol dependence (HCC)   PLAN: Safety and Monitoring:  -- Voluntary admission to inpatient psychiatric unit for safety, stabilization and treatment  -- Daily contact with patient to assess and evaluate symptoms and progress in treatment  -- Patient's case to be discussed in multi-disciplinary team meeting  -- Observation Level : q15 minute checks  -- Vital signs:  q12 hours  -- Precautions: suicide, elopement, and assault  2. Medications:   Started Prozac 10 mg daily then titrated to 20 mg daily for  depression and long-term treatment of anxiety  Start gabapentin 100 mg 3 times daily for alcohol dependence and craving as well as chronic pain, also will help with mood and anxiety  Started trazodone 50 mg at bedtime as needed for sleep  Started Vistaril 25 mg 3 times daily as needed for anxiety  Ativan taper for alcohol withdrawals  CIWA protocol for alcohol withdrawals and follow-up  Restart pantoprazole 40 mg daily, home medication for GERD  Restart Norvasc 10 mg daily for hypertension, home medication  Patient reports being on Percocet prior to admission for chronic pain but per PDMP patient was prescribed oxycodone back in March for only few days, given substance use disorder and alcohol dependence would not start opiates at this time, he is on Tylenol as needed for pain in addition to gabapentin for chronic pain long-term treatment. -- The risks/benefits/side-effects/alternatives to this medication were discussed in detail with the patient and time was given for questions. The patient consents to medication trial.      3. Labs Reviewed: CBC within normal level, CMP within normal level except for low potassium 3.0 received potassium in the emergency room, alcohol level less than 10, UDS positive for cocaine and marijuana      Lab ordered: CMP 10/3 to follow potassium level   4. Tobacco Use Disorder  -- Patient quit smoking 2 weeks ago, declines NicoDerm patch at this time, will follow  -- Smoking cessation encouraged  5. Group and Therapy: -- Encouraged patient to participate in unit milieu and in scheduled group therapies   --Substance Use counseling: Patient was counseled regarding need to abstain completely from alcohol marijuana and cocaine after discharge.  Patient is interested to go to inpatient residential rehab for substance use rehab after discharge if that is not successful he is in agreement with intensive outpatient rehab treatment, will follow.  6. Discharge Planning:    -- Social work and case management to assist with discharge planning and identification of hospital follow-up needs prior to discharge  -- Estimated LOS: 5-7 days  -- Discharge Concerns: Need to establish a safety plan; Medication compliance and effectiveness  -- Discharge Goals: Return home with outpatient referrals for mental health follow-up including medication management/psychotherapy   The patient is agreeable with the medication plan, as above. We will monitor the patient's response to pharmacologic treatment, and adjust medications as necessary. Patient is encouraged to participate in group therapy while admitted to the psychiatric unit. We will address other chronic and acute stressors, which contributed to the patient's worsening anxiety and depression, SI, in order to reduce the risk of self-harm at discharge.   Physician Treatment Plan for Primary Diagnosis: MDD (major depressive disorder), recurrent severe,  without psychosis (HCC) Long Term Goal(s): Improvement in symptoms so as ready for discharge  Short Term Goals: Ability to identify changes in lifestyle to reduce recurrence of condition will improve, Ability to verbalize feelings will improve, Ability to disclose and discuss suicidal ideas, Ability to demonstrate self-control will improve, and Ability to identify and develop effective coping behaviors will improve   I certify that inpatient services furnished can reasonably be expected to improve the patient's condition.    Total Time Spent in Direct Patient Care:  I personally spent 55 minutes on the unit in direct patient care. The direct patient care time included face-to-face time with the patient, reviewing the patient's chart, communicating with other professionals, and coordinating care. Greater than 50% of this time was spent in counseling or coordinating care with the patient regarding goals of hospitalization, psycho-education, and discharge planning needs.     Phill Steck Abbott Pao, MD 10/3/20242:14 PM

## 2022-11-11 NOTE — Progress Notes (Signed)
Patient's daughter, Dustine Stickler, [phone] called to request that provider and social worker give her a call about patient's treatment plan when available.

## 2022-11-11 NOTE — [facility] Group Notes (Signed)
Adult Psychoeducational Group Note  Date:  11/11/2022 Time:  8:41 PM  Group Topic/Focus:  Wrap-Up Group:   The focus of this group is to help patients review their daily goal of treatment and discuss progress on daily workbooks.  Participation Level:  Active  Participation Quality:  Appropriate  Affect:  Appropriate  Cognitive:  Appropriate  Insight: Appropriate  Engagement in Group:  Engaged  Modes of Intervention:  Activity, Discussion, and Support  Additional Comments:  Pt rated day a 10/10. Pt stated he was glad to be here and everyone has been great. Pt states goal is to walk away from here sober. Pt states his coping skills include being around people and feeling supported.   Sigrid Schwebach Katrinka Blazing 11/11/2022, 8:41 PM

## 2022-11-12 ENCOUNTER — Encounter (HOSPITAL_COMMUNITY): Payer: Self-pay

## 2022-11-12 DIAGNOSIS — F332 Major depressive disorder, recurrent severe without psychotic features: Secondary | ICD-10-CM | POA: Diagnosis not present

## 2022-11-12 LAB — COMPREHENSIVE METABOLIC PANEL
ALT: 28 U/L (ref 0–44)
AST: 18 U/L (ref 15–41)
Albumin: 3.8 g/dL (ref 3.5–5.0)
Alkaline Phosphatase: 87 U/L (ref 38–126)
Anion gap: 9 (ref 5–15)
BUN: 16 mg/dL (ref 6–20)
CO2: 25 mmol/L (ref 22–32)
Calcium: 9 mg/dL (ref 8.9–10.3)
Chloride: 104 mmol/L (ref 98–111)
Creatinine, Ser: 0.97 mg/dL (ref 0.61–1.24)
GFR, Estimated: >60 mL/min (ref >60)
Glucose, Bld: 120 mg/dL — ABNORMAL HIGH (ref 70–99)
Potassium: 3.7 mmol/L (ref 3.5–5.1)
Sodium: 138 mmol/L (ref 135–145)
Total Bilirubin: 0.6 mg/dL (ref 0.3–1.2)
Total Protein: 7.2 g/dL (ref 6.5–8.1)

## 2022-11-12 NOTE — Plan of Care (Signed)
  Problem: Education: Goal: Knowledge of [place] [facility] Education information/materials will improve Outcome: Progressing Goal: Emotional status will improve Outcome: Progressing Goal: Mental status will improve Outcome: Progressing Goal: Verbalization of understanding the information provided will improve Outcome: Progressing   

## 2022-11-12 NOTE — Progress Notes (Signed)
[facility] LCSW Progress Note   11/12/2022    3:57 PM  [name]      Type of Note: Residential Resources / Collateral with daughter   CSW provided pt with information to  [employer] and [employer].   CSW called pt daughter back and daughter shared that she did confirmed that her dad do get a check of 900$ , but he right now his account is overdrawn $500. Daughter did state that pt has burnt  a lot of bridges and she cannot help out financially. Daughter continued on saying that pt needs inpatient and not outpatient, CSW shared the resource options that patient has. Daughter was appreciative. CSW will continue to assist.      Signed:   Jacob Moores, MSW, [facility] 11/12/2022 3:57 PM

## 2022-11-12 NOTE — Group Note (Signed)
Date:  11/12/2022 Time:  10:47 AM  Group Topic/Focus:  Goals Group:   The focus of this group is to help patients establish daily goals to achieve during treatment and discuss how the patient can incorporate goal setting into their daily lives to aide in recovery.    Participation Level:  Did Not Attend  Participation Quality:  Appropriate  Affect:  Appropriate  Cognitive:  Appropriate  Insight: Appropriate  Engagement in Group:  Engaged  Modes of Intervention:  Discussion  Additional Comments:    Beckie Busing 11/12/2022, 10:47 AM

## 2022-11-12 NOTE — [facility] Group Notes (Signed)
[facility] Group Notes:  (Nursing/MHT/Case Management/Adjunct)  Date:  11/12/2022  Time:  10:16 PM  Type of Therapy:    Wrap Up Group    Participation Level:  Active  Participation Quality:  Appropriate  Affect:  Appropriate  Cognitive:  Appropriate  Insight:  Appropriate  Engagement in Group:  Developing/Improving  Modes of Intervention:  Discussion and Support  Summary of Progress/Problems: Pt Attended AA. [name] 11/12/2022, 10:16 PM

## 2022-11-12 NOTE — Group Note (Signed)
Recreation Therapy Group Note   Group Topic:Problem Solving  Group Date: 11/12/2022 Start Time: 0930 End Time: 0950 Facilitators: Palma Buster-McCall, LRT,CTRS Location: [address] Dayroom   Group Topic: Communication, Team Building, Problem Solving  Goal Area(s) Addresses:  Patient will effectively work with peer towards shared goal.  Patient will identify skills used to make activity successful.  Patient will identify how skills used during activity can be applied to reach post d/c goals.   Intervention: STEM Activity- Glass blower/designer  Group Description: [name], community. In teams of 5-6, patients were given 11 craft pipe cleaners. Using the materials provided, patients were instructed to compete again the opposing team(s) to build the tallest free-standing structure from floor level. The activity was timed; difficulty increased by Clinical research associate as Production designer, theatre/television/film continued.  Systematically resources were removed with additional directions for example, placing one arm behind their back, working in silence, and shape stipulations. LRT facilitated post-activity discussion reviewing team processes and necessary communication skills involved in completion. Patients were encouraged to reflect how the skills utilized, or not utilized, in this activity can be incorporated to positively impact support systems post discharge.  Education: Pharmacist, community, Scientist, physiological, Discharge Planning   Education Outcome: Acknowledges education/In group clarification offered/Needs additional education.    Affect/Mood: N/A   Participation Level: Did not attend    Clinical Observations/Individualized Feedback:     Plan: Continue to engage patient in RT group sessions 2-3x/week.   [name], LRT,CTRS  11/12/2022 11:26 AM

## 2022-11-12 NOTE — BH IP Treatment Plan (Signed)
Interdisciplinary Treatment and Diagnostic Plan Update  11/12/2022 Time of Session: 10:35 AM  [name] MRN: [id number]  Principal Diagnosis: MDD (major depressive disorder), recurrent severe, without psychosis (HCC)  Secondary Diagnoses: Principal Problem:   MDD (major depressive disorder), recurrent severe, without psychosis (HCC) Active Problems:   Moderate cocaine use disorder (HCC)   Alcohol dependence (HCC)   Current Medications:  Current Facility-Administered Medications  Medication Dose Route Frequency Provider Last Rate Last Admin   acetaminophen (TYLENOL) tablet 650 mg  650 mg Oral Q6H PRN Onuoha, Chinwendu V, NP   650 mg at 11/12/22 0810   alum & mag hydroxide-simeth (MAALOX/MYLANTA) 200-200-20 MG/5ML suspension 15 mL  15 mL Oral Q4H PRN Onuoha, Chinwendu V, NP   15 mL at 11/11/22 1154   amLODipine (NORVASC) tablet 10 mg  10 mg Oral Daily Attiah, Nadir, MD   10 mg at 11/12/22 0809   [START ON 11/13/2022] FLUoxetine (PROZAC) capsule 20 mg  20 mg Oral Daily Attiah, Nadir, MD       gabapentin (NEURONTIN) capsule 100 mg  100 mg Oral TID Abbott Pao, Nadir, MD   100 mg at 11/12/22 1253   hydrOXYzine (ATARAX) tablet 25 mg  25 mg Oral Q6H PRN Onuoha, Chinwendu V, NP       loperamide (IMODIUM) capsule 2-4 mg  2-4 mg Oral PRN Onuoha, Chinwendu V, NP       LORazepam (ATIVAN) tablet 2 mg  2 mg Oral TID PRN Onuoha, Chinwendu V, NP       Or   LORazepam (ATIVAN) injection 2 mg  2 mg Intramuscular TID PRN Onuoha, Chinwendu V, NP       LORazepam (ATIVAN) tablet 1 mg  1 mg Oral Q6H PRN Onuoha, Chinwendu V, NP       LORazepam (ATIVAN) tablet 1 mg  1 mg Oral TID [name], MD   1 mg at 11/12/22 1255   Followed by   Melene Muller ON 11/13/2022] LORazepam (ATIVAN) tablet 1 mg  1 mg Oral BID Massengill, Harrold Donath, MD       Followed by   Melene Muller ON 11/14/2022] LORazepam (ATIVAN) tablet 1 mg  1 mg Oral Daily Massengill, Harrold Donath, MD       magnesium hydroxide (MILK OF MAGNESIA) suspension 15 mL  15 mL  Oral Daily PRN Onuoha, Chinwendu V, NP       multivitamin with minerals tablet 1 tablet  1 tablet Oral Daily Onuoha, Chinwendu V, NP   1 tablet at 11/12/22 0809   ondansetron (ZOFRAN-ODT) disintegrating tablet 4 mg  4 mg Oral Q6H PRN Onuoha, Chinwendu V, NP       pantoprazole (PROTONIX) EC tablet 40 mg  40 mg Oral Daily Attiah, Nadir, MD   40 mg at 11/12/22 0809   thiamine (Vitamin B-1) tablet 100 mg  100 mg Oral Daily Onuoha, Chinwendu V, NP   100 mg at 11/12/22 0809   traZODone (DESYREL) tablet 50 mg  50 mg Oral QHS PRN Sarita Bottom, MD   50 mg at 11/11/22 2123   PTA Medications: Medications Prior to Admission  Medication Sig Dispense Refill Last Dose   acetaminophen (TYLENOL) 325 MG tablet Take 650 mg by mouth every 6 (six) hours as needed for moderate pain.      amLODipine (NORVASC) 10 MG tablet Take 1 tablet (10 mg total) by mouth daily. 90 tablet 1    azithromycin (ZITHROMAX) 250 MG tablet Take 250 mg by mouth daily.      cefdinir (OMNICEF) 300 MG  capsule Take 300 mg by mouth 2 (two) times daily.      Cyanocobalamin (VITAMIN B 12 PO) Take 1 tablet by mouth daily.      ipratropium-albuterol (DUONEB) 0.5-2.5 (3) MG/3ML SOLN Take 3 mLs by nebulization every 4 (four) hours as needed (wheezing, shortness of breath).      olmesartan (BENICAR) 20 MG tablet Take 1 tablet (20 mg total) by mouth daily. 90 tablet 1    pantoprazole (PROTONIX) 40 MG tablet Take 1 tablet (40 mg total) by mouth daily at 12 noon. 90 tablet 1    predniSONE (STERAPRED UNI-PAK 21 TAB) 5 MG (21) TBPK tablet Take 5 mg by mouth as directed.      VENTOLIN HFA 108 (90 Base) MCG/ACT inhaler Inhale 2 puffs into the lungs every 6 (six) hours as needed for wheezing or shortness of breath.       Patient Stressors: Substance abuse   Traumatic event    Patient Strengths: Supportive family/friends   Treatment Modalities: Medication Management, Group therapy, Case management,  1 to 1 session with clinician, Psychoeducation,  Recreational therapy.   Physician Treatment Plan for Primary Diagnosis: MDD (major depressive disorder), recurrent severe, without psychosis (HCC) Long Term Goal(s): Improvement in symptoms so as ready for discharge   Short Term Goals: Ability to identify changes in lifestyle to reduce recurrence of condition will improve Ability to verbalize feelings will improve Ability to disclose and discuss suicidal ideas Ability to demonstrate self-control will improve Ability to identify and develop effective coping behaviors will improve  Medication Management: Evaluate patient's response, side effects, and tolerance of medication regimen.  Therapeutic Interventions: 1 to 1 sessions, Unit Group sessions and Medication administration.  Evaluation of Outcomes: Not Progressing  Physician Treatment Plan for Secondary Diagnosis: Principal Problem:   MDD (major depressive disorder), recurrent severe, without psychosis (HCC) Active Problems:   Moderate cocaine use disorder (HCC)   Alcohol dependence (HCC)  Long Term Goal(s): Improvement in symptoms so as ready for discharge   Short Term Goals: Ability to identify changes in lifestyle to reduce recurrence of condition will improve Ability to verbalize feelings will improve Ability to disclose and discuss suicidal ideas Ability to demonstrate self-control will improve Ability to identify and develop effective coping behaviors will improve     Medication Management: Evaluate patient's response, side effects, and tolerance of medication regimen.  Therapeutic Interventions: 1 to 1 sessions, Unit Group sessions and Medication administration.  Evaluation of Outcomes: Not Progressing   RN Treatment Plan for Primary Diagnosis: MDD (major depressive disorder), recurrent severe, without psychosis (HCC) Long Term Goal(s): Knowledge of disease and therapeutic regimen to maintain health will improve  Short Term Goals: Ability to remain free from injury  will improve, Ability to verbalize frustration and anger appropriately will improve, Ability to demonstrate self-control, Ability to participate in decision making will improve, Ability to verbalize feelings will improve, Ability to disclose and discuss suicidal ideas, Ability to identify and develop effective coping behaviors will improve, and Compliance with prescribed medications will improve  Medication Management: RN will administer medications as ordered by provider, will assess and evaluate patient's response and provide education to patient for prescribed medication. RN will report any adverse and/or side effects to prescribing provider.  Therapeutic Interventions: 1 on 1 counseling sessions, Psychoeducation, Medication administration, Evaluate responses to treatment, Monitor vital signs and CBGs as ordered, Perform/monitor CIWA, COWS, AIMS and Fall Risk screenings as ordered, Perform wound care treatments as ordered.  Evaluation of Outcomes: Not Progressing  LCSW Treatment Plan for Primary Diagnosis: MDD (major depressive disorder), recurrent severe, without psychosis (HCC) Long Term Goal(s): Safe transition to appropriate next level of care at discharge, Engage patient in therapeutic group addressing interpersonal concerns.  Short Term Goals: Engage patient in aftercare planning with referrals and resources, Increase social support, Increase ability to appropriately verbalize feelings, Increase emotional regulation, Facilitate acceptance of mental health diagnosis and concerns, Facilitate patient progression through stages of change regarding substance use diagnoses and concerns, Identify triggers associated with mental health/substance abuse issues, and Increase skills for wellness and recovery  Therapeutic Interventions: Assess for all discharge needs, 1 to 1 time with Social worker, Explore available resources and support systems, Assess for adequacy in community support network, Educate  family and significant other(s) on suicide prevention, Complete Psychosocial Assessment, Interpersonal group therapy.  Evaluation of Outcomes: Not Progressing   Progress in Treatment: Attending groups: No. Participating in groups: No. Taking medication as prescribed: Yes. Toleration medication: Yes. Family/Significant other contact made: Yes, individual(s) contacted:  Edmon Magid [phone] Patient understands diagnosis: Yes. Discussing patient identified problems/goals with staff: Yes. Medical problems stabilized or resolved: Yes. Denies suicidal/homicidal ideation: Yes. Issues/concerns per patient self-inventory: No.   New problem(s) identified: No, Describe:  None reported   New Short Term/Long Term Goal(s):detox, medication management for mood stabilization; elimination of SI thoughts; development of comprehensive mental wellness/sobriety plan   Patient Goals:  " not to kill myself , stop drinking , and stop doing cocaine "   Discharge Plan or Barriers: Patient recently admitted. CSW will continue to follow and assess for appropriate referrals and possible discharge planning.    Reason for Continuation of Hospitalization: Depression Medication stabilization Suicidal ideation Withdrawal symptoms  Estimated Length of Stay: 5-7 days  Last 3 [place] Suicide Severity Risk Score: Flowsheet Row Ad[facility] (Current) [facility] from 11/10/2022 in [facility] Department at [facility] ED from 04/29/2022 in [facility] Emergency Department at [facility]  C-SSRS RISK CATEGORY High Risk High Risk No Risk       Last [facility] 2/9 Scores:    11/10/2022    2:17 PM 08/18/2022    8:25 AM 08/16/2022    2:36 PM  Depression screen PHQ 2/9  Decreased Interest 3 0 0  Down, Depressed, Hopeless 3 1 0  PHQ - 2 Score 6 1 0  Altered sleeping  1   Tired, decreased energy  1   Change in appetite  0   Feeling bad  or failure about yourself   0   Trouble concentrating  0   Moving slowly or fidgety/restless  0   Suicidal thoughts  0   PHQ-9 Score  3   Difficult doing work/chores  Somewhat difficult     Scribe for Treatment Team: Isabella Bowens, Theresia Majors 11/12/2022 1:36 PM

## 2022-11-12 NOTE — Progress Notes (Signed)
[name]. Is sleeping but arouses easily to voice. States. "I'm not feeling well" Pt has hx of CVA with no apparent deficits, HTN and substance abuse. He denies SOB or chest pain.He is calm and cooperative with clear speech. CIWA scores 5-will continue to safety checks and monitor for withdrawal symptoms. Pt admits to cocaine use and drinking 1/5 whiskey/day.  Denies SI/HI/AVH currently and voices no complaint at this time

## 2022-11-12 NOTE — Plan of Care (Signed)
  Problem: Education: Goal: Knowledge of [place] [facility] Education information/materials will improve Outcome: Progressing   Problem: Education: Goal: Emotional status will improve Outcome: Not Progressing Goal: Mental status will improve Outcome: Not Progressing Goal: Verbalization of understanding the information provided will improve Outcome: Not Progressing   Problem: Activity: Goal: Interest or engagement in activities will improve Outcome: Not Progressing

## 2022-11-12 NOTE — Progress Notes (Signed)
[facility] MD Progress Note  11/12/2022 11:08 AM [name]  MRN:  [id number]   Reason for Admission:  [name] is a 57 y.o., male with a past psychiatric history significant for alcohol use disorder severe dependence, cocaine use disorder who presents to the [facility] from [facility] emergency department for evaluation and management of worsening depression, SI, status post suicide attempt by overdose on 10 tablets of blood pressure medication. The patient is currently on Hospital Day 1.   Chart Review from last 24 hours:  The patient's chart was reviewed and nursing notes were reviewed. The patient's case was discussed in multidisciplinary team meeting. Per [facility] patient is compliant with scheduled medications on the unit including Ativan taper for alcohol withdrawals, as needed Tylenol being used for headache, as needed trazodone given 10/3 at night.  CIWA score 8 1 10/4 in the morning  Information Obtained Today During Patient Interview: The patient was seen and evaluated on the unit. On assessment today the patient reports had interrupted sleep secondary to his roommate snoring loudly, he reports not feeling well this morning complains of withdrawal symptoms including nausea and headache as well as anxiety and tremors this report was given before him receiving Ativan scheduled dosing for this morning, symptoms significantly improved after Ativan given.  Patient continues to report feeling depressed and anxious continues to report feeling hopeless and helpless but denies passive or active SI intention or plan denies HI or AVH, denies side effect to current medication regimen and agrees to continue with compliance.  Continues to report interest for inpatient residential rehab.  Collateral information to be obtained from patient's daughter [name] at [phone] to address gut axis as well as discharge planning if he does not end up going directly from here to inpatient  residential rehab.   Sleep  Sleep:Sleep: Poor Number of Hours of Sleep: 4  Sleep remains poor and interrupted questionable if this is only secondary to roommate snoring, will follow after patient's transfer to another room  Principal Problem: MDD (major depressive disorder), recurrent severe, without psychosis (HCC) Diagnosis: Principal Problem:   MDD (major depressive disorder), recurrent severe, without psychosis (HCC) Active Problems:   Moderate cocaine use disorder (HCC)   Alcohol dependence (HCC)    Past Psychiatric History:  Prior Psychiatric diagnoses: none Past Psychiatric Hospitalizations: once 20 yrs ago after cut his wrist in suicide attempt   History of self mutilation: denies Past suicide attempts: x 2, 20 yrs ago cut his wrist, 1 and 1/2 yrs ago by OD on fentanyl Past history of HI, violent or aggressive behavior: denies   Past Psychiatric medications trials: none History of ECT/TMS: denies   Outpatient psychiatric Follow up: denies Prior Outpatient Therapy: denies  Past Medical History:  Past Medical History:  Diagnosis Date   Acid reflux    Emphysema of lung (HCC)    Head injury    Hepatitis C    S/p treatment with Epclusa in 2020   Hiatal hernia    HTN (hypertension)    Pulmonary nodules    Stroke [facility])     Past Surgical History:  Procedure Laterality Date   ACROMIO-CLAVICULAR JOINT REPAIR Right 06/28/2018   Procedure: ACROMIO-CLAVICULAR JOINT IRRIGATION AND DEBRIDEMENT;  Surgeon: Cammy Copa, MD;  Location: [facility] OR;  Service: Orthopedics;  Laterality: Right;   FACIAL FRACTURE SURGERY     IRRIGATION AND DEBRIDEMENT SHOULDER Right 06/28/2018   Procedure: IRRIGATION AND DEBRIDEMENT SHOULDER;  Surgeon: Cammy Copa, MD;  Location:  [facility] OR;  Service: Orthopedics;  Laterality: Right;   KNEE ARTHROSCOPY Left 06/23/2018   Procedure: LEFT KNEE ARTHROSCOPY KNEE I&D.;  Surgeon: Cammy Copa, MD;  Location: [facility] OR;  Service: Orthopedics;   Laterality: Left;   Family History:  Family History  Problem Relation Age of Onset   Diabetes Mother    Hypertension Mother    Stroke Father    Hypertension Father    Colon cancer Neg Hx    Family Psychiatric  History:  Psychiatric illness: denies Suicide: has a notable family history of suicide, with both his maternal grandfather and paternal uncle having taken their own lives.  Substance Abuse: multiple male family members with alcohol dependence Social History:  Living situation: Was living with his girlfriend in [place] but will not be going back there "my daughter is trying to find me a place" Social support: His oldest daughter [name] is supportive, she lives in [place] Marital Status: Was married once for about 20 years and got divorced 22 years ago Children: 6 children 2 sons and 4 daughters ages 27 years old to 17 years old, he only has relationship with his oldest daughter [name] who is supportive Education: 10th grade Employment: Currently on disability for the past year for medical reasons Military service: Denies Legal history: Has been to jail and the present several times last time to jail 3 years ago last time to prison 20 years ago for "running guns" has court on October 9 for DUI Trauma: Denies Access to guns: Has guns at girlfriend's house for hunting but he does not plan to go back there  Current Medications: Current Facility-Administered Medications  Medication Dose Route Frequency Provider Last Rate Last Admin   acetaminophen (TYLENOL) tablet 650 mg  650 mg Oral Q6H PRN Onuoha, Chinwendu V, NP   650 mg at 11/12/22 0810   alum & mag hydroxide-simeth (MAALOX/MYLANTA) 200-200-20 MG/5ML suspension 15 mL  15 mL Oral Q4H PRN Onuoha, Chinwendu V, NP   15 mL at 11/11/22 1154   amLODipine (NORVASC) tablet 10 mg  10 mg Oral Daily Cory Kitt, MD   10 mg at 11/12/22 0809   [START ON 11/13/2022] FLUoxetine (PROZAC) capsule 20 mg  20 mg Oral Daily Skipper Dacosta, MD        gabapentin (NEURONTIN) capsule 100 mg  100 mg Oral TID Abbott Pao, Aldonia Keeven, MD   100 mg at 11/12/22 0809   hydrOXYzine (ATARAX) tablet 25 mg  25 mg Oral Q6H PRN Onuoha, Chinwendu V, NP       loperamide (IMODIUM) capsule 2-4 mg  2-4 mg Oral PRN Onuoha, Chinwendu V, NP       LORazepam (ATIVAN) tablet 2 mg  2 mg Oral TID PRN Onuoha, Chinwendu V, NP       Or   LORazepam (ATIVAN) injection 2 mg  2 mg Intramuscular TID PRN Onuoha, Chinwendu V, NP       LORazepam (ATIVAN) tablet 1 mg  1 mg Oral Q6H PRN Onuoha, Chinwendu V, NP       LORazepam (ATIVAN) tablet 1 mg  1 mg Oral TID Massengill, Harrold Donath, MD   1 mg at 11/12/22 0809   Followed by   Melene Muller ON 11/13/2022] LORazepam (ATIVAN) tablet 1 mg  1 mg Oral BID Massengill, Nathan, MD       Followed by   Melene Muller ON 11/14/2022] LORazepam (ATIVAN) tablet 1 mg  1 mg Oral Daily Massengill, Nathan, MD       magnesium hydroxide (MILK OF MAGNESIA)  suspension 15 mL  15 mL Oral Daily PRN Onuoha, Chinwendu V, NP       multivitamin with minerals tablet 1 tablet  1 tablet Oral Daily Onuoha, Chinwendu V, NP   1 tablet at 11/12/22 0809   ondansetron (ZOFRAN-ODT) disintegrating tablet 4 mg  4 mg Oral Q6H PRN Onuoha, Chinwendu V, NP       pantoprazole (PROTONIX) EC tablet 40 mg  40 mg Oral Daily Zanae Kuehnle, MD   40 mg at 11/12/22 0809   thiamine (Vitamin B-1) tablet 100 mg  100 mg Oral Daily Onuoha, Chinwendu V, NP   100 mg at 11/12/22 0809   traZODone (DESYREL) tablet 50 mg  50 mg Oral QHS PRN Sarita Bottom, MD   50 mg at 11/11/22 2123    Lab Results:  Results for orders placed or performed during the hospital encounter of 11/11/22 (from the past 48 hour(s))  Comprehensive metabolic panel     Status: Abnormal   Collection Time: 11/12/22  6:40 AM  Result Value Ref Range   Sodium 138 135 - 145 mmol/L   Potassium 3.7 3.5 - 5.1 mmol/L   Chloride 104 98 - 111 mmol/L   CO2 25 22 - 32 mmol/L   Glucose, Bld 120 (H) 70 - 99 mg/dL    Comment: Glucose reference range applies  only to samples taken after fasting for at least 8 hours.   BUN 16 6 - 20 mg/dL   Creatinine, Ser 1.61 0.61 - 1.24 mg/dL   Calcium 9.0 8.9 - 09.6 mg/dL   Total Protein 7.2 6.5 - 8.1 g/dL   Albumin 3.8 3.5 - 5.0 g/dL   AST 18 15 - 41 U/L   ALT 28 0 - 44 U/L   Alkaline Phosphatase 87 38 - 126 U/L   Total Bilirubin 0.6 0.3 - 1.2 mg/dL   GFR, Estimated >04 >54 mL/min    Comment: (NOTE) Calculated using the CKD-EPI Creatinine Equation (2021)    Anion gap 9 5 - 15    Comment: Performed at [facility], [address] [place], [place] [address]    Blood Alcohol level:  Lab Results  Component Value Date   [facility] <10 11/10/2022   ETH <10 12/30/2021    Metabolic Disorder Labs: Lab Results  Component Value Date   HGBA1C 5.5 10/22/2021   MPG 111.15 10/22/2021   MPG 117 (H) 12/17/2013   No results found for: "PROLACTIN" Lab Results  Component Value Date   CHOL 161 10/22/2021   TRIG 238 (H) 10/22/2021   HDL 16 (L) 10/22/2021   CHOLHDL 10.1 10/22/2021   VLDL 48 (H) 10/22/2021   LDLCALC 97 10/22/2021   LDLCALC 109 (H) 12/17/2013    Physical Findings: AIMS:  , ,  ,  ,    CIWA:  CIWA-Ar Total: 8 COWS:     Musculoskeletal: Strength & Muscle Tone: within normal limits Gait & Station: normal Patient leans: N/A  Psychiatric Specialty Exam:  General Appearance: Appears older than stated age, disheveled, dressed in hospital gown, below average hygiene   Behavior: Calm and cooperative   Psychomotor Activity: No agitation noted, mild psychomotor retardation noted   Eye Contact: Limited Speech: Decreased amount, decreased tone and volume     Mood: Moderately dysphoric Affect: Restricted sad affect   Thought Process: Linear and goal-directed Descriptions of Associations: Concrete Thought Content: Hallucinations: Denies AH, VH  Delusions: No paranoia  Suicidal Thoughts: Reports passive SI wishing self dead, denies active SI, intention,  plan  Homicidal  Thoughts: Denies HI, intention, plan    Alertness/Orientation: Alert and oriented   Insight: poor Judgment: poor   Memory: Intact, slightly limited   Executive Functions  Concentration: Fair Attention Span: fair Recall: [employer] of Knowledge: Fair  Assets  Assets: Communication Skills    Physical Exam: Physical Exam Vitals and nursing note reviewed.    Review of Systems  Gastrointestinal:  Positive for nausea.  Neurological:  Positive for headaches.  Psychiatric/Behavioral:  Positive for depression. The patient is nervous/anxious.   All other systems reviewed and are negative.  Blood pressure (!) 114/90, pulse 97, temperature (!) 97.5 F (36.4 C), temperature source Oral, resp. rate 18, height 5\' 10"  (1.778 m), weight 81.2 kg, SpO2 96%. Body mass index is 25.68 kg/m.   Treatment Plan Summary: Daily contact with patient to assess and evaluate symptoms and progress in treatment and Medication management  ASSESSMENT:  Diagnoses / Active Problems: Principal Problem: MDD (major depressive disorder), recurrent severe, without psychosis (HCC) Diagnosis: Principal Problem:   MDD (major depressive disorder), recurrent severe, without psychosis (HCC) Active Problems:   Moderate cocaine use disorder (HCC)   Alcohol dependence (HCC)   PLAN: Safety and Monitoring:  -- Voluntary admission to inpatient psychiatric unit for safety, stabilization and treatment  -- Daily contact with patient to assess and evaluate symptoms and progress in treatment  -- Patient's case to be discussed in multi-disciplinary team meeting  -- Observation Level : q15 minute checks  -- Vital signs:  q12 hours  -- Precautions: suicide, elopement, and assault  Medications:              Continue Prozac 10 mg daily then titrated to 20 mg daily for depression and long-term treatment of anxiety             Continue gabapentin 100 mg 3 times daily for alcohol dependence and craving as well as chronic  pain, also will help with mood and anxiety             Continue trazodone 50 mg at bedtime as needed for sleep             Continue Vistaril 25 mg 3 times daily as needed for anxiety             Continue Ativan taper for alcohol withdrawals             CIWA protocol for alcohol withdrawals and follow-up            Continue pantoprazole 40 mg daily, home medication for GERD           Continue Norvasc 10 mg daily for hypertension, home medication             Patient reports being on Percocet prior to admission for chronic pain but per PDMP patient was prescribed oxycodone back in March for only few days, given substance use disorder and alcohol dependence would not start opiates at this time, he is on Tylenol as needed for pain in addition to gabapentin for chronic pain long-term treatment. -- The risks/benefits/side-effects/alternatives to this medication were discussed in detail with the patient and time was given for questions. The patient consents to medication trial.                            3. Labs Reviewed: CBC within normal level, CMP within normal level except for low potassium 3.0 received potassium in the emergency  room, alcohol level less than 10, UDS positive for cocaine and marijuana CMP 10/4 potassium 3.7 normal      Lab ordered: None   4. Tobacco Use Disorder             -- Patient quit smoking 2 weeks ago, declines NicoDerm patch at this time, will follow             -- Smoking cessation encouraged   5. Group and Therapy: -- Encouraged patient to participate in unit milieu and in scheduled group therapies              --Substance Use counseling: Patient was counseled regarding need to abstain completely from alcohol marijuana and cocaine after discharge.   Patient is interested to go to inpatient residential rehab for substance use rehab after discharge if that is not successful he is in agreement with intensive outpatient rehab treatment, will follow.   6. Discharge  Planning:              -- Social work and case management to assist with discharge planning and identification of hospital follow-up needs prior to discharge             -- Estimated LOS: 5-7 days             -- Discharge Concerns: Need to establish a safety plan; Medication compliance and effectiveness             -- Discharge Goals: Return home with outpatient referrals for mental health follow-up including medication management/psychotherapy     The patient is agreeable with the medication plan, as above. We will monitor the patient's response to pharmacologic treatment, and adjust medications as necessary. Patient is encouraged to participate in group therapy while admitted to the psychiatric unit. We will address other chronic and acute stressors, which contributed to the patient's worsening anxiety and depression, SI, in order to reduce the risk of self-harm at discharge.     Physician Treatment Plan for Primary Diagnosis: MDD (major depressive disorder), recurrent severe, without psychosis (HCC) Long Term Goal(s): Improvement in symptoms so as ready for discharge   Short Term Goals: Ability to identify changes in lifestyle to reduce recurrence of condition will improve, Ability to verbalize feelings will improve, Ability to disclose and discuss suicidal ideas, Ability to demonstrate self-control will improve, and Ability to identify and develop effective coping behaviors will improve     I certify that inpatient services furnished can reasonably be expected to improve the patient's condition.      Total Time Spent in Direct Patient Care:  I personally spent 35 minutes on the unit in direct patient care. The direct patient care time included face-to-face time with the patient, reviewing the patient's chart, communicating with other professionals, and coordinating care. Greater than 50% of this time was spent in counseling or coordinating care with the patient regarding goals of  hospitalization, psycho-education, and discharge planning needs.   Efe Fazzino Abbott Pao, MD 11/12/2022, 11:08 AM

## 2022-11-12 NOTE — [facility] Counselor (Signed)
Adult Comprehensive Assessment  Patient ID: [name], male   DOB: 25-Mar-1965, 57 y.o.   MRN: [id number]  Information Source: Information source: Patient  Current Stressors:  Patient states their primary concerns and needs for treatment are:: "to go to rehab" Patient states their goals for this hospitilization and ongoing recovery are:: "I am homeless and I use alcohol and cocaine." Educational / Learning stressors: denies Employment / Job issues: unemployed Family Relationships: poor Surveyor, quantity / Lack of resources (include bankruptcy): "I get $920 for disability Housing / Lack of housing: homeless for the past few days Physical health (include injuries & life threatening diseases): "I don't know." Social relationships: none Substance abuse: "I use alcohol and cocaine." Bereavement / Loss: loss of independence  Living/Environment/Situation:  Living Arrangements: Alone Who else lives in the home?: homeless How long has patient lived in current situation?: 2 days  Family History:  Marital status: Single Are you sexually active?: No What is your sexual orientation?: straight Does patient have children?: Yes How many children?: 6 How is patient's relationship with their children?: "ok"  Childhood History:  Additional childhood history information: "I don't know." Description of patient's relationship with caregiver when they were a child: "I don't know." Patient's description of current relationship with people who raised him/her: "I don't know." How were you disciplined when you got in trouble as a child/adolescent?: "I don't know." Does patient have siblings?: No Did patient suffer any verbal/emotional/physical/sexual abuse as a child?: No Did patient suffer from severe childhood neglect?: No Has patient ever been sexually abused/assaulted/raped as an adolescent or adult?: No Was the patient ever a victim of a crime or a disaster?: No Witnessed domestic violence?: No Has  patient been affected by domestic violence as an adult?: No  Education:  Highest grade of school patient has completed: "I am not sure" Currently a student?: No Learning disability?: No  Employment/Work Situation:   Employment Situation: On disability Why is Patient on Disability: "my health" How Long has Patient Been on Disability: "years" Patient's Job has Been Impacted by Current Illness: No Has Patient ever Been in the [employer]?: No  Financial Resources:   Financial resources: Insurance claims handler Does patient have a Lawyer or guardian?: No  Alcohol/Substance Abuse:   What has been your use of drugs/alcohol within the last 12 months?: "I use" If attempted suicide, did drugs/alcohol play a role in this?: No Alcohol/Substance Abuse Treatment Hx: Denies past history Has alcohol/substance abuse ever caused legal problems?: No  Social Support System:   Forensic psychologist System: None Type of faith/religion: none How does patient's faith help to cope with current illness?: "I don't"  Leisure/Recreation:   Do You Have Hobbies?: No  Strengths/Needs:   What is the patient's perception of their strengths?: did not respond Patient states they can use these personal strengths during their treatment to contribute to their recovery: did not respond Patient states these barriers may affect/interfere with their treatment: did not respond Patient states these barriers may affect their return to the community: did not respond Other important information patient would like considered in planning for their treatment: did not respond  Discharge Plan:   Currently receiving community mental health services: No Patient states concerns and preferences for aftercare planning are: having somewhere to live Patient states they will know when they are safe and ready for discharge when: I can go somewhere safe Does patient have access to transportation?: No Does patient have  financial barriers related to discharge medications?: No Will  patient be returning to same living situation after discharge?: No  Summary/Recommendations:   Summary and Recommendations (to be completed by the evaluator): [name] is a 57 yr old man that was admitted into [facility] on 11/11/22.  He has a significant history for alcohol use disorder severe dependence, cocaine use disorder He was admitted for worsening depression, SI, status post suicide attempt by overdose on 10 tablets of blood pressure medication.  He reports that his depression continues to worsen over the past few months with decreased sleep decreased energy and motivation decreased interest, concentration, reports fair appetite, reports feeling hopeless helpless and worthless. While here, Darran can benefit from crisis stabilization, medication management, therapeutic milieu, and referrals for services.   Marinda Elk. 11/12/2022

## 2022-11-12 NOTE — [facility] Suicide Risk Assessment (Signed)
[facility] INPATIENT:  Family/Significant Other Suicide Prevention Education  Suicide Prevention Education:  Education Completed; Herby Abraham,  (daughter) has been identified by the patient as the family member/significant other with whom the patient will be residing, and identified as the person(s) who will aid the patient in the event of a mental health crisis (suicidal ideations/suicide attempt).  With written consent from the patient, the family member/significant other has been provided the following suicide prevention education, prior to the and/or following the discharge of the patient.  The suicide prevention education provided includes the following: Suicide risk factors Suicide prevention and interventions National Suicide Hotline telephone number [place] assessment telephone number [facility] Emergency Assistance 911 [facility] and/or Residential Mobile Crisis Unit telephone number  Request made of family/significant other to: Remove weapons (e.g., guns, rifles, knives), all items previously/currently identified as safety concern.   Remove drugs/medications (over-the-counter, prescriptions, illicit drugs), all items previously/currently identified as a safety concern.  The family member/significant other verbalizes understanding of the suicide prevention education information provided.  The family member/significant other agrees to remove the items of safety concern listed above.  Jodelle Gross Jessey Huyett 11/12/2022, 12:09 PM

## 2022-11-13 DIAGNOSIS — F332 Major depressive disorder, recurrent severe without psychotic features: Secondary | ICD-10-CM | POA: Diagnosis not present

## 2022-11-13 MED ORDER — LORAZEPAM 1 MG PO TABS
1.0000 mg | ORAL_TABLET | Freq: Three times a day (TID) | ORAL | Status: AC
  Administered 2022-11-13 (×2): 1 mg via ORAL
  Filled 2022-11-13 (×2): qty 1

## 2022-11-13 MED ORDER — LORAZEPAM 0.5 MG PO TABS
0.5000 mg | ORAL_TABLET | Freq: Two times a day (BID) | ORAL | Status: AC
  Administered 2022-11-15 (×2): 0.5 mg via ORAL
  Filled 2022-11-13 (×2): qty 1

## 2022-11-13 MED ORDER — GABAPENTIN 100 MG PO CAPS
200.0000 mg | ORAL_CAPSULE | Freq: Three times a day (TID) | ORAL | Status: DC
  Administered 2022-11-13 – 2022-11-22 (×27): 200 mg via ORAL
  Filled 2022-11-13 (×37): qty 2

## 2022-11-13 MED ORDER — CEPHALEXIN 500 MG PO CAPS
500.0000 mg | ORAL_CAPSULE | Freq: Two times a day (BID) | ORAL | Status: AC
  Administered 2022-11-13 – 2022-11-19 (×14): 500 mg via ORAL
  Filled 2022-11-13 (×17): qty 1

## 2022-11-13 MED ORDER — LORAZEPAM 1 MG PO TABS
1.0000 mg | ORAL_TABLET | Freq: Two times a day (BID) | ORAL | Status: AC
  Administered 2022-11-14 (×2): 1 mg via ORAL
  Filled 2022-11-13 (×2): qty 1

## 2022-11-13 NOTE — Progress Notes (Signed)
   11/12/22 2000  Psych Admission Type (Psych Patients Only)  Admission Status Voluntary  Psychosocial Assessment  Patient Complaints Anxiety;Depression  Eye Contact Brief  Facial Expression Anxious  Affect Anxious  Speech Logical/coherent  Interaction Assertive  Motor Activity Slow  Appearance/Hygiene In scrubs  Behavior Characteristics Cooperative;Appropriate to situation  Mood Sad  Thought Process  Coherency WDL  Content WDL  Delusions None reported or observed  Perception WDL  Hallucination None reported or observed  Judgment Impaired  Confusion None  Danger to Self  Current suicidal ideation? Denies  Agreement Not to Harm Self No  Description of Agreement Verbal  Danger to Others  Danger to Others None reported or observed   Remained stable throughout the night. No changes in assessment from earlier evening assessment .

## 2022-11-13 NOTE — Progress Notes (Signed)
   11/13/22 0527  15 Minute Checks  Location Bedroom  Visual Appearance Calm  Behavior Sleeping  Sleep (Behavioral Health Patients Only)  Calculate sleep? (Click Yes once per 24 hr at 0600 safety check) Yes  Documented sleep last 24 hours 8.75

## 2022-11-13 NOTE — Group Note (Signed)
Date:  11/13/2022 Time:  5:06 PM  Group Topic/Focus:  Wellness Toolbox:   The focus of this group is to discuss various aspects of wellness, balancing those aspects and exploring ways to increase the ability to experience wellness.  Patients will create a wellness toolbox for use upon discharge.    Participation Level:  Active  Participation Quality:  Appropriate  Affect:  Appropriate  Cognitive:  Appropriate  Insight: Appropriate  Engagement in Group:  Engaged  Modes of Intervention:  Exploration  Additional Comments:     Reymundo Poll 11/13/2022, 5:06 PM

## 2022-11-13 NOTE — Progress Notes (Signed)
[facility] MD Progress Note  11/13/2022 11:38 AM [name]  MRN:  [id number]   Reason for Admission:  [name] is a 57 y.o., male with a past psychiatric history significant for alcohol use disorder severe dependence, cocaine use disorder who presents to the [facility] from [facility] emergency department for evaluation and management of worsening depression, SI, status post suicide attempt by overdose on 10 tablets of blood pressure medication. The patient is currently on Hospital Day 2.   Chart Review from last 24 hours:  The patient's chart was reviewed and nursing notes were reviewed. The patient's case was discussed in multidisciplinary team meeting. Per [facility] patient is compliant with scheduled medications on the unit including Ativan taper for alcohol withdrawals, as needed Tylenol being used for headache, as needed trazodone given 10/3 at night and 10/4.  CIWA score 7 on 10/5 at 8 AM Information Obtained Today During Patient Interview: The patient was seen and evaluated on the unit. On assessment today patient reports better sleep last night but continues to feel depressed and anxious he complains of tremors as well as nausea and headache probably related to ongoing withdrawals, CIWA score slowly improving.  He remains on Ativan taper.  He continues to report feeling unsafe outside the hospital "I would probably kill myself" he is very tearful and anxious during evaluation, he denies any passive or active suicidal ideation intention or plan while in the hospital, he denies HI or AVH.  Denies tactile hallucinations.  He is attending some groups.  He reports pain in left finger and upon exam it was noted to be swollen and red he reports it started for 2 days, discussed starting antibiotics. He reports fair appetite, denies symptoms consistent with mania or hypomania.   I attempted to contact patient's daughter [name] at [phone] for collateral information and to address  guns access and discharge planning but no response.   Sleep  Improved   Principal Problem: MDD (major depressive disorder), recurrent severe, without psychosis (HCC) Diagnosis: Principal Problem:   MDD (major depressive disorder), recurrent severe, without psychosis (HCC) Active Problems:   Moderate cocaine use disorder (HCC)   Alcohol dependence (HCC)    Past Psychiatric History:  Prior Psychiatric diagnoses: none Past Psychiatric Hospitalizations: once 20 yrs ago after cut his wrist in suicide attempt   History of self mutilation: denies Past suicide attempts: x 2, 20 yrs ago cut his wrist, 1 and 1/2 yrs ago by OD on fentanyl Past history of HI, violent or aggressive behavior: denies   Past Psychiatric medications trials: none History of ECT/TMS: denies   Outpatient psychiatric Follow up: denies Prior Outpatient Therapy: denies  Past Medical History:  Past Medical History:  Diagnosis Date   Acid reflux    Emphysema of lung (HCC)    Head injury    Hepatitis C    S/p treatment with Epclusa in 2020   Hiatal hernia    HTN (hypertension)    Pulmonary nodules    Stroke [facility])     Past Surgical History:  Procedure Laterality Date   ACROMIO-CLAVICULAR JOINT REPAIR Right 06/28/2018   Procedure: ACROMIO-CLAVICULAR JOINT IRRIGATION AND DEBRIDEMENT;  Surgeon: Cammy Copa, MD;  Location: [name] OR;  Service: Orthopedics;  Laterality: Right;   FACIAL FRACTURE SURGERY     IRRIGATION AND DEBRIDEMENT SHOULDER Right 06/28/2018   Procedure: IRRIGATION AND DEBRIDEMENT SHOULDER;  Surgeon: Cammy Copa, MD;  Location: [facility] OR;  Service: Orthopedics;  Laterality: Right;   KNEE  ARTHROSCOPY Left 06/23/2018   Procedure: LEFT KNEE ARTHROSCOPY KNEE I&D.;  Surgeon: Cammy Copa, MD;  Location: [facility] OR;  Service: Orthopedics;  Laterality: Left;   Family History:  Family History  Problem Relation Age of Onset   Diabetes Mother    Hypertension Mother    Stroke Father     Hypertension Father    Colon cancer Neg Hx    Family Psychiatric  History:  Psychiatric illness: denies Suicide: has a notable family history of suicide, with both his maternal grandfather and paternal uncle having taken their own lives.  Substance Abuse: multiple male family members with alcohol dependence Social History:  Living situation: Was living with his girlfriend in [place] but will not be going back there "my daughter is trying to find me a place" Social support: His oldest daughter [name] is supportive, she lives in [place] Marital Status: Was married once for about 20 years and got divorced 22 years ago Children: 6 children 2 sons and 4 daughters ages 38 years old to 9 years old, he only has relationship with his oldest daughter [name] who is supportive Education: 10th grade Employment: Currently on disability for the past year for medical reasons Military service: Denies Legal history: Has been to jail and the present several times last time to jail 3 years ago last time to prison 20 years ago for "running guns" has court on October 9 for DUI Trauma: Denies Access to guns: Has guns at girlfriend's house for hunting but he does not plan to go back there  Current Medications: Current Facility-Administered Medications  Medication Dose Route Frequency Provider Last Rate Last Admin   acetaminophen (TYLENOL) tablet 650 mg  650 mg Oral Q6H PRN Onuoha, Chinwendu V, NP   650 mg at 11/12/22 2025   alum & mag hydroxide-simeth (MAALOX/MYLANTA) 200-200-20 MG/5ML suspension 15 mL  15 mL Oral Q4H PRN Onuoha, Chinwendu V, NP   15 mL at 11/11/22 1154   amLODipine (NORVASC) tablet 10 mg  10 mg Oral Daily Abbott Pao, Adisa Litt, MD   10 mg at 11/13/22 0855   cephALEXin (KEFLEX) capsule 500 mg  500 mg Oral Q12H Tyliek Timberman, MD       FLUoxetine (PROZAC) capsule 20 mg  20 mg Oral Daily Brendan Gruwell, MD   20 mg at 11/13/22 0856   gabapentin (NEURONTIN) capsule 100 mg  100 mg Oral TID Sarita Bottom,  MD   100 mg at 11/13/22 0856   hydrOXYzine (ATARAX) tablet 25 mg  25 mg Oral Q6H PRN Onuoha, Chinwendu V, NP       loperamide (IMODIUM) capsule 2-4 mg  2-4 mg Oral PRN Onuoha, Chinwendu V, NP       LORazepam (ATIVAN) tablet 2 mg  2 mg Oral TID PRN Onuoha, Chinwendu V, NP       Or   LORazepam (ATIVAN) injection 2 mg  2 mg Intramuscular TID PRN Onuoha, Chinwendu V, NP       LORazepam (ATIVAN) tablet 1 mg  1 mg Oral Q6H PRN Onuoha, Chinwendu V, NP       LORazepam (ATIVAN) tablet 1 mg  1 mg Oral BID Massengill, Nathan, MD   1 mg at 11/13/22 0855   Followed by   Melene Muller ON 11/14/2022] LORazepam (ATIVAN) tablet 1 mg  1 mg Oral Daily Massengill, Nathan, MD       magnesium hydroxide (MILK OF MAGNESIA) suspension 15 mL  15 mL Oral Daily PRN Onuoha, Chinwendu V, NP  multivitamin with minerals tablet 1 tablet  1 tablet Oral Daily Onuoha, Chinwendu V, NP   1 tablet at 11/13/22 0856   ondansetron (ZOFRAN-ODT) disintegrating tablet 4 mg  4 mg Oral Q6H PRN Onuoha, Chinwendu V, NP   4 mg at 11/13/22 0805   pantoprazole (PROTONIX) EC tablet 40 mg  40 mg Oral Daily Sunset Joshi, MD   40 mg at 11/13/22 0855   thiamine (Vitamin B-1) tablet 100 mg  100 mg Oral Daily Onuoha, Chinwendu V, NP   100 mg at 11/13/22 0856   traZODone (DESYREL) tablet 50 mg  50 mg Oral QHS PRN Sarita Bottom, MD   50 mg at 11/12/22 2121    Lab Results:  Results for orders placed or performed during the hospital encounter of 11/11/22 (from the past 48 hour(s))  Comprehensive metabolic panel     Status: Abnormal   Collection Time: 11/12/22  6:40 AM  Result Value Ref Range   Sodium 138 135 - 145 mmol/L   Potassium 3.7 3.5 - 5.1 mmol/L   Chloride 104 98 - 111 mmol/L   CO2 25 22 - 32 mmol/L   Glucose, Bld 120 (H) 70 - 99 mg/dL    Comment: Glucose reference range applies only to samples taken after fasting for at least 8 hours.   BUN 16 6 - 20 mg/dL   Creatinine, Ser 1.61 0.61 - 1.24 mg/dL   Calcium 9.0 8.9 - 09.6 mg/dL   Total  Protein 7.2 6.5 - 8.1 g/dL   Albumin 3.8 3.5 - 5.0 g/dL   AST 18 15 - 41 U/L   ALT 28 0 - 44 U/L   Alkaline Phosphatase 87 38 - 126 U/L   Total Bilirubin 0.6 0.3 - 1.2 mg/dL   GFR, Estimated >04 >54 mL/min    Comment: (NOTE) Calculated using the CKD-EPI Creatinine Equation (2021)    Anion gap 9 5 - 15    Comment: Performed at [facility], [address], [place], [place] [address]    Blood Alcohol level:  Lab Results  Component Value Date   [facility] <10 11/10/2022   ETH <10 12/30/2021    Metabolic Disorder Labs: Lab Results  Component Value Date   HGBA1C 5.5 10/22/2021   MPG 111.15 10/22/2021   MPG 117 (H) 12/17/2013   No results found for: "PROLACTIN" Lab Results  Component Value Date   CHOL 161 10/22/2021   TRIG 238 (H) 10/22/2021   HDL 16 (L) 10/22/2021   CHOLHDL 10.1 10/22/2021   VLDL 48 (H) 10/22/2021   LDLCALC 97 10/22/2021   LDLCALC 109 (H) 12/17/2013    Physical Findings: AIMS:  , ,  ,  ,    CIWA:  CIWA-Ar Total: 7 COWS:     Musculoskeletal: Strength & Muscle Tone: within normal limits Gait & Station: normal Patient leans: N/A  Psychiatric Specialty Exam:  General Appearance: Appears older than stated age, disheveled, dressed in hospital gown, below average hygiene   Behavior: Calm and cooperative   Psychomotor Activity: No agitation noted, mild psychomotor retardation noted   Eye Contact: Limited Speech: Decreased amount, decreased tone and volume     Mood: Moderately dysphoric and anxious Affect: Restricted sad affect, tearful and anxious   Thought Process: Linear and goal-directed Descriptions of Associations: Concrete Thought Content: Hallucinations: Denies AH, VH  Delusions: No paranoia  Suicidal Thoughts: Denies passive or active SI, intention, plan  Homicidal Thoughts: Denies HI, intention, plan    Alertness/Orientation: Alert and oriented  Insight: Limited Judgment: Limited   Memory: Intact, slightly  limited   Executive Functions  Concentration: Fair Attention Span: fair Recall: [employer] of Knowledge: Fair  Assets  Assets: Communication Skills    Physical Exam: Physical Exam Vitals and nursing note reviewed.  Skin:    Comments: Left finger swollen, red, probable infection noted    Review of Systems  Gastrointestinal:  Positive for nausea.  Neurological:  Positive for headaches.  Psychiatric/Behavioral:  Positive for depression. The patient is nervous/anxious.   All other systems reviewed and are negative.  Blood pressure (!) 131/92, pulse 96, temperature 98.1 F (36.7 C), temperature source Oral, resp. rate 18, height 5\' 10"  (1.778 m), weight 81.2 kg, SpO2 99%. Body mass index is 25.68 kg/m.   Treatment Plan Summary: Daily contact with patient to assess and evaluate symptoms and progress in treatment and Medication management  ASSESSMENT:  Diagnoses / Active Problems: Principal Problem: MDD (major depressive disorder), recurrent severe, without psychosis (HCC) Diagnosis: Principal Problem:   MDD (major depressive disorder), recurrent severe, without psychosis (HCC) Active Problems:   Moderate cocaine use disorder (HCC)   Alcohol dependence (HCC)   PLAN: Safety and Monitoring:  -- Voluntary admission to inpatient psychiatric unit for safety, stabilization and treatment  -- Daily contact with patient to assess and evaluate symptoms and progress in treatment  -- Patient's case to be discussed in multi-disciplinary team meeting  -- Observation Level : q15 minute checks  -- Vital signs:  q12 hours  -- Precautions: suicide, elopement, and assault  Medications:              Continue Prozac 20 mg daily for depression and long-term treatment of anxiety             Titrate gabapentin from 100-200 mg 3 times daily for alcohol dependence and craving as well as chronic pain, also will help with mood and anxiety             Continue trazodone 50 mg at bedtime as  needed for sleep             Continue Vistaril 25 mg 3 times daily as needed for anxiety            Restart Ativan taper for alcohol withdrawals and continue to monitor CIWA score             CIWA protocol for alcohol withdrawals and follow-up            Continue pantoprazole 40 mg daily, home medication for GERD           Continue Norvasc 10 mg daily for hypertension, home medication  Start Keflex 500 mg twice daily for 7 days for finger infection, will monitor             Patient reports being on Percocet prior to admission for chronic pain but per PDMP patient was prescribed oxycodone back in March for only few days, given substance use disorder and alcohol dependence would not start opiates at this time, he is on Tylenol as needed for pain in addition to gabapentin for chronic pain long-term treatment. -- The risks/benefits/side-effects/alternatives to this medication were discussed in detail with the patient and time was given for questions. The patient consents to medication trial.                            3. Labs Reviewed: CBC within normal level, CMP within normal level  except for low potassium 3.0 received potassium in the emergency room, alcohol level less than 10, UDS positive for cocaine and marijuana CMP 10/4 potassium 3.7 normal      Lab ordered: None   4. Tobacco Use Disorder             -- Patient quit smoking 2 weeks ago, declines NicoDerm patch at this time, will follow             -- Smoking cessation encouraged   5. Group and Therapy: -- Encouraged patient to participate in unit milieu and in scheduled group therapies              --Substance Use counseling: Patient was counseled regarding need to abstain completely from alcohol marijuana and cocaine after discharge.   Patient is interested to go to inpatient residential rehab for substance use rehab after discharge if that is not successful he is in agreement with intensive outpatient rehab treatment, will follow.    6. Discharge Planning:              -- Social work and case management to assist with discharge planning and identification of hospital follow-up needs prior to discharge             -- Estimated LOS: 5-7 days             -- Discharge Concerns: Need to establish a safety plan; Medication compliance and effectiveness             -- Discharge Goals: Return home with outpatient referrals for mental health follow-up including medication management/psychotherapy     The patient is agreeable with the medication plan, as above. We will monitor the patient's response to pharmacologic treatment, and adjust medications as necessary. Patient is encouraged to participate in group therapy while admitted to the psychiatric unit. We will address other chronic and acute stressors, which contributed to the patient's worsening anxiety and depression, SI, in order to reduce the risk of self-harm at discharge.     Physician Treatment Plan for Primary Diagnosis: MDD (major depressive disorder), recurrent severe, without psychosis (HCC) Long Term Goal(s): Improvement in symptoms so as ready for discharge   Short Term Goals: Ability to identify changes in lifestyle to reduce recurrence of condition will improve, Ability to verbalize feelings will improve, Ability to disclose and discuss suicidal ideas, Ability to demonstrate self-control will improve, and Ability to identify and develop effective coping behaviors will improve     I certify that inpatient services furnished can reasonably be expected to improve the patient's condition.      Total Time Spent in Direct Patient Care:  I personally spent 35 minutes on the unit in direct patient care. The direct patient care time included face-to-face time with the patient, reviewing the patient's chart, communicating with other professionals, and coordinating care. Greater than 50% of this time was spent in counseling or coordinating care with the patient regarding  goals of hospitalization, psycho-education, and discharge planning needs.   Zi Newbury Abbott Pao, MD 11/13/2022, 11:38 AM

## 2022-11-13 NOTE — Plan of Care (Signed)
  Problem: Education: Goal: Knowledge of [facility] Education information/materials will improve Outcome: Progressing Goal: Emotional status will improve Outcome: Progressing Goal: Mental status will improve Outcome: Progressing Goal: Verbalization of understanding the information provided will improve Outcome: Progressing   Problem: Activity: Goal: Interest or engagement in activities will improve Outcome: Progressing Goal: Sleeping patterns will improve Outcome: Progressing   Problem: Coping: Goal: Ability to verbalize frustrations and anger appropriately will improve Outcome: Progressing Goal: Ability to demonstrate self-control will improve Outcome: Progressing   Problem: Health Behavior/Discharge Planning: Goal: Identification of resources available to assist in meeting health care needs will improve Outcome: Progressing Goal: Compliance with treatment plan for underlying cause of condition will improve Outcome: Progressing   Problem: Physical Regulation: Goal: Ability to maintain clinical measurements within normal limits will improve Outcome: Progressing   Problem: Safety: Goal: Periods of time without injury will increase Outcome: Progressing   Problem: Education: Goal: Utilization of techniques to improve thought processes will improve Outcome: Progressing Goal: Knowledge of the prescribed therapeutic regimen will improve Outcome: Progressing   Problem: Activity: Goal: Interest or engagement in leisure activities will improve Outcome: Progressing Goal: Imbalance in normal sleep/wake cycle will improve Outcome: Progressing   Problem: Coping: Goal: Coping ability will improve Outcome: Progressing Goal: Will verbalize feelings Outcome: Progressing   Problem: Health Behavior/Discharge Planning: Goal: Ability to make decisions will improve Outcome: Progressing Goal: Compliance with therapeutic regimen will improve Outcome: Progressing    Problem: Role Relationship: Goal: Will demonstrate positive changes in social behaviors and relationships Outcome: Progressing   Problem: Safety: Goal: Ability to disclose and discuss suicidal ideas will improve Outcome: Progressing Goal: Ability to identify and utilize support systems that promote safety will improve Outcome: Progressing   Problem: Self-Concept: Goal: Will verbalize positive feelings about self Outcome: Progressing Goal: Level of anxiety will decrease Outcome: Progressing   Problem: Education: Goal: Knowledge of disease or condition will improve Outcome: Progressing Goal: Understanding of discharge needs will improve Outcome: Progressing   Problem: Health Behavior/Discharge Planning: Goal: Ability to identify changes in lifestyle to reduce recurrence of condition will improve Outcome: Progressing Goal: Identification of resources available to assist in meeting health care needs will improve Outcome: Progressing   Problem: Physical Regulation: Goal: Complications related to the disease process, condition or treatment will be avoided or minimized Outcome: Progressing   Problem: Safety: Goal: Ability to remain free from injury will improve Outcome: Progressing   Problem: Education: Goal: Ability to make informed decisions regarding treatment will improve Outcome: Progressing   Problem: Coping: Goal: Coping ability will improve Outcome: Progressing   Problem: Health Behavior/Discharge Planning: Goal: Identification of resources available to assist in meeting health care needs will improve Outcome: Progressing   Problem: Medication: Goal: Compliance with prescribed medication regimen will improve Outcome: Progressing   Problem: Self-Concept: Goal: Ability to disclose and discuss suicidal ideas will improve Outcome: Progressing Goal: Will verbalize positive feelings about self Outcome: Progressing Note: Patient is on track. Patient will maintain  adherence

## 2022-11-13 NOTE — Plan of Care (Signed)
  Problem: Education: Goal: Verbalization of understanding the information provided will improve Outcome: Progressing   Problem: Activity: Goal: Interest or engagement in activities will improve Outcome: Progressing   

## 2022-11-13 NOTE — Progress Notes (Signed)
Patient presented  with a worried and anxious affect but was calm and  cooperative. Patient reported an anxiety which he rated by 9/10 no depression.  Pt also mentioned having Suicidal Ideations, he did not mention a plan. SI/HI or AVH. Pt reported an intense headache which he rated at a 9/10 . Pt also reported hearing deceased mothers voice telling him " It was time to come home". Patients Ciwa scores were 11 and he got a 1 mg of Ativan for that Ciwa score.   Patients PM  scheduled medications administered per MD orders. Support and encouragement offered. Q15 mins checks carried out. Patient socializing with peers in front of her room.   Patient is able to vocalize needs, Care plan being implemented as ordered. Will continue to monitor patient .

## 2022-11-13 NOTE — Group Note (Signed)
Date:  11/13/2022 Time:  9:03 PM  Group Topic/Focus:  Wrap-Up Group:   The focus of this group is to help patients review their daily goal of treatment and discuss progress on daily workbooks.    Participation Level:  Did Not Attend  Participation Quality:   N/A  Affect:   N/A  Cognitive:   N/A  Insight: None  Engagement in Group:  None  Modes of Intervention:   N/A  Additional Comments:  Patient did not attend group.   [name] 11/13/2022, 9:03 PM

## 2022-11-14 DIAGNOSIS — F332 Major depressive disorder, recurrent severe without psychotic features: Secondary | ICD-10-CM | POA: Diagnosis not present

## 2022-11-14 MED ORDER — ONDANSETRON 4 MG PO TBDP
4.0000 mg | ORAL_TABLET | Freq: Four times a day (QID) | ORAL | Status: AC | PRN
  Administered 2022-11-15 (×2): 4 mg via ORAL
  Filled 2022-11-14 (×2): qty 1

## 2022-11-14 MED ORDER — HYDROXYZINE HCL 25 MG PO TABS
25.0000 mg | ORAL_TABLET | Freq: Three times a day (TID) | ORAL | Status: DC | PRN
  Administered 2022-11-14 – 2022-11-17 (×6): 25 mg via ORAL
  Filled 2022-11-14 (×6): qty 1

## 2022-11-14 MED ORDER — IBUPROFEN 800 MG PO TABS
800.0000 mg | ORAL_TABLET | Freq: Three times a day (TID) | ORAL | Status: DC | PRN
  Administered 2022-11-14 – 2022-11-22 (×20): 800 mg via ORAL
  Filled 2022-11-14 (×20): qty 1

## 2022-11-14 MED ORDER — LACTULOSE 10 GM/15ML PO SOLN
20.0000 g | Freq: Once | ORAL | Status: AC
  Administered 2022-11-14: 20 g via ORAL
  Filled 2022-11-14: qty 30

## 2022-11-14 NOTE — Plan of Care (Signed)
  Problem: Education: Goal: Knowledge of [place] [facility] Education information/materials will improve Outcome: Progressing Goal: Emotional status will improve Outcome: Progressing Goal: Mental status will improve Outcome: Progressing Goal: Verbalization of understanding the information provided will improve Outcome: Progressing   

## 2022-11-14 NOTE — Progress Notes (Signed)
   11/14/22 2159  Psych Admission Type (Psych Patients Only)  Admission Status Voluntary  Psychosocial Assessment  Patient Complaints Anxiety  Eye Contact Fair  Facial Expression Anxious  Affect Anxious  Speech Logical/coherent  Interaction Assertive  Motor Activity Slow  Appearance/Hygiene In scrubs  Behavior Characteristics Cooperative  Mood Anxious  Thought Process  Coherency WDL  Content WDL  Delusions None reported or observed  Perception WDL  Hallucination None reported or observed  Judgment WDL  Confusion None  Danger to Self  Current suicidal ideation? Denies  Self-Injurious Behavior No self-injurious ideation or behavior indicators observed or expressed   Agreement Not to Harm Self Yes  Description of Agreement verbal  Danger to Others  Danger to Others None reported or observed

## 2022-11-14 NOTE — Group Note (Unsigned)
Date:  11/14/2022 Time:  2:06 PM  Group Topic/Focus:  Goals Group:   The focus of this group is to help patients establish daily goals to achieve during treatment and discuss how the patient can incorporate goal setting into their daily lives to aide in recovery.     Participation Level:  {BHH PARTICIPATION WGNFA:21308}  Participation Quality:  {BHH PARTICIPATION QUALITY:22265}  Affect:  {BHH AFFECT:22266}  Cognitive:  {BHH COGNITIVE:22267}  Insight: {BHH Insight2:20797}  Engagement in Group:  {BHH ENGAGEMENT IN MVHQI:69629}  Modes of Intervention:  {BHH MODES OF INTERVENTION:22269}  Additional Comments:  ***  Beckie Busing 11/14/2022, 2:06 PM

## 2022-11-14 NOTE — Plan of Care (Signed)
  Problem: Education: Goal: Knowledge of [facility] Education information/materials will improve Outcome: Progressing Goal: Emotional status will improve Outcome: Progressing Goal: Mental status will improve Outcome: Progressing Goal: Verbalization of understanding the information provided will improve Outcome: Progressing   Problem: Activity: Goal: Interest or engagement in activities will improve Outcome: Progressing Goal: Sleeping patterns will improve Outcome: Progressing   Problem: Coping: Goal: Ability to verbalize frustrations and anger appropriately will improve Outcome: Progressing Goal: Ability to demonstrate self-control will improve Outcome: Progressing   Problem: Health Behavior/Discharge Planning: Goal: Identification of resources available to assist in meeting health care needs will improve Outcome: Progressing Goal: Compliance with treatment plan for underlying cause of condition will improve Outcome: Progressing   Problem: Physical Regulation: Goal: Ability to maintain clinical measurements within normal limits will improve Outcome: Progressing   Problem: Safety: Goal: Periods of time without injury will increase Outcome: Progressing   Problem: Education: Goal: Utilization of techniques to improve thought processes will improve Outcome: Progressing Goal: Knowledge of the prescribed therapeutic regimen will improve Outcome: Progressing   Problem: Activity: Goal: Interest or engagement in leisure activities will improve Outcome: Progressing Goal: Imbalance in normal sleep/wake cycle will improve Outcome: Progressing   Problem: Coping: Goal: Coping ability will improve Outcome: Progressing Goal: Will verbalize feelings Outcome: Progressing   Problem: Health Behavior/Discharge Planning: Goal: Ability to make decisions will improve Outcome: Progressing Goal: Compliance with therapeutic regimen will improve Outcome: Progressing    Problem: Role Relationship: Goal: Will demonstrate positive changes in social behaviors and relationships Outcome: Progressing   Problem: Safety: Goal: Ability to disclose and discuss suicidal ideas will improve Outcome: Progressing Goal: Ability to identify and utilize support systems that promote safety will improve Outcome: Progressing   Problem: Self-Concept: Goal: Will verbalize positive feelings about self Outcome: Progressing Goal: Level of anxiety will decrease Outcome: Progressing   Problem: Education: Goal: Knowledge of disease or condition will improve Outcome: Progressing Goal: Understanding of discharge needs will improve Outcome: Progressing   Problem: Health Behavior/Discharge Planning: Goal: Ability to identify changes in lifestyle to reduce recurrence of condition will improve Outcome: Progressing Goal: Identification of resources available to assist in meeting health care needs will improve Outcome: Progressing   Problem: Physical Regulation: Goal: Complications related to the disease process, condition or treatment will be avoided or minimized Outcome: Progressing   Problem: Safety: Goal: Ability to remain free from injury will improve Outcome: Progressing   Problem: Education: Goal: Ability to make informed decisions regarding treatment will improve Outcome: Progressing   Problem: Coping: Goal: Coping ability will improve Outcome: Progressing   Problem: Health Behavior/Discharge Planning: Goal: Identification of resources available to assist in meeting health care needs will improve Outcome: Progressing   Problem: Medication: Goal: Compliance with prescribed medication regimen will improve Outcome: Progressing   Problem: Self-Concept: Goal: Ability to disclose and discuss suicidal ideas will improve Outcome: Progressing Goal: Will verbalize positive feelings about self Outcome: Progressing Note:

## 2022-11-14 NOTE — Progress Notes (Signed)
[facility] MD Progress Note  11/14/2022 9:43 AM [name]  MRN:  [id number]   Reason for Admission:  [name] is a 57 y.o., male with a past psychiatric history significant for alcohol use disorder severe dependence, cocaine use disorder who presents to the [facility] from [facility] emergency department for evaluation and management of worsening depression, SI, status post suicide attempt by overdose on 10 tablets of blood pressure medication. The patient is currently on Hospital Day 3.   Chart Review from last 24 hours:  The patient's chart was reviewed and nursing notes were reviewed. The patient's case was discussed in multidisciplinary team meeting. Per [facility] patient is compliant with scheduled medications on the unit including Ativan taper for alcohol withdrawals, as needed Tylenol being used for headache, as needed trazodone given at night since admission.  CIWA score 8 on 10/5 at night and 3 on 10/6 in the morning  Information Obtained Today During Patient Interview: The patient was seen and evaluated on the unit.  Upon evaluation today patient reports continued to feel depressed and anxious "I am scared I am a nervous wreck" he presents tearful and crying reporting continued not to feel safe outside the hospital continues to report passive SI wishing self dead on and off but denies active SI intention or plan while in the hospital, able to contract for safety in the hospital.  With further questions he does admit to feeling slightly better since admission scaling his depression and anxiety 7 out of 1010 being the worst compared to 10 out of 10 at time of admission.  He displays much less withdrawals compared to time of admission and denies any craving to alcohol he continues to report a depressed mood and anxiety, he was advised to use Vistaril as needed for anxiety more often and he agrees.  He also reports that his left finger swelling and redness is improving since he started  antibiotic yesterday.  He denies HI or VH but admits to related hallucinations hearing the voice of his mother since yesterday telling him "to come home" he continues to report that his main support is his adult daughter but notes that she is out of town for 2 days and thus why she is not returning phone calls  I attempted to contact patient's daughter [name] at [phone] for collateral information and to address guns access and discharge planning but no response.   Sleep  Improved, fair sleep reported by patient, 7.5 hours reported by staff   Principal Problem: MDD (major depressive disorder), recurrent severe, without psychosis (HCC) Diagnosis: Principal Problem:   MDD (major depressive disorder), recurrent severe, without psychosis (HCC) Active Problems:   Moderate cocaine use disorder (HCC)   Alcohol dependence (HCC)    Past Psychiatric History:  Prior Psychiatric diagnoses: none Past Psychiatric Hospitalizations: once 20 yrs ago after cut his wrist in suicide attempt   History of self mutilation: denies Past suicide attempts: x 2, 20 yrs ago cut his wrist, 1 and 1/2 yrs ago by OD on fentanyl Past history of HI, violent or aggressive behavior: denies   Past Psychiatric medications trials: none History of ECT/TMS: denies   Outpatient psychiatric Follow up: denies Prior Outpatient Therapy: denies  Past Medical History:  Past Medical History:  Diagnosis Date   Acid reflux    Emphysema of lung (HCC)    Head injury    Hepatitis C    S/p treatment with Epclusa in 2020   Hiatal hernia  HTN (hypertension)    Pulmonary nodules    Stroke [facility])     Past Surgical History:  Procedure Laterality Date   ACROMIO-CLAVICULAR JOINT REPAIR Right 06/28/2018   Procedure: ACROMIO-CLAVICULAR JOINT IRRIGATION AND DEBRIDEMENT;  Surgeon: Cammy Copa, MD;  Location: [facility] OR;  Service: Orthopedics;  Laterality: Right;   FACIAL FRACTURE SURGERY     IRRIGATION AND DEBRIDEMENT  SHOULDER Right 06/28/2018   Procedure: IRRIGATION AND DEBRIDEMENT SHOULDER;  Surgeon: Cammy Copa, MD;  Location: [facility] OR;  Service: Orthopedics;  Laterality: Right;   KNEE ARTHROSCOPY Left 06/23/2018   Procedure: LEFT KNEE ARTHROSCOPY KNEE I&D.;  Surgeon: Cammy Copa, MD;  Location: [facility] OR;  Service: Orthopedics;  Laterality: Left;   Family History:  Family History  Problem Relation Age of Onset   Diabetes Mother    Hypertension Mother    Stroke Father    Hypertension Father    Colon cancer Neg Hx    Family Psychiatric  History:  Psychiatric illness: denies Suicide: has a notable family history of suicide, with both his maternal grandfather and paternal uncle having taken their own lives.  Substance Abuse: multiple male family members with alcohol dependence Social History:  Living situation: Was living with his girlfriend in [place] but will not be going back there "my daughter is trying to find me a place" Social support: His oldest daughter [name] is supportive, she lives in [place] Marital Status: Was married once for about 20 years and got divorced 22 years ago Children: 6 children 2 sons and 4 daughters ages 54 years old to 41 years old, he only has relationship with his oldest daughter [name] who is supportive Education: 10th grade Employment: Currently on disability for the past year for medical reasons Military service: Denies Legal history: Has been to jail and the present several times last time to jail 3 years ago last time to prison 20 years ago for "running guns" has court on October 9 for DUI Trauma: Denies Access to guns: Has guns at girlfriend's house for hunting but he does not plan to go back there  Current Medications: Current Facility-Administered Medications  Medication Dose Route Frequency Provider Last Rate Last Admin   acetaminophen (TYLENOL) tablet 650 mg  650 mg Oral Q6H PRN Onuoha, Chinwendu V, NP   650 mg at 11/14/22 0758   alum & mag  hydroxide-simeth (MAALOX/MYLANTA) 200-200-20 MG/5ML suspension 15 mL  15 mL Oral Q4H PRN Onuoha, Chinwendu V, NP   15 mL at 11/11/22 1154   amLODipine (NORVASC) tablet 10 mg  10 mg Oral Daily Brunilda Eble, MD   10 mg at 11/14/22 0758   cephALEXin (KEFLEX) capsule 500 mg  500 mg Oral Q12H Brenyn Petrey, MD   500 mg at 11/14/22 0759   FLUoxetine (PROZAC) capsule 20 mg  20 mg Oral Daily Beckett Hickmon, MD   20 mg at 11/14/22 0758   gabapentin (NEURONTIN) capsule 200 mg  200 mg Oral TID Sarita Bottom, MD   200 mg at 11/14/22 0758   hydrOXYzine (ATARAX) tablet 25 mg  25 mg Oral Q6H PRN Onuoha, Chinwendu V, NP   25 mg at 11/14/22 1610   loperamide (IMODIUM) capsule 2-4 mg  2-4 mg Oral PRN Onuoha, Chinwendu V, NP       LORazepam (ATIVAN) tablet 2 mg  2 mg Oral TID PRN Onuoha, Chinwendu V, NP       Or   LORazepam (ATIVAN) injection 2 mg  2 mg Intramuscular TID PRN Onuoha,  Chinwendu V, NP       LORazepam (ATIVAN) tablet 1 mg  1 mg Oral BID Abbott Pao, Shelsy Seng, MD   1 mg at 11/14/22 0801   Followed by   Melene Muller ON 11/15/2022] LORazepam (ATIVAN) tablet 0.5 mg  0.5 mg Oral BID Abbott Pao, Palmira Stickle, MD       LORazepam (ATIVAN) tablet 1 mg  1 mg Oral Q6H PRN Onuoha, Chinwendu V, NP   1 mg at 11/13/22 2229   magnesium hydroxide (MILK OF MAGNESIA) suspension 15 mL  15 mL Oral Daily PRN Onuoha, Chinwendu V, NP   15 mL at 11/13/22 1636   multivitamin with minerals tablet 1 tablet  1 tablet Oral Daily Onuoha, Chinwendu V, NP   1 tablet at 11/14/22 0758   ondansetron (ZOFRAN-ODT) disintegrating tablet 4 mg  4 mg Oral Q6H PRN Onuoha, Chinwendu V, NP   4 mg at 11/13/22 0805   pantoprazole (PROTONIX) EC tablet 40 mg  40 mg Oral Daily Steen Bisig, MD   40 mg at 11/14/22 0801   thiamine (Vitamin B-1) tablet 100 mg  100 mg Oral Daily Onuoha, Chinwendu V, NP   100 mg at 11/14/22 0758   traZODone (DESYREL) tablet 50 mg  50 mg Oral QHS PRN Sarita Bottom, MD   50 mg at 11/13/22 2122    Lab Results:  No results found for this or any  previous visit (from the past 48 hour(s)).   Blood Alcohol level:  Lab Results  Component Value Date   ETH <10 11/10/2022   ETH <10 12/30/2021    Metabolic Disorder Labs: Lab Results  Component Value Date   HGBA1C 5.5 10/22/2021   MPG 111.15 10/22/2021   MPG 117 (H) 12/17/2013   No results found for: "PROLACTIN" Lab Results  Component Value Date   CHOL 161 10/22/2021   TRIG 238 (H) 10/22/2021   HDL 16 (L) 10/22/2021   CHOLHDL 10.1 10/22/2021   VLDL 48 (H) 10/22/2021   LDLCALC 97 10/22/2021   LDLCALC 109 (H) 12/17/2013    Physical Findings: AIMS:  , ,  ,  ,    CIWA:  CIWA-Ar Total: 3 COWS:     Musculoskeletal: Strength & Muscle Tone: within normal limits Gait & Station: normal Patient leans: N/A  Psychiatric Specialty Exam:  General Appearance: Appears older than stated age, disheveled, dressed in hospital gown, below average hygiene   Behavior: Calm and cooperative   Psychomotor Activity: No agitation noted, mild psychomotor retardation noted   Eye Contact: Limited Speech: Decreased amount, decreased tone and volume     Mood: Moderately dysphoric and anxious Affect: Restricted sad affect, tearful, crying and anxious   Thought Process: Linear and goal-directed Descriptions of Associations: Concrete Thought Content: Hallucinations: Denies AH, VH  Delusions: No paranoia  Suicidal Thoughts: Denies passive or active SI, intention, plan  Homicidal Thoughts: Denies HI, intention, plan    Alertness/Orientation: Alert and oriented   Insight: Limited Judgment: Limited   Memory: Intact, slightly limited   Executive Functions  Concentration: Fair Attention Span: fair Recall: [employer] of Knowledge: Fair  Assets  Assets: Communication Skills    Physical Exam: Physical Exam Vitals and nursing note reviewed.  Skin:    Comments: Left finger swollen, red, probable infection noted redness and swelling improved on antibiotics    Review of Systems   Neurological:  Positive for headaches.  Psychiatric/Behavioral:  Positive for depression and suicidal ideas. The patient is nervous/anxious.   All other systems reviewed and are negative.  Blood pressure (!) 131/93, pulse 95, temperature (!) 97.4 F (36.3 C), temperature source Oral, resp. rate 18, height 5\' 10"  (1.778 m), weight 81.2 kg, SpO2 99%. Body mass index is 25.68 kg/m.   Treatment Plan Summary: Daily contact with patient to assess and evaluate symptoms and progress in treatment and Medication management  ASSESSMENT:  Diagnoses / Active Problems: Principal Problem: MDD (major depressive disorder), recurrent severe, without psychosis (HCC) Diagnosis: Principal Problem:   MDD (major depressive disorder), recurrent severe, without psychosis (HCC) Active Problems:   Moderate cocaine use disorder (HCC)   Alcohol dependence (HCC)   PLAN: Safety and Monitoring:  -- Voluntary admission to inpatient psychiatric unit for safety, stabilization and treatment  -- Daily contact with patient to assess and evaluate symptoms and progress in treatment  -- Patient's case to be discussed in multi-disciplinary team meeting  -- Observation Level : q15 minute checks  -- Vital signs:  q12 hours  -- Precautions: suicide, elopement, and assault  Medications:              Continue Prozac 20 mg daily for depression and long-term treatment of anxiety             Continue gabapentin 200 mg 3 times daily for alcohol dependence and craving as well as chronic pain, also will help with mood and anxiety             Continue trazodone 50 mg at bedtime as needed for sleep             Continue Vistaril 25 mg 3 times daily as needed for anxiety          Continue Ativan taper for alcohol withdrawals and continue to monitor CIWA score             CIWA protocol for alcohol withdrawals and follow-up            Continue pantoprazole 40 mg daily, home medication for GERD           Continue Norvasc 10 mg daily  for hypertension, home medication  Continue Keflex 500 mg twice daily for 7 days for finger infection, will monitor             Patient reports being on Percocet prior to admission for chronic pain but per PDMP patient was prescribed oxycodone back in March for only few days, given substance use disorder and alcohol dependence would not start opiates at this time, he is on Tylenol as needed for pain in addition to gabapentin for chronic pain long-term treatment. -- The risks/benefits/side-effects/alternatives to this medication were discussed in detail with the patient and time was given for questions. The patient consents to medication trial.                            3. Labs Reviewed: CBC within normal level, CMP within normal level except for low potassium 3.0 received potassium in the emergency room, alcohol level less than 10, UDS positive for cocaine and marijuana CMP 10/4 potassium 3.7 normal      Lab ordered: None   4. Tobacco Use Disorder             -- Patient quit smoking 2 weeks ago, declines NicoDerm patch at this time, will follow             -- Smoking cessation encouraged   5. Group and Therapy: -- Encouraged patient to participate in unit milieu  and in scheduled group therapies              --Substance Use counseling: Patient was counseled regarding need to abstain completely from alcohol marijuana and cocaine after discharge.   Patient is interested to go to inpatient residential rehab for substance use rehab after discharge if that is not successful he is in agreement with intensive outpatient rehab treatment, will follow.   6. Discharge Planning:              -- Social work and case management to assist with discharge planning and identification of hospital follow-up needs prior to discharge             -- Estimated LOS: 5-7 days             -- Discharge Concerns: Need to establish a safety plan; Medication compliance and effectiveness             -- Discharge Goals:  Return home with outpatient referrals for mental health follow-up including medication management/psychotherapy     The patient is agreeable with the medication plan, as above. We will monitor the patient's response to pharmacologic treatment, and adjust medications as necessary. Patient is encouraged to participate in group therapy while admitted to the psychiatric unit. We will address other chronic and acute stressors, which contributed to the patient's worsening anxiety and depression, SI, in order to reduce the risk of self-harm at discharge.     Physician Treatment Plan for Primary Diagnosis: MDD (major depressive disorder), recurrent severe, without psychosis (HCC) Long Term Goal(s): Improvement in symptoms so as ready for discharge   Short Term Goals: Ability to identify changes in lifestyle to reduce recurrence of condition will improve, Ability to verbalize feelings will improve, Ability to disclose and discuss suicidal ideas, Ability to demonstrate self-control will improve, and Ability to identify and develop effective coping behaviors will improve     I certify that inpatient services furnished can reasonably be expected to improve the patient's condition.      Total Time Spent in Direct Patient Care:  I personally spent 35 minutes on the unit in direct patient care. The direct patient care time included face-to-face time with the patient, reviewing the patient's chart, communicating with other professionals, and coordinating care. Greater than 50% of this time was spent in counseling or coordinating care with the patient regarding goals of hospitalization, psycho-education, and discharge planning needs.   Tillmon Kisling Abbott Pao, MD 11/14/2022, 9:43 AM

## 2022-11-14 NOTE — [facility] Group Notes (Signed)
Type of Therapy and Topic:  Group Therapy:Gratitude  Participation Level:  Did Not Attend   Description of Group:   In this group, patients shared and discussed the importance of acknowledging the elements in their lives for which they are grateful and how this can positively impact their mood.  The group discussed how bringing the positive elements of their lives to the forefront of their minds can help with recovery from any illness, physical or mental.  An exercise was done as a group in which a list was made of gratitude items in order to encourage participants to consider other potential positives in their lives.  Therapeutic Goals: Patients will identify one or more item for which they are grateful in each of 6 categories:  people, experiences, things, places, skills, and other. Patients will discuss how it is possible to seek out gratitude in even bad situations. Patients will explore other possible items of gratitude that they could remember.   Summary of Patient Progress: NA  Therapeutic Modalities:   Solution-Focused Therapy Activity

## 2022-11-14 NOTE — Progress Notes (Signed)
I assumed care for [name] at about 08:00.  He was resting in his bedroom, alert, reports poor sleep due to headache and mild alcohol withdrawal symptoms overnight, he received prn Hydroxyzine for anxiety, Ibuprofen for headache and lactulose for constipation, see MAR for effectiveness.  Vital signs significant for elevated BP, recheck at patients baseline, denied any cp/n/v/d at the time of my assessment. CIWA scores minimal. He is being monitored as ordered.

## 2022-11-14 NOTE — Progress Notes (Signed)
   11/14/22 0530  15 Minute Checks  Location Bedroom  Visual Appearance Calm  Behavior Sleeping  Sleep (Behavioral Health Patients Only)  Calculate sleep? (Click Yes once per 24 hr at 0600 safety check) Yes  Documented sleep last 24 hours 7.5

## 2022-11-15 DIAGNOSIS — F332 Major depressive disorder, recurrent severe without psychotic features: Secondary | ICD-10-CM | POA: Diagnosis not present

## 2022-11-15 MED ORDER — TRAZODONE HCL 100 MG PO TABS
100.0000 mg | ORAL_TABLET | Freq: Every evening | ORAL | Status: DC | PRN
  Administered 2022-11-15: 100 mg via ORAL
  Filled 2022-11-15: qty 1

## 2022-11-15 MED ORDER — HYDROXYZINE HCL 50 MG PO TABS
50.0000 mg | ORAL_TABLET | Freq: Two times a day (BID) | ORAL | Status: DC
  Administered 2022-11-15 – 2022-11-18 (×6): 50 mg via ORAL
  Filled 2022-11-15 (×12): qty 1

## 2022-11-15 MED ORDER — RISPERIDONE 1 MG PO TABS
1.0000 mg | ORAL_TABLET | Freq: Every day | ORAL | Status: DC
  Administered 2022-11-15 – 2022-11-22 (×7): 1 mg via ORAL
  Filled 2022-11-15 (×9): qty 1

## 2022-11-15 NOTE — [facility] Group Notes (Signed)
[facility] Group Notes:  (Nursing/MHT/Case Management/Adjunct)  Date:  11/15/2022  Time:  9:54 PM  Type of Therapy:   Wrap-up group  Participation Level:  Did Not Attend  Participation Quality:    Affect:    Cognitive:    Insight:    Engagement in Group:    Modes of Intervention:    Summary of Progress/Problems: Pt refused to attend group.   [name] 11/15/2022, 9:54 PM

## 2022-11-15 NOTE — Progress Notes (Signed)
[facility] MD Progress Note  11/15/2022 10:12 AM [name]  MRN:  [id number]   Reason for Admission:  [name] is a 57 y.o., male with a past psychiatric history significant for alcohol use disorder severe dependence, cocaine use disorder who presents to the [facility] from [facility] emergency department for evaluation and management of worsening depression, SI, status post suicide attempt by overdose on 10 tablets of blood pressure medication. The patient is currently on Hospital Day 4.   Chart Review from last 24 hours:  The patient's chart was reviewed and nursing notes were reviewed. The patient's case was discussed in multidisciplinary team meeting. Per MAR CIWA score improving 5 last night and 0 this morning, as needed Vistaril was used 10/6 with some improvement of anxiety noted, as needed Advil was used 10/6 for headache, as needed trazodone being used every night.  Information Obtained Today During Patient Interview: The patient was seen and evaluated on the unit.  Patient does appear slightly better today seems Colmer and less depressed, he does agree that he is feeling better regarding anxiety and mood scale his depression 6 out of 1010 being the worst and anxiety 10 out of 9 10 being the worst, he continues to appear tearful but seems to be having less crying spells than yesterday, he continues to report auditory hallucinations hearing voices of his mother telling him "to come home" he tells these voices scares him and last time heard it was this morning, discussed starting antipsychotic, will follow.  He reports anxiety improved some with Vistaril yesterday but ongoing, discussed restarting Vistaril scheduled in addition to as needed.  He denies active SI intention or plan but reports passive SI on and off wishing self dead, denies HI or VH.  He reports interrupted sleep last night and we discussed titrating trazodone.  He does report and appears to be in much less  withdrawals since yesterday, he denies craving to alcohol or cocaine and continues to be interested in inpatient residential rehab.  I attempted to contact patient's daughter [name] at [phone] on 10/6 for collateral information and to address guns access and discharge planning but no response.   Sleep  Interrupted sleep reported   Principal Problem: MDD (major depressive disorder), recurrent severe, without psychosis (HCC) Diagnosis: Principal Problem:   MDD (major depressive disorder), recurrent severe, without psychosis (HCC) Active Problems:   Moderate cocaine use disorder (HCC)   Alcohol dependence (HCC)    Past Psychiatric History:  Prior Psychiatric diagnoses: none Past Psychiatric Hospitalizations: once 20 yrs ago after cut his wrist in suicide attempt   History of self mutilation: denies Past suicide attempts: x 2, 20 yrs ago cut his wrist, 1 and 1/2 yrs ago by OD on fentanyl Past history of HI, violent or aggressive behavior: denies   Past Psychiatric medications trials: none History of ECT/TMS: denies   Outpatient psychiatric Follow up: denies Prior Outpatient Therapy: denies  Past Medical History:  Past Medical History:  Diagnosis Date   Acid reflux    Emphysema of lung (HCC)    Head injury    Hepatitis C    S/p treatment with Epclusa in 2020   Hiatal hernia    HTN (hypertension)    Pulmonary nodules    Stroke [facility])     Past Surgical History:  Procedure Laterality Date   ACROMIO-CLAVICULAR JOINT REPAIR Right 06/28/2018   Procedure: ACROMIO-CLAVICULAR JOINT IRRIGATION AND DEBRIDEMENT;  Surgeon: Cammy Copa, MD;  Location: [facility] OR;  Service: Orthopedics;  Laterality: Right;   FACIAL FRACTURE SURGERY     IRRIGATION AND DEBRIDEMENT SHOULDER Right 06/28/2018   Procedure: IRRIGATION AND DEBRIDEMENT SHOULDER;  Surgeon: Cammy Copa, MD;  Location: [facility] OR;  Service: Orthopedics;  Laterality: Right;   KNEE ARTHROSCOPY Left 06/23/2018    Procedure: LEFT KNEE ARTHROSCOPY KNEE I&D.;  Surgeon: Cammy Copa, MD;  Location: [facility] OR;  Service: Orthopedics;  Laterality: Left;   Family History:  Family History  Problem Relation Age of Onset   Diabetes Mother    Hypertension Mother    Stroke Father    Hypertension Father    Colon cancer Neg Hx    Family Psychiatric  History:  Psychiatric illness: denies Suicide: has a notable family history of suicide, with both his maternal grandfather and paternal uncle having taken their own lives.  Substance Abuse: multiple male family members with alcohol dependence Social History:  Living situation: Was living with his girlfriend in [place] but will not be going back there "my daughter is trying to find me a place" Social support: His oldest daughter [name] is supportive, she lives in [place] Marital Status: Was married once for about 20 years and got divorced 22 years ago Children: 6 children 2 sons and 4 daughters ages 42 years old to 16 years old, he only has relationship with his oldest daughter [name] who is supportive Education: 10th grade Employment: Currently on disability for the past year for medical reasons Military service: Denies Legal history: Has been to jail and the present several times last time to jail 3 years ago last time to prison 20 years ago for "running guns" has court on October 9 for DUI Trauma: Denies Access to guns: Has guns at girlfriend's house for hunting but he does not plan to go back there  Current Medications: Current Facility-Administered Medications  Medication Dose Route Frequency Provider Last Rate Last Admin   acetaminophen (TYLENOL) tablet 650 mg  650 mg Oral Q6H PRN Onuoha, Chinwendu V, NP   650 mg at 11/14/22 0758   alum & mag hydroxide-simeth (MAALOX/MYLANTA) 200-200-20 MG/5ML suspension 15 mL  15 mL Oral Q4H PRN Onuoha, Chinwendu V, NP   15 mL at 11/11/22 1154   amLODipine (NORVASC) tablet 10 mg  10 mg Oral Daily Antonis Lor, MD    10 mg at 11/15/22 0807   cephALEXin (KEFLEX) capsule 500 mg  500 mg Oral Q12H Russ Looper, MD   500 mg at 11/15/22 0808   FLUoxetine (PROZAC) capsule 20 mg  20 mg Oral Daily Kaydie Petsch, MD   20 mg at 11/15/22 0807   gabapentin (NEURONTIN) capsule 200 mg  200 mg Oral TID Sarita Bottom, MD   200 mg at 11/15/22 0806   hydrOXYzine (ATARAX) tablet 25 mg  25 mg Oral TID PRN Sarita Bottom, MD   25 mg at 11/14/22 2159   ibuprofen (ADVIL) tablet 800 mg  800 mg Oral Q8H PRN Abbott Pao, Alexiah Koroma, MD   800 mg at 11/14/22 2137   LORazepam (ATIVAN) tablet 2 mg  2 mg Oral TID PRN Onuoha, Chinwendu V, NP       Or   LORazepam (ATIVAN) injection 2 mg  2 mg Intramuscular TID PRN Onuoha, Chinwendu V, NP       LORazepam (ATIVAN) tablet 0.5 mg  0.5 mg Oral BID Andrius Andrepont, MD   0.5 mg at 11/15/22 0808   magnesium hydroxide (MILK OF MAGNESIA) suspension 15 mL  15 mL Oral Daily PRN Onuoha, Chinwendu  V, NP   15 mL at 11/13/22 1636   multivitamin with minerals tablet 1 tablet  1 tablet Oral Daily Onuoha, Chinwendu V, NP   1 tablet at 11/15/22 0806   ondansetron (ZOFRAN-ODT) disintegrating tablet 4 mg  4 mg Oral Q6H PRN Abbott Pao, Dameshia Seybold, MD       pantoprazole (PROTONIX) EC tablet 40 mg  40 mg Oral Daily Metztli Sachdev, MD   40 mg at 11/15/22 0806   thiamine (Vitamin B-1) tablet 100 mg  100 mg Oral Daily Onuoha, Chinwendu V, NP   100 mg at 11/15/22 0806   traZODone (DESYREL) tablet 50 mg  50 mg Oral QHS PRN Sarita Bottom, MD   50 mg at 11/13/22 2122    Lab Results:  No results found for this or any previous visit (from the past 48 hour(s)).   Blood Alcohol level:  Lab Results  Component Value Date   ETH <10 11/10/2022   ETH <10 12/30/2021    Metabolic Disorder Labs: Lab Results  Component Value Date   HGBA1C 5.5 10/22/2021   MPG 111.15 10/22/2021   MPG 117 (H) 12/17/2013   No results found for: "PROLACTIN" Lab Results  Component Value Date   CHOL 161 10/22/2021   TRIG 238 (H) 10/22/2021   HDL 16 (L)  10/22/2021   CHOLHDL 10.1 10/22/2021   VLDL 48 (H) 10/22/2021   LDLCALC 97 10/22/2021   LDLCALC 109 (H) 12/17/2013    Physical Findings: AIMS: Facial and Oral Movements Muscles of Facial Expression: None, normal Lips and Perioral Area: None, normal Jaw: None, normal Tongue: None, normal,Extremity Movements Upper (arms, wrists, hands, fingers): None, normal Lower (legs, knees, ankles, toes): None, normal, Trunk Movements Neck, shoulders, hips: None, normal, Overall Severity Severity of abnormal movements (highest score from questions above): None, normal Incapacitation due to abnormal movements: None, normal Patient's awareness of abnormal movements (rate only patient's report): No Awareness, Dental Status Current problems with teeth and/or dentures?: No Does patient usually wear dentures?: No  CIWA:  CIWA-Ar Total: 0 COWS:     Musculoskeletal: Strength & Muscle Tone: within normal limits Gait & Station: normal Patient leans: N/A  Psychiatric Specialty Exam:  General Appearance: Appears older than stated age, disheveled, dressed in hospital gown, below average hygiene   Behavior: Calm and cooperative   Psychomotor Activity: No agitation noted, mild psychomotor retardation noted   Eye Contact: Improved gait limited Speech: Decreased amount, decreased tone and volume     Mood: Moderately dysphoric and anxious but slightly improved Affect: Restricted sad affect, tearful, crying and anxious, slightly improved   Thought Process: Linear and goal-directed Descriptions of Associations: Concrete Thought Content: Hallucinations: Denies VH, reports AH hearing voices of his mother telling him "to come home" describes it as a scary, does not appear responding to stimuli during evaluation Delusions: No paranoia  Suicidal Thoughts: Reports passive SI wishing self dead but denies active SI, intention, plan  Homicidal Thoughts: Denies HI, intention, plan    Alertness/Orientation:  Alert and oriented   Insight: Limited Judgment: Limited   Memory: Intact, slightly limited   Executive Functions  Concentration: Fair Attention Span: fair Recall: [employer] of Knowledge: Fair  Assets  Assets: Communication Skills    Physical Exam: Physical Exam Vitals and nursing note reviewed.  Skin:    Comments: Left finger swollen, red, probable infection noted redness and swelling improved on antibiotics    Review of Systems  Neurological:  Positive for headaches.  Psychiatric/Behavioral:  Positive for depression and  suicidal ideas. The patient is nervous/anxious.   All other systems reviewed and are negative.  Blood pressure 128/88, pulse 95, temperature (!) 97.4 F (36.3 C), temperature source Oral, resp. rate 18, height 5\' 10"  (1.778 m), weight 81.2 kg, SpO2 98%. Body mass index is 25.68 kg/m.   Treatment Plan Summary: Daily contact with patient to assess and evaluate symptoms and progress in treatment and Medication management  ASSESSMENT:  Diagnoses / Active Problems: Principal Problem: MDD (major depressive disorder), recurrent severe, without psychosis (HCC) Diagnosis: Principal Problem:   MDD (major depressive disorder), recurrent severe, without psychosis (HCC) Active Problems:   Moderate cocaine use disorder (HCC)   Alcohol dependence (HCC)   PLAN: Safety and Monitoring:  -- Voluntary admission to inpatient psychiatric unit for safety, stabilization and treatment  -- Daily contact with patient to assess and evaluate symptoms and progress in treatment  -- Patient's case to be discussed in multi-disciplinary team meeting  -- Observation Level : q15 minute checks  -- Vital signs:  q12 hours  -- Precautions: suicide, elopement, and assault  Medications:              Continue Prozac 20 mg daily for depression and long-term treatment of anxiety  Start trial of risperidone 1 mg at bedtime to help with mood and psychosis and monitor effects and  safety and titrate dosing accordingly             Continue gabapentin 200 mg 3 times daily for alcohol dependence and craving as well as chronic pain, also will help with mood and anxiety             Titrate trazodone from 50 to 100 mg mg at bedtime as needed for sleep             Start Vistaril 50 mg twice daily scheduled for anxiety at 8 AM and 4 PM.   continue Vistaril 25 mg 3 times daily as needed for anxiety          Continue Ativan taper for alcohol withdrawals and continue to monitor CIWA score             CIWA protocol for alcohol withdrawals and follow-up            Continue pantoprazole 40 mg daily, home medication for GERD           Continue Norvasc 10 mg daily for hypertension, home medication  Continue Keflex 500 mg twice daily for 7 days for finger infection, will monitor             Patient reports being on Percocet prior to admission for chronic pain but per PDMP patient was prescribed oxycodone back in March for only few days, given substance use disorder and alcohol dependence would not start opiates at this time, he is on Tylenol as needed for pain in addition to gabapentin for chronic pain long-term treatment. -- The risks/benefits/side-effects/alternatives to this medication were discussed in detail with the patient and time was given for questions. The patient consents to medication trial.                            3. Labs Reviewed: CBC within normal level, CMP within normal level except for low potassium 3.0 received potassium in the emergency room, alcohol level less than 10, UDS positive for cocaine and marijuana CMP 10/4 potassium 3.7 normal      Lab ordered: Hemoglobin A1c and fasting  lipid panel, EKG   4. Tobacco Use Disorder             -- Patient quit smoking 2 weeks ago, declines NicoDerm patch at this time, will follow             -- Smoking cessation encouraged   5. Group and Therapy: -- Encouraged patient to participate in unit milieu and in scheduled group  therapies              --Substance Use counseling: Patient was counseled regarding need to abstain completely from alcohol marijuana and cocaine after discharge.   Patient is interested to go to inpatient residential rehab for substance use rehab after discharge if that is not successful he is in agreement with intensive outpatient rehab treatment, will follow.   6. Discharge Planning:              -- Social work and case management to assist with discharge planning and identification of hospital follow-up needs prior to discharge             -- Estimated LOS: 5-7 days             -- Discharge Concerns: Need to establish a safety plan; Medication compliance and effectiveness             -- Discharge Goals: Return home with outpatient referrals for mental health follow-up including medication management/psychotherapy     The patient is agreeable with the medication plan, as above. We will monitor the patient's response to pharmacologic treatment, and adjust medications as necessary. Patient is encouraged to participate in group therapy while admitted to the psychiatric unit. We will address other chronic and acute stressors, which contributed to the patient's worsening anxiety and depression, SI, in order to reduce the risk of self-harm at discharge.     Physician Treatment Plan for Primary Diagnosis: MDD (major depressive disorder), recurrent severe, without psychosis (HCC) Long Term Goal(s): Improvement in symptoms so as ready for discharge   Short Term Goals: Ability to identify changes in lifestyle to reduce recurrence of condition will improve, Ability to verbalize feelings will improve, Ability to disclose and discuss suicidal ideas, Ability to demonstrate self-control will improve, and Ability to identify and develop effective coping behaviors will improve     I certify that inpatient services furnished can reasonably be expected to improve the patient's condition.      Total Time  Spent in Direct Patient Care:  I personally spent 35 minutes on the unit in direct patient care. The direct patient care time included face-to-face time with the patient, reviewing the patient's chart, communicating with other professionals, and coordinating care. Greater than 50% of this time was spent in counseling or coordinating care with the patient regarding goals of hospitalization, psycho-education, and discharge planning needs.   Bevan Vu Abbott Pao, MD 11/15/2022, 10:12 AM

## 2022-11-15 NOTE — Group Note (Signed)
Recreation Therapy Group Note   Group Topic:Stress Management  Group Date: 11/15/2022 Start Time: 1000 End Time: 1020 Facilitators: Dariella Gillihan-McCall, LRT,CTRS Location: [address] Dayroom   Group Topic: Stress Management   Goal Area(s) Addresses:  Patient will actively participate in stress management techniques presented during session.  Patient will successfully identify benefit of practicing stress management post d/c.   Intervention: Relaxation exercise with ambient sound and script   Group Description: Guided Imagery. LRT provided education, instruction, and demonstration on practice of visualization via guided imagery. Patient was asked to participate in the technique introduced during session. LRT debriefed including topics of mindfulness, stress management and specific scenarios each patient could use these techniques. Patients were given suggestions of ways to access scripts post d/c and encouraged to explore Youtube and other apps available on smartphones, tablets, and computers.  Education:  Stress Management, Discharge Planning.   Education Outcome: Acknowledges education   Affect/Mood: N/A   Participation Level: Did not attend    Clinical Observations/Individualized Feedback:     Plan: Continue to engage patient in RT group sessions 2-3x/week.   Adaliz Dobis-McCall, LRT,CTRS  11/15/2022 1:12 PM

## 2022-11-15 NOTE — Plan of Care (Signed)
  Problem: Education: Goal: Knowledge of [facility] Education information/materials will improve Outcome: Progressing Goal: Emotional status will improve Outcome: Progressing Goal: Mental status will improve Outcome: Progressing Goal: Verbalization of understanding the information provided will improve Outcome: Progressing   Problem: Activity: Goal: Interest or engagement in activities will improve Outcome: Progressing Goal: Sleeping patterns will improve Outcome: Progressing   Problem: Coping: Goal: Ability to verbalize frustrations and anger appropriately will improve Outcome: Progressing Goal: Ability to demonstrate self-control will improve Outcome: Progressing   Problem: Health Behavior/Discharge Planning: Goal: Identification of resources available to assist in meeting health care needs will improve Outcome: Progressing Goal: Compliance with treatment plan for underlying cause of condition will improve Outcome: Progressing   Problem: Physical Regulation: Goal: Ability to maintain clinical measurements within normal limits will improve Outcome: Progressing   Problem: Safety: Goal: Periods of time without injury will increase Outcome: Progressing   Problem: Education: Goal: Utilization of techniques to improve thought processes will improve Outcome: Progressing Goal: Knowledge of the prescribed therapeutic regimen will improve Outcome: Progressing   Problem: Activity: Goal: Interest or engagement in leisure activities will improve Outcome: Progressing Goal: Imbalance in normal sleep/wake cycle will improve Outcome: Progressing   Problem: Coping: Goal: Coping ability will improve Outcome: Progressing Goal: Will verbalize feelings Outcome: Progressing   Problem: Health Behavior/Discharge Planning: Goal: Ability to make decisions will improve Outcome: Progressing Goal: Compliance with therapeutic regimen will improve Outcome: Progressing    Problem: Role Relationship: Goal: Will demonstrate positive changes in social behaviors and relationships Outcome: Progressing   Problem: Safety: Goal: Ability to disclose and discuss suicidal ideas will improve Outcome: Progressing Goal: Ability to identify and utilize support systems that promote safety will improve Outcome: Progressing   Problem: Self-Concept: Goal: Will verbalize positive feelings about self Outcome: Progressing Goal: Level of anxiety will decrease Outcome: Progressing   Problem: Education: Goal: Knowledge of disease or condition will improve Outcome: Progressing Goal: Understanding of discharge needs will improve Outcome: Progressing   Problem: Health Behavior/Discharge Planning: Goal: Ability to identify changes in lifestyle to reduce recurrence of condition will improve Outcome: Progressing Goal: Identification of resources available to assist in meeting health care needs will improve Outcome: Progressing   Problem: Physical Regulation: Goal: Complications related to the disease process, condition or treatment will be avoided or minimized Outcome: Progressing   Problem: Safety: Goal: Ability to remain free from injury will improve Outcome: Progressing   Problem: Education: Goal: Ability to make informed decisions regarding treatment will improve Outcome: Progressing   Problem: Coping: Goal: Coping ability will improve Outcome: Progressing   Problem: Health Behavior/Discharge Planning: Goal: Identification of resources available to assist in meeting health care needs will improve Outcome: Progressing   Problem: Medication: Goal: Compliance with prescribed medication regimen will improve Outcome: Progressing   Problem: Self-Concept: Goal: Ability to disclose and discuss suicidal ideas will improve Outcome: Progressing Goal: Will verbalize positive feelings about self Outcome: Progressing Note: Patient is on track. Patient will maintain  adherence

## 2022-11-15 NOTE — Group Note (Unsigned)
Date:  11/15/2022 Time:  1:04 AM  Group Topic/Focus:  Wrap-Up Group:   The focus of this group is to help patients review their daily goal of treatment and discuss progress on daily workbooks.    Participation Level:  Did Not Attend  Participation Quality:   N/A  Affect:   N/A  Cognitive:   N/A  Insight: None  Engagement in Group:   N/A  Modes of Intervention:   N/A  Additional Comments:  Patient did not attend group.   [name] 11/15/2022, 1:04 AM

## 2022-11-15 NOTE — Group Note (Signed)
Date:  11/15/2022 Time:  10:21 AM  Group Topic/Focus:  Goals Group:   The focus of this group is to help patients establish daily goals to achieve during treatment and discuss how the patient can incorporate goal setting into their daily lives to aide in recovery.    Participation Level:  Appropriate  Participation Quality:  Appropriate  Affect:  Appropriate  Cognitive:  Appropriate  Insight: Appropriate  Engagement in Group:  Engaged  Modes of Intervention:  Discussion  Additional Comments:    [name] D Bader Stubblefield 11/15/2022, 10:21 AM

## 2022-11-16 ENCOUNTER — Encounter: Payer: Self-pay | Admitting: *Deleted

## 2022-11-16 ENCOUNTER — Encounter (HOSPITAL_COMMUNITY): Payer: Self-pay | Admitting: Psychiatry

## 2022-11-16 DIAGNOSIS — F332 Major depressive disorder, recurrent severe without psychotic features: Secondary | ICD-10-CM | POA: Diagnosis not present

## 2022-11-16 LAB — LIPID PANEL
Cholesterol: 221 mg/dL — ABNORMAL HIGH (ref 0–200)
HDL: 49 mg/dL (ref >40)
LDL Cholesterol: 111 mg/dL — ABNORMAL HIGH (ref 0–99)
Total CHOL/HDL Ratio: 4.5 {ratio}
Triglycerides: 303 mg/dL — ABNORMAL HIGH (ref <150)
VLDL: 61 mg/dL — ABNORMAL HIGH (ref 0–40)

## 2022-11-16 LAB — HEMOGLOBIN A1C
Hgb A1c MFr Bld: 5.5 % (ref 4.8–5.6)
Mean Plasma Glucose: 111.15 mg/dL

## 2022-11-16 MED ORDER — MELATONIN 5 MG PO TABS
5.0000 mg | ORAL_TABLET | Freq: Every day | ORAL | Status: DC
  Administered 2022-11-16 – 2022-11-22 (×6): 5 mg via ORAL
  Filled 2022-11-16 (×8): qty 1

## 2022-11-16 MED ORDER — NICOTINE 14 MG/24HR TD PT24
14.0000 mg | MEDICATED_PATCH | Freq: Every day | TRANSDERMAL | Status: DC
  Filled 2022-11-16 (×2): qty 1

## 2022-11-16 MED ORDER — DOCUSATE SODIUM 100 MG PO CAPS
100.0000 mg | ORAL_CAPSULE | Freq: Every day | ORAL | Status: DC
  Administered 2022-11-16 – 2022-11-22 (×7): 100 mg via ORAL
  Filled 2022-11-16 (×10): qty 1

## 2022-11-16 MED ORDER — ATORVASTATIN CALCIUM 20 MG PO TABS
20.0000 mg | ORAL_TABLET | Freq: Every day | ORAL | Status: DC
  Administered 2022-11-16 – 2022-11-22 (×7): 20 mg via ORAL
  Filled 2022-11-16 (×8): qty 1
  Filled 2022-11-16: qty 2
  Filled 2022-11-16: qty 1

## 2022-11-16 MED ORDER — MIRTAZAPINE 7.5 MG PO TABS
7.5000 mg | ORAL_TABLET | Freq: Every evening | ORAL | Status: DC | PRN
  Administered 2022-11-19 – 2022-11-22 (×3): 7.5 mg via ORAL
  Filled 2022-11-16 (×3): qty 1

## 2022-11-16 MED ORDER — SUMATRIPTAN SUCCINATE 25 MG PO TABS
25.0000 mg | ORAL_TABLET | Freq: Once | ORAL | Status: AC
  Administered 2022-11-16: 25 mg via ORAL
  Filled 2022-11-16 (×2): qty 1

## 2022-11-16 NOTE — Group Note (Signed)
LCSW Group Therapy Note   Group Date: 11/16/2022 Start Time: 1100 End Time: 1200  Type of Therapy and Topic:  Group Therapy: Are You Under Stress?!?!  Participation Level:  Did Not Attend  Description of Group: This process group involved patients examining stress and how it impacts their lives. Distress and eustress definitions were explained and patients identified both good and bad stressors in their lives. Accompanying worksheet was used to help patients identify the physical and emotional symptoms of stress and techniques/copings skills they utilize to help reduce these symptoms.    Therapeutic Goals:  1.  Patients will talk about their experiences with distress and eustress. 2. Patients will identify stressors in their lives and the physical and emotional  symptoms they experience while under stress. 3. Patients to identify actions/coping skills that they utilize to help ameliorate  symptoms of stress.  Summary of Patient Progress:   Pt did not attend group   Therapeutic Modalities:  Cognitive Behavioral Therapy Solution-Focused Therapy  Izell [place] LCSW 11/16/2022 2:42 PM

## 2022-11-16 NOTE — [facility] Counselor (Signed)
  11/16/2022  11:54 AM   [name]  Type of note: Tx center information   CSW provided information to Pt for [facility] and [facility]. CSW will follow-up with Pt to make sure he calls.   Signed:  Marya Landry MSW, LCSWA 11/16/2022  11:54 AM

## 2022-11-16 NOTE — Progress Notes (Signed)
   11/16/22 2100  Psych Admission Type (Psych Patients Only)  Admission Status Voluntary  Psychosocial Assessment  Patient Complaints Anxiety  Eye Contact Fair  Facial Expression Anxious  Affect Anxious;Appropriate to circumstance  Speech Logical/coherent  Interaction Assertive  Motor Activity  (WNL)  Appearance/Hygiene In scrubs  Behavior Characteristics Cooperative  Mood Depressed;Anxious  Thought Process  Coherency WDL  Content WDL  Delusions None reported or observed  Perception WDL  Hallucination Auditory  Judgment Impaired  Confusion None  Danger to Self  Current suicidal ideation? Passive  Agreement Not to Harm Self Yes  Description of Agreement verbal  Danger to Others  Danger to Others None reported or observed

## 2022-11-16 NOTE — Progress Notes (Signed)

## 2022-11-16 NOTE — Plan of Care (Signed)
  Problem: Education: Goal: Knowledge of [facility] Education information/materials will improve Outcome: Progressing Goal: Emotional status will improve Outcome: Progressing Goal: Mental status will improve Outcome: Progressing Goal: Verbalization of understanding the information provided will improve Outcome: Progressing   Problem: Activity: Goal: Interest or engagement in activities will improve Outcome: Progressing Goal: Sleeping patterns will improve Outcome: Progressing   Problem: Coping: Goal: Ability to verbalize frustrations and anger appropriately will improve Outcome: Progressing Goal: Ability to demonstrate self-control will improve Outcome: Progressing   Problem: Health Behavior/Discharge Planning: Goal: Identification of resources available to assist in meeting health care needs will improve Outcome: Progressing Goal: Compliance with treatment plan for underlying cause of condition will improve Outcome: Progressing   Problem: Physical Regulation: Goal: Ability to maintain clinical measurements within normal limits will improve Outcome: Progressing   Problem: Safety: Goal: Periods of time without injury will increase Outcome: Progressing   Problem: Education: Goal: Utilization of techniques to improve thought processes will improve Outcome: Progressing Goal: Knowledge of the prescribed therapeutic regimen will improve Outcome: Progressing   Problem: Activity: Goal: Interest or engagement in leisure activities will improve Outcome: Progressing Goal: Imbalance in normal sleep/wake cycle will improve Outcome: Progressing   Problem: Coping: Goal: Coping ability will improve Outcome: Progressing Goal: Will verbalize feelings Outcome: Progressing   Problem: Health Behavior/Discharge Planning: Goal: Ability to make decisions will improve Outcome: Progressing Goal: Compliance with therapeutic regimen will improve Outcome: Progressing    Problem: Role Relationship: Goal: Will demonstrate positive changes in social behaviors and relationships Outcome: Progressing   Problem: Safety: Goal: Ability to disclose and discuss suicidal ideas will improve Outcome: Progressing Goal: Ability to identify and utilize support systems that promote safety will improve Outcome: Progressing   Problem: Self-Concept: Goal: Will verbalize positive feelings about self Outcome: Progressing Goal: Level of anxiety will decrease Outcome: Progressing   Problem: Education: Goal: Knowledge of disease or condition will improve Outcome: Progressing Goal: Understanding of discharge needs will improve Outcome: Progressing   Problem: Health Behavior/Discharge Planning: Goal: Ability to identify changes in lifestyle to reduce recurrence of condition will improve Outcome: Progressing Goal: Identification of resources available to assist in meeting health care needs will improve Outcome: Progressing   Problem: Physical Regulation: Goal: Complications related to the disease process, condition or treatment will be avoided or minimized Outcome: Progressing   Problem: Safety: Goal: Ability to remain free from injury will improve Outcome: Progressing   Problem: Education: Goal: Ability to make informed decisions regarding treatment will improve Outcome: Progressing   Problem: Coping: Goal: Coping ability will improve Outcome: Progressing   Problem: Health Behavior/Discharge Planning: Goal: Identification of resources available to assist in meeting health care needs will improve Outcome: Progressing   Problem: Medication: Goal: Compliance with prescribed medication regimen will improve Outcome: Progressing   Problem: Self-Concept: Goal: Ability to disclose and discuss suicidal ideas will improve Outcome: Progressing Goal: Will verbalize positive feelings about self Outcome: Progressing Note: Patient is on track. Patient will maintain  adherence

## 2022-11-16 NOTE — Progress Notes (Signed)
Patient appears depressed. Patient denies HI/VH. Pt endorses passive SI with no plan or intention. Pt endorses hearing his mother's voice say to him "come home". Pt reports this is unsettling. Pt reports anxiety is 8/10 and depression is 10/10. Pt reports poor sleep and poor appetite. Patient complied with morning medication with no reported side effects. Patient remains safe on Q33min checks and contracts for safety.      11/16/22 0927  Psych Admission Type (Psych Patients Only)  Admission Status Voluntary  Psychosocial Assessment  Patient Complaints Sleep disturbance;Appetite decrease;Depression;Anxiety  Eye Contact Fair  Facial Expression Anxious  Affect Anxious  Speech Logical/coherent  Interaction Assertive  Appearance/Hygiene In scrubs  Behavior Characteristics Cooperative;Anxious  Mood Depressed;Anxious  Thought Process  Coherency WDL  Content WDL  Delusions None reported or observed  Perception WDL  Hallucination Auditory  Judgment Impaired  Confusion None  Danger to Self  Current suicidal ideation? Passive  Agreement Not to Harm Self Yes  Description of Agreement verbal  Danger to Others  Danger to Others None reported or observed

## 2022-11-16 NOTE — [facility] Group Notes (Signed)
[facility] Group Notes:  (Nursing/MHT/Case Management/Adjunct)  Date:  11/16/2022  Time:  2:13 PM  Type of Therapy:  Psychoeducational Skills  Participation Level:  Did Not Attend    Summary of Progress/Problems: Pt did not attend afternoon nursing group.  [name] 11/16/2022, 2:13 PM

## 2022-11-16 NOTE — Telephone Encounter (Signed)
Sent mychart message

## 2022-11-16 NOTE — Group Note (Signed)
Recreation Therapy Group Note   Group Topic:Animal Assisted Therapy   Group Date: 11/16/2022 Start Time: 6010 End Time: 1035 Facilitators: Winry Egnew-McCall, LRT,CTRS Location: [address] Dayroom   Animal-Assisted Activity (AAA) Program Checklist/Progress Notes Patient Eligibility Criteria Checklist & Daily Group note for Rec Tx Intervention  AAA/T Program Assumption of Risk Form signed by Patient/ or Parent Legal Guardian Yes  Patient understands his/her participation is voluntary Yes  Education: Charity fundraiser, Appropriate Animal Interaction   Education Outcome: Acknowledges education.    Affect/Mood: N/A   Participation Level: Did not attend    Clinical Observations/Individualized Feedback:    Plan: Continue to engage patient in RT group sessions 2-3x/week.   [name], LRT,CTRS 11/16/2022 1:11 PM

## 2022-11-16 NOTE — [facility] Group Notes (Signed)
Adult Psychoeducational Group Note  Date:  11/16/2022 Time:  9:03 PM  Group Topic/Focus:  Wrap-Up Group:   The focus of this group is to help patients review their daily goal of treatment and discuss progress on daily workbooks.  Participation Level:  Active  Participation Quality:  Appropriate  Affect:  Appropriate  Cognitive:  Appropriate  Insight: Appropriate  Engagement in Group:  Engaged  Modes of Intervention:  Discussion  Additional Comments:  Pt, attended group, stated  day was 5 out of 10. Pt stated today was a good day because they connected with others.   Joselyn Arrow 11/16/2022, 9:03 PM

## 2022-11-16 NOTE — Progress Notes (Signed)
[facility] MD Progress Note  11/16/2022 2:03 PM [name]  MRN:  [id number] Subjective:  [name] was seen today in his room on rounds and again later in the common area. He is having a bad headache today, with some dizziness. Also having constipation. Milk of magnesia didn't help. He had a "little red pill" a few days ago that did help. He still has suicidal ideation, passive and says "I always have that". He is not sleeping or eating very well today. No hallucinations. His mood is "like a swing thing" today. He has a lot of anxiety.   Principal Problem: MDD (major depressive disorder), recurrent severe, without psychosis (HCC) Diagnosis: Principal Problem:   MDD (major depressive disorder), recurrent severe, without psychosis (HCC) Active Problems:   Moderate cocaine use disorder (HCC)   Alcohol dependence (HCC)  Total Time spent with patient: 30 minutes  Past Psychiatric History: Prior Psychiatric diagnoses: none Past Psychiatric Hospitalizations: once 20 yrs ago after cut his wrist in suicide attempt   History of self mutilation: denies Past suicide attempts: x 2, 20 yrs ago cut his wrist, 1 and 1/2 yrs ago by OD on fentanyl Past history of HI, violent or aggressive behavior: denies   Past Psychiatric medications trials: none History of ECT/TMS: denies   Outpatient psychiatric Follow up: denies Prior Outpatient Therapy: denies  Past Medical History:  Past Medical History:  Diagnosis Date   Acid reflux    Emphysema of lung (HCC)    Head injury    Hepatitis C    S/p treatment with Epclusa in 2020   Hiatal hernia    HTN (hypertension)    Pulmonary nodules    Stroke [facility])     Past Surgical History:  Procedure Laterality Date   ACROMIO-CLAVICULAR JOINT REPAIR Right 06/28/2018   Procedure: ACROMIO-CLAVICULAR JOINT IRRIGATION AND DEBRIDEMENT;  Surgeon: Cammy Copa, MD;  Location: [facility] OR;  Service: Orthopedics;  Laterality: Right;   FACIAL FRACTURE SURGERY     IRRIGATION AND  DEBRIDEMENT SHOULDER Right 06/28/2018   Procedure: IRRIGATION AND DEBRIDEMENT SHOULDER;  Surgeon: Cammy Copa, MD;  Location: [facility] OR;  Service: Orthopedics;  Laterality: Right;   KNEE ARTHROSCOPY Left 06/23/2018   Procedure: LEFT KNEE ARTHROSCOPY KNEE I&D.;  Surgeon: Cammy Copa, MD;  Location: [facility] OR;  Service: Orthopedics;  Laterality: Left;   Family History:  Family History  Problem Relation Age of Onset   Diabetes Mother    Hypertension Mother    Stroke Father    Hypertension Father    Colon cancer Neg Hx    Family Psychiatric  History: Psychiatric illness: denies Suicide: has a notable family history of suicide, with both his maternal grandfather and paternal uncle having taken their own lives.  Substance Abuse: multiple male family members with alcohol dependence Social History:  Social History   Substance and Sexual Activity  Alcohol Use Yes   Comment: 6 pack a week; but last drank 08/12/22.     Social History   Substance and Sexual Activity  Drug Use Yes   Types: Cocaine   Comment: states its "been a long time"     Social History   Socioeconomic History   Marital status: Single    Spouse name: Not on file   Number of children: Not on file   Years of education: Not on file   Highest education level: Not on file  Occupational History   Not on file  Tobacco Use   Smoking status: Some Days  Current packs/day: 2.00    Average packs/day: 2.0 packs/day for 30.0 years (60.0 ttl pk-yrs)    Types: Cigarettes   Smokeless tobacco: Never   Tobacco comments:    1 cig a day since the last visit.  Vaping Use   Vaping status: Never Used  Substance and Sexual Activity   Alcohol use: Yes    Comment: 6 pack a week; but last drank 08/12/22.   Drug use: Yes    Types: Cocaine    Comment: states its "been a long time"    Sexual activity: Not Currently  Other Topics Concern   Not on file  Social History Narrative   Not on file   Social Determinants of Health    Financial Resource Strain: Low Risk  (06/01/2022)   Overall Financial Resource Strain (CARDIA)    Difficulty of Paying Living Expenses: Not very hard  Food Insecurity: No Food Insecurity (11/11/2022)   Hunger Vital Sign    Worried About Running Out of Food in the Last Year: Never true    Ran Out of Food in the Last Year: Never true  Transportation Needs: No Transportation Needs (11/11/2022)   PRAPARE - Administrator, Civil Service (Medical): No    Lack of Transportation (Non-Medical): No  Physical Activity: Insufficiently Active (06/01/2022)   Exercise Vital Sign    Days of Exercise per Week: 7 days    Minutes of Exercise per Session: 10 min  Stress: No Stress Concern Present (06/01/2022)   [employer] of Occupational Health - Occupational Stress Questionnaire    Feeling of Stress : Only a little  Social Connections: Moderately Isolated (06/01/2022)   Social Connection and Isolation Panel [NHANES]    Frequency of Communication with Friends and Family: More than three times a week    Frequency of Social Gatherings with Friends and Family: More than three times a week    Attends Religious Services: Never    Database administrator or Organizations: No    Attends Engineer, structural: Never    Marital Status: Living with partner   Additional Social History:                         Sleep: Poor  Appetite:  Poor  Current Medications: Current Facility-Administered Medications  Medication Dose Route Frequency Provider Last Rate Last Admin   acetaminophen (TYLENOL) tablet 650 mg  650 mg Oral Q6H PRN Onuoha, Chinwendu V, NP   650 mg at 11/16/22 0615   alum & mag hydroxide-simeth (MAALOX/MYLANTA) 200-200-20 MG/5ML suspension 15 mL  15 mL Oral Q4H PRN Onuoha, Chinwendu V, NP   15 mL at 11/11/22 1154   amLODipine (NORVASC) tablet 10 mg  10 mg Oral Daily Attiah, Nadir, MD   10 mg at 11/16/22 0745   atorvastatin (LIPITOR) tablet 20 mg  20 mg Oral Daily Marcey Persad,  Shelbie Hutching, MD       cephALEXin (KEFLEX) capsule 500 mg  500 mg Oral Q12H Attiah, Nadir, MD   500 mg at 11/16/22 0746   docusate sodium (COLACE) capsule 100 mg  100 mg Oral Daily Lateka Rady, Shelbie Hutching, MD       FLUoxetine (PROZAC) capsule 20 mg  20 mg Oral Daily Attiah, Nadir, MD   20 mg at 11/16/22 0746   gabapentin (NEURONTIN) capsule 200 mg  200 mg Oral TID Abbott Pao, Nadir, MD   200 mg at 11/16/22 0746   hydrOXYzine (ATARAX) tablet 25 mg  25 mg Oral TID PRN Sarita Bottom, MD   25 mg at 11/15/22 1301   hydrOXYzine (ATARAX) tablet 50 mg  50 mg Oral BID Abbott Pao, Nadir, MD   50 mg at 11/16/22 0746   ibuprofen (ADVIL) tablet 800 mg  800 mg Oral Q8H PRN Abbott Pao, Nadir, MD   800 mg at 11/16/22 0940   LORazepam (ATIVAN) tablet 2 mg  2 mg Oral TID PRN Onuoha, Chinwendu V, NP       Or   LORazepam (ATIVAN) injection 2 mg  2 mg Intramuscular TID PRN Onuoha, Chinwendu V, NP       magnesium hydroxide (MILK OF MAGNESIA) suspension 15 mL  15 mL Oral Daily PRN Onuoha, Chinwendu V, NP   15 mL at 11/13/22 1636   multivitamin with minerals tablet 1 tablet  1 tablet Oral Daily Onuoha, Chinwendu V, NP   1 tablet at 11/16/22 0746   ondansetron (ZOFRAN-ODT) disintegrating tablet 4 mg  4 mg Oral Q6H PRN Abbott Pao, Nadir, MD   4 mg at 11/15/22 2000   pantoprazole (PROTONIX) EC tablet 40 mg  40 mg Oral Daily Attiah, Nadir, MD   40 mg at 11/16/22 0746   risperiDONE (RISPERDAL) tablet 1 mg  1 mg Oral QHS Attiah, Nadir, MD   1 mg at 11/15/22 2000   SUMAtriptan (IMITREX) tablet 25 mg  25 mg Oral Once Roselle Locus, MD       thiamine (Vitamin B-1) tablet 100 mg  100 mg Oral Daily Onuoha, Chinwendu V, NP   100 mg at 11/16/22 0745   traZODone (DESYREL) tablet 100 mg  100 mg Oral QHS PRN Sarita Bottom, MD   100 mg at 11/15/22 2000    Lab Results:  Results for orders placed or performed during the hospital encounter of 11/11/22 (from the past 48 hour(s))  Hemoglobin A1c     Status: None   Collection Time: 11/16/22   6:22 AM  Result Value Ref Range   Hgb A1c MFr Bld 5.5 4.8 - 5.6 %    Comment: (NOTE) Pre diabetes:          5.7%-6.4%  Diabetes:              >6.4%  Glycemic control for   <7.0% adults with diabetes    Mean Plasma Glucose 111.15 mg/dL    Comment: Performed at [facility], [address] [place], [place] [address]  Lipid panel     Status: Abnormal   Collection Time: 11/16/22  6:22 AM  Result Value Ref Range   Cholesterol 221 (H) 0 - 200 mg/dL   Triglycerides 102 (H) <150 mg/dL   HDL 49 >72 mg/dL   Total CHOL/HDL Ratio 4.5 RATIO   VLDL 61 (H) 0 - 40 mg/dL   LDL Cholesterol 536 (H) 0 - 99 mg/dL    Comment:        Total Cholesterol/HDL:CHD Risk Coronary Heart Disease Risk Table                     Men   Women  1/2 Average Risk   3.4   3.3  Average Risk       5.0   4.4  2 X Average Risk   9.6   7.1  3 X Average Risk  23.4   11.0        Use the calculated Patient Ratio above and the CHD Risk Table to determine the patient's CHD Risk.  ATP III CLASSIFICATION (LDL):  <100     mg/dL   Optimal  161-096  mg/dL   Near or Above                    Optimal  130-159  mg/dL   Borderline  045-409  mg/dL   High  >811     mg/dL   Very High Performed at [facility], [address], [place], [place] [address]     Blood Alcohol level:  Lab Results  Component Value Date   [facility] <10 11/10/2022   ETH <10 12/30/2021    Metabolic Disorder Labs: Lab Results  Component Value Date   HGBA1C 5.5 11/16/2022   MPG 111.15 11/16/2022   MPG 111.15 10/22/2021   No results found for: "PROLACTIN" Lab Results  Component Value Date   CHOL 221 (H) 11/16/2022   TRIG 303 (H) 11/16/2022   HDL 49 11/16/2022   CHOLHDL 4.5 11/16/2022   VLDL 61 (H) 11/16/2022   LDLCALC 111 (H) 11/16/2022   LDLCALC 97 10/22/2021    Physical Findings: AIMS: Facial and Oral Movements Muscles of Facial Expression: None Lips and Perioral Area: None Jaw: None Tongue: None,Extremity  Movements Upper (arms, wrists, hands, fingers): None Lower (legs, knees, ankles, toes): None, Trunk Movements Neck, shoulders, hips: None, Global Judgements Severity of abnormal movements overall : None Incapacitation due to abnormal movements: None Patient's awareness of abnormal movements: No Awareness, Dental Status Current problems with teeth and/or dentures?: No Does patient usually wear dentures?: No  CIWA:  CIWA-Ar Total: 8 COWS:     Musculoskeletal: Strength & Muscle Tone: within normal limits Gait & Station: normal Patient leans: N/A  Psychiatric Specialty Exam:  Presentation  General Appearance:  Disheveled  Eye Contact: Fair  Speech: Normal Rate  Speech Volume: Decreased  Handedness: Right   Mood and Affect  Mood: Depressed  Affect: Depressed   Thought Process  Thought Processes: Goal Directed  Descriptions of Associations:Intact  Orientation:Full (Time, Place and Person)  Thought Content:Logical  History of Schizophrenia/Schizoaffective disorder:No data recorded Duration of Psychotic Symptoms:No data recorded Hallucinations:Hallucinations: None  Ideas of Reference:None  Suicidal Thoughts:Suicidal Thoughts: Yes, Passive SI Passive Intent and/or Plan: Without Plan  Homicidal Thoughts:Homicidal Thoughts: No   Sensorium  Memory: Immediate Fair; Recent Fair  Judgment: Fair  Insight: Fair   Chartered certified accountant: Fair  Attention Span: Fair  Recall: [employer] of Knowledge: Fair  Language: Fair   Psychomotor Activity  Psychomotor Activity: Psychomotor Activity: Decreased   Assets  Assets: Desire for Improvement   Sleep  Sleep: Sleep: Poor    Physical Exam: Physical Exam Vitals and nursing note reviewed.  HENT:     Head: Normocephalic.  Eyes:     Extraocular Movements: Extraocular movements intact.  Pulmonary:     Effort: Pulmonary effort is normal.  Musculoskeletal:         General: Normal range of motion.     Cervical back: Normal range of motion.  Neurological:     General: No focal deficit present.     Mental Status: He is alert and oriented to person, place, and time.  Psychiatric:        Behavior: Behavior normal.    Review of Systems  Constitutional:  Negative for fever.  HENT:  Negative for congestion and sore throat.   Respiratory:  Negative for cough.   Cardiovascular:  Negative for chest pain.  Gastrointestinal:  Positive for constipation. Negative for abdominal  pain, diarrhea and vomiting.  Musculoskeletal:  Negative for myalgias.  Neurological:  Positive for dizziness and headaches.  Psychiatric/Behavioral:  Positive for suicidal ideas.    Blood pressure (!) 126/93, pulse 96, temperature (!) 97.5 F (36.4 C), temperature source Oral, resp. rate 16, height 5\' 10"  (1.778 m), weight 81.2 kg, SpO2 99%. Body mass index is 25.68 kg/m.   Treatment Plan Summary: Daily contact with patient to assess and evaluate symptoms and progress in treatment and Medication management   ASSESSMENT:   Diagnoses / Active Problems: Principal Problem: MDD (major depressive disorder), recurrent severe, without psychosis (HCC) Diagnosis: Principal Problem:   MDD (major depressive disorder), recurrent severe, without psychosis (HCC) Active Problems:   Moderate cocaine use disorder (HCC)   Alcohol dependence (HCC)     PLAN: Safety and Monitoring:             -- Voluntary admission to inpatient psychiatric unit for safety, stabilization and treatment             -- Daily contact with patient to assess and evaluate symptoms and progress in treatment             -- Patient's case to be discussed in multi-disciplinary team meeting             -- Observation Level : q15 minute checks             -- Vital signs:  q12 hours             -- Precautions: suicide, elopement, and assault   Medications:              Continue Prozac 20 mg daily for depression and  long-term treatment of anxiety             continue risperidone 1 mg at bedtime to help with mood and psychosis and monitor effects and safety and titrate dosing accordingly             Continue gabapentin 200 mg 3 times daily for alcohol dependence and craving as well as chronic pain, also will help with mood and anxiety             Stop trazodone, trial of remeron. Trazodone may cause headaches. Also added melatonin  Trial of one time dose of imitrex 25mg  for headache  Started colace 100mg  PO daily for constipation             continue Vistaril 50 mg twice daily scheduled for anxiety at 8 AM and 4 PM.   continue Vistaril 25 mg 3 times daily as needed for anxiety          Continue Ativan taper for alcohol withdrawals and continue to monitor CIWA score             CIWA protocol for alcohol withdrawals and follow-up            Continue pantoprazole 40 mg daily, home medication for GERD           Continue Norvasc 10 mg daily for hypertension, home medication             Continue Keflex 500 mg twice daily for 7 days for finger infection, will monitor -- The risks/benefits/side-effects/alternatives to this medication were discussed in detail with the patient and time was given for questions. The patient consents to medication trial.  3. Labs Reviewed: lipid panel. Started lipitor 20mg  PO QHS      Lab ordered: n/a   4. Tobacco Use Disorder             -- Patient quit smoking 2 weeks ago, declines NicoDerm patch at this time, will follow             -- Smoking cessation encouraged   5. Group and Therapy: -- Encouraged patient to participate in unit milieu and in scheduled group therapies              --Substance Use counseling: Patient was counseled regarding need to abstain completely from alcohol marijuana and cocaine after discharge.   Patient is interested to go to inpatient residential rehab for substance use rehab after discharge if that is not successful he is  in agreement with intensive outpatient rehab treatment, will follow.   6. Discharge Planning:              -- Social work and case management to assist with discharge planning and identification of hospital follow-up needs prior to discharge             -- Estimated LOS: 5-7 days             -- Discharge Concerns: Need to establish a safety plan; Medication compliance and effectiveness             -- Discharge Goals: Return home with outpatient referrals for mental health follow-up including medication management/psychotherapy     The patient is agreeable with the medication plan, as above. We will monitor the patient's response to pharmacologic treatment, and adjust medications as necessary. Patient is encouraged to participate in group therapy while admitted to the psychiatric unit. We will address other chronic and acute stressors, which contributed to the patient's worsening anxiety and depression, SI, in order to reduce the risk of self-harm at discharge.     Physician Treatment Plan for Primary Diagnosis: MDD (major depressive disorder), recurrent severe, without psychosis (HCC) Long Term Goal(s): Improvement in symptoms so as ready for discharge   Short Term Goals: Ability to identify changes in lifestyle to reduce recurrence of condition will improve, Ability to verbalize feelings will improve, Ability to disclose and discuss suicidal ideas, Ability to demonstrate self-control will improve, and Ability to identify and develop effective coping behaviors will improve     I certify that inpatient services furnished can reasonably be expected to improve the patient's condition.    Roselle Locus, MD 11/16/2022, 2:03 PM

## 2022-11-16 NOTE — Progress Notes (Addendum)
Pt reports persistent headache 8/10 pain over the last several days. Educated pt on withdrawal symptoms and how often prn medications can be given. Pt verbalized understanding.

## 2022-11-16 NOTE — BHH Counselor (Signed)
  11/16/2022  3:18 PM   [name]  Type of note: Treatment center referrals   CSW reached out to [facility] and [facility] with Pt and Pt was accepted into [facility] but declined due to cost. Pt has a phone interview with [facility] tomorrow, 10.9.24, @ 9am. CSW will follow up with Pt tomorrow after phone call.   Signed:  Marya Landry MSW, [facility] 11/16/2022  3:18 PM

## 2022-11-17 ENCOUNTER — Telehealth: Payer: Self-pay | Admitting: *Deleted

## 2022-11-17 ENCOUNTER — Encounter (HOSPITAL_COMMUNITY): Payer: Self-pay

## 2022-11-17 DIAGNOSIS — F332 Major depressive disorder, recurrent severe without psychotic features: Secondary | ICD-10-CM | POA: Diagnosis not present

## 2022-11-17 MED ORDER — BISACODYL 10 MG RE SUPP
10.0000 mg | Freq: Once | RECTAL | Status: AC
  Administered 2022-11-17: 10 mg via RECTAL
  Filled 2022-11-17 (×2): qty 1

## 2022-11-17 NOTE — Progress Notes (Signed)
Pt presents with anxious affect today. Pt reports improvement in withdrawal symptoms. Pt denies suicidal thoughts. Pt initially denied AH but is hearing his mothers voice"calling him home" this afternoon. Pt endorses marked difficulty with grief/loss topic during group this afternoon. Pt utilizing prn hydroxyzine. Q 15 minute checks ongoing.

## 2022-11-17 NOTE — Telephone Encounter (Signed)
Pt is in behavioral health for attempted suicide. I have been trying to get in touch with him to schedule his procedure.  FYI

## 2022-11-17 NOTE — Telephone Encounter (Signed)
Noted. This can be scheduled after he is discharged. Colonoscopy is not urgent.

## 2022-11-17 NOTE — BHH Group Notes (Signed)
Spiritual care group on grief and loss facilitated by Chaplain Dyanne Carrel, Bcc and Arlyce Dice, Mdiv  Group Goal: Support / Education around grief and loss  Members engage in facilitated group support and psycho-social education.  Group Description:  Following introductions and group rules, group members engaged in facilitated group dialogue and support around topic of loss, with particular support around experiences of loss in their lives. Group Identified types of loss (relationships / self / things) and identified patterns, circumstances, and changes that precipitate losses. Reflected on thoughts / feelings around loss, normalized grief responses, and recognized variety in grief experience. Group encouraged individual reflection on safe space and on the coping skills that they are already utilizing.  Group drew on Adlerian / Rogerian and narrative framework  Patient Progress: [name] attended the first part of group.  He left shortly after it began, but was engaged during the time he was present.

## 2022-11-17 NOTE — Telephone Encounter (Signed)
noted 

## 2022-11-17 NOTE — BHH Group Notes (Signed)
Adult Psychoeducational Group Note  Date:  11/17/2022 Time:  9:11 PM  Group Topic/Focus:  Wrap-Up Group:   The focus of this group is to help patients review their daily goal of treatment and discuss progress on daily workbooks.  Participation Level:  Active  Participation Quality:  Attentive  Affect:  Appropriate  Cognitive:  Alert  Insight: Appropriate  Engagement in Group:  Engaged  Modes of Intervention:  Discussion  Additional Comments:  Patient attended and participated in the Wrap-up group.  Jearl Klinefelter 11/17/2022, 9:11 PM

## 2022-11-17 NOTE — Group Note (Signed)
Recreation Therapy Group Note   Group Topic:Team Building  Group Date: 11/17/2022 Start Time: 1011 End Time: 1035 Facilitators: Cobe Viney-McCall, LRT,CTRS Location: [address] Dayroom   Goal Area(s) Addresses:  Patient will effectively work with peer towards shared goal.  Patient will identify skills used to make activity successful.  Patient will identify how skills used during activity can be used to reach post d/c goals.   Intervention: STEM Activity  Group Description: Straw Bridge. In teams of 3-5, patients were given 15 plastic drinking straws and an equal length of masking tape. Using the materials provided, patients were instructed to build a free standing bridge-like structure to suspend an everyday item (ex: puzzle box) off of the floor or table surface. All materials were required to be used by the team in their design. LRT facilitated post-activity discussion reviewing team process. Patients were encouraged to reflect how the skills used in this activity can be generalized to daily life post discharge.   Education: Pharmacist, community, Scientist, physiological, Discharge Planning   Education Outcome: Acknowledges education/In group clarification offered/Needs additional education.    Affect/Mood: N/A   Participation Level: Did not attend    Clinical Observations/Individualized Feedback:     Plan: Continue to engage patient in RT group sessions 2-3x/week.   Virl Coble-McCall, LRT,CTRS 11/17/2022 1:03 PM

## 2022-11-17 NOTE — Plan of Care (Signed)
  Problem: Education: Goal: Knowledge of [facility] Education information/materials will improve Outcome: Progressing Goal: Emotional status will improve Outcome: Progressing Goal: Mental status will improve Outcome: Progressing Goal: Verbalization of understanding the information provided will improve Outcome: Progressing   Problem: Activity: Goal: Interest or engagement in activities will improve Outcome: Progressing Goal: Sleeping patterns will improve Outcome: Progressing   Problem: Coping: Goal: Ability to verbalize frustrations and anger appropriately will improve Outcome: Progressing Goal: Ability to demonstrate self-control will improve Outcome: Progressing   Problem: Health Behavior/Discharge Planning: Goal: Identification of resources available to assist in meeting health care needs will improve Outcome: Progressing Goal: Compliance with treatment plan for underlying cause of condition will improve Outcome: Progressing   Problem: Physical Regulation: Goal: Ability to maintain clinical measurements within normal limits will improve Outcome: Progressing   Problem: Safety: Goal: Periods of time without injury will increase Outcome: Progressing   Problem: Education: Goal: Utilization of techniques to improve thought processes will improve Outcome: Progressing Goal: Knowledge of the prescribed therapeutic regimen will improve Outcome: Progressing   Problem: Activity: Goal: Interest or engagement in leisure activities will improve Outcome: Progressing Goal: Imbalance in normal sleep/wake cycle will improve Outcome: Progressing   Problem: Coping: Goal: Coping ability will improve Outcome: Progressing Goal: Will verbalize feelings Outcome: Progressing   Problem: Health Behavior/Discharge Planning: Goal: Ability to make decisions will improve Outcome: Progressing Goal: Compliance with therapeutic regimen will improve Outcome: Progressing    Problem: Role Relationship: Goal: Will demonstrate positive changes in social behaviors and relationships Outcome: Progressing   Problem: Safety: Goal: Ability to disclose and discuss suicidal ideas will improve Outcome: Progressing Goal: Ability to identify and utilize support systems that promote safety will improve Outcome: Progressing   Problem: Self-Concept: Goal: Will verbalize positive feelings about self Outcome: Progressing Goal: Level of anxiety will decrease Outcome: Progressing   Problem: Education: Goal: Knowledge of disease or condition will improve Outcome: Progressing Goal: Understanding of discharge needs will improve Outcome: Progressing   Problem: Health Behavior/Discharge Planning: Goal: Ability to identify changes in lifestyle to reduce recurrence of condition will improve Outcome: Progressing Goal: Identification of resources available to assist in meeting health care needs will improve Outcome: Progressing   Problem: Physical Regulation: Goal: Complications related to the disease process, condition or treatment will be avoided or minimized Outcome: Progressing   Problem: Safety: Goal: Ability to remain free from injury will improve Outcome: Progressing   Problem: Education: Goal: Ability to make informed decisions regarding treatment will improve Outcome: Progressing   Problem: Coping: Goal: Coping ability will improve Outcome: Progressing   Problem: Health Behavior/Discharge Planning: Goal: Identification of resources available to assist in meeting health care needs will improve Outcome: Progressing   Problem: Medication: Goal: Compliance with prescribed medication regimen will improve Outcome: Progressing   Problem: Self-Concept: Goal: Ability to disclose and discuss suicidal ideas will improve Outcome: Progressing Goal: Will verbalize positive feelings about self Outcome: Progressing Note: Patient is on track. Patient will maintain  adherence

## 2022-11-17 NOTE — Group Note (Signed)
Date:  11/17/2022 Time:  8:43 AM  Group Topic/Focus:  Goals Group:   The focus of this group is to help patients establish daily goals to achieve during treatment and discuss how the patient can incorporate goal setting into their daily lives to aide in recovery.    Participation Level:  Active  Participation Quality:  Appropriate  Affect:  Appropriate  Cognitive:  Appropriate  Insight: Appropriate  Engagement in Group:  Engaged  Modes of Intervention:  Discussion  Additional Comments:    [name] 11/17/2022, 8:43 AM

## 2022-11-17 NOTE — BHH Counselor (Signed)
  11/17/2022  1:19 PM   [name]  Type of note: referrals   Pt was accepted in to [facility] but declined due to cost. Pt also completed a phone interview for [facility] and is awaiting approval.   Signed:  Marya Landry MSW, LCSWA 11/17/2022  1:19 PM

## 2022-11-17 NOTE — Progress Notes (Signed)
[facility] MD Progress Note  11/17/2022 1:47 PM Chaka Jefferys  MRN:  [id number] Subjective:  Sarath was seen today in the common area on rounds. He feels that he is doing "okay" but still has a headache. Imitrex yesterday did not help. He is trying to stay hydrated. He still has not had a bowel movement and now it is 4 days. He has used suppositories at home and is willing to try. He didn't sleep very well overnight due to disruptions. He is eating okay. No hallucinations. Denies current suicidal ideation.  Principal Problem: MDD (major depressive disorder), recurrent severe, without psychosis (HCC) Diagnosis: Principal Problem:   MDD (major depressive disorder), recurrent severe, without psychosis (HCC) Active Problems:   Moderate cocaine use disorder (HCC)   Alcohol dependence (HCC)  Total Time spent with patient: 20 minutes  Past Psychiatric History: Prior Psychiatric diagnoses: none Past Psychiatric Hospitalizations: once 20 yrs ago after cut his wrist in suicide attempt   History of self mutilation: denies Past suicide attempts: x 2, 20 yrs ago cut his wrist, 1 and 1/2 yrs ago by OD on fentanyl Past history of HI, violent or aggressive behavior: denies   Past Psychiatric medications trials: none History of ECT/TMS: denies   Outpatient psychiatric Follow up: denies Prior Outpatient Therapy: denies  Past Medical History:  Past Medical History:  Diagnosis Date   Acid reflux    Emphysema of lung (HCC)    Head injury    Hepatitis C    S/p treatment with Epclusa in 2020   Hiatal hernia    HTN (hypertension)    Pulmonary nodules    Stroke [facility])     Past Surgical History:  Procedure Laterality Date   ACROMIO-CLAVICULAR JOINT REPAIR Right 06/28/2018   Procedure: ACROMIO-CLAVICULAR JOINT IRRIGATION AND DEBRIDEMENT;  Surgeon: Cammy Copa, MD;  Location: [facility] OR;  Service: Orthopedics;  Laterality: Right;   FACIAL FRACTURE SURGERY     IRRIGATION AND DEBRIDEMENT SHOULDER Right  06/28/2018   Procedure: IRRIGATION AND DEBRIDEMENT SHOULDER;  Surgeon: Cammy Copa, MD;  Location: [facility] OR;  Service: Orthopedics;  Laterality: Right;   KNEE ARTHROSCOPY Left 06/23/2018   Procedure: LEFT KNEE ARTHROSCOPY KNEE I&D.;  Surgeon: Cammy Copa, MD;  Location: [name] OR;  Service: Orthopedics;  Laterality: Left;   Family History:  Family History  Problem Relation Age of Onset   Diabetes Mother    Hypertension Mother    Stroke Father    Hypertension Father    Colon cancer Neg Hx    Family Psychiatric  History: Psychiatric illness: denies Suicide: has a notable family history of suicide, with both his maternal grandfather and paternal uncle having taken their own lives.  Substance Abuse: multiple male family members with alcohol dependence Social History:  Social History   Substance and Sexual Activity  Alcohol Use Yes   Comment: 6 pack a week; but last drank 08/12/22.     Social History   Substance and Sexual Activity  Drug Use Yes   Types: Cocaine   Comment: states its "been a long time"     Social History   Socioeconomic History   Marital status: Single    Spouse name: Not on file   Number of children: Not on file   Years of education: Not on file   Highest education level: Not on file  Occupational History   Not on file  Tobacco Use   Smoking status: Some Days    Current packs/day: 2.00  Average packs/day: 2.0 packs/day for 30.0 years (60.0 ttl pk-yrs)    Types: Cigarettes   Smokeless tobacco: Never   Tobacco comments:    1 cig a day since the last visit.  Vaping Use   Vaping status: Never Used  Substance and Sexual Activity   Alcohol use: Yes    Comment: 6 pack a week; but last drank 08/12/22.   Drug use: Yes    Types: Cocaine    Comment: states its "been a long time"    Sexual activity: Not Currently  Other Topics Concern   Not on file  Social History Narrative   Not on file   Social Determinants of Health   Financial Resource  Strain: Low Risk  (06/01/2022)   Overall Financial Resource Strain (CARDIA)    Difficulty of Paying Living Expenses: Not very hard  Food Insecurity: No Food Insecurity (11/11/2022)   Hunger Vital Sign    Worried About Running Out of Food in the Last Year: Never true    Ran Out of Food in the Last Year: Never true  Transportation Needs: No Transportation Needs (11/11/2022)   PRAPARE - Administrator, Civil Service (Medical): No    Lack of Transportation (Non-Medical): No  Physical Activity: Insufficiently Active (06/01/2022)   Exercise Vital Sign    Days of Exercise per Week: 7 days    Minutes of Exercise per Session: 10 min  Stress: No Stress Concern Present (06/01/2022)   [employer] of Occupational Health - Occupational Stress Questionnaire    Feeling of Stress : Only a little  Social Connections: Moderately Isolated (06/01/2022)   Social Connection and Isolation Panel [NHANES]    Frequency of Communication with Friends and Family: More than three times a week    Frequency of Social Gatherings with Friends and Family: More than three times a week    Attends Religious Services: Never    Database administrator or Organizations: No    Attends Engineer, structural: Never    Marital Status: Living with partner   Additional Social History:   Living situation: Was living with his girlfriend in [place] but will not be going back there "my daughter is trying to find me a place" Social support: His oldest daughter [name] is supportive, she lives in [place] Marital Status: Was married once for about 20 years and got divorced 22 years ago Children: 6 children 2 sons and 4 daughters ages 63 years old to 19 years old, he only has relationship with his oldest daughter [name] who is supportive Education: 10th grade Employment: Currently on disability for the past year for medical reasons Military service: Denies Legal history: Has been to jail and the present several  times last time to jail 3 years ago last time to prison 20 years ago for "running guns" has court on October 9 for DUI Trauma: Denies Access to guns: Has guns at girlfriend's house for hunting but he does not plan to go back there    Sleep: Poor  Appetite:  Fair  Current Medications: Current Facility-Administered Medications  Medication Dose Route Frequency Provider Last Rate Last Admin   acetaminophen (TYLENOL) tablet 650 mg  650 mg Oral Q6H PRN Onuoha, Chinwendu V, NP   650 mg at 11/17/22 0443   alum & mag hydroxide-simeth (MAALOX/MYLANTA) 200-200-20 MG/5ML suspension 15 mL  15 mL Oral Q4H PRN Onuoha, Chinwendu V, NP   15 mL at 11/11/22 1154   amLODipine (NORVASC) tablet 10 mg  10 mg Oral Daily Abbott Pao, Nadir, MD   10 mg at 11/17/22 0751   atorvastatin (LIPITOR) tablet 20 mg  20 mg Oral Daily Haroun Cotham, Shelbie Hutching, MD   20 mg at 11/17/22 0752   cephALEXin (KEFLEX) capsule 500 mg  500 mg Oral Q12H Attiah, Nadir, MD   500 mg at 11/17/22 0752   docusate sodium (COLACE) capsule 100 mg  100 mg Oral Daily Roselle Locus, MD   100 mg at 11/17/22 0752   FLUoxetine (PROZAC) capsule 20 mg  20 mg Oral Daily Abbott Pao, Nadir, MD   20 mg at 11/17/22 0272   gabapentin (NEURONTIN) capsule 200 mg  200 mg Oral TID Sarita Bottom, MD   200 mg at 11/17/22 1256   hydrOXYzine (ATARAX) tablet 25 mg  25 mg Oral TID PRN Sarita Bottom, MD   25 mg at 11/17/22 1015   hydrOXYzine (ATARAX) tablet 50 mg  50 mg Oral BID Abbott Pao, Nadir, MD   50 mg at 11/17/22 5366   ibuprofen (ADVIL) tablet 800 mg  800 mg Oral Q8H PRN Abbott Pao, Nadir, MD   800 mg at 11/17/22 1255   LORazepam (ATIVAN) tablet 2 mg  2 mg Oral TID PRN Onuoha, Chinwendu V, NP       Or   LORazepam (ATIVAN) injection 2 mg  2 mg Intramuscular TID PRN Onuoha, Chinwendu V, NP       magnesium hydroxide (MILK OF MAGNESIA) suspension 15 mL  15 mL Oral Daily PRN Onuoha, Chinwendu V, NP   15 mL at 11/16/22 1821   melatonin tablet 5 mg  5 mg Oral QHS Ubaldo Daywalt, Shelbie Hutching, MD   5 mg at 11/16/22 2040   mirtazapine (REMERON) tablet 7.5 mg  7.5 mg Oral QHS PRN Roselle Locus, MD       multivitamin with minerals tablet 1 tablet  1 tablet Oral Daily Onuoha, Chinwendu V, NP   1 tablet at 11/17/22 0753   pantoprazole (PROTONIX) EC tablet 40 mg  40 mg Oral Daily Attiah, Nadir, MD   40 mg at 11/17/22 0752   risperiDONE (RISPERDAL) tablet 1 mg  1 mg Oral QHS Attiah, Nadir, MD   1 mg at 11/16/22 2041   thiamine (Vitamin B-1) tablet 100 mg  100 mg Oral Daily Onuoha, Chinwendu V, NP   100 mg at 11/17/22 4403    Lab Results:  Results for orders placed or performed during the hospital encounter of 11/11/22 (from the past 48 hour(s))  Hemoglobin A1c     Status: None   Collection Time: 11/16/22  6:22 AM  Result Value Ref Range   Hgb A1c MFr Bld 5.5 4.8 - 5.6 %    Comment: (NOTE) Pre diabetes:          5.7%-6.4%  Diabetes:              >6.4%  Glycemic control for   <7.0% adults with diabetes    Mean Plasma Glucose 111.15 mg/dL    Comment: Performed at [facility], [address] [place], [place] [address]  Lipid panel     Status: Abnormal   Collection Time: 11/16/22  6:22 AM  Result Value Ref Range   Cholesterol 221 (H) 0 - 200 mg/dL   Triglycerides 956 (H) <150 mg/dL   HDL 49 >38 mg/dL   Total CHOL/HDL Ratio 4.5 RATIO   VLDL 61 (H) 0 - 40 mg/dL   LDL Cholesterol 756 (H) 0 - 99 mg/dL  Comment:        Total Cholesterol/HDL:CHD Risk Coronary Heart Disease Risk Table                     Men   Women  1/2 Average Risk   3.4   3.3  Average Risk       5.0   4.4  2 X Average Risk   9.6   7.1  3 X Average Risk  23.4   11.0        Use the calculated Patient Ratio above and the CHD Risk Table to determine the patient's CHD Risk.        ATP III CLASSIFICATION (LDL):  <100     mg/dL   Optimal  409-811  mg/dL   Near or Above                    Optimal  130-159  mg/dL   Borderline  914-782  mg/dL   High  >956     mg/dL   Very High Performed  at [facility], [address], [place], [place] [address]     Blood Alcohol level:  Lab Results  Component Value Date   [facility] <10 11/10/2022   ETH <10 12/30/2021    Metabolic Disorder Labs: Lab Results  Component Value Date   HGBA1C 5.5 11/16/2022   MPG 111.15 11/16/2022   MPG 111.15 10/22/2021   No results found for: "PROLACTIN" Lab Results  Component Value Date   CHOL 221 (H) 11/16/2022   TRIG 303 (H) 11/16/2022   HDL 49 11/16/2022   CHOLHDL 4.5 11/16/2022   VLDL 61 (H) 11/16/2022   LDLCALC 111 (H) 11/16/2022   LDLCALC 97 10/22/2021    Physical Findings: AIMS: Facial and Oral Movements Muscles of Facial Expression: None Lips and Perioral Area: None Jaw: None Tongue: None,Extremity Movements Upper (arms, wrists, hands, fingers): None Lower (legs, knees, ankles, toes): None, Trunk Movements Neck, shoulders, hips: None, Global Judgements Severity of abnormal movements overall : None Incapacitation due to abnormal movements: None Patient's awareness of abnormal movements: No Awareness, Dental Status Current problems with teeth and/or dentures?: No Does patient usually wear dentures?: No  CIWA:  CIWA-Ar Total: 0 COWS:     Musculoskeletal: Strength & Muscle Tone: within normal limits Gait & Station: normal Patient leans: N/A  Psychiatric Specialty Exam:  Presentation  General Appearance:  Casual  Eye Contact: Fair  Speech: Normal Rate  Speech Volume: Decreased  Handedness: Right   Mood and Affect  Mood: Depressed  Affect: Depressed   Thought Process  Thought Processes: Linear  Descriptions of Associations:Intact  Orientation:Full (Time, Place and Person)  Thought Content:Logical  History of Schizophrenia/Schizoaffective disorder:No data recorded Duration of Psychotic Symptoms:No data recorded Hallucinations:Hallucinations: None  Ideas of Reference:None  Suicidal Thoughts:Suicidal Thoughts: No SI Passive  Intent and/or Plan: Without Plan  Homicidal Thoughts:Homicidal Thoughts: No   Sensorium  Memory: Immediate Good; Recent Fair  Judgment: Fair  Insight: Fair   Chartered certified accountant: Fair  Attention Span: Fair  Recall: Fair  Fund of Knowledge: Good  Language: Good   Psychomotor Activity  Psychomotor Activity: Psychomotor Activity: Decreased   Assets  Assets: Communication Skills; Desire for Improvement   Sleep  Sleep: Sleep: Poor    Physical Exam: Physical Exam Vitals and nursing note reviewed.  Constitutional:      Appearance: Normal appearance.  HENT:     Head: Normocephalic and atraumatic.  Eyes:  Extraocular Movements: Extraocular movements intact.  Pulmonary:     Effort: Pulmonary effort is normal.  Musculoskeletal:        General: Normal range of motion.     Cervical back: Normal range of motion.  Neurological:     General: No focal deficit present.     Mental Status: He is alert and oriented to person, place, and time.  Psychiatric:        Behavior: Behavior normal.    Review of Systems  Constitutional:  Negative for fever.  Gastrointestinal:  Positive for constipation. Negative for diarrhea, nausea and vomiting.  Neurological:  Positive for dizziness and headaches.  Psychiatric/Behavioral:  Negative for hallucinations and suicidal ideas.    Blood pressure (!) 124/93, pulse (!) 103, temperature 97.6 F (36.4 C), temperature source Oral, resp. rate 16, height 5\' 10"  (1.778 m), weight 81.2 kg, SpO2 98%. Body mass index is 25.68 kg/m.   Treatment Plan Summary: Expand All Collapse All  [facility] MD Progress Note   11/16/2022 2:03 PM Rakeen Gaillard  MRN:  [id number] Subjective:  Yandriel was seen today in his room on rounds and again later in the common area. He is having a bad headache today, with some dizziness. Also having constipation. Milk of magnesia didn't help. He had a "little red pill" a few days ago that did help.  He still has suicidal ideation, passive and says "I always have that". He is not sleeping or eating very well today. No hallucinations. His mood is "like a swing thing" today. He has a lot of anxiety.    Principal Problem: MDD (major depressive disorder), recurrent severe, without psychosis (HCC) Diagnosis: Principal Problem:   MDD (major depressive disorder), recurrent severe, without psychosis (HCC) Active Problems:   Moderate cocaine use disorder (HCC)   Alcohol dependence (HCC)   Total Time spent with patient: 30 minutes   Past Psychiatric History: Prior Psychiatric diagnoses: none Past Psychiatric Hospitalizations: once 20 yrs ago after cut his wrist in suicide attempt   History of self mutilation: denies Past suicide attempts: x 2, 20 yrs ago cut his wrist, 1 and 1/2 yrs ago by OD on fentanyl Past history of HI, violent or aggressive behavior: denies   Past Psychiatric medications trials: none History of ECT/TMS: denies   Outpatient psychiatric Follow up: denies Prior Outpatient Therapy: denies   Past Medical History:      Past Medical History:  Diagnosis Date   Acid reflux     Emphysema of lung (HCC)     Head injury     Hepatitis C      S/p treatment with Epclusa in 2020   Hiatal hernia     HTN (hypertension)     Pulmonary nodules     Stroke [facility])               Past Surgical History:  Procedure Laterality Date   ACROMIO-CLAVICULAR JOINT REPAIR Right 06/28/2018    Procedure: ACROMIO-CLAVICULAR JOINT IRRIGATION AND DEBRIDEMENT;  Surgeon: Cammy Copa, MD;  Location: [facility] OR;  Service: Orthopedics;  Laterality: Right;   FACIAL FRACTURE SURGERY       IRRIGATION AND DEBRIDEMENT SHOULDER Right 06/28/2018    Procedure: IRRIGATION AND DEBRIDEMENT SHOULDER;  Surgeon: Cammy Copa, MD;  Location: [facility] OR;  Service: Orthopedics;  Laterality: Right;   KNEE ARTHROSCOPY Left 06/23/2018    Procedure: LEFT KNEE ARTHROSCOPY KNEE I&D.;  Surgeon: Cammy Copa, MD;   Location: [facility] OR;  Service: Orthopedics;  Laterality: Left;  Family History:       Family History  Problem Relation Age of Onset   Diabetes Mother     Hypertension Mother     Stroke Father     Hypertension Father     Colon cancer Neg Hx          Family Psychiatric  History: Psychiatric illness: denies Suicide: has a notable family history of suicide, with both his maternal grandfather and paternal uncle having taken their own lives.  Substance Abuse: multiple male family members with alcohol dependence Social History:  Social History        Substance and Sexual Activity  Alcohol Use Yes    Comment: 6 pack a week; but last drank 08/12/22.     Social History        Substance and Sexual Activity  Drug Use Yes   Types: Cocaine    Comment: states its "been a long time"     Social History         Socioeconomic History   Marital status: Single      Spouse name: Not on file   Number of children: Not on file   Years of education: Not on file   Highest education level: Not on file  Occupational History   Not on file  Tobacco Use   Smoking status: Some Days      Current packs/day: 2.00      Average packs/day: 2.0 packs/day for 30.0 years (60.0 ttl pk-yrs)      Types: Cigarettes   Smokeless tobacco: Never   Tobacco comments:      1 cig a day since the last visit.  Vaping Use   Vaping status: Never Used  Substance and Sexual Activity   Alcohol use: Yes      Comment: 6 pack a week; but last drank 08/12/22.   Drug use: Yes      Types: Cocaine      Comment: states its "been a long time"    Sexual activity: Not Currently  Other Topics Concern   Not on file  Social History Narrative   Not on file    Social Determinants of Health        Financial Resource Strain: Low Risk  (06/01/2022)    Overall Financial Resource Strain (CARDIA)     Difficulty of Paying Living Expenses: Not very hard  Food Insecurity: No Food Insecurity (11/11/2022)    Hunger Vital Sign      Worried About Running Out of Food in the Last Year: Never true     Ran Out of Food in the Last Year: Never true  Transportation Needs: No Transportation Needs (11/11/2022)    PRAPARE - Therapist, art (Medical): No     Lack of Transportation (Non-Medical): No  Physical Activity: Insufficiently Active (06/01/2022)    Exercise Vital Sign     Days of Exercise per Week: 7 days     Minutes of Exercise per Session: 10 min  Stress: No Stress Concern Present (06/01/2022)    [employer] of Occupational Health - Occupational Stress Questionnaire     Feeling of Stress : Only a little  Social Connections: Moderately Isolated (06/01/2022)    Social Connection and Isolation Panel [NHANES]     Frequency of Communication with Friends and Family: More than three times a week     Frequency of Social Gatherings with Friends and Family: More than three times a week  Attends Religious Services: Never     Active Member of Clubs or Organizations: No     Attends Banker Meetings: Never     Marital Status: Living with partner    Additional Social History:    Sleep: Poor   Appetite:  Poor   Current Medications:          Current Facility-Administered Medications  Medication Dose Route Frequency Provider Last Rate Last Admin   acetaminophen (TYLENOL) tablet 650 mg  650 mg Oral Q6H PRN Onuoha, Chinwendu V, NP   650 mg at 11/16/22 0615   alum & mag hydroxide-simeth (MAALOX/MYLANTA) 200-200-20 MG/5ML suspension 15 mL  15 mL Oral Q4H PRN Onuoha, Chinwendu V, NP   15 mL at 11/11/22 1154   amLODipine (NORVASC) tablet 10 mg  10 mg Oral Daily Attiah, Nadir, MD   10 mg at 11/16/22 0745   atorvastatin (LIPITOR) tablet 20 mg  20 mg Oral Daily Monserath Neff, Shelbie Hutching, MD       cephALEXin (KEFLEX) capsule 500 mg  500 mg Oral Q12H Attiah, Nadir, MD   500 mg at 11/16/22 0746   docusate sodium (COLACE) capsule 100 mg  100 mg Oral Daily Ramiah Helfrich, Shelbie Hutching, MD       FLUoxetine  (PROZAC) capsule 20 mg  20 mg Oral Daily Abbott Pao, Nadir, MD   20 mg at 11/16/22 0746   gabapentin (NEURONTIN) capsule 200 mg  200 mg Oral TID Sarita Bottom, MD   200 mg at 11/16/22 0746   hydrOXYzine (ATARAX) tablet 25 mg  25 mg Oral TID PRN Sarita Bottom, MD   25 mg at 11/15/22 1301   hydrOXYzine (ATARAX) tablet 50 mg  50 mg Oral BID Abbott Pao, Nadir, MD   50 mg at 11/16/22 0746   ibuprofen (ADVIL) tablet 800 mg  800 mg Oral Q8H PRN Abbott Pao, Nadir, MD   800 mg at 11/16/22 0940   LORazepam (ATIVAN) tablet 2 mg  2 mg Oral TID PRN Onuoha, Chinwendu V, NP        Or   LORazepam (ATIVAN) injection 2 mg  2 mg Intramuscular TID PRN Onuoha, Chinwendu V, NP       magnesium hydroxide (MILK OF MAGNESIA) suspension 15 mL  15 mL Oral Daily PRN Onuoha, Chinwendu V, NP   15 mL at 11/13/22 1636   multivitamin with minerals tablet 1 tablet  1 tablet Oral Daily Onuoha, Chinwendu V, NP   1 tablet at 11/16/22 0746   ondansetron (ZOFRAN-ODT) disintegrating tablet 4 mg  4 mg Oral Q6H PRN Abbott Pao, Nadir, MD   4 mg at 11/15/22 2000   pantoprazole (PROTONIX) EC tablet 40 mg  40 mg Oral Daily Attiah, Nadir, MD   40 mg at 11/16/22 0746   risperiDONE (RISPERDAL) tablet 1 mg  1 mg Oral QHS Attiah, Nadir, MD   1 mg at 11/15/22 2000   SUMAtriptan (IMITREX) tablet 25 mg  25 mg Oral Once Roselle Locus, MD       thiamine (Vitamin B-1) tablet 100 mg  100 mg Oral Daily Onuoha, Chinwendu V, NP   100 mg at 11/16/22 0745   traZODone (DESYREL) tablet 100 mg  100 mg Oral QHS PRN Sarita Bottom, MD   100 mg at 11/15/22 2000          Lab Results:  Lab Results Last 48 Hours        Results for orders placed or performed during the hospital encounter of 11/11/22 (from the past 48  hour(s))  Hemoglobin A1c     Status: None    Collection Time: 11/16/22  6:22 AM  Result Value Ref Range    Hgb A1c MFr Bld 5.5 4.8 - 5.6 %      Comment: (NOTE) Pre diabetes:          5.7%-6.4%   Diabetes:              >6.4%   Glycemic control for    <7.0% adults with diabetes      Mean Plasma Glucose 111.15 mg/dL      Comment: Performed at [facility], [address], [place], [place] [address]  Lipid panel     Status: Abnormal    Collection Time: 11/16/22  6:22 AM  Result Value Ref Range    Cholesterol 221 (H) 0 - 200 mg/dL    Triglycerides 604 (H) <150 mg/dL    HDL 49 >54 mg/dL    Total CHOL/HDL Ratio 4.5 RATIO    VLDL 61 (H) 0 - 40 mg/dL    LDL Cholesterol 098 (H) 0 - 99 mg/dL      Comment:        Total Cholesterol/HDL:CHD Risk Coronary Heart Disease Risk Table                     Men   Women  1/2 Average Risk   3.4   3.3  Average Risk       5.0   4.4  2 X Average Risk   9.6   7.1  3 X Average Risk  23.4   11.0        Use the calculated Patient Ratio above and the CHD Risk Table to determine the patient's CHD Risk.        ATP III CLASSIFICATION (LDL):  <100     mg/dL   Optimal  119-147  mg/dL   Near or Above                    Optimal  130-159  mg/dL   Borderline  829-562  mg/dL   High  >130     mg/dL   Very High Performed at [facility], [address], [place], [place] [address]          Blood Alcohol level:  Recent Labs       Lab Results  Component Value Date    ETH <10 11/10/2022    ETH <10 12/30/2021        Metabolic Disorder Labs: Recent Labs       Lab Results  Component Value Date    HGBA1C 5.5 11/16/2022    MPG 111.15 11/16/2022    MPG 111.15 10/22/2021      Recent Labs  No results found for: "PROLACTIN"   Recent Labs       Lab Results  Component Value Date    CHOL 221 (H) 11/16/2022    TRIG 303 (H) 11/16/2022    HDL 49 11/16/2022    CHOLHDL 4.5 11/16/2022    VLDL 61 (H) 11/16/2022    LDLCALC 111 (H) 11/16/2022    LDLCALC 97 10/22/2021        Physical Findings: AIMS: Facial and Oral Movements Muscles of Facial Expression: None Lips and Perioral Area: None Jaw: None Tongue: None,Extremity Movements Upper (arms, wrists, hands, fingers):  None Lower (legs, knees, ankles, toes): None, Trunk Movements Neck, shoulders, hips: None, Global Judgements Severity of abnormal  movements overall : None Incapacitation due to abnormal movements: None Patient's awareness of abnormal movements: No Awareness, Dental Status Current problems with teeth and/or dentures?: No Does patient usually wear dentures?: No  CIWA:  CIWA-Ar Total: 8 COWS:      Musculoskeletal: Strength & Muscle Tone: within normal limits Gait & Station: normal Patient leans: N/A   Psychiatric Specialty Exam:   Presentation  General Appearance:  Disheveled   Eye Contact: Fair   Speech: Normal Rate   Speech Volume: Decreased   Handedness: Right     Mood and Affect  Mood: Depressed   Affect: Depressed     Thought Process  Thought Processes: Goal Directed   Descriptions of Associations:Intact   Orientation:Full (Time, Place and Person)   Thought Content:Logical   History of Schizophrenia/Schizoaffective disorder:No data recorded Duration of Psychotic Symptoms:No data recorded Hallucinations:Hallucinations: None   Ideas of Reference:None   Suicidal Thoughts:Suicidal Thoughts: Yes, Passive SI Passive Intent and/or Plan: Without Plan   Homicidal Thoughts:Homicidal Thoughts: No     Sensorium  Memory: Immediate Fair; Recent Fair   Judgment: Fair   Insight: Fair     Chartered certified accountant: Fair   Attention Span: Fair   Recall: [employer] of Knowledge: Fair   Language: Fair     Psychomotor Activity  Psychomotor Activity: Psychomotor Activity: Decreased     Assets  Assets: Desire for Improvement     Sleep  Sleep: Sleep: Poor       Physical Exam: Physical Exam Vitals and nursing note reviewed.  HENT:     Head: Normocephalic.  Eyes:     Extraocular Movements: Extraocular movements intact.  Pulmonary:     Effort: Pulmonary effort is normal.  Musculoskeletal:        General: Normal  range of motion.     Cervical back: Normal range of motion.  Neurological:     General: No focal deficit present.     Mental Status: He is alert and oriented to person, place, and time.  Psychiatric:        Behavior: Behavior normal.      Review of Systems  Constitutional:  Negative for fever.  HENT:  Negative for congestion and sore throat.   Respiratory:  Negative for cough.   Cardiovascular:  Negative for chest pain.  Gastrointestinal:  Positive for constipation. Negative for abdominal pain, diarrhea and vomiting.  Musculoskeletal:  Negative for myalgias.  Neurological:  Positive for dizziness and headaches.  Psychiatric/Behavioral:  Positive for suicidal ideas.     Blood pressure (!) 126/93, pulse 96, temperature (!) 97.5 F (36.4 C), temperature source Oral, resp. rate 16, height 5\' 10"  (1.778 m), weight 81.2 kg, SpO2 99%. Body mass index is 25.68 kg/m.     Treatment Plan Summary: Daily contact with patient to assess and evaluate symptoms and progress in treatment and Medication management   ASSESSMENT:   Diagnoses / Active Problems: Principal Problem: MDD (major depressive disorder), recurrent severe, without psychosis (HCC) Diagnosis: Principal Problem:   MDD (major depressive disorder), recurrent severe, without psychosis (HCC) Active Problems:   Moderate cocaine use disorder (HCC)   Alcohol dependence (HCC)     PLAN: Safety and Monitoring:             -- Voluntary admission to inpatient psychiatric unit for safety, stabilization and treatment             -- Daily contact with patient to assess and evaluate symptoms and progress in treatment             --  Patient's case to be discussed in multi-disciplinary team meeting             -- Observation Level : q15 minute checks             -- Vital signs:  q12 hours             -- Precautions: suicide, elopement, and assault   Medications:              Continue Prozac 20 mg daily for depression and long-term  treatment of anxiety             continue risperidone 1 mg at bedtime to help with mood and psychosis and monitor effects and safety and titrate dosing accordingly             Continue gabapentin 200 mg 3 times daily for alcohol dependence and craving as well as chronic pain, also will help with mood and anxiety             Stop trazodone, trial of remeron. Trazodone may cause headaches. Also added melatonin             Trial of one time dose of imitrex 25mg  for headache on 10/8 was not helpful              One-time dose of dulcolax suppository 10mg  on 11/17/22             Started colace 100mg  PO daily for constipation             continue Vistaril 50 mg twice daily scheduled for anxiety at 8 AM and 4 PM.   continue Vistaril 25 mg 3 times daily as needed for anxiety          Continue Ativan taper for alcohol withdrawals and continue to monitor CIWA score             CIWA protocol for alcohol withdrawals and follow-up            Continue pantoprazole 40 mg daily, home medication for GERD           Continue Norvasc 10 mg daily for hypertension, home medication             Continue Keflex 500 mg twice daily for 7 days for finger infection, will monitor -- The risks/benefits/side-effects/alternatives to this medication were discussed in detail with the patient and time was given for questions. The patient consents to medication trial.                            3. Labs Reviewed: lipid panel. Started lipitor 20mg  PO QHS      Lab ordered: n/a   4. Tobacco Use Disorder             -- Patient quit smoking 2 weeks ago, declines NicoDerm patch at this time, will follow             -- Smoking cessation encouraged   5. Group and Therapy: -- Encouraged patient to participate in unit milieu and in scheduled group therapies              --Substance Use counseling: Patient was counseled regarding need to abstain completely from alcohol marijuana and cocaine after discharge.   Patient is interested to go  to inpatient residential rehab for substance use rehab after discharge if that is not successful he is in agreement with intensive outpatient rehab treatment, will  follow.   6. Discharge Planning:              -- Social work and case management to assist with discharge planning and identification of hospital follow-up needs prior to discharge             -- Estimated LOS: 5-7 days             -- Discharge Concerns: Need to establish a safety plan; Medication compliance and effectiveness             -- Discharge Goals: Return home with outpatient referrals for mental health follow-up including medication management/psychotherapy     The patient is agreeable with the medication plan, as above. We will monitor the patient's response to pharmacologic treatment, and adjust medications as necessary. Patient is encouraged to participate in group therapy while admitted to the psychiatric unit. We will address other chronic and acute stressors, which contributed to the patient's worsening anxiety and depression, SI, in order to reduce the risk of self-harm at discharge.     Physician Treatment Plan for Primary Diagnosis: MDD (major depressive disorder), recurrent severe, without psychosis (HCC) Long Term Goal(s): Improvement in symptoms so as ready for discharge   Short Term Goals: Ability to identify changes in lifestyle to reduce recurrence of condition will improve, Ability to verbalize feelings will improve, Ability to disclose and discuss suicidal ideas, Ability to demonstrate self-control will improve, and Ability to identify and develop effective coping behaviors will improve     I certify that inpatient services furnished can reasonably be expected to improve the patient's condition.      Roselle Locus, MD 11/17/2022, 1:47 PM

## 2022-11-17 NOTE — BH IP Treatment Plan (Signed)
Interdisciplinary Treatment and Diagnostic Plan Update  11/17/2022 Time of Session: 9:25 [name] MRN: [id number]  Principal Diagnosis: MDD (major depressive disorder), recurrent severe, without psychosis (HCC)  Secondary Diagnoses: Principal Problem:   MDD (major depressive disorder), recurrent severe, without psychosis (HCC) Active Problems:   Moderate cocaine use disorder (HCC)   Alcohol dependence (HCC)   Current Medications:  Current Facility-Administered Medications  Medication Dose Route Frequency Provider Last Rate Last Admin   acetaminophen (TYLENOL) tablet 650 mg  650 mg Oral Q6H PRN Onuoha, Chinwendu V, NP   650 mg at 11/17/22 0443   alum & mag hydroxide-simeth (MAALOX/MYLANTA) 200-200-20 MG/5ML suspension 15 mL  15 mL Oral Q4H PRN Onuoha, Chinwendu V, NP   15 mL at 11/11/22 1154   amLODipine (NORVASC) tablet 10 mg  10 mg Oral Daily Attiah, Nadir, MD   10 mg at 11/17/22 0751   atorvastatin (LIPITOR) tablet 20 mg  20 mg Oral Daily Hill, Shelbie Hutching, MD   20 mg at 11/17/22 0752   cephALEXin (KEFLEX) capsule 500 mg  500 mg Oral Q12H Attiah, Nadir, MD   500 mg at 11/17/22 0752   docusate sodium (COLACE) capsule 100 mg  100 mg Oral Daily Roselle Locus, MD   100 mg at 11/17/22 0752   FLUoxetine (PROZAC) capsule 20 mg  20 mg Oral Daily Abbott Pao, Nadir, MD   20 mg at 11/17/22 0752   gabapentin (NEURONTIN) capsule 200 mg  200 mg Oral TID Sarita Bottom, MD   200 mg at 11/17/22 2725   hydrOXYzine (ATARAX) tablet 25 mg  25 mg Oral TID PRN Sarita Bottom, MD   25 mg at 11/17/22 0445   hydrOXYzine (ATARAX) tablet 50 mg  50 mg Oral BID Abbott Pao, Nadir, MD   50 mg at 11/17/22 3664   ibuprofen (ADVIL) tablet 800 mg  800 mg Oral Q8H PRN Abbott Pao, Nadir, MD   800 mg at 11/16/22 2042   LORazepam (ATIVAN) tablet 2 mg  2 mg Oral TID PRN Onuoha, Chinwendu V, NP       Or   LORazepam (ATIVAN) injection 2 mg  2 mg Intramuscular TID PRN Onuoha, Chinwendu V, NP       magnesium hydroxide (MILK  OF MAGNESIA) suspension 15 mL  15 mL Oral Daily PRN Onuoha, Chinwendu V, NP   15 mL at 11/16/22 1821   melatonin tablet 5 mg  5 mg Oral QHS Hill, Shelbie Hutching, MD   5 mg at 11/16/22 2040   mirtazapine (REMERON) tablet 7.5 mg  7.5 mg Oral QHS PRN Roselle Locus, MD       multivitamin with minerals tablet 1 tablet  1 tablet Oral Daily Onuoha, Chinwendu V, NP   1 tablet at 11/17/22 0753   ondansetron (ZOFRAN-ODT) disintegrating tablet 4 mg  4 mg Oral Q6H PRN Abbott Pao, Nadir, MD   4 mg at 11/15/22 2000   pantoprazole (PROTONIX) EC tablet 40 mg  40 mg Oral Daily Attiah, Nadir, MD   40 mg at 11/17/22 0752   risperiDONE (RISPERDAL) tablet 1 mg  1 mg Oral QHS Attiah, Nadir, MD   1 mg at 11/16/22 2041   thiamine (Vitamin B-1) tablet 100 mg  100 mg Oral Daily Onuoha, Chinwendu V, NP   100 mg at 11/17/22 4034   PTA Medications: Medications Prior to Admission  Medication Sig Dispense Refill Last Dose   acetaminophen (TYLENOL) 325 MG tablet Take 650 mg by mouth every 6 (six) hours as needed for moderate  pain.      amLODipine (NORVASC) 10 MG tablet Take 1 tablet (10 mg total) by mouth daily. 90 tablet 1    azithromycin (ZITHROMAX) 250 MG tablet Take 250 mg by mouth daily.      cefdinir (OMNICEF) 300 MG capsule Take 300 mg by mouth 2 (two) times daily.      Cyanocobalamin (VITAMIN B 12 PO) Take 1 tablet by mouth daily.      ipratropium-albuterol (DUONEB) 0.5-2.5 (3) MG/3ML SOLN Take 3 mLs by nebulization every 4 (four) hours as needed (wheezing, shortness of breath).      olmesartan (BENICAR) 20 MG tablet Take 1 tablet (20 mg total) by mouth daily. 90 tablet 1    pantoprazole (PROTONIX) 40 MG tablet Take 1 tablet (40 mg total) by mouth daily at 12 noon. 90 tablet 1    predniSONE (STERAPRED UNI-PAK 21 TAB) 5 MG (21) TBPK tablet Take 5 mg by mouth as directed.      VENTOLIN HFA 108 (90 Base) MCG/ACT inhaler Inhale 2 puffs into the lungs every 6 (six) hours as needed for wheezing or shortness of breath.        Patient Stressors: Substance abuse   Traumatic event    Patient Strengths: Supportive family/friends   Treatment Modalities: Medication Management, Group therapy, Case management,  1 to 1 session with clinician, Psychoeducation, Recreational therapy.   Physician Treatment Plan for Primary Diagnosis: MDD (major depressive disorder), recurrent severe, without psychosis (HCC) Long Term Goal(s): Improvement in symptoms so as ready for discharge   Short Term Goals: Ability to identify changes in lifestyle to reduce recurrence of condition will improve Ability to verbalize feelings will improve Ability to disclose and discuss suicidal ideas Ability to demonstrate self-control will improve Ability to identify and develop effective coping behaviors will improve  Medication Management: Evaluate patient's response, side effects, and tolerance of medication regimen.  Therapeutic Interventions: 1 to 1 sessions, Unit Group sessions and Medication administration.  Evaluation of Outcomes: Progressing  Physician Treatment Plan for Secondary Diagnosis: Principal Problem:   MDD (major depressive disorder), recurrent severe, without psychosis (HCC) Active Problems:   Moderate cocaine use disorder (HCC)   Alcohol dependence (HCC)  Long Term Goal(s): Improvement in symptoms so as ready for discharge   Short Term Goals: Ability to identify changes in lifestyle to reduce recurrence of condition will improve Ability to verbalize feelings will improve Ability to disclose and discuss suicidal ideas Ability to demonstrate self-control will improve Ability to identify and develop effective coping behaviors will improve     Medication Management: Evaluate patient's response, side effects, and tolerance of medication regimen.  Therapeutic Interventions: 1 to 1 sessions, Unit Group sessions and Medication administration.  Evaluation of Outcomes: Progressing   RN Treatment Plan for Primary  Diagnosis: MDD (major depressive disorder), recurrent severe, without psychosis (HCC) Long Term Goal(s): Knowledge of disease and therapeutic regimen to maintain health will improve  Short Term Goals: Ability to remain free from injury will improve, Ability to verbalize frustration and anger appropriately will improve, Ability to demonstrate self-control, Ability to participate in decision making will improve, Ability to verbalize feelings will improve, Ability to disclose and discuss suicidal ideas, Ability to identify and develop effective coping behaviors will improve, and Compliance with prescribed medications will improve  Medication Management: RN will administer medications as ordered by provider, will assess and evaluate patient's response and provide education to patient for prescribed medication. RN will report any adverse and/or side effects to prescribing provider.  Therapeutic Interventions: 1 on 1 counseling sessions, Psychoeducation, Medication administration, Evaluate responses to treatment, Monitor vital signs and CBGs as ordered, Perform/monitor CIWA, COWS, AIMS and Fall Risk screenings as ordered, Perform wound care treatments as ordered.  Evaluation of Outcomes: Progressing   LCSW Treatment Plan for Primary Diagnosis: MDD (major depressive disorder), recurrent severe, without psychosis (HCC) Long Term Goal(s): Safe transition to appropriate next level of care at discharge, Engage patient in therapeutic group addressing interpersonal concerns.  Short Term Goals: Engage patient in aftercare planning with referrals and resources, Increase social support, Increase ability to appropriately verbalize feelings, Increase emotional regulation, Facilitate acceptance of mental health diagnosis and concerns, Facilitate patient progression through stages of change regarding substance use diagnoses and concerns, Identify triggers associated with mental health/substance abuse issues, and Increase  skills for wellness and recovery  Therapeutic Interventions: Assess for all discharge needs, 1 to 1 time with Social worker, Explore available resources and support systems, Assess for adequacy in community support network, Educate family and significant other(s) on suicide prevention, Complete Psychosocial Assessment, Interpersonal group therapy.  Evaluation of Outcomes: Progressing   Progress in Treatment: Attending groups: Yes. Participating in groups: Yes. Taking medication as prescribed: Yes. Toleration medication: Yes. Family/Significant other contact made: Yes, individual(s) contacted:  Herby Abraham  Patient understands diagnosis: Yes. Discussing patient identified problems/goals with staff: Yes. Medical problems stabilized or resolved: Yes. Denies suicidal/homicidal ideation: Yes. Issues/concerns per patient self-inventory: Yes. Other: N/A  New problem(s) identified: No, Describe:  None Reported  New Short Term/Long Term Goal(s): medication stabilization, elimination of SI thoughts, development of comprehensive mental wellness plan.   Patient Goals:  Coping Skills  Discharge Plan or Barriers: . CSW will continue to follow and assess for appropriate referrals and possible discharge planning.   Reason for Continuation of Hospitalization: Anxiety Depression Medical Issues Medication stabilization Suicidal ideation Withdrawal symptoms  Estimated Length of Stay: 5-7 Days  Last 3 [place] Suicide Severity Risk Score: Flowsheet Row [facility] (Current) [facility]/10/2022 [facility] ED from [facility]/29/2022 in [facility]  C-SSRS RISK CATEGORY High Risk High Risk No Risk       Last [facility] 2/9 Scores:    11/10/2022    2:17 PM 08/18/2022    8:25 AM 08/16/2022    2:36 PM  Depression screen PHQ 2/9  Decreased Interest 3 0 0   Down, Depressed, Hopeless 3 1 0  PHQ - 2 Score 6 1 0  Altered sleeping  1   Tired, decreased energy  1   Change in appetite  0   Feeling bad or failure about yourself   0   Trouble concentrating  0   Moving slowly or fidgety/restless  0   Suicidal thoughts  0   PHQ-9 Score  3   Difficult doing work/chores  Somewhat difficult   detox, medication management for mood stabilization; elimination of SI thoughts; development of comprehensive mental wellness/sobriety plan     Scribe for Treatment Team: Ane Payment, LCSW 11/17/2022 9:22 AM

## 2022-11-18 ENCOUNTER — Inpatient Hospital Stay (HOSPITAL_COMMUNITY)
Admit: 2022-11-18 | Discharge: 2022-11-18 | Disposition: A | Discharge status: Home / Self Care | Payer: Medicaid Other | Attending: Family Medicine | Admitting: Family Medicine

## 2022-11-18 DIAGNOSIS — K5900 Constipation, unspecified: Secondary | ICD-10-CM | POA: Diagnosis present

## 2022-11-18 DIAGNOSIS — F332 Major depressive disorder, recurrent severe without psychotic features: Secondary | ICD-10-CM | POA: Diagnosis not present

## 2022-11-18 DIAGNOSIS — I878 Other specified disorders of veins: Secondary | ICD-10-CM | POA: Diagnosis not present

## 2022-11-18 MED ORDER — LINACLOTIDE 145 MCG PO CAPS
145.0000 ug | ORAL_CAPSULE | Freq: Every day | ORAL | Status: DC
  Administered 2022-11-19 – 2022-11-21 (×3): 145 ug via ORAL
  Filled 2022-11-18 (×6): qty 1

## 2022-11-18 MED ORDER — GABAPENTIN 100 MG PO CAPS
100.0000 mg | ORAL_CAPSULE | Freq: Three times a day (TID) | ORAL | Status: DC | PRN
  Administered 2022-11-18 – 2022-11-19 (×2): 100 mg via ORAL
  Filled 2022-11-18 (×2): qty 1

## 2022-11-18 MED ORDER — LINACLOTIDE 145 MCG PO CAPS
145.0000 ug | ORAL_CAPSULE | Freq: Once | ORAL | Status: AC
  Administered 2022-11-18: 145 ug via ORAL
  Filled 2022-11-18 (×2): qty 1

## 2022-11-18 NOTE — Group Note (Signed)
Date:  11/18/2022 Time:  5:48 PM  Group Topic/Focus:  Wellness Toolbox:   The focus of this group is to discuss various aspects of wellness, balancing those aspects and exploring ways to increase the ability to experience wellness.  Patients will create a wellness toolbox for use upon discharge.    Participation Level:  Active  Participation Quality:  Appropriate  Affect:  Appropriate  Cognitive:  Appropriate  Insight: Appropriate  Engagement in Group:  Engaged  Modes of Intervention:  Exploration  Additional Comments:     Reymundo Poll 11/18/2022, 5:48 PM

## 2022-11-18 NOTE — Group Note (Signed)
Date:  11/18/2022 Time:  10:51 AM  Group Topic/Focus:  Goals Group:   The focus of this group is to help patients establish daily goals to achieve during treatment and discuss how the patient can incorporate goal setting into their daily lives to aide in recovery.    Participation Level:  Active  Participation Quality:  Appropriate  Affect:  Appropriate  Cognitive:  Appropriate  Insight: Appropriate  Engagement in Group:  Engaged  Modes of Intervention:  Discussion  Additional Comments:     Reymundo Poll 11/18/2022, 10:51 AM

## 2022-11-18 NOTE — Progress Notes (Signed)
   11/17/22 2216  Psych Admission Type (Psych Patients Only)  Admission Status Voluntary  Psychosocial Assessment  Patient Complaints Anxiety  Eye Contact Fair  Facial Expression Anxious  Affect Appropriate to circumstance  Speech Logical/coherent  Interaction Assertive  Motor Activity Other (Comment) (WDL)  Appearance/Hygiene Unremarkable  Behavior Characteristics Cooperative;Appropriate to situation  Mood Anxious;Preoccupied  Thought Process  Coherency WDL  Content WDL  Delusions WDL  Perception WDL  Hallucination None reported or observed  Judgment Impaired  Confusion None  Danger to Self  Current suicidal ideation? Denies  Self-Injurious Behavior No self-injurious ideation or behavior indicators observed or expressed   Agreement Not to Harm Self Yes  Description of Agreement verbal  Danger to Others  Danger to Others None reported or observed

## 2022-11-18 NOTE — Group Note (Signed)
[facility] LCSW Group Therapy Note   Group Date: 11/18/2022 Start Time: 1105 End Time: 1155  Type of Therapy/Topic:  Group Therapy:  Feelings about Diagnosis  Participation Level:  Did Not Attend     Description of Group:    This group will allow patients to explore their thoughts and feelings about diagnoses they have received. Patients will be guided to explore their level of understanding and acceptance of these diagnoses. Facilitator will encourage patients to process their thoughts and feelings about the reactions of others to their diagnosis, and will guide patients in identifying ways to discuss their diagnosis with significant others in their lives. This group will be process-oriented, with patients participating in exploration of their own experiences as well as giving and receiving support and challenge from other group members.   Therapeutic Goals: 1. Patient will demonstrate understanding of diagnosis as evidence by identifying two or more symptoms of the disorder:  2. Patient will be able to express two feelings regarding the diagnosis 3. Patient will demonstrate ability to communicate their needs through discussion and/or role plays          Therapeutic Modalities:   Cognitive Behavioral Therapy Brief Therapy Feelings Identification    Eldana Isip S Gavino Fouch, LCSW

## 2022-11-18 NOTE — Evaluation (Signed)
OT screen complete.   Pt is operating at his normal baseline. He does present w/ some residual R side weakness d/t a CVA 1 year+. Pt performs UB and LB dressing w/ independence and extra time. OT instructed pt engage in these ADLs in a seated position for improved safety. Ambulation and transfers are both independent as well. OT did instruct pt to scoot and position to EOB and use extra time for improved sit/sit transfers.   OT will sign off as there are no further OT needs at this time. Please re-consult as needed.   Thank you for this consult.   Kerrin Champagne, OT

## 2022-11-18 NOTE — Progress Notes (Signed)
[facility] MD Progress Note  11/18/2022 2:45 PM [name]  MRN:  [id number] Subjective:  [name] was seen this morning and again in the afternoon. He is tearful today, discussed how he has no family left after both parents have died and all 9 of his siblings. He is having bloating, fullness and abdominal discomfort. He is not able to eat now. He is not having nausea or vomiting. He says that during a hospital stay for septic arthritis he had a serious case of constipation that almost went to surgery. He worries that it might be due to his hernia that he was supposed to have fixed but didn't. He had some hallucinations this morning. He has been ruminating on death and dying.   Principal Problem: MDD (major depressive disorder), recurrent severe, without psychosis (HCC) Diagnosis: Principal Problem:   MDD (major depressive disorder), recurrent severe, without psychosis (HCC) Active Problems:   Moderate cocaine use disorder (HCC)   Alcohol dependence (HCC)   Constipation  Total Time spent with patient: 30 minutes  Past Psychiatric History: Prior Psychiatric diagnoses: none Past Psychiatric Hospitalizations: once 20 yrs ago after cut his wrist in suicide attempt   History of self mutilation: denies Past suicide attempts: x 2, 20 yrs ago cut his wrist, 1 and 1/2 yrs ago by OD on fentanyl Past history of HI, violent or aggressive behavior: denies   Past Psychiatric medications trials: none History of ECT/TMS: denies   Outpatient psychiatric Follow up: denies Prior Outpatient Therapy: denies  Past Medical History:  Past Medical History:  Diagnosis Date   Acid reflux    Emphysema of lung (HCC)    Head injury    Hepatitis C    S/p treatment with Epclusa in 2020   Hiatal hernia    HTN (hypertension)    Pulmonary nodules    Stroke [facility])     Past Surgical History:  Procedure Laterality Date   ACROMIO-CLAVICULAR JOINT REPAIR Right 06/28/2018   Procedure: ACROMIO-CLAVICULAR JOINT IRRIGATION  AND DEBRIDEMENT;  Surgeon: Cammy Copa, MD;  Location: [facility] OR;  Service: Orthopedics;  Laterality: Right;   FACIAL FRACTURE SURGERY     IRRIGATION AND DEBRIDEMENT SHOULDER Right 06/28/2018   Procedure: IRRIGATION AND DEBRIDEMENT SHOULDER;  Surgeon: Cammy Copa, MD;  Location: [facility] OR;  Service: Orthopedics;  Laterality: Right;   KNEE ARTHROSCOPY Left 06/23/2018   Procedure: LEFT KNEE ARTHROSCOPY KNEE I&D.;  Surgeon: Cammy Copa, MD;  Location: [facility] OR;  Service: Orthopedics;  Laterality: Left;   Family History:  Family History  Problem Relation Age of Onset   Diabetes Mother    Hypertension Mother    Stroke Father    Hypertension Father    Colon cancer Neg Hx    Family Psychiatric  History:  Psychiatric illness: denies Suicide: has a notable family history of suicide, with both his maternal grandfather and paternal uncle having taken their own lives.  Substance Abuse: multiple male family members with alcohol dependence Social History:  Social History   Substance and Sexual Activity  Alcohol Use Yes   Comment: 6 pack a week; but last drank 08/12/22.     Social History   Substance and Sexual Activity  Drug Use Yes   Types: Cocaine   Comment: states its "been a long time"     Social History   Socioeconomic History   Marital status: Single    Spouse name: Not on file   Number of children: Not on file   Years of education:  Not on file   Highest education level: Not on file  Occupational History   Not on file  Tobacco Use   Smoking status: Some Days    Current packs/day: 2.00    Average packs/day: 2.0 packs/day for 30.0 years (60.0 ttl pk-yrs)    Types: Cigarettes   Smokeless tobacco: Never   Tobacco comments:    1 cig a day since the last visit.  Vaping Use   Vaping status: Never Used  Substance and Sexual Activity   Alcohol use: Yes    Comment: 6 pack a week; but last drank 08/12/22.   Drug use: Yes    Types: Cocaine    Comment: states its "been a  long time"    Sexual activity: Not Currently  Other Topics Concern   Not on file  Social History Narrative   Not on file   Social Determinants of Health   Financial Resource Strain: Low Risk  (06/01/2022)   Overall Financial Resource Strain (CARDIA)    Difficulty of Paying Living Expenses: Not very hard  Food Insecurity: No Food Insecurity (11/11/2022)   Hunger Vital Sign    Worried About Running Out of Food in the Last Year: Never true    Ran Out of Food in the Last Year: Never true  Transportation Needs: No Transportation Needs (11/11/2022)   PRAPARE - Administrator, Civil Service (Medical): No    Lack of Transportation (Non-Medical): No  Physical Activity: Insufficiently Active (06/01/2022)   Exercise Vital Sign    Days of Exercise per Week: 7 days    Minutes of Exercise per Session: 10 min  Stress: No Stress Concern Present (06/01/2022)   [employer] of Occupational Health - Occupational Stress Questionnaire    Feeling of Stress : Only a little  Social Connections: Moderately Isolated (06/01/2022)   Social Connection and Isolation Panel [NHANES]    Frequency of Communication with Friends and Family: More than three times a week    Frequency of Social Gatherings with Friends and Family: More than three times a week    Attends Religious Services: Never    Database administrator or Organizations: No    Attends Engineer, structural: Never    Marital Status: Living with partner   Additional Social History:    Living situation: Was living with his girlfriend in [place] but will not be going back there "my daughter is trying to find me a place" Social support: His oldest daughter [name] is supportive, she lives in [place] Marital Status: Was married once for about 20 years and got divorced 22 years ago Children: 6 children 2 sons and 4 daughters ages 71 years old to 16 years old, he only has relationship with his oldest daughter [name] who is  supportive Education: 10th grade Employment: Currently on disability for the past year for medical reasons Military service: Denies Legal history: Has been to jail and the present several times last time to jail 3 years ago last time to prison 20 years ago for "running guns" has court on October 9 for DUI Trauma: Denies Access to guns: Has guns at girlfriend's house for hunting but he does not plan to go back there      Sleep: Poor  Appetite:  Poor  Current Medications: Current Facility-Administered Medications  Medication Dose Route Frequency Provider Last Rate Last Admin   acetaminophen (TYLENOL) tablet 650 mg  650 mg Oral Q6H PRN Onuoha, Chinwendu V, NP   650 mg  at 11/17/22 0443   alum & mag hydroxide-simeth (MAALOX/MYLANTA) 200-200-20 MG/5ML suspension 15 mL  15 mL Oral Q4H PRN Onuoha, Chinwendu V, NP   15 mL at 11/11/22 1154   amLODipine (NORVASC) tablet 10 mg  10 mg Oral Daily Attiah, Nadir, MD   10 mg at 11/18/22 0815   atorvastatin (LIPITOR) tablet 20 mg  20 mg Oral Daily Mike Hamre, Shelbie Hutching, MD   20 mg at 11/18/22 0815   cephALEXin (KEFLEX) capsule 500 mg  500 mg Oral Q12H Attiah, Nadir, MD   500 mg at 11/18/22 0815   docusate sodium (COLACE) capsule 100 mg  100 mg Oral Daily Roselle Locus, MD   100 mg at 11/18/22 0815   FLUoxetine (PROZAC) capsule 20 mg  20 mg Oral Daily Abbott Pao, Nadir, MD   20 mg at 11/18/22 0815   gabapentin (NEURONTIN) capsule 100 mg  100 mg Oral TID PRN Roselle Locus, MD       gabapentin (NEURONTIN) capsule 200 mg  200 mg Oral TID Abbott Pao, Nadir, MD   200 mg at 11/18/22 1402   ibuprofen (ADVIL) tablet 800 mg  800 mg Oral Q8H PRN Abbott Pao, Nadir, MD   800 mg at 11/18/22 1402   LORazepam (ATIVAN) tablet 2 mg  2 mg Oral TID PRN Onuoha, Chinwendu V, NP       Or   LORazepam (ATIVAN) injection 2 mg  2 mg Intramuscular TID PRN Onuoha, Chinwendu V, NP       magnesium hydroxide (MILK OF MAGNESIA) suspension 15 mL  15 mL Oral Daily PRN Onuoha,  Chinwendu V, NP   15 mL at 11/16/22 1821   melatonin tablet 5 mg  5 mg Oral QHS Rishan Oyama, Shelbie Hutching, MD   5 mg at 11/17/22 2117   mirtazapine (REMERON) tablet 7.5 mg  7.5 mg Oral QHS PRN Roselle Locus, MD       multivitamin with minerals tablet 1 tablet  1 tablet Oral Daily Onuoha, Chinwendu V, NP   1 tablet at 11/18/22 0814   pantoprazole (PROTONIX) EC tablet 40 mg  40 mg Oral Daily Attiah, Nadir, MD   40 mg at 11/18/22 0815   risperiDONE (RISPERDAL) tablet 1 mg  1 mg Oral QHS Attiah, Nadir, MD   1 mg at 11/17/22 2116   thiamine (Vitamin B-1) tablet 100 mg  100 mg Oral Daily Onuoha, Chinwendu V, NP   100 mg at 11/18/22 0815    Lab Results: No results found for this or any previous visit (from the past 48 hour(s)).  Blood Alcohol level:  Lab Results  Component Value Date   ETH <10 11/10/2022   ETH <10 12/30/2021    Metabolic Disorder Labs: Lab Results  Component Value Date   HGBA1C 5.5 11/16/2022   MPG 111.15 11/16/2022   MPG 111.15 10/22/2021   No results found for: "PROLACTIN" Lab Results  Component Value Date   CHOL 221 (H) 11/16/2022   TRIG 303 (H) 11/16/2022   HDL 49 11/16/2022   CHOLHDL 4.5 11/16/2022   VLDL 61 (H) 11/16/2022   LDLCALC 111 (H) 11/16/2022   LDLCALC 97 10/22/2021    Physical Findings: AIMS: Facial and Oral Movements Muscles of Facial Expression: None Lips and Perioral Area: None Jaw: None Tongue: None,Extremity Movements Upper (arms, wrists, hands, fingers): None Lower (legs, knees, ankles, toes): None, Trunk Movements Neck, shoulders, hips: None, Global Judgements Severity of abnormal movements overall : None Incapacitation due to abnormal movements: None Patient's awareness of  abnormal movements: No Awareness, Dental Status Current problems with teeth and/or dentures?: No Does patient usually wear dentures?: No  CIWA:  CIWA-Ar Total: 0 COWS:     Musculoskeletal: Strength & Muscle Tone: within normal limits Gait & Station:  normal Patient leans: N/A  Psychiatric Specialty Exam:  Presentation  General Appearance:  Casual  Eye Contact: Fleeting  Speech: Normal Rate  Speech Volume: Normal  Handedness: Right   Mood and Affect  Mood: Anxious; Depressed  Affect: Tearful   Thought Process  Thought Processes: Linear  Descriptions of Associations:Intact  Orientation:Full (Time, Place and Person)  Thought Content:Rumination  History of Schizophrenia/Schizoaffective disorder:No data recorded Duration of Psychotic Symptoms:No data recorded Hallucinations:Hallucinations: Auditory Description of Auditory Hallucinations: voice of mom this morning  Ideas of Reference:None  Suicidal Thoughts:Suicidal Thoughts: Yes, Passive SI Passive Intent and/or Plan: Without Intent; Without Plan  Homicidal Thoughts:Homicidal Thoughts: No   Sensorium  Memory: Immediate Fair; Recent Fair  Judgment: Fair  Insight: Shallow   Executive Functions  Concentration: Fair  Attention Span: Fair  Recall: [employer] of Knowledge: Fair  Language: Fair   Psychomotor Activity  Psychomotor Activity: Psychomotor Activity: Normal   Assets  Assets: Communication Skills   Sleep  Sleep: Sleep: Poor    Physical Exam: Physical Exam Vitals and nursing note reviewed.  Constitutional:      Appearance: Normal appearance.  HENT:     Head: Normocephalic and atraumatic.  Eyes:     Extraocular Movements: Extraocular movements intact.  Pulmonary:     Effort: Pulmonary effort is normal.  Abdominal:     General: There is distension.  Musculoskeletal:        General: Normal range of motion.     Cervical back: Normal range of motion.  Neurological:     General: No focal deficit present.     Mental Status: He is alert and oriented to person, place, and time.  Psychiatric:        Mood and Affect: Mood is depressed.        Behavior: Behavior is cooperative.    ROS Blood pressure 120/89,  pulse 100, temperature (!) 97.5 F (36.4 C), temperature source Oral, resp. rate 16, height 5\' 10"  (1.778 m), weight 81.2 kg, SpO2 100%. Body mass index is 25.68 kg/m.   Treatment Plan Summary: Daily contact with patient to assess and evaluate symptoms and progress in treatment and Medication management   ASSESSMENT:   Diagnoses / Active Problems: Principal Problem: MDD (major depressive disorder), recurrent severe, without psychosis (HCC) Diagnosis: Principal Problem:   MDD (major depressive disorder), recurrent severe, without psychosis (HCC) Active Problems:   Moderate cocaine use disorder (HCC)   Alcohol dependence (HCC) constipation     PLAN: Safety and Monitoring:             -- Voluntary admission to inpatient psychiatric unit for safety, stabilization and treatment             -- Daily contact with patient to assess and evaluate symptoms and progress in treatment             -- Patient's case to be discussed in multi-disciplinary team meeting             -- Observation Level : q15 minute checks             -- Vital signs:  q12 hours             -- Precautions: suicide, elopement, and assault  Medications:              Continue Prozac 20 mg daily for depression and long-term treatment of anxiety             continue risperidone 1 mg at bedtime to help with mood and psychosis and monitor effects and safety and titrate dosing accordingly             Continue gabapentin 200 mg 3 times daily for alcohol dependence and craving as well as chronic pain, also will help with mood and anxiety             Stopped trazodone, trial of remeron. Trazodone may cause headaches. Also added melatonin             Trial of one time dose of imitrex 25mg  for headache on 10/8 was not helpful              One-time dose of dulcolax suppository 10mg  on 11/17/22, no result   -read for the KUB on 11/18/22 is pending - need to rule out obstruction             continue colace 100mg  PO daily for  constipation             Stopped vistaril due to significant constipations. Will try using gabapentin 100mg  PO TID PRN anxiety          Continue Ativan taper for alcohol withdrawals and continue to monitor CIWA score             CIWA protocol for alcohol withdrawals and follow-up            Continue pantoprazole 40 mg daily, home medication for GERD           Continue Norvasc 10 mg daily for hypertension, home medication             Continue Keflex 500 mg twice daily for 7 days for finger infection, will monitor -- The risks/benefits/side-effects/alternatives to this medication were discussed in detail with the patient and time was given for questions. The patient consents to medication trial.                            3. Labs Reviewed: lipid panel. Started lipitor 20mg  PO QHS      Lab ordered: n/a   4. Tobacco Use Disorder             -- Patient quit smoking 2 weeks ago, declines NicoDerm patch at this time, will follow             -- Smoking cessation encouraged   5. Group and Therapy: -- Encouraged patient to participate in unit milieu and in scheduled group therapies              --Substance Use counseling: Patient was counseled regarding need to abstain completely from alcohol marijuana and cocaine after discharge.   Patient is interested to go to inpatient residential rehab for substance use rehab after discharge if that is not successful he is in agreement with intensive outpatient rehab treatment, will follow.   6. Discharge Planning:              -- Social work and case management to assist with discharge planning and identification of hospital follow-up needs prior to discharge             -- Estimated LOS: 5-7 days             --  Discharge Concerns: Need to establish a safety plan; Medication compliance and effectiveness             -- Discharge Goals: Return home with outpatient referrals for mental health follow-up including medication management/psychotherapy     The  patient is agreeable with the medication plan, as above. We will monitor the patient's response to pharmacologic treatment, and adjust medications as necessary. Patient is encouraged to participate in group therapy while admitted to the psychiatric unit. We will address other chronic and acute stressors, which contributed to the patient's worsening anxiety and depression, SI, in order to reduce the risk of self-harm at discharge.     Physician Treatment Plan for Primary Diagnosis: MDD (major depressive disorder), recurrent severe, without psychosis (HCC) Long Term Goal(s): Improvement in symptoms so as ready for discharge   Short Term Goals: Ability to identify changes in lifestyle to reduce recurrence of condition will improve, Ability to verbalize feelings will improve, Ability to disclose and discuss suicidal ideas, Ability to demonstrate self-control will improve, and Ability to identify and develop effective coping behaviors will improve     I certify that inpatient services furnished can reasonably be expected to improve the patient's condition.    Roselle Locus, MD 11/18/2022, 2:45 PM

## 2022-11-18 NOTE — Progress Notes (Signed)
PT Note  Patient Details Name: [name] MRN: [id number] DOB: March 31, 1965   PT order received. Our OT who covers BH will see and assess pt. If any further PT needs, he will let us know. If he can address all issues then PT will sign off.   Thank you     [name] 11/18/2022, 11:04 AM Clois Dupes, PT, MPT Acute Rehabilitation Services Office: [phone] If a weekend: secure chat groups: [facility] PT, [facility] OT, [facility] SLP 11/18/2022

## 2022-11-18 NOTE — Progress Notes (Signed)
D: Patient is alert, oriented, pleasant, and cooperative. Endorses passive SI and AH of his mother. Denies HI, VH, and verbally contracts for safety. Patient reports he slept fair last night. Patient reports his appetite as fair, energy level as low, and concentration as poor. Patient rates his depression 4/10, hopelessness 4/10, and anxiety 7/10. Patient reports stomach pain and headache.     A: Scheduled medications administered per MD order. PRN ibuprofen administered. Patient sent out and came back for abdominal xray. Support provided. Patient educated on safety on the unit and medications. Routine safety checks every 15 minutes. Patient stated understanding to tell nurse about any new physical symptoms. Patient understands to tell staff of any needs.     R: No adverse drug reactions noted. Patient remains safe at this time and will continue to monitor.    11/18/22 1000  Psych Admission Type (Psych Patients Only)  Admission Status Voluntary  Psychosocial Assessment  Patient Complaints Anxiety;Depression  Eye Contact Fair  Facial Expression Anxious  Affect Appropriate to circumstance  Speech Logical/coherent  Interaction Assertive  Motor Activity Other (Comment) (WNL)  Appearance/Hygiene Unremarkable  Behavior Characteristics Cooperative;Appropriate to situation  Mood Depressed;Anxious  Thought Process  Coherency WDL  Content WDL  Delusions None reported or observed  Perception Hallucinations  Hallucination Auditory  Judgment Impaired  Confusion None  Danger to Self  Current suicidal ideation? Passive  Agreement Not to Harm Self Yes  Description of Agreement verbal  Danger to Others  Danger to Others None reported or observed

## 2022-11-18 NOTE — Progress Notes (Signed)
PT Cancellation Note  Patient Details Name: [name] MRN: [id number] DOB: [birth year]   Cancelled Treatment:    Reason Eval/Treat Not Completed: PT screened, no needs identified, will sign off, OT provided evaluation and education. No skilled PT needs at this time.  Blanchard Kelch PT Acute Rehabilitation Services Office [phone] Weekend [phone]    Rada Hay 11/18/2022, 3:25 PM

## 2022-11-18 NOTE — [facility] Group Notes (Signed)
[facility] Group Notes:  (Nursing/MHT/Case Management/Adjunct)  Date:  11/18/2022  Time:  8:27 PM  Type of Therapy:  Wrap Up Group  Participation Level:  Active  Participation Quality:  Appropriate  Affect:  Appropriate  Cognitive:  Appropriate  Insight:  Appropriate  Engagement in Group:  Improving  Modes of Intervention:  Discussion  Summary of Progress/Problems:  [name] E Prabhav Faulkenberry 11/18/2022, 8:27 PM

## 2022-11-18 NOTE — Group Note (Unsigned)
Date:  11/18/2022 Time:  10:49 AM  Group Topic/Focus:  Goals Group:   The focus of this group is to help patients establish daily goals to achieve during treatment and discuss how the patient can incorporate goal setting into their daily lives to aide in recovery.     Participation Level:  {[facility] PARTICIPATION HYQMV:78469}  Participation Quality:  {[facility] PARTICIPATION QUALITY:22265}  Affect:  {[facility] AFFECT:22266}  Cognitive:  {[facility] COGNITIVE:22267}  Insight: {[facility] Insight2:20797}  Engagement in Group:  {[facility] ENGAGEMENT IN GEXBM:84132}  Modes of Intervention:  {[facility] MODES OF INTERVENTION:22269}  Additional Comments:  ***  [name] 11/18/2022, 10:49 AM

## 2022-11-18 NOTE — Progress Notes (Addendum)
   11/16/22 2100  Psych Admission Type (Psych Patients Only)  Admission Status Voluntary  Psychosocial Assessment  Patient Complaints Anxiety  Eye Contact Fair  Facial Expression Anxious  Affect Anxious;Appropriate to circumstance  Speech Logical/coherent  Interaction Assertive  Motor Activity  (WNL)  Appearance/Hygiene In scrubs  Behavior Characteristics Cooperative  Mood Depressed;Anxious  Thought Process  Coherency WDL  Content WDL  Delusions None reported or observed  Perception WDL  Hallucination Auditory  Judgment Impaired  Confusion None  Danger to Self  Current suicidal ideation? Passive  Agreement Not to Harm Self Yes  Description of Agreement verbal  Danger to Others  Danger to Others None reported or observed   Report received from [facility]

## 2022-11-19 DIAGNOSIS — F332 Major depressive disorder, recurrent severe without psychotic features: Secondary | ICD-10-CM | POA: Diagnosis not present

## 2022-11-19 MED ORDER — FLEET ENEMA RE ENEM
1.0000 | ENEMA | Freq: Once | RECTAL | Status: DC
  Filled 2022-11-19: qty 1

## 2022-11-19 MED ORDER — MAGNESIUM CITRATE PO SOLN
1.0000 | Freq: Once | ORAL | Status: AC
  Administered 2022-11-19: 1 via ORAL
  Filled 2022-11-19 (×2): qty 296

## 2022-11-19 MED ORDER — POLYETHYLENE GLYCOL 3350 17 G PO PACK
17.0000 g | PACK | Freq: Two times a day (BID) | ORAL | Status: DC
  Administered 2022-11-20 – 2022-11-22 (×5): 17 g via ORAL
  Filled 2022-11-19 (×10): qty 1

## 2022-11-19 MED ORDER — POLYETHYLENE GLYCOL 3350 17 G PO PACK
17.0000 g | PACK | Freq: Four times a day (QID) | ORAL | Status: DC
  Administered 2022-11-19: 17 g via ORAL
  Filled 2022-11-19 (×10): qty 1

## 2022-11-19 MED ORDER — POLYETHYLENE GLYCOL 3350 17 G PO PACK
17.0000 g | PACK | ORAL | Status: AC
  Filled 2022-11-19 (×2): qty 1

## 2022-11-19 MED ORDER — POLYETHYLENE GLYCOL 3350 17 G PO PACK
17.0000 g | PACK | ORAL | Status: DC
  Administered 2022-11-19 (×2): 17 g via ORAL
  Filled 2022-11-19 (×4): qty 1

## 2022-11-19 NOTE — Progress Notes (Signed)
Patient is alert, oriented, pleasant, and cooperative. Denies SI, HI, AVH, and verbally contracts for safety. Patient reports he slept fair last night without sleeping medication. Patient reports his appetite as poor, energy level as low, and concentration as poor. Patient rates his depression 9/10, hopelessness 10/10, and anxiety 10/10.   Patient reports continued constipation this morning. Patient reported first bowel movement around 1540 today. He reports a second one after that as well. Patient reports improved appetite after bowel movement.    Scheduled medications administered per MD order. PRN tylenol, ibuprofen, and gabapentin administered. Support provided. Patient educated on safety on the unit and medications. Routine safety checks every 15 minutes. Patient stated understanding to tell nurse about any new physical symptoms. Patient understands to tell staff of any needs.     No adverse drug reactions noted. Patient remains safe at this time and will continue to monitor.    11/19/22 1100  Psych Admission Type (Psych Patients Only)  Admission Status Voluntary  Psychosocial Assessment  Patient Complaints Anxiety;Depression  Eye Contact Fair  Facial Expression Anxious  Affect Appropriate to circumstance  Speech Logical/coherent  Interaction Assertive  Motor Activity Other (Comment) (WNL)  Appearance/Hygiene Unremarkable  Behavior Characteristics Cooperative;Appropriate to situation  Mood Depressed;Anxious  Thought Process  Coherency WDL  Content WDL  Delusions None reported or observed  Perception Hallucinations  Hallucination Auditory;Visual  Judgment Impaired  Confusion None  Danger to Self  Current suicidal ideation? Passive  Agreement Not to Harm Self Yes  Description of Agreement verbal  Danger to Others  Danger to Others None reported or observed

## 2022-11-19 NOTE — Progress Notes (Signed)
   11/19/22 2210  Psych Admission Type (Psych Patients Only)  Admission Status Voluntary  Psychosocial Assessment  Patient Complaints Anxiety;Depression  Eye Contact Fair  Facial Expression Anxious  Affect Appropriate to circumstance  Speech Logical/coherent  Interaction Assertive  Motor Activity Other (Comment) (WDL)  Appearance/Hygiene Unremarkable  Behavior Characteristics Appropriate to situation  Mood Pleasant  Thought Process  Coherency WDL  Content WDL  Delusions None reported or observed  Perception Hallucinations  Hallucination Auditory;Visual  Judgment Impaired  Confusion None  Danger to Self  Current suicidal ideation? Denies  Self-Injurious Behavior No self-injurious ideation or behavior indicators observed or expressed   Agreement Not to Harm Self Yes  Description of Agreement verbal  Danger to Others  Danger to Others None reported or observed

## 2022-11-19 NOTE — Group Note (Signed)
Date:  11/19/2022 Time:  9:04 AM  Group Topic/Focus:  Goals Group:   The focus of this group is to help patients establish daily goals to achieve during treatment and discuss how the patient can incorporate goal setting into their daily lives to aide in recovery.    Participation Level:  Active  Participation Quality:  Appropriate  Affect:  Appropriate  Cognitive:  Appropriate  Insight: Appropriate  Engagement in Group:  Engaged  Modes of Intervention:  Discussion  Additional Comments:    [name] 11/19/2022, 9:04 AM

## 2022-11-19 NOTE — [facility] Counselor (Signed)
  11/19/2022  3:20 PM   [name]  Type of note: [facility] referral   CSW sent a referral to [facility] and advised the Pt to call and complete a phone screening.   Signed:  Marya Landry MSW, LCSWA 11/19/2022  3:20 PM

## 2022-11-19 NOTE — Progress Notes (Signed)
[facility] MD Progress Note  11/19/2022 8:48 AM [name]  MRN:  [id number] Subjective:  [name] was seen today in the common area on rounds. He is feeling very bloated and still has not had a bowel movement. He didn't want to eat anything at breakfast. He slept okay. Still having the rare AH, and passive/intermittent SI, but it is not his primary problem. He is highly focused on his abdominal discomfort.   Principal Problem: MDD (major depressive disorder), recurrent severe, without psychosis (HCC) Diagnosis: Principal Problem:   MDD (major depressive disorder), recurrent severe, without psychosis (HCC) Active Problems:   Moderate cocaine use disorder (HCC)   Alcohol dependence (HCC)   Constipation  Total Time spent with patient: 30 minutes  Past Psychiatric History: Prior Psychiatric diagnoses: none Past Psychiatric Hospitalizations: once 20 yrs ago after cut his wrist in suicide attempt   History of self mutilation: denies Past suicide attempts: x 2, 20 yrs ago cut his wrist, 1 and 1/2 yrs ago by OD on fentanyl Past history of HI, violent or aggressive behavior: denies   Past Psychiatric medications trials: none History of ECT/TMS: denies   Outpatient psychiatric Follow up: denies Prior Outpatient Therapy: denies  Past Medical History:  Past Medical History:  Diagnosis Date   Acid reflux    Emphysema of lung (HCC)    Head injury    Hepatitis C    S/p treatment with Epclusa in 2020   Hiatal hernia    HTN (hypertension)    Pulmonary nodules    Stroke [facility])     Past Surgical History:  Procedure Laterality Date   ACROMIO-CLAVICULAR JOINT REPAIR Right 06/28/2018   Procedure: ACROMIO-CLAVICULAR JOINT IRRIGATION AND DEBRIDEMENT;  Surgeon: Cammy Copa, MD;  Location: [facility] OR;  Service: Orthopedics;  Laterality: Right;   FACIAL FRACTURE SURGERY     IRRIGATION AND DEBRIDEMENT SHOULDER Right 06/28/2018   Procedure: IRRIGATION AND DEBRIDEMENT SHOULDER;  Surgeon: Cammy Copa, MD;  Location: [facility] OR;  Service: Orthopedics;  Laterality: Right;   KNEE ARTHROSCOPY Left 06/23/2018   Procedure: LEFT KNEE ARTHROSCOPY KNEE I&D.;  Surgeon: Cammy Copa, MD;  Location: [facility] OR;  Service: Orthopedics;  Laterality: Left;   Family History:  Family History  Problem Relation Age of Onset   Diabetes Mother    Hypertension Mother    Stroke Father    Hypertension Father    Colon cancer Neg Hx    Family Psychiatric  History: Psychiatric illness: denies Suicide: has a notable family history of suicide, with both his maternal grandfather and paternal uncle having taken their own lives.  Substance Abuse: multiple male family members with alcohol dependence Social History:  Social History   Substance and Sexual Activity  Alcohol Use Yes   Comment: 6 pack a week; but last drank 08/12/22.     Social History   Substance and Sexual Activity  Drug Use Yes   Types: Cocaine   Comment: states its "been a long time"     Social History   Socioeconomic History   Marital status: Single    Spouse name: Not on file   Number of children: Not on file   Years of education: Not on file   Highest education level: Not on file  Occupational History   Not on file  Tobacco Use   Smoking status: Some Days    Current packs/day: 2.00    Average packs/day: 2.0 packs/day for 30.0 years (60.0 ttl pk-yrs)    Types: Cigarettes  Smokeless tobacco: Never   Tobacco comments:    1 cig a day since the last visit.  Vaping Use   Vaping status: Never Used  Substance and Sexual Activity   Alcohol use: Yes    Comment: 6 pack a week; but last drank 08/12/22.   Drug use: Yes    Types: Cocaine    Comment: states its "been a long time"    Sexual activity: Not Currently  Other Topics Concern   Not on file  Social History Narrative   Not on file   Social Determinants of Health   Financial Resource Strain: Low Risk  (06/01/2022)   Overall Financial Resource Strain (CARDIA)    Difficulty  of Paying Living Expenses: Not very hard  Food Insecurity: No Food Insecurity (11/11/2022)   Hunger Vital Sign    Worried About Running Out of Food in the Last Year: Never true    Ran Out of Food in the Last Year: Never true  Transportation Needs: No Transportation Needs (11/11/2022)   PRAPARE - Administrator, Civil Service (Medical): No    Lack of Transportation (Non-Medical): No  Physical Activity: Insufficiently Active (06/01/2022)   Exercise Vital Sign    Days of Exercise per Week: 7 days    Minutes of Exercise per Session: 10 min  Stress: No Stress Concern Present (06/01/2022)   [employer] of Occupational Health - Occupational Stress Questionnaire    Feeling of Stress : Only a little  Social Connections: Moderately Isolated (06/01/2022)   Social Connection and Isolation Panel [NHANES]    Frequency of Communication with Friends and Family: More than three times a week    Frequency of Social Gatherings with Friends and Family: More than three times a week    Attends Religious Services: Never    Database administrator or Organizations: No    Attends Engineer, structural: Never    Marital Status: Living with partner   Additional Social History:   Living situation: Was living with his girlfriend in [place] but will not be going back there "my daughter is trying to find me a place" Social support: His oldest daughter [name] is supportive, she lives in [place] Marital Status: Was married once for about 20 years and got divorced 22 years ago Children: 6 children 2 sons and 4 daughters ages 73 years old to 60 years old, he only has relationship with his oldest daughter [name] who is supportive Education: 10th grade Employment: Currently on disability for the past year for medical reasons Military service: Denies Legal history: Has been to jail and the present several times last time to jail 3 years ago last time to prison 20 years ago for "running guns" has  court on October 9 for DUI Trauma: Denies Access to guns: Has guns at girlfriend's house for hunting but he does not plan to go back there    Sleep: Fair  Appetite:  Poor  Current Medications: Current Facility-Administered Medications  Medication Dose Route Frequency Provider Last Rate Last Admin   acetaminophen (TYLENOL) tablet 650 mg  650 mg Oral Q6H PRN Onuoha, Chinwendu V, NP   650 mg at 11/19/22 0501   alum & mag hydroxide-simeth (MAALOX/MYLANTA) 200-200-20 MG/5ML suspension 15 mL  15 mL Oral Q4H PRN Onuoha, Chinwendu V, NP   15 mL at 11/11/22 1154   amLODipine (NORVASC) tablet 10 mg  10 mg Oral Daily Attiah, Nadir, MD   10 mg at 11/19/22 0823   atorvastatin (  LIPITOR) tablet 20 mg  20 mg Oral Daily Abeer Iversen, Shelbie Hutching, MD   20 mg at 11/19/22 0823   cephALEXin (KEFLEX) capsule 500 mg  500 mg Oral Q12H Attiah, Nadir, MD   500 mg at 11/19/22 0820   docusate sodium (COLACE) capsule 100 mg  100 mg Oral Daily Lowell Makara, Shelbie Hutching, MD   100 mg at 11/19/22 0823   FLUoxetine (PROZAC) capsule 20 mg  20 mg Oral Daily Attiah, Nadir, MD   20 mg at 11/19/22 0824   gabapentin (NEURONTIN) capsule 100 mg  100 mg Oral TID PRN Roselle Locus, MD   100 mg at 11/18/22 1617   gabapentin (NEURONTIN) capsule 200 mg  200 mg Oral TID Sarita Bottom, MD   200 mg at 11/19/22 8295   ibuprofen (ADVIL) tablet 800 mg  800 mg Oral Q8H PRN Abbott Pao, Nadir, MD   800 mg at 11/19/22 0827   linaclotide (LINZESS) capsule 145 mcg  145 mcg Oral QAC breakfast Roselle Locus, MD   145 mcg at 11/19/22 6213   LORazepam (ATIVAN) tablet 2 mg  2 mg Oral TID PRN Onuoha, Chinwendu V, NP       Or   LORazepam (ATIVAN) injection 2 mg  2 mg Intramuscular TID PRN Onuoha, Chinwendu V, NP       magnesium citrate solution 1 Bottle  1 Bottle Oral Once Valincia Touch, Shelbie Hutching, MD       magnesium hydroxide (MILK OF MAGNESIA) suspension 15 mL  15 mL Oral Daily PRN Onuoha, Chinwendu V, NP   15 mL at 11/16/22 1821   melatonin tablet 5  mg  5 mg Oral QHS Trenita Hulme, Shelbie Hutching, MD   5 mg at 11/18/22 2101   mirtazapine (REMERON) tablet 7.5 mg  7.5 mg Oral QHS PRN Roselle Locus, MD       multivitamin with minerals tablet 1 tablet  1 tablet Oral Daily Onuoha, Chinwendu V, NP   1 tablet at 11/19/22 0823   pantoprazole (PROTONIX) EC tablet 40 mg  40 mg Oral Daily Attiah, Nadir, MD   40 mg at 11/19/22 0823   risperiDONE (RISPERDAL) tablet 1 mg  1 mg Oral QHS Attiah, Nadir, MD   1 mg at 11/18/22 2101   thiamine (Vitamin B-1) tablet 100 mg  100 mg Oral Daily Onuoha, Chinwendu V, NP   100 mg at 11/19/22 0865    Lab Results: No results found for this or any previous visit (from the past 48 hour(s)).  Blood Alcohol level:  Lab Results  Component Value Date   ETH <10 11/10/2022   ETH <10 12/30/2021    Metabolic Disorder Labs: Lab Results  Component Value Date   HGBA1C 5.5 11/16/2022   MPG 111.15 11/16/2022   MPG 111.15 10/22/2021   No results found for: "PROLACTIN" Lab Results  Component Value Date   CHOL 221 (H) 11/16/2022   TRIG 303 (H) 11/16/2022   HDL 49 11/16/2022   CHOLHDL 4.5 11/16/2022   VLDL 61 (H) 11/16/2022   LDLCALC 111 (H) 11/16/2022   LDLCALC 97 10/22/2021    Physical Findings: AIMS: Facial and Oral Movements Muscles of Facial Expression: None Lips and Perioral Area: None Jaw: None Tongue: None,Extremity Movements Upper (arms, wrists, hands, fingers): None Lower (legs, knees, ankles, toes): None, Trunk Movements Neck, shoulders, hips: None, Global Judgements Severity of abnormal movements overall : None Incapacitation due to abnormal movements: None Patient's awareness of abnormal movements: No Awareness, Dental Status Current problems with teeth  and/or dentures?: No Does patient usually wear dentures?: No  CIWA:  CIWA-Ar Total: 0 COWS:     Musculoskeletal: Strength & Muscle Tone: within normal limits Gait & Station: normal Patient leans: N/A  Psychiatric Specialty  Exam:  Presentation  General Appearance:  Casual  Eye Contact: Fleeting  Speech: Normal Rate  Speech Volume: Normal  Handedness: Right   Mood and Affect  Mood: Anxious; Depressed  Affect: Tearful   Thought Process  Thought Processes: Linear  Descriptions of Associations:Intact  Orientation:Full (Time, Place and Person)  Thought Content:Rumination  History of Schizophrenia/Schizoaffective disorder:No data recorded Duration of Psychotic Symptoms:No data recorded Hallucinations:Hallucinations: Auditory Description of Auditory Hallucinations: voice of mom this morning  Ideas of Reference:None  Suicidal Thoughts:Suicidal Thoughts: Yes, Passive SI Passive Intent and/or Plan: Without Intent; Without Plan  Homicidal Thoughts:Homicidal Thoughts: No   Sensorium  Memory: Immediate Fair; Recent Fair  Judgment: Fair  Insight: Shallow   Executive Functions  Concentration: Fair  Attention Span: Fair  Recall: [employer] of Knowledge: Fair  Language: Fair   Psychomotor Activity  Psychomotor Activity: Psychomotor Activity: Normal   Assets  Assets: Communication Skills   Sleep  Sleep: Sleep: Poor    Physical Exam: Physical Exam Vitals and nursing note reviewed.  Constitutional:      Appearance: Normal appearance.  HENT:     Head: Normocephalic and atraumatic.  Eyes:     Extraocular Movements: Extraocular movements intact.  Abdominal:     General: There is distension.  Musculoskeletal:        General: Normal range of motion.     Cervical back: Normal range of motion.  Neurological:     General: No focal deficit present.     Mental Status: He is alert and oriented to person, place, and time.  Psychiatric:        Behavior: Behavior normal.    Review of Systems  Constitutional:  Negative for fever.  Gastrointestinal:  Positive for constipation. Negative for nausea and vomiting.  Genitourinary:  Negative for dysuria.   Musculoskeletal:  Negative for myalgias.  Psychiatric/Behavioral:  Positive for hallucinations and suicidal ideas.    Blood pressure 116/81, pulse 99, temperature 97.6 F (36.4 C), temperature source Oral, resp. rate 18, height 5\' 10"  (1.778 m), weight 81.2 kg, SpO2 99%. Body mass index is 25.68 kg/m.   Treatment Plan Summary: Daily contact with patient to assess and evaluate symptoms and progress in treatment and Medication management   ASSESSMENT:   Diagnoses / Active Problems: Principal Problem: MDD (major depressive disorder), recurrent severe, without psychosis (HCC) Diagnosis: Principal Problem:   MDD (major depressive disorder), recurrent severe, without psychosis (HCC) Active Problems:   Moderate cocaine use disorder (HCC)   Alcohol dependence (HCC) constipation     PLAN: Safety and Monitoring:             -- Voluntary admission to inpatient psychiatric unit for safety, stabilization and treatment             -- Daily contact with patient to assess and evaluate symptoms and progress in treatment             -- Patient's case to be discussed in multi-disciplinary team meeting             -- Observation Level : q15 minute checks             -- Vital signs:  q12 hours             --  Precautions: suicide, elopement, and assault   Medications:              Continue Prozac 20 mg daily for depression and long-term treatment of anxiety             continue risperidone 1 mg at bedtime to help with mood and psychosis and monitor effects and safety and titrate dosing accordingly             Continue gabapentin 200 mg 3 times daily for alcohol dependence and craving as well as chronic pain, also will help with mood and anxiety             Stopped trazodone, trial of remeron. Trazodone may cause headaches. Also added melatonin             Trial of one time dose of imitrex 25mg  for headache on 10/8 was not helpful              One-time dose of dulcolax suppository 10mg  on 11/17/22,  no result                         -read for the KUB on 11/18/22 is pending - need to rule out obstruction             continue colace 100mg  PO daily for constipation             Stopped vistaril due to significant constipations. Will try using gabapentin 100mg  PO TID PRN anxiety  Mag citrate x 1 on 10/11. Miralax 17g PO four times daily          Continue Ativan taper for alcohol withdrawals and continue to monitor CIWA score             CIWA protocol for alcohol withdrawals and follow-up            Continue pantoprazole 40 mg daily, home medication for GERD           Continue Norvasc 10 mg daily for hypertension, home medication             Continue Keflex 500 mg twice daily for 7 days for finger infection, will monitor -- The risks/benefits/side-effects/alternatives to this medication were discussed in detail with the patient and time was given for questions. The patient consents to medication trial.                            3. Labs Reviewed: lipid panel. Started lipitor 20mg  PO QHS      Lab ordered: n/a   4. Tobacco Use Disorder             -- Patient quit smoking 2 weeks ago, declines NicoDerm patch at this time, will follow             -- Smoking cessation encouraged   5. Group and Therapy: -- Encouraged patient to participate in unit milieu and in scheduled group therapies              --Substance Use counseling: Patient was counseled regarding need to abstain completely from alcohol marijuana and cocaine after discharge.   Patient is interested to go to inpatient residential rehab for substance use rehab after discharge if that is not successful he is in agreement with intensive outpatient rehab treatment, will follow.   6. Discharge Planning:              --  Social work and case management to assist with discharge planning and identification of hospital follow-up needs prior to discharge             -- Estimated LOS: 5-7 days             -- Discharge Concerns: Need to establish  a safety plan; Medication compliance and effectiveness             -- Discharge Goals: Return home with outpatient referrals for mental health follow-up including medication management/psychotherapy     The patient is agreeable with the medication plan, as above. We will monitor the patient's response to pharmacologic treatment, and adjust medications as necessary. Patient is encouraged to participate in group therapy while admitted to the psychiatric unit. We will address other chronic and acute stressors, which contributed to the patient's worsening anxiety and depression, SI, in order to reduce the risk of self-harm at discharge.     Physician Treatment Plan for Primary Diagnosis: MDD (major depressive disorder), recurrent severe, without psychosis (HCC) Long Term Goal(s): Improvement in symptoms so as ready for discharge   Short Term Goals: Ability to identify changes in lifestyle to reduce recurrence of condition will improve, Ability to verbalize feelings will improve, Ability to disclose and discuss suicidal ideas, Ability to demonstrate self-control will improve, and Ability to identify and develop effective coping behaviors will improve     I certify that inpatient services furnished can reasonably be expected to improve the patient's condition.     Roselle Locus, MD 11/19/2022, 8:48 AM

## 2022-11-19 NOTE — Group Note (Signed)
Date:  11/19/2022 Time:  6:21 PM  Group Topic/Focus:  Self Care:   The focus of this group is to help patients understand the importance of self-care in order to improve or restore emotional, physical, spiritual, interpersonal, and financial health.    Participation Level:  Active  Participation Quality:  Appropriate  Affect:  Appropriate  Cognitive:  Appropriate  Insight: Appropriate  Engagement in Group:  Engaged  Modes of Intervention:  Education  Additional Comments:     Reymundo Poll 11/19/2022, 6:21 PM

## 2022-11-19 NOTE — Progress Notes (Signed)
   11/18/22 2037  Psych Admission Type (Psych Patients Only)  Admission Status Voluntary  Psychosocial Assessment  Patient Complaints Anxiety;Depression  Eye Contact Fair  Facial Expression Anxious  Affect Appropriate to circumstance  Speech Logical/coherent  Interaction Assertive  Motor Activity Other (Comment) (WDL)  Appearance/Hygiene Unremarkable  Behavior Characteristics Appropriate to situation;Anxious  Thought Process  Coherency WDL  Content WDL  Delusions None reported or observed  Perception Hallucinations  Hallucination Auditory  Judgment Impaired  Confusion None  Danger to Self  Current suicidal ideation? Denies  Self-Injurious Behavior No self-injurious ideation or behavior indicators observed or expressed   Agreement Not to Harm Self Yes  Description of Agreement verbal  Danger to Others  Danger to Others None reported or observed

## 2022-11-20 DIAGNOSIS — F332 Major depressive disorder, recurrent severe without psychotic features: Secondary | ICD-10-CM | POA: Diagnosis not present

## 2022-11-20 MED ORDER — FLUOXETINE HCL 20 MG PO CAPS
40.0000 mg | ORAL_CAPSULE | Freq: Every day | ORAL | Status: DC
  Administered 2022-11-21 – 2022-11-22 (×2): 40 mg via ORAL
  Filled 2022-11-20 (×5): qty 2

## 2022-11-20 NOTE — Progress Notes (Signed)
   11/20/22 0538  15 Minute Checks  Location Hallway  Visual Appearance Calm  Behavior Composed  Sleep (Behavioral Health Patients Only)  Calculate sleep? (Click Yes once per 24 hr at 0600 safety check) Yes  Documented sleep last 24 hours 6.5

## 2022-11-20 NOTE — Progress Notes (Signed)
   11/20/22 0900  Psych Admission Type (Psych Patients Only)  Admission Status Voluntary  Psychosocial Assessment  Patient Complaints Anxiety;Depression  Eye Contact Fair  Facial Expression Animated  Affect Appropriate to circumstance  Speech Logical/coherent  Interaction Assertive  Motor Activity Other (Comment) (wnl)  Appearance/Hygiene Unremarkable  Behavior Characteristics Appropriate to situation;Cooperative  Mood Depressed;Pleasant  Thought Process  Coherency WDL  Content WDL  Delusions None reported or observed  Perception Hallucinations  Hallucination Auditory;Visual  Judgment Poor  Confusion None  Danger to Self  Current suicidal ideation? Denies  Self-Injurious Behavior No self-injurious ideation or behavior indicators observed or expressed   Agreement Not to Harm Self Yes  Description of Agreement verbal  Danger to Others  Danger to Others None reported or observed

## 2022-11-20 NOTE — Plan of Care (Signed)
Problem: Education: Goal: Mental status will improve Outcome: Progressing   Problem: Activity: Goal: Interest or engagement in activities will improve Outcome: Progressing

## 2022-11-20 NOTE — Plan of Care (Signed)

## 2022-11-20 NOTE — Progress Notes (Signed)
   11/20/22 2239  Psych Admission Type (Psych Patients Only)  Admission Status Voluntary  Psychosocial Assessment  Patient Complaints Anxiety  Eye Contact Fair  Facial Expression Animated  Affect Appropriate to circumstance  Speech Logical/coherent  Interaction Assertive;Needy  Motor Activity Other (Comment) (WDL)  Appearance/Hygiene Unremarkable  Behavior Characteristics Appropriate to situation  Mood Anxious;Pleasant  Thought Process  Coherency WDL  Content WDL  Delusions None reported or observed  Perception WDL  Hallucination Auditory;Visual  Judgment Poor  Confusion None  Danger to Self  Current suicidal ideation? Denies  Self-Injurious Behavior No self-injurious ideation or behavior indicators observed or expressed   Agreement Not to Harm Self Yes  Description of Agreement verbal  Danger to Others  Danger to Others None reported or observed

## 2022-11-20 NOTE — Progress Notes (Addendum)
[facility] MD Progress Note  11/20/2022 3:07 PM [name]  MRN:  [id number] Subjective:  Ethin was seen a few times today in the common area. He feels that he is doing better overall, especially now that he is passing some (mostly liquid) stools. He slept better and is eating again. Still low level AH and intermittent SI.   Principal Problem: MDD (major depressive disorder), recurrent severe, without psychosis (HCC) Diagnosis: Principal Problem:   MDD (major depressive disorder), recurrent severe, without psychosis (HCC) Active Problems:   Moderate cocaine use disorder (HCC)   Alcohol dependence (HCC)   Constipation  Total Time spent with patient: 15 minutes  Past Psychiatric History: Prior Psychiatric diagnoses: none Past Psychiatric Hospitalizations: once 20 yrs ago after cut his wrist in suicide attempt   History of self mutilation: denies Past suicide attempts: x 2, 20 yrs ago cut his wrist, 1 and 1/2 yrs ago by OD on fentanyl Past history of HI, violent or aggressive behavior: denies   Past Psychiatric medications trials: none History of ECT/TMS: denies   Outpatient psychiatric Follow up: denies Prior Outpatient Therapy: denies  Past Medical History:  Past Medical History:  Diagnosis Date   Acid reflux    Emphysema of lung (HCC)    Head injury    Hepatitis C    S/p treatment with Epclusa in 2020   Hiatal hernia    HTN (hypertension)    Pulmonary nodules    Stroke [facility])     Past Surgical History:  Procedure Laterality Date   ACROMIO-CLAVICULAR JOINT REPAIR Right 06/28/2018   Procedure: ACROMIO-CLAVICULAR JOINT IRRIGATION AND DEBRIDEMENT;  Surgeon: Cammy Copa, MD;  Location: [facility] OR;  Service: Orthopedics;  Laterality: Right;   FACIAL FRACTURE SURGERY     IRRIGATION AND DEBRIDEMENT SHOULDER Right 06/28/2018   Procedure: IRRIGATION AND DEBRIDEMENT SHOULDER;  Surgeon: Cammy Copa, MD;  Location: [facility] OR;  Service: Orthopedics;  Laterality: Right;   KNEE  ARTHROSCOPY Left 06/23/2018   Procedure: LEFT KNEE ARTHROSCOPY KNEE I&D.;  Surgeon: Cammy Copa, MD;  Location: [facility] OR;  Service: Orthopedics;  Laterality: Left;   Family History:  Family History  Problem Relation Age of Onset   Diabetes Mother    Hypertension Mother    Stroke Father    Hypertension Father    Colon cancer Neg Hx    Family Psychiatric  History: Psychiatric illness: denies Suicide: has a notable family history of suicide, with both his maternal grandfather and paternal uncle having taken their own lives.  Social History:  Social History   Substance and Sexual Activity  Alcohol Use Yes   Comment: 6 pack a week; but last drank 08/12/22.     Social History   Substance and Sexual Activity  Drug Use Yes   Types: Cocaine   Comment: states its "been a long time"     Social History   Socioeconomic History   Marital status: Single    Spouse name: Not on file   Number of children: Not on file   Years of education: Not on file   Highest education level: Not on file  Occupational History   Not on file  Tobacco Use   Smoking status: Some Days    Current packs/day: 2.00    Average packs/day: 2.0 packs/day for 30.0 years (60.0 ttl pk-yrs)    Types: Cigarettes   Smokeless tobacco: Never   Tobacco comments:    1 cig a day since the last visit.  Vaping Use  Vaping status: Never Used  Substance and Sexual Activity   Alcohol use: Yes    Comment: 6 pack a week; but last drank 08/12/22.   Drug use: Yes    Types: Cocaine    Comment: states its "been a long time"    Sexual activity: Not Currently  Other Topics Concern   Not on file  Social History Narrative   Not on file   Social Determinants of Health   Financial Resource Strain: Low Risk  (06/01/2022)   Overall Financial Resource Strain (CARDIA)    Difficulty of Paying Living Expenses: Not very hard  Food Insecurity: No Food Insecurity (11/11/2022)   Hunger Vital Sign    Worried About Running Out of Food  in the Last Year: Never true    Ran Out of Food in the Last Year: Never true  Transportation Needs: No Transportation Needs (11/11/2022)   PRAPARE - Administrator, Civil Service (Medical): No    Lack of Transportation (Non-Medical): No  Physical Activity: Insufficiently Active (06/01/2022)   Exercise Vital Sign    Days of Exercise per Week: 7 days    Minutes of Exercise per Session: 10 min  Stress: No Stress Concern Present (06/01/2022)   [employer] of Occupational Health - Occupational Stress Questionnaire    Feeling of Stress : Only a little  Social Connections: Moderately Isolated (06/01/2022)   Social Connection and Isolation Panel [NHANES]    Frequency of Communication with Friends and Family: More than three times a week    Frequency of Social Gatherings with Friends and Family: More than three times a week    Attends Religious Services: Never    Database administrator or Organizations: No    Attends Engineer, structural: Never    Marital Status: Living with partner   Additional Social History:  Living situation: Was living with his girlfriend in [place] but will not be going back there "my daughter is trying to find me a place" Social support: His oldest daughter [name] is supportive, she lives in [place] Marital Status: Was married once for about 20 years and got divorced 22 years ago Children: 6 children 2 sons and 4 daughters ages 7 years old to 57 years old, he only has relationship with his oldest daughter [name] who is supportive Education: 10th grade Employment: Currently on disability for the past year for medical reasons Military service: Denies Legal history: Has been to jail and the present several times last time to jail 3 years ago last time to prison 20 years ago for "running guns" has court on October 9 for DUI Trauma: Denies Access to guns: Has guns at girlfriend's house for hunting but he does not plan to go back there         Sleep: Fair  Appetite:  Fair  Current Medications: Current Facility-Administered Medications  Medication Dose Route Frequency Provider Last Rate Last Admin   acetaminophen (TYLENOL) tablet 650 mg  650 mg Oral Q6H PRN Onuoha, Chinwendu V, NP   650 mg at 11/20/22 1140   alum & mag hydroxide-simeth (MAALOX/MYLANTA) 200-200-20 MG/5ML suspension 15 mL  15 mL Oral Q4H PRN Onuoha, Chinwendu V, NP   15 mL at 11/11/22 1154   amLODipine (NORVASC) tablet 10 mg  10 mg Oral Daily Attiah, Nadir, MD   10 mg at 11/20/22 0744   atorvastatin (LIPITOR) tablet 20 mg  20 mg Oral Daily Dejean Tribby, Shelbie Hutching, MD   20 mg at 11/20/22 0745  docusate sodium (COLACE) capsule 100 mg  100 mg Oral Daily Cheryl Chay, Shelbie Hutching, MD   100 mg at 11/20/22 0745   FLUoxetine (PROZAC) capsule 20 mg  20 mg Oral Daily Abbott Pao, Nadir, MD   20 mg at 11/20/22 0745   gabapentin (NEURONTIN) capsule 100 mg  100 mg Oral TID PRN Roselle Locus, MD   100 mg at 11/19/22 1204   gabapentin (NEURONTIN) capsule 200 mg  200 mg Oral TID Sarita Bottom, MD   200 mg at 11/20/22 1408   ibuprofen (ADVIL) tablet 800 mg  800 mg Oral Q8H PRN Sarita Bottom, MD   800 mg at 11/20/22 1312   linaclotide (LINZESS) capsule 145 mcg  145 mcg Oral QAC breakfast Roselle Locus, MD   145 mcg at 11/20/22 0604   LORazepam (ATIVAN) tablet 2 mg  2 mg Oral TID PRN Onuoha, Chinwendu V, NP       Or   LORazepam (ATIVAN) injection 2 mg  2 mg Intramuscular TID PRN Onuoha, Chinwendu V, NP       magnesium hydroxide (MILK OF MAGNESIA) suspension 15 mL  15 mL Oral Daily PRN Onuoha, Chinwendu V, NP   15 mL at 11/16/22 1821   melatonin tablet 5 mg  5 mg Oral QHS Thuan Tippett, Shelbie Hutching, MD   5 mg at 11/19/22 2109   mirtazapine (REMERON) tablet 7.5 mg  7.5 mg Oral QHS PRN Roselle Locus, MD   7.5 mg at 11/19/22 2108   multivitamin with minerals tablet 1 tablet  1 tablet Oral Daily Onuoha, Chinwendu V, NP   1 tablet at 11/20/22 0744   pantoprazole (PROTONIX) EC  tablet 40 mg  40 mg Oral Daily Attiah, Nadir, MD   40 mg at 11/20/22 0745   polyethylene glycol (MIRALAX / GLYCOLAX) packet 17 g  17 g Oral BID Massengill, Harrold Donath, MD   17 g at 11/20/22 1048   risperiDONE (RISPERDAL) tablet 1 mg  1 mg Oral QHS Attiah, Nadir, MD   1 mg at 11/19/22 2109   sodium phosphate (FLEET) enema 1 enema  1 enema Rectal Once Massengill, Harrold Donath, MD       thiamine (Vitamin B-1) tablet 100 mg  100 mg Oral Daily Onuoha, Chinwendu V, NP   100 mg at 11/20/22 0745    Lab Results: No results found for this or any previous visit (from the past 48 hour(s)).  Blood Alcohol level:  Lab Results  Component Value Date   ETH <10 11/10/2022   ETH <10 12/30/2021    Metabolic Disorder Labs: Lab Results  Component Value Date   HGBA1C 5.5 11/16/2022   MPG 111.15 11/16/2022   MPG 111.15 10/22/2021   No results found for: "PROLACTIN" Lab Results  Component Value Date   CHOL 221 (H) 11/16/2022   TRIG 303 (H) 11/16/2022   HDL 49 11/16/2022   CHOLHDL 4.5 11/16/2022   VLDL 61 (H) 11/16/2022   LDLCALC 111 (H) 11/16/2022   LDLCALC 97 10/22/2021    Physical Findings: AIMS: Facial and Oral Movements Muscles of Facial Expression: None Lips and Perioral Area: None Jaw: None Tongue: None,Extremity Movements Upper (arms, wrists, hands, fingers): None Lower (legs, knees, ankles, toes): None, Trunk Movements Neck, shoulders, hips: None, Global Judgements Severity of abnormal movements overall : None Incapacitation due to abnormal movements: None Patient's awareness of abnormal movements: No Awareness, Dental Status Current problems with teeth and/or dentures?: No Does patient usually wear dentures?: No  CIWA:  CIWA-Ar Total: 0 COWS:  Musculoskeletal: Strength & Muscle Tone: within normal limits Gait & Station: normal Patient leans: N/A  Psychiatric Specialty Exam:  Presentation  General Appearance:  Casual  Eye Contact: Fair  Speech: Normal Rate  Speech  Volume: Normal  Handedness: Right   Mood and Affect  Mood: Anxious; Depressed  Affect: Congruent   Thought Process  Thought Processes: Linear  Descriptions of Associations:Intact  Orientation:Full (Time, Place and Person)  Thought Content:Logical  History of Schizophrenia/Schizoaffective disorder:No data recorded Duration of Psychotic Symptoms:No data recorded Hallucinations:Hallucinations: Auditory  Ideas of Reference:None  Suicidal Thoughts:Suicidal Thoughts: Yes, Passive SI Passive Intent and/or Plan: Without Intent; With Plan  Homicidal Thoughts:Homicidal Thoughts: No   Sensorium  Memory: Immediate Fair; Recent Fair; Remote Fair  Judgment: Fair  Insight: Fair   Art therapist  Concentration: Fair  Attention Span: Fair  Recall: Fair  Fund of Knowledge: Good  Language: Good   Psychomotor Activity  Psychomotor Activity: Psychomotor Activity: Normal   Assets  Assets: Communication Skills; Desire for Improvement   Sleep  Sleep: Sleep: Fair    Physical Exam: Physical Exam Vitals and nursing note reviewed.  Constitutional:      Appearance: Normal appearance.  HENT:     Head: Normocephalic and atraumatic.  Eyes:     Extraocular Movements: Extraocular movements intact.  Pulmonary:     Effort: Pulmonary effort is normal.  Abdominal:     Palpations: Abdomen is soft.  Musculoskeletal:        General: Normal range of motion.     Cervical back: Normal range of motion.  Neurological:     General: No focal deficit present.     Mental Status: He is alert and oriented to person, place, and time.  Psychiatric:        Behavior: Behavior normal.    Review of Systems  Constitutional:  Negative for fever.  Cardiovascular:  Negative for chest pain.  Gastrointestinal:  Positive for constipation and diarrhea.  Genitourinary:  Negative for dysuria.  Skin:  Negative for rash.  Psychiatric/Behavioral:  Positive for depression,  hallucinations and suicidal ideas.    Blood pressure (!) 127/96, pulse 89, temperature 97.6 F (36.4 C), temperature source Oral, resp. rate 20, height 5\' 10"  (1.778 m), weight 81.2 kg, SpO2 99%. Body mass index is 25.68 kg/m.   Treatment Plan Summary:  Daily contact with patient to assess and evaluate symptoms and progress in treatment and Medication management   ASSESSMENT:   Diagnoses / Active Problems: Principal Problem: MDD (major depressive disorder), recurrent severe, without psychosis (HCC) Diagnosis: Principal Problem:   MDD (major depressive disorder), recurrent severe, without psychosis (HCC) Active Problems:   Moderate cocaine use disorder (HCC)   Alcohol dependence (HCC) constipation     PLAN: Safety and Monitoring:             -- Voluntary admission to inpatient psychiatric unit for safety, stabilization and treatment             -- Daily contact with patient to assess and evaluate symptoms and progress in treatment             -- Patient's case to be discussed in multi-disciplinary team meeting             -- Observation Level : q15 minute checks             -- Vital signs:  q12 hours             -- Precautions: suicide, elopement, and assault  Medications:              Increase Prozac 40 mg daily for depression and long-term treatment of anxiety             continue risperidone 1 mg at bedtime to help with mood and psychosis and monitor effects and safety and titrate dosing accordingly             Continue gabapentin 200 mg 3 times daily for alcohol dependence and craving as well as chronic pain, also will help with mood and anxiety             Stopped trazodone, trial of remeron. Trazodone may cause headaches. Also added melatonin             Trial of one time dose of imitrex 25mg  for headache on 10/8 was not helpful              One-time dose of dulcolax suppository 10mg  on 11/17/22, no result                         -read for the KUB on 11/18/22 is pending -  need to rule out obstruction             continue colace 100mg  PO daily for constipation             Stopped vistaril due to significant constipations. Will try using gabapentin 100mg  PO TID PRN anxiety             Mag citrate x 1 on 10/11. Miralax 17g PO four times daily          Continue Ativan taper for alcohol withdrawals and continue to monitor CIWA score             CIWA protocol for alcohol withdrawals and follow-up            Continue pantoprazole 40 mg daily, home medication for GERD           Continue Norvasc 10 mg daily for hypertension, home medication             Continue Keflex 500 mg twice daily for 7 days for finger infection, will monitor                            3. Labs Reviewed: n/a      Lab ordered: n/a   4. Tobacco Use Disorder             -- Patient quit smoking 2 weeks ago, declines NicoDerm patch at this time, will follow             -- Smoking cessation encouraged   5. Group and Therapy: -- Encouraged patient to participate in unit milieu and in scheduled group therapies              --Substance Use counseling: Patient was counseled regarding need to abstain completely from alcohol marijuana and cocaine after discharge.   Patient is interested to go to inpatient residential rehab for substance use rehab after discharge if that is not successful he is in agreement with intensive outpatient rehab treatment, will follow.   6. Discharge Planning:              -- Social work and case management to assist with discharge planning and identification of hospital follow-up needs prior to discharge             --  Estimated LOS: 5-7 days             -- Discharge Concerns: Need to establish a safety plan; Medication compliance and effectiveness             -- Discharge Goals: Return home with outpatient referrals for mental health follow-up including medication management/psychotherapy     The patient is agreeable with the medication plan, as above. We will monitor the  patient's response to pharmacologic treatment, and adjust medications as necessary. Patient is encouraged to participate in group therapy while admitted to the psychiatric unit. We will address other chronic and acute stressors, which contributed to the patient's worsening anxiety and depression, SI, in order to reduce the risk of self-harm at discharge.     Physician Treatment Plan for Primary Diagnosis: MDD (major depressive disorder), recurrent severe, without psychosis (HCC) Long Term Goal(s): Improvement in symptoms so as ready for discharge   Short Term Goals: Ability to identify changes in lifestyle to reduce recurrence of condition will improve, Ability to verbalize feelings will improve, Ability to disclose and discuss suicidal ideas, Ability to demonstrate self-control will improve, and Ability to identify and develop effective coping behaviors will improve     I certify that inpatient services furnished can reasonably be expected to improve the patient's condition.    Roselle Locus, MD 11/20/2022, 3:07 PM

## 2022-11-20 NOTE — Group Note (Unsigned)
Date:  11/20/2022 Time:  11:57 PM  Group Topic/Focus:  Wrap-Up Group:   The focus of this group is to help patients review their daily goal of treatment and discuss progress on daily workbooks.    Participation Level:  None  Participation Quality:   N/A  Affect:   N/A  Cognitive:   N/A  Insight: None  Engagement in Group:  None  Modes of Intervention:   N/A  Additional Comments:  Patient attended group but left group half way through.   [name] 11/20/2022, 11:57 PM

## 2022-11-21 DIAGNOSIS — F332 Major depressive disorder, recurrent severe without psychotic features: Secondary | ICD-10-CM | POA: Diagnosis not present

## 2022-11-21 MED ORDER — LINACLOTIDE 290 MCG PO CAPS
290.0000 ug | ORAL_CAPSULE | Freq: Every day | ORAL | Status: DC
  Administered 2022-11-22: 290 ug via ORAL
  Filled 2022-11-21 (×3): qty 1

## 2022-11-21 NOTE — Progress Notes (Signed)
   11/21/22 0540  15 Minute Checks  Location Hallway  Visual Appearance Calm  Behavior Composed  Sleep (Behavioral Health Patients Only)  Calculate sleep? (Click Yes once per 24 hr at 0600 safety check) Yes  Documented sleep last 24 hours 6.25

## 2022-11-21 NOTE — Plan of Care (Signed)
Problem: Activity: Goal: Sleeping patterns will improve Outcome: Progressing   Problem: Coping: Goal: Ability to demonstrate self-control will improve Outcome: Progressing

## 2022-11-21 NOTE — Group Note (Incomplete)
Date:  11/20/2022 Time:  11:57 PM  Group Topic/Focus:  Wrap-Up Group:   The focus of this group is to help patients review their daily goal of treatment and discuss progress on daily workbooks.    Participation Level:  None  Participation Quality:   N/A  Affect:   N/A  Cognitive:   N/A  Insight: None  Engagement in Group:  None  Modes of Intervention:   N/A  Additional Comments:  Patient attended group but left group half way through.   [name] 11/20/2022, 11:57 PM

## 2022-11-21 NOTE — [facility] Group Notes (Signed)
Type of Therapy and Topic:  Group Therapy: Gratitude  Participation Level:  Minimal   Description of Group:   In this group, patients shared and discussed the importance of acknowledging the elements in their lives for which they are grateful and how this can positively impact their mood.  The group discussed how bringing the positive elements of their lives to the forefront of their minds can help with recovery from any illness, physical or mental.  An exercise was done as a group in which a list was made of gratitude items in order to encourage participants to consider other potential positives in their lives.  Therapeutic Goals: Patients will identify one or more item for which they are grateful in each of 6 categories:  people, experiences, things, places, skills, and other. Patients will discuss how it is possible to seek out gratitude in even bad situations. Patients will explore other possible items of gratitude that they could remember.   Summary of Patient Progress:  The patient shared that he is grateful for NA.  Patient's reaction to the group was patient stated that he was doesn't have nothing much to say.  Therapeutic Modalities:   Solution-Focused Therapy Activity
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
## 2022-11-21 NOTE — Progress Notes (Signed)
[facility] MD Progress Note  11/21/2022 2:34 PM [name]  MRN:  [id number] Subjective:  [name] was seen a couple of times today. He is worried about his ankle swelling, states that this is new. He says that he was diagnosed with blood clots a few months ago and started on thinners. His outpatient PCP took him off and he is not sure why that is, or if he should be on them. He had recurrent constipation again yesterday. His right inguinal hernia bothers him today. He had tried to call [facility] a few times yesterday but there was no answer. He is still struggling with grief about his mother's death. He talks to her about his troubles. She is not talking back today. He is sleeping and eating okay. No current suicidal ideation.   Principal Problem: MDD (major depressive disorder), recurrent severe, without psychosis (HCC) Diagnosis: Principal Problem:   MDD (major depressive disorder), recurrent severe, without psychosis (HCC) Active Problems:   Moderate cocaine use disorder (HCC)   Alcohol dependence (HCC)   Constipation  Total Time spent with patient: 30 minutes  Past Psychiatric History: Prior Psychiatric diagnoses: none Past Psychiatric Hospitalizations: once 20 yrs ago after cut his wrist in suicide attempt   History of self mutilation: denies Past suicide attempts: x 2, 20 yrs ago cut his wrist, 1 and 1/2 yrs ago by OD on fentanyl Past history of HI, violent or aggressive behavior: denies   Past Psychiatric medications trials: none History of ECT/TMS: denies   Outpatient psychiatric Follow up: denies Prior Outpatient Therapy: denies  Past Medical History:  Past Medical History:  Diagnosis Date   Acid reflux    Emphysema of lung (HCC)    Head injury    Hepatitis C    S/p treatment with Epclusa in 2020   Hiatal hernia    HTN (hypertension)    Pulmonary nodules    Stroke [facility])     Past Surgical History:  Procedure Laterality Date   ACROMIO-CLAVICULAR JOINT REPAIR Right 06/28/2018    Procedure: ACROMIO-CLAVICULAR JOINT IRRIGATION AND DEBRIDEMENT;  Surgeon: Cammy Copa, MD;  Location: [facility] OR;  Service: Orthopedics;  Laterality: Right;   FACIAL FRACTURE SURGERY     IRRIGATION AND DEBRIDEMENT SHOULDER Right 06/28/2018   Procedure: IRRIGATION AND DEBRIDEMENT SHOULDER;  Surgeon: Cammy Copa, MD;  Location[name] OR;  Service: Orthopedics;  Laterality: Right;   KNEE ARTHROSCOPY Left 06/23/2018   Procedure: LEFT KNEE ARTHROSCOPY KNEE I&D.;  Surgeon: Cammy Copa, MD;  Location: [facility] OR;  Service: Orthopedics;  Laterality: Left;   Family History:  Family History  Problem Relation Age of Onset   Diabetes Mother    Hypertension Mother    Stroke Father    Hypertension Father    Colon cancer Neg Hx    Family Psychiatric  History: Psychiatric illness: denies Suicide: has a notable family history of suicide, with both his maternal grandfather and paternal uncle having taken their own lives.  Social History:  Social History   Substance and Sexual Activity  Alcohol Use Yes   Comment: 6 pack a week; but last drank 08/12/22.     Social History   Substance and Sexual Activity  Drug Use Yes   Types: Cocaine   Comment: states its "been a long time"     Social History   Socioeconomic History   Marital status: Single    Spouse name: Not on file   Number of children: Not on file   Years of education:  Not on file   Highest education level: Not on file  Occupational History   Not on file  Tobacco Use   Smoking status: Some Days    Current packs/day: 2.00    Average packs/day: 2.0 packs/day for 30.0 years (60.0 ttl pk-yrs)    Types: Cigarettes   Smokeless tobacco: Never   Tobacco comments:    1 cig a day since the last visit.  Vaping Use   Vaping status: Never Used  Substance and Sexual Activity   Alcohol use: Yes    Comment: 6 pack a week; but last drank 08/12/22.   Drug use: Yes    Types: Cocaine    Comment: states its "been a long time"    Sexual  activity: Not Currently  Other Topics Concern   Not on file  Social History Narrative   Not on file   Social Determinants of Health   Financial Resource Strain: Low Risk  (06/01/2022)   Overall Financial Resource Strain (CARDIA)    Difficulty of Paying Living Expenses: Not very hard  Food Insecurity: No Food Insecurity (11/11/2022)   Hunger Vital Sign    Worried About Running Out of Food in the Last Year: Never true    Ran Out of Food in the Last Year: Never true  Transportation Needs: No Transportation Needs (11/11/2022)   PRAPARE - Administrator, Civil Service (Medical): No    Lack of Transportation (Non-Medical): No  Physical Activity: Insufficiently Active (06/01/2022)   Exercise Vital Sign    Days of Exercise per Week: 7 days    Minutes of Exercise per Session: 10 min  Stress: No Stress Concern Present (06/01/2022)   [employer] of Occupational Health - Occupational Stress Questionnaire    Feeling of Stress : Only a little  Social Connections: Moderately Isolated (06/01/2022)   Social Connection and Isolation Panel [NHANES]    Frequency of Communication with Friends and Family: More than three times a week    Frequency of Social Gatherings with Friends and Family: More than three times a week    Attends Religious Services: Never    Database administrator or Organizations: No    Attends Engineer, structural: Never    Marital Status: Living with partner   Additional Social History:   Living situation: Was living with his girlfriend in [place] but will not be going back there "my daughter is trying to find me a place" Social support: His oldest daughter [name] is supportive, she lives in [place] Marital Status: Was married once for about 20 years and got divorced 22 years ago Children: 6 children 2 sons and 4 daughters ages 44 years old to 46 years old, he only has relationship with his oldest daughter [name] who is supportive Education: 10th  grade Employment: Currently on disability for the past year for medical reasons Military service: Denies Legal history: Has been to jail and the present several times last time to jail 3 years ago last time to prison 20 years ago for "running guns" has court on October 9 for DUI Trauma: Denies Access to guns: Has guns at girlfriend's house for hunting but he does not plan to go back there        Sleep: Fair  Appetite:  Fair  Current Medications: Current Facility-Administered Medications  Medication Dose Route Frequency Provider Last Rate Last Admin   acetaminophen (TYLENOL) tablet 650 mg  650 mg Oral Q6H PRN Onuoha, Chinwendu V, NP   650  mg at 11/21/22 1407   alum & mag hydroxide-simeth (MAALOX/MYLANTA) 200-200-20 MG/5ML suspension 15 mL  15 mL Oral Q4H PRN Onuoha, Chinwendu V, NP   15 mL at 11/11/22 1154   amLODipine (NORVASC) tablet 10 mg  10 mg Oral Daily Attiah, Nadir, MD   10 mg at 11/21/22 0758   atorvastatin (LIPITOR) tablet 20 mg  20 mg Oral Daily Charliee Krenz, Shelbie Hutching, MD   20 mg at 11/21/22 0756   docusate sodium (COLACE) capsule 100 mg  100 mg Oral Daily Roselle Locus, MD   100 mg at 11/21/22 0757   FLUoxetine (PROZAC) capsule 40 mg  40 mg Oral Daily Roselle Locus, MD   40 mg at 11/21/22 0756   gabapentin (NEURONTIN) capsule 100 mg  100 mg Oral TID PRN Roselle Locus, MD   100 mg at 11/19/22 1204   gabapentin (NEURONTIN) capsule 200 mg  200 mg Oral TID Sarita Bottom, MD   200 mg at 11/21/22 0757   ibuprofen (ADVIL) tablet 800 mg  800 mg Oral Q8H PRN Abbott Pao, Nadir, MD   800 mg at 11/21/22 0520   [START ON 11/22/2022] linaclotide (LINZESS) capsule 290 mcg  290 mcg Oral QAC breakfast Kinslei Labine, Shelbie Hutching, MD       LORazepam (ATIVAN) tablet 2 mg  2 mg Oral TID PRN Onuoha, Chinwendu V, NP       Or   LORazepam (ATIVAN) injection 2 mg  2 mg Intramuscular TID PRN Onuoha, Chinwendu V, NP       magnesium hydroxide (MILK OF MAGNESIA) suspension 15 mL  15 mL Oral  Daily PRN Onuoha, Chinwendu V, NP   15 mL at 11/16/22 1821   melatonin tablet 5 mg  5 mg Oral QHS Josemiguel Gries, Shelbie Hutching, MD   5 mg at 11/20/22 2055   mirtazapine (REMERON) tablet 7.5 mg  7.5 mg Oral QHS PRN Roselle Locus, MD   7.5 mg at 11/20/22 2055   multivitamin with minerals tablet 1 tablet  1 tablet Oral Daily Onuoha, Chinwendu V, NP   1 tablet at 11/21/22 0758   pantoprazole (PROTONIX) EC tablet 40 mg  40 mg Oral Daily Attiah, Nadir, MD   40 mg at 11/21/22 0756   polyethylene glycol (MIRALAX / GLYCOLAX) packet 17 g  17 g Oral BID Massengill, Harrold Donath, MD   17 g at 11/21/22 0756   risperiDONE (RISPERDAL) tablet 1 mg  1 mg Oral QHS Attiah, Nadir, MD   1 mg at 11/20/22 2055   thiamine (Vitamin B-1) tablet 100 mg  100 mg Oral Daily Onuoha, Chinwendu V, NP   100 mg at 11/21/22 0756    Lab Results: No results found for this or any previous visit (from the past 48 hour(s)).  Blood Alcohol level:  Lab Results  Component Value Date   ETH <10 11/10/2022   ETH <10 12/30/2021    Metabolic Disorder Labs: Lab Results  Component Value Date   HGBA1C 5.5 11/16/2022   MPG 111.15 11/16/2022   MPG 111.15 10/22/2021   No results found for: "PROLACTIN" Lab Results  Component Value Date   CHOL 221 (H) 11/16/2022   TRIG 303 (H) 11/16/2022   HDL 49 11/16/2022   CHOLHDL 4.5 11/16/2022   VLDL 61 (H) 11/16/2022   LDLCALC 111 (H) 11/16/2022   LDLCALC 97 10/22/2021    Physical Findings: AIMS: Facial and Oral Movements Muscles of Facial Expression: None Lips and Perioral Area: None Jaw: None Tongue: None,Extremity Movements Upper (  arms, wrists, hands, fingers): None Lower (legs, knees, ankles, toes): None, Trunk Movements Neck, shoulders, hips: None, Global Judgements Severity of abnormal movements overall : None Incapacitation due to abnormal movements: None Patient's awareness of abnormal movements: No Awareness, Dental Status Current problems with teeth and/or dentures?: No Does  patient usually wear dentures?: No  CIWA:  CIWA-Ar Total: 0 COWS:     Musculoskeletal: Strength & Muscle Tone: within normal limits Gait & Station: normal Patient leans: N/A  Psychiatric Specialty Exam:  Presentation  General Appearance:  Appropriate for Environment; Casual  Eye Contact: Fair  Speech: Normal Rate  Speech Volume: Normal  Handedness: Right   Mood and Affect  Mood: Anxious; Depressed  Affect: Congruent   Thought Process  Thought Processes: Linear  Descriptions of Associations:Intact  Orientation:Full (Time, Place and Person)  Thought Content:Logical  History of Schizophrenia/Schizoaffective disorder:No data recorded Duration of Psychotic Symptoms:No data recorded Hallucinations:Hallucinations: None  Ideas of Reference:None  Suicidal Thoughts:Suicidal Thoughts: No SI Passive Intent and/or Plan: Without Intent; With Plan  Homicidal Thoughts:Homicidal Thoughts: No   Sensorium  Memory: Immediate Good; Recent Fair; Remote Fair  Judgment: Fair  Insight: Fair   Art therapist  Concentration: Fair  Attention Span: Fair  Recall: Fair  Fund of Knowledge: Good  Language: Good   Psychomotor Activity  Psychomotor Activity: Psychomotor Activity: Normal   Assets  Assets: Communication Skills; Desire for Improvement   Sleep  Sleep: Sleep: Fair    Physical Exam: Physical Exam Vitals and nursing note reviewed.  Constitutional:      Appearance: Normal appearance.  HENT:     Head: Normocephalic and atraumatic.  Eyes:     Extraocular Movements: Extraocular movements intact.  Pulmonary:     Effort: Pulmonary effort is normal.  Musculoskeletal:     Cervical back: Normal range of motion.     Right lower leg: 1+ Pitting Edema present.     Left lower leg: 1+ Pitting Edema present.  Neurological:     General: No focal deficit present.     Mental Status: He is alert and oriented to person, place, and time.   Psychiatric:        Behavior: Behavior normal.    Review of Systems  Constitutional:  Negative for fever.  Respiratory:  Negative for cough.   Cardiovascular:  Positive for leg swelling.  Gastrointestinal:  Positive for constipation and diarrhea. Negative for nausea and vomiting.  Genitourinary:  Negative for dysuria.  Musculoskeletal:  Positive for back pain.  Skin:  Negative for rash.  Psychiatric/Behavioral:  Negative for hallucinations and suicidal ideas.    Blood pressure 139/89, pulse 93, temperature 97.6 F (36.4 C), temperature source Oral, resp. rate 20, height 5\' 10"  (1.778 m), weight 81.2 kg, SpO2 99%. Body mass index is 25.68 kg/m.   Treatment Plan Summary: Daily contact with patient to assess and evaluate symptoms and progress in treatment and Medication management   ASSESSMENT:   Diagnoses / Active Problems: Principal Problem: MDD (major depressive disorder), recurrent severe, without psychosis (HCC) Diagnosis: Principal Problem:   MDD (major depressive disorder), recurrent severe, without psychosis (HCC) Active Problems:   Moderate cocaine use disorder (HCC)   Alcohol dependence (HCC) constipation     PLAN: Safety and Monitoring:             -- Voluntary admission to inpatient psychiatric unit for safety, stabilization and treatment             -- Daily contact with patient to assess and evaluate  symptoms and progress in treatment             -- Patient's case to be discussed in multi-disciplinary team meeting             -- Observation Level : q15 minute checks             -- Vital signs:  q12 hours             -- Precautions: suicide, elopement, and assault   Medications:              Increase Prozac 40 mg daily for depression and long-term treatment of anxiety             continue risperidone 1 mg at bedtime to help with mood and psychosis and monitor effects and safety and titrate dosing accordingly             Continue gabapentin 200 mg 3 times  daily for alcohol dependence and craving as well as chronic pain, also will help with mood and anxiety             Stopped trazodone, trial of remeron. Trazodone may cause headaches. Also added melatonin             Trial of one time dose of imitrex 25mg  for headache on 10/8 was not helpful              One-time dose of dulcolax suppository 10mg  on 11/17/22, no result                         -read for the [facility] on 11/18/22 is pending - need to rule out obstruction             continue colace 100mg  PO daily for constipation             Stopped vistaril due to significant constipations. Will try using gabapentin 100mg  PO TID PRN anxiety             Mag citrate x 1 on 10/11. Miralax 17g PO four times daily          Continue Ativan taper for alcohol withdrawals and continue to monitor CIWA score             CIWA protocol for alcohol withdrawals and follow-up            Continue pantoprazole 40 mg daily, home medication for GERD           Continue Norvasc 10 mg daily for hypertension, home medication             Continue Keflex 500 mg twice daily for 7 days for finger infection, will monitor                             3. Medical: BLE edema: patient states this is new. Has had blood clot before and on thinners previously, but they were stopped as outpatient for ??? Reason. Have asked for internal medicine to consult for clarity  4. Labs Reviewed: n/a      Lab ordered: n/a   5. Tobacco Use Disorder             -- Patient quit smoking 2 weeks ago, declines NicoDerm patch at this time, will follow             -- Smoking cessation encouraged   6. Group and Therapy: --  Encouraged patient to participate in unit milieu and in scheduled group therapies              --Substance Use counseling: Patient was counseled regarding need to abstain completely from alcohol marijuana and cocaine after discharge.   Patient is interested to go to inpatient residential rehab for substance use rehab after discharge  if that is not successful he is in agreement with intensive outpatient rehab treatment, will follow.   7. Discharge Planning:              -- Social work and case management to assist with discharge planning and identification of hospital follow-up needs prior to discharge             -- Estimated LOS: 5-7 days             -- Discharge Concerns: Need to establish a safety plan; Medication compliance and effectiveness             -- Discharge Goals: Return home with outpatient referrals for mental health follow-up including medication management/psychotherapy     The patient is agreeable with the medication plan, as above. We will monitor the patient's response to pharmacologic treatment, and adjust medications as necessary. Patient is encouraged to participate in group therapy while admitted to the psychiatric unit. We will address other chronic and acute stressors, which contributed to the patient's worsening anxiety and depression, SI, in order to reduce the risk of self-harm at discharge.     Physician Treatment Plan for Primary Diagnosis: MDD (major depressive disorder), recurrent severe, without psychosis (HCC) Long Term Goal(s): Improvement in symptoms so as ready for discharge   Short Term Goals: Ability to identify changes in lifestyle to reduce recurrence of condition will improve, Ability to verbalize feelings will improve, Ability to disclose and discuss suicidal ideas, Ability to demonstrate self-control will improve, and Ability to identify and develop effective coping behaviors will improve     I certify that inpatient services furnished can reasonably be expected to improve the patient's condition.    Roselle Locus, MD 11/21/2022, 2:34 PM

## 2022-11-21 NOTE — Group Note (Signed)
Date:  11/21/2022 Time:  11:54 AM  Group Topic/Focus:  Wellness Toolbox:   The focus of this group is to discuss various aspects of wellness, balancing those aspects and exploring ways to increase the ability to experience wellness.  Patients will create a wellness toolbox for use upon discharge.    Participation Level:  Active  Participation Quality:  Appropriate  Affect:  Appropriate  Cognitive:  Appropriate  Insight: Appropriate  Engagement in Group:  Engaged  Modes of Intervention:  Exploration  Additional Comments:     Reymundo Poll 11/21/2022, 11:54 AM
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
## 2022-11-21 NOTE — [facility] Group Notes (Signed)
[facility] Group Notes:  (Nursing/MHT/Case Management/Adjunct)  Date:  11/21/2022  Time:  9:39 PM  Type of Therapy:   Wrap-up group  Participation Level:  Did Not Attend  Participation Quality:    Affect:    Cognitive:    Insight:    Engagement in Group:    Modes of Intervention:    Summary of Progress/Problems: Pt choose not to attend group because of to many people in room.   Noah Delaine 11/21/2022, 9:39 PM
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
## 2022-11-21 NOTE — Group Note (Signed)
Date:  11/21/2022 Time:  10:56 AM  Group Topic/Focus:  Goals Group:   The focus of this group is to help patients establish daily goals to achieve during treatment and discuss how the patient can incorporate goal setting into their daily lives to aide in recovery.    Participation Level:  Active  Participation Quality:  Appropriate  Affect:  Appropriate  Cognitive:  Appropriate  Insight: Appropriate  Engagement in Group:  Engaged  Modes of Intervention:  Discussion  Additional Comments:     Reymundo Poll 11/21/2022, 10:56 AM

## 2022-11-22 ENCOUNTER — Encounter (HOSPITAL_COMMUNITY): Payer: Self-pay

## 2022-11-22 ENCOUNTER — Other Ambulatory Visit: Payer: Self-pay

## 2022-11-22 ENCOUNTER — Emergency Department (HOSPITAL_COMMUNITY)
Admission: EM | Admit: 2022-11-22 | Discharge: 2022-11-23 | Disposition: A | Discharge status: Other Acute Inpatient Hospital | Payer: Medicaid Other | Attending: Emergency Medicine | Admitting: Emergency Medicine

## 2022-11-22 DIAGNOSIS — F17210 Nicotine dependence, cigarettes, uncomplicated: Secondary | ICD-10-CM | POA: Insufficient documentation

## 2022-11-22 DIAGNOSIS — K4091 Unilateral inguinal hernia, without obstruction or gangrene, recurrent: Secondary | ICD-10-CM | POA: Diagnosis not present

## 2022-11-22 DIAGNOSIS — K4090 Unilateral inguinal hernia, without obstruction or gangrene, not specified as recurrent: Secondary | ICD-10-CM | POA: Diagnosis not present

## 2022-11-22 DIAGNOSIS — R9431 Abnormal electrocardiogram [ECG] [EKG]: Secondary | ICD-10-CM | POA: Diagnosis not present

## 2022-11-22 DIAGNOSIS — F1010 Alcohol abuse, uncomplicated: Secondary | ICD-10-CM | POA: Diagnosis not present

## 2022-11-22 DIAGNOSIS — R45851 Suicidal ideations: Secondary | ICD-10-CM | POA: Diagnosis not present

## 2022-11-22 DIAGNOSIS — I10 Essential (primary) hypertension: Secondary | ICD-10-CM | POA: Diagnosis not present

## 2022-11-22 DIAGNOSIS — R519 Headache, unspecified: Secondary | ICD-10-CM | POA: Diagnosis not present

## 2022-11-22 DIAGNOSIS — F332 Major depressive disorder, recurrent severe without psychotic features: Secondary | ICD-10-CM | POA: Diagnosis not present

## 2022-11-22 DIAGNOSIS — F333 Major depressive disorder, recurrent, severe with psychotic symptoms: Secondary | ICD-10-CM | POA: Insufficient documentation

## 2022-11-22 LAB — COMPREHENSIVE METABOLIC PANEL
ALT: 58 U/L — ABNORMAL HIGH (ref 0–44)
AST: 37 U/L (ref 15–41)
Albumin: 3.8 g/dL (ref 3.5–5.0)
Alkaline Phosphatase: 73 U/L (ref 38–126)
Anion gap: 8 (ref 5–15)
BUN: 17 mg/dL (ref 6–20)
CO2: 24 mmol/L (ref 22–32)
Calcium: 9.1 mg/dL (ref 8.9–10.3)
Chloride: 104 mmol/L (ref 98–111)
Creatinine, Ser: 0.99 mg/dL (ref 0.61–1.24)
GFR, Estimated: >60 mL/min (ref >60)
Glucose, Bld: 105 mg/dL — ABNORMAL HIGH (ref 70–99)
Potassium: 3.9 mmol/L (ref 3.5–5.1)
Sodium: 136 mmol/L (ref 135–145)
Total Bilirubin: 0.4 mg/dL (ref 0.3–1.2)
Total Protein: 6.8 g/dL (ref 6.5–8.1)

## 2022-11-22 LAB — SALICYLATE LEVEL: Salicylate Lvl: <7 mg/dL — ABNORMAL LOW (ref 7.0–30.0)

## 2022-11-22 LAB — ETHANOL: Alcohol, Ethyl (B): <10 mg/dL (ref <10)

## 2022-11-22 LAB — CBC
HCT: 40.8 % (ref 39.0–52.0)
Hemoglobin: 13.7 g/dL (ref 13.0–17.0)
MCH: 31.8 pg (ref 26.0–34.0)
MCHC: 33.6 g/dL (ref 30.0–36.0)
MCV: 94.7 fL (ref 80.0–100.0)
Platelets: 328 10*3/uL (ref 150–400)
RBC: 4.31 MIL/uL (ref 4.22–5.81)
RDW: 13.6 % (ref 11.5–15.5)
WBC: 9.2 10*3/uL (ref 4.0–10.5)
nRBC: 0 % (ref 0.0–0.2)

## 2022-11-22 LAB — RAPID URINE DRUG SCREEN, HOSP PERFORMED
Amphetamines: NOT DETECTED
Barbiturates: NOT DETECTED
Benzodiazepines: NOT DETECTED
Cocaine: NOT DETECTED
Opiates: NOT DETECTED
Tetrahydrocannabinol: NOT DETECTED

## 2022-11-22 LAB — ACETAMINOPHEN LEVEL: Acetaminophen (Tylenol), Serum: <10 ug/mL — ABNORMAL LOW (ref 10–30)

## 2022-11-22 MED ORDER — MIRTAZAPINE 15 MG PO TABS
7.5000 mg | ORAL_TABLET | Freq: Every day | ORAL | Status: DC
  Administered 2022-11-23: 7.5 mg via ORAL
  Filled 2022-11-22: qty 1

## 2022-11-22 MED ORDER — IBUPROFEN 800 MG PO TABS
800.0000 mg | ORAL_TABLET | Freq: Once | ORAL | Status: AC
  Administered 2022-11-22: 800 mg via ORAL
  Filled 2022-11-22: qty 1

## 2022-11-22 MED ORDER — OXYCODONE HCL 5 MG PO TABS
5.0000 mg | ORAL_TABLET | Freq: Once | ORAL | Status: AC
  Administered 2022-11-22: 5 mg via ORAL
  Filled 2022-11-22: qty 1

## 2022-11-22 MED ORDER — PANTOPRAZOLE SODIUM 40 MG PO TBEC: 40.0000 mg | DELAYED_RELEASE_TABLET | Freq: Every day | ORAL | Status: DC

## 2022-11-22 MED ORDER — RISPERIDONE 1 MG PO TABS
1.0000 mg | ORAL_TABLET | Freq: Every day | ORAL | Status: DC
  Administered 2022-11-23: 1 mg via ORAL
  Filled 2022-11-22: qty 1

## 2022-11-22 MED ORDER — ACETAMINOPHEN 325 MG PO TABS
650.0000 mg | ORAL_TABLET | Freq: Once | ORAL | Status: AC
  Administered 2022-11-22: 650 mg via ORAL
  Filled 2022-11-22: qty 2

## 2022-11-22 MED ORDER — MELATONIN 5 MG PO TABS: 5.0000 mg | ORAL_TABLET | Freq: Every day | ORAL | Status: DC

## 2022-11-22 MED ORDER — AMLODIPINE BESYLATE 10 MG PO TABS: 10.0000 mg | ORAL_TABLET | Freq: Every day | ORAL | 0 refills | Status: DC

## 2022-11-22 MED ORDER — ACETAMINOPHEN 325 MG PO TABS
650.0000 mg | ORAL_TABLET | Freq: Four times a day (QID) | ORAL | Status: DC | PRN
  Administered 2022-11-23 (×2): 650 mg via ORAL
  Filled 2022-11-22 (×3): qty 2

## 2022-11-22 MED ORDER — DIPHENHYDRAMINE HCL 25 MG PO CAPS
25.0000 mg | ORAL_CAPSULE | Freq: Once | ORAL | Status: AC
  Administered 2022-11-22: 25 mg via ORAL
  Filled 2022-11-22: qty 1

## 2022-11-22 MED ORDER — ATORVASTATIN CALCIUM 20 MG PO TABS: 20.0000 mg | ORAL_TABLET | Freq: Every day | ORAL | 0 refills | Status: DC

## 2022-11-22 MED ORDER — FLUOXETINE HCL 40 MG PO CAPS: 40.0000 mg | ORAL_CAPSULE | Freq: Every day | ORAL | 0 refills | Status: DC

## 2022-11-22 MED ORDER — DOCUSATE SODIUM 100 MG PO CAPS: 100.0000 mg | ORAL_CAPSULE | Freq: Every day | ORAL | 0 refills | Status: DC

## 2022-11-22 MED ORDER — POLYETHYLENE GLYCOL 3350 17 G PO PACK: 17.0000 g | PACK | Freq: Two times a day (BID) | ORAL | Status: DC

## 2022-11-22 MED ORDER — PROMETHAZINE HCL 12.5 MG PO TABS
25.0000 mg | ORAL_TABLET | Freq: Once | ORAL | Status: AC
  Administered 2022-11-22: 25 mg via ORAL
  Filled 2022-11-22: qty 2

## 2022-11-22 MED ORDER — MELATONIN 5 MG PO TABS: 5.0000 mg | ORAL_TABLET | Freq: Every evening | ORAL | Status: DC | PRN

## 2022-11-22 MED ORDER — MIRTAZAPINE 7.5 MG PO TABS: 7.5000 mg | ORAL_TABLET | Freq: Every evening | ORAL | 0 refills | Status: DC | PRN

## 2022-11-22 MED ORDER — ALBUTEROL SULFATE (2.5 MG/3ML) 0.083% IN NEBU: 2.5000 mg | INHALATION_SOLUTION | Freq: Four times a day (QID) | RESPIRATORY_TRACT | Status: DC | PRN

## 2022-11-22 MED ORDER — ALBUTEROL SULFATE HFA 108 (90 BASE) MCG/ACT IN AERS: 2.0000 | INHALATION_SPRAY | Freq: Four times a day (QID) | RESPIRATORY_TRACT | Status: DC | PRN

## 2022-11-22 MED ORDER — RISPERIDONE 1 MG PO TABS: 1.0000 mg | ORAL_TABLET | Freq: Every day | ORAL | 0 refills | Status: DC

## 2022-11-22 MED ORDER — LINACLOTIDE 290 MCG PO CAPS: 290.0000 ug | ORAL_CAPSULE | Freq: Every day | ORAL | 0 refills | Status: DC

## 2022-11-22 MED ORDER — FLUOXETINE HCL 20 MG PO CAPS
40.0000 mg | ORAL_CAPSULE | Freq: Every day | ORAL | Status: DC
  Administered 2022-11-23: 40 mg via ORAL
  Filled 2022-11-22: qty 2

## 2022-11-22 NOTE — Plan of Care (Signed)
Nurse discussed coping skills with patient.

## 2022-11-22 NOTE — ED Notes (Signed)
Patient went in TTS

## 2022-11-22 NOTE — [facility] Suicide Risk Assessment (Signed)
[facility] Discharge Suicide Risk Assessment   Principal Problem: MDD (major depressive disorder), recurrent severe, without psychosis (HCC) Discharge Diagnoses: Principal Problem:   MDD (major depressive disorder), recurrent severe, without psychosis (HCC) Active Problems:   Moderate cocaine use disorder (HCC)   Alcohol dependence (HCC)   Constipation   [name] is a 57 y.o., male with a past psychiatric history significant for alcohol use disorder severe dependence, cocaine use disorder who presents to the [facility] from [facility] emergency department for evaluation and management of worsening depression, SI, status post suicide attempt by overdose on 10 tablets of blood pressure medication.    During the patient's hospitalization, patient had extensive initial psychiatric evaluation, and follow-up psychiatric evaluations every day.   Psychiatric diagnoses provided upon initial assessment:    MDD (major depressive disorder), recurrent severe, without psychosis (HCC)   Moderate cocaine use disorder (HCC)   Alcohol dependence (HCC)   Patient's psychiatric medications were adjusted on admission:  Started Prozac 10 mg daily then titrated to 20 mg daily for depression and long-term treatment of anxiety             Start gabapentin 100 mg 3 times daily for alcohol dependence and craving as well as chronic pain, also will help with mood and anxiety             Started trazodone 50 mg at bedtime as needed for sleep             Started Vistaril 25 mg 3 times daily as needed for anxiety             Ativan taper for alcohol withdrawals             CIWA protocol for alcohol withdrawals and follow-up             Restart pantoprazole 40 mg daily, home medication for GERD             Restart Norvasc 10 mg daily for hypertension, home medication   During the hospitalization, other adjustments were made to the patient's psychiatric medication regimen:  Prozac was incr to 40 mg every  day  Gabapentin was titrated, but was dc upon discharge due to swelling  Risperdal was started for mood stabilization and psychotic symptoms     Psychiatric Specialty Exam  Presentation  General Appearance:  Casual; Fairly Groomed  Eye Contact: Fair  Speech: Normal Rate  Speech Volume: Normal  Handedness: Right   Mood and Affect  Mood: Anxious  Duration of Depression Symptoms: No data recorded Affect: Appropriate; Congruent; Restricted   Thought Process  Thought Processes: Linear  Descriptions of Associations:Intact  Orientation:Full (Time, Place and Person)  Thought Content:Logical  History of Schizophrenia/Schizoaffective disorder:No data recorded Duration of Psychotic Symptoms:No data recorded Hallucinations:Hallucinations: None  Ideas of Reference:None  Suicidal Thoughts:Suicidal Thoughts: No  Homicidal Thoughts:Homicidal Thoughts: No   Sensorium  Memory: Immediate Good; Recent Good; Remote Good  Judgment: Impaired  Insight: Fair   Art therapist  Concentration: Fair  Attention Span: Fair  Recall: Good  Fund of Knowledge: Good  Language: Good   Psychomotor Activity  Psychomotor Activity: Psychomotor Activity: Normal   Assets  Assets: Communication Skills; Desire for Improvement   Sleep  Sleep: Sleep: Fair   Physical Exam: Physical Exam See discharge summary   ROS See discharge summary   Blood pressure (!) 139/99, pulse 96, temperature 97.8 F (36.6 C), temperature source Oral, resp. rate 20, height 5\' 10"  (1.778 m), weight 81.2  kg, SpO2 98%. Body mass index is 25.68 kg/m.  Mental Status Per Nursing Assessment::   On Admission:  Suicidal ideation indicated by patient  Demographic factors:  Male, Living alone Loss Factors:  Decline in physical health Historical Factors:  Impulsivity Risk Reduction Factors:  Positive social support  Continued Clinical Symptoms:  Mood is stable. Denying any SI or  HI.   Cognitive Features That Contribute To Risk:  none    Suicide Risk:  Mild:  There are no identifiable suicide plans, no associated intent, mild dysphoria and related symptoms, good self-control (both objective and subjective assessment), few other risk factors, and identifiable protective factors, including available and accessible social support.    Follow-up Information     [facility] Follow up on 11/26/2022.   Why: You have a hospital follow up appointment for therapy and medication management services on 11/26/22 at 9:30 am. This appointment will be Virtual via telephone. Contact information: [address]  [address] [place] [address] [phone]                 Plan Of Care/Follow-up recommendations:   -Follow-up with your outpatient psychiatric provider -instructions on appointment date, time, and address (location) are provided to you in discharge paperwork.   -Take your psychiatric medications as prescribed at discharge - instructions are provided to you in the discharge paperwork   -Follow-up with outpatient primary care doctor and other specialists -for management of preventative medicine and chronic medical disease-see above about instructions to f/u with PCP within 48 hours for workup/treatment of swelling.    -If you are prescribed an atypical antipsychotic medication, we recommend that your outpatient psychiatrist follow routine screening for side effects within 3 months of discharge, including monitoring: AIMS scale, height, weight, blood pressure, fasting lipid panel, HbA1c, and fasting blood sugar.    -Recommend total abstinence from alcohol, tobacco, and other illicit drug use at discharge.    -If your psychiatric symptoms recur, worsen, or if you have side effects to your psychiatric medications, call your outpatient psychiatric provider, [phone] or go to the nearest emergency department.   -If suicidal thoughts occur, immediately call your  outpatient psychiatric provider, 911, [phone] or go to the nearest emergency department.     Cristy Hilts, MD 11/22/2022, 10:25 AM

## 2022-11-22 NOTE — BH IP Treatment Plan (Signed)
Interdisciplinary Treatment and Diagnostic Plan Update  11/22/2022 Time of Session: 9:15am (UPDATE) [name] MRN: [id number]  Principal Diagnosis: MDD (major depressive disorder), recurrent severe, without psychosis (HCC)  Secondary Diagnoses: Principal Problem:   MDD (major depressive disorder), recurrent severe, without psychosis (HCC) Active Problems:   Moderate cocaine use disorder (HCC)   Alcohol dependence (HCC)   Constipation   Current Medications:  Current Facility-Administered Medications  Medication Dose Route Frequency Provider Last Rate Last Admin   acetaminophen (TYLENOL) tablet 650 mg  650 mg Oral Q6H PRN Onuoha, Chinwendu V, NP   650 mg at 11/22/22 0734   alum & mag hydroxide-simeth (MAALOX/MYLANTA) 200-200-20 MG/5ML suspension 15 mL  15 mL Oral Q4H PRN Onuoha, Chinwendu V, NP   15 mL at 11/11/22 1154   amLODipine (NORVASC) tablet 10 mg  10 mg Oral Daily Attiah, Nadir, MD   10 mg at 11/22/22 0735   atorvastatin (LIPITOR) tablet 20 mg  20 mg Oral Daily Hill, Shelbie Hutching, MD   20 mg at 11/22/22 6440   docusate sodium (COLACE) capsule 100 mg  100 mg Oral Daily Roselle Locus, MD   100 mg at 11/22/22 0735   FLUoxetine (PROZAC) capsule 40 mg  40 mg Oral Daily Roselle Locus, MD   40 mg at 11/22/22 0736   gabapentin (NEURONTIN) capsule 100 mg  100 mg Oral TID PRN Roselle Locus, MD   100 mg at 11/19/22 1204   gabapentin (NEURONTIN) capsule 200 mg  200 mg Oral TID Abbott Pao, Nadir, MD   200 mg at 11/22/22 0734   ibuprofen (ADVIL) tablet 800 mg  800 mg Oral Q8H PRN Abbott Pao, Nadir, MD   800 mg at 11/22/22 0235   linaclotide (LINZESS) capsule 290 mcg  290 mcg Oral QAC breakfast Roselle Locus, MD   290 mcg at 11/22/22 3474   LORazepam (ATIVAN) tablet 2 mg  2 mg Oral TID PRN Onuoha, Chinwendu V, NP   2 mg at 11/21/22 1511   Or   LORazepam (ATIVAN) injection 2 mg  2 mg Intramuscular TID PRN Onuoha, Chinwendu V, NP       magnesium hydroxide (MILK OF  MAGNESIA) suspension 15 mL  15 mL Oral Daily PRN Onuoha, Chinwendu V, NP   15 mL at 11/16/22 1821   melatonin tablet 5 mg  5 mg Oral QHS Hill, Shelbie Hutching, MD   5 mg at 11/22/22 0236   mirtazapine (REMERON) tablet 7.5 mg  7.5 mg Oral QHS PRN Roselle Locus, MD   7.5 mg at 11/22/22 0236   multivitamin with minerals tablet 1 tablet  1 tablet Oral Daily Onuoha, Chinwendu V, NP   1 tablet at 11/22/22 0733   pantoprazole (PROTONIX) EC tablet 40 mg  40 mg Oral Daily Attiah, Nadir, MD   40 mg at 11/22/22 0734   polyethylene glycol (MIRALAX / GLYCOLAX) packet 17 g  17 g Oral BID Massengill, Harrold Donath, MD   17 g at 11/22/22 0738   risperiDONE (RISPERDAL) tablet 1 mg  1 mg Oral QHS Attiah, Nadir, MD   1 mg at 11/22/22 0235   thiamine (Vitamin B-1) tablet 100 mg  100 mg Oral Daily Onuoha, Chinwendu V, NP   100 mg at 11/22/22 0735   PTA Medications: Medications Prior to Admission  Medication Sig Dispense Refill Last Dose   acetaminophen (TYLENOL) 325 MG tablet Take 650 mg by mouth every 6 (six) hours as needed for moderate pain.      amLODipine (  NORVASC) 10 MG tablet Take 1 tablet (10 mg total) by mouth daily. 90 tablet 1    azithromycin (ZITHROMAX) 250 MG tablet Take 250 mg by mouth daily.      cefdinir (OMNICEF) 300 MG capsule Take 300 mg by mouth 2 (two) times daily.      Cyanocobalamin (VITAMIN B 12 PO) Take 1 tablet by mouth daily.      ipratropium-albuterol (DUONEB) 0.5-2.5 (3) MG/3ML SOLN Take 3 mLs by nebulization every 4 (four) hours as needed (wheezing, shortness of breath).      olmesartan (BENICAR) 20 MG tablet Take 1 tablet (20 mg total) by mouth daily. 90 tablet 1    pantoprazole (PROTONIX) 40 MG tablet Take 1 tablet (40 mg total) by mouth daily at 12 noon. 90 tablet 1    predniSONE (STERAPRED UNI-PAK 21 TAB) 5 MG (21) TBPK tablet Take 5 mg by mouth as directed.      VENTOLIN HFA 108 (90 Base) MCG/ACT inhaler Inhale 2 puffs into the lungs every 6 (six) hours as needed for wheezing or  shortness of breath.       Patient Stressors: Substance abuse   Traumatic event    Patient Strengths: Supportive family/friends   Treatment Modalities: Medication Management, Group therapy, Case management,  1 to 1 session with clinician, Psychoeducation, Recreational therapy.   Physician Treatment Plan for Primary Diagnosis: MDD (major depressive disorder), recurrent severe, without psychosis (HCC) Long Term Goal(s): Improvement in symptoms so as ready for discharge   Short Term Goals: Ability to identify changes in lifestyle to reduce recurrence of condition will improve Ability to verbalize feelings will improve Ability to disclose and discuss suicidal ideas Ability to demonstrate self-control will improve Ability to identify and develop effective coping behaviors will improve  Medication Management: Evaluate patient's response, side effects, and tolerance of medication regimen.  Therapeutic Interventions: 1 to 1 sessions, Unit Group sessions and Medication administration.  Evaluation of Outcomes: Progressing  Physician Treatment Plan for Secondary Diagnosis: Principal Problem:   MDD (major depressive disorder), recurrent severe, without psychosis (HCC) Active Problems:   Moderate cocaine use disorder (HCC)   Alcohol dependence (HCC)   Constipation  Long Term Goal(s): Improvement in symptoms so as ready for discharge   Short Term Goals: Ability to identify changes in lifestyle to reduce recurrence of condition will improve Ability to verbalize feelings will improve Ability to disclose and discuss suicidal ideas Ability to demonstrate self-control will improve Ability to identify and develop effective coping behaviors will improve     Medication Management: Evaluate patient's response, side effects, and tolerance of medication regimen.  Therapeutic Interventions: 1 to 1 sessions, Unit Group sessions and Medication administration.  Evaluation of Outcomes:  Progressing   RN Treatment Plan for Primary Diagnosis: MDD (major depressive disorder), recurrent severe, without psychosis (HCC) Long Term Goal(s): Knowledge of disease and therapeutic regimen to maintain health will improve  Short Term Goals: Ability to remain free from injury will improve, Ability to verbalize frustration and anger appropriately will improve, Ability to participate in decision making will improve, Ability to verbalize feelings will improve, Ability to identify and develop effective coping behaviors will improve, and Compliance with prescribed medications will improve  Medication Management: RN will administer medications as ordered by provider, will assess and evaluate patient's response and provide education to patient for prescribed medication. RN will report any adverse and/or side effects to prescribing provider.  Therapeutic Interventions: 1 on 1 counseling sessions, Psychoeducation, Medication administration, Evaluate responses to treatment, Monitor  vital signs and CBGs as ordered, Perform/monitor CIWA, COWS, AIMS and Fall Risk screenings as ordered, Perform wound care treatments as ordered.  Evaluation of Outcomes: Progressing   LCSW Treatment Plan for Primary Diagnosis: MDD (major depressive disorder), recurrent severe, without psychosis (HCC) Long Term Goal(s): Safe transition to appropriate next level of care at discharge, Engage patient in therapeutic group addressing interpersonal concerns.  Short Term Goals: Engage patient in aftercare planning with referrals and resources, Increase social support, Increase emotional regulation, Facilitate acceptance of mental health diagnosis and concerns, Identify triggers associated with mental health/substance abuse issues, and Increase skills for wellness and recovery  Therapeutic Interventions: Assess for all discharge needs, 1 to 1 time with Social worker, Explore available resources and support systems, Assess for adequacy  in community support network, Educate family and significant other(s) on suicide prevention, Complete Psychosocial Assessment, Interpersonal group therapy.  Evaluation of Outcomes: Progressing   Progress in Treatment: Attending groups: Yes. Participating in groups: Yes. Taking medication as prescribed: Yes. Toleration medication: Yes. Family/Significant other contact made: Yes, individual(s) contacted:  Herby Abraham  Patient understands diagnosis: Yes. Discussing patient identified problems/goals with staff: Yes. Medical problems stabilized or resolved: Yes. Denies suicidal/homicidal ideation: Yes. Issues/concerns per patient self-inventory: Yes.   New problem(s) identified: No, Describe:  None Reported   New Short Term/Long Term Goal(s): medication stabilization, elimination of SI thoughts, development of comprehensive mental wellness plan.    Patient Goals:  Coping Skills   Discharge Plan or Barriers: . CSW will continue to follow and assess for appropriate referrals and possible discharge planning.    Reason for Continuation of Hospitalization: Anxiety Depression Medical Issues Medication stabilization Suicidal ideation Withdrawal symptoms   Estimated Length of Stay: 5-7 Days  Last 3 [place] Suicide Severity Risk Score: Flowsheet Row [facility] (Current[facility] from 11/10/2022 in [facility] ED from 04/29/2022 in [facility] Emergency Department at [facility]  C-SSRS RISK CATEGORY High Risk High Risk No Risk       Last [facility] 2/9 Scores:    11/10/2022    2:17 PM 08/18/2022    8:25 AM 08/16/2022    2:36 PM  Depression screen PHQ 2/9  Decreased Interest 3 0 0  Down, Depressed, Hopeless 3 1 0  PHQ - 2 Score 6 1 0  Altered sleeping  1   Tired, decreased energy  1   Change in appetite  0   Feeling bad or failure about yourself   0   Trouble concentrating  0    Moving slowly or fidgety/restless  0   Suicidal thoughts  0   PHQ-9 Score  3   Difficult doing work/chores  Somewhat difficult     Scribe for Treatment Team: Izell Concordia, LCSW 11/22/2022 9:40 AM

## 2022-11-22 NOTE — Group Note (Signed)
Recreation Therapy Group Note   Group Topic:Stress Management  Group Date: 11/22/2022 Start Time: 0935 End Time: 1001 Facilitators: Arnecia Ector-McCall, LRT,CTRS Location: 300 Hall Dayroom   Group Topic: Stress Management  Goal Area(s) Addresses:  Patient will identify positive stress management techniques. Patient will identify benefits of using stress management post d/c.  Group Description: Meditation. LRT played a meditation that focused on being present, living in gratitude and bringing joy into your day.     Education: Stress Management, Discharge Planning.   Education Outcome: Acknowledges Education   Affect/Mood: Appropriate   Participation Level: None   Participation Quality: N/A   Behavior: N/A   Speech/Thought Process: N/A   Insight: N/A   Judgement: N/A   Modes of Intervention: Meditation   Patient Response to Interventions:  N/A   Education Outcome:  In group clarification offered    Clinical Observations/Individualized Feedback: Pt was in group long enough to get the instructions before leaving.     Plan: Continue to engage patient in RT group sessions 2-3x/week.   Raini Tiley-McCall, LRT,CTRS 11/22/2022 12:21 PM

## 2022-11-22 NOTE — ED Notes (Addendum)
Pt given blanket to cover his head, in attempt to rest despite the noise coming from rm 15

## 2022-11-22 NOTE — BH Assessment (Addendum)
Clinician called [facility] to refer patient for his teleassessment.  Lauren there said that patient can be seen by NP Forcey at 22:00.  Message relayed to paramedic Christian via secure messaging.

## 2022-11-22 NOTE — Progress Notes (Signed)
   11/22/22 0530  15 Minute Checks  Location Bedroom  Visual Appearance Calm  Behavior Sleeping  Sleep (Behavioral Health Patients Only)  Calculate sleep? (Click Yes once per 24 hr at 0600 safety check) Yes  Documented sleep last 24 hours 8

## 2022-11-22 NOTE — ED Notes (Signed)
Pt belongings locked up in LOCKER 7  Jeans  Shirt  Wallet Comb  Socks Shoes  [place]  And prescription meds

## 2022-11-22 NOTE — Plan of Care (Signed)
Problem: Activity: Goal: Sleeping patterns will improve Outcome: Progressing   Problem: Coping: Goal: Ability to verbalize frustrations and anger appropriately will improve Outcome: Not Progressing

## 2022-11-22 NOTE — Group Note (Signed)
Date:  11/22/2022 Time:  11:59 AM  Group Topic/Focus:  Dimensions of Wellness:   The focus of this group is to introduce the topic of wellness and discuss the role each dimension of wellness plays in total health.    Participation Level:  Did Not Attend  Participation Quality:      Affect:      Cognitive:      Insight: None  Engagement in Group:      Modes of Intervention:      Additional Comments:    Beckie Busing 11/22/2022, 11:59 AM
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
## 2022-11-22 NOTE — ED Provider Notes (Signed)
[place] [facility] DEPARTMENT AT [facility] Provider Note  CSN: [id number] Arrival date & time: 11/22/22 1234  Chief Complaint(s) Suicidal  HPI [name] is a 57 y.o. male with past medical history as below, significant for emphysema, hiatal hernia, inguinal hernia, CVA, MDD, etoh abuse, cocaine abuse who presents to the ED with complaint of suicidal  Patient here with ongoing suicidal ideation, reports he was recently discharged from [facility] but was not ready to go home at that point.  Report here today for ongoing suicidal ideation.  At 1 point he mentioned a plan to take various pills to harm self but then upon my assessment patient reports that he has a plan will not come what it is.  Reports that "it wouldn't be much of a plan if I told you."  No hallucinations or delusions.  Denies recent alcohol or illicit drug use.  He has some pain to his right inguinal region consistent with known hernia.  Worsen when he sits forward or lifts heavy objects.  Hernia reduces easily at home.  He is pending evaluation by surgery for this.  Also has a headache, reports chronic headaches since prior stroke.  Similar to prior headaches.  No neuro changes.  No HI  Past Medical History Past Medical History:  Diagnosis Date   Acid reflux    Emphysema of lung (HCC)    Head injury    Hepatitis C    S/p treatment with Epclusa in 2020   Hiatal hernia    HTN (hypertension)    Pulmonary nodules    Stroke [facility])    Patient Active Problem List   Diagnosis Date Noted   Constipation 11/18/2022   Suicidal ideation 11/11/2022   Severe episode of recurrent major depressive disorder, without psychotic features (HCC) 11/11/2022   Alcohol abuse 11/11/2022   Moderate cocaine use disorder (HCC) 11/11/2022   MDD (major depressive disorder), recurrent severe, without psychosis (HCC) 11/11/2022   Alcohol dependence (HCC) 11/11/2022   History of colonic polyps 09/13/2022   Unilateral inguinal hernia  without obstruction or gangrene 09/13/2022   Erectile dysfunction 03/17/2022   Inguinal bulge 03/17/2022   Depression due to old stroke 02/13/2022   Encounter for general adult medical examination with abnormal findings 02/13/2022   Elevated LDL cholesterol level 02/13/2022   Primary hypertension 02/13/2022   Intermittent explosive disorder 11/22/2021   Protein-calorie malnutrition, severe 10/31/2021   Anoxic brain injury (HCC) 10/31/2021   Acute respiratory failure with hypoxia (HCC)    Cerebrovascular accident (CVA) (HCC)    Acute encephalopathy 10/22/2021   Fever    Pleural effusion    Deep venous thrombosis (HCC)    Pain and swelling of left lower leg 12/28/2018   Septic arthritis of shoulder, right (HCC) 09/07/2018   Septic arthritis of sacroiliac joint (HCC) 09/07/2018   Sternoclavicular joint pain, left 09/07/2018   Discitis of lumbosacral region 06/28/2018   Psoas abscess, right (HCC) 06/28/2018   Polysubstance abuse (HCC) 06/24/2018   Normocytic anemia 06/24/2018   Recurrent boils 06/24/2018   Head injury    Septic arthritis of knee, left (HCC)    Acute medial meniscal tear, left, initial encounter    GERD (gastroesophageal reflux disease) 06/22/2018   Mild protein-calorie malnutrition (HCC) 06/22/2018   AKI (acute kidney injury) (HCC) 06/21/2018   Spinal stenosis of lumbosacral region 06/21/2018   Chronic hepatitis C (HCC) 12/17/2013   Cigarette smoker 12/17/2013   Family history of diabetes mellitus (DM) 12/17/2013   Notalgia 12/17/2013  Home Medication(s) Prior to Admission medications   Medication Sig Start Date End Date Taking? Authorizing Provider  ipratropium-albuterol (DUONEB) 0.5-2.5 (3) MG/3ML SOLN Take 3 mLs by nebulization every 4 (four) hours as needed (wheezing, shortness of breath). 10/07/22  Yes [provider]  amLODipine (NORVASC) 10 MG tablet Take 1 tablet (10 mg total) by mouth daily for 14 days. Patient not taking: Reported on  11/22/2022 11/22/22 12/06/22  [name], MD  atorvastatin (LIPITOR) 20 MG tablet Take 1 tablet (20 mg total) by mouth daily for 14 days. Patient not taking: Reported on 11/22/2022 11/23/22 12/07/22  [name], MD  docusate sodium (COLACE) 100 MG capsule Take 1 capsule (100 mg total) by mouth daily. Patient not taking: Reported on 11/22/2022 11/23/22   [name], MD  FLUoxetine (PROZAC) 40 MG capsule Take 1 capsule (40 mg total) by mouth daily for 14 days. Patient not taking: Reported on 11/22/2022 11/23/22 12/07/22  [name], MD  linaclotide [facility]) 290 MCG CAPS capsule Take 1 capsule (290 mcg total) by mouth daily before breakfast for 14 days. Patient not taking: Reported on 11/22/2022 11/23/22 12/07/22  [name], MD  melatonin 5 MG TABS Take 1 tablet (5 mg total) by mouth at bedtime. Patient not taking: Reported on 11/22/2022 11/22/22   [name], MD  mirtazapine (REMERON) 7.5 MG tablet Take 1 tablet (7.5 mg total) by mouth at bedtime as needed for up to 14 days (sleep). Patient not taking: Reported on 11/22/2022 11/22/22 12/06/22  [name], MD  pantoprazole (PROTONIX) 40 MG tablet Take 1 tablet (40 mg total) by mouth daily. Patient not taking: Reported on 11/22/2022 11/23/22   [name], MD  polyethylene glycol (MIRALAX / GLYCOLAX) 17 g packet Take 17 g by mouth 2 (two) times daily. Patient not taking: Reported on 11/22/2022 11/22/22   [name], MD  risperiDONE (RISPERDAL) 1 MG tablet Take 1 tablet (1 mg total) by mouth at bedtime for 14 days. Patient not taking: Reported on 11/22/2022 11/22/22 12/06/22  [name], MD  VENTOLIN HFA 108 (90 Base) MCG/ACT inhaler Inhale 2 puffs into the lungs every 6 (six) hours as needed for wheezing or shortness of breath. Patient not taking: Reported on 11/22/2022 10/07/22   [provider]                                                                                                                                     Past Surgical History Past Surgical History:  Procedure Laterality Date   ACROMIO-CLAVICULAR JOINT REPAIR Right 06/28/2018   Procedure: ACROMIO-CLAVICULAR JOINT IRRIGATION AND DEBRIDEMENT;  Surgeon: Cammy Copa, MD;  Location: [facility] OR;  Service: Orthopedics;  Laterality: Right;   FACIAL FRACTURE SURGERY     IRRIGATION AND DEBRIDEMENT SHOULDER Right 06/28/2018   Procedure: IRRIGATION AND DEBRIDEMENT SHOULDER;  Surgeon: Cammy Copa, MD;  Location: [facility] OR;  Service: Orthopedics;  Laterality: Right;   KNEE  ARTHROSCOPY Left 06/23/2018   Procedure: LEFT KNEE ARTHROSCOPY KNEE I&D.;  Surgeon: Cammy Copa, MD;  Location: [facility] OR;  Service: Orthopedics;  Laterality: Left;   Family History Family History  Problem Relation Age of Onset   Diabetes Mother    Hypertension Mother    Stroke Father    Hypertension Father    Colon cancer Neg Hx     Social History Social History   Tobacco Use   Smoking status: Some Days    Current packs/day: 2.00    Average packs/day: 2.0 packs/day for 30.0 years (60.0 ttl pk-yrs)    Types: Cigarettes   Smokeless tobacco: Never   Tobacco comments:    1 cig a day since the last visit.  Vaping Use   Vaping status: Never Used  Substance Use Topics   Alcohol use: Yes    Comment: 6 pack a week; but last drank 08/12/22.   Drug use: Yes    Types: Cocaine    Comment: states its "been a long time"    Allergies Patient has no known allergies.  Review of Systems Review of Systems  Constitutional:  Negative for chills and fever.  Eyes:  Negative for photophobia.  Respiratory:  Negative for shortness of breath.   Gastrointestinal:  Positive for abdominal pain.  Neurological:  Positive for headaches. Negative for weakness and numbness.  Psychiatric/Behavioral:  Positive for suicidal ideas.   All other systems reviewed and are negative.   Physical Exam Vital Signs  I have reviewed  the triage vital signs BP (!) 102/90 (BP Location: Right Arm)   Pulse (!) 104   Temp 97.9 F (36.6 C) (Oral)   Resp 18   Ht 5\' 10"  (1.778 m)   Wt 86.2 kg   SpO2 95%   BMI 27.26 kg/m  Physical Exam Vitals and nursing note reviewed.  Constitutional:      General: He is not in acute distress.    Appearance: Normal appearance. He is well-developed. He is not ill-appearing.  HENT:     Head: Normocephalic and atraumatic.     Right Ear: External ear normal.     Left Ear: External ear normal.     Nose: Nose normal.     Mouth/Throat:     Mouth: Mucous membranes are moist.  Eyes:     General: No scleral icterus.       Right eye: No discharge.        Left eye: No discharge.  Cardiovascular:     Rate and Rhythm: Normal rate.  Pulmonary:     Effort: Pulmonary effort is normal. No respiratory distress.     Breath sounds: No stridor.  Abdominal:     General: Abdomen is flat. There is no distension.     Palpations: Abdomen is soft.     Tenderness: There is no abdominal tenderness. There is no guarding.     Hernia: A hernia is present.     Comments: Easily reducible right inguinal hernia  Musculoskeletal:        General: No deformity.     Cervical back: No rigidity.     Right lower leg: No edema.     Left lower leg: No edema.  Skin:    General: Skin is warm and dry.     Coloration: Skin is not cyanotic, jaundiced or pale.  Neurological:     Mental Status: He is alert and oriented to person, place, and time.     GCS: GCS eye subscore  is 4. GCS verbal subscore is 5. GCS motor subscore is 6.  Psychiatric:        Attention and Perception: Attention normal.        Mood and Affect: Mood is depressed. Affect is tearful.        Speech: Speech normal.        Behavior: Behavior normal. Behavior is cooperative.        Thought Content: Thought content is not paranoid. Thought content includes suicidal ideation. Thought content does not include homicidal ideation. Thought content includes  suicidal plan. Thought content does not include homicidal plan.     ED Results and Treatments Labs (all labs ordered are listed, but only abnormal results are displayed) Labs Reviewed  COMPREHENSIVE METABOLIC PANEL - Abnormal; Notable for the following components:      Result Value   Glucose, Bld 105 (*)    ALT 58 (*)    All other components within normal limits  SALICYLATE LEVEL - Abnormal; Notable for the following components:   Salicylate Lvl <7.0 (*)    All other components within normal limits  ACETAMINOPHEN LEVEL - Abnormal; Notable for the following components:   Acetaminophen (Tylenol), Serum <10 (*)    All other components within normal limits  ETHANOL  CBC  RAPID URINE DRUG SCREEN, HOSP PERFORMED                                                                                                                          Radiology No results found.  Pertinent labs & imaging results that were available during my care of the patient were reviewed by me and considered in my medical decision making (see MDM for details).  Medications Ordered in ED Medications  promethazine (PHENERGAN) tablet 25 mg (25 mg Oral Given 11/22/22 1344)  acetaminophen (TYLENOL) tablet 650 mg (650 mg Oral Given 11/22/22 1344)  ibuprofen (ADVIL) tablet 800 mg (800 mg Oral Given 11/22/22 1344)  diphenhydrAMINE (BENADRYL) capsule 25 mg (25 mg Oral Given 11/22/22 1345)  oxyCODONE (Oxy IR/ROXICODONE) immediate release tablet 5 mg (5 mg Oral Given 11/22/22 1450)                                                                                                                                     Procedures Procedures  (including critical care time)  Medical Decision Making / ED Course  Medical Decision Making:    Jonell Krontz is a 57 y.o. male  with past medical history as below, significant for emphysema, hiatal hernia, inguinal hernia, CVA, MDD, etoh abuse, cocaine abuse who presents to the ED  with complaint of suicidal. The complaint involves an extensive differential diagnosis and also carries with it a high risk of complications and morbidity.  Serious etiology was considered. Ddx includes but is not limited to: Psychiatric disturbance, substance use mental health disturbance, inguinal hernia, incarcerated hernia, etc.  Complete initial physical exam performed, notably the patient  was NAD, reports ongoing suicidality.    Reviewed and confirmed nursing documentation for past medical history, family history, social history.  Vital signs reviewed.        Patient w/ right-sided inguinal hernia, easily reducible.  Recommend outpatient surgery follow-up.  No evidence of incarceration  Will obtain psychiatry screening workup, TTS evaluation  Labs stable  He is medically cleared. Dispo pending TTS               Additional history obtained: -Additional history obtained from na -External records from outside source obtained and reviewed including: Chart review including previous notes, labs, imaging, consultation notes including  Prior admission Primary care documentation    Lab Tests: -I ordered, reviewed, and interpreted labs.   The pertinent results include:   Labs Reviewed  COMPREHENSIVE METABOLIC PANEL - Abnormal; Notable for the following components:      Result Value   Glucose, Bld 105 (*)    ALT 58 (*)    All other components within normal limits  SALICYLATE LEVEL - Abnormal; Notable for the following components:   Salicylate Lvl <7.0 (*)    All other components within normal limits  ACETAMINOPHEN LEVEL - Abnormal; Notable for the following components:   Acetaminophen (Tylenol), Serum <10 (*)    All other components within normal limits  ETHANOL  CBC  RAPID URINE DRUG SCREEN, HOSP PERFORMED    Notable for labs stable  EKG   EKG Interpretation Date/Time:    Ventricular Rate:    PR Interval:    QRS Duration:    QT Interval:    QTC  Calculation:   R Axis:      Text Interpretation:           Imaging Studies ordered: na   Medicines ordered and prescription drug management: Meds ordered this encounter  Medications   promethazine (PHENERGAN) tablet 25 mg   acetaminophen (TYLENOL) tablet 650 mg   ibuprofen (ADVIL) tablet 800 mg   diphenhydrAMINE (BENADRYL) capsule 25 mg   oxyCODONE (Oxy IR/ROXICODONE) immediate release tablet 5 mg    -I have reviewed the patients home medicines and have made adjustments as needed   Consultations Obtained: na   Cardiac Monitoring: Continuous pulse oximetry interpreted by myself, 99% on RA.    Social Determinants of Health:  Diagnosis or treatment significantly limited by social determinants of health: current smoker and polysubstance abuse Counseled patient for approximately 3 minutes regarding smoking cessation. Discussed risks of smoking and how they applied and affected their visit here today. Patient not ready to quit at this time, however will follow up with their primary doctor when they are.   CPT code: 40981: intermediate counseling for smoking cessation     Reevaluation: After the interventions noted above, I reevaluated the patient and found that they have improved  Co morbidities that complicate the patient evaluation  Past Medical History:  Diagnosis Date   Acid reflux  Emphysema of lung (HCC)    Head injury    Hepatitis C    S/p treatment with Epclusa in 2020   Hiatal hernia    HTN (hypertension)    Pulmonary nodules    Stroke [facility])       Dispostion: Disposition decision including need for hospitalization was considered, and patient disposition pending at time of sign out.    Final Clinical Impression(s) / ED Diagnoses Final diagnoses:  Suicidal ideation  Unilateral recurrent inguinal hernia without obstruction or gangrene        Sloan Leiter, DO 11/22/22 1603

## 2022-11-22 NOTE — ED Notes (Addendum)
Pt increased headache and depressed mood to do pt in rm 15 constantly yelling despite trying to redirect her Staff have asked for earplugs for pt.
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
## 2022-11-22 NOTE — Progress Notes (Signed)
Discharge Note:  Patient discharged, stated he was going to ER.  Suicide prevention information given and discussed with patient who stated he understood and had no questions.  Patient denied SI and HI.  Denied A/V hallucinations.  Patient stated he received all his belongings, clothing, toiletries, misc items,  Patient stated he appreciated all assistance received from [facility] staff.  All required discharge information given.
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
## 2022-11-22 NOTE — Progress Notes (Signed)
  [facility] Adult Case Management Discharge Plan :  Will you be returning to the same living situation after discharge: No  At discharge, do you have transportation home?: Yes,  A taxi from [employer] will be provided to pt Do you have the ability to pay for your medications: Yes,  medicaid   Release of information consent forms completed and in the chart;  Patient's signature needed at discharge.  Patient to Follow up at:  Follow-up Information     [facility] Follow up on 11/26/2022.   Why: You have a hospital follow up appointment for therapy and medication management services on 11/26/22 at 9:30 am. This appointment will be Virtual via telephone. Contact information: [address]  [address] [place] [address] [phone]                 Next level of care provider has access to [facility] Link:no  Safety Planning and Suicide Prevention discussed: Yes,  [name]      Has patient been referred to the Quitline?: Patient refused referral for treatment  Patient has been referred for addiction treatment: Patient refused referral for treatment.  Izell Marshall, LCSW 11/22/2022, 10:23 AM

## 2022-11-22 NOTE — ED Provider Notes (Signed)
With known right inguinal hernia.  Patient states is bulging again.  It is easily reducible.  No concerns for incarceration.  Had conversation with patient once he is cleared by behavioral health or discharged by them we can make a referral to general surgery to have elective repair.  Patient nontoxic no acute distress.  Daytime doctor also was able to easily reduce the hernia.   Vanetta Mulders, MD 11/22/22 [phone]

## 2022-11-22 NOTE — ED Triage Notes (Signed)
Pt stated " I still feel like I am suicidal" "I just want to die"  Pt stated that he has been feeling like this for several months  Discharged this morning from the [facility]   Complains of 10/10 RIGHT groin hernia pain   Pt stated that he doesn't have a home That he would either shoot himself or "take a bunch of pills to make me go to sleep" "I ain't trying to hurt anybody else"

## 2022-11-22 NOTE — Discharge Instructions (Addendum)
-  Follow-up with your outpatient psychiatric provider -instructions on appointment date, time, and address (location) are provided to you in discharge paperwork.  -Take your psychiatric medications as prescribed at discharge - instructions are provided to you in the discharge paperwork As we discussed, your prescriptions will be printed at discharge, and you can have theses filled at any pharmacy.   -Follow-up with outpatient primary care doctor and other specialists -for management of preventative medicine and any chronic medical disease. Please follow-up with your PCP within 48 hours of discharge of this hospital about the swelling of your legs. Call the office of [name] after discharge to set up this appointment.   -Recommend abstinence from alcohol, tobacco, and other illicit drug use at discharge.   -If your psychiatric symptoms recur, worsen, or if you have side effects to your psychiatric medications, call your outpatient psychiatric provider, 911, 988 or go to the nearest emergency department.  -If suicidal thoughts occur, call your outpatient psychiatric provider, 911, 988 or go to the nearest emergency department.  Naloxone (Narcan) can help reverse an overdose when given to the victim quickly.  [facility] offers free naloxone kits and instructions/training on its use.  Add naloxone to your first aid kit and you can help save a life.   Pick up your free kit at the following locations:   [place]:  [facility], [address] [place] [address] [phone]) [facility] [address] [place] [address] [phone]) [facility] [address] [place] [address]  [place]: [facility] [address] [place] [address] ([phone]) [facility] [address] [place] [address]  [phone])

## 2022-11-22 NOTE — ED Notes (Addendum)
Pt given meal tray and ginger Pt states unable to eat due to the smell( rm 15 passing gas a lot)

## 2022-11-22 NOTE — Group Note (Signed)
Date:  11/22/2022 Time:  11:53 AM  Group Topic/Focus:  Goals Group:   The focus of this group is to help patients establish daily goals to achieve during treatment and discuss how the patient can incorporate goal setting into their daily lives to aide in recovery.    Participation Level:  Active  Participation Quality:  Appropriate  Affect:  Appropriate  Cognitive:  Alert  Insight: Appropriate  Engagement in Group:  Engaged  Modes of Intervention:  Discussion  Additional Comments:    Beckie Busing 11/22/2022, 11:53 AM

## 2022-11-22 NOTE — Progress Notes (Signed)
Pt woke up around 2:30, given HS medications at that time.  No distress noted, Pt went back to bed after receiving medications.  Will continue to monitor.

## 2022-11-22 NOTE — BHH Counselor (Signed)
11/22/2022  [name] DOB: 04-08-1965 MRN: [id number]   RIDER WAIVER AND RELEASE OF LIABILITY  For the purposes of helping with transportation needs, [name] partners with outside transportation providers (taxi companies, [employer], Catering manager.) to give [employer] patients or other approved people the choice of on-demand rides [employer]") to our buildings for non-emergency visits.  By using [employer], I, the person signing this document, on behalf of myself and/or any legal minors (in my care using the [employer]), agree:  Science writer given to me are supplied by independent, outside transportation providers who do not work for, or have any affiliation with, [employer]. [place] is not a transportation company. [place] has no control over the quality or safety of the rides I get using [employer]. [place] has no control over whether any outside ride will happen on time or not. [place] gives no guarantee on the reliability, quality, safety, or availability on any rides, or that no mistakes will happen. I know and accept that traveling by vehicle (car, truck, SVU, Zenaida Niece, bus, taxi, etc.) has risks of serious injuries such as disability, being paralyzed, and death. I know and agree the risk of using [employer] is mine alone, and not [employer]. Transport Services are provided "as is" and as are available. The transportation providers are in charge for all inspections and care of the vehicles used to provide these rides. I agree not to take legal action against [employer], its agents, employees, officers, directors, representatives, insurers, attorneys, assigns, successors, subsidiaries, and affiliates at any time for any reasons related directly or indirectly to using [employer]. I also agree not to take legal action against [place] or its affiliates for any injury, death, or damage to property caused by or related to using  [employer]. I have read this Waiver and Release of Liability, and I understand the terms used in it and their legal meaning. This Waiver is freely and voluntarily given with the understanding that my right (or any legal minors) to legal action against [place] relating to [employer] is knowingly given up to use these services.   I attest that I read the Ride Waiver and Release of Liability to [name], gave Mr. Kindred the opportunity to ask questions and answered the questions asked (if any). I affirm that Fielding Mault then provided consent for assistance with transportation.

## 2022-11-22 NOTE — Progress Notes (Signed)
Pt meets inpatient BH placement per Norval Morton, NP Telepsychiatry Consult Services. CSW requested [facility] Zachary George to review pt for [facility] placement within [facility]. CSW/ Disposition team will assist and follow with placement.   Maryjean Ka, MSW, [facility] 11/22/2022 11:07 PM

## 2022-11-23 DIAGNOSIS — E785 Hyperlipidemia, unspecified: Secondary | ICD-10-CM | POA: Diagnosis not present

## 2022-11-23 DIAGNOSIS — F419 Anxiety disorder, unspecified: Secondary | ICD-10-CM | POA: Diagnosis not present

## 2022-11-23 DIAGNOSIS — F333 Major depressive disorder, recurrent, severe with psychotic symptoms: Secondary | ICD-10-CM | POA: Diagnosis not present

## 2022-11-23 DIAGNOSIS — G4700 Insomnia, unspecified: Secondary | ICD-10-CM | POA: Diagnosis not present

## 2022-11-23 DIAGNOSIS — K219 Gastro-esophageal reflux disease without esophagitis: Secondary | ICD-10-CM | POA: Diagnosis not present

## 2022-11-23 DIAGNOSIS — F1410 Cocaine abuse, uncomplicated: Secondary | ICD-10-CM | POA: Diagnosis not present

## 2022-11-23 DIAGNOSIS — F332 Major depressive disorder, recurrent severe without psychotic features: Secondary | ICD-10-CM | POA: Diagnosis not present

## 2022-11-23 DIAGNOSIS — K5900 Constipation, unspecified: Secondary | ICD-10-CM | POA: Diagnosis not present

## 2022-11-23 DIAGNOSIS — R45851 Suicidal ideations: Secondary | ICD-10-CM | POA: Diagnosis not present

## 2022-11-23 DIAGNOSIS — I10 Essential (primary) hypertension: Secondary | ICD-10-CM | POA: Diagnosis not present

## 2022-11-23 DIAGNOSIS — Z734 Inadequate social skills, not elsewhere classified: Secondary | ICD-10-CM | POA: Diagnosis not present

## 2022-11-23 DIAGNOSIS — Z5900 Homelessness unspecified: Secondary | ICD-10-CM | POA: Diagnosis not present

## 2022-11-23 DIAGNOSIS — R451 Restlessness and agitation: Secondary | ICD-10-CM | POA: Diagnosis not present

## 2022-11-23 DIAGNOSIS — F1010 Alcohol abuse, uncomplicated: Secondary | ICD-10-CM | POA: Diagnosis not present

## 2022-11-23 MED ORDER — AMLODIPINE BESYLATE 5 MG PO TABS
10.0000 mg | ORAL_TABLET | Freq: Every evening | ORAL | Status: DC
  Administered 2022-11-23: 10 mg via ORAL
  Filled 2022-11-23: qty 2

## 2022-11-23 MED ORDER — ATORVASTATIN CALCIUM 10 MG PO TABS
20.0000 mg | ORAL_TABLET | Freq: Every evening | ORAL | Status: DC
  Administered 2022-11-23: 20 mg via ORAL
  Filled 2022-11-23: qty 2

## 2022-11-23 MED ORDER — LORAZEPAM 0.5 MG PO TABS
0.5000 mg | ORAL_TABLET | Freq: Once | ORAL | Status: AC
  Administered 2022-11-23: 0.5 mg via ORAL
  Filled 2022-11-23: qty 1

## 2022-11-23 NOTE — Progress Notes (Signed)
4:06 PM - CSW received phone call from [place], intake staff, at [facility]. [name] requested that pt be transported today instead of tomorrow. CSW notified NP and RN of new admission date.  Cathie Beams, MSW, LCSW  11/23/2022 4:11 PM

## 2022-11-23 NOTE — ED Provider Notes (Signed)
Patient accepted to [facility].   Pricilla Loveless, MD 11/23/22 [phone]

## 2022-11-23 NOTE — Progress Notes (Addendum)
ADDENDUM  11:34 AM - CSW spoke with Para March, intake coordinator, at [facility] via phone call. Per Para March, pt has been declined due to medcial acuity.  Per Neldon Mc, RN, [facility] is interested in reviewing pt for admission today. CSW sent referral to [facility] intake department. CSW will continue to assist with placement.  Cathie Beams, MSW, LCSW  11/23/2022 11:17 AM

## 2022-11-23 NOTE — ED Provider Notes (Signed)
Alerted by nursing that the patient did not have his BP medications ordered and he was requesting it. Discussed with CPhT about medicaitons. Appears the patient is on amlodipine 10mg  and lipitor as well. I have ordered these medications for him. He reports that he is also feeling mildly anxious, does not appear in acute distress. Will order small dose of PO ativan.    Clifford Rich, PA-C 11/23/22 1543    Pricilla Loveless, MD 11/23/22 [phone]

## 2022-11-23 NOTE — ED Provider Notes (Signed)
Emergency Medicine Observation Re-evaluation Note  [name] is a 57 y.o. male, seen on rounds today.  Pt initially presented to the ED for complaints of Suicidal Currently, the patient is placement.  Physical Exam  BP (!) 102/90 (BP Location: Right Arm)   Pulse (!) 104   Temp 97.9 F (36.6 C) (Oral)   Resp 18   Ht 5\' 10"  (1.778 m)   Wt 86.2 kg   SpO2 95%   BMI 27.26 kg/m  Physical Exam Alert and in no acute distress  ED Course / MDM  EKG:EKG Interpretation Date/Time:  Monday November 22 2022 16:20:07 EDT Ventricular Rate:  86 PR Interval:  153 QRS Duration:  95 QT Interval:  371 QTC Calculation: 444 R Axis:   23  Text Interpretation: Sinus rhythm Normal ECG When compared with ECG of 12/30/2021, T wave abnormality is no longer present Confirmed by Dione Booze ([id number]) on 11/23/2022 3:40:54 AM  I have reviewed the labs performed to date as well as medications administered while in observation.  Recent changes in the last 24 hours include none.  Plan  Current plan is for placement.    Bethann Berkshire, MD 11/23/22 [phone]
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
## 2022-11-23 NOTE — Progress Notes (Signed)
LCSW Progress Note  [id number]   [name]  11/23/2022  12:35 AM    Inpatient Behavioral Health Placement  Pt meets inpatient criteria per Norval Morton, NP Telepsychiatry Consult Services. There are no available beds within [facility] system per [facility] Erica Wright,RN. Referral was sent to the following facilities;   Destination  Service Provider Address Phone [facility]  [address] [place], [place] [address] [phone]  [facility]  [address] [place] [address] [phone]  [name]  [address] [place], [place] [address] [phone]  [facility]  [address], [place] [address] [phone]  [facility]  [address] [place], [place] [address] [phone]  [facility]  [address] [place] [address] [phone]  [facility]  [address] [place] [address] [phone]  [facility]  [address] [place], [place] [address] [phone]  [facility]  [address], [place] [address] [phone]  [facility]  [address] [place] [address] [phone]  [facility]  [address], [place] [address] [phone]  [facility]  [address] [place], [place] [phone]  [facility]  [address] [place] [address] [phone]  [facility]  [address], [place] [address] [phone]  [facility]  [address] [place] [address] [phone]  [facility]  [address], [place] [address] [phone]  [facility]  [address], [place] [address] [phone]  [facility]  [address], [place] [address] [phone]  [facility]  [place] [phone]  [facility]  [address], [place] [address] [phone]  [facility]  [address] [place] [address] [phone]  [facility] [address]  [address], [place] [address] [phone]  [facility]  [address] [place] [address] [phone]    Situation ongoing,  CSW will follow up.    Maryjean Ka, MSW, [facility] 11/23/2022 12:35 AM

## 2022-11-23 NOTE — Progress Notes (Signed)
Pt was accepted to [facility] Wednesday 11/24/2022; Bed Assignment   Pt meets inpatient criteria per Norval Morton, NP Telepsychiatry Consult Services  Attending Physician will be Dr. Phyllis Ginger. [place], MD  Report can be called to: - [phone]  Pt can arrive after:2:00pm  Care Team notified:Christian Lamar Benes, Paramedic, [address] [place], LCSWA 11/23/2022 @ 1:59 AM

## 2022-11-23 NOTE — ED Notes (Signed)
All belongings and paperwork given to [employer].

## 2022-11-24 DIAGNOSIS — F23 Brief psychotic disorder: Secondary | ICD-10-CM | POA: Diagnosis not present

## 2022-11-24 DIAGNOSIS — E782 Mixed hyperlipidemia: Secondary | ICD-10-CM | POA: Diagnosis not present

## 2022-11-24 DIAGNOSIS — F17213 Nicotine dependence, cigarettes, with withdrawal: Secondary | ICD-10-CM | POA: Diagnosis not present

## 2022-11-26 DIAGNOSIS — F333 Major depressive disorder, recurrent, severe with psychotic symptoms: Secondary | ICD-10-CM | POA: Diagnosis not present

## 2022-11-26 DIAGNOSIS — Z79899 Other long term (current) drug therapy: Secondary | ICD-10-CM | POA: Diagnosis not present

## 2022-11-26 DIAGNOSIS — E559 Vitamin D deficiency, unspecified: Secondary | ICD-10-CM | POA: Diagnosis not present

## 2022-12-08 ENCOUNTER — Encounter: Payer: Self-pay | Admitting: *Deleted

## 2022-12-08 NOTE — Telephone Encounter (Signed)
Letter mailed

## 2022-12-10 ENCOUNTER — Other Ambulatory Visit: Payer: Self-pay | Admitting: Obstetrics and Gynecology

## 2022-12-10 DIAGNOSIS — Z419 Encounter for procedure for purposes other than remedying health state, unspecified: Secondary | ICD-10-CM | POA: Diagnosis not present

## 2022-12-10 NOTE — Patient Outreach (Signed)
Medicaid Managed Care   Nurse Care Manager Note  12/10/2022 Name:  [name] MRN:  [id number] DOB:  1965-11-06  [name] is an 57 y.o. year old male who is a primary patient of Clifford Laroche, [name].  The [facility] Managed Care Coordination team was consulted for assistance with:    Chronic healthcare management needs, HTN, tobacco use, h/o CVA, arthritis, anxiety, GERD, emphysema, neuropathy, depression, chronic hepatitis.  [name] was given information about Medicaid Managed Care Coordination team services today. [name] Patient agreed to services and verbal consent obtained.  Engaged with patient by telephone for follow up visit in response to provider referral for case management and/or care coordination services.   Assessments/Interventions:  Review of past medical history, allergies, medications, health status, including review of consultants reports, laboratory and other test data, was performed as part of comprehensive evaluation and provision of chronic care management services.  SDOH (Social Determinants of Health) assessments and interventions performed: SDOH Interventions    Flowsheet Row Patient Outreach Telephone from 12/10/2022 in [place] POPULATION HEALTH DEPARTMENT Patient Outreach Telephone from 09/16/2022 in [place] POPULATION HEALTH DEPARTMENT Patient Outreach Telephone from 08/16/2022 in [place] POPULATION HEALTH DEPARTMENT Patient Outreach Telephone from 06/15/2022 in [place] POPULATION HEALTH DEPARTMENT Erroneous Encounter from 06/01/2022 in [facility] [place] [facility] Patient Outreach Telephone from 05/13/2022 in [place] POPULATION HEALTH DEPARTMENT  SDOH Interventions        Food Insecurity Interventions -- -- Intervention Not Indicated -- Intervention Not Indicated --  Housing Interventions -- -- -- Intervention Not Indicated Intervention Not Indicated --  Transportation Interventions -- -- -- -- Intervention Not Indicated Intervention  Not Indicated  Utilities Interventions -- -- -- -- Intervention Not Indicated Intervention Not Indicated  Alcohol Usage Interventions -- -- -- Intervention Not Indicated (Score <7) -- --  Financial Strain Interventions Intervention Not Indicated -- -- -- Intervention Not Indicated --  Physical Activity Interventions -- -- -- -- Intervention Not Indicated  [patient with right leg weakness and some left leg numbeness-residual effects from stroke] --  Stress Interventions Intervention Not Indicated -- -- -- Intervention Not Indicated --  Social Connections Interventions -- -- -- -- Intervention Not Indicated --  Health Literacy Interventions -- Intervention Not Indicated -- -- -- --     Care Plan No Known Allergies  Medications Reviewed Today     Reviewed by Clifford Chandler, RN (Registered Nurse) on 12/10/22 at 1057  Med List Status: <None>   Medication Order Taking? Sig Documenting Provider Last Dose Status Informant  amLODipine (NORVASC) 10 MG tablet [id number]  Take 1 tablet (10 mg total) by mouth daily for 14 days.  Patient not taking: Reported on 11/22/2022   Clifford Inches, MD  Expired 12/06/22 2359 Self  atorvastatin (LIPITOR) 20 MG tablet [id number]  Take 1 tablet (20 mg total) by mouth daily for 14 days.  Patient not taking: Reported on 11/22/2022   Clifford Inches, MD  Expired 12/07/22 2359 Self  docusate sodium (COLACE) 100 MG capsule [id number]  Take 1 capsule (100 mg total) by mouth daily.  Patient not taking: Reported on 11/22/2022   Clifford Inches, MD  Active Self  FLUoxetine (PROZAC) 40 MG capsule [id number]  Take 1 capsule (40 mg total) by mouth daily for 14 days.  Patient not taking: Reported on 11/22/2022   Clifford Inches, MD  Expired 12/07/22 2359 Self  ipratropium-albuterol (DUONEB) 0.5-2.5 (3) MG/3ML SOLN [id number] No Take 3 mLs by nebulization every 4 (four) hours as  needed (wheezing, shortness of breath).  Patient not taking: Reported on 12/10/2022    [provider] Not Taking Active Self  linaclotide (LINZESS) 290 MCG CAPS capsule [id number]  Take 1 capsule (290 mcg total) by mouth daily before breakfast for 14 days.  Patient not taking: Reported on 11/22/2022   Clifford Inches, MD  Expired 12/07/22 2359 Self  melatonin 5 MG TABS [id number]  Take 1 tablet (5 mg total) by mouth at bedtime.  Patient not taking: Reported on 11/22/2022   Clifford Inches, MD  Active Self  mirtazapine (REMERON) 7.5 MG tablet [id number]  Take 1 tablet (7.5 mg total) by mouth at bedtime as needed for up to 14 days (sleep).  Patient not taking: Reported on 11/22/2022   Clifford Inches, MD  Expired 12/06/22 2359 Self  pantoprazole (PROTONIX) 40 MG tablet [id number]  Take 1 tablet (40 mg total) by mouth daily.  Patient not taking: Reported on 11/22/2022   Clifford Inches, MD  Active Self  polyethylene glycol (MIRALAX / GLYCOLAX) 17 g packet [id number]  Take 17 g by mouth 2 (two) times daily.  Patient not taking: Reported on 11/22/2022   Clifford Inches, MD  Active Self  risperiDONE (RISPERDAL) 1 MG tablet [id number]  Take 1 tablet (1 mg total) by mouth at bedtime for 14 days.  Patient not taking: Reported on 11/22/2022   Clifford Inches, MD  Expired 12/06/22 2359 Self  VENTOLIN HFA 108 (90 Base) MCG/ACT inhaler [id number]  Inhale 2 puffs into the lungs every 6 (six) hours as needed for wheezing or shortness of breath.  Patient not taking: Reported on 11/22/2022   [provider]  Active Self           Med Note Pete Glatter Nov 22, 2022  1:31 PM) Pt needs a refill           Patient Active Problem List   Diagnosis Date Noted   Severe episode of recurrent major depressive disorder, with psychotic features (HCC) 11/23/2022   Constipation 11/18/2022   Suicidal ideation 11/11/2022   Severe episode of recurrent major depressive disorder, without psychotic features (HCC) 11/11/2022   Alcohol abuse 11/11/2022   Moderate  cocaine use disorder (HCC) 11/11/2022   MDD (major depressive disorder), recurrent severe, without psychosis (HCC) 11/11/2022   Alcohol dependence (HCC) 11/11/2022   History of colonic polyps 09/13/2022   Unilateral inguinal hernia without obstruction or gangrene 09/13/2022   Erectile dysfunction 03/17/2022   Inguinal bulge 03/17/2022   Depression due to old stroke 02/13/2022   Encounter for general adult medical examination with abnormal findings 02/13/2022   Elevated LDL cholesterol level 02/13/2022   Primary hypertension 02/13/2022   Intermittent explosive disorder 11/22/2021   Protein-calorie malnutrition, severe 10/31/2021   Anoxic brain injury (HCC) 10/31/2021   Acute respiratory failure with hypoxia (HCC)    Cerebrovascular accident (CVA) (HCC)    Acute encephalopathy 10/22/2021   Fever    Pleural effusion    Deep venous thrombosis (HCC)    Pain and swelling of left lower leg 12/28/2018   Septic arthritis of shoulder, right (HCC) 09/07/2018   Septic arthritis of sacroiliac joint (HCC) 09/07/2018   Sternoclavicular joint pain, left 09/07/2018   Discitis of lumbosacral region 06/28/2018   Psoas abscess, right (HCC) 06/28/2018   Polysubstance abuse (HCC) 06/24/2018   Normocytic anemia 06/24/2018   Recurrent boils 06/24/2018   Head injury    Septic arthritis of knee, left (HCC)    Acute medial meniscal  tear, left, initial encounter    GERD (gastroesophageal reflux disease) 06/22/2018   Mild protein-calorie malnutrition (HCC) 06/22/2018   AKI (acute kidney injury) (HCC) 06/21/2018   Spinal stenosis of lumbosacral region 06/21/2018   Chronic hepatitis C (HCC) 12/17/2013   Cigarette smoker 12/17/2013   Family history of diabetes mellitus (DM) 12/17/2013   Notalgia 12/17/2013   Conditions to be addressed/monitored per PCP order:  Chronic healthcare management needs, HTN, tobacco use, h/o CVA, arthritis, anxiety, GERD, emphysema, neuropathy, depression, chronic  hepatitis.  Care Plan : RN Care Manager Plan of Care  Updates made by Clifford Chandler, RN since 12/10/2022 12:00 AM     Problem: Health Promotion or Disease Self-Management (General Plan of Care)      Long-Range Goal: Chronic Disease Management   Start Date: 12/16/2021  Expected End Date: 03/12/2023  Priority: High  Note:   Current Barriers:  Knowledge Deficits related to plan of care for management of recent ischemic stroke, arthritis, anxiety/depression, tobacco use, GERD, HTN, h/o DVT Chronic Disease Management support and education needs related to recent ischemic stroke, arthritis, anxiety/depression, tobacco use, GERD, HTN, h/o DVT 12/10/22: hosp 10/2-10/14 at [facility] for suicide attempt, 10/14 at [facility] for suicical ideation-offered LCSW services and declined.  Has f/u with PCP 11/12.  States he is okay and safe today.  Would like housing resources and will refer.  Needs to r/s PULM and surgery appt-was in hospital.  Smokes 2ppd-no BP check.  RNCM Clinical Goal(s):  Patient will verbalize understanding of plan for management of ischemic stroke  as evidenced by patient report and follow up  through collaboration with RN Care manager, provider, and care team.   Interventions: Inter-disciplinary care team collaboration (see longitudinal plan of care) Evaluation of current treatment plan related to  self management and patient's adherence to plan as established by provider Patient provided with eye provider information. Collaborated with [name] [name] referral for dental resources, housing resources  Hypertension Interventions:  (Status:  New goal.) Long Term Goal Last practice recorded BP readings:  BP Readings from Last 3 Encounters:        12/29/21 126/88  05/12/22:            137/100 08/18/22:          134/86  Most recent eGFR/CrCl: No results found for: "EGFR"  No components found for: "CRCL"  Evaluation of current treatment plan related to hypertension self management and patient's  adherence to plan as established by provider Reviewed medications with patient and discussed importance of compliance Discussed plans with patient for ongoing care management follow up and provided patient with direct contact information for care management team Reviewed scheduled/upcoming provider appointments including:  Assessed social determinant of health barriers   Stroke:  (Status:New goal.) Long Term Goal Reviewed Importance of taking all medications as prescribed Reviewed Importance of attending all scheduled provider appointments Advised to report any changes in symptoms or exercise tolerance Assessed social determinant of health barriers Assessed for signs and symptoms of stroke Assessed for cognitive impairment Assessed use of tobacco use  Patient Goals/Self-Care Activities: Take all medications as prescribed Attend all scheduled provider appointments Call pharmacy for medication refills 3-7 days in advance of running out of medications Perform all self care activities independently  Perform IADL's (shopping, preparing meals, housekeeping, managing finances) independently Call provider office for new concerns or questions  Patient to follow up with provider regarding Zoloft Patient's DPR to call and schedule CT Pelvis as ordered. Patient to reschedule  dental appt and schedule PCP appt.  Follow Up Plan:  The patient has been provided with contact information for the care management team and has been advised to call with any health related questions or concerns.  The care management team will reach out to the patient again over the next 30 business  days.    Long-Range Goal: Establish Plan of Care for Chronic Disease Managedment Needs   Priority: High  Note:   Timeframe:  Long-Range Goal Priority:  High Start Date:     12/16/21                        Expected End Date:  ongoing                     Follow Up Date: 01/11/23   - schedule appointment for flu shot -  schedule appointment for vaccines needed due to my age or health - schedule recommended health tests  - schedule and keep appointment for annual check-up    Why is this important?   Screening tests can find diseases early when they are easier to treat.  Your doctor or nurse will talk with you about which tests are important for you.  Getting shots for common diseases like the flu and shingles will help prevent them.   12/10/22>  To r/s PULM and surgeon appt-has PCP appt 11/12   Follow Up:  Patient agrees to Care Plan and Follow-up.  Plan: The Managed Medicaid care management team will reach out to the patient again over the next 30 business  days. and The  Patient has been provided with contact information for the Managed Medicaid care management team and has been advised to call with any health related questions or concerns.  Date/time of next scheduled RN care management/care coordination outreach: 01/11/23 at 1230.

## 2022-12-10 NOTE — Patient Instructions (Signed)
Hi [name], thank you for the updates-have a good day!  [name] was given information about Medicaid Managed Care team care coordination services as a part of their [facility] Medicaid benefit. [name] verbally consented to engagement with the [facility] Managed Care team.   If you are experiencing a medical emergency, please call 911 or report to your local emergency department or urgent care.   If you have a non-emergency medical problem during routine business hours, please contact your provider's office and ask to speak with a nurse.   For questions related to your [facility] health plan, please call: [phone] or go here:[name]  If you would like to schedule transportation through your [place] plan, please call the following number at least 2 days in advance of your appointment: [phone].   You can also use the MTM portal or MTM mobile app to manage your rides. Reimbursement for transportation is available through [facility]! For the portal, please go to [email or web address]  Call the [facility] Crisis Line at [phone], at any time, 24 hours a day, 7 days a week. If you are in danger or need immediate medical attention call 911.  If you would like help to quit smoking, call 1-800-QUIT-NOW (215-516-5739) OR Espaol: 1-855-Djelo-Ya (4-332-951-8841) o para ms informacin haga clic aqu or Text READY to 660-630 to register via text  [name] - following are the goals we discussed in your visit today:   Goals Addressed    Timeframe:  Long-Range Goal Priority:  High Start Date:     12/16/21                        Expected End Date:  ongoing                     Follow Up Date: 01/11/23   - schedule appointment for flu shot - schedule appointment for vaccines needed due to my age or health - schedule recommended health tests  - schedule and keep appointment for annual check-up    Why is this important?   Screening tests  can find diseases early when they are easier to treat.  Your doctor or nurse will talk with you about which tests are important for you.  Getting shots for common diseases like the flu and shingles will help prevent them.   12/10/22>  To r/s PULM and surgeon appt-has PCP appt 11/12  Patient verbalizes understanding of instructions and care plan provided today and agrees to view in MyChart. Active MyChart status and patient understanding of how to access instructions and care plan via MyChart confirmed with patient.     The Managed Medicaid care management team will reach out to the patient again over the next 30 business  days.  The  Patient  has been provided with contact information for the Managed Medicaid care management team and has been advised to call with any health related questions or concerns.   [name] RN, BSN [place]  [facility] Care Management Coordinator - Managed Medicaid High Risk [phone]    Following is a copy of your plan of care:  Care Plan : RN Care Manager Plan of Care  Updates made by [name], RN since 12/10/2022 12:00 AM     Problem: Health Promotion or Disease Self-Management (General Plan of Care)      Long-Range Goal: Chronic Disease Management   Start Date: 12/16/2021  Expected End Date: 03/12/2023  Priority: High  Note:   Current Barriers:  Knowledge Deficits related to plan of care for management of recent ischemic stroke, arthritis, anxiety/depression, tobacco use, GERD, HTN, h/o DVT Chronic Disease Management support and education needs related to recent ischemic stroke, arthritis, anxiety/depression, tobacco use, GERD, HTN, h/o DVT 12/10/22: hosp [facility]2-10/14 at [facility] for suicide attempt, 10/14 at [name] for suicical ideation-offered LCSW services and declined.  Has f/u with PCP 11/12.  States he is okay and safe today.  Would like housing resources and will refer.  Needs to r/s PULM and surgery appt-was in hospital.   Smokes 2ppd-no BP check.  RNCM Clinical Goal(s):  Patient will verbalize understanding of plan for management of ischemic stroke  as evidenced by patient report and follow up  through collaboration with RN Care manager, provider, and care team.   Interventions: Inter-disciplinary care team collaboration (see longitudinal plan of care) Evaluation of current treatment plan related to  self management and patient's adherence to plan as established by provider Patient provided with eye provider information. Collaborated with [name] [name] referral for dental resources, housing resources  Hypertension Interventions:  (Status:  New goal.) Long Term Goal Last practice recorded BP readings:  BP Readings from Last 3 Encounters:        12/29/21 126/88  05/12/22:            137/100 08/18/22:          134/86  Most recent eGFR/CrCl: No results found for: "EGFR"  No components found for: "CRCL"  Evaluation of current treatment plan related to hypertension self management and patient's adherence to plan as established by provider Reviewed medications with patient and discussed importance of compliance Discussed plans with patient for ongoing care management follow up and provided patient with direct contact information for care management team Reviewed scheduled/upcoming provider appointments including:  Assessed social determinant of health barriers   Stroke:  (Status:New goal.) Long Term Goal Reviewed Importance of taking all medications as prescribed Reviewed Importance of attending all scheduled provider appointments Advised to report any changes in symptoms or exercise tolerance Assessed social determinant of health barriers Assessed for signs and symptoms of stroke Assessed for cognitive impairment Assessed use of tobacco use  Patient Goals/Self-Care Activities: Take all medications as prescribed Attend all scheduled provider appointments Call pharmacy for medication refills 3-7 days in  advance of running out of medications Perform all self care activities independently  Perform IADL's (shopping, preparing meals, housekeeping, managing finances) independently Call provider office for new concerns or questions  Patient to follow up with provider regarding Zoloft Patient's DPR to call and schedule CT Pelvis as ordered. Patient to reschedule dental appt and schedule PCP appt.  Follow Up Plan:  The patient has been provided with contact information for the care management team and has been advised to call with any health related questions or concerns.  The care management team will reach out to the patient again over the next 30 business  days.

## 2022-12-17 ENCOUNTER — Other Ambulatory Visit: Payer: Self-pay

## 2022-12-17 NOTE — Patient Instructions (Signed)
Visit Information  Mr. Diskin was given information about Medicaid Managed Care team care coordination services as a part of their [facility] Medicaid benefit. Dax Lorenzi verbally consented to engagement with the [facility] Managed Care team.   If you are experiencing a medical emergency, please call 911 or report to your local emergency department or urgent care.   If you have a non-emergency medical problem during routine business hours, please contact your provider's office and ask to speak with a nurse.   For questions related to your [facility] health plan, please call: [phone] or go here:[email or web address]  If you would like to schedule transportation through your [facility] plan, please call the following number at least 2 days in advance of your appointment: [phone].   You can also use the MTM portal or MTM mobile app to manage your rides. Reimbursement for transportation is available through [facility]! For the portal, please go to [email or web address]  Call the [facility] Crisis Line at [phone], at any time, 24 hours a day, 7 days a week. If you are in danger or need immediate medical attention call 911.  If you would like help to quit smoking, call 1-800-QUIT-NOW ((709) 089-2035) OR Espaol: 1-855-Djelo-Ya (0-175-102-5852) o para ms informacin haga clic aqu or Text READY to 778-242 to register via text  Mr. Lasanta - following are the goals we discussed in your visit today:   Goals Addressed   None      Social Worker will follow up in 12 days.   [name], [name], MHA [facility] Health  Managed Medicaid Social Worker [id number]   Following is a copy of your plan of care:  There are no care plans that you recently modified to display for this patient.

## 2022-12-17 NOTE — Patient Outreach (Signed)
Medicaid Managed Chan Social Work Note  12/17/2022 Name:  [name] MRN:  [id number] DOB:  03/22/1965  [name] is an 57 y.o. year old male who is a primary patient of Clifford Laroche, [name].  The [name] Managed Chan Coordination team was consulted for assistance with:   housing and food resources  [name] was given information about Medicaid Managed Chan Coordination team services today. [name] Patient agreed to services and verbal consent obtained.  Engaged with patient  for by telephone forfollow up visit in response to referral for case management and/or Chan coordination services.   Assessments/Interventions:  Review of past medical history, allergies, medications, health status, including review of consultants reports, laboratory and other test data, was performed as part of comprehensive evaluation and provision of chronic Chan management services.  SDOH: (Social Determinant of Health) assessments and interventions performed: SDOH Interventions    Flowsheet Row Patient Outreach Telephone from 12/10/2022 in [name] Patient Outreach Telephone from 09/16/2022 in [name] Patient Outreach Telephone from 08/16/2022 in [name] Patient Outreach Telephone from 06/15/2022 in [name] Erroneous Encounter from 06/01/2022 in [name] Patient Outreach Telephone from 05/13/2022 in [name]  SDOH Interventions        Food Insecurity Interventions -- -- Intervention Not Indicated -- Intervention Not Indicated --  Housing Interventions -- -- -- Intervention Not Indicated Intervention Not Indicated --  Transportation Interventions -- -- -- -- Intervention Not Indicated Intervention Not Indicated  Utilities Interventions -- -- -- -- Intervention Not Indicated Intervention Not Indicated  Alcohol Usage  Interventions -- -- -- Intervention Not Indicated (Score <7) -- --  Financial Strain Interventions Intervention Not Indicated -- -- -- Intervention Not Indicated --  Physical Activity Interventions -- -- -- -- Intervention Not Indicated  [patient with right leg weakness and some left leg numbeness-residual effects from stroke] --  Stress Interventions Intervention Not Indicated -- -- -- Intervention Not Indicated --  Social Connections Interventions -- -- -- -- Intervention Not Indicated --  Health Literacy Interventions -- Intervention Not Indicated -- -- -- --      [name] completed a telephone outreach with patient, he states he has to be out of his current place by the Dec 1. Patient states he needs assistance with rent and somewhere to move. Patient states he would prefer to stay in [facility]. [name] and patient agreed for housing resources to be mailed to him. Patient states he does receieve foodstamps but would also like food pantries. Advanced Directives Status:  Not addressed in this encounter.  Chan Plan                 No Known Allergies  Medications Reviewed Today   Medications were not reviewed in this encounter     Patient Active Problem List   Diagnosis Date Noted   Severe episode of recurrent major depressive disorder, with psychotic features (HCC) 11/23/2022   Constipation 11/18/2022   Suicidal ideation 11/11/2022   Severe episode of recurrent major depressive disorder, without psychotic features (HCC) 11/11/2022   Alcohol abuse 11/11/2022   Moderate cocaine use disorder (HCC) 11/11/2022   MDD (major depressive disorder), recurrent severe, without psychosis (HCC) 11/11/2022   Alcohol dependence (HCC) 11/11/2022   History of colonic polyps 09/13/2022   Unilateral inguinal hernia without obstruction or gangrene 09/13/2022   Erectile dysfunction 03/17/2022   Inguinal bulge 03/17/2022  Depression due to old stroke 02/13/2022   Encounter for general adult medical  examination with abnormal findings 02/13/2022   Elevated LDL cholesterol level 02/13/2022   Primary hypertension 02/13/2022   Intermittent explosive disorder 11/22/2021   Protein-calorie malnutrition, severe 10/31/2021   Anoxic brain injury (HCC) 10/31/2021   Acute respiratory failure with hypoxia (HCC)    Cerebrovascular accident (CVA) (HCC)    Acute encephalopathy 10/22/2021   Fever    Pleural effusion    Deep venous thrombosis (HCC)    Pain and swelling of left lower leg 12/28/2018   Septic arthritis of shoulder, right (HCC) 09/07/2018   Septic arthritis of sacroiliac joint (HCC) 09/07/2018   Sternoclavicular joint pain, left 09/07/2018   Discitis of lumbosacral region 06/28/2018   Psoas abscess, right (HCC) 06/28/2018   Polysubstance abuse (HCC) 06/24/2018   Normocytic anemia 06/24/2018   Recurrent boils 06/24/2018   Head injury    Septic arthritis of knee, left (HCC)    Acute medial meniscal tear, left, initial encounter    GERD (gastroesophageal reflux disease) 06/22/2018   Mild protein-calorie malnutrition (HCC) 06/22/2018   AKI (acute kidney injury) (HCC) 06/21/2018   Spinal stenosis of lumbosacral region 06/21/2018   Chronic hepatitis C (HCC) 12/17/2013   Cigarette smoker 12/17/2013   Family history of diabetes mellitus (DM) 12/17/2013   Notalgia 12/17/2013    Conditions to be addressed/monitored per PCP order:   housing and food resources  There are no Chan plans that you recently modified to display for this patient.   Follow up:  Patient agrees to Chan Plan and Follow-up.  Plan: The Managed Medicaid Chan management team will reach out to the patient again over the next 12 days.  Date/time of next scheduled Social Work Chan management/Chan coordination outreach:  01/04/23  [name], [name], [facility]  Managed [facility] Social Worker [phone]

## 2022-12-21 ENCOUNTER — Ambulatory Visit (INDEPENDENT_AMBULATORY_CARE_PROVIDER_SITE_OTHER): Payer: Medicaid Other | Admitting: Family Medicine

## 2022-12-21 ENCOUNTER — Encounter: Payer: Self-pay | Admitting: Family Medicine

## 2022-12-21 VITALS — BP 148/100 | HR 124 | Resp 16 | Ht 70.0 in | Wt 191.1 lb

## 2022-12-21 DIAGNOSIS — R0781 Pleurodynia: Secondary | ICD-10-CM | POA: Diagnosis not present

## 2022-12-21 DIAGNOSIS — J4520 Mild intermittent asthma, uncomplicated: Secondary | ICD-10-CM | POA: Diagnosis not present

## 2022-12-21 DIAGNOSIS — E7849 Other hyperlipidemia: Secondary | ICD-10-CM

## 2022-12-21 DIAGNOSIS — F17210 Nicotine dependence, cigarettes, uncomplicated: Secondary | ICD-10-CM | POA: Diagnosis not present

## 2022-12-21 DIAGNOSIS — I10 Essential (primary) hypertension: Secondary | ICD-10-CM | POA: Diagnosis not present

## 2022-12-21 DIAGNOSIS — K219 Gastro-esophageal reflux disease without esophagitis: Secondary | ICD-10-CM | POA: Diagnosis not present

## 2022-12-21 DIAGNOSIS — F419 Anxiety disorder, unspecified: Secondary | ICD-10-CM

## 2022-12-21 DIAGNOSIS — R0789 Other chest pain: Secondary | ICD-10-CM

## 2022-12-21 HISTORY — DX: Anxiety disorder, unspecified: F41.9

## 2022-12-21 MED ORDER — IPRATROPIUM-ALBUTEROL 0.5-2.5 (3) MG/3ML IN SOLN: 3.0000 mL | RESPIRATORY_TRACT | 0 refills | Status: DC | PRN

## 2022-12-21 MED ORDER — ATORVASTATIN CALCIUM 20 MG PO TABS: 20.0000 mg | ORAL_TABLET | Freq: Every day | ORAL | 0 refills | Status: AC

## 2022-12-21 MED ORDER — AMLODIPINE BESYLATE 10 MG PO TABS: 10.0000 mg | ORAL_TABLET | Freq: Every day | ORAL | 0 refills | Status: DC

## 2022-12-21 MED ORDER — VENTOLIN HFA 108 (90 BASE) MCG/ACT IN AERS: 2.0000 | INHALATION_SPRAY | Freq: Four times a day (QID) | RESPIRATORY_TRACT | 0 refills | Status: AC | PRN

## 2022-12-21 MED ORDER — FLUOXETINE HCL 40 MG PO CAPS: 40.0000 mg | ORAL_CAPSULE | Freq: Every day | ORAL | 0 refills | Status: DC

## 2022-12-21 MED ORDER — PANTOPRAZOLE SODIUM 40 MG PO TBEC: 40.0000 mg | DELAYED_RELEASE_TABLET | Freq: Every day | ORAL | Status: AC

## 2022-12-21 MED ORDER — NAPROXEN 500 MG PO TABS
500.0000 mg | ORAL_TABLET | Freq: Two times a day (BID) | ORAL | 1 refills | Status: DC
Start: 2022-12-21 — End: 2023-03-23

## 2022-12-21 MED ORDER — HYDROXYZINE PAMOATE 25 MG PO CAPS: 25.0000 mg | ORAL_CAPSULE | Freq: Three times a day (TID) | ORAL | 0 refills | Status: DC | PRN

## 2022-12-21 NOTE — Patient Instructions (Addendum)
I appreciate the opportunity to provide care to you today!    Follow up:  1 month BP   Left Rib Pain:  Apply over-the-counter Advil Targeted Relief pain-relieving cream to the affected site. Take naproxen 500 mg twice daily for pain relief. Stop by [facility] Hawking for an X-ray of the left ribs and chest to assess for fractures or bony abnormalities. Apply an ice pack for 15-20 minutes, 3-4 times daily, to reduce pain and inflammation. Rest and modify activities, avoiding heavy lifting or movements that worsen the pain. Perform deep breathing exercises to prevent atelectasis and pneumonia.  Anxiety: -Hydroxyzine 25 mg may be taken every 8 hours as needed for anxiety. A refill of Prozac 50 mg daily has been sent to your pharmacy.  Hypertension: Refills for olmesartan 20 mg daily and amlodipine 10 mg daily have been sent to your pharmacy. Follow a low-sodium diet and increase physical activity. A follow-up in one month is recommended to assess blood pressure. Seek immediate care at the ED if blood pressure exceeds 180/120 with symptoms such as headaches, dizziness, blurred vision, chest pain, or palpitations.    Please continue to a heart-healthy diet and increase your physical activities. Try to exercise for at least five days a week.    It was a pleasure to see you and I look forward to continuing to work together on your health and well-being. Please do not hesitate to call the office if you need care or have questions about your care.  In case of emergency, please visit the Emergency Department for urgent care, or contact our clinic at [phone] to schedule an appointment. We're here to help you!   Have a wonderful day and week. With Gratitude, [name] MSN, [name]-BC

## 2022-12-21 NOTE — Assessment & Plan Note (Addendum)
The patient reports successfully quitting smoking cold [place] about a month ago.  He was congratulated on his commitment to smoking cessation.

## 2022-12-21 NOTE — Assessment & Plan Note (Signed)
The patient's blood pressure is uncontrolled today, likely due to increased pain from his recent injury. He is asymptomatic in the clinic but has been out of his medication for several months. We will reinstate therapy with olmesartan 20 mg and amlodipine 10 mg. A low-sodium diet and increased physical activity were encouraged. A follow-up appointment is scheduled in 4 weeks to assess his blood pressure. He was advised to seek immediate care or visit the ED if his blood pressure exceeds 180/120 with symptoms such as severe headaches, chest pain, palpitations, or shortness of breath.

## 2022-12-21 NOTE — Progress Notes (Signed)
Established Patient Office Visit  Subjective:  Patient ID: [name], male    DOB: 1966-01-21  Age: 57 y.o. MRN: [id number]  CC:  Chief Complaint  Patient presents with   Genia Hotter out of tree stand yesterday and hurting in his left rib area and has scratches across his abdomen    Anxiety    States he is needing something for his nerves. Is currently out of all medication     HPI [name] is a 57 y.o. male with past medical history of primary hypertension, GERD, major depressive disorder presents for f/u of  chronic medical conditions.  Fall, Initial Encounter: The patient reports sustaining a fall two days ago while setting up his deer stand, which slipped, causing him to fall. He reports pain in his left rib area with scratches across his abdomen, rating the pain as 7 out of 10.  Anxiety: The patient shares that his longtime girlfriend left him a few months ago with his five children, which has significantly impacted his mental health. He denies suicidal thoughts or ideation today. Although he is under the care of behavioral health, he has not followed up and has been out of his Prozac 50 mg daily for several months.  Hypertension: The patient reports being out of his hypertension medication, including amlodipine 10 mg and olmesartan 20 mg daily, for several months. He is asymptomatic in the clinic today.   Past Medical History:  Diagnosis Date   Acid reflux    Emphysema of lung (HCC)    Head injury    Hepatitis C    S/p treatment with Epclusa in 2020   Hiatal hernia    HTN (hypertension)    Pulmonary nodules    Stroke [facility])     Past Surgical History:  Procedure Laterality Date   ACROMIO-CLAVICULAR JOINT REPAIR Right 06/28/2018   Procedure: ACROMIO-CLAVICULAR JOINT IRRIGATION AND DEBRIDEMENT;  Surgeon: Cammy Copa, MD;  Location: [facility] OR;  Service: Orthopedics;  Laterality: Right;   FACIAL FRACTURE SURGERY     IRRIGATION AND DEBRIDEMENT SHOULDER Right  06/28/2018   Procedure: IRRIGATION AND DEBRIDEMENT SHOULDER;  Surgeon: Cammy Copa, MD;  Location: [facility] OR;  Service: Orthopedics;  Laterality: Right;   KNEE ARTHROSCOPY Left 06/23/2018   Procedure: LEFT KNEE ARTHROSCOPY KNEE I&D.;  Surgeon: Cammy Copa, MD;  Location: [facility] OR;  Service: Orthopedics;  Laterality: Left;    Family History  Problem Relation Age of Onset   Diabetes Mother    Hypertension Mother    Stroke Father    Hypertension Father    Colon cancer Neg Hx     Social History   Socioeconomic History   Marital status: Single    Spouse name: Not on file   Number of children: Not on file   Years of education: Not on file   Highest education level: Not on file  Occupational History   Not on file  Tobacco Use   Smoking status: Former    Current packs/day: 2.00    Average packs/day: 2.0 packs/day for 30.0 years (60.0 ttl pk-yrs)    Types: Cigarettes   Smokeless tobacco: Never   Tobacco comments:    Quit for 8 months  Vaping Use   Vaping status: Never Used  Substance and Sexual Activity   Alcohol use: Yes    Comment: 6 pack a week; but last drank 08/12/22.   Drug use: Yes    Types: Cocaine    Comment: states  its "been a long time"    Sexual activity: Not Currently  Other Topics Concern   Not on file  Social History Narrative   Not on file   Social Determinants of Health   Financial Resource Strain: Low Risk  (12/10/2022)   Overall Financial Resource Strain (CARDIA)    Difficulty of Paying Living Expenses: Not very hard  Food Insecurity: No Food Insecurity (11/11/2022)   Hunger Vital Sign    Worried About Running Out of Food in the Last Year: Never true    Ran Out of Food in the Last Year: Never true  Transportation Needs: No Transportation Needs (11/11/2022)   PRAPARE - Administrator, Civil Service (Medical): No    Lack of Transportation (Non-Medical): No  Physical Activity: Insufficiently Active (06/01/2022)   Exercise Vital Sign     Days of Exercise per Week: 7 days    Minutes of Exercise per Session: 10 min  Stress: No Stress Concern Present (12/10/2022)   [employer] of Occupational Health - Occupational Stress Questionnaire    Feeling of Stress : Not at all  Social Connections: Moderately Isolated (06/01/2022)   Social Connection and Isolation Panel [NHANES]    Frequency of Communication with Friends and Family: More than three times a week    Frequency of Social Gatherings with Friends and Family: More than three times a week    Attends Religious Services: Never    Database administrator or Organizations: No    Attends Banker Meetings: Never    Marital Status: Living with partner  Intimate Partner Violence: Not At Risk (11/11/2022)   Humiliation, Afraid, Rape, and Kick questionnaire    Fear of Current or Ex-Partner: No    Emotionally Abused: No    Physically Abused: No    Sexually Abused: No    Outpatient Medications Prior to Visit  Medication Sig Dispense Refill   docusate sodium (COLACE) 100 MG capsule Take 1 capsule (100 mg total) by mouth daily. (Patient not taking: Reported on 11/22/2022) 10 capsule 0   linaclotide (LINZESS) 290 MCG CAPS capsule Take 1 capsule (290 mcg total) by mouth daily before breakfast for 14 days. (Patient not taking: Reported on 11/22/2022) 14 capsule 0   melatonin 5 MG TABS Take 1 tablet (5 mg total) by mouth at bedtime. (Patient not taking: Reported on 11/22/2022)     polyethylene glycol (MIRALAX / GLYCOLAX) 17 g packet Take 17 g by mouth 2 (two) times daily. (Patient not taking: Reported on 11/22/2022)     risperiDONE (RISPERDAL) 1 MG tablet Take 1 tablet (1 mg total) by mouth at bedtime for 14 days. (Patient not taking: Reported on 11/22/2022) 14 tablet 0   amLODipine (NORVASC) 10 MG tablet Take 1 tablet (10 mg total) by mouth daily for 14 days. (Patient not taking: Reported on 11/22/2022) 14 tablet 0   atorvastatin (LIPITOR) 20 MG tablet Take 1 tablet (20 mg  total) by mouth daily for 14 days. (Patient not taking: Reported on 11/22/2022) 14 tablet 0   FLUoxetine (PROZAC) 40 MG capsule Take 1 capsule (40 mg total) by mouth daily for 14 days. (Patient not taking: Reported on 11/22/2022) 14 capsule 0   ipratropium-albuterol (DUONEB) 0.5-2.5 (3) MG/3ML SOLN Take 3 mLs by nebulization every 4 (four) hours as needed (wheezing, shortness of breath). (Patient not taking: Reported on 12/10/2022)     mirtazapine (REMERON) 7.5 MG tablet Take 1 tablet (7.5 mg total) by mouth at bedtime as needed  for up to 14 days (sleep). (Patient not taking: Reported on 11/22/2022) 14 tablet 0   pantoprazole (PROTONIX) 40 MG tablet Take 1 tablet (40 mg total) by mouth daily. (Patient not taking: Reported on 11/22/2022)     VENTOLIN HFA 108 (90 Base) MCG/ACT inhaler Inhale 2 puffs into the lungs every 6 (six) hours as needed for wheezing or shortness of breath. (Patient not taking: Reported on 11/22/2022)     No facility-administered medications prior to visit.    No Known Allergies  ROS Review of Systems  Constitutional:  Negative for fatigue and fever.  Eyes:  Negative for visual disturbance.  Respiratory:  Negative for chest tightness and shortness of breath.   Cardiovascular:  Negative for chest pain and palpitations.  Skin:        Scratches on abdomen  Neurological:  Negative for dizziness and headaches.      Objective:    Physical Exam HENT:     Head: Normocephalic.     Right Ear: External ear normal.     Left Ear: External ear normal.     Nose: No congestion or rhinorrhea.     Mouth/Throat:     Mouth: Mucous membranes are moist.  Cardiovascular:     Rate and Rhythm: Regular rhythm.     Heart sounds: No murmur heard. Pulmonary:     Effort: No respiratory distress.     Breath sounds: Normal breath sounds.  Skin:    Comments: Skin abrasions noted on the abdomen and left rib/ tender to palpation of the left rib  Neurological:     Mental Status: He is  alert.     BP (!) 148/100   Pulse (!) 124   Resp 16   Ht 5\' 10"  (1.778 m)   Wt 191 lb 1.9 oz (86.7 kg)   SpO2 95%   BMI 27.42 kg/m  Wt Readings from Last 3 Encounters:  12/21/22 191 lb 1.9 oz (86.7 kg)  11/22/22 190 lb (86.2 kg)  11/11/22 179 lb (81.2 kg)    Lab Results  Component Value Date   TSH 2.234 10/22/2021   Lab Results  Component Value Date   WBC 9.2 11/22/2022   HGB 13.7 11/22/2022   HCT 40.8 11/22/2022   MCV 94.7 11/22/2022   PLT 328 11/22/2022   Lab Results  Component Value Date   NA 136 11/22/2022   K 3.9 11/22/2022   CO2 24 11/22/2022   GLUCOSE 105 (H) 11/22/2022   BUN 17 11/22/2022   CREATININE 0.99 11/22/2022   BILITOT 0.4 11/22/2022   ALKPHOS 73 11/22/2022   AST 37 11/22/2022   ALT 58 (H) 11/22/2022   PROT 6.8 11/22/2022   ALBUMIN 3.8 11/22/2022   CALCIUM 9.1 11/22/2022   ANIONGAP 8 11/22/2022   EGFR 100 04/27/2022   Lab Results  Component Value Date   CHOL 221 (H) 11/16/2022   Lab Results  Component Value Date   HDL 49 11/16/2022   Lab Results  Component Value Date   LDLCALC 111 (H) 11/16/2022   Lab Results  Component Value Date   TRIG 303 (H) 11/16/2022   Lab Results  Component Value Date   CHOLHDL 4.5 11/16/2022   Lab Results  Component Value Date   HGBA1C 5.5 11/16/2022      Assessment & Plan:  Rib pain on left side Assessment & Plan: Apply over-the-counter Advil Targeted Relief pain-relieving cream to the affected site. Take naproxen 500 mg twice daily for pain relief. Stop by  Je[facility] Hawking for an X-ray of the left ribs and chest to assess for fractures or bony abnormalities. Apply an ice pack for 15-20 minutes, 3-4 times daily, to reduce pain and inflammation. Rest and modify activities, avoiding heavy lifting or movements that worsen the pain. Perform deep breathing exercises to prevent atelectasis and pneumonia.  Orders: -     DG Ribs Unilateral W/Chest Left -     Naproxen; Take 1 tablet (500 mg total) by  mouth 2 (two) times daily with a meal.  Dispense: 60 tablet; Refill: 1  Primary hypertension Assessment & Plan: The patient's blood pressure is uncontrolled today, likely due to increased pain from his recent injury. He is asymptomatic in the clinic but has been out of his medication for several months. We will reinstate therapy with olmesartan 20 mg and amlodipine 10 mg. A low-sodium diet and increased physical activity were encouraged. A follow-up appointment is scheduled in 4 weeks to assess his blood pressure. He was advised to seek immediate care or visit the ED if his blood pressure exceeds 180/120 with symptoms such as severe headaches, chest pain, palpitations, or shortness of breath.    Orders: -     amLODIPine Besylate; Take 1 tablet (10 mg total) by mouth daily for 14 days.  Dispense: 14 tablet; Refill: 0  Anxiety Assessment & Plan: Flowsheet Row Office Visit from 12/21/2022 in [facility]  PHQ-9 Total Score 20     Denies suicidal thoughts and ideation Encouraged to continue to follow behavioral health for cognitive behavioral therapy We will reinstate Prozac 50 mg daily Encouraged to take hydroxyzine 25 mg may be taken every 8 hours as needed for anxiety   Orders: -     hydrOXYzine Pamoate; Take 1 capsule (25 mg total) by mouth every 8 (eight) hours as needed.  Dispense: 30 capsule; Refill: 0 -     FLUoxetine HCl; Take 1 capsule (40 mg total) by mouth daily for 14 days.  Dispense: 14 capsule; Refill: 0  Cigarette smoker Assessment & Plan: The patient reports successfully quitting smoking cold [place] about a month ago.  He was congratulated on his commitment to smoking cessation.    Other hyperlipidemia -     Atorvastatin Calcium; Take 1 tablet (20 mg total) by mouth daily for 14 days.  Dispense: 14 tablet; Refill: 0  Mild intermittent asthma, unspecified whether complicated -     Ipratropium-Albuterol; Take 3 mLs by nebulization every 4 (four)  hours as needed (wheezing, shortness of breath).  Dispense: 360 mL; Refill: 0 -     Ventolin HFA; Inhale 2 puffs into the lungs every 6 (six) hours as needed for wheezing or shortness of breath.  Dispense: 6.7 g; Refill: 0  Gastroesophageal reflux disease without esophagitis -     Pantoprazole Sodium; Take 1 tablet (40 mg total) by mouth daily.  Note: This chart has been completed using Engineer, civil (consulting) software, and while attempts have been made to ensure accuracy, certain words and phrases may not be transcribed as intended.    Follow-up: Return in about 1 month (around 01/20/2023) for BP.   [name], [name]

## 2022-12-21 NOTE — Assessment & Plan Note (Signed)
Flowsheet Row Office Visit from 12/21/2022 in [facility]  PHQ-9 Total Score 20     Denies suicidal thoughts and ideation Encouraged to continue to follow behavioral health for cognitive behavioral therapy We will reinstate Prozac 50 mg daily Encouraged to take hydroxyzine 25 mg may be taken every 8 hours as needed for anxiety

## 2022-12-21 NOTE — Assessment & Plan Note (Signed)
Apply over-the-counter Advil Targeted Relief pain-relieving cream to the affected site. Take naproxen 500 mg twice daily for pain relief. Stop by Jeani Hawking for an X-ray of the left ribs and chest to assess for fractures or bony abnormalities. Apply an ice pack for 15-20 minutes, 3-4 times daily, to reduce pain and inflammation. Rest and modify activities, avoiding heavy lifting or movements that worsen the pain. Perform deep breathing exercises to prevent atelectasis and pneumonia.

## 2022-12-31 ENCOUNTER — Ambulatory Visit (HOSPITAL_COMMUNITY): Admission: RE | Admit: 2022-12-31 | Payer: Medicaid Other | Source: Ambulatory Visit

## 2023-01-04 ENCOUNTER — Other Ambulatory Visit: Payer: Self-pay

## 2023-01-04 NOTE — Patient Instructions (Signed)
Visit Information  Mr. Salee was given information about Medicaid Managed Care team care coordination services as a part of their [facility] Medicaid benefit. Enneth Pendley verbally consented to engagement with the [facility] Managed Care team.   If you are experiencing a medical emergency, please call 911 or report to your local emergency department or urgent care.   If you have a non-emergency medical problem during routine business hours, please contact your provider's office and ask to speak with a nurse.   For questions related to your [facility] health plan, please call: [phone] or go here:[email or web address]  If you would like to schedule transportation through your [facility] plan, please call the following number at least 2 days in advance of your appointment: [phone].   You can also use the MTM portal or MTM mobile app to manage your rides. Reimbursement for transportation is available through [facility] - ! For the portal, please go to [email or web address]  Call the [facility] Crisis Line at [phone], at any time, 24 hours a day, 7 days a week. If you are in danger or need immediate medical attention call 911.  If you would like help to quit smoking, call 1-800-QUIT-NOW (6691961521) OR Espaol: 1-855-Djelo-Ya (9-563-875-6433) o para ms informacin haga clic aqu or Text READY to 295-188 to register via text  Mr. Nakayama - following are the goals we discussed in your visit today:   Goals Addressed   None      Social Worker will follow up in 12 days.   [name], [name], MHA [facility]  Managed Medicaid Social Worker [phone]   Following is a copy of your plan of care:  There are no care plans that you recently modified to display for this patient.

## 2023-01-04 NOTE — Patient Outreach (Signed)
Medicaid Managed Care Social Work Note  01/04/2023 Name:  [name] MRN:  [id number] DOB:  1965/05/27  [name] is an 57 y.o. year old male who is a primary patient of Clifford Laroche, [name].  The [facility] Managed Care Coordination team was consulted for assistance with:   housing   [name] was given information about Medicaid Managed Care Coordination team services today. [name] Patient agreed to services and verbal consent obtained.  Engaged with patient  for by telephone forfollow up visit in response to referral for case management and/or care coordination services.   Assessments/Interventions:  Review of past medical history, allergies, medications, health status, including review of consultants reports, laboratory and other test data, was performed as part of comprehensive evaluation and provision of chronic care management services.  SDOH: (Social Determinant of Health) assessments and interventions performed: SDOH Interventions    Flowsheet Row Office Visit from 12/21/2022 in [facility] Patient Outreach Telephone from 12/10/2022 in [place] POPULATION HEALTH DEPARTMENT Patient Outreach Telephone from 09/16/2022 in [place] POPULATION HEALTH DEPARTMENT Patient Outreach Telephone from 08/16/2022 in [place] POPULATION HEALTH DEPARTMENT Patient Outreach Telephone from 06/15/2022 in [place] POPULATION HEALTH DEPARTMENT Erroneous Encounter from 06/01/2022 in [facility]  SDOH Interventions        Food Insecurity Interventions -- -- -- Intervention Not Indicated -- Intervention Not Indicated  Housing Interventions -- -- -- -- Intervention Not Indicated Intervention Not Indicated  Transportation Interventions -- -- -- -- -- Intervention Not Indicated  Utilities Interventions -- -- -- -- -- Intervention Not Indicated  Alcohol Usage Interventions -- -- -- -- Intervention Not Indicated (Score <7) --  Depression  Interventions/Treatment  Medication, Counseling -- -- -- -- --  Financial Strain Interventions -- Intervention Not Indicated -- -- -- Intervention Not Indicated  Physical Activity Interventions -- -- -- -- -- Intervention Not Indicated  [patient with right leg weakness and some left leg numbeness-residual effects from stroke]  Stress Interventions -- Intervention Not Indicated -- -- -- Intervention Not Indicated  Social Connections Interventions -- -- -- -- -- Intervention Not Indicated  Health Literacy Interventions -- -- Intervention Not Indicated -- -- --     [name] completed a telephone outreach with patient, he states he did not receive the housing resources [name] sent him. Patient states he is still staying in the same place, [name] will resend resources. [name] asked if patient had an email she could send them too and he stated he does not.  Advanced Directives Status:  Not addressed in this encounter.  Care Plan                 No Known Allergies  Medications Reviewed Today   Medications were not reviewed in this encounter     Patient Active Problem List   Diagnosis Date Noted   Rib pain on left side 12/21/2022   Anxiety 12/21/2022   Severe episode of recurrent major depressive disorder, with psychotic features (HCC) 11/23/2022   Constipation 11/18/2022   Suicidal ideation 11/11/2022   Severe episode of recurrent major depressive disorder, without psychotic features (HCC) 11/11/2022   Alcohol abuse 11/11/2022   Moderate cocaine use disorder (HCC) 11/11/2022   MDD (major depressive disorder), recurrent severe, without psychosis (HCC) 11/11/2022   Alcohol dependence (HCC) 11/11/2022   History of colonic polyps 09/13/2022   Unilateral inguinal hernia without obstruction or gangrene 09/13/2022   Erectile dysfunction 03/17/2022   Inguinal bulge 03/17/2022  Depression due to old stroke 02/13/2022   Encounter for general adult medical examination with abnormal findings 02/13/2022    Elevated LDL cholesterol level 02/13/2022   Primary hypertension 02/13/2022   Intermittent explosive disorder 11/22/2021   Protein-calorie malnutrition, severe 10/31/2021   Anoxic brain injury (HCC) 10/31/2021   Acute respiratory failure with hypoxia (HCC)    Cerebrovascular accident (CVA) (HCC)    Acute encephalopathy 10/22/2021   Fever    Pleural effusion    Deep venous thrombosis (HCC)    Pain and swelling of left lower leg 12/28/2018   Septic arthritis of shoulder, right (HCC) 09/07/2018   Septic arthritis of sacroiliac joint (HCC) 09/07/2018   Sternoclavicular joint pain, left 09/07/2018   Discitis of lumbosacral region 06/28/2018   Psoas abscess, right (HCC) 06/28/2018   Polysubstance abuse (HCC) 06/24/2018   Normocytic anemia 06/24/2018   Recurrent boils 06/24/2018   Head injury    Septic arthritis of knee, left (HCC)    Acute medial meniscal tear, left, initial encounter    GERD (gastroesophageal reflux disease) 06/22/2018   Mild protein-calorie malnutrition (HCC) 06/22/2018   AKI (acute kidney injury) (HCC) 06/21/2018   Spinal stenosis of lumbosacral region 06/21/2018   Chronic hepatitis C (HCC) 12/17/2013   Cigarette smoker 12/17/2013   Family history of diabetes mellitus (DM) 12/17/2013   Notalgia 12/17/2013    Conditions to be addressed/monitored per PCP order:   housing  There are no care plans that you recently modified to display for this patient.   Follow up:  Patient agrees to Care Plan and Follow-up.  Plan: The Managed Medicaid care management team will reach out to the patient again over the next 12 days.  Date/time of next scheduled Social Work care management/care coordination outreach:  01/20/23  [name], [name], [facility]  Managed [name] Social Worker [phone]

## 2023-01-06 ENCOUNTER — Emergency Department (HOSPITAL_COMMUNITY): Payer: Medicaid Other

## 2023-01-06 ENCOUNTER — Other Ambulatory Visit: Payer: Self-pay

## 2023-01-06 ENCOUNTER — Encounter (HOSPITAL_COMMUNITY): Payer: Self-pay

## 2023-01-06 ENCOUNTER — Emergency Department (HOSPITAL_COMMUNITY)
Admission: EM | Admit: 2023-01-06 | Discharge: 2023-01-06 | Disposition: A | Discharge status: Home / Self Care | Payer: Medicaid Other | Attending: Emergency Medicine | Admitting: Emergency Medicine

## 2023-01-06 DIAGNOSIS — Z20822 Contact with and (suspected) exposure to covid-19: Secondary | ICD-10-CM | POA: Insufficient documentation

## 2023-01-06 DIAGNOSIS — R6889 Other general symptoms and signs: Secondary | ICD-10-CM | POA: Diagnosis not present

## 2023-01-06 DIAGNOSIS — R0602 Shortness of breath: Secondary | ICD-10-CM | POA: Diagnosis present

## 2023-01-06 DIAGNOSIS — R0989 Other specified symptoms and signs involving the circulatory and respiratory systems: Secondary | ICD-10-CM | POA: Diagnosis not present

## 2023-01-06 DIAGNOSIS — R0689 Other abnormalities of breathing: Secondary | ICD-10-CM | POA: Diagnosis not present

## 2023-01-06 DIAGNOSIS — Z743 Need for continuous supervision: Secondary | ICD-10-CM | POA: Diagnosis not present

## 2023-01-06 DIAGNOSIS — J441 Chronic obstructive pulmonary disease with (acute) exacerbation: Secondary | ICD-10-CM | POA: Diagnosis not present

## 2023-01-06 DIAGNOSIS — J80 Acute respiratory distress syndrome: Secondary | ICD-10-CM | POA: Diagnosis not present

## 2023-01-06 LAB — BASIC METABOLIC PANEL
Anion gap: 8 (ref 5–15)
BUN: 28 mg/dL — ABNORMAL HIGH (ref 6–20)
CO2: 21 mmol/L — ABNORMAL LOW (ref 22–32)
Calcium: 9.1 mg/dL (ref 8.9–10.3)
Chloride: 110 mmol/L (ref 98–111)
Creatinine, Ser: 0.82 mg/dL (ref 0.61–1.24)
GFR, Estimated: >60 mL/min (ref >60)
Glucose, Bld: 111 mg/dL — ABNORMAL HIGH (ref 70–99)
Potassium: 3.3 mmol/L — ABNORMAL LOW (ref 3.5–5.1)
Sodium: 139 mmol/L (ref 135–145)

## 2023-01-06 LAB — TROPONIN I (HIGH SENSITIVITY)
Troponin I (High Sensitivity): 4 ng/L (ref <18)
Troponin I (High Sensitivity): 4 ng/L (ref <18)

## 2023-01-06 LAB — CBC WITH DIFFERENTIAL/PLATELET
Abs Immature Granulocytes: 0.1 10*3/uL — ABNORMAL HIGH (ref 0.00–0.07)
Basophils Absolute: 0.1 10*3/uL (ref 0.0–0.1)
Basophils Relative: 1 %
Eosinophils Absolute: 0.6 10*3/uL — ABNORMAL HIGH (ref 0.0–0.5)
Eosinophils Relative: 6 %
HCT: 40.6 % (ref 39.0–52.0)
Hemoglobin: 13.6 g/dL (ref 13.0–17.0)
Immature Granulocytes: 1 %
Lymphocytes Relative: 17 %
Lymphs Abs: 1.7 10*3/uL (ref 0.7–4.0)
MCH: 31.6 pg (ref 26.0–34.0)
MCHC: 33.5 g/dL (ref 30.0–36.0)
MCV: 94.4 fL (ref 80.0–100.0)
Monocytes Absolute: 0.6 10*3/uL (ref 0.1–1.0)
Monocytes Relative: 6 %
Neutro Abs: 6.8 10*3/uL (ref 1.7–7.7)
Neutrophils Relative %: 69 %
Platelets: 342 10*3/uL (ref 150–400)
RBC: 4.3 MIL/uL (ref 4.22–5.81)
RDW: 14.1 % (ref 11.5–15.5)
WBC: 9.9 10*3/uL (ref 4.0–10.5)
nRBC: 0 % (ref 0.0–0.2)

## 2023-01-06 LAB — RESP PANEL BY RT-PCR (RSV, FLU A&B, COVID)  RVPGX2
Influenza A by PCR: NEGATIVE
Influenza B by PCR: NEGATIVE
Resp Syncytial Virus by PCR: NEGATIVE
SARS Coronavirus 2 by RT PCR: NEGATIVE

## 2023-01-06 LAB — BRAIN NATRIURETIC PEPTIDE: B Natriuretic Peptide: 39 pg/mL (ref 0.0–100.0)

## 2023-01-06 MED ORDER — DOXYCYCLINE HYCLATE 100 MG PO CAPS: 100.0000 mg | ORAL_CAPSULE | Freq: Two times a day (BID) | ORAL | 0 refills | Status: DC

## 2023-01-06 MED ORDER — ALBUTEROL SULFATE (2.5 MG/3ML) 0.083% IN NEBU: 2.5000 mg | INHALATION_SOLUTION | RESPIRATORY_TRACT | 3 refills | Status: DC | PRN

## 2023-01-06 MED ORDER — HYDROCODONE BIT-HOMATROP MBR 5-1.5 MG/5ML PO SOLN
5.0000 mL | Freq: Four times a day (QID) | ORAL | 0 refills | Status: DC | PRN
Start: 2023-01-06 — End: 2023-03-23

## 2023-01-06 MED ORDER — ALBUTEROL SULFATE (2.5 MG/3ML) 0.083% IN NEBU
10.0000 mg/h | INHALATION_SOLUTION | Freq: Once | RESPIRATORY_TRACT | Status: AC
  Administered 2023-01-06: 10 mg/h via RESPIRATORY_TRACT
  Filled 2023-01-06: qty 12

## 2023-01-06 MED ORDER — PREDNISONE 20 MG PO TABS: 40.0000 mg | ORAL_TABLET | Freq: Every day | ORAL | 0 refills | Status: DC

## 2023-01-06 MED ORDER — ALBUTEROL SULFATE HFA 108 (90 BASE) MCG/ACT IN AERS: 2.0000 | INHALATION_SPRAY | RESPIRATORY_TRACT | 2 refills | Status: DC | PRN

## 2023-01-06 MED ORDER — LORAZEPAM 1 MG PO TABS
1.0000 mg | ORAL_TABLET | Freq: Once | ORAL | Status: AC
  Administered 2023-01-06: 1 mg via ORAL
  Filled 2023-01-06: qty 1

## 2023-01-06 MED ORDER — PREDNISONE 50 MG PO TABS
60.0000 mg | ORAL_TABLET | Freq: Once | ORAL | Status: AC
  Administered 2023-01-06: 60 mg via ORAL
  Filled 2023-01-06: qty 1

## 2023-01-06 MED ORDER — IPRATROPIUM BROMIDE 0.02 % IN SOLN
0.5000 mg | Freq: Once | RESPIRATORY_TRACT | Status: AC
  Administered 2023-01-06: 0.5 mg via RESPIRATORY_TRACT
  Filled 2023-01-06: qty 2.5

## 2023-01-06 NOTE — ED Notes (Signed)
Pt in bed, pt states that he is feeling anxious, pt reports sore throat, pt talking in complete sentences, resps even and unlabored, offered ice chips or popsicle to help sooth his throat. Pt refused, pt requests anxiety med, md notified, meds given.

## 2023-01-06 NOTE — ED Notes (Signed)
Pt in bed, pt states that his anxiety is better, pt awake and oriented times three, pt states that he is ready to go home, states that his girlfriend is on her way to come get him.  Re reviewed d/c instructions, pt verbalized understanding, resps even and unlabored, pt ambulatory to waiting room.

## 2023-01-06 NOTE — ED Triage Notes (Addendum)
Rcems from home. Multiple complaints but the main one is his COPD flaring up.  SOB x2 days. Cough congestion.   97% on room air. Took 1 1/2 albuterol tx at home. Also says he hasn't had a bm in 2 weeks. Concerned because he has a hernia.  20L FA  Very recently quit smoking

## 2023-01-06 NOTE — ED Provider Notes (Signed)
[place] EMERGENCY DEPARTMENT AT [facility] Provider Note   CSN: [id number] Arrival date & time: 01/06/23  0129     History  Chief Complaint  Patient presents with   Shortness of Breath    [name] is a 57 y.o. male.  Presents to the emergency department for evaluation of difficulty breathing.  Patient reports history of COPD.  He has been using his nebulizer through the course of today, feels like it may be making things worse.  He reports that he has had progressive shortness of breath over the last 2 to 3 days with significant productive cough.       Home Medications Prior to Admission medications   Medication Sig Start Date End Date Taking? Authorizing Provider  amLODipine (NORVASC) 10 MG tablet Take 1 tablet (10 mg total) by mouth daily for 14 days. 12/21/22 01/04/23  [name], [name]  atorvastatin (LIPITOR) 20 MG tablet Take 1 tablet (20 mg total) by mouth daily for 14 days. 12/21/22 01/04/23  [name], [name]  docusate sodium (COLACE) 100 MG capsule Take 1 capsule (100 mg total) by mouth daily. Patient not taking: Reported on 11/22/2022 11/23/22   [name], MD  FLUoxetine (PROZAC) 40 MG capsule Take 1 capsule (40 mg total) by mouth daily for 14 days. 12/21/22 01/04/23  [name], [name]  hydrOXYzine (VISTARIL) 25 MG capsule Take 1 capsule (25 mg total) by mouth every 8 (eight) hours as needed. 12/21/22   [name], [name]  ipratropium-albuterol (DUONEB) 0.5-2.5 (3) MG/3ML SOLN Take 3 mLs by nebulization every 4 (four) hours as needed (wheezing, shortness of breath). 12/21/22   [name], [name]  linaclotide (LINZESS) 290 MCG CAPS capsule Take 1 capsule (290 mcg total) by mouth daily before breakfast for 14 days. Patient not taking: Reported on 11/22/2022 11/23/22 12/07/22  [name], MD  melatonin 5 MG TABS Take 1 tablet (5 mg total) by mouth at bedtime. Patient not taking: Reported on 11/22/2022 11/22/22   [name], MD  naproxen (NAPROSYN) 500 MG tablet Take 1 tablet (500 mg total) by mouth 2 (two) times daily with a meal. 12/21/22   [name], [name]  pantoprazole (PROTONIX) 40 MG tablet Take 1 tablet (40 mg total) by mouth daily. 12/21/22   [name], [name]  polyethylene glycol (MIRALAX / GLYCOLAX) 17 g packet Take 17 g by mouth 2 (two) times daily. Patient not taking: Reported on 11/22/2022 11/22/22   [name], MD  risperiDONE (RISPERDAL) 1 MG tablet Take 1 tablet (1 mg total) by mouth at bedtime for 14 days. Patient not taking: Reported on 11/22/2022 11/22/22 12/06/22  [name], MD  VENTOLIN HFA 108 (90 Base) MCG/ACT inhaler Inhale 2 puffs into the lungs every 6 (six) hours as needed for wheezing or shortness of breath. 12/21/22   [name], [name]      Allergies    Patient has no known allergies.    Review of Systems   Review of Systems  Physical Exam Updated Vital Signs BP 109/69   Pulse 91   Temp 97.7 F (36.5 C) (Oral)   Resp 18   Ht 5\' 10"  (1.778 m)   Wt 86.7 kg   SpO2 95%   BMI 27.42 kg/m  Physical Exam Vitals and nursing note reviewed.  Constitutional:      General: He is not in acute distress.    Appearance: He is well-developed.  HENT:     Head: Normocephalic and atraumatic.     Mouth/Throat:  Mouth: Mucous membranes are moist.  Eyes:     General: Vision grossly intact. Gaze aligned appropriately.     Extraocular Movements: Extraocular movements intact.     Conjunctiva/sclera: Conjunctivae normal.  Cardiovascular:     Rate and Rhythm: Normal rate and regular rhythm.     Pulses: Normal pulses.     Heart sounds: Normal heart sounds, S1 normal and S2 normal. No murmur heard.    No friction rub. No gallop.  Pulmonary:     Effort: Pulmonary effort is normal. No respiratory distress.     Breath sounds: Decreased air movement present. Wheezing and rhonchi present.  Abdominal:     Palpations: Abdomen is soft.     Tenderness: There  is no abdominal tenderness. There is no guarding or rebound.     Hernia: No hernia is present.  Musculoskeletal:        General: No swelling.     Cervical back: Full passive range of motion without pain, normal range of motion and neck supple. No pain with movement, spinous process tenderness or muscular tenderness. Normal range of motion.     Right lower leg: No edema.     Left lower leg: No edema.  Skin:    General: Skin is warm and dry.     Capillary Refill: Capillary refill takes less than 2 seconds.     Findings: No ecchymosis, erythema, lesion or wound.  Neurological:     Mental Status: He is alert and oriented to person, place, and time.     GCS: GCS eye subscore is 4. GCS verbal subscore is 5. GCS motor subscore is 6.     Cranial Nerves: Cranial nerves 2-12 are intact.     Sensory: Sensation is intact.     Motor: Motor function is intact. No weakness or abnormal muscle tone.     Coordination: Coordination is intact.  Psychiatric:        Mood and Affect: Mood normal.        Speech: Speech normal.        Behavior: Behavior normal.     ED Results / Procedures / Treatments   Labs (all labs ordered are listed, but only abnormal results are displayed) Labs Reviewed  CBC WITH DIFFERENTIAL/PLATELET - Abnormal; Notable for the following components:      Result Value   Eosinophils Absolute 0.6 (*)    Abs Immature Granulocytes 0.10 (*)    All other components within normal limits  BASIC METABOLIC PANEL - Abnormal; Notable for the following components:   Potassium 3.3 (*)    CO2 21 (*)    Glucose, Bld 111 (*)    BUN 28 (*)    All other components within normal limits  RESP PANEL BY RT-PCR (RSV, FLU A&B, COVID)  RVPGX2  BRAIN NATRIURETIC PEPTIDE  TROPONIN I (HIGH SENSITIVITY)  TROPONIN I (HIGH SENSITIVITY)    EKG EKG Interpretation Date/Time:  Thursday January 06 2023 01:41:33 EST Ventricular Rate:  83 PR Interval:  144 QRS Duration:  96 QT Interval:  374 QTC  Calculation: 440 R Axis:   20  Text Interpretation: Sinus rhythm Ventricular premature complex Confirmed by [name] [phone]) on 01/06/2023 1:47:06 AM  Radiology DG Chest Port 1 View  Result Date: 01/06/2023 CLINICAL DATA:  Shortness of breath EXAM: PORTABLE CHEST 1 VIEW COMPARISON:  10/30/2021 FINDINGS: The heart size and mediastinal contours are within normal limits. Right basilar atelectasis/scarring and elevated right hemidiaphragm. Nodule in the right upper lung corresponds  with nodule seen on PET/CT 09/30/2022. The lungs are otherwise clear. The visualized skeletal structures are unremarkable. IMPRESSION: 1. No active disease. 2. Right upper lung nodule corresponds with nodule seen on PET/CT 09/30/2022. Electronically Signed   By: Minerva Fester M.D.   On: 01/06/2023 03:35    Procedures Procedures    Medications Ordered in ED Medications  albuterol (PROVENTIL) (2.5 MG/3ML) 0.083% nebulizer solution (10 mg/hr Nebulization Given 01/06/23 0216)  ipratropium (ATROVENT) nebulizer solution 0.5 mg (0.5 mg Nebulization Given 01/06/23 0216)    ED Course/ Medical Decision Making/ A&P                                 Medical Decision Making Amount and/or Complexity of Data Reviewed Labs: ordered. Decision-making details documented in ED Course. Radiology: ordered and independent interpretation performed. Decision-making details documented in ED Course. ECG/medicine tests: ordered and independent interpretation performed. Decision-making details documented in ED Course.  Risk Prescription drug management.   Differential Diagnosis considered includes, but not limited to: COPD exacerbation; Bronchitis; Pneumonia; CHF; ACS; PE  Patient presents with cough, shortness of breath.  He does have a history of COPD.  Patient with decreased air movement and wheezing apparent on arrival.  Chest x-ray without evidence of pneumonia.  Cardiac evaluation unremarkable.  Negative COVID,  negative influenza, negative RSV.  Patient given continuous nebulizer treatment and has improved.  Room air oxygen saturation is 94, no longer wheezing.  Will discharge with steroids, antibiotics, bronchodilator therapy.  Patient complaining of rib pain from coughing, treat cough with Hycodan.        Final Clinical Impression(s) / ED Diagnoses Final diagnoses:  COPD exacerbation (HCC)    Rx / DC Orders ED Discharge Orders     None         Jahmeek Shirk, [name], MD 01/06/23 [phone]

## 2023-01-06 NOTE — ED Notes (Signed)
Called rt about nebs

## 2023-01-11 ENCOUNTER — Other Ambulatory Visit: Payer: Self-pay | Admitting: Obstetrics and Gynecology

## 2023-01-11 NOTE — Patient Instructions (Signed)
Hi [name], Congratulations on stopping smoking-that is wonderful news!  Have a nice afternoon!  [name] was given information about Medicaid Managed Care team care coordination services as a part of their [facility] Medicaid benefit. [name] verbally consented to engagement with the [facility] Managed Care team.   If you are experiencing a medical emergency, please call 911 or report to your local emergency department or urgent care.   If you have a non-emergency medical problem during routine business hours, please contact your provider's office and ask to speak with a nurse.   For questions related to your [facility] health plan, please call: [phone] or go here:[email or web address]  If you would like to schedule transportation through your [facility] plan, please call the following number at least 2 days in advance of your appointment: [phone].   You can also use the MTM portal or MTM mobile app to manage your rides. Reimbursement for transportation is available through [facility]! For the portal, please go to [email or web address]  Call the [facility] Crisis Line at [phone], at any time, 24 hours a day, 7 days a week. If you are in danger or need immediate medical attention call 911.  If you would like help to quit smoking, call 1-800-QUIT-NOW (785 526 3786) OR Espaol: 1-855-Djelo-Ya (4-132-440-1027) o para ms informacin haga clic aqu or Text READY to 253-664 to register via text  [name] - following are the goals we discussed in your visit today:   Goals Addressed    Timeframe:  Long-Range Goal Priority:  High Start Date:     12/16/21                        Expected End Date:  ongoing                     Follow Up Date: 02/11/23   - schedule appointment for flu shot - schedule appointment for vaccines needed due to my age or health - schedule recommended health tests  - schedule and keep appointment for annual check-up     Why is this important?   Screening tests can find diseases early when they are easier to treat.  Your doctor or nurse will talk with you about which tests are important for you.  Getting shots for common diseases like the flu and shingles will help prevent them.   01/11/23:  PCP appt this week-to f/u on PULM, Surgeon appt  Patient verbalizes understanding of instructions and care plan provided today and agrees to view in MyChart. Active MyChart status and patient understanding of how to access instructions and care plan via MyChart confirmed with patient.     The Managed Medicaid care management team will reach out to the patient again over the next 30 business  days.  The  Patient  has been provided with contact information for the Managed Medicaid care management team and has been advised to call with any health related questions or concerns.   [name] RN, BSN [place]  [facility] Care Management Coordinator - Managed Medicaid High Risk [phone]   Following is a copy of your plan of care:  Care Plan : RN Care Manager Plan of Care  Updates made by [name], RN since 01/11/2023 12:00 AM     Problem: Health Promotion or Disease Self-Management (General Plan of Care)      Long-Range Goal: Chronic Disease Management   Start Date: 12/16/2021  Expected End  Date: 03/12/2023  Priority: High  Note:   Current Barriers:  Knowledge Deficits related to plan of care for management of recent ischemic stroke, arthritis, anxiety/depression, tobacco use, GERD, HTN, h/o DVT Chronic Disease Management support and education needs related to recent ischemic stroke, arthritis, anxiety/depression, tobacco use, GERD, HTN, h/o DVT 01/11/23:  patient stopped smoking-has PCP appt this week and will f/u on PULM and Surgery appt.  Seen in ED 11/28 for COPD.  No breathing issues today.  Does not check BP.  Awaiting mailed housing resources.  RNCM Clinical Goal(s):  Patient will  verbalize understanding of plan for management of ischemic stroke  as evidenced by patient report and follow up  through collaboration with RN Care manager, provider, and care team.   Interventions: Inter-disciplinary care team collaboration (see longitudinal plan of care) Evaluation of current treatment plan related to  self management and patient's adherence to plan as established by provider Patient provided with eye provider information. Collaborated with [name] [name] referral for dental resources, housing resources 01/11/23:  Congratulations given on smoking cessation!  Hypertension Interventions:  (Status:  New goal.) Long Term Goal Last practice recorded BP readings:  BP Readings from Last 3 Encounters:        01/06/23 109/69  05/12/22:            137/100 08/18/22:          134/86  Most recent eGFR/CrCl: No results found for: "EGFR"  No components found for: "CRCL"  Evaluation of current treatment plan related to hypertension self management and patient's adherence to plan as established by provider Reviewed medications with patient and discussed importance of compliance Discussed plans with patient for ongoing care management follow up and provided patient with direct contact information for care management team Reviewed scheduled/upcoming provider appointments including:  Assessed social determinant of health barriers   Stroke:  (Status:New goal.) Long Term Goal Reviewed Importance of taking all medications as prescribed Reviewed Importance of attending all scheduled provider appointments Advised to report any changes in symptoms or exercise tolerance Assessed social determinant of health barriers Assessed for signs and symptoms of stroke Assessed for cognitive impairment Assessed use of tobacco use  Patient Goals/Self-Care Activities: Take all medications as prescribed Attend all scheduled provider appointments Call pharmacy for medication refills 3-7 days in advance of  running out of medications Perform all self care activities independently  Perform IADL's (shopping, preparing meals, housekeeping, managing finances) independently Call provider office for new concerns or questions  01/11/23:  To f/u regarding PULM and Surgeon appt  Follow Up Plan:  The patient has been provided with contact information for the care management team and has been advised to call with any health related questions or concerns.  The care management team will reach out to the patient again over the next 30 business  days.

## 2023-01-11 NOTE — Patient Outreach (Signed)
Medicaid Managed Care   Nurse Care Manager Note  01/11/2023 Name:  [name] MRN:  [id number] DOB:  Mar 15, 1965  [name] is an 57 y.o. year old male who is a primary patient of Clifford Laroche, [name].  The [facility] Managed Care Coordination team was consulted for assistance with:    Chronic healthcare management needs, GERD, asthma, COPD, HTN, h/o CVA, arthritis, chronic hepatitis, anxiety/depression, emphysema, neuropathy, HLD  Mr. Barnish was given information about Medicaid Managed Care Coordination team services today. [name] Patient agreed to services and verbal consent obtained.  Engaged with patient by telephone for follow up visit in response to provider referral for case management and/or care coordination services.   Assessments/Interventions:  Review of past medical history, allergies, medications, health status, including review of consultants reports, laboratory and other test data, was performed as part of comprehensive evaluation and provision of chronic care management services.  SDOH (Social Determinants of Health) assessments and interventions performed: SDOH Interventions    Flowsheet Row Patient Outreach Telephone from 01/11/2023 in [place] [facility] POP[facility]ALTH DEPARTMENT Office Visit from 12/21/2022 in [facility] [place] [facility] Patient Outreach Telephone from 12/10/2022 in [place] POPULATION HEALTH DEPARTMENT Patient Outreach Telephone from 09/16/2022 in [place] POPULATION HEALTH DEPARTMENT Patient Outreach Telephone from 08/16/2022 in [place] POPULATION HEALTH DEPARTMENT Patient Outreach Telephone from 06/15/2022 in [place] POPULATION HEALTH DEPARTMENT  SDOH Interventions        Food Insecurity Interventions -- -- -- -- Intervention Not Indicated --  Housing Interventions -- -- -- -- -- Intervention Not Indicated  Alcohol Usage Interventions -- -- -- -- -- Intervention Not Indicated (Score <7)  Depression Interventions/Treatment  --  Medication, Counseling -- -- -- --  Financial Strain Interventions -- -- Intervention Not Indicated -- -- --  Physical Activity Interventions Intervention Not Indicated -- -- -- -- --  Stress Interventions -- -- Intervention Not Indicated -- -- --  Social Connections Interventions Intervention Not Indicated -- -- -- -- --  Health Literacy Interventions -- -- -- Intervention Not Indicated -- --     Care Plan No Known Allergies  Medications Reviewed Today     Reviewed by [name], RN (Registered Nurse) on 01/11/23 at 1244  Med List Status: <None>   Medication Order Taking? Sig Documenting Provider Last Dose Status Informant  albuterol (PROVENTIL) (2.5 MG/3ML) 0.083% nebulizer solution [id number]  Take 3 mLs (2.5 mg total) by nebulization every 4 (four) hours as needed for wheezing or shortness of breath. Clifford Crease, MD  Active   albuterol (VENTOLIN HFA) 108 (90 Base) MCG/ACT inhaler [id number]  Inhale 2 puffs into the lungs every 4 (four) hours as needed for wheezing or shortness of breath. Clifford Crease, MD  Active   amLODipine (NORVASC) 10 MG tablet [id number]  Take 1 tablet (10 mg total) by mouth daily for 14 days. Clifford Laroche, [name]  Expired 01/04/23 2359   atorvastatin (LIPITOR) 20 MG tablet [id number]  Take 1 tablet (20 mg total) by mouth daily for 14 days. Clifford Laroche, [name]  Expired 01/04/23 2359   docusate sodium (COLACE) 100 MG capsule [id number] No Take 1 capsule (100 mg total) by mouth daily.  Patient not taking: Reported on 11/22/2022   Clifford Inches, MD Not Taking Active Self  doxycycline (VIBRAMYCIN) 100 MG capsule [id number]  Take 1 capsule (100 mg total) by mouth 2 (two) times daily. Clifford Crease, MD  Active   FLUoxetine (PROZAC) 40 MG capsule [id number]  Take 1  capsule (40 mg total) by mouth daily for 14 days. Clifford Laroche, [name]  Expired 01/04/23 2359   HYDROcodone bit-homatropine (HYCODAN) 5-1.5 MG/5ML syrup [id number]  Take 5 mLs by  mouth every 6 (six) hours as needed for cough. Clifford Crease, MD  Active   hydrOXYzine (VISTARIL) 25 MG capsule [id number]  Take 1 capsule (25 mg total) by mouth every 8 (eight) hours as needed. Clifford Laroche, [name]  Active   ipratropium-albuterol (DUONEB) 0.5-2.5 (3) MG/3ML SOLN [id number]  Take 3 mLs by nebulization every 4 (four) hours as needed (wheezing, shortness of breath). Clifford Laroche, [name]  Active   linaclotide [facility]) 290 MCG CAPS capsule [id number] No Take 1 capsule (290 mcg total) by mouth daily before breakfast for 14 days.  Patient not taking: Reported on 11/22/2022   Clifford Inches, MD Not Taking Expired 12/07/22 2359 Self  melatonin 5 MG TABS [id number] No Take 1 tablet (5 mg total) by mouth at bedtime.  Patient not taking: Reported on 11/22/2022   Clifford Inches, MD Not Taking Active Self  naproxen (NAPROSYN) 500 MG tablet [id number]  Take 1 tablet (500 mg total) by mouth 2 (two) times daily with a meal. Clifford Laroche, [name]  Active   pantoprazole (PROTONIX) 40 MG tablet [id number]  Take 1 tablet (40 mg total) by mouth daily. Clifford Laroche, [name]  Active   polyethylene glycol (MIRALAX / GLYCOLAX) 17 g packet [id number] No Take 17 g by mouth 2 (two) times daily.  Patient not taking: Reported on 11/22/2022   Clifford Inches, MD Not Taking Active Self  predniSONE (DELTASONE) 20 MG tablet [id number]  Take 2 tablets (40 mg total) by mouth daily with breakfast. Clifford Crease, MD  Active   risperiDONE (RISPERDAL) 1 MG tablet [id number] No Take 1 tablet (1 mg total) by mouth at bedtime for 14 days.  Patient not taking: Reported on 11/22/2022   Clifford Inches, MD Not Taking Expired 12/06/22 2359 Self  VENTOLIN HFA 108 (90 Base) MCG/ACT inhaler [id number]  Inhale 2 puffs into the lungs every 6 (six) hours as needed for wheezing or shortness of breath. Clifford Laroche, [name]  Active            Patient Active Problem List   Diagnosis Date Noted   Rib pain on  left side 12/21/2022   Anxiety 12/21/2022   Severe episode of recurrent major depressive disorder, with psychotic features (HCC) 11/23/2022   Constipation 11/18/2022   Suicidal ideation 11/11/2022   Severe episode of recurrent major depressive disorder, without psychotic features (HCC) 11/11/2022   Alcohol abuse 11/11/2022   Moderate cocaine use disorder (HCC) 11/11/2022   MDD (major depressive disorder), recurrent severe, without psychosis (HCC) 11/11/2022   Alcohol dependence (HCC) 11/11/2022   History of colonic polyps 09/13/2022   Unilateral inguinal hernia without obstruction or gangrene 09/13/2022   Erectile dysfunction 03/17/2022   Inguinal bulge 03/17/2022   Depression due to old stroke 02/13/2022   Encounter for general adult medical examination with abnormal findings 02/13/2022   Elevated LDL cholesterol level 02/13/2022   Primary hypertension 02/13/2022   Intermittent explosive disorder 11/22/2021   Protein-calorie malnutrition, severe 10/31/2021   Anoxic brain injury (HCC) 10/31/2021   Acute respiratory failure with hypoxia (HCC)    Cerebrovascular accident (CVA) (HCC)    Acute encephalopathy 10/22/2021   Fever    Pleural effusion    Deep venous thrombosis (HCC)    Pain and swelling of left lower leg 12/28/2018   Septic arthritis of shoulder, right (HCC)  09/07/2018   Septic arthritis of sacroiliac joint (HCC) 09/07/2018   Sternoclavicular joint pain, left 09/07/2018   Discitis of lumbosacral region 06/28/2018   Psoas abscess, right (HCC) 06/28/2018   Polysubstance abuse (HCC) 06/24/2018   Normocytic anemia 06/24/2018   Recurrent boils 06/24/2018   Head injury    Septic arthritis of knee, left (HCC)    Acute medial meniscal tear, left, initial encounter    GERD (gastroesophageal reflux disease) 06/22/2018   Mild protein-calorie malnutrition (HCC) 06/22/2018   AKI (acute kidney injury) (HCC) 06/21/2018   Spinal stenosis of lumbosacral region 06/21/2018   Chronic  hepatitis C (HCC) 12/17/2013   Cigarette smoker 12/17/2013   Family history of diabetes mellitus (DM) 12/17/2013   Notalgia 12/17/2013   Conditions to be addressed/monitored per PCP order:  Chronic healthcare management needs, GERD, asthma, COPD, HTN, h/o CVA, arthritis, chronic hepatitis, anxiety/depression, emphysema, neuropathy, HLD  Care Plan : RN Care Manager Plan of Care  Updates made by [name], RN since 01/11/2023 12:00 AM     Problem: Health Promotion or Disease Self-Management (General Plan of Care)      Long-Range Goal: Chronic Disease Management   Start Date: 12/16/2021  Expected End Date: 03/12/2023  Priority: High  Note:   Current Barriers:  Knowledge Deficits related to plan of care for management of recent ischemic stroke, arthritis, anxiety/depression, tobacco use, GERD, HTN, h/o DVT Chronic Disease Management support and education needs related to recent ischemic stroke, arthritis, anxiety/depression, tobacco use, GERD, HTN, h/o DVT 01/11/23:  patient stopped smoking-has PCP appt this week and will f/u on PULM and Surgery appt.  Seen in ED 11/28 for COPD.  No breathing issues today.  Does not check BP.  Awaiting mailed housing resources.  RNCM Clinical Goal(s):  Patient will verbalize understanding of plan for management of ischemic stroke  as evidenced by patient report and follow up  through collaboration with RN Care manager, provider, and care team.   Interventions: Inter-disciplinary care team collaboration (see longitudinal plan of care) Evaluation of current treatment plan related to  self management and patient's adherence to plan as established by provider Patient provided with eye provider information. Collaborated with [name] [name] referral for dental resources, housing resources 01/11/23:  Congratulations given on smoking cessation!  Hypertension Interventions:  (Status:  New goal.) Long Term Goal Last practice recorded BP readings:  BP Readings from  Last 3 Encounters:        01/06/23 109/69  05/12/22:            137/100 08/18/22:          134/86  Most recent eGFR/CrCl: No results found for: "EGFR"  No components found for: "CRCL"  Evaluation of current treatment plan related to hypertension self management and patient's adherence to plan as established by provider Reviewed medications with patient and discussed importance of compliance Discussed plans with patient for ongoing care management follow up and provided patient with direct contact information for care management team Reviewed scheduled/upcoming provider appointments including:  Assessed social determinant of health barriers   Stroke:  (Status:New goal.) Long Term Goal Reviewed Importance of taking all medications as prescribed Reviewed Importance of attending all scheduled provider appointments Advised to report any changes in symptoms or exercise tolerance Assessed social determinant of health barriers Assessed for signs and symptoms of stroke Assessed for cognitive impairment Assessed use of tobacco use  Patient Goals/Self-Care Activities: Take all medications as prescribed Attend all scheduled provider appointments Call pharmacy for medication  refills 3-7 days in advance of running out of medications Perform all self care activities independently  Perform IADL's (shopping, preparing meals, housekeeping, managing finances) independently Call provider office for new concerns or questions  01/11/23:  To f/u regarding PULM and Surgeon appt  Follow Up Plan:  The patient has been provided with contact information for the care management team and has been advised to call with any health related questions or concerns.  The care management team will reach out to the patient again over the next 30 business  days.    Long-Range Goal: Establish Plan of Care for Chronic Disease Managedment Needs   Priority: High  Note:   Timeframe:  Long-Range Goal Priority:  High Start  Date:     12/16/21                        Expected End Date:  ongoing                     Follow Up Date: 02/11/23   - schedule appointment for flu shot - schedule appointment for vaccines needed due to my age or health - schedule recommended health tests  - schedule and keep appointment for annual check-up    Why is this important?   Screening tests can find diseases early when they are easier to treat.  Your doctor or nurse will talk with you about which tests are important for you.  Getting shots for common diseases like the flu and shingles will help prevent them.   01/11/23:  PCP appt this week-to f/u on PULM, Surgeon appt   Follow Up:  Patient agrees to Care Plan and Follow-up.  Plan: The Managed Medicaid care management team will reach out to the patient again over the next 30 business  days. and The  Patient has been provided with contact information for the Managed Medicaid care management team and has been advised to call with any health related questions or concerns.  Date/time of next scheduled RN care management/care coordination outreach: 02/11/23 at 230

## 2023-01-13 ENCOUNTER — Ambulatory Visit: Payer: Medicaid Other | Admitting: Family Medicine

## 2023-01-19 ENCOUNTER — Encounter: Payer: Self-pay | Admitting: Family Medicine

## 2023-01-20 ENCOUNTER — Other Ambulatory Visit: Payer: Self-pay

## 2023-01-20 NOTE — Patient Outreach (Signed)
Medicaid Managed Care Social Work Note  01/20/2023 Name:  [name] MRN:  [id number] DOB:  1965/07/20  [name] is an 57 y.o. year old male who is a primary patient of Clifford Laroche, [name].  The [facility] Managed Care Coordination team was consulted for assistance with:  Community Resources   [name] was given information about Medicaid Managed Care Coordination team services today. [name] Patient agreed to services and verbal consent obtained.  Engaged with patient  for by telephone forfollow up visit in response to referral for case management and/or care coordination services.   Patient is participating in a Managed Medicaid Plan:  Yes  Assessments/Interventions:  Review of past medical history, allergies, medications, health status, including review of consultants reports, laboratory and other test data, was performed as part of comprehensive evaluation and provision of chronic care management services.  SDOH: (Social Drivers of Health) assessments and interventions performed: SDOH Interventions    Flowsheet Row Patient Outreach Telephone from 01/11/2023 in [place] [facility] POPULATION HEALTH DEPARTMENT Office Visit from 12/21/2022 in [facility] [place] [facility] Patient Outreach Telephone from 12/10/2022 in [place] POPULATION HEALTH DEPARTMENT Patient Outreach Telephone from 09/16/2022 in [place] POPULATION HEALTH DEPARTMENT Patient Outreach Telephone from 08/16/2022 in [place] POPULATION HEALTH DEPARTMENT Patient Outreach Telephone from 06/15/2022 in  POPULATION HEALTH DEPARTMENT  SDOH Interventions        Food Insecurity Interventions -- -- -- -- Intervention Not Indicated --  Housing Interventions -- -- -- -- -- Intervention Not Indicated  Alcohol Usage Interventions -- -- -- -- -- Intervention Not Indicated (Score <7)  Depression Interventions/Treatment  -- Medication, Counseling -- -- -- --  Financial Strain Interventions -- -- Intervention Not  Indicated -- -- --  Physical Activity Interventions Intervention Not Indicated -- -- -- -- --  Stress Interventions -- -- Intervention Not Indicated -- -- --  Social Connections Interventions Intervention Not Indicated -- -- -- -- --  Health Literacy Interventions -- -- -- Intervention Not Indicated -- --     [name] completed a telephone outreach with patient, he states he still did not get the resources [name] sent, he states he is not homeless but close to it. Patient stated he does have an email the resources can be sent to at [name]. [name] emailed resources to patient.  Advanced Directives Status:  Not addressed in this encounter.  Care Plan                 No Known Allergies  Medications Reviewed Today   Medications were not reviewed in this encounter     Patient Active Problem List   Diagnosis Date Noted   Rib pain on left side 12/21/2022   Anxiety 12/21/2022   Severe episode of recurrent major depressive disorder, with psychotic features (HCC) 11/23/2022   Constipation 11/18/2022   Suicidal ideation 11/11/2022   Severe episode of recurrent major depressive disorder, without psychotic features (HCC) 11/11/2022   Alcohol abuse 11/11/2022   Moderate cocaine use disorder (HCC) 11/11/2022   MDD (major depressive disorder), recurrent severe, without psychosis (HCC) 11/11/2022   Alcohol dependence (HCC) 11/11/2022   History of colonic polyps 09/13/2022   Unilateral inguinal hernia without obstruction or gangrene 09/13/2022   Erectile dysfunction 03/17/2022   Inguinal bulge 03/17/2022   Depression due to old stroke 02/13/2022   Encounter for general adult medical examination with abnormal findings 02/13/2022   Elevated LDL cholesterol level 02/13/2022   Primary hypertension 02/13/2022   Intermittent explosive disorder  11/22/2021   Protein-calorie malnutrition, severe 10/31/2021   Anoxic brain injury (HCC) 10/31/2021   Acute respiratory failure with hypoxia (HCC)     Cerebrovascular accident (CVA) (HCC)    Acute encephalopathy 10/22/2021   Fever    Pleural effusion    Deep venous thrombosis (HCC)    Pain and swelling of left lower leg 12/28/2018   Septic arthritis of shoulder, right (HCC) 09/07/2018   Septic arthritis of sacroiliac joint (HCC) 09/07/2018   Sternoclavicular joint pain, left 09/07/2018   Discitis of lumbosacral region 06/28/2018   Psoas abscess, right (HCC) 06/28/2018   Polysubstance abuse (HCC) 06/24/2018   Normocytic anemia 06/24/2018   Recurrent boils 06/24/2018   Head injury    Septic arthritis of knee, left (HCC)    Acute medial meniscal tear, left, initial encounter    GERD (gastroesophageal reflux disease) 06/22/2018   Mild protein-calorie malnutrition (HCC) 06/22/2018   AKI (acute kidney injury) (HCC) 06/21/2018   Spinal stenosis of lumbosacral region 06/21/2018   Chronic hepatitis C (HCC) 12/17/2013   Cigarette smoker 12/17/2013   Family history of diabetes mellitus (DM) 12/17/2013   Notalgia 12/17/2013    Conditions to be addressed/monitored per PCP order:   housing resources  There are no care plans that you recently modified to display for this patient.   Follow up:  Patient agrees to Care Plan and Follow-up.  Plan: The Managed Medicaid care management team will reach out to the patient again over the next 15 days.  Date/time of next scheduled Social Work care management/care coordination outreach:  02/11/23  [name], [name], [facility]  Managed [facility] Social Worker [phone]

## 2023-01-20 NOTE — Patient Instructions (Signed)
Visit Information  [name] was given information about Medicaid Managed Care team care coordination services as a part of their [facility] Medicaid benefit. [name] verbally consented to engagement with the [facility] Managed Care team.   If you are experiencing a medical emergency, please call 911 or report to your local emergency department or urgent care.   If you have a non-emergency medical problem during routine business hours, please contact your provider's office and ask to speak with a nurse.   For questions related to your [facility] health plan, please call: [phone] or go here:[email or web address]  If you would like to schedule transportation through your [facility] plan, please call the following number at least 2 days in advance of your appointment: [phone].   You can also use the MTM portal or MTM mobile app to manage your rides. Reimbursement for transportation is available through [facility]! For the portal, please go to [email or web address]  Call the [facility] Crisis Line at [phone], at any time, 24 hours a day, 7 days a week. If you are in danger or need immediate medical attention call 911.  If you would like help to quit smoking, call 1-800-QUIT-NOW (224-219-5384) OR Espaol: 1-855-Djelo-Ya (4-742-595-6387) o para ms informacin haga clic aqu or Text READY to 564-332 to register via text  Mr. Zani - following are the goals we discussed in your visit today:   Goals Addressed   None      Social Worker will follow up in 15 days.   [name], [name], MHA [facility] Health  Managed Medicaid Social Worker [phone]   Following is a copy of your plan of care:  There are no care plans that you recently modified to display for this patient.

## 2023-01-21 ENCOUNTER — Ambulatory Visit: Payer: Medicaid Other | Admitting: Family Medicine

## 2023-02-04 ENCOUNTER — Emergency Department (HOSPITAL_COMMUNITY)
Admission: EM | Admit: 2023-02-04 | Discharge: 2023-02-04 | Disposition: A | Discharge status: Home / Self Care | Payer: Medicaid Other | Attending: Emergency Medicine | Admitting: Emergency Medicine

## 2023-02-04 ENCOUNTER — Other Ambulatory Visit: Payer: Self-pay

## 2023-02-04 ENCOUNTER — Encounter (HOSPITAL_COMMUNITY): Payer: Self-pay | Admitting: Emergency Medicine

## 2023-02-04 ENCOUNTER — Emergency Department (HOSPITAL_COMMUNITY): Payer: Medicaid Other

## 2023-02-04 DIAGNOSIS — S0992XA Unspecified injury of nose, initial encounter: Secondary | ICD-10-CM | POA: Diagnosis present

## 2023-02-04 DIAGNOSIS — R55 Syncope and collapse: Secondary | ICD-10-CM

## 2023-02-04 DIAGNOSIS — S0031XA Abrasion of nose, initial encounter: Secondary | ICD-10-CM | POA: Diagnosis not present

## 2023-02-04 DIAGNOSIS — W01198A Fall on same level from slipping, tripping and stumbling with subsequent striking against other object, initial encounter: Secondary | ICD-10-CM | POA: Diagnosis not present

## 2023-02-04 DIAGNOSIS — Z79899 Other long term (current) drug therapy: Secondary | ICD-10-CM | POA: Diagnosis not present

## 2023-02-04 DIAGNOSIS — R0789 Other chest pain: Secondary | ICD-10-CM

## 2023-02-04 DIAGNOSIS — R062 Wheezing: Secondary | ICD-10-CM

## 2023-02-04 DIAGNOSIS — I951 Orthostatic hypotension: Secondary | ICD-10-CM | POA: Insufficient documentation

## 2023-02-04 DIAGNOSIS — S0990XA Unspecified injury of head, initial encounter: Secondary | ICD-10-CM | POA: Insufficient documentation

## 2023-02-04 DIAGNOSIS — I10 Essential (primary) hypertension: Secondary | ICD-10-CM | POA: Insufficient documentation

## 2023-02-04 LAB — COMPREHENSIVE METABOLIC PANEL
ALT: 27 U/L (ref 0–44)
AST: 17 U/L (ref 15–41)
Albumin: 3.9 g/dL (ref 3.5–5.0)
Alkaline Phosphatase: 88 U/L (ref 38–126)
Anion gap: 10 (ref 5–15)
BUN: 25 mg/dL — ABNORMAL HIGH (ref 6–20)
CO2: 25 mmol/L (ref 22–32)
Calcium: 9.5 mg/dL (ref 8.9–10.3)
Chloride: 102 mmol/L (ref 98–111)
Creatinine, Ser: 0.92 mg/dL (ref 0.61–1.24)
GFR, Estimated: >60 mL/min (ref >60)
Glucose, Bld: 113 mg/dL — ABNORMAL HIGH (ref 70–99)
Potassium: 4.1 mmol/L (ref 3.5–5.1)
Sodium: 137 mmol/L (ref 135–145)
Total Bilirubin: 0.5 mg/dL (ref <1.2)
Total Protein: 7.1 g/dL (ref 6.5–8.1)

## 2023-02-04 LAB — CBC WITH DIFFERENTIAL/PLATELET
Abs Immature Granulocytes: 0.17 10*3/uL — ABNORMAL HIGH (ref 0.00–0.07)
Basophils Absolute: 0.1 10*3/uL (ref 0.0–0.1)
Basophils Relative: 1 %
Eosinophils Absolute: 0.3 10*3/uL (ref 0.0–0.5)
Eosinophils Relative: 3 %
HCT: 42.3 % (ref 39.0–52.0)
Hemoglobin: 13.9 g/dL (ref 13.0–17.0)
Immature Granulocytes: 2 %
Lymphocytes Relative: 22 %
Lymphs Abs: 2.5 10*3/uL (ref 0.7–4.0)
MCH: 31.1 pg (ref 26.0–34.0)
MCHC: 32.9 g/dL (ref 30.0–36.0)
MCV: 94.6 fL (ref 80.0–100.0)
Monocytes Absolute: 0.8 10*3/uL (ref 0.1–1.0)
Monocytes Relative: 7 %
Neutro Abs: 7.4 10*3/uL (ref 1.7–7.7)
Neutrophils Relative %: 65 %
Platelets: 335 10*3/uL (ref 150–400)
RBC: 4.47 MIL/uL (ref 4.22–5.81)
RDW: 13.5 % (ref 11.5–15.5)
WBC: 11.3 10*3/uL — ABNORMAL HIGH (ref 4.0–10.5)
nRBC: 0 % (ref 0.0–0.2)

## 2023-02-04 LAB — TROPONIN I (HIGH SENSITIVITY)
Troponin I (High Sensitivity): 3 ng/L (ref <18)
Troponin I (High Sensitivity): 3 ng/L (ref <18)

## 2023-02-04 LAB — D-DIMER, QUANTITATIVE: D-Dimer, Quant: 0.49 ug{FEU}/mL (ref 0.00–0.50)

## 2023-02-04 LAB — ETHANOL: Alcohol, Ethyl (B): <10 mg/dL (ref <10)

## 2023-02-04 LAB — CBG MONITORING, ED: Glucose-Capillary: 146 mg/dL — ABNORMAL HIGH (ref 70–99)

## 2023-02-04 MED ORDER — DEXAMETHASONE SODIUM PHOSPHATE 10 MG/ML IJ SOLN
10.0000 mg | Freq: Once | INTRAMUSCULAR | Status: AC
  Administered 2023-02-04: 10 mg via INTRAVENOUS

## 2023-02-04 MED ORDER — SODIUM CHLORIDE 0.9 % IV BOLUS
500.0000 mL | Freq: Once | INTRAVENOUS | Status: AC
  Administered 2023-02-04: 500 mL via INTRAVENOUS

## 2023-02-04 MED ORDER — IPRATROPIUM-ALBUTEROL 0.5-2.5 (3) MG/3ML IN SOLN
3.0000 mL | Freq: Once | RESPIRATORY_TRACT | Status: AC
  Administered 2023-02-04: 3 mL via RESPIRATORY_TRACT
  Filled 2023-02-04: qty 3

## 2023-02-04 MED ORDER — LORAZEPAM 1 MG PO TABS
1.0000 mg | ORAL_TABLET | Freq: Once | ORAL | Status: AC
  Administered 2023-02-04: 1 mg via ORAL
  Filled 2023-02-04: qty 1

## 2023-02-04 MED ORDER — DEXAMETHASONE SODIUM PHOSPHATE 10 MG/ML IJ SOLN
10.0000 mg | Freq: Once | INTRAMUSCULAR | Status: DC
  Filled 2023-02-04: qty 1

## 2023-02-04 NOTE — ED Notes (Signed)
Pt ambulated to outside of room 7 to outside of room 2 with O2 sat ranging from 95-97% and HR of 87-89bpm. Pt tolerated it well.

## 2023-02-04 NOTE — Discharge Instructions (Addendum)
You were seen today for multiple symptoms including passing out, chest pain, dizziness.  You do appear slightly dehydrated.  Make sure to hydrate well at home.  Your workup for your chest pain is reassuring.  Continue nebulizer as needed for any shortness of breath.  Follow-up closely with your primary doctor.

## 2023-02-04 NOTE — ED Provider Notes (Addendum)
[place] EMERGENCY DEPARTMENT AT [facility] Provider Note   CSN: [id number] Arrival date & time: 02/04/23  0206     History  Chief Complaint  Patient presents with   Loss of Consciousness    [name] is a 57 y.o. male.  HPI     This is a 57 year old male who presents with multiple episodes of loss of consciousness.  Patient reports that he feels dizzy and then will pass out.  He often feels dizzy when he stands up.  He is concerned he may be anemic.  He states he has a history of recurrent blood draws and was told he was anemic in the past.  He states during 1 of these episodes he fell forward hitting his face and had a laceration on the nose and multiple teeth knocked out.  He is also concerned his blood pressure might be high.  He reports chest tightness and shortness of breath.  Does use an inhaler at home.  No recent fevers or cough.  Home Medications Prior to Admission medications   Medication Sig Start Date End Date Taking? Authorizing Provider  albuterol (PROVENTIL) (2.5 MG/3ML) 0.083% nebulizer solution Take 3 mLs (2.5 mg total) by nebulization every 4 (four) hours as needed for wheezing or shortness of breath. 01/06/23   [name], MD  albuterol (VENTOLIN HFA) 108 (90 Base) MCG/ACT inhaler Inhale 2 puffs into the lungs every 4 (four) hours as needed for wheezing or shortness of breath. 01/06/23   [name], MD  amLODipine (NORVASC) 10 MG tablet Take 1 tablet (10 mg total) by mouth daily for 14 days. 12/21/22 01/04/23  [name], [name]  atorvastatin (LIPITOR) 20 MG tablet Take 1 tablet (20 mg total) by mouth daily for 14 days. 12/21/22 01/04/23  [name], [name]  docusate sodium (COLACE) 100 MG capsule Take 1 capsule (100 mg total) by mouth daily. Patient not taking: Reported on 11/22/2022 11/23/22   [name], MD  doxycycline (VIBRAMYCIN) 100 MG capsule Take 1 capsule (100 mg total) by mouth 2 (two) times daily.  01/06/23   [name], MD  FLUoxetine (PROZAC) 40 MG capsule Take 1 capsule (40 mg total) by mouth daily for 14 days. 12/21/22 01/04/23  [name], [name]  HYDROcodone bit-homatropine (HYCODAN) 5-1.5 MG/5ML syrup Take 5 mLs by mouth every 6 (six) hours as needed for cough. 01/06/23   [name], MD  hydrOXYzine (VISTARIL) 25 MG capsule Take 1 capsule (25 mg total) by mouth every 8 (eight) hours as needed. 12/21/22   [name], [name]  ipratropium-albuterol (DUONEB) 0.5-2.5 (3) MG/3ML SOLN Take 3 mLs by nebulization every 4 (four) hours as needed (wheezing, shortness of breath). 12/21/22   [name], [name]  linaclotide (LINZESS) 290 MCG CAPS capsule Take 1 capsule (290 mcg total) by mouth daily before breakfast for 14 days. Patient not taking: Reported on 11/22/2022 11/23/22 12/07/22  [name], MD  melatonin 5 MG TABS Take 1 tablet (5 mg total) by mouth at bedtime. Patient not taking: Reported on 11/22/2022 11/22/22   [name], MD  naproxen (NAPROSYN) 500 MG tablet Take 1 tablet (500 mg total) by mouth 2 (two) times daily with a meal. 12/21/22   [name], [name]  pantoprazole (PROTONIX) 40 MG tablet Take 1 tablet (40 mg total) by mouth daily. 12/21/22   [name], [name]  polyethylene glycol (MIRALAX / GLYCOLAX) 17 g packet Take 17 g by mouth 2 (two) times daily. Patient not taking: Reported  on 11/22/2022 11/22/22   [name], MD  predniSONE (DELTASONE) 20 MG tablet Take 2 tablets (40 mg total) by mouth daily with breakfast. 01/06/23   [name], [name], MD  risperiDONE (RISPERDAL) 1 MG tablet Take 1 tablet (1 mg total) by mouth at bedtime for 14 days. Patient not taking: Reported on 11/22/2022 11/22/22 12/06/22  [name], MD  VENTOLIN HFA 108 (90 Base) MCG/ACT inhaler Inhale 2 puffs into the lungs every 6 (six) hours as needed for wheezing or shortness of breath. 12/21/22   [name], [name]      Allergies     Patient has no known allergies.    Review of Systems   Review of Systems  Constitutional:  Negative for fever.  Respiratory:  Positive for shortness of breath.   Cardiovascular:  Positive for chest pain.  Neurological:  Positive for dizziness and syncope.  All other systems reviewed and are negative.   Physical Exam Updated Vital Signs BP 117/72   Pulse 72   Temp (!) 97.2 F (36.2 C)   Resp 10   Ht 1.778 m (5\' 10" )   Wt 86.2 kg   SpO2 92%   BMI 27.26 kg/m  Physical Exam Vitals and nursing note reviewed.  Constitutional:      Appearance: Normal appearance. He is well-developed. He is not ill-appearing.  HENT:     Head: Normocephalic and atraumatic.     Nose:     Comments: Abrasion over the bridge of the nose    Mouth/Throat:     Mouth: Mucous membranes are dry.  Eyes:     Pupils: Pupils are equal, round, and reactive to light.  Cardiovascular:     Rate and Rhythm: Normal rate and regular rhythm.     Heart sounds: Normal heart sounds. No murmur heard. Pulmonary:     Effort: Pulmonary effort is normal. No respiratory distress.     Breath sounds: Wheezing present.  Abdominal:     General: Bowel sounds are normal.     Palpations: Abdomen is soft.     Tenderness: There is no abdominal tenderness. There is no rebound.  Musculoskeletal:     Cervical back: Neck supple.  Lymphadenopathy:     Cervical: No cervical adenopathy.  Skin:    General: Skin is warm and dry.  Neurological:     Mental Status: He is alert and oriented to person, place, and time.     Comments: Cranial nerves II through XII intact, 5 out of 5 strength in all 4 extremities  Psychiatric:        Mood and Affect: Mood normal.     ED Results / Procedures / Treatments   Labs (all labs ordered are listed, but only abnormal results are displayed) Labs Reviewed  CBC WITH DIFFERENTIAL/PLATELET - Abnormal; Notable for the following components:      Result Value   WBC 11.3 (*)    Abs Immature  Granulocytes 0.17 (*)    All other components within normal limits  COMPREHENSIVE METABOLIC PANEL - Abnormal; Notable for the following components:   Glucose, Bld 113 (*)    BUN 25 (*)    All other components within normal limits  CBG MONITORING, ED - Abnormal; Notable for the following components:   Glucose-Capillary 146 (*)    All other components within normal limits  ETHANOL  D-DIMER, QUANTITATIVE  TROPONIN I (HIGH SENSITIVITY)  TROPONIN I (HIGH SENSITIVITY)    EKG EKG Interpretation Date/Time:  Friday February 04 2023 02:50:53  EST Ventricular Rate:  85 PR Interval:  159 QRS Duration:  94 QT Interval:  383 QTC Calculation: 456 R Axis:   22  Text Interpretation: Sinus rhythm Confirmed by Ross Marcus ([id number]) on 02/04/2023 3:38:21 AM  Radiology CT Head Wo Contrast Result Date: 02/04/2023 CLINICAL DATA:  Syncopal episodes. EXAM: CT HEAD WITHOUT CONTRAST TECHNIQUE: Contiguous axial images were obtained from the base of the skull through the vertex without intravenous contrast. RADIATION DOSE REDUCTION: This exam was performed according to the departmental dose-optimization program which includes automated exposure control, adjustment of the mA and/or kV according to patient size and/or use of iterative reconstruction technique. COMPARISON:  December 30, 2021 FINDINGS: Brain: There is mild cerebral atrophy with widening of the extra-axial spaces and ventricular dilatation. There are areas of decreased attenuation within the white matter tracts of the supratentorial brain, consistent with microvascular disease changes. Chronic right frontal lobe and anterior left temporal lobe infarcts are seen. Vascular: No hyperdense vessel or unexpected calcification. Skull: Normal. Negative for fracture or focal lesion. Sinuses/Orbits: No acute finding. Other: None. IMPRESSION: 1. Generalized cerebral atrophy with chronic white matter small vessel ischemic changes. 2. Chronic right frontal lobe  and anterior left temporal lobe infarcts. 3. No acute intracranial abnormality. Electronically Signed   By: Aram Candela M.D.   On: 02/04/2023 03:44   DG Chest Portable 1 View Result Date: 02/04/2023 CLINICAL DATA:  Chest pain EXAM: PORTABLE CHEST 1 VIEW COMPARISON:  01/06/2023.  Chest CT 09/01/2022.  PET CT 09/30/2022. FINDINGS: Heart and mediastinal contours within normal limits. Right base atelectasis or scarring. Nodular density in the right upper lobe again noted, previously evaluated by PET CT. No confluent opacity on the left. No effusions or acute bony abnormality. IMPRESSION: Stable right upper lobe nodule evaluated by prior PET CT. Right base atelectasis or scarring. Electronically Signed   By: Charlett Nose M.D.   On: 02/04/2023 03:20    Procedures .Critical Care  Performed by: Shon Baton, MD Authorized by: Shon Baton, MD   Critical care provider statement:    Critical care time (minutes):  31   Critical care was necessary to treat or prevent imminent or life-threatening deterioration of the following conditions: IV fluids, repeat respiratory assessments.   Critical care was time spent personally by me on the following activities:  Development of treatment plan with patient or surrogate, discussions with consultants, evaluation of patient's response to treatment, examination of patient, ordering and review of laboratory studies, ordering and review of radiographic studies, ordering and performing treatments and interventions, pulse oximetry, re-evaluation of patient's condition and review of old charts     Medications Ordered in ED Medications  dexamethasone (DECADRON) injection 10 mg (has no administration in time range)  sodium chloride 0.9 % bolus 500 mL (0 mLs Intravenous Stopped 02/04/23 0548)  ipratropium-albuterol (DUONEB) 0.5-2.5 (3) MG/3ML nebulizer solution 3 mL (3 mLs Nebulization Given 02/04/23 0606)  LORazepam (ATIVAN) tablet 1 mg (1 mg Oral Given  02/04/23 0644)    ED Course/ Medical Decision Making/ A&P Clinical Course as of 02/04/23 0655  Fri Feb 04, 2023  9562 Patient reports he is very anxious about symptoms.  He is clinically stable.  Breath sounds with occasional wheeze but good air movement.  Informed of lab delay. [CH]    Clinical Course User Index [CH] Conard Alvira, Mayer Masker, MD  Medical Decision Making Amount and/or Complexity of Data Reviewed Labs: ordered. Radiology: ordered.  Risk Prescription drug management.   This patient presents to the ED for concern of syncope, chest pain, this involves an extensive number of treatment options, and is a complaint that carries with it a high risk of complications and morbidity.  I considered the following differential and admission for this acute, potentially life threatening condition.  The differential diagnosis includes vasovagal syncope, arrhythmia, acute illness, COPD, hypoxia, acute traumatic injury  MDM:    This is a 57 year old male who presents with multiple complaints.  Primarily syncope but also reports chest discomfort and shortness of breath.  He appears very anxious regarding his symptoms.  Vital signs are reassuring with the exception of low normal oxygen saturations.  He does have an occasional wheeze on exam.  EKG shows no evidence of acute ischemia or arrhythmia.  CBC shows no evidence of anemia.  CMP without significant metabolic derangement.  EtOH negative.  CT head without bleed or traumatic injury.  Patient did get dizzy upon standing and his heart rate jumped almost 20 points.  This could indicate some orthostasis.  He was given fluids.  Patient was given a DuoNeb and Decadron for his wheezing on exam which can be contributing to his overall discomfort and shortness of breath.  D-dimer is negative so doubt PE.  6:55 AM Repeat troponin negative.  Will ambulate patient with pulse ox.  If he maintains his pulse ox, he can be  discharged home.  Presentation likely multifactorial but given his orthostasis he is likely clinically dry.  Encouraged aggressive hydration at home and follow-up with primary physician.  He should continue his inhalers and nebulizers for wheezing although he shows no respiratory distress.  (Labs, imaging, consults)  Labs: I Ordered, and personally interpreted labs.  The pertinent results include: CBC, CMP, EtOH, troponin, D-dimer  Imaging Studies ordered: I ordered imaging studies including chest x-ray, CT head I independently visualized and interpreted imaging. I agree with the radiologist interpretation  Additional history obtained from chart review.  External records from outside source obtained and reviewed including prior evaluations  Cardiac Monitoring: The patient was maintained on a cardiac monitor.  If on the cardiac monitor, I personally viewed and interpreted the cardiac monitored which showed an underlying rhythm of: Sinus  Reevaluation: After the interventions noted above, I reevaluated the patient and found that they have :improved  Social Determinants of Health:  lives independently  Disposition: Pending troponin  Co morbidities that complicate the patient evaluation  Past Medical History:  Diagnosis Date   Acid reflux    Emphysema of lung (HCC)    Head injury    Hepatitis C    S/p treatment with Epclusa in 2020   Hiatal hernia    HTN (hypertension)    Pulmonary nodules    Stroke (HCC)      Medicines Meds ordered this encounter  Medications   sodium chloride 0.9 % bolus 500 mL   ipratropium-albuterol (DUONEB) 0.5-2.5 (3) MG/3ML nebulizer solution 3 mL   LORazepam (ATIVAN) tablet 1 mg   dexamethasone (DECADRON) injection 10 mg    I have reviewed the patients home medicines and have made adjustments as needed  Problem List / ED Course: Problem List Items Addressed This Visit   None Visit Diagnoses       Orthostasis    -  Primary     Syncope,  unspecified syncope type  Atypical chest pain         Wheezing                       Final Clinical Impression(s) / ED Diagnoses Final diagnoses:  Orthostasis  Syncope, unspecified syncope type  Atypical chest pain  Wheezing    Rx / DC Orders ED Discharge Orders     None         Elmon Shader, Mayer Masker, MD 02/04/23 1610    Shon Baton, MD 02/04/23 [phone]

## 2023-02-04 NOTE — ED Notes (Signed)
Patient called out reporting he was feeling short of breath and requesting a breathing treatment. RN at bedside to assess patient; Patient HOB elevated. SpO2 98% on room air. Provider notified

## 2023-02-04 NOTE — ED Notes (Signed)
Patient transported to CT 

## 2023-02-04 NOTE — ED Triage Notes (Signed)
Patient reports multiple episodes of syncope, the first episode being a week ago, he fell face forward, has a healing lac to the nose, knocked out his front teeth. Then tonight he had another episode, but was laying down while it happened. Patient reports he is hypertensive, and usually takes his blood pressure medication and his symptoms improved, also stating he is anemic as well. Reports tightness of the chest around lunch time, [facility] as well. Aox4.

## 2023-02-07 ENCOUNTER — Emergency Department (HOSPITAL_COMMUNITY)
Admission: EM | Admit: 2023-02-07 | Discharge: 2023-02-07 | Disposition: A | Discharge status: Home / Self Care | Payer: Medicaid Other | Attending: Emergency Medicine | Admitting: Emergency Medicine

## 2023-02-07 ENCOUNTER — Encounter (HOSPITAL_COMMUNITY): Payer: Self-pay

## 2023-02-07 ENCOUNTER — Other Ambulatory Visit: Payer: Self-pay

## 2023-02-07 DIAGNOSIS — R0602 Shortness of breath: Secondary | ICD-10-CM | POA: Insufficient documentation

## 2023-02-07 DIAGNOSIS — F1410 Cocaine abuse, uncomplicated: Secondary | ICD-10-CM

## 2023-02-07 DIAGNOSIS — F419 Anxiety disorder, unspecified: Secondary | ICD-10-CM | POA: Diagnosis not present

## 2023-02-07 DIAGNOSIS — R55 Syncope and collapse: Secondary | ICD-10-CM | POA: Insufficient documentation

## 2023-02-07 LAB — CBC WITH DIFFERENTIAL/PLATELET
Abs Immature Granulocytes: 0.3 10*3/uL — ABNORMAL HIGH (ref 0.00–0.07)
Basophils Absolute: 0.1 10*3/uL (ref 0.0–0.1)
Basophils Relative: 1 %
Eosinophils Absolute: 0.3 10*3/uL (ref 0.0–0.5)
Eosinophils Relative: 3 %
HCT: 42.8 % (ref 39.0–52.0)
Hemoglobin: 14.1 g/dL (ref 13.0–17.0)
Immature Granulocytes: 3 %
Lymphocytes Relative: 30 %
Lymphs Abs: 2.9 10*3/uL (ref 0.7–4.0)
MCH: 31.3 pg (ref 26.0–34.0)
MCHC: 32.9 g/dL (ref 30.0–36.0)
MCV: 94.9 fL (ref 80.0–100.0)
Monocytes Absolute: 0.6 10*3/uL (ref 0.1–1.0)
Monocytes Relative: 7 %
Neutro Abs: 5.4 10*3/uL (ref 1.7–7.7)
Neutrophils Relative %: 56 %
Platelets: 336 10*3/uL (ref 150–400)
RBC: 4.51 MIL/uL (ref 4.22–5.81)
RDW: 13.8 % (ref 11.5–15.5)
WBC: 9.6 10*3/uL (ref 4.0–10.5)
nRBC: 0 % (ref 0.0–0.2)

## 2023-02-07 LAB — RAPID URINE DRUG SCREEN, HOSP PERFORMED
Amphetamines: NOT DETECTED
Barbiturates: NOT DETECTED
Benzodiazepines: NOT DETECTED
Cocaine: POSITIVE — AB
Opiates: NOT DETECTED
Tetrahydrocannabinol: POSITIVE — AB

## 2023-02-07 LAB — COMPREHENSIVE METABOLIC PANEL
ALT: 28 U/L (ref 0–44)
AST: 20 U/L (ref 15–41)
Albumin: 3.5 g/dL (ref 3.5–5.0)
Alkaline Phosphatase: 86 U/L (ref 38–126)
Anion gap: 10 (ref 5–15)
BUN: 30 mg/dL — ABNORMAL HIGH (ref 6–20)
CO2: 21 mmol/L — ABNORMAL LOW (ref 22–32)
Calcium: 9.3 mg/dL (ref 8.9–10.3)
Chloride: 105 mmol/L (ref 98–111)
Creatinine, Ser: 0.8 mg/dL (ref 0.61–1.24)
GFR, Estimated: >60 mL/min (ref >60)
Glucose, Bld: 98 mg/dL (ref 70–99)
Potassium: 4 mmol/L (ref 3.5–5.1)
Sodium: 136 mmol/L (ref 135–145)
Total Bilirubin: 0.2 mg/dL (ref <1.2)
Total Protein: 6.6 g/dL (ref 6.5–8.1)

## 2023-02-07 LAB — D-DIMER, QUANTITATIVE: D-Dimer, Quant: 0.6 ug{FEU}/mL — ABNORMAL HIGH (ref 0.00–0.50)

## 2023-02-07 NOTE — ED Provider Notes (Addendum)
[place] EMERGENCY DEPARTMENT AT [facility] Provider Note   CSN: [id number] Arrival date & time: 02/07/23  0600     History  Chief Complaint  Patient presents with   Loss of Consciousness    [name] is a 57 y.o. male.  Patient reports that he continues to have episodes of syncope.  Patient was recently seen in the emergency department for the same.  He reports that he feels very anxious.  Episodes start with him feeling short of breath.  He then becomes dizzy and passes out.  He reports that it happened this time while he was lying down, not with position changes.       Home Medications Prior to Admission medications   Medication Sig Start Date End Date Taking? Authorizing Provider  albuterol (PROVENTIL) (2.5 MG/3ML) 0.083% nebulizer solution Take 3 mLs (2.5 mg total) by nebulization every 4 (four) hours as needed for wheezing or shortness of breath. 01/06/23   [name], MD  albuterol (VENTOLIN HFA) 108 (90 Base) MCG/ACT inhaler Inhale 2 puffs into the lungs every 4 (four) hours as needed for wheezing or shortness of breath. 01/06/23   [name], MD  amLODipine (NORVASC) 10 MG tablet Take 1 tablet (10 mg total) by mouth daily for 14 days. 12/21/22 01/04/23  [name], [name]  atorvastatin (LIPITOR) 20 MG tablet Take 1 tablet (20 mg total) by mouth daily for 14 days. 12/21/22 01/04/23  [name], [name]  docusate sodium (COLACE) 100 MG capsule Take 1 capsule (100 mg total) by mouth daily. Patient not taking: Reported on 11/22/2022 11/23/22   [name], MD  doxycycline (VIBRAMYCIN) 100 MG capsule Take 1 capsule (100 mg total) by mouth 2 (two) times daily. 01/06/23   [name], MD  FLUoxetine (PROZAC) 40 MG capsule Take 1 capsule (40 mg total) by mouth daily for 14 days. 12/21/22 01/04/23  [name], [name]  HYDROcodone bit-homatropine (HYCODAN) 5-1.5 MG/5ML syrup Take 5 mLs by mouth every 6 (six) hours as  needed for cough. 01/06/23   [name], MD  hydrOXYzine (VISTARIL) 25 MG capsule Take 1 capsule (25 mg total) by mouth every 8 (eight) hours as needed. 12/21/22   [name], [name]  ipratropium-albuterol (DUONEB) 0.5-2.5 (3) MG/3ML SOLN Take 3 mLs by nebulization every 4 (four) hours as needed (wheezing, shortness of breath). 12/21/22   [name], [name]  linaclotide (LINZESS) 290 MCG CAPS capsule Take 1 capsule (290 mcg total) by mouth daily before breakfast for 14 days. Patient not taking: Reported on 11/22/2022 11/23/22 12/07/22  [name], MD  melatonin 5 MG TABS Take 1 tablet (5 mg total) by mouth at bedtime. Patient not taking: Reported on 11/22/2022 11/22/22   [name], MD  naproxen (NAPROSYN) 500 MG tablet Take 1 tablet (500 mg total) by mouth 2 (two) times daily with a meal. 12/21/22   [name], [name]  pantoprazole (PROTONIX) 40 MG tablet Take 1 tablet (40 mg total) by mouth daily. 12/21/22   [name], [name]  polyethylene glycol (MIRALAX / GLYCOLAX) 17 g packet Take 17 g by mouth 2 (two) times daily. Patient not taking: Reported on 11/22/2022 11/22/22   [name], MD  predniSONE (DELTASONE) 20 MG tablet Take 2 tablets (40 mg total) by mouth daily with breakfast. 01/06/23   Deontray Hunnicutt, [name], MD  risperiDONE (RISPERDAL) 1 MG tablet Take 1 tablet (1 mg total) by mouth at bedtime for 14 days. Patient not taking: Reported on 11/22/2022 11/22/22  12/06/22  Massengill, Harrold Donath, MD  VENTOLIN HFA 108 (90 Base) MCG/ACT inhaler Inhale 2 puffs into the lungs every 6 (six) hours as needed for wheezing or shortness of breath. 12/21/22   [name], [name]      Allergies    Patient has no known allergies.    Review of Systems   Review of Systems  Physical Exam Updated Vital Signs BP (!) 136/90   Pulse 76   Temp 98 F (36.7 C) (Oral)   Resp 18   Ht 5\' 10"  (1.778 m)   Wt 86.2 kg   SpO2 100%   BMI 27.26 kg/m  Physical  Exam Vitals and nursing note reviewed.  Constitutional:      General: He is not in acute distress.    Appearance: He is well-developed.  HENT:     Head: Normocephalic and atraumatic.     Mouth/Throat:     Mouth: Mucous membranes are moist.  Eyes:     General: Vision grossly intact. Gaze aligned appropriately.     Extraocular Movements: Extraocular movements intact.     Conjunctiva/sclera: Conjunctivae normal.  Cardiovascular:     Rate and Rhythm: Normal rate and regular rhythm.     Pulses: Normal pulses.     Heart sounds: Normal heart sounds, S1 normal and S2 normal. No murmur heard.    No friction rub. No gallop.  Pulmonary:     Effort: Pulmonary effort is normal. No respiratory distress.     Breath sounds: Normal breath sounds.  Abdominal:     Palpations: Abdomen is soft.     Tenderness: There is no abdominal tenderness. There is no guarding or rebound.     Hernia: No hernia is present.  Musculoskeletal:        General: No swelling.     Cervical back: Full passive range of motion without pain, normal range of motion and neck supple. No pain with movement, spinous process tenderness or muscular tenderness. Normal range of motion.     Right lower leg: No edema.     Left lower leg: No edema.  Skin:    General: Skin is warm and dry.     Capillary Refill: Capillary refill takes less than 2 seconds.     Findings: No ecchymosis, erythema, lesion or wound.  Neurological:     Mental Status: He is alert and oriented to person, place, and time.     GCS: GCS eye subscore is 4. GCS verbal subscore is 5. GCS motor subscore is 6.     Cranial Nerves: Cranial nerves 2-12 are intact.     Sensory: Sensation is intact.     Motor: Motor function is intact. No weakness or abnormal muscle tone.     Coordination: Coordination is intact.  Psychiatric:        Mood and Affect: Mood is anxious.        Speech: Speech normal.        Behavior: Behavior normal.     ED Results / Procedures /  Treatments   Labs (all labs ordered are listed, but only abnormal results are displayed) Labs Reviewed  CBC WITH DIFFERENTIAL/PLATELET  COMPREHENSIVE METABOLIC PANEL  RAPID URINE DRUG SCREEN, HOSP PERFORMED  D-DIMER, QUANTITATIVE    EKG EKG Interpretation Date/Time:  Monday February 07 2023 06:54:40 EST Ventricular Rate:  80 PR Interval:  155 QRS Duration:  91 QT Interval:  374 QTC Calculation: 432 R Axis:   25  Text Interpretation: Sinus rhythm Baseline wander in  lead(s) V4 Otherwise within normal limits No significant change since last tracing Confirmed by [name] [phone]) on 02/07/2023 6:56:39 AM  Radiology No results found.  Procedures Procedures    Medications Ordered in ED Medications - No data to display  ED Course/ Medical Decision Making/ A&P                                 Medical Decision Making Amount and/or Complexity of Data Reviewed Labs: ordered.   Differential Diagnosis considered includes, but not limited to: Arrhythmia; MI; vasovagal episode; seizure; toxidrome; PE; stroke  Presents to the emergency department for recurrent syncope.  Patient seen in the ED 3 days ago with same.  At that time he seemed dehydrated and orthostatic, was given fluids and told to increase his fluid intake.  CT head, cardiac evaluation were negative at that time.  Patient clearly has an anxiety component.  He is asking for something to help him with his anxiety.  Will repeat some workup.  Patient had a negative D-dimer the other day.  With his continued complaints of shortness of breath prior to syncope, will repeat.  If elevated, likely pursue CT angiography.  No chest pain, does not require further troponins.  Check drug screen.  If all workup is negative, likely can treat for anxiety.  Will sign with oncoming ER physician to follow results.        Final Clinical Impression(s) / ED Diagnoses Final diagnoses:  Syncope, unspecified syncope type     Rx / DC Orders ED Discharge Orders     None         Luz Burcher, [name], MD 02/07/23 6045    [name], MD 02/07/23 [phone]

## 2023-02-07 NOTE — Discharge Instructions (Addendum)
Your testing has been unremarkable and reassuring, I would strongly recommend that you stop using cocaine immediately.  All of your other test have come back looking reassuring and unremarkable, your vital signs including her heart rate and your blood pressure have been unremarkable.  I would strongly recommend that you follow-up with a heart doctor because of the passing out spell, I have given you their phone number above, call first thing this morning to make an appointment

## 2023-02-07 NOTE — ED Triage Notes (Signed)
Pt to ED cc episodes of LOC. Seen 3 days ago for same. Endorses shortness of breath. States he feels dizzy then passes out. A/o x4

## 2023-02-07 NOTE — ED Provider Notes (Signed)
57 year old patient with recurrent episode of syncope, no arrhythmias noted at all, labs have been unremarkable, D-dimer is still borderline and negative, drug screen positive for cocaine, no recurrent syncopal episodes while here.  No edema of the legs, no tachycardia, no hypoxia, well-appearing, counseled on drug use, stable for discharge and referred to cardiology as an outpatient   Eber Hong, MD 02/07/23 [phone]

## 2023-02-11 ENCOUNTER — Other Ambulatory Visit: Payer: Self-pay | Admitting: Obstetrics and Gynecology

## 2023-02-11 ENCOUNTER — Ambulatory Visit: Payer: Medicaid Other

## 2023-02-11 NOTE — Patient Outreach (Signed)
 Medicaid Managed Care   Nurse Care Manager Note  02/11/2023 Name:  [name] MRN:  [id number] DOB:  29-Oct-1965  [name] is an 58 y.o. year old male who is a primary patient of [name], [name], [name].  [facility] Managed Care Coordination team was consulted for assistance with:    Chronic healthcare management needs, GERD, asthma, COPD, h/o CVA, arthritis, chronic hepatitis, anxiety/depression, emphysema, neuropathy, HLD  Mr. Silvers was given information about Medicaid Managed Care Coordination team services today. [name] Patient agreed to services and verbal consent obtained.  Engaged with patient by telephone for follow up visit in response to provider referral for case management and/or care coordination services.   Patient is participating in a Managed Medicaid Plan:  Yes  Assessments/Interventions:  Review of past medical history, allergies, medications, health status, including review of consultants reports, laboratory and other test data, was performed as part of comprehensive evaluation and provision of chronic care management services.  SDOH (Social Drivers of Health) assessments and interventions performed: SDOH Interventions    Flowsheet Row Patient Outreach Telephone from 02/11/2023 in [place] POPULATION HEALTH DEPARTMENT Patient Outreach Telephone from 01/11/2023 in [place] POPULATION HEALTH DEPARTMENT Office Visit from 12/21/2022 in [facility] [place] [facility] Patient Outreach Telephone from 12/10/2022 in [place] POPULATION HEALTH DEPARTMENT Patient Outreach Telephone from 09/16/2022 in [place] POPULATION HEALTH DEPARTMENT Patient Outreach Telephone from 08/16/2022 in [place] POPULATION HEALTH DEPARTMENT  SDOH Interventions        Food Insecurity Interventions Intervention Not Indicated -- -- -- -- Intervention Not Indicated  Depression Interventions/Treatment  -- -- Medication, Counseling -- -- --  Financial Strain Interventions -- -- --  Intervention Not Indicated -- --  Physical Activity Interventions -- Intervention Not Indicated -- -- -- --  Stress Interventions -- -- -- Intervention Not Indicated -- --  Social Connections Interventions -- Intervention Not Indicated -- -- -- --  Health Literacy Interventions Intervention Not Indicated -- -- -- Intervention Not Indicated --     Care Plan No Known Allergies  Medications Reviewed Today   Medications were not reviewed in this encounter    Patient Active Problem List   Diagnosis Date Noted   Rib pain on left side 12/21/2022   Anxiety 12/21/2022   Severe episode of recurrent major depressive disorder, with psychotic features (HCC) 11/23/2022   Constipation 11/18/2022   Suicidal ideation 11/11/2022   Severe episode of recurrent major depressive disorder, without psychotic features (HCC) 11/11/2022   Alcohol  abuse 11/11/2022   Moderate cocaine use disorder (HCC) 11/11/2022   MDD (major depressive disorder), recurrent severe, without psychosis (HCC) 11/11/2022   Alcohol  dependence (HCC) 11/11/2022   History of colonic polyps 09/13/2022   Unilateral inguinal hernia without obstruction or gangrene 09/13/2022   Erectile dysfunction 03/17/2022   Inguinal bulge 03/17/2022   Depression due to old stroke 02/13/2022   Encounter for general adult medical examination with abnormal findings 02/13/2022   Elevated LDL cholesterol level 02/13/2022   Primary hypertension 02/13/2022   Intermittent explosive disorder 11/22/2021   Protein-calorie malnutrition, severe 10/31/2021   Anoxic brain injury (HCC) 10/31/2021   Acute respiratory failure with hypoxia (HCC)    Cerebrovascular accident (CVA) (HCC)    Acute encephalopathy 10/22/2021   Fever    Pleural effusion    Deep venous thrombosis (HCC)    Pain and swelling of left lower leg 12/28/2018   Septic arthritis of shoulder, right (HCC) 09/07/2018   Septic arthritis of sacroiliac joint (HCC) 09/07/2018  Sternoclavicular joint  pain, left 09/07/2018   Discitis of lumbosacral region 06/28/2018   Psoas abscess, right (HCC) 06/28/2018   Polysubstance abuse (HCC) 06/24/2018   Normocytic anemia 06/24/2018   Recurrent boils 06/24/2018   Head injury    Septic arthritis of knee, left (HCC)    Acute medial meniscal tear, left, initial encounter    GERD (gastroesophageal reflux disease) 06/22/2018   Mild protein-calorie malnutrition (HCC) 06/22/2018   AKI (acute kidney injury) (HCC) 06/21/2018   Spinal stenosis of lumbosacral region 06/21/2018   Chronic hepatitis C (HCC) 12/17/2013   Cigarette smoker 12/17/2013   Family history of diabetes mellitus (DM) 12/17/2013   Notalgia 12/17/2013   Conditions to be addressed/monitored per PCP order:  Chronic healthcare management needs, GERD, asthma, COPD, h/o CVA, arthritis, chronic hepatitis, anxiety/depression, emphysema, neuropathy, HLD  Care Plan : RN Care Manager Plan of Care  Updates made by Milon Veva MATSU, RN since 02/11/2023 12:00 AM     Problem: Health Promotion or Disease Self-Management (General Plan of Care)      Long-Range Goal: Chronic Disease Management   Start Date: 12/16/2021  Expected End Date: 05/12/2023  Priority: High  Note:   Current Barriers:  Knowledge Deficits related to plan of care for management of recent ischemic stroke, arthritis, anxiety/depression, tobacco use, GERD, HTN, h/o DVT Chronic Disease Management support and education needs related to recent ischemic stroke, arthritis, anxiety/depression, tobacco use, GERD, HTN, h/o DVT 02/11/23:  patient seen in ED 12/27 and 12/30 for syncope-awaiting CARDS referral.  Does not check BP-states taking all meds.6 ED visits and 1 hospitalization in last 6 mos.  No f/u appts scheduled  with PULM, SURG, or PCP.  States needs additional housing information-has [name] appt next week.  Breathing ok with breathing treatments and meds.  RNCM Clinical Goal(s):  Patient will verbalize understanding of plan for  management of ischemic stroke  as evidenced by patient report and follow up  through collaboration with RN Care manager, provider, and care team.   Interventions: Inter-disciplinary care team collaboration (see longitudinal plan of care) Evaluation of current treatment plan related to  self management and patient's adherence to plan as established by provider Patient provided with eye provider information. Collaborated with [name] [name] referral for dental resources, housing resources 01/11/23:  Congratulations given on smoking cessation!  Hypertension Interventions:  (Status:  New goal.) Long Term Goal Last practice recorded BP readings:  BP Readings from Last 3 Encounters:        01/06/23 109/69  05/12/22:            137/100 08/18/22:          134/86  Most recent eGFR/CrCl: No results found for: EGFR  No components found for: CRCL  Evaluation of current treatment plan related to hypertension self management and patient's adherence to plan as established by provider Reviewed medications with patient and discussed importance of compliance Discussed plans with patient for ongoing care management follow up and provided patient with direct contact information for care management team Reviewed scheduled/upcoming provider appointments including:  Assessed social determinant of health barriers   Stroke:  (Status:New goal.) Long Term Goal Reviewed Importance of taking all medications as prescribed Reviewed Importance of attending all scheduled provider appointments Advised to report any changes in symptoms or exercise tolerance Assessed social determinant of health barriers Assessed for signs and symptoms of stroke Assessed for cognitive impairment Assessed use of tobacco use  Patient Goals/Self-Care Activities: Take all medications as prescribed Attend all  scheduled provider appointments Call pharmacy for medication refills 3-7 days in advance of running out of medications Perform all  self care activities independently  Perform IADL's (shopping, preparing meals, housekeeping, managing finances) independently Call provider office for new concerns or questions  01/11/23:  To f/u regarding PULM and Surgeon appt  Follow Up Plan:  The patient has been provided with contact information for the care management team and has been advised to call with any health related questions or concerns.  The care management team will reach out to the patient again over the next 30 business  days.    Long-Range Goal: Establish Plan of Care for Chronic Disease Managedment Needs   Priority: High  Note:   Timeframe:  Long-Range Goal Priority:  High Start Date:     12/16/21                        Expected End Date:  ongoing                     Follow Up Date: 03/15/23   - schedule appointment for flu shot - schedule appointment for vaccines needed due to my age or health - schedule recommended health tests  - schedule and keep appointment for annual check-up    Why is this important?   Screening tests can find diseases early when they are easier to treat.  Your doctor or nurse will talk with you about which tests are important for you.  Getting shots for common diseases like the flu and shingles will help prevent them.   02/11/23:  No upcoming appts- awaiting CARDS referral   Follow Up:  Patient agrees to Care Plan and Follow-up.  Plan: The Managed Medicaid care management team will reach out to the patient again over the next 30 business  days. and The  Patient has been provided with contact information for the Managed Medicaid care management team and has been advised to call with any health related questions or concerns.  Date/time of next scheduled RN care management/care coordination outreach: 03/15/23 at 1030.

## 2023-02-11 NOTE — Patient Instructions (Signed)
 Hi [name], thank you for the updates, have a good afternoon.  [name] was given information about Medicaid Managed Care team care coordination services as a part of their [facility] Medicaid benefit. [name] verbally consented to engagement with the [facility] Managed Care team.   If you are experiencing a medical emergency, please call 911 or report to your local emergency department or urgent care.   If you have a non-emergency medical problem during routine business hours, please contact your provider's office and ask to speak with a nurse.   For questions related to your [facility] health plan, please call: [phone] or go here:[email or web address]  If you would like to schedule transportation through your [facility] plan, please call the following number at least 2 days in advance of your appointment: [phone].   You can also use the MTM portal or MTM mobile app to manage your rides. Reimbursement for transportation is available through [facility]! For the portal, please go to [email or web address]  Call the [facility] Crisis Line at [phone], at any time, 24 hours a day, 7 days a week. If you are in danger or need immediate medical attention call 911.  If you would like help to quit smoking, call 1-800-QUIT-NOW (4145448305) OR Espaol: 1-855-Djelo-Ya (8-144-664-6430) o para ms informacin haga clic aqu or Text READY to 799-599 to register via text  Mr. Jou - following are the goals we discussed in your visit today:   Goals Addressed    Timeframe:  Long-Range Goal Priority:  High Start Date:     12/16/21                        Expected End Date:  ongoing                     Follow Up Date: 03/15/23   - schedule appointment for flu shot - schedule appointment for vaccines needed due to my age or health - schedule recommended health tests  - schedule and keep appointment for annual check-up    Why is this important?   Screening  tests can find diseases early when they are easier to treat.  Your doctor or nurse will talk with you about which tests are important for you.  Getting shots for common diseases like the flu and shingles will help prevent them.   02/11/23:  No upcoming appts- awaiting CARDS referral  Patient verbalizes understanding of instructions and care plan provided today and agrees to view in MyChart. Active MyChart status and patient understanding of how to access instructions and care plan via MyChart confirmed with patient.     The Managed Medicaid care management team will reach out to the patient again over the next 30 business  days.  The  Patient has been provided with contact information for the Managed Medicaid care management team and has been advised to call with any health related questions or concerns.   Veva Angles RN, BSN [place]  [facility] Care Management Coordinator - Managed Medicaid High Risk [phone]   Following is a copy of your plan of care:  Care Plan : RN Care Manager Plan of Care  Updates made by Angles Veva MATSU, RN since 02/11/2023 12:00 AM     Problem: Health Promotion or Disease Self-Management (General Plan of Care)      Long-Range Goal: Chronic Disease Management   Start Date: 12/16/2021  Expected End Date: 05/12/2023  Priority: High  Note:  Current Barriers:  Knowledge Deficits related to plan of care for management of recent ischemic stroke, arthritis, anxiety/depression, tobacco use, GERD, HTN, h/o DVT Chronic Disease Management support and education needs related to recent ischemic stroke, arthritis, anxiety/depression, tobacco use, GERD, HTN, h/o DVT 02/11/23:  patient seen in ED 12/27 and 12/30 for syncope-awaiting CARDS referral.  Does not check BP-states taking all meds.6 ED visits and 1 hospitalization in last 6 mos.  No f/u appts scheduled  with PULM, SURG, or PCP.  States needs additional housing information-has [name] appt next week.   Breathing ok with breathing treatments and meds.  RNCM Clinical Goal(s):  Patient will verbalize understanding of plan for management of ischemic stroke  as evidenced by patient report and follow up  through collaboration with RN Care manager, provider, and care team.   Interventions: Inter-disciplinary care team collaboration (see longitudinal plan of care) Evaluation of current treatment plan related to  self management and patient's adherence to plan as established by provider Patient provided with eye provider information. Collaborated with [name] [name] referral for dental resources, housing resources 01/11/23:  Congratulations given on smoking cessation!  Hypertension Interventions:  (Status:  New goal.) Long Term Goal Last practice recorded BP readings:  BP Readings from Last 3 Encounters:        01/06/23 109/69  05/12/22:            137/100 08/18/22:          134/86  Most recent eGFR/CrCl: No results found for: EGFR  No components found for: CRCL  Evaluation of current treatment plan related to hypertension self management and patient's adherence to plan as established by provider Reviewed medications with patient and discussed importance of compliance Discussed plans with patient for ongoing care management follow up and provided patient with direct contact information for care management team Reviewed scheduled/upcoming provider appointments including:  Assessed social determinant of health barriers   Stroke:  (Status:New goal.) Long Term Goal Reviewed Importance of taking all medications as prescribed Reviewed Importance of attending all scheduled provider appointments Advised to report any changes in symptoms or exercise tolerance Assessed social determinant of health barriers Assessed for signs and symptoms of stroke Assessed for cognitive impairment Assessed use of tobacco use  Patient Goals/Self-Care Activities: Take all medications as prescribed Attend all scheduled  provider appointments Call pharmacy for medication refills 3-7 days in advance of running out of medications Perform all self care activities independently  Perform IADL's (shopping, preparing meals, housekeeping, managing finances) independently Call provider office for new concerns or questions  01/11/23:  To f/u regarding PULM and Surgeon appt  Follow Up Plan:  The patient has been provided with contact information for the care management team and has been advised to call with any health related questions or concerns.  The care management team will reach out to the patient again over the next 30 business  days.

## 2023-02-17 ENCOUNTER — Other Ambulatory Visit: Payer: Self-pay

## 2023-02-17 NOTE — Patient Outreach (Signed)
 Medicaid Managed Care Social Work Note  02/17/2023 Name:  [name] MRN:  [id number] DOB:  1965-11-11  [name] is an 58 y.o. year old male who is a primary patient of [name], [name], [name].  The [facility] Managed Care Coordination team was consulted for assistance with:   housing  [name] was given information about Medicaid Managed Care Coordination team services today. [name] Patient agreed to services and verbal consent obtained.  Engaged with patient  for by telephone forfollow up visit in response to referral for case management and/or care coordination services.   Patient is participating in a Managed Medicaid Plan:  Yes  Assessments/Interventions:  Review of past medical history, allergies, medications, health status, including review of consultants reports, laboratory and other test data, was performed as part of comprehensive evaluation and provision of chronic care management services.  SDOH: (Social Drivers of Health) assessments and interventions performed: SDOH Interventions    Flowsheet Row Patient Outreach Telephone from 02/11/2023 in [place] [facility] POPULATION HEALTH DEPARTMENT Patient Outreach Telephone from 01/11/2023 in [place] POPULATION HEALTH DEPARTMENT Office Visit from 12/21/2022 in [facility] [place] [facility] Patient Outreach Telephone from 12/10/2022 in [place] POPULATION HEALTH DEPARTMENT Patient Outreach Telephone from 09/16/2022 in [place] POPULATION HEALTH DEPARTMENT Patient Outreach Telephone from 08/16/2022 in [place] POPULATION HEALTH DEPARTMENT  SDOH Interventions        Food Insecurity Interventions Intervention Not Indicated -- -- -- -- Intervention Not Indicated  Depression Interventions/Treatment  -- -- Medication, Counseling -- -- --  Financial Strain Interventions -- -- -- Intervention Not Indicated -- --  Physical Activity Interventions -- Intervention Not Indicated -- -- -- --  Stress Interventions -- -- -- Intervention  Not Indicated -- --  Social Connections Interventions -- Intervention Not Indicated -- -- -- --  Health Literacy Interventions Intervention Not Indicated -- -- -- Intervention Not Indicated --     [name] completed a telephone outreach with patient, he states that he did receive the resources [employer] and those are not resources that he wants. Patient states he would like his own place. [name] has provided all avaliable resources to patient.  Advanced Directives Status:  Not addressed in this encounter.  Care Plan                 No Known Allergies  Medications Reviewed Today   Medications were not reviewed in this encounter     Patient Active Problem List   Diagnosis Date Noted   Rib pain on left side 12/21/2022   Anxiety 12/21/2022   Severe episode of recurrent major depressive disorder, with psychotic features (HCC) 11/23/2022   Constipation 11/18/2022   Suicidal ideation 11/11/2022   Severe episode of recurrent major depressive disorder, without psychotic features (HCC) 11/11/2022   Alcohol  abuse 11/11/2022   Moderate cocaine use disorder (HCC) 11/11/2022   MDD (major depressive disorder), recurrent severe, without psychosis (HCC) 11/11/2022   Alcohol  dependence (HCC) 11/11/2022   History of colonic polyps 09/13/2022   Unilateral inguinal hernia without obstruction or gangrene 09/13/2022   Erectile dysfunction 03/17/2022   Inguinal bulge 03/17/2022   Depression due to old stroke 02/13/2022   Encounter for general adult medical examination with abnormal findings 02/13/2022   Elevated LDL cholesterol level 02/13/2022   Primary hypertension 02/13/2022   Intermittent explosive disorder 11/22/2021   Protein-calorie malnutrition, severe 10/31/2021   Anoxic brain injury (HCC) 10/31/2021   Acute respiratory failure with hypoxia (HCC)    Cerebrovascular accident (CVA) (HCC)  Acute encephalopathy 10/22/2021   Fever    Pleural effusion    Deep venous thrombosis (HCC)    Pain and  swelling of left lower leg 12/28/2018   Septic arthritis of shoulder, right (HCC) 09/07/2018   Septic arthritis of sacroiliac joint (HCC) 09/07/2018   Sternoclavicular joint pain, left 09/07/2018   Discitis of lumbosacral region 06/28/2018   Psoas abscess, right (HCC) 06/28/2018   Polysubstance abuse (HCC) 06/24/2018   Normocytic anemia 06/24/2018   Recurrent boils 06/24/2018   Head injury    Septic arthritis of knee, left (HCC)    Acute medial meniscal tear, left, initial encounter    GERD (gastroesophageal reflux disease) 06/22/2018   Mild protein-calorie malnutrition (HCC) 06/22/2018   AKI (acute kidney injury) (HCC) 06/21/2018   Spinal stenosis of lumbosacral region 06/21/2018   Chronic hepatitis C (HCC) 12/17/2013   Cigarette smoker 12/17/2013   Family history of diabetes mellitus (DM) 12/17/2013   Notalgia 12/17/2013    Conditions to be addressed/monitored per PCP order:   housing  There are no care plans that you recently modified to display for this patient.   Follow up:  Patient agrees to Care Plan and Follow-up.  Plan: The  Patient has been provided with contact information for the Managed Medicaid care management team and has been advised to call with any health related questions or concerns.    Thersia Delene ROBINS, MHA [facility]  Managed [address] [facility] Social Worker [phone]

## 2023-02-17 NOTE — Patient Instructions (Signed)
Visit Information  Mr. Damuth was given information about Medicaid Managed Care team care coordination services as a part of their [facility] Medicaid benefit. Lancer Kindley verbally consented to engagement with the [facility] Managed Care team.   If you are experiencing a medical emergency, please call 911 or report to your local emergency department or urgent care.   If you have a non-emergency medical problem during routine business hours, please contact your provider's office and ask to speak with a nurse.   For questions related to your [facility] health plan, please call: [phone] or go here:[email or web address]  If you would like to schedule transportation through your [facility] plan, please call the following number at least 2 days in advance of your appointment: [phone].   You can also use the MTM portal or MTM mobile app to manage your rides. Reimbursement for transportation is available through [employer]! For the portal, please go to [email or web address]  Call the [facility] Crisis Line at [phone], at any time, 24 hours a day, 7 days a week. If you are in danger or need immediate medical attention call 911.  If you would like help to quit smoking, call 1-800-QUIT-NOW (478 454 5167) OR Espaol: 1-855-Djelo-Ya (4-132-440-1027) o para ms informacin haga clic aqu or Text READY to 253-664 to register via text  Mr. Inga - following are the goals we discussed in your visit today:   Goals Addressed   None      The  Patient                                              has been provided with contact information for the Managed Medicaid care management team and has been advised to call with any health related questions or concerns.   [name], [name], MHA [facility]  Managed Medicaid Social Worker [phone]   Following is a copy of your plan of care:  There are no care plans that you recently modified to display for this  patient.

## 2023-03-02 ENCOUNTER — Telehealth: Payer: Self-pay

## 2023-03-02 NOTE — Telephone Encounter (Signed)
Patient has missed four appointments and may be considered for dismissal per No Show policy. Per provider, care can remain established.

## 2023-03-15 ENCOUNTER — Other Ambulatory Visit: Payer: Self-pay | Admitting: Obstetrics and Gynecology

## 2023-03-15 NOTE — Patient Instructions (Signed)
 Hi [name], I am sorry I missed you today, I hope you are doing okay- as a part of your Medicaid benefit, you are eligible for care management and care coordination services at no cost or copay. I was unable to reach you by phone today but would be happy to help you with your health related needs. Please feel free to call me at [phone].  A member of the Managed Medicaid care management team will reach out to you again over the next 30 business  days.   Veva Angles RN, BSN, [employer] [facility] RN Care Manager Direct Dial [phone]/Qjk [phone] Website: [email or web address]

## 2023-03-15 NOTE — Patient Outreach (Signed)
  Medicaid Managed Care   Unsuccessful Attempt Note   03/15/2023 Name: [name] MRN: [id number] DOB: 1965/07/08  Referred by: [name], [name], [name] Reason for referral : High Risk Managed Medicaid (Unsuccessful telephone outreach)  An unsuccessful telephone outreach was attempted today. The patient was referred to the case management team for assistance with care management and care coordination.    Follow Up Plan: The Managed Medicaid care management team will reach out to the patient again over the next 30 business days. and The  Patient has been provided with contact information for the Managed Medicaid care management team and has been advised to call with any health related questions or concerns.   Veva Angles RN, BSN, [employer] [facility] RN Care Manager Direct Dial [phone]/Qjk [phone] Website: [email or web address]

## 2023-03-21 ENCOUNTER — Other Ambulatory Visit: Payer: Self-pay | Admitting: Obstetrics and Gynecology

## 2023-03-21 NOTE — Patient Outreach (Signed)
  Medicaid Managed Care   Unsuccessful Attempt Note   03/21/2023 Name: [name] MRN: [id number] DOB: [birth year]  Referred by: [name], [name], [name] Reason for referral : High Risk Managed Medicaid (Unsuccessful telephone outreach)  A second unsuccessful telephone outreach was attempted today. The patient was referred to the case management team for assistance with care management and care coordination.    Follow Up Plan: The Managed Medicaid care management team will reach out to the patient again over the next 30 business days. and The  Patient has been provided with contact information for the Managed Medicaid care management team and has been advised to call with any health related questions or concerns.   Glenn Lange RN, BSN, [facility] RN Care Manager Direct Dial [phone]/GEX [phone] Website: [email or web address]

## 2023-03-21 NOTE — Patient Instructions (Signed)
 Hi Mr. Asche, I hope you are okay, sorry I have missed you again today- as a part of your Medicaid benefit, you are eligible for care management and care coordination services at no cost or copay. I was unable to reach you by phone today but would be happy to help you with your health related needs. Please feel free to call me at [phone]..   A member of the Managed Medicaid care management team will reach out to you again over the next 30 business  days.   Glenn Lange RN, BSN, [facility] RN Care Manager Direct Dial [phone]/PPI [phone] website: [email or web address]

## 2023-03-22 ENCOUNTER — Encounter (HOSPITAL_COMMUNITY): Payer: Self-pay

## 2023-03-22 ENCOUNTER — Emergency Department (HOSPITAL_COMMUNITY)
Admission: EM | Admit: 2023-03-22 | Discharge: 2023-03-23 | Disposition: A | Discharge status: Other Acute Inpatient Hospital | Payer: MEDICAID | Attending: Emergency Medicine | Admitting: Emergency Medicine

## 2023-03-22 ENCOUNTER — Other Ambulatory Visit: Payer: Self-pay

## 2023-03-22 DIAGNOSIS — F1410 Cocaine abuse, uncomplicated: Secondary | ICD-10-CM | POA: Diagnosis not present

## 2023-03-22 DIAGNOSIS — F411 Generalized anxiety disorder: Secondary | ICD-10-CM | POA: Insufficient documentation

## 2023-03-22 DIAGNOSIS — F1010 Alcohol abuse, uncomplicated: Secondary | ICD-10-CM | POA: Insufficient documentation

## 2023-03-22 DIAGNOSIS — S61512A Laceration without foreign body of left wrist, initial encounter: Secondary | ICD-10-CM | POA: Diagnosis not present

## 2023-03-22 DIAGNOSIS — R45851 Suicidal ideations: Secondary | ICD-10-CM

## 2023-03-22 DIAGNOSIS — X781XXA Intentional self-harm by knife, initial encounter: Secondary | ICD-10-CM | POA: Insufficient documentation

## 2023-03-22 DIAGNOSIS — F32A Depression, unspecified: Secondary | ICD-10-CM | POA: Diagnosis not present

## 2023-03-22 DIAGNOSIS — F331 Major depressive disorder, recurrent, moderate: Secondary | ICD-10-CM | POA: Insufficient documentation

## 2023-03-22 LAB — COMPREHENSIVE METABOLIC PANEL
ALT: 28 U/L (ref 0–44)
AST: 18 U/L (ref 15–41)
Albumin: 3.7 g/dL (ref 3.5–5.0)
Alkaline Phosphatase: 80 U/L (ref 38–126)
Anion gap: 8 (ref 5–15)
BUN: 20 mg/dL (ref 6–20)
CO2: 26 mmol/L (ref 22–32)
Calcium: 9.2 mg/dL (ref 8.9–10.3)
Chloride: 104 mmol/L (ref 98–111)
Creatinine, Ser: 0.89 mg/dL (ref 0.61–1.24)
GFR, Estimated: >60 mL/min (ref >60)
Glucose, Bld: 102 mg/dL — ABNORMAL HIGH (ref 70–99)
Potassium: 3.4 mmol/L — ABNORMAL LOW (ref 3.5–5.1)
Sodium: 138 mmol/L (ref 135–145)
Total Bilirubin: 0.7 mg/dL (ref 0.0–1.2)
Total Protein: 6.9 g/dL (ref 6.5–8.1)

## 2023-03-22 LAB — CBC WITH DIFFERENTIAL/PLATELET
Abs Immature Granulocytes: 0.08 10*3/uL — ABNORMAL HIGH (ref 0.00–0.07)
Basophils Absolute: 0.1 10*3/uL (ref 0.0–0.1)
Basophils Relative: 1 %
Eosinophils Absolute: 0.2 10*3/uL (ref 0.0–0.5)
Eosinophils Relative: 3 %
HCT: 45.3 % (ref 39.0–52.0)
Hemoglobin: 14.9 g/dL (ref 13.0–17.0)
Immature Granulocytes: 1 %
Lymphocytes Relative: 21 %
Lymphs Abs: 1.6 10*3/uL (ref 0.7–4.0)
MCH: 31.1 pg (ref 26.0–34.0)
MCHC: 32.9 g/dL (ref 30.0–36.0)
MCV: 94.6 fL (ref 80.0–100.0)
Monocytes Absolute: 0.6 10*3/uL (ref 0.1–1.0)
Monocytes Relative: 7 %
Neutro Abs: 5.4 10*3/uL (ref 1.7–7.7)
Neutrophils Relative %: 67 %
Platelets: 259 10*3/uL (ref 150–400)
RBC: 4.79 MIL/uL (ref 4.22–5.81)
RDW: 13.9 % (ref 11.5–15.5)
WBC: 7.9 10*3/uL (ref 4.0–10.5)
nRBC: 0 % (ref 0.0–0.2)

## 2023-03-22 LAB — RAPID URINE DRUG SCREEN, HOSP PERFORMED
Amphetamines: NOT DETECTED
Barbiturates: NOT DETECTED
Benzodiazepines: POSITIVE — AB
Cocaine: POSITIVE — AB
Opiates: NOT DETECTED
Tetrahydrocannabinol: POSITIVE — AB

## 2023-03-22 LAB — ETHANOL: Alcohol, Ethyl (B): <10 mg/dL (ref <10)

## 2023-03-22 LAB — SALICYLATE LEVEL: Salicylate Lvl: <7 mg/dL — ABNORMAL LOW (ref 7.0–30.0)

## 2023-03-22 LAB — ACETAMINOPHEN LEVEL: Acetaminophen (Tylenol), Serum: <10 ug/mL — ABNORMAL LOW (ref 10–30)

## 2023-03-22 MED ORDER — FLUOXETINE HCL 20 MG PO CAPS
20.0000 mg | ORAL_CAPSULE | Freq: Every day | ORAL | Status: DC
  Administered 2023-03-22 – 2023-03-23 (×2): 20 mg via ORAL
  Filled 2023-03-22 (×2): qty 1

## 2023-03-22 MED ORDER — THIAMINE HCL 100 MG/ML IJ SOLN: 100.0000 mg | Freq: Every day | INTRAMUSCULAR | Status: DC

## 2023-03-22 MED ORDER — LORAZEPAM 1 MG PO TABS
1.0000 mg | ORAL_TABLET | ORAL | Status: DC | PRN
  Administered 2023-03-22: 2 mg via ORAL
  Administered 2023-03-23: 1 mg via ORAL
  Administered 2023-03-23: 2 mg via ORAL
  Filled 2023-03-22: qty 2
  Filled 2023-03-22: qty 1
  Filled 2023-03-22: qty 2

## 2023-03-22 MED ORDER — QUETIAPINE FUMARATE 25 MG PO TABS: 50.0000 mg | ORAL_TABLET | Freq: Every day | ORAL | Status: DC

## 2023-03-22 MED ORDER — FOLIC ACID 1 MG PO TABS
1.0000 mg | ORAL_TABLET | Freq: Every day | ORAL | Status: DC
  Administered 2023-03-22 – 2023-03-23 (×2): 1 mg via ORAL
  Filled 2023-03-22 (×2): qty 1

## 2023-03-22 MED ORDER — LORAZEPAM 1 MG PO TABS
1.0000 mg | ORAL_TABLET | Freq: Once | ORAL | Status: AC
  Administered 2023-03-22: 1 mg via ORAL
  Filled 2023-03-22: qty 1

## 2023-03-22 MED ORDER — ADULT MULTIVITAMIN W/MINERALS CH
1.0000 | ORAL_TABLET | Freq: Every day | ORAL | Status: DC
  Administered 2023-03-22 – 2023-03-23 (×2): 1 via ORAL
  Filled 2023-03-22 (×2): qty 1

## 2023-03-22 MED ORDER — THIAMINE MONONITRATE 100 MG PO TABS
100.0000 mg | ORAL_TABLET | Freq: Every day | ORAL | Status: DC
Start: 2023-03-22 — End: 2023-03-23
  Administered 2023-03-22 – 2023-03-23 (×2): 100 mg via ORAL
  Filled 2023-03-22 (×2): qty 1

## 2023-03-22 MED ORDER — LORAZEPAM 2 MG/ML IJ SOLN: 1.0000 mg | INTRAMUSCULAR | Status: DC | PRN

## 2023-03-22 NOTE — ED Provider Notes (Signed)
[place] EMERGENCY DEPARTMENT AT [facility] Provider Note   CSN: [id number] Arrival date & time: 03/22/23  1043     History  Chief Complaint  Patient presents with   Suicidal    [name] is a 58 y.o. male.  Is a 58 year old male who presents to Emergency Department with a chief complaint of increased depression and suicidal ideation.  Patient notes he does active plan to cut himself at this point.  He does have a superficial laceration over the left wrist which he notes occurred this morning when he cut himself with a kitchen knife.  He denies any other attempts to harm himself at home.  He notes that he is not currently on his chronic medications at this time as he has been out of them.  He denies any associated hallucinations or delusions.  He admits to alcohol and cocaine use but notes that it has been 4 days since the last time he used either one.  At this time the patient has no acute systemic complaints to include fever, chills, chest pain, shortness of breath, abdominal pain, nausea, vomiting, diarrhea.  Patient notes that he is just very anxious at this time.        Home Medications Prior to Admission medications   Medication Sig Start Date End Date Taking? Authorizing Provider  albuterol (PROVENTIL) (2.5 MG/3ML) 0.083% nebulizer solution Take 3 mLs (2.5 mg total) by nebulization every 4 (four) hours as needed for wheezing or shortness of breath. 01/06/23   [name], MD  albuterol (VENTOLIN HFA) 108 (90 Base) MCG/ACT inhaler Inhale 2 puffs into the lungs every 4 (four) hours as needed for wheezing or shortness of breath. 01/06/23   [name], MD  amLODipine (NORVASC) 10 MG tablet Take 1 tablet (10 mg total) by mouth daily for 14 days. 12/21/22 01/04/23  [name], [name]  atorvastatin (LIPITOR) 20 MG tablet Take 1 tablet (20 mg total) by mouth daily for 14 days. 12/21/22 01/04/23  [name], [name]  docusate sodium (COLACE) 100  MG capsule Take 1 capsule (100 mg total) by mouth daily. Patient not taking: Reported on 11/22/2022 11/23/22   [name], MD  doxycycline (VIBRAMYCIN) 100 MG capsule Take 1 capsule (100 mg total) by mouth 2 (two) times daily. 01/06/23   [name], MD  FLUoxetine (PROZAC) 40 MG capsule Take 1 capsule (40 mg total) by mouth daily for 14 days. 12/21/22 01/04/23  [name], [name]  HYDROcodone bit-homatropine (HYCODAN) 5-1.5 MG/5ML syrup Take 5 mLs by mouth every 6 (six) hours as needed for cough. 01/06/23   [name], MD  hydrOXYzine (VISTARIL) 25 MG capsule Take 1 capsule (25 mg total) by mouth every 8 (eight) hours as needed. 12/21/22   [name], [name]  ipratropium-albuterol (DUONEB) 0.5-2.5 (3) MG/3ML SOLN Take 3 mLs by nebulization every 4 (four) hours as needed (wheezing, shortness of breath). 12/21/22   [name], [name]  linaclotide (LINZESS) 290 MCG CAPS capsule Take 1 capsule (290 mcg total) by mouth daily before breakfast for 14 days. Patient not taking: Reported on 11/22/2022 11/23/22 12/07/22  [name], MD  melatonin 5 MG TABS Take 1 tablet (5 mg total) by mouth at bedtime. Patient not taking: Reported on 11/22/2022 11/22/22   [name], MD  naproxen (NAPROSYN) 500 MG tablet Take 1 tablet (500 mg total) by mouth 2 (two) times daily with a meal. 12/21/22   [name], [name]  pantoprazole (PROTONIX) 40 MG tablet Take  1 tablet (40 mg total) by mouth daily. 12/21/22   [name], [name]  polyethylene glycol (MIRALAX / GLYCOLAX) 17 g packet Take 17 g by mouth 2 (two) times daily. Patient not taking: Reported on 11/22/2022 11/22/22   [name], MD  predniSONE (DELTASONE) 20 MG tablet Take 2 tablets (40 mg total) by mouth daily with breakfast. 01/06/23   [name], [name], MD  risperiDONE (RISPERDAL) 1 MG tablet Take 1 tablet (1 mg total) by mouth at bedtime for 14 days. Patient not taking: Reported on 11/22/2022  11/22/22 12/06/22  [name], MD  VENTOLIN HFA 108 (90 Base) MCG/ACT inhaler Inhale 2 puffs into the lungs every 6 (six) hours as needed for wheezing or shortness of breath. 12/21/22   [name], [name]      Allergies    Patient has no known allergies.    Review of Systems   Review of Systems  Psychiatric/Behavioral:  Positive for suicidal ideas.   All other systems reviewed and are negative.   Physical Exam Updated Vital Signs BP (!) 157/99 (BP Location: Left Arm)   Pulse 81   Temp 98.2 F (36.8 C) (Oral)   Resp 20   Ht 5\' 10"  (1.778 m)   Wt 86.2 kg   SpO2 98%   BMI 27.26 kg/m  Physical Exam Vitals reviewed.  Constitutional:      Appearance: Normal appearance.  HENT:     Head: Normocephalic and atraumatic.     Nose: Nose normal.     Mouth/Throat:     Mouth: Mucous membranes are moist.  Eyes:     Extraocular Movements: Extraocular movements intact.     Conjunctiva/sclera: Conjunctivae normal.     Pupils: Pupils are equal, round, and reactive to light.  Cardiovascular:     Rate and Rhythm: Normal rate and regular rhythm.     Pulses: Normal pulses.     Heart sounds: Normal heart sounds.  Pulmonary:     Effort: Pulmonary effort is normal.     Breath sounds: Normal breath sounds.  Abdominal:     General: Abdomen is flat. Bowel sounds are normal.     Palpations: Abdomen is soft.  Musculoskeletal:        General: Normal range of motion.     Cervical back: Normal range of motion and neck supple.  Skin:    General: Skin is warm and dry.  Neurological:     General: No focal deficit present.     Mental Status: He is alert and oriented to person, place, and time. Mental status is at baseline.  Psychiatric:        Thought Content: Thought content normal.        Judgment: Judgment normal.     Comments: Active suicidal thoughts, tearful     ED Results / Procedures / Treatments   Labs (all labs ordered are listed, but only abnormal results are  displayed) Labs Reviewed  COMPREHENSIVE METABOLIC PANEL - Abnormal; Notable for the following components:      Result Value   Potassium 3.4 (*)    Glucose, Bld 102 (*)    All other components within normal limits  CBC WITH DIFFERENTIAL/PLATELET - Abnormal; Notable for the following components:   Abs Immature Granulocytes 0.08 (*)    All other components within normal limits  SALICYLATE LEVEL - Abnormal; Notable for the following components:   Salicylate Lvl <7.0 (*)    All other components within normal limits  ACETAMINOPHEN LEVEL - Abnormal; Notable  for the following components:   Acetaminophen (Tylenol), Serum <10 (*)    All other components within normal limits  ETHANOL  RAPID URINE DRUG SCREEN, HOSP PERFORMED    EKG None  Radiology No results found.  Procedures Procedures    Medications Ordered in ED Medications  LORazepam (ATIVAN) tablet 1 mg (1 mg Oral Given 03/22/23 1149)    ED Course/ Medical Decision Making/ A&P                                 Medical Decision Making Patient is medically clear for TTS at this time.  He has been given a dose of Ativan in the emergency department to help with his anxiety.  He does note that his last alcohol use was approximately 4 days ago and is no clinical indication for withdrawal at this point.  Amount and/or Complexity of Data Reviewed Labs: ordered.  Risk OTC drugs. Prescription drug management.           Final Clinical Impression(s) / ED Diagnoses Final diagnoses:  None    Rx / DC Orders ED Discharge Orders     None         Lelon Perla, PA-C 03/22/23 1507    Derwood Kaplan, MD 03/25/23 1034

## 2023-03-22 NOTE — ED Provider Notes (Signed)
Patient is medically clear for TTS at this time.   Lelon Perla, PA-C 03/22/23 1220    Derwood Kaplan, MD 03/25/23 1034

## 2023-03-22 NOTE — ED Notes (Signed)
Call made to dietary to request lunch try.

## 2023-03-22 NOTE — ED Notes (Signed)
CSW added substance use education/resources to patient AVS for inpatient and outpatient treatment. TOC signing off.

## 2023-03-22 NOTE — ED Triage Notes (Signed)
Pt transported by [facility] for SI. Pt has history of cocaine abuse and last used 4 days ago. Pt states he has taken approximately 8 ibuprofen today. Superficial laceration to left wrist pt states he used a kitchen knife. Pt states he has no way to get medications and he has not taken any medications in 3 months. Hx of strokes.

## 2023-03-22 NOTE — Consult Note (Signed)
[facility] Consult Note  Patient Name: [name] MRN: [id number] DOB: [birth year] DATE OF Consult: 03/22/2023  PRIMARY PSYCHIATRIC DIAGNOSES  1.  MDD, Recurrent, Moderate 2.  GAD 3.  Alcohol Use Disorder 4. Cocaine Use Disorder  RECOMMENDATIONS  Recommendations: Medication recommendations: Prozac 20 mg po daily for depression/anxiety; Seroquel 50 mg po at bedtime for mood stabilization/anxiety/insomnia Non-Medication/therapeutic recommendations: Inpatient psychiatric admission Is inpatient psychiatric hospitalization recommended for this patient? Yes (Explain why): Imminent danger to self Is another care setting recommended for this patient? (examples may include Crisis Stabilization Unit, Residential/Recovery Treatment, ALF/SNF, Memory Care Unit)  No (Explain why): Psychiatric stabilization needed From a psychiatric perspective, is this patient appropriate for discharge to an outpatient setting/resource or other less restrictive environment for continued care?  No (Explain why): Imminent danger to self Follow-Up Telepsychiatry C/L services: We will sign off for now. Please re-consult our service if needed for any concerning changes in the patient's condition, discharge planning, or questions. Communication: Treatment team members (and family members if applicable) who were involved in treatment/care discussions and planning, and with whom we spoke or engaged with via secure text/chat, include the following: Darl Pikes, RN; [place], [place]; [place], [facility]; Tresa Endo, RN, Wilkie Aye, RN; [place], [facility]  Thank you for involving [place] in the care of this patient. If you have any additional questions or concerns, please call [phone] and ask for me or the provider on-call.  TELEPSYCHIATRY ATTESTATION & CONSENT  As the provider for this telehealth consult, I attest that I verified the patient's identity using two separate identifiers, introduced myself to the patient, provided my credentials,  disclosed my location, and performed this encounter via a HIPAA-compliant, real-time, face-to-face, two-way, interactive audio and video platform and with the full consent and agreement of the patient (or guardian as applicable.)  Patient physical location: [facility]. Telehealth provider physical location: home office in state of [place].  Video start time: 1752 (Central Time) Video end time: 1807 (Central Time)  IDENTIFYING DATA  [name] is a 58 y.o. year-old male for whom a psychiatric consultation has been ordered by the primary provider. The patient was identified using two separate identifiers.  CHIEF COMPLAINT/REASON FOR CONSULT  SI with attempt  HISTORY OF PRESENT ILLNESS (HPI)  The patient presented to the ED with c/o SI with attempt to cut wrist.  Pt reported an increase in depression.  Pt reportedly made a superficial cut to his left wrist with a kitchen knife with thoughts of killing himself.  Pt reported "I got to the point where I'm suicidal".  Reported a hx of a stroke 2 years ago which significantly changed his life.  Reported a hx of depression with previous inpatient admissions and recent outpatient tx by psychiatry but hasn't been taking his medications lately because he ran out.  Reported memory deficits due to the stroke.  Reported he started drinking and doing drugs (cocaine) which he reportedly quit last week.  Reported a hx of manic episodes since his stroke lasting 2-3 days at a time.  Reported current sx of depressed mood, decreased sleep, decreased appetite, anhedonia, hopelessness, worthlessness and SI/IP.  Reported he hears his mother's voice all of the time and she has been deceased for 13 years.  Reported he no longer has a place to go because the people he was staying with are "tired of me".  Pt feels like he needs a longer term treatment program than just an acute psychiatric facility.  Reported he believed the Prozac was somewhat helpful but  not entirely.    Discussed adding a mood stabilizer which can also help with AH, anxiety and insomnia.  Pt is open to this as well as an inpatient psychiatric admission at this time.     PAST PSYCHIATRIC HISTORY   Otherwise as per HPI above.  PAST MEDICAL HISTORY  Past Medical History:  Diagnosis Date   Acid reflux    Emphysema of lung (HCC)    Head injury    Hepatitis C    S/p treatment with Epclusa in 2020   Hiatal hernia    HTN (hypertension)    Pulmonary nodules    Stroke [facility])      HOME MEDICATIONS  Facility Ordered Medications  Medication   [COMPLETED] LORazepam (ATIVAN) tablet 1 mg   LORazepam (ATIVAN) tablet 1-4 mg   Or   LORazepam (ATIVAN) injection 1-4 mg   thiamine (VITAMIN B1) tablet 100 mg   Or   thiamine (VITAMIN B1) injection 100 mg   folic acid (FOLVITE) tablet 1 mg   multivitamin with minerals tablet 1 tablet   PTA Medications  Medication Sig   risperiDONE (RISPERDAL) 1 MG tablet Take 1 tablet (1 mg total) by mouth at bedtime for 14 days. (Patient not taking: Reported on 11/22/2022)   docusate sodium (COLACE) 100 MG capsule Take 1 capsule (100 mg total) by mouth daily. (Patient not taking: Reported on 11/22/2022)   linaclotide (LINZESS) 290 MCG CAPS capsule Take 1 capsule (290 mcg total) by mouth daily before breakfast for 14 days. (Patient not taking: Reported on 11/22/2022)   polyethylene glycol (MIRALAX / GLYCOLAX) 17 g packet Take 17 g by mouth 2 (two) times daily. (Patient not taking: Reported on 11/22/2022)   melatonin 5 MG TABS Take 1 tablet (5 mg total) by mouth at bedtime. (Patient not taking: Reported on 11/22/2022)   naproxen (NAPROSYN) 500 MG tablet Take 1 tablet (500 mg total) by mouth 2 (two) times daily with a meal.   hydrOXYzine (VISTARIL) 25 MG capsule Take 1 capsule (25 mg total) by mouth every 8 (eight) hours as needed.   amLODipine (NORVASC) 10 MG tablet Take 1 tablet (10 mg total) by mouth daily for 14 days.   atorvastatin (LIPITOR) 20 MG tablet Take 1  tablet (20 mg total) by mouth daily for 14 days.   FLUoxetine (PROZAC) 40 MG capsule Take 1 capsule (40 mg total) by mouth daily for 14 days.   ipratropium-albuterol (DUONEB) 0.5-2.5 (3) MG/3ML SOLN Take 3 mLs by nebulization every 4 (four) hours as needed (wheezing, shortness of breath).   pantoprazole (PROTONIX) 40 MG tablet Take 1 tablet (40 mg total) by mouth daily.   VENTOLIN HFA 108 (90 Base) MCG/ACT inhaler Inhale 2 puffs into the lungs every 6 (six) hours as needed for wheezing or shortness of breath.   HYDROcodone bit-homatropine (HYCODAN) 5-1.5 MG/5ML syrup Take 5 mLs by mouth every 6 (six) hours as needed for cough.   doxycycline (VIBRAMYCIN) 100 MG capsule Take 1 capsule (100 mg total) by mouth 2 (two) times daily.   predniSONE (DELTASONE) 20 MG tablet Take 2 tablets (40 mg total) by mouth daily with breakfast.   albuterol (VENTOLIN HFA) 108 (90 Base) MCG/ACT inhaler Inhale 2 puffs into the lungs every 4 (four) hours as needed for wheezing or shortness of breath.   albuterol (PROVENTIL) (2.5 MG/3ML) 0.083% nebulizer solution Take 3 mLs (2.5 mg total) by nebulization every 4 (four) hours as needed for wheezing or shortness of breath.     ALLERGIES  No Known Allergies  SOCIAL & SUBSTANCE USE HISTORY  Social History   Socioeconomic History   Marital status: Single    Spouse name: Not on file   Number of children: Not on file   Years of education: Not on file   Highest education level: Not on file  Occupational History   Not on file  Tobacco Use   Smoking status: Former    Current packs/day: 2.00    Average packs/day: 2.0 packs/day for 30.0 years (60.0 ttl pk-yrs)    Types: Cigarettes   Smokeless tobacco: Never   Tobacco comments:    Quit for 8 months  Vaping Use   Vaping status: Never Used  Substance and Sexual Activity   Alcohol use: Yes    Comment: 6 pack a week; but last drank 08/12/22.   Drug use: Yes    Types: Cocaine    Comment: states its "been a long time"     Sexual activity: Not Currently  Other Topics Concern   Not on file  Social History Narrative   Not on file   Social Drivers of Health   Financial Resource Strain: Low Risk  (12/10/2022)   Overall Financial Resource Strain (CARDIA)    Difficulty of Paying Living Expenses: Not very hard  Food Insecurity: No Food Insecurity (02/11/2023)   Hunger Vital Sign    Worried About Running Out of Food in the Last Year: Never true    Ran Out of Food in the Last Year: Never true  Transportation Needs: No Transportation Needs (11/11/2022)   PRAPARE - Administrator, Civil Service (Medical): No    Lack of Transportation (Non-Medical): No  Physical Activity: Insufficiently Active (01/11/2023)   Exercise Vital Sign    Days of Exercise per Week: 7 days    Minutes of Exercise per Session: 10 min  Stress: No Stress Concern Present (12/10/2022)   [employer] of Occupational Health - Occupational Stress Questionnaire    Feeling of Stress : Not at all  Social Connections: Unknown (01/11/2023)   Social Connection and Isolation Panel [NHANES]    Frequency of Communication with Friends and Family: Patient unable to answer    Frequency of Social Gatherings with Friends and Family: Patient unable to answer    Attends Religious Services: Never    Database administrator or Organizations: No    Attends Engineer, structural: Never    Marital Status: Living with partner   Social History   Tobacco Use  Smoking Status Former   Current packs/day: 2.00   Average packs/day: 2.0 packs/day for 30.0 years (60.0 ttl pk-yrs)   Types: Cigarettes  Smokeless Tobacco Never  Tobacco Comments   Quit for 8 months   Social History   Substance and Sexual Activity  Alcohol Use Yes   Comment: 6 pack a week; but last drank 08/12/22.   Social History   Substance and Sexual Activity  Drug Use Yes   Types: Cocaine   Comment: states its "been a long time"     Additional pertinent information  Homeless now, on disability.  FAMILY HISTORY  Family History  Problem Relation Age of Onset   Diabetes Mother    Hypertension Mother    Stroke Father    Hypertension Father    Colon cancer Neg Hx    Family Psychiatric History (if known):  Father - ETOH  MENTAL STATUS EXAM (MSE)  Mental Status Exam: General Appearance: Disheveled  Orientation:  Full (Time, Place,  and Person)  Memory:  Immediate;   Good Recent;   Fair Remote;   Fair  Concentration:  Concentration: Good and Attention Span: Good  Recall:  Good  Attention  Good  Eye Contact:  Good  Speech:  Pressured  Language:  Good  Volume:  Normal  Mood: Depressed, anxious  Affect:  Depressed, Flat, and Tearful  Thought Process:  Coherent  Thought Content:  Hallucinations: Auditory  Suicidal Thoughts:  Yes.  with intent/plan  Homicidal Thoughts:  No  Judgement:  Fair  Insight:  Fair  Psychomotor Activity:  Restlessness  Akathisia:  No  Fund of Knowledge:  Good    Assets:  Communication Skills Desire for Improvement Financial Resources/Insurance Social Support  Cognition:  WNL  ADL's:  Intact  AIMS (if indicated):       VITALS  Blood pressure (!) 157/99, pulse 81, temperature 98.2 F (36.8 C), temperature source Oral, resp. rate 20, height 5\' 10"  (1.778 m), weight 86.2 kg, SpO2 98%.  LABS  Admission on 03/22/2023  Component Date Value Ref Range Status   Sodium 03/22/2023 138  135 - 145 mmol/L Final   Potassium 03/22/2023 3.4 (L)  3.5 - 5.1 mmol/L Final   Chloride 03/22/2023 104  98 - 111 mmol/L Final   CO2 03/22/2023 26  22 - 32 mmol/L Final   Glucose, Bld 03/22/2023 102 (H)  70 - 99 mg/dL Final   Glucose reference range applies only to samples taken after fasting for at least 8 hours.   BUN 03/22/2023 20  6 - 20 mg/dL Final   Creatinine, Ser 03/22/2023 0.89  0.61 - 1.24 mg/dL Final   Calcium 40/98/1191 9.2  8.9 - 10.3 mg/dL Final   Total Protein 47/82/9562 6.9  6.5 - 8.1 g/dL Final   Albumin 13/09/6576 3.7   3.5 - 5.0 g/dL Final   AST 46/96/2952 18  15 - 41 U/L Final   ALT 03/22/2023 28  0 - 44 U/L Final   Alkaline Phosphatase 03/22/2023 80  38 - 126 U/L Final   Total Bilirubin 03/22/2023 0.7  0.0 - 1.2 mg/dL Final   GFR, Estimated 03/22/2023 >60  >60 mL/min Final   Comment: (NOTE) Calculated using the CKD-EPI Creatinine Equation (2021)    Anion gap 03/22/2023 8  5 - 15 Final   Performed at [facility], [address], [place], [place] [address]   Alcohol, Ethyl (B) 03/22/2023 <10  <10 mg/dL Final   Comment: (NOTE) Lowest detectable limit for serum alcohol is 10 mg/dL.  For medical purposes only. Performed at [facility], [address] [place], [place] [address]    WBC 03/22/2023 7.9  4.0 - 10.5 K/uL Final   RBC 03/22/2023 4.79  4.22 - 5.81 MIL/uL Final   Hemoglobin 03/22/2023 14.9  13.0 - 17.0 g/dL Final   HCT 27/25/3664 45.3  39.0 - 52.0 % Final   MCV 03/22/2023 94.6  80.0 - 100.0 fL Final   MCH 03/22/2023 31.1  26.0 - 34.0 pg Final   MCHC 03/22/2023 32.9  30.0 - 36.0 g/dL Final   RDW 40/34/7425 13.9  11.5 - 15.5 % Final   Platelets 03/22/2023 259  150 - 400 K/uL Final   nRBC 03/22/2023 0.0  0.0 - 0.2 % Final   Neutrophils Relative % 03/22/2023 67  % Final   Neutro Abs 03/22/2023 5.4  1.7 - 7.7 K/uL Final   Lymphocytes Relative 03/22/2023 21  % Final   Lymphs Abs 03/22/2023 1.6  0.7 - 4.0  K/uL Final   Monocytes Relative 03/22/2023 7  % Final   Monocytes Absolute 03/22/2023 0.6  0.1 - 1.0 K/uL Final   Eosinophils Relative 03/22/2023 3  % Final   Eosinophils Absolute 03/22/2023 0.2  0.0 - 0.5 K/uL Final   Basophils Relative 03/22/2023 1  % Final   Basophils Absolute 03/22/2023 0.1  0.0 - 0.1 K/uL Final   Immature Granulocytes 03/22/2023 1  % Final   Abs Immature Granulocytes 03/22/2023 0.08 (H)  0.00 - 0.07 K/uL Final   Performed at [facility], [address] [place], [place] [address]   Salicylate Lvl 03/22/2023 <7.0 (L)  7.0 - 30.0 mg/dL Final   Performed at [facility], [address], [place], [place] [address]   Acetaminophen (Tylenol), Serum 03/22/2023 <10 (L)  10 - 30 ug/mL Final   Comment: (NOTE) Therapeutic concentrations vary significantly. A range of 10-30 ug/mL  may be an effective concentration for many patients. However, some  are best treated at concentrations outside of this range. Acetaminophen concentrations >150 ug/mL at 4 hours after ingestion  and >50 ug/mL at 12 hours after ingestion are often associated with  toxic reactions.  Performed at [facility], [address] [place], [place] [address]     PSYCHIATRIC REVIEW OF SYSTEMS (ROS)  ROS: Notable for the following relevant positive findings: Review of Systems  Psychiatric/Behavioral:  Positive for depression, hallucinations, memory loss, substance abuse and suicidal ideas. The patient is nervous/anxious and has insomnia.     Additional findings:      Musculoskeletal: No abnormal movements observed      Gait & Station: Normal      Pain Screening: Denies      Nutrition & Dental Concerns: If yes - consider referral to nutritional or dental specialist  RISK FORMULATION/ASSESSMENT  Is the patient experiencing any suicidal or homicidal ideations: Yes       Explain if yes: SI/IP with attempt to cut left wrist Protective factors considered for safety management: Willing to get help, social supports  Risk factors/concerns considered for safety management:  Depression Substance abuse/dependence Physical illness/chronic pain Recent loss Access to lethal means Hopelessness Impulsivity Male gender Unmarried  Is there a safety management plan with the patient and treatment team to minimize risk factors and promote protective factors: Yes           Explain: Medication, inpatient psychiatric admission Is crisis care placement or psychiatric hospitalization recommended: Yes     Based on my current evaluation and risk assessment, patient is determined at this time to be at:  High  risk  *RISK ASSESSMENT Risk assessment is a dynamic process; it is possible that this patient's condition, and risk level, may change. This should be re-evaluated and managed over time as appropriate. Please re-consult psychiatric consult services if additional assistance is needed in terms of risk assessment and management. If your team decides to discharge this patient, please advise the patient how to best access emergency psychiatric services, or to call 911, if their condition worsens or they feel unsafe in any way.   Harlene Salts, NP Telepsychiatry Consult Services

## 2023-03-22 NOTE — BH Assessment (Signed)
Attempting to assess patients. Please secure chat me when someone is ready to bring the cart in.

## 2023-03-22 NOTE — ED Notes (Signed)
Pt sitting up eating lunch tray

## 2023-03-22 NOTE — BH Assessment (Addendum)
Disposition Note:   As per the [facility] consult by Paschal Dopp, NP, the patient is recommended for inpatient treatment. @ 2017, a request was made for [facility] Fransico Michael, RN) to review the patient for admission to [facility]. Patient remains under review at this time.

## 2023-03-23 ENCOUNTER — Inpatient Hospital Stay (HOSPITAL_COMMUNITY)
Admission: AD | Admit: 2023-03-23 | Discharge: 2023-03-31 | DRG: 885 | Disposition: A | Discharge status: Home / Self Care | Payer: MEDICAID | Source: Intra-hospital | Attending: Psychiatry | Admitting: Psychiatry

## 2023-03-23 ENCOUNTER — Encounter (HOSPITAL_COMMUNITY): Payer: Self-pay | Admitting: Family

## 2023-03-23 ENCOUNTER — Other Ambulatory Visit: Payer: Self-pay

## 2023-03-23 DIAGNOSIS — R45851 Suicidal ideations: Secondary | ICD-10-CM | POA: Diagnosis present

## 2023-03-23 DIAGNOSIS — Z8249 Family history of ischemic heart disease and other diseases of the circulatory system: Secondary | ICD-10-CM

## 2023-03-23 DIAGNOSIS — Z9151 Personal history of suicidal behavior: Secondary | ICD-10-CM

## 2023-03-23 DIAGNOSIS — Z8619 Personal history of other infectious and parasitic diseases: Secondary | ICD-10-CM

## 2023-03-23 DIAGNOSIS — I10 Essential (primary) hypertension: Secondary | ICD-10-CM | POA: Diagnosis present

## 2023-03-23 DIAGNOSIS — F331 Major depressive disorder, recurrent, moderate: Secondary | ICD-10-CM | POA: Diagnosis not present

## 2023-03-23 DIAGNOSIS — E785 Hyperlipidemia, unspecified: Secondary | ICD-10-CM | POA: Diagnosis present

## 2023-03-23 DIAGNOSIS — J439 Emphysema, unspecified: Secondary | ICD-10-CM | POA: Diagnosis present

## 2023-03-23 DIAGNOSIS — F411 Generalized anxiety disorder: Secondary | ICD-10-CM | POA: Insufficient documentation

## 2023-03-23 DIAGNOSIS — Z79899 Other long term (current) drug therapy: Secondary | ICD-10-CM

## 2023-03-23 DIAGNOSIS — F1090 Alcohol use, unspecified, uncomplicated: Secondary | ICD-10-CM | POA: Diagnosis present

## 2023-03-23 DIAGNOSIS — Z823 Family history of stroke: Secondary | ICD-10-CM

## 2023-03-23 DIAGNOSIS — Z716 Tobacco abuse counseling: Secondary | ICD-10-CM

## 2023-03-23 DIAGNOSIS — F1010 Alcohol abuse, uncomplicated: Secondary | ICD-10-CM | POA: Diagnosis present

## 2023-03-23 DIAGNOSIS — F333 Major depressive disorder, recurrent, severe with psychotic symptoms: Principal | ICD-10-CM | POA: Diagnosis present

## 2023-03-23 DIAGNOSIS — F1510 Other stimulant abuse, uncomplicated: Secondary | ICD-10-CM | POA: Diagnosis present

## 2023-03-23 DIAGNOSIS — F17210 Nicotine dependence, cigarettes, uncomplicated: Secondary | ICD-10-CM | POA: Diagnosis present

## 2023-03-23 DIAGNOSIS — F332 Major depressive disorder, recurrent severe without psychotic features: Principal | ICD-10-CM | POA: Diagnosis present

## 2023-03-23 DIAGNOSIS — Z833 Family history of diabetes mellitus: Secondary | ICD-10-CM

## 2023-03-23 DIAGNOSIS — F1590 Other stimulant use, unspecified, uncomplicated: Secondary | ICD-10-CM | POA: Diagnosis present

## 2023-03-23 DIAGNOSIS — Z8673 Personal history of transient ischemic attack (TIA), and cerebral infarction without residual deficits: Secondary | ICD-10-CM | POA: Diagnosis not present

## 2023-03-23 MED ORDER — PANTOPRAZOLE SODIUM 40 MG PO TBEC
40.0000 mg | DELAYED_RELEASE_TABLET | Freq: Every day | ORAL | Status: DC
  Administered 2023-03-23 – 2023-03-31 (×9): 40 mg via ORAL
  Filled 2023-03-23 (×12): qty 1

## 2023-03-23 MED ORDER — THIAMINE HCL 100 MG/ML IJ SOLN
100.0000 mg | Freq: Every day | INTRAMUSCULAR | Status: DC
Start: 2023-03-24 — End: 2023-03-23

## 2023-03-23 MED ORDER — HALOPERIDOL LACTATE 5 MG/ML IJ SOLN: 10.0000 mg | Freq: Three times a day (TID) | INTRAMUSCULAR | Status: DC | PRN

## 2023-03-23 MED ORDER — AMLODIPINE BESYLATE 10 MG PO TABS
10.0000 mg | ORAL_TABLET | Freq: Every day | ORAL | Status: DC
  Administered 2023-03-24 – 2023-03-25 (×2): 10 mg via ORAL
  Filled 2023-03-23 (×4): qty 1

## 2023-03-23 MED ORDER — DIPHENHYDRAMINE HCL 50 MG/ML IJ SOLN: 50.0000 mg | Freq: Three times a day (TID) | INTRAMUSCULAR | Status: DC | PRN

## 2023-03-23 MED ORDER — MAGNESIUM HYDROXIDE 400 MG/5ML PO SUSP: 30.0000 mL | Freq: Every day | ORAL | Status: DC | PRN

## 2023-03-23 MED ORDER — RISPERIDONE 1 MG PO TABS
1.0000 mg | ORAL_TABLET | Freq: Every day | ORAL | Status: DC
Start: 2023-03-23 — End: 2023-03-24
  Administered 2023-03-23: 1 mg via ORAL
  Filled 2023-03-23 (×4): qty 1

## 2023-03-23 MED ORDER — DOCUSATE SODIUM 100 MG PO CAPS
100.0000 mg | ORAL_CAPSULE | Freq: Every day | ORAL | Status: DC
  Administered 2023-03-23 – 2023-03-31 (×9): 100 mg via ORAL
  Filled 2023-03-23 (×12): qty 1

## 2023-03-23 MED ORDER — FOLIC ACID 1 MG PO TABS
1.0000 mg | ORAL_TABLET | Freq: Every day | ORAL | Status: DC
  Administered 2023-03-24 – 2023-03-31 (×8): 1 mg via ORAL
  Filled 2023-03-23 (×10): qty 1

## 2023-03-23 MED ORDER — HYDROXYZINE PAMOATE 25 MG PO CAPS: 25.0000 mg | ORAL_CAPSULE | Freq: Three times a day (TID) | ORAL | Status: DC | PRN

## 2023-03-23 MED ORDER — CLONIDINE HCL 0.1 MG PO TABS
0.1000 mg | ORAL_TABLET | ORAL | Status: DC | PRN
  Administered 2023-03-25 – 2023-03-31 (×3): 0.1 mg via ORAL
  Filled 2023-03-23 (×3): qty 1

## 2023-03-23 MED ORDER — AMLODIPINE BESYLATE 5 MG PO TABS
10.0000 mg | ORAL_TABLET | Freq: Every day | ORAL | Status: DC
  Administered 2023-03-23: 10 mg via ORAL
  Filled 2023-03-23: qty 2

## 2023-03-23 MED ORDER — HYDROXYZINE HCL 25 MG PO TABS
25.0000 mg | ORAL_TABLET | Freq: Three times a day (TID) | ORAL | Status: DC | PRN
  Administered 2023-03-24 – 2023-03-30 (×9): 25 mg via ORAL
  Filled 2023-03-23 (×11): qty 1

## 2023-03-23 MED ORDER — HALOPERIDOL LACTATE 5 MG/ML IJ SOLN: 5.0000 mg | Freq: Three times a day (TID) | INTRAMUSCULAR | Status: DC | PRN

## 2023-03-23 MED ORDER — HALOPERIDOL 5 MG PO TABS
5.0000 mg | ORAL_TABLET | Freq: Three times a day (TID) | ORAL | Status: DC | PRN
  Administered 2023-03-25 (×2): 5 mg via ORAL
  Filled 2023-03-23 (×2): qty 1

## 2023-03-23 MED ORDER — LORAZEPAM 2 MG/ML IJ SOLN: 2.0000 mg | Freq: Three times a day (TID) | INTRAMUSCULAR | Status: DC | PRN

## 2023-03-23 MED ORDER — VITAMIN B-1 100 MG PO TABS
100.0000 mg | ORAL_TABLET | Freq: Every day | ORAL | Status: DC
Start: 2023-03-24 — End: 2023-03-31
  Administered 2023-03-24 – 2023-03-31 (×8): 100 mg via ORAL
  Filled 2023-03-23 (×10): qty 1

## 2023-03-23 MED ORDER — FLUOXETINE HCL 20 MG PO CAPS
20.0000 mg | ORAL_CAPSULE | Freq: Every day | ORAL | Status: DC
  Administered 2023-03-24 – 2023-03-25 (×2): 20 mg via ORAL
  Filled 2023-03-23 (×4): qty 1

## 2023-03-23 MED ORDER — ACETAMINOPHEN 325 MG PO TABS
650.0000 mg | ORAL_TABLET | Freq: Four times a day (QID) | ORAL | Status: DC | PRN
  Administered 2023-03-24 – 2023-03-30 (×9): 650 mg via ORAL
  Filled 2023-03-23 (×9): qty 2

## 2023-03-23 MED ORDER — ALUM & MAG HYDROXIDE-SIMETH 200-200-20 MG/5ML PO SUSP
30.0000 mL | ORAL | Status: DC | PRN
  Administered 2023-03-23 – 2023-03-24 (×2): 30 mL via ORAL
  Filled 2023-03-23 (×2): qty 30

## 2023-03-23 MED ORDER — ALBUTEROL SULFATE HFA 108 (90 BASE) MCG/ACT IN AERS: 2.0000 | INHALATION_SPRAY | RESPIRATORY_TRACT | Status: DC | PRN

## 2023-03-23 MED ORDER — ADULT MULTIVITAMIN W/MINERALS CH
1.0000 | ORAL_TABLET | Freq: Every day | ORAL | Status: DC
Start: 2023-03-24 — End: 2023-03-31
  Administered 2023-03-24 – 2023-03-31 (×8): 1 via ORAL
  Filled 2023-03-23 (×10): qty 1

## 2023-03-23 MED ORDER — ATORVASTATIN CALCIUM 20 MG PO TABS
20.0000 mg | ORAL_TABLET | Freq: Every day | ORAL | Status: DC
  Administered 2023-03-23 – 2023-03-31 (×9): 20 mg via ORAL
  Filled 2023-03-23 (×2): qty 1
  Filled 2023-03-23: qty 2
  Filled 2023-03-23 (×9): qty 1

## 2023-03-23 MED ORDER — QUETIAPINE FUMARATE 50 MG PO TABS
50.0000 mg | ORAL_TABLET | Freq: Every day | ORAL | Status: DC
  Administered 2023-03-23: 50 mg via ORAL
  Filled 2023-03-23 (×4): qty 1

## 2023-03-23 MED ORDER — DIPHENHYDRAMINE HCL 25 MG PO CAPS
50.0000 mg | ORAL_CAPSULE | Freq: Three times a day (TID) | ORAL | Status: DC | PRN
  Administered 2023-03-25: 50 mg via ORAL
  Filled 2023-03-23: qty 2

## 2023-03-23 MED ORDER — LORAZEPAM 2 MG/ML IJ SOLN: 1.0000 mg | INTRAMUSCULAR | Status: DC | PRN

## 2023-03-23 MED ORDER — LORAZEPAM 1 MG PO TABS
1.0000 mg | ORAL_TABLET | ORAL | Status: AC
  Administered 2023-03-23: 1 mg via ORAL
  Filled 2023-03-23: qty 1

## 2023-03-23 MED ORDER — LORAZEPAM 1 MG PO TABS
1.0000 mg | ORAL_TABLET | ORAL | Status: AC | PRN
  Administered 2023-03-24: 1 mg via ORAL
  Administered 2023-03-24: 2 mg via ORAL
  Filled 2023-03-23: qty 2
  Filled 2023-03-23: qty 1

## 2023-03-23 NOTE — Group Note (Signed)
Date:  03/23/2023 Time:  9:29 PM  Group Topic/Focus:  NA (Narcotics Anonymous) Meeting    Participation Level:  Did Not Attend  Participation Quality:   n/a  Affect:   n/a  Cognitive:   n/a  Insight: None  Engagement in Group:   n/a  Modes of Intervention:   n/a  Additional Comments:  Patient did not attend NA Meeting.  [name] 03/23/2023, 9:29 PM

## 2023-03-23 NOTE — Plan of Care (Signed)
  Problem: Education: Goal: Emotional status will improve Outcome: Progressing Goal: Verbalization of understanding the information provided will improve Outcome: Progressing   Problem: Coping: Goal: Ability to verbalize frustrations and anger appropriately will improve Outcome: Progressing Goal: Ability to demonstrate self-control will improve Outcome: Progressing   Problem: Health Behavior/Discharge Planning: Goal: Identification of resources available to assist in meeting health care needs will improve Outcome: Progressing   Problem: Physical Regulation: Goal: Ability to maintain clinical measurements within normal limits will improve Outcome: Progressing

## 2023-03-23 NOTE — ED Notes (Signed)
Safe transport called to transport patient to [facility]. Paramedic notified

## 2023-03-23 NOTE — Progress Notes (Signed)
   03/23/23 2200  Psych Admission Type (Psych Patients Only)  Admission Status Voluntary  Psychosocial Assessment  Patient Complaints Anxiety;Depression;Substance abuse  Eye Contact Fair  Facial Expression Animated  Affect Silly  Speech Logical/coherent;Slurred;Soft  Interaction Assertive  Motor Activity Slow  Appearance/Hygiene Unremarkable  Behavior Characteristics Cooperative;Calm  Mood Depressed;Anxious;Pleasant  Thought Process  Coherency WDL  Content WDL  Delusions None reported or observed  Perception WDL  Hallucination Auditory (mom's voices)  Judgment Poor  Confusion None  Danger to Self  Current suicidal ideation? Passive  Self-Injurious Behavior No self-injurious ideation or behavior indicators observed or expressed   Agreement Not to Harm Self Yes  Description of Agreement verbal  Danger to Others  Danger to Others None reported or observed

## 2023-03-23 NOTE — Progress Notes (Signed)
Pt has been accepted to [facility] on 02/12/202  Bed assignment: 307-1  Pt meets inpatient criteria per: Ophelia Shoulder NP  Attending Physician will be: [name], MD   Report can be called to: Adult unit: [phone]  Pt can arrive after Pt must sign the voluntary consent and fax that to [phone] and send original with pt at transport.   Care Team Notified: [facility] Malva Limes RN, Estill Cotta NP, Arby Barrette paramedic  [place] Cecelia Graciano LCSW-A   03/23/2023 10:12 AM

## 2023-03-23 NOTE — Progress Notes (Addendum)
[facility] Admission Note:  Patient is a 58 y.o. M who was voluntarily admitted to [facility] from [facility] ED for suicidal ideation and consuming 8 ibuprofen tablets and cutting left wrist. Patient has a history of hypertension, CVA and alcohol and polysubstance use disorder. UDS positve for cocaine, benzodiazepines and THC. On admission patient endorses passive SI, denies HI and VH and states "I hear my mother's voice all the time." Patient rates depression and anxiety both 10/10. Patient is silly on interaction stating "I'm going to take a shower do you want to come with me?" While laughing and smiling.   Patient packet was provided, consents were signed and skin was assessed with Coy Saunas, MHT. Skin found to be WNL with the exception of superficial laceration to left wrist and healed surgical scar on right upper chest/ axilla. Patient's belongings were searched per unit policy and items not permitted on the unit were placed in locker # 25. Patient was oriented to the unit and Q 15 minute safety checks were initiated.

## 2023-03-23 NOTE — Tx Team (Signed)
Initial Treatment Plan 03/23/2023 4:25 PM [name] [id number]    PATIENT STRESSORS: Medication change or noncompliance   Substance abuse     PATIENT STRENGTHS: Communication skills  Motivation for treatment/growth  Special hobby/interest    PATIENT IDENTIFIED PROBLEMS: "My last drink was 4 days ago."  "I need long-term treatment"   "I drink  gallon of liquor per day."                 DISCHARGE CRITERIA:  Improved stabilization in mood, thinking, and/or behavior Motivation to continue treatment in a less acute level of care  PRELIMINARY DISCHARGE PLAN: Attend PHP/IOP Attend 12-step recovery group Outpatient therapy  PATIENT/FAMILY INVOLVEMENT: This treatment plan has been presented to and reviewed with the patient, [name].  The patient and family have been given the opportunity to ask questions and make suggestions.  Karn Pickler, RN 03/23/2023, 4:25 PM

## 2023-03-23 NOTE — Progress Notes (Signed)
   03/23/23 1600  Psych Admission Type (Psych Patients Only)  Admission Status Voluntary  Psychosocial Assessment  Patient Complaints Anxiety;Depression;Substance abuse  Eye Contact Fair  Facial Expression Animated  Affect Silly  Speech Logical/coherent;Slurred;Soft  Interaction Assertive  Motor Activity Slow  Appearance/Hygiene Unremarkable  Behavior Characteristics Cooperative;Calm  Mood Depressed;Anxious;Pleasant  Thought Process  Coherency WDL  Content WDL  Delusions None reported or observed  Perception WDL  Hallucination Auditory (mom's voice "all the time")  Judgment Poor  Confusion None  Danger to Self  Current suicidal ideation? Passive  Self-Injurious Behavior No self-injurious ideation or behavior indicators observed or expressed   Agreement Not to Harm Self Yes  Description of Agreement verbal  Danger to Others  Danger to Others None reported or observed

## 2023-03-24 DIAGNOSIS — F333 Major depressive disorder, recurrent, severe with psychotic symptoms: Secondary | ICD-10-CM | POA: Diagnosis not present

## 2023-03-24 DIAGNOSIS — F411 Generalized anxiety disorder: Secondary | ICD-10-CM | POA: Insufficient documentation

## 2023-03-24 MED ORDER — MELATONIN 3 MG PO TABS
3.0000 mg | ORAL_TABLET | Freq: Every day | ORAL | Status: DC
  Administered 2023-03-24 – 2023-03-30 (×7): 3 mg via ORAL
  Filled 2023-03-24 (×10): qty 1

## 2023-03-24 MED ORDER — NALTREXONE HCL 50 MG PO TABS
25.0000 mg | ORAL_TABLET | Freq: Every day | ORAL | Status: AC
  Administered 2023-03-24 – 2023-03-25 (×2): 25 mg via ORAL
  Filled 2023-03-24 (×2): qty 1

## 2023-03-24 MED ORDER — NALTREXONE HCL 50 MG PO TABS
50.0000 mg | ORAL_TABLET | Freq: Every day | ORAL | Status: DC
  Administered 2023-03-26 – 2023-03-31 (×6): 50 mg via ORAL
  Filled 2023-03-24 (×8): qty 1

## 2023-03-24 NOTE — [facility] Suicide Risk Assessment (Signed)
[facility] Admission Suicide Risk Assessment   Nursing information obtained from:  Patient Demographic factors:  Male, Low socioeconomic status Current Mental Status:  Suicidal ideation indicated by patient Loss Factors:  Financial problems / change in socioeconomic status, Loss of significant relationship Historical Factors:  Impulsivity Risk Reduction Factors:  Positive coping skills or problem solving skills  Total Time spent with patient: 30 minutes Principal Problem: MDD (major depressive disorder), recurrent, severe, with psychosis (HCC) Diagnosis:  Principal Problem:   MDD (major depressive disorder), recurrent, severe, with psychosis (HCC) Active Problems:   Stimulant use disorder   Alcohol use disorder   GAD (generalized anxiety disorder)  Subjective Data: See H&P.  Patient admitted for suicidal thoughts and cutting himself superficially with the kitchen knife.  He continues to report having SI.  He reports he is hopeless and has nothing to live for.  He has been nonadherent with outpatient treatment or medications.  He is homeless.  He is using alcohol and cocaine.  Continued Clinical Symptoms:  Alcohol Use Disorder Identification Test Final Score (AUDIT): 36 The "Alcohol Use Disorders Identification Test", Guidelines for Use in Primary Care, Second Edition.  World Science writer [facility]). Score between 0-7:  no or low risk or alcohol related problems. Score between 8-15:  moderate risk of alcohol related problems. Score between 16-19:  high risk of alcohol related problems. Score 20 or above:  warrants further diagnostic evaluation for alcohol dependence and treatment.   CLINICAL FACTORS:   Severe Anxiety and/or Agitation Depression:   Anhedonia Comorbid alcohol abuse/dependence Hopelessness Impulsivity Insomnia Severe Alcohol/Substance Abuse/Dependencies More than one psychiatric diagnosis Unstable or Poor Therapeutic Relationship Previous Psychiatric Diagnoses and  Treatments     Psychiatric Specialty Exam:  Presentation  General Appearance:  Disheveled  Eye Contact: Fair  Speech: Normal Rate  Speech Volume: Decreased  Handedness: Right   Mood and Affect  Mood: Anxious; Depressed  Affect: Full Range; Tearful; Congruent   Thought Process  Thought Processes: Linear  Descriptions of Associations:Intact  Orientation:Full (Time, Place and Person)  Thought Content:Logical  History of Schizophrenia/Schizoaffective disorder:No data recorded Duration of Psychotic Symptoms:No data recorded Hallucinations:Hallucinations: Auditory Description of Auditory Hallucinations: Mother's voice, chronic  Ideas of Reference:None  Suicidal Thoughts:Suicidal Thoughts: Yes, Passive SI Passive Intent and/or Plan: Without Intent; Without Plan  Homicidal Thoughts:Homicidal Thoughts: No   Sensorium  Memory: Immediate Fair; Recent Fair; Remote Fair  Judgment: Impaired  Insight: Lacking   Executive Functions  Concentration: Poor  Attention Span: Poor  Recall: Good  Fund of Knowledge: Good  Language: Good   Psychomotor Activity  Psychomotor Activity: Psychomotor Activity: Normal   Assets  Assets: Communication Skills   Sleep  Sleep: Sleep: Poor    Physical Exam: Physical Exam see H&P  ROS see H&P  Blood pressure (!) 141/98, pulse 90, temperature 97.6 F (36.4 C), temperature source Oral, resp. rate 16, height 5\' 10"  (1.778 m), weight 89.4 kg, SpO2 95%. Body mass index is 28.27 kg/m.   COGNITIVE FEATURES THAT CONTRIBUTE TO RISK:  None    SUICIDE RISK:   Severe:  Frequent, intense, and enduring suicidal ideation, specific plan, no subjective intent at this time, but some objective markers of intent (i.e., choice of lethal method leading to the admission using a knife to cut himself superficially), the method is accessible, some limited preparatory behavior, evidence of impaired self-control, severe  dysphoria/symptomatology, multiple risk factors present, and few if any protective factors, particularly a lack of social support.  PLAN OF CARE: See H&P  I certify that inpatient services furnished can reasonably be expected to improve the patient's condition.   [name], MD 03/24/2023, 1:57 PM

## 2023-03-24 NOTE — Plan of Care (Signed)
  Problem: Education: Goal: Emotional status will improve Outcome: Progressing Goal: Verbalization of understanding the information provided will improve Outcome: Progressing   Problem: Activity: Goal: Sleeping patterns will improve Outcome: Progressing   Problem: Coping: Goal: Ability to verbalize frustrations and anger appropriately will improve Outcome: Progressing Goal: Ability to demonstrate self-control will improve Outcome: Progressing   Problem: Health Behavior/Discharge Planning: Goal: Compliance with treatment plan for underlying cause of condition will improve Outcome: Progressing

## 2023-03-24 NOTE — Group Note (Signed)
LCSW Group Therapy Note   Group Date: 03/24/2023 Start Time: 1100 End Time: 1200   Participation:  did not attend  Type of Therapy:  Group Therapy  Title:  "Shining from Within: Confidence and Self-Love Journey"  Objective:  The focus of today's session is to explore how confidence and self-love can be nurtured over time through self-compassion, recognizing strengths, and taking small, intentional steps.  Goals: Foster self-love and acceptance by embracing strengths and imperfections. Develop confidence through actionable steps and mindset shifts. Practice patience during personal growth, acknowledging setbacks as part of the journey.  Summary:  This session focused on self-love as the foundation for confidence.  Participants practiced helpful self-talk, identified strengths, set small goals, and reflected on achievements and social support.  It emphasized that building confidence is a continuous process requiring patience and self-care.  Therapeutic Modalities: Cognitive Behavioral Therapy (CBT): Used to challenge and replace unhelpful self-talk with more supportive thoughts, enhancing self-esteem. Mindfulness and Self-Compassion Practices: Encourages reflection on strengths, gratitude, and the creation of a positive environment to foster a sense of well-being.   Alla Feeling, LCSWA 03/24/2023  6:51 PM

## 2023-03-24 NOTE — Plan of Care (Signed)
Problem: Education: Goal: Knowledge of [facility] Education information/materials will improve Outcome: Progressing Goal: Emotional status will improve Outcome: Progressing Goal: Mental status will improve Outcome: Progressing Goal: Verbalization of understanding the information provided will improve Outcome: Progressing

## 2023-03-24 NOTE — Progress Notes (Signed)
   03/24/23 1100  Psych Admission Type (Psych Patients Only)  Admission Status Voluntary  Psychosocial Assessment  Patient Complaints Agitation;Irritability  Eye Contact Fair  Facial Expression Animated  Affect Silly;Preoccupied  Speech Logical/coherent;Slurred;Soft  Interaction Assertive  Motor Activity Slow  Appearance/Hygiene Unremarkable  Behavior Characteristics Cooperative;Calm  Mood Depressed;Anxious;Pleasant  Thought Process  Coherency WDL  Content WDL  Delusions None reported or observed  Perception WDL  Hallucination None reported or observed  Judgment Poor  Confusion None  Danger to Self  Current suicidal ideation? Denies  Agreement Not to Harm Self Yes  Description of Agreement verbal  Danger to Others  Danger to Others None reported or observed
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
## 2023-03-24 NOTE — Group Note (Signed)
Date:  03/24/2023 Time:  11:37 AM  Group Topic/Focus:  Orientation:   The focus of this group is to educate the patient on the purpose and policies of crisis stabilization and provide a format to answer questions about their admission.  The group details unit policies and expectations of patients while admitted.    Participation Level:  Minimal  Participation Quality:  Appropriate  Affect:  Appropriate  Cognitive:  Appropriate  Insight: Appropriate  Engagement in Group:  Lacking and Limited  Modes of Intervention:  Discussion and Orientation  Additional Comments:    Arvis Miguez D Antwian Santaana 03/24/2023, 11:37 AM

## 2023-03-25 ENCOUNTER — Encounter (HOSPITAL_COMMUNITY): Payer: Self-pay

## 2023-03-25 DIAGNOSIS — F333 Major depressive disorder, recurrent, severe with psychotic symptoms: Secondary | ICD-10-CM | POA: Diagnosis not present

## 2023-03-25 MED ORDER — HYDRALAZINE HCL 10 MG PO TABS
10.0000 mg | ORAL_TABLET | Freq: Two times a day (BID) | ORAL | Status: DC
  Filled 2023-03-25 (×2): qty 1

## 2023-03-25 MED ORDER — FLUOXETINE HCL 20 MG PO CAPS
40.0000 mg | ORAL_CAPSULE | Freq: Every day | ORAL | Status: DC
  Administered 2023-03-26 – 2023-03-29 (×4): 40 mg via ORAL
  Filled 2023-03-25 (×6): qty 2

## 2023-03-25 MED ORDER — HYDROCHLOROTHIAZIDE 12.5 MG PO TABS
12.5000 mg | ORAL_TABLET | Freq: Every day | ORAL | Status: DC
  Administered 2023-03-25 – 2023-03-30 (×6): 12.5 mg via ORAL
  Filled 2023-03-25 (×8): qty 1

## 2023-03-25 NOTE — Group Note (Signed)
Date:  03/25/2023 Time:  4:16 AM  Group Topic/Focus:  Wrap-Up Group:   The focus of this group is to help patients review their daily goal of treatment and discuss progress on daily workbooks.    Participation Level:  Active  Participation Quality:  Appropriate and Sharing  Affect:  Appropriate  Cognitive:  Appropriate  Insight: Appropriate  Engagement in Group:  Engaged  Modes of Intervention:  Activity and Socialization  Additional Comments:  Patient rated his day a 1/10. Patient shared that his day was "alright"/. Patient shared that he has been having issues with medication. Patient participated in ice breaker activity after sharing.   [name] 03/25/2023, 4:16 AM

## 2023-03-25 NOTE — Plan of Care (Signed)
Problem: Education: Goal: Knowledge of [facility] Education information/materials will improve Outcome: Progressing   Problem: Activity: Goal: Interest or engagement in activities will improve Outcome: Progressing   Problem: Coping: Goal: Ability to verbalize frustrations and anger appropriately will improve Outcome: Progressing

## 2023-03-25 NOTE — Progress Notes (Signed)
   03/24/23 2200  Psych Admission Type (Psych Patients Only)  Admission Status Voluntary  Psychosocial Assessment  Patient Complaints Anxiety  Eye Contact Fair  Facial Expression Animated  Affect Silly  Speech Slurred;Logical/coherent  Interaction Assertive  Motor Activity Slow  Appearance/Hygiene Unremarkable  Behavior Characteristics Cooperative;Appropriate to situation  Mood Pleasant  Thought Process  Coherency WDL  Content WDL  Delusions None reported or observed  Perception WDL  Hallucination None reported or observed  Judgment Poor  Confusion None  Danger to Self  Current suicidal ideation? Denies  Self-Injurious Behavior No self-injurious ideation or behavior indicators observed or expressed   Agreement Not to Harm Self Yes  Description of Agreement verbal  Danger to Others  Danger to Others None reported or observed

## 2023-03-25 NOTE — Progress Notes (Signed)
   03/25/23 0800  Psych Admission Type (Psych Patients Only)  Admission Status Voluntary  Psychosocial Assessment  Patient Complaints Anxiety  Eye Contact Fair  Facial Expression Animated  Affect Silly  Speech Slurred;Logical/coherent  Interaction Assertive  Motor Activity Slow  Appearance/Hygiene Unremarkable  Behavior Characteristics Cooperative;Appropriate to situation  Mood Pleasant  Thought Process  Coherency WDL  Content WDL  Delusions None reported or observed  Perception WDL  Hallucination None reported or observed  Judgment Poor  Confusion None  Danger to Self  Current suicidal ideation? Denies  Self-Injurious Behavior No self-injurious ideation or behavior indicators observed or expressed   Agreement Not to Harm Self Yes  Description of Agreement verbal  Danger to Others  Danger to Others None reported or observed

## 2023-03-25 NOTE — Progress Notes (Signed)
   03/25/23 2300  Psych Admission Type (Psych Patients Only)  Admission Status Voluntary  Psychosocial Assessment  Patient Complaints Anxiety  Eye Contact Fair  Facial Expression Animated  Affect Anxious  Speech Logical/coherent  Interaction Assertive  Motor Activity Slow  Appearance/Hygiene Unremarkable  Behavior Characteristics Appropriate to situation  Mood Pleasant  Thought Process  Coherency WDL  Content WDL  Delusions None reported or observed  Perception WDL  Hallucination None reported or observed  Judgment Poor  Confusion None  Danger to Self  Current suicidal ideation? Denies (Denies)  Agreement Not to Harm Self Yes  Description of Agreement verbal  Danger to Others  Danger to Others None reported or observed   Patient C/O panic attack and feelings of mild agitation and irritable. Prn haldol 5 mg and Benadryl 50 mg given at HS and effective. Patient in bed sleeping respirations noted. Q 15 minutes safety checks ongoing. Patient remains safe.

## 2023-03-25 NOTE — Plan of Care (Signed)
Problem: Activity: Goal: Interest or engagement in activities will improve Outcome: Progressing   Problem: Coping: Goal: Ability to verbalize frustrations and anger appropriately will improve Outcome: Progressing

## 2023-03-25 NOTE — [facility] Group Notes (Signed)
[facility] Group Notes:  (Nursing/MHT/Case Management/Adjunct)  Date:  03/25/2023  Time:  10:15 PM  Type of Therapy:  Psychoeducational Skills  Participation Level:  None  Participation Quality:  Attentive  Affect:  Flat  Cognitive:  Lacking  Insight:  Lacking  Engagement in Group:  Lacking  Modes of Intervention:  Education  Summary of Progress/Problems: The patient attended the evening group but did not share.   Hazle Coca S 03/25/2023, 10:15 PM

## 2023-03-25 NOTE — Progress Notes (Signed)
     03/25/2023       1:35 PM   [name]   Type of Note: Substance Use Treatment resources  Pt was given residential substance use treatment list with information and numbers to call to complete phone screens. Pt encouraged to make phone calls this afternoon/weekend. Pt was med window at time of conversation, information left on pt's bed. Pt aware.   Signed:  Taitum Menton, LCSW-A 03/25/2023  1:35 PM

## 2023-03-25 NOTE — BH IP Treatment Plan (Signed)
Interdisciplinary Treatment and Diagnostic Plan Update  03/25/2023 Time of Session: 10:35 AM [name] MRN: [id number]  Principal Diagnosis: MDD (major depressive disorder), recurrent, severe, with psychosis (HCC)  Secondary Diagnoses: Principal Problem:   MDD (major depressive disorder), recurrent, severe, with psychosis (HCC) Active Problems:   Stimulant use disorder   Alcohol use disorder   GAD (generalized anxiety disorder)   Current Medications:  Current Facility-Administered Medications  Medication Dose Route Frequency Provider Last Rate Last Admin   acetaminophen (TYLENOL) tablet 650 mg  650 mg Oral Q6H PRN Maryagnes Amos, [name]   650 mg at 03/24/23 1726   albuterol (VENTOLIN HFA) 108 (90 Base) MCG/ACT inhaler 2 puff  2 puff Inhalation Q4H PRN Starkes-Perry, Juel Burrow, [name]       alum & mag hydroxide-simeth (MAALOX/MYLANTA) 200-200-20 MG/5ML suspension 30 mL  30 mL Oral Q4H PRN Maryagnes Amos, [name]   30 mL at 03/24/23 2116   amLODipine (NORVASC) tablet 10 mg  10 mg Oral Daily Maryagnes Amos, [name]   10 mg at 03/25/23 0856   atorvastatin (LIPITOR) tablet 20 mg  20 mg Oral Daily Maryagnes Amos, [name]   20 mg at 03/25/23 6578   cloNIDine (CATAPRES) tablet 0.1 mg  0.1 mg Oral Q4H PRN Massengill, Harrold Donath, MD       haloperidol (HALDOL) tablet 5 mg  5 mg Oral TID PRN [name], MD       And   diphenhydrAMINE (BENADRYL) capsule 50 mg  50 mg Oral TID PRN Massengill, Harrold Donath, MD       haloperidol lactate (HALDOL) injection 5 mg  5 mg Intramuscular TID PRN Massengill, Harrold Donath, MD       And   diphenhydrAMINE (BENADRYL) injection 50 mg  50 mg Intramuscular TID PRN Massengill, Harrold Donath, MD       And   LORazepam (ATIVAN) injection 2 mg  2 mg Intramuscular TID PRN Massengill, Harrold Donath, MD       haloperidol lactate (HALDOL) injection 10 mg  10 mg Intramuscular TID PRN Massengill, Harrold Donath, MD       And   diphenhydrAMINE (BENADRYL) injection 50 mg  50 mg Intramuscular  TID PRN Massengill, Harrold Donath, MD       And   LORazepam (ATIVAN) injection 2 mg  2 mg Intramuscular TID PRN Massengill, Harrold Donath, MD       docusate sodium (COLACE) capsule 100 mg  100 mg Oral Daily Maryagnes Amos, [name]   100 mg at 03/25/23 0856   FLUoxetine (PROZAC) capsule 20 mg  20 mg Oral Daily Maryagnes Amos, [name]   20 mg at 03/25/23 0856   folic acid (FOLVITE) tablet 1 mg  1 mg Oral Daily Maryagnes Amos, [name]   1 mg at 03/25/23 0856   hydrOXYzine (ATARAX) tablet 25 mg  25 mg Oral Q8H PRN Abbott Pao, Nadir, MD   25 mg at 03/24/23 2051   LORazepam (ATIVAN) tablet 1-4 mg  1-4 mg Oral Q1H PRN Maryagnes Amos, [name]   1 mg at 03/24/23 1726   magnesium hydroxide (MILK OF MAGNESIA) suspension 30 mL  30 mL Oral Daily PRN Maryagnes Amos, [name]       melatonin tablet 3 mg  3 mg Oral QHS Massengill, Nathan, MD   3 mg at 03/24/23 2050   multivitamin with minerals tablet 1 tablet  1 tablet Oral Daily Maryagnes Amos, [name]   1 tablet at 03/25/23 0856   [START ON 03/26/2023] naltrexone (DEPADE) tablet 50  mg  50 mg Oral Daily Massengill, Nathan, MD       pantoprazole (PROTONIX) EC tablet 40 mg  40 mg Oral Daily Maryagnes Amos, [name]   40 mg at 03/25/23 0856   thiamine (Vitamin B-1) tablet 100 mg  100 mg Oral Daily Maryagnes Amos, [name]   100 mg at 03/25/23 1610   PTA Medications: Medications Prior to Admission  Medication Sig Dispense Refill Last Dose/Taking   albuterol (VENTOLIN HFA) 108 (90 Base) MCG/ACT inhaler Inhale 2 puffs into the lungs every 4 (four) hours as needed for wheezing or shortness of breath. 18 each 2    amLODipine (NORVASC) 10 MG tablet Take 1 tablet (10 mg total) by mouth daily for 14 days. 14 tablet 0    atorvastatin (LIPITOR) 20 MG tablet Take 1 tablet (20 mg total) by mouth daily for 14 days. 14 tablet 0    hydrOXYzine (VISTARIL) 25 MG capsule Take 1 capsule (25 mg total) by mouth every 8 (eight) hours as needed. 30 capsule 0    pantoprazole  (PROTONIX) 40 MG tablet Take 1 tablet (40 mg total) by mouth daily.      polyethylene glycol (MIRALAX / GLYCOLAX) 17 g packet Take 17 g by mouth 2 (two) times daily. (Patient not taking: Reported on 11/22/2022)      VENTOLIN HFA 108 (90 Base) MCG/ACT inhaler Inhale 2 puffs into the lungs every 6 (six) hours as needed for wheezing or shortness of breath. 6.7 g 0     Patient Stressors: Medication change or noncompliance   Substance abuse    Patient Strengths: Printmaker for treatment/growth  Special hobby/interest   Treatment Modalities: Medication Management, Group therapy, Case management,  1 to 1 session with clinician, Psychoeducation, Recreational therapy.   Physician Treatment Plan for Primary Diagnosis: MDD (major depressive disorder), recurrent, severe, with psychosis (HCC) Long Term Goal(s): Improvement in symptoms so as ready for discharge   Short Term Goals: Ability to identify changes in lifestyle to reduce recurrence of condition will improve Ability to verbalize feelings will improve Ability to disclose and discuss suicidal ideas Ability to demonstrate self-control will improve Ability to identify and develop effective coping behaviors will improve Ability to maintain clinical measurements within normal limits will improve Compliance with prescribed medications will improve Ability to identify triggers associated with substance abuse/mental health issues will improve  Medication Management: Evaluate patient's response, side effects, and tolerance of medication regimen.  Therapeutic Interventions: 1 to 1 sessions, Unit Group sessions and Medication administration.  Evaluation of Outcomes: Not Progressing  Physician Treatment Plan for Secondary Diagnosis: Principal Problem:   MDD (major depressive disorder), recurrent, severe, with psychosis (HCC) Active Problems:   Stimulant use disorder   Alcohol use disorder   GAD (generalized anxiety  disorder)  Long Term Goal(s): Improvement in symptoms so as ready for discharge   Short Term Goals: Ability to identify changes in lifestyle to reduce recurrence of condition will improve Ability to verbalize feelings will improve Ability to disclose and discuss suicidal ideas Ability to demonstrate self-control will improve Ability to identify and develop effective coping behaviors will improve Ability to maintain clinical measurements within normal limits will improve Compliance with prescribed medications will improve Ability to identify triggers associated with substance abuse/mental health issues will improve     Medication Management: Evaluate patient's response, side effects, and tolerance of medication regimen.  Therapeutic Interventions: 1 to 1 sessions, Unit Group sessions and Medication administration.  Evaluation of Outcomes: Not  Progressing   RN Treatment Plan for Primary Diagnosis: MDD (major depressive disorder), recurrent, severe, with psychosis (HCC) Long Term Goal(s): Knowledge of disease and therapeutic regimen to maintain health will improve  Short Term Goals: Ability to remain free from injury will improve, Ability to verbalize frustration and anger appropriately will improve, Ability to demonstrate self-control, Ability to participate in decision making will improve, Ability to verbalize feelings will improve, Ability to disclose and discuss suicidal ideas, Ability to identify and develop effective coping behaviors will improve, and Compliance with prescribed medications will improve  Medication Management: RN will administer medications as ordered by provider, will assess and evaluate patient's response and provide education to patient for prescribed medication. RN will report any adverse and/or side effects to prescribing provider.  Therapeutic Interventions: 1 on 1 counseling sessions, Psychoeducation, Medication administration, Evaluate responses to treatment,  Monitor vital signs and CBGs as ordered, Perform/monitor CIWA, COWS, AIMS and Fall Risk screenings as ordered, Perform wound care treatments as ordered.  Evaluation of Outcomes: Not Progressing   LCSW Treatment Plan for Primary Diagnosis: MDD (major depressive disorder), recurrent, severe, with psychosis (HCC) Long Term Goal(s): Safe transition to appropriate next level of care at discharge, Engage patient in therapeutic group addressing interpersonal concerns.  Short Term Goals: Engage patient in aftercare planning with referrals and resources, Increase social support, Increase ability to appropriately verbalize feelings, Increase emotional regulation, Facilitate acceptance of mental health diagnosis and concerns, Facilitate patient progression through stages of change regarding substance use diagnoses and concerns, Identify triggers associated with mental health/substance abuse issues, and Increase skills for wellness and recovery  Therapeutic Interventions: Assess for all discharge needs, 1 to 1 time with Social worker, Explore available resources and support systems, Assess for adequacy in community support network, Educate family and significant other(s) on suicide prevention, Complete Psychosocial Assessment, Interpersonal group therapy.  Evaluation of Outcomes: Not Progressing   Progress in Treatment: Attending groups: attended some groups Participating in groups: Yes. Taking medication as prescribed: Yes. Toleration medication: Yes. Family/Significant other contact made:  patient declined to provide consents Patient understands diagnosis: Yes. Discussing patient identified problems/goals with staff: Yes. Medical problems stabilized or resolved: Yes. Denies suicidal/homicidal ideation: Yes. Issues/concerns per patient self-inventory: No.  New problem(s) identified:  No  New Short Term/Long Term Goal(s):     medication stabilization, elimination of SI thoughts, development of  comprehensive mental wellness plan.   Patient Goals:  "long term residential treatment"  Discharge Plan or Barriers:  Patient recently admitted. CSW will continue to follow and assess for appropriate referrals and possible discharge planning.   Reason for Continuation of Hospitalization: Depression Medication stabilization Suicidal ideation  Estimated Length of Stay:  5 - 7 days  Last 3 [place] Suicide Severity Risk Score: Flowsheet Row [facility] (Current) [facility] from 03/22/2023 in [facility] Department at [facility] ED from 02/07/2023 in [facility] Emergency Department at [facility]  C-SSRS RISK CATEGORY High Risk High Risk No Risk       Last [facility] 2/9 Scores:    12/21/2022    8:09 AM 11/10/2022    2:17 PM 08/18/2022    8:25 AM  Depression screen PHQ 2/9  Decreased Interest 1 3 0  Down, Depressed, Hopeless 3 3 1   PHQ - 2 Score 4 6 1   Altered sleeping 3  1  Tired, decreased energy 3  1  Change in appetite 0  0  Feeling bad or  failure about yourself  3  0  Trouble concentrating 3  0  Moving slowly or fidgety/restless 1  0  Suicidal thoughts 3  0  PHQ-9 Score 20  3  Difficult doing work/chores Very difficult  Somewhat difficult    Scribe for Treatment Team: Alla Feeling, LCSWA 03/25/2023 12:15 PM

## 2023-03-25 NOTE — [facility] Counselor (Signed)
Adult Comprehensive Assessment  Patient ID: [name], male   DOB: 19-Jul-1965, 58 y.o.   MRN: [id number]  Information Source: Information source: Patient  Current Stressors:  Patient states their primary concerns and needs for treatment are:: Needs to get connected to long term drug treament facility Patient states their goals for this hospitilization and ongoing recovery are:: Medication and detox Substance abuse: Looking for long term treatment  Living/Environment/Situation:  Living Arrangements:  (Unknown) Living conditions (as described by patient or guardian): Patient was guarded and did not respond to many questions the CSW asked.  Family History:  Marital status: Divorced Divorced, when?: Married 20 years, divorced 25. Does patient have children?: Yes (Daughter (did not give consent to contact)) How many children?: 1  Childhood History:  Additional childhood history information: Patient did not respond to question, he became aggitated and walked away.  Education: Unknown/ did not disclose   Employment/Work Situation:   Employment Situation: On disability Why is Patient on Disability: Did not disclose-Patient was guarded Has Patient ever Been in the [employer]?: No  Financial Resources:   Surveyor, quantity resources: [employer], [employer], Medicaid Does patient have a Clifford or guardian?: No  Alcohol/Substance Abuse:   What has been your use of drugs/alcohol within the last 12 months?: Stimulants, alcohol If attempted suicide, did drugs/alcohol play a role in this?: Yes Alcohol/Substance Abuse Treatment Hx: Past Tx, Inpatient If yes, describe treatment: 2024 [facility], [place] only for 3 days in 2024 post d/c from [facility] Has alcohol/substance abuse ever caused legal problems?: No  Social Support System:   Forensic psychologist System: None Describe Community Support System: Daughter is listed as a contact, patient did not give consent to  contact.  Strengths/Needs:   Other important information patient would like considered in planning for their treatment: Looking for residential substance use facility  Discharge Plan:   Currently receiving community mental health services: No Patient states concerns and preferences for aftercare planning are: Looking for residential substance use facility Patient states they will know when they are safe and ready for discharge when: Patient walked away during interview. Does patient have access to transportation?: No Does patient have financial barriers related to discharge medications?: No Will patient be returning to same living situation after discharge?: No  Summary/Recommendations:   Summary and Recommendations (to be completed by the evaluator): Patient is a 58 year old male with a psychiatric history of MDD and GAD in addition to alcohol use disorder and stimulant use disorder, who was admitted to the psychiatric hospital [facility], for evaluation of suicidal thoughts and also suicide attempt by cutting himself superficially with a kitchen knife. Patient will benefit from crisis stabilization, medication evaluation, group therapy and psychoeducation, in addition to case management for discharge planning. At discharge it is recommended that Patient adhere to the established discharge plan and continue in treatment.  Patient walked away during the intake interview. Appeared agitated, went to get medication.  [name] L [name]. LCSWA  03/25/2023
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
## 2023-03-26 MED ORDER — GABAPENTIN 100 MG PO CAPS
100.0000 mg | ORAL_CAPSULE | Freq: Three times a day (TID) | ORAL | Status: DC
  Administered 2023-03-26 – 2023-03-31 (×16): 100 mg via ORAL
  Filled 2023-03-26 (×23): qty 1

## 2023-03-26 NOTE — [facility] Group Notes (Signed)
Pt did not attend goals/orientation group.  

## 2023-03-26 NOTE — Group Note (Signed)
[facility] LCSW Group Therapy Note    Group Date: 03/26/2023 Start Time: 1000 End Time: 1120  Type of Therapy and Topic:  Group Therapy:  Change/ Overcoming Obstacles  Participation Level:  [facility] PARTICIPATION LEVEL: Did Not Attend  Mood:  Description of Group:   In this group patients will be encouraged to explore what they see as obstacles to their own wellness and recovery. They will be guided to discuss their thoughts, feelings, and behaviors related to these obstacles. The group will process together ways to cope with barriers, with attention given to specific choices patients can make. Each patient will be challenged to identify changes they are motivated to make in order to overcome their obstacles. This group will be process-oriented, with patients participating in exploration of their own experiences as well as giving and receiving support and challenge from other group members.  Therapeutic Goals: 1. Patient will identify personal and current obstacles as they relate to admission. 2. Patient will identify barriers that currently interfere with their wellness or overcoming obstacles.  3. Patient will identify feelings, thought process and behaviors related to these barriers. 4. Patient will identify two changes they are willing to make to overcome these obstacles:    Summary of Patient Progress   Pt was invited, but did not attend.    Therapeutic Modalities:   Cognitive Behavioral Therapy Solution Focused Therapy Motivational Interviewing Relapse Prevention Therapy   Steffanie Dunn, LCSW

## 2023-03-26 NOTE — Progress Notes (Signed)
   03/26/23 1000  Psych Admission Type (Psych Patients Only)  Admission Status Voluntary  Psychosocial Assessment  Patient Complaints Anxiety  Eye Contact Fair  Facial Expression Sad  Affect Depressed  Speech Logical/coherent  Interaction Assertive  Motor Activity Slow  Appearance/Hygiene Unremarkable  Behavior Characteristics Cooperative;Appropriate to situation  Mood Anxious  Thought Process  Coherency WDL  Content WDL  Delusions None reported or observed  Perception WDL  Hallucination None reported or observed  Judgment Poor  Confusion None  Danger to Self  Current suicidal ideation? Denies  Danger to Others  Danger to Others None reported or observed

## 2023-03-26 NOTE — Progress Notes (Signed)
   03/26/23 2300  Psych Admission Type (Psych Patients Only)  Admission Status Voluntary  Psychosocial Assessment  Patient Complaints Anxiety  Eye Contact Fair  Facial Expression Sad  Affect Depressed  Speech Logical/coherent  Interaction Assertive  Motor Activity Slow  Appearance/Hygiene Unremarkable  Behavior Characteristics Cooperative;Appropriate to situation  Mood Anxious  Thought Process  Coherency WDL  Content WDL  Delusions None reported or observed  Perception WDL  Hallucination None reported or observed  Judgment Poor  Confusion None  Danger to Self  Current suicidal ideation? Denies  Agreement Not to Harm Self Yes  Description of Agreement verbal  Danger to Others  Danger to Others None reported or observed
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
## 2023-03-26 NOTE — Group Note (Signed)
Date:  03/26/2023 Time:  4:36 PM  Group Topic/Focus:  Developing a Wellness Toolbox:   The focus of this group is to help patients develop a "wellness toolbox" with skills and strategies to promote recovery upon discharge.    Participation Level:  Did Not Attend  [name] 03/26/2023, 4:36 PM

## 2023-03-26 NOTE — Plan of Care (Signed)
   Problem: Education: Goal: Emotional status will improve Outcome: Progressing Goal: Mental status will improve Outcome: Progressing Goal: Verbalization of understanding the information provided will improve Outcome: Progressing

## 2023-03-26 NOTE — Group Note (Signed)
Date:  03/26/2023 Time:  9:25 PM  Group Topic/Focus:  Wrap-Up Group:   The focus of this group is to help patients review their daily goal of treatment and discuss progress on daily workbooks.    Participation Level:  Active  Participation Quality:  Appropriate and Sharing  Affect:  Appropriate  Cognitive:  Appropriate  Insight: Appropriate  Engagement in Group:  Engaged  Modes of Intervention:  Activity and Socialization  Additional Comments:  Patients used wrap up group sheets. Patient rated his day a 5/10. Patient shared his goal for today, "to get sleep" and patient did not achieve his goal. Patient shared coping skills that he finds most helpful, "taking to people". Patient participated it activity after sharing.   [name] 03/26/2023, 9:25 PM

## 2023-03-26 NOTE — Plan of Care (Signed)
   Problem: Education: Goal: Emotional status will improve Outcome: Progressing Goal: Mental status will improve Outcome: Progressing

## 2023-03-27 MED ORDER — IBUPROFEN 600 MG PO TABS
600.0000 mg | ORAL_TABLET | Freq: Three times a day (TID) | ORAL | Status: DC
  Administered 2023-03-27 – 2023-03-31 (×12): 600 mg via ORAL
  Filled 2023-03-27 (×18): qty 1

## 2023-03-27 NOTE — [facility] Group Notes (Signed)
Adult Psychoeducational Group Note  Date:  03/27/2023 Time:  11:56 PM  Group Topic/Focus:  Wrap-Up Group:   The focus of this group is to help patients review their daily goal of treatment and discuss progress on daily workbooks.  Participation Level:  Active  Participation Quality:  Appropriate  Affect:  Appropriate  Cognitive:  Appropriate  Insight: Appropriate  Engagement in Group:  Engaged  Modes of Intervention:  Discussion  Additional Comments:  Pt attended group.  Joselyn Arrow 03/27/2023, 11:56 PM

## 2023-03-27 NOTE — Progress Notes (Signed)
   03/27/23 1200  Psych Admission Type (Psych Patients Only)  Admission Status Voluntary  Psychosocial Assessment  Patient Complaints Anxiety;Depression  Eye Contact Fair  Facial Expression Sad  Affect Depressed  Speech Logical/coherent  Interaction Assertive  Motor Activity Slow  Appearance/Hygiene Unremarkable  Behavior Characteristics Cooperative;Appropriate to situation  Mood Anxious;Pleasant  Thought Process  Coherency WDL  Content WDL  Delusions None reported or observed  Perception WDL  Hallucination None reported or observed  Judgment Poor  Confusion None  Danger to Self  Current suicidal ideation? Denies  Danger to Others  Danger to Others None reported or observed

## 2023-03-27 NOTE — [facility] Group Notes (Signed)
Pt did not attend goals group. 
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
## 2023-03-27 NOTE — [facility] Group Notes (Signed)
Pt did not participate in group activity

## 2023-03-27 NOTE — Plan of Care (Signed)
 ?  Problem: Education: ?Goal: Mental status will improve ?Outcome: Progressing ?Goal: Verbalization of understanding the information provided will improve ?Outcome: Progressing ?  ?

## 2023-03-28 DIAGNOSIS — F333 Major depressive disorder, recurrent, severe with psychotic symptoms: Secondary | ICD-10-CM | POA: Diagnosis not present

## 2023-03-28 MED ORDER — METHOCARBAMOL 750 MG PO TABS
750.0000 mg | ORAL_TABLET | Freq: Three times a day (TID) | ORAL | Status: DC | PRN
  Administered 2023-03-28 – 2023-03-29 (×2): 750 mg via ORAL
  Filled 2023-03-28 (×2): qty 1

## 2023-03-28 MED ORDER — METHOCARBAMOL 500 MG PO TABS
500.0000 mg | ORAL_TABLET | Freq: Three times a day (TID) | ORAL | Status: DC | PRN
  Administered 2023-03-28: 500 mg via ORAL
  Filled 2023-03-28: qty 1

## 2023-03-28 MED ORDER — NAPHAZOLINE-GLYCERIN 0.012-0.25 % OP SOLN
1.0000 [drp] | Freq: Four times a day (QID) | OPHTHALMIC | Status: DC | PRN
  Administered 2023-03-28 – 2023-03-29 (×4): 2 [drp] via OPHTHALMIC
  Filled 2023-03-28: qty 15

## 2023-03-28 NOTE — Plan of Care (Signed)
  Problem: Education: Goal: Emotional status will improve Outcome: Progressing Goal: Verbalization of understanding the information provided will improve Outcome: Progressing   Problem: Coping: Goal: Ability to verbalize frustrations and anger appropriately will improve Outcome: Progressing Goal: Ability to demonstrate self-control will improve Outcome: Progressing   Problem: Health Behavior/Discharge Planning: Goal: Compliance with treatment plan for underlying cause of condition will improve Outcome: Progressing   Problem: Physical Regulation: Goal: Ability to maintain clinical measurements within normal limits will improve Outcome: Progressing   Problem: Safety: Goal: Periods of time without injury will increase Outcome: Progressing

## 2023-03-28 NOTE — Group Note (Signed)
Date:  03/28/2023 Time:  4:22 PM  Group Topic/Focus:  Goals Group:   The focus of this group is to help patients establish daily goals to achieve during treatment and discuss how the patient can incorporate goal setting into their daily lives to aide in recovery. Orientation:   The focus of this group is to educate the patient on the purpose and policies of crisis stabilization and provide a format to answer questions about their admission.  The group details unit policies and expectations of patients while admitted.    Participation Level:  Minimal  Participation Quality:  Attentive  Affect:  Appropriate  Cognitive:  Oriented  Insight: Lacking  Engagement in Group:  Lacking  Modes of Intervention:  Discussion, Orientation, and Rapport Building  Additional Comments:   Pt attended the Orientation and Goals group. Pt was calm as he expressed his frustration with not finding inpatient aftercare. Pt receptive to the encouragement and support provided. Pt agreed to follow up with his assigned LCSW. Pt personal goal for today is to speak with his social worker about inpatient treatment.  [name] 03/28/2023, 4:22 PM

## 2023-03-28 NOTE — Progress Notes (Signed)
   03/28/23 2000  Psych Admission Type (Psych Patients Only)  Admission Status Voluntary  Psychosocial Assessment  Patient Complaints Anxiety;Depression;Irritability  Eye Contact Fair  Facial Expression Sad  Affect Depressed  Speech Logical/coherent  Interaction Assertive  Motor Activity Slow  Appearance/Hygiene Unremarkable  Behavior Characteristics Cooperative;Appropriate to situation  Mood Depressed;Anxious  Thought Process  Coherency WDL  Content WDL  Delusions None reported or observed  Perception WDL  Hallucination None reported or observed  Judgment Poor  Confusion None  Danger to Self  Current suicidal ideation? Denies  Agreement Not to Harm Self Yes  Description of Agreement verbal  Danger to Others  Danger to Others None reported or observed
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
## 2023-03-28 NOTE — Group Note (Signed)
Date:  03/28/2023 Time:  9:22 PM  Group Topic/Focus:  Alcoholics Anonymous (AA) Meeting    Participation Level:  Active  Participation Quality:  Appropriate  Affect:  Appropriate  Cognitive:  Appropriate  Insight: Appropriate  Engagement in Group:  Engaged  Modes of Intervention:  Socialization and Support  Additional Comments:  Patient attended AA Meeting   [name] 03/28/2023, 9:22 PM

## 2023-03-28 NOTE — Progress Notes (Signed)
   03/28/23 1000  Psych Admission Type (Psych Patients Only)  Admission Status Voluntary  Psychosocial Assessment  Patient Complaints Anxiety;Depression  Eye Contact Fair  Facial Expression Sad  Affect Depressed  Speech Logical/coherent  Interaction Assertive  Motor Activity Slow  Appearance/Hygiene Unremarkable  Behavior Characteristics Cooperative;Appropriate to situation  Mood Depressed;Pleasant;Anxious  Thought Process  Coherency WDL  Content WDL  Delusions None reported or observed  Perception WDL  Hallucination None reported or observed  Judgment Poor  Confusion None  Danger to Self  Current suicidal ideation? Denies  Agreement Not to Harm Self Yes  Description of Agreement verbal  Danger to Others  Danger to Others None reported or observed

## 2023-03-29 DIAGNOSIS — F333 Major depressive disorder, recurrent, severe with psychotic symptoms: Secondary | ICD-10-CM | POA: Diagnosis not present

## 2023-03-29 MED ORDER — FLUOXETINE HCL 20 MG PO CAPS
60.0000 mg | ORAL_CAPSULE | Freq: Every day | ORAL | Status: DC
  Administered 2023-03-30 – 2023-03-31 (×2): 60 mg via ORAL
  Filled 2023-03-29 (×4): qty 3

## 2023-03-29 NOTE — Progress Notes (Signed)
   03/28/23 2000  Psych Admission Type (Psych Patients Only)  Admission Status Voluntary  Psychosocial Assessment  Patient Complaints Anxiety;Depression;Irritability  Eye Contact Fair  Facial Expression Sad  Affect Depressed  Speech Logical/coherent  Interaction Assertive  Motor Activity Slow  Appearance/Hygiene Unremarkable  Behavior Characteristics Cooperative;Appropriate to situation  Mood Depressed;Anxious  Thought Process  Coherency WDL  Content WDL  Delusions None reported or observed  Perception WDL  Hallucination None reported or observed  Judgment Poor  Confusion None  Danger to Self  Current suicidal ideation? Denies  Agreement Not to Harm Self Yes  Description of Agreement verbal  Danger to Others  Danger to Others None reported or observed

## 2023-03-29 NOTE — [facility] Group Notes (Signed)

## 2023-03-29 NOTE — Progress Notes (Signed)
Patient ID: [name], male   DOB: 28-Nov-1965, 58 y.o.   MRN: [id number] Spoke with Pt on the unit and reports prospective treatment option of [facility]. He provided phone number for a [name] and [name] - [phone].  CSW reached out to [name] who passed along a message to [name]. Per [name] in group and will call back when able. He denies any needs from social work team at this time, as it relates to sending clinicals or additional information.   CSW team will continue to follow.   1500 CSW spoke with [name] at [facility]. She confirmed that she has completed an assessment with Pt. She requested that provider complete a physician attestation, which would allow Pt to be placed on waiting list.   [name],03/29/23 2:40 PM

## 2023-03-29 NOTE — Plan of Care (Signed)
   Problem: Education: Goal: Knowledge of [facility] Education information/materials will improve Outcome: Progressing Goal: Emotional status will improve Outcome: Progressing Goal: Mental status will improve Outcome: Progressing Goal: Verbalization of understanding the information provided will improve Outcome: Progressing
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
## 2023-03-29 NOTE — Progress Notes (Signed)
   03/29/23 0800  Psych Admission Type (Psych Patients Only)  Admission Status Voluntary  Psychosocial Assessment  Patient Complaints Anxiety;Depression  Eye Contact Fair  Facial Expression Sad  Affect Depressed  Speech Logical/coherent  Interaction Assertive  Motor Activity Slow  Appearance/Hygiene Unremarkable  Behavior Characteristics Cooperative;Appropriate to situation  Mood Depressed;Anxious  Thought Process  Coherency WDL  Content WDL  Delusions None reported or observed  Perception WDL  Hallucination None reported or observed  Judgment Poor  Confusion None  Danger to Self  Current suicidal ideation? Denies  Agreement Not to Harm Self Yes  Description of Agreement Verbal  Danger to Others  Danger to Others None reported or observed

## 2023-03-29 NOTE — Plan of Care (Signed)
   Problem: Education: Goal: Emotional status will improve Outcome: Progressing Goal: Mental status will improve Outcome: Progressing   Problem: Activity: Goal: Interest or engagement in activities will improve Outcome: Progressing

## 2023-03-29 NOTE — [facility] Group Notes (Signed)
[facility] Group Notes:  (Nursing/MHT/Case Management/Adjunct)  Date:  03/29/2023  Time:  2000  Type of Therapy:   Wrap up group  Participation Level:  Active  Participation Quality:  Appropriate, Attentive, Sharing, and Supportive  Affect:  Appropriate  Cognitive:  Alert and Appropriate  Insight:  Appropriate and Good  Engagement in Group:  Engaged and Supportive  Modes of Intervention:  Discussion and Support  Summary of Progress/Problems:  [name] 03/29/2023, 11:37 PM

## 2023-03-29 NOTE — Group Note (Signed)
LCSW Group Therapy Note   Group Date: 03/29/2023 Start Time: 1100 End Time: 1200  Participation:  did not attend  Type of Therapy:  Group Therapy  Title: "Healing Flames: Navigating Anger with Compassion"  Objective:  Foster self-awareness and promote compassion toward oneself and others when dealing with anger.  Goals:  Help participants understand the underlying emotions and needs fueling anger. Provide coping strategies for healthier emotional expression and anger management.  Summary: This session explored anger as a volcano--an explosion driven by deeper feelings and unmet needs. Participants learned to identify anger triggers and underlying emotions, then practiced coping strategies like deep breathing, physical activity, and journaling. The group discussed healthy ways to manage anger before it escalates, using both personal reflection and shared experiences.  Therapeutic Modalities: Cognitive Behavioral Therapy (CBT): Challenging thoughts that fuel anger. Mindfulness: Increasing awareness of emotions and sensations.   Alla Feeling, LCSWA 03/29/2023  7:27 PM

## 2023-03-30 ENCOUNTER — Encounter (HOSPITAL_COMMUNITY): Payer: Self-pay

## 2023-03-30 DIAGNOSIS — F333 Major depressive disorder, recurrent, severe with psychotic symptoms: Secondary | ICD-10-CM | POA: Diagnosis not present

## 2023-03-30 LAB — BASIC METABOLIC PANEL
Anion gap: 10 (ref 5–15)
BUN: 20 mg/dL (ref 6–20)
CO2: 25 mmol/L (ref 22–32)
Calcium: 9.5 mg/dL (ref 8.9–10.3)
Chloride: 99 mmol/L (ref 98–111)
Creatinine, Ser: 1 mg/dL (ref 0.61–1.24)
GFR, Estimated: >60 mL/min (ref >60)
Glucose, Bld: 160 mg/dL — ABNORMAL HIGH (ref 70–99)
Potassium: 4.1 mmol/L (ref 3.5–5.1)
Sodium: 134 mmol/L — ABNORMAL LOW (ref 135–145)

## 2023-03-30 MED ORDER — OXYCODONE-ACETAMINOPHEN 5-325 MG PO TABS: 1.0000 | ORAL_TABLET | ORAL | Status: AC

## 2023-03-30 MED ORDER — HYDROCHLOROTHIAZIDE 50 MG PO TABS
50.0000 mg | ORAL_TABLET | ORAL | Status: AC
  Administered 2023-03-30: 50 mg via ORAL
  Filled 2023-03-30: qty 1
  Filled 2023-03-30: qty 2

## 2023-03-30 MED ORDER — HYDROCHLOROTHIAZIDE 25 MG PO TABS
25.0000 mg | ORAL_TABLET | Freq: Two times a day (BID) | ORAL | Status: DC
  Administered 2023-03-30 – 2023-03-31 (×2): 25 mg via ORAL
  Filled 2023-03-30 (×6): qty 1

## 2023-03-30 NOTE — BH IP Treatment Plan (Signed)
Interdisciplinary Treatment and Diagnostic Plan Update  03/30/2023 Time of Session: 1130am-update [name] MRN: [id number]  Principal Diagnosis: MDD (major depressive disorder), recurrent, severe, with psychosis (HCC)  Secondary Diagnoses: Principal Problem:   MDD (major depressive disorder), recurrent, severe, with psychosis (HCC) Active Problems:   Stimulant use disorder   Alcohol use disorder   GAD (generalized anxiety disorder)   Current Medications:  Current Facility-Administered Medications  Medication Dose Route Frequency Provider Last Rate Last Admin   acetaminophen (TYLENOL) tablet 650 mg  650 mg Oral Q6H PRN Maryagnes Amos, [name]   650 mg at 03/29/23 2131   albuterol (VENTOLIN HFA) 108 (90 Base) MCG/ACT inhaler 2 puff  2 puff Inhalation Q4H PRN Starkes-Perry, Juel Burrow, [name]       alum & mag hydroxide-simeth (MAALOX/MYLANTA) 200-200-20 MG/5ML suspension 30 mL  30 mL Oral Q4H PRN Maryagnes Amos, [name]   30 mL at 03/24/23 2116   atorvastatin (LIPITOR) tablet 20 mg  20 mg Oral Daily Maryagnes Amos, [name]   20 mg at 03/30/23 7829   cloNIDine (CATAPRES) tablet 0.1 mg  0.1 mg Oral Q4H PRN [name], MD   0.1 mg at 03/28/23 5621   haloperidol (HALDOL) tablet 5 mg  5 mg Oral TID PRN [name], MD   5 mg at 03/25/23 2104   And   diphenhydrAMINE (BENADRYL) capsule 50 mg  50 mg Oral TID PRN [name], MD   50 mg at 03/25/23 2104   haloperidol lactate (HALDOL) injection 5 mg  5 mg Intramuscular TID PRN [name], MD       And   diphenhydrAMINE (BENADRYL) injection 50 mg  50 mg Intramuscular TID PRN Massengill, Harrold Donath, MD       And   LORazepam (ATIVAN) injection 2 mg  2 mg Intramuscular TID PRN Massengill, Harrold Donath, MD       haloperidol lactate (HALDOL) injection 10 mg  10 mg Intramuscular TID PRN Massengill, Harrold Donath, MD       And   diphenhydrAMINE (BENADRYL) injection 50 mg  50 mg Intramuscular TID PRN Massengill, Harrold Donath, MD        And   LORazepam (ATIVAN) injection 2 mg  2 mg Intramuscular TID PRN Massengill, Harrold Donath, MD       docusate sodium (COLACE) capsule 100 mg  100 mg Oral Daily Maryagnes Amos, [name]   100 mg at 03/30/23 0753   FLUoxetine (PROZAC) capsule 60 mg  60 mg Oral Daily Ntuen, Tina C, [name]   60 mg at 03/30/23 0753   folic acid (FOLVITE) tablet 1 mg  1 mg Oral Daily Maryagnes Amos, [name]   1 mg at 03/30/23 0753   gabapentin (NEURONTIN) capsule 100 mg  100 mg Oral TID Eliseo Gum B, MD   100 mg at 03/30/23 1351   hydrochlorothiazide (HYDRODIURIL) tablet 25 mg  25 mg Oral BID Ntuen, Jesusita Oka, [name]       hydrOXYzine (ATARAX) tablet 25 mg  25 mg Oral Q8H PRN Abbott Pao, Nadir, MD   25 mg at 03/30/23 1351   ibuprofen (ADVIL) tablet 600 mg  600 mg Oral TID Eliseo Gum B, MD   600 mg at 03/30/23 1249   magnesium hydroxide (MILK OF MAGNESIA) suspension 30 mL  30 mL Oral Daily PRN Maryagnes Amos, [name]       melatonin tablet 3 mg  3 mg Oral QHS Massengill, Nathan, MD   3 mg at 03/29/23 2130   methocarbamol (ROBAXIN) tablet 750  mg  750 mg Oral Q8H PRN [name], MD   750 mg at 03/29/23 0865   multivitamin with minerals tablet 1 tablet  1 tablet Oral Daily Maryagnes Amos, [name]   1 tablet at 03/30/23 0755   naltrexone (DEPADE) tablet 50 mg  50 mg Oral Daily Massengill, Harrold Donath, MD   50 mg at 03/30/23 7846   naphazoline-glycerin (CLEAR EYES REDNESS) ophth solution 1-2 drop  1-2 drop Both Eyes QID PRN Onuoha, Chinwendu V, NP   2 drop at 03/29/23 2129   oxyCODONE-acetaminophen (PERCOCET/ROXICET) 5-325 MG per tablet 1 tablet  1 tablet Oral NOW Ntuen, Jesusita Oka, [name]       pantoprazole (PROTONIX) EC tablet 40 mg  40 mg Oral Daily Maryagnes Amos, [name]   40 mg at 03/30/23 9629   thiamine (Vitamin B-1) tablet 100 mg  100 mg Oral Daily Maryagnes Amos, [name]   100 mg at 03/30/23 5284   PTA Medications: Medications Prior to Admission  Medication Sig Dispense Refill Last Dose/Taking   albuterol  (VENTOLIN HFA) 108 (90 Base) MCG/ACT inhaler Inhale 2 puffs into the lungs every 4 (four) hours as needed for wheezing or shortness of breath. 18 each 2    amLODipine (NORVASC) 10 MG tablet Take 1 tablet (10 mg total) by mouth daily for 14 days. 14 tablet 0    atorvastatin (LIPITOR) 20 MG tablet Take 1 tablet (20 mg total) by mouth daily for 14 days. 14 tablet 0    hydrOXYzine (VISTARIL) 25 MG capsule Take 1 capsule (25 mg total) by mouth every 8 (eight) hours as needed. 30 capsule 0    pantoprazole (PROTONIX) 40 MG tablet Take 1 tablet (40 mg total) by mouth daily.      polyethylene glycol (MIRALAX / GLYCOLAX) 17 g packet Take 17 g by mouth 2 (two) times daily. (Patient not taking: Reported on 11/22/2022)      VENTOLIN HFA 108 (90 Base) MCG/ACT inhaler Inhale 2 puffs into the lungs every 6 (six) hours as needed for wheezing or shortness of breath. 6.7 g 0     Patient Stressors: Medication change or noncompliance   Substance abuse    Patient Strengths: Printmaker for treatment/growth  Special hobby/interest   Treatment Modalities: Medication Management, Group therapy, Case management,  1 to 1 session with clinician, Psychoeducation, Recreational therapy.   Physician Treatment Plan for Primary Diagnosis: MDD (major depressive disorder), recurrent, severe, with psychosis (HCC) Long Term Goal(s): Improvement in symptoms so as ready for discharge   Short Term Goals: Ability to identify changes in lifestyle to reduce recurrence of condition will improve Ability to verbalize feelings will improve Ability to disclose and discuss suicidal ideas Ability to demonstrate self-control will improve Ability to identify and develop effective coping behaviors will improve Ability to maintain clinical measurements within normal limits will improve Compliance with prescribed medications will improve Ability to identify triggers associated with substance abuse/mental health issues  will improve  Medication Management: Evaluate patient's response, side effects, and tolerance of medication regimen.  Therapeutic Interventions: 1 to 1 sessions, Unit Group sessions and Medication administration.  Evaluation of Outcomes: Progressing  Physician Treatment Plan for Secondary Diagnosis: Principal Problem:   MDD (major depressive disorder), recurrent, severe, with psychosis (HCC) Active Problems:   Stimulant use disorder   Alcohol use disorder   GAD (generalized anxiety disorder)  Long Term Goal(s): Improvement in symptoms so as ready for discharge   Short Term Goals: Ability to identify changes  in lifestyle to reduce recurrence of condition will improve Ability to verbalize feelings will improve Ability to disclose and discuss suicidal ideas Ability to demonstrate self-control will improve Ability to identify and develop effective coping behaviors will improve Ability to maintain clinical measurements within normal limits will improve Compliance with prescribed medications will improve Ability to identify triggers associated with substance abuse/mental health issues will improve     Medication Management: Evaluate patient's response, side effects, and tolerance of medication regimen.  Therapeutic Interventions: 1 to 1 sessions, Unit Group sessions and Medication administration.  Evaluation of Outcomes: Progressing   RN Treatment Plan for Primary Diagnosis: MDD (major depressive disorder), recurrent, severe, with psychosis (HCC) Long Term Goal(s): Knowledge of disease and therapeutic regimen to maintain health will improve  Short Term Goals: Ability to remain free from injury will improve, Ability to participate in decision making will improve, Ability to verbalize feelings will improve, Ability to identify and develop effective coping behaviors will improve, and Compliance with prescribed medications will improve  Medication Management: RN will administer  medications as ordered by provider, will assess and evaluate patient's response and provide education to patient for prescribed medication. RN will report any adverse and/or side effects to prescribing provider.  Therapeutic Interventions: 1 on 1 counseling sessions, Psychoeducation, Medication administration, Evaluate responses to treatment, Monitor vital signs and CBGs as ordered, Perform/monitor CIWA, COWS, AIMS and Fall Risk screenings as ordered, Perform wound care treatments as ordered.  Evaluation of Outcomes: Progressing   LCSW Treatment Plan for Primary Diagnosis: MDD (major depressive disorder), recurrent, severe, with psychosis (HCC) Long Term Goal(s): Safe transition to appropriate next level of care at discharge, Engage patient in therapeutic group addressing interpersonal concerns.  Short Term Goals: Engage patient in aftercare planning with referrals and resources, Increase social support, Increase emotional regulation, Facilitate acceptance of mental health diagnosis and concerns, and Identify triggers associated with mental health/substance abuse issues  Therapeutic Interventions: Assess for all discharge needs, 1 to 1 time with Social worker, Explore available resources and support systems, Assess for adequacy in community support network, Educate family and significant other(s) on suicide prevention, Complete Psychosocial Assessment, Interpersonal group therapy.  Evaluation of Outcomes: Progressing   Progress in Treatment: Attending groups: attending some groups Participating in groups: Yes. Taking medication as prescribed: Yes. Toleration medication: Yes. Family/Significant other contact made:  patient declined to provide consents Patient understands diagnosis: Yes. Discussing patient identified problems/goals with staff: Yes. Medical problems stabilized or resolved: Yes. Denies suicidal/homicidal ideation: Yes. Issues/concerns per patient self-inventory: No.   New  problem(s) identified:  No   New Short Term/Long Term Goal(s):      medication stabilization, elimination of SI thoughts, development of comprehensive mental wellness plan.    Patient Goals:  "long term residential treatment"   Discharge Plan or Barriers:  Patient recently admitted. CSW will continue to follow and assess for appropriate referrals and possible discharge planning.    Reason for Continuation of Hospitalization: Depression Medication stabilization Suicidal ideation   Estimated Length of Stay:  2-3 days  Last 3 [place] Suicide Severity Risk Score: Flowsheet Row Ad[facility] (Current) [facility] from 03/22/2023 in [facility] at [facility] ED from 02/07/2023 in [facility] at [facility]  C-SSRS RISK CATEGORY High Risk High Risk No Risk       Last [facility] 2/9 Scores:    12/21/2022    8:09 AM 11/10/2022    2:17  PM 08/18/2022    8:25 AM  Depression screen PHQ 2/9  Decreased Interest 1 3 0  Down, Depressed, Hopeless 3 3 1   PHQ - 2 Score 4 6 1   Altered sleeping 3  1  Tired, decreased energy 3  1  Change in appetite 0  0  Feeling bad or failure about yourself  3  0  Trouble concentrating 3  0  Moving slowly or fidgety/restless 1  0  Suicidal thoughts 3  0  PHQ-9 Score 20  3  Difficult doing work/chores Very difficult  Somewhat difficult    Scribe for Treatment Team: Esmeralda Arthur 03/30/2023 2:36 PM

## 2023-03-30 NOTE — Progress Notes (Signed)
   03/30/23 0730  Psych Admission Type (Psych Patients Only)  Admission Status Voluntary  Psychosocial Assessment  Patient Complaints Anxiety;Depression  Eye Contact Fair  Facial Expression Sad  Affect Anxious  Speech Logical/coherent  Interaction Assertive  Motor Activity Slow  Appearance/Hygiene Unremarkable  Behavior Characteristics Cooperative;Appropriate to situation  Mood Anxious  Thought Process  Coherency WDL  Content WDL  Delusions None reported or observed  Perception WDL  Hallucination None reported or observed  Judgment Poor  Confusion None  Danger to Self  Current suicidal ideation? Denies  Agreement Not to Harm Self Yes  Description of Agreement verbal  Danger to Others  Danger to Others None reported or observed

## 2023-03-30 NOTE — Plan of Care (Signed)

## 2023-03-30 NOTE — [facility] Suicide Risk Assessment (Signed)
[facility] INPATIENT:  Family/Significant Other Suicide Prevention Education  Suicide Prevention Education:  Patient Refusal for Family/Significant Other Suicide Prevention Education: The patient [name] has refused to provide written consent for family/significant other to be provided Family/Significant Other Suicide Prevention Education during admission and/or prior to discharge.  Physician notified.  Joelyn Oms [place], LCSW 03/30/2023, 4:15 PM
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
## 2023-03-30 NOTE — [facility] Group Notes (Signed)
Spiritual care group facilitated by Chaplain Dyanne Carrel, [facility]  Group focused on topic of strength. Group members reflected on what thoughts and feelings emerge when they hear this topic. They then engaged in facilitated dialog around how strength is present in their lives. This dialog focused on representing what strength had been to them in their lives (images and patterns given) and what they saw as helpful in their life now (what they needed / wanted).  Activity drew on narrative framework.  Patient Progress: [name] attended for the first few minutes of group, but was called out to meet with provider shortly after it began.

## 2023-03-30 NOTE — Group Note (Signed)
Date:  03/30/2023 Time:  8:56 AM  Group Topic/Focus:  Developing a Wellness Toolbox:   The focus of this group is to help patients develop a "wellness toolbox" with skills and strategies to promote recovery upon discharge.    Participation Level:  Minimal  Participation Quality:  Intrusive   Erasmo Score 03/30/2023, 8:56 AM

## 2023-03-30 NOTE — Plan of Care (Signed)
  Problem: Education: Goal: Knowledge of [place] [facility] Education information/materials will improve Outcome: Completed/Met Goal: Emotional status will improve Outcome: Progressing Goal: Mental status will improve Outcome: Progressing Goal: Verbalization of understanding the information provided will improve Outcome: Progressing   Problem: Activity: Goal: Interest or engagement in activities will improve Outcome: Progressing Goal: Sleeping patterns will improve Outcome: Progressing

## 2023-03-31 DIAGNOSIS — F333 Major depressive disorder, recurrent, severe with psychotic symptoms: Secondary | ICD-10-CM | POA: Diagnosis not present

## 2023-03-31 MED ORDER — NAPHAZOLINE-GLYCERIN 0.012-0.25 % OP SOLN: 1.0000 [drp] | Freq: Four times a day (QID) | OPHTHALMIC | 0 refills | Status: AC | PRN

## 2023-03-31 MED ORDER — FOLIC ACID 1 MG PO TABS: 1.0000 mg | ORAL_TABLET | Freq: Every day | ORAL | 0 refills | Status: AC

## 2023-03-31 MED ORDER — METHOCARBAMOL 750 MG PO TABS: 750.0000 mg | ORAL_TABLET | Freq: Three times a day (TID) | ORAL | 0 refills | Status: AC | PRN

## 2023-03-31 MED ORDER — DOCUSATE SODIUM 100 MG PO CAPS: 100.0000 mg | ORAL_CAPSULE | Freq: Every day | ORAL | 0 refills | Status: AC

## 2023-03-31 MED ORDER — CLONIDINE HCL 0.1 MG PO TABS: 0.1000 mg | ORAL_TABLET | ORAL | 11 refills | Status: DC | PRN

## 2023-03-31 MED ORDER — HYDROXYZINE HCL 25 MG PO TABS
25.0000 mg | ORAL_TABLET | Freq: Three times a day (TID) | ORAL | 0 refills | Status: AC | PRN
Start: 2023-03-31 — End: ?

## 2023-03-31 MED ORDER — NALTREXONE HCL 50 MG PO TABS: 50.0000 mg | ORAL_TABLET | Freq: Every day | ORAL | 0 refills | Status: AC

## 2023-03-31 MED ORDER — ADULT MULTIVITAMIN W/MINERALS CH: 1.0000 | ORAL_TABLET | Freq: Every day | ORAL | 0 refills | Status: AC

## 2023-03-31 MED ORDER — GABAPENTIN 100 MG PO CAPS
100.0000 mg | ORAL_CAPSULE | Freq: Three times a day (TID) | ORAL | 0 refills | Status: AC
Start: 2023-03-31 — End: ?

## 2023-03-31 MED ORDER — FLUOXETINE HCL 20 MG PO CAPS: 60.0000 mg | ORAL_CAPSULE | Freq: Every day | ORAL | 3 refills | Status: AC

## 2023-03-31 MED ORDER — MELATONIN 3 MG PO TABS: 3.0000 mg | ORAL_TABLET | Freq: Every day | ORAL | 0 refills | Status: AC

## 2023-03-31 MED ORDER — HYDROCHLOROTHIAZIDE 25 MG PO TABS: 25.0000 mg | ORAL_TABLET | Freq: Two times a day (BID) | ORAL | 0 refills | Status: DC

## 2023-03-31 NOTE — Progress Notes (Signed)
  [facility] Adult Case Management Discharge Plan :  Will you be returning to the same living situation after discharge:  No. Pt will d/c [facility] in [place], [place]   At discharge, do you have transportation home?: No. CSW will coordinate safe transport  Do you have the ability to pay for your medications: Yes,  [employer]  Release of information consent forms completed and in the chart;  Patient's signature needed at discharge.  Patient to Follow up at:  Follow-up Information     [facility] Go to.   Why: Please go to this provider for an assessment, to obtain therapy and medication management services, during walk in hours 9 am to 2 pm. Contact information: [address] [place] [address] [phone]         [facility] Follow up.   Specialty: Addiction Medicine Why: Referral made Contact information: [address] [place] [address] [phone]         [name], [name]. Call.   Specialty: Family Medicine Why: Please call your primary care provider to schedule a hospital follow up appointment. Contact information: [address] [place] [address] [phone]         [facility]. Call.   Why: You may also call this provider to schedule an appointment for primary care services. Contact information: [address] [place] [address] [phone]        Services[facility] Follow up.   Why: Pt is scheduled to be [facility] to [facility] for services. Contact information: [address] [place] [address] [phone]                Next level of care provider has access to [facility] Link:no   Safety Planning and Suicide Prevention discussed: Yes,  discussed with pt, pt declined to authroize communication with family/ friend     Has patient been referred to the  Quitline?: No, pt is going to [facility]  in [place] for services. Pt has past hx of cigarette smoking, pt currently smokes marajuana no desire to  quit   Patient has been referred for addiction treatment: the patient will follow up with an outpatient provider for addiction medicine/ substance use disorder. To include going to [facility] for services   Steffanie Dunn, LCSW 03/31/2023, 2:30 PM

## 2023-03-31 NOTE — Progress Notes (Signed)
Pt discharged to lobby.Safe transport present to transport pt to [facility] in [place]. Pt was stable and appreciative at that time. All papers and prescriptions were given and valuables returned. Suicide safety plan completed. Verbal understanding expressed. Denies SI/HI and A/VH. Pt given opportunity to express concerns and ask questions.

## 2023-03-31 NOTE — [facility] Suicide Risk Assessment (Addendum)
[facility] Discharge Suicide Risk Assessment   Principal Problem: MDD (major depressive disorder), recurrent, severe, with psychosis (HCC) Discharge Diagnoses: Principal Problem:   MDD (major depressive disorder), recurrent, severe, with psychosis (HCC) Active Problems:   Stimulant use disorder   Alcohol use disorder   GAD (generalized anxiety disorder)  Reason for admission: Patient is a 58 year old male with a psychiatric history of MDD and GAD in addition to alcohol use disorder and stimulant use disorder, who [facility] for evaluation of suicidal thoughts and also suicide attempt by cutting himself superficially with a kitchen knife.  Patient was medically cleared by the outside hospital.  Total Time spent with patient: 45 minutes  Musculoskeletal: Strength & Muscle Tone: within normal limits Gait & Station: normal Patient leans: N/A  Psychiatric Specialty Exam  Presentation  General Appearance:  Appropriate for Environment; Casual  Eye Contact: Good  Speech: Clear and Coherent  Speech Volume: Normal  Handedness: Right  Mood and Affect  Mood: Euthymic  Duration of Depression Symptoms: No data recorded Affect: Congruent  Thought Process  Thought Processes: Coherent  Descriptions of Associations:Intact  Orientation:Full (Time, Place and Person)  Thought Content:Logical  History of Schizophrenia/Schizoaffective disorder:No data recorded Duration of Psychotic Symptoms:No data recorded Hallucinations:Hallucinations: Auditory; Visual Description of Auditory Hallucinations: Patient later endorses auditory hallucination of hearing his deceased mother calling him and saying come home Description of Visual Hallucinations: Endorses seeing his mother but the corner of his eyes when informed of being discharged tomorrow.  Ideas of Reference:None  Suicidal Thoughts:Suicidal Thoughts: No SI Passive Intent and/or  Plan: -- (Denies)  Homicidal Thoughts:Homicidal Thoughts: No  Sensorium  Memory: Immediate Good; Recent Good  Judgment: Fair  Insight: Fair  Art therapist  Concentration: Good  Attention Span: Good  Recall: Good  Fund of Knowledge: Good  Language: Good  Psychomotor Activity  Psychomotor Activity: Psychomotor Activity: Normal  Assets  Assets: Communication Skills; Resilience  Sleep  Sleep: Sleep: Good Number of Hours of Sleep: 7.5  Physical Exam: Physical Exam Vitals and nursing note reviewed.  Constitutional:      General: He is not in acute distress.    Appearance: Normal appearance. He is not ill-appearing, toxic-appearing or diaphoretic.  HENT:     Head: Normocephalic and atraumatic.     Right Ear: External ear normal.     Left Ear: External ear normal.     Nose: Nose normal. No congestion.     Mouth/Throat:     Mouth: Mucous membranes are moist.     Pharynx: Oropharynx is clear.  Eyes:     General:        Right eye: No discharge.        Left eye: No discharge.     Extraocular Movements: Extraocular movements intact.     Conjunctiva/sclera: Conjunctivae normal.  Cardiovascular:     Rate and Rhythm: Normal rate.     Pulses: Normal pulses.  Pulmonary:     Effort: Pulmonary effort is normal. No respiratory distress.  Abdominal:     Comments: Deferred  Genitourinary:    Comments: Deferred Musculoskeletal:        General: No deformity or signs of injury. Normal range of motion.     Cervical back: Normal range of motion. No rigidity.  Skin:    General: Skin is warm.     Coloration: Skin is not jaundiced or pale.  Neurological:     Mental Status: He is alert and oriented to person,  place, and time.     Coordination: Coordination normal.     Gait: Gait normal.  Psychiatric:        Mood and Affect: Mood normal.        Behavior: Behavior normal.   Review of Systems  Constitutional:  Negative for chills, fever and weight loss.   HENT:  Negative for ear discharge and nosebleeds.   Eyes:  Negative for photophobia, discharge and redness.  Respiratory:  Negative for cough.   Cardiovascular:  Negative for chest pain and palpitations.  Gastrointestinal:  Negative for abdominal pain, heartburn, nausea and vomiting.  Genitourinary:  Negative for dysuria, frequency and urgency.  Musculoskeletal:  Negative for falls and myalgias.  Skin:  Negative for itching.  Neurological:  Negative for dizziness, loss of consciousness and headaches.  Endo/Heme/Allergies:        See allergy list   Psychiatric/Behavioral:  Positive for depression (Stable with medication). The patient is nervous/anxious (Improved with medication).    Blood pressure (!) 129/97, pulse 96, temperature (!) 97.4 F (36.3 C), temperature source Oral, resp. rate 16, height 5\' 10"  (1.778 m), weight 89.4 kg, SpO2 97%. Body mass index is 28.27 kg/m.  Mental Status Per Nursing Assessment::   On Admission:  Suicidal ideation indicated by patient  Demographic Factors:  Male, Caucasian, Low socioeconomic status, and Unemployed  Loss Factors: Financial problems/change in socioeconomic status  Historical Factors: Prior suicide attempts, Family history of suicide, and Family history of mental illness or substance abuse  Risk Reduction Factors:   Positive therapeutic relationship and Positive coping skills or problem solving skills  Continued Clinical Symptoms:  Severe Anxiety and/or Agitation Depression:   Comorbid alcohol abuse/dependence Hopelessness Alcohol/Substance Abuse/Dependencies More than one psychiatric diagnosis Previous Psychiatric Diagnoses and Treatments  Cognitive Features That Contribute To Risk:  None    Suicide Risk:  Minimal: No identifiable suicidal ideation.  Patients presenting with no risk factors but with morbid ruminations; may be classified as minimal risk based on the severity of the depressive symptoms   Follow-up Information      [facility] Go to.   Why: Please go to this provider for an assessment, to obtain therapy and medication management services, during walk in hours 9 am to 2 pm. Contact information: [address] [place] [address] [phone]         [facility] Follow up.   Specialty: Addiction Medicine Why: Referral made Contact information: [address] [place] [address] [phone]         [name], [name]. Call.   Specialty: Family Medicine Why: Please call your primary care provider to schedule a hospital follow up appointment. Contact information: [address] [place] [address] [phone]         [facility]. Call.   Why: You may also call this provider to schedule an appointment for primary care services. Contact information: [address] [place] [address] [phone]               Plan Of Care/Follow-up recommendations:  Discharge Recommendations:  The patient is being discharged to [facility] in [place], [place]. Patient is to take his discharge medications as ordered.  See follow up above. We recommend that he participates in individual therapy to target uncontrollable agitation and substance abuse.  We recommend that he participates in therapy to target personal conflict, to improve communication skills and conflict resolution  skills. Patient is to initiate/implement a contingency based behavioral model to address his behavior. We recommend that he gets AIMS scale, height, weight, blood pressure, fasting lipid panel, fasting blood sugar in three months from discharge if he's on atypical antipsychotics.  Patient will benefit from monitoring of recurrent suicidal ideation since patient is on antidepressant medication. The patient should abstain from all illicit substances and alcohol. If the patient's symptoms worsen or do  not continue to improve or if the patient becomes actively suicidal or homicidal then it is recommended that the patient return to the closest hospital emergency room or call 911 for further evaluation and treatment. National Suicide Prevention Lifeline 1800-SUICIDE or 920-428-7139. Please follow up with your primary medical doctor for all other medical needs.  The patient has been educated on the possible side effects to medications and she/her guardian is to contact a medical professional and inform outpatient provider of any new side effects of medication. He is to take regular diet and activity as tolerated.  Will benefit from moderate daily exercise. Patient and Family was educated about removing/locking any firearms, medications or dangerous products from the home.  Activity:  As tolerated Diet:  Regular Diet  Activity:  As tolerated Diet:  Regular   Sergey Ishler-[name] & Ethan Chanan NP-Student  03/31/2023, 2:08 PM

## 2023-03-31 NOTE — Plan of Care (Signed)
   Problem: Education: Goal: Emotional status will improve Outcome: Progressing Goal: Mental status will improve Outcome: Progressing Goal: Verbalization of understanding the information provided will improve Outcome: Progressing   Problem: Activity: Goal: Interest or engagement in activities will improve Outcome: Progressing Goal: Sleeping patterns will improve Outcome: Progressing

## 2023-03-31 NOTE — Progress Notes (Deleted)
  [facility] Adult Case Management Discharge Plan :  Will you be returning to the same living situation after discharge:  No. Pt will d/c [facility] in [place], [place]  At discharge, do you have transportation home?: No. CSW will coordinate transportation Do you have the ability to pay for your medications: Yes,  [employer]  Release of information consent forms completed and in the chart;  Patient's signature needed at discharge.  Patient to Follow up at:  Follow-up Information     [facility] Go to.   Why: Please go to this provider for an assessment, to obtain therapy and medication management services, during walk in hours 9 am to 2 pm. Contact information: [address] [place] [address] [phone]         [facility] Follow up.   Specialty: Addiction Medicine Why: Referral made Contact information: [address] [place] [address] [phone]         [name], [name]. Call.   Specialty: Family Medicine Why: Please call your primary care provider to schedule a hospital follow up appointment. Contact information: [address] [place] [address] [phone]         [facility]. Call.   Why: You may also call this provider to schedule an appointment for primary care services. Contact information: [address] [place] [address] [phone]                Next level of care provider has access to [facility] Link:no  Safety Planning and Suicide Prevention discussed: Yes,  discussed with pt, pt declined to authroize communication with family/ friend    Has patient been referred to the Quitline?: No, pt is going to [facility]  in [place] for services. Pt has past hx of cigarette smoking, pt currently smokes marajuana no desire to  quit  Patient has been referred for addiction  treatment: the patient will follow up with an outpatient provider for addiction medicine/ substance use disorder. To include going to [facility] for services  Steffanie Dunn, LCSWA 03/31/2023, 12:03 PM
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
## 2023-03-31 NOTE — Plan of Care (Signed)
   Problem: Education: Goal: Emotional status will improve Outcome: Progressing Goal: Mental status will improve Outcome: Progressing Goal: Verbalization of understanding the information provided will improve Outcome: Progressing

## 2023-03-31 NOTE — Progress Notes (Signed)
   03/31/23 0800  Psych Admission Type (Psych Patients Only)  Admission Status Voluntary  Psychosocial Assessment  Patient Complaints Anxiety;Irritability  Eye Contact Fair  Facial Expression Sad  Affect Anxious;Depressed  Speech Logical/coherent  Interaction Assertive  Motor Activity Slow  Appearance/Hygiene Poor hygiene  Behavior Characteristics Cooperative;Appropriate to situation  Mood Anxious  Thought Process  Coherency WDL  Content WDL  Delusions None reported or observed  Perception WDL  Hallucination None reported or observed  Judgment Poor  Confusion None  Danger to Self  Current suicidal ideation? Denies  Agreement Not to Harm Self Yes  Description of Agreement verbsl  Danger to Others  Danger to Others None reported or observed

## 2023-04-12 ENCOUNTER — Other Ambulatory Visit: Payer: Self-pay | Admitting: Obstetrics and Gynecology

## 2023-04-12 NOTE — Patient Outreach (Cosign Needed)
  Medicaid Managed Care   Unsuccessful Attempt Note   04/12/2023 Name: [name] MRN: [id number] DOB: 13-Aug-1965  Referred by: [name], [name] Reason for referral : High Risk Managed Medicaid (Unsuccessful telephone outreach)  Third unsuccessful telephone outreach was attempted today. The patient was referred to the case management team for assistance with care management and care coordination. The patient's primary care provider has been notified of our unsuccessful attempts to make or maintain contact with the patient. The care management team is pleased to engage with this patient at any time in the future should he/she be interested in assistance from the care management team.    Follow Up Plan: The  Patient has been provided with contact information for the Managed Medicaid care management team and has been advised to call with any health related questions or concerns. and The Managed Medicaid care management team is available to follow up with the patient after provider conversation with the patient regarding recommendation for care management engagement and subsequent re-referral to the care management team.    [name] RN, BSN, CM [facility] RN Care Manager Direct Dial [phone] Website: [email or web address]

## 2023-04-12 NOTE — Patient Instructions (Signed)
 Visit Information  [name]  - as a part of your Medicaid benefit, you are eligible for care management and care coordination services at no cost or copay. I was unable to reach you by phone today but would be happy to help you with your health related needs. Please feel free to call me at [phone].  [name] RN, BSN, [employer] [facility] RN Care Manager Direct Dial [phone]/GNF [phone] Website: [email or web address]

## 2023-08-05 ENCOUNTER — Encounter (HOSPITAL_COMMUNITY): Payer: Self-pay | Admitting: Interventional Radiology

## 2023-10-04 ENCOUNTER — Emergency Department (HOSPITAL_COMMUNITY)
Admission: EM | Admit: 2023-10-04 | Discharge: 2023-10-04 | Disposition: A | Discharge status: Home / Self Care | Payer: MEDICAID

## 2023-10-04 ENCOUNTER — Other Ambulatory Visit: Payer: Self-pay

## 2023-10-04 DIAGNOSIS — K0889 Other specified disorders of teeth and supporting structures: Secondary | ICD-10-CM | POA: Insufficient documentation

## 2023-10-04 DIAGNOSIS — I10 Essential (primary) hypertension: Secondary | ICD-10-CM | POA: Insufficient documentation

## 2023-10-04 MED ORDER — NAPROXEN 500 MG PO TABS
500.0000 mg | ORAL_TABLET | Freq: Two times a day (BID) | ORAL | 0 refills | Status: AC
Start: 2023-10-04 — End: ?

## 2023-10-04 MED ORDER — AMOXICILLIN-POT CLAVULANATE 875-125 MG PO TABS: 1.0000 | ORAL_TABLET | Freq: Two times a day (BID) | ORAL | 0 refills | Status: AC

## 2023-10-04 MED ORDER — KETOROLAC TROMETHAMINE 15 MG/ML IJ SOLN
15.0000 mg | Freq: Once | INTRAMUSCULAR | Status: AC
  Administered 2023-10-04: 15 mg via INTRAMUSCULAR
  Filled 2023-10-04: qty 1

## 2023-10-04 NOTE — ED Provider Notes (Signed)
 [place] EMERGENCY DEPARTMENT AT [facility] Provider Note   CSN: [id number] Arrival date & time: 10/04/23  1039     Patient presents with: Abscess   [name] is a 58 y.o. male.   Abscess Patient is a 58 year old male up in the ED today for concerns for dental abscess/dental pain and mild facial swelling that been present for the last 2 weeks, noting that he had been needing to see a dentist and has been meaning to call to set up an appointment but has not done that yet..  Medical history of alcohol /stimulant abuse, stroke, hepatitis C, emphysema, HTN.  Denies fever, headache, vision changes, dysphagia, odynophagia, trismus, vertigo, otalgia, nausea, vomiting, chest pain, shortness of breath.     Prior to Admission medications   Medication Sig Start Date End Date Taking? Authorizing Provider  amoxicillin -clavulanate (AUGMENTIN ) 875-125 MG tablet Take 1 tablet by mouth every 12 (twelve) hours for 10 days. 10/04/23 10/14/23 Yes Beola Terrall RAMAN, PA-C  naproxen  (NAPROSYN ) 500 MG tablet Take 1 tablet (500 mg total) by mouth 2 (two) times daily. 10/04/23  Yes Beola Terrall RAMAN, PA-C  atorvastatin  (LIPITOR) 20 MG tablet Take 1 tablet (20 mg total) by mouth daily for 14 days. 12/21/22 01/04/23  [name], [name], [name]  cloNIDine  (CATAPRES ) 0.1 MG tablet Take 1 tablet (0.1 mg total) by mouth every 4 (four) hours as needed (high BP). 03/31/23   Ntuen, Ellouise BROCKS, [name]  docusate sodium  (COLACE) 100 MG capsule Take 1 capsule (100 mg total) by mouth daily. 03/31/23   Ntuen, Tina C, [name]  FLUoxetine  (PROZAC ) 20 MG capsule Take 3 capsules (60 mg total) by mouth daily. 04/01/23   Ntuen, Tina C, [name]  folic acid  (FOLVITE ) 1 MG tablet Take 1 tablet (1 mg total) by mouth daily. 04/01/23   Ntuen, Tina C, [name]  gabapentin  (NEURONTIN ) 100 MG capsule Take 1 capsule (100 mg total) by mouth 3 (three) times daily. 03/31/23   Ntuen, Tina C, [name]  hydrochlorothiazide  (HYDRODIURIL ) 25 MG tablet Take 1 tablet (25 mg  total) by mouth 2 (two) times daily. 03/31/23   Ntuen, Tina C, [name]  hydrOXYzine  (ATARAX ) 25 MG tablet Take 1 tablet (25 mg total) by mouth every 8 (eight) hours as needed for anxiety (insomnia). 03/31/23   Ntuen, Tina C, [name]  melatonin 3 MG TABS tablet Take 1 tablet (3 mg total) by mouth at bedtime. 03/31/23   Ntuen, Tina C, [name]  methocarbamol  (ROBAXIN ) 750 MG tablet Take 1 tablet (750 mg total) by mouth every 8 (eight) hours as needed for muscle spasms. 03/31/23   Ntuen, Tina C, [name]  Multiple Vitamin (MULTIVITAMIN WITH MINERALS) TABS tablet Take 1 tablet by mouth daily. 04/01/23   Ntuen, Tina C, [name]  naltrexone  (DEPADE) 50 MG tablet Take 1 tablet (50 mg total) by mouth daily. 04/01/23   Ntuen, Tina C, [name]  naphazoline-glycerin  (CLEAR EYES REDNESS) 0.012-0.25 % SOLN Place 1-2 drops into both eyes 4 (four) times daily as needed for eye irritation. 03/31/23   Ntuen, Tina C, [name]  pantoprazole  (PROTONIX ) 40 MG tablet Take 1 tablet (40 mg total) by mouth daily. 12/21/22   [name], [name], [name]  VENTOLIN  HFA 108 (90 Base) MCG/ACT inhaler Inhale 2 puffs into the lungs every 6 (six) hours as needed for wheezing or shortness of breath. 12/21/22   [name], [name], [name]    Allergies: Patient has no known allergies.    Review of Systems  HENT:  Positive for dental problem.   All  other systems reviewed and are negative.   Updated Vital Signs BP (!) 137/99   Pulse 100   Temp 97.8 F (36.6 C) (Oral)   Resp 18   Ht 5' 10 (1.778 m)   Wt 90.7 kg   SpO2 96%   BMI 28.70 kg/m   Physical Exam Vitals and nursing note reviewed.  Constitutional:      General: He is not in acute distress.    Appearance: Normal appearance. He is not ill-appearing or diaphoretic.  HENT:     Head: Normocephalic and atraumatic.     Comments: Mild left-sided lower jaw swelling noted, tender to palpation.    Mouth/Throat:     Mouth: Mucous membranes are moist.     Pharynx: Oropharynx is clear. No oropharyngeal exudate or posterior  oropharyngeal erythema.     Comments: Notably has dental caries and degraded teeth at 17 and 18, that are tender to palpation.  No obvious abscess present intraorally.  No submental or sublingual swelling/tenderness.  Uvula is midline, oropharynx is patent. Eyes:     General: No scleral icterus.       Right eye: No discharge.        Left eye: No discharge.     Extraocular Movements: Extraocular movements intact.     Conjunctiva/sclera: Conjunctivae normal.  Cardiovascular:     Rate and Rhythm: Normal rate and regular rhythm.     Pulses: Normal pulses.     Heart sounds: Normal heart sounds. No murmur heard.    No friction rub. No gallop.  Pulmonary:     Effort: Pulmonary effort is normal. No respiratory distress.     Breath sounds: No stridor. No wheezing, rhonchi or rales.  Chest:     Chest wall: No tenderness.  Abdominal:     General: Abdomen is flat. There is no distension.     Palpations: Abdomen is soft.     Tenderness: There is no abdominal tenderness. There is no right CVA tenderness, left CVA tenderness, guarding or rebound.  Musculoskeletal:        General: No swelling, deformity or signs of injury.     Cervical back: Normal range of motion. No rigidity.     Right lower leg: No edema.     Left lower leg: No edema.  Skin:    General: Skin is warm and dry.     Findings: No bruising, erythema or lesion.  Neurological:     General: No focal deficit present.     Mental Status: He is alert and oriented to person, place, and time. Mental status is at baseline.     Sensory: No sensory deficit.     Motor: No weakness.  Psychiatric:        Mood and Affect: Mood normal.     (all labs ordered are listed, but only abnormal results are displayed) Labs Reviewed - No data to display  EKG: None  Radiology: No results found.  Procedures   Medications Ordered in the ED  ketorolac  (TORADOL ) 15 MG/ML injection 15 mg (has no administration in time range)                                 Medical Decision Making  This patient is a 58 year old male who presents to the ED for concern of left-sided dental pain over teeth 17 and 18, with mild left-sided facial swelling present for the last 2 weeks, noting that  he has a dentist for which she has been to schedule appointment for but has not been able to schedule it.  Reports that he has plans to schedule a visit this week.  Denies any other infectious symptoms, low suspicion for deep space infections.  On physical exam, patient is in no acute distress, afebrile, alert and orient x 4, speaking in full sentences, nontachypneic, nontachycardic.  Exam does not show any acute signs of deep space abscess.  Mild left-sided lower jaw swelling, with no submental or sublingual swelling.  Oropharynx is patent and uvula is midline.  Exam is otherwise unremarkable.  Suspecting likely abscess secondary to caries, with patient having multiple caries in his mouth with tenderness noted over teeth 17 and 18.  Will send him home with naproxen  and Augmentin  and have him continue to follow-up with dentist.   Patient vital signs have remained stable throughout the course of patient's time in the ED. Low suspicion for any other emergent pathology at this time. I believe this patient is safe to be discharged. Provided strict return to ER precautions. Patient expressed agreement and understanding of plan. All questions were answered.  Differential diagnoses prior to evaluation: The emergent differential diagnosis includes, but is not limited to, dental caries, acute necrotizing ulcerative gingivitis, dental abscess, Ludwig angina, peritonsillar abscess, retropharyngeal abscess, osteitis. This is not an exhaustive differential.   Past Medical History / Co-morbidities / Social History: Chronic hepatitis C, stroke, hiatal hernia, HTN, emphysema, polysubstance abuse, frequent abscesses  Additional history: Chart reviewed. Pertinent results include:  Last  discharged from the hospital for SI on 03/23/2023 noted also had a previous history of stimulant use and alcohol  use disorders.   Medications: I ordered medication including, Toradol , naproxen .  I have reviewed the patients home medicines and have made adjustments as needed.  Critical Interventions: None  Social Determinants of Health: Is currently being scheduled to have an appoint with dentistry but does not have a dentist officially  Disposition: After consideration of the diagnostic results and the patients response to treatment, I feel that the patient would benefit from discharge and shortness above.   emergency department workup does not suggest an emergent condition requiring admission or immediate intervention beyond what has been performed at this time. The plan is: Follow-up with dentist, Augmentin  and naproxen  for pain relief, return for new or worsening symptoms. The patient is safe for discharge and has been instructed to return immediately for worsening symptoms, change in symptoms or any other concerns.   Final diagnoses:  Pain, dental    ED Discharge Orders          Ordered    naproxen  (NAPROSYN ) 500 MG tablet  2 times daily        10/04/23 1142    amoxicillin -clavulanate (AUGMENTIN ) 875-125 MG tablet  Every 12 hours        10/04/23 1142               Beola Terrall RAMAN, PA-C 10/04/23 1144    Ula Prentice SAUNDERS, MD 10/04/23 1415

## 2023-10-04 NOTE — Discharge Instructions (Signed)
 You were seen today for dental pain with likely abscess.  No drainable abscesses noted today.  Will have you continue to place warm compresses over the area of swelling as well as continuing to use naproxen  and Tylenol  as pain relief.  Take Tylenol  (acetominophen)  650mg  every 4-6 hours, as needed for pain or fever. Do not take more than 4,000 mg in a 24-hour period. As this may cause liver damage. While this is rare, if you begin to develop yellowing of the skin or eyes, stop taking and return to ER immediately.  Please take Naprosyn , 500mg  by mouth twice daily as needed for pain - this in an antiinflammatory medicine (NSAID) and is similar to ibuprofen  - many people feel that it is stronger than ibuprofen  and it is easier to take since it is a smaller pill.  Please use this only for 1 week - if your pain persists, you will need to follow up with your doctor in the office for ongoing guidance and pain control.   Additionally I am prescribing an antibiotic for you to take twice a day for the next 10 days.  Please do not take antibiotics and/or pain medicine with alcohol  as this can cause stomach ulcerations and further complications as well as decrease the effectiveness of the medications.  Return to the ED for any new or worsening symptoms.  However will need you to follow-up with dentistry as this will be the only long-term solution for you to have this resolved.

## 2023-10-04 NOTE — ED Triage Notes (Signed)
 Pt c/o left side pain from abscess and teeth x 1 week.

## 2023-12-14 ENCOUNTER — Emergency Department (HOSPITAL_COMMUNITY)
Admission: EM | Admit: 2023-12-14 | Discharge: 2023-12-15 | Disposition: A | Discharge status: Home / Self Care | Payer: MEDICAID | Attending: Emergency Medicine | Admitting: Emergency Medicine

## 2023-12-14 ENCOUNTER — Other Ambulatory Visit: Payer: Self-pay

## 2023-12-14 ENCOUNTER — Emergency Department (HOSPITAL_COMMUNITY): Payer: MEDICAID

## 2023-12-14 ENCOUNTER — Encounter (HOSPITAL_COMMUNITY): Payer: Self-pay

## 2023-12-14 DIAGNOSIS — Z8673 Personal history of transient ischemic attack (TIA), and cerebral infarction without residual deficits: Secondary | ICD-10-CM | POA: Diagnosis not present

## 2023-12-14 DIAGNOSIS — R079 Chest pain, unspecified: Secondary | ICD-10-CM

## 2023-12-14 DIAGNOSIS — Z79899 Other long term (current) drug therapy: Secondary | ICD-10-CM | POA: Diagnosis not present

## 2023-12-14 DIAGNOSIS — F419 Anxiety disorder, unspecified: Secondary | ICD-10-CM | POA: Diagnosis not present

## 2023-12-14 DIAGNOSIS — I10 Essential (primary) hypertension: Secondary | ICD-10-CM | POA: Insufficient documentation

## 2023-12-14 HISTORY — DX: Acute respiratory failure with hypoxia: J96.01

## 2023-12-14 HISTORY — DX: Other tear of medial meniscus, current injury, left knee, initial encounter: S83.242A

## 2023-12-14 HISTORY — DX: Acute embolism and thrombosis of unspecified deep veins of unspecified lower extremity: I82.409

## 2023-12-14 MED ORDER — ONDANSETRON HCL 4 MG/2ML IJ SOLN
4.0000 mg | Freq: Once | INTRAMUSCULAR | Status: AC
  Administered 2023-12-15: 4 mg via INTRAVENOUS
  Filled 2023-12-14: qty 2

## 2023-12-14 MED ORDER — AMLODIPINE BESYLATE 10 MG PO TABS: 10.0000 mg | ORAL_TABLET | Freq: Every day | ORAL | 0 refills | Status: AC

## 2023-12-14 MED ORDER — MORPHINE SULFATE (PF) 4 MG/ML IV SOLN
4.0000 mg | Freq: Once | INTRAVENOUS | Status: AC
  Administered 2023-12-15: 4 mg via INTRAVENOUS
  Filled 2023-12-14: qty 1

## 2023-12-14 MED ORDER — AMLODIPINE BESYLATE 5 MG PO TABS
10.0000 mg | ORAL_TABLET | Freq: Once | ORAL | Status: AC
  Administered 2023-12-14: 10 mg via ORAL
  Filled 2023-12-14: qty 2

## 2023-12-14 NOTE — ED Triage Notes (Signed)
 Pt from home with hypertension and anxiety. Pt non-compliant with home medications because he has no transportation to be able to pick up meds from pharmacy.

## 2023-12-14 NOTE — ED Provider Notes (Signed)
  Provider Note MRN:  [id number]  Arrival date & time: 12/15/23    ED Course and Medical Decision Making  Assumed care of patient at sign-out or upon transfer.  Chest pain for several days favored noncardiac, has been out of his medicines recently.  Hypertensive here in the emergency department, provide amlodipine , follow-up troponin.  1 AM update: Troponin negative, blood pressure downtrending.  Appropriate for discharge.  Procedures  Final Clinical Impressions(s) / ED Diagnoses     ICD-10-CM   1. Hypertension, unspecified type  I10     2. Chest pain, unspecified type  R07.9       ED Discharge Orders          Ordered    amLODipine  (NORVASC ) 10 MG tablet  Daily        12/14/23 2357              Discharge Instructions      It is important that you take your blood pressure medications daily as directed.  Please call your primary care provider to arrange follow-up appointment soon.  Return to emergency department for any new or worsening symptoms      Ozell HERO. Theadore, MD [facility] [email or web address]    Theadore Ozell HERO, MD 12/15/23 [phone]

## 2023-12-14 NOTE — Discharge Instructions (Addendum)
 It is important that you take your blood pressure medications daily as directed.  Please call your primary care provider to arrange follow-up appointment soon.  Return to emergency department for any new or worsening symptoms

## 2023-12-14 NOTE — ED Provider Notes (Signed)
 [name] EMERGENCY DEPARTMENT AT [facility] Provider Note   CSN: [id number] Arrival date & time: 12/14/23  2112     Patient presents with: Hypertension and Anxiety   [name] is a 58 y.o. male.    Hypertension Associated symptoms include chest pain. Pertinent negatives include no abdominal pain, no headaches and no shortness of breath.  Anxiety Associated symptoms include chest pain. Pertinent negatives include no abdominal pain, no headaches and no shortness of breath.       [name] is a 58 y.o. male past medical history of hepatitis C, hypertension, emphysema, prior stroke, chronic hepatitis, anxiety, alcohol  use disorder, GERD who presents to the Emergency Department requesting evaluation for chest pain and elevated blood pressure.  States he has been out of his blood pressure medication for approximately 2 months.  Has been unable to follow-up with his primary care provider and ran out of his medication.  He is here requesting refills for his blood pressure medicine and his risperidone .  He is also complaining of chest pressure for several days.  Describes having an heaviness to his upper chest.  No associated shortness of breath arm pain neck or jaw pain.  Patient states that he is upset that his significant other is currently taking care of of someone else and has not been helping care for him at home or taking him to his appointments. States he is feeling anxious  Prior to Admission medications   Medication Sig Start Date End Date Taking? Authorizing Provider  atorvastatin  (LIPITOR) 20 MG tablet Take 1 tablet (20 mg total) by mouth daily for 14 days. 12/21/22 01/04/23  Bacchus, Meade PEDLAR, [name]  cloNIDine  (CATAPRES ) 0.1 MG tablet Take 1 tablet (0.1 mg total) by mouth every 4 (four) hours as needed (high BP). 03/31/23   Ntuen, Ellouise BROCKS, [name]  docusate sodium  (COLACE) 100 MG capsule Take 1 capsule (100 mg total) by mouth daily. 03/31/23   Ntuen, Tina C, [name]   FLUoxetine  (PROZAC ) 20 MG capsule Take 3 capsules (60 mg total) by mouth daily. 04/01/23   Ntuen, Tina C, [name]  folic acid  (FOLVITE ) 1 MG tablet Take 1 tablet (1 mg total) by mouth daily. 04/01/23   Ntuen, Tina C, [name]  gabapentin  (NEURONTIN ) 100 MG capsule Take 1 capsule (100 mg total) by mouth 3 (three) times daily. 03/31/23   Ntuen, Tina C, [name]  hydrochlorothiazide  (HYDRODIURIL ) 25 MG tablet Take 1 tablet (25 mg total) by mouth 2 (two) times daily. 03/31/23   Ntuen, Tina C, [name]  hydrOXYzine  (ATARAX ) 25 MG tablet Take 1 tablet (25 mg total) by mouth every 8 (eight) hours as needed for anxiety (insomnia). 03/31/23   Ntuen, Tina C, [name]  melatonin 3 MG TABS tablet Take 1 tablet (3 mg total) by mouth at bedtime. 03/31/23   Ntuen, Tina C, [name]  methocarbamol  (ROBAXIN ) 750 MG tablet Take 1 tablet (750 mg total) by mouth every 8 (eight) hours as needed for muscle spasms. 03/31/23   Ntuen, Tina C, [name]  Multiple Vitamin (MULTIVITAMIN WITH MINERALS) TABS tablet Take 1 tablet by mouth daily. 04/01/23   Ntuen, Tina C, [name]  naltrexone  (DEPADE) 50 MG tablet Take 1 tablet (50 mg total) by mouth daily. 04/01/23   Ntuen, Tina C, [name]  naphazoline-glycerin  (CLEAR EYES REDNESS) 0.012-0.25 % SOLN Place 1-2 drops into both eyes 4 (four) times daily as needed for eye irritation. 03/31/23   Ntuen, Tina C, [name]  naproxen  (NAPROSYN ) 500 MG tablet Take 1 tablet (500  mg total) by mouth 2 (two) times daily. 10/04/23   Beola Terrall RAMAN, PA-C  pantoprazole  (PROTONIX ) 40 MG tablet Take 1 tablet (40 mg total) by mouth daily. 12/21/22   Bacchus, Meade PEDLAR, [name]  VENTOLIN  HFA 108 (90 Base) MCG/ACT inhaler Inhale 2 puffs into the lungs every 6 (six) hours as needed for wheezing or shortness of breath. 12/21/22   Bacchus, Meade PEDLAR, [name]    Allergies: Patient has no known allergies.    Review of Systems  Constitutional:  Negative for chills and fever.  Respiratory:  Negative for cough and shortness of breath.   Cardiovascular:  Positive for chest  pain. Negative for leg swelling.  Gastrointestinal:  Negative for abdominal pain, diarrhea, nausea and vomiting.  Musculoskeletal:  Negative for arthralgias.  Neurological:  Negative for dizziness, syncope, weakness, numbness and headaches.    Updated Vital Signs BP (!) 160/92   Pulse 93   Temp (!) 97.5 F (36.4 C) (Oral)   Resp 13   Ht 5' 10 (1.778 m)   Wt 88.5 kg   SpO2 98%   BMI 27.98 kg/m   Physical Exam Vitals and nursing note reviewed.  Constitutional:      General: He is not in acute distress.    Appearance: Normal appearance. He is not ill-appearing or toxic-appearing.  HENT:     Mouth/Throat:     Mouth: Mucous membranes are moist.  Cardiovascular:     Rate and Rhythm: Normal rate and regular rhythm.     Pulses: Normal pulses.  Pulmonary:     Effort: Pulmonary effort is normal.  Abdominal:     Palpations: Abdomen is soft.     Tenderness: There is no abdominal tenderness.  Musculoskeletal:     Right lower leg: No edema.     Left lower leg: No edema.  Skin:    General: Skin is warm.     Capillary Refill: Capillary refill takes less than 2 seconds.  Neurological:     General: No focal deficit present.     Mental Status: He is alert.     Sensory: No sensory deficit.     Motor: No weakness.     (all labs ordered are listed, but only abnormal results are displayed) Labs Reviewed  BASIC METABOLIC PANEL WITH GFR  CBC WITH DIFFERENTIAL/PLATELET  TROPONIN T, HIGH SENSITIVITY    EKG: None  Radiology: DG Chest Portable 1 View Result Date: 12/14/2023 CLINICAL DATA:  Mid chest pain, dizziness EXAM: PORTABLE CHEST 1 VIEW COMPARISON:  09/01/2022, 02/04/2023 FINDINGS: Single frontal view of the chest demonstrates a stable cardiac silhouette. Stable lobular 1.9 cm right upper lobe pulmonary nodule, previously evaluated by PET scan. No acute airspace disease, effusion, or pneumothorax. No acute bony abnormalities. IMPRESSION: 1. No acute airspace disease. 2. Stable  lobular right upper lobe pulmonary nodule, previously evaluated by PET scan. Electronically Signed   By: Ozell Daring M.D.   On: 12/14/2023 23:09     Procedures   Medications Ordered in the ED  amLODipine  (NORVASC ) tablet 10 mg (10 mg Oral Given 12/14/23 2243)                                    Medical Decision Making   Patient here for evaluation of elevated blood pressure and chest pain.  Also having some anxiety.  Describes pressure of his chest for several days.  No associated nausea, vomiting diaphoresis  arm neck or jaw pain.  Has been out of all of his medications for 2 months.  Hypertensive here, on review of medical records he was on 10 mg amlodipine  initially for his blood pressure, but was prescribed hydrochlorothiazide  and clonidine  from behavioral health.  He states the amlodipine  worked better for him.  He is only requesting refills for his blood pressure medication  Doubt chest pain is related to ACS.  No dyspnea to suggest PE.  Hypertensive urgency versus crisis  Amount and/or Complexity of Data Reviewed Labs: ordered.    Details: Labs pending Radiology: ordered.    Details: Chest x-ray shows no acute airspace disease ECG/medicine tests: ordered.    Details: EKG shows sinus rhythm Discussion of management or test interpretation with external provider(s):   Patient hypertensive here.  Blood pressure addressed here with oral amlodipine .  Will provide short course of refills for his blood pressure medications.  Anticipate discharge home.   Discussed with overnight provider, Dr. Theadore who agrees to review labs and arrange appropriate dispo  Risk Prescription drug management.        Final diagnoses:  Hypertension, unspecified type  Chest pain, unspecified type    ED Discharge Orders     None          Herlinda Milling, PA-C 12/14/23 2357    Yolande Lamar BROCKS, MD 12/19/23 [phone]

## 2023-12-15 LAB — CBC WITH DIFFERENTIAL/PLATELET
Abs Immature Granulocytes: 0.12 K/uL — ABNORMAL HIGH (ref 0.00–0.07)
Basophils Absolute: 0.1 K/uL (ref 0.0–0.1)
Basophils Relative: 1 %
Eosinophils Absolute: 0.2 K/uL (ref 0.0–0.5)
Eosinophils Relative: 2 %
HCT: 46.3 % (ref 39.0–52.0)
Hemoglobin: 15.7 g/dL (ref 13.0–17.0)
Immature Granulocytes: 1 %
Lymphocytes Relative: 29 %
Lymphs Abs: 3 K/uL (ref 0.7–4.0)
MCH: 32.2 pg (ref 26.0–34.0)
MCHC: 33.9 g/dL (ref 30.0–36.0)
MCV: 95.1 fL (ref 80.0–100.0)
Monocytes Absolute: 0.6 K/uL (ref 0.1–1.0)
Monocytes Relative: 6 %
Neutro Abs: 6.2 K/uL (ref 1.7–7.7)
Neutrophils Relative %: 61 %
Platelets: 297 K/uL (ref 150–400)
RBC: 4.87 MIL/uL (ref 4.22–5.81)
RDW: 13.2 % (ref 11.5–15.5)
WBC: 10.2 K/uL (ref 4.0–10.5)
nRBC: 0 % (ref 0.0–0.2)

## 2023-12-15 LAB — BASIC METABOLIC PANEL WITH GFR
Anion gap: 12 (ref 5–15)
BUN: 15 mg/dL (ref 6–20)
CO2: 25 mmol/L (ref 22–32)
Calcium: 9.2 mg/dL (ref 8.9–10.3)
Chloride: 106 mmol/L (ref 98–111)
Creatinine, Ser: 0.79 mg/dL (ref 0.61–1.24)
GFR, Estimated: >60 mL/min (ref >60)
Glucose, Bld: 83 mg/dL (ref 70–99)
Potassium: 4.1 mmol/L (ref 3.5–5.1)
Sodium: 142 mmol/L (ref 135–145)

## 2023-12-15 LAB — TROPONIN T, HIGH SENSITIVITY
Troponin T High Sensitivity: <15 ng/L (ref 0–19)
Troponin T High Sensitivity: <15 ng/L (ref 0–19)

## 2023-12-18 ENCOUNTER — Encounter (HOSPITAL_COMMUNITY): Payer: Self-pay

## 2023-12-18 ENCOUNTER — Emergency Department (HOSPITAL_COMMUNITY)
Admission: EM | Admit: 2023-12-18 | Discharge: 2023-12-19 | Disposition: A | Discharge status: Other Acute Inpatient Hospital | Payer: MEDICAID | Attending: Emergency Medicine | Admitting: Emergency Medicine

## 2023-12-18 ENCOUNTER — Other Ambulatory Visit: Payer: Self-pay

## 2023-12-18 DIAGNOSIS — F332 Major depressive disorder, recurrent severe without psychotic features: Secondary | ICD-10-CM

## 2023-12-18 DIAGNOSIS — F411 Generalized anxiety disorder: Secondary | ICD-10-CM

## 2023-12-18 DIAGNOSIS — I10 Essential (primary) hypertension: Secondary | ICD-10-CM | POA: Insufficient documentation

## 2023-12-18 DIAGNOSIS — R45851 Suicidal ideations: Secondary | ICD-10-CM

## 2023-12-18 DIAGNOSIS — F1020 Alcohol dependence, uncomplicated: Secondary | ICD-10-CM | POA: Diagnosis not present

## 2023-12-18 DIAGNOSIS — F1410 Cocaine abuse, uncomplicated: Secondary | ICD-10-CM

## 2023-12-18 DIAGNOSIS — F32A Depression, unspecified: Secondary | ICD-10-CM

## 2023-12-18 DIAGNOSIS — F1420 Cocaine dependence, uncomplicated: Secondary | ICD-10-CM | POA: Insufficient documentation

## 2023-12-18 DIAGNOSIS — F1010 Alcohol abuse, uncomplicated: Secondary | ICD-10-CM

## 2023-12-18 LAB — COMPREHENSIVE METABOLIC PANEL WITH GFR
ALT: 24 U/L (ref 0–44)
AST: 18 U/L (ref 15–41)
Albumin: 4.3 g/dL (ref 3.5–5.0)
Alkaline Phosphatase: 120 U/L (ref 38–126)
Anion gap: 11 (ref 5–15)
BUN: 18 mg/dL (ref 6–20)
CO2: 26 mmol/L (ref 22–32)
Calcium: 9.4 mg/dL (ref 8.9–10.3)
Chloride: 102 mmol/L (ref 98–111)
Creatinine, Ser: 0.86 mg/dL (ref 0.61–1.24)
GFR, Estimated: >60 mL/min (ref >60)
Glucose, Bld: 87 mg/dL (ref 70–99)
Potassium: 4.2 mmol/L (ref 3.5–5.1)
Sodium: 139 mmol/L (ref 135–145)
Total Bilirubin: 0.3 mg/dL (ref 0.0–1.2)
Total Protein: 7 g/dL (ref 6.5–8.1)

## 2023-12-18 LAB — CBC WITH DIFFERENTIAL/PLATELET
Abs Immature Granulocytes: 0.08 K/uL — ABNORMAL HIGH (ref 0.00–0.07)
Basophils Absolute: 0.1 K/uL (ref 0.0–0.1)
Basophils Relative: 1 %
Eosinophils Absolute: 0.1 K/uL (ref 0.0–0.5)
Eosinophils Relative: 1 %
HCT: 43.6 % (ref 39.0–52.0)
Hemoglobin: 14.7 g/dL (ref 13.0–17.0)
Immature Granulocytes: 1 %
Lymphocytes Relative: 20 %
Lymphs Abs: 2.3 K/uL (ref 0.7–4.0)
MCH: 32 pg (ref 26.0–34.0)
MCHC: 33.7 g/dL (ref 30.0–36.0)
MCV: 95 fL (ref 80.0–100.0)
Monocytes Absolute: 0.7 K/uL (ref 0.1–1.0)
Monocytes Relative: 6 %
Neutro Abs: 8.1 K/uL — ABNORMAL HIGH (ref 1.7–7.7)
Neutrophils Relative %: 71 %
Platelets: 290 K/uL (ref 150–400)
RBC: 4.59 MIL/uL (ref 4.22–5.81)
RDW: 13.1 % (ref 11.5–15.5)
WBC: 11.3 K/uL — ABNORMAL HIGH (ref 4.0–10.5)
nRBC: 0 % (ref 0.0–0.2)

## 2023-12-18 LAB — SALICYLATE LEVEL: Salicylate Lvl: <7 mg/dL — ABNORMAL LOW (ref 7.0–30.0)

## 2023-12-18 LAB — URINE DRUG SCREEN
Amphetamines: NEGATIVE
Barbiturates: NEGATIVE
Benzodiazepines: NEGATIVE
Cocaine: POSITIVE — AB
Fentanyl: NEGATIVE
Methadone Scn, Ur: NEGATIVE
Opiates: NEGATIVE
Tetrahydrocannabinol: POSITIVE — AB

## 2023-12-18 LAB — TROPONIN T, HIGH SENSITIVITY: Troponin T High Sensitivity: <15 ng/L (ref 0–19)

## 2023-12-18 LAB — URINALYSIS, ROUTINE W REFLEX MICROSCOPIC
Bilirubin Urine: NEGATIVE
Glucose, UA: 50 mg/dL — AB
Hgb urine dipstick: NEGATIVE
Ketones, ur: NEGATIVE mg/dL
Leukocytes,Ua: NEGATIVE
Nitrite: NEGATIVE
Protein, ur: NEGATIVE mg/dL
Specific Gravity, Urine: 1.024 (ref 1.005–1.030)
pH: 5 (ref 5.0–8.0)

## 2023-12-18 LAB — ETHANOL: Alcohol, Ethyl (B): <15 mg/dL (ref <15)

## 2023-12-18 LAB — ACETAMINOPHEN LEVEL: Acetaminophen (Tylenol), Serum: <10 ug/mL — ABNORMAL LOW (ref 10–30)

## 2023-12-18 MED ORDER — ADULT MULTIVITAMIN W/MINERALS CH
1.0000 | ORAL_TABLET | Freq: Every day | ORAL | Status: DC
  Administered 2023-12-18: 1 via ORAL
  Filled 2023-12-18: qty 1

## 2023-12-18 MED ORDER — LORAZEPAM 1 MG PO TABS
1.0000 mg | ORAL_TABLET | Freq: Once | ORAL | Status: AC
  Administered 2023-12-18: 1 mg via ORAL
  Filled 2023-12-18: qty 1

## 2023-12-18 MED ORDER — LORAZEPAM 1 MG PO TABS
1.0000 mg | ORAL_TABLET | ORAL | Status: DC | PRN
  Administered 2023-12-18 – 2023-12-19 (×2): 1 mg via ORAL
  Filled 2023-12-18 (×2): qty 1

## 2023-12-18 MED ORDER — IBUPROFEN 400 MG PO TABS: 600.0000 mg | ORAL_TABLET | Freq: Three times a day (TID) | ORAL | Status: DC | PRN

## 2023-12-18 MED ORDER — AMLODIPINE BESYLATE 5 MG PO TABS
10.0000 mg | ORAL_TABLET | Freq: Every day | ORAL | Status: DC
  Administered 2023-12-19: 10 mg via ORAL
  Filled 2023-12-18: qty 2

## 2023-12-18 MED ORDER — THIAMINE HCL 100 MG PO TABS: 100.0000 mg | ORAL_TABLET | Freq: Every day | ORAL | Status: DC

## 2023-12-18 MED ORDER — FOLIC ACID 1 MG PO TABS
1.0000 mg | ORAL_TABLET | Freq: Every day | ORAL | Status: DC
  Filled 2023-12-18: qty 1

## 2023-12-18 MED ORDER — LORAZEPAM 2 MG/ML IJ SOLN: 1.0000 mg | INTRAMUSCULAR | Status: DC | PRN

## 2023-12-18 MED ORDER — FLUOXETINE HCL 20 MG PO CAPS
60.0000 mg | ORAL_CAPSULE | Freq: Every day | ORAL | Status: DC
  Administered 2023-12-19: 60 mg via ORAL
  Filled 2023-12-18: qty 3

## 2023-12-18 MED ORDER — THIAMINE MONONITRATE 100 MG PO TABS
100.0000 mg | ORAL_TABLET | Freq: Every day | ORAL | Status: DC
  Administered 2023-12-18: 100 mg via ORAL
  Filled 2023-12-18: qty 1

## 2023-12-18 MED ORDER — THIAMINE HCL 100 MG/ML IJ SOLN: 100.0000 mg | Freq: Every day | INTRAMUSCULAR | Status: DC

## 2023-12-18 MED ORDER — HYDROXYZINE HCL 25 MG PO TABS: 25.0000 mg | ORAL_TABLET | Freq: Three times a day (TID) | ORAL | Status: DC | PRN

## 2023-12-18 MED ORDER — FOLIC ACID 1 MG PO TABS
1.0000 mg | ORAL_TABLET | Freq: Every day | ORAL | Status: DC
  Administered 2023-12-18 – 2023-12-19 (×2): 1 mg via ORAL
  Filled 2023-12-18: qty 1

## 2023-12-18 MED ORDER — LACTATED RINGERS IV BOLUS
1000.0000 mL | Freq: Once | INTRAVENOUS | Status: AC
  Administered 2023-12-18: 1000 mL via INTRAVENOUS

## 2023-12-18 MED ORDER — MELATONIN 3 MG PO TABS: 3.0000 mg | ORAL_TABLET | Freq: Every day | ORAL | Status: DC

## 2023-12-18 MED ORDER — GABAPENTIN 100 MG PO CAPS
100.0000 mg | ORAL_CAPSULE | Freq: Three times a day (TID) | ORAL | Status: DC
  Administered 2023-12-19: 100 mg via ORAL
  Filled 2023-12-18: qty 1

## 2023-12-18 NOTE — ED Notes (Signed)
 Pt's clothes, shoes, and wallet are in locker 11.

## 2023-12-18 NOTE — ED Notes (Signed)
 Pt has been accepted to [facility] [place] for tomorrow. Accepting MD is Oneil Charleston. Reporting number is [phone]

## 2023-12-18 NOTE — Progress Notes (Signed)
 [facility] LCSW Progress Note   12/18/2023    5:51 PM  Jarryn Altland   [id number]   Type of Contact and Topic:  Psychiatric Bed Placement   Pt accepted to [facility]    Patient meets inpatient criteria per Cathaleen Adam, PMHNP    The attending provider will be Dr. Oneil Charleston  Call report to [phone]  Metta Ditch, RN @ [facility] ED notified.     Pt scheduled  to arrive at [facility] for TOMORROW (11/10).    Jessie Schrieber, MSW, LCSW-A  5:52 PM 12/18/2023

## 2023-12-18 NOTE — ED Provider Notes (Cosign Needed Addendum)
 [facility] AT [facility] Provider Note   CSN: [id number] Arrival date & time: 12/18/23  1529     Patient presents with: Suicidal   [name] is a 58 y.o. male.   Patient is a 57 year old male who presents to the emergency department with a chief complaint of suicidal thoughts, increased depression, anxiety and chest pain.  Patient notes that symptoms have been ongoing for the past few months but have become worse today.  He notes that he has been using alcohol  and illicit substances.  Patient notes that he has not directly done anything to harm himself but does have a plan to walk into traffic.  Patient denies any abdominal pain, nausea, vomiting, diarrhea.  He notes that he does drink on a daily basis but denies any history of withdrawal or withdrawal seizures.  He denies any associated shortness of breath at this time.  He denies any hallucinations or delusions.        Prior to Admission medications   Medication Sig Start Date End Date Taking? Authorizing Provider  amLODipine  (NORVASC ) 10 MG tablet Take 1 tablet (10 mg total) by mouth daily. 12/14/23   Triplett, Tammy, PA-C  atorvastatin  (LIPITOR) 20 MG tablet Take 1 tablet (20 mg total) by mouth daily for 14 days. 12/21/22 01/04/23  Bacchus, Meade PEDLAR, [name]  docusate sodium  (COLACE) 100 MG capsule Take 1 capsule (100 mg total) by mouth daily. 03/31/23   Ntuen, Tina C, [name]  FLUoxetine  (PROZAC ) 20 MG capsule Take 3 capsules (60 mg total) by mouth daily. 04/01/23   Ntuen, Tina C, [name]  folic acid  (FOLVITE ) 1 MG tablet Take 1 tablet (1 mg total) by mouth daily. 04/01/23   Ntuen, Tina C, [name]  gabapentin  (NEURONTIN ) 100 MG capsule Take 1 capsule (100 mg total) by mouth 3 (three) times daily. 03/31/23   Ntuen, Tina C, [name]  hydrOXYzine  (ATARAX ) 25 MG tablet Take 1 tablet (25 mg total) by mouth every 8 (eight) hours as needed for anxiety (insomnia). 03/31/23   Ntuen, Tina C, [name]  melatonin 3 MG TABS tablet Take 1  tablet (3 mg total) by mouth at bedtime. 03/31/23   Ntuen, Tina C, [name]  methocarbamol  (ROBAXIN ) 750 MG tablet Take 1 tablet (750 mg total) by mouth every 8 (eight) hours as needed for muscle spasms. 03/31/23   Ntuen, Tina C, [name]  Multiple Vitamin (MULTIVITAMIN WITH MINERALS) TABS tablet Take 1 tablet by mouth daily. 04/01/23   Ntuen, Tina C, [name]  naltrexone  (DEPADE) 50 MG tablet Take 1 tablet (50 mg total) by mouth daily. 04/01/23   Ntuen, Tina C, [name]  naphazoline-glycerin  (CLEAR EYES REDNESS) 0.012-0.25 % SOLN Place 1-2 drops into both eyes 4 (four) times daily as needed for eye irritation. 03/31/23   Ntuen, Tina C, [name]  naproxen  (NAPROSYN ) 500 MG tablet Take 1 tablet (500 mg total) by mouth 2 (two) times daily. 10/04/23   Beola Terrall RAMAN, PA-C  pantoprazole  (PROTONIX ) 40 MG tablet Take 1 tablet (40 mg total) by mouth daily. 12/21/22   Bacchus, Meade PEDLAR, [name]  VENTOLIN  HFA 108 (90 Base) MCG/ACT inhaler Inhale 2 puffs into the lungs every 6 (six) hours as needed for wheezing or shortness of breath. 12/21/22   Bacchus, Meade PEDLAR, [name]    Allergies: Patient has no known allergies.    Review of Systems  Cardiovascular:  Positive for chest pain.  Psychiatric/Behavioral:  Positive for suicidal ideas.   All other systems reviewed and are negative.  Updated Vital Signs BP (!) 150/109 (BP Location: Right Arm)   Pulse 99   Temp 97.7 F (36.5 C) (Oral)   Resp 18   Ht 5' 10 (1.778 m)   Wt 88.5 kg   SpO2 98%   BMI 27.98 kg/m   Physical Exam Vitals and nursing note reviewed.  Constitutional:      General: He is not in acute distress.    Appearance: Normal appearance. He is not ill-appearing.  HENT:     Head: Normocephalic and atraumatic.     Nose: Nose normal.     Mouth/Throat:     Mouth: Mucous membranes are moist.  Eyes:     Extraocular Movements: Extraocular movements intact.     Conjunctiva/sclera: Conjunctivae normal.     Pupils: Pupils are equal, round, and reactive to light.   Cardiovascular:     Rate and Rhythm: Normal rate and regular rhythm.     Pulses: Normal pulses.     Heart sounds: Normal heart sounds. No murmur heard.    No gallop.  Pulmonary:     Effort: Pulmonary effort is normal. No respiratory distress.     Breath sounds: Normal breath sounds. No stridor. No wheezing, rhonchi or rales.  Abdominal:     General: Abdomen is flat. Bowel sounds are normal. There is no distension.     Palpations: Abdomen is soft.     Tenderness: There is no abdominal tenderness. There is no guarding.  Musculoskeletal:        General: Normal range of motion.     Cervical back: Normal range of motion and neck supple. No rigidity or tenderness.  Skin:    General: Skin is warm and dry.  Neurological:     General: No focal deficit present.     Mental Status: He is alert and oriented to person, place, and time. Mental status is at baseline.     Cranial Nerves: No cranial nerve deficit.     Sensory: No sensory deficit.     Motor: No weakness.     Coordination: Coordination normal.     Gait: Gait normal.  Psychiatric:        Attention and Perception: He is attentive. He does not perceive auditory or visual hallucinations.        Mood and Affect: Mood is anxious and depressed. Mood is not elated. Affect is tearful. Affect is not labile, blunt, flat, angry or inappropriate.        Speech: He is communicative. Speech is not rapid and pressured, delayed, slurred or tangential.        Behavior: Behavior is not agitated, slowed, aggressive, withdrawn, hyperactive or combative.        Thought Content: Thought content is not paranoid or delusional. Thought content includes suicidal ideation. Thought content does not include homicidal ideation. Thought content includes suicidal plan. Thought content does not include homicidal plan.        Cognition and Memory: Cognition is not impaired. Memory is not impaired. He does not exhibit impaired recent memory or impaired remote memory.         Judgment: Judgment is not impulsive or inappropriate.     (all labs ordered are listed, but only abnormal results are displayed) Labs Reviewed  ETHANOL  URINE DRUG SCREEN  CBC WITH DIFFERENTIAL/PLATELET  ACETAMINOPHEN  LEVEL  SALICYLATE LEVEL  URINALYSIS, ROUTINE W REFLEX MICROSCOPIC  COMPREHENSIVE METABOLIC PANEL WITH GFR  TROPONIN T, HIGH SENSITIVITY    EKG: None  Radiology: No results  found.   Procedures   Medications Ordered in the ED  LORazepam  (ATIVAN ) tablet 1 mg (has no administration in time range)  lactated ringers  bolus 1,000 mL (has no administration in time range)  LORazepam  (ATIVAN ) tablet 1-4 mg (has no administration in time range)    Or  LORazepam  (ATIVAN ) injection 1-4 mg (has no administration in time range)  thiamine  (VITAMIN B1) tablet 100 mg (has no administration in time range)    Or  thiamine  (VITAMIN B1) injection 100 mg (has no administration in time range)  folic acid  (FOLVITE ) tablet 1 mg (has no administration in time range)  multivitamin with minerals tablet 1 tablet (has no administration in time range)                                    Medical Decision Making Patient is medically clear for psych dispo at this time.  He is resting comfortably with no acute complaints.  Patient has been evaluated by TTS and they are recommending inpatient management.  Awaiting placement at this time.  Amount and/or Complexity of Data Reviewed Labs: ordered.  Risk OTC drugs. Prescription drug management.        Final diagnoses:  None    ED Discharge Orders     None          Daralene Lonni BIRCH, PA-C 12/18/23 2232    Daralene Lonni BIRCH, PA-C 12/18/23 2344    Franklyn Sid SAILOR, MD 12/20/23 [phone]

## 2023-12-18 NOTE — ED Triage Notes (Signed)
 Pt bib [facility] with complains of suicidal thoughts. Pt is voluntary and would like to speak with someone about help. Has not been taking his meds like he is supposed to. Pt also has been complaining of chest pain and stressed.

## 2023-12-18 NOTE — Progress Notes (Signed)
 Patient has been denied by [facility] due to no appropriate beds available. Patient meets [facility] inpatient criteria per Cathaleen Adam, PMHNP. Patient has been faxed out to the following facilities:   [facility]  [address] [place] [place] [address] [phone]  [facility]  [address] [place] [address] [phone]  [facility]  [address] [place] [address] [phone]  [facility]  [address] [place], [place] [address] [phone]  [facility] Patient Placement  [facility] [place] [phone]  [facility]  [address] [place] [address] [phone]  [facility]  [address] [place] [address] [phone]  [facility] EFAX  [address] [place], [place] [phone]  [facility]  [address] [place] [address] [phone]  [facility]  [address], [place] [address] [phone]  [facility]  [address] [place] [address] [phone]  [facility]  [address] [place], [place] [address] [phone]  [facility]  [address] [place] [address] [phone]  [facility]  [address], [place] [address] [phone]  [facility]  [address] [place] [address] [phone]  [facility]  [address] [place] [address] [phone]  [facility]  [address], [place]  [place] [address] [phone]  [facility]  [address] [place] [address] [phone]   Bunnie Gallop, MSW, LCSW-A  5:37 PM 12/18/2023

## 2023-12-18 NOTE — Consult Note (Signed)
 [facility] Psychiatric Consult Initial  Patient Name: .[name]  MRN: [id number]  DOB: 1965-02-21  Consult Order details:  Orders (From admission, onward)     Start     Ordered   12/18/23 1629  CONSULT TO CALL ACT TEAM       Ordering Provider: Daralene Lonni BIRCH, PA-C  Provider:  (Not yet assigned)  Question:  Reason for Consult?  Answer:  Psych consult   12/18/23 1628   12/18/23 1629  IP CONSULT TO PSYCHIATRY       Ordering Provider: Daralene Lonni BIRCH, PA-C  Provider:  (Not yet assigned)  Question:  Reason for consult:  Answer:  Medication management   12/18/23 1628   12/18/23 1543  CONSULT TO CALL ACT TEAM       Ordering Provider: Daralene Lonni BIRCH, PA-C  Provider:  (Not yet assigned)  Question:  Reason for Consult?  Answer:  depression, SI   12/18/23 1542             Mode of Visit: In person    Psychiatry Consult Evaluation  Service Date: December 18, 2023 LOS:  LOS: 0 days  Chief Complaint "I just want to kill myself. I can't do this anymore."  Primary Psychiatric Diagnoses  Major depressive disorder, recurrent, severe without psychotic features  Generalized anxiety disorder  Alcohol  use disorder, severe, dependence  Assessment  [name] is a 58 y.o. male admitted: Presented to the EDfor 12/18/2023  3:30 PM for Suicidal ideation, depression, medication non-compliance.   58 year old male presenting with major depressive episode and suicidal ideation in the context of substance use, social isolation, medication non-adherence, and recent relationship loss. Patient exhibits active SI, hopelessness, and limited coping capacity. Given history of prior suicide attempt, recent relapse to substances, and poor support system, he is at high risk for self-harm and requires inpatient psychiatric admission for stabilization, medication management, and substance use evaluation. Please see plan below for detailed recommendations.   Diagnoses:  Active  Hospital problems: Principal Problem:   MDD (major depressive disorder), recurrent severe, without psychosis (HCC) Active Problems:   GAD (generalized anxiety disorder)    Plan   ## Psychiatric Medication Recommendations:  Continue home medications  ## Medical Decision Making Capacity: Not specifically addressed in this encounter  ## Further Work-up:  -- No further workup needed at this time EKG or UDS -- most recent EKG on 12/18/2023 had QtC of 437 -- Pertinent labwork reviewed earlier this admission includes: CBC, CMP, EKG, UDS   ## Disposition:-- We recommend inpatient psychiatric hospitalization when medically cleared. Patient is under voluntary admission status at this time; please IVC if attempts to leave hospital.  ## Behavioral / Environmental: -Difficult Patient (SELECT OPTIONS FROM BELOW), To minimize splitting of staff, assign one staff person to communicate all information from the team when feasible., or Utilize compassion and acknowledge the patient's experiences while setting clear and realistic expectations for care.    ## Safety and Observation Level:  - Based on my clinical evaluation, I estimate the patient to be at high risk of self harm in the current setting. - At this time, we recommend  1:1 Observation. This decision is based on my review of the chart including patient's history and current presentation, interview of the patient, mental status examination, and consideration of suicide risk including evaluating suicidal ideation, plan, intent, suicidal or self-harm behaviors, risk factors, and protective factors. This judgment is based on our ability to directly address suicide risk, implement suicide prevention strategies,  and develop a safety plan while the patient is in the clinical setting. Please contact our team if there is a concern that risk level has changed.  CSSR Risk Category:C-SSRS RISK CATEGORY: High Risk  Suicide Risk Assessment: Patient has  following modifiable risk factors for suicide: active suicidal ideation, under treated depression , medication noncompliance, and current symptoms: anxiety/panic, insomnia, impulsivity, anhedonia, hopelessness, which we are addressing by recommending inpatient psychiatric admission. Patient has following non-modifiable or demographic risk factors for suicide: male gender, history of suicide attempt, history of self harm behavior, and psychiatric hospitalization Patient has the following protective factors against suicide: Supportive friends  Thank you for this consult request. Recommendations have been communicated to the primary team.  We will continue to follow patient at this time.   CATHALEEN ADAM, PMHNP       History of Present Illness  Relevant Aspects of Hospital ED Course:  Admitted on 12/18/2023 for Suicidal ideation, depression, medication non-compliance  Patient Report:  Patient presents with worsening depression, anxiety, and suicidal thoughts for several months, intensified today. He reports significant psychosocial stressors including recent breakup with his girlfriend, who was his primary support and helped him obtain medications. Since the breakup, he has been isolated, financially strained, and non-compliant with medications for approximately six months (previously on Prozac , gabapentin , and Atarax ).  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 Reports no access to transportation due to prior DUIs.  He currently lives in a motel he cannot afford and receives a disability check that is insufficient to cover expenses. Reports daily alcohol  use (~ 3/5 bottle of liquor), marijuana use almost daily, and cocaine three times weekly.  Patient is tearful during evaluation, stating he feels "tired of living like this" but denies homicidal ideation,  hallucinations, or delusions. Reports decreased sleep, appetite, and motivation, with anhedonia, hopelessness, and worthlessness.  Last inpatient psychiatric admission was February 2025 at [facility] following a suicide attempt by cutting his wrist.  Past Psychiatric History Diagnoses: Major depressive disorder, anxiety disorder Prior hospitalizations: [facility] (Feb 2025) Outpatient provider: Recently followed by psychiatry but not engaged in 6 months Medications (non-compliant): Prozac , gabapentin , Atarax  Suicide attempts: Prior wrist-cutting attempt  Psych ROS:  Depression: Endorses Anxiety: Endorses Mania (lifetime and current): Denies Psychosis: (lifetime and current): Denies  Collateral information:  Contacted none per patient  Review of Systems  Psychiatric/Behavioral:  Positive for depression, substance abuse and suicidal ideas.      Psychiatric and Social History  Psychiatric History:  Information collected from patient and chart review  Prev Dx/Sx: Depression and anxiety Current Psych Provider: None reported Home Meds (current): Yes Previous Med Trials: Yes Therapy: Denies  Prior Psych Hospitalization: Yes Prior Self Harm: Yes Prior Violence: Denies  Family Psych History: Denies Family Hx suicide: Denied   Social History:  Developmental Hx: Deferred Educational Hx: Graduated high school Occupational Hx: Unemployed Legal Hx: Denies Living Situation: Lives in a motel Spiritual Hx: Yes Access to weapons/lethal means: Denies  Substance History Alcohol : Yes Type of alcohol  liquor Last Drink a couple days ago Number of drinks per day varies History of alcohol  withdrawal seizures denies History of DT's yes Tobacco: Yes Illicit drugs: Yes Prescription drug abuse: Denies Rehab hx: Denies  Exam Findings  Physical Exam:  Vital Signs:  Temp:  [97.7 F (36.5 C)] 97.7 F (36.5 C) (11/09 1538) Pulse Rate:  [99]  99 (11/09  1538) Resp:  [18] 18 (11/09 1538) BP: (150)/(109) 150/109 (11/09 1538) SpO2:  [98 %] 98 % (11/09 1538) Weight:  [88.5 kg] 88.5 kg (11/09 1540) Blood pressure (!) 150/109, pulse 99, temperature 97.7 F (36.5 C), temperature source Oral, resp. rate 18, height 5' 10 (1.778 m), weight 88.5 kg, SpO2 98%. Body mass index is 27.98 kg/m.  Physical Exam Vitals and nursing note reviewed. Exam conducted with a chaperone present.  Neurological:     Mental Status: He is alert.  Psychiatric:        Attention and Perception: Attention normal.        Mood and Affect: Mood is depressed. Affect is flat and tearful.        Speech: Speech normal.        Behavior: Behavior is cooperative.        Thought Content: Thought content includes suicidal ideation. Thought content includes suicidal plan.        Judgment: Judgment is impulsive.      Mental Status Exam: General Appearance: Disheveled; tearful  Orientation:  Full (Time, Place, and Person)  Memory:  Immediate;   Good Recent;   Fair Remote;   Fair  Concentration:  Concentration: Good and Attention Span: Good  Recall:  Good  Attention  Good  Eye Contact:  Good  Speech:  Pressured  Language:  Good  Volume:  Normal  Mood: Depressed, anxious  Affect:  Depressed, Flat, and Tearful  Thought Process:  Coherent  Thought Content:  Suicidal ideation with plan, denies HI, AVH, or delusions  Suicidal Thoughts:  Yes.  with intent/plan  Homicidal Thoughts:  No  Judgement:  Fair  Insight:  Fair  Psychomotor Activity:  Restlessness  Akathisia:  No  Fund of Knowledge:  Good    Assets:  Communication Skills Desire for Improvement Financial Resources/Insurance Social Support  Cognition:  WNL  ADL's:  Intact  AIMS (if indicated):       Other History   These have been pulled in through the EMR, reviewed, and updated if appropriate.  Family History:  The patient's family history includes Diabetes in his mother; Hypertension in his father and  mother; Stroke in his father.  Medical History: Past Medical History:  Diagnosis Date  . Acid reflux   . Acute encephalopathy 10/22/2021  . Acute medial meniscal tear, left, initial encounter   . Acute respiratory failure with hypoxia (HCC)   . AKI (acute kidney injury) 06/21/2018  . Alcohol  abuse 11/11/2022  . Alcohol  use disorder 11/11/2022  . Anxiety 12/21/2022  . Chronic hepatitis C (HCC) 12/17/2013  . Deep venous thrombosis (HCC)   . Depression 02/13/2022  . Discitis of lumbosacral region 06/28/2018  . Emphysema of lung (HCC)   . GERD (gastroesophageal reflux disease) 06/22/2018  . Head injury   . Hepatitis C    S/p treatment with Epclusa  in 2020  . Hiatal hernia   . HTN (hypertension)   . Polysubstance abuse (HCC) 06/24/2018  . Psoas abscess, right (HCC) 06/28/2018  . Pulmonary nodules   . Stimulant use disorder 11/11/2022  . Stroke (HCC)   . Suicidal ideation 11/11/2022    Surgical History: Past Surgical History:  Procedure Laterality Date  . ACROMIO-CLAVICULAR JOINT REPAIR Right 06/28/2018   Procedure: ACROMIO-CLAVICULAR JOINT IRRIGATION AND DEBRIDEMENT;  Surgeon: Addie Cordella Hamilton, MD;  Location: [facility] OR;  Service: Orthopedics;  Laterality: Right;  . FACIAL FRACTURE SURGERY    . IRRIGATION AND DEBRIDEMENT SHOULDER Right 06/28/2018  Procedure: IRRIGATION AND DEBRIDEMENT SHOULDER;  Surgeon: Addie Cordella Hamilton, MD;  Location: [facility] OR;  Service: Orthopedics;  Laterality: Right;  . KNEE ARTHROSCOPY Left 06/23/2018   Procedure: LEFT KNEE ARTHROSCOPY KNEE I&D.;  Surgeon: Addie Cordella Hamilton, MD;  Location: [facility] OR;  Service: Orthopedics;  Laterality: Left;     Medications:   Current Facility-Administered Medications:  .  folic acid  (FOLVITE ) tablet 1 mg, 1 mg, Oral, Daily, Rigney, Christopher D, PA-C, 1 mg at 12/18/23 1550 .  ibuprofen  (ADVIL ) tablet 600 mg, 600 mg, Oral, Q8H PRN, Rigney, Christopher D, PA-C .  LORazepam  (ATIVAN ) tablet 1-4 mg, 1-4 mg, Oral, Q1H PRN  **OR** LORazepam  (ATIVAN ) injection 1-4 mg, 1-4 mg, Intravenous, Q1H PRN, Rigney, Christopher D, PA-C .  multivitamin with minerals tablet 1 tablet, 1 tablet, Oral, Daily, Daralene Lonni BIRCH, PA-C, 1 tablet at 12/18/23 1550 .  thiamine  (VITAMIN B1) tablet 100 mg, 100 mg, Oral, Daily, 100 mg at 12/18/23 1550 **OR** thiamine  (VITAMIN B1) injection 100 mg, 100 mg, Intravenous, Daily, Rigney, Christopher D, PA-C  Current Outpatient Medications:  .  amLODipine  (NORVASC ) 10 MG tablet, Take 1 tablet (10 mg total) by mouth daily., Disp: 30 tablet, Rfl: 0 .  atorvastatin  (LIPITOR) 20 MG tablet, Take 1 tablet (20 mg total) by mouth daily for 14 days., Disp: 14 tablet, Rfl: 0 .  docusate sodium  (COLACE) 100 MG capsule, Take 1 capsule (100 mg total) by mouth daily., Disp: 10 capsule, Rfl: 0 .  FLUoxetine  (PROZAC ) 20 MG capsule, Take 3 capsules (60 mg total) by mouth daily., Disp: 30 capsule, Rfl: 3 .  folic acid  (FOLVITE ) 1 MG tablet, Take 1 tablet (1 mg total) by mouth daily., Disp: 30 tablet, Rfl: 0 .  gabapentin  (NEURONTIN ) 100 MG capsule, Take 1 capsule (100 mg total) by mouth 3 (three) times daily., Disp: 90 capsule, Rfl: 0 .  hydrOXYzine  (ATARAX ) 25 MG tablet, Take 1 tablet (25 mg total) by mouth every 8 (eight) hours as needed for anxiety (insomnia)., Disp: 30 tablet, Rfl: 0 .  melatonin 3 MG TABS tablet, Take 1 tablet (3 mg total) by mouth at bedtime., Disp: 30 tablet, Rfl: 0 .  methocarbamol  (ROBAXIN ) 750 MG tablet, Take 1 tablet (750 mg total) by mouth every 8 (eight) hours as needed for muscle spasms., Disp: 30 tablet, Rfl: 0 .  Multiple Vitamin (MULTIVITAMIN WITH MINERALS) TABS tablet, Take 1 tablet by mouth daily., Disp: 30 tablet, Rfl: 0 .  naltrexone  (DEPADE) 50 MG tablet, Take 1 tablet (50 mg total) by mouth daily., Disp: 30 tablet, Rfl: 0 .  naphazoline-glycerin  (CLEAR EYES REDNESS) 0.012-0.25 % SOLN, Place 1-2 drops into both eyes 4 (four) times daily as needed for eye irritation., Disp: 1  mL, Rfl: 0 .  naproxen  (NAPROSYN ) 500 MG tablet, Take 1 tablet (500 mg total) by mouth 2 (two) times daily., Disp: 30 tablet, Rfl: 0 .  pantoprazole  (PROTONIX ) 40 MG tablet, Take 1 tablet (40 mg total) by mouth daily., Disp: , Rfl:  .  VENTOLIN  HFA 108 (90 Base) MCG/ACT inhaler, Inhale 2 puffs into the lungs every 6 (six) hours as needed for wheezing or shortness of breath., Disp: 6.7 g, Rfl: 0  Allergies: No Known Allergies  Rodel Glaspy MOTLEY-MANGRUM, PMHNP

## 2023-12-19 NOTE — ED Notes (Signed)
 CSW added substance abuse counseling/ education resources to AVS. Clearing consult.

## 2023-12-19 NOTE — ED Notes (Signed)
 Attempted to call report to [facility].

## 2023-12-19 NOTE — ED Provider Notes (Signed)
 Emergency Medicine Observation Re-evaluation Note  [name] is a 58 y.o. male, seen on rounds today.  Pt initially presented to the ED for complaints of Suicidal Currently, the patient is awaiting placement for suicidal thoughts.  Physical Exam  BP (!) 165/104   Pulse 84   Temp 97.8 F (36.6 C)   Resp 18   Ht 5' 10 (1.778 m)   Wt 88.5 kg   SpO2 95%   BMI 27.98 kg/m  Physical Exam Awake and in no acute distress  ED Course / MDM  EKG:EKG Interpretation Date/Time:  Sunday December 18 2023 15:52:19 EST Ventricular Rate:  92 PR Interval:  144 QRS Duration:  88 QT Interval:  354 QTC Calculation: 437 R Axis:   11  Text Interpretation: Normal sinus rhythm Normal ECG Confirmed by Franklyn Gills [phone]) on 12/18/2023 5:44:24 PM  I have reviewed the labs performed to date as well as medications administered while in observation.  Recent changes in the last 24 hours include none.  Plan  Current plan is for psychiatric placement.    Suzette Pac, MD 12/19/23 [phone]

## 2023-12-19 NOTE — ED Notes (Signed)
 Report given to Publishing Rights Manager at [facility].  Clothing returned to patient.

## 2023-12-19 NOTE — Discharge Instructions (Addendum)
Transfer to [facility]

## 2023-12-19 NOTE — ED Notes (Signed)
 Pt made aware he was able to transfer.  Charge nurse made aware of the patient's refusal to go to [facility] due to not being returned to [place] in the past.

## 2023-12-19 NOTE — ED Notes (Signed)
 Safe transport called to transport patient to [facility] at this time. Nurse notified.

## 2023-12-27 ENCOUNTER — Other Ambulatory Visit (HOSPITAL_BASED_OUTPATIENT_CLINIC_OR_DEPARTMENT_OTHER): Payer: Self-pay

## 2023-12-27 ENCOUNTER — Other Ambulatory Visit: Payer: Self-pay

## 2023-12-27 MED ORDER — ATORVASTATIN CALCIUM 20 MG PO TABS
20.0000 mg | ORAL_TABLET | Freq: Every day | ORAL | 0 refills | Status: AC
  Filled 2023-12-27: qty 14, 14d supply, fill #0

## 2023-12-27 MED ORDER — PANTOPRAZOLE SODIUM 40 MG PO TBEC
40.0000 mg | DELAYED_RELEASE_TABLET | Freq: Every day | ORAL | 0 refills | Status: AC
Start: 2023-12-27 — End: ?
  Filled 2023-12-27: qty 14, 14d supply, fill #0

## 2023-12-27 MED ORDER — FLUOXETINE HCL 40 MG PO CAPS
80.0000 mg | ORAL_CAPSULE | Freq: Every day | ORAL | 0 refills | Status: AC
  Filled 2023-12-27: qty 28, 14d supply, fill #0

## 2023-12-27 MED ORDER — SUPER AYTINAL 50 PLUS PO TABS
1.0000 | ORAL_TABLET | Freq: Every day | ORAL | 0 refills | Status: AC
  Filled 2023-12-27: qty 14, 14d supply, fill #0

## 2023-12-27 MED ORDER — AMLODIPINE BESYLATE 10 MG PO TABS
10.0000 mg | ORAL_TABLET | Freq: Every day | ORAL | 0 refills | Status: AC
  Filled 2023-12-27: qty 14, 14d supply, fill #0

## 2023-12-27 MED ORDER — GABAPENTIN 100 MG PO CAPS
100.0000 mg | ORAL_CAPSULE | Freq: Three times a day (TID) | ORAL | 0 refills | Status: AC
  Filled 2023-12-27: qty 42, 14d supply, fill #0

## 2023-12-27 MED ORDER — CLONIDINE HCL 0.1 MG PO TABS
0.1000 mg | ORAL_TABLET | Freq: Two times a day (BID) | ORAL | 0 refills | Status: AC
  Filled 2023-12-27: qty 28, 14d supply, fill #0

## 2023-12-27 MED ORDER — HYDROXYZINE PAMOATE 50 MG PO CAPS
50.0000 mg | ORAL_CAPSULE | Freq: Four times a day (QID) | ORAL | 0 refills | Status: AC | PRN
  Filled 2023-12-27: qty 56, 14d supply, fill #0

## 2023-12-27 MED ORDER — QUETIAPINE FUMARATE 50 MG PO TABS
50.0000 mg | ORAL_TABLET | Freq: Every day | ORAL | 0 refills | Status: AC
  Filled 2023-12-27: qty 14, 14d supply, fill #0

## 2023-12-27 MED ORDER — FOLIC ACID 1 MG PO TABS
1.0000 mg | ORAL_TABLET | Freq: Every day | ORAL | 0 refills | Status: AC
  Filled 2023-12-27: qty 14, 14d supply, fill #0

## 2023-12-27 MED ORDER — VITAMIN B-1 100 MG PO TABS
100.0000 mg | ORAL_TABLET | Freq: Every day | ORAL | 0 refills | Status: AC
  Filled 2023-12-27: qty 14, 14d supply, fill #0

## 2023-12-28 ENCOUNTER — Other Ambulatory Visit: Payer: Self-pay

## 2023-12-28 ENCOUNTER — Other Ambulatory Visit (HOSPITAL_COMMUNITY): Payer: Self-pay

## 2023-12-28 ENCOUNTER — Other Ambulatory Visit (HOSPITAL_BASED_OUTPATIENT_CLINIC_OR_DEPARTMENT_OTHER): Payer: Self-pay
